# Patient Record
Sex: Female | Born: 1940
Health system: Southern US, Community
[De-identification: ages and names within clinical notes are randomized; demographics above are authoritative.]

## PROBLEM LIST (undated history)

## (undated) DIAGNOSIS — M549 Dorsalgia, unspecified: Secondary | ICD-10-CM

## (undated) DIAGNOSIS — I2510 Atherosclerotic heart disease of native coronary artery without angina pectoris: Secondary | ICD-10-CM

## (undated) DIAGNOSIS — R600 Localized edema: Secondary | ICD-10-CM

## (undated) DIAGNOSIS — R0600 Dyspnea, unspecified: Secondary | ICD-10-CM

## (undated) DIAGNOSIS — I639 Cerebral infarction, unspecified: Secondary | ICD-10-CM

## (undated) DIAGNOSIS — Z9289 Personal history of other medical treatment: Secondary | ICD-10-CM

## (undated) DIAGNOSIS — E039 Hypothyroidism, unspecified: Secondary | ICD-10-CM

## (undated) DIAGNOSIS — R1310 Dysphagia, unspecified: Secondary | ICD-10-CM

## (undated) DIAGNOSIS — I509 Heart failure, unspecified: Secondary | ICD-10-CM

## (undated) DIAGNOSIS — I5042 Chronic combined systolic (congestive) and diastolic (congestive) heart failure: Secondary | ICD-10-CM

## (undated) DIAGNOSIS — G4730 Sleep apnea, unspecified: Secondary | ICD-10-CM

## (undated) DIAGNOSIS — K219 Gastro-esophageal reflux disease without esophagitis: Secondary | ICD-10-CM

## (undated) DIAGNOSIS — I5030 Unspecified diastolic (congestive) heart failure: Secondary | ICD-10-CM

## (undated) DIAGNOSIS — G459 Transient cerebral ischemic attack, unspecified: Secondary | ICD-10-CM

## (undated) DIAGNOSIS — N289 Disorder of kidney and ureter, unspecified: Secondary | ICD-10-CM

## (undated) DIAGNOSIS — E079 Disorder of thyroid, unspecified: Secondary | ICD-10-CM

## (undated) DIAGNOSIS — I519 Heart disease, unspecified: Secondary | ICD-10-CM

## (undated) DIAGNOSIS — E119 Type 2 diabetes mellitus without complications: Secondary | ICD-10-CM

## (undated) DIAGNOSIS — M2550 Pain in unspecified joint: Secondary | ICD-10-CM

## (undated) DIAGNOSIS — I10 Essential (primary) hypertension: Secondary | ICD-10-CM

## (undated) HISTORY — DX: Dysphagia, unspecified: R13.10

## (undated) HISTORY — PX: JOINT REPLACEMENT: SHX530

## (undated) HISTORY — DX: Hypothyroidism, unspecified: E03.9

## (undated) HISTORY — DX: Gastro-esophageal reflux disease without esophagitis: K21.9

## (undated) HISTORY — PX: ESOPHAGOGASTRODUODENOSCOPY: SHX1529

## (undated) HISTORY — DX: Transient cerebral ischemic attack, unspecified: G45.9

## (undated) HISTORY — DX: Localized edema: R60.0

## (undated) HISTORY — PX: ABDOMINAL HYSTERECTOMY: SHX81

## (undated) HISTORY — DX: Disorder of kidney and ureter, unspecified: N28.9

## (undated) HISTORY — PX: HAND SURGERY: SHX662

## (undated) HISTORY — PX: THYROIDECTOMY: SHX17

## (undated) HISTORY — DX: Heart failure, unspecified: I50.9

## (undated) HISTORY — DX: Chronic combined systolic (congestive) and diastolic (congestive) heart failure: I50.42

## (undated) HISTORY — DX: Dyspnea, unspecified: R06.00

## (undated) HISTORY — DX: Cerebral infarction, unspecified: I63.9

## (undated) HISTORY — DX: Dorsalgia, unspecified: M54.9

## (undated) HISTORY — PX: CARDIAC CATHETERIZATION: SHX172

## (undated) HISTORY — DX: Pain in unspecified joint: M25.50

## (undated) HISTORY — DX: Heart disease, unspecified: I51.9

---

## 2013-02-27 DIAGNOSIS — M159 Polyosteoarthritis, unspecified: Secondary | ICD-10-CM | POA: Insufficient documentation

## 2013-02-27 DIAGNOSIS — M1990 Unspecified osteoarthritis, unspecified site: Secondary | ICD-10-CM | POA: Insufficient documentation

## 2013-02-28 DIAGNOSIS — R5381 Other malaise: Secondary | ICD-10-CM | POA: Insufficient documentation

## 2013-05-29 ENCOUNTER — Other Ambulatory Visit: Payer: Self-pay | Admitting: *Deleted

## 2013-05-29 DIAGNOSIS — R51 Headache: Secondary | ICD-10-CM

## 2013-05-29 DIAGNOSIS — M542 Cervicalgia: Secondary | ICD-10-CM

## 2013-05-30 ENCOUNTER — Other Ambulatory Visit: Payer: Self-pay

## 2013-05-30 ENCOUNTER — Ambulatory Visit
Admission: RE | Admit: 2013-05-30 | Discharge: 2013-05-30 | Disposition: A | Discharge status: Home / Self Care | Payer: Medicare Other | Source: Ambulatory Visit | Attending: *Deleted | Admitting: *Deleted

## 2013-05-30 DIAGNOSIS — R51 Headache: Secondary | ICD-10-CM

## 2013-05-30 DIAGNOSIS — M542 Cervicalgia: Secondary | ICD-10-CM

## 2013-06-05 ENCOUNTER — Other Ambulatory Visit: Payer: Self-pay

## 2013-10-29 ENCOUNTER — Other Ambulatory Visit: Payer: Self-pay | Admitting: Rheumatology

## 2013-10-29 ENCOUNTER — Ambulatory Visit
Admission: RE | Admit: 2013-10-29 | Discharge: 2013-10-29 | Disposition: A | Discharge status: Home / Self Care | Payer: Medicare Other | Source: Ambulatory Visit | Attending: Rheumatology | Admitting: Rheumatology

## 2013-10-29 DIAGNOSIS — M545 Low back pain, unspecified: Secondary | ICD-10-CM

## 2013-12-11 ENCOUNTER — Other Ambulatory Visit: Payer: Self-pay | Admitting: Rheumatology

## 2013-12-11 DIAGNOSIS — M545 Low back pain, unspecified: Secondary | ICD-10-CM

## 2013-12-20 ENCOUNTER — Ambulatory Visit
Admission: RE | Admit: 2013-12-20 | Discharge: 2013-12-20 | Disposition: A | Discharge status: Home / Self Care | Payer: Medicare Other | Source: Ambulatory Visit | Attending: Rheumatology | Admitting: Rheumatology

## 2013-12-20 DIAGNOSIS — M545 Low back pain, unspecified: Secondary | ICD-10-CM

## 2014-01-29 ENCOUNTER — Encounter (HOSPITAL_COMMUNITY): Payer: Self-pay | Admitting: Emergency Medicine

## 2014-01-29 ENCOUNTER — Emergency Department (INDEPENDENT_AMBULATORY_CARE_PROVIDER_SITE_OTHER): Payer: Medicare Other

## 2014-01-29 ENCOUNTER — Emergency Department (HOSPITAL_COMMUNITY)
Admission: EM | Admit: 2014-01-29 | Discharge: 2014-01-29 | Disposition: A | Discharge status: Home / Self Care | Payer: Medicare Other | Source: Home / Self Care | Attending: Emergency Medicine | Admitting: Emergency Medicine

## 2014-01-29 DIAGNOSIS — R1110 Vomiting, unspecified: Secondary | ICD-10-CM

## 2014-01-29 HISTORY — DX: Sleep apnea, unspecified: G47.30

## 2014-01-29 HISTORY — DX: Disorder of thyroid, unspecified: E07.9

## 2014-01-29 HISTORY — DX: Type 2 diabetes mellitus without complications: E11.9

## 2014-01-29 HISTORY — DX: Essential (primary) hypertension: I10

## 2014-01-29 LAB — POCT URINALYSIS DIP (DEVICE)
Bilirubin Urine: NEGATIVE
Glucose, UA: NEGATIVE mg/dL
Hgb urine dipstick: NEGATIVE
Ketones, ur: NEGATIVE mg/dL
Leukocytes, UA: NEGATIVE
Nitrite: NEGATIVE
Protein, ur: NEGATIVE mg/dL
Specific Gravity, Urine: 1.015 (ref 1.005–1.030)
Urobilinogen, UA: 0.2 mg/dL (ref 0.0–1.0)
pH: 6.5 (ref 5.0–8.0)

## 2014-01-29 LAB — OCCULT BLOOD, POC DEVICE: Fecal Occult Bld: NEGATIVE

## 2014-01-29 MED ORDER — ONDANSETRON HCL 4 MG PO TABS: 4.0000 mg | ORAL_TABLET | Freq: Three times a day (TID) | ORAL | Status: DC | PRN

## 2014-01-29 NOTE — ED Notes (Signed)
C/o 3-4 week duration of general abdominal pain w n/v . Had 3 episodes last PM. Denies pain at present, NAD

## 2014-01-29 NOTE — ED Provider Notes (Signed)
CSN: [id number]     Arrival date & time 01/29/14  1356 History   None    Chief Complaint  Patient presents with  . GI Problem   (Consider location/radiation/quality/duration/timing/severity/associated sxs/prior Treatment) HPI Comments: Patient is here in town visiting her daughter and presents to [facility] for GI issue that has occurred intermittently over the past one month. Patient reports that on four separate occasions, including the most recent episode last night, she had go to the restroom to have a bowel movement and as she is moving her bowels, she becomes very nauseated and then vomits.  Mentions having an occasional dark stool over past one month and periodic dysuria.  No fever, flank pain, diarrhea, constipation, or hematemesis. No hematuria, frequency or urgency.   Patient is a 73 y.o. female presenting with GI illness. The history is provided by the patient and a relative.  GI Problem Associated symptoms include abdominal pain. Pertinent negatives include no headaches.    Past Medical History  Diagnosis Date  . Diabetes mellitus without complication   . Hypertension   . Sleep apnea   . Thyroid disease    Past Surgical History  Procedure Laterality Date  . Abdominal hysterectomy    . Thyroidectomy     History reviewed. No pertinent family history. History  Substance Use Topics  . Smoking status: Never Smoker   . Smokeless tobacco: Not on file  . Alcohol Use: No   OB History   Grav Para Term Preterm Abortions TAB SAB Ect Mult Living                 Review of Systems  Constitutional: Negative.   HENT: Negative.   Eyes: Negative.   Respiratory: Negative.   Cardiovascular: Negative.   Gastrointestinal: Positive for nausea, vomiting, abdominal pain and blood in stool. Negative for diarrhea, constipation, abdominal distention, anal bleeding and rectal pain.  Genitourinary: Positive for dysuria. Negative for urgency, frequency, hematuria, flank pain, vaginal bleeding,  vaginal discharge, difficulty urinating, vaginal pain and pelvic pain.  Musculoskeletal: Negative.   Skin: Negative.   Neurological: Negative for headaches.    Allergies  Ace inhibitors; Amlodipine; Atenolol; Avandia; Darvon; Erythromycin; Hydrocodone; Percocet; and Spironolactone  Home Medications   Prior to Admission medications   Not on File   BP 178/78  Pulse 61  Temp(Src) 98 F (36.7 C) (Oral)  Resp 18  SpO2 98% Physical Exam  Nursing note and vitals reviewed. Constitutional: She is oriented to person, place, and time. She appears well-developed and well-nourished.  +obese  HENT:  Head: Normocephalic and atraumatic.  Eyes: Conjunctivae are normal. No scleral icterus.  Cardiovascular: Normal rate, regular rhythm and normal heart sounds.   Pulmonary/Chest: Effort normal and breath sounds normal.  Abdominal: Soft. Bowel sounds are normal. She exhibits no distension and no mass. There is no tenderness. There is no rebound and no guarding.  Exam limited by body habitus  Genitourinary: Guaiac negative stool.  Musculoskeletal: Normal range of motion.  Neurological: She is alert and oriented to person, place, and time.  Skin: Skin is warm and dry.  Psychiatric: She has a normal mood and affect. Her behavior is normal.    ED Course  Procedures (including critical care time) Labs Review Labs Reviewed  OCCULT BLOOD X 1 CARD TO LAB, STOOL  POCT URINALYSIS DIP (DEVICE)  OCCULT BLOOD, POC DEVICE    Results for orders placed during the hospital encounter of 01/29/14  POCT URINALYSIS DIP (DEVICE)  Result Value Ref Range   Glucose, UA NEGATIVE  NEGATIVE mg/dL   Bilirubin Urine NEGATIVE  NEGATIVE   Ketones, ur NEGATIVE  NEGATIVE mg/dL   Specific Gravity, Urine 1.015  1.005 - 1.030   Hgb urine dipstick NEGATIVE  NEGATIVE   pH 6.5  5.0 - 8.0   Protein, ur NEGATIVE  NEGATIVE mg/dL   Urobilinogen, UA 0.2  0.0 - 1.0 mg/dL   Nitrite NEGATIVE  NEGATIVE   Leukocytes, UA  NEGATIVE  NEGATIVE  OCCULT BLOOD, POC DEVICE      Result Value Ref Range   Fecal Occult Bld NEGATIVE  NEGATIVE   Imaging Review Dg Abd 2 Views  01/29/2014   CLINICAL DATA:  Abdominal pain and vomiting  EXAM: ABDOMEN - 2 VIEW  COMPARISON:  10/29/2013  FINDINGS: Scattered large and small bowel gas is noted. Fecal material is noted throughout the colon. Mild degenerative changes of the lumbar spine are seen. No abnormal mass or abnormal calcifications are noted.  IMPRESSION: No acute abnormality noted.   Electronically Signed   By: Inez Catalina M.D.   On: 01/29/2014 16:20     MDM   1. Vomiting    UA normal. Abdominal films unremarkable. Will provide Rx for zofran and advise follow up with PCP.   [place], [place] 01/29/14 [phone]

## 2014-01-29 NOTE — Discharge Instructions (Signed)
On today's exam you had no evidence of blood in your stool, your urine studies were normal and your xrays were normal. You may use medication as prescribed for your nausea and vomiting should it reoccur. I would also like for you to follow up with your doctor once you return home to [place], [place].   Nausea and Vomiting Nausea is a sick feeling that often comes before throwing up (vomiting). Vomiting is a reflex where stomach contents come out of your mouth. Vomiting can cause severe loss of body fluids (dehydration). Children and elderly adults can become dehydrated quickly, especially if they also have diarrhea. Nausea and vomiting are symptoms of a condition or disease. It is important to find the cause of your symptoms. CAUSES   Direct irritation of the stomach lining. This irritation can result from increased acid production (gastroesophageal reflux disease), infection, food poisoning, taking certain medicines (such as nonsteroidal anti-inflammatory drugs), alcohol use, or tobacco use.  Signals from the brain.These signals could be caused by a headache, heat exposure, an inner ear disturbance, increased pressure in the brain from injury, infection, a tumor, or a concussion, pain, emotional stimulus, or metabolic problems.  An obstruction in the gastrointestinal tract (bowel obstruction).  Illnesses such as diabetes, hepatitis, gallbladder problems, appendicitis, kidney problems, cancer, sepsis, atypical symptoms of a heart attack, or eating disorders.  Medical treatments such as chemotherapy and radiation.  Receiving medicine that makes you sleep (general anesthetic) during surgery. DIAGNOSIS Your caregiver may ask for tests to be done if the problems do not improve after a few days. Tests may also be done if symptoms are severe or if the reason for the nausea and vomiting is not clear. Tests may include:  Urine tests.  Blood tests.  Stool tests.  Cultures (to look for evidence of  infection).  X-rays or other imaging studies. Test results can help your caregiver make decisions about treatment or the need for additional tests. TREATMENT You need to stay well hydrated. Drink frequently but in small amounts.You may wish to drink water, sports drinks, clear broth, or eat frozen ice pops or gelatin dessert to help stay hydrated.When you eat, eating slowly may help prevent nausea.There are also some antinausea medicines that may help prevent nausea. HOME CARE INSTRUCTIONS   Take all medicine as directed by your caregiver.  If you do not have an appetite, do not force yourself to eat. However, you must continue to drink fluids.  If you have an appetite, eat a normal diet unless your caregiver tells you differently.  Eat a variety of complex carbohydrates (rice, wheat, potatoes, bread), lean meats, yogurt, fruits, and vegetables.  Avoid high-fat foods because they are more difficult to digest.  Drink enough water and fluids to keep your urine clear or pale yellow.  If you are dehydrated, ask your caregiver for specific rehydration instructions. Signs of dehydration may include:  Severe thirst.  Dry lips and mouth.  Dizziness.  Dark urine.  Decreasing urine frequency and amount.  Confusion.  Rapid breathing or pulse. SEEK IMMEDIATE MEDICAL CARE IF:   You have blood or brown flecks (like coffee grounds) in your vomit.  You have black or bloody stools.  You have a severe headache or stiff neck.  You are confused.  You have severe abdominal pain.  You have chest pain or trouble breathing.  You do not urinate at least once every 8 hours.  You develop cold or clammy skin.  You continue to vomit for longer than  24 to 48 hours.  You have a fever. MAKE SURE YOU:   Understand these instructions.  Will watch your condition.  Will get help right away if you are not doing well or get worse. Document Released: 10/03/2005 Document Revised: 12/26/2011  Document Reviewed: 03/02/2011 [facility] Patient Information 2014 [place], [place].

## 2014-01-30 NOTE — ED Provider Notes (Signed)
Medical screening examination/treatment/procedure(s) were performed by non-physician practitioner and as supervising physician I was immediately available for consultation/collaboration.  Philipp Deputy, M.D.   Harden Mo, MD 01/30/14 [phone]

## 2014-10-02 DIAGNOSIS — I2510 Atherosclerotic heart disease of native coronary artery without angina pectoris: Secondary | ICD-10-CM | POA: Insufficient documentation

## 2015-01-16 ENCOUNTER — Emergency Department (HOSPITAL_COMMUNITY): Payer: Medicare Other

## 2015-01-16 ENCOUNTER — Other Ambulatory Visit (HOSPITAL_COMMUNITY): Payer: Self-pay

## 2015-01-16 ENCOUNTER — Inpatient Hospital Stay (HOSPITAL_COMMUNITY)
Admission: EM | Admit: 2015-01-16 | Discharge: 2015-01-20 | DRG: 287 | Disposition: A | Discharge status: Home / Self Care | Payer: Medicare Other | Attending: Internal Medicine | Admitting: Internal Medicine

## 2015-01-16 ENCOUNTER — Encounter (HOSPITAL_COMMUNITY): Payer: Self-pay

## 2015-01-16 DIAGNOSIS — R0781 Pleurodynia: Secondary | ICD-10-CM

## 2015-01-16 DIAGNOSIS — R1310 Dysphagia, unspecified: Secondary | ICD-10-CM | POA: Diagnosis present

## 2015-01-16 DIAGNOSIS — Z7982 Long term (current) use of aspirin: Secondary | ICD-10-CM

## 2015-01-16 DIAGNOSIS — I129 Hypertensive chronic kidney disease with stage 1 through stage 4 chronic kidney disease, or unspecified chronic kidney disease: Secondary | ICD-10-CM | POA: Diagnosis present

## 2015-01-16 DIAGNOSIS — I25110 Atherosclerotic heart disease of native coronary artery with unstable angina pectoris: Secondary | ICD-10-CM | POA: Diagnosis not present

## 2015-01-16 DIAGNOSIS — N183 Chronic kidney disease, stage 3 unspecified: Secondary | ICD-10-CM | POA: Diagnosis not present

## 2015-01-16 DIAGNOSIS — K219 Gastro-esophageal reflux disease without esophagitis: Secondary | ICD-10-CM | POA: Diagnosis present

## 2015-01-16 DIAGNOSIS — Z79899 Other long term (current) drug therapy: Secondary | ICD-10-CM

## 2015-01-16 DIAGNOSIS — Z90710 Acquired absence of both cervix and uterus: Secondary | ICD-10-CM

## 2015-01-16 DIAGNOSIS — I2510 Atherosclerotic heart disease of native coronary artery without angina pectoris: Secondary | ICD-10-CM | POA: Diagnosis present

## 2015-01-16 DIAGNOSIS — E039 Hypothyroidism, unspecified: Secondary | ICD-10-CM | POA: Diagnosis present

## 2015-01-16 DIAGNOSIS — E890 Postprocedural hypothyroidism: Secondary | ICD-10-CM | POA: Diagnosis present

## 2015-01-16 DIAGNOSIS — E119 Type 2 diabetes mellitus without complications: Secondary | ICD-10-CM | POA: Diagnosis present

## 2015-01-16 DIAGNOSIS — K5900 Constipation, unspecified: Secondary | ICD-10-CM | POA: Diagnosis present

## 2015-01-16 DIAGNOSIS — Z9989 Dependence on other enabling machines and devices: Secondary | ICD-10-CM

## 2015-01-16 DIAGNOSIS — Z23 Encounter for immunization: Secondary | ICD-10-CM

## 2015-01-16 DIAGNOSIS — N179 Acute kidney failure, unspecified: Secondary | ICD-10-CM | POA: Diagnosis present

## 2015-01-16 DIAGNOSIS — Z888 Allergy status to other drugs, medicaments and biological substances status: Secondary | ICD-10-CM

## 2015-01-16 DIAGNOSIS — Z6841 Body Mass Index (BMI) 40.0 and over, adult: Secondary | ICD-10-CM

## 2015-01-16 DIAGNOSIS — Z8249 Family history of ischemic heart disease and other diseases of the circulatory system: Secondary | ICD-10-CM

## 2015-01-16 DIAGNOSIS — R0789 Other chest pain: Secondary | ICD-10-CM

## 2015-01-16 DIAGNOSIS — Z885 Allergy status to narcotic agent status: Secondary | ICD-10-CM

## 2015-01-16 DIAGNOSIS — Z881 Allergy status to other antibiotic agents status: Secondary | ICD-10-CM

## 2015-01-16 DIAGNOSIS — M549 Dorsalgia, unspecified: Secondary | ICD-10-CM | POA: Diagnosis present

## 2015-01-16 DIAGNOSIS — G4733 Obstructive sleep apnea (adult) (pediatric): Secondary | ICD-10-CM | POA: Diagnosis present

## 2015-01-16 DIAGNOSIS — I10 Essential (primary) hypertension: Secondary | ICD-10-CM | POA: Diagnosis present

## 2015-01-16 DIAGNOSIS — D649 Anemia, unspecified: Secondary | ICD-10-CM | POA: Diagnosis present

## 2015-01-16 HISTORY — DX: Atherosclerotic heart disease of native coronary artery without angina pectoris: I25.10

## 2015-01-16 HISTORY — DX: Personal history of other medical treatment: Z92.89

## 2015-01-16 LAB — COMPREHENSIVE METABOLIC PANEL
ALT: 19 U/L (ref 0–35)
AST: 23 U/L (ref 0–37)
Albumin: 3.6 g/dL (ref 3.5–5.2)
Alkaline Phosphatase: 84 U/L (ref 39–117)
Anion gap: 7 (ref 5–15)
BUN: 33 mg/dL — ABNORMAL HIGH (ref 6–23)
CO2: 26 mmol/L (ref 19–32)
Calcium: 9.1 mg/dL (ref 8.4–10.5)
Chloride: 105 mmol/L (ref 96–112)
Creatinine, Ser: 1.74 mg/dL — ABNORMAL HIGH (ref 0.50–1.10)
GFR calc Af Amer: 33 mL/min — ABNORMAL LOW (ref >90)
GFR calc non Af Amer: 28 mL/min — ABNORMAL LOW (ref >90)
Glucose, Bld: 136 mg/dL — ABNORMAL HIGH (ref 70–99)
Potassium: 4.1 mmol/L (ref 3.5–5.1)
Sodium: 138 mmol/L (ref 135–145)
Total Bilirubin: 0.3 mg/dL (ref 0.3–1.2)
Total Protein: 7.6 g/dL (ref 6.0–8.3)

## 2015-01-16 LAB — CBC
HCT: 32.3 % — ABNORMAL LOW (ref 36.0–46.0)
HCT: 30.9 % — ABNORMAL LOW (ref 36.0–46.0)
Hemoglobin: 10.3 g/dL — ABNORMAL LOW (ref 12.0–15.0)
Hemoglobin: 10.1 g/dL — ABNORMAL LOW (ref 12.0–15.0)
MCH: 27 pg (ref 26.0–34.0)
MCH: 27.8 pg (ref 26.0–34.0)
MCHC: 31.9 g/dL (ref 30.0–36.0)
MCHC: 32.7 g/dL (ref 30.0–36.0)
MCV: 84.8 fL (ref 78.0–100.0)
MCV: 85.1 fL (ref 78.0–100.0)
Platelets: 332 10*3/uL (ref 150–400)
Platelets: 291 10*3/uL (ref 150–400)
RBC: 3.81 MIL/uL — ABNORMAL LOW (ref 3.87–5.11)
RBC: 3.63 MIL/uL — ABNORMAL LOW (ref 3.87–5.11)
RDW: 14.3 % (ref 11.5–15.5)
RDW: 14.5 % (ref 11.5–15.5)
WBC: 6.1 10*3/uL (ref 4.0–10.5)
WBC: 7.7 10*3/uL (ref 4.0–10.5)

## 2015-01-16 LAB — CREATININE, SERUM
Creatinine, Ser: 1.72 mg/dL — ABNORMAL HIGH (ref 0.50–1.10)
GFR calc Af Amer: 33 mL/min — ABNORMAL LOW (ref >90)
GFR calc non Af Amer: 29 mL/min — ABNORMAL LOW (ref >90)

## 2015-01-16 LAB — MRSA PCR SCREENING: MRSA by PCR: NEGATIVE

## 2015-01-16 LAB — BRAIN NATRIURETIC PEPTIDE: B Natriuretic Peptide: 333.5 pg/mL — ABNORMAL HIGH (ref 0.0–100.0)

## 2015-01-16 LAB — TSH: TSH: 1.926 u[IU]/mL (ref 0.350–4.500)

## 2015-01-16 LAB — TROPONIN I
Troponin I: 0.06 ng/mL — ABNORMAL HIGH (ref <0.031)
Troponin I: 0.06 ng/mL — ABNORMAL HIGH (ref <0.031)

## 2015-01-16 LAB — I-STAT TROPONIN, ED: Troponin i, poc: 0.03 ng/mL (ref 0.00–0.08)

## 2015-01-16 LAB — GLUCOSE, CAPILLARY: Glucose-Capillary: 151 mg/dL — ABNORMAL HIGH (ref 70–99)

## 2015-01-16 MED ORDER — TERAZOSIN HCL 5 MG PO CAPS
5.0000 mg | ORAL_CAPSULE | Freq: Every day | ORAL | Status: DC
  Administered 2015-01-16 – 2015-01-19 (×4): 5 mg via ORAL
  Filled 2015-01-16 (×5): qty 1

## 2015-01-16 MED ORDER — NITROGLYCERIN 0.4 MG SL SUBL
0.4000 mg | SUBLINGUAL_TABLET | SUBLINGUAL | Status: DC | PRN
  Administered 2015-01-17: 0.4 mg via SUBLINGUAL
  Filled 2015-01-16: qty 1

## 2015-01-16 MED ORDER — ISOSORBIDE MONONITRATE ER 30 MG PO TB24
30.0000 mg | ORAL_TABLET | Freq: Every day | ORAL | Status: DC
Start: 2015-01-16 — End: 2015-01-19
  Administered 2015-01-16 – 2015-01-19 (×4): 30 mg via ORAL
  Filled 2015-01-16 (×4): qty 1

## 2015-01-16 MED ORDER — ASPIRIN 81 MG PO CHEW
81.0000 mg | CHEWABLE_TABLET | Freq: Every day | ORAL | Status: DC
  Administered 2015-01-17 – 2015-01-20 (×4): 81 mg via ORAL
  Filled 2015-01-16 (×4): qty 1

## 2015-01-16 MED ORDER — NITROGLYCERIN 2 % TD OINT
1.0000 [in_us] | TOPICAL_OINTMENT | Freq: Four times a day (QID) | TRANSDERMAL | Status: DC
  Filled 2015-01-16: qty 1

## 2015-01-16 MED ORDER — HEPARIN BOLUS VIA INFUSION
4000.0000 [IU] | Freq: Once | INTRAVENOUS | Status: AC
  Administered 2015-01-16: 4000 [IU] via INTRAVENOUS
  Filled 2015-01-16: qty 4000

## 2015-01-16 MED ORDER — PANTOPRAZOLE SODIUM 40 MG PO TBEC
40.0000 mg | DELAYED_RELEASE_TABLET | Freq: Every day | ORAL | Status: DC
  Administered 2015-01-16 – 2015-01-20 (×5): 40 mg via ORAL
  Filled 2015-01-16 (×5): qty 1

## 2015-01-16 MED ORDER — ONDANSETRON HCL 4 MG/2ML IJ SOLN
4.0000 mg | Freq: Four times a day (QID) | INTRAMUSCULAR | Status: DC | PRN
  Administered 2015-01-17 (×2): 4 mg via INTRAVENOUS
  Filled 2015-01-16 (×2): qty 2

## 2015-01-16 MED ORDER — HEPARIN BOLUS VIA INFUSION: 4000.0000 [IU] | Freq: Once | INTRAVENOUS | Status: DC

## 2015-01-16 MED ORDER — GABAPENTIN 400 MG PO CAPS
400.0000 mg | ORAL_CAPSULE | Freq: Every day | ORAL | Status: DC
  Administered 2015-01-16 – 2015-01-19 (×4): 400 mg via ORAL
  Filled 2015-01-16 (×4): qty 1

## 2015-01-16 MED ORDER — ACETAMINOPHEN 325 MG PO TABS
650.0000 mg | ORAL_TABLET | ORAL | Status: DC | PRN
  Administered 2015-01-16 – 2015-01-17 (×2): 650 mg via ORAL
  Filled 2015-01-16 (×2): qty 2

## 2015-01-16 MED ORDER — ASPIRIN 81 MG PO TABS
81.0000 mg | ORAL_TABLET | Freq: Every day | ORAL | Status: DC
Start: 2015-01-16 — End: 2015-01-16

## 2015-01-16 MED ORDER — LEVOTHYROXINE SODIUM 25 MCG PO TABS
125.0000 ug | ORAL_TABLET | Freq: Every day | ORAL | Status: DC
  Administered 2015-01-17 – 2015-01-20 (×4): 125 ug via ORAL
  Filled 2015-01-16 (×9): qty 1

## 2015-01-16 MED ORDER — INSULIN ASPART 100 UNIT/ML ~~LOC~~ SOLN
0.0000 [IU] | Freq: Three times a day (TID) | SUBCUTANEOUS | Status: DC
  Administered 2015-01-17: 3 [IU] via SUBCUTANEOUS
  Administered 2015-01-17: 5 [IU] via SUBCUTANEOUS
  Administered 2015-01-18 – 2015-01-19 (×3): 3 [IU] via SUBCUTANEOUS
  Administered 2015-01-19: 2 [IU] via SUBCUTANEOUS
  Administered 2015-01-20: 5 [IU] via SUBCUTANEOUS

## 2015-01-16 MED ORDER — INFLUENZA VAC SPLIT QUAD 0.5 ML IM SUSY
0.5000 mL | PREFILLED_SYRINGE | INTRAMUSCULAR | Status: AC
  Administered 2015-01-17: 0.5 mL via INTRAMUSCULAR
  Filled 2015-01-16 (×2): qty 0.5

## 2015-01-16 MED ORDER — MORPHINE SULFATE 4 MG/ML IJ SOLN
4.0000 mg | Freq: Once | INTRAMUSCULAR | Status: DC
Start: 2015-01-16 — End: 2015-01-17

## 2015-01-16 MED ORDER — HEPARIN (PORCINE) IN NACL 100-0.45 UNIT/ML-% IJ SOLN
1050.0000 [IU]/h | INTRAMUSCULAR | Status: DC
  Administered 2015-01-16: 1050 [IU]/h via INTRAVENOUS
  Filled 2015-01-16: qty 250

## 2015-01-16 MED ORDER — ACETAMINOPHEN 325 MG PO TABS
650.0000 mg | ORAL_TABLET | Freq: Once | ORAL | Status: AC
  Administered 2015-01-16: 650 mg via ORAL
  Filled 2015-01-16: qty 2

## 2015-01-16 MED ORDER — NITROGLYCERIN 0.4 MG SL SUBL: 0.4000 mg | SUBLINGUAL_TABLET | SUBLINGUAL | Status: DC | PRN

## 2015-01-16 MED ORDER — HEPARIN SODIUM (PORCINE) 5000 UNIT/ML IJ SOLN
5000.0000 [IU] | Freq: Three times a day (TID) | INTRAMUSCULAR | Status: DC
  Administered 2015-01-16 – 2015-01-18 (×7): 5000 [IU] via SUBCUTANEOUS
  Filled 2015-01-16 (×7): qty 1

## 2015-01-16 NOTE — ED Provider Notes (Signed)
CSN: [id number]     Arrival date & time 01/16/15  1243 History   First MD Initiated Contact with Patient 01/16/15 1313     Chief Complaint  Patient presents with  . Chest Pain     (Consider location/radiation/quality/duration/timing/severity/associated sxs/prior Treatment) HPI   74 year old female with history of diabetes, hypertension, and thyroid disease presents for evaluation of chest pain. Patient reports for the past 3 days she has had intermittent pleuritic chest pain. Pain is located across the mid chest, felt deep, worsening with movement or with coughing. She endorses occasional heart palpitation with heart racing and feeling lightheadedness. She also described chest pain as a pressure-like sensation. She endorsed nausea without vomiting. Her pain is currently 5 out of 10. Improves when she sits still. She has been taking 3 baby aspirin last night and today have provided minimal improvement to her chest discomfort. She denies any fever, URI symptoms, productive cough, hemoptysis, abdominal pain, back pain, vomiting or diarrhea, or rash. She denies any recent strenuous activities. She reported that her heart doctor is located in [place]. She felt that her last stress test approximately 2 years ago and was not told that there wasn't any significant finding.  Past Medical History  Diagnosis Date  . Diabetes mellitus without complication   . Hypertension   . Sleep apnea   . Thyroid disease    Past Surgical History  Procedure Laterality Date  . Abdominal hysterectomy    . Thyroidectomy     No family history on file. History  Substance Use Topics  . Smoking status: Never Smoker   . Smokeless tobacco: Not on file  . Alcohol Use: No   OB History    No data available     Review of Systems  All other systems reviewed and are negative.     Allergies  Ace inhibitors; Amlodipine; Atenolol; Avandia; Darvon; Erythromycin; Hydrocodone; Percocet; and Spironolactone  Home  Medications   Prior to Admission medications   Medication Sig Start Date End Date Taking? Authorizing Provider  aspirin 81 MG tablet Take 81 mg by mouth daily.    Historical Provider, MD  furosemide (LASIX) 40 MG tablet Take 40 mg by mouth.    Historical Provider, MD  gabapentin (NEURONTIN) 400 MG capsule Take 400 mg by mouth 3 (three) times daily.    Historical Provider, MD  levothyroxine (SYNTHROID, LEVOTHROID) 125 MCG tablet Take 125 mcg by mouth daily before breakfast.    Historical Provider, MD  losartan (COZAAR) 100 MG tablet Take 100 mg by mouth daily.    Historical Provider, MD  meclizine (ANTIVERT) 25 MG tablet Take 25 mg by mouth 3 (three) times daily as needed for dizziness.    Historical Provider, MD  omeprazole (PRILOSEC) 20 MG capsule Take 20 mg by mouth daily.    Historical Provider, MD  ondansetron (ZOFRAN) 4 MG tablet Take 1 tablet (4 mg total) by mouth every 8 (eight) hours as needed for nausea or vomiting. 01/29/14   Audelia Hives Presson, PA  terazosin (HYTRIN) 5 MG capsule Take 5 mg by mouth at bedtime.    Historical Provider, MD  Vitamin D, Ergocalciferol, (DRISDOL) 50000 UNITS CAPS capsule Take 50,000 Units by mouth every 7 (seven) days.    Historical Provider, MD   BP 171/83 mmHg  Pulse 81  Temp(Src) 98.8 F (37.1 C) (Oral)  Resp 18  SpO2 100% Physical Exam  Constitutional: She is oriented to person, place, and time. She appears well-developed and well-nourished. No distress.  Awake, alert, nontoxic appearance  HENT:  Head: Atraumatic.  Eyes: Conjunctivae are normal. Right eye exhibits no discharge. Left eye exhibits no discharge.  Neck: Neck supple.  Cardiovascular: Normal rate, regular rhythm and intact distal pulses.   Pulmonary/Chest: Effort normal and breath sounds normal. No respiratory distress. She exhibits no tenderness.  Abdominal: Soft. There is no tenderness. There is no rebound.  Musculoskeletal: Normal range of motion. She exhibits no edema or  tenderness.  ROM appears intact, no obvious focal weakness  Neurological: She is alert and oriented to person, place, and time.  Mental status and motor strength appears intact  Skin: Skin is warm. No rash noted.  Psychiatric: She has a normal mood and affect.  Nursing note and vitals reviewed.   ED Course  Procedures (including critical care time)  Patient presents with chest pain. Symptoms has been ongoing for 3 days. She is currently afebrile with stable vital sign.  Work up initiated.  Care discussed with Dr. Mingo Amber.    2:55 PM ECG without acute ischemic changes.  istat trop negative, however troponin I is elevated at 0.06.  Elevated BNP of 333.5, evidence of renal insufficiency with BUN 33, Cr 1.74. CXR unremarkable. Does have active CP.  Will give SL nitro, morphine and initiate heparin.  Care discussed with Dr. Mingo Amber.  3:08 PM i have consult cardiology who agrees to evaluate pt in ER.    4:26 PM Appreciate consult from Cardiology who will admit pt for further evaluation of her chest pain.    Labs Review Labs Reviewed  CBC - Abnormal; Notable for the following:    RBC 3.81 (*)    Hemoglobin 10.3 (*)    HCT 32.3 (*)    All other components within normal limits  BRAIN NATRIURETIC PEPTIDE - Abnormal; Notable for the following:    B Natriuretic Peptide 333.5 (*)    All other components within normal limits  COMPREHENSIVE METABOLIC PANEL - Abnormal; Notable for the following:    Glucose, Bld 136 (*)    BUN 33 (*)    Creatinine, Ser 1.74 (*)    GFR calc non Af Amer 28 (*)    GFR calc Af Amer 33 (*)    All other components within normal limits  TROPONIN I - Abnormal; Notable for the following:    Troponin I 0.06 (*)    All other components within normal limits  HEPARIN LEVEL (UNFRACTIONATED)  I-STAT TROPOININ, ED    Imaging Review Dg Chest 2 View  01/16/2015   CLINICAL DATA:  Chest pain with radiation to left arm intermittently for 1 week.  EXAM: CHEST  2 VIEW   COMPARISON:  None.  FINDINGS: Lungs are clear. Heart is slightly enlarged with pulmonary vascularity within normal limits. No adenopathy. No pneumothorax. No bone lesions. There is degenerative change in the thoracic spine.  IMPRESSION: Mild cardiomegaly.  No edema or consolidation.   Electronically Signed   By: Lowella Grip III M.D.   On: 01/16/2015 13:31     EKG Interpretation None      Date: 01/16/2015  Rate: 82  Rhythm: normal sinus rhythm with premature supraventricular complexes  QRS Axis: normal  Intervals: normal  ST/T Wave abnormalities: normal  Conduction Disutrbances: none  Narrative Interpretation:   Old EKG Reviewed: none for comparison     MDM   Final diagnoses:  Pleuritic chest pain    BP 159/43 mmHg  Pulse 65  Temp(Src) 98.8 F (37.1 C) (Oral)  Resp 16  Ht 5\' 2"  (1.575 m)  Wt 246 lb (111.585 kg)  BMI 44.98 kg/m2  SpO2 100%  I have reviewed nursing notes and vital signs. I personally reviewed the imaging tests through PACS system  I reviewed available ER/hospitalization records thought the EMR     Domenic Moras, PA-C 01/16/15 [place], MD 01/16/15 1705

## 2015-01-16 NOTE — ED Notes (Addendum)
Generalized chest pain began 3 days ago. Radiates into lt. Arm the pain is intermittent and movement increases the pain.  Sitting quietly decreases the pain  .   The pain is described as deep pain.   Skin is warm and dry.  Pt. Is having sob at times with mild nausea.  Pt. Does where a CPAP at home. ECG completed in Triage.  Pt. Also reports this am she felt palpatations.

## 2015-01-16 NOTE — Progress Notes (Signed)
Gave CHG bath via wipes, collected and sent MRSA swabs

## 2015-01-16 NOTE — Progress Notes (Signed)
Pt said she took three (3) 81mg  ASA at home prior to ED visit

## 2015-01-16 NOTE — Progress Notes (Signed)
ANTICOAGULATION CONSULT NOTE - Initial Consult  Pharmacy Consult for Heparin Indication: chest pain/ACS  Allergies  Allergen Reactions  . Ace Inhibitors   . Amlodipine   . Atenolol     bradycardia  . Avandia [Rosiglitazone]   . Darvon [Propoxyphene]   . Erythromycin Itching  . Hydralazine     Burning in throat and chest  . Hydrocodone   . Levofloxacin   . Percocet [Oxycodone-Acetaminophen]     hallucination  . Spironolactone   . Tramadol     unknown    Patient Measurements:  Ideal body weight: 50.1 kg  Heparin Dosing Weight: 78 kg   Vital Signs: Temp: 98.8 F (37.1 C) (04/01 1249) Temp Source: Oral (04/01 1249) BP: 150/59 mmHg (04/01 1445) Pulse Rate: 55 (04/01 1445)  Labs:  Recent Labs  01/16/15 1309  HGB 10.3*  HCT 32.3*  PLT 332  CREATININE 1.74*  TROPONINI 0.06*    CrCl cannot be calculated (Unknown ideal weight.).   Medical History: Past Medical History  Diagnosis Date  . Diabetes mellitus without complication   . Hypertension   . Sleep apnea   . Thyroid disease     Medications:   (Not in a hospital admission)  Assessment: 86 YOF who presented to the ED for chest pain x 3 days. ECG without acute ischemic changes. Initial troponin is mildly elevated at 0.06. H/H is low but Plt wnl. Unsure of baseline. Patient reports that she weighs ~ 246 lbs and that she has no history of bleeding. She was not on any anticoagulation prior to admission   Goal of Therapy:  Heparin level 0.3-0.7 units/ml Monitor platelets by anticoagulation protocol: Yes   Plan:  -Give heparin 4000 units bolus followed by heparin infusion at 1050 units/hr -F/u 8 hr HL  -Monitor daily HL, CBC and s/s of bleeding  Albertina Parr, PharmD., BCPS Clinical Pharmacist Pager [phone]

## 2015-01-16 NOTE — H&P (Signed)
Cardiologist:  [facility])  Regina [name] is an 74 y.o. female.   Chief Complaint: Chest Pain HPI:  The patient is a 74 yo female with a PMH of minimal-nonobstructive CAD by cath in 2011, CKD stage III, DM, HTN, OSA, hypothyroidism-SP thyroidectomy.  Her cardiologist is Dr. Benetta Spar who she saw this last December.  She is compliant with CPAP.  She reports CP for the last week.  She says it's worse when talking and with a breath/coughing.  Today it was 9/10.  Associated with nausea, SOB, diaphoresis, racing heart, dizzy, chills.  She had some LEE which is better.  She been having some hot flashes.  She denies orthopnea, abd pain, fever.   Medications:  Medication Sig  aspirin 81 MG tablet Take 81 mg by mouth daily.  Cholecalciferol (VITAMIN D3) 5000 UNITS CAPS Take 5,000 Units by mouth daily at 12 noon.  cyclobenzaprine (FLEXERIL) 10 MG tablet Take 10 mg by mouth at bedtime as needed for muscle spasms.   gabapentin (NEURONTIN) 400 MG capsule Take 400 mg by mouth at bedtime.   hydrochlorothiazide (HYDRODIURIL) 25 MG tablet Take 25 mg by mouth daily.  lactulose (CHRONULAC) 10 GM/15ML solution Take 20 g by mouth daily.  levothyroxine (SYNTHROID, LEVOTHROID) 125 MCG tablet Take 125 mcg by mouth daily before breakfast.  losartan (COZAAR) 100 MG tablet Take 100 mg by mouth daily.  meclizine (ANTIVERT) 25 MG tablet Take 25 mg by mouth 3 (three) times daily as needed for dizziness.  meloxicam (MOBIC) 15 MG tablet Take 15 mg by mouth daily at 12 noon.  omeprazole (PRILOSEC) 20 MG capsule Take 20 mg by mouth daily.  potassium chloride (K-DUR) 10 MEQ tablet Take 10 mEq by mouth 3 (three) times a week. Take on Monday, Wednesday, and Saturday  terazosin (HYTRIN) 5 MG capsule Take 5 mg by mouth at bedtime.  ondansetron (ZOFRAN) 4 MG tablet Take 1 tablet (4 mg total) by mouth every 8 (eight) hours as needed for nausea or vomiting. Patient not taking: Reported on 01/16/2015     Past  Medical History  Diagnosis Date  . Diabetes mellitus without complication   . Hypertension   . Sleep apnea   . Thyroid disease     Past Surgical History  Procedure Laterality Date  . Abdominal hysterectomy    . Thyroidectomy      No family history on file. Social History:  reports that she has never smoked. She does not have any smokeless tobacco history on file. She reports that she does not drink alcohol. Her drug history is not on file.  Allergies:  Allergies  Allergen Reactions  . Ace Inhibitors   . Amlodipine   . Atenolol     bradycardia  . Avandia [Rosiglitazone]   . Darvon [Propoxyphene]   . Erythromycin Itching  . Hydralazine     Burning in throat and chest  . Hydrocodone   . Levofloxacin   . Percocet [Oxycodone-Acetaminophen]     hallucination  . Spironolactone   . Tramadol     unknown     (Not in a hospital admission)  Results for orders placed or performed during the hospital encounter of 01/16/15 (from the past 48 hour(s))  CBC     Status: Abnormal   Collection Time: 01/16/15  1:09 PM  Result Value Ref Range   WBC 6.1 4.0 - 10.5 K/uL   RBC 3.81 (L) 3.87 - 5.11 MIL/uL   Hemoglobin 10.3 (L) 12.0 - 15.0 g/dL  HCT 32.3 (L) 36.0 - 46.0 %   MCV 84.8 78.0 - 100.0 fL   MCH 27.0 26.0 - 34.0 pg   MCHC 31.9 30.0 - 36.0 g/dL   RDW 14.3 11.5 - 15.5 %   Platelets 332 150 - 400 K/uL  Brain natriuretic peptide (order if patient c/o SOB ONLY)     Status: Abnormal   Collection Time: 01/16/15  1:09 PM  Result Value Ref Range   B Natriuretic Peptide 333.5 (H) 0.0 - 100.0 pg/mL  Comprehensive metabolic panel     Status: Abnormal   Collection Time: 01/16/15  1:09 PM  Result Value Ref Range   Sodium 138 135 - 145 mmol/L   Potassium 4.1 3.5 - 5.1 mmol/L   Chloride 105 96 - 112 mmol/L   CO2 26 19 - 32 mmol/L   Glucose, Bld 136 (H) 70 - 99 mg/dL   BUN 33 (H) 6 - 23 mg/dL   Creatinine, Ser 1.74 (H) 0.50 - 1.10 mg/dL   Calcium 9.1 8.4 - 10.5 mg/dL   Total Protein  7.6 6.0 - 8.3 g/dL   Albumin 3.6 3.5 - 5.2 g/dL   AST 23 0 - 37 U/L   ALT 19 0 - 35 U/L   Alkaline Phosphatase 84 39 - 117 U/L   Total Bilirubin 0.3 0.3 - 1.2 mg/dL   GFR calc non Af Amer 28 (L) >90 mL/min   GFR calc Af Amer 33 (L) >90 mL/min    Comment: (NOTE) The eGFR has been calculated using the CKD EPI equation. This calculation has not been validated in all clinical situations. eGFR's persistently <90 mL/min signify possible Chronic Kidney Disease.    Anion gap 7 5 - 15  Troponin I     Status: Abnormal   Collection Time: 01/16/15  1:09 PM  Result Value Ref Range   Troponin I 0.06 (H) <0.031 ng/mL    Comment:        PERSISTENTLY INCREASED TROPONIN VALUES IN THE RANGE OF 0.04-0.49 ng/mL CAN BE SEEN IN:       -UNSTABLE ANGINA       -CONGESTIVE HEART FAILURE       -MYOCARDITIS       -CHEST TRAUMA       -ARRYHTHMIAS       -LATE PRESENTING MYOCARDIAL INFARCTION       -COPD   CLINICAL FOLLOW-UP RECOMMENDED.   I-stat troponin, ED (not at [facility])     Status: None   Collection Time: 01/16/15  1:17 PM  Result Value Ref Range   Troponin i, poc 0.03 0.00 - 0.08 ng/mL   Comment 3            Comment: Due to the release kinetics of cTnI, a negative result within the first hours of the onset of symptoms does not rule out myocardial infarction with certainty. If myocardial infarction is still suspected, repeat the test at appropriate intervals.    Dg Chest 2 View  01/16/2015   CLINICAL DATA:  Chest pain with radiation to left arm intermittently for 1 week.  EXAM: CHEST  2 VIEW  COMPARISON:  None.  FINDINGS: Lungs are clear. Heart is slightly enlarged with pulmonary vascularity within normal limits. No adenopathy. No pneumothorax. No bone lesions. There is degenerative change in the thoracic spine.  IMPRESSION: Mild cardiomegaly.  No edema or consolidation.   Electronically Signed   By: Lowella Grip III M.D.   On: 01/16/2015 13:31    Review of Systems  Constitutional: Positive  for chills and diaphoresis. Negative for fever.  HENT: Negative for congestion and sore throat.   Respiratory: Positive for shortness of breath. Negative for cough and wheezing.   Cardiovascular: Positive for chest pain, palpitations and leg swelling. Negative for orthopnea and PND.  Gastrointestinal: Positive for nausea. Negative for vomiting, abdominal pain, blood in stool and melena.  Musculoskeletal: Positive for back pain.  Neurological: Positive for dizziness.  All other systems reviewed and are negative.   Blood pressure 159/43, pulse 65, temperature 98.8 F (37.1 C), temperature source Oral, resp. rate 16, height 5' 2"  (1.575 m), weight 246 lb (111.585 kg), SpO2 100 %. Physical Exam  Nursing note and vitals reviewed. Constitutional: She is oriented to person, place, and time. She appears well-developed. No distress.  Obese  HENT:  Head: Normocephalic and atraumatic.  Mouth/Throat: No oropharyngeal exudate.  Eyes: EOM are normal. Pupils are equal, round, and reactive to light. No scleral icterus.  Neck: Normal range of motion. Neck supple. No JVD present.  Cardiovascular: Normal rate, regular rhythm, S1 normal and S2 normal.  Exam reveals no friction rub.   No murmur heard. Pulses:      Radial pulses are 2+ on the right side, and 2+ on the left side.       Dorsalis pedis pulses are 2+ on the right side, and 2+ on the left side.  Respiratory: Effort normal and breath sounds normal. She exhibits tenderness (left toward shoulder).  GI: Soft. Bowel sounds are normal. She exhibits no distension. There is no tenderness.  Musculoskeletal: She exhibits no edema.  Lymphadenopathy:    She has no cervical adenopathy.  Neurological: She is alert and oriented to person, place, and time. She exhibits normal muscle tone.  Skin: Skin is warm and dry.  Psychiatric: She has a normal mood and affect.     Assessment/Plan Principal Problem:   Pleuritic chest pain Active Problems:   Acute  renal failure superimposed on stage 3 chronic kidney disease   Obesity, Class III, BMI 40-49.9 (morbid obesity)   Essential hypertension   Hypothyroidism   OSA on CPAP   DM type 2 (diabetes mellitus, type 2)   Plan Admit to stepdown as she is still having CP which is pleuritic and reproducible.  Will give ultram for pain.  Cycle troponin.  If it goes up significantly, then reevaluate need for ischemic WU.  Nothing for now besides echo.  Hydrate with 39m/HR NS for a liter.  Monitor I/O's.  Check echocardiogram.  Stop losartan and mobic due to worsening CKD.  Baseline appears to be ~1.50.  SS insulin, CBG ACHS.   CPAP tonight for OSA.    HTarri Fuller PLafayette General Medical Center4/10/2014, 3:16 PM  Patient seen and examined with DMelina Copa PA-C. We discussed all aspects of the encounter. I agree with the assessment and plan as stated above. CP likely costochrondritis. However troponin is minimally elevated. Will keep over night for pain control (we offered ultram but she says she just wants XS Tylenol) and rule out MI. Will check echo and Lexiscan Myoview.   DBenay Spice4:34 PM

## 2015-01-17 ENCOUNTER — Observation Stay (HOSPITAL_COMMUNITY): Payer: Medicare Other

## 2015-01-17 DIAGNOSIS — G4733 Obstructive sleep apnea (adult) (pediatric): Secondary | ICD-10-CM | POA: Diagnosis not present

## 2015-01-17 DIAGNOSIS — E890 Postprocedural hypothyroidism: Secondary | ICD-10-CM | POA: Diagnosis not present

## 2015-01-17 DIAGNOSIS — R0781 Pleurodynia: Secondary | ICD-10-CM | POA: Diagnosis present

## 2015-01-17 DIAGNOSIS — M549 Dorsalgia, unspecified: Secondary | ICD-10-CM | POA: Diagnosis not present

## 2015-01-17 DIAGNOSIS — I25110 Atherosclerotic heart disease of native coronary artery with unstable angina pectoris: Secondary | ICD-10-CM | POA: Diagnosis not present

## 2015-01-17 DIAGNOSIS — D649 Anemia, unspecified: Secondary | ICD-10-CM | POA: Diagnosis not present

## 2015-01-17 DIAGNOSIS — Z23 Encounter for immunization: Secondary | ICD-10-CM | POA: Diagnosis not present

## 2015-01-17 DIAGNOSIS — Z7982 Long term (current) use of aspirin: Secondary | ICD-10-CM | POA: Diagnosis not present

## 2015-01-17 DIAGNOSIS — Z885 Allergy status to narcotic agent status: Secondary | ICD-10-CM | POA: Diagnosis not present

## 2015-01-17 DIAGNOSIS — Z90710 Acquired absence of both cervix and uterus: Secondary | ICD-10-CM | POA: Diagnosis not present

## 2015-01-17 DIAGNOSIS — Z8249 Family history of ischemic heart disease and other diseases of the circulatory system: Secondary | ICD-10-CM | POA: Diagnosis not present

## 2015-01-17 DIAGNOSIS — K219 Gastro-esophageal reflux disease without esophagitis: Secondary | ICD-10-CM | POA: Diagnosis not present

## 2015-01-17 DIAGNOSIS — N179 Acute kidney failure, unspecified: Secondary | ICD-10-CM | POA: Diagnosis not present

## 2015-01-17 DIAGNOSIS — R9439 Abnormal result of other cardiovascular function study: Secondary | ICD-10-CM | POA: Diagnosis not present

## 2015-01-17 DIAGNOSIS — R079 Chest pain, unspecified: Secondary | ICD-10-CM

## 2015-01-17 DIAGNOSIS — I129 Hypertensive chronic kidney disease with stage 1 through stage 4 chronic kidney disease, or unspecified chronic kidney disease: Secondary | ICD-10-CM | POA: Diagnosis not present

## 2015-01-17 DIAGNOSIS — R1310 Dysphagia, unspecified: Secondary | ICD-10-CM | POA: Diagnosis not present

## 2015-01-17 DIAGNOSIS — K5900 Constipation, unspecified: Secondary | ICD-10-CM | POA: Diagnosis not present

## 2015-01-17 DIAGNOSIS — Z888 Allergy status to other drugs, medicaments and biological substances status: Secondary | ICD-10-CM | POA: Diagnosis not present

## 2015-01-17 DIAGNOSIS — Z881 Allergy status to other antibiotic agents status: Secondary | ICD-10-CM | POA: Diagnosis not present

## 2015-01-17 DIAGNOSIS — Z79899 Other long term (current) drug therapy: Secondary | ICD-10-CM | POA: Diagnosis not present

## 2015-01-17 DIAGNOSIS — N183 Chronic kidney disease, stage 3 (moderate): Secondary | ICD-10-CM | POA: Diagnosis not present

## 2015-01-17 DIAGNOSIS — Z6841 Body Mass Index (BMI) 40.0 and over, adult: Secondary | ICD-10-CM | POA: Diagnosis not present

## 2015-01-17 DIAGNOSIS — E119 Type 2 diabetes mellitus without complications: Secondary | ICD-10-CM | POA: Diagnosis not present

## 2015-01-17 LAB — GLUCOSE, CAPILLARY
Glucose-Capillary: 103 mg/dL — ABNORMAL HIGH (ref 70–99)
Glucose-Capillary: 161 mg/dL — ABNORMAL HIGH (ref 70–99)
Glucose-Capillary: 224 mg/dL — ABNORMAL HIGH (ref 70–99)
Glucose-Capillary: 126 mg/dL — ABNORMAL HIGH (ref 70–99)

## 2015-01-17 LAB — BASIC METABOLIC PANEL
Anion gap: 7 (ref 5–15)
BUN: 34 mg/dL — ABNORMAL HIGH (ref 6–23)
CO2: 26 mmol/L (ref 19–32)
Calcium: 8.4 mg/dL (ref 8.4–10.5)
Chloride: 105 mmol/L (ref 96–112)
Creatinine, Ser: 1.87 mg/dL — ABNORMAL HIGH (ref 0.50–1.10)
GFR calc Af Amer: 30 mL/min — ABNORMAL LOW (ref >90)
GFR calc non Af Amer: 26 mL/min — ABNORMAL LOW (ref >90)
Glucose, Bld: 114 mg/dL — ABNORMAL HIGH (ref 70–99)
Potassium: 4.3 mmol/L (ref 3.5–5.1)
Sodium: 138 mmol/L (ref 135–145)

## 2015-01-17 LAB — CBC
HCT: 28.1 % — ABNORMAL LOW (ref 36.0–46.0)
Hemoglobin: 9.3 g/dL — ABNORMAL LOW (ref 12.0–15.0)
MCH: 28 pg (ref 26.0–34.0)
MCHC: 33.1 g/dL (ref 30.0–36.0)
MCV: 84.6 fL (ref 78.0–100.0)
Platelets: 283 10*3/uL (ref 150–400)
RBC: 3.32 MIL/uL — ABNORMAL LOW (ref 3.87–5.11)
RDW: 14.5 % (ref 11.5–15.5)
WBC: 6.3 10*3/uL (ref 4.0–10.5)

## 2015-01-17 LAB — TROPONIN I
Troponin I: 0.06 ng/mL — ABNORMAL HIGH (ref <0.031)
Troponin I: 0.05 ng/mL — ABNORMAL HIGH (ref <0.031)

## 2015-01-17 LAB — LIPID PANEL
Cholesterol: 122 mg/dL (ref 0–200)
HDL: 39 mg/dL — ABNORMAL LOW (ref >39)
LDL Cholesterol: 56 mg/dL (ref 0–99)
Total CHOL/HDL Ratio: 3.1 RATIO
Triglycerides: 133 mg/dL (ref <150)
VLDL: 27 mg/dL (ref 0–40)

## 2015-01-17 MED ORDER — GI COCKTAIL ~~LOC~~
30.0000 mL | Freq: Once | ORAL | Status: AC
  Administered 2015-01-17: 30 mL via ORAL
  Filled 2015-01-17: qty 30

## 2015-01-17 MED ORDER — DICLOFENAC SODIUM 1 % TD GEL
4.0000 g | Freq: Four times a day (QID) | TRANSDERMAL | Status: DC | PRN
  Administered 2015-01-17: 4 g via TOPICAL
  Filled 2015-01-17: qty 100

## 2015-01-17 MED ORDER — MORPHINE SULFATE 2 MG/ML IJ SOLN
1.0000 mg | INTRAMUSCULAR | Status: DC | PRN
  Administered 2015-01-17 (×2): 1 mg via INTRAVENOUS
  Filled 2015-01-17 (×3): qty 1

## 2015-01-17 MED ORDER — TECHNETIUM TC 99M SESTAMIBI - CARDIOLITE
30.0000 | Freq: Once | INTRAVENOUS | Status: AC | PRN
  Administered 2015-01-17: 12:00:00 30 via INTRAVENOUS

## 2015-01-17 MED ORDER — REGADENOSON 0.4 MG/5ML IV SOLN
INTRAVENOUS | Status: AC
Start: 2015-01-17 — End: 2015-01-17
  Filled 2015-01-17: qty 5

## 2015-01-17 MED ORDER — TECHNETIUM TC 99M SESTAMIBI GENERIC - CARDIOLITE
10.0000 | Freq: Once | INTRAVENOUS | Status: AC | PRN
  Administered 2015-01-17: 10 via INTRAVENOUS

## 2015-01-17 MED ORDER — REGADENOSON 0.4 MG/5ML IV SOLN
0.4000 mg | Freq: Once | INTRAVENOUS | Status: AC
  Administered 2015-01-17: 0.4 mg via INTRAVENOUS
  Filled 2015-01-17: qty 5

## 2015-01-17 NOTE — Progress Notes (Signed)
Discussed with patient and her family regarding abnormal myoview result. Will tentatively place her on cath schedule on Monday. Will discuss again with attending in AM.  Her renal insufficiency will require IVF hydration prior to cath. Risk and benefit include bleeding, renal injury, vascular injury, arrhythmia, MI, stroke, loss of life or limb has been discussed with patient's daughter at the request of the patient who fears she cannot remember it all.  Hilbert Corrigan PA Pager: [phone]

## 2015-01-17 NOTE — Progress Notes (Signed)
Patient Name: Regina [name] Date of Encounter: 01/17/2015  Cardiologist: [place])   Principal Problem:   Pleuritic chest pain Active Problems:   Acute renal failure superimposed on stage 3 chronic kidney disease   Obesity, Class III, BMI 40-49.9 (morbid obesity)   Essential hypertension   Hypothyroidism   OSA on CPAP   DM type 2 (diabetes mellitus, type 2)    SUBJECTIVE  Continue to have intermittent CP, which has been going on for a week. Also started having R sided back pain since last night. No fever or chill, no burning sensation when urinate.  CURRENT MEDS . aspirin  81 mg Oral Daily  . gabapentin  400 mg Oral QHS  . heparin  5,000 Units Subcutaneous 3 times per day  . Influenza vac split quadrivalent PF  0.5 mL Intramuscular Tomorrow-1000  . insulin aspart  0-15 Units Subcutaneous TID WC  . isosorbide mononitrate  30 mg Oral Daily  . levothyroxine  125 mcg Oral QAC breakfast  .  morphine injection  4 mg Intravenous Once  . pantoprazole  40 mg Oral Daily  . terazosin  5 mg Oral QHS    OBJECTIVE  Filed Vitals:   01/16/15 2206 01/17/15 0024 01/17/15 0430 01/17/15 0810  BP:  138/51 138/46 159/72  Pulse: 66 57 67 67  Temp:  98.3 F (36.8 C) 98.4 F (36.9 C) 98 F (36.7 C)  TempSrc:  Axillary Axillary Oral  Resp: 17 18 18 18   Height:      Weight:   244 lb 6.4 oz (110.859 kg)   SpO2: 100% 99% 96% 98%   No intake or output data in the 24 hours ending 01/17/15 0950 Filed Weights   01/16/15 1500 01/16/15 2000 01/17/15 0430  Weight: 246 lb (111.585 kg) 244 lb 6.4 oz (110.859 kg) 244 lb 6.4 oz (110.859 kg)    PHYSICAL EXAM  General: Pleasant, NAD. Neuro: Alert and oriented X 3. Moves all extremities spontaneously. Psych: Normal affect. HEENT:  Normal  Neck: Supple without bruits or JVD. Lungs:  Resp regular and unlabored, CTA. Heart: RRR no s3, s4, or murmurs. Abdomen: Soft, non-tender, non-distended, BS + x 4.  Extremities: No  clubbing, cyanosis or edema. DP/PT/Radials 2+ and equal bilaterally.  Accessory Clinical Findings  CBC  Recent Labs  01/16/15 2018 01/17/15 0146  WBC 7.7 6.3  HGB 10.1* 9.3*  HCT 30.9* 28.1*  MCV 85.1 84.6  PLT 291 [id number]   Basic Metabolic Panel  Recent Labs  01/16/15 1309 01/16/15 2018 01/17/15 0146  NA 138  --  138  K 4.1  --  4.3  CL 105  --  105  CO2 26  --  26  GLUCOSE 136*  --  114*  BUN 33*  --  34*  CREATININE 1.74* 1.72* 1.87*  CALCIUM 9.1  --  8.4   Liver Function Tests  Recent Labs  01/16/15 1309  AST 23  ALT 19  ALKPHOS 84  BILITOT 0.3  PROT 7.6  ALBUMIN 3.6   Cardiac Enzymes  Recent Labs  01/16/15 2018 01/17/15 0146 01/17/15 0724  TROPONINI 0.06* 0.06* 0.05*   Fasting Lipid Panel  Recent Labs  01/17/15 0146  CHOL 122  HDL 39*  LDLCALC 56  TRIG 133  CHOLHDL 3.1   Thyroid Function Tests  Recent Labs  01/16/15 2018  TSH 1.926    TELE NSR with HR 50-60s    ECG  NSR with no significant ST-T wave changes  Echocardiogram  pending    Radiology/Studies  Dg Chest 2 View  01/16/2015   CLINICAL DATA:  Chest pain with radiation to left arm intermittently for 1 week.  EXAM: CHEST  2 VIEW  COMPARISON:  None.  FINDINGS: Lungs are clear. Heart is slightly enlarged with pulmonary vascularity within normal limits. No adenopathy. No pneumothorax. No bone lesions. There is degenerative change in the thoracic spine.  IMPRESSION: Mild cardiomegaly.  No edema or consolidation.   Electronically Signed   By: Lowella Grip III M.D.   On: 01/16/2015 13:31    ASSESSMENT AND PLAN  1. Atypical chest pain   - mildly elevated trop likely related to renal insufficiency  - will arrange inpatient myoview today  2. minimal-nonobstructive CAD by cath in 2011 3. CKD stage III 4. DM 5. HTN 6. OSA 7. hypothyroidism-SP thyroidectomy   Signed, Almyra Deforest PA-C Pager: [phone]   History and all data above reviewed.  Patient examined.  I  agree with the findings as above.  Still with chest pain  The patient exam reveals COR:RRR  ,  Lungs: Clear  ,  Abd: Positive bowel sounds, no rebound no guarding, Ext No edema  .  All available labs, radiology testing, previous records reviewed. Agree with documented assessment and plan. Doubt cardiac.  I will give a GI cocktail.  I will wait for the results of the [place].  Might need GI work up.    James Hochrein  1:00 PM  01/17/2015

## 2015-01-17 NOTE — Progress Notes (Signed)
Patient refuses CPAP, is dissatisfied with hospital provided machine and mask.  Patient uses nasal pillows at home.  Patient will have family member bring her machine from home for use tomorrow night.

## 2015-01-17 NOTE — Progress Notes (Signed)
Lexiscan stress test completed without complication. Pending final result by [facility] radiology  Signed, Almyra Deforest PA Pager: [phone]

## 2015-01-17 NOTE — Progress Notes (Signed)
UR completed 

## 2015-01-18 DIAGNOSIS — I2510 Atherosclerotic heart disease of native coronary artery without angina pectoris: Secondary | ICD-10-CM | POA: Diagnosis not present

## 2015-01-18 DIAGNOSIS — R0781 Pleurodynia: Secondary | ICD-10-CM | POA: Diagnosis present

## 2015-01-18 DIAGNOSIS — M549 Dorsalgia, unspecified: Secondary | ICD-10-CM | POA: Diagnosis present

## 2015-01-18 DIAGNOSIS — N179 Acute kidney failure, unspecified: Secondary | ICD-10-CM | POA: Diagnosis present

## 2015-01-18 DIAGNOSIS — E890 Postprocedural hypothyroidism: Secondary | ICD-10-CM | POA: Diagnosis present

## 2015-01-18 DIAGNOSIS — Z881 Allergy status to other antibiotic agents status: Secondary | ICD-10-CM | POA: Diagnosis not present

## 2015-01-18 DIAGNOSIS — Z7982 Long term (current) use of aspirin: Secondary | ICD-10-CM | POA: Diagnosis not present

## 2015-01-18 DIAGNOSIS — Z8249 Family history of ischemic heart disease and other diseases of the circulatory system: Secondary | ICD-10-CM | POA: Diagnosis not present

## 2015-01-18 DIAGNOSIS — K219 Gastro-esophageal reflux disease without esophagitis: Secondary | ICD-10-CM | POA: Diagnosis present

## 2015-01-18 DIAGNOSIS — R1310 Dysphagia, unspecified: Secondary | ICD-10-CM | POA: Diagnosis present

## 2015-01-18 DIAGNOSIS — Z6841 Body Mass Index (BMI) 40.0 and over, adult: Secondary | ICD-10-CM | POA: Diagnosis not present

## 2015-01-18 DIAGNOSIS — D649 Anemia, unspecified: Secondary | ICD-10-CM | POA: Diagnosis present

## 2015-01-18 DIAGNOSIS — Z79899 Other long term (current) drug therapy: Secondary | ICD-10-CM | POA: Diagnosis not present

## 2015-01-18 DIAGNOSIS — N183 Chronic kidney disease, stage 3 (moderate): Secondary | ICD-10-CM | POA: Diagnosis present

## 2015-01-18 DIAGNOSIS — Z23 Encounter for immunization: Secondary | ICD-10-CM | POA: Diagnosis not present

## 2015-01-18 DIAGNOSIS — N189 Chronic kidney disease, unspecified: Secondary | ICD-10-CM | POA: Diagnosis not present

## 2015-01-18 DIAGNOSIS — E119 Type 2 diabetes mellitus without complications: Secondary | ICD-10-CM | POA: Diagnosis present

## 2015-01-18 DIAGNOSIS — R9439 Abnormal result of other cardiovascular function study: Secondary | ICD-10-CM | POA: Diagnosis not present

## 2015-01-18 DIAGNOSIS — Z888 Allergy status to other drugs, medicaments and biological substances status: Secondary | ICD-10-CM | POA: Diagnosis not present

## 2015-01-18 DIAGNOSIS — I10 Essential (primary) hypertension: Secondary | ICD-10-CM | POA: Diagnosis not present

## 2015-01-18 DIAGNOSIS — I25110 Atherosclerotic heart disease of native coronary artery with unstable angina pectoris: Secondary | ICD-10-CM | POA: Diagnosis present

## 2015-01-18 DIAGNOSIS — I129 Hypertensive chronic kidney disease with stage 1 through stage 4 chronic kidney disease, or unspecified chronic kidney disease: Secondary | ICD-10-CM | POA: Diagnosis present

## 2015-01-18 DIAGNOSIS — K5900 Constipation, unspecified: Secondary | ICD-10-CM | POA: Diagnosis present

## 2015-01-18 DIAGNOSIS — Z885 Allergy status to narcotic agent status: Secondary | ICD-10-CM | POA: Diagnosis not present

## 2015-01-18 DIAGNOSIS — G4733 Obstructive sleep apnea (adult) (pediatric): Secondary | ICD-10-CM | POA: Diagnosis present

## 2015-01-18 DIAGNOSIS — R079 Chest pain, unspecified: Secondary | ICD-10-CM | POA: Diagnosis not present

## 2015-01-18 DIAGNOSIS — Z90710 Acquired absence of both cervix and uterus: Secondary | ICD-10-CM | POA: Diagnosis not present

## 2015-01-18 LAB — CBC
HCT: 30.3 % — ABNORMAL LOW (ref 36.0–46.0)
Hemoglobin: 9.7 g/dL — ABNORMAL LOW (ref 12.0–15.0)
MCH: 27.3 pg (ref 26.0–34.0)
MCHC: 32 g/dL (ref 30.0–36.0)
MCV: 85.4 fL (ref 78.0–100.0)
Platelets: 300 10*3/uL (ref 150–400)
RBC: 3.55 MIL/uL — ABNORMAL LOW (ref 3.87–5.11)
RDW: 14.6 % (ref 11.5–15.5)
WBC: 5.8 10*3/uL (ref 4.0–10.5)

## 2015-01-18 LAB — GLUCOSE, CAPILLARY
Glucose-Capillary: 117 mg/dL — ABNORMAL HIGH (ref 70–99)
Glucose-Capillary: 152 mg/dL — ABNORMAL HIGH (ref 70–99)
Glucose-Capillary: 162 mg/dL — ABNORMAL HIGH (ref 70–99)
Glucose-Capillary: 188 mg/dL — ABNORMAL HIGH (ref 70–99)

## 2015-01-18 MED ORDER — DIPHENHYDRAMINE HCL 25 MG PO CAPS
25.0000 mg | ORAL_CAPSULE | Freq: Once | ORAL | Status: AC
  Administered 2015-01-18: 25 mg via ORAL
  Filled 2015-01-18: qty 1

## 2015-01-18 MED ORDER — SODIUM CHLORIDE 0.9 % IV SOLN
1.0000 mL/kg/h | INTRAVENOUS | Status: DC
  Administered 2015-01-19: 1 mL/kg/h via INTRAVENOUS

## 2015-01-18 MED ORDER — SODIUM CHLORIDE 0.9 % IV SOLN: 250.0000 mL | INTRAVENOUS | Status: DC | PRN

## 2015-01-18 MED ORDER — SODIUM CHLORIDE 0.9 % IJ SOLN: 3.0000 mL | INTRAMUSCULAR | Status: DC | PRN

## 2015-01-18 MED ORDER — SODIUM CHLORIDE 0.9 % IJ SOLN
3.0000 mL | Freq: Two times a day (BID) | INTRAMUSCULAR | Status: DC
  Administered 2015-01-19 (×2): 3 mL via INTRAVENOUS

## 2015-01-18 NOTE — Progress Notes (Signed)
Pt is complaining of back pain sub scapular on right lateral side. Using heat packs but hurts with sudden exertion. Pain is non radiating

## 2015-01-18 NOTE — Progress Notes (Signed)
  Echocardiogram 2D Echocardiogram has been performed.  Darlina Sicilian M 01/18/2015, 3:03 PM

## 2015-01-18 NOTE — Progress Notes (Signed)
Placed patient on home CPAP unit for the night.  

## 2015-01-18 NOTE — Progress Notes (Signed)
    SUBJECTIVE:  She complains of back pain.  This is a small point on the low right back   PHYSICAL EXAM Filed Vitals:   01/17/15 1201 01/17/15 2000 01/18/15 0000 01/18/15 0400  BP: 157/37 153/57 141/47 135/54  Pulse: 78 69 74 64  Temp:  98.7 F (37.1 C) 98.5 F (36.9 C) 98.5 F (36.9 C)  TempSrc:  Oral Oral Oral  Resp:  20 18 18   Height:      Weight:    242 lb 7.7 oz (109.988 kg)  SpO2:  100% 100% 98%   General:  Clear Lungs:  Clear Heart:  RRR Abdomen:  Positive bowel sounds, no rebound no guarding Extremities:  No edema  LABS: Lab Results  Component Value Date   TROPONINI 0.05* 01/17/2015   Results for orders placed or performed during the hospital encounter of 01/16/15 (from the past 24 hour(s))  Glucose, capillary     Status: Abnormal   Collection Time: 01/17/15  1:46 PM  Result Value Ref Range   Glucose-Capillary 161 (H) 70 - 99 mg/dL  Glucose, capillary     Status: Abnormal   Collection Time: 01/17/15  5:08 PM  Result Value Ref Range   Glucose-Capillary 224 (H) 70 - 99 mg/dL  Glucose, capillary     Status: Abnormal   Collection Time: 01/17/15  9:37 PM  Result Value Ref Range   Glucose-Capillary 126 (H) 70 - 99 mg/dL   Comment 1 Notify RN   CBC     Status: Abnormal   Collection Time: 01/18/15  4:35 AM  Result Value Ref Range   WBC 5.8 4.0 - 10.5 K/uL   RBC 3.55 (L) 3.87 - 5.11 MIL/uL   Hemoglobin 9.7 (L) 12.0 - 15.0 g/dL   HCT 30.3 (L) 36.0 - 46.0 %   MCV 85.4 78.0 - 100.0 fL   MCH 27.3 26.0 - 34.0 pg   MCHC 32.0 30.0 - 36.0 g/dL   RDW 14.6 11.5 - 15.5 %   Platelets 300 150 - 400 K/uL   No intake or output data in the 24 hours ending 01/18/15 0853   ASSESSMENT AND PLAN:  CHEST PAIN:  She has had ongoing pain and inferior wall ischemia with an intermediate risk Lexiscan Myoview.  She had borderline troponin.  She will need a cardiac cath.  The patient understands that risks included but are not limited to stroke (1 in 1000), death (1 in 33),  kidney failure [usually temporary] (1 in 500), bleeding (1 in 200), allergic reaction [possibly serious] (1 in 200).  The patient understands and agrees to proceed.   She should not have an LV gram and needs limited dye.  I will hydrate starting tonightn.  Long discussion with the daughter and patient about this plan and her kidneys.  They understand the risk to her kidneys is higher than stated about but that we limit dye and hydrate.   CKD:  Creat is up slightly.   As above.  DM:  Glucose OK.  Continue current therapy  HTN:  BP is slightly high.  Continue current therapy.    Jeneen Rinks [facility] 01/18/2015 8:53 AM

## 2015-01-19 ENCOUNTER — Encounter (HOSPITAL_COMMUNITY): Payer: Self-pay | Admitting: Interventional Cardiology

## 2015-01-19 ENCOUNTER — Encounter (HOSPITAL_COMMUNITY)
Admission: EM | Disposition: A | Discharge status: Home / Self Care | Payer: Self-pay | Source: Home / Self Care | Attending: Internal Medicine

## 2015-01-19 DIAGNOSIS — N189 Chronic kidney disease, unspecified: Secondary | ICD-10-CM

## 2015-01-19 DIAGNOSIS — I2510 Atherosclerotic heart disease of native coronary artery without angina pectoris: Secondary | ICD-10-CM

## 2015-01-19 HISTORY — PX: LEFT HEART CATHETERIZATION WITH CORONARY ANGIOGRAM: SHX5451

## 2015-01-19 LAB — BASIC METABOLIC PANEL
Anion gap: 8 (ref 5–15)
BUN: 32 mg/dL — ABNORMAL HIGH (ref 6–23)
CO2: 28 mmol/L (ref 19–32)
Calcium: 8.6 mg/dL (ref 8.4–10.5)
Chloride: 102 mmol/L (ref 96–112)
Creatinine, Ser: 1.7 mg/dL — ABNORMAL HIGH (ref 0.50–1.10)
GFR calc Af Amer: 34 mL/min — ABNORMAL LOW (ref >90)
GFR calc non Af Amer: 29 mL/min — ABNORMAL LOW (ref >90)
Glucose, Bld: 143 mg/dL — ABNORMAL HIGH (ref 70–99)
Potassium: 4.4 mmol/L (ref 3.5–5.1)
Sodium: 138 mmol/L (ref 135–145)

## 2015-01-19 LAB — CBC
HCT: 28.8 % — ABNORMAL LOW (ref 36.0–46.0)
Hemoglobin: 9.3 g/dL — ABNORMAL LOW (ref 12.0–15.0)
MCH: 27.6 pg (ref 26.0–34.0)
MCHC: 32.3 g/dL (ref 30.0–36.0)
MCV: 85.5 fL (ref 78.0–100.0)
Platelets: 309 10*3/uL (ref 150–400)
RBC: 3.37 MIL/uL — ABNORMAL LOW (ref 3.87–5.11)
RDW: 14.4 % (ref 11.5–15.5)
WBC: 6.4 10*3/uL (ref 4.0–10.5)

## 2015-01-19 LAB — GLUCOSE, CAPILLARY
Glucose-Capillary: 136 mg/dL — ABNORMAL HIGH (ref 70–99)
Glucose-Capillary: 118 mg/dL — ABNORMAL HIGH (ref 70–99)
Glucose-Capillary: 183 mg/dL — ABNORMAL HIGH (ref 70–99)
Glucose-Capillary: 156 mg/dL — ABNORMAL HIGH (ref 70–99)

## 2015-01-19 LAB — HEMOGLOBIN A1C
Hgb A1c MFr Bld: 7.4 % — ABNORMAL HIGH (ref 4.8–5.6)
Mean Plasma Glucose: 166 mg/dL

## 2015-01-19 LAB — PROTIME-INR
INR: 1.2 (ref 0.00–1.49)
Prothrombin Time: 15.3 seconds — ABNORMAL HIGH (ref 11.6–15.2)

## 2015-01-19 SURGERY — LEFT HEART CATHETERIZATION WITH CORONARY ANGIOGRAM
Anesthesia: LOCAL

## 2015-01-19 MED ORDER — LACTULOSE 10 GM/15ML PO SOLN
20.0000 g | Freq: Every day | ORAL | Status: DC
  Administered 2015-01-19: 20 g via ORAL
  Filled 2015-01-19 (×2): qty 30

## 2015-01-19 MED ORDER — HEPARIN SODIUM (PORCINE) 1000 UNIT/ML IJ SOLN
INTRAMUSCULAR | Status: AC
  Filled 2015-01-19: qty 1

## 2015-01-19 MED ORDER — LIDOCAINE HCL (PF) 1 % IJ SOLN
INTRAMUSCULAR | Status: AC
  Filled 2015-01-19: qty 30

## 2015-01-19 MED ORDER — HEPARIN (PORCINE) IN NACL 2-0.9 UNIT/ML-% IJ SOLN
INTRAMUSCULAR | Status: AC
  Filled 2015-01-19: qty 1500

## 2015-01-19 MED ORDER — FENTANYL CITRATE 0.05 MG/ML IJ SOLN
INTRAMUSCULAR | Status: AC
  Filled 2015-01-19: qty 2

## 2015-01-19 MED ORDER — SODIUM CHLORIDE 0.9 % IV SOLN
INTRAVENOUS | Status: DC
  Administered 2015-01-19: 21:00:00 via INTRAVENOUS

## 2015-01-19 MED ORDER — ONDANSETRON HCL 4 MG/2ML IJ SOLN: 4.0000 mg | Freq: Four times a day (QID) | INTRAMUSCULAR | Status: DC | PRN

## 2015-01-19 MED ORDER — HEPARIN SODIUM (PORCINE) 5000 UNIT/ML IJ SOLN
5000.0000 [IU] | Freq: Three times a day (TID) | INTRAMUSCULAR | Status: DC
  Administered 2015-01-19 – 2015-01-20 (×2): 5000 [IU] via SUBCUTANEOUS
  Filled 2015-01-19 (×2): qty 1

## 2015-01-19 MED ORDER — SODIUM CHLORIDE 0.9 % IV SOLN
INTRAVENOUS | Status: AC
  Administered 2015-01-19: 12:00:00 via INTRAVENOUS

## 2015-01-19 MED ORDER — NITROGLYCERIN 1 MG/10 ML FOR IR/CATH LAB
INTRA_ARTERIAL | Status: AC
  Filled 2015-01-19: qty 10

## 2015-01-19 MED ORDER — MIDAZOLAM HCL 2 MG/2ML IJ SOLN
INTRAMUSCULAR | Status: AC
  Filled 2015-01-19: qty 2

## 2015-01-19 MED ORDER — ATORVASTATIN CALCIUM 10 MG PO TABS
10.0000 mg | ORAL_TABLET | Freq: Every day | ORAL | Status: DC
  Administered 2015-01-19 – 2015-01-20 (×2): 10 mg via ORAL
  Filled 2015-01-19 (×2): qty 1

## 2015-01-19 MED ORDER — DIPHENHYDRAMINE HCL 25 MG PO CAPS
25.0000 mg | ORAL_CAPSULE | Freq: Four times a day (QID) | ORAL | Status: DC | PRN
  Administered 2015-01-19: 25 mg via ORAL
  Filled 2015-01-19: qty 1

## 2015-01-19 MED ORDER — VERAPAMIL HCL 2.5 MG/ML IV SOLN
INTRAVENOUS | Status: AC
Start: 2015-01-19 — End: 2015-01-19
  Filled 2015-01-19: qty 2

## 2015-01-19 NOTE — Interval H&P Note (Signed)
Cath Lab Visit (complete for each Cath Lab visit)  Clinical Evaluation Leading to the Procedure:   ACS: Yes.    Non-ACS:    Anginal Classification: CCS III  Anti-ischemic medical therapy: Minimal Therapy (1 class of medications)  Non-Invasive Test Results: Intermediate-risk stress test findings: cardiac mortality 1-3%/year  Prior CABG: No previous CABG      History and Physical Interval Note:  01/19/2015 8:09 AM  Regina [name]  has presented today for surgery, with the diagnosis of unstable angina,  The various methods of treatment have been discussed with the patient and family. After consideration of risks, benefits and other options for treatment, the patient has consented to  Procedure(s): LEFT HEART CATHETERIZATION WITH CORONARY ANGIOGRAM (N/A) as a surgical intervention .  The patient's history has been reviewed, patient examined, no change in status, stable for surgery.  I have reviewed the patient's chart and labs.  Questions were answered to the patient's satisfaction.     Sinclair Grooms

## 2015-01-19 NOTE — Progress Notes (Signed)
Spoke with Cards fellow about pt having slight itching at cardiac leads and ID bracelet. Received and administered a PO Benadryl order PRN. Dose given last night was highly effective.

## 2015-01-19 NOTE — CV Procedure (Signed)
     Left Heart Catheterization with Coronary Angiography  Report  Regina [name]  74 y.o.  female 11-28-1940  Procedure Date: 01/19/2015 Referring Physician: Haroldine Laws, MD Primary Cardiologist: Hochrein  INDICATIONS: Intermediate risk myocardial perfusion study, trace elevated almost, and multiple risk factors for coronary artery disease. Of note 2 prior angiograms have not revealed significant disease.  PROCEDURE: 1. Left heart catheterization; 2. Coronary angiography; 3. Left RRR is not performed because of kidney impairment  CONSENT:  The risks, benefits, and details of the procedure were explained in detail to the patient. Risks including death, stroke, heart attack, kidney injury, allergy, limb ischemia, bleeding and radiation injury were discussed.  The patient verbalized understanding and wanted to proceed.  Informed written consent was obtained.  PROCEDURE TECHNIQUE:  After Xylocaine anesthesia a 5 French Slender sheath was placed in the right radial artery with an angiocath and the modified Seldinger technique.  Coronary angiography was done using a 5 F JR4 and JL 3.5 cm catheter.  Left ventricular pressures were recorded using the JR4.   Images were reviewed and the case was terminated.   CONTRAST:  Total of 55 cc.  COMPLICATIONS:  None   HEMODYNAMICS:  Aortic pressure 152/57 mmHg; LV pressure 166/9 mmHg; LVEDP 19 mmHg   ANGIOGRAPHIC DATA:   The left main coronary artery is normal.  The left anterior descending artery is large and wraps around the left ventricular apex. There is mid eccentric 50% eccentric narrowing after the origin of the first large diagonal. A significant stenosis is not felt to be present..  The left circumflex artery is large and free of any significant obstruction. 3 large obtuse marginals arise from the circumflex. Minimal luminal irregularities are noted..  The right coronary artery is dominant and contains mid vessel eccentric 50-60%  narrowing.  LEFT VENTRICULOGRAM:  Left ventricular angiogram was not performed. Pressures were recorded.   IMPRESSIONS:  1. Eccentric 50% mid LAD and 50% mid RCA. 2. Mildly elevated left ventricular end-diastolic pressures   RECOMMENDATION:  Consider other explanations for the patient's chest discomfort.

## 2015-01-19 NOTE — Progress Notes (Signed)
Pt has home CPAP and will place herself on when ready. RT will continue to monitor.

## 2015-01-19 NOTE — Care Management Note (Unsigned)
    Page 1 of 1   01/19/2015     2:30:02 PM CARE MANAGEMENT NOTE 01/19/2015  Patient:  Regina [name], Regina [name]   Account Number:  [id number]  Date Initiated:  01/19/2015  Documentation initiated by:  GRAVES-BIGELOW,BRENDA  Subjective/Objective Assessment:   Pt admitted for cp. Plan for cath 01-19-15.     Action/Plan:   No needs identified by CM at this time.   Anticipated DC Date:  01/20/2015   Anticipated DC Plan:  [place]  CM consult      Choice offered to / List presented to:             Status of service:  In process, will continue to follow Medicare Important Message given?  YES (If response is "NO", the following Medicare IM given date fields will be blank) Date Medicare IM given:  01/19/2015 Medicare IM given by:  GRAVES-BIGELOW,BRENDA Date Additional Medicare IM given:   Additional Medicare IM given by:    Discharge Disposition:    Per UR Regulation:  Reviewed for med. necessity/level of care/duration of stay  If discussed at [place] of Stay Meetings, dates discussed:    Comments:

## 2015-01-19 NOTE — Consult Note (Addendum)
Unassigned patient Reason for Consult: Noncardiac chest pain Referring Physician: Cardiology service-[facility] Regina [name] is an 74 y.o. female.  HPI: This is a 74 year old woman with past medical history of diabetes, hypertension, obesity with sleep apnea on CPAP, chronic renal disease stage III, and nonobstructive coronary artery disease who presented with chest pain on 01/16/2015. She was admitted to the cardiology service as chest pain was concerning for cardiac etiology. Myoview on 01/17/2015 revealed inferior wall inducible ischemia with intermediate risk stress test findings. She subsequently underwent left heart catheterization on today, which revealed nonobstructive CAD (eccentric 50% mid LAD disease and 50% mid RCA disease). Cardiology requested for GI consult to evaluate for possible GI etiology of her chest pain as ischemic pain was less likely. Regina [name] and states that she has been having several months of intermittent sharp chest pain, which has significantly worsened over the past few weeks. She endorses symptoms of sharp pain, 8/10, retrosternal, sometimes radiates to the left arm, with nausea and sometimes vomiting. The pain is sometimes both positional and exertional. Her pain is somewhat improved with omeprazole but has not relation to specific foods or timing of meals. The pain got worse on Friday before she presented to the ED. She also endorses history of dysphagia without odynophagia. She states that sometimes she has to drink a lot of water before her food can go down. Dysphagia has been present for several months and it is not progressive. It is for both liquids and solids. She denies having any abnormal weight loss. She has occasional epigastric pain when she does not take her PPI's like she is supposed to. She states that she had an EGD ("swallowed a camera") many years ago but she does not remember the name or exact location of her procedure. Most recently she had a colonoscopy  from a hospital in [place], but she states that she was told that her bowel prep was not adequate. She does not smoke or drink alcohol. She states that her stool is dark, but she has never seen blood or melena. She has regular bowel movements. Her appetite has been normal. Her weight has been stable.  Past Medical History  Diagnosis Date  . Diabetes mellitus without complication   . Hypertension   . Sleep apnea   . Thyroid disease    Past Surgical History  Procedure Laterality Date  . Abdominal hysterectomy    . Thyroidectomy     Family history: there is no known family history of colon or stomach cancer. Her father died of metastatic melanoma at the age of 14.  Social History:  reports that she has never smoked. She does not have any smokeless tobacco history on file. She reports that she does not drink alcohol. She denies a history of drug use. 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 She had a brother who died of alcoholic cirrhosis and a sister who has HTN. There is a history of diabetes in several family members. y care ditre Allergies:  Allergies  Allergen Reactions  . Ace Inhibitors   . Amlodipine   . Atenolol     bradycardia  . Avandia [Rosiglitazone]   . Darvon [Propoxyphene]   . Erythromycin Itching  . Hydralazine     Burning in throat and chest  . Hydrocodone     No reaction per patient, but ineffective  . Levofloxacin   . Morphine And Related Other (See Comments)    Dizzy and hallucianation, vomiting; Willing to try low dose  .  Percocet [Oxycodone-Acetaminophen]     hallucination  . Spironolactone   . Tramadol     Unknown/does not recall reaction but does not want to take again   Medications: I have reviewed the patient's current medications.  Marland Kitchen aspirin  81 mg Oral Daily  . atorvastatin  10 mg Oral q1800  . gabapentin  400 mg Oral QHS  . heparin  5,000 Units Subcutaneous 3 times per day  . insulin aspart  0-15 Units Subcutaneous TID WC  . levothyroxine  125 mcg  Oral QAC breakfast  . pantoprazole  40 mg Oral Daily  . terazosin  5 mg Oral QHS  acetaminophen, diclofenac sodium, morphine injection, nitroGLYCERIN, ondansetron (ZOFRAN) IV  Results for orders placed or performed during the hospital encounter of 01/16/15 (from the past 48 hour(s))  Glucose, capillary     Status: Abnormal   Collection Time: 01/17/15  5:08 PM  Result Value Ref Range   Glucose-Capillary 224 (H) 70 - 99 mg/dL  Glucose, capillary     Status: Abnormal   Collection Time: 01/17/15  9:37 PM  Result Value Ref Range   Glucose-Capillary 126 (H) 70 - 99 mg/dL   Comment 1 Notify RN   CBC     Status: Abnormal   Collection Time: 01/18/15  4:35 AM  Result Value Ref Range   WBC 5.8 4.0 - 10.5 K/uL   RBC 3.55 (L) 3.87 - 5.11 MIL/uL   Hemoglobin 9.7 (L) 12.0 - 15.0 g/dL   HCT 30.3 (L) 36.0 - 46.0 %   MCV 85.4 78.0 - 100.0 fL   MCH 27.3 26.0 - 34.0 pg   MCHC 32.0 30.0 - 36.0 g/dL   RDW 14.6 11.5 - 15.5 %   Platelets 300 150 - 400 K/uL  Glucose, capillary     Status: Abnormal   Collection Time: 01/18/15  7:29 AM  Result Value Ref Range   Glucose-Capillary 117 (H) 70 - 99 mg/dL  Glucose, capillary     Status: Abnormal   Collection Time: 01/18/15 11:33 AM  Result Value Ref Range   Glucose-Capillary 152 (H) 70 - 99 mg/dL  Glucose, capillary     Status: Abnormal   Collection Time: 01/18/15  5:48 PM  Result Value Ref Range   Glucose-Capillary 162 (H) 70 - 99 mg/dL  Glucose, capillary     Status: Abnormal   Collection Time: 01/18/15  9:19 PM  Result Value Ref Range   Glucose-Capillary 188 (H) 70 - 99 mg/dL  CBC     Status: Abnormal   Collection Time: 01/19/15  5:15 AM  Result Value Ref Range   WBC 6.4 4.0 - 10.5 K/uL   RBC 3.37 (L) 3.87 - 5.11 MIL/uL   Hemoglobin 9.3 (L) 12.0 - 15.0 g/dL   HCT 28.8 (L) 36.0 - 46.0 %   MCV 85.5 78.0 - 100.0 fL   MCH 27.6 26.0 - 34.0 pg   MCHC 32.3 30.0 - 36.0 g/dL   RDW 14.4 11.5 - 15.5 %   Platelets 309 150 - 400 K/uL  Protime-INR      Status: Abnormal   Collection Time: 01/19/15  5:15 AM  Result Value Ref Range   Prothrombin Time 15.3 (H) 11.6 - 15.2 seconds   INR 1.20 0.00 - 2.87  Basic metabolic panel     Status: Abnormal   Collection Time: 01/19/15  6:45 AM  Result Value Ref Range   Sodium 138 135 - 145 mmol/L   Potassium 4.4 3.5 - 5.1 mmol/L  Chloride 102 96 - 112 mmol/L   CO2 28 19 - 32 mmol/L   Glucose, Bld 143 (H) 70 - 99 mg/dL   BUN 32 (H) 6 - 23 mg/dL   Creatinine, Ser 1.70 (H) 0.50 - 1.10 mg/dL   Calcium 8.6 8.4 - 10.5 mg/dL   GFR calc non Af Amer 29 (L) >90 mL/min   GFR calc Af Amer 34 (L) >90 mL/min    Comment: (NOTE) The eGFR has been calculated using the CKD EPI equation. This calculation has not been validated in all clinical situations. eGFR's persistently <90 mL/min signify possible Chronic Kidney Disease.    Anion gap 8 5 - 15  Glucose, capillary     Status: Abnormal   Collection Time: 01/19/15  7:32 AM  Result Value Ref Range   Glucose-Capillary 136 (H) 70 - 99 mg/dL  Glucose, capillary     Status: Abnormal   Collection Time: 01/19/15 12:23 PM  Result Value Ref Range   Glucose-Capillary 118 (H) 70 - 99 mg/dL    ROS:  As stated above in the HPI otherwise negative.  Blood pressure 153/60, pulse 72, temperature 98.5 F (36.9 C), temperature source Oral, resp. rate 18, height _0  (1.575 m), weight 244 lb 3.2 oz (110.768 kg), SpO2 98 %.  PE: Gen: NAD, Alert and Oriented. Son and daughter in the room. HEENT:  [place]/AT, EOMI Neck: Supple, no LAD Lungs: CTA Bilaterally CV: RRR without M/G/R ABM: Soft, Obese, NTND, +BS Ext: No C/C/E  Assessment/Plan:  This is a 74 year old woman with past medical history of diabetes, hypertension, obesity with sleep apnea on CPAP, chronic renal disease stage III, and nonobstructive coronary artery disease who presented with chest pain on 01/16/2015. Gastroenterology consult was requested to evaluate the patient for GI related chest pain after negative  cardiac workup including a left heart catheterization.  Atypical Chest pain: Improved. Workup for cardiac etiology has been completed. Myoview revealed inferior wall induced ischemia. Follow-up left catheterization has revealed nonobstructive CAD. Patient takes omeprazole 20 mg daily and was switched to Protonix 40 mg daily. Chart review does not reveal prior GI workup. It is possible that her chest pain might be related to GERD but she also has some features of dysphagia. Her risk factors for GERD include obesity and use of NSAIDs. She does have anemia which might be related to chronic renal disease versus chronic GI bleed. FOBT was negative in April 2015. Plan - inpatient EGD-will be done tomorrow. -Trial of GI cocktail  -Continue with by mouth Protonix 40 mg daily -Recheck FOBT  CKD 3: Baseline creatinine around 1.5. Currently 1.7. No acute electrolyte abnormalities. Will continue to monitor.  Hypertension: BP well controlled.  Obesity: With obstructive sleep apnea. Continue with CPAP  OSA: Continue with CPAP nightly  Anemia: Normocytic normochromic. Likely related to CKD. Hemoglobin 11.22 June 2013. Currently 9.3.  Plan -Will evaluate for GI bleed as above in problem #1 - check anemia panel   Hypothyroidism: Continue with Synthroid Case discussed with my attending, Dr Collene Mares.  Signed: Jessee Avers, MD PGY-3 Internal Medicine Teaching Service Pager: [phone] 01/19/2015, 5:00 PM   I agree with the above assessment. Patient seen and examined with Dr. Alice Rieger. 89 year old black female with a 1 year history of reflux with occasional epigastric pain and nausea, worse when she does not take her PPI's as scheduled, presented to the hospital with a 1 week history of worsening chest pain; cardiac cath was unrevealing. She has had intermittent dysphagia  and therefore an EGD is scheduled for her tomorrow at 2 PM. Nelwyn Salisbury, MD 01/19/15.

## 2015-01-19 NOTE — Progress Notes (Signed)
Subjective: No pain currently  Objective: Vital signs in last 24 hours: Temp:  [98.5 F (36.9 C)-99.1 F (37.3 C)] 98.5 F (36.9 C) (04/04 1227) Pulse Rate:  [55-82] 72 (04/04 1055) Resp:  [18] 18 (04/04 0735) BP: (122-164)/(38-69) 153/60 mmHg (04/04 1351) SpO2:  [97 %-100 %] 98 % (04/04 0735) Weight:  [244 lb 3.2 oz (110.768 kg)] 244 lb 3.2 oz (110.768 kg) (04/04 0400) Weight change: 1 lb 11.5 oz (0.78 kg) Last BM Date: 01/16/15 Intake/Output from previous day: +239 04/03 0701 - 04/04 0700 In: 240 [P.O.:240] Out: 1 [Urine:1] Intake/Output this shift: Total I/O In: 240 [P.O.:240] Out: -   PE: General:Pleasant affect, NAD Skin:Warm and dry, brisk capillary refill HEENT:normocephalic, sclera clear, mucus membranes moist Neck:supple, no JVD, no bruits  Heart:S1S2 RRR without murmur, gallup, rub or click Lungs:clear without rales, rhonchi, or wheezes [id number], non tender, + BS, do not palpate liver spleen or masses Ext:no lower ext edema, 2+ pedal pulses, 2+ radial pulses Neuro:alert and oriented, MAE, follows commands, + facial symmetry TELE:    Lab Results:  Recent Labs  01/18/15 0435 01/19/15 0515  WBC 5.8 6.4  HGB 9.7* 9.3*  HCT 30.3* 28.8*  PLT 300 309   BMET  Recent Labs  01/17/15 0146 01/19/15 0645  NA 138 138  K 4.3 4.4  CL 105 102  CO2 26 28  GLUCOSE 114* 143*  BUN 34* 32*  CREATININE 1.87* 1.70*  CALCIUM 8.4 8.6    Recent Labs  01/17/15 0146 01/17/15 0724  TROPONINI 0.06* 0.05*    Lab Results  Component Value Date   CHOL 122 01/17/2015   HDL 39* 01/17/2015   LDLCALC 56 01/17/2015   TRIG 133 01/17/2015   CHOLHDL 3.1 01/17/2015   Lab Results  Component Value Date   HGBA1C 7.4* 01/16/2015     Lab Results  Component Value Date   TSH 1.926 01/16/2015    Hepatic Function Panel No results for input(s): PROT, ALBUMIN, AST, ALT, ALKPHOS, BILITOT, BILIDIR, IBILI in the last 72 hours.  Recent Labs  01/17/15 0146    CHOL 122   No results for input(s): PROTIME in the last 72 hours.     Studies/Results: No results found.  Medications: I have reviewed the patient's current medications. Scheduled Meds: . aspirin  81 mg Oral Daily  . gabapentin  400 mg Oral QHS  . heparin  5,000 Units Subcutaneous 3 times per day  . insulin aspart  0-15 Units Subcutaneous TID WC  . levothyroxine  125 mcg Oral QAC breakfast  . pantoprazole  40 mg Oral Daily  . terazosin  5 mg Oral QHS   Continuous Infusions: . sodium chloride 75 mL/hr at 01/19/15 1209   PRN Meds:.acetaminophen, diclofenac sodium, morphine injection, nitroGLYCERIN, ondansetron (ZOFRAN) IV  Assessment/Plan: CHEST PAIN: She has had ongoing pain and inferior wall ischemia with an intermediate risk Lexiscan Myoview. She had borderline troponin. She had a cardiac cath. + CAD but non obstructive.  No pain today, none with deep breath and none to palpation.  Most likely MS, though her LDL is low with her diabetes and CAD low dose statin would be beneficial.    CAD 1. Eccentric 50% mid LAD and 50% mid RCA.    CKD: Creat is up slightly. As above. Was 1.7 this am  DM: Glucose OK. Continue current therapy  HTN: BP is slightly high. . Normal LV function; moderate LVH; grade 2 diastolic dysfunction with  elevated LV filling pressure; mild LAE; trace MR  OSA on cpap  Anemia  Constipation  Add home lactulose back  Keep today and ambulate, if no further pain will d/c in AM.   Uuse ultram  for pain, she will follow up with her PCP on the 20th.  Check renal function in AM.    LOS: 1 day   Time spent with pt. : 15 minutes. [facility] R  Nurse Practitioner Certified Pager [phone] or after 5pm and on weekends call [phone] 01/19/2015, 2:09 PM

## 2015-01-19 NOTE — H&P (View-Only) (Signed)
    SUBJECTIVE:  She complains of back pain.  This is a small point on the low right back   PHYSICAL EXAM Filed Vitals:   01/17/15 1201 01/17/15 2000 01/18/15 0000 01/18/15 0400  BP: 157/37 153/57 141/47 135/54  Pulse: 78 69 74 64  Temp:  98.7 F (37.1 C) 98.5 F (36.9 C) 98.5 F (36.9 C)  TempSrc:  Oral Oral Oral  Resp:  20 18 18   Height:      Weight:    242 lb 7.7 oz (109.988 kg)  SpO2:  100% 100% 98%   General:  Clear Lungs:  Clear Heart:  RRR Abdomen:  Positive bowel sounds, no rebound no guarding Extremities:  No edema  LABS: Lab Results  Component Value Date   TROPONINI 0.05* 01/17/2015   Results for orders placed or performed during the hospital encounter of 01/16/15 (from the past 24 hour(s))  Glucose, capillary     Status: Abnormal   Collection Time: 01/17/15  1:46 PM  Result Value Ref Range   Glucose-Capillary 161 (H) 70 - 99 mg/dL  Glucose, capillary     Status: Abnormal   Collection Time: 01/17/15  5:08 PM  Result Value Ref Range   Glucose-Capillary 224 (H) 70 - 99 mg/dL  Glucose, capillary     Status: Abnormal   Collection Time: 01/17/15  9:37 PM  Result Value Ref Range   Glucose-Capillary 126 (H) 70 - 99 mg/dL   Comment 1 Notify RN   CBC     Status: Abnormal   Collection Time: 01/18/15  4:35 AM  Result Value Ref Range   WBC 5.8 4.0 - 10.5 K/uL   RBC 3.55 (L) 3.87 - 5.11 MIL/uL   Hemoglobin 9.7 (L) 12.0 - 15.0 g/dL   HCT 30.3 (L) 36.0 - 46.0 %   MCV 85.4 78.0 - 100.0 fL   MCH 27.3 26.0 - 34.0 pg   MCHC 32.0 30.0 - 36.0 g/dL   RDW 14.6 11.5 - 15.5 %   Platelets 300 150 - 400 K/uL   No intake or output data in the 24 hours ending 01/18/15 0853   ASSESSMENT AND PLAN:  CHEST PAIN:  She has had ongoing pain and inferior wall ischemia with an intermediate risk Lexiscan Myoview.  She had borderline troponin.  She will need a cardiac cath.  The patient understands that risks included but are not limited to stroke (1 in 1000), death (1 in 3),  kidney failure [usually temporary] (1 in 500), bleeding (1 in 200), allergic reaction [possibly serious] (1 in 200).  The patient understands and agrees to proceed.   She should not have an LV gram and needs limited dye.  I will hydrate starting tonightn.  Long discussion with the daughter and patient about this plan and her kidneys.  They understand the risk to her kidneys is higher than stated about but that we limit dye and hydrate.   CKD:  Creat is up slightly.   As above.  DM:  Glucose OK.  Continue current therapy  HTN:  BP is slightly high.  Continue current therapy.    Jeneen Rinks [facility] 01/18/2015 8:53 AM

## 2015-01-20 ENCOUNTER — Encounter (HOSPITAL_COMMUNITY)
Admission: EM | Disposition: A | Discharge status: Home / Self Care | Payer: Self-pay | Source: Home / Self Care | Attending: Internal Medicine

## 2015-01-20 ENCOUNTER — Encounter (HOSPITAL_COMMUNITY): Payer: Self-pay | Admitting: Nurse Practitioner

## 2015-01-20 DIAGNOSIS — I2510 Atherosclerotic heart disease of native coronary artery without angina pectoris: Secondary | ICD-10-CM | POA: Diagnosis present

## 2015-01-20 DIAGNOSIS — G4733 Obstructive sleep apnea (adult) (pediatric): Secondary | ICD-10-CM

## 2015-01-20 DIAGNOSIS — I10 Essential (primary) hypertension: Secondary | ICD-10-CM

## 2015-01-20 HISTORY — PX: ESOPHAGOGASTRODUODENOSCOPY: SHX5428

## 2015-01-20 LAB — BASIC METABOLIC PANEL
Anion gap: 4 — ABNORMAL LOW (ref 5–15)
BUN: 26 mg/dL — ABNORMAL HIGH (ref 6–23)
CO2: 27 mmol/L (ref 19–32)
Calcium: 8.7 mg/dL (ref 8.4–10.5)
Chloride: 103 mmol/L (ref 96–112)
Creatinine, Ser: 1.52 mg/dL — ABNORMAL HIGH (ref 0.50–1.10)
GFR calc Af Amer: 39 mL/min — ABNORMAL LOW (ref >90)
GFR calc non Af Amer: 33 mL/min — ABNORMAL LOW (ref >90)
Glucose, Bld: 153 mg/dL — ABNORMAL HIGH (ref 70–99)
Potassium: 4.4 mmol/L (ref 3.5–5.1)
Sodium: 134 mmol/L — ABNORMAL LOW (ref 135–145)

## 2015-01-20 LAB — GLUCOSE, CAPILLARY
Glucose-Capillary: 134 mg/dL — ABNORMAL HIGH (ref 70–99)
Glucose-Capillary: 205 mg/dL — ABNORMAL HIGH (ref 70–99)

## 2015-01-20 LAB — OCCULT BLOOD X 1 CARD TO LAB, STOOL: Fecal Occult Bld: NEGATIVE

## 2015-01-20 SURGERY — EGD (ESOPHAGOGASTRODUODENOSCOPY)
Anesthesia: Moderate Sedation

## 2015-01-20 MED ORDER — CYCLOBENZAPRINE HCL 10 MG PO TABS: 5.0000 mg | ORAL_TABLET | Freq: Three times a day (TID) | ORAL | Status: DC | PRN

## 2015-01-20 MED ORDER — MIDAZOLAM HCL 10 MG/2ML IJ SOLN
INTRAMUSCULAR | Status: DC | PRN
  Administered 2015-01-20 (×2): 2 mg via INTRAVENOUS

## 2015-01-20 MED ORDER — FENTANYL CITRATE 0.05 MG/ML IJ SOLN
INTRAMUSCULAR | Status: AC
  Filled 2015-01-20: qty 2

## 2015-01-20 MED ORDER — FENTANYL CITRATE 0.05 MG/ML IJ SOLN
INTRAMUSCULAR | Status: DC | PRN
  Administered 2015-01-20 (×2): 25 ug via INTRAVENOUS

## 2015-01-20 MED ORDER — DIPHENHYDRAMINE HCL 50 MG/ML IJ SOLN
INTRAMUSCULAR | Status: AC
  Filled 2015-01-20: qty 1

## 2015-01-20 MED ORDER — MIDAZOLAM HCL 5 MG/ML IJ SOLN
INTRAMUSCULAR | Status: AC
  Filled 2015-01-20: qty 2

## 2015-01-20 MED ORDER — LOSARTAN POTASSIUM 50 MG PO TABS
100.0000 mg | ORAL_TABLET | Freq: Every day | ORAL | Status: DC
  Administered 2015-01-20: 100 mg via ORAL
  Filled 2015-01-20: qty 2

## 2015-01-20 MED ORDER — ATORVASTATIN CALCIUM 10 MG PO TABS: 10.0000 mg | ORAL_TABLET | Freq: Every day | ORAL | Status: DC

## 2015-01-20 NOTE — Discharge Summary (Signed)
Discharge Summary   Patient ID: Regina [name],  MRN: [id number], DOB/AGE: Apr 27, 1941 74 y.o.  Admit date: 01/16/2015 Discharge date: 01/20/2015  Primary Care Provider: Zella Richer A Primary Cardiologist: Dr. [facility]ence [facility]  Discharge Diagnoses Principal Problem:   Pleuritic chest pain  **Intermediate risk stress test this admission with inferior ischemia.  **Cardiac cath revealed moderate non-obstructive CAD.  **EGD showed nl esophagus  Active Problems:   Acute renal failure superimposed on stage 3 chronic kidney disease   Obesity, Class III, BMI 40-49.9 (morbid obesity)   Essential hypertension   OSA on CPAP   DM type 2 (diabetes mellitus, type 2)   Non-obstructive CAD   Hypothyroidism  Allergies Allergies  Allergen Reactions  . Ace Inhibitors   . Amlodipine   . Atenolol     bradycardia  . Avandia [Rosiglitazone]   . Darvon [Propoxyphene]   . Erythromycin Itching  . Hydralazine     Burning in throat and chest  . Hydrocodone     No reaction per patient, but ineffective  . Levofloxacin   . Morphine And Related Other (See Comments)    Dizzy and hallucianation, vomiting; Willing to try low dose  . Percocet [Oxycodone-Acetaminophen]     hallucination  . Spironolactone   . Tramadol     Unknown/does not recall reaction but does not want to take again    Procedures  Lexiscan Cardiolite 4.2.2016  IMPRESSION: 1. Positive exam for inferior wall inducible ischemia. 2. Normal left ventricular wall motion. 3. Left ventricular ejection fraction 56% 4. Intermediate-risk stress test findings*. _____________   2D Echocardiogram 4.3.2016  Study Conclusions  - Left ventricle: The cavity size was normal. Wall thickness was   increased in a pattern of moderate LVH. Systolic function was   normal. The estimated ejection fraction was in the range of 55%   to 60%. Wall motion was normal; there were no regional wall   motion abnormalities. Features are consistent  with a pseudonormal   left ventricular filling pattern, with concomitant abnormal   relaxation and increased filling pressure (grade 2 diastolic   dysfunction). Doppler parameters are consistent with high   ventricular filling pressure. - Left atrium: The atrium was mildly dilated. _____________   Cardiac Catheterization 4.4.2016  HEMODYNAMICS:  Aortic pressure 152/57 mmHg; LV pressure 166/9 mmHg; LVEDP 19 mmHg  ANGIOGRAPHIC DATA:     The left main coronary artery is normal. The left anterior descending artery is large and wraps around the left ventricular apex. There is mid eccentric 50% eccentric narrowing after the origin of the first large diagonal. A significant stenosis is not felt to be present.. The left circumflex artery is large and free of any significant obstruction. 3 large obtuse marginals arise from the circumflex. Minimal luminal irregularities are noted.. The right coronary artery is dominant and contains mid vessel eccentric 50-60% narrowing.  LEFT VENTRICULOGRAM:  Left ventricular angiogram was not performed. Pressures were recorded. _____________   EGD 4.5.2016  ENDOSCOPIC IMPRESSION: 1) Patent esophagus.   RECOMMENDATIONS: 1) Follow up biopsy results. 2) Continue with Protonix or any PPI 30 minutes before breakfast. 3) Follow up with Dr. Collene Mares in one month. _____________   History of Present Illness  74 y/o female with a h/o non-obstructive CAD, CKD III, DM, HTN, and OSA.  She was in her usual state of health until approximately one week prior to admission when she began to experience pleuritic chest discomfort that was worse with deep breathing and coughing.  This was  sometimes associated with dyspnea, nausea, diaphoresis, palpitations, dizziness, and/or chills.  She presented to [facility] ED on 4/1, where ECG was non-acute.  Troponin was mildly elevated @ 0.06. Creatinine was elevated at 1.74.  She was admitted for further evaluation.  Hospital  Course  Patient continued to c/o chest pain.  Troponin remained elevated at 0.06 x 3.  Given this flat trend, this was not felt to represent NSTEMI.  A lexiscan cardiolite was performed and revealed inferior ischemia.  Echo showed normal LV function.  In light of abnormal stress testing and ongoing c/p, decision was made to pursue diagnostic catheterization.  She was hydrated and creatinine remained stable in teh 1.7 to 1.8 range.  She underwent diagnostic cath on 4/4, revealing moderate, non-obstructive LAD and RCA disease.  Medical therapy was recommended.  Creatinine remained stable post-cath.   As she continued to c/o chest pain and has a h/o GERD, nausea, and chronic constipation, there was some concern that a motility disorder may be playing role.  GI was consulted.  EGD was performed this AM and showed a normal esophagus.  Biopsies were obtained.  She has been cleared for discharge with recommendation for f/u with GI in 1 month.  Discharge Vitals Blood pressure 153/37, pulse 64, temperature 98.3 F (36.8 C), temperature source Oral, resp. rate 14, height 5\' 2"  (1.575 m), weight 245 lb 1.6 oz (111.177 kg), SpO2 94 %.  Filed Weights   01/18/15 0400 01/19/15 0400 01/20/15 0500  Weight: 242 lb 7.7 oz (109.988 kg) 244 lb 3.2 oz (110.768 kg) 245 lb 1.6 oz (111.177 kg)    Labs  CBC  Recent Labs  01/18/15 0435 01/19/15 0515  WBC 5.8 6.4  HGB 9.7* 9.3*  HCT 30.3* 28.8*  MCV 85.4 85.5  PLT 300 [id number]   Basic Metabolic Panel  Recent Labs  01/19/15 0645 01/20/15 0439  NA 138 134*  K 4.4 4.4  CL 102 103  CO2 28 27  GLUCOSE 143* 153*  BUN 32* 26*  CREATININE 1.70* 1.52*  CALCIUM 8.6 8.7   Liver Function Tests Lab Results  Component Value Date   ALT 19 01/16/2015   AST 23 01/16/2015   ALKPHOS 84 01/16/2015   BILITOT 0.3 01/16/2015    Cardiac Enzymes Lab Results  Component Value Date   TROPONINI 0.05* 01/17/2015   Hemoglobin A1C Lab Results  Component Value Date   HGBA1C  7.4* 01/16/2015   Fasting Lipid Panel Lab Results  Component Value Date   CHOL 122 01/17/2015   HDL 39* 01/17/2015   LDLCALC 56 01/17/2015   TRIG 133 01/17/2015   CHOLHDL 3.1 01/17/2015    Thyroid Function Tests Lab Results  Component Value Date   TSH 1.926 01/16/2015    Disposition  Pt is being discharged home today in good condition.  Follow-up Plans & Appointments      Follow-up Information    Follow up with Juanita Craver, MD.   Specialty:  Gastroenterology   Why:  GI follow-up.  Call for appt in 1 month.   Contact information:   [address] [place] [address] [phone]       Follow up with Dr. Benetta Spar [facility].   Why:  2-3 wks for post-cath follow-up.      Discharge Medications    Medication List    STOP taking these medications        hydrochlorothiazide 25 MG tablet  Commonly known as:  HYDRODIURIL     ondansetron  4 MG tablet  Commonly known as:  ZOFRAN     potassium chloride 10 MEQ tablet  Commonly known as:  K-DUR      TAKE these medications        aspirin 81 MG tablet  Take 81 mg by mouth daily.     atorvastatin 10 MG tablet  Commonly known as:  LIPITOR  Take 1 tablet (10 mg total) by mouth daily at 6 PM.     cyclobenzaprine 10 MG tablet  Commonly known as:  FLEXERIL  Take 10 mg by mouth at bedtime as needed for muscle spasms.     gabapentin 400 MG capsule  Commonly known as:  NEURONTIN  Take 400 mg by mouth at bedtime.     lactulose 10 GM/15ML solution  Commonly known as:  CHRONULAC  Take 20 g by mouth daily.     levothyroxine 125 MCG tablet  Commonly known as:  SYNTHROID, LEVOTHROID  Take 125 mcg by mouth daily before breakfast.     losartan 100 MG tablet  Commonly known as:  COZAAR  Take 100 mg by mouth daily.     meclizine 25 MG tablet  Commonly known as:  ANTIVERT  Take 25 mg by mouth 3 (three) times daily as needed for dizziness.     meloxicam 15 MG tablet  Commonly known as:  MOBIC  Take 15 mg  by mouth daily at 12 noon.     omeprazole 20 MG capsule  Commonly known as:  PRILOSEC  Take 20 mg by mouth daily.     terazosin 5 MG capsule  Commonly known as:  HYTRIN  Take 5 mg by mouth at bedtime.     Vitamin D3 5000 UNITS Caps  Take 5,000 Units by mouth daily at 12 noon.        Outstanding Labs/Studies  None  Duration of Discharge Encounter   Greater than 30 minutes including physician time.  Signed, Murray Hodgkins NP 01/20/2015, 6:51 PM

## 2015-01-20 NOTE — Progress Notes (Signed)
.         Subjective: + back pain, under rt scapula, + pain to palpation  Objective: Vital signs in last 24 hours: Temp:  [97.8 F (36.6 C)-99.2 F (37.3 C)] 99.2 F (37.3 C) (04/05 0400) Pulse Rate:  [65-72] 65 (04/05 0400) Resp:  [18-20] 18 (04/05 0400) BP: (122-185)/(42-75) 131/42 mmHg (04/05 0400) SpO2:  [97 %] 97 % (04/05 0400) Weight:  [245 lb 1.6 oz (111.177 kg)] 245 lb 1.6 oz (111.177 kg) (04/05 0500) Weight change: 14.4 oz (0.408 kg) Last BM Date: 01/19/15 Intake/Output from previous day: +540 04/04 0701 - 04/05 0700 In: 540 [P.O.:540] Out: -  Intake/Output this shift:    PE: General:Pleasant affect, NAD Skin:Warm and dry, brisk capillary refill HEENT:normocephalic, sclera clear, mucus membranes moist Heart:S1S2 RRR without murmur, gallup, rub or click Lungs:clear without rales, rhonchi, or wheezes [id number], non tender, + BS, do not palpate liver spleen or masses Ext:no lower ext edema, 2+ pedal pulses, 2+ radial pulses, Back, + pain Rt scapula area Neuro:alert and oriented X 3, MAE, follows commands, + facial symmetry Tele:  SR     Lab Results:  Recent Labs  01/18/15 0435 01/19/15 0515  WBC 5.8 6.4  HGB 9.7* 9.3*  HCT 30.3* 28.8*  PLT 300 309   BMET  Recent Labs  01/19/15 0645 01/20/15 0439  NA 138 134*  K 4.4 4.4  CL 102 103  CO2 28 27  GLUCOSE 143* 153*  BUN 32* 26*  CREATININE 1.70* 1.52*  CALCIUM 8.6 8.7   No results for input(s): TROPONINI in the last 72 hours.  Invalid input(s): CK, MB  Lab Results  Component Value Date   CHOL 122 01/17/2015   HDL 39* 01/17/2015   LDLCALC 56 01/17/2015   TRIG 133 01/17/2015   CHOLHDL 3.1 01/17/2015   Lab Results  Component Value Date   HGBA1C 7.4* 01/16/2015     Lab Results  Component Value Date   TSH 1.926 01/16/2015     Studies/Results: No results found.  Medications: I have reviewed the patient's current medications. Scheduled Meds: . aspirin  81 mg Oral Daily  .  atorvastatin  10 mg Oral q1800  . gabapentin  400 mg Oral QHS  . heparin  5,000 Units Subcutaneous 3 times per day  . insulin aspart  0-15 Units Subcutaneous TID WC  . lactulose  20 g Oral Daily  . levothyroxine  125 mcg Oral QAC breakfast  . pantoprazole  40 mg Oral Daily  . terazosin  5 mg Oral QHS   Continuous Infusions: . sodium chloride 75 mL/hr at 01/19/15 1209  . sodium chloride 20 mL/hr at 01/19/15 2042   PRN Meds:.acetaminophen, diclofenac sodium, diphenhydrAMINE, morphine injection, nitroGLYCERIN, ondansetron (ZOFRAN) IV  Assessment/Plan: CHEST PAIN: She has had ongoing pain and inferior wall ischemia with an intermediate risk Lexiscan Myoview. She had borderline troponin. She had a cardiac cath. + CAD but non obstructive. No pain today, none with deep breath and none to palpation. Most likely MS, though her LDL is low with her diabetes and CAD low dose statin would be beneficial. for EGD today, Dr. Collene Mares saw yesterday.   CAD 1. Eccentric 50% mid LAD and 50% mid RCA.   CKD: Creat is up slightly. As above. Was 1.7 yesterday now 1.52  DM: Glucose OK. Continue current therapy  HTN: BP is slightly high. . Normal LV function; moderate LVH; grade 2 diastolic dysfunction with elevated LV filling pressure; mild LAE; trace MR will  resume cozaar   OSA on cpap  Anemia  Constipation Add home lactulose back  Back pain, muscular- add flexaril.     LOS: 2 days   Time spent with pt. : 15 minutes. [facility]  Nurse Practitioner Certified Pager [phone] or after 5pm and on weekends call [phone] 01/20/2015, 9:29 AM

## 2015-01-20 NOTE — Discharge Instructions (Signed)

## 2015-01-20 NOTE — Progress Notes (Signed)
Pt d/c home with son. Discharge instructions reviewed with Pt.

## 2015-01-20 NOTE — Op Note (Signed)
[facility], [id number]   ENDOSCOPY PROCEDURE REPORT  PATIENT: Regina, [name]  MR#: [id number] BIRTHDATE: [birth year] , 74  yrs. old GENDER: female ENDOSCOPIST:Patrick Benson Norway, MD REFERRED BY: PROCEDURE DATE:  Feb 06, 2015 PROCEDURE:   EGD w/ biopsy ASA CLASS:    Class III INDICATIONS: dysphagia. MEDICATION: Versed 4 mg IV and Fentanyl 50 mcg IV TOPICAL ANESTHETIC:   none  DESCRIPTION OF PROCEDURE:   After the risks and benefits of the procedure were explained, informed consent was obtained.  The PENTAX GASTOROSCOPE [id number]  endoscope was introduced through the mouth  and advanced to the second portion of the duodenum .  The instrument was slowly withdrawn as the mucosa was fully examined. Estimated blood loss is zero unless otherwise noted in this procedure report.    FINDINGS: The esophagus was widely patent.  The Z-line was  sharp and there was no overt evidence of any esophagitis.  Cold biopsies wer obtained in the mid to upper esophagus to evaluate for EoE. The gastric and duodenal lumens were normal.    Retroflexed views revealed no abnormalities.    The scope was then withdrawn from the patient and the procedure completed.  COMPLICATIONS: There were no immediate complications.  ENDOSCOPIC IMPRESSION: 1) Patent esophagus.  RECOMMENDATIONS: 1) Follow up biopsy results. 2) Continue with Protonix or any PPI 30 minutes before breakfast. 3) Follow up with Dr. Collene Mares in one month.   _______________________________ eSigned:  Carol Ada, MD 06-Feb-2015 3:06 PM     cc:  CPT CODES: ICD CODES:  The ICD and CPT codes recommended by this software are interpretations from the data that the clinical staff has captured with the software.  The verification of the translation of this report to the ICD and CPT codes and modifiers is the sole responsibility of the health care institution and practicing physician where this  report was generated.  [employer] will not be held responsible for the validity of the ICD and CPT codes included on this report.  AMA assumes no liability for data contained or not contained herein. CPT is a Designer, television/film set of the [employer].

## 2015-01-21 ENCOUNTER — Encounter (HOSPITAL_COMMUNITY): Payer: Self-pay | Admitting: Gastroenterology

## 2015-01-26 LAB — GLUCOSE, CAPILLARY: Glucose-Capillary: 144 mg/dL — ABNORMAL HIGH (ref 70–99)

## 2015-03-18 DIAGNOSIS — Z96651 Presence of right artificial knee joint: Secondary | ICD-10-CM | POA: Insufficient documentation

## 2015-04-10 ENCOUNTER — Encounter (HOSPITAL_COMMUNITY): Payer: Self-pay

## 2015-04-10 NOTE — Progress Notes (Signed)
Patient delivered forms from [employer] for an insurance claim after patient's recent hospitalization where Dr. Haroldine Laws was admitting doctor in ED. All forms and requested documents faxed to provided # [phone] Copy of forms scanned into electronic medical records for future reference. Original forms with all documents mailed to patient per patient request.  Renee Pain

## 2015-04-12 ENCOUNTER — Other Ambulatory Visit: Payer: Self-pay | Admitting: Nurse Practitioner

## 2016-03-07 ENCOUNTER — Other Ambulatory Visit: Payer: Self-pay | Admitting: Nurse Practitioner

## 2016-12-19 DIAGNOSIS — E785 Hyperlipidemia, unspecified: Secondary | ICD-10-CM | POA: Insufficient documentation

## 2016-12-19 DIAGNOSIS — E1140 Type 2 diabetes mellitus with diabetic neuropathy, unspecified: Secondary | ICD-10-CM | POA: Insufficient documentation

## 2016-12-19 DIAGNOSIS — E1169 Type 2 diabetes mellitus with other specified complication: Secondary | ICD-10-CM | POA: Insufficient documentation

## 2016-12-19 DIAGNOSIS — Z8673 Personal history of transient ischemic attack (TIA), and cerebral infarction without residual deficits: Secondary | ICD-10-CM | POA: Insufficient documentation

## 2018-04-20 DIAGNOSIS — K219 Gastro-esophageal reflux disease without esophagitis: Secondary | ICD-10-CM | POA: Insufficient documentation

## 2019-05-15 ENCOUNTER — Encounter: Payer: Self-pay | Admitting: Family Medicine

## 2019-05-15 ENCOUNTER — Ambulatory Visit (INDEPENDENT_AMBULATORY_CARE_PROVIDER_SITE_OTHER): Payer: Medicare PPO | Admitting: Family Medicine

## 2019-05-15 ENCOUNTER — Other Ambulatory Visit: Payer: Self-pay

## 2019-05-15 DIAGNOSIS — E113499 Type 2 diabetes mellitus with severe nonproliferative diabetic retinopathy without macular edema, unspecified eye: Secondary | ICD-10-CM

## 2019-05-15 DIAGNOSIS — I10 Essential (primary) hypertension: Secondary | ICD-10-CM

## 2019-05-15 DIAGNOSIS — M546 Pain in thoracic spine: Secondary | ICD-10-CM | POA: Diagnosis not present

## 2019-05-15 NOTE — Progress Notes (Signed)
Patient ID: Regina [name], female   DOB: 1941/07/11, 78 y.o.   MRN: [id number]   This visit type was conducted due to national recommendations for restrictions regarding the COVID-19 pandemic in an effort to limit this patient's exposure and mitigate transmission in our community.   Virtual Visit via Video Note  I connected with Jerlene Rockers on 05/15/19 at  1:45 PM EDT by a video enabled telemedicine application and verified that I am speaking with the correct person using two identifiers.  Location patient: home Location provider:work or home office Persons participating in the virtual visit: patient, provider  I discussed the limitations of evaluation and management by telemedicine and the availability of in person appointments. The patient expressed understanding and agreed to proceed.   HPI: Patient is actually waiting to establish care.  Her daughter lives in [place] and patient is from the [place] area but has been staying with daughter during quarantine and plans to be here for quite some time.  She apparently has future visit scheduled already with Dr. [name] August 4.  Her chronic problems include hypertension, obstructive sleep apnea, hyperlipidemia, type 2 diabetes, hypothyroidism.  She had called because of 6-week history of some right-sided back pain.  She states that her pain is from around her bra line and radiating down to the lower lumbar area.  She describes intermittent pain that is sometimes "stabbing "quality.  Symptoms can be very transient.  Seem to be worse with movement.  Denies any injury.  Denies any recent skin rashes, abdominal pain, fever, chills, cough, dyspnea, or any appetite changes.  She did recently go on Ozempic and has had about 12 pounds of weight loss probably related to the [place].  She apparently is on gabapentin 400 mg twice daily though she denies any active neuropathy symptoms.  She states she takes Flexeril 10 mg every night for  history of muscle "spasms ".   ROS: See pertinent positives and negatives per HPI.  Past Medical History:  Diagnosis Date  . Diabetes mellitus without complication   . H/O echocardiogram    a. 01/2015 Echo: EF 55-60%, Gr 2 DD, mod LVH, mildly dil LA.  Marland Kitchen Hypertension   . Non-obstructive CAD    a. 01/2015 Cardiolite: + inf wall ischemia, EF 56%;  b. 01/2015 Cath: LM nl, LAD 21m, LCX min irregs, RCA dominant, 50-28m.  . Sleep apnea   . Thyroid disease     Past Surgical History:  Procedure Laterality Date  . ABDOMINAL HYSTERECTOMY    . ESOPHAGOGASTRODUODENOSCOPY     a. 01/2015 EGD: patent esophagus.  . ESOPHAGOGASTRODUODENOSCOPY N/A 01/20/2015   Procedure: ESOPHAGOGASTRODUODENOSCOPY (EGD);  Surgeon: Carol Ada, MD;  Location: [facility] ENDOSCOPY;  Service: Endoscopy;  Laterality: N/A;  . LEFT HEART CATHETERIZATION WITH CORONARY ANGIOGRAM N/A 01/19/2015  . THYROIDECTOMY      No family history on file.  SOCIAL HX: Currently living with daughter in [place].  No history of smoking   Current Outpatient Medications:  .  aspirin 81 MG tablet, Take 81 mg by mouth daily., Disp: , Rfl:  .  atorvastatin (LIPITOR) 10 MG tablet, TAKE 1 TABLET BY MOUTH EVERY DAY AT 6PM, Disp: 30 tablet, Rfl: 1 .  Cholecalciferol (VITAMIN D3) 5000 UNITS CAPS, Take 5,000 Units by mouth daily at 12 noon., Disp: , Rfl:  .  cyclobenzaprine (FLEXERIL) 10 MG tablet, Take 10 mg by mouth at bedtime as needed for muscle spasms. , Disp: , Rfl:  .  gabapentin (NEURONTIN) 400  MG capsule, Take 400 mg by mouth at bedtime. , Disp: , Rfl:  .  lactulose (CHRONULAC) 10 GM/15ML solution, Take 20 g by mouth daily., Disp: , Rfl:  .  levothyroxine (SYNTHROID, LEVOTHROID) 125 MCG tablet, Take 125 mcg by mouth daily before breakfast., Disp: , Rfl:  .  losartan (COZAAR) 100 MG tablet, Take 100 mg by mouth daily., Disp: , Rfl:  .  meclizine (ANTIVERT) 25 MG tablet, Take 25 mg by mouth 3 (three) times daily as needed for dizziness., Disp: ,  Rfl:  .  meloxicam (MOBIC) 15 MG tablet, Take 15 mg by mouth daily at 12 noon., Disp: , Rfl:  .  omeprazole (PRILOSEC) 20 MG capsule, Take 20 mg by mouth daily., Disp: , Rfl:  .  terazosin (HYTRIN) 5 MG capsule, Take 5 mg by mouth at bedtime., Disp: , Rfl:     ASSESSMENT AND PLAN:  Discussed the following assessment and plan:  Right-sided back pain of 6 weeks duration.  Question musculoskeletal.  She does not have any red flags such as unexplained weight loss, fever, skin rash, abdominal pain, chest pain.  -Given her age and duration of symptoms recommend further evaluation -They already have new patient visit with Dr. [name] scheduled August 4 and recommended keeping that visit for further evaluation -We discussed red flags and things to watch for -Would not recommend any empiric medications until further evaluated    I discussed the assessment and treatment plan with the patient. The patient was provided an opportunity to ask questions and all were answered. The patient agreed with the plan and demonstrated an understanding of the instructions.   The patient was advised to call back or seek an in-person evaluation if the symptoms worsen or if the condition fails to improve as anticipated.   Carolann Littler, MD

## 2019-05-21 ENCOUNTER — Other Ambulatory Visit: Payer: Self-pay

## 2019-05-21 ENCOUNTER — Encounter: Payer: Self-pay | Admitting: Family Medicine

## 2019-05-21 ENCOUNTER — Ambulatory Visit (INDEPENDENT_AMBULATORY_CARE_PROVIDER_SITE_OTHER): Payer: Medicare PPO | Admitting: Family Medicine

## 2019-05-21 VITALS — BP 137/79 | HR 78

## 2019-05-21 DIAGNOSIS — E785 Hyperlipidemia, unspecified: Secondary | ICD-10-CM

## 2019-05-21 DIAGNOSIS — G4700 Insomnia, unspecified: Secondary | ICD-10-CM | POA: Insufficient documentation

## 2019-05-21 DIAGNOSIS — Z8619 Personal history of other infectious and parasitic diseases: Secondary | ICD-10-CM | POA: Insufficient documentation

## 2019-05-21 DIAGNOSIS — K219 Gastro-esophageal reflux disease without esophagitis: Secondary | ICD-10-CM | POA: Diagnosis not present

## 2019-05-21 DIAGNOSIS — E1140 Type 2 diabetes mellitus with diabetic neuropathy, unspecified: Secondary | ICD-10-CM

## 2019-05-21 DIAGNOSIS — G4730 Sleep apnea, unspecified: Secondary | ICD-10-CM | POA: Insufficient documentation

## 2019-05-21 DIAGNOSIS — I10 Essential (primary) hypertension: Secondary | ICD-10-CM

## 2019-05-21 DIAGNOSIS — E039 Hypothyroidism, unspecified: Secondary | ICD-10-CM | POA: Diagnosis not present

## 2019-05-21 DIAGNOSIS — E559 Vitamin D deficiency, unspecified: Secondary | ICD-10-CM

## 2019-05-21 DIAGNOSIS — N183 Chronic kidney disease, stage 3 unspecified: Secondary | ICD-10-CM

## 2019-05-21 DIAGNOSIS — Z794 Long term (current) use of insulin: Secondary | ICD-10-CM

## 2019-05-21 DIAGNOSIS — E1169 Type 2 diabetes mellitus with other specified complication: Secondary | ICD-10-CM

## 2019-05-21 DIAGNOSIS — M545 Low back pain, unspecified: Secondary | ICD-10-CM

## 2019-05-21 DIAGNOSIS — N184 Chronic kidney disease, stage 4 (severe): Secondary | ICD-10-CM | POA: Insufficient documentation

## 2019-05-21 NOTE — Assessment & Plan Note (Signed)
BP adequately controlled based on reported readings. Low salt diet. Continue monitoring BP. No changes in current management.

## 2019-05-21 NOTE — Progress Notes (Signed)
Virtual Visit via Video Note   I connected with Regina [name] on 05/25/19 at 12:00 PM EDT by a video enabled telemedicine application and verified that I am speaking with the correct person using two identifiers.  Location patient: home Location provider:work office Persons participating in the virtual visit: patient, provider  I discussed the limitations of evaluation and management by telemedicine and the availability of in person appointments. The patient expressed understanding and agreed to proceed.   HPI: Regina [name] is a 78 yo female who is establishing care today. Former PCP: Dr. Lacinda Axon. She is not sure about her last CPE.  She is now living with her daughter, she moved to the area in 12/2018.  She has history of DM 2, hypertension, CHF,SOB,hyperlipidemia, gout, OA, memory difficulties, and constipation among some. Last time she had labs done, 04/2018.  Vertigo on Meclizine 25 mg daily as needed. COPD on Anora ellipta 52.5/25 mcg daily,it helps with SOB, She is not having cough or wheezing.  GERD on Omeprazole 20 mg daily. Negative for changes in bowel habits or melana.  She is on Gabapentin to treat peripheral neuropathy.  DM II, BS's are "good." HgA1C last done in 04/2018, 7.0 She is on Tradjenta 5 mg daily,Ozempic 2 1 mg weekly,Lantus 30 U daily. Denies abdominal pain, nausea,vomiting, polydipsia,polyuria, or polyphagia.  + CKD 3,she has not noted decreased urine ouput,gross hematuria,or foam in urine. She is on Lactulose 15 ml daily for constipation.  HTN,she is on Terazosin 5 mg daily and Adarbyclor 40-25 mg daily. Denies severe/frequent headache, visual changes, chest pain, dyspnea, focal weakness, or worsening edema. She takes Furosemide 20 mg daily.  She does check BP occasionally. She takes Aricept 5 mg daily,states that it is to help with memory.  Basic Metabolic Panel (BMP)Resulted: 04/27/2018 6:45 AM [place]  Component Name Value Ref  Range  Sodium 136 135 - 145 mmol/L  Potassium 4.7 3.5 - 5 mmol/L  Chloride 101 98 - 108 mmol/L  Carbon Dioxide (CO2) 29 21 - 30 mmol/L  Urea Nitrogen (BUN) 45 (H) 7 - 20 mg/dL  Creatinine 1.7 (H) 0.4 - 1 mg/dL  Glucose 134  Comment:  Interpretive Data: Please note that the above listed reference range                  for NONFASTING GLUCOSE levels only.  FASTING GLUCOSE reference ranges are shown below:             FASTING GLUCOSE REFERENCE RANGE        NORMAL:       70 - 99 mg/dL        PREDIABETES:    100 - 125 mg/dL      DIABETES:     > 125   mg/dL 70 - 140 mg/dL  Calcium 8.3 (L) 8.7 - 10.2 mg/dL  Anion Gap 6 3 - 12 mmol/L  BUN/CREA Ratio 26 20 - 30   BUN/CREA Ratio 27 <30   Glomerular Filtration Rate ( 34    Vit D def,she is on Vit D3 1000 U daily.  HLD, she is on Atorvastatin 20 mg daily. Tolerating medication well.  Gout,she takes Allopurinol 100 mg daily. She has not had a gout attack in a while.  Hypothyroidism, she is on Levothyroxine 150 mcg daily.  Negative for abnormal wt loss, unusual fatigue,palpitations,tremor,cold/heat intolerance.  Today she is c/o bilateral (R>L) low back pain for about 6 weeks. Stabbing like pain, "really bad." Pain is exacerbated  by certain movements.  She denies associated numbness,tingling,saddle anesthesia,or changes in urine/bowel continence. States that this is a new problem and has not had imaging. Denies associated fever,chills,local skin edema,or erythema. No Hx of trauma.    Hx of OA, mainly knees,s/p TNR She follows with ortho. She has tried OTC topical medications. Takes Flexeril 5 mg daily at bedtime.   ROS: See pertinent positives and negatives per HPI.  Past Medical History:  Diagnosis Date  . Diabetes mellitus without complication ([place])   . H/O echocardiogram    a. 01/2015 Echo: EF 55-60%, Gr 2 DD, mod LVH, mildly dil LA.  Marland Kitchen Hypertension   .  Non-obstructive CAD    a. 01/2015 Cardiolite: + inf wall ischemia, EF 56%;  b. 01/2015 Cath: LM nl, LAD 82m, LCX min irregs, RCA dominant, 50-35m.  . Sleep apnea   . Thyroid disease     Past Surgical History:  Procedure Laterality Date  . ABDOMINAL HYSTERECTOMY    . ESOPHAGOGASTRODUODENOSCOPY     a. 01/2015 EGD: patent esophagus.  . ESOPHAGOGASTRODUODENOSCOPY N/A 01/20/2015   Procedure: ESOPHAGOGASTRODUODENOSCOPY (EGD);  Surgeon: Carol Ada, MD;  Location: [facility] ENDOSCOPY;  Service: Endoscopy;  Laterality: N/A;  . LEFT HEART CATHETERIZATION WITH CORONARY ANGIOGRAM N/A 01/19/2015  . THYROIDECTOMY      History reviewed. No pertinent family history.  Social History   Socioeconomic History  . Marital status: Widowed    Spouse name: Not on file  . Number of children: Not on file  . Years of education: Not on file  . Highest education level: Not on file  Occupational History  . Not on file  Social Needs  . Financial resource strain: Not on file  . Food insecurity    Worry: Not on file    Inability: Not on file  . Transportation needs    Medical: Not on file    Non-medical: Not on file  Tobacco Use  . Smoking status: Never Smoker  . Smokeless tobacco: Never Used  Substance and Sexual Activity  . Alcohol use: No  . Drug use: Not on file  . Sexual activity: Not on file  Lifestyle  . Physical activity    Days per week: Not on file    Minutes per session: Not on file  . Stress: Not on file  Relationships  . Social Herbalist on phone: Not on file    Gets together: Not on file    Attends religious service: Not on file    Active member of club or organization: Not on file    Attends meetings of clubs or organizations: Not on file    Relationship status: Not on file  . Intimate partner violence    Fear of current or ex partner: Not on file    Emotionally abused: Not on file    Physically abused: Not on file    Forced sexual activity: Not on file  Other Topics Concern   . Not on file  Social History Narrative  . Not on file      Current Outpatient Medications:  .  allopurinol (ZYLOPRIM) 100 MG tablet, Take 100 mg by mouth daily., Disp: , Rfl:  .  atorvastatin (LIPITOR) 20 MG tablet, Take 20 mg by mouth daily., Disp: , Rfl:  .  Azilsartan-Chlorthalidone 40-25 MG TABS, Take 1 tablet by mouth daily., Disp: , Rfl:  .  donepezil (ARICEPT) 5 MG tablet, Take 5 mg by mouth daily., Disp: , Rfl:  .  furosemide (  LASIX) 20 MG tablet, Take 20 mg by mouth 3 (three) times a week., Disp: , Rfl:  .  lactulose (CHRONULAC) 10 GM/15ML solution, Take 15 mLs by mouth daily as needed for constipation., Disp: , Rfl:  .  levothyroxine (SYNTHROID) 150 MCG tablet, Take 150 mcg by mouth daily., Disp: , Rfl:  .  linagliptin (TRADJENTA) 5 MG TABS tablet, Take 5 mg by mouth daily., Disp: , Rfl:  .  loratadine (CLARITIN) 10 MG tablet, Take 10 mg by mouth daily as needed., Disp: , Rfl:  .  meclizine (ANTIVERT) 25 MG tablet, Take 25 mg by mouth daily as needed., Disp: , Rfl:  .  terazosin (HYTRIN) 5 MG capsule, Take 5 mg by mouth daily., Disp: , Rfl:  .  ANORO ELLIPTA 62.5-25 MCG/INH AEPB, Inhale 1 puff into the lungs daily., Disp: , Rfl:  .  aspirin 81 MG tablet, Take 81 mg by mouth daily., Disp: , Rfl:  .  cyclobenzaprine (FLEXERIL) 10 MG tablet, Take 10 mg by mouth at bedtime as needed for muscle spasms. , Disp: , Rfl:  .  cyclobenzaprine (FLEXERIL) 5 MG tablet, Take 5 mg by mouth 2 (two) times daily., Disp: , Rfl:  .  gabapentin (NEURONTIN) 600 MG tablet, Take 600 mg by mouth 2 (two) times daily., Disp: , Rfl:  .  Insulin Glargine, 2 Unit Dial, 300 UNIT/ML SOPN, Inject 30 Units into the skin daily., Disp: , Rfl:  .  lactulose (CHRONULAC) 10 GM/15ML solution, Take 20 g by mouth daily., Disp: , Rfl:  .  magnesium oxide (MAG-OX) 400 (241.3 Mg) MG tablet, Take 1 tablet by mouth daily., Disp: , Rfl:  .  omeprazole (PRILOSEC) 20 MG capsule, Take 20 mg by mouth daily., Disp: , Rfl:  .   OZEMPIC, 1 MG/DOSE, 2 MG/1.5ML SOPN, Inject 2 mg into the skin once a week., Disp: , Rfl:   EXAM:  VITALS per patient if applicable:BP 867/61   Pulse 78   GENERAL: alert, oriented, appears well and in no acute distress  HEENT: atraumatic, conjunctiva clear, no obvious facial abnormalities on inspection.  NECK: normal movements of the head and neck  LUNGS: on inspection no signs of respiratory distress, breathing rate appears normal, no obvious gross SOB, gasping or wheezing  CV: no obvious cyanosis  Regina: moves all visible extremities without noticeable abnormality  PSYCH/NEURO: pleasant and cooperative, no obvious depression or anxiety, speech and thought processing grossly intact  ASSESSMENT AND PLAN:  Discussed the following assessment and plan: Orders Placed This Encounter  Procedures  . DG Lumbar Spine Complete  . Comprehensive metabolic panel  . Lipid panel  . Hemoglobin A1c  . Fructosamine  . Microalbumin / creatinine urine ratio  . TSH  . VITAMIN D 25 Hydroxy (Vit-D Deficiency, Fractures)  . Parathyroid hormone, intact (no Ca)  . CBC   Lab Results  Component Value Date   TSH 2.73 05/22/2019   Lab Results  Component Value Date   HGBA1C 7.7 (H) 05/22/2019     Lab Results  Component Value Date   WBC 8.0 05/22/2019   HGB 11.0 (L) 05/22/2019   HCT 33.2 (L) 05/22/2019   MCV 86.9 05/22/2019   PLT 295.0 05/22/2019   Lab Results  Component Value Date   ALT 12 05/22/2019   AST 20 05/22/2019   ALKPHOS 71 05/22/2019   BILITOT 0.3 05/22/2019   Lab Results  Component Value Date   CREATININE 1.63 (H) 05/22/2019   BUN 33 (H) 05/22/2019  NA 135 05/22/2019   K 4.1 05/22/2019   CL 98 05/22/2019   CO2 30 05/22/2019    Lab Results  Component Value Date   CHOL 98 05/22/2019   HDL 44.70 05/22/2019   LDLCALC 30 05/22/2019   TRIG 119.0 05/22/2019   CHOLHDL 2 05/22/2019     Vitamin D deficiency, unspecified - Plan: VITAMIN D 25 Hydroxy (Vit-D Deficiency,  Fractures) Continue Vit D3 1000 U daily.  Bilateral low back pain without sciatica, unspecified chronicity - Plan: DG Lumbar Spine Complete. Because reporting problem as new, lumbar X ray will be arranged. Tylenol 500 mg tid as needed. Continue Flexeril 5 mg at bedtime. Instructed about warning signs.   Essential hypertension BP adequately controlled based on reported readings. Low salt diet. Continue monitoring BP. No changes in current management.  GERD (gastroesophageal reflux disease) Problem well controlled. Continue Omeprazole 20 mg daily. GERD precautions.  Hyperlipidemia associated with type 2 diabetes mellitus (HCC) Continue Atorvastatin 20 mg daily. Low fat diet. Lab appt will be arranged.  Hypothyroidism Continue Levothyroxine 150 mcg daily. TSH will be arranged and recommendations will be given accordingly.  Type 2 diabetes mellitus with diabetic neuropathy, unspecified ([place]) HgA1C 7.0 in 04/2018. Lab appt will be arranged. No changes in current management. Regular exercise and healthy diet with avoidance of added sugar food intake is an important part of treatment and recommended. Annual eye exam, periodic dental and foot care recommended. F/U in 4 months   CKD (chronic kidney disease), stage III (HCC) Adequate BP and glucose controlled. Avoid NSAID's. Adequate hydration and low salt diet.   42 min face to face virtual visit. I discussed the assessment and treatment plan with the patient. The patient was provided an opportunity to ask questions and all were answered. The patient agreed with the plan and demonstrated an understanding of the instructions.     Return in about 4 months (around 09/20/2019) for F/U + needs lab appt..    [name], MD

## 2019-05-21 NOTE — Assessment & Plan Note (Signed)
HgA1C 7.0 in 04/2018. Lab appt will be arranged. No changes in current management. Regular exercise and healthy diet with avoidance of added sugar food intake is an important part of treatment and recommended. Annual eye exam, periodic dental and foot care recommended. F/U in 4 months

## 2019-05-21 NOTE — Assessment & Plan Note (Signed)
Continue Atorvastatin 20 mg daily. Low fat diet. Lab appt will be arranged.

## 2019-05-21 NOTE — Assessment & Plan Note (Signed)
Problem well controlled. Continue Omeprazole 20 mg daily. GERD precautions.

## 2019-05-21 NOTE — Assessment & Plan Note (Signed)
Continue Levothyroxine 150 mcg daily. TSH will be arranged and recommendations will be given accordingly.

## 2019-05-21 NOTE — Assessment & Plan Note (Signed)
Adequate BP and glucose controlled. Avoid NSAID's. Adequate hydration and low salt diet.

## 2019-05-22 ENCOUNTER — Ambulatory Visit (INDEPENDENT_AMBULATORY_CARE_PROVIDER_SITE_OTHER): Payer: Medicare PPO

## 2019-05-22 ENCOUNTER — Other Ambulatory Visit (INDEPENDENT_AMBULATORY_CARE_PROVIDER_SITE_OTHER): Payer: Medicare PPO

## 2019-05-22 ENCOUNTER — Ambulatory Visit: Payer: Self-pay

## 2019-05-22 ENCOUNTER — Other Ambulatory Visit: Payer: Self-pay

## 2019-05-22 DIAGNOSIS — M545 Low back pain, unspecified: Secondary | ICD-10-CM

## 2019-05-22 DIAGNOSIS — E039 Hypothyroidism, unspecified: Secondary | ICD-10-CM

## 2019-05-22 DIAGNOSIS — N183 Chronic kidney disease, stage 3 unspecified: Secondary | ICD-10-CM

## 2019-05-22 DIAGNOSIS — E1169 Type 2 diabetes mellitus with other specified complication: Secondary | ICD-10-CM | POA: Diagnosis not present

## 2019-05-22 DIAGNOSIS — E785 Hyperlipidemia, unspecified: Secondary | ICD-10-CM | POA: Diagnosis not present

## 2019-05-22 DIAGNOSIS — E559 Vitamin D deficiency, unspecified: Secondary | ICD-10-CM

## 2019-05-22 DIAGNOSIS — Z794 Long term (current) use of insulin: Secondary | ICD-10-CM

## 2019-05-22 DIAGNOSIS — E1140 Type 2 diabetes mellitus with diabetic neuropathy, unspecified: Secondary | ICD-10-CM

## 2019-05-22 DIAGNOSIS — I10 Essential (primary) hypertension: Secondary | ICD-10-CM | POA: Diagnosis not present

## 2019-05-23 LAB — CBC
HCT: 33.2 % — ABNORMAL LOW (ref 36.0–46.0)
Hemoglobin: 11 g/dL — ABNORMAL LOW (ref 12.0–15.0)
MCHC: 33.2 g/dL (ref 30.0–36.0)
MCV: 86.9 fl (ref 78.0–100.0)
Platelets: 295 10*3/uL (ref 150.0–400.0)
RBC: 3.82 Mil/uL — ABNORMAL LOW (ref 3.87–5.11)
RDW: 14.7 % (ref 11.5–15.5)
WBC: 8 10*3/uL (ref 4.0–10.5)

## 2019-05-23 LAB — COMPREHENSIVE METABOLIC PANEL
ALT: 12 U/L (ref 0–35)
AST: 20 U/L (ref 0–37)
Albumin: 4.2 g/dL (ref 3.5–5.2)
Alkaline Phosphatase: 71 U/L (ref 39–117)
BUN: 33 mg/dL — ABNORMAL HIGH (ref 6–23)
CO2: 30 mEq/L (ref 19–32)
Calcium: 9.8 mg/dL (ref 8.4–10.5)
Chloride: 98 mEq/L (ref 96–112)
Creatinine, Ser: 1.63 mg/dL — ABNORMAL HIGH (ref 0.40–1.20)
GFR: 37.15 mL/min — ABNORMAL LOW (ref >60.00)
Glucose, Bld: 136 mg/dL — ABNORMAL HIGH (ref 70–99)
Potassium: 4.1 mEq/L (ref 3.5–5.1)
Sodium: 135 mEq/L (ref 135–145)
Total Bilirubin: 0.3 mg/dL (ref 0.2–1.2)
Total Protein: 8.7 g/dL — ABNORMAL HIGH (ref 6.0–8.3)

## 2019-05-23 LAB — TSH: TSH: 2.73 u[IU]/mL (ref 0.35–4.50)

## 2019-05-23 LAB — MICROALBUMIN / CREATININE URINE RATIO
Creatinine,U: 31.3 mg/dL
Microalb Creat Ratio: 6 mg/g (ref 0.0–30.0)
Microalb, Ur: 1.9 mg/dL (ref 0.0–1.9)

## 2019-05-23 LAB — LIPID PANEL
Cholesterol: 98 mg/dL (ref 0–200)
HDL: 44.7 mg/dL (ref >39.00)
LDL Cholesterol: 30 mg/dL (ref 0–99)
NonHDL: 53.61
Total CHOL/HDL Ratio: 2
Triglycerides: 119 mg/dL (ref 0.0–149.0)
VLDL: 23.8 mg/dL (ref 0.0–40.0)

## 2019-05-23 LAB — VITAMIN D 25 HYDROXY (VIT D DEFICIENCY, FRACTURES): VITD: 59.65 ng/mL (ref 30.00–100.00)

## 2019-05-23 LAB — HEMOGLOBIN A1C: Hgb A1c MFr Bld: 7.7 % — ABNORMAL HIGH (ref 4.6–6.5)

## 2019-05-28 ENCOUNTER — Other Ambulatory Visit: Payer: Self-pay | Admitting: *Deleted

## 2019-05-28 DIAGNOSIS — M546 Pain in thoracic spine: Secondary | ICD-10-CM

## 2019-05-28 DIAGNOSIS — M545 Low back pain, unspecified: Secondary | ICD-10-CM

## 2019-05-28 LAB — FRUCTOSAMINE: Fructosamine: 349 umol/L — ABNORMAL HIGH (ref 205–285)

## 2019-05-28 LAB — PARATHYROID HORMONE, INTACT (NO CA): PTH: 30 pg/mL (ref 14–64)

## 2019-05-28 MED ORDER — CYCLOBENZAPRINE HCL 5 MG PO TABS: 5.0000 mg | ORAL_TABLET | Freq: Two times a day (BID) | ORAL | 3 refills | Status: DC

## 2019-06-03 ENCOUNTER — Ambulatory Visit: Payer: Medicare Other | Admitting: Family Medicine

## 2019-06-10 ENCOUNTER — Other Ambulatory Visit: Payer: Self-pay | Admitting: *Deleted

## 2019-06-10 MED ORDER — ACCU-CHEK FASTCLIX LANCET KIT: PACK | 1 refills | Status: DC

## 2019-06-10 MED ORDER — ACCU-CHEK GUIDE VI STRP: ORAL_STRIP | 3 refills | Status: DC

## 2019-06-10 MED ORDER — BD SWAB SINGLE USE REGULAR PADS: MEDICATED_PAD | 3 refills | Status: DC

## 2019-06-10 MED ORDER — ACCU-CHEK GUIDE W/DEVICE KIT: 1.0000 | PACK | Freq: Three times a day (TID) | 0 refills | Status: DC

## 2019-06-10 MED ORDER — ACCU-CHEK GUIDE CONTROL VI LIQD: 1.0000 | Freq: Once | 0 refills | Status: AC

## 2019-06-17 ENCOUNTER — Other Ambulatory Visit: Payer: Self-pay

## 2019-06-17 ENCOUNTER — Encounter: Payer: Self-pay | Admitting: Physical Therapy

## 2019-06-17 ENCOUNTER — Telehealth: Payer: Self-pay | Admitting: Family Medicine

## 2019-06-17 ENCOUNTER — Ambulatory Visit: Payer: Medicare PPO | Attending: Family Medicine | Admitting: Physical Therapy

## 2019-06-17 DIAGNOSIS — M545 Low back pain, unspecified: Secondary | ICD-10-CM

## 2019-06-17 NOTE — Therapy (Signed)
[place], [place], [address] Phone: [phone]   Fax:  [phone]  Physical Therapy Evaluation  Patient Details  Name: Regina [name] MRN: [id number] Date of Birth: [birth year] Referring Provider (PT): [name], MD   Encounter Date: 06/17/2019  PT End of Session - 06/17/19 1014    Visit Number  1    Number of Visits  12    Date for PT Re-Evaluation  07/29/19    Authorization Type  [employer] MCR    PT Start Time  0935    PT Stop Time  1020    PT Time Calculation (min)  45 min    Activity Tolerance  Patient tolerated treatment well    Behavior During Therapy  [facility] for tasks assessed/performed       Past Medical History:  Diagnosis Date  . Diabetes mellitus without complication ([place])   . H/O echocardiogram    a. 01/2015 Echo: EF 55-60%, Gr 2 DD, mod LVH, mildly dil LA.  Marland Kitchen Hypertension   . Non-obstructive CAD    a. 01/2015 Cardiolite: + inf wall ischemia, EF 56%;  b. 01/2015 Cath: LM nl, LAD 90m, LCX min irregs, RCA dominant, 50-3m.  . Sleep apnea   . Thyroid disease     Past Surgical History:  Procedure Laterality Date  . ABDOMINAL HYSTERECTOMY    . ESOPHAGOGASTRODUODENOSCOPY     a. 01/2015 EGD: patent esophagus.  . ESOPHAGOGASTRODUODENOSCOPY N/A 01/20/2015   Procedure: ESOPHAGOGASTRODUODENOSCOPY (EGD);  Surgeon: Carol Ada, MD;  Location: [facility] ENDOSCOPY;  Service: Endoscopy;  Laterality: N/A;  . JOINT REPLACEMENT     reports history bilateral TKA and right TSA  . LEFT HEART CATHETERIZATION WITH CORONARY ANGIOGRAM N/A 01/19/2015  . THYROIDECTOMY      There were no vitals filed for this visit.   Subjective Assessment - 06/17/19 0934    Subjective  Pt. is a 78 y/o female that lives in [place] currently staying with her daughter during the Covid pandemic. She had onset of mid to low back pain around June-no specific mechanism of injury noted, experienced pain when sitting in chair.X-rays  showed degenerative changes but no acute changes. Pain increased in general with movement, no specific eases noted. No LE radiating symptoms noted.    Pertinent History  DM, HTN, HLD, OSA, obesity, history hepatitis C, CAD, CKD III    Limitations  Sitting;Lifting;Standing;Walking;House hold activities    Diagnostic tests  X-rays    Patient Stated Goals  "Be more mobile" and get pain out of back    Currently in Pain?  Yes    Pain Score  2     Pain Location  Back    Pain Orientation  Lower;Right    Pain Descriptors / Indicators  Sore    Pain Type  Acute pain    Pain Onset  More than a month ago    Aggravating Factors   movement, activity    Pain Relieving Factors  no eases    Effect of Pain on Daily Activities  Limits activity tolerance for ADLs         [facility] PT Assessment - 06/17/19 0001      Assessment   Medical Diagnosis  Bilateral LBP without sciatica    Referring Provider (PT)  [name], MD    Onset Date/Surgical Date  04/01/19   estimated   Hand Dominance  Right    Prior Therapy  none      Precautions  Precautions  None      Restrictions   Weight Bearing Restrictions  No      Balance Screen   Has the patient fallen in the past 6 months  No      [place] residence    Living Arrangements  Children    Type of [place] - single point    Additional Comments  staying with daughter during pandemic, 6 steps to enter with bilateral rail, 17 steps to bedroom on right      Prior Function   Level of Independence  Independent with basic ADLs      Cognition   Overall Cognitive Status  Within Functional Limits for tasks assessed      Observation/Other Assessments   Focus on Therapeutic Outcomes (FOTO)   54% limited      Sensation   Light Touch  Appears Intact    Additional Comments  mild decreased sensation to light touch medial aspect of left knee with dermatomal screen-pt. reports had some lingering  numbness after TKA      Posture/Postural Control   Posture Comments  Increased upper thoracic kyphosis      ROM / Strength   AROM / PROM / Strength  AROM;PROM;Strength      AROM   AROM Assessment Site  Lumbar    Lumbar Flexion  80    Lumbar Extension  20   increased right LBP   Lumbar - Right Side Bend  22    Lumbar - Left Side Bend  28    Lumbar - Right Rotation  WFL    Lumbar - Left Rotation  WFL      PROM   Overall PROM Comments  Bilat. hip PROM grossly WFL, mild hamstring tightness with SLR 70 deg right, 80 deg left      Strength   Strength Assessment Site  Hip;Knee;Ankle    Right/Left Hip  Right;Left    Right Hip Flexion  4+/5    Right Hip External Rotation   4+/5    Right Hip Internal Rotation  4+/5    Left Hip Flexion  4+/5    Left Hip External Rotation  5/5    Left Hip Internal Rotation  5/5    Right/Left Knee  Right;Left    Right Knee Flexion  5/5    Right Knee Extension  5/5    Left Knee Flexion  5/5    Left Knee Extension  5/5    Right/Left Ankle  Right;Left    Right Ankle Dorsiflexion  5/5    Right Ankle Inversion  5/5    Right Ankle Eversion  5/5    Left Ankle Dorsiflexion  5/5    Left Ankle Inversion  5/5    Left Ankle Eversion  5/5      Palpation   Palpation comment  tightness with associated muscle spasm/tenderness to palpation right paraspinals at T10-L2 levels                Objective measurements completed on examination: See above findings.      [place] Adult PT Treatment/Exercise - 06/17/19 0001      Exercises   Exercises  Lumbar      Lumbar Exercises: Stretches   Other Lumbar Stretch Exercise  HEP instruction and brief practice pelvic tilt, LTR, SKTC      Manual Therapy   Manual Therapy  Soft tissue mobilization  Soft tissue mobilization  STM/trigger point ischemic compression right thoracolumbar paraspinals             PT Education - 06/17/19 1014    Education Details  HEP, POC, symptom etiology    Person(s)  Educated  Patient    Methods  Explanation;Demonstration;Handout    Comprehension  Verbalized understanding;Returned demonstration          PT Long Term Goals - 06/17/19 1031      PT LONG TERM GOAL #1   Title  Independent with HEP    Baseline  needs HEP    Time  6    Period  Weeks    Status  New    Target Date  07/29/19      PT LONG TERM GOAL #2   Title  Improve FOTO outcome measure score to 42% or less impairment    Baseline  54% limited    Time  6    Period  Weeks    Status  New    Target Date  07/29/19      PT LONG TERM GOAL #3   Title  Tolerate sitting at least 30-40 minutes for car travel, eating meals with pain <3/10 at worst    Baseline  pain up to 8/10 at worst    Time  6    Period  Weeks    Status  New    Target Date  07/29/19      PT LONG TERM GOAL #4   Title  Perform transfers, bed mobility at home with LBP <3/10 at worst    Baseline  pain 8/10 at worst    Time  6    Period  Weeks    Target Date  07/29/19             Plan - 06/17/19 1027    Clinical Impression Statement  Pt. presents with right thoracolumbar pain of 2 1/2 months duration-though underlying degenerative changes suspect symptoms associated with myofascial etiology. Pt. would benefit from PT to help relieve pain and address current associated functional limitations.    Personal Factors and Comorbidities  Age;Comorbidity 3+    Comorbidities  DM, OA, HTN, obesity, CKD III, CAD, hepatitis C    Examination-Activity Limitations  Bathing;Lift;Bend;Stand;Carry;Sit    Examination-Participation Restrictions  Church;Laundry;Cleaning;Community Activity    Stability/Clinical Decision Making  Stable/Uncomplicated    Clinical Decision Making  Low    Rehab Potential  Good    PT Frequency  2x / week    PT Duration  6 weeks    PT Treatment/Interventions  ADLs/Self Care Home Management;Electrical Stimulation;Cryotherapy;Ultrasound;Moist Heat;Therapeutic exercise;Therapeutic activities;Neuromuscular  re-education;Manual techniques;Dry needling;Taping;Patient/family education    PT Next Visit Plan  review HEP as needed, STM right thoracolumbar paraspinals in left sidelying, progress gentle ROM andcore strengthening, modalities prn for pain    PT Home Exercise Plan  pelvic tilts, LTR, SKTC, scapular retraction, self muscle release with tennis ball/other ball with daughter's assistance    Consulted and Agree with Plan of Care  Patient       Patient will benefit from skilled therapeutic intervention in order to improve the following deficits and impairments:  Impaired flexibility, Decreased strength, Decreased range of motion, Increased muscle spasms, Decreased activity tolerance  Visit Diagnosis: Bilateral low back pain without sciatica, unspecified chronicity     Problem List Patient Active Problem List   Diagnosis Date Noted  . Sleep apnea 05/21/2019  . History of hepatitis C 05/21/2019  . Insomnia 05/21/2019  .  CKD (chronic kidney disease), stage III ([place]) 05/21/2019  . GERD (gastroesophageal reflux disease) 04/20/2018  . Type 2 diabetes mellitus with diabetic neuropathy, unspecified ([place]) 12/19/2016  . History of TIA (transient ischemic attack) 12/19/2016  . Hyperlipidemia associated with type 2 diabetes mellitus ([place]) 12/19/2016  . S/P total knee replacement using cement, right 03/18/2015  . Non-obstructive CAD   . Cardiovascular stress test abnormal   . Pleuritic chest pain 01/16/2015  . Acute renal failure superimposed on stage 3 chronic kidney disease ([place]) 01/16/2015  . Obesity, Class III, BMI 40-49.9 (morbid obesity) ([place]) 01/16/2015  . Essential hypertension 01/16/2015  . Hypothyroidism 01/16/2015  . OSA on CPAP 01/16/2015  . DM type 2 (diabetes mellitus, type 2) ([place]) 01/16/2015  . Coronary artery disease involving native coronary artery without angina pectoris 10/02/2014  . Physical deconditioning 02/28/2013  . Severe obesity (BMI >= 40) ([place]) 02/27/2013  .  Osteoarthritis 02/27/2013    [name], PT, DPT 06/17/19 10:38 AM  [facility] [address] [place], [place], [address] Phone: [phone]   Fax:  [phone]  Name: Regina [name] MRN: [id number] Date of Birth: 04-29-1941

## 2019-06-18 LAB — HM DIABETES EYE EXAM

## 2019-06-18 NOTE — Telephone Encounter (Signed)
No information for patient to be completed.

## 2019-07-09 ENCOUNTER — Encounter: Payer: Self-pay | Admitting: Family Medicine

## 2019-07-18 ENCOUNTER — Ambulatory Visit: Payer: Medicare PPO

## 2019-07-22 ENCOUNTER — Ambulatory Visit: Payer: Medicare PPO | Admitting: Pulmonary Disease

## 2019-07-22 ENCOUNTER — Other Ambulatory Visit: Payer: Self-pay

## 2019-07-22 ENCOUNTER — Encounter: Payer: Self-pay | Admitting: Pulmonary Disease

## 2019-07-22 VITALS — BP 144/60 | HR 93 | Ht 61.0 in | Wt 238.6 lb

## 2019-07-22 DIAGNOSIS — R911 Solitary pulmonary nodule: Secondary | ICD-10-CM

## 2019-07-22 DIAGNOSIS — R0600 Dyspnea, unspecified: Secondary | ICD-10-CM | POA: Diagnosis not present

## 2019-07-22 DIAGNOSIS — R0609 Other forms of dyspnea: Secondary | ICD-10-CM

## 2019-07-22 NOTE — Progress Notes (Signed)
Subjective:     Patient ID: Regina [name], female   DOB: 28-Jan-1941, 78 y.o.   MRN: [id number]  Patient with a history of obstructive sleep apnea History of lung nodule  Into establish care  Patient is compliant with CPAP use She is unsure the severity of her sleep disordered breathing She does use CPAP on a regular basis We do not have a download, no smart card in her machine Has been using CPAP for about 2 years Feels better with CPAP  She was told about a lung nodule about 2 years ago She is had no follow-up for this nodule  Shortness of breath on moderate exertion, about 14 steps Shortness of breath when she is walking on level ground as well  Never smoker No pertinent occupational history  Past Medical History:  Diagnosis Date  . Diabetes mellitus without complication ([place])   . H/O echocardiogram    a. 01/2015 Echo: EF 55-60%, Gr 2 DD, mod LVH, mildly dil LA.  Marland Kitchen Hypertension   . Non-obstructive CAD    a. 01/2015 Cardiolite: + inf wall ischemia, EF 56%;  b. 01/2015 Cath: LM nl, LAD 53m, LCX min irregs, RCA dominant, 50-57m.  . Sleep apnea   . Thyroid disease    Social History   Socioeconomic History  . Marital status: Widowed    Spouse name: Not on file  . Number of children: Not on file  . Years of education: Not on file  . Highest education level: Not on file  Occupational History  . Not on file  Social Needs  . Financial resource strain: Not on file  . Food insecurity    Worry: Not on file    Inability: Not on file  . Transportation needs    Medical: Not on file    Non-medical: Not on file  Tobacco Use  . Smoking status: Never Smoker  . Smokeless tobacco: Never Used  Substance and Sexual Activity  . Alcohol use: No  . Drug use: Not on file  . Sexual activity: Not on file  Lifestyle  . Physical activity    Days per week: Not on file    Minutes per session: Not on file  . Stress: Not on file  Relationships  . Social Herbalist on  phone: Not on file    Gets together: Not on file    Attends religious service: Not on file    Active member of club or organization: Not on file    Attends meetings of clubs or organizations: Not on file    Relationship status: Not on file  . Intimate partner violence    Fear of current or ex partner: Not on file    Emotionally abused: Not on file    Physically abused: Not on file    Forced sexual activity: Not on file  Other Topics Concern  . Not on file  Social History Narrative  . Not on file   No family history on file.   Review of Systems  Constitutional: Negative for fever and unexpected weight change.  HENT: Negative for congestion, dental problem, ear pain, nosebleeds, postnasal drip, rhinorrhea, sinus pressure, sneezing, sore throat and trouble swallowing.   Eyes: Negative for redness and itching.  Respiratory: Positive for shortness of breath. Negative for cough, chest tightness and wheezing.   Cardiovascular: Negative for palpitations and leg swelling.  Gastrointestinal: Negative for nausea and vomiting.  Genitourinary: Negative for dysuria.  Musculoskeletal: Negative for joint swelling.  Skin: Negative for rash.  Allergic/Immunologic: Negative.  Negative for environmental allergies, food allergies and immunocompromised state.  Neurological: Negative for headaches.  Hematological: Does not bruise/bleed easily.  Psychiatric/Behavioral: Negative for dysphoric mood. The patient is not nervous/anxious.        Objective:   Physical Exam Constitutional:      Appearance: Normal appearance.  HENT:     Head: Atraumatic.     Nose: Nose normal.     Mouth/Throat:     Mouth: Mucous membranes are dry.  Eyes:     General:        Right eye: No discharge.        Left eye: No discharge.     Pupils: Pupils are equal, round, and reactive to light.  Neck:     Musculoskeletal: Normal range of motion and neck supple. No neck rigidity or muscular tenderness.  Cardiovascular:      Rate and Rhythm: Normal rate and regular rhythm.     Pulses: Normal pulses.     Heart sounds: No murmur.  Pulmonary:     Effort: Pulmonary effort is normal. No respiratory distress.     Breath sounds: Normal breath sounds. No stridor. No wheezing or rhonchi.  Abdominal:     General: There is no distension.     Palpations: There is no mass.     Tenderness: There is no abdominal tenderness.  Musculoskeletal: Normal range of motion.        General: No swelling.  Skin:    General: Skin is warm and dry.  Neurological:     General: No focal deficit present.     Mental Status: She is alert.     Cranial Nerves: No cranial nerve deficit.  Psychiatric:        Mood and Affect: Mood normal.    Vitals:   07/22/19 1640  BP: (!) 144/60  Pulse: 93  SpO2: 94%       Assessment:     Obstructive sleep apnea -She is compliant with CPAP therapy -We will try and get some more information from primary doctor's office  -Encouraged to contact DME company that she was using previously -We will attempt to set up with a DME company locally as well  History of lung nodule that needed follow-up -Do not have previous CT available -Has not had any follow-up CTs so far  Shortness of breath on exertion -No previous history of heart disease -Never smoker, no pertinent occupational history      Plan:     Obtain pulmonary function tests  Obtain CT scan of the chest without contrast  Get more information regarding previous treatment  Importance of continuing to use CPAP on a regular basis discussed  Encourage weight loss efforts  I will see her back in the office in about 6 weeks

## 2019-07-22 NOTE — Patient Instructions (Signed)
History of obstructive sleep apnea -We will try and find a DME company that might take your machine and provide supplies -An option will be to contact your previous DME company, we will be able to send them an order for CPAP supplies -Purchasing supplies online may also be an option  History of lung nodule -We will obtain a CT scan to follow-up  Shortness of breath on exertion -We will obtain a breathing study  We will contact primary doctor to try and get some information  I will see you back in about 6 weeks  Call with significant concerns  Continue using your CPAP at present

## 2019-07-23 ENCOUNTER — Telehealth: Payer: Self-pay | Admitting: Pulmonary Disease

## 2019-07-23 ENCOUNTER — Telehealth: Payer: Self-pay

## 2019-07-23 ENCOUNTER — Ambulatory Visit
Admission: RE | Admit: 2019-07-23 | Discharge: 2019-07-23 | Disposition: A | Discharge status: Home / Self Care | Payer: Self-pay | Source: Ambulatory Visit | Attending: Pulmonary Disease | Admitting: Pulmonary Disease

## 2019-07-23 DIAGNOSIS — R911 Solitary pulmonary nodule: Secondary | ICD-10-CM

## 2019-07-23 NOTE — Telephone Encounter (Signed)
-----   Message from Satira Anis sent at 07/23/2019  2:00 PM EDT ----- Regarding: FW: CT Chest w/o Please see below.  ----- Message ----- From: Osvaldo Shipper, NT Sent: 07/23/2019  10:26 AM EDT To: Satira Anis Subject: RE: CT Chest w/o                               [facility] will you please forward this message to Dr. Ander Slade nurse. Please put in an outside order for CT Chest W so I can have prior CT images loaded so we can compare CT imaging.  I will need order [id number].  Thank you  Erline Levine   ----- Message ----- From: Satira Anis Sent: 07/23/2019   9:57 AM EDT To: Osvaldo Shipper, NT Subject: CT Chest w/o                                   Morning, Erline Levine!  Dr. Ander Slade placed an order for a CT w/o to be scheduled in the next 3-4 wks.  No other scheduling instructions & Mcarthur Rossetti is insurance.  Will you please schedule this one for me?   Thanks a bunch, [employer]

## 2019-07-23 NOTE — Telephone Encounter (Signed)
Order has been placed as requested. Nothing further is needed at this time.

## 2019-07-29 ENCOUNTER — Telehealth: Payer: Self-pay | Admitting: Family Medicine

## 2019-07-29 NOTE — Telephone Encounter (Signed)
Patient would like an order put in for a Diagnostic Breast exam.  She stated her left breast hurt.

## 2019-07-29 NOTE — Telephone Encounter (Signed)
Is it okay to place order?

## 2019-07-30 ENCOUNTER — Other Ambulatory Visit: Payer: Self-pay | Admitting: *Deleted

## 2019-07-30 DIAGNOSIS — N644 Mastodynia: Secondary | ICD-10-CM

## 2019-07-30 NOTE — Telephone Encounter (Signed)
It is ok to place order for Dx mammogram. Thanks, BJ

## 2019-07-30 NOTE — Telephone Encounter (Signed)
Ordered placed for mammogram as requested.

## 2019-08-05 NOTE — Therapy (Signed)
Regina [name], [place], [address] Phone: [phone]   Fax:  [phone]  Physical Therapy Evaluation/Discharge  Patient Details  Name: Regina [name] MRN: [id number] Date of Birth: 08/21/1941 Referring Provider (PT): [name], MD   Encounter Date: 06/17/2019    Past Medical History:  Diagnosis Date  . Diabetes mellitus without complication ([place])   . H/O echocardiogram    a. 01/2015 Echo: EF 55-60%, Gr 2 DD, mod LVH, mildly dil LA.  Marland Kitchen Hypertension   . Non-obstructive CAD    a. 01/2015 Cardiolite: + inf wall ischemia, EF 56%;  b. 01/2015 Cath: LM nl, LAD 35m LCX min irregs, RCA dominant, 50-677m . Sleep apnea   . Thyroid disease     Past Surgical History:  Procedure Laterality Date  . ABDOMINAL HYSTERECTOMY    . ESOPHAGOGASTRODUODENOSCOPY     a. 01/2015 EGD: patent esophagus.  . ESOPHAGOGASTRODUODENOSCOPY N/A 01/20/2015   Procedure: ESOPHAGOGASTRODUODENOSCOPY (EGD);  Surgeon: PaCarol AdaMD;  [facility];  Service: Endoscopy;  Laterality: N/A;  . JOINT REPLACEMENT     reports history bilateral TKA and right TSA  . LEFT HEART CATHETERIZATION WITH CORONARY ANGIOGRAM N/A 01/19/2015  . THYROIDECTOMY      There were no vitals filed for this visit.                  Objective measurements completed on examination: See above findings.                   PT Long Term Goals - 06/17/19 1031      PT LONG TERM GOAL #1   Title  Independent with HEP    Baseline  needs HEP    Time  6    Period  Weeks    Status  New    Target Date  07/29/19      PT LONG TERM GOAL #2   Title  Improve FOTO outcome measure score to 42% or less impairment    Baseline  54% limited    Time  6    Period  Weeks    Status  New    Target Date  07/29/19      PT LONG TERM GOAL #3   Title  Tolerate sitting at least 30-40 minutes for car travel, eating meals with pain <3/10 at worst     Baseline  pain up to 8/10 at worst    Time  6    Period  Weeks    Status  New    Target Date  07/29/19      PT LONG TERM GOAL #4   Title  Perform transfers, bed mobility at home with LBP <3/10 at worst    Baseline  pain 8/10 at worst    Time  6    Period  Weeks    Target Date  07/29/19               Patient will benefit from skilled therapeutic intervention in order to improve the following deficits and impairments:  Impaired flexibility, Decreased strength, Decreased range of motion, Increased muscle spasms, Decreased activity tolerance  Visit Diagnosis: Bilateral low back pain without sciatica, unspecified chronicity - Plan: PT plan of care cert/re-cert     Problem List Patient Active Problem List   Diagnosis Date Noted  . Sleep apnea 05/21/2019  . History of hepatitis C 05/21/2019  . Insomnia 05/21/2019  . CKD (chronic kidney disease), stage  III 05/21/2019  . GERD (gastroesophageal reflux disease) 04/20/2018  . Type 2 diabetes mellitus with diabetic neuropathy, unspecified ([place]) 12/19/2016  . History of TIA (transient ischemic attack) 12/19/2016  . Hyperlipidemia associated with type 2 diabetes mellitus ([place]) 12/19/2016  . S/P total knee replacement using cement, right 03/18/2015  . Non-obstructive CAD   . Cardiovascular stress test abnormal   . Pleuritic chest pain 01/16/2015  . Acute renal failure superimposed on stage 3 chronic kidney disease ([place]) 01/16/2015  . Obesity, Class III, BMI 40-49.9 (morbid obesity) ([place]) 01/16/2015  . Essential hypertension 01/16/2015  . Hypothyroidism 01/16/2015  . OSA on CPAP 01/16/2015  . DM type 2 (diabetes mellitus, type 2) ([place]) 01/16/2015  . Coronary artery disease involving native coronary artery without angina pectoris 10/02/2014  . Physical deconditioning 02/28/2013  . Severe obesity (BMI >= 40) ([place]) 02/27/2013  . Osteoarthritis 02/27/2013      PHYSICAL THERAPY DISCHARGE SUMMARY  Visits from Start of Care:  1  Current functional level related to goals / functional outcomes: Status unknown-patient did not return for further therapy after initial evaluation visit 06/17/19.   Remaining deficits: NA   Education / Equipment: NA Plan: Patient agrees to discharge.  Patient goals were not met. Patient is being discharged due to not returning since the last visit.  ?????          [name], PT, DPT 08/05/19 10:34 AM     [facility] [address] [place], [place], [address] Phone: [phone]   Fax:  [phone]  Name: Regina [name] MRN: [id number] Date of Birth: September 21, 1941

## 2019-08-13 ENCOUNTER — Telehealth: Payer: Self-pay

## 2019-08-13 NOTE — Telephone Encounter (Signed)
Copied from [place] [phone]. Topic: Appointment Scheduling - Scheduling Inquiry for Clinic >> Aug 13, 2019  1:01 PM Percell Belt A wrote: Reason for CRM: pt would like someone to setup her Breast exam at [facility] imaging and call her at [phone]

## 2019-08-14 ENCOUNTER — Other Ambulatory Visit: Payer: Self-pay

## 2019-08-14 ENCOUNTER — Ambulatory Visit (INDEPENDENT_AMBULATORY_CARE_PROVIDER_SITE_OTHER)
Admission: RE | Admit: 2019-08-14 | Discharge: 2019-08-14 | Disposition: A | Discharge status: Home / Self Care | Payer: Medicare PPO | Source: Ambulatory Visit | Attending: Pulmonary Disease | Admitting: Pulmonary Disease

## 2019-08-14 DIAGNOSIS — R911 Solitary pulmonary nodule: Secondary | ICD-10-CM

## 2019-08-15 ENCOUNTER — Telehealth: Payer: Self-pay

## 2019-08-15 NOTE — Telephone Encounter (Signed)
Copied from [place] [phone]. Topic: Appointment Scheduling - Scheduling Inquiry for Clinic >> Aug 13, 2019  1:01 PM Percell Belt A wrote: Reason for CRM: pt would like someone to setup her Breast exam at [facility] imaging and call her at [phone] >> Aug 15, 2019 12:00 PM Mathis Bud wrote: Patient is checking referral status on her breast exam to [facility] imaging.  Call back [phone]

## 2019-08-15 NOTE — Telephone Encounter (Signed)
Copied from [place] [phone]. Topic: Appointment Scheduling - Scheduling Inquiry for Clinic >> Aug 13, 2019  1:01 PM Percell Belt A wrote: Reason for CRM: pt would like someone to setup her Breast exam at [facility] imaging and call her at [phone] >> Aug 15, 2019 12:00 PM Mathis Bud wrote: Patient is checking referral status on her breast exam to [facility] imaging.  Call back [phone]

## 2019-08-19 ENCOUNTER — Other Ambulatory Visit: Payer: Self-pay | Admitting: Pulmonary Disease

## 2019-08-19 DIAGNOSIS — R911 Solitary pulmonary nodule: Secondary | ICD-10-CM

## 2019-08-19 NOTE — Telephone Encounter (Signed)
Duplicate message. 

## 2019-08-19 NOTE — Telephone Encounter (Signed)
Please advise. Ok to order?

## 2019-08-20 NOTE — Telephone Encounter (Signed)
Referral for screening mammogram is not usually needed,she can call and schedule appt. But if she needs to be referred for a screening mammogram, order can be placed as requested. Thanks, BJ

## 2019-08-21 ENCOUNTER — Other Ambulatory Visit: Payer: Self-pay | Admitting: Family Medicine

## 2019-08-21 DIAGNOSIS — N644 Mastodynia: Secondary | ICD-10-CM

## 2019-08-23 ENCOUNTER — Telehealth: Payer: Self-pay | Admitting: Pulmonary Disease

## 2019-08-23 NOTE — Telephone Encounter (Signed)
Called pt & left her vm to call me back regarding changing appt

## 2019-08-23 NOTE — Telephone Encounter (Signed)
Left message for patient to call back. CRM created. Relay message below

## 2019-08-26 ENCOUNTER — Other Ambulatory Visit: Payer: Medicare PPO

## 2019-08-27 ENCOUNTER — Other Ambulatory Visit: Payer: Self-pay

## 2019-08-27 ENCOUNTER — Ambulatory Visit: Payer: Medicare PPO

## 2019-08-27 ENCOUNTER — Ambulatory Visit
Admission: RE | Admit: 2019-08-27 | Discharge: 2019-08-27 | Disposition: A | Discharge status: Home / Self Care | Payer: Medicare PPO | Source: Ambulatory Visit | Attending: Family Medicine | Admitting: Family Medicine

## 2019-08-27 DIAGNOSIS — N644 Mastodynia: Secondary | ICD-10-CM

## 2019-08-27 NOTE — Telephone Encounter (Signed)
Pt's OV and PFT appts have been rescheduled for 11/24. Nothing further needed.

## 2019-09-06 ENCOUNTER — Other Ambulatory Visit: Payer: Self-pay

## 2019-09-07 ENCOUNTER — Other Ambulatory Visit (HOSPITAL_COMMUNITY)
Admission: RE | Admit: 2019-09-07 | Discharge: 2019-09-07 | Disposition: A | Discharge status: Home / Self Care | Payer: Medicare PPO | Source: Ambulatory Visit | Attending: Pulmonary Disease | Admitting: Pulmonary Disease

## 2019-09-07 DIAGNOSIS — Z20828 Contact with and (suspected) exposure to other viral communicable diseases: Secondary | ICD-10-CM | POA: Diagnosis not present

## 2019-09-07 DIAGNOSIS — Z01812 Encounter for preprocedural laboratory examination: Secondary | ICD-10-CM | POA: Insufficient documentation

## 2019-09-09 ENCOUNTER — Other Ambulatory Visit: Payer: Self-pay

## 2019-09-09 ENCOUNTER — Encounter: Payer: Self-pay | Admitting: Family Medicine

## 2019-09-09 ENCOUNTER — Ambulatory Visit (INDEPENDENT_AMBULATORY_CARE_PROVIDER_SITE_OTHER): Payer: Medicare PPO | Admitting: Family Medicine

## 2019-09-09 VITALS — BP 150/60 | HR 100 | Temp 97.1°F | Resp 16 | Ht 61.0 in | Wt 238.8 lb

## 2019-09-09 DIAGNOSIS — Z794 Long term (current) use of insulin: Secondary | ICD-10-CM

## 2019-09-09 DIAGNOSIS — E1140 Type 2 diabetes mellitus with diabetic neuropathy, unspecified: Secondary | ICD-10-CM | POA: Diagnosis not present

## 2019-09-09 DIAGNOSIS — N1832 Chronic kidney disease, stage 3b: Secondary | ICD-10-CM

## 2019-09-09 DIAGNOSIS — I10 Essential (primary) hypertension: Secondary | ICD-10-CM | POA: Diagnosis not present

## 2019-09-09 DIAGNOSIS — L304 Erythema intertrigo: Secondary | ICD-10-CM | POA: Diagnosis not present

## 2019-09-09 LAB — BASIC METABOLIC PANEL
BUN: 31 mg/dL — ABNORMAL HIGH (ref 6–23)
CO2: 30 mEq/L (ref 19–32)
Calcium: 9.8 mg/dL (ref 8.4–10.5)
Chloride: 100 mEq/L (ref 96–112)
Creatinine, Ser: 1.56 mg/dL — ABNORMAL HIGH (ref 0.40–1.20)
GFR: 39.05 mL/min — ABNORMAL LOW (ref >60.00)
Glucose, Bld: 250 mg/dL — ABNORMAL HIGH (ref 70–99)
Potassium: 4.6 mEq/L (ref 3.5–5.1)
Sodium: 139 mEq/L (ref 135–145)

## 2019-09-09 LAB — NOVEL CORONAVIRUS, NAA (HOSP ORDER, SEND-OUT TO REF LAB; TAT 18-24 HRS): SARS-CoV-2, NAA: NOT DETECTED

## 2019-09-09 LAB — HEMOGLOBIN A1C: Hgb A1c MFr Bld: 7.8 % — ABNORMAL HIGH (ref 4.6–6.5)

## 2019-09-09 MED ORDER — CLOTRIMAZOLE-BETAMETHASONE 1-0.05 % EX CREA: 1.0000 "application " | TOPICAL_CREAM | Freq: Two times a day (BID) | CUTANEOUS | 1 refills | Status: DC | PRN

## 2019-09-09 NOTE — Assessment & Plan Note (Signed)
BP elevated today. Rechecked and unchanged. We discussed possible complications of elevated BP. For now no changes in current management given the fact home BP readings have been adequate. Recommend continuing monitoring BP at home and to let me know about BP readings in 2 weeks. Continue low-salt diet. Instructed about warning signs.

## 2019-09-09 NOTE — Patient Instructions (Addendum)
A few things to remember from today's visit:   Type 2 diabetes mellitus with diabetic neuropathy, with long-term current use of insulin ([place]) - Plan: Basic Metabolic Panel, Hemoglobin A1c, Fructosamine  Essential hypertension  Stage 3b chronic kidney disease  Intertrigo - Plan: clotrimazole-betamethasone (LOTRISONE) cream  Your blood pressure here in the office elevated. Because you are reporting "good" blood pressures at home, no changes in medications. Monitor your blood pressure at home and your pulse daily, please let me know about BP numbers in about 2 weeks.  Please be sure medication list is accurate. If a new problem present, please set up appointment sooner than planned today.

## 2019-09-09 NOTE — Assessment & Plan Note (Addendum)
HgA1C not at goal. No changes in current management, treatment will be adjusted according to A1c results. Regular exercise and healthy diet with avoidance of added sugar food intake is an important part of treatment and recommended. Annual eye exam, periodic dental and foot care recommended.  She can use the blue solution to calibrate glucometer at home,she does not have it with her today. Glucose checked today with her glucometer and ours.  F/U in 4 months

## 2019-09-09 NOTE — Assessment & Plan Note (Signed)
BP and glucose need to be adequately controlled. Continue low-salt diet. Avoid NSAIDs. Adequate hydration. ACE inhibitor on her allergy list.

## 2019-09-09 NOTE — Assessment & Plan Note (Signed)
We discussed benefits of wt loss as well as adverse effects of obesity. Consistency with healthy diet and physical activity recommended. Daily brisk walking for 15-30 min as tolerated.

## 2019-09-09 NOTE — Progress Notes (Signed)
HPI:  Chief Complaint  Patient presents with  . Follow-up    Regina [name] is a 78 y.o. female, who is here today for chronic disease management. Last seen on 05/21/19. No new problem since her last OV.  HTN on Terazosin 5 mg daily and Adarbyclor 40-25 mg daily. Negative for  headache, visual changes, chest pain, dyspnea, palpitation,focal weakness, or edema.  CKD III,she has not noted gross hematuria,foam in urine,or decreased urine output.  Lab Results  Component Value Date   CREATININE 1.63 (H) 05/22/2019   BUN 33 (H) 05/22/2019   NA 135 05/22/2019   K 4.1 05/22/2019   CL 98 05/22/2019   CO2 30 05/22/2019   HTpEF. Denies orthopnea and PND. She is on Furosemide 20 mg tid. Echo 08/2017: LVEF 60%, LVH.  DM II: FG "pretty good" 120's max. She is not checking postprandial glucose. She is on Ozempic 2 mg weekly,Toujeo 30 U daily,and Trajenta 5 mg daily. Denies abdominal pain, nausea,vomiting, polydipsia,polyuria, or polyphagia.  She is not sure if her glucometer is accurate.  Last eye exam 2 months ago.  Lab Results  Component Value Date   HGBA1C 7.7 (H) 05/22/2019   Lab Results  Component Value Date   MICROALBUR 1.9 05/22/2019    She is not exercising regularly nor following a healthful diet consistently. She is candy frequently.   Today she is complaining about pruritic rash on the left breast, she has had problem intermittently for a while. She has not used OTC medications. Rash is not tender. She has not identified exacerbating or alleviating factors.   Review of Systems  Constitutional: Negative for activity change, appetite change, fatigue and fever.  HENT: Negative for mouth sores, nosebleeds and trouble swallowing.   Eyes: Negative for redness and visual disturbance.  Respiratory: Negative for cough and wheezing.   Gastrointestinal: Negative for changes in bowel habits.  Genitourinary: Negative for decreased urine volume, difficulty  urinating, dysuria and hematuria.  Musculoskeletal: Negative for gait problem and myalgias.  Skin: Negative for color change and wounds.    Neurological: Negative for syncope, weakness and facial asymmetry.  Psychiatric/Behavioral: Negative for confusion,depression,and anxiety. Rest of ROS see pertinent positives and negatives in HPI.  Current Outpatient Medications on File Prior to Visit  Medication Sig Dispense Refill  . Alcohol Swabs (B-D SINGLE USE SWABS REGULAR) PADS Use to test blood sugar up to 3 times daily 100 each 3  . allopurinol (ZYLOPRIM) 100 MG tablet Take 100 mg by mouth daily.    . Ascorbic Acid (VITA-C PO) Take by mouth daily.    Marland Kitchen aspirin 81 MG tablet Take 81 mg by mouth daily.    Marland Kitchen atorvastatin (LIPITOR) 20 MG tablet Take 20 mg by mouth daily.    . Azilsartan-Chlorthalidone 40-25 MG TABS Take 1 tablet by mouth daily.    . Blood Glucose Monitoring Suppl (ACCU-CHEK GUIDE) w/Device KIT 1 Device by Does not apply route 3 (three) times daily. 1 kit 0  . cyclobenzaprine (FLEXERIL) 5 MG tablet Take 1 tablet (5 mg total) by mouth 2 (two) times daily. 30 tablet 3  . donepezil (ARICEPT) 5 MG tablet Take 5 mg by mouth daily.    . furosemide (LASIX) 20 MG tablet Take 20 mg by mouth 3 (three) times a week.    . gabapentin (NEURONTIN) 600 MG tablet Take 600 mg by mouth 2 (two) times daily.    Marland Kitchen glucose blood (ACCU-CHEK GUIDE) test strip Use to test blood sugar  up to 3 times daily 100 each 3  . Insulin Glargine, 2 Unit Dial, 300 UNIT/ML SOPN Inject 30 Units into the skin daily.    Marland Kitchen lactulose (CHRONULAC) 10 GM/15ML solution Take 20 g by mouth daily.    Marland Kitchen lactulose (CHRONULAC) 10 GM/15ML solution Take 15 mLs by mouth daily as needed for constipation.    . Lancets Misc. (ACCU-CHEK FASTCLIX LANCET) KIT Use to test blood sugar up to 3 times daily 1 kit 1  . levothyroxine (SYNTHROID) 150 MCG tablet Take 150 mcg by mouth daily.    Marland Kitchen linagliptin (TRADJENTA) 5 MG TABS tablet Take 5 mg by mouth  daily.    Marland Kitchen loratadine (CLARITIN) 10 MG tablet Take 10 mg by mouth daily as needed.    . magnesium oxide (MAG-OX) 400 (241.3 Mg) MG tablet Take 1 tablet by mouth daily.    . meclizine (ANTIVERT) 25 MG tablet Take 25 mg by mouth daily as needed.    . Omega-3 Fatty Acids (FISH OIL) 1000 MG CAPS Take by mouth daily.    Marland Kitchen omeprazole (PRILOSEC) 20 MG capsule Take 20 mg by mouth daily.    Marland Kitchen OZEMPIC, 1 MG/DOSE, 2 MG/1.5ML SOPN Inject 2 mg into the skin once a week.    . terazosin (HYTRIN) 5 MG capsule Take 5 mg by mouth daily.    Marland Kitchen VITAMIN D PO Take by mouth daily.     No current facility-administered medications on file prior to visit.     Past Medical History:  Diagnosis Date  . Diabetes mellitus without complication ([place])   . H/O echocardiogram    a. 01/2015 Echo: EF 55-60%, Gr 2 DD, mod LVH, mildly dil LA.  Marland Kitchen Hypertension   . Non-obstructive CAD    a. 01/2015 Cardiolite: + inf wall ischemia, EF 56%;  b. 01/2015 Cath: LM nl, LAD 53m LCX min irregs, RCA dominant, 50-675m . Sleep apnea   . Thyroid disease     Allergies  Allergen Reactions  . Ace Inhibitors   . Amlodipine   . Atenolol     bradycardia  . Avandia [Rosiglitazone]   . Darvon [Propoxyphene]   . Erythromycin Itching  . Hydralazine     Burning in throat and chest  . Hydrocodone     No reaction per patient, but ineffective  . Levofloxacin   . Morphine And Related Other (See Comments)    Dizzy and hallucianation, vomiting; Willing to try low dose  . Percocet [Oxycodone-Acetaminophen]     hallucination  . Spironolactone   . Tramadol     Unknown/does not recall reaction but does not want to take again    Social History   Socioeconomic History  . Marital status: Widowed    Spouse name: Not on file  . Number of children: Not on file  . Years of education: Not on file  . Highest education level: Not on file  Occupational History  . Not on file  Social Needs  . Financial resource strain: Not on file  . Food  insecurity    Worry: Not on file    Inability: Not on file  . Transportation needs    Medical: Not on file    Non-medical: Not on file  Tobacco Use  . Smoking status: Never Smoker  . Smokeless tobacco: Never Used  Substance and Sexual Activity  . Alcohol use: No  . Drug use: Not on file  . Sexual activity: Not on file  Lifestyle  . Physical activity  Days per week: Not on file    Minutes per session: Not on file  . Stress: Not on file  Relationships  . Social Herbalist on phone: Not on file    Gets together: Not on file    Attends religious service: Not on file    Active member of club or organization: Not on file    Attends meetings of clubs or organizations: Not on file    Relationship status: Not on file  Other Topics Concern  . Not on file  Social History Narrative  . Not on file    Today's Vitals   09/09/19 0820 09/09/19 0904  BP: (!) 150/60   Pulse: (!) 113 100  Resp: 16   Temp: (!) 97.1 F (36.2 C)   TempSrc: Temporal   SpO2: 99%   Weight: 238 lb 12.8 oz (108.3 kg)   Height: _0  (1.549 m)     Body mass index is 45.12 kg/m.   Physical Exam  Nursing note and vitals reviewed. Constitutional: She is oriented to person, place, and time. She appears well-developed. No distress.  HENT:  Head: Normocephalic and atraumatic.  Mouth/Throat: Oropharynx is clear and moist and mucous membranes are normal.  Eyes: Pupils are equal, round, and reactive to light. Conjunctivae are normal.  Cardiovascular: Normal rate and regular rhythm.  No murmur heard. Pulses:      Dorsalis pedis pulses are 2+ on the right side, and 2+ on the left side.  Respiratory: Effort normal and breath sounds normal. No respiratory distress.  GI: Soft. She exhibits no mass. There is no hepatomegaly. There is no tenderness.  Musculoskeletal: She exhibits no edema.  Lymphadenopathy:    She has no cervical adenopathy.  Neurological: She is alert and oriented to person, place,  and time. She has normal strength. No cranial nerve deficit. Gait normal.  Skin: Skin is warm. Under left breast hyperpigmented ,liquenified area. No erythema.  Psychiatric: She has a normal mood and affect.  Well groomed, good eye contact.    Diabetic Foot Exam - Simple   Simple Foot Form Diabetic Foot exam was performed with the following findings: Yes 09/09/2019  8:14 PM  Visual Inspection See comments: Yes Sensation Testing Intact to touch and monofilament testing bilaterally: Yes Pulse Check Posterior Tibialis and Dorsalis pulse intact bilaterally: Yes Comments Forefoot callus, bilateral.       ASSESSMENT AND PLAN:  Regina [name] was seen today for follow-up.  Diagnoses and all orders for this visit:  Orders Placed This Encounter  Procedures  . Basic Metabolic Panel  . Hemoglobin A1c  . Fructosamine    Lab Results  Component Value Date   HGBA1C 7.8 (H) 09/09/2019   Lab Results  Component Value Date   CREATININE 1.56 (H) 09/09/2019   BUN 31 (H) 09/09/2019   NA 139 09/09/2019   K 4.6 09/09/2019   CL 100 09/09/2019   CO2 30 09/09/2019    Intertrigo Educated about Dx,prognosis,and treatment options. Tyr to keep are dry and clean with soap and water. Monitor for signs of bacterial infection. Side effects of chronic topical steroid use discussed. F/U as needed.  -     clotrimazole-betamethasone (LOTRISONE) cream; Apply 1 application topically 2 (two) times daily as needed.  Obesity, Class III, BMI 40-49.9 (morbid obesity) We discussed benefits of wt loss as well as adverse effects of obesity. Consistency with healthy diet and physical activity recommended. Daily brisk walking for 15-30 min as tolerated.  CKD (chronic kidney disease), stage III ([facility]) BP and glucose need to be adequately controlled. Continue low-salt diet. Avoid NSAIDs. Adequate hydration. ACE inhibitor on her allergy list.   Type 2 diabetes mellitus with diabetic neuropathy,  unspecified ([place]) HgA1C not at goal. No changes in current management, treatment will be adjusted according to A1c results. Regular exercise and healthy diet with avoidance of added sugar food intake is an important part of treatment and recommended. Annual eye exam, periodic dental and foot care recommended.  She can use the blue solution to calibrate glucometer at home,she does not have it with her today. Glucose checked today with her glucometer and ours.  F/U in 4 months   Essential hypertension BP elevated today. Rechecked and unchanged. We discussed possible complications of elevated BP. For now no changes in current management given the fact home BP readings have been adequate. Recommend continuing monitoring BP at home and to let me know about BP readings in 2 weeks. Continue low-salt diet. Instructed about warning signs.   Return in about 4 months (around 01/07/2020).    [name], MD [facility]. [place] office.

## 2019-09-10 ENCOUNTER — Ambulatory Visit (INDEPENDENT_AMBULATORY_CARE_PROVIDER_SITE_OTHER): Payer: Medicare PPO | Admitting: Pulmonary Disease

## 2019-09-10 ENCOUNTER — Encounter: Payer: Self-pay | Admitting: Pulmonary Disease

## 2019-09-10 ENCOUNTER — Encounter: Payer: Self-pay | Admitting: Family Medicine

## 2019-09-10 VITALS — BP 140/82 | HR 80 | Temp 98.1°F | Ht 62.0 in | Wt 236.0 lb

## 2019-09-10 DIAGNOSIS — Z9989 Dependence on other enabling machines and devices: Secondary | ICD-10-CM | POA: Diagnosis not present

## 2019-09-10 DIAGNOSIS — R0600 Dyspnea, unspecified: Secondary | ICD-10-CM

## 2019-09-10 DIAGNOSIS — R911 Solitary pulmonary nodule: Secondary | ICD-10-CM | POA: Diagnosis not present

## 2019-09-10 DIAGNOSIS — G4733 Obstructive sleep apnea (adult) (pediatric): Secondary | ICD-10-CM | POA: Diagnosis not present

## 2019-09-10 DIAGNOSIS — R0609 Other forms of dyspnea: Secondary | ICD-10-CM

## 2019-09-10 LAB — PULMONARY FUNCTION TEST
DL/VA % pred: 87 %
DL/VA: 3.63 ml/min/mmHg/L
DLCO unc % pred: 70 %
DLCO unc: 12.65 ml/min/mmHg
FEF 25-75 Post: 2.03 L/sec
FEF 25-75 Pre: 1.72 L/sec
FEF2575-%Change-Post: 17 %
FEF2575-%Pred-Post: 148 %
FEF2575-%Pred-Pre: 126 %
FEV1-%Change-Post: 1 %
FEV1-%Pred-Post: 116 %
FEV1-%Pred-Pre: 114 %
FEV1-Post: 1.78 L
FEV1-Pre: 1.75 L
FEV1FVC-%Change-Post: 7 %
FEV1FVC-%Pred-Pre: 107 %
FEV6-%Change-Post: -5 %
FEV6-%Pred-Post: 106 %
FEV6-%Pred-Pre: 112 %
FEV6-Post: 2.02 L
FEV6-Pre: 2.13 L
FEV6FVC-%Change-Post: 0 %
FEV6FVC-%Pred-Post: 104 %
FEV6FVC-%Pred-Pre: 104 %
FVC-%Change-Post: -5 %
FVC-%Pred-Post: 101 %
FVC-%Pred-Pre: 107 %
FVC-Post: 2.02 L
FVC-Pre: 2.14 L
Post FEV1/FVC ratio: 88 %
Post FEV6/FVC ratio: 100 %
Pre FEV1/FVC ratio: 82 %
Pre FEV6/FVC Ratio: 100 %
RV % pred: 72 %
RV: 1.58 L
TLC % pred: 78 %
TLC: 3.75 L

## 2019-09-10 NOTE — Progress Notes (Signed)
PFT done today. 

## 2019-09-10 NOTE — Progress Notes (Signed)
Subjective:     Patient ID: Regina [name], female   DOB: 1941-04-21, 78 y.o.   MRN: [id number]  Patient with a history of obstructive sleep apnea History of lung nodule  No significant changes to her health  Has some shortness of breath with activity  Patient is compliant with CPAP use She is unsure the severity of her sleep disordered breathing  Continues to use CPAP on a regular basis Has been on CPAP for about 2 years now  She was told about a lung nodule about 2 years ago She is had no follow-up for this nodule  Shortness of breath on moderate exertion Shortness of breath when she is walking on level ground as well  Never smoker No pertinent occupational history  Past Medical History:  Diagnosis Date  . Diabetes mellitus without complication ([place])   . H/O echocardiogram    a. 01/2015 Echo: EF 55-60%, Gr 2 DD, mod LVH, mildly dil LA.  Marland Kitchen Hypertension   . Non-obstructive CAD    a. 01/2015 Cardiolite: + inf wall ischemia, EF 56%;  b. 01/2015 Cath: LM nl, LAD 33m, LCX min irregs, RCA dominant, 50-50m.  . Sleep apnea   . Thyroid disease    Social History   Socioeconomic History  . Marital status: Widowed    Spouse name: Not on file  . Number of children: Not on file  . Years of education: Not on file  . Highest education level: Not on file  Occupational History  . Not on file  Social Needs  . Financial resource strain: Not on file  . Food insecurity    Worry: Not on file    Inability: Not on file  . Transportation needs    Medical: Not on file    Non-medical: Not on file  Tobacco Use  . Smoking status: Never Smoker  . Smokeless tobacco: Never Used  Substance and Sexual Activity  . Alcohol use: No  . Drug use: Not on file  . Sexual activity: Not on file  Lifestyle  . Physical activity    Days per week: Not on file    Minutes per session: Not on file  . Stress: Not on file  Relationships  . Social Herbalist on phone: Not on file    Gets  together: Not on file    Attends religious service: Not on file    Active member of club or organization: Not on file    Attends meetings of clubs or organizations: Not on file    Relationship status: Not on file  . Intimate partner violence    Fear of current or ex partner: Not on file    Emotionally abused: Not on file    Physically abused: Not on file    Forced sexual activity: Not on file  Other Topics Concern  . Not on file  Social History Narrative  . Not on file   History reviewed. No pertinent family history.   Review of Systems  Constitutional: Negative for fever and unexpected weight change.  HENT: Negative for congestion, dental problem, ear pain, nosebleeds, postnasal drip, rhinorrhea, sinus pressure, sneezing, sore throat and trouble swallowing.   Eyes: Negative for redness and itching.  Respiratory: Positive for shortness of breath. Negative for cough, chest tightness and wheezing.   Cardiovascular: Negative for palpitations and leg swelling.  Gastrointestinal: Negative for nausea and vomiting.  Genitourinary: Negative for dysuria.  Musculoskeletal: Negative for joint swelling.  Skin: Negative for rash.  Allergic/Immunologic: Negative.  Negative for environmental allergies, food allergies and immunocompromised state.  Neurological: Negative for headaches.  Hematological: Does not bruise/bleed easily.  Psychiatric/Behavioral: Negative for dysphoric mood. The patient is not nervous/anxious.        Objective:   Physical Exam Constitutional:      Appearance: She is obese.  HENT:     Head: Normocephalic and atraumatic.     Nose: Nose normal.     Mouth/Throat:     Mouth: Mucous membranes are dry.  Eyes:     General:        Right eye: No discharge.        Left eye: No discharge.     Pupils: Pupils are equal, round, and reactive to light.  Neck:     Musculoskeletal: Normal range of motion and neck supple. No neck rigidity or muscular tenderness.   Cardiovascular:     Rate and Rhythm: Normal rate and regular rhythm.     Pulses: Normal pulses.     Heart sounds: No murmur.  Pulmonary:     Effort: Pulmonary effort is normal. No respiratory distress.     Breath sounds: Normal breath sounds. No stridor. No wheezing or rhonchi.  Abdominal:     General: There is no distension.     Palpations: There is no mass.     Tenderness: There is no abdominal tenderness.  Neurological:     Mental Status: She is alert.    Vitals:   09/10/19 1159  BP: 140/82  Pulse: 80  Temp: 98.1 F (36.7 C)  SpO2: 99%   CPAP compliance data reveals 100% compliance, residual AHI of 1  CT scan with multiple nodules, stable from 2018  PFT shows no obstruction, no restriction    Assessment:     Obstructive sleep apnea -Compliant with treatment -Appliance data shows excellent treatment  -Continue CPAP therapy   History of lung nodule that needed follow-up -CAD with calcified nodules -Small nodules -Low risk -Repeat in a year is appropriate  Shortness of breath on exertion -Graded exercises will help -No significant issues other PFT and echocardiogram to limit activity     Plan:     Encouraged to start graded exercises  Continue CPAP use on a regular basis  Risks and sequelae of untreated sleep disordered breathing discussed  Encourage weight loss efforts  I will see her back in the office in about 6 months  Encouraged to call with any significant concerns

## 2019-09-10 NOTE — Patient Instructions (Signed)
I will see her back in the office in about 6 months  Continue with graded exercises  We will repeat your CT scan in 1 year  Your breathing study looks fine as we discussed Your sleep apnea treatment also looks fine as we reviewed

## 2019-09-14 LAB — FRUCTOSAMINE: Fructosamine: 325 umol/L — ABNORMAL HIGH (ref 205–285)

## 2019-09-20 ENCOUNTER — Ambulatory Visit: Payer: Medicare PPO | Admitting: Family Medicine

## 2019-09-23 ENCOUNTER — Ambulatory Visit: Payer: Medicare PPO | Admitting: Family Medicine

## 2019-10-03 ENCOUNTER — Telehealth: Payer: Self-pay | Admitting: *Deleted

## 2019-10-03 NOTE — Telephone Encounter (Signed)
Returned call to patient and she stated that she has been having leg cramps or spasms for a couple of months now. Patient wanted to know if there was anything that Dr. [name] would suggest that she do. Patient offered virtual visit with a provider that was available today, pt. declined, wants to wait until PCP return on Friday to make recommendations.   Copied from [place] [phone]. Topic: General - Other >> Oct 03, 2019 10:59 AM Rainey Pines A wrote: Patient would like to speak with Dr. Morrison Old nurse in regards to cramps she has in her legs and ankles. Please advise

## 2019-10-04 NOTE — Telephone Encounter (Signed)
Please advise 

## 2019-10-07 NOTE — Telephone Encounter (Signed)
Muscle cramps are difficult to treat unless we know specific etiology, which most of the time we do not. Electrolyte imbalance like low K+, last time it was checked was in normal range (09/09/2019)  Dehydration can cause cramps, so be sure to stay well-hydrated. She can use topical IcyHot or Aspercreme. Stretching exercises. OTC magnesium may also help.  Please arrange follow-up appointment if problem is persistent or gets worse. Thanks, BJ

## 2019-10-08 NOTE — Telephone Encounter (Signed)
I called and spoke with pt. We went over the information below. She verbalized understanding & will let us know if the problem gets any worse.

## 2019-10-17 ENCOUNTER — Other Ambulatory Visit: Payer: Self-pay | Admitting: Family Medicine

## 2019-10-17 NOTE — Telephone Encounter (Signed)
Copied from [place] [id number]. Topic: Quick Communication - Rx Refill/Question >> Oct 17, 2019 11:20 AM Mcneil, Ja-Kwan wrote: Medication: gabapentin (NEURONTIN) 600 MG tablet, loratadine (CLARITIN) 10 MG tablet, and magnesium oxide (MAG-OX) 400 (241.3 Mg) MG tablet  Has the patient contacted their pharmacy? yes   Preferred Pharmacy (with phone number or street name): [facility] [place] Phone: [phone] Fax: [phone]  Agent: Please be advised that RX refills may take up to 3 business days. We ask that you follow-up with your pharmacy.

## 2019-10-17 NOTE — Telephone Encounter (Signed)
Requested medication (s) are due for refill today: yes  Requested medication (s) are on the active medication list: yes  Last refill:  05/21/2019  Future visit scheduled: no  Notes to clinic:  medication last filled by historical provider  Review for refill   Requested Prescriptions  Pending Prescriptions Disp Refills   gabapentin (NEURONTIN) 600 MG tablet       Sig: Take 1 tablet (600 mg total) by mouth 2 (two) times daily.      Neurology: Anticonvulsants - gabapentin Passed - 10/17/2019 11:33 AM      Passed - Valid encounter within last 12 months    Recent Outpatient Visits           1 month ago Type 2 diabetes mellitus with diabetic neuropathy, with long-term current use of insulin ([place])   [place] [name], Malka So, MD   4 months ago Essential hypertension   [name], Malka So, MD   5 months ago Right-sided thoracic back pain, unspecified chronicity   [place] [facility], Alinda Sierras, MD                magnesium oxide (MAG-OX) 400 (241.3 Mg) MG tablet       Sig: Take 1 tablet (400 mg total) by mouth daily.      Endocrinology:  Minerals - Magnesium Supplementation Failed - 10/17/2019 11:33 AM      Failed - Mg Level in normal range and within 360 days    No results found for: MG        Passed - Valid encounter within last 12 months    Recent Outpatient Visits           1 month ago Type 2 diabetes mellitus with diabetic neuropathy, with long-term current use of insulin ([place])   [name], Malka So, MD   4 months ago Essential hypertension   [name], Malka So, MD   5 months ago Right-sided thoracic back pain, unspecified chronicity   [facility], Alinda Sierras, MD                loratadine (CLARITIN) 10 MG tablet       Sig: Take 1 tablet (10 mg total) by mouth daily as needed.      Ear, Nose, and  Throat:  Antihistamines Passed - 10/17/2019 11:33 AM      Passed - Valid encounter within last 12 months    Recent Outpatient Visits           1 month ago Type 2 diabetes mellitus with diabetic neuropathy, with long-term current use of insulin ([place])   [place] [name], Malka So, MD   4 months ago Essential hypertension   [name], Malka So, MD   5 months ago Right-sided thoracic back pain, unspecified chronicity   [facility], Alinda Sierras, MD

## 2019-10-23 MED ORDER — GABAPENTIN 600 MG PO TABS: 600.0000 mg | ORAL_TABLET | Freq: Two times a day (BID) | ORAL | 2 refills | Status: DC

## 2019-10-23 MED ORDER — MAGNESIUM OXIDE 400 (241.3 MG) MG PO TABS: 1.0000 | ORAL_TABLET | Freq: Every day | ORAL | 0 refills | Status: DC

## 2019-10-23 MED ORDER — LORATADINE 10 MG PO TABS: 10.0000 mg | ORAL_TABLET | Freq: Every day | ORAL | 0 refills | Status: DC | PRN

## 2019-10-24 ENCOUNTER — Telehealth: Payer: Self-pay | Admitting: Family Medicine

## 2019-10-24 NOTE — Telephone Encounter (Signed)
Medication Refill - Medication:  OZEMPIC, 1 MG/DOSE, 2 MG/1.5ML SOPN   Has the patient contacted their pharmacy? Yes advised to call office. Please call pt when done.   Preferred Pharmacy (with phone number or street name):  [facility] [place] - [place] Phone:  [phone]  Fax:  [phone]     Agent: Please be advised that RX refills may take up to 3 business days. We ask that you follow-up with your pharmacy.

## 2019-10-24 NOTE — Telephone Encounter (Signed)
Requested medication (s) are due for refill today: yes  Requested medication (s) are on the active medication list: yes  Future visit scheduled:no  Notes to clinic: review for refill Last filled by historical provider    Requested Prescriptions  Pending Prescriptions Disp Refills   OZEMPIC, 1 MG/DOSE, 2 MG/1.5ML SOPN       Sig: Inject 2 mg into the skin once a week.      Endocrinology:  Diabetes - GLP-1 Receptor Agonists Passed - 10/24/2019  9:00 AM      Passed - HBA1C is between 0 and 7.9 and within 180 days    Hgb A1c MFr Bld  Date Value Ref Range Status  09/09/2019 7.8 (H) 4.6 - 6.5 % Final    Comment:    Glycemic Control Guidelines for People with Diabetes:Non Diabetic:  <6%Goal of Therapy: <7%Additional Action Suggested:  >8%           Passed - Valid encounter within last 6 months    Recent Outpatient Visits           1 month ago Type 2 diabetes mellitus with diabetic neuropathy, with long-term current use of insulin ([place])   [name], Malka So, MD   5 months ago Essential hypertension   [name], Malka So, MD   5 months ago Right-sided thoracic back pain, unspecified chronicity   [facility], Alinda Sierras, MD

## 2019-10-28 MED ORDER — OZEMPIC (1 MG/DOSE) 2 MG/1.5ML ~~LOC~~ SOPN: 1.0000 mg | PEN_INJECTOR | SUBCUTANEOUS | 2 refills | Status: DC

## 2019-10-31 NOTE — Telephone Encounter (Signed)
Patient is having questions regarding her medication that was called in for her.  Patient is wondering when she needs to start medication and how she needs to take it with other medications. Call back [phone]

## 2019-10-31 NOTE — Telephone Encounter (Signed)
Message Routed to PCP CMA 

## 2019-11-01 MED ORDER — TOUJEO SOLOSTAR 300 UNIT/ML ~~LOC~~ SOPN: 35.0000 [IU] | PEN_INJECTOR | Freq: Every day | SUBCUTANEOUS | 3 refills | Status: DC

## 2019-11-01 NOTE — Telephone Encounter (Signed)
I called and spoke with pt. She was needing a refill on her Toujeo. Rx sent in to pt's pharmacy. She is aware to take Ozempic once weekly.

## 2019-11-01 NOTE — Telephone Encounter (Signed)
According to records she is on Ozempic 2 mg weekly but max dose is 1 mg per week; so I corrected dose thinking it was a mistake. Thanks, BJ

## 2019-11-01 NOTE — Addendum Note (Signed)
Addended by: Nathanial Millman E on: 11/01/2019 12:16 PM   Modules accepted: Orders

## 2019-11-04 ENCOUNTER — Telehealth: Payer: Self-pay | Admitting: *Deleted

## 2019-11-04 NOTE — Telephone Encounter (Signed)
Copied from [place] [id number]. Topic: General - Other >> Nov 04, 2019 11:40 AM Rainey Pines A wrote: Patient would like a callback from nurse in regards to if she should get the covid vaccine on 11/06/2019 considering all medication she takes and conditions. Please advise

## 2019-11-04 NOTE — Telephone Encounter (Signed)
Please advise 

## 2019-11-06 ENCOUNTER — Telehealth: Payer: Self-pay

## 2019-11-06 NOTE — Telephone Encounter (Signed)
Pharmacy requesting refills on Donepezil 5 mg tablet, I don't see where you filled this before?

## 2019-11-07 NOTE — Telephone Encounter (Signed)
I do not see contraindication for COVID 19 vaccination. Several risk factors for complications, benefit is greater than the risk; so I recommend vaccination. Thanks, BJ

## 2019-11-07 NOTE — Telephone Encounter (Signed)
I have not prescribed and I do not see a clear indication when reviewing problem list. It seems like it was prescribed last in 04/2018. We can discuss this next f/u appt or we can see her sooner to discuss this problem.  Thanks, BJ

## 2019-11-08 NOTE — Telephone Encounter (Signed)
I called and spoke with pt. She got her vaccine on Wednesday, pt is doing fine, her arm is a little sore. Pt will be in office for her appointment on 1/26.

## 2019-11-08 NOTE — Telephone Encounter (Signed)
Noted, pt is scheduled to be seen on 1/26.

## 2019-11-11 ENCOUNTER — Other Ambulatory Visit: Payer: Self-pay

## 2019-11-12 ENCOUNTER — Ambulatory Visit (INDEPENDENT_AMBULATORY_CARE_PROVIDER_SITE_OTHER): Payer: Medicare Other | Admitting: Family Medicine

## 2019-11-12 ENCOUNTER — Encounter: Payer: Self-pay | Admitting: Family Medicine

## 2019-11-12 VITALS — BP 126/70 | HR 103 | Temp 96.1°F | Resp 16 | Ht 62.0 in | Wt 235.0 lb

## 2019-11-12 DIAGNOSIS — I10 Essential (primary) hypertension: Secondary | ICD-10-CM | POA: Diagnosis not present

## 2019-11-12 DIAGNOSIS — E113499 Type 2 diabetes mellitus with severe nonproliferative diabetic retinopathy without macular edema, unspecified eye: Secondary | ICD-10-CM | POA: Diagnosis not present

## 2019-11-12 DIAGNOSIS — K219 Gastro-esophageal reflux disease without esophagitis: Secondary | ICD-10-CM

## 2019-11-12 DIAGNOSIS — M25551 Pain in right hip: Secondary | ICD-10-CM

## 2019-11-12 DIAGNOSIS — M545 Low back pain, unspecified: Secondary | ICD-10-CM

## 2019-11-12 DIAGNOSIS — R200 Anesthesia of skin: Secondary | ICD-10-CM

## 2019-11-12 DIAGNOSIS — G8929 Other chronic pain: Secondary | ICD-10-CM

## 2019-11-12 LAB — POCT GLYCOSYLATED HEMOGLOBIN (HGB A1C): Hemoglobin A1C: 6.9 % — AB (ref 4.0–5.6)

## 2019-11-12 MED ORDER — TIZANIDINE HCL 4 MG PO TABS: 2.0000 mg | ORAL_TABLET | Freq: Two times a day (BID) | ORAL | 0 refills | Status: DC | PRN

## 2019-11-12 MED ORDER — ATORVASTATIN CALCIUM 20 MG PO TABS: 20.0000 mg | ORAL_TABLET | Freq: Every day | ORAL | 2 refills | Status: DC

## 2019-11-12 MED ORDER — PREDNISONE 20 MG PO TABS: ORAL_TABLET | ORAL | 0 refills | Status: AC

## 2019-11-12 MED ORDER — OMEPRAZOLE 40 MG PO CPDR: 40.0000 mg | DELAYED_RELEASE_CAPSULE | Freq: Every day | ORAL | 3 refills | Status: DC

## 2019-11-12 NOTE — Patient Instructions (Addendum)
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 Toujeo 5 more units daily for 2-3 weeks because Prednisone can increase blood sugars.  Please be sure medication list is accurate. If a new problem present, please set up appointment sooner than planned today.

## 2019-11-12 NOTE — Progress Notes (Signed)
ACUTE VISIT   HPI:  Chief Complaint  Patient presents with  . Spasms  . Follow-up    Regina [name] is a 79 y.o. female, who is here today complaining of 3 weeks of right groin pain radiated to right side of abdomen and back. Numbness RLE down to calf. "Really bad" sharp pain, exacerbated by movement and alleviated by rest.  No prior hx but has hx of back pain, right-side. Negative for fever,chills,abdominal pain,N/V,or skin rash. Denies saddle anesthesia or bowel/bladder dysfunction. No urinary symptoms. No constipation but states that she has to strain. Taking Tylenol. Pain is better today.  Hx of peripheral neuropathy, she is on Gabapentin 600 mg bid.  Lumbar MRI in 2015: 1. Congenitally short pedicles. 2. Moderate multifactorial spinal stenosis at L3-4 secondary to annular disc bulging, facet disease, a grade 1 anterolisthesis and synovial cysts in the ligamentum flavum. 3. Mild multifactorial spinal stenosis at L2-3 and L4-5.  DM II: She is on Trajenta 5 mg daily, Toujeo 35 U daily,and Ozempic 1 mg weekly. Negative for polydipsia,polyuria, or polyphagia.  Lab Results  Component Value Date   HGBA1C 7.8 (H) 09/09/2019   BS's low 100's.  HTN: She is on Azisartan-chlorthalidone 40-25 mg daily and Terazosin 5 mg daily.  Lab Results  Component Value Date   CREATININE 1.56 (H) 09/09/2019   BUN 31 (H) 09/09/2019   NA 139 09/09/2019   K 4.6 09/09/2019   CL 100 09/09/2019   CO2 30 09/09/2019   Denies severe/frequent headache, visual changes, chest pain, dyspnea, palpitation, focal weakness, or edema.  She is not exercising regularly and has not been consistent with following a healthful diet.  GERD: Requesting "something" to help with heartburn. Mid upper chest and throat burning sensation. Worse in the morning when she wakes up. She has been on Omeprazole 20 mg, has not taken it lately.  Requesting refills on Aricept 5 mg , which she is not sure  about indication. She states that she forgets something sometimes but she can remember after a few minutes.  Review of Systems  Constitutional: Negative for activity change, appetite change and fatigue.  HENT: Negative for mouth sores, nosebleeds and trouble swallowing.   Respiratory: Negative for cough and wheezing.   Gastrointestinal: Negative for abdominal pain and blood in stool.       Negative for changes in bowel habits.  Genitourinary: Negative for decreased urine volume, dysuria and hematuria.  Musculoskeletal: Negative for gait problem.  Neurological: Negative for syncope and facial asymmetry.  Rest see pertinent positives and negatives per HPI.  Current Outpatient Medications on File Prior to Visit  Medication Sig Dispense Refill  . Alcohol Swabs (B-D SINGLE USE SWABS REGULAR) PADS Use to test blood sugar up to 3 times daily 100 each 3  . allopurinol (ZYLOPRIM) 100 MG tablet Take 100 mg by mouth daily.    . Ascorbic Acid (VITA-C PO) Take by mouth daily.    Marland Kitchen aspirin 81 MG tablet Take 81 mg by mouth daily.    . Azilsartan-Chlorthalidone 40-25 MG TABS Take 1 tablet by mouth daily.    . Blood Glucose Monitoring Suppl (ACCU-CHEK GUIDE) w/Device KIT 1 Device by Does not apply route 3 (three) times daily. 1 kit 0  . clotrimazole-betamethasone (LOTRISONE) cream Apply 1 application topically 2 (two) times daily as needed. 30 g 1  . cyclobenzaprine (FLEXERIL) 5 MG tablet Take 1 tablet (5 mg total) by mouth 2 (two) times daily. 30 tablet 3  .  furosemide (LASIX) 20 MG tablet Take 20 mg by mouth 3 (three) times a week.    . gabapentin (NEURONTIN) 600 MG tablet Take 1 tablet (600 mg total) by mouth 2 (two) times daily. 60 tablet 2  . glucose blood (ACCU-CHEK GUIDE) test strip Use to test blood sugar up to 3 times daily 100 each 3  . Insulin Glargine, 1 Unit Dial, (TOUJEO SOLOSTAR) 300 UNIT/ML SOPN Inject 35 Units into the skin daily. 9 mL 3  . lactulose (CHRONULAC) 10 GM/15ML solution Take  20 g by mouth daily.    Marland Kitchen lactulose (CHRONULAC) 10 GM/15ML solution Take 15 mLs by mouth daily as needed for constipation.    . Lancets Misc. (ACCU-CHEK FASTCLIX LANCET) KIT Use to test blood sugar up to 3 times daily 1 kit 1  . levothyroxine (SYNTHROID) 150 MCG tablet Take 150 mcg by mouth daily.    Marland Kitchen linagliptin (TRADJENTA) 5 MG TABS tablet Take 5 mg by mouth daily.    Marland Kitchen loratadine (CLARITIN) 10 MG tablet Take 1 tablet (10 mg total) by mouth daily as needed. 90 tablet 0  . magnesium oxide (MAG-OX) 400 (241.3 Mg) MG tablet Take 1 tablet (400 mg total) by mouth daily. 90 tablet 0  . meclizine (ANTIVERT) 25 MG tablet Take 25 mg by mouth daily as needed.    . Omega-3 Fatty Acids (FISH OIL) 1000 MG CAPS Take by mouth daily.    Marland Kitchen OZEMPIC, 1 MG/DOSE, 2 MG/1.5ML SOPN Inject 1 mg into the skin once a week. 3 mL 2  . terazosin (HYTRIN) 5 MG capsule Take 5 mg by mouth daily.    Marland Kitchen VITAMIN D PO Take by mouth daily.     No current facility-administered medications on file prior to visit.    Past Medical History:  Diagnosis Date  . Diabetes mellitus without complication ([place])   . H/O echocardiogram    a. 01/2015 Echo: EF 55-60%, Gr 2 DD, mod LVH, mildly dil LA.  Marland Kitchen Hypertension   . Non-obstructive CAD    a. 01/2015 Cardiolite: + inf wall ischemia, EF 56%;  b. 01/2015 Cath: LM nl, LAD 73m LCX min irregs, RCA dominant, 50-64m . Sleep apnea   . Thyroid disease    Allergies  Allergen Reactions  . Ace Inhibitors   . Amlodipine   . Atenolol     bradycardia  . Avandia [Rosiglitazone]   . Darvon [Propoxyphene]   . Erythromycin Itching  . Hydralazine     Burning in throat and chest  . Hydrocodone     No reaction per patient, but ineffective  . Levofloxacin   . Morphine And Related Other (See Comments)    Dizzy and hallucianation, vomiting; Willing to try low dose  . Percocet [Oxycodone-Acetaminophen]     hallucination  . Spironolactone   . Tramadol     Unknown/does not recall reaction but  does not want to take again    Social History   Socioeconomic History  . Marital status: Widowed    Spouse name: Not on file  . Number of children: Not on file  . Years of education: Not on file  . Highest education level: Not on file  Occupational History  . Not on file  Tobacco Use  . Smoking status: Never Smoker  . Smokeless tobacco: Never Used  Substance and Sexual Activity  . Alcohol use: No  . Drug use: Not on file  . Sexual activity: Not on file  Other Topics Concern  .  Not on file  Social History Narrative  . Not on file   Social Determinants of Health   Financial Resource Strain:   . Difficulty of Paying Living Expenses: Not on file  Food Insecurity:   . Worried About Charity fundraiser in the Last Year: Not on file  . Ran Out of Food in the Last Year: Not on file  Transportation Needs:   . Lack of Transportation (Medical): Not on file  . Lack of Transportation (Non-Medical): Not on file  Physical Activity:   . Days of Exercise per Week: Not on file  . Minutes of Exercise per Session: Not on file  Stress:   . Feeling of Stress : Not on file  Social Connections:   . Frequency of Communication with Friends and Family: Not on file  . Frequency of Social Gatherings with Friends and Family: Not on file  . Attends Religious Services: Not on file  . Active Member of Clubs or Organizations: Not on file  . Attends Archivist Meetings: Not on file  . Marital Status: Not on file    Vitals:   11/12/19 1135  BP: 126/70  Pulse: (!) 103  Resp: 16  Temp: (!) 96.1 F (35.6 C)  SpO2: 94%   Wt Readings from Last 3 Encounters:  11/12/19 235 lb (106.6 kg)  09/10/19 236 lb (107 kg)  09/09/19 238 lb 12.8 oz (108.3 kg)   Body mass index is 42.98 kg/m.  Physical Exam  Nursing note and vitals reviewed. Constitutional: She is oriented to person, place, and time. She appears well-developed. No distress.  HENT:  Head: Normocephalic and atraumatic.  Eyes:  Pupils are equal, round, and reactive to light. Conjunctivae are normal.  Cardiovascular: Normal rate and regular rhythm.  No murmur heard. Pulses:      Dorsalis pedis pulses are 2+ on the right side and 2+ on the left side.  HR by my count: 88/min  Respiratory: Effort normal and breath sounds normal. No respiratory distress.  GI: Soft. She exhibits no mass. There is no hepatomegaly. There is no abdominal tenderness.  Musculoskeletal:        General: Edema (Trace pitting LE edema,bilateral.) present.     Lumbar back: No tenderness or bony tenderness.     Right hip: No tenderness or bony tenderness. Decreased range of motion.     Comments: Limitation of internal and external rotation, right hip. Knee crepitus bilateral.  Lymphadenopathy:    She has no cervical adenopathy.  Neurological: She is alert and oriented to person, place, and time. She has normal strength. No cranial nerve deficit.  SLR negative bilateral. Right side pulling sensation posterior aspect of LE.  Skin: Skin is warm. No rash noted. No erythema.  Psychiatric: She has a normal mood and affect.  Well groomed, good eye contact.   ASSESSMENT AND PLAN:  Ms. Rawan was seen today for spasms and follow-up.  Diagnoses and all orders for this visit:  Lab Results  Component Value Date   HGBA1C 6.9 (A) 11/12/2019    Right hip pain Possible causes discussed. OA and radicular pain to be considered. I do nit think imaging is needed today. Wt loss will help.  -     predniSONE (DELTASONE) 20 MG tablet; 3 tabs for 3 days, 2 tabs for 3 days, 1 tabs for 3 days, and 1/2 tab for 3 days. Take tables together with breakfast.  Lower extremity numbness She has hx of peripheral neuropathy but reporting  this problem as new, so prednisone taper may help. Side effects of Prednisone discussed, instructed to take med with food. If not better lumbar MRI will be considered.  -     predniSONE (DELTASONE) 20 MG tablet; 3 tabs for 3 days,  2 tabs for 3 days, 1 tabs for 3 days, and 1/2 tab for 3 days. Take tables together with breakfast.  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 Because she is going to take Prednisone for a few days, recommend increasing Toujeo from 35 U to 40 U for 3 weeks then she can go back to 35 U. No changes in rest of her medications.  Regular exercise and healthy diet with avoidance of added sugar food intake is an important part of treatment and recommended. Annual eye exam, periodic dental and foot care recommended. F/U in 5-6 months  -     POC HgB A1c  Chronic right-sided low back pain, unspecified whether sciatica present She took Flexeril before. She agrees with trying Zanaflex. Some side effects discussed. Fall precautions.  -     tiZANidine (ZANAFLEX) 4 MG tablet; Take 0.5-1 tablets (2-4 mg total) by mouth every 12 (twelve) hours as needed for muscle spasms.  Essential hypertension BP adequately controlled. No changes in current management. Continue low salt diet.  Gastroesophageal reflux disease, unspecified whether esophagitis present GERD precautions. Omeprazole 40 mg x 6-8 weeks then we will try decreasing dose to 20 mg.  -     omeprazole (PRILOSEC) 40 MG capsule; Take 1 capsule (40 mg total) by mouth daily.  Obesity, Class III, BMI 40-49.9 (morbid obesity) ([place]) We discussed benefits of wt loss as well as adverse effects of obesity. Consistency with healthy diet and physical activity recommended.    Return in about 4 months (around 03/11/2020).   Cloa Bushong G. [name], MD  [facility]. [place] office.

## 2019-11-13 ENCOUNTER — Other Ambulatory Visit: Payer: Self-pay

## 2019-11-13 MED ORDER — ATORVASTATIN CALCIUM 20 MG PO TABS: 20.0000 mg | ORAL_TABLET | Freq: Every day | ORAL | 2 refills | Status: DC

## 2019-11-14 ENCOUNTER — Encounter: Payer: Self-pay | Admitting: Family Medicine

## 2019-12-25 ENCOUNTER — Telehealth: Payer: Self-pay | Admitting: Family Medicine

## 2019-12-25 MED ORDER — CYCLOBENZAPRINE HCL 5 MG PO TABS: 5.0000 mg | ORAL_TABLET | Freq: Two times a day (BID) | ORAL | 3 refills | Status: DC

## 2019-12-25 NOTE — Telephone Encounter (Signed)
Rx refilled as requested.  

## 2019-12-25 NOTE — Telephone Encounter (Signed)
cyclobenzaprine (FLEXERIL) 5 MG tablet [place] ([place]), [place] [facility] [place] Phone:  [phone]  Fax:  [phone]

## 2020-01-14 ENCOUNTER — Other Ambulatory Visit: Payer: Self-pay | Admitting: Family Medicine

## 2020-01-14 ENCOUNTER — Telehealth: Payer: Self-pay | Admitting: Family Medicine

## 2020-01-14 MED ORDER — TERAZOSIN HCL 5 MG PO CAPS: 5.0000 mg | ORAL_CAPSULE | Freq: Every day | ORAL | 1 refills | Status: DC

## 2020-01-14 NOTE — Telephone Encounter (Signed)
Okay to refill. Last rx'd in 2014 by historical provider

## 2020-01-14 NOTE — Telephone Encounter (Signed)
According to one of my notes she reported taking medication for hypertension. Prescription was sent. Thanks, BJ

## 2020-01-14 NOTE — Telephone Encounter (Signed)
Medication Refill: Terazosin  Pharmacy: [facility]: [phone]

## 2020-01-22 ENCOUNTER — Other Ambulatory Visit: Payer: Self-pay | Admitting: Family Medicine

## 2020-01-27 ENCOUNTER — Telehealth: Payer: Self-pay | Admitting: Pulmonary Disease

## 2020-01-28 NOTE — Telephone Encounter (Signed)
Error

## 2020-01-31 ENCOUNTER — Other Ambulatory Visit: Payer: Self-pay | Admitting: *Deleted

## 2020-01-31 MED ORDER — ALLOPURINOL 100 MG PO TABS: 100.0000 mg | ORAL_TABLET | Freq: Every day | ORAL | 3 refills | Status: DC

## 2020-02-15 DIAGNOSIS — Z03818 Encounter for observation for suspected exposure to other biological agents ruled out: Secondary | ICD-10-CM | POA: Diagnosis not present

## 2020-02-17 ENCOUNTER — Ambulatory Visit (INDEPENDENT_AMBULATORY_CARE_PROVIDER_SITE_OTHER)
Admission: RE | Admit: 2020-02-17 | Discharge: 2020-02-17 | Disposition: A | Discharge status: Home / Self Care | Payer: Medicare Other | Source: Ambulatory Visit | Attending: Pulmonary Disease | Admitting: Pulmonary Disease

## 2020-02-17 ENCOUNTER — Other Ambulatory Visit: Payer: Self-pay

## 2020-02-17 DIAGNOSIS — R918 Other nonspecific abnormal finding of lung field: Secondary | ICD-10-CM | POA: Diagnosis not present

## 2020-02-17 DIAGNOSIS — R911 Solitary pulmonary nodule: Secondary | ICD-10-CM

## 2020-02-18 ENCOUNTER — Ambulatory Visit: Payer: Medicare Other | Admitting: Pulmonary Disease

## 2020-02-18 ENCOUNTER — Encounter: Payer: Self-pay | Admitting: Pulmonary Disease

## 2020-02-18 VITALS — BP 114/56 | HR 86 | Temp 97.8°F | Ht 62.0 in | Wt 234.6 lb

## 2020-02-18 DIAGNOSIS — R911 Solitary pulmonary nodule: Secondary | ICD-10-CM | POA: Diagnosis not present

## 2020-02-18 NOTE — Patient Instructions (Signed)
Obstructive sleep apnea -Continue using CPAP on a regular basis -Adjust your mask as needed  Lung nodules -All the nodules are stable -Repeat CT in 18 months  I will follow-up with you in 6 months Call with any significant concerns

## 2020-02-18 NOTE — Progress Notes (Signed)
Subjective:     Patient ID: Regina [name], female   DOB: 1941/03/03, 79 y.o.   MRN: [id number]  Patient with a history of obstructive sleep apnea History of lung nodules  Has been compliant with CPAP use on a regular basis Feels it helps  CT scan on 02/17/2020 shows stable lung nodules  She does have some leak -We talked about adjusting the mask as possible  Shortness of breath with activity -Did talk about graded exercises -She is considering going back to the gym -She did receive all vaccines  Never smoker No pertinent occupational history  Past Medical History:  Diagnosis Date  . Diabetes mellitus without complication ([place])   . H/O echocardiogram    a. 01/2015 Echo: EF 55-60%, Gr 2 DD, mod LVH, mildly dil LA.  Marland Kitchen Hypertension   . Non-obstructive CAD    a. 01/2015 Cardiolite: + inf wall ischemia, EF 56%;  b. 01/2015 Cath: LM nl, LAD 46m, LCX min irregs, RCA dominant, 50-32m.  . Sleep apnea   . Thyroid disease    Social History   Socioeconomic History  . Marital status: Widowed    Spouse name: Not on file  . Number of children: Not on file  . Years of education: Not on file  . Highest education level: Not on file  Occupational History  . Not on file  Tobacco Use  . Smoking status: Never Smoker  . Smokeless tobacco: Never Used  Substance and Sexual Activity  . Alcohol use: No  . Drug use: Not on file  . Sexual activity: Not on file  Other Topics Concern  . Not on file  Social History Narrative  . Not on file   Social Determinants of Health   Financial Resource Strain:   . Difficulty of Paying Living Expenses:   Food Insecurity:   . Worried About Charity fundraiser in the Last Year:   . Arboriculturist in the Last Year:   Transportation Needs:   . Film/video editor (Medical):   Marland Kitchen Lack of Transportation (Non-Medical):   Physical Activity:   . Days of Exercise per Week:   . Minutes of Exercise per Session:   Stress:   . Feeling of Stress :    Social Connections:   . Frequency of Communication with Friends and Family:   . Frequency of Social Gatherings with Friends and Family:   . Attends Religious Services:   . Active Member of Clubs or Organizations:   . Attends Archivist Meetings:   Marland Kitchen Marital Status:   Intimate Partner Violence:   . Fear of Current or Ex-Partner:   . Emotionally Abused:   Marland Kitchen Physically Abused:   . Sexually Abused:    History reviewed. No pertinent family history.   Review of Systems  Constitutional: Negative for fever and unexpected weight change.  HENT: Negative for congestion, dental problem, ear pain, nosebleeds, postnasal drip, rhinorrhea, sinus pressure, sneezing, sore throat and trouble swallowing.   Eyes: Negative for redness and itching.  Respiratory: Positive for shortness of breath. Negative for cough, chest tightness and wheezing.   Cardiovascular: Negative for palpitations and leg swelling.  Gastrointestinal: Negative for nausea and vomiting.  Genitourinary: Negative for dysuria.  Musculoskeletal: Negative for joint swelling.  Skin: Negative for rash.  Allergic/Immunologic: Negative.  Negative for environmental allergies, food allergies and immunocompromised state.  Neurological: Negative for headaches.  Hematological: Does not bruise/bleed easily.  Psychiatric/Behavioral: Negative for dysphoric mood. The patient is not  nervous/anxious.        Objective:   Physical Exam Constitutional:      Appearance: She is obese.  HENT:     Head: Normocephalic and atraumatic.     Nose: Nose normal.  Cardiovascular:     Rate and Rhythm: Normal rate and regular rhythm.     Pulses: Normal pulses.     Heart sounds: No murmur.  Pulmonary:     Effort: Pulmonary effort is normal. No respiratory distress.     Breath sounds: Normal breath sounds. No stridor. No wheezing or rhonchi.  Musculoskeletal:     Cervical back: Normal range of motion and neck supple. No rigidity. No muscular  tenderness.  Neurological:     Mental Status: She is alert.    Vitals:   02/18/20 0948  BP: (!) 114/56  Pulse: 86  Temp: 97.8 F (36.6 C)  SpO2: 98%   CPAP compliance data reveals 97% compliance, average use of 7 hours 57 minutes Set between 4 and 20 Residual AHI 1.3 Some significant leaks-discussed  CT scan reviewed showing stable nodules  PFT shows no obstruction, no restriction    Assessment:     Obstructive sleep apnea -Compliant with treatment -Excellent compliance and efficiency -Encouraged to continue CPAP on a regular basis   History of lung nodule that needed follow-up -CAD with calcified nodules -Lung nodules are stable  Shortness of breath on exertion -Graded exercises will help -No significant issues other PFT and echocardiogram to limit activity     Plan:     Continue graded exercises  Continue CPAP use on a regular basis  Risks and sequelae of untreated sleep disordered breathing discussed  Weight loss efforts encouraged I will see her in 6 months  Repeat CT scan of the chest in 18 months

## 2020-02-26 ENCOUNTER — Telehealth: Payer: Self-pay | Admitting: Family Medicine

## 2020-02-26 ENCOUNTER — Other Ambulatory Visit: Payer: Self-pay | Admitting: *Deleted

## 2020-02-26 DIAGNOSIS — E1140 Type 2 diabetes mellitus with diabetic neuropathy, unspecified: Secondary | ICD-10-CM

## 2020-02-26 DIAGNOSIS — Z794 Long term (current) use of insulin: Secondary | ICD-10-CM

## 2020-02-26 NOTE — Telephone Encounter (Signed)
Pt would like to know if she needs a referral to the weight and wellness center? Thanks

## 2020-02-26 NOTE — Telephone Encounter (Signed)
It is okay to place referral as requested. Thanks, BJ

## 2020-02-26 NOTE — Telephone Encounter (Signed)
Referral placed as requested.

## 2020-02-26 NOTE — Telephone Encounter (Signed)
Spoke with patient and she stated that she would like a referral to the weight management clinic. Is it okay to place referral?

## 2020-02-28 ENCOUNTER — Telehealth: Payer: Self-pay | Admitting: Family Medicine

## 2020-02-28 DIAGNOSIS — I25119 Atherosclerotic heart disease of native coronary artery with unspecified angina pectoris: Secondary | ICD-10-CM

## 2020-02-28 NOTE — Telephone Encounter (Signed)
Pt would like referral to Cardiology on [address] Claiborne [place]. Pt was seeing a cardiologist in the past in [place] for heart disease but since she have moved with family member she need to have a doctor closer to her home here in [place].

## 2020-03-02 NOTE — Telephone Encounter (Signed)
Please advise 

## 2020-03-02 NOTE — Telephone Encounter (Signed)
It is okay to place referral as requested. Thanks, BJ

## 2020-03-03 NOTE — Telephone Encounter (Signed)
Patient notified of update  and verbalized understanding. 

## 2020-03-05 ENCOUNTER — Other Ambulatory Visit: Payer: Self-pay | Admitting: Family Medicine

## 2020-03-09 NOTE — Progress Notes (Signed)
Cardiology Office Note:   Date:  03/12/2020  NAME:  Regina [name]    MRN: [id number] DOB:  11-08-1940   PCP:  [name], [name], MD  Cardiologist:  No primary care provider on file.   Referring MD: [name], [name], MD   Chief Complaint  Patient presents with  . Coronary Artery Disease   History of Present Illness:   Regina [name] is a 79 y.o. female with a hx of CAD, HTN, HLD, DM who is being seen today for the evaluation of CAD at the request of [name], [name], MD. Has non-obstructive CAD diagnosed in [place] in 2016.  She reports for the last 2 months has had increasingly worsening shortness of breath with heavy exertion.  She reports activities such as climbing the stairs or going for long walks to get her quite out of breath.  She reports she has symptoms daily.  She is not gaining weight.  She did have a recent CT scan of her chest which demonstrated likely pulmonary edema.  Her CVD risk factors include hypertension, morbid obesity as well as diabetes.  Her most recent A1c shows 6.9 her diabetes under good control.  She reports she is trying to lose weight but is having trouble.  Her BMI is 43.  She does have sleep apnea but uses her machine.  She reports her lower extremities have not increase in swelling.  She reports that bending forward does get her short of breath as well.  She describes no chest pain with exertion.  She is a never smoker.  She reports that she needs work on her diet.  She does consume salty and fatty meals.  She denies chest pain or palpitations.  She reports an occasional fluttering in her chest but it appears to be positional and improved with laying on her right side.  Overall this is not concerning for arrhythmia.  Problem List 1. DM -A1c 7.8 2. HLD -T chol 98, HDL 44, LDL 30, TG 119 3. HTN 4. Non-obstructive CAD -LHC 01/19/2015 [facility] [place] -50% mid RCA and 50% LAD 5. OSA  Past Medical History: Past Medical History:  Diagnosis Date  . Diabetes mellitus  without complication ([place])   . H/O echocardiogram    a. 01/2015 Echo: EF 55-60%, Gr 2 DD, mod LVH, mildly dil LA.  Marland Kitchen Hypertension   . Non-obstructive CAD    a. 01/2015 [facility]: + inf wall ischemia, EF 56%;  b. 01/2015 Cath: LM nl, LAD 61m LCX min irregs, RCA dominant, 50-651m . Sleep apnea   . Thyroid disease     Past Surgical History: Past Surgical History:  Procedure Laterality Date  . ABDOMINAL HYSTERECTOMY    . CARDIAC CATHETERIZATION    . ESOPHAGOGASTRODUODENOSCOPY     a. 01/2015 EGD: patent esophagus.  . ESOPHAGOGASTRODUODENOSCOPY N/A 01/20/2015   Procedure: ESOPHAGOGASTRODUODENOSCOPY (EGD);  Surgeon: PaCarol AdaMD;  [facility];  Service: Endoscopy;  Laterality: N/A;  . JOINT REPLACEMENT     reports history bilateral TKA and right TSA  . LEFT HEART CATHETERIZATION WITH CORONARY ANGIOGRAM N/A 01/19/2015  . THYROIDECTOMY      Current Medications: Current Meds  Medication Sig  . Alcohol Swabs (B-D SINGLE USE SWABS REGULAR) PADS Use to test blood sugar up to 3 times daily  . allopurinol (ZYLOPRIM) 100 MG tablet Take 1 tablet (100 mg total) by mouth daily.  . Ascorbic Acid (VITA-C PO) Take by mouth daily.  . Marland Kitchenspirin 81 MG tablet Take  81 mg by mouth daily.  Marland Kitchen atorvastatin (LIPITOR) 20 MG tablet Take 1 tablet (20 mg total) by mouth daily.  . Azilsartan-Chlorthalidone 40-25 MG TABS Take 1 tablet by mouth daily.  . Blood Glucose Monitoring Suppl (ACCU-CHEK GUIDE) w/Device KIT 1 Device by Does not apply route 3 (three) times daily.  . clotrimazole-betamethasone (LOTRISONE) cream Apply 1 application topically 2 (two) times daily as needed.  . cyclobenzaprine (FLEXERIL) 5 MG tablet Take 1 tablet by mouth twice daily  . furosemide (LASIX) 20 MG tablet Take 1 tablet (20 mg total) by mouth daily.  Marland Kitchen gabapentin (NEURONTIN) 600 MG tablet Take 1 tablet (600 mg total) by mouth 2 (two) times daily.  Marland Kitchen glucose blood (ACCU-CHEK GUIDE) test strip Use to test blood sugar up to 3  times daily  . Insulin Glargine, 1 Unit Dial, (TOUJEO SOLOSTAR) 300 UNIT/ML SOPN Inject 35 Units into the skin daily.  Marland Kitchen lactulose (CHRONULAC) 10 GM/15ML solution Take 20 g by mouth daily.  Marland Kitchen lactulose (CHRONULAC) 10 GM/15ML solution Take 15 mLs by mouth daily as needed for constipation.  . Lancets Misc. (ACCU-CHEK FASTCLIX LANCET) KIT Use to test blood sugar up to 3 times daily  . levothyroxine (SYNTHROID) 150 MCG tablet Take 150 mcg by mouth daily.  Marland Kitchen linagliptin (TRADJENTA) 5 MG TABS tablet Take 5 mg by mouth daily.  Marland Kitchen loratadine (CLARITIN) 10 MG tablet Take 1 tablet (10 mg total) by mouth daily as needed.  . magnesium oxide (MAG-OX) 400 MG tablet Take 1 tablet by mouth once daily  . meclizine (ANTIVERT) 25 MG tablet Take 25 mg by mouth daily as needed.  . Omega-3 Fatty Acids (FISH OIL) 1000 MG CAPS Take by mouth daily.  Marland Kitchen omeprazole (PRILOSEC) 40 MG capsule Take 1 capsule (40 mg total) by mouth daily.  Marland Kitchen OZEMPIC, 1 MG/DOSE, 2 MG/1.5ML SOPN Inject 1 mg into the skin once a week.  . terazosin (HYTRIN) 5 MG capsule Take 1 capsule (5 mg total) by mouth daily.  Marland Kitchen tiZANidine (ZANAFLEX) 4 MG tablet Take 0.5-1 tablets (2-4 mg total) by mouth every 12 (twelve) hours as needed for muscle spasms.  Marland Kitchen VITAMIN D PO Take by mouth daily.  . [DISCONTINUED] furosemide (LASIX) 20 MG tablet Take 20 mg by mouth 3 (three) times a week.     Allergies:    Ace inhibitors, Amlodipine, Atenolol, Avandia [rosiglitazone], Darvon [propoxyphene], Erythromycin, Hydralazine, Hydrocodone, Levofloxacin, Morphine and related, Percocet [oxycodone-acetaminophen], Spironolactone, and Tramadol   Social History: Social History   Socioeconomic History  . Marital status: Widowed    Spouse name: Not on file  . Number of children: Not on file  . Years of education: Not on file  . Highest education level: Not on file  Occupational History  . Not on file  Tobacco Use  . Smoking status: Never Smoker  . Smokeless tobacco:  Never Used  Substance and Sexual Activity  . Alcohol use: No  . Drug use: Not on file  . Sexual activity: Not on file  Other Topics Concern  . Not on file  Social History Narrative  . Not on file   Social Determinants of Health   Financial Resource Strain:   . Difficulty of Paying Living Expenses:   Food Insecurity:   . Worried About Charity fundraiser in the Last Year:   . Arboriculturist in the Last Year:   Transportation Needs:   . Film/video editor (Medical):   Marland Kitchen Lack of Transportation (Non-Medical):  Physical Activity:   . Days of Exercise per Week:   . Minutes of Exercise per Session:   Stress:   . Feeling of Stress :   Social Connections:   . Frequency of Communication with Friends and Family:   . Frequency of Social Gatherings with Friends and Family:   . Attends Religious Services:   . Active Member of Clubs or Organizations:   . Attends Archivist Meetings:   Marland Kitchen Marital Status:      Family History: The patient's family history includes Heart disease in her mother.  ROS:   All other ROS reviewed and negative. Pertinent positives noted in the HPI.     EKGs/Labs/Other Studies Reviewed:   The following studies were personally reviewed by me today:  EKG:  EKG is ordered today.  The ekg ordered today demonstrates sinus tachycardia, heart rate 107, no acute ST-T changes, no evidence of prior infarction, and was personally reviewed by me.   Recent Labs: 05/22/2019: ALT 12; Hemoglobin 11.0; Platelets 295.0; TSH 2.73 09/09/2019: BUN 31; Creatinine, Ser 1.56; Potassium 4.6; Sodium 139   Recent Lipid Panel    Component Value Date/Time   CHOL 98 05/22/2019 1631   TRIG 119.0 05/22/2019 1631   HDL 44.70 05/22/2019 1631   CHOLHDL 2 05/22/2019 1631   VLDL 23.8 05/22/2019 1631   LDLCALC 30 05/22/2019 1631    Physical Exam:   VS:  BP (!) 154/72   Pulse (!) 102   Ht _0  (1.575 m)   Wt 236 lb 3.2 oz (107.1 kg)   SpO2 99%   BMI 43.20 kg/m    Wt  Readings from Last 3 Encounters:  03/12/20 236 lb 3.2 oz (107.1 kg)  02/18/20 234 lb 9.6 oz (106.4 kg)  11/12/19 235 lb (106.6 kg)    General: Well nourished, well developed, in no acute distress Heart: Atraumatic, normal size  Eyes: PEERLA, EOMI  Neck: Supple, no JVD Endocrine: No thryomegaly Cardiac: Normal S1, S2; RRR; no murmurs, rubs, or gallops Lungs: Clear to auscultation bilaterally, no wheezing, rhonchi or rales  Abd: Soft, nontender, no hepatomegaly  Ext: Trace edema Musculoskeletal: No deformities, BUE and BLE strength normal and equal Skin: Warm and dry, no rashes   Neuro: Alert and oriented to person, place, time, and situation, CNII-XII grossly intact, no focal deficits  Psych: Normal mood and affect   ASSESSMENT:   Regina [name] is a 79 y.o. female who presents for the following: 1. Coronary artery disease involving native coronary artery of native heart without angina pectoris   2. SOB (shortness of breath)   3. Mixed hyperlipidemia   4. Essential hypertension   5. Obesity, morbid, BMI 40.0-49.9 ([place])     PLAN:   1. Coronary artery disease involving native coronary artery of native heart without angina pectoris -Nonobstructive CAD on left heart cath in 2016 in [facility].  No symptoms of angina.  We will continue aspirin and statin therapy.  Her most recent LDL cholesterol is 30.  This is at goal.  Triglycerides 119.  2. SOB (shortness of breath) -I suspect she has heart failure with preserved ejection fraction.  She did have a CT scan of her chest which showed likely pulmonary edema.  I have asked her to increase her Lasix to 20 mg daily.  She was taking it every other day.  We will obtain a BNP, TSH, BMP today.  I also would like for her to get an echocardiogram.  We  will evaluate her diastolic function.  Her EKG demonstrates sinus tachycardia but she reports she is nervous.  There is no evidence of prior infarction or acute ischemic changes.  I have a  low suspicion for underlying ischemia.  If we find HFpEF we likely will pursue a stress test but for now we will hold on this.  She needs to work on diet and exercise.  I advised her extensively that if she does have heart failure with preserved ejection fraction the mainstay of treatment will be control of risk factors which includes diabetes, hypertension and obesity.  3. Mixed hyperlipidemia -Continue statin.  Most recent LDL 30.  4. Essential hypertension -Blood pressure elevated today.  She reports he was nervous and anxious to get here.  She did rush to get her.  We will further titrate medications at her next visit.  5. Obesity, morbid, BMI 40.0-49.9 ([place]) -Counseled on importance of weight loss and exercise   Disposition: Return in about 1 month (around 04/12/2020).  Medication Adjustments/Labs and Tests Ordered: Current medicines are reviewed at length with the patient today.  Concerns regarding medicines are outlined above.  Orders Placed This Encounter  Procedures  . TSH  . Brain natriuretic peptide  . Basic metabolic panel  . EKG 12-Lead  . ECHOCARDIOGRAM COMPLETE   Meds ordered this encounter  Medications  . furosemide (LASIX) 20 MG tablet    Sig: Take 1 tablet (20 mg total) by mouth daily.    Dispense:  90 tablet    Refill:  1    Patient Instructions  Medication Instructions:  Start Lasix 20 mg daily   *If you need a refill on your cardiac medications before your next appointment, please call your pharmacy*   Lab Work: TSH, BMET, BNP  If you have labs (blood work) drawn today and your tests are completely normal, you will receive your results only by: Marland Kitchen MyChart Message (if you have MyChart) OR . A paper copy in the mail If you have any lab test that is abnormal or we need to change your treatment, we will call you to review the results.   Testing/Procedures: Echocardiogram - Your physician has requested that you have an echocardiogram. Echocardiography is  a painless test that uses sound waves to create images of your heart. It provides your doctor with information about the size and shape of your heart and how well your heart's chambers and valves are working. This procedure takes approximately one hour. There are no restrictions for this procedure. This will be performed at our [facility] location - [address], [address].    Follow-Up: At [facility], you and your health needs are our priority.  As part of our continuing mission to provide you with exceptional heart care, we have created designated Provider Care Teams.  These Care Teams include your primary Cardiologist (physician) and Advanced Practice Providers (APPs -  Physician Assistants and Nurse Practitioners) who all work together to provide you with the care you need, when you need it.  We recommend signing up for the patient portal called "MyChart".  Sign up information is provided on this After Visit Summary.  MyChart is used to connect with patients for Virtual Visits (Telemedicine).  Patients are able to view lab/test results, encounter notes, upcoming appointments, etc.  Non-urgent messages can be sent to your provider as well.   To learn more about what you can do with MyChart, go to [email or web address].    Your next appointment:  1 month(s)  The format for your next appointment:   In Person  Provider:   [name], MD      Signed, Addison Naegeli. Audie Box, [place]  [address] [place], [place] [address] [phone]  03/12/2020 2:16 PM

## 2020-03-12 ENCOUNTER — Telehealth: Payer: Self-pay | Admitting: Family Medicine

## 2020-03-12 ENCOUNTER — Ambulatory Visit: Payer: Medicare Other | Admitting: Cardiovascular Disease

## 2020-03-12 ENCOUNTER — Other Ambulatory Visit: Payer: Self-pay

## 2020-03-12 ENCOUNTER — Encounter: Payer: Self-pay | Admitting: Cardiovascular Disease

## 2020-03-12 VITALS — BP 154/72 | HR 102 | Ht 62.0 in | Wt 236.2 lb

## 2020-03-12 DIAGNOSIS — I10 Essential (primary) hypertension: Secondary | ICD-10-CM | POA: Diagnosis not present

## 2020-03-12 DIAGNOSIS — R0602 Shortness of breath: Secondary | ICD-10-CM

## 2020-03-12 DIAGNOSIS — I2510 Atherosclerotic heart disease of native coronary artery without angina pectoris: Secondary | ICD-10-CM

## 2020-03-12 DIAGNOSIS — E782 Mixed hyperlipidemia: Secondary | ICD-10-CM

## 2020-03-12 MED ORDER — FUROSEMIDE 20 MG PO TABS: 20.0000 mg | ORAL_TABLET | Freq: Every day | ORAL | 1 refills | Status: DC

## 2020-03-12 NOTE — Patient Instructions (Signed)
Medication Instructions:  Start Lasix 20 mg daily   *If you need a refill on your cardiac medications before your next appointment, please call your pharmacy*   Lab Work: TSH, BMET, BNP  If you have labs (blood work) drawn today and your tests are completely normal, you will receive your results only by: Marland Kitchen MyChart Message (if you have MyChart) OR . A paper copy in the mail If you have any lab test that is abnormal or we need to change your treatment, we will call you to review the results.   Testing/Procedures: Echocardiogram - Your physician has requested that you have an echocardiogram. Echocardiography is a painless test that uses sound waves to create images of your heart. It provides your doctor with information about the size and shape of your heart and how well your heart's chambers and valves are working. This procedure takes approximately one hour. There are no restrictions for this procedure. This will be performed at our [facility] location - [address].    Follow-Up: At [facility], you and your health needs are our priority.  As part of our continuing mission to provide you with exceptional heart care, we have created designated Provider Care Teams.  These Care Teams include your primary Cardiologist (physician) and Advanced Practice Providers (APPs -  Physician Assistants and Nurse Practitioners) who all work together to provide you with the care you need, when you need it.  We recommend signing up for the patient portal called "MyChart".  Sign up information is provided on this After Visit Summary.  MyChart is used to connect with patients for Virtual Visits (Telemedicine).  Patients are able to view lab/test results, encounter notes, upcoming appointments, etc.  Non-urgent messages can be sent to your provider as well.   To learn more about what you can do with MyChart, go to [email or web address].    Your next appointment:   1 month(s)  The format  for your next appointment:   In Person  Provider:   [name], MD

## 2020-03-12 NOTE — Telephone Encounter (Signed)
Pt. Would like a refill for test strips and lancets. Send to [facility] on [address] in [place] Pt states she is completely out

## 2020-03-13 ENCOUNTER — Other Ambulatory Visit: Payer: Self-pay | Admitting: Family Medicine

## 2020-03-13 ENCOUNTER — Other Ambulatory Visit: Payer: Self-pay | Admitting: *Deleted

## 2020-03-13 LAB — BASIC METABOLIC PANEL
BUN/Creatinine Ratio: 18 (ref 12–28)
BUN: 31 mg/dL — ABNORMAL HIGH (ref 8–27)
CO2: 23 mmol/L (ref 20–29)
Calcium: 9.6 mg/dL (ref 8.7–10.3)
Chloride: 101 mmol/L (ref 96–106)
Creatinine, Ser: 1.73 mg/dL — ABNORMAL HIGH (ref 0.57–1.00)
GFR calc Af Amer: 33 mL/min/{1.73_m2} — ABNORMAL LOW (ref >59)
GFR calc non Af Amer: 28 mL/min/{1.73_m2} — ABNORMAL LOW (ref >59)
Glucose: 140 mg/dL — ABNORMAL HIGH (ref 65–99)
Potassium: 4.3 mmol/L (ref 3.5–5.2)
Sodium: 140 mmol/L (ref 134–144)

## 2020-03-13 LAB — BRAIN NATRIURETIC PEPTIDE: BNP: 312.5 pg/mL — ABNORMAL HIGH (ref 0.0–100.0)

## 2020-03-13 LAB — TSH: TSH: 3.41 u[IU]/mL (ref 0.450–4.500)

## 2020-03-13 MED ORDER — ACCU-CHEK GUIDE VI STRP: ORAL_STRIP | 3 refills | Status: DC

## 2020-03-13 MED ORDER — ACCU-CHEK FASTCLIX LANCET KIT: PACK | 1 refills | Status: DC

## 2020-03-13 NOTE — Telephone Encounter (Signed)
Rx's sent to pharmacy as requested. 

## 2020-03-17 ENCOUNTER — Ambulatory Visit (INDEPENDENT_AMBULATORY_CARE_PROVIDER_SITE_OTHER): Payer: Self-pay | Admitting: Family Medicine

## 2020-03-17 DIAGNOSIS — Z0289 Encounter for other administrative examinations: Secondary | ICD-10-CM

## 2020-03-24 ENCOUNTER — Other Ambulatory Visit: Payer: Self-pay

## 2020-03-24 ENCOUNTER — Encounter (INDEPENDENT_AMBULATORY_CARE_PROVIDER_SITE_OTHER): Payer: Self-pay | Admitting: Family Medicine

## 2020-03-24 ENCOUNTER — Ambulatory Visit (INDEPENDENT_AMBULATORY_CARE_PROVIDER_SITE_OTHER): Payer: Medicare Other | Admitting: Family Medicine

## 2020-03-24 VITALS — BP 145/73 | HR 87 | Temp 98.3°F | Ht 62.0 in | Wt 224.0 lb

## 2020-03-24 DIAGNOSIS — I509 Heart failure, unspecified: Secondary | ICD-10-CM | POA: Diagnosis not present

## 2020-03-24 DIAGNOSIS — N1831 Chronic kidney disease, stage 3a: Secondary | ICD-10-CM | POA: Diagnosis not present

## 2020-03-24 DIAGNOSIS — E559 Vitamin D deficiency, unspecified: Secondary | ICD-10-CM

## 2020-03-24 DIAGNOSIS — Z1331 Encounter for screening for depression: Secondary | ICD-10-CM

## 2020-03-24 DIAGNOSIS — Z794 Long term (current) use of insulin: Secondary | ICD-10-CM

## 2020-03-24 DIAGNOSIS — R0602 Shortness of breath: Secondary | ICD-10-CM

## 2020-03-24 DIAGNOSIS — E1122 Type 2 diabetes mellitus with diabetic chronic kidney disease: Secondary | ICD-10-CM

## 2020-03-24 DIAGNOSIS — E1121 Type 2 diabetes mellitus with diabetic nephropathy: Secondary | ICD-10-CM | POA: Diagnosis not present

## 2020-03-24 DIAGNOSIS — Z6841 Body Mass Index (BMI) 40.0 and over, adult: Secondary | ICD-10-CM | POA: Diagnosis not present

## 2020-03-24 DIAGNOSIS — R5383 Other fatigue: Secondary | ICD-10-CM | POA: Diagnosis not present

## 2020-03-24 DIAGNOSIS — E66813 Obesity, class 3: Secondary | ICD-10-CM

## 2020-03-24 NOTE — Progress Notes (Signed)
Chief Complaint:   OBESITY Regina [name] (MR# [id number]) is a 79 y.o. female who presents for evaluation and treatment of obesity and related comorbidities. Current BMI is Body mass index is 40.97 kg/m. Regina [name] has been struggling with her weight for many years and has been unsuccessful in either losing weight, maintaining weight loss, or reaching her healthy weight goal.  Regina [name] is also in our program.  Regina [name] is currently in the action stage of change and ready to dedicate time achieving and maintaining a healthier weight. Regina [name] is interested in becoming our patient and working on intensive lifestyle modifications including (but not limited to) diet and exercise for weight loss.  Regina [name]'s habits were reviewed today and are as follows: Her family eats meals together, she thinks her family will eat healthier with her, her desired weight loss is 64 lbs, she has been heavy most of her life, she started gaining weight after child birth, her heaviest weight ever was 246 pounds, she has significant food cravings issues, she snacks frequently in the evenings, she skips meals frequently, she frequently makes poor food choices, she frequently eats larger portions than normal and she struggles with emotional eating.  Depression Screen Regina [name]'s Food and Mood (modified PHQ-9) score was 7.  Depression screen PHQ 2/9 03/24/2020  Decreased Interest 1  Down, Depressed, Hopeless 0  PHQ - 2 Score 1  Altered sleeping 3  Tired, decreased energy 3  Change in appetite 0  Feeling bad or failure about yourself  0  Trouble concentrating 0  Moving slowly or fidgety/restless 0  Suicidal thoughts 0  PHQ-9 Score 7  Difficult doing work/chores Somewhat difficult   Subjective:   1. Other fatigue Regina [name] admits to daytime somnolence and denies waking up still tired. Patent has a history of symptoms of daytime fatigue. Regina [name] generally gets 8 or 9 hours of sleep per night, and states that she has  generally restful sleep. Snoring is present. Apneic episodes are present. Epworth Sleepiness Score is 4.  2. Short of breath on exertion Regina [name] notes increasing shortness of breath with exercising and seems to be worsening over time with weight gain. She notes getting out of breath sooner with activity than she used to. This has not gotten worse recently. Regina [name] denies shortness of breath at rest or orthopnea.  3. Type 2 diabetes mellitus with stage 3a chronic kidney disease, with long-term current use of insulin (HCC) Regina [name]'s fasting BGs averages approximately between 72 and 173 (mostly 110-150), and 2 hour post prandial range between 124 and 214. She is no longer on Tradjenta. Last A1c was 6.9.  4. Vitamin D deficiency Regina [name] has not had a Vit D level drawn recently. She notes fatigue.  5. Other congestive heart failure [facility]) Regina [name] is followed by Cardiology and she has an echocardiogram scheduled soon. Her BNP is in the 300's, and she has a history of non-obstructive coronary artery disease.  Assessment/Plan:   1. Other fatigue Regina [name] does feel that her weight is causing her energy to be lower than it should be. Fatigue may be related to obesity, depression or many other causes. Labs will be ordered, and in the meanwhile, Zi will focus on self care including making healthy food choices, increasing physical activity and focusing on stress reduction.  - Comprehensive metabolic panel - Lipid Panel With LDL/HDL Ratio - VITAMIN D 25 Hydroxy (Vit-D Deficiency, Fractures)  2. Short of breath on exertion Regina [name] does feel that she gets out of breath  more easily that she used to when she exercises. Regina [name]'s shortness of breath appears to be obesity related and exercise induced. She has agreed to work on weight loss and gradually increase exercise to treat her exercise induced shortness of breath. Will continue to monitor closely.  3. Type 2 diabetes mellitus with stage 3a chronic kidney disease, with  long-term current use of insulin (HCC) Good blood sugar control is important to decrease the likelihood of diabetic complications such as nephropathy, neuropathy, limb loss, blindness, coronary artery disease, and death. Intensive lifestyle modification including diet, exercise and weight loss are the first line of treatment for diabetes. We will check labs today. Regina [name] will start her diet prescription and will follow up as directed.  - Hemoglobin A1c - Comprehensive metabolic panel - Lipid Panel With LDL/HDL Ratio  4. Vitamin D deficiency Low Vitamin D level contributes to fatigue and are associated with obesity, breast, and colon cancer. We will check labs today. Regina [name] will follow-up for routine testing of Vitamin D, at least 2-3 times per year to avoid over-replacement.  - VITAMIN D 25 Hydroxy (Vit-D Deficiency, Fractures)  5. Other congestive heart failure ([place]) Regina [name] is to start her diet prescription and we will follow closely.  - Comprehensive metabolic panel - Lipid Panel With LDL/HDL Ratio  6. Depression screening Regina [name] had a positive depression screening. Depression is commonly associated with obesity and often results in emotional eating behaviors. We will monitor this closely and work on CBT to help improve the non-hunger eating patterns. Referral to Psychology may be required if no improvement is seen as she continues in our clinic.  7. Class 3 severe obesity with serious comorbidity and body mass index (BMI) of 40.0 to 44.9 in adult, unspecified obesity type [facility]) Regina [name] is currently in the action stage of change and her goal is to continue with weight loss efforts. I recommend Regina [name] begin the structured treatment plan as follows:  She has agreed to the Category 2 Plan.  Exercise goals: No exercise has been prescribed for now, while we concentration on nutritional changes.   Behavioral modification strategies: no skipping meals and meal planning and cooking strategies.  She  was informed of the importance of frequent follow-up visits to maximize her success with intensive lifestyle modifications for her multiple health conditions. She was informed we would discuss her lab results at her next visit unless there is a critical issue that needs to be addressed sooner. Regina [name] agreed to keep her next visit at the agreed upon time to discuss these results.  Objective:   Blood pressure (!) 145/73, pulse 87, temperature 98.3 F (36.8 C), temperature source Oral, height 5\' 2"  (1.575 m), weight 224 lb (101.6 kg), SpO2 99 %. Body mass index is 40.97 kg/m.  EKG: Normal sinus rhythm, tachycardia rate 107 BPM.  Indirect Calorimeter completed today shows a VO2 of 216 and a REE of 1503.  Her calculated basal metabolic rate is 6144 thus her basal metabolic rate is worse than expected.  General: Cooperative, alert, well developed, in no acute distress. HEENT: Conjunctivae and lids unremarkable. Cardiovascular: Regular rhythm.  Lungs: Normal work of breathing. Neurologic: No focal deficits.   Lab Results  Component Value Date   CREATININE 1.73 (H) 03/12/2020   BUN 31 (H) 03/12/2020   NA 140 03/12/2020   K 4.3 03/12/2020   CL 101 03/12/2020   CO2 23 03/12/2020   Lab Results  Component Value Date   ALT 12 05/22/2019   AST 20 05/22/2019  ALKPHOS 71 05/22/2019   BILITOT 0.3 05/22/2019   Lab Results  Component Value Date   HGBA1C 6.9 (A) 11/12/2019   HGBA1C 7.8 (H) 09/09/2019   HGBA1C 7.7 (H) 05/22/2019   HGBA1C 7.4 (H) 01/16/2015   No results found for: INSULIN Lab Results  Component Value Date   TSH 3.410 03/12/2020   Lab Results  Component Value Date   CHOL 98 05/22/2019   HDL 44.70 05/22/2019   LDLCALC 30 05/22/2019   TRIG 119.0 05/22/2019   CHOLHDL 2 05/22/2019   Lab Results  Component Value Date   WBC 8.0 05/22/2019   HGB 11.0 (L) 05/22/2019   HCT 33.2 (L) 05/22/2019   MCV 86.9 05/22/2019   PLT 295.0 05/22/2019   No results found for: IRON,  TIBC, FERRITIN Obesity Behavioral Intervention Visit Documentation for Insurance:   Approximately 15 minutes were spent on the discussion below.  ASK: We discussed the diagnosis of obesity with Regina [name] today and Regina [name] agreed to give [place] permission to discuss obesity behavioral modification therapy today.  ASSESS: Regina [name] has the diagnosis of obesity and her BMI today is 40.96. Regina [name] is in the action stage of change.   ADVISE: Charlott was educated on the multiple health risks of obesity as well as the benefit of weight loss to improve her health. She was advised of the need for long term treatment and the importance of lifestyle modifications to improve her current health and to decrease her risk of future health problems.  AGREE: Multiple dietary modification options and treatment options were discussed and Kayleanna agreed to follow the recommendations documented in the above note.  ARRANGE: Anay was educated on the importance of frequent visits to treat obesity as outlined per CMS and USPSTF guidelines and agreed to schedule her next follow up appointment today.  Attestation Statements:   Reviewed by clinician on day of visit: allergies, medications, problem list, medical history, surgical history, family history, social history, and previous encounter notes.   I, Trixie Dredge, am acting as transcriptionist for Dennard Nip, MD.  I have reviewed the above documentation for accuracy and completeness, and I agree with the above. - Dennard Nip, MD

## 2020-03-25 LAB — COMPREHENSIVE METABOLIC PANEL
ALT: 11 IU/L (ref 0–32)
AST: 19 IU/L (ref 0–40)
Albumin/Globulin Ratio: 1.3 (ref 1.2–2.2)
Albumin: 4.8 g/dL — ABNORMAL HIGH (ref 3.7–4.7)
Alkaline Phosphatase: 85 IU/L (ref 48–121)
BUN/Creatinine Ratio: 25 (ref 12–28)
BUN: 42 mg/dL — ABNORMAL HIGH (ref 8–27)
Bilirubin Total: 0.3 mg/dL (ref 0.0–1.2)
CO2: 27 mmol/L (ref 20–29)
Calcium: 10.1 mg/dL (ref 8.7–10.3)
Chloride: 95 mmol/L — ABNORMAL LOW (ref 96–106)
Creatinine, Ser: 1.68 mg/dL — ABNORMAL HIGH (ref 0.57–1.00)
GFR calc Af Amer: 34 mL/min/{1.73_m2} — ABNORMAL LOW (ref >59)
GFR calc non Af Amer: 29 mL/min/{1.73_m2} — ABNORMAL LOW (ref >59)
Globulin, Total: 3.6 g/dL (ref 1.5–4.5)
Glucose: 166 mg/dL — ABNORMAL HIGH (ref 65–99)
Potassium: 4.5 mmol/L (ref 3.5–5.2)
Sodium: 137 mmol/L (ref 134–144)
Total Protein: 8.4 g/dL (ref 6.0–8.5)

## 2020-03-25 LAB — VITAMIN D 25 HYDROXY (VIT D DEFICIENCY, FRACTURES): Vit D, 25-Hydroxy: 55.3 ng/mL (ref 30.0–100.0)

## 2020-03-25 LAB — LIPID PANEL WITH LDL/HDL RATIO
Cholesterol, Total: 117 mg/dL (ref 100–199)
HDL: 56 mg/dL (ref >39)
LDL Chol Calc (NIH): 49 mg/dL (ref 0–99)
LDL/HDL Ratio: 0.9 ratio (ref 0.0–3.2)
Triglycerides: 54 mg/dL (ref 0–149)
VLDL Cholesterol Cal: 12 mg/dL (ref 5–40)

## 2020-03-25 LAB — HEMOGLOBIN A1C
Est. average glucose Bld gHb Est-mCnc: 171 mg/dL
Hgb A1c MFr Bld: 7.6 % — ABNORMAL HIGH (ref 4.8–5.6)

## 2020-03-31 ENCOUNTER — Ambulatory Visit (INDEPENDENT_AMBULATORY_CARE_PROVIDER_SITE_OTHER): Payer: Self-pay | Admitting: Family Medicine

## 2020-04-03 ENCOUNTER — Ambulatory Visit (HOSPITAL_COMMUNITY): Payer: Medicare Other | Attending: Cardiology

## 2020-04-03 ENCOUNTER — Other Ambulatory Visit: Payer: Self-pay

## 2020-04-03 DIAGNOSIS — R0602 Shortness of breath: Secondary | ICD-10-CM | POA: Diagnosis not present

## 2020-04-07 ENCOUNTER — Encounter (INDEPENDENT_AMBULATORY_CARE_PROVIDER_SITE_OTHER): Payer: Self-pay | Admitting: Family Medicine

## 2020-04-07 ENCOUNTER — Ambulatory Visit (INDEPENDENT_AMBULATORY_CARE_PROVIDER_SITE_OTHER): Payer: Medicare Other | Admitting: Family Medicine

## 2020-04-07 ENCOUNTER — Other Ambulatory Visit: Payer: Self-pay

## 2020-04-07 VITALS — BP 151/79 | HR 100 | Temp 98.3°F | Ht 62.0 in | Wt 220.0 lb

## 2020-04-07 DIAGNOSIS — Z6841 Body Mass Index (BMI) 40.0 and over, adult: Secondary | ICD-10-CM

## 2020-04-07 DIAGNOSIS — I10 Essential (primary) hypertension: Secondary | ICD-10-CM | POA: Diagnosis not present

## 2020-04-07 DIAGNOSIS — E119 Type 2 diabetes mellitus without complications: Secondary | ICD-10-CM

## 2020-04-07 DIAGNOSIS — Z794 Long term (current) use of insulin: Secondary | ICD-10-CM

## 2020-04-07 MED ORDER — OZEMPIC (1 MG/DOSE) 2 MG/1.5ML ~~LOC~~ SOPN: 1.0000 mg | PEN_INJECTOR | SUBCUTANEOUS | 0 refills | Status: DC

## 2020-04-08 NOTE — Progress Notes (Signed)
Chief Complaint:   OBESITY Regina [name] is here to discuss her progress with her obesity treatment plan along with follow-up of her obesity related diagnoses. Regina [name] is on the Category 2 Plan and states she is following her eating plan approximately 100% of the time. Regina [name] states she is doing 0 minutes 0 times per week.  Today's visit was #: 2 Starting weight: 224 lbs Starting date: 03/24/2020 Today's weight: 220 lbs Today's date: 04/07/2020 Total lbs lost to date: 4 Total lbs lost since last in-office visit: 4  Interim History: Regina [name] has done very well with weight loss on her Category 2 plan. Her hunger is controlled and she did well with her meal plan and prepping.  Subjective:   1. Type 2 diabetes mellitus without complication, with long-term current use of insulin (HCC) Regina [name]'s A1c is >7.0, and she has been checking her BGs BID and averages approximately between 120 and 180. She has missed her Ozempic recently and she doesn't know how to start again after missing a dose.  2. Essential hypertension Regina [name]'s blood pressure is slightly elevated today. She has done well with diet and exercise. She denies chest pain or headaches.  Assessment/Plan:   1. Type 2 diabetes mellitus without complication, with long-term current use of insulin (HCC) Good blood sugar control is important to decrease the likelihood of diabetic complications such as nephropathy, neuropathy, limb loss, blindness, coronary artery disease, and death. Intensive lifestyle modification including diet, exercise and weight loss are the first line of treatment for diabetes. Regina [name] will continue Toujeo at 30 units unless her BGs drop below 80, then decrease to 25 units. We will refill Ozempic for 1 month, she is to take Ozempic the next day if she misses a dose.   2. Essential hypertension Regina [name] will continue medications, healthy weight loss, diet, and exercise to improve blood pressure control. We will watch for signs of  hypotension as she continues her lifestyle modifications. We will recheck her blood pressure in 2 weeks.  3. Class 3 severe obesity with serious comorbidity and body mass index (BMI) of 40.0 to 44.9 in adult, unspecified obesity type [facility]) Regina [name] is currently in the action stage of change. As such, her goal is to continue with weight loss efforts. She has agreed to the Category 2 Plan.   Behavioral modification strategies: increasing lean protein intake.  Regina [name] has agreed to follow-up with our clinic in 2 weeks. She was informed of the importance of frequent follow-up visits to maximize her success with intensive lifestyle modifications for her multiple health conditions.   Objective:   Blood pressure (!) 151/79, pulse 100, temperature 98.3 F (36.8 C), temperature source Oral, height 5\' 2"  (1.575 m), weight 220 lb (99.8 kg), SpO2 98 %. Body mass index is 40.24 kg/m.  General: Cooperative, alert, well developed, in no acute distress. HEENT: Conjunctivae and lids unremarkable. Cardiovascular: Regular rhythm.  Lungs: Normal work of breathing. Neurologic: No focal deficits.   Lab Results  Component Value Date   CREATININE 1.68 (H) 03/24/2020   BUN 42 (H) 03/24/2020   NA 137 03/24/2020   K 4.5 03/24/2020   CL 95 (L) 03/24/2020   CO2 27 03/24/2020   Lab Results  Component Value Date   ALT 11 03/24/2020   AST 19 03/24/2020   ALKPHOS 85 03/24/2020   BILITOT 0.3 03/24/2020   Lab Results  Component Value Date   HGBA1C 7.6 (H) 03/24/2020   HGBA1C 6.9 (A) 11/12/2019   HGBA1C 7.8 (H)  09/09/2019   HGBA1C 7.7 (H) 05/22/2019   HGBA1C 7.4 (H) 01/16/2015   No results found for: INSULIN Lab Results  Component Value Date   TSH 3.410 03/12/2020   Lab Results  Component Value Date   CHOL 117 03/24/2020   HDL 56 03/24/2020   LDLCALC 49 03/24/2020   TRIG 54 03/24/2020   CHOLHDL 2 05/22/2019   Lab Results  Component Value Date   WBC 8.0 05/22/2019   HGB 11.0 (L) 05/22/2019   HCT  33.2 (L) 05/22/2019   MCV 86.9 05/22/2019   PLT 295.0 05/22/2019   No results found for: IRON, TIBC, FERRITIN  Obesity Behavioral Intervention Documentation for Insurance:   Approximately 15 minutes were spent on the discussion below.  ASK: We discussed the diagnosis of obesity with Jadeyn today and Dianna agreed to give [place] permission to discuss obesity behavioral modification therapy today.  ASSESS: Banessa has the diagnosis of obesity and her BMI today is 40.23. Lidya is in the action stage of change.   ADVISE: Elzada was educated on the multiple health risks of obesity as well as the benefit of weight loss to improve her health. She was advised of the need for long term treatment and the importance of lifestyle modifications to improve her current health and to decrease her risk of future health problems.  AGREE: Multiple dietary modification options and treatment options were discussed and Prudence agreed to follow the recommendations documented in the above note.  ARRANGE: Fredericka was educated on the importance of frequent visits to treat obesity as outlined per CMS and USPSTF guidelines and agreed to schedule her next follow up appointment today.  Attestation Statements:   Reviewed by clinician on day of visit: allergies, medications, problem list, medical history, surgical history, family history, social history, and previous encounter notes.   I, Trixie Dredge, am acting as transcriptionist for Dennard Nip, MD.  I have reviewed the above documentation for accuracy and completeness, and I agree with the above. -  Dennard Nip, MD

## 2020-04-09 ENCOUNTER — Telehealth: Payer: Self-pay | Admitting: Family Medicine

## 2020-04-09 NOTE — Telephone Encounter (Signed)
Patient is needing a Rx refilled that I am not seeing on her current medication list  Azilsartan-Chlorthalidone 40-25 [place], [place] [facility] Phone:  [phone]  Fax:  [phone]      This Rx was in the patients chart in the history Start date 11/18/2016 end date 04/07/2020

## 2020-04-10 ENCOUNTER — Other Ambulatory Visit: Payer: Self-pay | Admitting: Family Medicine

## 2020-04-10 DIAGNOSIS — I10 Essential (primary) hypertension: Secondary | ICD-10-CM

## 2020-04-10 MED ORDER — AZILSARTAN-CHLORTHALIDONE 40-25 MG PO TABS: 1.0000 | ORAL_TABLET | Freq: Every day | ORAL | 1 refills | Status: DC

## 2020-04-10 NOTE — Telephone Encounter (Signed)
Pt is calling in stating that she is out of her morning blood pressure medication Azilsartan-Chlorthalidone  (EDARBYCLOR)40-25 MG that she had gotten from a previous provider (Dr. Kellie Shropshire) and not the prescription has run out and she needs to get it from the mail order [facility] or it can be sent to the Pharm:  [facility] in [place] this time since she is out but would like for it to come from the mail order pharmacy next time.

## 2020-04-10 NOTE — Telephone Encounter (Signed)
Med list was not up to date, medication added. Rx sent. Thanks, BJ

## 2020-04-10 NOTE — Telephone Encounter (Signed)
Please advise 

## 2020-04-10 NOTE — Telephone Encounter (Signed)
Please advised pt stated she was receiving the medication from another provider and needs a refills bc she is out. Pt wasn't to know if Dr.jordan can prescribed the same medication since it controls her Bp.   Please advise

## 2020-04-12 ENCOUNTER — Other Ambulatory Visit: Payer: Self-pay | Admitting: Family Medicine

## 2020-04-12 NOTE — Progress Notes (Signed)
Cardiology Office Note:   Date:  04/14/2020  NAME:  Regina [name]    MRN: [id number] DOB:  Nov 12, 1940   PCP:  [name], [name], MD  Cardiologist:  No primary care provider on file.  Electrophysiologist:  None   Referring MD: [name], [name], MD   Chief Complaint  Patient presents with  . Congestive Heart Failure   History of Present Illness:   Regina [name] is a 79 y.o. female with a hx of CAD, HFpEF, DM, HLD, HTN who presents for follow-up of SOB. Recent echo with Grade 2 DD. BNP elevated. Likely HFpEF.  She reports her shortness of breath is much improved with Lasix 20 mg daily.  Her blood pressure today is 140/64.  Her weights are down from 236 to 228 pounds.  This is an 8 pound weight loss.  She is actually met with the [facility] to discuss improving her diet.  She is motivated.  We did discuss extensively salt reductive strategies.  She is going to start exercising as well.  She does have bilateral knee replacements so heavy activity is out of the question but I have encouraged her to do activities such as water aerobics.  She reports that her symptoms of shortness of breath and intermittent episodes of chest pain are improving.  We did go over the results of her echocardiogram which shows diastolic heart failure with her and her daughter today.  I have recommended a PYP scan to exclude cardiac amyloidosis.  She does have rather low tissue Dopplers.  We will plan to see her back in 3 months after continued improvement in diet and diuretic therapy.  She also needs to see a kidney doctor.  We will refer her today.  Problem List 1. DM -A1c 7.6 2. HLD -T chol 98, HDL 44, LDL 30, TG 119 3. HTN 4. Non-obstructive CAD -LHC 01/19/2015 [facility] -50% mid RCA and 50% LAD 5. OSA 6. HFpEF -Grade 2DD -BNP 312 7. CKD 3  Past Medical History: Past Medical History:  Diagnosis Date  . Back pain   . CHF (congestive heart failure) ([place])   . Diabetes  mellitus without complication ([place])   . Dyspnea   . GERD (gastroesophageal reflux disease)   . H/O echocardiogram    a. 01/2015 Echo: EF 55-60%, Gr 2 DD, mod LVH, mildly dil LA.  Marland Kitchen Heart disease   . Hypertension   . Hypothyroidism   . Joint pain   . Kidney problem   . Lower extremity edema   . Mini stroke ([place])   . Non-obstructive CAD    a. 01/2015 Cardiolite: + inf wall ischemia, EF 56%;  b. 01/2015 Cath: LM nl, LAD 21m LCX min irregs, RCA dominant, 50-672m . Sleep apnea   . Swallowing difficulty   . Thyroid disease     Past Surgical History: Past Surgical History:  Procedure Laterality Date  . ABDOMINAL HYSTERECTOMY    . CARDIAC CATHETERIZATION    . ESOPHAGOGASTRODUODENOSCOPY     a. 01/2015 EGD: patent esophagus.  . ESOPHAGOGASTRODUODENOSCOPY N/A 01/20/2015   Procedure: ESOPHAGOGASTRODUODENOSCOPY (EGD);  Surgeon: PaCarol AdaMD;  [facility];  Service: Endoscopy;  Laterality: N/A;  . HAND SURGERY    . JOINT REPLACEMENT     reports history bilateral TKA and right TSA  . LEFT HEART CATHETERIZATION WITH CORONARY ANGIOGRAM N/A 01/19/2015  . THYROIDECTOMY      Current Medications: Current Meds  Medication Sig  .  Alcohol Swabs (B-D SINGLE USE SWABS REGULAR) PADS Use to test blood sugar up to 3 times daily  . allopurinol (ZYLOPRIM) 100 MG tablet Take 1 tablet (100 mg total) by mouth daily.  . Ascorbic Acid (VITA-C PO) Take by mouth daily.  Marland Kitchen aspirin 81 MG tablet Take 81 mg by mouth daily.  Marland Kitchen atorvastatin (LIPITOR) 20 MG tablet Take 1 tablet (20 mg total) by mouth daily.  . Azilsartan-Chlorthalidone 40-25 MG TABS Take 1 tablet by mouth daily.  . Blood Glucose Monitoring Suppl (ACCU-CHEK GUIDE) w/Device KIT 1 Device by Does not apply route 3 (three) times daily.  . clotrimazole-betamethasone (LOTRISONE) cream Apply 1 application topically 2 (two) times daily as needed.  . cyclobenzaprine (FLEXERIL) 5 MG tablet Take 1 tablet by mouth twice daily  . furosemide (LASIX) 20  MG tablet Take 1 tablet (20 mg total) by mouth daily.  Marland Kitchen gabapentin (NEURONTIN) 600 MG tablet Take 1 tablet by mouth twice daily  . glucose blood (ACCU-CHEK GUIDE) test strip Use to test blood sugar up to 3 times daily  . Insulin Glargine, 1 Unit Dial, (TOUJEO SOLOSTAR) 300 UNIT/ML SOPN Inject 35 Units into the skin daily.  . Iron-Vitamins (GERITOL COMPLETE PO) Take 1 tablet by mouth daily.  Marland Kitchen lactulose (CHRONULAC) 10 GM/15ML solution Take 20 g by mouth daily.  Marland Kitchen lactulose (CHRONULAC) 10 GM/15ML solution Take 15 mLs by mouth daily as needed for constipation.  . Lancets Misc. (ACCU-CHEK FASTCLIX LANCET) KIT Use to test blood sugar up to 3 times daily  . levothyroxine (SYNTHROID) 150 MCG tablet Take 150 mcg by mouth daily.  Marland Kitchen loratadine (CLARITIN) 10 MG tablet Take 1 tablet (10 mg total) by mouth daily as needed.  . magnesium oxide (MAG-OX) 400 MG tablet Take 1 tablet by mouth once daily  . meclizine (ANTIVERT) 25 MG tablet Take 25 mg by mouth daily as needed.  . Omega-3 Fatty Acids (FISH OIL) 1000 MG CAPS Take by mouth daily.  Marland Kitchen omeprazole (PRILOSEC) 40 MG capsule Take 1 capsule (40 mg total) by mouth daily.  Marland Kitchen OZEMPIC, 1 MG/DOSE, 2 MG/1.5ML SOPN Inject 0.75 mLs (1 mg total) into the skin once a week.  . terazosin (HYTRIN) 5 MG capsule Take 1 capsule (5 mg total) by mouth daily.  Marland Kitchen VITAMIN D PO Take by mouth daily.     Allergies:    Ace inhibitors, Amlodipine, Atenolol, Avandia [rosiglitazone], Darvon [propoxyphene], Erythromycin, Hydralazine, Hydrocodone, Levofloxacin, Morphine and related, Percocet [oxycodone-acetaminophen], Spironolactone, and Tramadol   Social History: Social History   Socioeconomic History  . Marital status: Widowed    Spouse name: Not on file  . Number of children: Not on file  . Years of education: Not on file  . Highest education level: Not on file  Occupational History  . Occupation: Retired  Tobacco Use  . Smoking status: Never Smoker  . Smokeless tobacco:  Never Used  Substance and Sexual Activity  . Alcohol use: No  . Drug use: Not on file  . Sexual activity: Not on file  Other Topics Concern  . Not on file  Social History Narrative  . Not on file   Social Determinants of Health   Financial Resource Strain:   . Difficulty of Paying Living Expenses:   Food Insecurity:   . Worried About Charity fundraiser in the Last Year:   . Arboriculturist in the Last Year:   Transportation Needs:   . Film/video editor (Medical):   Marland Kitchen Lack  of Transportation (Non-Medical):   Physical Activity:   . Days of Exercise per Week:   . Minutes of Exercise per Session:   Stress:   . Feeling of Stress :   Social Connections:   . Frequency of Communication with Friends and Family:   . Frequency of Social Gatherings with Friends and Family:   . Attends Religious Services:   . Active Member of Clubs or Organizations:   . Attends Archivist Meetings:   Marland Kitchen Marital Status:      Family History: The patient's family history includes Alcoholism in her father; Cancer in her father; Heart disease in her mother; Hypertension in her mother.  ROS:   All other ROS reviewed and negative. Pertinent positives noted in the HPI.     EKGs/Labs/Other Studies Reviewed:   The following studies were personally reviewed by me today:  TTE 04/03/2020 1. Left ventricular ejection fraction, by estimation, is 55 to 60%. The  left ventricle has normal function. The left ventricle has no regional  wall motion abnormalities. There is moderate concentric left ventricular  hypertrophy. Left ventricular  diastolic parameters are consistent with Grade II diastolic dysfunction  (pseudonormalization). Elevated left atrial pressure.  2. Right ventricular systolic function is normal. The right ventricular  size is normal. There is normal pulmonary artery systolic pressure.  3. Left atrial size was mildly dilated.  4. The mitral valve is normal in structure. Mild  mitral valve  regurgitation. No evidence of mitral stenosis.  5. The aortic valve is normal in structure. Aortic valve regurgitation is  trivial. No aortic stenosis is present.  6. The inferior vena cava is normal in size with greater than 50%  respiratory variability, suggesting right atrial pressure of 3 mmHg.   Recent Labs: 05/22/2019: Hemoglobin 11.0; Platelets 295.0 03/12/2020: BNP 312.5; TSH 3.410 03/24/2020: ALT 11; BUN 42; Creatinine, Ser 1.68; Potassium 4.5; Sodium 137   Recent Lipid Panel    Component Value Date/Time   CHOL 117 03/24/2020 1428   TRIG 54 03/24/2020 1428   HDL 56 03/24/2020 1428   CHOLHDL 2 05/22/2019 1631   VLDL 23.8 05/22/2019 1631   LDLCALC 49 03/24/2020 1428    Physical Exam:   VS:  BP 140/64   Pulse 95   Ht _0  (1.575 m)   Wt 228 lb 12.8 oz (103.8 kg)   SpO2 95%   BMI 41.85 kg/m    Wt Readings from Last 3 Encounters:  04/14/20 228 lb 12.8 oz (103.8 kg)  04/07/20 220 lb (99.8 kg)  03/24/20 224 lb (101.6 kg)    General: Well nourished, well developed, in no acute distress Heart: Atraumatic, normal size  Eyes: PEERLA, EOMI  Neck: Supple, no JVD Endocrine: No thryomegaly Cardiac: Normal S1, S2; RRR; no murmurs, rubs, or gallops Lungs: Clear to auscultation bilaterally, no wheezing, rhonchi or rales  Abd: Soft, nontender, no hepatomegaly  Ext: Trace edema Musculoskeletal: No deformities, BUE and BLE strength normal and equal Skin: Warm and dry, no rashes   Neuro: Alert and oriented to person, place, time, and situation, CNII-XII grossly intact, no focal deficits  Psych: Normal mood and affect   ASSESSMENT:   Regina [name] is a 79 y.o. female who presents for the following: 1. SOB (shortness of breath)   2. Chronic diastolic heart failure ([place])   3. Coronary artery disease involving native coronary artery of native heart without angina pectoris   4. Mixed hyperlipidemia   5. Obesity, morbid, BMI 40.0-49.9 ([place])  6. Stage 3b chronic  kidney disease     PLAN:   1. SOB (shortness of breath) 2. Chronic diastolic heart failure ([place]) -She clearly has heart failure with preserved ejection fraction.  She had elevated BNP and shortness of breath.  She has grade 2 diastolic dysfunction.  Her tissue Dopplers are very low.  I would like to proceed with a PYP scan to exclude cardiac amyloidosis.  She does have CKD as well which does raise suspicion for this.  This could be garden-variety HFpEF in the setting of diabetes and uncontrolled hypertension and obesity.  But we will exclude amyloidosis. -We will stop her ARB-chlorthalidone tablet.  We will start her on 50 mg of losartan.  She will continue Lasix 20 mg daily. -She does have CKD stage III.  She needs referral to nephrology we will make that today. -She will start exercise.  She has had her sleep apnea machine evaluated recently.  She will avoid salty foods.  We spent an extensive amount of time going over her diet and we will give her some information about how to improve this.  Overall she is lost 8 pounds and I think she is heading in the right direction.  3. Coronary artery disease involving native coronary artery of native heart without angina pectoris -Nonobstructive CAD on left heart cath in 2016 in [facility].  No symptoms concerning for angina this time.  We may pursue a cardiac stress test but we will pursue amyloid study and continue with diuresis.  4. Mixed hyperlipidemia -Most recent LDL cholesterol 30.  Continue statin.  5. Obesity, morbid, BMI 40.0-49.9 ([facility]) -Diet and exercise  6. Stage 3b chronic kidney disease -Referral to nephrology  Disposition: Return in about 3 months (around 07/15/2020).  Medication Adjustments/Labs and Tests Ordered: Current medicines are reviewed at length with the patient today.  Concerns regarding medicines are outlined above.  Orders Placed This Encounter  Procedures  . Ambulatory referral to Nephrology  . MYOCARDIAL  AMYLOID IMAGING PLANAR AND SPECT   No orders of the defined types were placed in this encounter.   Patient Instructions  Medication Instructions:  Stop Azilsartan-Chlorthalidone medication Start Losartan 50 mg daily    *If you need a refill on your cardiac medications before your next appointment, please call your pharmacy*   Testing/Procedures: AMYLOID IMAGING    Follow-Up: At [facility], you and your health needs are our priority.  As part of our continuing mission to provide you with exceptional heart care, we have created designated Provider Care Teams.  These Care Teams include your primary Cardiologist (physician) and Advanced Practice Providers (APPs -  Physician Assistants and Nurse Practitioners) who all work together to provide you with the care you need, when you need it.  We recommend signing up for the patient portal called "MyChart".  Sign up information is provided on this After Visit Summary.  MyChart is used to connect with patients for Virtual Visits (Telemedicine).  Patients are able to view lab/test results, encounter notes, upcoming appointments, etc.  Non-urgent messages can be sent to your provider as well.   To learn more about what you can do with MyChart, go to [email or web address].    Your next appointment:   3 month(s)  The format for your next appointment:   In Person  Provider:   [name], MD   Other Instructions Referral to Nephrology- they will contact you to set up an appointment.     Time Spent with Patient: I  have spent a total of 35 minutes with patient reviewing hospital notes, telemetry, EKGs, labs and examining the patient as well as establishing an assessment and plan that was discussed with the patient.  > 50% of time was spent in direct patient care.  Signed, Addison Naegeli. Audie Box, [place]  [address] [place], [place] [address] [phone]  04/14/2020 11:34 AM

## 2020-04-13 NOTE — Telephone Encounter (Signed)
Noted! Spoke to pt and advised of update. Pt stated that the pharmacy is filling it now.

## 2020-04-13 NOTE — Telephone Encounter (Signed)
Rx sent in 6/25.

## 2020-04-14 ENCOUNTER — Telehealth (HOSPITAL_COMMUNITY): Payer: Self-pay | Admitting: *Deleted

## 2020-04-14 ENCOUNTER — Ambulatory Visit: Payer: Medicare Other | Admitting: Cardiovascular Disease

## 2020-04-14 ENCOUNTER — Other Ambulatory Visit: Payer: Self-pay

## 2020-04-14 ENCOUNTER — Encounter: Payer: Self-pay | Admitting: Cardiovascular Disease

## 2020-04-14 VITALS — BP 140/64 | HR 95 | Ht 62.0 in | Wt 228.8 lb

## 2020-04-14 DIAGNOSIS — I5032 Chronic diastolic (congestive) heart failure: Secondary | ICD-10-CM | POA: Diagnosis not present

## 2020-04-14 DIAGNOSIS — I2510 Atherosclerotic heart disease of native coronary artery without angina pectoris: Secondary | ICD-10-CM | POA: Diagnosis not present

## 2020-04-14 DIAGNOSIS — N1832 Chronic kidney disease, stage 3b: Secondary | ICD-10-CM

## 2020-04-14 DIAGNOSIS — R0602 Shortness of breath: Secondary | ICD-10-CM | POA: Diagnosis not present

## 2020-04-14 DIAGNOSIS — E782 Mixed hyperlipidemia: Secondary | ICD-10-CM | POA: Diagnosis not present

## 2020-04-14 MED ORDER — LOSARTAN POTASSIUM 50 MG PO TABS
50.0000 mg | ORAL_TABLET | Freq: Every day | ORAL | 3 refills | Status: DC
Start: 2020-04-14 — End: 2020-04-26

## 2020-04-14 NOTE — Patient Instructions (Signed)
Medication Instructions:  Stop Azilsartan-Chlorthalidone medication Start Losartan 50 mg daily    *If you need a refill on your cardiac medications before your next appointment, please call your pharmacy*   Testing/Procedures: AMYLOID IMAGING    Follow-Up: At [facility], you and your health needs are our priority.  As part of our continuing mission to provide you with exceptional heart care, we have created designated Provider Care Teams.  These Care Teams include your primary Cardiologist (physician) and Advanced Practice Providers (APPs -  Physician Assistants and Nurse Practitioners) who all work together to provide you with the care you need, when you need it.  We recommend signing up for the patient portal called "MyChart".  Sign up information is provided on this After Visit Summary.  MyChart is used to connect with patients for Virtual Visits (Telemedicine).  Patients are able to view lab/test results, encounter notes, upcoming appointments, etc.  Non-urgent messages can be sent to your provider as well.   To learn more about what you can do with MyChart, go to [email or web address].    Your next appointment:   3 month(s)  The format for your next appointment:   In Person  Provider:   [name], MD   Other Instructions Referral to Nephrology- they will contact you to set up an appointment.

## 2020-04-14 NOTE — Telephone Encounter (Signed)
Left message on voicemail per DPR in reference to upcoming appointment scheduled on 04/16/2020 at 1100 with detailed instructions given per Amyloid Study Information Sheet for the test. LM to arrive 15 minutes early, and that it is imperative to arrive on time for appointment to keep from having the test rescheduled. If you need to cancel or reschedule your appointment, please call the office within 24 hours of your appointment. Failure to do so may result in a cancellation of your appointment, and a $50 no show fee. Phone number given for call back for any questions. [name], Regina [place]

## 2020-04-16 ENCOUNTER — Other Ambulatory Visit: Payer: Self-pay

## 2020-04-16 ENCOUNTER — Ambulatory Visit (HOSPITAL_COMMUNITY): Payer: Medicare Other | Attending: Cardiovascular Disease

## 2020-04-16 DIAGNOSIS — I5032 Chronic diastolic (congestive) heart failure: Secondary | ICD-10-CM | POA: Diagnosis not present

## 2020-04-16 MED ORDER — TECHNETIUM TC 99M PYROPHOSPHATE
21.3000 | Freq: Once | INTRAVENOUS | Status: AC
  Administered 2020-04-16: 21.3 via INTRAVENOUS

## 2020-04-17 ENCOUNTER — Other Ambulatory Visit: Payer: Self-pay | Admitting: Cardiovascular Disease

## 2020-04-17 ENCOUNTER — Telehealth: Payer: Self-pay | Admitting: Cardiovascular Disease

## 2020-04-17 DIAGNOSIS — I5032 Chronic diastolic (congestive) heart failure: Secondary | ICD-10-CM

## 2020-04-17 NOTE — Addendum Note (Signed)
Addended by: Geralynn Rile on: 04/17/2020 10:17 AM   Modules accepted: Orders

## 2020-04-17 NOTE — Telephone Encounter (Signed)
Patient returned Julie's call. Transferred patient to [place].

## 2020-04-21 DIAGNOSIS — N1832 Chronic kidney disease, stage 3b: Secondary | ICD-10-CM | POA: Diagnosis not present

## 2020-04-22 ENCOUNTER — Encounter (INDEPENDENT_AMBULATORY_CARE_PROVIDER_SITE_OTHER): Payer: Self-pay | Admitting: Adult Health

## 2020-04-22 ENCOUNTER — Other Ambulatory Visit: Payer: Self-pay

## 2020-04-22 ENCOUNTER — Ambulatory Visit (INDEPENDENT_AMBULATORY_CARE_PROVIDER_SITE_OTHER): Payer: Medicare Other | Admitting: Adult Health

## 2020-04-22 VITALS — BP 111/66 | HR 88 | Temp 98.5°F | Ht 62.0 in | Wt 219.0 lb

## 2020-04-22 DIAGNOSIS — I152 Hypertension secondary to endocrine disorders: Secondary | ICD-10-CM

## 2020-04-22 DIAGNOSIS — E119 Type 2 diabetes mellitus without complications: Secondary | ICD-10-CM | POA: Diagnosis not present

## 2020-04-22 DIAGNOSIS — Z794 Long term (current) use of insulin: Secondary | ICD-10-CM | POA: Diagnosis not present

## 2020-04-22 DIAGNOSIS — Z6841 Body Mass Index (BMI) 40.0 and over, adult: Secondary | ICD-10-CM

## 2020-04-22 DIAGNOSIS — I10 Essential (primary) hypertension: Secondary | ICD-10-CM

## 2020-04-22 DIAGNOSIS — E1159 Type 2 diabetes mellitus with other circulatory complications: Secondary | ICD-10-CM | POA: Diagnosis not present

## 2020-04-22 DIAGNOSIS — N1832 Chronic kidney disease, stage 3b: Secondary | ICD-10-CM

## 2020-04-22 NOTE — Progress Notes (Signed)
Chief Complaint:   OBESITY Regina [name] is here to discuss her progress with her obesity treatment plan along with follow-up of her obesity related diagnoses. Regina [name] is on the Category 2 Plan and states she is following her eating plan approximately 100% of the time. Regina [name] states she is doing water aerobics/walking 10-20 minutes 3 times per week.  Today's visit was #: 3 Starting weight: 224 lbs Starting date: 03/24/2020 Today's weight: 219 lbs Today's date: 04/22/2020 Total lbs lost to date: 5 Total lbs lost since last in-office visit: 1  Interim History: Regina [name] has been following the Category 2 meal plan consistently and denies excessive cravings or polyphagia. She was recently seen by the cardiologist and advised to dramatically decrease her salt intake and eliminate bread from her diet.  Subjective:   Type 2 diabetes mellitus without complication, with long-term current use of insulin (Regina [name]). Regina [name] is on Ozempic 1mg  once weekly and Toujeo 20-30 U daily depending on postprandial reading. Toujeo is written for 35U QD.  Ambulatory fasting blood glucose levels range between 100 and 130 with postprandial blood glucose levels in the range of 120-170. She has been holding Toujeo if her postprandial is less than 120. When she holds Toujeo, following blood glucose levels remain at goal.    Lab Results  Component Value Date   HGBA1C 7.6 (H) 03/24/2020   HGBA1C 6.9 (A) 11/12/2019   HGBA1C 7.8 (H) 09/09/2019   Lab Results  Component Value Date   MICROALBUR 1.9 05/22/2019   LDLCALC 49 03/24/2020   CREATININE 1.68 (H) 03/24/2020   No results found for: INSULIN  Hypertension associated with diabetes (Regina [name]). Blood pressure and heart rate were elevated at initial check this morning. She reports being in a rush this morning to make her appointment. Re-check BP was at goal.  Ambulatory blood pressure readings - systolic blood pressure 235-573 with diastolic blood pressure 22-02. Heart rate  ranges between 80 and 90. She denies cardiac symptoms.  BP Readings from Last 3 Encounters:  04/22/20 111/66  04/14/20 140/64  04/07/20 (!) 151/79   Lab Results  Component Value Date   CREATININE 1.68 (H) 03/24/2020   CREATININE 1.73 (H) 03/12/2020   CREATININE 1.56 (H) 09/09/2019   Stage 3b chronic kidney disease. Lab work from 03/24/2020 showed a creatinine of 1.68 and a GFR of 34. Regina [name] was recently referred to Nephrology.  Assessment/Plan:   Type 2 diabetes mellitus without complication, with long-term current use of insulin (Regina [name]). Good blood sugar control is important to decrease the likelihood of diabetic complications such as nephropathy, neuropathy, limb loss, blindness, coronary artery disease, and death. Intensive lifestyle modification including diet, exercise and weight loss are the first line of treatment for diabetes. Regina [name] will continue close monitoring of her blood glucose levels and will take her blood glucose log to her follow-up with her PCP to discuss medication management.  Last PCP visit was 10/2019, recommended that she f/u in 5-6 months. Recommend that she f/u with PCP this month.  Hypertension associated with diabetes (Regina [name]). Regina [name] is working on healthy weight loss and exercise to improve blood pressure control. We will watch for signs of hypotension as she continues her lifestyle modifications. She will continue her current anti-hypertensive medications and will continue to follow the Category 2 meal plan.  Stage 3b chronic kidney disease. Regina [name] was advised to establish with Nephrology ASAP.  Class 3 severe obesity with serious comorbidity and body mass index (BMI) of 40.0 to 44.9 in adult,  unspecified obesity type (Regina [name]).  Regina [name] is currently in the action stage of change. As such, her goal is to continue with weight loss efforts. She has agreed to the Category 2 Plan (modified to reduce salt/bread per cardiologist recommendation).   Exercise goals: Regina [name] will  continue her current exercise regimen.   Behavioral modification strategies: increasing lean protein intake, increasing vegetables, decreasing sodium intake, meal planning and cooking strategies and planning for success.  Regina [name] has agreed to follow-up with our clinic in 2 weeks. She was informed of the importance of frequent follow-up visits to maximize her success with intensive lifestyle modifications for her multiple health conditions.   Objective:   Blood pressure 111/66, pulse 88, temperature 98.5 F (36.9 C), temperature source Oral, height 5\' 2"  (1.575 m), weight 219 lb (99.3 kg), SpO2 98 %. Body mass index is 40.06 kg/m.  General: Cooperative, alert, well developed, in no acute distress. HEENT: Conjunctivae and lids unremarkable. Cardiovascular: Regular rhythm.  Lungs: Normal work of breathing. Neurologic: No focal deficits.   Lab Results  Component Value Date   CREATININE 1.68 (H) 03/24/2020   BUN 42 (H) 03/24/2020   NA 137 03/24/2020   K 4.5 03/24/2020   CL 95 (L) 03/24/2020   CO2 27 03/24/2020   Lab Results  Component Value Date   ALT 11 03/24/2020   AST 19 03/24/2020   ALKPHOS 85 03/24/2020   BILITOT 0.3 03/24/2020   Lab Results  Component Value Date   HGBA1C 7.6 (H) 03/24/2020   HGBA1C 6.9 (A) 11/12/2019   HGBA1C 7.8 (H) 09/09/2019   HGBA1C 7.7 (H) 05/22/2019   HGBA1C 7.4 (H) 01/16/2015   No results found for: INSULIN Lab Results  Component Value Date   TSH 3.410 03/12/2020   Lab Results  Component Value Date   CHOL 117 03/24/2020   HDL 56 03/24/2020   LDLCALC 49 03/24/2020   TRIG 54 03/24/2020   CHOLHDL 2 05/22/2019   Lab Results  Component Value Date   WBC 8.0 05/22/2019   HGB 11.0 (L) 05/22/2019   HCT 33.2 (L) 05/22/2019   MCV 86.9 05/22/2019   PLT 295.0 05/22/2019   No results found for: IRON, TIBC, FERRITIN  Attestation Statements:   Reviewed by clinician on day of visit: allergies, medications, problem list, medical history,  surgical history, family history, social history, and previous encounter notes.  Time spent on visit including pre-visit chart review and post-visit charting and care was 27 minutes.   I, Michaelene Song, am acting as Location manager for [employer], NP-C   I have reviewed the above documentation for accuracy and completeness, and I agree with the above. -  Esaw Grandchild, NP

## 2020-04-23 LAB — PROTEIN ELECTROPHORESIS, URINE REFLEX
Albumin ELP, Urine: 36 %
Alpha-1-Globulin, U: 2.6 %
Alpha-2-Globulin, U: 9.5 %
Beta Globulin, U: 28.3 %
Gamma Globulin, U: 23.5 %
Protein, Ur: 7.1 mg/dL

## 2020-04-23 LAB — PROTEIN ELECTROPHORESIS, SERUM
A/G Ratio: 1 (ref 0.7–1.7)
Albumin ELP: 3.9 g/dL (ref 2.9–4.4)
Alpha 1: 0.2 g/dL (ref 0.0–0.4)
Alpha 2: 0.8 g/dL (ref 0.4–1.0)
Beta: 1.2 g/dL (ref 0.7–1.3)
Gamma Globulin: 1.6 g/dL (ref 0.4–1.8)
Globulin, Total: 3.9 g/dL (ref 2.2–3.9)
Total Protein: 7.8 g/dL (ref 6.0–8.5)

## 2020-04-24 ENCOUNTER — Inpatient Hospital Stay (HOSPITAL_COMMUNITY)
Admission: EM | Admit: 2020-04-24 | Discharge: 2020-04-26 | DRG: 309 | Disposition: A | Discharge status: Home / Self Care | Payer: Medicare Other | Attending: Cardiovascular Disease | Admitting: Cardiovascular Disease

## 2020-04-24 ENCOUNTER — Emergency Department (HOSPITAL_COMMUNITY): Payer: Medicare Other

## 2020-04-24 ENCOUNTER — Other Ambulatory Visit: Payer: Self-pay

## 2020-04-24 ENCOUNTER — Other Ambulatory Visit (HOSPITAL_COMMUNITY): Payer: Self-pay

## 2020-04-24 ENCOUNTER — Telehealth: Payer: Self-pay | Admitting: Cardiovascular Disease

## 2020-04-24 DIAGNOSIS — I429 Cardiomyopathy, unspecified: Secondary | ICD-10-CM | POA: Diagnosis present

## 2020-04-24 DIAGNOSIS — I130 Hypertensive heart and chronic kidney disease with heart failure and stage 1 through stage 4 chronic kidney disease, or unspecified chronic kidney disease: Secondary | ICD-10-CM | POA: Diagnosis not present

## 2020-04-24 DIAGNOSIS — R531 Weakness: Secondary | ICD-10-CM | POA: Diagnosis not present

## 2020-04-24 DIAGNOSIS — G4730 Sleep apnea, unspecified: Secondary | ICD-10-CM | POA: Diagnosis present

## 2020-04-24 DIAGNOSIS — Z8673 Personal history of transient ischemic attack (TIA), and cerebral infarction without residual deficits: Secondary | ICD-10-CM | POA: Diagnosis not present

## 2020-04-24 DIAGNOSIS — I5032 Chronic diastolic (congestive) heart failure: Secondary | ICD-10-CM | POA: Diagnosis not present

## 2020-04-24 DIAGNOSIS — Z79899 Other long term (current) drug therapy: Secondary | ICD-10-CM

## 2020-04-24 DIAGNOSIS — E1140 Type 2 diabetes mellitus with diabetic neuropathy, unspecified: Secondary | ICD-10-CM

## 2020-04-24 DIAGNOSIS — Z90710 Acquired absence of both cervix and uterus: Secondary | ICD-10-CM | POA: Diagnosis not present

## 2020-04-24 DIAGNOSIS — I4891 Unspecified atrial fibrillation: Secondary | ICD-10-CM | POA: Diagnosis not present

## 2020-04-24 DIAGNOSIS — Z8249 Family history of ischemic heart disease and other diseases of the circulatory system: Secondary | ICD-10-CM

## 2020-04-24 DIAGNOSIS — E785 Hyperlipidemia, unspecified: Secondary | ICD-10-CM | POA: Diagnosis present

## 2020-04-24 DIAGNOSIS — Z7982 Long term (current) use of aspirin: Secondary | ICD-10-CM | POA: Diagnosis not present

## 2020-04-24 DIAGNOSIS — Z794 Long term (current) use of insulin: Secondary | ICD-10-CM

## 2020-04-24 DIAGNOSIS — E1122 Type 2 diabetes mellitus with diabetic chronic kidney disease: Secondary | ICD-10-CM | POA: Diagnosis not present

## 2020-04-24 DIAGNOSIS — Z96611 Presence of right artificial shoulder joint: Secondary | ICD-10-CM | POA: Diagnosis not present

## 2020-04-24 DIAGNOSIS — E859 Amyloidosis, unspecified: Secondary | ICD-10-CM | POA: Diagnosis not present

## 2020-04-24 DIAGNOSIS — I480 Paroxysmal atrial fibrillation: Principal | ICD-10-CM | POA: Diagnosis present

## 2020-04-24 DIAGNOSIS — K219 Gastro-esophageal reflux disease without esophagitis: Secondary | ICD-10-CM | POA: Diagnosis present

## 2020-04-24 DIAGNOSIS — Z79890 Hormone replacement therapy (postmenopausal): Secondary | ICD-10-CM

## 2020-04-24 DIAGNOSIS — E039 Hypothyroidism, unspecified: Secondary | ICD-10-CM | POA: Diagnosis present

## 2020-04-24 DIAGNOSIS — I2510 Atherosclerotic heart disease of native coronary artery without angina pectoris: Secondary | ICD-10-CM | POA: Diagnosis not present

## 2020-04-24 DIAGNOSIS — Z881 Allergy status to other antibiotic agents status: Secondary | ICD-10-CM | POA: Diagnosis not present

## 2020-04-24 DIAGNOSIS — R7989 Other specified abnormal findings of blood chemistry: Secondary | ICD-10-CM | POA: Diagnosis not present

## 2020-04-24 DIAGNOSIS — J9811 Atelectasis: Secondary | ICD-10-CM | POA: Diagnosis not present

## 2020-04-24 DIAGNOSIS — E119 Type 2 diabetes mellitus without complications: Secondary | ICD-10-CM | POA: Diagnosis not present

## 2020-04-24 DIAGNOSIS — I10 Essential (primary) hypertension: Secondary | ICD-10-CM | POA: Diagnosis not present

## 2020-04-24 DIAGNOSIS — N1832 Chronic kidney disease, stage 3b: Secondary | ICD-10-CM | POA: Diagnosis not present

## 2020-04-24 DIAGNOSIS — Z888 Allergy status to other drugs, medicaments and biological substances status: Secondary | ICD-10-CM

## 2020-04-24 DIAGNOSIS — Z885 Allergy status to narcotic agent status: Secondary | ICD-10-CM | POA: Diagnosis not present

## 2020-04-24 DIAGNOSIS — Z6841 Body Mass Index (BMI) 40.0 and over, adult: Secondary | ICD-10-CM | POA: Diagnosis not present

## 2020-04-24 DIAGNOSIS — R0789 Other chest pain: Secondary | ICD-10-CM | POA: Diagnosis not present

## 2020-04-24 LAB — CBC WITH DIFFERENTIAL/PLATELET
Abs Immature Granulocytes: 0.02 10*3/uL (ref 0.00–0.07)
Basophils Absolute: 0 10*3/uL (ref 0.0–0.1)
Basophils Relative: 0 %
Eosinophils Absolute: 0.1 10*3/uL (ref 0.0–0.5)
Eosinophils Relative: 2 %
HCT: 34.8 % — ABNORMAL LOW (ref 36.0–46.0)
Hemoglobin: 11.1 g/dL — ABNORMAL LOW (ref 12.0–15.0)
Immature Granulocytes: 0 %
Lymphocytes Relative: 35 %
Lymphs Abs: 2.5 10*3/uL (ref 0.7–4.0)
MCH: 28.2 pg (ref 26.0–34.0)
MCHC: 31.9 g/dL (ref 30.0–36.0)
MCV: 88.5 fL (ref 80.0–100.0)
Monocytes Absolute: 0.8 10*3/uL (ref 0.1–1.0)
Monocytes Relative: 11 %
Neutro Abs: 3.6 10*3/uL (ref 1.7–7.7)
Neutrophils Relative %: 52 %
Platelets: 283 10*3/uL (ref 150–400)
RBC: 3.93 MIL/uL (ref 3.87–5.11)
RDW: 14.1 % (ref 11.5–15.5)
WBC: 7 10*3/uL (ref 4.0–10.5)
nRBC: 0 % (ref 0.0–0.2)

## 2020-04-24 LAB — BASIC METABOLIC PANEL
Anion gap: 10 (ref 5–15)
BUN: 57 mg/dL — ABNORMAL HIGH (ref 8–23)
CO2: 24 mmol/L (ref 22–32)
Calcium: 9.3 mg/dL (ref 8.9–10.3)
Chloride: 99 mmol/L (ref 98–111)
Creatinine, Ser: 2.08 mg/dL — ABNORMAL HIGH (ref 0.44–1.00)
GFR calc Af Amer: 26 mL/min — ABNORMAL LOW (ref >60)
GFR calc non Af Amer: 23 mL/min — ABNORMAL LOW (ref >60)
Glucose, Bld: 141 mg/dL — ABNORMAL HIGH (ref 70–99)
Potassium: 4.2 mmol/L (ref 3.5–5.1)
Sodium: 133 mmol/L — ABNORMAL LOW (ref 135–145)

## 2020-04-24 LAB — TROPONIN I (HIGH SENSITIVITY)
Troponin I (High Sensitivity): 211 ng/L (ref <18)
Troponin I (High Sensitivity): 179 ng/L (ref <18)

## 2020-04-24 LAB — BRAIN NATRIURETIC PEPTIDE: B Natriuretic Peptide: 374.9 pg/mL — ABNORMAL HIGH (ref 0.0–100.0)

## 2020-04-24 MED ORDER — HEPARIN BOLUS VIA INFUSION
4000.0000 [IU] | Freq: Once | INTRAVENOUS | Status: AC
  Administered 2020-04-24: 4000 [IU] via INTRAVENOUS
  Filled 2020-04-24: qty 4000

## 2020-04-24 MED ORDER — HEPARIN (PORCINE) 25000 UT/250ML-% IV SOLN
950.0000 [IU]/h | INTRAVENOUS | Status: DC
  Administered 2020-04-24: 950 [IU]/h via INTRAVENOUS
  Filled 2020-04-24: qty 250

## 2020-04-24 MED ORDER — SODIUM CHLORIDE 0.9 % IV SOLN: INTRAVENOUS | Status: DC

## 2020-04-24 MED ORDER — DILTIAZEM LOAD VIA INFUSION
10.0000 mg | Freq: Once | INTRAVENOUS | Status: AC
  Administered 2020-04-24: 10 mg via INTRAVENOUS
  Filled 2020-04-24: qty 10

## 2020-04-24 MED ORDER — DILTIAZEM HCL-DEXTROSE 125-5 MG/125ML-% IV SOLN (PREMIX)
5.0000 mg/h | INTRAVENOUS | Status: DC
  Administered 2020-04-24: 5 mg/h via INTRAVENOUS
  Filled 2020-04-24: qty 125

## 2020-04-24 NOTE — Progress Notes (Signed)
ANTICOAGULATION CONSULT NOTE - Initial Consult  Pharmacy Consult for heparin Indication: chest pain/ACS  Allergies  Allergen Reactions  . Ace Inhibitors Other (See Comments)    unknown  . Amlodipine Other (See Comments)    unknown  . Atenolol     bradycardia  . Avandia [Rosiglitazone] Other (See Comments)    unknown  . Darvon [Propoxyphene] Other (See Comments)    unknown  . Erythromycin Itching  . Hydralazine     Burning in throat and chest  . Hydrocodone     No reaction per patient, but ineffective  . Levofloxacin Itching  . Morphine And Related Other (See Comments)    Dizzy and hallucianation, vomiting; Willing to try low dose  . Percocet [Oxycodone-Acetaminophen]     hallucination  . Spironolactone Other (See Comments)    unknown  . Tramadol     Unknown/does not recall reaction but does not want to take again    Patient Measurements:   Heparin Dosing Weight: 74 kg   Vital Signs: Temp: 98.5 F (36.9 C) (07/09 1831) Temp Source: Oral (07/09 1831) BP: 100/76 (07/09 2030) Pulse Rate: 77 (07/09 2030)  Labs: Recent Labs    04/24/20 1857  HGB 11.1*  HCT 34.8*  PLT 283  CREATININE 2.08*  TROPONINIHS 211*    Estimated Creatinine Clearance: 25.4 mL/min (A) (by C-G formula based on SCr of 2.08 mg/dL (H)).   Medical History: Past Medical History:  Diagnosis Date  . Back pain   . CHF (congestive heart failure) ([place])   . Diabetes mellitus without complication ([place])   . Dyspnea   . GERD (gastroesophageal reflux disease)   . H/O echocardiogram    a. 01/2015 Echo: EF 55-60%, Gr 2 DD, mod LVH, mildly dil LA.  Marland Kitchen Heart disease   . Hypertension   . Hypothyroidism   . Joint pain   . Kidney problem   . Lower extremity edema   . Mini stroke ([place])   . Non-obstructive CAD    a. 01/2015 Cardiolite: + inf wall ischemia, EF 56%;  b. 01/2015 Cath: LM nl, LAD 12m, LCX min irregs, RCA dominant, 50-52m.  . Sleep apnea   . Swallowing difficulty   . Thyroid disease      Medications:  (Not in a hospital admission)   Assessment: 76 YOF with chest pain and elevated troponin to start IV heparin for ACS. H/H low, Plt wnl.   Goal of Therapy:  Heparin level 0.3-0.7 units/ml Monitor platelets by anticoagulation protocol: Yes   Plan:  -Heparin 4000 units IV bolus followed by heparin infusion at 950 units/hr -F/u 8 hr HL -Monitor daily HL, CBC and s/s of bleeding  Albertina Parr, PharmD., BCPS, BCCCP Clinical Pharmacist Clinical phone for 04/24/20 until 11:30pm: [phone] If after 11:30pm, please refer to [facility] for unit-specific pharmacist

## 2020-04-24 NOTE — Telephone Encounter (Signed)
Patient returning call.

## 2020-04-24 NOTE — H&P (Signed)
 Cardiology History & Physical    Patient ID: Regina [name] MRN: [id number], DOB/AGE: 06/05/1941   Admit date: 04/24/2020  Primary Physician: Jordan, [name], MD Primary Cardiologist: No primary care provider on file.  Patient Profile    79-year-old female with a history of nonobstructive coronary artery disease, heart failure with preserved ejection fraction, diabetes, hyperlipidemia, hypertension who presents with atrial fibrillation with rapid ventricular response, shortness of breath, and chest pressure.  History of Present Illness    Regina [name] was in her normal state of health until she noticed that she developed a rapid heartbeat around 9:00 PM yesterday evening.  She is not exactly sure when this started, but did she notice some palpitations a few days ago.  With worsening tachycardia she has noted some palpitations and central chest pressure associated with her regular, rapid heartbeat.  She denies any severe chest pain and the central chest pressure has improved with ventricular rate control.  No nausea or vomiting.  She has also felt more lethargic and short of breath for the last couple of days.  She was started on a diltiazem infusion in the ED with prompt control of her ventricular rate.  Review of prior notes she is seen by Dr. O'Neal recently.  She had a recent echocardiogram with grade 2 diastolic dysfunction and elevation of BNP consistent with HFpEF.  She was started on 20 mg of daily furosemide with improvement in her dyspnea.  She completed a pyrophosphate nuclear scan on 1 July which was suggestive of ATTR amyloidosis.  No M spike on SPEP or UPEP.  No free light chains available.  Cardiovascular meds include: Aspirin 81 mg daily Atorvastatin 20 mg daily Furosemide 20 mg daily Losartan 50 mg daily  Past Medical History   Past Medical History:  Diagnosis Date  . Back pain   . CHF (congestive heart failure) ([facility])   . Diabetes mellitus without complication  ([facility])   . Dyspnea   . GERD (gastroesophageal reflux disease)   . H/O echocardiogram    a. 01/2015 Echo: EF 55-60%, Gr 2 DD, mod LVH, mildly dil LA.  . Heart disease   . Hypertension   . Hypothyroidism   . Joint pain   . Kidney problem   . Lower extremity edema   . Mini stroke ([facility])   . Non-obstructive CAD    a. 01/2015 Cardiolite: + inf wall ischemia, EF 56%;  b. 01/2015 Cath: LM nl, LAD 50m, LCX min irregs, RCA dominant, 50-60m.  . Sleep apnea   . Swallowing difficulty   . Thyroid disease     Past Surgical History:  Procedure Laterality Date  . ABDOMINAL HYSTERECTOMY    . CARDIAC CATHETERIZATION    . ESOPHAGOGASTRODUODENOSCOPY     a. 01/2015 EGD: patent esophagus.  . ESOPHAGOGASTRODUODENOSCOPY N/A 01/20/2015   Procedure: ESOPHAGOGASTRODUODENOSCOPY (EGD);  Surgeon: Patrick Hung, MD;  Location: [facility] ENDOSCOPY;  Service: Endoscopy;  Laterality: N/A;  . HAND SURGERY    . JOINT REPLACEMENT     reports history bilateral TKA and right TSA  . LEFT HEART CATHETERIZATION WITH CORONARY ANGIOGRAM N/A 01/19/2015  . THYROIDECTOMY       Allergies Allergies  Allergen Reactions  . Ace Inhibitors Other (See Comments)    unknown  . Amlodipine Other (See Comments)    unknown  . Atenolol     bradycardia  . Avandia [Rosiglitazone] Other (See Comments)    unknown  . Darvon [Propoxyphene] Other (See Comments)    unknown  .   Erythromycin Itching  . Hydralazine     Burning in throat and chest  . Hydrocodone     No reaction per patient, but ineffective  . Levofloxacin Itching  . Morphine And Related Other (See Comments)    Dizzy and hallucianation, vomiting; Willing to try low dose  . Percocet [Oxycodone-Acetaminophen]     hallucination  . Spironolactone Other (See Comments)    unknown  . Tramadol     Unknown/does not recall reaction but does not want to take again    Home Medications    Prior to Admission medications   Medication Sig Start Date End Date Taking? Authorizing Provider    allopurinol (ZYLOPRIM) 100 MG tablet Take 1 tablet (100 mg total) by mouth daily. 01/31/20  Yes [name], [name], MD  Ascorbic Acid (VITA-C PO) Take 1 tablet by mouth daily.    Yes [provider]  aspirin 81 MG tablet Take 81 mg by mouth daily.   Yes [provider]  atorvastatin (LIPITOR) 20 MG tablet Take 1 tablet (20 mg total) by mouth daily. 11/13/19  Yes [name], [name], MD  clotrimazole-betamethasone (LOTRISONE) cream Apply 1 application topically 2 (two) times daily as needed. Patient taking differently: Apply 1 application topically 2 (two) times daily as needed (rash).  09/09/19  Yes [name], [name], MD  cyclobenzaprine (FLEXERIL) 5 MG tablet Take 1 tablet (5 mg total) by mouth daily as needed. Patient taking differently: Take 5 mg by mouth daily as needed for muscle spasms.  04/14/20  Yes [name], [name], MD  fluticasone [facility]) 50 MCG/ACT nasal spray Place 1 spray into both nostrils daily as needed for allergies or rhinitis.   Yes [provider]  furosemide (LASIX) 20 MG tablet Take 1 tablet (20 mg total) by mouth daily. 03/12/20  Yes O'Neal, Cassie Freer, MD  gabapentin (NEURONTIN) 600 MG tablet Take 1 tablet by mouth twice daily 03/17/20  Yes [name], [name], MD  Insulin Glargine, 1 Unit Dial, (TOUJEO SOLOSTAR) 300 UNIT/ML SOPN Inject 35 Units into the skin daily. Patient taking differently: Inject 30 Units into the skin daily.  11/01/19  Yes [name], [name], MD  Iron-Vitamins (GERITOL COMPLETE PO) Take 1 tablet by mouth daily.   Yes [provider]  lactulose (CHRONULAC) 10 GM/15ML solution Take 20 g by mouth daily as needed for mild constipation.    Yes [provider]  levothyroxine (SYNTHROID) 150 MCG tablet Take 150 mcg by mouth daily. 12/18/18  Yes [provider]  loratadine (CLARITIN) 10 MG tablet Take 1 tablet (10 mg total) by mouth daily as needed. Patient taking differently: Take 10 mg by mouth daily as needed for allergies.   10/23/19  Yes [name], [name], MD  losartan (COZAAR) 50 MG tablet Take 1 tablet (50 mg total) by mouth daily. 04/14/20 07/13/20 Yes O'Neal, Cassie Freer, MD  magnesium oxide (MAG-OX) 400 (241.3 Mg) MG tablet Take 1 tablet by mouth once daily 04/14/20  Yes [name], [name], MD  meclizine (ANTIVERT) 25 MG tablet Take 25 mg by mouth daily as needed for dizziness.  10/01/14  Yes [provider]  Omega-3 Fatty Acids (FISH OIL) 1000 MG CAPS Take 1,000 mg by mouth daily.    Yes [provider]  omeprazole (PRILOSEC) 40 MG capsule Take 1 capsule (40 mg total) by mouth daily. 11/12/19  Yes [name], [name], MD  OZEMPIC, 1 MG/DOSE, 2 MG/1.5ML SOPN Inject 0.75 mLs (1 mg total) into the skin once a week.  04/07/20  Yes Beasley, Caren D, MD  Propylene Glycol (SYSTANE BALANCE OP) Place 1 drop into both eyes daily as needed (for dry eyes).   Yes [provider]  terazosin (HYTRIN) 5 MG capsule Take 1 capsule (5 mg total) by mouth daily. 01/14/20  Yes [name], [name], MD  VITAMIN D PO Take 1 tablet by mouth daily.    Yes [provider]  Alcohol Swabs (B-D SINGLE USE SWABS REGULAR) PADS Use to test blood sugar up to 3 times daily 06/10/19   [name], [name], MD  Blood Glucose Monitoring Suppl (ACCU-CHEK GUIDE) w/Device KIT 1 Device by Does not apply route 3 (three) times daily. 06/10/19   [name], [name], MD  glucose blood (ACCU-CHEK GUIDE) test strip Use to test blood sugar up to 3 times daily 03/13/20   [name], [name], MD  Lancets Misc. (ACCU-CHEK FASTCLIX LANCET) KIT Use to test blood sugar up to 3 times daily 03/13/20   [name], [name], MD  magnesium oxide (MAG-OX) 400 MG tablet Take 1 tablet by mouth once daily Patient not taking: Reported on 04/24/2020 01/22/20   [name], [name], MD    Family History    Family History  Problem Relation Age of Onset  . Heart disease Mother   . Hypertension Mother   . Cancer Father   . Alcoholism Father    She indicated that the status of her mother  is unknown. She indicated that the status of her father is unknown.   Social History    Social History   Socioeconomic History  . Marital status: Widowed    Spouse name: Not on file  . Number of children: Not on file  . Years of education: Not on file  . Highest education level: Not on file  Occupational History  . Occupation: Retired  Tobacco Use  . Smoking status: Never Smoker  . Smokeless tobacco: Never Used  Substance and Sexual Activity  . Alcohol use: No  . Drug use: Not on file  . Sexual activity: Not on file  Other Topics Concern  . Not on file  Social History Narrative  . Not on file   Social Determinants of Health   Financial Resource Strain:   . Difficulty of Paying Living Expenses:   Food Insecurity:   . Worried About Charity fundraiser in the Last Year:   . Arboriculturist in the Last Year:   Transportation Needs:   . Film/video editor (Medical):   Marland Kitchen Lack of Transportation (Non-Medical):   Physical Activity:   . Days of Exercise per Week:   . Minutes of Exercise per Session:   Stress:   . Feeling of Stress :   Social Connections:   . Frequency of Communication with Friends and Family:   . Frequency of Social Gatherings with Friends and Family:   . Attends Religious Services:   . Active Member of Clubs or Organizations:   . Attends Archivist Meetings:   Marland Kitchen Marital Status:   Intimate Partner Violence:   . Fear of Current or Ex-Partner:   . Emotionally Abused:   Marland Kitchen Physically Abused:   . Sexually Abused:      Review of Systems    General:  No chills, fever, night sweats or weight changes.  Cardiovascular:  No chest pain, dyspnea on exertion, edema, orthopnea, palpitations, paroxysmal nocturnal dyspnea. Dermatological: No rash, lesions/masses Respiratory: No cough, dyspnea Urologic: No hematuria, dysuria Abdominal:   No  nausea, vomiting, diarrhea, bright red blood per rectum, melena, or hematemesis Neurologic:  No visual changes,  wkns, changes in mental status. All other systems reviewed and are otherwise negative except as noted above.  Physical Exam    BP 95/70   Pulse 93   Temp 98.5 F (36.9 C) (Oral)   Resp 17   SpO2 99%  General: Alert, NAD HEENT: Normal  Neck: No bruit, JVP 8 cm H20 w/ + AJR Lungs:  Resp regular and unlabored, CTA bilaterally. Heart: Irregularly irregular rhythm, no s3, s4, or murmurs. Abdomen: Soft, non-tender, non-distended, BS +.  Extremities: Tepid. No clubbing, cyanosis or edema. DP/PT/Radials 2+ and equal bilaterally. Psych: Normal affect. Neuro: Alert and oriented. No gross focal deficits. No abnormal movements.  Labs    Reviewed.  Significant for following: GFR 26 from 34 Mild hyponatremia sodium 133 Magnesium pending Troponin 211, 179 with BNP 375.  Prior BNP 312 in May of this year.   Radiology Studies    Chest x-ray today without significant vascular congestion or acute cardiopulmonary process.  Personally reviewed.  ECG & Cardiac Imaging    Transthoracic echo, April 03, 2020, personally reviewed.  Moderate concentric LVH with grade 2 diastolic dysfunction.  Preserved LVEF.  Normal RV function.  Mild MR.  Pyrophosphate nuclear imaging study April 16, 2020 Heart to contralateral ratio of 1.3 with visual score of 3 suggestive of transthyretin amyloidosis.  Assessment & Plan   79-year-old female with history of hypertension, hyperlipidemia, type 2 diabetes, heart failure with preserved ejection fraction, and nonobstructive coronary artery disease presenting with new onset atrial fibrillation with rapid ventricular response.  She was easily rate controlled in the emergency department on diltiazem infusion.  Of note, she had recently positive pyrophosphate study suggestive of DTR amyloidosis.  Overall her clinical syndrome of elderly black female with worsening diastolic heart failure, typical echocardiographic features, and new onset atrial arrhythmia are consistent with  amyloidosis.  Alternatively, she is obese, diabetic, and hypertensive with CKD and this represents HFpEF with associated atrial fibrillation.  Suspect she will need from rhythm control strategy.  For now, continue rate control strategy and will transition off of diltiazem infusion to oral diltiazem dosing.  We will continue heparin infusion now prior to transition to DOAC.  I suspect that her troponin elevation is due to infiltrative cardiomyopathy and uncontrolled ventricular rates, but will hold off on DOAC admitting service size she is appropriate for invasive angiography given history of nonobstructive coronary disease on left heart catheterization in 2016.  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  Titrate as necessary. - Continue heparin infusion.  Transition to NOAC prior to discharge. - Plan for TEE cardioversion if she does not spontaneously convert. - Given GFR, infiltrative cardiomyopathy, history of CAD, only advance antiarrhythmic that would be reasonable for her is likely amiodarone.  Defer decision regarding this overnight.  Troponin elevation -Suspect supply demand mismatch with underlying infiltrative cardiomyopathy  HFpEF, suspected ATTR amyloidosis -Modestly hyprevolemic by exam; 60 mg IV furosemide in AM, reevaluate, and resume home diuretic (20 mg furosemide daily)  Hypertension -Hold losartan; reintroduce if she tolerates required diltiazem.  Diabetes - Home regimen insulin glargine 35 units daily - NPH 5   units twice daily +5 units short acting mealtime plus sliding scale  Hypothyroidism Continue home Synthroid  150 mcg grams daily Check TSH   Nutrition: Regular diet  DVT ppx: Heparin infusion Advanced Care Planning: Full code   Signed, Carriel T Nipp, MD 04/24/2020, 10:15 PM    

## 2020-04-24 NOTE — ED Triage Notes (Signed)
Pt endorses palpitations causing her to feel lightheaded since last night. Endorses slight chest pressure.

## 2020-04-24 NOTE — Telephone Encounter (Signed)
Left message to call back  

## 2020-04-24 NOTE — Telephone Encounter (Signed)
STAT if HR is under 50 or over 120 (normal HR is 60-100 beats per minute)  1) What is your heart rate?  Patient checked it 3 times, kept getting an error message  2) Do you have a log of your heart rate readings (document readings)?  7/8 530pm 94 7/8 7pm 95  7/8 707pm 114 7/8 930pm 139 7/8 1030pm 141 7/9 1223am 129 7/9 630am 141 7/9 748am 80 7/9 125pm 92   3) Do you have any other symptoms?  Feeling staggery and very weak.      Pt c/o of Chest Pain: STAT if CP now or developed within 24 hours  1. Are you having CP right now? Not having the pressure right now  2. Are you experiencing any other symptoms (ex. SOB, nausea, vomiting, sweating)? Only weak and very much not like herself.   3. How long have you been experiencing CP? Only episode was lastnight. She stated there was a little pressure, not pain, in her chest lastnight while she was laying down, she said the pain wasn't real bad but it was noticeable.   4. Is your CP continuous or coming and going? It was continuous. She said it felt like someone was pressing in her chest with their fingers.   5. Have you taken Nitroglycerin? No, doesn't have any. She said she took a tums.       Pt c/o BP issue: STAT if pt c/o blurred vision, one-sided weakness or slurred speech  1. What are your last 5 BP readings?  7/8 530pm 181/65 7/8 7pm 125/103 7/8 707pm 122/102 7/8 930pm 114/79 7/8 1030pm 123/79 7/9 1223am 101/73 7/9 748am 115/86 7/9 125pm 112/90 2. Are you having any other symptoms (ex. Dizziness, headache, blurred vision, passed out)? Patient continued to state her only complaint is feeling week, and staggery and not like her self.   3. What is your BP issue? It is high

## 2020-04-24 NOTE — ED Provider Notes (Signed)
[facility] EMERGENCY DEPARTMENT Provider Note   CSN: [id number] Arrival date & time: 04/24/20  1825     History Chief Complaint  Patient presents with  . Palpitations  . Dizziness    Regina [name] is a 79 y.o. female.  79 year old female presents here with atrial fibrillation with rapid ventricular rate response.  Patient states that she is unsure of when her irregular heartbeat began but really noticed it last night around 9:00.  She notes some pressure associated with the palpitations.  Denies any severe pain, diaphoresis.  No nausea or vomiting.  Does note some increased lower extremity edema does have remote history of CHF.  Symptoms have been persistent nothing makes them better worse no treatment use prior to arrival.        Past Medical History:  Diagnosis Date  . Back pain   . CHF (congestive heart failure) ([place])   . Diabetes mellitus without complication ([place])   . Dyspnea   . GERD (gastroesophageal reflux disease)   . H/O echocardiogram    a. 01/2015 Echo: EF 55-60%, Gr 2 DD, mod LVH, mildly dil LA.  Marland Kitchen Heart disease   . Hypertension   . Hypothyroidism   . Joint pain   . Kidney problem   . Lower extremity edema   . Mini stroke ([place])   . Non-obstructive CAD    a. 01/2015 Cardiolite: + inf wall ischemia, EF 56%;  b. 01/2015 Cath: LM nl, LAD 19m LCX min irregs, RCA dominant, 50-618m . Sleep apnea   . Swallowing difficulty   . Thyroid disease     Patient Active Problem List   Diagnosis Date Noted  . Other fatigue 03/24/2020  . Short of breath on exertion 03/24/2020  . Congestive heart failure (HCGib[place]06/05/2020  . Vitamin D deficiency 03/24/2020  . Sleep apnea 05/21/2019  . History of hepatitis C 05/21/2019  . Insomnia 05/21/2019  . CKD (chronic kidney disease), stage III 05/21/2019  . GERD (gastroesophageal reflux disease) 04/20/2018  . Type 2 diabetes mellitus with diabetic neuropathy, unspecified (HC[facility] [place]03/02/2017  . History of TIA  (transient ischemic attack) 12/19/2016  . Hyperlipidemia associated with type 2 diabetes mellitus (HCG[place]vel[place]03/02/2017  . S/P total knee replacement using cement, right 03/18/2015  . Non-obstructive CAD   . Cardiovascular stress test abnormal   . Pleuritic chest pain 01/16/2015  . Acute renal failure superimposed on stage 3 chronic kidney disease (HCMo[facility] [place]04/10/2014  . Obesity, Class III, BMI 40-49.9 (morbid obesity) (HC[facility] [place]04/10/2014  . Hypertension associated with diabetes (HC[facility]04/10/2014  . Hypothyroidism 01/16/2015  . OSA on CPAP 01/16/2015  . DM type 2 (diabetes mellitus, type 2) (HCL[facility] O' Lakes04/10/2014  . Coronary artery disease involving native coronary artery without angina pectoris 10/02/2014  . Physical deconditioning 02/28/2013  . Class 3 severe obesity with serious comorbidity and body mass index (BMI) of 40.0 to 44.9 in adult (HCRadium05/14/2014  . Osteoarthritis 02/27/2013    Past Surgical History:  Procedure Laterality Date  . ABDOMINAL HYSTERECTOMY    . CARDIAC CATHETERIZATION    . ESOPHAGOGASTRODUODENOSCOPY     a. 01/2015 EGD: patent esophagus.  . ESOPHAGOGASTRODUODENOSCOPY N/A 01/20/2015   Procedure: ESOPHAGOGASTRODUODENOSCOPY (EGD);  Surgeon: PaCarol AdaMD;  [facility];  Service: Endoscopy;  Laterality: N/A;  . HAND SURGERY    . JOINT REPLACEMENT     reports history bilateral TKA and right TSA  . LEFT HEART CATHETERIZATION WITH CORONARY ANGIOGRAM N/A 01/19/2015  . THYROIDECTOMY  OB History    Gravida  6   Para      Term      Preterm      AB      Living        SAB      TAB      Ectopic      Multiple      Live Births              Family History  Problem Relation Age of Onset  . Heart disease Mother   . Hypertension Mother   . Cancer Father   . Alcoholism Father     Social History   Tobacco Use  . Smoking status: Never Smoker  . Smokeless tobacco: Never Used  Substance Use Topics  . Alcohol use: No  . Drug use: Not on file     Home Medications Prior to Admission medications   Medication Sig Start Date End Date Taking? Authorizing Provider  Alcohol Swabs (B-D SINGLE USE SWABS REGULAR) PADS Use to test blood sugar up to 3 times daily 06/10/19   [name], [name], MD  allopurinol (ZYLOPRIM) 100 MG tablet Take 1 tablet (100 mg total) by mouth daily. 01/31/20   [name], [name], MD  Ascorbic Acid (VITA-C PO) Take by mouth daily.    [provider]  aspirin 81 MG tablet Take 81 mg by mouth daily.    [provider]  atorvastatin (LIPITOR) 20 MG tablet Take 1 tablet (20 mg total) by mouth daily. 11/13/19   [name], [name], MD  Blood Glucose Monitoring Suppl (ACCU-CHEK GUIDE) w/Device KIT 1 Device by Does not apply route 3 (three) times daily. 06/10/19   [name], [name], MD  clotrimazole-betamethasone (LOTRISONE) cream Apply 1 application topically 2 (two) times daily as needed. 09/09/19   [name], [name], MD  cyclobenzaprine (FLEXERIL) 5 MG tablet Take 1 tablet (5 mg total) by mouth daily as needed. 04/14/20   [name], [name], MD  furosemide (LASIX) 20 MG tablet Take 1 tablet (20 mg total) by mouth daily. 03/12/20   Geralynn Rile, MD  gabapentin (NEURONTIN) 600 MG tablet Take 1 tablet by mouth twice daily 03/17/20   [name], [name], MD  glucose blood (ACCU-CHEK GUIDE) test strip Use to test blood sugar up to 3 times daily 03/13/20   [name], [name], MD  Insulin Glargine, 1 Unit Dial, (TOUJEO SOLOSTAR) 300 UNIT/ML SOPN Inject 35 Units into the skin daily. 11/01/19   [name], [name], MD  Iron-Vitamins (GERITOL COMPLETE PO) Take 1 tablet by mouth daily.    [provider]  lactulose (CHRONULAC) 10 GM/15ML solution Take 20 g by mouth daily.    [provider]  lactulose (CHRONULAC) 10 GM/15ML solution Take 15 mLs by mouth daily as needed for constipation. 05/18/18   [provider]  Lancets Misc. (ACCU-CHEK FASTCLIX LANCET) KIT Use to test blood sugar up to 3 times daily 03/13/20   [name],  [name], MD  levothyroxine (SYNTHROID) 150 MCG tablet Take 150 mcg by mouth daily. 12/18/18   [provider]  loratadine (CLARITIN) 10 MG tablet Take 1 tablet (10 mg total) by mouth daily as needed. 10/23/19   [name], [name], MD  losartan (COZAAR) 50 MG tablet Take 1 tablet (50 mg total) by mouth daily. 04/14/20 07/13/20  Geralynn Rile, MD  magnesium oxide (MAG-OX) 400 (241.3 Mg) MG tablet Take 1 tablet by mouth once daily 04/14/20   [name], Inez Catalina  G, MD  magnesium oxide (MAG-OX) 400 MG tablet Take 1 tablet by mouth once daily 01/22/20   [name], [name], MD  meclizine (ANTIVERT) 25 MG tablet Take 25 mg by mouth daily as needed. 10/01/14   [provider]  Omega-3 Fatty Acids (FISH OIL) 1000 MG CAPS Take by mouth daily.    [provider]  omeprazole (PRILOSEC) 40 MG capsule Take 1 capsule (40 mg total) by mouth daily. 11/12/19   [name], [name], MD  OZEMPIC, 1 MG/DOSE, 2 MG/1.5ML SOPN Inject 0.75 mLs (1 mg total) into the skin once a week. 04/07/20   Dennard Nip D, MD  terazosin (HYTRIN) 5 MG capsule Take 1 capsule (5 mg total) by mouth daily. 01/14/20   [name], [name], MD  VITAMIN D PO Take by mouth daily.    [provider]    Allergies    Ace inhibitors, Amlodipine, Atenolol, Avandia [rosiglitazone], Darvon [propoxyphene], Erythromycin, Hydralazine, Hydrocodone, Levofloxacin, Morphine and related, Percocet [oxycodone-acetaminophen], Spironolactone, and Tramadol  Review of Systems   Review of Systems  All other systems reviewed and are negative.   Physical Exam Updated Vital Signs BP 132/78 (BP Location: Left Arm)   Pulse (!) 158   Temp 98.5 F (36.9 C) (Oral)   Resp 16   SpO2 99%   Physical Exam Vitals and nursing note reviewed.  Constitutional:      General: She is not in acute distress.    Appearance: Normal appearance. She is well-developed. She is not toxic-appearing.  HENT:     Head: Normocephalic and atraumatic.  Eyes:     General:  Lids are normal.     Conjunctiva/sclera: Conjunctivae normal.     Pupils: Pupils are equal, round, and reactive to light.  Neck:     Thyroid: No thyroid mass.     Trachea: No tracheal deviation.  Cardiovascular:     Rate and Rhythm: Tachycardia present. Rhythm regularly irregular.     Heart sounds: Normal heart sounds. No murmur heard.  No gallop.   Pulmonary:     Effort: Pulmonary effort is normal. No respiratory distress.     Breath sounds: Normal breath sounds. No stridor. No decreased breath sounds, wheezing, rhonchi or rales.  Abdominal:     General: Bowel sounds are normal. There is no distension.     Palpations: Abdomen is soft.     Tenderness: There is no abdominal tenderness. There is no rebound.  Musculoskeletal:        General: No tenderness. Normal range of motion.     Cervical back: Normal range of motion and neck supple.  Lymphadenopathy:     Comments: 2+ bilateral lower extremity pitting edema  Skin:    General: Skin is warm and dry.     Findings: No abrasion or rash.  Neurological:     Mental Status: She is alert and oriented to person, place, and time.     GCS: GCS eye subscore is 4. GCS verbal subscore is 5. GCS motor subscore is 6.     Cranial Nerves: No cranial nerve deficit.     Sensory: No sensory deficit.  Psychiatric:        Speech: Speech normal.        Behavior: Behavior normal.     ED Results / Procedures / Treatments   Labs (all labs ordered are listed, but only abnormal results are displayed) Labs Reviewed  CBC WITH DIFFERENTIAL/PLATELET  BASIC METABOLIC PANEL  BRAIN NATRIURETIC PEPTIDE  TROPONIN I (  HIGH SENSITIVITY)    EKG None   ED ECG REPORT   Date: 04/24/2020  Rate: 143  Rhythm: atrial fibrillation  QRS Axis: normal  Intervals: normal  ST/T Wave abnormalities: nonspecific ST changes  Conduction Disutrbances:none  Narrative Interpretation:   Old EKG Reviewed: none available  I have personally reviewed the EKG tracing and  agree with the computerized printout as noted.  Radiology No results found.  Procedures Procedures (including critical care time)  Medications Ordered in ED Medications  0.9 %  sodium chloride infusion (has no administration in time range)  diltiazem (CARDIZEM) 1 mg/mL load via infusion 10 mg (has no administration in time range)    And  diltiazem (CARDIZEM) 125 mg in dextrose 5% 125 mL (1 mg/mL) infusion (has no administration in time range)    ED Course  I have reviewed the triage vital signs and the nursing notes.  Pertinent labs & imaging results that were available during my care of the patient were reviewed by me and considered in my medical decision making (see chart for details).    MDM Rules/Calculators/A&P                         Patient given Cardizem bolus and started on Cardizem drip.  Rate became more controlled.  Still remains in A. fib.  Unclear when patient symptoms started therefore will not cardiovert.  She also has elevated troponin but it is decreasing but still remains significantly elevated.  Have heparinized per pharmacy for ACS.  Will consult cardiology  CRITICAL CARE Performed by: Leota Jacobsen Total critical care time: 60 minutes Critical care time was exclusive of separately billable procedures and treating other patients. Critical care was necessary to treat or prevent imminent or life-threatening deterioration. Critical care was time spent personally by me on the following activities: development of treatment plan with patient and/or surrogate as well as nursing, discussions with consultants, evaluation of patient's response to treatment, examination of patient, obtaining history from patient or surrogate, ordering and performing treatments and interventions, ordering and review of laboratory studies, ordering and review of radiographic studies, pulse oximetry and re-evaluation of patient's condition.   Final Clinical Impression(s) / ED  Diagnoses Final diagnoses:  None    Rx / DC Orders ED Discharge Orders    None       Lacretia Leigh, MD 04/24/20 2158

## 2020-04-24 NOTE — Telephone Encounter (Signed)
Spoke with pt, she reports last night her heart rate elevated and has been running high today. She also reports last night after lying down she developed a pressure across her chest, it was not there when she woke but has come back this afternoon. Her bp currently is 112/92 with pulse 92. She can feels her pulse in her arm and reports it is skipping. Advised patient to go to the ER for evaluation. Patient voiced understanding

## 2020-04-25 DIAGNOSIS — I4891 Unspecified atrial fibrillation: Secondary | ICD-10-CM

## 2020-04-25 LAB — HEPATIC FUNCTION PANEL
ALT: 18 U/L (ref 0–44)
AST: 24 U/L (ref 15–41)
Albumin: 3.6 g/dL (ref 3.5–5.0)
Alkaline Phosphatase: 51 U/L (ref 38–126)
Bilirubin, Direct: <0.1 mg/dL (ref 0.0–0.2)
Total Bilirubin: 0.7 mg/dL (ref 0.3–1.2)
Total Protein: 7.7 g/dL (ref 6.5–8.1)

## 2020-04-25 LAB — CBC
HCT: 33.6 % — ABNORMAL LOW (ref 36.0–46.0)
Hemoglobin: 10.8 g/dL — ABNORMAL LOW (ref 12.0–15.0)
MCH: 28.3 pg (ref 26.0–34.0)
MCHC: 32.1 g/dL (ref 30.0–36.0)
MCV: 88 fL (ref 80.0–100.0)
Platelets: 289 10*3/uL (ref 150–400)
RBC: 3.82 MIL/uL — ABNORMAL LOW (ref 3.87–5.11)
RDW: 14.4 % (ref 11.5–15.5)
WBC: 6.2 10*3/uL (ref 4.0–10.5)
nRBC: 0 % (ref 0.0–0.2)

## 2020-04-25 LAB — MAGNESIUM
Magnesium: 2 mg/dL (ref 1.7–2.4)
Magnesium: 1.9 mg/dL (ref 1.7–2.4)

## 2020-04-25 LAB — BASIC METABOLIC PANEL
Anion gap: 12 (ref 5–15)
BUN: 51 mg/dL — ABNORMAL HIGH (ref 8–23)
CO2: 24 mmol/L (ref 22–32)
Calcium: 9.3 mg/dL (ref 8.9–10.3)
Chloride: 101 mmol/L (ref 98–111)
Creatinine, Ser: 1.92 mg/dL — ABNORMAL HIGH (ref 0.44–1.00)
GFR calc Af Amer: 29 mL/min — ABNORMAL LOW (ref >60)
GFR calc non Af Amer: 25 mL/min — ABNORMAL LOW (ref >60)
Glucose, Bld: 106 mg/dL — ABNORMAL HIGH (ref 70–99)
Potassium: 3.9 mmol/L (ref 3.5–5.1)
Sodium: 137 mmol/L (ref 135–145)

## 2020-04-25 LAB — GLUCOSE, CAPILLARY
Glucose-Capillary: 97 mg/dL (ref 70–99)
Glucose-Capillary: 128 mg/dL — ABNORMAL HIGH (ref 70–99)
Glucose-Capillary: 136 mg/dL — ABNORMAL HIGH (ref 70–99)
Glucose-Capillary: 130 mg/dL — ABNORMAL HIGH (ref 70–99)

## 2020-04-25 LAB — TSH: TSH: 5.783 u[IU]/mL — ABNORMAL HIGH (ref 0.350–4.500)

## 2020-04-25 LAB — CBG MONITORING, ED: Glucose-Capillary: 139 mg/dL — ABNORMAL HIGH (ref 70–99)

## 2020-04-25 LAB — HEPARIN LEVEL (UNFRACTIONATED): Heparin Unfractionated: 0.49 IU/mL (ref 0.30–0.70)

## 2020-04-25 MED ORDER — LORATADINE 10 MG PO TABS: 10.0000 mg | ORAL_TABLET | Freq: Every day | ORAL | Status: DC | PRN

## 2020-04-25 MED ORDER — FUROSEMIDE 10 MG/ML IJ SOLN
60.0000 mg | Freq: Once | INTRAMUSCULAR | Status: AC
  Administered 2020-04-25: 60 mg via INTRAVENOUS
  Filled 2020-04-25: qty 6

## 2020-04-25 MED ORDER — INSULIN ASPART 100 UNIT/ML ~~LOC~~ SOLN
0.0000 [IU] | Freq: Three times a day (TID) | SUBCUTANEOUS | Status: DC
  Administered 2020-04-25: 3 [IU] via SUBCUTANEOUS

## 2020-04-25 MED ORDER — LEVOTHYROXINE SODIUM 75 MCG PO TABS
150.0000 ug | ORAL_TABLET | Freq: Every day | ORAL | Status: DC
  Administered 2020-04-25 – 2020-04-26 (×2): 150 ug via ORAL
  Filled 2020-04-25 (×2): qty 2

## 2020-04-25 MED ORDER — TERAZOSIN HCL 5 MG PO CAPS
5.0000 mg | ORAL_CAPSULE | Freq: Every day | ORAL | Status: DC
  Administered 2020-04-25: 5 mg via ORAL
  Filled 2020-04-25 (×2): qty 1

## 2020-04-25 MED ORDER — DILTIAZEM HCL 60 MG PO TABS
30.0000 mg | ORAL_TABLET | Freq: Four times a day (QID) | ORAL | Status: DC
  Administered 2020-04-25: 30 mg via ORAL
  Filled 2020-04-25 (×3): qty 1

## 2020-04-25 MED ORDER — ACETAMINOPHEN 325 MG PO TABS
650.0000 mg | ORAL_TABLET | ORAL | Status: DC | PRN
  Administered 2020-04-25: 650 mg via ORAL
  Filled 2020-04-25: qty 2

## 2020-04-25 MED ORDER — INSULIN GLARGINE 100 UNIT/ML ~~LOC~~ SOLN
15.0000 [IU] | Freq: Every day | SUBCUTANEOUS | Status: DC
  Administered 2020-04-25 (×2): 15 [IU] via SUBCUTANEOUS
  Filled 2020-04-25 (×3): qty 0.15

## 2020-04-25 MED ORDER — ALLOPURINOL 100 MG PO TABS
100.0000 mg | ORAL_TABLET | Freq: Every day | ORAL | Status: DC
  Administered 2020-04-25 – 2020-04-26 (×2): 100 mg via ORAL
  Filled 2020-04-25 (×2): qty 1

## 2020-04-25 MED ORDER — INSULIN ASPART 100 UNIT/ML ~~LOC~~ SOLN: 0.0000 [IU] | Freq: Every day | SUBCUTANEOUS | Status: DC

## 2020-04-25 MED ORDER — ONDANSETRON HCL 4 MG/2ML IJ SOLN: 4.0000 mg | Freq: Four times a day (QID) | INTRAMUSCULAR | Status: DC | PRN

## 2020-04-25 MED ORDER — ATORVASTATIN CALCIUM 10 MG PO TABS
20.0000 mg | ORAL_TABLET | Freq: Every day | ORAL | Status: DC
  Administered 2020-04-25: 20 mg via ORAL
  Filled 2020-04-25 (×2): qty 2

## 2020-04-25 MED ORDER — DICLOFENAC SODIUM 1 % EX GEL
2.0000 g | Freq: Four times a day (QID) | CUTANEOUS | Status: DC | PRN
  Administered 2020-04-25: 2 g via TOPICAL
  Filled 2020-04-25: qty 100

## 2020-04-25 MED ORDER — GABAPENTIN 300 MG PO CAPS
300.0000 mg | ORAL_CAPSULE | Freq: Two times a day (BID) | ORAL | Status: DC
  Administered 2020-04-25 – 2020-04-26 (×4): 300 mg via ORAL
  Filled 2020-04-25 (×4): qty 1

## 2020-04-25 MED ORDER — ASPIRIN 81 MG PO CHEW
81.0000 mg | CHEWABLE_TABLET | Freq: Every day | ORAL | Status: DC
  Administered 2020-04-25 – 2020-04-26 (×2): 81 mg via ORAL
  Filled 2020-04-25 (×2): qty 1

## 2020-04-25 MED ORDER — APIXABAN 5 MG PO TABS
5.0000 mg | ORAL_TABLET | Freq: Two times a day (BID) | ORAL | Status: DC
  Administered 2020-04-25 – 2020-04-26 (×3): 5 mg via ORAL
  Filled 2020-04-25 (×3): qty 1

## 2020-04-25 MED ORDER — PANTOPRAZOLE SODIUM 40 MG PO TBEC
40.0000 mg | DELAYED_RELEASE_TABLET | Freq: Every day | ORAL | Status: DC
  Administered 2020-04-25 – 2020-04-26 (×2): 40 mg via ORAL
  Filled 2020-04-25 (×2): qty 1

## 2020-04-25 MED ORDER — INSULIN ASPART 100 UNIT/ML ~~LOC~~ SOLN
4.0000 [IU] | Freq: Three times a day (TID) | SUBCUTANEOUS | Status: DC
  Administered 2020-04-25 – 2020-04-26 (×3): 4 [IU] via SUBCUTANEOUS

## 2020-04-25 MED ORDER — FUROSEMIDE 20 MG PO TABS
20.0000 mg | ORAL_TABLET | Freq: Every day | ORAL | Status: DC
  Administered 2020-04-26: 20 mg via ORAL
  Filled 2020-04-25: qty 1

## 2020-04-25 MED ORDER — DILTIAZEM HCL 60 MG PO TABS
60.0000 mg | ORAL_TABLET | Freq: Four times a day (QID) | ORAL | Status: DC
  Administered 2020-04-25: 30 mg via ORAL
  Administered 2020-04-25 – 2020-04-26 (×4): 60 mg via ORAL
  Filled 2020-04-25 (×5): qty 1

## 2020-04-25 NOTE — Progress Notes (Signed)
Subjective:  Denies SSCP, palpitations or Dyspnea Feels better rate improved   Objective:  Vitals:   04/25/20 0417 04/25/20 0500 04/25/20 0608 04/25/20 0705  BP:  117/70 118/77 (!) 135/92  Pulse: 64 67 70 100  Resp:  (!) 23 20   Temp:    98.2 F (36.8 C)  TempSrc:    Oral  SpO2:  99% 100% 100%    Intake/Output from previous day: No intake or output data in the 24 hours ending 04/25/20 0733  Physical Exam: Affect appropriate Healthy:  appears stated age HEENT: normal Neck supple with no adenopathy JVP normal no bruits no thyromegaly Lungs clear with no wheezing and good diaphragmatic motion Heart:  S1/S2 no murmur, no rub, gallop or click PMI normal Abdomen: benighn, BS positve, no tenderness, no AAA no bruit.  No HSM or HJR Distal pulses intact with no bruits No edema Neuro non-focal Skin warm and dry No muscular weakness   Lab Results: Basic Metabolic Panel: Recent Labs    04/24/20 1857 04/25/20 0122  NA 133*  --   K 4.2  --   CL 99  --   CO2 24  --   GLUCOSE 141*  --   BUN 57*  --   CREATININE 2.08*  --   CALCIUM 9.3  --   MG  --  2.0   Liver Function Tests: Recent Labs    04/25/20 0122  AST 24  ALT 18  ALKPHOS 51  BILITOT 0.7  PROT 7.7  ALBUMIN 3.6   No results for input(s): LIPASE, AMYLASE in the last 72 hours. CBC: Recent Labs    04/24/20 1857 04/25/20 0122  WBC 7.0 6.2  NEUTROABS 3.6  --   HGB 11.1* 10.8*  HCT 34.8* 33.6*  MCV 88.5 88.0  PLT 283 289   Cardiac Enzymes: No results for input(s): CKTOTAL, CKMB, CKMBINDEX, TROPONINI in the last 72 hours. BNP: Invalid input(s): POCBNP D-Dimer: No results for input(s): DDIMER in the last 72 hours. Hemoglobin A1C: No results for input(s): HGBA1C in the last 72 hours. Fasting Lipid Panel: No results for input(s): CHOL, HDL, LDLCALC, TRIG, CHOLHDL, LDLDIRECT in the last 72 hours. Thyroid Function Tests: Recent Labs    04/25/20 0122  TSH 5.783*   Anemia Panel: No results  for input(s): VITAMINB12, FOLATE, FERRITIN, TIBC, IRON, RETICCTPCT in the last 72 hours.  Imaging: DG Chest Port 1 View  Result Date: 04/24/2020 CLINICAL DATA:  Shortness of breath, weakness and mid chest pressure x1 week. EXAM: PORTABLE CHEST 1 VIEW COMPARISON:  January 16, 2015 FINDINGS: Mild linear atelectasis is seen within the lateral aspect of the mid left lung. There is no evidence of acute infiltrate, pleural effusion or pneumothorax. The cardiac silhouette is mildly enlarged. A right shoulder replacement is seen. This represents a new finding when compared to the prior exam. IMPRESSION: 1. Mild linear atelectasis within the mid left lung. 2. New right shoulder replacement. Electronically Signed   By: Virgina Norfolk M.D.   On: 04/24/2020 19:23    Cardiac Studies:  ECG: afib non specific ST changes    Telemetry: afib rates 80-90   Echo:   Medications:   . allopurinol  100 mg Oral Daily  . aspirin  81 mg Oral Daily  . atorvastatin  20 mg Oral Daily  . diltiazem  30 mg Oral Q6H  . [START ON 04/26/2020] furosemide  20 mg Oral Daily  . gabapentin  300 mg Oral BID  . insulin  aspart  0-20 Units Subcutaneous TID WC  . insulin aspart  0-5 Units Subcutaneous QHS  . insulin aspart  4 Units Subcutaneous TID WC  . insulin glargine  15 Units Subcutaneous QHS  . levothyroxine  150 mcg Oral Q0600  . pantoprazole  40 mg Oral Daily  . terazosin  5 mg Oral Daily     . sodium chloride 20 mL/hr at 04/24/20 1941  . diltiazem (CARDIZEM) infusion 5 mg/hr (04/24/20 1941)  . heparin 950 Units/hr (04/24/20 2259)    Assessment/Plan:   1. PAF:  Improved rate change to PO cardizem and eliquis If she continues to feel well and rate ok on oral meds D/c in am outpatient f/u [facility] 3 weeks  2. HFPEF:  Continue lasix will need further w/u for amyloidosis as outpatient  3. DM:  Discussed low carb diet.  Target hemoglobin A1c is 6.5 or less.  Continue current medications. 4. HLD:  Continue  statin  5. Elevated Troponin: no chest pain or acute ECG changes cors ok by cath 2016 observe   Tentative d/c in am   Jenkins Rouge 04/25/2020, 7:33 AM

## 2020-04-25 NOTE — Progress Notes (Signed)
[place] for heparin Indication: chest pain/ACS  Allergies  Allergen Reactions  . Ace Inhibitors Other (See Comments)    unknown  . Amlodipine Other (See Comments)    unknown  . Atenolol     bradycardia  . Avandia [Rosiglitazone] Other (See Comments)    unknown  . Darvon [Propoxyphene] Other (See Comments)    unknown  . Erythromycin Itching  . Hydralazine     Burning in throat and chest  . Hydrocodone     No reaction per patient, but ineffective  . Levofloxacin Itching  . Morphine And Related Other (See Comments)    Dizzy and hallucianation, vomiting; Willing to try low dose  . Percocet [Oxycodone-Acetaminophen]     hallucination  . Spironolactone Other (See Comments)    unknown  . Tramadol     Unknown/does not recall reaction but does not want to take again    Patient Measurements:   Heparin Dosing Weight: 74 kg   Vital Signs: Temp: 98.5 F (36.9 C) (07/09 1831) Temp Source: Oral (07/09 1831) BP: 109/55 (07/10 0245) Pulse Rate: 70 (07/10 0245)  Labs: Recent Labs    04/24/20 1857 04/24/20 2055 04/25/20 0122 04/25/20 0134  HGB 11.1*  --  10.8*  --   HCT 34.8*  --  33.6*  --   PLT 283  --  289  --   HEPARINUNFRC  --   --   --  0.49  CREATININE 2.08*  --   --   --   TROPONINIHS 211* 179*  --   --     Estimated Creatinine Clearance: 25.4 mL/min (A) (by C-G formula based on SCr of 2.08 mg/dL (H)).  Assessment: 67 YOF with chest pain and elevated troponin to start IV heparin for ACS. H/H low, Plt wnl.   Heparin level therapeutic (0.49) on gtt at 950 units/hr. No bleeding noted.  Goal of Therapy:  Heparin level 0.3-0.7 units/ml Monitor platelets by anticoagulation protocol: Yes   Plan:  -Continue heparin infusion at 950 units/hr -F/u 6 hr heparin level  Sherlon Handing, PharmD, BCPS Please see amion for complete clinical pharmacist phone list 04/25/2020 2:57 AM

## 2020-04-25 NOTE — Evaluation (Signed)
Physical Therapy Evaluation Patient Details Name: Regina [name] MRN: [id number] DOB: October 08, 1941 Today's Date: 04/25/2020   History of Present Illness  79 y.o. female admitted on 04/24/20 for SOB and chest pressure found to be in A-fib with RVR.  Pt with elevated troponin thought to be demand related.  Pt with significant PMH of non obstructive CAD, HTN, DM, CHF, back pain (sciatica R LE), bil TKA, R TSA.    Clinical Impression  Pt reports feeling a bit weak on her feet.  HR during hallway ambulation was 90s-110s.  Pt with mild gait instability, but reports she has a walker and a cane to use at home if needed.  She would benefit from stair practice prior to d/c to her three level home (possible d/c tomorrow).  I would recommend wheel to the stairs and walk back as she fatigued with longer distance hallway ambulation.  PT will continue to follow acutely for safe mobility progression.    Follow Up Recommendations No PT follow up    Equipment Recommendations  None recommended by PT    Recommendations for Other Services   NA    Precautions / Restrictions Precautions Precautions: Fall;Other (comment) Precaution Comments: monitor HR due to A fib      Mobility  Bed Mobility Overal bed mobility: Modified Independent                Transfers Overall transfer level: Needs assistance Equipment used: None Transfers: Sit to/from Stand Sit to Stand: Modified independent (Device/Increase time)            Ambulation/Gait Ambulation/Gait assistance: Min guard;Min assist Gait Distance (Feet): 300 Feet Assistive device: 1 person hand held assist Gait Pattern/deviations: Step-through pattern;Antalgic;Staggering right;Staggering left Gait velocity: decreased Gait velocity interpretation: 1.31 - 2.62 ft/sec, indicative of limited community ambulator General Gait Details: Pt with stiff legged gait pattern initially, with fatigue it turned into mildly antalgic gait pattern favoring  her right leg (per pt report chronic back pain and R LE sciatica).          Balance Overall balance assessment: Needs assistance Sitting-balance support: Feet supported;No upper extremity supported Sitting balance-Leahy Scale: Good     Standing balance support: No upper extremity supported Standing balance-Leahy Scale: Good                               Pertinent Vitals/Pain Pain Assessment: Faces Faces Pain Scale: Hurts little more Pain Location: back and R leg with gait Pain Descriptors / Indicators: Burning;Aching Pain Intervention(s): Limited activity within patient's tolerance;Monitored during session;Repositioned    Home Living Family/patient expects to be discharged to:: Private residence Living Arrangements: Children (daughter who works from home) Available Help at Discharge: Family;Available 24 hours/day Type of Home: House       Home Layout: Multi-level (3 level) Home Equipment: Walker - 2 wheels;Cane - single point Additional Comments: Retired Research scientist (life sciences), 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 and 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, loves children    Prior Function Level of Independence: Independent         Comments: does not normally walk with an AD, recently dx with sciatica and MD wants her to do aquatic therapy, so she joined the [employer]   Dominant Hand: Right    Extremity/Trunk Assessment   Upper Extremity Assessment Upper Extremity Assessment: Overall WFL for tasks assessed    Lower Extremity Assessment Lower Extremity Assessment: Generalized weakness (stiffness)  Cervical / Trunk Assessment Cervical / Trunk Assessment: Other exceptions Cervical / Trunk Exceptions: pt reports chronic low back pain and R LE sciatica, does not want surgery  Communication   Communication: No difficulties  Cognition Arousal/Alertness: Awake/alert Behavior During Therapy: WFL for tasks assessed/performed Overall Cognitive Status: Within Functional Limits for tasks  assessed                                        General Comments General comments (skin integrity, edema, etc.): HR 90s-110s during gait in a-fib.         Assessment/Plan    PT Assessment Patient needs continued PT services  PT Problem List Decreased activity tolerance;Decreased balance;Decreased mobility;Decreased knowledge of use of DME;Cardiopulmonary status limiting activity;Pain;Obesity       PT Treatment Interventions DME instruction;Gait training;Stair training;Functional mobility training;Therapeutic activities;Therapeutic exercise;Balance training;Patient/family education;Modalities    PT Goals (Current goals can be found in the Care Plan section)  Acute Rehab PT Goals Patient Stated Goal: to start water aerobics soon PT Goal Formulation: With patient Time For Goal Achievement: 05/09/20 Potential to Achieve Goals: Good    Frequency Min 3X/week           AM-PAC PT "6 Clicks" Mobility  Outcome Measure Help needed turning from your back to your side while in a flat bed without using bedrails?: None Help needed moving from lying on your back to sitting on the side of a flat bed without using bedrails?: None Help needed moving to and from a bed to a chair (including a wheelchair)?: None Help needed standing up from a chair using your arms (e.g., wheelchair or bedside chair)?: None Help needed to walk in hospital room?: A Little Help needed climbing 3-5 steps with a railing? : A Little 6 Click Score: 22    End of Session   Activity Tolerance: Patient limited by fatigue;Patient limited by pain Patient left: in bed;with call bell/phone within reach   PT Visit Diagnosis: Muscle weakness (generalized) (M62.81);Difficulty in walking, not elsewhere classified (R26.2);Pain Pain - Right/Left: Right Pain - part of body: Leg    Time: 1205-1225 PT Time Calculation (min) (ACUTE ONLY): 20 min   Charges:       Verdene Lennert, PT, DPT  Acute  Rehabilitation [phone] pager #[phone] office     PT Evaluation $PT Eval Moderate Complexity: 1 Mod

## 2020-04-26 ENCOUNTER — Encounter (HOSPITAL_COMMUNITY): Payer: Self-pay | Admitting: Student

## 2020-04-26 LAB — CBC
HCT: 30.9 % — ABNORMAL LOW (ref 36.0–46.0)
Hemoglobin: 10 g/dL — ABNORMAL LOW (ref 12.0–15.0)
MCH: 28.7 pg (ref 26.0–34.0)
MCHC: 32.4 g/dL (ref 30.0–36.0)
MCV: 88.8 fL (ref 80.0–100.0)
Platelets: 261 10*3/uL (ref 150–400)
RBC: 3.48 MIL/uL — ABNORMAL LOW (ref 3.87–5.11)
RDW: 14.4 % (ref 11.5–15.5)
WBC: 6.9 10*3/uL (ref 4.0–10.5)
nRBC: 0 % (ref 0.0–0.2)

## 2020-04-26 LAB — BASIC METABOLIC PANEL
Anion gap: 10 (ref 5–15)
BUN: 48 mg/dL — ABNORMAL HIGH (ref 8–23)
CO2: 27 mmol/L (ref 22–32)
Calcium: 9 mg/dL (ref 8.9–10.3)
Chloride: 100 mmol/L (ref 98–111)
Creatinine, Ser: 1.94 mg/dL — ABNORMAL HIGH (ref 0.44–1.00)
GFR calc Af Amer: 28 mL/min — ABNORMAL LOW (ref >60)
GFR calc non Af Amer: 25 mL/min — ABNORMAL LOW (ref >60)
Glucose, Bld: 97 mg/dL (ref 70–99)
Potassium: 3.9 mmol/L (ref 3.5–5.1)
Sodium: 137 mmol/L (ref 135–145)

## 2020-04-26 LAB — GLUCOSE, CAPILLARY: Glucose-Capillary: 83 mg/dL (ref 70–99)

## 2020-04-26 MED ORDER — APIXABAN 5 MG PO TABS: 5.0000 mg | ORAL_TABLET | Freq: Two times a day (BID) | ORAL | 11 refills | Status: DC

## 2020-04-26 MED ORDER — DILTIAZEM HCL ER COATED BEADS 180 MG PO CP24
180.0000 mg | ORAL_CAPSULE | Freq: Every day | ORAL | 11 refills | Status: DC
Start: 2020-04-26 — End: 2020-05-07

## 2020-04-26 MED ORDER — APIXABAN 5 MG PO TABS: 5.0000 mg | ORAL_TABLET | Freq: Two times a day (BID) | ORAL | 0 refills | Status: DC

## 2020-04-26 NOTE — Care Management (Signed)
Patient given Eliquis 30 day free card.  Patient will address copay.

## 2020-04-26 NOTE — Discharge Summary (Addendum)
Discharge Summary    Patient ID: Regina [name] MRN: [id number]; DOB: 1941-05-25  Admit date: 04/24/2020 Discharge date: 04/26/2020  Primary Care Provider: [name], [name], MD  Primary Cardiologist: Evalina Field, MD  Primary Electrophysiologist:  None   Discharge Diagnoses    Active Problems:   Atrial fibrillation with rapid ventricular response (HCC)   Allergies Allergies  Allergen Reactions  . Ace Inhibitors Other (See Comments)    unknown  . Amlodipine Other (See Comments)    unknown  . Atenolol     bradycardia  . Avandia [Rosiglitazone] Other (See Comments)    unknown  . Darvon [Propoxyphene] Other (See Comments)    unknown  . Erythromycin Itching  . Hydralazine     Burning in throat and chest  . Hydrocodone     No reaction per patient, but ineffective  . Levofloxacin Itching  . Morphine And Related Other (See Comments)    Dizzy and hallucianation, vomiting; Willing to try low dose  . Percocet [Oxycodone-Acetaminophen]     hallucination  . Spironolactone Other (See Comments)    unknown  . Tramadol     Unknown/does not recall reaction but does not want to take again    Diagnostic Studies/Procedures    None this admission _____________   History of Present Illness     Regina [name] is a 79 y.o. female with hx CAD, CHF, DM, HTN, HLD, who was admitted 7/9 with rapid atrial fibrillation.  Hospital Course     Consultants: None  She was rate controlled in the ER with IV diltiazem.  She had recently been evaluated for amyloid with a positive pyrophosphate study.  Her new onset atrial arrhythmias consistent with this.  Further work-up can be as an outpatient.  She was transitioned from IV to oral diltiazem. She has a history of a reaction to amlodipine, but cannot remember exactly what that was. Has gotten several doses of Cardizem, no side effects or reaction noted. Ok to d/c on long-acting Cardizem, pt aware to watch for reaction.  She was started  on Eliquis. CHA2DS2-VASc = 7 (age x 2, female, CAD, CHF, DM, HTN).  Her TSH was mildly elevated, she was continued on her home dose of Synthroid.  Follow-up as an outpatient with Dr [name].  She is mildly anemic, with a hemoglobin 10-11, this will have to be followed.  Continue Eliquis and aspirin for now, Dr. Audie Box to decide in follow-up if she needs the aspirin.  Her creatinine is slightly above her baseline. Her losartan was held on admission to allow for the addition of the diltiazem.  Follow renal function and blood pressure as an outpatient.  She had a mild troponin elevation felt secondary to her infiltrative cardiomyopathy and uncontrolled ventricular rates.  She had non-obstructive CAD by cath 2016. She did not have chest pain.  Follow for symptoms.  No ischemic work-up indicated at this time.  On 7/11, she was seen by Dr Johnsie Cancel and all data were reviewed.  Her heart rate was generally controlled and she was tolerating the oral diltiazem.  He recommended considering cardioversion in 3 weeks.  Her EF was normal with only mild left atrial enlargement.  No further work-up is indicated and she is considered stable for discharge, to follow-up as an outpatient.  _____________  Discharge Vitals Blood pressure (!) 101/49, pulse 88, temperature 97.9 F (36.6 C), temperature source Oral, resp. rate 18, weight 100.6 kg, SpO2 98 %.  Filed Weights   04/26/20 [phone]  Weight: 100.6 kg    Labs & Radiologic Studies    CBC Recent Labs    04/24/20 1857 04/24/20 1857 04/25/20 0122 04/26/20 0326  WBC 7.0   < > 6.2 6.9  NEUTROABS 3.6  --   --   --   HGB 11.1*   < > 10.8* 10.0*  HCT 34.8*   < > 33.6* 30.9*  MCV 88.5   < > 88.0 88.8  PLT 283   < > 289 261   < > = values in this interval not displayed.   Basic Metabolic Panel Recent Labs    04/24/20 1857 04/25/20 0122 04/25/20 0726 04/26/20 0326  NA   < >  --  137 137  K   < >  --  3.9 3.9  CL   < >  --  101 100  CO2   < >  --  24  27  GLUCOSE   < >  --  106* 97  BUN   < >  --  51* 48*  CREATININE   < >  --  1.92* 1.94*  CALCIUM   < >  --  9.3 9.0  MG  --  2.0 1.9  --    < > = values in this interval not displayed.   Liver Function Tests Recent Labs    04/25/20 0122  AST 24  ALT 18  ALKPHOS 51  BILITOT 0.7  PROT 7.7  ALBUMIN 3.6   No results for input(s): LIPASE, AMYLASE in the last 72 hours. High Sensitivity Troponin:   Recent Labs  Lab 04/24/20 1857 04/24/20 2055  TROPONINIHS 211* 179*    BNP Invalid input(s): POCBNP D-Dimer No results for input(s): DDIMER in the last 72 hours. Hemoglobin A1C No results for input(s): HGBA1C in the last 72 hours. Fasting Lipid Panel No results for input(s): CHOL, HDL, LDLCALC, TRIG, CHOLHDL, LDLDIRECT in the last 72 hours. Thyroid Function Tests Recent Labs    04/25/20 0122  TSH 5.783*  No results found for: FREET4   _____________  DG Chest Port 1 View  Result Date: 04/24/2020 CLINICAL DATA:  Shortness of breath, weakness and mid chest pressure x1 week. EXAM: PORTABLE CHEST 1 VIEW COMPARISON:  January 16, 2015 FINDINGS: Mild linear atelectasis is seen within the lateral aspect of the mid left lung. There is no evidence of acute infiltrate, pleural effusion or pneumothorax. The cardiac silhouette is mildly enlarged. A right shoulder replacement is seen. This represents a new finding when compared to the prior exam. IMPRESSION: 1. Mild linear atelectasis within the mid left lung. 2. New right shoulder replacement. Electronically Signed   By: Virgina Norfolk M.D.   On: 04/24/2020 19:23   ECHOCARDIOGRAM COMPLETE  Result Date: 04/03/2020    ECHOCARDIOGRAM REPORT   Patient Name:   Regina [name] Date of Exam: 04/03/2020 Medical Rec #:  [id number]         Height:       62.0 in Accession #:    [id number]        Weight:       224.0 lb Date of Birth:  04-07-1941        BSA:          2.006 m Patient Age:    56 years          BP:           145/73 mmHg Patient Gender: F  HR:           100 bpm. Exam Location:  [facility] Procedure: 2D Echo, Cardiac Doppler and Color Doppler Indications:    R06.02 SOB  History:        Patient has prior history of Echocardiogram examinations, most                 recent 01/18/2015. CAD; Risk Factors:Hypertension and Diabetes.                 Fatigue. Chronic kidney disease. Sleep apnea. Thyroid disease.  Sonographer:    Diamond Nickel RCS Referring Phys: [id number] Maish Vaya  1. Left ventricular ejection fraction, by estimation, is 55 to 60%. The left ventricle has normal function. The left ventricle has no regional wall motion abnormalities. There is moderate concentric left ventricular hypertrophy. Left ventricular diastolic parameters are consistent with Grade II diastolic dysfunction (pseudonormalization). Elevated left atrial pressure.  2. Right ventricular systolic function is normal. The right ventricular size is normal. There is normal pulmonary artery systolic pressure.  3. Left atrial size was mildly dilated.  4. The mitral valve is normal in structure. Mild mitral valve regurgitation. No evidence of mitral stenosis.  5. The aortic valve is normal in structure. Aortic valve regurgitation is trivial. No aortic stenosis is present.  6. The inferior vena cava is normal in size with greater than 50% respiratory variability, suggesting right atrial pressure of 3 mmHg. FINDINGS  Left Ventricle: Left ventricular ejection fraction, by estimation, is 55 to 60%. The left ventricle has normal function. The left ventricle has no regional wall motion abnormalities. The left ventricular internal cavity size was normal in size. There is  moderate concentric left ventricular hypertrophy. Left ventricular diastolic parameters are consistent with Grade II diastolic dysfunction (pseudonormalization). Elevated left atrial pressure. Right Ventricle: The right ventricular size is normal. No increase in right ventricular wall  thickness. Right ventricular systolic function is normal. There is normal pulmonary artery systolic pressure. Left Atrium: Left atrial size was mildly dilated. Right Atrium: Right atrial size was normal in size. Pericardium: There is no evidence of pericardial effusion. Mitral Valve: The mitral valve is normal in structure. Normal mobility of the mitral valve leaflets. Mild mitral valve regurgitation. No evidence of mitral valve stenosis. Tricuspid Valve: The tricuspid valve is normal in structure. Tricuspid valve regurgitation is mild . No evidence of tricuspid stenosis. Aortic Valve: The aortic valve is normal in structure. Aortic valve regurgitation is trivial. No aortic stenosis is present. Pulmonic Valve: The pulmonic valve was normal in structure. Pulmonic valve regurgitation is not visualized. No evidence of pulmonic stenosis. Aorta: The aortic root is normal in size and structure. Venous: The inferior vena cava is normal in size with greater than 50% respiratory variability, suggesting right atrial pressure of 3 mmHg. IAS/Shunts: No atrial level shunt detected by color flow Doppler.  LEFT VENTRICLE PLAX 2D LVIDd:         4.10 cm  Diastology LVIDs:         3.20 cm  LV e' lateral:   4.81 cm/s LV PW:         1.50 cm  LV E/e' lateral: 24.1 LV IVS:        1.40 cm  LV e' medial:    2.87 cm/s LVOT diam:     2.15 cm  LV E/e' medial:  40.4 LV SV:         74 LV SV Index:   37 LVOT  Area:     3.63 cm                          3D Volume EF:                         3D EF:        41 %                         LV EDV:       142 ml                         LV ESV:       83 ml                         LV SV:        59 ml RIGHT VENTRICLE RV Basal diam:  2.40 cm RV S prime:     6.25 cm/s TAPSE (M-mode): 1.3 cm LEFT ATRIUM             Index       RIGHT ATRIUM           Index LA diam:        3.60 cm 1.79 cm/m  RA Area:     14.30 cm LA Vol (A2C):   46.8 ml 23.33 ml/m RA Volume:   34.00 ml  16.95 ml/m LA Vol (A4C):   72.3 ml 36.04  ml/m LA Biplane Vol: 59.0 ml 29.41 ml/m  AORTIC VALVE LVOT Vmax:   107.00 cm/s LVOT Vmean:  68.100 cm/s LVOT VTI:    0.205 m  AORTA Ao Root diam: 2.80 cm MITRAL VALVE MV Area (PHT): 4.49 cm     SHUNTS MV Decel Time: 169 msec     Systemic VTI:  0.20 m MV E velocity: 116.00 cm/s  Systemic Diam: 2.15 cm MV A velocity: 62.40 cm/s MV E/A ratio:  1.86 Ena Dawley MD Electronically signed by Ena Dawley MD Signature Date/Time: 04/03/2020/1:35:20 PM    Final    MYOCARDIAL AMYLOID IMAGING PLANAR AND SPECT  Result Date: 04/16/2020 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  Disposition   Pt is being discharged home today in improved condition.  Follow-up Plans & Appointments     Discharge Instructions    Diet - low sodium heart healthy   Complete by: As directed    Diet Carb Modified   Complete by: As directed    Increase activity slowly   Complete by: As directed       Discharge Medications   Allergies as of 04/26/2020      Reactions   Ace Inhibitors Other (See Comments)   unknown   Amlodipine Other (See Comments)   unknown   Atenolol    bradycardia   Avandia [rosiglitazone] Other (See Comments)   unknown   Darvon [propoxyphene] Other (See Comments)   unknown   Erythromycin Itching   Hydralazine    Burning in throat and chest   Hydrocodone    No reaction per patient, but ineffective   Levofloxacin Itching   Morphine And Related Other (See Comments)   Dizzy and hallucianation, vomiting; Willing to try low dose   Percocet [oxycodone-acetaminophen]    hallucination   Spironolactone Other (See Comments)   unknown   Tramadol    Unknown/does  not recall reaction but does not want to take again      Medication List    STOP taking these medications   losartan 50 MG tablet Commonly known as: COZAAR   magnesium oxide 400 MG tablet Commonly known as: MAG-OX     TAKE these medications   Accu-Chek FastClix Lancet Kit Use to test  blood sugar up to 3 times daily   Accu-Chek Guide test strip Generic drug: glucose blood Use to test blood sugar up to 3 times daily   Accu-Chek Guide w/Device Kit 1 Device by Does not apply route 3 (three) times daily.   allopurinol 100 MG tablet Commonly known as: ZYLOPRIM Take 1 tablet (100 mg total) by mouth daily.   apixaban 5 MG Tabs tablet Commonly known as: ELIQUIS Take 1 tablet (5 mg total) by mouth 2 (two) times daily.   aspirin 81 MG tablet Take 81 mg by mouth daily.   atorvastatin 20 MG tablet Commonly known as: LIPITOR Take 1 tablet (20 mg total) by mouth daily.   B-D SINGLE USE SWABS REGULAR Pads Use to test blood sugar up to 3 times daily   clotrimazole-betamethasone cream Commonly known as: Lotrisone Apply 1 application topically 2 (two) times daily as needed. What changed: reasons to take this   cyclobenzaprine 5 MG tablet Commonly known as: FLEXERIL Take 1 tablet (5 mg total) by mouth daily as needed. What changed: reasons to take this   diltiazem 180 MG 24 hr capsule Commonly known as: Cardizem CD Take 1 capsule (180 mg total) by mouth daily.   Fish Oil 1000 MG Caps Take 1,000 mg by mouth daily.   fluticasone 50 MCG/ACT nasal spray Commonly known as: FLONASE Place 1 spray into both nostrils daily as needed for allergies or rhinitis.   furosemide 20 MG tablet Commonly known as: LASIX Take 1 tablet (20 mg total) by mouth daily.   gabapentin 600 MG tablet Commonly known as: NEURONTIN Take 1 tablet by mouth twice daily   GERITOL COMPLETE PO Take 1 tablet by mouth daily.   lactulose 10 GM/15ML solution Commonly known as: CHRONULAC Take 20 g by mouth daily as needed for mild constipation.   levothyroxine 150 MCG tablet Commonly known as: SYNTHROID Take 150 mcg by mouth daily.   loratadine 10 MG tablet Commonly known as: CLARITIN Take 1 tablet (10 mg total) by mouth daily as needed. What changed: reasons to take this   magnesium  oxide 400 (241.3 Mg) MG tablet Commonly known as: MAG-OX Take 1 tablet by mouth once daily   meclizine 25 MG tablet Commonly known as: ANTIVERT Take 25 mg by mouth daily as needed for dizziness.   omeprazole 40 MG capsule Commonly known as: PRILOSEC Take 1 capsule (40 mg total) by mouth daily.   Ozempic (1 MG/DOSE) 2 MG/1.5ML Sopn Generic drug: Semaglutide (1 MG/DOSE) Inject 0.75 mLs (1 mg total) into the skin once a week.   SYSTANE BALANCE OP Place 1 drop into both eyes daily as needed (for dry eyes).   terazosin 5 MG capsule Commonly known as: HYTRIN Take 1 capsule (5 mg total) by mouth daily.   Toujeo SoloStar 300 UNIT/ML Solostar Pen Generic drug: insulin glargine (1 Unit Dial) Inject 35 Units into the skin daily. What changed: how much to take   VITA-C PO Take 1 tablet by mouth daily.   VITAMIN D PO Take 1 tablet by mouth daily.          Outstanding Labs/Studies  None  Duration of Discharge Encounter   Greater than 30 minutes including physician time.  Signed, Rosaria Ferries, PA-C 04/26/2020, 9:48 AM

## 2020-04-26 NOTE — Progress Notes (Signed)
Physical Therapy Treatment Patient Details Name: Regina [name] MRN: [id number] DOB: Jul 26, 1941 Today's Date: 04/26/2020    History of Present Illness 80 y.o. female admitted on 04/24/20 for SOB and chest pressure found to be in A-fib with RVR.  Pt with elevated troponin thought to be demand related.  Pt with significant PMH of non obstructive CAD, HTN, DM, CHF, back pain (sciatica R LE), bil TKA, R TSA.      PT Comments    Continuing work on functional mobility and activity tolerance;  Noting plans for dc home, and session focused on progressive amb and stairs -- both of which she performed well; OK for dc home from PT standpoint    Follow Up Recommendations  No PT follow up     Equipment Recommendations  None recommended by PT    Recommendations for Other Services       Precautions / Restrictions      Mobility  Bed Mobility Overal bed mobility: Modified Independent                Transfers   Equipment used: None Transfers: Sit to/from Stand Sit to Stand: Modified independent (Device/Increase time)            Ambulation/Gait Ambulation/Gait assistance: Supervision;Independent Gait Distance (Feet): 200 Feet Assistive device: None Gait Pattern/deviations: Step-through pattern Gait velocity: decreased   General Gait Details: Slow cadence, but overall moving well; cues to self-monitor for activity tolerance   Stairs Stairs: Yes Stairs assistance: Supervision Stair Management: Two rails;Alternating pattern;Forwards Number of Stairs: 12 General stair comments: Took the stairs slowly but safely; cues to self-monitor for activity tolerance   Wheelchair Mobility    Modified Rankin (Stroke Patients Only)       Balance     Sitting balance-Leahy Scale: Good       Standing balance-Leahy Scale: Good                              Cognition Arousal/Alertness: Awake/alert Behavior During Therapy: WFL for tasks  assessed/performed Overall Cognitive Status: Within Functional Limits for tasks assessed                                        Exercises      General Comments        Pertinent Vitals/Pain Pain Assessment: No/denies pain Pain Location: Regina [name] did not indicate she is in pain, and her gait pattern was not overtly antalgic    Home Living Family/patient expects to be discharged to:: Private residence Living Arrangements: Children                  Prior Function            PT Goals (current goals can now be found in the care plan section) Acute Rehab PT Goals Patient Stated Goal: Home soon PT Goal Formulation: With patient Time For Goal Achievement: 05/09/20 Potential to Achieve Goals: Good Progress towards PT goals: Progressing toward goals    Frequency    Min 3X/week      PT Plan Current plan remains appropriate    Co-evaluation              AM-PAC PT "6 Clicks" Mobility   Outcome Measure  Help needed turning from your back to your side while in a flat bed without using bedrails?:  None Help needed moving from lying on your back to sitting on the side of a flat bed without using bedrails?: None Help needed moving to and from a bed to a chair (including a wheelchair)?: None Help needed standing up from a chair using your arms (e.g., wheelchair or bedside chair)?: None Help needed to walk in hospital room?: A Little Help needed climbing 3-5 steps with a railing? : A Little 6 Click Score: 22    End of Session   Activity Tolerance: Patient tolerated treatment well Patient left: in chair;with call bell/phone within reach (Preparing to go home) Nurse Communication: Mobility status PT Visit Diagnosis: Muscle weakness (generalized) (M62.81);Difficulty in walking, not elsewhere classified (R26.2);Pain Pain - Right/Left: Right Pain - part of body: Leg     Time: 2072-1828 PT Time Calculation (min) (ACUTE ONLY): 12 min  Charges:   $Gait Training: 8-22 mins                     Roney Marion, PT  [place] Pager [phone] Office [place] 04/26/2020, 12:11 PM

## 2020-04-26 NOTE — Progress Notes (Signed)
Notified by CCMD of short burst of SVR ~ 05:10. Pt was sleeping. Will continue to monitor

## 2020-04-26 NOTE — Progress Notes (Signed)
Subjective:  Eating breakfast  Feels a little jittery  Thinks she is well enough to go home   Objective:  Vitals:   04/26/20 0156 04/26/20 0337 04/26/20 0648 04/26/20 0732  BP: 109/61 (!) 111/49 (!) 124/94 (!) 101/49  Pulse:  98  88  Resp:  18  18  Temp:  98.2 F (36.8 C)  97.9 F (36.6 C)  TempSrc:  Oral  Oral  SpO2:  95%  98%  Weight:  100.6 kg      Intake/Output from previous day:  Intake/Output Summary (Last 24 hours) at 04/26/2020 0758 Last data filed at 04/25/2020 2054 Gross per 24 hour  Intake 486.25 ml  Output --  Net 486.25 ml    Physical Exam: Affect appropriate Healthy:  appears stated age HEENT: normal Neck supple with no adenopathy JVP normal no bruits no thyromegaly Lungs clear with no wheezing and good diaphragmatic motion Heart:  S1/S2 no murmur, no rub, gallop or click PMI normal Abdomen: benighn, BS positve, no tenderness, no AAA no bruit.  No HSM or HJR Distal pulses intact with no bruits No edema Neuro non-focal Skin warm and dry No muscular weakness   Lab Results: Basic Metabolic Panel: Recent Labs    04/24/20 1857 04/25/20 0122 04/25/20 0726 04/26/20 0326  NA   < >  --  137 137  K   < >  --  3.9 3.9  CL   < >  --  101 100  CO2   < >  --  24 27  GLUCOSE   < >  --  106* 97  BUN   < >  --  51* 48*  CREATININE   < >  --  1.92* 1.94*  CALCIUM   < >  --  9.3 9.0  MG  --  2.0 1.9  --    < > = values in this interval not displayed.   Liver Function Tests: Recent Labs    04/25/20 0122  AST 24  ALT 18  ALKPHOS 51  BILITOT 0.7  PROT 7.7  ALBUMIN 3.6   No results for input(s): LIPASE, AMYLASE in the last 72 hours. CBC: Recent Labs    04/24/20 1857 04/24/20 1857 04/25/20 0122 04/26/20 0326  WBC 7.0   < > 6.2 6.9  NEUTROABS 3.6  --   --   --   HGB 11.1*   < > 10.8* 10.0*  HCT 34.8*   < > 33.6* 30.9*  MCV 88.5   < > 88.0 88.8  PLT 283   < > 289 261   < > = values in this interval not displayed.   Cardiac  Enzymes: No results for input(s): CKTOTAL, CKMB, CKMBINDEX, TROPONINI in the last 72 hours. BNP: Invalid input(s): POCBNP D-Dimer: No results for input(s): DDIMER in the last 72 hours. Hemoglobin A1C: No results for input(s): HGBA1C in the last 72 hours. Fasting Lipid Panel: No results for input(s): CHOL, HDL, LDLCALC, TRIG, CHOLHDL, LDLDIRECT in the last 72 hours. Thyroid Function Tests: Recent Labs    04/25/20 0122  TSH 5.783*   Anemia Panel: No results for input(s): VITAMINB12, FOLATE, FERRITIN, TIBC, IRON, RETICCTPCT in the last 72 hours.  Imaging: DG Chest Port 1 View  Result Date: 04/24/2020 CLINICAL DATA:  Shortness of breath, weakness and mid chest pressure x1 week. EXAM: PORTABLE CHEST 1 VIEW COMPARISON:  January 16, 2015 FINDINGS: Mild linear atelectasis is seen within the lateral aspect of the mid left lung.  There is no evidence of acute infiltrate, pleural effusion or pneumothorax. The cardiac silhouette is mildly enlarged. A right shoulder replacement is seen. This represents a new finding when compared to the prior exam. IMPRESSION: 1. Mild linear atelectasis within the mid left lung. 2. New right shoulder replacement. Electronically Signed   By: Virgina Norfolk M.D.   On: 04/24/2020 19:23    Cardiac Studies:  ECG: afib non specific ST changes    Telemetry: afib rates 80-90   Echo: 04/03/20 EF 55-60% mild LAE grade 2 diastolic   Medications:   . allopurinol  100 mg Oral Daily  . apixaban  5 mg Oral BID  . aspirin  81 mg Oral Daily  . atorvastatin  20 mg Oral Daily  . diltiazem  60 mg Oral Q6H  . furosemide  20 mg Oral Daily  . gabapentin  300 mg Oral BID  . insulin aspart  0-20 Units Subcutaneous TID WC  . insulin aspart  0-5 Units Subcutaneous QHS  . insulin aspart  4 Units Subcutaneous TID WC  . insulin glargine  15 Units Subcutaneous QHS  . levothyroxine  150 mcg Oral Q0600  . pantoprazole  40 mg Oral Daily  . terazosin  5 mg Oral Daily     . sodium  chloride 20 mL/hr at 04/24/20 1941    Assessment/Plan:   1. PAF:  Improved rate on PO meds d/c with cardizem CD 360 mg daily continue eliquis outpatient f/u [facility] [place] in 3 weeks. EF normal only mild LAE  2. HFPEF:  Continue lasix will need further w/u for amyloidosis as outpatient  3. DM:  Discussed low carb diet.  Target hemoglobin A1c is 6.5 or less.  Continue current medications. 4. HLD:  Continue statin  5. Elevated Troponin: no chest pain or acute ECG changes cors ok by cath 2016 observe   D/C home latter this am   Jenkins Rouge 04/26/2020, 7:58 AM

## 2020-04-26 NOTE — Discharge Instructions (Signed)
 Atrial Fibrillation  Atrial fibrillation is a type of irregular or rapid heartbeat (arrhythmia). In atrial fibrillation, the top part of the heart (atria) beats in an irregular pattern. This makes the heart unable to pump blood normally and effectively. The goal of treatment is to prevent blood clots from forming, control your heart rate, or restore your heartbeat to a normal rhythm. If this condition is not treated, it can cause serious problems, such as a weakened heart muscle (cardiomyopathy) or a stroke. What are the causes? This condition is often caused by medical conditions that damage the heart's electrical system. These include:  High blood pressure (hypertension). This is the most common cause.  Certain heart problems or conditions, such as heart failure, coronary artery disease, heart valve problems, or heart surgery.  Diabetes.  Overactive thyroid (hyperthyroidism).  Obesity.  Chronic kidney disease. In some cases, the cause of this condition is not known. What increases the risk? This condition is more likely to develop in:  Older people.  People who smoke.  Athletes who do endurance exercise.  People who have a family history of atrial fibrillation.  Men.  People who use drugs.  People who drink a lot of alcohol.  People who have lung conditions, such as emphysema, pneumonia, or COPD.  People who have obstructive sleep apnea. What are the signs or symptoms? Symptoms of this condition include:  A feeling that your heart is racing or beating irregularly.  Discomfort or pain in your chest.  Shortness of breath.  Sudden light-headedness or weakness.  Tiring easily during exercise or activity.  Fatigue.  Syncope (fainting).  Sweating. In some cases, there are no symptoms. How is this diagnosed? Your health care provider may detect atrial fibrillation when taking your pulse. If detected, this condition may be diagnosed with:  An  electrocardiogram (ECG) to check electrical signals of the heart.  An ambulatory cardiac monitor to record your heart's activity for a few days.  A transthoracic echocardiogram (TTE) to create pictures of your heart.  A transesophageal echocardiogram (TEE) to create even closer pictures of your heart.  A stress test to check your blood supply while you exercise.  Imaging tests, such as a CT scan or chest X-ray.  Blood tests. How is this treated? Treatment depends on underlying conditions and how you feel when you experience atrial fibrillation. This condition may be treated with:  Medicines to prevent blood clots or to treat heart rate or heart rhythm problems.  Electrical cardioversion to reset the heart's rhythm.  A pacemaker to correct abnormal heart rhythm.  Ablation to remove the heart tissue that sends abnormal signals.  Left atrial appendage closure to seal the area where blood clots can form. In some cases, underlying conditions will be treated. Follow these instructions at home: Medicines  Take over-the counter and prescription medicines only as told by your health care provider.  Do not take any new medicines without talking to your health care provider.  If you are taking blood thinners: ? Talk with your health care provider before you take any medicines that contain aspirin or NSAIDs, such as ibuprofen. These medicines increase your risk for dangerous bleeding. ? Take your medicine exactly as told, at the same time every day. ? Avoid activities that could cause injury or bruising, and follow instructions about how to prevent falls. ? Wear a medical alert bracelet or carry a card that lists what medicines you take. Lifestyle      Do not use any   products that contain nicotine or tobacco, such as cigarettes, e-cigarettes, and chewing tobacco. If you need help quitting, ask your health care provider.  Eat heart-healthy foods. Talk with a dietitian to make an  eating plan that is right for you.  Exercise regularly as told by your health care provider.  Do not drink alcohol.  Lose weight if you are overweight.  Do not use drugs, including cannabis. General instructions  If you have obstructive sleep apnea, manage your condition as told by your health care provider.  Do not use diet pills unless your health care provider approves. Diet pills can make heart problems worse.  Keep all follow-up visits as told by your health care provider. This is important. Contact a health care provider if you:  Notice a change in the rate, rhythm, or strength of your heartbeat.  Are taking a blood thinner and you notice more bruising.  Tire more easily when you exercise or do heavy work.  Have a sudden change in weight. Get help right away if you have:   Chest pain, abdominal pain, sweating, or weakness.  Trouble breathing.  Side effects of blood thinners, such as blood in your vomit, stool, or urine, or bleeding that cannot stop.  Any symptoms of a stroke. "BE FAST" is an easy way to remember the main warning signs of a stroke: ? B - Balance. Signs are dizziness, sudden trouble walking, or loss of balance. ? E - Eyes. Signs are trouble seeing or a sudden change in vision. ? F - Face. Signs are sudden weakness or numbness of the face, or the face or eyelid drooping on one side. ? A - Arms. Signs are weakness or numbness in an arm. This happens suddenly and usually on one side of the body. ? S - Speech. Signs are sudden trouble speaking, slurred speech, or trouble understanding what people say. ? T - Time. Time to call emergency services. Write down what time symptoms started.  Other signs of a stroke, such as: ? A sudden, severe headache with no known cause. ? Nausea or vomiting. ? Seizure. These symptoms may represent a serious problem that is an emergency. Do not wait to see if the symptoms will go away. Get medical help right away. Call your  local emergency services (911 in the [place] Do not drive yourself to the hospital. Summary  Atrial fibrillation is a type of irregular or rapid heartbeat (arrhythmia).  Symptoms include a feeling that your heart is beating fast or irregularly.  You may be given medicines to prevent blood clots or to treat heart rate or heart rhythm problems.  Get help right away if you have signs or symptoms of a stroke.  Get help right away if you cannot catch your breath or have chest pain or pressure. This information is not intended to replace advice given to you by your health care provider. Make sure you discuss any questions you have with your health care provider. Document Revised: 03/27/2019 Document Reviewed: 03/27/2019 Elsevier Patient Education  [place] on my medicine - ELIQUIS (apixaban)  This medication education was reviewed with me or my healthcare representative as part of my discharge preparation.  The pharmacist that spoke with me during my hospital stay was:  Brain Hilts, [facility]  Why was Eliquis prescribed for you? Eliquis was prescribed for you to reduce the risk of a blood clot forming that can cause a stroke if you have a medical condition called atrial fibrillation (  a type of irregular heartbeat).  What do You need to know about Eliquis ? Take your Eliquis TWICE DAILY - one tablet in the morning and one tablet in the evening with or without food. If you have difficulty swallowing the tablet whole please discuss with your pharmacist how to take the medication safely.  Take Eliquis exactly as prescribed by your doctor and DO NOT stop taking Eliquis without talking to the doctor who prescribed the medication.  Stopping may increase your risk of developing a stroke.  Refill your prescription before you run out.  After discharge, you should have regular check-up appointments with your healthcare provider that is prescribing your Eliquis.  In the  future your dose may need to be changed if your kidney function or weight changes by a significant amount or as you get older.  What do you do if you miss a dose? If you miss a dose, take it as soon as you remember on the same day and resume taking twice daily.  Do not take more than one dose of ELIQUIS at the same time to make up a missed dose.  Important Safety Information A possible side effect of Eliquis is bleeding. You should call your healthcare provider right away if you experience any of the following: ? Bleeding from an injury or your nose that does not stop. ? Unusual colored urine (red or dark brown) or unusual colored stools (red or black). ? Unusual bruising for unknown reasons. ? A serious fall or if you hit your head (even if there is no bleeding).  Some medicines may interact with Eliquis and might increase your risk of bleeding or clotting while on Eliquis. To help avoid this, consult your healthcare provider or pharmacist prior to using any new prescription or non-prescription medications, including herbals, vitamins, non-steroidal anti-inflammatory drugs (NSAIDs) and supplements.  This website has more information on Eliquis (apixaban): http://www.eliquis.com/eliquis/home

## 2020-04-28 ENCOUNTER — Telehealth: Payer: Self-pay | Admitting: Cardiovascular Disease

## 2020-04-28 NOTE — Telephone Encounter (Signed)
Ask her if she has hemorrhoids. I will see her on 7/20. If it happens again, we need to know.   [name], [place]  [address], [place], [place] [address] [phone]  12:38 PM

## 2020-04-28 NOTE — Telephone Encounter (Signed)
Called patient- she states that she had a bowel movement and noticed blood in her stool, and on the tissue- she states it was very light, and not bright red, but enough she seen it. Patient denies abd cramping, no chest pains/palp, lightheadedness, no symptoms- other than having no energy.  She has been checking her BP and it has been good- Today- 112/54 HR 87 Two days ago- 106/58 HR 84  She did take her medication this morning- but was told to call if she ever seen any bleeding.   Advised patient I would send a message to Dr.O'Neal to advise, but if she seen anymore to give [place] a call as well.  Patient verbalized understanding.

## 2020-04-28 NOTE — Telephone Encounter (Signed)
Called patient- she said she is not aware of having them, but she will monitor and call back with issues, and will be seen next week.

## 2020-04-28 NOTE — Telephone Encounter (Signed)
Returned call. I think she may have hemorrhoids. Recommended stool softener. Feeling better. No CP, SOB, palpitations. She will increase exercise. I will see her next week.   [name], [place]  [address], [place], [place] [address] [phone]  1:37 PM

## 2020-04-28 NOTE — Telephone Encounter (Signed)
New Message  Pt c/o medication issue:  1. Name of Medication: apixaban (ELIQUIS) 5 MG TABS tablet    2. How are you currently taking this medication (dosage and times per day)? Take 1 tablet (5 mg total) by mouth 2 (two) times daily.  3. Are you having a reaction (difficulty breathing--STAT)?   4. What is your medication issue? Patient states that when she had a bowel movement today that she noticed blood when she wiped. She states that she has not seen any since but was told to call if she saw any since she just started Eliquis. Please call to discuss.

## 2020-04-30 ENCOUNTER — Telehealth: Payer: Self-pay | Admitting: Cardiovascular Disease

## 2020-04-30 NOTE — Telephone Encounter (Signed)
     I went in pt's chart to see who called her today. It was a reminder of her appt on 05-05-20

## 2020-05-01 ENCOUNTER — Other Ambulatory Visit: Payer: Self-pay

## 2020-05-01 ENCOUNTER — Ambulatory Visit (INDEPENDENT_AMBULATORY_CARE_PROVIDER_SITE_OTHER): Payer: Medicare Other | Admitting: Family Medicine

## 2020-05-01 ENCOUNTER — Encounter: Payer: Self-pay | Admitting: Family Medicine

## 2020-05-01 VITALS — BP 134/80 | HR 98 | Resp 16 | Ht 62.0 in | Wt 226.0 lb

## 2020-05-01 DIAGNOSIS — N1832 Chronic kidney disease, stage 3b: Secondary | ICD-10-CM | POA: Diagnosis not present

## 2020-05-01 DIAGNOSIS — I4891 Unspecified atrial fibrillation: Secondary | ICD-10-CM

## 2020-05-01 DIAGNOSIS — E039 Hypothyroidism, unspecified: Secondary | ICD-10-CM

## 2020-05-01 DIAGNOSIS — Z23 Encounter for immunization: Secondary | ICD-10-CM | POA: Diagnosis not present

## 2020-05-01 DIAGNOSIS — I152 Hypertension secondary to endocrine disorders: Secondary | ICD-10-CM

## 2020-05-01 DIAGNOSIS — E1159 Type 2 diabetes mellitus with other circulatory complications: Secondary | ICD-10-CM

## 2020-05-01 DIAGNOSIS — K5900 Constipation, unspecified: Secondary | ICD-10-CM

## 2020-05-01 DIAGNOSIS — E1140 Type 2 diabetes mellitus with diabetic neuropathy, unspecified: Secondary | ICD-10-CM | POA: Diagnosis not present

## 2020-05-01 DIAGNOSIS — I10 Essential (primary) hypertension: Secondary | ICD-10-CM

## 2020-05-01 DIAGNOSIS — Z794 Long term (current) use of insulin: Secondary | ICD-10-CM

## 2020-05-01 NOTE — Assessment & Plan Note (Signed)
Not rate nor rhythm controlled. Continue monitoring HR. She has an appt with cardiologist next week.

## 2020-05-01 NOTE — Assessment & Plan Note (Signed)
Keep appt with nephrologist, which according to pt,is being arranged. For now we are not going to resume Losartan. Adequate hydration and low salt diet.

## 2020-05-01 NOTE — Progress Notes (Signed)
HPI: Regina [name] is a 79 y.o. female, who is here today with her daughter for hospital f/u.  She was admitted on 04/24/20 and discharged on 04/26/20. New onset arrhythmia.  Atrial fib: Started with IV diltiazem and transitioned to oral. She is on Eliquis 5 mg bid and Aspirin 81 mg daily. According to daughter amyloid cardiomyopathy is being suspected.  HTN: Currently she is on Diltiazem 180 mg daily. Losartan was discontinued. Nephrology evaluation is pending. She has not noted gross hematuria,foam in urine,or decreased urine output.  Mild LE edema,worse at the end of the day. She is on Furosemide 20 mg daily.  Negative for severe/frequent headache, visual changes, chest pain, dyspnea, or focal weakness. + Palpitations.  Mildly abnormal TSH. She is on Levothyroxine 150 mcg daily. She has not noted abnormal wt changes,cold/heat intolerance,or unusual fatigue.  Lab Results  Component Value Date   TSH 5.783 (H) 04/25/2020   Mild anemia. She has not noted nose/gum bleeding,or melena. Blood on tissue after defecation a few days ago, straining.  Component     Latest Ref Rng & Units 04/26/2020  WBC     4.0 - 10.5 K/uL 6.9  RBC     3.87 - 5.11 MIL/uL 3.48 (L)  Hemoglobin     12.0 - 15.0 g/dL 10.0 (L)  HCT     36 - 46 % 30.9 (L)  MCV     80.0 - 100.0 fL 88.8  MCH     26.0 - 34.0 pg 28.7  MCHC     30.0 - 36.0 g/dL 32.4  RDW     11.5 - 15.5 % 14.4  Platelets     150 - 400 K/uL 261  nRBC     0.0 - 0.2 % 0.0  Neutrophils     %   NEUT#     1.7 - 7.7 K/uL   Lymphocytes     %   Lymphocyte #     0.7 - 4.0 K/uL   Monocytes Relative     %   Monocyte #     0 - 1 K/uL   Eosinophil     %   Eosinophils Absolute     0 - 0 K/uL   Basophil     %   Basophils Absolute     0 - 0 K/uL   Immature Granulocytes     %   Abs Immature Granulocytes     0.00 - 0.07 K/uL    Constipation: Last bowel movement 3 days ago. Negative for abdominal pain,N/V,or  urinary symptoms. She does not take OTC medications.  DM II: FG 90's-low 100's. Negative for polydipsia,polyuria, or polyphagia.  Lab Results  Component Value Date   HGBA1C 7.6 (H) 03/24/2020  She is on Ozempic 1 G weekly and Toujeo 30 U daily.  Review of Systems  Constitutional: Negative for activity change, appetite change, fatigue and fever.  HENT: Negative for mouth sores, nosebleeds and sore throat.   Respiratory: Negative for cough and wheezing.   Gastrointestinal: Negative for vomiting.  Genitourinary: Negative for difficulty urinating, dysuria and hematuria.  Neurological: Negative for syncope, facial asymmetry and weakness.  Psychiatric/Behavioral: Negative for confusion. The patient is not nervous/anxious.   Rest of ROS, see pertinent positives sand negatives in HPI  No current facility-administered medications on file prior to visit.   Current Outpatient Medications on File Prior to Visit  Medication Sig Dispense Refill  . Alcohol Swabs (B-D SINGLE USE SWABS  REGULAR) PADS Use to test blood sugar up to 3 times daily 100 each 3  . allopurinol (ZYLOPRIM) 100 MG tablet Take 1 tablet (100 mg total) by mouth daily. 30 tablet 3  . apixaban (ELIQUIS) 5 MG TABS tablet Take 1 tablet (5 mg total) by mouth 2 (two) times daily. 60 tablet 11  . Ascorbic Acid (VITA-C PO) Take 1 tablet by mouth daily.     Marland Kitchen atorvastatin (LIPITOR) 20 MG tablet Take 1 tablet (20 mg total) by mouth daily. (Patient taking differently: Take 20 mg by mouth at bedtime. ) 90 tablet 2  . Blood Glucose Monitoring Suppl (ACCU-CHEK GUIDE) w/Device KIT 1 Device by Does not apply route 3 (three) times daily. 1 kit 0  . clotrimazole-betamethasone (LOTRISONE) cream Apply 1 application topically 2 (two) times daily as needed. (Patient taking differently: Apply 1 application topically 2 (two) times daily as needed (rash). ) 30 g 1  . cyclobenzaprine (FLEXERIL) 5 MG tablet Take 1 tablet (5 mg total) by mouth daily as needed.  (Patient taking differently: Take 5 mg by mouth at bedtime. ) 30 tablet 0  . diltiazem (CARDIZEM CD) 180 MG 24 hr capsule Take 1 capsule (180 mg total) by mouth daily. 30 capsule 11  . fluticasone (FLONASE) 50 MCG/ACT nasal spray Place 1 spray into both nostrils daily as needed for allergies or rhinitis.    . furosemide (LASIX) 20 MG tablet Take 1 tablet (20 mg total) by mouth daily. 90 tablet 1  . gabapentin (NEURONTIN) 600 MG tablet Take 1 tablet by mouth twice daily (Patient taking differently: Take 600 mg by mouth 2 (two) times daily. ) 60 tablet 0  . glucose blood (ACCU-CHEK GUIDE) test strip Use to test blood sugar up to 3 times daily 100 each 3  . Insulin Glargine, 1 Unit Dial, (TOUJEO SOLOSTAR) 300 UNIT/ML SOPN Inject 35 Units into the skin daily. (Patient taking differently: Inject 30 Units into the skin daily as needed (CBG >150). ) 9 mL 3  . Iron-Vitamins (GERITOL COMPLETE PO) Take 1 tablet by mouth daily.    Marland Kitchen lactulose (CHRONULAC) 10 GM/15ML solution Take 20 g by mouth daily as needed for mild constipation.     . Lancets Misc. (ACCU-CHEK FASTCLIX LANCET) KIT Use to test blood sugar up to 3 times daily 1 kit 1  . levothyroxine (SYNTHROID) 150 MCG tablet Take 150 mcg by mouth daily before breakfast.     . loratadine (CLARITIN) 10 MG tablet Take 1 tablet (10 mg total) by mouth daily as needed. (Patient taking differently: Take 10 mg by mouth daily as needed for allergies. ) 90 tablet 0  . magnesium oxide (MAG-OX) 400 (241.3 Mg) MG tablet Take 1 tablet by mouth once daily (Patient not taking: Reported on 05/03/2020) 90 tablet 0  . meclizine (ANTIVERT) 25 MG tablet Take 25 mg by mouth daily as needed for dizziness.     . Omega-3 Fatty Acids (FISH OIL) 1000 MG CAPS Take 1,000 mg by mouth daily.     Marland Kitchen omeprazole (PRILOSEC) 40 MG capsule Take 1 capsule (40 mg total) by mouth daily. (Patient taking differently: Take 40 mg by mouth daily before breakfast. ) 30 capsule 3  . OZEMPIC, 1 MG/DOSE, 2  MG/1.5ML SOPN Inject 0.75 mLs (1 mg total) into the skin once a week. (Patient taking differently: Inject 1 mg into the skin every Wednesday. ) 3 mL 0  . Propylene Glycol (SYSTANE BALANCE OP) Place 1 drop into both eyes daily as  needed (for dry eyes).    . terazosin (HYTRIN) 5 MG capsule Take 1 capsule (5 mg total) by mouth daily. (Patient taking differently: Take 5 mg by mouth at bedtime. ) 90 capsule 1    Past Medical History:  Diagnosis Date  . (HFpEF) heart failure with preserved ejection fraction ([place])   . Back pain   . Diabetes mellitus without complication ([place])   . Dyspnea   . GERD (gastroesophageal reflux disease)   . H/O echocardiogram    a. 01/2015 Echo: EF 55-60%, Gr 2 DD, mod LVH, mildly dil LA.  Marland Kitchen Heart disease   . Hypertension   . Hypothyroidism   . Joint pain   . Kidney problem   . Lower extremity edema   . Mini stroke ([place])   . Non-obstructive CAD    a. 01/2015 Cardiolite: + inf wall ischemia, EF 56%;  b. 01/2015 Cath: LM nl, LAD 61m LCX min irregs, RCA dominant, 50-660m . Sleep apnea   . Swallowing difficulty   . Thyroid disease    Allergies  Allergen Reactions  . Ace Inhibitors Other (See Comments)    unknown  . Amlodipine Other (See Comments)    unknown  . Atenolol Other (See Comments)    bradycardia  . Avandia [Rosiglitazone] Other (See Comments)    unknown  . Darvon [Propoxyphene] Other (See Comments)    unknown  . Erythromycin Itching  . Hydralazine Other (See Comments)    Burning in throat and chest  . Hydrocodone Other (See Comments)    No reaction per patient, but ineffective  . Levofloxacin Itching  . Morphine And Related Other (See Comments)    Dizzy and hallucianation, vomiting; Willing to try low dose  . Percocet [Oxycodone-Acetaminophen] Other (See Comments)    hallucination  . Spironolactone Other (See Comments)    unknown  . Tramadol Other (See Comments)    Unknown/does not recall reaction but does not want to take again     Social History   Socioeconomic History  . Marital status: Widowed    Spouse name: Not on file  . Number of children: Not on file  . Years of education: Not on file  . Highest education level: Not on file  Occupational History  . Occupation: Retired  Tobacco Use  . Smoking status: Never Smoker  . Smokeless tobacco: Never Used  Vaping Use  . Vaping Use: Never used  Substance and Sexual Activity  . Alcohol use: No  . Drug use: Not on file  . Sexual activity: Not Currently  Other Topics Concern  . Not on file  Social History Narrative   Lives with daughter   Social Determinants of Health   Financial Resource Strain:   . Difficulty of Paying Living Expenses:   Food Insecurity:   . Worried About RuCharity fundraisern the Last Year:   . RaArboriculturistn the Last Year:   Transportation Needs:   . LaFilm/video editorMedical):   . Marland Kitchenack of Transportation (Non-Medical):   Physical Activity:   . Days of Exercise per Week:   . Minutes of Exercise per Session:   Stress:   . Feeling of Stress :   Social Connections:   . Frequency of Communication with Friends and Family:   . Frequency of Social Gatherings with Friends and Family:   . Attends Religious Services:   . Active Member of Clubs or Organizations:   . Attends ClArchivisteetings:   .  Marital Status:     Vitals:   05/01/20 1532  BP: 134/80  Pulse: 98  Resp: 16  SpO2: 97%   Body mass index is 41.34 kg/m.  Physical Exam Vitals and nursing note reviewed.  Constitutional:      General: She is not in acute distress.    Appearance: She is well-developed.  HENT:     Head: Normocephalic and atraumatic.     Mouth/Throat:     Mouth: Mucous membranes are moist.     Pharynx: Oropharynx is clear.  Eyes:     Conjunctiva/sclera: Conjunctivae normal.     Pupils: Pupils are equal, round, and reactive to light.  Cardiovascular:     Rate and Rhythm: Tachycardia present. Rhythm irregularly irregular.      Pulses:          Dorsalis pedis pulses are 2+ on the right side and 2+ on the left side.     Heart sounds: No murmur heard.      Comments: Trace pitting LE edema,bilateral. HR: 118/min Pulmonary:     Effort: Pulmonary effort is normal. No respiratory distress.     Breath sounds: Normal breath sounds.  Abdominal:     Palpations: Abdomen is soft. There is no hepatomegaly or mass.     Tenderness: There is no abdominal tenderness.  Lymphadenopathy:     Cervical: No cervical adenopathy.  Skin:    General: Skin is warm.     Findings: No erythema or rash.  Neurological:     Mental Status: She is alert and oriented to person, place, and time.     Cranial Nerves: No cranial nerve deficit.     Gait: Gait normal.  Psychiatric:     Comments: Well groomed, good eye contact.     ASSESSMENT AND PLAN:  Regina [name] was seen today for hospitalization follow up.  Orders Placed This Encounter  Procedures  . Pneumococcal polysaccharide vaccine 23-valent greater than or equal to 2yo subcutaneous/IM    Type 2 diabetes mellitus with diabetic neuropathy, unspecified (HCC) HgA1C mildy above 7.0. No changes for now. She has changed her diet and losing wt. F/U in 3-4 months.  Hypothyroidism No changes in Levothyroxine dose. Will plan on re-checking next visit.  Essential hypertension BP adequately controlled. No changes in current management. Continue monitoring BP at home.  CKD (chronic kidney disease), stage III ([place]) Keep appt with nephrologist, which according to pt,is being arranged. For now we are not going to resume Losartan. Adequate hydration and low salt diet. Avoid NSAID's.  Atrial fibrillation with RVR (HCC) Not rate nor rhythm controlled. Continue monitoring HR. She has an appt with cardiologist next week. Diltiazem and eliquis side effects discussed. Instructed about warning signs.  Constipation, unspecified constipation type Adequate hydration and  fiber intake. OTC miralax or colace daily as needed recommended. We can consider Linzess if still having problems.  Need for pneumococcal vaccination - Pneumococcal polysaccharide vaccine 23-valent greater than or equal to 2yo subcutaneous/IM   Return in about 6 weeks (around 06/12/2020).   Veera Stapleton G. [name], MD  [facility]. [place] office.   A few things to remember from today's visit:  For now no changes in thyroid med, we will plan on checking next visit, 4-6 weeks. Miralax or colace daily as needed. Keep with cardio. Monitor blood pressure and heart rate.  If you need refills please call your pharmacy. Do not use My Chart to request refills or for acute issues that need immediate  attention.   Please be sure medication list is accurate. If a new problem present, please set up appointment sooner than planned today.

## 2020-05-01 NOTE — Assessment & Plan Note (Signed)
No changes in Levothyroxine dose. Will plan on re-checking next visit.

## 2020-05-01 NOTE — Patient Instructions (Signed)
A few things to remember from today's visit:  For now no changes in thyroid med, we will plan on checking next visit, 4-6 weeks. Miralax or colace daily as needed. Keep with cardio. Monitor blood pressure and heart rate.  If you need refills please call your pharmacy. Do not use My Chart to request refills or for acute issues that need immediate attention.   Please be sure medication list is accurate. If a new problem present, please set up appointment sooner than planned today.

## 2020-05-01 NOTE — Assessment & Plan Note (Signed)
HgA1C mildy above 7.0. No changes for now. She has changed her diet and losing wt. F/U in 3-4 months.

## 2020-05-01 NOTE — Assessment & Plan Note (Signed)
BP adequately controlled. No changes in current management. Continue monitoring BP at home. 

## 2020-05-03 ENCOUNTER — Encounter (HOSPITAL_COMMUNITY): Payer: Self-pay | Admitting: Emergency Medicine

## 2020-05-03 ENCOUNTER — Inpatient Hospital Stay (HOSPITAL_COMMUNITY)
Admission: EM | Admit: 2020-05-03 | Discharge: 2020-05-07 | DRG: 309 | Disposition: A | Discharge status: Home / Self Care | Payer: Medicare Other | Attending: Cardiology | Admitting: Cardiology

## 2020-05-03 ENCOUNTER — Other Ambulatory Visit: Payer: Self-pay

## 2020-05-03 ENCOUNTER — Telehealth: Payer: Self-pay | Admitting: Physician Assistant

## 2020-05-03 DIAGNOSIS — I10 Essential (primary) hypertension: Secondary | ICD-10-CM | POA: Diagnosis not present

## 2020-05-03 DIAGNOSIS — I4891 Unspecified atrial fibrillation: Secondary | ICD-10-CM

## 2020-05-03 DIAGNOSIS — K219 Gastro-esophageal reflux disease without esophagitis: Secondary | ICD-10-CM | POA: Diagnosis not present

## 2020-05-03 DIAGNOSIS — E890 Postprocedural hypothyroidism: Secondary | ICD-10-CM | POA: Diagnosis not present

## 2020-05-03 DIAGNOSIS — I4819 Other persistent atrial fibrillation: Secondary | ICD-10-CM | POA: Diagnosis not present

## 2020-05-03 DIAGNOSIS — Z8673 Personal history of transient ischemic attack (TIA), and cerebral infarction without residual deficits: Secondary | ICD-10-CM | POA: Diagnosis not present

## 2020-05-03 DIAGNOSIS — I248 Other forms of acute ischemic heart disease: Secondary | ICD-10-CM | POA: Diagnosis not present

## 2020-05-03 DIAGNOSIS — I130 Hypertensive heart and chronic kidney disease with heart failure and stage 1 through stage 4 chronic kidney disease, or unspecified chronic kidney disease: Secondary | ICD-10-CM | POA: Diagnosis present

## 2020-05-03 DIAGNOSIS — I5032 Chronic diastolic (congestive) heart failure: Secondary | ICD-10-CM

## 2020-05-03 DIAGNOSIS — N1832 Chronic kidney disease, stage 3b: Secondary | ICD-10-CM | POA: Diagnosis not present

## 2020-05-03 DIAGNOSIS — Z7901 Long term (current) use of anticoagulants: Secondary | ICD-10-CM | POA: Diagnosis not present

## 2020-05-03 DIAGNOSIS — I5033 Acute on chronic diastolic (congestive) heart failure: Secondary | ICD-10-CM

## 2020-05-03 DIAGNOSIS — E782 Mixed hyperlipidemia: Secondary | ICD-10-CM | POA: Diagnosis not present

## 2020-05-03 DIAGNOSIS — Z791 Long term (current) use of non-steroidal anti-inflammatories (NSAID): Secondary | ICD-10-CM

## 2020-05-03 DIAGNOSIS — Z794 Long term (current) use of insulin: Secondary | ICD-10-CM | POA: Diagnosis not present

## 2020-05-03 DIAGNOSIS — Z90710 Acquired absence of both cervix and uterus: Secondary | ICD-10-CM

## 2020-05-03 DIAGNOSIS — Z96611 Presence of right artificial shoulder joint: Secondary | ICD-10-CM | POA: Diagnosis present

## 2020-05-03 DIAGNOSIS — E1122 Type 2 diabetes mellitus with diabetic chronic kidney disease: Secondary | ICD-10-CM | POA: Diagnosis present

## 2020-05-03 DIAGNOSIS — Z7982 Long term (current) use of aspirin: Secondary | ICD-10-CM

## 2020-05-03 DIAGNOSIS — Z79890 Hormone replacement therapy (postmenopausal): Secondary | ICD-10-CM

## 2020-05-03 DIAGNOSIS — E1140 Type 2 diabetes mellitus with diabetic neuropathy, unspecified: Secondary | ICD-10-CM | POA: Diagnosis present

## 2020-05-03 DIAGNOSIS — Z20822 Contact with and (suspected) exposure to covid-19: Secondary | ICD-10-CM | POA: Diagnosis present

## 2020-05-03 DIAGNOSIS — I513 Intracardiac thrombosis, not elsewhere classified: Secondary | ICD-10-CM | POA: Diagnosis not present

## 2020-05-03 DIAGNOSIS — D649 Anemia, unspecified: Secondary | ICD-10-CM | POA: Diagnosis present

## 2020-05-03 DIAGNOSIS — I2510 Atherosclerotic heart disease of native coronary artery without angina pectoris: Secondary | ICD-10-CM | POA: Diagnosis not present

## 2020-05-03 DIAGNOSIS — I5043 Acute on chronic combined systolic (congestive) and diastolic (congestive) heart failure: Secondary | ICD-10-CM | POA: Diagnosis not present

## 2020-05-03 DIAGNOSIS — I5042 Chronic combined systolic (congestive) and diastolic (congestive) heart failure: Secondary | ICD-10-CM | POA: Diagnosis not present

## 2020-05-03 DIAGNOSIS — Z6841 Body Mass Index (BMI) 40.0 and over, adult: Secondary | ICD-10-CM

## 2020-05-03 DIAGNOSIS — Z79899 Other long term (current) drug therapy: Secondary | ICD-10-CM | POA: Diagnosis not present

## 2020-05-03 DIAGNOSIS — E859 Amyloidosis, unspecified: Secondary | ICD-10-CM | POA: Diagnosis present

## 2020-05-03 HISTORY — DX: Unspecified diastolic (congestive) heart failure: I50.30

## 2020-05-03 LAB — SARS CORONAVIRUS 2 BY RT PCR (HOSPITAL ORDER, PERFORMED IN ~~LOC~~ HOSPITAL LAB): SARS Coronavirus 2: NEGATIVE

## 2020-05-03 LAB — CBC
HCT: 30.7 % — ABNORMAL LOW (ref 36.0–46.0)
Hemoglobin: 10 g/dL — ABNORMAL LOW (ref 12.0–15.0)
MCH: 29 pg (ref 26.0–34.0)
MCHC: 32.6 g/dL (ref 30.0–36.0)
MCV: 89 fL (ref 80.0–100.0)
Platelets: 287 10*3/uL (ref 150–400)
RBC: 3.45 MIL/uL — ABNORMAL LOW (ref 3.87–5.11)
RDW: 14.9 % (ref 11.5–15.5)
WBC: 7.9 10*3/uL (ref 4.0–10.5)
nRBC: 0 % (ref 0.0–0.2)

## 2020-05-03 LAB — BASIC METABOLIC PANEL
Anion gap: 9 (ref 5–15)
BUN: 41 mg/dL — ABNORMAL HIGH (ref 8–23)
CO2: 25 mmol/L (ref 22–32)
Calcium: 9 mg/dL (ref 8.9–10.3)
Chloride: 99 mmol/L (ref 98–111)
Creatinine, Ser: 1.94 mg/dL — ABNORMAL HIGH (ref 0.44–1.00)
GFR calc Af Amer: 28 mL/min — ABNORMAL LOW (ref >60)
GFR calc non Af Amer: 25 mL/min — ABNORMAL LOW (ref >60)
Glucose, Bld: 227 mg/dL — ABNORMAL HIGH (ref 70–99)
Potassium: 4 mmol/L (ref 3.5–5.1)
Sodium: 133 mmol/L — ABNORMAL LOW (ref 135–145)

## 2020-05-03 LAB — TROPONIN I (HIGH SENSITIVITY)
Troponin I (High Sensitivity): 78 ng/L — ABNORMAL HIGH (ref <18)
Troponin I (High Sensitivity): 87 ng/L — ABNORMAL HIGH (ref <18)

## 2020-05-03 LAB — CBG MONITORING, ED: Glucose-Capillary: 195 mg/dL — ABNORMAL HIGH (ref 70–99)

## 2020-05-03 MED ORDER — DILTIAZEM LOAD VIA INFUSION
10.0000 mg | Freq: Once | INTRAVENOUS | Status: AC
  Administered 2020-05-03: 10 mg via INTRAVENOUS
  Filled 2020-05-03: qty 10

## 2020-05-03 MED ORDER — FUROSEMIDE 20 MG PO TABS
20.0000 mg | ORAL_TABLET | Freq: Every day | ORAL | Status: DC
  Administered 2020-05-04 – 2020-05-05 (×2): 20 mg via ORAL
  Filled 2020-05-03 (×2): qty 1

## 2020-05-03 MED ORDER — ASPIRIN EC 81 MG PO TBEC
81.0000 mg | DELAYED_RELEASE_TABLET | Freq: Every day | ORAL | Status: DC
  Administered 2020-05-03 – 2020-05-07 (×5): 81 mg via ORAL
  Filled 2020-05-03 (×5): qty 1

## 2020-05-03 MED ORDER — SODIUM CHLORIDE 0.9 % IV SOLN: INTRAVENOUS | Status: DC

## 2020-05-03 MED ORDER — INSULIN ASPART 100 UNIT/ML ~~LOC~~ SOLN: 0.0000 [IU] | Freq: Every day | SUBCUTANEOUS | Status: DC

## 2020-05-03 MED ORDER — INSULIN GLARGINE 100 UNIT/ML ~~LOC~~ SOLN
10.0000 [IU] | Freq: Every day | SUBCUTANEOUS | Status: DC
  Administered 2020-05-03 – 2020-05-06 (×3): 10 [IU] via SUBCUTANEOUS
  Filled 2020-05-03 (×6): qty 0.1

## 2020-05-03 MED ORDER — ONDANSETRON HCL 4 MG/2ML IJ SOLN: 4.0000 mg | Freq: Four times a day (QID) | INTRAMUSCULAR | Status: DC | PRN

## 2020-05-03 MED ORDER — DILTIAZEM HCL-DEXTROSE 125-5 MG/125ML-% IV SOLN (PREMIX)
5.0000 mg/h | INTRAVENOUS | Status: DC
  Administered 2020-05-03 – 2020-05-04 (×3): 5 mg/h via INTRAVENOUS
  Administered 2020-05-05: 10 mg/h via INTRAVENOUS
  Filled 2020-05-03 (×5): qty 125

## 2020-05-03 MED ORDER — ATORVASTATIN CALCIUM 10 MG PO TABS
20.0000 mg | ORAL_TABLET | Freq: Every day | ORAL | Status: DC
  Administered 2020-05-03 – 2020-05-06 (×4): 20 mg via ORAL
  Filled 2020-05-03 (×4): qty 2

## 2020-05-03 MED ORDER — INSULIN ASPART 100 UNIT/ML ~~LOC~~ SOLN
4.0000 [IU] | Freq: Three times a day (TID) | SUBCUTANEOUS | Status: DC
  Administered 2020-05-04 – 2020-05-07 (×6): 4 [IU] via SUBCUTANEOUS

## 2020-05-03 MED ORDER — APIXABAN 5 MG PO TABS
5.0000 mg | ORAL_TABLET | Freq: Two times a day (BID) | ORAL | Status: DC
  Administered 2020-05-03 – 2020-05-07 (×8): 5 mg via ORAL
  Filled 2020-05-03 (×9): qty 1

## 2020-05-03 MED ORDER — INSULIN ASPART 100 UNIT/ML ~~LOC~~ SOLN
0.0000 [IU] | Freq: Three times a day (TID) | SUBCUTANEOUS | Status: DC
  Administered 2020-05-04: 3 [IU] via SUBCUTANEOUS
  Administered 2020-05-05: 11 [IU] via SUBCUTANEOUS
  Administered 2020-05-06: 7 [IU] via SUBCUTANEOUS
  Administered 2020-05-07: 11 [IU] via SUBCUTANEOUS
  Administered 2020-05-07: 3 [IU] via SUBCUTANEOUS

## 2020-05-03 MED ORDER — SODIUM CHLORIDE 0.9% FLUSH
3.0000 mL | Freq: Once | INTRAVENOUS | Status: AC
  Administered 2020-05-04: 3 mL via INTRAVENOUS

## 2020-05-03 NOTE — ED Triage Notes (Signed)
Pt states she just wasn't feeling well this morning and vomited.  Denies chest pain and SOB.  States she took her vitals and her heart rate was 160s.  States she was in the hospital last week for elevated HR.  EKG Afib RVR.

## 2020-05-03 NOTE — ED Notes (Signed)
Pt CBG 195, RN paged admitting for glycemic control orders.

## 2020-05-03 NOTE — ED Notes (Signed)
Cardiology at bedside.

## 2020-05-03 NOTE — ED Notes (Signed)
Son "Crosby" phone number [phone]

## 2020-05-03 NOTE — Telephone Encounter (Signed)
Spoke with the patient's son, patient came back with afib with RVR and failed rate control therapy. She was just discharged 1 week ago with plan to rate control and Eliquis for 3 weeks before DCCV. Since her HR cannot be easily controlled, may need to admit her and do the TEE DCCV tomorrow. Will let EDP finish initial workup and contact cardiology.

## 2020-05-03 NOTE — ED Notes (Signed)
Lab to add on troponin  

## 2020-05-03 NOTE — ED Notes (Signed)
First attempt to call report unsuccessful. 

## 2020-05-03 NOTE — ED Provider Notes (Signed)
[facility] EMERGENCY DEPARTMENT Provider Note   CSN: [id number] Arrival date & time: 05/03/20  1159     History Chief Complaint  Patient presents with  . Atrial Fibrillation    Regina [name] is a 79 y.o. female.  Patient is a 79 year old female with past medical history of nonobstructive coronary artery disease, diabetes, congestive heart failure, and paroxysmal A. fib.  Patient recently admitted for A. fib with RVR.  She was discharged on Cardizem and Eliquis.  This morning, her heart began beating rapidly and irregularly again.  She describes a heaviness in her chest and feeling short of breath.  She also had some swelling to her ankles.  The history is provided by the patient.  Atrial Fibrillation This is a recurrent problem. The problem occurs constantly. The problem has been gradually worsening. Associated symptoms include shortness of breath. Pertinent negatives include no chest pain. Nothing aggravates the symptoms. Nothing relieves the symptoms. She has tried nothing for the symptoms.       Past Medical History:  Diagnosis Date  . Back pain   . CHF (congestive heart failure) ([place])   . Diabetes mellitus without complication ([place])   . Dyspnea   . GERD (gastroesophageal reflux disease)   . H/O echocardiogram    a. 01/2015 Echo: EF 55-60%, Gr 2 DD, mod LVH, mildly dil LA.  Marland Kitchen Heart disease   . Hypertension   . Hypothyroidism   . Joint pain   . Kidney problem   . Lower extremity edema   . Mini stroke ([place])   . Non-obstructive CAD    a. 01/2015 Cardiolite: + inf wall ischemia, EF 56%;  b. 01/2015 Cath: LM nl, LAD 83m LCX min irregs, RCA dominant, 50-69m . Sleep apnea   . Swallowing difficulty   . Thyroid disease     Patient Active Problem List   Diagnosis Date Noted  . Atrial fibrillation with rapid ventricular response (HCSully07/06/2020  . Other fatigue 03/24/2020  . Short of breath on exertion 03/24/2020  . Congestive heart failure (HCRand 03/24/2020  . Vitamin D deficiency 03/24/2020  . Sleep apnea 05/21/2019  . History of hepatitis C 05/21/2019  . Insomnia 05/21/2019  . CKD (chronic kidney disease), stage III 05/21/2019  . GERD (gastroesophageal reflux disease) 04/20/2018  . Type 2 diabetes mellitus with diabetic neuropathy, unspecified (HCR[place]03/02/2017  . History of TIA (transient ischemic attack) 12/19/2016  . Hyperlipidemia associated with type 2 diabetes mellitus (HCChe[place]03/02/2017  . S/P total knee replacement using cement, right 03/18/2015  . Non-obstructive CAD   . Cardiovascular stress test abnormal   . Pleuritic chest pain 01/16/2015  . Acute renal failure superimposed on stage 3 chronic kidney disease ([facility]04/10/2014  . Obesity, Class III, BMI 40-49.9 (morbid obesity) (HCF[place]04/10/2014  . Hypertension associated with diabetes (HCCres[place]04/10/2014  . Hypothyroidism 01/16/2015  . OSA on CPAP 01/16/2015  . DM type 2 (diabetes mellitus, type 2) (HCL[place]04/10/2014  . Coronary artery disease involving native coronary artery without angina pectoris 10/02/2014  . Physical deconditioning 02/28/2013  . Class 3 severe obesity with serious comorbidity and body mass index (BMI) of 40.0 to 44.9 in adult (HCMich[place]05/14/2014  . Osteoarthritis 02/27/2013    Past Surgical History:  Procedure Laterality Date  . ABDOMINAL HYSTERECTOMY    . CARDIAC CATHETERIZATION    . ESOPHAGOGASTRODUODENOSCOPY     a. 01/2015 EGD: patent esophagus.  . ESOPHAGOGASTRODUODENOSCOPY N/A 01/20/2015   Procedure: ESOPHAGOGASTRODUODENOSCOPY (EGD);  Surgeon:  Carol Ada, MD;  Location: [facility] END[facility]COPY;  Service: Endoscopy;  Laterality: N/A;  . HAND SURGERY    . JOINT REPLACEMENT     reports history bilateral TKA and right TSA  . LEFT HEART CATHETERIZATION WITH CORONARY ANGIOGRAM N/A 01/19/2015  . THYROIDECTOMY       OB History    Gravida  6   Para      Term      Preterm      AB      Living        SAB      TAB      Ectopic      Multiple       Live Births              Family History  Problem Relation Age of Onset  . Heart disease Mother   . Hypertension Mother   . Cancer Father   . Alcoholism Father     Social History   Tobacco Use  . Smoking status: Never Smoker  . Smokeless tobacco: Never Used  Vaping Use  . Vaping Use: Never used  Substance Use Topics  . Alcohol use: No  . Drug use: Not on file    Home Medications Prior to Admission medications   Medication Sig Start Date End Date Taking? Authorizing Provider  Alcohol Swabs (B-D SINGLE USE SWABS REGULAR) PADS Use to test blood sugar up to 3 times daily 06/10/19   [name], [name], MD  allopurinol (ZYLOPRIM) 100 MG tablet Take 1 tablet (100 mg total) by mouth daily. 01/31/20   [name], [name], MD  apixaban (ELIQUIS) 5 MG TABS tablet Take 1 tablet (5 mg total) by mouth 2 (two) times daily. 04/26/20   Barrett, Evelene Croon, PA-C  Ascorbic Acid (VITA-C PO) Take 1 tablet by mouth daily.     [provider]  aspirin 81 MG tablet Take 81 mg by mouth daily.    [provider]  atorvastatin (LIPITOR) 20 MG tablet Take 1 tablet (20 mg total) by mouth daily. 11/13/19   [name], [name], MD  Blood Glucose Monitoring Suppl (ACCU-CHEK GUIDE) w/Device KIT 1 Device by Does not apply route 3 (three) times daily. 06/10/19   [name], [name], MD  clotrimazole-betamethasone (LOTRISONE) cream Apply 1 application topically 2 (two) times daily as needed. Patient taking differently: Apply 1 application topically 2 (two) times daily as needed (rash).  09/09/19   [name], [name], MD  cyclobenzaprine (FLEXERIL) 5 MG tablet Take 1 tablet (5 mg total) by mouth daily as needed. Patient taking differently: Take 5 mg by mouth daily as needed for muscle spasms.  04/14/20   [name], [name], MD  diltiazem (CARDIZEM CD) 180 MG 24 hr capsule Take 1 capsule (180 mg total) by mouth daily. 04/26/20   Barrett, Evelene Croon, PA-C  fluticasone (FLONASE) 50 MCG/ACT nasal spray Place 1 spray into both  nostrils daily as needed for allergies or rhinitis.    [provider]  furosemide (LASIX) 20 MG tablet Take 1 tablet (20 mg total) by mouth daily. 03/12/20   Geralynn Rile, MD  gabapentin (NEURONTIN) 600 MG tablet Take 1 tablet by mouth twice daily 03/17/20   [name], [name], MD  glucose blood (ACCU-CHEK GUIDE) test strip Use to test blood sugar up to 3 times daily 03/13/20   [name], [name], MD  Insulin Glargine, 1 Unit Dial, (TOUJEO SOLOSTAR) 300 UNIT/ML SOPN Inject 35 Units into the skin daily. Patient  taking differently: Inject 30 Units into the skin daily.  11/01/19   [name], [name], MD  Iron-Vitamins (GERITOL COMPLETE PO) Take 1 tablet by mouth daily.    [provider]  lactulose (CHRONULAC) 10 GM/15ML solution Take 20 g by mouth daily as needed for mild constipation.     [provider]  Lancets Misc. (ACCU-CHEK FASTCLIX LANCET) KIT Use to test blood sugar up to 3 times daily 03/13/20   [name], [name], MD  levothyroxine (SYNTHROID) 150 MCG tablet Take 150 mcg by mouth daily. 12/18/18   [provider]  loratadine (CLARITIN) 10 MG tablet Take 1 tablet (10 mg total) by mouth daily as needed. Patient taking differently: Take 10 mg by mouth daily as needed for allergies.  10/23/19   [name], [name], MD  magnesium oxide (MAG-OX) 400 (241.3 Mg) MG tablet Take 1 tablet by mouth once daily 04/14/20   [name], [name], MD  meclizine (ANTIVERT) 25 MG tablet Take 25 mg by mouth daily as needed for dizziness.  10/01/14   [provider]  Omega-3 Fatty Acids (FISH OIL) 1000 MG CAPS Take 1,000 mg by mouth daily.     [provider]  omeprazole (PRILOSEC) 40 MG capsule Take 1 capsule (40 mg total) by mouth daily. 11/12/19   [name], [name], MD  OZEMPIC, 1 MG/DOSE, 2 MG/1.5ML SOPN Inject 0.75 mLs (1 mg total) into the skin once a week. 04/07/20   Dennard Nip D, MD  Propylene Glycol (SYSTANE BALANCE OP) Place 1 drop into both eyes daily as needed (for dry  eyes).    [provider]  terazosin (HYTRIN) 5 MG capsule Take 1 capsule (5 mg total) by mouth daily. 01/14/20   [name], [name], MD  VITAMIN D PO Take 1 tablet by mouth daily.     [provider]    Allergies    Ace inhibitors, Amlodipine, Atenolol, Avandia [rosiglitazone], Darvon [propoxyphene], Erythromycin, Hydralazine, Hydrocodone, Levofloxacin, Morphine and related, Percocet [oxycodone-acetaminophen], Spironolactone, and Tramadol  Review of Systems   Review of Systems  Respiratory: Positive for shortness of breath.   Cardiovascular: Negative for chest pain.  All other systems reviewed and are negative.   Physical Exam Updated Vital Signs BP (!) 142/90 (BP Location: Right Arm)   Pulse (!) 124   Temp 98.4 F (36.9 C) (Oral)   Resp 16   SpO2 100%   Physical Exam Vitals and nursing note reviewed.  Constitutional:      General: She is not in acute distress.    Appearance: She is well-developed. She is not diaphoretic.  HENT:     Head: Normocephalic and atraumatic.  Cardiovascular:     Rate and Rhythm: Normal rate. Rhythm irregular.     Heart sounds: No murmur heard.  No friction rub. No gallop.      Comments: Heart is irregularly irregular and rapid Pulmonary:     Effort: Pulmonary effort is normal. No respiratory distress.     Breath sounds: Normal breath sounds. No wheezing.  Abdominal:     General: Bowel sounds are normal. There is no distension.     Palpations: Abdomen is soft.     Tenderness: There is no abdominal tenderness.  Musculoskeletal:        General: Normal range of motion.     Cervical back: Normal range of motion and neck supple.  Skin:    General: Skin is warm and dry.  Neurological:     Mental Status: She is  alert and oriented to person, place, and time.     ED Results / Procedures / Treatments   Labs (all labs ordered are listed, but only abnormal results are displayed) Labs Reviewed  BASIC METABOLIC PANEL - Abnormal;  Notable for the following components:      Result Value   Sodium 133 (*)    Glucose, Bld 227 (*)    BUN 41 (*)    Creatinine, Ser 1.94 (*)    GFR calc non Af Amer 25 (*)    GFR calc Af Amer 28 (*)    All other components within normal limits  CBC - Abnormal; Notable for the following components:   RBC 3.45 (*)    Hemoglobin 10.0 (*)    HCT 30.7 (*)    All other components within normal limits  TROPONIN I (HIGH SENSITIVITY)    EKG EKG Interpretation  Date/Time:  Sunday May 03 2020 13:02:46 EDT Ventricular Rate:  124 PR Interval:    QRS Duration: 78 QT Interval:  314 QTC Calculation: 451 R Axis:   33 Text Interpretation: Atrial fibrillation with rapid ventricular response Low voltage QRS Nonspecific T wave abnormality Abnormal ECG No significant change since prior ecg Confirmed by Veryl Speak [phone]) on 05/03/2020 1:17:10 PM   Radiology No results found.  Procedures Procedures (including critical care time)  Medications Ordered in ED Medications  sodium chloride flush (NS) 0.9 % injection 3 mL (has no administration in time range)  diltiazem (CARDIZEM) 1 mg/mL load via infusion 10 mg (has no administration in time range)    And  diltiazem (CARDIZEM) 125 mg in dextrose 5% 125 mL (1 mg/mL) infusion (has no administration in time range)    ED Course  I have reviewed the triage vital signs and the nursing notes.  Pertinent labs & imaging results that were available during my care of the patient were reviewed by me and considered in my medical decision making (see chart for details).    MDM Rules/Calculators/A&P  Patient with recent admission for atrial fibrillation started on Cardizem presenting with palpitations and rapid heartbeat.  Initial vital signs and EKG show an atrial fibrillation with rate in the 140s.    She was started on Cardizem with good rate control.  Patient's laboratory studies are unremarkable.  Care discussed with cardiology.  Dr. [name] has  evaluated the patient in the ER and will admit.  CRITICAL CARE Performed by: Veryl Speak Total critical care time: 35 minutes Critical care time was exclusive of separately billable procedures and treating other patients. Critical care was necessary to treat or prevent imminent or life-threatening deterioration. Critical care was time spent personally by me on the following activities: development of treatment plan with patient and/or surrogate as well as nursing, discussions with consultants, evaluation of patient's response to treatment, examination of patient, obtaining history from patient or surrogate, ordering and performing treatments and interventions, ordering and review of laboratory studies, ordering and review of radiographic studies, pulse oximetry and re-evaluation of patient's condition.   Final Clinical Impression(s) / ED Diagnoses Final diagnoses:  None    Rx / DC Orders ED Discharge Orders    None       Veryl Speak, MD 05/03/20 1700

## 2020-05-03 NOTE — H&P (Signed)
Cardiology Admission History and Physical:   Patient ID: [name] MRN: [id number]; DOB: 09-11-1941   Admission date: 05/03/2020  Primary Care Provider: [name], [name], MD [facility] Cardiologist: Regina Field, MD  [facility] [place] [facility] Electrophysiologist:  None   Chief Complaint: Atrial fibrillation with RVR  Patient Profile:   Regina [name] is a 79 y.o. female with past medical history of CAD, hypertension, hyperlipidemia, DM 2, CKD stage III, persistent A. fib and chronic diastolic heart failure presented with atrial fibrillation with RVR.  History of Present Illness:   Regina [name] is a 79 year old female with past medical history of CAD, hypertension, hyperlipidemia, DM 2, CKD stage III, persistent A. fib and chronic diastolic heart failure.  She had none obstructive CAD by cath in 2016.  Patient recently presented to the hospital on 04/24/2020 with new onset of atrial fibrillation with RVR.  Given her elevated CHA2DS2-Vasc score, she was started on Eliquis.  She was initially on IV diltiazem, this was later switched to 360 mg daily of oral diltiazem prior to discharge.  The original plan is to continue anticoagulation for 3 weeks before consider outpatient cardioversion.  However patient returned to the ED on 05/03/2020 due to palpitation and uncontrolled heart rate up to 160 bpm.  She says that she did take her diltiazem this morning.   Her heart rate was finally controlled in the emergency room after placing additional IV diltiazem.  Lab work shows stable renal function and electrolyte.  Troponin was borderline elevated at the 78--87.  Glucose elevated.  EKG showed atrial fibrillation with RVR.   Past Medical History:  Diagnosis Date  . Back pain   . CHF (congestive heart failure) ([place])   . Diabetes mellitus without complication ([place])   . Dyspnea   . GERD (gastroesophageal reflux disease)   . H/O echocardiogram    a. 01/2015 Echo: EF 55-60%, Gr 2 DD, mod LVH, mildly  dil LA.  Marland Kitchen Heart disease   . Hypertension   . Hypothyroidism   . Joint pain   . Kidney problem   . Lower extremity edema   . Mini stroke ([place])   . Non-obstructive CAD    a. 01/2015 Cardiolite: + inf wall ischemia, EF 56%;  b. 01/2015 Cath: LM nl, LAD 59m LCX min irregs, RCA dominant, 50-638m . Sleep apnea   . Swallowing difficulty   . Thyroid disease     Past Surgical History:  Procedure Laterality Date  . ABDOMINAL HYSTERECTOMY    . CARDIAC CATHETERIZATION    . ESOPHAGOGASTRODUODENOSCOPY     a. 01/2015 EGD: patent esophagus.  . ESOPHAGOGASTRODUODENOSCOPY N/A 01/20/2015   Procedure: ESOPHAGOGASTRODUODENOSCOPY (EGD);  Surgeon: Regina AdaMD;  [facility];  Service: Endoscopy;  Laterality: N/A;  . HAND SURGERY    . JOINT REPLACEMENT     reports history bilateral TKA and right TSA  . LEFT HEART CATHETERIZATION WITH CORONARY ANGIOGRAM N/A 01/19/2015  . THYROIDECTOMY       Medications Prior to Admission: Prior to Admission medications   Medication Sig Start Date End Date Taking? Authorizing Provider  allopurinol (ZYLOPRIM) 100 MG tablet Take 1 tablet (100 mg total) by mouth daily. 01/31/20  Yes JoMartiniqueBetty G, MD  apixaban (ELIQUIS) 5 MG TABS tablet Take 1 tablet (5 mg total) by mouth 2 (two) times daily. 04/26/20  Yes Barrett, RhEvelene CroonPA-C  Ascorbic Acid (VITA-C PO) Take 1 tablet by mouth daily.    Yes [provider]  aspirin  EC 81 MG tablet Take 81 mg by mouth daily. Swallow whole.   Yes [provider]  atorvastatin (LIPITOR) 20 MG tablet Take 1 tablet (20 mg total) by mouth daily. Patient taking differently: Take 20 mg by mouth at bedtime.  11/13/19  Yes [name], [name], MD  Cholecalciferol (VITAMIN D3) 50 MCG (2000 UT) capsule Take 2,000 Units by mouth daily.   Yes [provider]  clotrimazole-betamethasone (LOTRISONE) cream Apply 1 application topically 2 (two) times daily as needed. Patient taking differently: Apply 1 application  topically 2 (two) times daily as needed (rash).  09/09/19  Yes [name], [name], MD  cyclobenzaprine (FLEXERIL) 5 MG tablet Take 1 tablet (5 mg total) by mouth daily as needed. Patient taking differently: Take 5 mg by mouth at bedtime.  04/14/20  Yes [name], [name], MD  diclofenac Sodium (VOLTAREN) 1 % GEL Apply 1 application topically daily as needed (arthritis pain/hands).   Yes [provider]  diltiazem (CARDIZEM CD) 180 MG 24 hr capsule Take 1 capsule (180 mg total) by mouth daily. 04/26/20  Yes Barrett, Regina Croon, PA-C  fluticasone (FLONASE) 50 MCG/ACT nasal spray Place 1 spray into both nostrils daily as needed for allergies or rhinitis.   Yes [provider]  furosemide (LASIX) 20 MG tablet Take 1 tablet (20 mg total) by mouth daily. 03/12/20  Yes O'Neal, Regina Freer, MD  gabapentin (NEURONTIN) 600 MG tablet Take 1 tablet by mouth twice daily Patient taking differently: Take 600 mg by mouth 2 (two) times daily.  03/17/20  Yes [name], [name], MD  Insulin Glargine, 1 Unit Dial, (TOUJEO SOLOSTAR) 300 UNIT/ML SOPN Inject 35 Units into the skin daily. Patient taking differently: Inject 30 Units into the skin daily as needed (CBG >150).  11/01/19  Yes [name], [name], MD  Iron-Vitamins (GERITOL COMPLETE PO) Take 1 tablet by mouth daily.   Yes [provider]  lactulose (CHRONULAC) 10 GM/15ML solution Take 20 [name] by mouth daily as needed for mild constipation.    Yes [provider]  levothyroxine (SYNTHROID) 150 MCG tablet Take 150 mcg by mouth daily before breakfast.  12/18/18  Yes [provider]  loratadine (CLARITIN) 10 MG tablet Take 1 tablet (10 mg total) by mouth daily as needed. Patient taking differently: Take 10 mg by mouth daily as needed for allergies.  10/23/19  Yes [name], [name], MD  meclizine (ANTIVERT) 25 MG tablet Take 25 mg by mouth daily as needed for dizziness.  10/01/14  Yes [provider]  Omega-3 Fatty Acids (FISH OIL) 1000 MG  CAPS Take 1,000 mg by mouth daily.    Yes [provider]  omeprazole (PRILOSEC) 40 MG capsule Take 1 capsule (40 mg total) by mouth daily. Patient taking differently: Take 40 mg by mouth daily before breakfast.  11/12/19  Yes [name], [name], MD  OZEMPIC, 1 MG/DOSE, 2 MG/1.5ML SOPN Inject 0.75 mLs (1 mg total) into the skin once a week. Patient taking differently: Inject 1 mg into the skin every Wednesday.  04/07/20  Yes Beasley, Caren D, MD  Propylene Glycol (SYSTANE BALANCE OP) Place 1 drop into both eyes daily as needed (for dry eyes).   Yes [provider]  terazosin (HYTRIN) 5 MG capsule Take 1 capsule (5 mg total) by mouth daily. Patient taking differently: Take 5 mg by mouth at bedtime.  01/14/20  Yes [name], [name], MD  Alcohol Swabs (B-D SINGLE USE SWABS REGULAR) PADS Use to test blood  sugar up to 3 times daily 06/10/19   [name], [name], MD  Blood Glucose Monitoring Suppl (ACCU-CHEK GUIDE) w/Device KIT 1 Device by Does not apply route 3 (three) times daily. 06/10/19   [name], [name], MD  glucose blood (ACCU-CHEK GUIDE) test strip Use to test blood sugar up to 3 times daily 03/13/20   [name], [name], MD  Lancets Misc. (ACCU-CHEK FASTCLIX LANCET) KIT Use to test blood sugar up to 3 times daily 03/13/20   [name], [name], MD  magnesium oxide (MAG-OX) 400 (241.3 Mg) MG tablet Take 1 tablet by mouth once daily Patient not taking: Reported on 05/03/2020 04/14/20   [name], [name], MD     Allergies:    Allergies  Allergen Reactions  . Ace Inhibitors Other (See Comments)    unknown  . Amlodipine Other (See Comments)    unknown  . Atenolol Other (See Comments)    bradycardia  . Avandia [Rosiglitazone] Other (See Comments)    unknown  . Darvon [Propoxyphene] Other (See Comments)    unknown  . Erythromycin Itching  . Hydralazine Other (See Comments)    Burning in throat and chest  . Hydrocodone Other (See Comments)    No reaction per patient, but ineffective  .  Levofloxacin Itching  . Morphine And Related Other (See Comments)    Dizzy and hallucianation, vomiting; Willing to try low dose  . Percocet [Oxycodone-Acetaminophen] Other (See Comments)    hallucination  . Spironolactone Other (See Comments)    unknown  . Tramadol Other (See Comments)    Unknown/does not recall reaction but does not want to take again    Social History:   Social History   Socioeconomic History  . Marital status: Widowed    Spouse name: Not on file  . Number of children: Not on file  . Years of education: Not on file  . Highest education level: Not on file  Occupational History  . Occupation: Retired  Tobacco Use  . Smoking status: Never Smoker  . Smokeless tobacco: Never Used  Vaping Use  . Vaping Use: Never used  Substance and Sexual Activity  . Alcohol use: No  . Drug use: Not on file  . Sexual activity: Not Currently  Other Topics Concern  . Not on file  Social History Narrative   Lives with daughter   Social Determinants of Health   Financial Resource Strain:   . Difficulty of Paying Living Expenses:   Food Insecurity:   . Worried About Charity fundraiser in the Last Year:   . Arboriculturist in the Last Year:   Transportation Needs:   . Film/video editor (Medical):   Marland Kitchen Lack of Transportation (Non-Medical):   Physical Activity:   . Days of Exercise per Week:   . Minutes of Exercise per Session:   Stress:   . Feeling of Stress :   Social Connections:   . Frequency of Communication with Friends and Family:   . Frequency of Social Gatherings with Friends and Family:   . Attends Religious Services:   . Active Member of Clubs or Organizations:   . Attends Archivist Meetings:   Marland Kitchen Marital Status:   Intimate Partner Violence:   . Fear of Current or Ex-Partner:   . Emotionally Abused:   Marland Kitchen Physically Abused:   . Sexually Abused:     Family History:   The patient's family history includes Alcoholism in her father; Cancer  in her  father; Heart disease in her mother; Hypertension in her mother.    ROS:  Please see the history of present illness.  All other ROS reviewed and negative.     Physical Exam/Data:   Vitals:   05/03/20 1209 05/03/20 1304 05/03/20 1400 05/03/20 1500  BP: (!) 154/88 (!) 142/90 (!) 137/107 115/82  Pulse: (!) 166 (!) 124 99 95  Resp: 16 16 (!) 26 (!) 21  Temp: 99.7 F (37.6 C) 98.4 F (36.9 C)    TempSrc: Oral Oral    SpO2: 99% 100% 97% 93%   No intake or output data in the 24 hours ending 05/03/20 1701 Last 3 Weights 05/01/2020 04/26/2020 04/22/2020  Weight (lbs) 226 lb 221 lb 11.2 oz 219 lb  Weight (kg) 102.513 kg 100.562 kg 99.338 kg     There is no height or weight on file to calculate BMI.  General:  Well nourished, well developed, in no acute distress HEENT: normal Lymph: no adenopathy Neck: no JVD Endocrine:  No thryomegaly Vascular: No carotid bruits; FA pulses 2+ bilaterally without bruits  Cardiac:  normal S1, S2; irregularly irregular; no murmur  Lungs:  clear to auscultation bilaterally, no wheezing, rhonchi or rales  Abd: soft, nontender, no hepatomegaly  Ext: no edema Musculoskeletal:  No deformities, BUE and BLE strength normal and equal Skin: warm and dry  Neuro:  CNs 2-12 intact, no focal abnormalities noted Psych:  Normal affect    EKG:  The ECG that was done in the ED and was personally reviewed and demonstrates atrial fibrillation with RVR  Relevant CV Studies:  Echo 04/03/2020 1. Left ventricular ejection fraction, by estimation, is 55 to 60%. The  left ventricle has normal function. The left ventricle has no regional  wall motion abnormalities. There is moderate concentric left ventricular  hypertrophy. Left ventricular  diastolic parameters are consistent with Grade II diastolic dysfunction  (pseudonormalization). Elevated left atrial pressure.  2. Right ventricular systolic function is normal. The right ventricular  size is normal. There is  normal pulmonary artery systolic pressure.  3. Left atrial size was mildly dilated.  4. The mitral valve is normal in structure. Mild mitral valve  regurgitation. No evidence of mitral stenosis.  5. The aortic valve is normal in structure. Aortic valve regurgitation is  trivial. No aortic stenosis is present.  6. The inferior vena cava is normal in size with greater than 50%  respiratory variability, suggesting right atrial pressure of 3 mmHg.   Laboratory Data:  High Sensitivity Troponin:   Recent Labs  Lab 04/24/20 1857 04/24/20 2055 05/03/20 1220  TROPONINIHS 211* 179* 78*      Chemistry Recent Labs  Lab 05/03/20 1220  NA 133*  K 4.0  CL 99  CO2 25  GLUCOSE 227*  BUN 41*  CREATININE 1.94*  CALCIUM 9.0  GFRNONAA 25*  GFRAA 28*  ANIONGAP 9    No results for input(s): PROT, ALBUMIN, AST, ALT, ALKPHOS, BILITOT in the last 168 hours. Hematology Recent Labs  Lab 05/03/20 1220  WBC 7.9  RBC 3.45*  HGB 10.0*  HCT 30.7*  MCV 89.0  MCH 29.0  MCHC 32.6  RDW 14.9  PLT 287   BNPNo results for input(s): BNP, PROBNP in the last 168 hours.  DDimer No results for input(s): DDIMER in the last 168 hours.   Radiology/Studies:  No results found.   Assessment and Plan:   1. Atrial fibrillation with RVR:  -She was recently discharged on 360 mg daily of  diltiazem CD and Eliquis.  Despite compliance with diltiazem, her heart rate was 160 bpm prior to arrival today.  -While delusional plan is for anticoagulation for 3 weeks prior to cardioversion, given poorly controlled heart rate, will plan for TEE DCCV tomorrow.  2. CAD: Denies any recent chest pain  3. Hypertension: Blood pressure stable on current therapy  4. Hyperlipidemia: On Lipitor  5. DM 2: Sliding scale insulin  6. CKD stage III: Stable on lab work  7. Chronic diastolic heart failure: Euvolemic on physical exam.  8. Elevated troponin: Likely demand ischemia  Severity of Illness: The appropriate  patient status for this patient is OBSERVATION. Observation status is judged to be reasonable and necessary in order to provide the required intensity of service to ensure the patient's safety. The patient's presenting symptoms, physical exam findings, and initial radiographic and laboratory data in the context of their medical condition is felt to place them at decreased risk for further clinical deterioration. Furthermore, it is anticipated that the patient will be medically stable for discharge from the hospital within 2 midnights of admission. The following factors support the patient status of observation.   " The patient's presenting symptoms include palpitation. " The physical exam findings include benign. " The initial radiographic and laboratory data are atrial fibrillation with RVR.     For questions or updates, please contact [place] Please consult [email or web address] for contact info under     Hilbert Corrigan, [place]  05/03/2020 5:01 PM

## 2020-05-03 NOTE — ED Notes (Signed)
Second attempt to call report unsuccessful.  

## 2020-05-04 ENCOUNTER — Encounter (HOSPITAL_COMMUNITY): Payer: Self-pay | Admitting: Cardiology

## 2020-05-04 DIAGNOSIS — N183 Chronic kidney disease, stage 3 unspecified: Secondary | ICD-10-CM | POA: Diagnosis not present

## 2020-05-04 DIAGNOSIS — E1122 Type 2 diabetes mellitus with diabetic chronic kidney disease: Secondary | ICD-10-CM | POA: Diagnosis not present

## 2020-05-04 DIAGNOSIS — E1140 Type 2 diabetes mellitus with diabetic neuropathy, unspecified: Secondary | ICD-10-CM | POA: Diagnosis present

## 2020-05-04 DIAGNOSIS — E782 Mixed hyperlipidemia: Secondary | ICD-10-CM

## 2020-05-04 DIAGNOSIS — I2510 Atherosclerotic heart disease of native coronary artery without angina pectoris: Secondary | ICD-10-CM | POA: Diagnosis present

## 2020-05-04 DIAGNOSIS — N1832 Chronic kidney disease, stage 3b: Secondary | ICD-10-CM | POA: Diagnosis present

## 2020-05-04 DIAGNOSIS — Z6841 Body Mass Index (BMI) 40.0 and over, adult: Secondary | ICD-10-CM | POA: Diagnosis not present

## 2020-05-04 DIAGNOSIS — Z79899 Other long term (current) drug therapy: Secondary | ICD-10-CM | POA: Diagnosis not present

## 2020-05-04 DIAGNOSIS — I4891 Unspecified atrial fibrillation: Secondary | ICD-10-CM | POA: Diagnosis not present

## 2020-05-04 DIAGNOSIS — I5033 Acute on chronic diastolic (congestive) heart failure: Secondary | ICD-10-CM

## 2020-05-04 DIAGNOSIS — I5043 Acute on chronic combined systolic (congestive) and diastolic (congestive) heart failure: Secondary | ICD-10-CM | POA: Diagnosis not present

## 2020-05-04 DIAGNOSIS — I130 Hypertensive heart and chronic kidney disease with heart failure and stage 1 through stage 4 chronic kidney disease, or unspecified chronic kidney disease: Secondary | ICD-10-CM | POA: Diagnosis not present

## 2020-05-04 DIAGNOSIS — E890 Postprocedural hypothyroidism: Secondary | ICD-10-CM | POA: Diagnosis present

## 2020-05-04 DIAGNOSIS — E859 Amyloidosis, unspecified: Secondary | ICD-10-CM | POA: Diagnosis present

## 2020-05-04 DIAGNOSIS — I5032 Chronic diastolic (congestive) heart failure: Secondary | ICD-10-CM

## 2020-05-04 DIAGNOSIS — Z8673 Personal history of transient ischemic attack (TIA), and cerebral infarction without residual deficits: Secondary | ICD-10-CM | POA: Diagnosis not present

## 2020-05-04 DIAGNOSIS — I5042 Chronic combined systolic (congestive) and diastolic (congestive) heart failure: Secondary | ICD-10-CM | POA: Diagnosis not present

## 2020-05-04 DIAGNOSIS — Z794 Long term (current) use of insulin: Secondary | ICD-10-CM | POA: Diagnosis not present

## 2020-05-04 DIAGNOSIS — Z791 Long term (current) use of non-steroidal anti-inflammatories (NSAID): Secondary | ICD-10-CM | POA: Diagnosis not present

## 2020-05-04 DIAGNOSIS — K219 Gastro-esophageal reflux disease without esophagitis: Secondary | ICD-10-CM | POA: Diagnosis present

## 2020-05-04 DIAGNOSIS — Z7901 Long term (current) use of anticoagulants: Secondary | ICD-10-CM | POA: Diagnosis not present

## 2020-05-04 DIAGNOSIS — Z7982 Long term (current) use of aspirin: Secondary | ICD-10-CM | POA: Diagnosis not present

## 2020-05-04 DIAGNOSIS — D649 Anemia, unspecified: Secondary | ICD-10-CM | POA: Diagnosis present

## 2020-05-04 DIAGNOSIS — Z20822 Contact with and (suspected) exposure to covid-19: Secondary | ICD-10-CM | POA: Diagnosis not present

## 2020-05-04 DIAGNOSIS — I4819 Other persistent atrial fibrillation: Secondary | ICD-10-CM | POA: Diagnosis not present

## 2020-05-04 DIAGNOSIS — Z90710 Acquired absence of both cervix and uterus: Secondary | ICD-10-CM | POA: Diagnosis not present

## 2020-05-04 DIAGNOSIS — I10 Essential (primary) hypertension: Secondary | ICD-10-CM | POA: Diagnosis not present

## 2020-05-04 DIAGNOSIS — Z79890 Hormone replacement therapy (postmenopausal): Secondary | ICD-10-CM | POA: Diagnosis not present

## 2020-05-04 DIAGNOSIS — I509 Heart failure, unspecified: Secondary | ICD-10-CM | POA: Diagnosis not present

## 2020-05-04 DIAGNOSIS — I248 Other forms of acute ischemic heart disease: Secondary | ICD-10-CM

## 2020-05-04 DIAGNOSIS — Z96611 Presence of right artificial shoulder joint: Secondary | ICD-10-CM | POA: Diagnosis present

## 2020-05-04 HISTORY — PX: TEE WITHOUT CARDIOVERSION: SHX5443

## 2020-05-04 LAB — BASIC METABOLIC PANEL
Anion gap: 9 (ref 5–15)
BUN: 36 mg/dL — ABNORMAL HIGH (ref 8–23)
CO2: 27 mmol/L (ref 22–32)
Calcium: 9 mg/dL (ref 8.9–10.3)
Chloride: 100 mmol/L (ref 98–111)
Creatinine, Ser: 1.85 mg/dL — ABNORMAL HIGH (ref 0.44–1.00)
GFR calc Af Amer: 30 mL/min — ABNORMAL LOW (ref >60)
GFR calc non Af Amer: 26 mL/min — ABNORMAL LOW (ref >60)
Glucose, Bld: 174 mg/dL — ABNORMAL HIGH (ref 70–99)
Potassium: 3.6 mmol/L (ref 3.5–5.1)
Sodium: 136 mmol/L (ref 135–145)

## 2020-05-04 LAB — PROTIME-INR
INR: 1.4 — ABNORMAL HIGH (ref 0.8–1.2)
Prothrombin Time: 16.9 seconds — ABNORMAL HIGH (ref 11.4–15.2)

## 2020-05-04 LAB — GLUCOSE, CAPILLARY
Glucose-Capillary: 128 mg/dL — ABNORMAL HIGH (ref 70–99)
Glucose-Capillary: 116 mg/dL — ABNORMAL HIGH (ref 70–99)
Glucose-Capillary: 114 mg/dL — ABNORMAL HIGH (ref 70–99)
Glucose-Capillary: 102 mg/dL — ABNORMAL HIGH (ref 70–99)
Glucose-Capillary: 136 mg/dL — ABNORMAL HIGH (ref 70–99)

## 2020-05-04 MED ORDER — OFF THE BEAT BOOK
Freq: Once | Status: DC
  Filled 2020-05-04: qty 1

## 2020-05-04 NOTE — Progress Notes (Addendum)
Progress Note  Patient Name: Regina [name] Date of Encounter: 05/04/2020  Primary Cardiologist: Evalina Field, MD  Subjective   Feels well this morning.  No chest pain, shortness of breath, or palpitations.  Remains in atrial fibrillation, which is rate controlled in the 70s to 80s on IV diltiazem at 5 mg/h.  She is n.p.o. with plan for TEE and cardioversion today.  Inpatient Medications    Scheduled Meds: . apixaban  5 mg Oral BID  . aspirin EC  81 mg Oral Daily  . atorvastatin  20 mg Oral QHS  . furosemide  20 mg Oral Daily  . insulin aspart  0-20 Units Subcutaneous TID WC  . insulin aspart  0-5 Units Subcutaneous QHS  . insulin aspart  4 Units Subcutaneous TID WC  . insulin glargine  10 Units Subcutaneous QHS  . off the beat book   Does not apply Once  . sodium chloride flush  3 mL Intravenous Once   Continuous Infusions: . sodium chloride    . diltiazem (CARDIZEM) infusion 5 mg/hr (05/03/20 2228)   PRN Meds: ondansetron (ZOFRAN) IV   Vital Signs    Vitals:   05/03/20 2224 05/04/20 0124 05/04/20 0243 05/04/20 0657  BP: (!) 141/66  (!) 147/73 121/72  Pulse: (!) 44  86 77  Resp: 20 16 16 17   Temp: 97.9 F (36.6 C)  97.9 F (36.6 C) 98.1 F (36.7 C)  TempSrc: Oral  Oral Oral  SpO2: 94% 98% 99% 96%  Weight:   100.5 kg     Intake/Output Summary (Last 24 hours) at 05/04/2020 0804 Last data filed at 05/04/2020 0300 Gross per 24 hour  Intake 295.8 ml  Output 1200 ml  Net -904.2 ml   Filed Weights   05/04/20 0243  Weight: 100.5 kg    Physical Exam   GEN: Obese, in no acute distress.  HEENT: Grossly normal.  Neck: Supple, obese, difficult to gauge JVP.  No carotid bruits, or masses. Cardiac: Irregularly irregular, no murmurs, rubs, or gallops. No clubbing, cyanosis, edema.  Radials/DP/PT 2+ and equal bilaterally.  Respiratory:  Respirations regular and unlabored, clear to auscultation bilaterally. GI: Soft, nontender, nondistended, BS + x 4. MS: no  deformity or atrophy. Skin: warm and dry, no rash. Neuro:  Strength and sensation are intact. Psych: AAOx3.  Normal affect.  Labs    Chemistry Recent Labs  Lab 05/03/20 1220 05/04/20 0004  NA 133* 136  K 4.0 3.6  CL 99 100  CO2 25 27  GLUCOSE 227* 174*  BUN 41* 36*  CREATININE 1.94* 1.85*  CALCIUM 9.0 9.0  GFRNONAA 25* 26*  GFRAA 28* 30*  ANIONGAP 9 9     Hematology Recent Labs  Lab 05/03/20 1220  WBC 7.9  RBC 3.45*  HGB 10.0*  HCT 30.7*  MCV 89.0  MCH 29.0  MCHC 32.6  RDW 14.9  PLT 287    Cardiac Enzymes  Recent Labs  Lab 04/24/20 1857 04/24/20 2055 05/03/20 1220 05/03/20 1559  TROPONINIHS 211* 179* 78* 87*      Lab Results  Component Value Date   LDLCALC 49 03/24/2020     Radiology    No results found.  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 (Normal SPEP/UPEP 7.6.2021) _____________  2D Echocardiogram 6.18.2021   1. Left ventricular ejection fraction, by estimation, is 55 to 60%. The  left ventricle has normal function. The left ventricle has no regional  wall motion abnormalities. There is moderate concentric left ventricular  hypertrophy. Left ventricular  diastolic parameters are consistent with Grade II diastolic dysfunction  (pseudonormalization). Elevated left atrial pressure.   2. Right ventricular systolic function is normal. The right ventricular  size is normal. There is normal pulmonary artery systolic pressure.   3. Left atrial size was mildly dilated.   4. The mitral valve is normal in structure. Mild mitral valve  regurgitation. No evidence of mitral stenosis.   5. The aortic valve is normal in structure. Aortic valve regurgitation is  trivial. No aortic stenosis is present.   6. The inferior vena cava is normal in size with greater than 50%  respiratory variability, suggesting  right atrial pressure of 3 mmHg.  _____________   Cardiac Catheterization 4.4.2016  HEMODYNAMICS:  Aortic pressure 152/57 mmHg; LV pressure 166/9 mmHg; LVEDP 19 mmHg  ANGIOGRAPHIC DATA:   The left main coronary artery is normal.  The left anterior descending artery is large and wraps around the left ventricular apex. There is mid eccentric 50% eccentric narrowing after the origin of the first large diagonal. A significant stenosis is not felt to be present..  The left circumflex artery is large and free of any significant obstruction. 3 large obtuse marginals arise from the circumflex. Minimal luminal irregularities are noted..  The right coronary artery is dominant and contains mid vessel eccentric 50-60% narrowing.  LEFT VENTRICULOGRAM:  Left ventricular angiogram was not performed. Pressures were recorded.   Patient Profile     79 y.o. female with a history of nonobstructive CAD, hypertension, hyperlipidemia, type 2 diabetes mellitus, stage III chronic kidney disease, HFpEF with abnormal PYP scan (normal UPEP/SPEP), and recent diagnosis of persistent atrial fibrillation, who was admitted July 18 with ongoing symptomatic atrial fibrillation.  Assessment & Plan    1.  Persistent atrial fibrillation with rapid ventricular response: Patient admitted July 9 with A. fib with RVR, which was rate controlled with oral diltiazem with plan for cardioversion following 3 weeks of therapeutic anticoagulation.  She has been on Eliquis in the setting of a CHA2DS2-VASc of 7.  Unfortunately, she was readmitted July 18 with palpitations and tachycardia.  She remains on IV diltiazem at 5 mg/h and is currently rate controlled in the 70s to 80s.  She is n.p.o. with plan for TEE and cardioversion this morning. After careful review of history and examination, the risks and benefits of transesophageal echocardiogram have been explained including risks of esophageal damage, perforation (1:10,000 risk), bleeding,  pharyngeal hematoma as well as other potential complications associated with conscious sedation including aspiration, arrhythmia, respiratory failure and death. Alternatives to treatment were discussed, questions were answered. Patient is willing to proceed.   2.  Nonobstructive CAD/demand ischemia: Patient with prior diagnostic catheterization in April 2016 showing nonobstructive CAD.  Upon presentation, she did not complain of chest pain but was noted to have an elevated high-sensitivity troponin of 211, which has since trended down.  Asymptomatic this morning.  Suspect demand ischemia.  Will need to consider outpatient ischemic testing given ongoing risk factors.  Continue aspirin and statin therapy for the time being.  Would likely plan to discontinue aspirin at discharge given chronic Eliquis therapy.  3.  Chronic heart failure with preserved EF: Euvolemic on examination.  Her weight has been stable at home recently.  Currently heart rate and blood pressure controlled.  She has undergone amyloid work-up as an outpatient with  PYP suggestive of amyloid. Nl SPEP/UPEP.  Continue current dose of Lasix.  4.  Essential hypertension: Currently stable.  5.  Hyperlipidemia: LDL of 49 in June.  Continue statin therapy.  6.  Stage IIIb chronic kidney disease: Creatinine stable.  7.  Normocytic anemia: Stable.  8.  Type 2 diabetes mellitus: A1c 7.6 in June.  Continue insulin therapy.  Signed, Murray Hodgkins, NP  05/04/2020, 8:04 AM    For questions or updates, please contact   Please consult [email or web address] for contact info under Cardiology/STEMI.   The patient was seen, examined and discussed with Ignacia Bayley, NP and agree as above.  The patient remains in rate controlled atrial fibrillation, there is no opening for TEE/DCCV for today, we will feed her today and plan for tomorrow. Troponini elevation most probably related to demand ischemia with a-fib with RVR, flat trend. Hb 10.0 on Eliquis 5 mg  PO BID, CHADS-VASc 7. BP now controlled.  Ena Dawley, MD 05/04/2020

## 2020-05-04 NOTE — Plan of Care (Signed)
  Problem: Education: Goal: Knowledge of disease or condition will improve Outcome: Progressing Goal: Understanding of medication regimen will improve Outcome: Progressing   Problem: Activity: Goal: Ability to tolerate increased activity will improve Outcome: Progressing   

## 2020-05-04 NOTE — Progress Notes (Signed)
Patient stated she would place herself on CPAP tonight when she is ready. Patient informed to call if she needed assistance.

## 2020-05-05 ENCOUNTER — Inpatient Hospital Stay (HOSPITAL_COMMUNITY): Payer: Medicare Other

## 2020-05-05 ENCOUNTER — Inpatient Hospital Stay (HOSPITAL_COMMUNITY): Payer: Medicare Other | Admitting: Anesthesiology

## 2020-05-05 ENCOUNTER — Ambulatory Visit: Payer: Medicare Other | Admitting: Cardiovascular Disease

## 2020-05-05 ENCOUNTER — Encounter (HOSPITAL_COMMUNITY)
Admission: EM | Disposition: A | Discharge status: Home / Self Care | Payer: Self-pay | Source: Home / Self Care | Attending: Cardiology

## 2020-05-05 DIAGNOSIS — I513 Intracardiac thrombosis, not elsewhere classified: Secondary | ICD-10-CM

## 2020-05-05 DIAGNOSIS — I10 Essential (primary) hypertension: Secondary | ICD-10-CM

## 2020-05-05 DIAGNOSIS — I4891 Unspecified atrial fibrillation: Secondary | ICD-10-CM

## 2020-05-05 HISTORY — PX: TEE WITHOUT CARDIOVERSION: SHX5443

## 2020-05-05 LAB — BASIC METABOLIC PANEL
Anion gap: 9 (ref 5–15)
BUN: 32 mg/dL — ABNORMAL HIGH (ref 8–23)
CO2: 27 mmol/L (ref 22–32)
Calcium: 9.4 mg/dL (ref 8.9–10.3)
Chloride: 102 mmol/L (ref 98–111)
Creatinine, Ser: 1.61 mg/dL — ABNORMAL HIGH (ref 0.44–1.00)
GFR calc Af Amer: 36 mL/min — ABNORMAL LOW (ref >60)
GFR calc non Af Amer: 31 mL/min — ABNORMAL LOW (ref >60)
Glucose, Bld: 134 mg/dL — ABNORMAL HIGH (ref 70–99)
Potassium: 4.1 mmol/L (ref 3.5–5.1)
Sodium: 138 mmol/L (ref 135–145)

## 2020-05-05 LAB — GLUCOSE, CAPILLARY
Glucose-Capillary: 105 mg/dL — ABNORMAL HIGH (ref 70–99)
Glucose-Capillary: 133 mg/dL — ABNORMAL HIGH (ref 70–99)
Glucose-Capillary: 220 mg/dL — ABNORMAL HIGH (ref 70–99)
Glucose-Capillary: 96 mg/dL (ref 70–99)
Glucose-Capillary: 116 mg/dL — ABNORMAL HIGH (ref 70–99)

## 2020-05-05 SURGERY — ECHOCARDIOGRAM, TRANSESOPHAGEAL
Anesthesia: Monitor Anesthesia Care

## 2020-05-05 MED ORDER — CARVEDILOL 6.25 MG PO TABS
6.2500 mg | ORAL_TABLET | Freq: Two times a day (BID) | ORAL | Status: DC
  Administered 2020-05-05 – 2020-05-06 (×3): 6.25 mg via ORAL
  Filled 2020-05-05 (×3): qty 1

## 2020-05-05 MED ORDER — METOPROLOL TARTRATE 5 MG/5ML IV SOLN
5.0000 mg | Freq: Once | INTRAVENOUS | Status: AC
  Administered 2020-05-05: 5 mg via INTRAVENOUS

## 2020-05-05 MED ORDER — METOPROLOL TARTRATE 5 MG/5ML IV SOLN
INTRAVENOUS | Status: AC
  Filled 2020-05-05: qty 5

## 2020-05-05 MED ORDER — FUROSEMIDE 40 MG PO TABS
40.0000 mg | ORAL_TABLET | Freq: Every day | ORAL | Status: DC
  Administered 2020-05-06 – 2020-05-07 (×2): 40 mg via ORAL
  Filled 2020-05-05 (×2): qty 1

## 2020-05-05 MED ORDER — LACTATED RINGERS IV SOLN
INTRAVENOUS | Status: AC | PRN
  Administered 2020-05-05: 1000 mL via INTRAVENOUS

## 2020-05-05 MED ORDER — LIDOCAINE 2% (20 MG/ML) 5 ML SYRINGE
INTRAMUSCULAR | Status: DC | PRN
Start: 2020-05-05 — End: 2020-05-05
  Administered 2020-05-05: 100 mg via INTRAVENOUS

## 2020-05-05 MED ORDER — PERFLUTREN LIPID MICROSPHERE
INTRAVENOUS | Status: AC
  Filled 2020-05-05: qty 10

## 2020-05-05 MED ORDER — PERFLUTREN LIPID MICROSPHERE
INTRAVENOUS | Status: DC | PRN
  Administered 2020-05-05: 2 mL via INTRAVENOUS

## 2020-05-05 MED ORDER — NITROGLYCERIN 0.4 MG SL SUBL
SUBLINGUAL_TABLET | SUBLINGUAL | Status: AC
  Filled 2020-05-05: qty 1

## 2020-05-05 MED ORDER — PROPOFOL 500 MG/50ML IV EMUL
INTRAVENOUS | Status: DC | PRN
Start: 2020-05-05 — End: 2020-05-05
  Administered 2020-05-05: 150 ug/kg/min via INTRAVENOUS

## 2020-05-05 MED ORDER — PROPOFOL 10 MG/ML IV BOLUS
INTRAVENOUS | Status: DC | PRN
  Administered 2020-05-05: 20 mg via INTRAVENOUS

## 2020-05-05 MED ORDER — NITROGLYCERIN 0.4 MG SL SUBL
0.4000 mg | SUBLINGUAL_TABLET | SUBLINGUAL | Status: DC | PRN
  Administered 2020-05-05 (×2): 0.4 mg via SUBLINGUAL

## 2020-05-05 NOTE — Progress Notes (Signed)
   05/05/20 1517  Clinical Encounter Type  Visited With Patient  Visit Type Follow-up;Other (Comment) (Advance Directive)  Referral From Nurse  Consult/Referral To Chaplain  This chaplain responded to consult for spiritual care.  The Pt. desired Advance Directive education for the HCPOA and Living Will.  The Pt. stated her son-Eric is currently the HCPOA. The Pt. discussed changing the HCPOA with the chaplain. The topic of voiding an existing document was not discussed with the Pt. The Pt communicated her plans to talk to her family before proceeding.  The incomplete document was left in the Pt. room.  The chaplain shared how to complete the document outside of the hospital.  F/U spiritual care is available as needed.

## 2020-05-05 NOTE — Progress Notes (Signed)
Palpitations ("heart quivering and flipping") are moe prominent than chest pressure, which is now barely noticeable. HR 110-135, AFib w RVR, normal BP. Clear lungs, breathing quietly and speaking in uninterrupted sentences lying fully supine in bed. ECG without major ischemic changes, low voltage c/w suspected amyloidosis. TEE earlier showed LA appendage thrombus, DCCV deferred. Metoprolol 5 mg IV, repeat if needed to keep HR<100 at rest. Beta blockers preferable to diltiazem with low LVEF.

## 2020-05-05 NOTE — H&P (Signed)
   INTERVAL PROCEDURE H&P  History and Physical Interval Note:  05/05/2020 7:35 AM  Regina [name] has presented today for their planned procedure. The various methods of treatment have been discussed with the patient and family. After consideration of risks, benefits and other options for treatment, the patient has consented to the procedure.  The patients' outpatient history has been reviewed, patient examined, and no change in status from most recent office note within the past 30 days. I have reviewed the patients' chart and labs and will proceed as planned. Questions were answered to the patient's satisfaction.   Regina Casino, MD, [facility], [place] Director of the Advanced Lipid Disorders &  Cardiovascular Risk Reduction Clinic Diplomate of the American Board of Clinical Lipidology Attending Cardiologist  Direct Dial: [phone]  Fax: [phone]  Website:  [name] 05/05/2020, 7:35 AM

## 2020-05-05 NOTE — Transfer of Care (Signed)
Immediate Anesthesia Transfer of Care Note  Patient: ADAYA GARMANY  Procedure(s) Performed: TRANSESOPHAGEAL ECHOCARDIOGRAM (TEE) (N/A )  Patient Location: Endoscopy Unit  Anesthesia Type:MAC  Level of Consciousness: awake, alert  and oriented  Airway & Oxygen Therapy: Patient Spontanous Breathing and Patient connected to nasal cannula oxygen  Post-op Assessment: Report given to RN, Post -op Vital signs reviewed and stable and Patient moving all extremities  Post vital signs: Reviewed and stable  Last Vitals:  Vitals Value Taken Time  BP 168/128 05/05/20 0829  Temp    Pulse 80 05/05/20 0834  Resp 22 05/05/20 0834  SpO2 97 % 05/05/20 0834  Vitals shown include unvalidated device data.  Last Pain:  Vitals:   05/05/20 0717  TempSrc: Oral  PainSc: 0-No pain      Patients Stated Pain Goal: 0 (46/27/03 5009)  Complications: No complications documented.

## 2020-05-05 NOTE — Anesthesia Preprocedure Evaluation (Addendum)
Anesthesia Evaluation  Patient identified by MRN, date of birth, ID band Patient awake    Reviewed: Allergy & Precautions, NPO status , Patient's Chart, lab work & pertinent test results  Airway Mallampati: II  TM Distance: >3 FB Neck ROM: Full    Dental   Pulmonary    breath sounds clear to auscultation       Cardiovascular hypertension,  Rhythm:Irregular Rate:Tachycardia     Neuro/Psych    GI/Hepatic   Endo/Other  diabetes  Renal/GU      Musculoskeletal   Abdominal   Peds  Hematology   Anesthesia Other Findings   Reproductive/Obstetrics                            Anesthesia Physical Anesthesia Plan  ASA: III  Anesthesia Plan: MAC   Post-op Pain Management:    Induction: Intravenous  PONV Risk Score and Plan:   Airway Management Planned: Natural Airway and Nasal Cannula  Additional Equipment:   Intra-op Plan:   Post-operative Plan:   Informed Consent: I have reviewed the patients History and Physical, chart, labs and discussed the procedure including the risks, benefits and alternatives for the proposed anesthesia with the patient or authorized representative who has indicated his/her understanding and acceptance.     Dental advisory given  Plan Discussed with: CRNA and Anesthesiologist  Anesthesia Plan Comments:        Anesthesia Quick Evaluation

## 2020-05-05 NOTE — Progress Notes (Signed)
   05/05/20 1356  Clinical Encounter Type  Visited With Patient and family together  Visit Type Initial;Spiritual support  Referral From Nurse  Consult/Referral To Chaplain  Spiritual Encounters  Spiritual Needs Prayer  Stress Factors  Patient Stress Factors Health changes  Family Stress Factors None identified  This chaplain responded to the RN page for Pt. spiritual care.  The chaplain understands the Pt. is experiencing A-fib and had a procedure today needed to provide a diagnosis.  The Pt. shared with the chaplain the night before was restless and today procedures have left her tired.  The chaplain was a witness to the Pt. smile as she talked about her family and their love for her.  The Pt. accepted the chaplain's invitation for prayer and a F/U visit.

## 2020-05-05 NOTE — Anesthesia Procedure Notes (Signed)
Procedure Name: MAC Date/Time: 05/05/2020 7:55 AM Performed by: Amadeo Garnet, CRNA Pre-anesthesia Checklist: Patient identified, Suction available, Emergency Drugs available and Patient being monitored Patient Re-evaluated:Patient Re-evaluated prior to induction Oxygen Delivery Method: Nasal cannula Preoxygenation: Pre-oxygenation with 100% oxygen Induction Type: IV induction Placement Confirmation: positive ETCO2 Dental Injury: Teeth and Oropharynx as per pre-operative assessment

## 2020-05-05 NOTE — Progress Notes (Signed)
  Echocardiogram Echocardiogram Transesophageal has been performed.  Bobbye Charleston 05/05/2020, 8:33 AM

## 2020-05-05 NOTE — Anesthesia Postprocedure Evaluation (Signed)
Anesthesia Post Note  Patient: Regina [name]  Procedure(s) Performed: TRANSESOPHAGEAL ECHOCARDIOGRAM (TEE) (N/A )     Patient location during evaluation: PACU Anesthesia Type: MAC Level of consciousness: awake and alert Pain management: pain level controlled Vital Signs Assessment: post-procedure vital signs reviewed and stable Respiratory status: spontaneous breathing, nonlabored ventilation, respiratory function stable and patient connected to nasal cannula oxygen Cardiovascular status: stable and blood pressure returned to baseline Postop Assessment: no apparent nausea or vomiting Anesthetic complications: no   No complications documented.  Last Vitals:  Vitals:   05/05/20 0944 05/05/20 1136  BP: 136/77 (!) 116/58  Pulse: (!) 105 96  Resp: 17 16  Temp:  36.8 C  SpO2: 100% 95%    Last Pain:  Vitals:   05/05/20 1136  TempSrc: Oral  PainSc:                  Rose Hegner COKER

## 2020-05-05 NOTE — CV Procedure (Signed)
TRANSESOPHAGEAL ECHOCARDIOGRAM (TEE) NOTE  INDICATIONS: atrial fibrillation  PROCEDURE:   Informed consent was obtained prior to the procedure. The risks, benefits and alternatives for the procedure were discussed and the patient comprehended these risks.  Risks include, but are not limited to, cough, sore throat, vomiting, nausea, somnolence, esophageal and stomach trauma or perforation, bleeding, low blood pressure, aspiration, pneumonia, infection, trauma to the teeth and death.    After a procedural time-out, the patient was given propofol per anesthesia for moderate sedation.  The patient's heart rate, blood pressure, and oxygen saturation are monitored continuously during the procedure.The oropharynx was anesthetized with 2 sprays of topical cetacaine.  The transesophageal probe was inserted in the esophagus and stomach without difficulty and multiple views were obtained.  The patient was kept under observation until the patient left the procedure room.  I was present face-to-face 100% of this time. The patient left the procedure room in stable condition.   Agitated microbubble saline contrast was not administered. The patient was given Definity contrast to opacify the left atrial appendage.  COMPLICATIONS:    There were no immediate complications.  Findings:  1. LEFT VENTRICLE: The left ventricular wall thickness is severely increased.  The left ventricular cavity is small in size. Wall motion is globally hypokinetic.  LVEF is 35-40%.  2. RIGHT VENTRICLE:  The right ventricle is normal in structure and function without any thrombus or masses.    3. LEFT ATRIUM:  The left atrium is mildly dilated in size without any thrombus or masses.  There is spontaneous echo contrast ("smoke") in the left atrium consistent with a low flow state.  4. LEFT ATRIAL APPENDAGE:  The left atrial appendage is large and noted to have smoke and sludge. Definity contrast was administered due to concern  for thrombus - this demonstrated very low flow and streaming concerning for sludge. I reviewed the images with Dr. Meda Coffee who was present and we agreed that the was probable sludge/soft thrombus. The appendage has a single lobe. Pulse doppler indicates low flow in the appendage.  5. ATRIAL SEPTUM:  The atrial septum appears intact and is free of thrombus and/or masses.  There is no evidence for interatrial shunting by color doppler and saline microbubble.  6. RIGHT ATRIUM:  The right atrium is normal in size and function without any thrombus or masses.  7. MITRAL VALVE:  The mitral valve is normal in structure and function with trivial regurgitation.  There were no vegetations or stenosis.  8. AORTIC VALVE:  The aortic valve is trileaflet, normal in structure and function with no regurgitation.  There were no vegetations or stenosis  9. TRICUSPID VALVE:  The tricuspid valve is normal in structure and function with trivial regurgitation.  There were no vegetations or stenosis  10.  PULMONIC VALVE:  The pulmonic valve is normal in structure and function with no regurgitation.  There were no vegetations or stenosis.   11. AORTIC ARCH, ASCENDING AND DESCENDING AORTA:  There was grade 2 Ron Parker et. Al, 1992) atherosclerosis of the ascending aorta, aortic arch, or proximal descending aorta.  12. PULMONARY VEINS: Anomalous pulmonary venous return was not noted.  13. PERICARDIUM: The pericardium appeared normal and non-thickened.  There is no pericardial effusion.  IMPRESSION:   1. Probable sludge/soft thrombus with low emptying velocities of the large left atrial appendage as visualized with Definity contrast. 2. Mild LAE with moderate smoke 3. Negative for PFO 4. Severe concentric LV wall thickening 5. LVEF 35-40%, global  hypokinesis.  RECOMMENDATIONS:    1. Recommend longer duration anticoagulation. Consider AAD therapy given difficult to rate control afib and development of cardiomyopathy.    Time Spent Directly with the Patient:  60 minutes   Pixie Casino, MD, [facility], [place] Director of the Advanced Lipid Disorders &  Cardiovascular Risk Reduction Clinic Diplomate of the American Board of Clinical Lipidology Attending Cardiologist  Direct Dial: [phone]  Fax: [phone]  Website:  [email or web address]  Nadean Corwin Hampton Cost 05/05/2020, 8:26 AM

## 2020-05-05 NOTE — Progress Notes (Addendum)
Progress Note  Patient Name: Regina [name] Date of Encounter: 05/05/2020  Primary Cardiologist: Evalina Field, MD  Subjective   Bothered by palpitations throughout the night last night. Rates intermittently into the 130's - 150's.  S/p TEE this AM w/ concern for sludge/soft thrombus, therefore, DCCV not performed.  Currently no complaints.  Inpatient Medications    Scheduled Meds: . apixaban  5 mg Oral BID  . aspirin EC  81 mg Oral Daily  . atorvastatin  20 mg Oral QHS  . furosemide  20 mg Oral Daily  . insulin aspart  0-20 Units Subcutaneous TID WC  . insulin aspart  0-5 Units Subcutaneous QHS  . insulin aspart  4 Units Subcutaneous TID WC  . insulin glargine  10 Units Subcutaneous QHS  . off the beat book   Does not apply Once   Continuous Infusions: . diltiazem (CARDIZEM) infusion 10 mg/hr (05/05/20 0749)   PRN Meds: ondansetron (ZOFRAN) IV   Vital Signs    Vitals:   05/05/20 0832 05/05/20 0850 05/05/20 0924 05/05/20 0944  BP: (!) 142/74 (!) 154/48 109/89 136/77  Pulse: 98 89  (!) 105  Resp: (!) 25 (!) 24  17  Temp: 98.5 F (36.9 C)     TempSrc: Oral     SpO2: 97% 98%  100%  Weight:        Intake/Output Summary (Last 24 hours) at 05/05/2020 1056 Last data filed at 05/05/2020 0814 Gross per 24 hour  Intake 685.71 ml  Output 500 ml  Net 185.71 ml   Filed Weights   05/04/20 0243 05/05/20 0329  Weight: 100.5 kg 99.8 kg    Physical Exam   GEN: obese, in no acute distress.  HEENT: Grossly normal.  Neck: Supple, obese, difficult to gauge JVP, no carotid bruits, or masses. Cardiac: IR, IR, no murmurs, rubs, or gallops. No clubbing, cyanosis, edema.  Radials/DP/PT 2+ and equal bilaterally.  Respiratory:  Respirations regular and unlabored, bibasilar crackles. GI: Soft, nontender, nondistended, BS + x 4. MS: no deformity or atrophy. Skin: warm and dry, no rash. Neuro:  Strength and sensation are intact. Psych: AAOx3.  Normal affect.  Labs      Chemistry Recent Labs  Lab 05/03/20 1220 05/04/20 0004 05/05/20 0456  NA 133* 136 138  K 4.0 3.6 4.1  CL 99 100 102  CO2 25 27 27   GLUCOSE 227* 174* 134*  BUN 41* 36* 32*  CREATININE 1.94* 1.85* 1.61*  CALCIUM 9.0 9.0 9.4  GFRNONAA 25* 26* 31*  GFRAA 28* 30* 36*  ANIONGAP 9 9 9      Hematology Recent Labs  Lab 05/03/20 1220  WBC 7.9  RBC 3.45*  HGB 10.0*  HCT 30.7*  MCV 89.0  MCH 29.0  MCHC 32.6  RDW 14.9  PLT 287    Cardiac Enzymes  Recent Labs  Lab 04/24/20 1857 04/24/20 2055 05/03/20 1220 05/03/20 1559  TROPONINIHS 211* 179* 78* 87*       Lipids  Lab Results  Component Value Date   CHOL 117 03/24/2020   HDL 56 03/24/2020   LDLCALC 49 03/24/2020   TRIG 54 03/24/2020   CHOLHDL 2 05/22/2019    HbA1c  Lab Results  Component Value Date   HGBA1C 7.6 (H) 03/24/2020    Radiology   ---  Telemetry    Afib - 70's- 90's throughout the day yesterday w/ spikes into the 150s overnight - Personally Reviewed  Cardiac Studies   PYP 7.1.2021  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 (Normal SPEP/UPEP 7.6.2021) _____________  2D Echocardiogram 6.18.2021  1. Left ventricular ejection fraction, by estimation, is 55 to 60%. The  left ventricle has normal function. The left ventricle has no regional  wall motion abnormalities. There is moderate concentric left ventricular  hypertrophy. Left ventricular  diastolic parameters are consistent with Grade II diastolic dysfunction  (pseudonormalization). Elevated left atrial pressure.  2. Right ventricular systolic function is normal. The right ventricular  size is normal. There is normal pulmonary artery systolic pressure.  3. Left atrial size was mildly dilated.  4. The mitral valve is normal in structure. Mild mitral valve  regurgitation. No evidence of mitral stenosis.  5. The aortic valve is normal in structure. Aortic valve regurgitation is  trivial. No  aortic stenosis is present.  6. The inferior vena cava is normal in size with greater than 50%  respiratory variability, suggesting right atrial pressure of 3 mmHg.  _____________   Cardiac Catheterization 4.4.2016  HEMODYNAMICS: Aortic pressure 152/57 mmHg; LV pressure 166/9 mmHg; LVEDP 19 mmHg  ANGIOGRAPHIC DATA: The left main coronary artery is normal.  The left anterior descending artery is large and wraps around the left ventricular apex. There is mid eccentric 50% eccentric narrowing after the origin of the first large diagonal. A significant stenosis is not felt to be present..  The left circumflex artery is large and free of any significant obstruction. 3 large obtuse marginals arise from the circumflex. Minimal luminal irregularities are noted..  The right coronary artery is dominant and contains mid vessel eccentric 50-60% narrowing.  LEFT VENTRICULOGRAM: Left ventricular angiogram was not performed. Pressures were recorded. _____________  Transesophageal Echocardiogram 7.20.2021  Findings:   1. LEFT VENTRICLE: The left ventricular wall thickness is severely increased.  The left ventricular cavity is small in size. Wall motion is globally hypokinetic.  LVEF is 35-40%.   2. RIGHT VENTRICLE:  The right ventricle is normal in structure and function without any thrombus or masses.     3. LEFT ATRIUM:  The left atrium is mildly dilated in size without any thrombus or masses.  There is spontaneous echo contrast ("smoke") in the left atrium consistent with a low flow state.   4. LEFT ATRIAL APPENDAGE:  The left atrial appendage is large and noted to have smoke and sludge. Definity contrast was administered due to concern for thrombus - this demonstrated very low flow and streaming concerning for sludge. I reviewed the images with Dr. Meda Coffee who was present and we agreed that the was probable sludge/soft thrombus. The appendage has a single lobe. Pulse doppler indicates low  flow in the appendage.   5. ATRIAL SEPTUM:  The atrial septum appears intact and is free of thrombus and/or masses.  There is no evidence for interatrial shunting by color doppler and saline microbubble.   6. RIGHT ATRIUM:  The right atrium is normal in size and function without any thrombus or masses.   7. MITRAL VALVE:  The mitral valve is normal in structure and function with trivial regurgitation.  There were no vegetations or stenosis.   8. AORTIC VALVE:  The aortic valve is trileaflet, normal in structure and function with no regurgitation.  There were no vegetations or stenosis   9. TRICUSPID VALVE:  The tricuspid valve is normal in structure and function with trivial regurgitation.  There were no vegetations or stenosis   10.  PULMONIC VALVE:  The pulmonic valve is normal in structure and function with  no regurgitation.  There were no vegetations or stenosis.   11. AORTIC ARCH, ASCENDING AND DESCENDING AORTA:  There was grade 2 Ron Parker et. Al, 1992) atherosclerosis of the ascending aorta, aortic arch, or proximal descending aorta.   12. PULMONARY VEINS: Anomalous pulmonary venous return was not noted.   13. PERICARDIUM: The pericardium appeared normal and non-thickened.  There is no pericardial effusion.   IMPRESSION:    1. Probable sludge/soft thrombus with low emptying velocities of the large left atrial appendage as visualized with Definity contrast. 2. Mild LAE with moderate smoke 3. Negative for PFO 4. Severe concentric LV wall thickening 5. LVEF 35-40%, global hypokinesis.  RECOMMENDATIONS:     1. Recommend longer duration anticoagulation. Consider AAD therapy given difficult to rate control afib and development of cardiomyopathy.  _____________    Patient Profile     79 y.o. female with a history of nonobstructive CAD, hypertension, hyperlipidemia, type 2 diabetes mellitus, stage III chronic kidney disease, HFpEF with abnormal PYP scan (normal UPEP/SPEP), and recent  diagnosis of persistent atrial fibrillation, who was admitted July 18 with ongoing symptomatic atrial fibrillation.  TEE 7/20 w/ probable sludge/soft thrombus, therefore, DCCV not performed.  Assessment & Plan    1.  Persistent Atrial fibrillation w/ RVR:  Admitted 7/9 w/ Afib RVR, which was initially rate controlled with oral diltiazem with plan for cardioversion followed 3 weeks of therapeutic anticoagulation.  She has been on Eliquis in the setting of a CHA2DS2-VASc of 7.  Unfortunate, she was readmitted July 18 with palpitations tachycardia.  Rate was easily controlled on IV diltiazem and she underwent TEE this morning which unfortunately showed probable sludge and soft thrombus with low emptying velocities of the large left atrial appendage and therefore, cardioversion was not performed.  EF 35 to 40% with global hypokinesis by TEE (55-60% by 2D echo June 18).  We will need to focus on aggressive rate control.  She did have elevations of heart rate on IV diltiazem overnight into the 150s.  We will look to transition intravenous diltiazem to oral diltiazem (peviously on diltiazem 180 mg daily).  She has a prior history of bradycardia with atenolol therapy.  It is not clear if she would tolerate other beta-blockers.  With slight reduction in EF, would prefer to avoid calcium channel blockers but may not have a choice.  Unfortunately, in the setting of stage III chronic kidney disease, she is a poor candidate for digoxin, and with potential left atrial tenderness clot, antiarrhythmic therapy is not warranted at this time.  Monitor heart rate with ambulation today consider discharge tomorrow if stable.  2.  Nonobstructive coronary artery disease/demand ischemia: Prior diagnostic catheterization April 2016 showed nonobstructive CAD.  Upon presentation, she did not complain of chest pain but was noted to have an elevated high-sensitivity troponin of 211, which has since trended down.  Though she was restless  with elevated heart rates last night, she did not have any recurrent chest pain.  We will likely plan on outpatient ischemic testing given ongoing risk factors.  Continue aspirin and statin therapy.  3.  Chronic heart failure with preserved EF: Normal EF by echo in June though TEE this morning with an EF of 35-40%.  Bibasilar crackles on examination this morning, but she is otherwise euvolemic appearing and weight is stable and she is net 683 mL for admission.  As above, focus on heart rate and blood pressure control.  Continue current dose of Lasix.  Previous intolerance to beta-blockers in  the setting of bradycardia.  No ACE/ARB/ARNI/MRA in the setting of stage IIIb kidney disease.  4.  Essential hypertension: Stable this morning.  5.  Hyperlipidemia: LDL of 49 in June.  Continue statin therapy.  6.  Stage IIIb chronic kidney disease: Stable.  7.  Normocytic anemia: H&H stable earlier this admission.    8.  Type 2 diabetes mellitus: A1c 7.6 in June.  Continue insulin therapy.  Signed, Murray Hodgkins, NP  05/05/2020, 10:56 AM    For questions or updates, please contact   Please consult [email or web address] for contact info under Cardiology/STEMI.   The patient was seen, examined and discussed with Ignacia Bayley, NP and agree as above.  The patient had episode of A. fib with RVR through Sunday night, she underwent TEE today with finding of decreased emptying and filling velocities in left atrial appendage and heavy sludge consistent with prethrombotic state, cardioversion was not performed. At this point patient has been on anticoagulation only for 12 days, we will need to continue for minimum of 4 weeks before we attempt another cardioversion. We need to focus on rate control. Patient is currently on Cardizem drip, her LVEF is lower than previously stated this is most probably secondary to A. fib with RVR, however long-term it would be preferred if she was on on Cardizem. I will start her on  carvedilol 6.25 mg p.o. twice daily in order to try to wean off the Cardizem drip. We will continue Eliquis. I will increase Lasix to 40 mg daily.  Ena Dawley, MD 05/05/2020

## 2020-05-05 NOTE — Progress Notes (Signed)
Per nurse, cardizem drip now weaned off, patient's HR noted to fluctuate to 120s-140s at times. Telemetry reviewed and she is also frequently dipping into the 80s-90s as well. Started on carvedilol today. Just received 2nd dose of carvedilol within the hour. Per nurse patient feels OK. BP appears stable this admission. Chart outlines a history of prior bradycardia with beta blockers therefore would prefer to monitor since she just got dose a short while ago. If HR continues to sustain >120, may need to resume diltiazem although less ideal with LVF. Not a good candidate for digoxin given renal insufficiency and need to avoid antiarrhythmic agents given potential left atrial clot. Kaled Allende PA-C

## 2020-05-05 NOTE — Progress Notes (Signed)
Patient c/o 4/10 Chest pain, HR 115 BP 149/92. PA Dunn notified. EKG obtained. 4L O2 applied, 1 nitrostat given BP after nirto- 118/82, chest pain 4/10 2nd nitro given HR up to 130s, Dr. Sallyanne Kuster at bedside Orders for 5mg  lopressor

## 2020-05-06 ENCOUNTER — Ambulatory Visit (INDEPENDENT_AMBULATORY_CARE_PROVIDER_SITE_OTHER): Payer: Medicare Other | Admitting: Adult Health

## 2020-05-06 ENCOUNTER — Encounter (HOSPITAL_COMMUNITY): Payer: Self-pay | Admitting: Cardiology

## 2020-05-06 DIAGNOSIS — I5043 Acute on chronic combined systolic (congestive) and diastolic (congestive) heart failure: Secondary | ICD-10-CM

## 2020-05-06 LAB — GLUCOSE, CAPILLARY
Glucose-Capillary: 101 mg/dL — ABNORMAL HIGH (ref 70–99)
Glucose-Capillary: 220 mg/dL — ABNORMAL HIGH (ref 70–99)
Glucose-Capillary: 213 mg/dL — ABNORMAL HIGH (ref 70–99)
Glucose-Capillary: 64 mg/dL — ABNORMAL LOW (ref 70–99)
Glucose-Capillary: 83 mg/dL (ref 70–99)
Glucose-Capillary: 170 mg/dL — ABNORMAL HIGH (ref 70–99)

## 2020-05-06 MED ORDER — CARVEDILOL 6.25 MG PO TABS
6.2500 mg | ORAL_TABLET | Freq: Once | ORAL | Status: AC
  Administered 2020-05-06: 6.25 mg via ORAL
  Filled 2020-05-06: qty 1

## 2020-05-06 MED ORDER — CARVEDILOL 12.5 MG PO TABS
12.5000 mg | ORAL_TABLET | Freq: Two times a day (BID) | ORAL | Status: DC
  Administered 2020-05-06: 12.5 mg via ORAL
  Filled 2020-05-06: qty 1

## 2020-05-06 NOTE — Progress Notes (Addendum)
Progress Note  Patient Name: Regina [name] Date of Encounter: 05/06/2020  Primary Cardiologist: Evalina Field, MD  Subjective   Recurrent tachycardia off of IV diltiazem yesterday afternoon and evening despite initiation of oral beta-blocker therapy.  Heart rate is relatively stable during periods of sleep at about 100 however, this morning, she is back up to 130s to 150s.  She denies chest pain or dyspnea at this time.  Inpatient Medications    Scheduled Meds: . apixaban  5 mg Oral BID  . aspirin EC  81 mg Oral Daily  . atorvastatin  20 mg Oral QHS  . carvedilol  12.5 mg Oral BID WC  . furosemide  40 mg Oral Daily  . insulin aspart  0-20 Units Subcutaneous TID WC  . insulin aspart  0-5 Units Subcutaneous QHS  . insulin aspart  4 Units Subcutaneous TID WC  . insulin glargine  10 Units Subcutaneous QHS  . off the beat book   Does not apply Once   Continuous Infusions: . diltiazem (CARDIZEM) infusion Stopped (05/05/20 1353)   PRN Meds: nitroGLYCERIN, ondansetron (ZOFRAN) IV   Vital Signs    Vitals:   05/06/20 0425 05/06/20 0746 05/06/20 0800 05/06/20 1033  BP: 121/84 (!) 132/115 109/73 104/87  Pulse: 94 95 (!) 125 (!) 111  Resp: 18 18  18   Temp: 98.8 F (37.1 C) 98.3 F (36.8 C)  98.5 F (36.9 C)  TempSrc: Oral Oral  Oral  SpO2: 100% 100%    Weight: 101 kg       Intake/Output Summary (Last 24 hours) at 05/06/2020 1101 Last data filed at 05/05/2020 2000 Gross per 24 hour  Intake 435 ml  Output --  Net 435 ml   Filed Weights   05/04/20 0243 05/05/20 0329 05/06/20 0425  Weight: 100.5 kg 99.8 kg 101 kg    Physical Exam   GEN: Obese, well developed, in no acute distress.  HEENT: Grossly normal.  Neck: Supple, obese, difficult to gauge JVP.  No dysuria carotid bruits, or masses. Cardiac: Irregularly, irregular, tachycardic, no murmurs, rubs, or gallops. No clubbing, cyanosis, edema.  Radials/DP/PT 2+ and equal bilaterally.  Respiratory:  Respirations  regular and unlabored, clear to auscultation bilaterally. GI: Soft, nontender, nondistended, BS + x 4. MS: no deformity or atrophy. Skin: warm and dry, no rash. Neuro:  Strength and sensation are intact. Psych: AAOx3.  Normal affect.  Labs    Chemistry Recent Labs  Lab 05/03/20 1220 05/04/20 0004 05/05/20 0456  NA 133* 136 138  K 4.0 3.6 4.1  CL 99 100 102  CO2 25 27 27   GLUCOSE 227* 174* 134*  BUN 41* 36* 32*  CREATININE 1.94* 1.85* 1.61*  CALCIUM 9.0 9.0 9.4  GFRNONAA 25* 26* 31*  GFRAA 28* 30* 36*  ANIONGAP 9 9 9      Hematology Recent Labs  Lab 05/03/20 1220  WBC 7.9  RBC 3.45*  HGB 10.0*  HCT 30.7*  MCV 89.0  MCH 29.0  MCHC 32.6  RDW 14.9  PLT 287    Cardiac Enzymes  Recent Labs  Lab 04/24/20 1857 04/24/20 2055 05/03/20 1220 05/03/20 1559  TROPONINIHS 211* 179* 78* 87*      Lipids  Lab Results  Component Value Date   CHOL 117 03/24/2020   HDL 56 03/24/2020   LDLCALC 49 03/24/2020   TRIG 54 03/24/2020   CHOLHDL 2 05/22/2019    HbA1c  Lab Results  Component Value Date   HGBA1C 7.6 (H)  03/24/2020    Radiology    ---  Telemetry    Persistent atrial fibrillation trending in the low 100s overnight but jumping to the 140s to 150s while awake.  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 (Normal SPEP/UPEP 7.6.2021) _____________  2D Echocardiogram6.18.2021  1. Left ventricular ejection fraction, by estimation, is 55 to 60%. The  left ventricle has normal function. The left ventricle has no regional  wall motion abnormalities. There is moderate concentric left ventricular  hypertrophy. Left ventricular  diastolic parameters are consistent with Grade II diastolic dysfunction  (pseudonormalization). Elevated left atrial  pressure.  2. Right ventricular systolic function is normal. The right ventricular  size is normal. There is normal pulmonary artery systolic pressure.  3. Left atrial size was mildly dilated.  4. The mitral valve is normal in structure. Mild mitral valve  regurgitation. No evidence of mitral stenosis.  5. The aortic valve is normal in structure. Aortic valve regurgitation is  trivial. No aortic stenosis is present.  6. The inferior vena cava is normal in size with greater than 50%  respiratory variability, suggesting right atrial pressure of 3 mmHg.  _____________  Cardiac Catheterization4.4.2016  HEMODYNAMICS:Aortic pressure 152/57 mmHg; LV pressure 166/9 mmHg; LVEDP 19 mmHg  ANGIOGRAPHIC DATA:The left main coronary artery is normal.  The left anterior descending artery is large and wraps around the left ventricular apex. There is mid eccentric 50% eccentric narrowing after the origin of the first large diagonal. A significant stenosis is not felt to be present..  The left circumflex artery is large and free of any significant obstruction. 3 large obtuse marginals arise from the circumflex. Minimal luminal irregularities are noted..  The right coronary artery is dominant and contains mid vessel eccentric 50-60% narrowing.  LEFT VENTRICULOGRAM:Left ventricular angiogram was not performed. Pressures were recorded. _____________  Transesophageal Echocardiogram 7.20.2021  Findings:  1. LEFT VENTRICLE: The left ventricular wall thickness isseverely increased. The left ventricular cavity is smallin size. Wall motion is globally hypokinetic. LVEF is 35-40%.  2. RIGHT VENTRICLE: The right ventricle is normal in structure and function without any thrombus or masses.   3. LEFT ATRIUM: The left atrium is mildly dilatedin size without any thrombus or masses. There isspontaneous echo contrast ("smoke") in the left atrium consistent with a low flow  state.  4. LEFT ATRIAL APPENDAGE: The left atrial appendage is large and noted to have smoke and sludge.Definity contrast was administered due to concern for thrombus - this demonstrated very low flow and streaming concerning for sludge. I reviewed the images with Dr. Meda Coffee who was present and we agreed that the was probable sludge/soft thrombus.The appendage hasasinglelobe. Pulse doppler indicates lowflow in the appendage.  5. ATRIAL SEPTUM: The atrial septum appears intact and is free of thrombus and/or masses. There is no evidence for interatrial shunting by color doppler and saline microbubble.  6. RIGHT ATRIUM: The right atrium is normalin size and function without any thrombus or masses.  7. MITRAL VALVE: The mitral valve is normal in structure and function with trivialregurgitation. There were no vegetations or stenosis.  8. AORTIC VALVE: The aortic valve is trileaflet, normal in structure and function with noregurgitation. There were no vegetations or stenosis  9. TRICUSPID VALVE: The tricuspid valve is normal in structure and function with trivialregurgitation. There were no vegetations or stenosis  10.  PULMONIC VALVE: The pulmonic valve is normal in structure and function with noregurgitation. There were no vegetations or stenosis.  11. AORTIC ARCH, ASCENDING AND DESCENDING AORTA: There was grade 2(Katz et. Al, 1992) atherosclerosis of the ascending aorta, aortic arch, or proximal descending aorta.  12. PULMONARY VEINS: Anomalous pulmonary venous returnwas notnoted.  13. PERICARDIUM: The pericardium appeared normal and non-thickened. There is nopericardial effusion.  IMPRESSION:  1. Probable sludge/soft thrombus with low emptying velocities of the large left atrial appendage as visualized with Definity contrast. 2. Mild LAE with moderate smoke 3. Negative for PFO 4. Severe concentric LV wall thickening 5. LVEF35-40%, global  hypokinesis. RECOMMENDATIONS:  1. Recommend longer duration anticoagulation. Consider AAD therapy given difficult to rate control afib and development of cardiomyopathy. _____________   Patient Profile     79 y.o.femalewith a history of nonobstructive CAD, hypertension, hyperlipidemia, type 2 diabetes mellitus, stage III chronic kidney disease, HFpEF with abnormal PYP scan (normal UPEP/SPEP), and recent diagnosis of persistent atrial fibrillation, who was admitted July 18 with ongoing symptomatic atrial fibrillation.  TEE 7/20 w/ probable sludge/soft thrombus, therefore, DCCV not performed.  Assessment & Plan    1.  Persistent atrial fibrillation with right ventricular spots: Admitted July 9 with A. fib RVR, which was initially rate controlled with oral diltiazem with plan for cardioversion following 3 weeks of therapeutic anticoagulation.  She has been on Eliquis in the setting of a CHA2DS2-VASc of 7.  Unfortunately, she was readmitted July 18 with palpitations and tachycardia.  Rate was usually controlled with IV diltiazem and she underwent TEE on the morning of July 20 which unfortunately showed probable sludge and soft thrombus with low emptying velocities of the large left atrial appendage and therefore, cardioversion was not performed.  EF also reduced at 35-40% global hypokinesis (previously 55-60% by 2D echocardiogram in June 18).  She was transitioned off of IV diltiazem yesterday in the setting of LV dysfunction and placed on carvedilol 3.125 mg twice daily.  This is subsequently been titrated and in the setting of ongoing tachycardia, we have increased to 12.5 mg twice daily.  She does have a prior history of bradycardia on atenolol therapy, which she said occurred about 7 to 10 years ago while she was a patient in [place] for hypertension.  Heart rate got down to 45 on atenolol.  It is likely that we will need to back off beta-blocker therapy once she is in sinus rhythm.  If blood  pressure limits further titration of carvedilol, we may need to switch to metoprolol therapy.  She is not a good candidate for initiation of digoxin therapy in the setting of chronic kidney disease and given findings of TEE, she is not a candidate for antiarrhythmic therapy at this time.  Plan to focus on rate control for the time being with TEE and cardioversion in 3 to 4 weeks.  2.  Nonobstructive CAD/demand ischemia: Prior diagnostic catheterization April 2016 showed nonobstructive CAD.  Upon presentation, she did not complain of chest pain but was noted to have an elevated heart sensitive troponin of 211, which has since trended down.  We will plan to pursue outpatient ischemic testing following restoration of sinus rhythm.  Continue aspirin and statin therapy.  3.  Chronic heart failure with reduced EF: Normal EF by echo in June though TEE on the morning of July 20 notable for LV dysfunction with an EF of 35-40%.  Euvolemic on examination this morning.  Continue current dose of Lasix.  Carvedilol added  as above.  No ACE/ARB/ARNI/MRA in the setting of stage IIIb chronic kidney disease.  4.  Essential hypertension: Stable.  5.  Hyperlipidemia: LDL of 49 in June.  Continue statin therapy.  6.  Stage IIIb chronic kidney disease:  Stable this admission.  F/u in AM.  7.  Normocytic Anemia: Stable earlier this admission.  8.  Type 2 diabetes mellitus: A1c 7.6 in June.  Gluc 134 this AM.  Cont insulin.  Signed, Murray Hodgkins, NP  05/06/2020, 11:01 AM    For questions or updates, please contact   Please consult [email or web address] for contact info under Cardiology/STEMI.   The patient was seen, examined and discussed with Ignacia Bayley, NP and agree as above.  The patient had episode of A. fib with RVR through Sunday night, she underwent TEE yesterday with finding of decreased emptying and filling velocities in left atrial appendage and heavy sludge consistent with prethrombotic state, cardioversion  was not performed. At this point patient has been on anticoagulation only for 12 days, we will need to continue for minimum of 4 weeks before we attempt another cardioversion. We need to focus on rate control. She was started on carvedilol 6.25 mg p.o. twice daily with persistent tachycardia overnight, I have increased to 12.5 mg p.o. twice daily this morning, her blood pressure is soft we might potentially switch to metoprolol tomorrow.  Her LVEF is lower than previously stated this is most probably secondary to A. fib with RVR, however long-term it would be preferred if she was on on Cardizem. I will start her on carvedilol 6.25 mg p.o. twice daily in order to try to wean off the Cardizem drip. We will continue Eliquis. I will increase Lasix to 40 mg daily.  Ena Dawley, MD 05/06/2020

## 2020-05-06 NOTE — Progress Notes (Signed)
Chaplain responded to referral from Palliative Care Chaplain to follow up on AD with more instructions if needed, or perhaps to notarize if the patient had completed the form.  Chaplain initiated relationship of care and concern with patient first.  Learned of how blessed Regina [name] is to have a close-knit family of six children; five of whom are all involved in some sort of [employer].  Regina [name] who is deceased was a Theme park manager as well so most everyone has followed in his footsteps which Regina [name] said is the source of all her feelings of being blessed.  As to the AD, her daughter has it and will bring it back later for notary.  Chaplain available for follow up as needed.  De Burrs Chaplain Resident

## 2020-05-07 LAB — GLUCOSE, CAPILLARY
Glucose-Capillary: 144 mg/dL — ABNORMAL HIGH (ref 70–99)
Glucose-Capillary: 260 mg/dL — ABNORMAL HIGH (ref 70–99)
Glucose-Capillary: 94 mg/dL (ref 70–99)

## 2020-05-07 LAB — BASIC METABOLIC PANEL
Anion gap: 10 (ref 5–15)
BUN: 36 mg/dL — ABNORMAL HIGH (ref 8–23)
CO2: 26 mmol/L (ref 22–32)
Calcium: 9 mg/dL (ref 8.9–10.3)
Chloride: 99 mmol/L (ref 98–111)
Creatinine, Ser: 1.87 mg/dL — ABNORMAL HIGH (ref 0.44–1.00)
GFR calc Af Amer: 30 mL/min — ABNORMAL LOW (ref >60)
GFR calc non Af Amer: 26 mL/min — ABNORMAL LOW (ref >60)
Glucose, Bld: 131 mg/dL — ABNORMAL HIGH (ref 70–99)
Potassium: 4.2 mmol/L (ref 3.5–5.1)
Sodium: 135 mmol/L (ref 135–145)

## 2020-05-07 MED ORDER — FUROSEMIDE 40 MG PO TABS: 40.0000 mg | ORAL_TABLET | Freq: Every day | ORAL | 3 refills | Status: DC

## 2020-05-07 MED ORDER — METOPROLOL TARTRATE 100 MG PO TABS: 100.0000 mg | ORAL_TABLET | Freq: Two times a day (BID) | ORAL | 3 refills | Status: DC

## 2020-05-07 MED ORDER — POLYETHYLENE GLYCOL 3350 17 G PO PACK: 17.0000 g | PACK | Freq: Every day | ORAL | 0 refills | Status: DC | PRN

## 2020-05-07 MED ORDER — POLYETHYLENE GLYCOL 3350 17 G PO PACK
17.0000 g | PACK | Freq: Every day | ORAL | Status: DC | PRN
  Administered 2020-05-07: 17 g via ORAL
  Filled 2020-05-07: qty 1

## 2020-05-07 MED ORDER — METOPROLOL TARTRATE 100 MG PO TABS
100.0000 mg | ORAL_TABLET | Freq: Two times a day (BID) | ORAL | Status: DC
  Administered 2020-05-07: 100 mg via ORAL
  Filled 2020-05-07: qty 1

## 2020-05-07 MED ORDER — NITROGLYCERIN 0.4 MG SL SUBL: 0.4000 mg | SUBLINGUAL_TABLET | SUBLINGUAL | 2 refills | Status: DC | PRN

## 2020-05-07 MED ORDER — TOUJEO SOLOSTAR 300 UNIT/ML ~~LOC~~ SOPN: 25.0000 [IU] | PEN_INJECTOR | Freq: Every day | SUBCUTANEOUS | 0 refills | Status: DC

## 2020-05-07 NOTE — Discharge Instructions (Signed)
 Atrial Fibrillation  Atrial fibrillation is a type of irregular or rapid heartbeat (arrhythmia). In atrial fibrillation, the top part of the heart (atria) beats in an irregular pattern. This makes the heart unable to pump blood normally and effectively. The goal of treatment is to prevent blood clots from forming, control your heart rate, or restore your heartbeat to a normal rhythm. If this condition is not treated, it can cause serious problems, such as a weakened heart muscle (cardiomyopathy) or a stroke. What are the causes? This condition is often caused by medical conditions that damage the heart's electrical system. These include:  High blood pressure (hypertension). This is the most common cause.  Certain heart problems or conditions, such as heart failure, coronary artery disease, heart valve problems, or heart surgery.  Diabetes.  Overactive thyroid (hyperthyroidism).  Obesity.  Chronic kidney disease. In some cases, the cause of this condition is not known. What increases the risk? This condition is more likely to develop in:  Older people.  People who smoke.  Athletes who do endurance exercise.  People who have a family history of atrial fibrillation.  Men.  People who use drugs.  People who drink a lot of alcohol.  People who have lung conditions, such as emphysema, pneumonia, or COPD.  People who have obstructive sleep apnea. What are the signs or symptoms? Symptoms of this condition include:  A feeling that your heart is racing or beating irregularly.  Discomfort or pain in your chest.  Shortness of breath.  Sudden light-headedness or weakness.  Tiring easily during exercise or activity.  Fatigue.  Syncope (fainting).  Sweating. In some cases, there are no symptoms. How is this diagnosed? Your health care provider may detect atrial fibrillation when taking your pulse. If detected, this condition may be diagnosed with:  An  electrocardiogram (ECG) to check electrical signals of the heart.  An ambulatory cardiac monitor to record your heart's activity for a few days.  A transthoracic echocardiogram (TTE) to create pictures of your heart.  A transesophageal echocardiogram (TEE) to create even closer pictures of your heart.  A stress test to check your blood supply while you exercise.  Imaging tests, such as a CT scan or chest X-ray.  Blood tests. How is this treated? Treatment depends on underlying conditions and how you feel when you experience atrial fibrillation. This condition may be treated with:  Medicines to prevent blood clots or to treat heart rate or heart rhythm problems.  Electrical cardioversion to reset the heart's rhythm.  A pacemaker to correct abnormal heart rhythm.  Ablation to remove the heart tissue that sends abnormal signals.  Left atrial appendage closure to seal the area where blood clots can form. In some cases, underlying conditions will be treated. Follow these instructions at home: Medicines  Take over-the counter and prescription medicines only as told by your health care provider.  Do not take any new medicines without talking to your health care provider.  If you are taking blood thinners: ? Talk with your health care provider before you take any medicines that contain aspirin or NSAIDs, such as ibuprofen. These medicines increase your risk for dangerous bleeding. ? Take your medicine exactly as told, at the same time every day. ? Avoid activities that could cause injury or bruising, and follow instructions about how to prevent falls. ? Wear a medical alert bracelet or carry a card that lists what medicines you take. Lifestyle      Do not use any   products that contain nicotine or tobacco, such as cigarettes, e-cigarettes, and chewing tobacco. If you need help quitting, ask your health care provider.  Eat heart-healthy foods. Talk with a dietitian to make an  eating plan that is right for you.  Exercise regularly as told by your health care provider.  Do not drink alcohol.  Lose weight if you are overweight.  Do not use drugs, including cannabis. General instructions  If you have obstructive sleep apnea, manage your condition as told by your health care provider.  Do not use diet pills unless your health care provider approves. Diet pills can make heart problems worse.  Keep all follow-up visits as told by your health care provider. This is important. Contact a health care provider if you:  Notice a change in the rate, rhythm, or strength of your heartbeat.  Are taking a blood thinner and you notice more bruising.  Tire more easily when you exercise or do heavy work.  Have a sudden change in weight. Get help right away if you have:   Chest pain, abdominal pain, sweating, or weakness.  Trouble breathing.  Side effects of blood thinners, such as blood in your vomit, stool, or urine, or bleeding that cannot stop.  Any symptoms of a stroke. "BE FAST" is an easy way to remember the main warning signs of a stroke: ? B - Balance. Signs are dizziness, sudden trouble walking, or loss of balance. ? E - Eyes. Signs are trouble seeing or a sudden change in vision. ? F - Face. Signs are sudden weakness or numbness of the face, or the face or eyelid drooping on one side. ? A - Arms. Signs are weakness or numbness in an arm. This happens suddenly and usually on one side of the body. ? S - Speech. Signs are sudden trouble speaking, slurred speech, or trouble understanding what people say. ? T - Time. Time to call emergency services. Write down what time symptoms started.  Other signs of a stroke, such as: ? A sudden, severe headache with no known cause. ? Nausea or vomiting. ? Seizure. These symptoms may represent a serious problem that is an emergency. Do not wait to see if the symptoms will go away. Get medical help right away. Call your  local emergency services (911 in the [place] Do not drive yourself to the hospital. Summary  Atrial fibrillation is a type of irregular or rapid heartbeat (arrhythmia).  Symptoms include a feeling that your heart is beating fast or irregularly.  You may be given medicines to prevent blood clots or to treat heart rate or heart rhythm problems.  Get help right away if you have signs or symptoms of a stroke.  Get help right away if you cannot catch your breath or have chest pain or pressure. This information is not intended to replace advice given to you by your health care provider. Make sure you discuss any questions you have with your health care provider. Document Revised: 03/27/2019 Document Reviewed: 03/27/2019 Elsevier Patient Education  [place], every am, wearing the same amount of clothing Record weights, contact Evalina Field, MD for weight gain of 3 lbs in a day or 5 lbs in a week Limit sodium to 500 mg per meal, total 2000 mg per day Limit all liquids to 1.5-2 liters/quarts per day  Follow blood sugars closely.  Make an appointment with your PCP, your insulin dose was changed here. You need to be on a daily  insulin dose, not just as needed.  Follow your blood pressure and heart rate daily.  If your heart rate drops below 55, call us.

## 2020-05-07 NOTE — Discharge Summary (Signed)
Discharge Summary    Patient ID: Regina [name] MRN: [id number]; DOB: September 24, 1941  Admit date: 05/03/2020 Discharge date: 05/07/2020  Primary Care Provider: [name], [name], MD  Primary Cardiologist: Evalina Field, MD  Primary Electrophysiologist:  None   Discharge Diagnoses    Principal Problem:   Atrial fibrillation with RVR [facility]) Active Problems:   Essential hypertension   Mixed hyperlipidemia   Chronic diastolic heart failure ([place])   Demand ischemia ([place])   Allergies Allergies  Allergen Reactions  . Ace Inhibitors Other (See Comments)    unknown  . Amlodipine Other (See Comments)    unknown  . Atenolol Other (See Comments)    bradycardia  . Avandia [Rosiglitazone] Other (See Comments)    unknown  . Darvon [Propoxyphene] Other (See Comments)    unknown  . Erythromycin Itching  . Hydralazine Other (See Comments)    Burning in throat and chest  . Hydrocodone Other (See Comments)    No reaction per patient, but ineffective  . Levofloxacin Itching  . Morphine And Related Other (See Comments)    Dizzy and hallucianation, vomiting; Willing to try low dose  . Percocet [Oxycodone-Acetaminophen] Other (See Comments)    hallucination  . Spironolactone Other (See Comments)    unknown  . Tramadol Other (See Comments)    Unknown/does not recall reaction but does not want to take again    Diagnostic Studies/Procedures    TEE: 05/05/2020 Findings:  1. LEFT VENTRICLE: The left ventricular wall thickness isseverely increased. The left ventricular cavity is smallin size. Wall motion is globally hypokinetic. LVEF is 35-40%.  2. RIGHT VENTRICLE: The right ventricle is normal in structure and function without any thrombus or masses.   3. LEFT ATRIUM: The left atrium is mildly dilatedin size without any thrombus or masses. There isspontaneous echo contrast ("smoke") in the left atrium consistent with a low flow state.  4. LEFT ATRIAL APPENDAGE: The left  atrial appendage is large and noted to have smoke and sludge.Definity contrast was administered due to concern for thrombus - this demonstrated very low flow and streaming concerning for sludge. I reviewed the images with Dr. Meda Coffee who was present and we agreed that the was probable sludge/soft thrombus.The appendage hasasinglelobe. Pulse doppler indicates lowflow in the appendage.  5. ATRIAL SEPTUM: The atrial septum appears intact and is free of thrombus and/or masses. There is no evidence for interatrial shunting by color doppler and saline microbubble.  6. RIGHT ATRIUM: The right atrium is normalin size and function without any thrombus or masses.  7. MITRAL VALVE: The mitral valve is normal in structure and function with trivialregurgitation. There were no vegetations or stenosis.  8. AORTIC VALVE: The aortic valve is trileaflet, normal in structure and function with noregurgitation. There were no vegetations or stenosis  9. TRICUSPID VALVE: The tricuspid valve is normal in structure and function with trivialregurgitation. There were no vegetations or stenosis  10. PULMONIC VALVE: The pulmonic valve is normal in structure and function with noregurgitation. There were no vegetations or stenosis.  11. AORTIC ARCH, ASCENDING AND DESCENDING AORTA: There was grade 2(Katz et. Al, 1992) atherosclerosis of the ascending aorta, aortic arch, or proximal descending aorta.  12. PULMONARY VEINS: Anomalous pulmonary venous returnwas notnoted.  13. PERICARDIUM: The pericardium appeared normal and non-thickened. There is nopericardial effusion.  IMPRESSION:  1. Probable sludge/soft thrombus with low emptying velocities of the large left atrial appendage as visualized with Definity contrast. 2. Mild LAE with moderate smoke 3. Negative for  PFO 4. Severe concentric LV wall thickening 5. LVEF35-40%, global hypokinesis. RECOMMENDATIONS:  1. Recommend longer  duration anticoagulation. Consider AAD therapy given difficult to rate control afib and development of cardiomyopathy. _____________   History of Present Illness     Regina [name] is a 79 y.o. female with a history of nonobstructive CAD, hypertension, hyperlipidemia, type 2 diabetes mellitus, stage III chronic kidney disease, HFpEF with abnormal PYP scan (normal UPEP/SPEP), and recent diagnosis of persistent atrial fibrillation, who was admitted July 18 with ongoing symptomatic atrial fibrillation.  Hospital Course     Consultants: None  She was initially admitted with palpitations and tachycardia.  Her heart rate improved with IV diltiazem.  She had a TEE on 7/20 but it showed a probable sludge and soft thrombus so cardioversion was deferred.  Her EF was 30-40%, global hypokinesis.  The diltiazem was titrated off because of her left ventricular dysfunction.  She was placed on carvedilol which was uptitrated to 12.5 mg twice daily.  However, her heart rate was still not well controlled.  Therefore, the carvedilol was changed to metoprolol 100 mg twice daily.  She will have to be watched carefully for bradycardia as she has a history of bradycardia with beta-blockers.  CHA2DS2-VASc is 7, continue Eliquis 5 mg twice daily.  She had a mild troponin elevation, with a peak troponin of 211.  This was felt secondary to demand ischemia as her troponin was highest when her heart rate was the highest as well.  Ischemic evaluation is indicated but with her left atrial appendage thrombus, this is deferred until sinus rhythm is restored.  She is on baby aspirin, beta-blocker and statin.  No ischemic symptoms.  Her EF was previously normal by echo 03/2020, but was 35-40% by TEE 05/05/2020.  She was euvolemic at discharge with a weight of 100 kg.  She is to continue beta-blocker and Lasix 40 mg daily.  Her creatinine was followed during her hospital stay and remained close to her baseline of 1.87.  Her  hemoglobin was stable at approximately 10 with no signs of acute bleeding.    On 7/22, she was seen by Dr. Meda Coffee and all data were reviewed.  Her volume status was stable.  It once the carvedilol was changed to metoprolol, her heart rate improved.  No further inpatient work-up is indicated and she is considered stable for discharge, to follow-up as an outpatient.   Did the patient have an acute coronary syndrome (MI, NSTEMI, STEMI, etc) this admission?:  No.   The elevated Troponin was due to the acute medical illness (demand ischemia).  _____________  Discharge Vitals Blood pressure 119/69, pulse 85, temperature 97.7 F (36.5 C), temperature source Oral, resp. rate 18, height 5' 2"  (1.575 m), weight 100.9 kg, SpO2 98 %.  Filed Weights   05/05/20 0329 05/06/20 0425 05/07/20 0532  Weight: 99.8 kg 101 kg 100.9 kg    Labs & Radiologic Studies    CBC Lab Results  Component Value Date   WBC 7.9 05/03/2020   HGB 10.0 (L) 05/03/2020   HCT 30.7 (L) 05/03/2020   MCV 89.0 05/03/2020   PLT 287 90/38/3338    Basic Metabolic Panel Recent Labs    05/05/20 0456 05/07/20 0428  NA 138 135  K 4.1 4.2  CL 102 99  CO2 27 26  GLUCOSE 134* 131*  BUN 32* 36*  CREATININE 1.61* 1.87*  CALCIUM 9.4 9.0   Liver Function Tests Lab Results  Component Value Date  ALT 18 04/25/2020   AST 24 04/25/2020   ALKPHOS 51 04/25/2020   BILITOT 0.7 04/25/2020   High Sensitivity Troponin:   Recent Labs  Lab 04/24/20 1857 04/24/20 2055 05/03/20 1220 05/03/20 1559  TROPONINIHS 211* 179* 78* 87*    BNP BNP    Component Value Date/Time   BNP 374.9 (H) 04/24/2020 1858   Hemoglobin A1C Lab Results  Component Value Date   HGBA1C 7.6 (H) 03/24/2020    Fasting Lipid Panel Lab Results  Component Value Date   CHOL 117 03/24/2020   HDL 56 03/24/2020   LDLCALC 49 03/24/2020   TRIG 54 03/24/2020   CHOLHDL 2 05/22/2019    Thyroid Function Tests Lab Results  Component Value Date   TSH  5.783 (H) 04/25/2020    _____________  DG Chest Port 1 View  Result Date: 04/24/2020 CLINICAL DATA:  Shortness of breath, weakness and mid chest pressure x1 week. EXAM: PORTABLE CHEST 1 VIEW COMPARISON:  January 16, 2015 FINDINGS: Mild linear atelectasis is seen within the lateral aspect of the mid left lung. There is no evidence of acute infiltrate, pleural effusion or pneumothorax. The cardiac silhouette is mildly enlarged. A right shoulder replacement is seen. This represents a new finding when compared to the prior exam. IMPRESSION: 1. Mild linear atelectasis within the mid left lung. 2. New right shoulder replacement. Electronically Signed   By: Virgina Norfolk M.D.   On: 04/24/2020 19:23   ECHO TEE  Result Date: 05/05/2020    TRANSESOPHOGEAL ECHO REPORT   Patient Name:   BLAIR MESINA Date of Exam: 05/05/2020 Medical Rec #:  [id number]         Height:       62.0 in Accession #:    [id number]        Weight:       220.1 lb Date of Birth:  August 16, 1941        BSA:          1.991 m Patient Age:    56 years          BP:           147/81 mmHg Patient Gender: F                 HR:           95 bpm. Exam Location:  Inpatient Procedure: Transesophageal Echo, 3D Echo, Cardiac Doppler, Color Doppler and            Intracardiac Opacification Agent Indications:     I48.91* Unspeicified atrial fibrillation  History:         Patient has prior history of Echocardiogram examinations, most                  recent 04/03/2020. CHF, CAD, TIA, Arrythmias:Atrial                  Fibrillation; Risk Factors:Hypertension, Dyslipidemia, Diabetes                  and Sleep Apnea. Demand ischemia.  Sonographer:     Roseanna Rainbow RDCS Referring Phys:  [id number] HAO MENG Diagnosing Phys: Lyman Bishop MD PROCEDURE: After discussion of the risks and benefits of a TEE, an informed consent was obtained from the patient. The transesophogeal probe was passed without difficulty through the esophogus of the patient. Imaged were obtained with the  patient in a supine position. Local oropharyngeal anesthetic was provided with Cetacaine. Sedation performed by different  physician. The patient was monitored while under deep sedation. Anesthestetic sedation was provided intravenously by Anesthesiology: 236m of Propofol, 1075mof Lidocaine. Image quality was good. The patient's vital signs; including heart rate, blood pressure, and oxygen saturation; remained stable throughout the procedure. The patient developed no complications during the procedure. Cardioversion not attempted due to LAA thrombus. IMPRESSIONS  1. Left ventricular ejection fraction, by estimation, is 35 to 40%. The left ventricle has moderately decreased function. The left ventricle demonstrates global hypokinesis. There is severe left ventricular hypertrophy.  2. Right ventricular systolic function is mildly reduced. The right ventricular size is not well visualized.  3. Left atrial size was mildly dilated. A left atrial/left atrial appendage thrombus was detected. The LAA emptying velocity was 25 cm/s.  4. The mitral valve is grossly normal. Trivial mitral valve regurgitation.  5. The aortic valve is tricuspid. Aortic valve regurgitation is not visualized.  6. There is mild (Grade II) plaque.  7. Cardioversion not attempted due to LAA thrombus. FINDINGS  Left Ventricle: Left ventricular ejection fraction, by estimation, is 35 to 40%. The left ventricle has moderately decreased function. The left ventricle demonstrates global hypokinesis. Definity contrast agent was given IV to delineate the left ventricular endocardial borders. The left ventricular internal cavity size was normal in size. There is severe left ventricular hypertrophy. Right Ventricle: The right ventricular size is not well visualized. Right vetricular wall thickness was not assessed. Right ventricular systolic function is mildly reduced. Left Atrium: Left atrial size was mildly dilated. Spontaneous echo contrast was present in  the left atrium and left atrial appendage. A left atrial/left atrial appendage thrombus was detected. The LAA emptying velocity was 25 cm/s. Right Atrium: Right atrial size was normal in size. Pericardium: Trivial pericardial effusion is present. Mitral Valve: The mitral valve is grossly normal. Trivial mitral valve regurgitation. Tricuspid Valve: The tricuspid valve is grossly normal. Tricuspid valve regurgitation is trivial. Aortic Valve: The aortic valve is tricuspid. Aortic valve regurgitation is not visualized. Pulmonic Valve: The pulmonic valve was normal in structure. Pulmonic valve regurgitation is not visualized. Aorta: The aortic root and ascending aorta are structurally normal, with no evidence of dilitation. There is mild (Grade II) plaque. IAS/Shunts: The interatrial septum appears to be lipomatous. No atrial level shunt detected by color flow Doppler. 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  Disposition   Pt is being discharged home today in improved condition.  Follow-up Plans & Appointments     Follow-up Information    O'Neal, WesCassie FreerD Follow up.   Specialties: Internal Medicine, Cardiology, Radiology Why: The office will call Contact information: [address] [phone]        JorMartiniqueetty G, MD Follow up.   Specialty: Family Medicine Why: Call for appointment, diabetes management Contact information: [address] [phone]           Discharge Instructions    Diet - low sodium heart healthy   Complete by: As directed    Diet Carb Modified   Complete by: As directed    Increase activity slowly   Complete by: As directed       Discharge Medications   Allergies as  of 05/07/2020      Reactions   Ace Inhibitors  Other (See Comments)   unknown   Amlodipine Other (See Comments)   unknown   Atenolol Other (See Comments)   bradycardia   Avandia [rosiglitazone] Other (See Comments)   unknown   Darvon [propoxyphene] Other (See Comments)   unknown   Erythromycin Itching   Hydralazine Other (See Comments)   Burning in throat and chest   Hydrocodone Other (See Comments)   No reaction per patient, but ineffective   Levofloxacin Itching   Morphine And Related Other (See Comments)   Dizzy and hallucianation, vomiting; Willing to try low dose   Percocet [oxycodone-acetaminophen] Other (See Comments)   hallucination   Spironolactone Other (See Comments)   unknown   Tramadol Other (See Comments)   Unknown/does not recall reaction but does not want to take again      Medication List    STOP taking these medications   diltiazem 180 MG 24 hr capsule Commonly known as: Cardizem CD     TAKE these medications   Accu-Chek FastClix Lancet Kit Use to test blood sugar up to 3 times daily   Accu-Chek Guide test strip Generic drug: glucose blood Use to test blood sugar up to 3 times daily   Accu-Chek Guide w/Device Kit 1 Device by Does not apply route 3 (three) times daily.   allopurinol 100 MG tablet Commonly known as: ZYLOPRIM Take 1 tablet (100 mg total) by mouth daily.   apixaban 5 MG Tabs tablet Commonly known as: ELIQUIS Take 1 tablet (5 mg total) by mouth 2 (two) times daily.   aspirin EC 81 MG tablet Take 81 mg by mouth daily. Swallow whole.   atorvastatin 20 MG tablet Commonly known as: LIPITOR Take 1 tablet (20 mg total) by mouth daily. What changed: when to take this   B-D SINGLE USE SWABS REGULAR Pads Use to test blood sugar up to 3 times daily   clotrimazole-betamethasone cream Commonly known as: Lotrisone Apply 1 application topically 2 (two) times daily as needed. What changed: reasons to take this   cyclobenzaprine 5 MG  tablet Commonly known as: FLEXERIL Take 1 tablet (5 mg total) by mouth daily as needed. What changed: when to take this   Fish Oil 1000 MG Caps Take 1,000 mg by mouth daily.   fluticasone 50 MCG/ACT nasal spray Commonly known as: FLONASE Place 1 spray into both nostrils daily as needed for allergies or rhinitis.   furosemide 40 MG tablet Commonly known as: LASIX Take 1 tablet (40 mg total) by mouth daily. What changed:   medication strength  how much to take   gabapentin 600 MG tablet Commonly known as: NEURONTIN Take 1 tablet by mouth twice daily   GERITOL COMPLETE PO Take 1 tablet by mouth daily.   lactulose 10 GM/15ML solution Commonly known as: CHRONULAC Take 20 g by mouth daily as needed for mild constipation.   levothyroxine 150 MCG tablet Commonly known as: SYNTHROID Take 150 mcg by mouth daily before breakfast.   loratadine 10 MG tablet Commonly known as: CLARITIN Take 1 tablet (10 mg total) by mouth daily as needed. What changed: reasons to take this   magnesium oxide 400 (241.3 Mg) MG tablet Commonly known as: MAG-OX Take 1 tablet by mouth once daily   meclizine 25 MG tablet Commonly known as: ANTIVERT Take 25 mg by mouth daily as needed for dizziness.   metoprolol tartrate 100 MG tablet Commonly known as: LOPRESSOR Take 1 tablet (100 mg total) by mouth 2 (two) times daily.  nitroGLYCERIN 0.4 MG SL tablet Commonly known as: NITROSTAT Place 1 tablet (0.4 mg total) under the tongue every 5 (five) minutes as needed for chest pain.   omeprazole 40 MG capsule Commonly known as: PRILOSEC Take 1 capsule (40 mg total) by mouth daily. What changed: when to take this   Ozempic (1 MG/DOSE) 2 MG/1.5ML Sopn Generic drug: Semaglutide (1 MG/DOSE) Inject 0.75 mLs (1 mg total) into the skin once a week. What changed: when to take this   polyethylene glycol 17 g packet Commonly known as: MIRALAX / GLYCOLAX Take 17 g by mouth daily as needed for mild  constipation.   SYSTANE BALANCE OP Place 1 drop into both eyes daily as needed (for dry eyes).   terazosin 5 MG capsule Commonly known as: HYTRIN Take 1 capsule (5 mg total) by mouth daily. What changed: when to take this   Toujeo SoloStar 300 UNIT/ML Solostar Pen Generic drug: insulin glargine (1 Unit Dial) Inject 25 Units into the skin daily. Eat a snack with protein nightly before bedtime. What changed:   how much to take  additional instructions   VITA-C PO Take 1 tablet by mouth daily.   Vitamin D3 50 MCG (2000 UT) capsule Take 2,000 Units by mouth daily.   Voltaren 1 % Gel Generic drug: diclofenac Sodium Apply 1 application topically daily as needed (arthritis pain/hands).          Outstanding Labs/Studies   None  Duration of Discharge Encounter   Greater than 30 minutes including physician time.  Signed, Rosaria Ferries, PA-C 05/07/2020, 6:57 PM

## 2020-05-07 NOTE — Progress Notes (Addendum)
Progress Note  Patient Name: Regina [name] Date of Encounter: 05/07/2020  Primary Cardiologist: Evalina Field, MD  Subjective   Doing better today. Rates are more controlled in the 90-100 range while laying. No chest pain or SOB   Inpatient Medications    Scheduled Meds: . apixaban  5 mg Oral BID  . aspirin EC  81 mg Oral Daily  . atorvastatin  20 mg Oral QHS  . furosemide  40 mg Oral Daily  . insulin aspart  0-20 Units Subcutaneous TID WC  . insulin aspart  0-5 Units Subcutaneous QHS  . insulin aspart  4 Units Subcutaneous TID WC  . insulin glargine  10 Units Subcutaneous QHS  . metoprolol tartrate  100 mg Oral BID  . off the beat book   Does not apply Once   Continuous Infusions: . diltiazem (CARDIZEM) infusion Stopped (05/05/20 1353)   PRN Meds: nitroGLYCERIN, ondansetron (ZOFRAN) IV, polyethylene glycol   Vital Signs    Vitals:   05/07/20 0002 05/07/20 0532 05/07/20 0749 05/07/20 0816  BP: 119/84 107/73 120/83   Pulse: 92 90 93 96  Resp: 18 17 18    Temp: 98.1 F (36.7 C) 98.6 F (37 C) 98.2 F (36.8 C)   TempSrc: Oral Oral Oral   SpO2: 98% 98% 99%   Weight:  100.9 kg      Intake/Output Summary (Last 24 hours) at 05/07/2020 1029 Last data filed at 05/06/2020 2000 Gross per 24 hour  Intake 280 ml  Output --  Net 280 ml   Filed Weights   05/05/20 0329 05/06/20 0425 05/07/20 0532  Weight: 99.8 kg 101 kg 100.9 kg    Physical Exam   GEN: Well nourished, well developed, in no acute distress.  HEENT: Grossly normal.  Neck: Supple, no JVD, carotid bruits, or masses. Cardiac: Irregularly irregular. Radials/DP/PT 2+ and equal bilaterally.  Respiratory:  Respirations regular and unlabored, clear to auscultation bilaterally. Skin: warm and dry, no rash. Neuro:  Strength and sensation are intact. Psych: AAOx3.  Normal affect  Labs    Chemistry Recent Labs  Lab 05/04/20 0004 05/05/20 0456 05/07/20 0428  NA 136 138 135  K 3.6 4.1 4.2  CL  100 102 99  CO2 27 27 26   GLUCOSE 174* 134* 131*  BUN 36* 32* 36*  CREATININE 1.85* 1.61* 1.87*  CALCIUM 9.0 9.4 9.0  GFRNONAA 26* 31* 26*  GFRAA 30* 36* 30*  ANIONGAP 9 9 10      Hematology Recent Labs  Lab 05/03/20 1220  WBC 7.9  RBC 3.45*  HGB 10.0*  HCT 30.7*  MCV 89.0  MCH 29.0  MCHC 32.6  RDW 14.9  PLT 287    Cardiac EnzymesNo results for input(s): TROPONINI in the last 168 hours. No results for input(s): TROPIPOC in the last 168 hours.   BNPNo results for input(s): BNP, PROBNP in the last 168 hours.   DDimer No results for input(s): DDIMER in the last 168 hours.   Radiology    No results found.  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 (Normal SPEP/UPEP 7.6.2021) _____________  2D Echocardiogram6.18.2021  1. Left ventricular ejection fraction, by estimation, is 55 to 60%. The  left ventricle has normal function. The left ventricle has no regional  wall motion abnormalities. There is moderate concentric left ventricular  hypertrophy. Left ventricular  diastolic parameters are consistent with Grade II diastolic dysfunction  (pseudonormalization). Elevated left atrial pressure.  2. Right ventricular systolic function is normal. The right ventricular  size is normal. There is normal pulmonary artery systolic pressure.  3. Left atrial size was mildly dilated.  4. The mitral valve is normal in structure. Mild mitral valve  regurgitation. No evidence of mitral stenosis.  5. The aortic valve is normal in structure. Aortic valve regurgitation is  trivial. No aortic stenosis is present.  6. The inferior vena cava is normal in size with greater than 50%  respiratory variability, suggesting right atrial pressure of 3 mmHg.   _____________  Cardiac Catheterization4.4.2016  HEMODYNAMICS:Aortic pressure 152/57 mmHg; LV pressure 166/9 mmHg; LVEDP 19 mmHg  ANGIOGRAPHIC DATA:The left main coronary artery is normal.  The left anterior descending artery is large and wraps around the left ventricular apex. There is mid eccentric 50% eccentric narrowing after the origin of the first large diagonal. A significant stenosis is not felt to be present..  The left circumflex artery is large and free of any significant obstruction. 3 large obtuse marginals arise from the circumflex. Minimal luminal irregularities are noted..  The right coronary artery is dominant and contains mid vessel eccentric 50-60% narrowing.  LEFT VENTRICULOGRAM:Left ventricular angiogram was not performed. Pressures were recorded. _____________  Transesophageal Echocardiogram7.20.2021  Findings:  1. LEFT VENTRICLE: The left ventricular wall thickness isseverely increased. The left ventricular cavity is smallin size. Wall motion is globally hypokinetic. LVEF is 35-40%.  2. RIGHT VENTRICLE: The right ventricle is normal in structure and function without any thrombus or masses.   3. LEFT ATRIUM: The left atrium is mildly dilatedin size without any thrombus or masses. There isspontaneous echo contrast ("smoke") in the left atrium consistent with a low flow state.  4. LEFT ATRIAL APPENDAGE: The left atrial appendage is large and noted to have smoke and sludge.Definity contrast was administered due to concern for thrombus - this demonstrated very low flow and streaming concerning for sludge. I reviewed the images with Dr. Meda Coffee who was present and we agreed that the was probable sludge/soft thrombus.The appendage hasasinglelobe. Pulse doppler indicates lowflow in the appendage.  5. ATRIAL SEPTUM: The atrial septum appears intact and is free of thrombus and/or masses. There is no evidence for interatrial shunting  by color doppler and saline microbubble.  6. RIGHT ATRIUM: The right atrium is normalin size and function without any thrombus or masses.  7. MITRAL VALVE: The mitral valve is normal in structure and function with trivialregurgitation. There were no vegetations or stenosis.  8. AORTIC VALVE: The aortic valve is trileaflet, normal in structure and function with noregurgitation. There were no vegetations or stenosis  9. TRICUSPID VALVE: The tricuspid valve is normal in structure and function with trivialregurgitation. There were no vegetations or stenosis  10. PULMONIC VALVE: The pulmonic valve is normal in structure and function with noregurgitation. There were no vegetations or stenosis.  11. AORTIC ARCH, ASCENDING AND DESCENDING AORTA: There was grade 2(Katz et. Al, 1992) atherosclerosis of the ascending aorta, aortic arch, or proximal descending aorta.  12. PULMONARY VEINS: Anomalous pulmonary venous returnwas notnoted.  13. PERICARDIUM: The pericardium appeared normal and non-thickened. There is nopericardial effusion.  IMPRESSION:  1. Probable sludge/soft thrombus with low emptying velocities of the large left atrial appendage as visualized with Definity contrast. 2. Mild LAE with moderate smoke 3. Negative for PFO 4. Severe  concentric LV wall thickening 5. LVEF35-40%, global hypokinesis. RECOMMENDATIONS:  1. Recommend longer duration anticoagulation. Consider AAD therapy given difficult to rate control afib and development of cardiomyopathy.  Patient Profile     79 y.o. female with a history of nonobstructive CAD, hypertension, hyperlipidemia, type 2 diabetes mellitus, stage III chronic kidney disease, HFpEF with abnormal PYP scan (normal UPEP/SPEP), and recent diagnosis of persistent atrial fibrillation, who was admitted July 18 with ongoing symptomatic atrial fibrillation.TEE 7/20 w/ probable sludge/soft thrombus, therefore, DCCV not  performed.  Assessment & Plan    1.  Persistent atrial fibrillation: -Admitted 04/24/2020 with atrial fibrillation with RVR initially rate controlled with oral diltiazem with plan for DCCV 3 weeks following therapeutic anticoagulation.  Unfortunately patient was readmitted 05/03/2020 with palpitations and tachycardia treated with IV diltiazem.  She underwent a TEE which unfortunately showed probable sludge and soft thrombus with low emptying velocities of the left atrial appendage therefore cardioversion was deferred. -Echocardiogram with reduced LV function at 35 to 40% with global hypokinesis -Transitioned off IV diltiazem 05/05/2020 in the setting of LV dysfunction and placed on carvedilol 3.125 mg twice daily>>> uptitrated to 12.5 mg p.o. twice daily>> BPs have remained stable therefore we will continue -Consider uptitrating carvedilol to 25 mg p.o. twice daily today -Has history of bradycardia therefore will need to be cautious once sinus rhythm is restored CHA2DS2VASc =7  2.  Nonobstructive CAD/demand acute ischemia: -Prior diagnostic cardiac cath 04/2015 with nonobstructive CAD\ -Given new LV dysfunction plan is to pursue outpatient ischemic testing following restoration of NSR -Continue ASA, statin  3.  Chronic systolic HF: -Previous normal EF per echo 03/2020 with reduced LV function to 35 to 40% on TEE 05/05/2020 -Remains euvolemic -Continue current dose of Lasix 40 mg daily -Continue carvedilol -No ACE/ARB/ARNI in the setting of renal disease  4.  HTN: -Stable, 107/73, 119/84, 114/81 -Continue current regimen>> consider uptitrating carvedilol to 25 mg p.o. twice daily   5.  HLD: -Last LDL, 49 03/2020 -Continue statin  6.  CKD stage III: -Creatinine, 1.87 today which appears close to her baseline of 1.8  7.  Normocytic anemia: -Stable at 10.0 05/03/2020 -No signs of acute bleeding  8.  DM2: -Hemoglobin A1c, 7.6 and 03/2020 -SSI for glucose control inpatient  status   Signed, Kathyrn Drown NP-C [facility] Pager: [phone] 05/07/2020, 10:29 AM     For questions or updates, please contact   Please consult [email or web address] for contact info under Cardiology/STEMI.  The patient was seen, examined and discussed with Kathyrn Drown, NP  and I agree with the above.   Improved rates, mostly 90-100, but still up to 110-120', BP soft, I will switch carvedilol to metoprolol 100 mg PO BID and possibly discharge this afternoon if HRs better controlled.  Ena Dawley, MD 05/07/2020

## 2020-05-08 ENCOUNTER — Telehealth: Payer: Self-pay | Admitting: Family Medicine

## 2020-05-08 NOTE — Telephone Encounter (Signed)
Transition Care Management Follow-up Telephone Call  Date of discharge and from where: 05/07/2020 from [facility]   How have you been since you were released from the hospital? Patient states she is doing better just a little weak  Any questions or concerns? No   Items Reviewed:  Did the pt receive and understand the discharge instructions provided? Yes   Medications obtained and verified? Yes   Any new allergies since your discharge? No   Dietary orders reviewed? Yes  Do you have support at home? Yes  is currently staying with her son for a weeks   Functional Questionnaire: (I = Independent and D = Dependent) ADLs: I  Bathing/Dressing- I  Meal Prep- I  Eating- I  Maintaining continence- I  Transferring/Ambulation- I  Managing Meds- I  Follow up appointments reviewed:   PCP Hospital f/u appt confirmed? Yes  Scheduled to see Dr. [name]  on 05/18/2020 @ 11:00 am.  [facility] f/u appt confirmed? Yes  Scheduled to see Dr. Audie Box on 07/10/2020 @ 8:40 am.  Are transportation arrangements needed? No   If their condition worsens, is the pt aware to call PCP or go to the Emergency Dept.? Yes  Was the patient provided with contact information for the PCP's office or ED? Yes  Was to pt encouraged to call back with questions or concerns? Yes

## 2020-05-09 ENCOUNTER — Telehealth: Payer: Self-pay | Admitting: Medical

## 2020-05-09 NOTE — Telephone Encounter (Signed)
   Patient called the after hours line to report blood pressures were low. She has been monitoring her BP closely since discharge from the hospital 05/07/20 after an admission for atrial fibrillation with RVR c/b acute combined CHF and LA appendage thrombus. BP readings over the past couple days: 7/22: 134/72. 7/23: 111/70 am; 101/68 pm with HR's in the 80s-100s. She took her blood pressure this morning with initial read of 91/55 around 8:35am. Repeat BP 72/56 at 8:45am. No complaints of dizziness, lightheadedness, syncope, chest pain, SOB, or LE edema. She notes 3lb weight gain overnight. Repeat vitals at the time of this call: 100/83 HR 100. Instructed patient to take lasix and apixaban at this time, wait a couple hours, then take her metoprolol as long as SBP is >100. Advised to change positions slowly to avoid orthostatic hypotension. She was in agreement with the plan and appreciative of the call.   Abigail Butts, PA-C 05/09/20; 9:16 AM

## 2020-05-11 NOTE — Telephone Encounter (Signed)
Patient called with increase fluid retention and bloating in the setting of increase fluid intake.  Weight increase from 220-230 lbs.  Discussed increase to lasix 40 mg BID.  Patient will call in with results of treatment.  If no improvement, may benefit from early follow up appt.   Patient had questions of fluid restriction.  Recommended 2 L fluid restriction at maximum.  Patient had no further questions.  Rudean Haskell MD

## 2020-05-12 ENCOUNTER — Other Ambulatory Visit: Payer: Self-pay | Admitting: Family Medicine

## 2020-05-12 NOTE — Telephone Encounter (Signed)
Thanks.  Almyra Free:  Can we get her in this week?  [name], [place]  [address], [place],  [address] [phone]  8:32 AM

## 2020-05-12 NOTE — Telephone Encounter (Signed)
Called patient- LVM to call back to get scheduled for this week.  Thanks!

## 2020-05-12 NOTE — Telephone Encounter (Signed)
Spoke with pt who states she "called in" last night because she felt as if she had fluid in the top of her stomach. Pt is unsure who she spoke with.  Pt states she was miserable anytime she ate or drank anything.  Pt reports her weight yesterday am was 225lb and last night it was 230lb and that is why she called.  Pt states she was advised to take another fluid pill which she did.  Pt reports her weight again this am was 225lb and she is feeling better.  Pt has an appointment with her PCP scheduled for 05/18/2020 and with Rosaria Ferries, PA 05/28/2020.  Pt advised to monitor her Na+ and fluid intake, continue meds as prescribed. Advised pt the best time to weigh is in the am after she has gone to the bathroom and with same clothes on each am  Will forward information to Caprice Beaver, LPN to schedule pt for earlier appointment.  Pt will go to ED for any CP, severe SOB, dizziness or weakness.  Pt verbalizes understanding and agrees with current plan.

## 2020-05-12 NOTE — Telephone Encounter (Signed)
Follow Up  Patient is returning call back about the edema. Please give patient a call back.

## 2020-05-13 ENCOUNTER — Other Ambulatory Visit (INDEPENDENT_AMBULATORY_CARE_PROVIDER_SITE_OTHER): Payer: Self-pay | Admitting: Family Medicine

## 2020-05-13 NOTE — Telephone Encounter (Signed)
Attempted to contact patient to get set up for sooner appointment.  Patient did not answer- LVM for her to call back, left call back number.

## 2020-05-13 NOTE — Telephone Encounter (Signed)
Called again today to get patient scheduled for appointment. LVM to call back.

## 2020-05-14 DIAGNOSIS — G4733 Obstructive sleep apnea (adult) (pediatric): Secondary | ICD-10-CM | POA: Diagnosis not present

## 2020-05-15 ENCOUNTER — Telehealth: Payer: Self-pay | Admitting: Cardiovascular Disease

## 2020-05-15 NOTE — Telephone Encounter (Signed)
Pt c/o BP issue: STAT if pt c/o blurred vision, one-sided weakness or slurred speech  1. What are your last 5 BP readings?  BP: 91/49 HR: 67  2. Are you having any other symptoms (ex. Dizziness, headache, blurred vision, passed out)? No  3. What is your BP issue? Patient is following up to report an additional BP reading. She states after speaking with Dr. Audie Box she took her BP again.

## 2020-05-15 NOTE — Telephone Encounter (Addendum)
The patient called back to give a blood pressure reading for Dr. Audie Box. Her blood pressure was 91/49 and HR 67. She did take the Metoprolol 50 mg this morning (half a tablet).

## 2020-05-15 NOTE — Telephone Encounter (Signed)
Called Regina [name]. She has low Bps SBP~90. Hrs controlled. We will reduce her metoprolol to 50 mg BID. I will go ahead and order the invitae cardiac amyloidosis panel. I will reach out to Dr. Sung Amabile about referral to his clinic as well.   [name], [place]  [address] [place], [place] [address] [phone]  8:36 AM

## 2020-05-15 NOTE — Telephone Encounter (Signed)
Dr.O'Neal called and spoke to patient-  Will keep appointment next week with PA. 08/12

## 2020-05-16 ENCOUNTER — Other Ambulatory Visit: Payer: Self-pay | Admitting: Family Medicine

## 2020-05-18 ENCOUNTER — Encounter: Payer: Self-pay | Admitting: Family Medicine

## 2020-05-18 ENCOUNTER — Other Ambulatory Visit: Payer: Self-pay

## 2020-05-18 ENCOUNTER — Ambulatory Visit (INDEPENDENT_AMBULATORY_CARE_PROVIDER_SITE_OTHER): Payer: Medicare Other | Admitting: Family Medicine

## 2020-05-18 VITALS — BP 100/60 | HR 91 | Temp 98.5°F | Resp 16 | Ht 62.0 in | Wt 222.5 lb

## 2020-05-18 DIAGNOSIS — I10 Essential (primary) hypertension: Secondary | ICD-10-CM

## 2020-05-18 DIAGNOSIS — I4891 Unspecified atrial fibrillation: Secondary | ICD-10-CM

## 2020-05-18 DIAGNOSIS — K219 Gastro-esophageal reflux disease without esophagitis: Secondary | ICD-10-CM | POA: Diagnosis not present

## 2020-05-18 DIAGNOSIS — E039 Hypothyroidism, unspecified: Secondary | ICD-10-CM | POA: Diagnosis not present

## 2020-05-18 DIAGNOSIS — I513 Intracardiac thrombosis, not elsewhere classified: Secondary | ICD-10-CM | POA: Diagnosis not present

## 2020-05-18 MED ORDER — METOPROLOL TARTRATE 100 MG PO TABS: 50.0000 mg | ORAL_TABLET | Freq: Two times a day (BID) | ORAL | 3 refills | Status: DC

## 2020-05-18 NOTE — Assessment & Plan Note (Addendum)
Rate is better controlled, mildly arrhythmic today. Continue Metoprolol and Eliquis. Following with cardiologist.

## 2020-05-18 NOTE — Patient Instructions (Signed)
A few things to remember from today's visit:   Because low blood pressure let's stop Terazosin, take it every other day for 1 week and stop. Try to take thyroid med daily, 45-60 min before breakfast. Continue monitoring blood pressures and pulse.  If blood pressure goes up we can increase Metoprolol dose.  If you need refills please call your pharmacy. Do not use My Chart to request refills or for acute issues that need immediate attention.    Please be sure medication list is accurate. If a new problem present, please set up appointment sooner than planned today.

## 2020-05-18 NOTE — Progress Notes (Signed)
HPI: [name] is a 79 y.o. female with Hx of DM II,HTN,HLD,atrial fib,peripheral neuropathy, and OSA here today to follow on recent hospitalization. She was last seen here in the office on 05/01/2020 also for hospital follow-up.  She was hospitalized from 05/03/2020 to 05/07/2020. Admitted because of palpitation tachycardia.  The plan was to treat with cardioversion back echo done on 05/05/2020 showed a possible/chance of thrombosis, so cardioversion was deferred. LVEF 30 to 40% with global hypokinesis. Diltiazem was titrated off because of left ventricular dysfunction. She was started on carvedilol 12.5 mg twice daily but because still tachycardic, it was changed to metoprolol.  Troponin elevated at 211,correlated to elevated HR, so thought to be secondary to demand ischemia. She is on Metoprolol 100 mg 1/2 bid  She was having chest "heavyness" a few days ago,called her cardiologist and Furosemide dose was increased. Negative for heartburn,nausea,or vomiting. She is on Omeprazole 40 mg daily.  She decreased fluid intake and feeling better. CKD, has not seen nephrologist yet.  Lab Results  Component Value Date   CREATININE 1.87 (H) 05/07/2020   BUN 36 (H) 05/07/2020   NA 135 05/07/2020   K 4.2 05/07/2020   CL 99 05/07/2020   CO2 26 05/07/2020   Negative fir CP,SOB,orthopnea,PND,or worsening edema.  She is on Eliquis 5 mg bid and Aspirin 81 mg daily.  HR: 80-90's.  She is monitor BP at home bid and has had some low BP's, 120/40-50's.  Hypothyroidism: States that she has not been taking Levothyroxine daily, sometimes she forgets.  Lab Results  Component Value Date   TSH 5.783 (H) 04/25/2020    Review of Systems  Constitutional: Positive for fatigue. Negative for activity change, appetite change and fever.  HENT: Negative for mouth sores, nosebleeds and sore throat.   Eyes: Negative for redness and visual disturbance.  Respiratory: Negative for cough and  wheezing.   Cardiovascular: Positive for palpitations.  Gastrointestinal: Negative for abdominal pain.       Negative for changes in bowel habits.  Endocrine: Negative for cold intolerance and heat intolerance.  Genitourinary: Negative for decreased urine volume, dysuria and hematuria.  Neurological: Positive for dizziness (When BP is low.). Negative for syncope, weakness and headaches.  Psychiatric/Behavioral: Negative for confusion.  Rest see pertinent positives and negatives per HPI.  Current Outpatient Medications on File Prior to Visit  Medication Sig Dispense Refill  . Alcohol Swabs (B-D SINGLE USE SWABS REGULAR) PADS Use to test blood sugar up to 3 times daily 100 each 3  . allopurinol (ZYLOPRIM) 100 MG tablet Take 1 tablet (100 mg total) by mouth daily. 30 tablet 3  . apixaban (ELIQUIS) 5 MG TABS tablet Take 1 tablet (5 mg total) by mouth 2 (two) times daily. 60 tablet 11  . Ascorbic Acid (VITA-C PO) Take 1 tablet by mouth daily.     Marland Kitchen aspirin EC 81 MG tablet Take 81 mg by mouth daily. Swallow whole.    Marland Kitchen atorvastatin (LIPITOR) 20 MG tablet Take 1 tablet (20 mg total) by mouth daily. (Patient taking differently: Take 20 mg by mouth at bedtime. ) 90 tablet 2  . Blood Glucose Monitoring Suppl (ACCU-CHEK GUIDE) w/Device KIT 1 Device by Does not apply route 3 (three) times daily. 1 kit 0  . Cholecalciferol (VITAMIN D3) 50 MCG (2000 UT) capsule Take 2,000 Units by mouth daily.    . clotrimazole-betamethasone (LOTRISONE) cream Apply 1 application topically 2 (two) times daily as needed. (Patient taking differently: Apply  1 application topically 2 (two) times daily as needed (rash). ) 30 g 1  . cyclobenzaprine (FLEXERIL) 5 MG tablet TAKE 1 TABLET BY MOUTH ONCE DAILY AS NEEDED 30 tablet 0  . diclofenac Sodium (VOLTAREN) 1 % GEL Apply 1 application topically daily as needed (arthritis pain/hands).    . fluticasone (FLONASE) 50 MCG/ACT nasal spray Place 1 spray into both nostrils daily as needed  for allergies or rhinitis.    . furosemide (LASIX) 40 MG tablet Take 1 tablet (40 mg total) by mouth daily. 30 tablet 3  . gabapentin (NEURONTIN) 600 MG tablet Take 1 tablet by mouth twice daily 60 tablet 0  . glucose blood (ACCU-CHEK GUIDE) test strip Use to test blood sugar up to 3 times daily 100 each 3  . insulin glargine, 1 Unit Dial, (TOUJEO SOLOSTAR) 300 UNIT/ML Solostar Pen Inject 25 Units into the skin daily. Eat a snack with protein nightly before bedtime. 9 mL 0  . Iron-Vitamins (GERITOL COMPLETE PO) Take 1 tablet by mouth daily.    Marland Kitchen lactulose (CHRONULAC) 10 GM/15ML solution Take 20 g by mouth daily as needed for mild constipation.     . Lancets Misc. (ACCU-CHEK FASTCLIX LANCET) KIT Use to test blood sugar up to 3 times daily 1 kit 1  . levothyroxine (SYNTHROID) 150 MCG tablet Take 150 mcg by mouth daily before breakfast.     . loratadine (CLARITIN) 10 MG tablet Take 1 tablet (10 mg total) by mouth daily as needed. (Patient taking differently: Take 10 mg by mouth daily as needed for allergies. ) 90 tablet 0  . magnesium oxide (MAG-OX) 400 (241.3 Mg) MG tablet Take 1 tablet by mouth once daily 90 tablet 0  . meclizine (ANTIVERT) 25 MG tablet Take 25 mg by mouth daily as needed for dizziness.     . nitroGLYCERIN (NITROSTAT) 0.4 MG SL tablet Place 1 tablet (0.4 mg total) under the tongue every 5 (five) minutes as needed for chest pain. 25 tablet 2  . Omega-3 Fatty Acids (FISH OIL) 1000 MG CAPS Take 1,000 mg by mouth daily.     Marland Kitchen omeprazole (PRILOSEC) 40 MG capsule Take 1 capsule (40 mg total) by mouth daily. (Patient taking differently: Take 40 mg by mouth daily before breakfast. ) 30 capsule 3  . OZEMPIC, 1 MG/DOSE, 2 MG/1.5ML SOPN Inject 0.75 mLs (1 mg total) into the skin once a week. (Patient taking differently: Inject 1 mg into the skin every Wednesday. ) 3 mL 0  . polyethylene glycol (MIRALAX / GLYCOLAX) 17 g packet Take 17 g by mouth daily as needed for mild constipation. 14 each 0   . Propylene Glycol (SYSTANE BALANCE OP) Place 1 drop into both eyes daily as needed (for dry eyes).     No current facility-administered medications on file prior to visit.   Past Medical History:  Diagnosis Date  . (HFpEF) heart failure with preserved ejection fraction ([place])   . Back pain   . Diabetes mellitus without complication ([place])   . Dyspnea   . GERD (gastroesophageal reflux disease)   . H/O echocardiogram    a. 01/2015 Echo: EF 55-60%, Gr 2 DD, mod LVH, mildly dil LA.  Marland Kitchen Heart disease   . Hypertension   . Hypothyroidism   . Joint pain   . Kidney problem   . Lower extremity edema   . Mini stroke ([place])   . Non-obstructive CAD    a. 01/2015 Cardiolite: + inf wall ischemia, EF 56%;  b.  01/2015 Cath: LM nl, LAD 69m LCX min irregs, RCA dominant, 50-662m . Sleep apnea   . Swallowing difficulty   . Thyroid disease    Allergies  Allergen Reactions  . Ace Inhibitors Other (See Comments)    unknown  . Amlodipine Other (See Comments)    unknown  . Atenolol Other (See Comments)    bradycardia  . Avandia [Rosiglitazone] Other (See Comments)    unknown  . Darvon [Propoxyphene] Other (See Comments)    unknown  . Erythromycin Itching  . Hydralazine Other (See Comments)    Burning in throat and chest  . Hydrocodone Other (See Comments)    No reaction per patient, but ineffective  . Levofloxacin Itching  . Morphine And Related Other (See Comments)    Dizzy and hallucianation, vomiting; Willing to try low dose  . Percocet [Oxycodone-Acetaminophen] Other (See Comments)    hallucination  . Spironolactone Other (See Comments)    unknown  . Tramadol Other (See Comments)    Unknown/does not recall reaction but does not want to take again    Social History   Socioeconomic History  . Marital status: Widowed    Spouse name: Not on file  . Number of children: Not on file  . Years of education: Not on file  . Highest education level: Not on file  Occupational History  .  Occupation: Retired  Tobacco Use  . Smoking status: Never Smoker  . Smokeless tobacco: Never Used  Vaping Use  . Vaping Use: Never used  Substance and Sexual Activity  . Alcohol use: No  . Drug use: Not on file  . Sexual activity: Not Currently  Other Topics Concern  . Not on file  Social History Narrative   Lives with daughter   Social Determinants of Health   Financial Resource Strain:   . Difficulty of Paying Living Expenses:   Food Insecurity:   . Worried About RuCharity fundraisern the Last Year:   . RaArboriculturistn the Last Year:   Transportation Needs:   . LaFilm/video editorMedical):   . Marland Kitchenack of Transportation (Non-Medical):   Physical Activity:   . Days of Exercise per Week:   . Minutes of Exercise per Session:   Stress:   . Feeling of Stress :   Social Connections:   . Frequency of Communication with Friends and Family:   . Frequency of Social Gatherings with Friends and Family:   . Attends Religious Services:   . Active Member of Clubs or Organizations:   . Attends ClArchivisteetings:   . Marland Kitchenarital Status:     Vitals:   05/18/20 1041  BP: (!) 100/60  Pulse: 91  Resp: 16  Temp: 98.5 F (36.9 C)  SpO2: 99%   Body mass index is 40.7 kg/m.  Physical Exam Vitals and nursing note reviewed.  Constitutional:      General: She is not in acute distress.    Appearance: She is well-developed.  HENT:     Head: Normocephalic and atraumatic.  Eyes:     Conjunctiva/sclera: Conjunctivae normal.     Pupils: Pupils are equal, round, and reactive to light.  Cardiovascular:     Rate and Rhythm: Normal rate. Rhythm irregular.     Pulses:          Dorsalis pedis pulses are 2+ on the right side and 2+ on the left side.     Heart sounds: No murmur heard.  Pulmonary:     Effort: Pulmonary effort is normal. No respiratory distress.     Breath sounds: Normal breath sounds.  Abdominal:     Palpations: Abdomen is soft. There is no hepatomegaly or  mass.     Tenderness: There is no abdominal tenderness.  Musculoskeletal:     Comments: Trace pitting LE edema,bilateral.  Lymphadenopathy:     Cervical: No cervical adenopathy.  Skin:    General: Skin is warm.     Findings: No erythema or rash.  Neurological:     Mental Status: She is alert and oriented to person, place, and time.     Cranial Nerves: No cranial nerve deficit.     Gait: Gait normal.  Psychiatric:     Comments: Well groomed, good eye contact.    ASSESSMENT AND PLAN:  Ms. Denis was seen today for hospitalization follow-up.  Diagnoses and all orders for this visit:  Gastroesophageal reflux disease, unspecified whether esophagitis present It seems to be well controlled. Continue Omeprazole 40 mg daily. GERD precautions.  Thrombus of left atrial appendage Sludge vs soft thrombus. Pending TEE for follow up. On Eliquis and Aspirin.  Hypothyroidism She will try to be compliant with taking Levothyroxine and we can repeat TSH in 6 weeks.  Essential hypertension BP readings running low. Recommend stopping Terazosin 5 mg , she can take it q 2 days for a week and then stop. If BP goes up we can increase dose of Metoprolol back to 100 mg bid, for now continue 50 mg bid. Continue monitoring BP regularly.  Atrial fibrillation with RVR (HCC) Rate is better controlled, mildly arrhythmic today. Continue Metoprolol and Eliquis. Following with cardiologist.  Return for Keep next appt..   Amalea Ottey G. [name], MD  [facility] [place] office.   A few things to remember from today's visit:   Because low blood pressure let's stop Terazosin, take it every other day for 1 week and stop. Try to take thyroid med daily, 45-60 min before breakfast. Continue monitoring blood pressures and pulse.  If blood pressure goes up we can increase Metoprolol dose.  If you need refills please call your pharmacy. Do not use My Chart to request refills or for acute issues  that need immediate attention.    Please be sure medication list is accurate. If a new problem present, please set up appointment sooner than planned today.

## 2020-05-18 NOTE — Assessment & Plan Note (Signed)
BP readings running low. Recommend stopping Terazosin 5 mg , she can take it q 2 days for a week and then stop. If BP goes up we can increase dose of Metoprolol back to 100 mg bid, for now continue 50 mg bid. Continue monitoring BP regularly.

## 2020-05-18 NOTE — Assessment & Plan Note (Signed)
She will try to be compliant with taking Levothyroxine and we can repeat TSH in 6 weeks.

## 2020-05-19 DIAGNOSIS — R931 Abnormal findings on diagnostic imaging of heart and coronary circulation: Secondary | ICD-10-CM | POA: Diagnosis not present

## 2020-05-21 ENCOUNTER — Encounter: Payer: Self-pay | Admitting: Family Medicine

## 2020-05-25 ENCOUNTER — Telehealth: Payer: Self-pay | Admitting: Cardiovascular Disease

## 2020-05-25 MED ORDER — PANTOPRAZOLE SODIUM 40 MG PO TBEC
40.0000 mg | DELAYED_RELEASE_TABLET | Freq: Every day | ORAL | 11 refills | Status: DC
Start: 2020-05-25 — End: 2020-09-16

## 2020-05-25 NOTE — Telephone Encounter (Signed)
Patient states she has been experiencing heaviness around the left side of her chest and breast. She denies pain or additional symptoms. Patient has an appointment scheduled for 05/28/20 with Rosaria Ferries. However, she is requesting a call back to discuss.

## 2020-05-25 NOTE — Telephone Encounter (Signed)
Switch to protonix 40 mg QD. Stop omeprazole. Make sure she is taking correctly. 30 min before she eats anything in the morning with 8 ounces of water.   Can you switch her over to see me Thursday? OK to overbook.   [name], [place]  [address], [place], [place] [address] [phone]  1:23 PM

## 2020-05-25 NOTE — Telephone Encounter (Signed)
Returned call to patient who reports chest heaviness off and on since she left the hospital July 22. The heaviness is at top of stomach, in between breasts, below breasts. The episodes occur most often in the morning and after eating. She develops a fullness while eating and it "weighs her down". She takes a daily Tums since Sunday. She takes omeprazole daily and this does not help symptoms. She has not taken NTG. She has some shortness of breath with exertion.   She has office visit with Suanne Marker PA on 8/12  Message sent to MD for recommendations/advice

## 2020-05-25 NOTE — Telephone Encounter (Signed)
Patient aware of med change and appt change from [facility] [place] on 8/12 @ 1115am to Dr. Audie Box 8/12 @ 10am

## 2020-05-26 NOTE — Progress Notes (Signed)
Cardiology Office Note:   Date:  05/28/2020  NAME:  Regina [name]    MRN: [id number] DOB:  1940-12-01   PCP:  [name], [name], MD  Cardiologist:  Evalina Field, MD  Electrophysiologist:  None   Referring MD: [name], [name], MD   Chief Complaint  Patient presents with  . Atrial Fibrillation   History of Present Illness:   Regina [name] is a 79 y.o. female with a hx of ATTR cardiac amyloidosis, persistent Afib (LAA thrombus?), CKD, HTN, DM, non-obstructive CAD who presents for follow-up. Since our last visit has been admitted twice for Afib with RVR. EF slightly down on recent TEE. DCCV not performed due to LAA sludge.   She reports for the last several weeks she has had decreased appetite and bloating.  She reports abdominal pressure after she eats.  She reports when she exerts herself she does get short of breath.  She is had no further heart racing spells.  Her weights have increased roughly 5 pounds since she left the hospital.  She has been hospitalized twice since her last visit.  Her heart rate today is in the 90s.  She remains on metoprolol tartrate 50 mg twice daily.  She is been taken off of the blood pressure medications.  I do have concerns about her cardiomyopathy progressing.  She does have volume on her today.  I suspect she is not tolerant atrial fibrillation and having worsening heart failure.  She denies any chest pain or pressure.  Her symptoms mainly appear to be abdominal bloating and shortness of breath.  Problem List 1. ATTR Amyloid/HFrEF -Grade 2DD -positive PYP (3 hours 1.4 H/CL; visually grade 3) -negative SPEP/UPEP -EF 35-40% in Afib with RVR 2. Persistent Afib/LAA slugde vs thrombus 05/05/2020  3. DM -A1c 7.6 4. HLD -T chol 98, HDL 44, LDL 30, TG 119 5. HTN 6. Non-obstructive CAD -LHC 01/19/2015 [facility] -50% mid RCA and 50% LAD 7. OSA 8. CKD 3  Past Medical History: Past Medical History:  Diagnosis Date  . (HFpEF) heart failure with  preserved ejection fraction ([place])   . Back pain   . Diabetes mellitus without complication ([place])   . Dyspnea   . GERD (gastroesophageal reflux disease)   . H/O echocardiogram    a. 01/2015 Echo: EF 55-60%, Gr 2 DD, mod LVH, mildly dil LA.  Marland Kitchen Heart disease   . Hypertension   . Hypothyroidism   . Joint pain   . Kidney problem   . Lower extremity edema   . Mini stroke ([place])   . Non-obstructive CAD    a. 01/2015 Cardiolite: + inf wall ischemia, EF 56%;  b. 01/2015 Cath: LM nl, LAD 81m LCX min irregs, RCA dominant, 50-685m . Sleep apnea   . Swallowing difficulty   . Thyroid disease     Past Surgical History: Past Surgical History:  Procedure Laterality Date  . ABDOMINAL HYSTERECTOMY    . CARDIAC CATHETERIZATION    . ESOPHAGOGASTRODUODENOSCOPY     a. 01/2015 EGD: patent esophagus.  . ESOPHAGOGASTRODUODENOSCOPY N/A 01/20/2015   Procedure: ESOPHAGOGASTRODUODENOSCOPY (EGD);  Surgeon: PaCarol AdaMD;  [facility];  Service: Endoscopy;  Laterality: N/A;  . HAND SURGERY    . JOINT REPLACEMENT     reports history bilateral TKA and right TSA  . LEFT HEART CATHETERIZATION WITH CORONARY ANGIOGRAM N/A 01/19/2015  . TEE WITHOUT CARDIOVERSION  05/04/2020  . TEE WITHOUT CARDIOVERSION N/A 05/05/2020   Procedure: TRANSESOPHAGEAL ECHOCARDIOGRAM (TEE);  Surgeon: Pixie Casino, MD;  [facility];  Service: Cardiovascular;  Laterality: N/A;  . THYROIDECTOMY      Current Medications: Current Meds  Medication Sig  . Alcohol Swabs (B-D SINGLE USE SWABS REGULAR) PADS Use to test blood sugar up to 3 times daily  . allopurinol (ZYLOPRIM) 100 MG tablet Take 1 tablet (100 mg total) by mouth daily.  Marland Kitchen apixaban (ELIQUIS) 5 MG TABS tablet Take 1 tablet (5 mg total) by mouth 2 (two) times daily.  . Ascorbic Acid (VITA-C PO) Take 1 tablet by mouth daily.   Marland Kitchen aspirin EC 81 MG tablet Take 81 mg by mouth daily. Swallow whole.  Marland Kitchen atorvastatin (LIPITOR) 20 MG tablet Take 1 tablet (20 mg total)  by mouth daily. (Patient taking differently: Take 20 mg by mouth at bedtime. )  . Blood Glucose Monitoring Suppl (ACCU-CHEK GUIDE) w/Device KIT 1 Device by Does not apply route 3 (three) times daily.  . Cholecalciferol (VITAMIN D3) 50 MCG (2000 UT) capsule Take 2,000 Units by mouth daily.  . clotrimazole-betamethasone (LOTRISONE) cream Apply 1 application topically 2 (two) times daily as needed. (Patient taking differently: Apply 1 application topically 2 (two) times daily as needed (rash). )  . cyclobenzaprine (FLEXERIL) 5 MG tablet TAKE 1 TABLET BY MOUTH ONCE DAILY AS NEEDED  . diclofenac Sodium (VOLTAREN) 1 % GEL Apply 1 application topically daily as needed (arthritis pain/hands).  . fluticasone (FLONASE) 50 MCG/ACT nasal spray Place 1 spray into both nostrils daily as needed for allergies or rhinitis.  . furosemide (LASIX) 40 MG tablet Take 1 tablet (40 mg total) by mouth daily.  Marland Kitchen gabapentin (NEURONTIN) 600 MG tablet Take 1 tablet by mouth twice daily  . glucose blood (ACCU-CHEK GUIDE) test strip Use to test blood sugar up to 3 times daily  . insulin glargine, 1 Unit Dial, (TOUJEO SOLOSTAR) 300 UNIT/ML Solostar Pen Inject 25 Units into the skin daily. Eat a snack with protein nightly before bedtime.  . Iron-Vitamins (GERITOL COMPLETE PO) Take 1 tablet by mouth daily.  Marland Kitchen lactulose (CHRONULAC) 10 GM/15ML solution Take 20 g by mouth daily as needed for mild constipation.   . Lancets Misc. (ACCU-CHEK FASTCLIX LANCET) KIT Use to test blood sugar up to 3 times daily  . levothyroxine (SYNTHROID) 150 MCG tablet Take 150 mcg by mouth daily before breakfast.   . loratadine (CLARITIN) 10 MG tablet Take 1 tablet (10 mg total) by mouth daily as needed. (Patient taking differently: Take 10 mg by mouth daily as needed for allergies. )  . magnesium oxide (MAG-OX) 400 (241.3 Mg) MG tablet Take 1 tablet by mouth once daily  . meclizine (ANTIVERT) 25 MG tablet Take 25 mg by mouth daily as needed for dizziness.     . metoprolol tartrate (LOPRESSOR) 100 MG tablet Take 0.5 tablets (50 mg total) by mouth 2 (two) times daily.  . nitroGLYCERIN (NITROSTAT) 0.4 MG SL tablet Place 1 tablet (0.4 mg total) under the tongue every 5 (five) minutes as needed for chest pain.  . Omega-3 Fatty Acids (FISH OIL) 1000 MG CAPS Take 1,000 mg by mouth daily.   Marland Kitchen OZEMPIC, 1 MG/DOSE, 2 MG/1.5ML SOPN Inject 0.75 mLs (1 mg total) into the skin once a week. (Patient taking differently: Inject 1 mg into the skin every Wednesday. )  . pantoprazole (PROTONIX) 40 MG tablet Take 1 tablet (40 mg total) by mouth daily. Take 30 minutes before eating in the morning with 8oz water  . polyethylene glycol (MIRALAX /  GLYCOLAX) 17 g packet Take 17 g by mouth daily as needed for mild constipation.  Marland Kitchen Propylene Glycol (SYSTANE BALANCE OP) Place 1 drop into both eyes daily as needed (for dry eyes).  . [DISCONTINUED] apixaban (ELIQUIS) 5 MG TABS tablet Take 1 tablet (5 mg total) by mouth 2 (two) times daily.  . [DISCONTINUED] metoprolol tartrate (LOPRESSOR) 100 MG tablet Take 0.5 tablets (50 mg total) by mouth 2 (two) times daily.     Allergies:    Ace inhibitors, Amlodipine, Atenolol, Avandia [rosiglitazone], Darvon [propoxyphene], Erythromycin, Hydralazine, Hydrocodone, Levofloxacin, Morphine and related, Percocet [oxycodone-acetaminophen], Spironolactone, and Tramadol   Social History: Social History   Socioeconomic History  . Marital status: Widowed    Spouse name: Not on file  . Number of children: Not on file  . Years of education: Not on file  . Highest education level: Not on file  Occupational History  . Occupation: Retired  Tobacco Use  . Smoking status: Never Smoker  . Smokeless tobacco: Never Used  Vaping Use  . Vaping Use: Never used  Substance and Sexual Activity  . Alcohol use: No  . Drug use: Not on file  . Sexual activity: Not Currently  Other Topics Concern  . Not on file  Social History Narrative   Lives with  daughter   Social Determinants of Health   Financial Resource Strain:   . Difficulty of Paying Living Expenses:   Food Insecurity:   . Worried About Charity fundraiser in the Last Year:   . Arboriculturist in the Last Year:   Transportation Needs:   . Film/video editor (Medical):   Marland Kitchen Lack of Transportation (Non-Medical):   Physical Activity:   . Days of Exercise per Week:   . Minutes of Exercise per Session:   Stress:   . Feeling of Stress :   Social Connections:   . Frequency of Communication with Friends and Family:   . Frequency of Social Gatherings with Friends and Family:   . Attends Religious Services:   . Active Member of Clubs or Organizations:   . Attends Archivist Meetings:   Marland Kitchen Marital Status:      Family History: The patient's family history includes Alcoholism in her father; Cancer in her father; Heart disease in her mother; Hypertension in her mother.  ROS:   All other ROS reviewed and negative. Pertinent positives noted in the HPI.     EKGs/Labs/Other Studies Reviewed:   The following studies were personally reviewed by me today:  EKG:  EKG is ordered today.  The ekg ordered today demonstrates atrial fibrillation, heart rate 94, single PVC noted, and was personally reviewed by me.   TTE 04/03/2020 1. Left ventricular ejection fraction, by estimation, is 55 to 60%. The  left ventricle has normal function. The left ventricle has no regional  wall motion abnormalities. There is moderate concentric left ventricular  hypertrophy. Left ventricular  diastolic parameters are consistent with Grade II diastolic dysfunction  (pseudonormalization). Elevated left atrial pressure.  2. Right ventricular systolic function is normal. The right ventricular  size is normal. There is normal pulmonary artery systolic pressure.  3. Left atrial size was mildly dilated.  4. The mitral valve is normal in structure. Mild mitral valve  regurgitation. No evidence  of mitral stenosis.  5. The aortic valve is normal in structure. Aortic valve regurgitation is  trivial. No aortic stenosis is present.  6. The inferior vena cava is normal in size  with greater than 50%  respiratory variability, suggesting right atrial pressure of 3 mmHg.   Recent Labs: 04/24/2020: B Natriuretic Peptide 374.9 04/25/2020: ALT 18; Magnesium 1.9; TSH 5.783 05/03/2020: Hemoglobin 10.0; Platelets 287 05/07/2020: BUN 36; Creatinine, Ser 1.87; Potassium 4.2; Sodium 135   Recent Lipid Panel    Component Value Date/Time   CHOL 117 03/24/2020 1428   TRIG 54 03/24/2020 1428   HDL 56 03/24/2020 1428   CHOLHDL 2 05/22/2019 1631   VLDL 23.8 05/22/2019 1631   LDLCALC 49 03/24/2020 1428    Physical Exam:   VS:  BP 125/83   Pulse 94   Temp (!) 94.6 F (34.8 C)   Ht '5\' 2"'$  (1.575 m)   Wt 227 lb 6.4 oz (103.1 kg)   SpO2 97%   BMI 41.59 kg/m    Wt Readings from Last 3 Encounters:  05/28/20 227 lb 6.4 oz (103.1 kg)  05/18/20 (!) 222 lb 8 oz (100.9 kg)  05/07/20 222 lb 8 oz (100.9 kg)    General: Well nourished, well developed, in no acute distress Heart: Atraumatic, normal size  Eyes: PEERLA, EOMI  Neck: Supple, +JVD Endocrine: No thryomegaly Cardiac: Normal S1, S2; irregular rhythm, no murmurs rubs or gallops Lungs: Clear to auscultation bilaterally, no wheezing, rhonchi or rales  Abd: Distended abdomen, tympanic to percussion Ext: Trace edema Musculoskeletal: No deformities, BUE and BLE strength normal and equal Skin: Warm and dry, no rashes   Neuro: Alert and oriented to person, place, time, and situation, CNII-XII grossly intact, no focal deficits  Psych: Normal mood and affect   ASSESSMENT:   Regina [name] is a 79 y.o. female who presents for the following: 1. Cardiac amyloidosis ([place])   2. Chronic diastolic heart failure (HCC)   3. Persistent atrial fibrillation ([place])   4. Atrial fibrillation with RVR ([place])   5. Essential hypertension     PLAN:   1.  Cardiac amyloidosis ([place]) 2. Chronic diastolic heart failure (HCC) -Recent diagnosis with diastolic heart failure.  PYP scan is visually positive for grade 3 involvement of cardiac amyloidosis.  I suspect she has ATTR amyloidosis.  I have ordered genetic testing through [facility].  Those are pending. -I did review the the transesophageal echocardiogram in which her ejection fraction was read as 35 to 40%.  Overall I feel her EF is normal.  She was in A. fib with RVR during the study.  She has known ATTR amyloidosis which is not associated with reduced EF but more associated with HFpEF.  Her blood pressures will not tolerate any ACE inhibitors or ARB.  Furthermore, she has significant CKD and I would be reluctant to start any other medications with this. -She presents with worsening volume overload and shortness of breath.  I suspect her abdominal symptoms are not angina but actually related to volume.  She also reports reduced appetite.  I think she is just not tolerating A. fib and has some volume on board.  We will go ahead and switch her to torsemide 40 mg daily.  Her volume status is a bit difficult to tell.  I will check a BNP for comparison to previous.  I suspect this will be significantly elevated. -She had a recent TEE with left atrial appendage sludge versus thrombus.  Cardioversion was canceled.  She is on Eliquis.  I think we will plan to try to get her back in normal rhythm.  I will set her up for a TEE/cardioversion on June 23, 2020 with  me. -I do want her to see Dr. Glori Bickers in the heart failure clinic.  She may be a good candidate for tafamidis.  -He did recommend an attempt at cardioversion as atrial fibrillation is poorly tolerated in patients with ATTR amyloidosis. -We will continue metoprolol tartrate 50 mg twice daily for rate control for now.  3. Persistent atrial fibrillation ([place]) 4. Atrial fibrillation with RVR ([place]) -Recent attempted TEE/cardioversion on 05/05/2020 was  canceled due to left atrial appendage thrombus versus sludge.  She is on Eliquis.  Not tolerating A. fib as she has ATTR amyloidosis.  We will plan for repeat attempted TEE/cardioversion on 06/23/2020.  I will see her back in about 10 days just to make sure she is doing better.  Suspect she has significant volume overload.  5. Essential hypertension -Continue metoprolol.  Would be very cautious with any BP medications moving forward.  Disposition: Return in about 2 weeks (around 06/11/2020).  Medication Adjustments/Labs and Tests Ordered: Current medicines are reviewed at length with the patient today.  Concerns regarding medicines are outlined above.  Orders Placed This Encounter  Procedures  . Basic metabolic panel  . Brain natriuretic peptide  . AMB referral to CHF clinic  . EKG 12-Lead   Meds ordered this encounter  Medications  . torsemide (DEMADEX) 20 MG tablet    Sig: Take 2 tablets (40 mg total) by mouth daily.    Dispense:  180 tablet    Refill:  3  . apixaban (ELIQUIS) 5 MG TABS tablet    Sig: Take 1 tablet (5 mg total) by mouth 2 (two) times daily.    Dispense:  60 tablet    Refill:  11  . metoprolol tartrate (LOPRESSOR) 100 MG tablet    Sig: Take 0.5 tablets (50 mg total) by mouth 2 (two) times daily.    Dispense:  60 tablet    Refill:  3    Patient Instructions  Medication Instructions:  Change to Torsemide 40 mg daily   *If you need a refill on your cardiac medications before your next appointment, please call your pharmacy*  LABS: BMET, BNP today    Testing/Procedures:  Your physician has requested that you have a TEE/Cardioversion. During a TEE, sound waves are used to create images of your heart. It provides your doctor with information about the size and shape of your heart and how well your heart's chambers and valves are working. In this test, a transducer is attached to the end of a flexible tube that is guided down you throat and into your esophagus (the  tube leading from your mouth to your stomach) to get a more detailed image of your heart. Once the TEE has determined that a blood clot is not present, the cardioversion begins. Electrical Cardioversion uses a jolt of electricity to your heart either through paddles or wired patches attached to your chest. This is a controlled, usually prescheduled, procedure. This procedure is done at the hospital and you are not awake during the procedure. You usually go home the day of the procedure. Please see the instruction sheet given to you today for more information.    Follow-Up: At [facility], you and your health needs are our priority.  As part of our continuing mission to provide you with exceptional heart care, we have created designated Provider Care Teams.  These Care Teams include your primary Cardiologist (physician) and Advanced Practice Providers (APPs -  Physician Assistants and Nurse Practitioners) who all work together to  provide you with the care you need, when you need it.  We recommend signing up for the patient portal called "MyChart".  Sign up information is provided on this After Visit Summary.  MyChart is used to connect with patients for Virtual Visits (Telemedicine).  Patients are able to view lab/test results, encounter notes, upcoming appointments, etc.  Non-urgent messages can be sent to your provider as well.   To learn more about what you can do with MyChart, go to [email or web address].    Your next appointment:   2 week(s) okay to double book August 25th.  The format for your next appointment:   In Person  Provider:   [name], MD   Other Instructions  Referral to [facility]- they will call you to get you set up with Dr.Bensimhon   You are scheduled for a TEE/Cardioversion/TEE Cardioversion on September 7th with Dr. Audie Box.  Please arrive at the [facility] (Main Entrance A) at [facility]: [place], [place] [address]  at 7:30 am/pm. (1 hour prior to procedure unless lab work is needed; if lab work is needed arrive 1.5 hours ahead)  DIET: Nothing to eat or drink after midnight except a sip of water with medications (see medication instructions below)  Medication Instructions:  Continue your anticoagulant: Eliquis You will need to continue your anticoagulant after your procedure until you  are told by your  Provider that it is safe to stop   Labs: we will discuss at next visit in 2 weeks with Dr.O'Neal.  You must have a responsible person to drive you home and stay in the waiting area during your procedure. Failure to do so could result in cancellation.  Bring your insurance cards.  *Special Note: Every effort is made to have your procedure done on time. Occasionally there are emergencies that occur at the hospital that may cause delays. Please be patient if a delay does occur.       Time Spent with Patient: I have spent a total of 35 minutes with patient reviewing hospital notes, telemetry, EKGs, labs and examining the patient as well as establishing an assessment and plan that was discussed with the patient.  > 50% of time was spent in direct patient care.  Signed, Addison Naegeli. Audie Box, [place]  [address], [place], [place] [address] [phone]  05/28/2020 12:43 PM

## 2020-05-28 ENCOUNTER — Encounter: Payer: Self-pay | Admitting: Cardiovascular Disease

## 2020-05-28 ENCOUNTER — Ambulatory Visit: Payer: Medicare Other | Admitting: Physician Assistant

## 2020-05-28 ENCOUNTER — Other Ambulatory Visit: Payer: Self-pay

## 2020-05-28 ENCOUNTER — Ambulatory Visit: Payer: Medicare Other | Admitting: Cardiovascular Disease

## 2020-05-28 VITALS — BP 125/83 | HR 94 | Temp 94.6°F | Ht 62.0 in | Wt 227.4 lb

## 2020-05-28 DIAGNOSIS — E854 Organ-limited amyloidosis: Secondary | ICD-10-CM

## 2020-05-28 DIAGNOSIS — I4819 Other persistent atrial fibrillation: Secondary | ICD-10-CM | POA: Diagnosis not present

## 2020-05-28 DIAGNOSIS — I5032 Chronic diastolic (congestive) heart failure: Secondary | ICD-10-CM | POA: Diagnosis not present

## 2020-05-28 DIAGNOSIS — I43 Cardiomyopathy in diseases classified elsewhere: Secondary | ICD-10-CM

## 2020-05-28 DIAGNOSIS — I10 Essential (primary) hypertension: Secondary | ICD-10-CM | POA: Diagnosis not present

## 2020-05-28 DIAGNOSIS — I4891 Unspecified atrial fibrillation: Secondary | ICD-10-CM

## 2020-05-28 MED ORDER — APIXABAN 5 MG PO TABS: 5.0000 mg | ORAL_TABLET | Freq: Two times a day (BID) | ORAL | 11 refills | Status: DC

## 2020-05-28 MED ORDER — TORSEMIDE 20 MG PO TABS
40.0000 mg | ORAL_TABLET | Freq: Every day | ORAL | 3 refills | Status: DC
Start: 2020-05-28 — End: 2020-07-10

## 2020-05-28 MED ORDER — METOPROLOL TARTRATE 100 MG PO TABS: 50.0000 mg | ORAL_TABLET | Freq: Two times a day (BID) | ORAL | 3 refills | Status: DC

## 2020-05-28 NOTE — Patient Instructions (Addendum)
Medication Instructions:  Change to Torsemide 40 mg daily   *If you need a refill on your cardiac medications before your next appointment, please call your pharmacy*  LABS: BMET, BNP today    Testing/Procedures:  Your physician has requested that you have a TEE/Cardioversion. During a TEE, sound waves are used to create images of your heart. It provides your doctor with information about the size and shape of your heart and how well your heart's chambers and valves are working. In this test, a transducer is attached to the end of a flexible tube that is guided down you throat and into your esophagus (the tube leading from your mouth to your stomach) to get a more detailed image of your heart. Once the TEE has determined that a blood clot is not present, the cardioversion begins. Electrical Cardioversion uses a jolt of electricity to your heart either through paddles or wired patches attached to your chest. This is a controlled, usually prescheduled, procedure. This procedure is done at the hospital and you are not awake during the procedure. You usually go home the day of the procedure. Please see the instruction sheet given to you today for more information.    Follow-Up: At [facility], you and your health needs are our priority.  As part of our continuing mission to provide you with exceptional heart care, we have created designated Provider Care Teams.  These Care Teams include your primary Cardiologist (physician) and Advanced Practice Providers (APPs -  Physician Assistants and Nurse Practitioners) who all work together to provide you with the care you need, when you need it.  We recommend signing up for the patient portal called "MyChart".  Sign up information is provided on this After Visit Summary.  MyChart is used to connect with patients for Virtual Visits (Telemedicine).  Patients are able to view lab/test results, encounter notes, upcoming appointments, etc.  Non-urgent messages can  be sent to your provider as well.   To learn more about what you can do with MyChart, go to [email or web address].    Your next appointment:   2 week(s) okay to double book August 25th.  The format for your next appointment:   In Person  Provider:   [name], MD   Other Instructions  Referral to [facility]- they will call you to get you set up with Dr.Bensimhon   You are scheduled for a TEE/Cardioversion/TEE Cardioversion on September 7th with Dr. Audie Box.  Please arrive at the [facility] (Main Entrance A) at [facility]: [place], [place] [address] at 7:30 am/pm. (1 hour prior to procedure unless lab work is needed; if lab work is needed arrive 1.5 hours ahead)  DIET: Nothing to eat or drink after midnight except a sip of water with medications (see medication instructions below)  Medication Instructions:  Continue your anticoagulant: Eliquis You will need to continue your anticoagulant after your procedure until you  are told by your  Provider that it is safe to stop   Labs: we will discuss at next visit in 2 weeks with Dr.O'Neal.  You must have a responsible person to drive you home and stay in the waiting area during your procedure. Failure to do so could result in cancellation.  Bring your insurance cards.  *Special Note: Every effort is made to have your procedure done on time. Occasionally there are emergencies that occur at the hospital that may cause delays. Please be patient if a delay does occur.

## 2020-05-29 LAB — BASIC METABOLIC PANEL
BUN/Creatinine Ratio: 24 (ref 12–28)
BUN: 43 mg/dL — ABNORMAL HIGH (ref 8–27)
CO2: 24 mmol/L (ref 20–29)
Calcium: 9.6 mg/dL (ref 8.7–10.3)
Chloride: 98 mmol/L (ref 96–106)
Creatinine, Ser: 1.81 mg/dL — ABNORMAL HIGH (ref 0.57–1.00)
GFR calc Af Amer: 31 mL/min/{1.73_m2} — ABNORMAL LOW (ref >59)
GFR calc non Af Amer: 27 mL/min/{1.73_m2} — ABNORMAL LOW (ref >59)
Glucose: 168 mg/dL — ABNORMAL HIGH (ref 65–99)
Potassium: 4.9 mmol/L (ref 3.5–5.2)
Sodium: 137 mmol/L (ref 134–144)

## 2020-05-29 LAB — BRAIN NATRIURETIC PEPTIDE: BNP: 630.4 pg/mL — ABNORMAL HIGH (ref 0.0–100.0)

## 2020-06-08 ENCOUNTER — Ambulatory Visit (INDEPENDENT_AMBULATORY_CARE_PROVIDER_SITE_OTHER): Payer: Medicare Other | Admitting: Family Medicine

## 2020-06-08 ENCOUNTER — Other Ambulatory Visit: Payer: Self-pay

## 2020-06-08 ENCOUNTER — Encounter: Payer: Self-pay | Admitting: Family Medicine

## 2020-06-08 VITALS — BP 120/72 | HR 87 | Temp 98.3°F | Resp 16 | Ht 62.0 in | Wt 217.0 lb

## 2020-06-08 DIAGNOSIS — W19XXXA Unspecified fall, initial encounter: Secondary | ICD-10-CM | POA: Diagnosis not present

## 2020-06-08 DIAGNOSIS — I10 Essential (primary) hypertension: Secondary | ICD-10-CM | POA: Diagnosis not present

## 2020-06-08 DIAGNOSIS — K219 Gastro-esophageal reflux disease without esophagitis: Secondary | ICD-10-CM | POA: Diagnosis not present

## 2020-06-08 DIAGNOSIS — Y92009 Unspecified place in unspecified non-institutional (private) residence as the place of occurrence of the external cause: Secondary | ICD-10-CM

## 2020-06-08 DIAGNOSIS — L989 Disorder of the skin and subcutaneous tissue, unspecified: Secondary | ICD-10-CM

## 2020-06-08 DIAGNOSIS — E039 Hypothyroidism, unspecified: Secondary | ICD-10-CM

## 2020-06-08 LAB — TSH: TSH: 3.24 mIU/L (ref 0.40–4.50)

## 2020-06-08 MED ORDER — FAMOTIDINE 20 MG PO TABS: 20.0000 mg | ORAL_TABLET | Freq: Every day | ORAL | 1 refills | Status: DC

## 2020-06-08 NOTE — Assessment & Plan Note (Signed)
BP adequately controlled. Continue monitoring BP and low salt diet. No changes in current management.

## 2020-06-08 NOTE — Progress Notes (Signed)
HPI: Regina [name] is a 79 y.o. female, who is here today to follow on recent OV/ER.   She was last seen on 05/18/2020. Hypertension: Last visit Terazosin 5 mg was discontinued.  Currently she is on metoprolol 100 mg 1/2 tablet twice daily. She is taking BP's at home and similar to reading today. Negative for unusual/severe headache,visual changes,CP,SOB,or worsening edema.  She is no longer on furosemide 40 mg daily but on Torsemide 20 mg bid. Negative for orthopnea and PND.  Lab Results  Component Value Date   CREATININE 1.81 (H) 05/28/2020   BUN 43 (H) 05/28/2020   NA 137 05/28/2020   K 4.9 05/28/2020   CL 98 05/28/2020   CO2 24 05/28/2020    Feels "something in my chest, it is not pain", this is going on for a while, feels the needs to burp,mild heartburn.  She is taking Protonix 40. She has not identified exacerbating or alleviating factors. It is not caused by exertion.  Last week she fell at home. She was going down stairs holding things in both hands. She missed last step, landed on right side . No head trauma, she was able to get up with no help. She did not have dizziness or lightheadedness before or after fall.  -Scalp lesion noted a while ago, sometimes it feels bigger. There is no pain, not sure about growth.  Hypothyroidism: She is on Levothyroxine 150 mcg daily.  Lab Results  Component Value Date   TSH 5.783 (H) 04/25/2020   Review of Systems  Constitutional: Negative for activity change, appetite change, fatigue and fever.  HENT: Negative for mouth sores, nosebleeds and sore throat.   Respiratory: Negative for cough and wheezing.   Cardiovascular: Positive for palpitations.  Gastrointestinal: Negative for abdominal pain and vomiting.       Negative for changes in bowel habits.  Genitourinary: Negative for decreased urine volume, dysuria and hematuria.  Neurological: Negative for syncope, facial asymmetry and weakness.  Rest see pertinent  positives and negatives per HPI.  Current Outpatient Medications on File Prior to Visit  Medication Sig Dispense Refill  . Alcohol Swabs (B-D SINGLE USE SWABS REGULAR) PADS Use to test blood sugar up to 3 times daily 100 each 3  . allopurinol (ZYLOPRIM) 100 MG tablet Take 1 tablet (100 mg total) by mouth daily. 30 tablet 3  . apixaban (ELIQUIS) 5 MG TABS tablet Take 1 tablet (5 mg total) by mouth 2 (two) times daily. 60 tablet 11  . Ascorbic Acid (VITA-C PO) Take 1 tablet by mouth daily.     Marland Kitchen aspirin EC 81 MG tablet Take 81 mg by mouth daily. Swallow whole.    Marland Kitchen atorvastatin (LIPITOR) 20 MG tablet Take 1 tablet (20 mg total) by mouth daily. (Patient taking differently: Take 20 mg by mouth at bedtime. ) 90 tablet 2  . Blood Glucose Monitoring Suppl (ACCU-CHEK GUIDE) w/Device KIT 1 Device by Does not apply route 3 (three) times daily. 1 kit 0  . Cholecalciferol (VITAMIN D3) 50 MCG (2000 UT) capsule Take 2,000 Units by mouth daily.    . clotrimazole-betamethasone (LOTRISONE) cream Apply 1 application topically 2 (two) times daily as needed. (Patient taking differently: Apply 1 application topically 2 (two) times daily as needed (rash). ) 30 g 1  . cyclobenzaprine (FLEXERIL) 5 MG tablet TAKE 1 TABLET BY MOUTH ONCE DAILY AS NEEDED 30 tablet 0  . diclofenac Sodium (VOLTAREN) 1 % GEL Apply 1 application topically daily as  needed (arthritis pain/hands).    . fluticasone (FLONASE) 50 MCG/ACT nasal spray Place 1 spray into both nostrils daily as needed for allergies or rhinitis.    Marland Kitchen gabapentin (NEURONTIN) 600 MG tablet Take 1 tablet by mouth twice daily 60 tablet 0  . glucose blood (ACCU-CHEK GUIDE) test strip Use to test blood sugar up to 3 times daily 100 each 3  . insulin glargine, 1 Unit Dial, (TOUJEO SOLOSTAR) 300 UNIT/ML Solostar Pen Inject 25 Units into the skin daily. Eat a snack with protein nightly before bedtime. 9 mL 0  . Iron-Vitamins (GERITOL COMPLETE PO) Take 1 tablet by mouth daily.    Marland Kitchen  lactulose (CHRONULAC) 10 GM/15ML solution Take 20 g by mouth daily as needed for mild constipation.     . Lancets Misc. (ACCU-CHEK FASTCLIX LANCET) KIT Use to test blood sugar up to 3 times daily 1 kit 1  . levothyroxine (SYNTHROID) 150 MCG tablet Take 150 mcg by mouth daily before breakfast.     . loratadine (CLARITIN) 10 MG tablet Take 1 tablet (10 mg total) by mouth daily as needed. (Patient taking differently: Take 10 mg by mouth daily as needed for allergies. ) 90 tablet 0  . magnesium oxide (MAG-OX) 400 (241.3 Mg) MG tablet Take 1 tablet by mouth once daily 90 tablet 0  . meclizine (ANTIVERT) 25 MG tablet Take 25 mg by mouth daily as needed for dizziness.     . metoprolol tartrate (LOPRESSOR) 100 MG tablet Take 0.5 tablets (50 mg total) by mouth 2 (two) times daily. 60 tablet 3  . nitroGLYCERIN (NITROSTAT) 0.4 MG SL tablet Place 1 tablet (0.4 mg total) under the tongue every 5 (five) minutes as needed for chest pain. 25 tablet 2  . Omega-3 Fatty Acids (FISH OIL) 1000 MG CAPS Take 1,000 mg by mouth daily.     Marland Kitchen OZEMPIC, 1 MG/DOSE, 2 MG/1.5ML SOPN Inject 0.75 mLs (1 mg total) into the skin once a week. (Patient taking differently: Inject 1 mg into the skin every Wednesday. ) 3 mL 0  . pantoprazole (PROTONIX) 40 MG tablet Take 1 tablet (40 mg total) by mouth daily. Take 30 minutes before eating in the morning with 8oz water 30 tablet 11  . polyethylene glycol (MIRALAX / GLYCOLAX) 17 g packet Take 17 g by mouth daily as needed for mild constipation. 14 each 0  . Propylene Glycol (SYSTANE BALANCE OP) Place 1 drop into both eyes daily as needed (for dry eyes).    . torsemide (DEMADEX) 20 MG tablet Take 2 tablets (40 mg total) by mouth daily. 180 tablet 3   No current facility-administered medications on file prior to visit.    Past Medical History:  Diagnosis Date  . (HFpEF) heart failure with preserved ejection fraction ([place])   . Back pain   . Diabetes mellitus without complication ([place])   .  Dyspnea   . GERD (gastroesophageal reflux disease)   . H/O echocardiogram    a. 01/2015 Echo: EF 55-60%, Gr 2 DD, mod LVH, mildly dil LA.  Marland Kitchen Heart disease   . Hypertension   . Hypothyroidism   . Joint pain   . Kidney problem   . Lower extremity edema   . Mini stroke ([place])   . Non-obstructive CAD    a. 01/2015 Cardiolite: + inf wall ischemia, EF 56%;  b. 01/2015 Cath: LM nl, LAD 50m LCX min irregs, RCA dominant, 50-643m . Sleep apnea   . Swallowing difficulty   .  Thyroid disease    Allergies  Allergen Reactions  . Ace Inhibitors Other (See Comments)    unknown  . Amlodipine Other (See Comments)    unknown  . Atenolol Other (See Comments)    bradycardia  . Avandia [Rosiglitazone] Other (See Comments)    unknown  . Darvon [Propoxyphene] Other (See Comments)    unknown  . Erythromycin Itching  . Hydralazine Other (See Comments)    Burning in throat and chest  . Hydrocodone Other (See Comments)    No reaction per patient, but ineffective  . Levofloxacin Itching  . Morphine And Related Other (See Comments)    Dizzy and hallucianation, vomiting; Willing to try low dose  . Percocet [Oxycodone-Acetaminophen] Other (See Comments)    hallucination  . Spironolactone Other (See Comments)    unknown  . Tramadol Other (See Comments)    Unknown/does not recall reaction but does not want to take again    Social History   Socioeconomic History  . Marital status: Widowed    Spouse name: Not on file  . Number of children: Not on file  . Years of education: Not on file  . Highest education level: Not on file  Occupational History  . Occupation: Retired  Tobacco Use  . Smoking status: Never Smoker  . Smokeless tobacco: Never Used  Vaping Use  . Vaping Use: Never used  Substance and Sexual Activity  . Alcohol use: No  . Drug use: Not on file  . Sexual activity: Not Currently  Other Topics Concern  . Not on file  Social History Narrative   Lives with daughter   Social  Determinants of Health   Financial Resource Strain:   . Difficulty of Paying Living Expenses: Not on file  Food Insecurity:   . Worried About Charity fundraiser in the Last Year: Not on file  . Ran Out of Food in the Last Year: Not on file  Transportation Needs:   . Lack of Transportation (Medical): Not on file  . Lack of Transportation (Non-Medical): Not on file  Physical Activity:   . Days of Exercise per Week: Not on file  . Minutes of Exercise per Session: Not on file  Stress:   . Feeling of Stress : Not on file  Social Connections:   . Frequency of Communication with Friends and Family: Not on file  . Frequency of Social Gatherings with Friends and Family: Not on file  . Attends Religious Services: Not on file  . Active Member of Clubs or Organizations: Not on file  . Attends Archivist Meetings: Not on file  . Marital Status: Not on file    Vitals:   06/08/20 0806  BP: 120/72  Pulse: 87  Resp: 16  Temp: 98.3 F (36.8 C)  SpO2: 100%   Body mass index is 39.69 kg/m.   Physical Exam Vitals and nursing note reviewed.  Constitutional:      General: She is not in acute distress.    Appearance: She is well-developed.  HENT:     Head: Normocephalic and atraumatic.      Comments: 1.2 nodular, firm,mobile lesion under scalp left parietal temporal.    Mouth/Throat:     Mouth: Mucous membranes are moist.     Pharynx: Oropharynx is clear.  Eyes:     Conjunctiva/sclera: Conjunctivae normal.     Pupils: Pupils are equal, round, and reactive to light.  Cardiovascular:     Rate and Rhythm: Normal rate. Rhythm  regularly irregular.     Pulses:          Dorsalis pedis pulses are 2+ on the right side and 2+ on the left side.     Heart sounds: No murmur heard.   Pulmonary:     Effort: Pulmonary effort is normal. No respiratory distress.     Breath sounds: Normal breath sounds.  Abdominal:     Palpations: Abdomen is soft. There is no hepatomegaly or mass.      Tenderness: There is no abdominal tenderness.  Musculoskeletal:     Right lower leg: 1+ Pitting Edema present.     Left lower leg: 1+ Pitting Edema present.  Lymphadenopathy:     Cervical: No cervical adenopathy.  Skin:    General: Skin is warm.     Findings: No erythema or rash.  Neurological:     Mental Status: She is alert and oriented to person, place, and time.     Cranial Nerves: No cranial nerve deficit.     Gait: Gait normal.  Psychiatric:        Mood and Affect: Mood and affect normal.     Comments: Well groomed, good eye contact.   ASSESSMENT AND PLAN:  Regina [name] was seen today for follow-up.  Diagnoses and all orders for this visit:  Orders Placed This Encounter  Procedures  . TSH    Fall in home, initial encounter Fall precautions discussed. At this time I do not think PT is necessary.  Hypothyroidism No changes in current dose of Levothyroxine, dose will be adjusted, if needed,according to TSH.  GERD (gastroesophageal reflux disease) Still symptomatic. Pepcid 20 mg added today. Continue Protonix 40 mg daily. GERD precautions also recommended.  Essential hypertension BP adequately controlled. Continue monitoring BP and low salt diet. No changes in current management.  Lesion of skin of scalp Benign scalp lesions most likely. ? Lipoma,sebaceous cyst. Continue monitoring for changes.  Return in about 4 months (around 10/15/2020) for DM II,HTN.   Sharicka Pogorzelski G. [name], MD  [facility]. [place] office.    things to remember from today's visit:  Today Pepcid was added to take at night for heartburn. Rest no changes.  If you need refills please call your pharmacy. Do not use My Chart to request refills or for acute issues that need immediate attention.    Fall Prevention in the Home, Adult Falls can cause injuries. They can happen to people of all ages. There are many things you can do to make your home safe and to help prevent falls. Ask  for help when making these changes, if needed. What actions can I take to prevent falls? General Instructions  Use good lighting in all rooms. Replace any light bulbs that burn out.  Turn on the lights when you go into a dark area. Use night-lights.  Keep items that you use often in easy-to-reach places. Lower the shelves around your home if necessary.  Set up your furniture so you have a clear path. Avoid moving your furniture around.  Do not have throw rugs and other things on the floor that can make you trip.  Avoid walking on wet floors.  If any of your floors are uneven, fix them.  Add color or contrast paint or tape to clearly mark and help you see: ? Any grab bars or handrails. ? First and last steps of stairways. ? Where the edge of each step is.  If you use a stepladder: ? Make sure that it  is fully opened. Do not climb a closed stepladder. ? Make sure that both sides of the stepladder are locked into place. ? Ask someone to hold the stepladder for you while you use it.  If there are any pets around you, be aware of where they are. What can I do in the bathroom?      Keep the floor dry. Clean up any water that spills onto the floor as soon as it happens.  Remove soap buildup in the tub or shower regularly.  Use non-skid mats or decals on the floor of the tub or shower.  Attach bath mats securely with double-sided, non-slip rug tape.  If you need to sit down in the shower, use a plastic, non-slip stool.  Install grab bars by the toilet and in the tub and shower. Do not use towel bars as grab bars. What can I do in the bedroom?  Make sure that you have a light by your bed that is easy to reach.  Do not use any sheets or blankets that are too big for your bed. They should not hang down onto the floor.  Have a firm chair that has side arms. You can use this for support while you get dressed. What can I do in the kitchen?  Clean up any spills right  away.  If you need to reach something above you, use a strong step stool that has a grab bar.  Keep electrical cords out of the way.  Do not use floor polish or wax that makes floors slippery. If you must use wax, use non-skid floor wax. What can I do with my stairs?  Do not leave any items on the stairs.  Make sure that you have a light switch at the top of the stairs and the bottom of the stairs. If you do not have them, ask someone to add them for you.  Make sure that there are handrails on both sides of the stairs, and use them. Fix handrails that are broken or loose. Make sure that handrails are as long as the stairways.  Install non-slip stair treads on all stairs in your home.  Avoid having throw rugs at the top or bottom of the stairs. If you do have throw rugs, attach them to the floor with carpet tape.  Choose a carpet that does not hide the edge of the steps on the stairway.  Check any carpeting to make sure that it is firmly attached to the stairs. Fix any carpet that is loose or worn. What can I do on the outside of my home?  Use bright outdoor lighting.  Regularly fix the edges of walkways and driveways and fix any cracks.  Remove anything that might make you trip as you walk through a door, such as a raised step or threshold.  Trim any bushes or trees on the path to your home.  Regularly check to see if handrails are loose or broken. Make sure that both sides of any steps have handrails.  Install guardrails along the edges of any raised decks and porches.  Clear walking paths of anything that might make someone trip, such as tools or rocks.  Have any leaves, snow, or ice cleared regularly.  Use sand or salt on walking paths during winter.  Clean up any spills in your garage right away. This includes grease or oil spills. What other actions can I take?  Wear shoes that: ? Have a low heel. Do not wear  high heels. ? Have rubber bottoms. ? Are comfortable  and fit you well. ? Are closed at the toe. Do not wear open-toe sandals.  Use tools that help you move around (mobility aids) if they are needed. These include: ? Canes. ? Walkers. ? Scooters. ? Crutches.  Review your medicines with your doctor. Some medicines can make you feel dizzy. This can increase your chance of falling. Ask your doctor what other things you can do to help prevent falls. Where to find more information  Centers for Disease Control and Prevention, STEADI: https://garcia.biz/  [employer] on Aging: BrainJudge.co.uk Contact a doctor if:  You are afraid of falling at home.  You feel weak, drowsy, or dizzy at home.  You fall at home. Summary  There are many simple things that you can do to make your home safe and to help prevent falls.  Ways to make your home safe include removing tripping hazards and installing grab bars in the bathroom.  Ask for help when making these changes in your home. This information is not intended to replace advice given to you by your health care provider. Make sure you discuss any questions you have with your health care provider. Document Revised: 01/24/2019 Document Reviewed: 05/18/2017 Elsevier Patient Education  [place]. Please be sure medication list is accurate. If a new problem present, please set up appointment sooner than planned today.

## 2020-06-08 NOTE — Assessment & Plan Note (Signed)
Still symptomatic. Pepcid 20 mg added today. Continue Protonix 40 mg daily. GERD precautions also recommended.

## 2020-06-08 NOTE — Assessment & Plan Note (Signed)
No changes in current dose of Levothyroxine, dose will be adjusted, if needed,according to TSH.

## 2020-06-08 NOTE — Patient Instructions (Addendum)
A few things to remember from today's visit:   Hypothyroidism, unspecified type - Plan: TSH  Hypertension associated with diabetes ([place])  Gastroesophageal reflux disease, unspecified whether esophagitis present  Today Pepcid was added to take at night for heartburn. Rest no changes.  If you need refills please call your pharmacy. Do not use My Chart to request refills or for acute issues that need immediate attention.    Fall Prevention in the Home, Adult Falls can cause injuries. They can happen to people of all ages. There are many things you can do to make your home safe and to help prevent falls. Ask for help when making these changes, if needed. What actions can I take to prevent falls? General Instructions  Use good lighting in all rooms. Replace any light bulbs that burn out.  Turn on the lights when you go into a dark area. Use night-lights.  Keep items that you use often in easy-to-reach places. Lower the shelves around your home if necessary.  Set up your furniture so you have a clear path. Avoid moving your furniture around.  Do not have throw rugs and other things on the floor that can make you trip.  Avoid walking on wet floors.  If any of your floors are uneven, fix them.  Add color or contrast paint or tape to clearly mark and help you see: ? Any grab bars or handrails. ? First and last steps of stairways. ? Where the edge of each step is.  If you use a stepladder: ? Make sure that it is fully opened. Do not climb a closed stepladder. ? Make sure that both sides of the stepladder are locked into place. ? Ask someone to hold the stepladder for you while you use it.  If there are any pets around you, be aware of where they are. What can I do in the bathroom?      Keep the floor dry. Clean up any water that spills onto the floor as soon as it happens.  Remove soap buildup in the tub or shower regularly.  Use non-skid mats or decals on the floor of  the tub or shower.  Attach bath mats securely with double-sided, non-slip rug tape.  If you need to sit down in the shower, use a plastic, non-slip stool.  Install grab bars by the toilet and in the tub and shower. Do not use towel bars as grab bars. What can I do in the bedroom?  Make sure that you have a light by your bed that is easy to reach.  Do not use any sheets or blankets that are too big for your bed. They should not hang down onto the floor.  Have a firm chair that has side arms. You can use this for support while you get dressed. What can I do in the kitchen?  Clean up any spills right away.  If you need to reach something above you, use a strong step stool that has a grab bar.  Keep electrical cords out of the way.  Do not use floor polish or wax that makes floors slippery. If you must use wax, use non-skid floor wax. What can I do with my stairs?  Do not leave any items on the stairs.  Make sure that you have a light switch at the top of the stairs and the bottom of the stairs. If you do not have them, ask someone to add them for you.  Make sure that there  are handrails on both sides of the stairs, and use them. Fix handrails that are broken or loose. Make sure that handrails are as long as the stairways.  Install non-slip stair treads on all stairs in your home.  Avoid having throw rugs at the top or bottom of the stairs. If you do have throw rugs, attach them to the floor with carpet tape.  Choose a carpet that does not hide the edge of the steps on the stairway.  Check any carpeting to make sure that it is firmly attached to the stairs. Fix any carpet that is loose or worn. What can I do on the outside of my home?  Use bright outdoor lighting.  Regularly fix the edges of walkways and driveways and fix any cracks.  Remove anything that might make you trip as you walk through a door, such as a raised step or threshold.  Trim any bushes or trees on the path  to your home.  Regularly check to see if handrails are loose or broken. Make sure that both sides of any steps have handrails.  Install guardrails along the edges of any raised decks and porches.  Clear walking paths of anything that might make someone trip, such as tools or rocks.  Have any leaves, snow, or ice cleared regularly.  Use sand or salt on walking paths during winter.  Clean up any spills in your garage right away. This includes grease or oil spills. What other actions can I take?  Wear shoes that: ? Have a low heel. Do not wear high heels. ? Have rubber bottoms. ? Are comfortable and fit you well. ? Are closed at the toe. Do not wear open-toe sandals.  Use tools that help you move around (mobility aids) if they are needed. These include: ? Canes. ? Walkers. ? Scooters. ? Crutches.  Review your medicines with your doctor. Some medicines can make you feel dizzy. This can increase your chance of falling. Ask your doctor what other things you can do to help prevent falls. Where to find more information  Centers for Disease Control and Prevention, STEADI: https://garcia.biz/  [employer] on Aging: BrainJudge.co.uk Contact a doctor if:  You are afraid of falling at home.  You feel weak, drowsy, or dizzy at home.  You fall at home. Summary  There are many simple things that you can do to make your home safe and to help prevent falls.  Ways to make your home safe include removing tripping hazards and installing grab bars in the bathroom.  Ask for help when making these changes in your home. This information is not intended to replace advice given to you by your health care provider. Make sure you discuss any questions you have with your health care provider. Document Revised: 01/24/2019 Document Reviewed: 05/18/2017 Elsevier Patient Education  [place]. Please be sure medication list is accurate. If a new problem present, please set up  appointment sooner than planned today.

## 2020-06-08 NOTE — Progress Notes (Signed)
Cardiology Office Note:   Date:  06/10/2020  NAME:  Regina [name]    MRN: [id number] DOB:  May 30, 1941   PCP:  [name], [name], MD  Cardiologist:  Evalina Field, MD  Electrophysiologist:  None   Referring MD: [name], [name], MD   Chief Complaint  Patient presents with  . Shortness of Breath    History of Present Illness:   Regina [name] is a 79 y.o. female with a hx of hereditary cardiac amyloidosis, persistent atrial fibrillation, diabetes, hypertension, hyperlipidemia, nonobstructive CAD, OSA who presents for follow-up.  Recently admitted for A. fib with RVR.  Had left atrial appendage thrombus.  Plan for 3 weeks of anticoagulation prior to reattempt at cardioversion.  Recent genetic testing is positive for hereditary ATTR amyloid.  We also switch to torsemide due to increased volume.  She is lost 10 pounds on torsemide.  Appears to be doing fairly well.  Her blood pressure is 90/66.  She still has an irregular rhythm of atrial fibrillation.  She reports that her shortness of breath has improved.  She is still washing the salt intake as well as trying to modify her diet.  She denies any further chest pain.  We did go over the results of her genetic testing which demonstrates she has hereditary ATTR cardiac amyloidosis.  She presents with her son today.  We did talk about her prognosis and overall likely progression of disease.  I do feel she is fairly advanced in her disease state.  We discussed starting tafamidis as well as getting over to the [facility] cardiac amyloid clinic.  We also discussed that she will have genetic counseling provided through [facility].  I think her and her children should sit through the session and then they can decide how they would like to proceed with testing.  Clearly this will have a lasting impact for the children.  I also think an evaluation at [facility] will largely help that to determine if there is any clinical trial she can participate in as well as anything  for her children.  She is still taking her Eliquis and not missing any doses.  We still have plans to proceed with TEE/cardioversion on September 7.  We likely will start amiodarone at that visit.  We did discuss this in the office.  Problem List 1.  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 Persistent Afib/LAA slugde vs thrombus 05/05/2020  3. DM -A1c 7.6 4. HLD -T chol 98, HDL 44, LDL 30, TG 119 5. HTN 6. Non-obstructive CAD -LHC 01/19/2015 [facility] [place] -50% mid RCA and 50% LAD 7. OSA 8. CKD 3  Past Medical History: Past Medical History:  Diagnosis Date  . (HFpEF) heart failure with preserved ejection fraction ([place])   . Back pain   . Diabetes mellitus without complication ([place])   . Dyspnea   . GERD (gastroesophageal reflux disease)   . H/O echocardiogram    a. 01/2015 Echo: EF 55-60%, Gr 2 DD, mod LVH, mildly dil LA.  Marland Kitchen Heart disease   . Hypertension   . Hypothyroidism   . Joint pain   . Kidney problem   . Lower extremity edema   . Mini stroke ([place])   . Non-obstructive CAD    a. 01/2015 Cardiolite: + inf wall ischemia, EF 56%;  b. 01/2015 Cath: LM nl, LAD 50m LCX min irregs, RCA dominant, 50-625m .Marland Kitchen  Sleep apnea   . Swallowing difficulty   . Thyroid disease     Past Surgical History: Past Surgical History:  Procedure Laterality Date  . ABDOMINAL HYSTERECTOMY    . CARDIAC CATHETERIZATION    . ESOPHAGOGASTRODUODENOSCOPY     a. 01/2015 EGD: patent esophagus.  . ESOPHAGOGASTRODUODENOSCOPY N/A 01/20/2015   Procedure: ESOPHAGOGASTRODUODENOSCOPY (EGD);  Surgeon: Carol Ada, MD;  Location: [facility] ENDOSCOPY;  Service: Endoscopy;  Laterality: N/A;  . HAND SURGERY    . JOINT REPLACEMENT     reports history bilateral TKA and right TSA  . LEFT HEART CATHETERIZATION WITH CORONARY ANGIOGRAM N/A 01/19/2015  . TEE WITHOUT CARDIOVERSION  05/04/2020  . TEE WITHOUT CARDIOVERSION  N/A 05/05/2020   Procedure: TRANSESOPHAGEAL ECHOCARDIOGRAM (TEE);  Surgeon: Pixie Casino, MD;  Location: [facility] ENDOSCOPY;  Service: Cardiovascular;  Laterality: N/A;  . THYROIDECTOMY      Current Medications: Current Meds  Medication Sig  . Alcohol Swabs (B-D SINGLE USE SWABS REGULAR) PADS Use to test blood sugar up to 3 times daily  . allopurinol (ZYLOPRIM) 100 MG tablet Take 1 tablet (100 mg total) by mouth daily.  Marland Kitchen apixaban (ELIQUIS) 5 MG TABS tablet Take 1 tablet (5 mg total) by mouth 2 (two) times daily.  . Ascorbic Acid (VITA-C PO) Take 1 tablet by mouth daily.   Marland Kitchen aspirin EC 81 MG tablet Take 81 mg by mouth daily. Swallow whole.  Marland Kitchen atorvastatin (LIPITOR) 20 MG tablet Take 1 tablet (20 mg total) by mouth daily. (Patient taking differently: Take 20 mg by mouth at bedtime. )  . Blood Glucose Monitoring Suppl (ACCU-CHEK GUIDE) w/Device KIT 1 Device by Does not apply route 3 (three) times daily.  . Cholecalciferol (VITAMIN D3) 50 MCG (2000 UT) capsule Take 2,000 Units by mouth daily.  . clotrimazole-betamethasone (LOTRISONE) cream Apply 1 application topically 2 (two) times daily as needed. (Patient taking differently: Apply 1 application topically 2 (two) times daily as needed (rash). )  . cyclobenzaprine (FLEXERIL) 5 MG tablet TAKE 1 TABLET BY MOUTH ONCE DAILY AS NEEDED  . diclofenac Sodium (VOLTAREN) 1 % GEL Apply 1 application topically daily as needed (arthritis pain/hands).  . famotidine (PEPCID) 20 MG tablet Take 1 tablet (20 mg total) by mouth at bedtime.  . fluticasone (FLONASE) 50 MCG/ACT nasal spray Place 1 spray into both nostrils daily as needed for allergies or rhinitis.  Marland Kitchen gabapentin (NEURONTIN) 600 MG tablet Take 1 tablet by mouth twice daily  . glucose blood (ACCU-CHEK GUIDE) test strip Use to test blood sugar up to 3 times daily  . insulin glargine, 1 Unit Dial, (TOUJEO SOLOSTAR) 300 UNIT/ML Solostar Pen Inject 25 Units into the skin daily. Eat a snack with protein nightly  before bedtime.  . Iron-Vitamins (GERITOL COMPLETE PO) Take 1 tablet by mouth daily.  Marland Kitchen lactulose (CHRONULAC) 10 GM/15ML solution Take 20 g by mouth daily as needed for mild constipation.   . Lancets Misc. (ACCU-CHEK FASTCLIX LANCET) KIT Use to test blood sugar up to 3 times daily  . levothyroxine (SYNTHROID) 150 MCG tablet Take 150 mcg by mouth daily before breakfast.   . loratadine (CLARITIN) 10 MG tablet Take 1 tablet (10 mg total) by mouth daily as needed. (Patient taking differently: Take 10 mg by mouth daily as needed for allergies. )  . magnesium oxide (MAG-OX) 400 (241.3 Mg) MG tablet Take 1 tablet by mouth once daily  . meclizine (ANTIVERT) 25 MG tablet Take 25 mg by mouth daily as needed for dizziness.   Marland Kitchen  metoprolol tartrate (LOPRESSOR) 100 MG tablet Take 0.5 tablets (50 mg total) by mouth 2 (two) times daily.  . nitroGLYCERIN (NITROSTAT) 0.4 MG SL tablet Place 1 tablet (0.4 mg total) under the tongue every 5 (five) minutes as needed for chest pain.  . Omega-3 Fatty Acids (FISH OIL) 1000 MG CAPS Take 1,000 mg by mouth daily.   Marland Kitchen OZEMPIC, 1 MG/DOSE, 2 MG/1.5ML SOPN Inject 0.75 mLs (1 mg total) into the skin once a week. (Patient taking differently: Inject 1 mg into the skin every Wednesday. )  . pantoprazole (PROTONIX) 40 MG tablet Take 1 tablet (40 mg total) by mouth daily. Take 30 minutes before eating in the morning with 8oz water  . polyethylene glycol (MIRALAX / GLYCOLAX) 17 g packet Take 17 g by mouth daily as needed for mild constipation.  Marland Kitchen Propylene Glycol (SYSTANE BALANCE OP) Place 1 drop into both eyes daily as needed (for dry eyes).  . torsemide (DEMADEX) 20 MG tablet Take 2 tablets (40 mg total) by mouth daily.     Allergies:    Ace inhibitors, Amlodipine, Atenolol, Avandia [rosiglitazone], Darvon [propoxyphene], Erythromycin, Hydralazine, Hydrocodone, Levofloxacin, Morphine and related, Percocet [oxycodone-acetaminophen], Spironolactone, and Tramadol   Social  History: Social History   Socioeconomic History  . Marital status: Widowed    Spouse name: Not on file  . Number of children: Not on file  . Years of education: Not on file  . Highest education level: Not on file  Occupational History  . Occupation: Retired  Tobacco Use  . Smoking status: Never Smoker  . Smokeless tobacco: Never Used  Vaping Use  . Vaping Use: Never used  Substance and Sexual Activity  . Alcohol use: No  . Drug use: Not on file  . Sexual activity: Not Currently  Other Topics Concern  . Not on file  Social History Narrative   Lives with daughter   Social Determinants of Health   Financial Resource Strain:   . Difficulty of Paying Living Expenses: Not on file  Food Insecurity:   . Worried About Charity fundraiser in the Last Year: Not on file  . Ran Out of Food in the Last Year: Not on file  Transportation Needs:   . Lack of Transportation (Medical): Not on file  . Lack of Transportation (Non-Medical): Not on file  Physical Activity:   . Days of Exercise per Week: Not on file  . Minutes of Exercise per Session: Not on file  Stress:   . Feeling of Stress : Not on file  Social Connections:   . Frequency of Communication with Friends and Family: Not on file  . Frequency of Social Gatherings with Friends and Family: Not on file  . Attends Religious Services: Not on file  . Active Member of Clubs or Organizations: Not on file  . Attends Archivist Meetings: Not on file  . Marital Status: Not on file     Family History: The patient's family history includes Alcoholism in her father; Cancer in her father; Heart disease in her mother; Hypertension in her mother.  ROS:   All other ROS reviewed and negative. Pertinent positives noted in the HPI.     EKGs/Labs/Other Studies Reviewed:   The following studies were personally reviewed by me today:  TTE 04/03/2020 1. Left ventricular ejection fraction, by estimation, is 55 to 60%. The  left  ventricle has normal function. The left ventricle has no regional  wall motion abnormalities. There is moderate concentric left  ventricular  hypertrophy. Left ventricular  diastolic parameters are consistent with Grade II diastolic dysfunction  (pseudonormalization). Elevated left atrial pressure.  2. Right ventricular systolic function is normal. The right ventricular  size is normal. There is normal pulmonary artery systolic pressure.  3. Left atrial size was mildly dilated.  4. The mitral valve is normal in structure. Mild mitral valve  regurgitation. No evidence of mitral stenosis.  5. The aortic valve is normal in structure. Aortic valve regurgitation is  trivial. No aortic stenosis is present.  6. The inferior vena cava is normal in size with greater than 50%  respiratory variability, suggesting right atrial pressure of 3 mmHg.   NM PYP The study is abnormal. The study is strongly suggestive of TTR amyloidosis (visual of 2 or 3 or H/CL ratio >1.5).  Although the heart/contralateral ratio is only 1.3, the visual score is 3.  Recent Labs: 04/25/2020: ALT 18; Magnesium 1.9 05/03/2020: Hemoglobin 10.0; Platelets 287 05/28/2020: BNP 630.4; BUN 43; Creatinine, Ser 1.81; Potassium 4.9; Sodium 137 06/08/2020: TSH 3.24   Recent Lipid Panel    Component Value Date/Time   CHOL 117 03/24/2020 1428   TRIG 54 03/24/2020 1428   HDL 56 03/24/2020 1428   CHOLHDL 2 05/22/2019 1631   VLDL 23.8 05/22/2019 1631   LDLCALC 49 03/24/2020 1428    Physical Exam:   VS:  BP 90/66 (BP Location: Left Arm, Patient Position: Sitting, Cuff Size: Large)   Pulse 89   Ht 5' 2"  (1.575 m)   Wt 218 lb (98.9 kg)   BMI 39.87 kg/m    Wt Readings from Last 3 Encounters:  06/10/20 218 lb (98.9 kg)  06/08/20 217 lb (98.4 kg)  05/28/20 227 lb 6.4 oz (103.1 kg)    General: Well nourished, well developed, in no acute distress Heart: Atraumatic, normal size  Eyes: PEERLA, EOMI  Neck: Supple, no  JVD Endocrine: No thryomegaly Cardiac: Normal S1, S2; irregular rhythm Lungs: Clear to auscultation bilaterally, no wheezing, rhonchi or rales  Abd: Soft, nontender, no hepatomegaly  Ext: No edema, pulses 2+ Musculoskeletal: No deformities, BUE and BLE strength normal and equal Skin: Warm and dry, no rashes   Neuro: Alert and oriented to person, place, time, and situation, CNII-XII grossly intact, no focal deficits  Psych: Normal mood and affect   ASSESSMENT:   Regina [name] is a 79 y.o. female who presents for the following: 1. Hereditary cardiac amyloidosis ([place])   2. Chronic diastolic heart failure (HCC)   3. Persistent atrial fibrillation ([place])   4. Essential hypertension   5. Coronary artery disease involving native coronary artery of native heart without angina pectoris   6. Stage 3b chronic kidney disease     PLAN:   1. Hereditary cardiac amyloidosis (HCC) -hATTR Cardiac amyloid (Val142Ile mutation).  Grade 2 diastolic dysfunction.  PYP was visually very positive.  We did proceed with genetic testing which does reveal hereditary cardiac amyloidosis.  SPEP and UPEP were negative. -Blood pressure is a bit low.  I do fear she is far along her disease process.  She is tolerating metoprolol well.  We do have plans for TEE/cardioversion on September 7. -She did have a left atrial appendage thrombus which we will need to occlude.  We did discuss that she cannot miss any doses of Eliquis.  She has been doing well with this. -Continue metoprolol 50 mg twice daily.  Continue Eliquis. -We did switch her to torsemide 40 mg daily.  She appears to  be doing well with this.  Weights are down 10 pounds.  Continue this for now. -I have recommended starting tafamidis 61 mg daily.  We did discuss with her disease process this may help slow progression.  We did discuss that I fear she is far along in the disease course.  I also recommended referral to the cardiac amyloidosis clinic at [place] with Dr.  Alvin Critchley.  She has no neurological symptoms and will not qualify for other medications but possibly there is some sort of newer clinical trial she may be able to get into.  Right now her only option is tafamidis.  We will await their evaluation.  I think it is also good for her and her children to get established in this clinic given that she is genetically positive. -I have recommended she complete her genetic counseling through [facility].  I have also recommended her children think seriously about if they would like to be tested or not.  It may be good for them to be evaluated all at [facility] to determine if they want to do any further genetic testing.  2. Chronic diastolic heart failure ([place]) -Secondary to hereditary ATTR amyloidosis.  See above.  3. Persistent atrial fibrillation ([place]) -Rates are controlled on metoprolol.  We will plan for TEE/cardioversion on September 7.  She did have a left atrial appendage thrombus on her most recent attempted cardioversion.  We will likely start amiodarone after this.  I will perform procedure in the hospital that day.  We will discuss further at that appointment.  4. Essential hypertension -She is not tolerating blood pressure medications.  This is related to her cardiac amyloidosis.  See above discussion.  5. Coronary artery disease involving native coronary artery of native heart without angina pectoris -Nonobstructive disease on left heart catheterization in [facility] in 2016.  Her symptoms of chest pain shortness of breath improved with diuresis.  I think this is all volume overload.  6. Stage 3b chronic kidney disease -Stable.  Disposition: Keep appointment on July 10, 2020  Medication Adjustments/Labs and Tests Ordered: Current medicines are reviewed at length with the patient today.  Concerns regarding medicines are outlined above.  Orders Placed This Encounter  Procedures  . Ambulatory referral to Cardiology   Meds ordered  this encounter  Medications  . Tafamidis 61 MG CAPS    Sig: Take 61 mg by mouth daily.    Dispense:  30 capsule    Refill:  6    Patient Instructions  Medication Instructions:  Start Tafamidis 61 mg daily   *If you need a refill on your cardiac medications before your next appointment, please call your pharmacy*    Follow-Up: At [facility], you and your health needs are our priority.  As part of our continuing mission to provide you with exceptional heart care, we have created designated Provider Care Teams.  These Care Teams include your primary Cardiologist (physician) and Advanced Practice Providers (APPs -  Physician Assistants and Nurse Practitioners) who all work together to provide you with the care you need, when you need it.  We recommend signing up for the patient portal called "MyChart".  Sign up information is provided on this After Visit Summary.  MyChart is used to connect with patients for Virtual Visits (Telemedicine).  Patients are able to view lab/test results, encounter notes, upcoming appointments, etc.  Non-urgent messages can be sent to your provider as well.   To learn more about what you can do  with MyChart, go to [email or web address].    Your next appointment:   September 24th  The format for your next appointment:   In Person  Provider:   [name], MD         Time Spent with Patient: I have spent a total of 35 minutes with patient reviewing hospital notes, telemetry, EKGs, labs and examining the patient as well as establishing an assessment and plan that was discussed with the patient.  > 50% of time was spent in direct patient care.  Signed, Addison Naegeli. Audie Box, [place]  [address] [place], [place] [address] [phone]  06/10/2020 10:07 AM

## 2020-06-08 NOTE — H&P (View-Only) (Signed)
Cardiology Office Note:   Date:  06/10/2020  NAME:  Regina [name]    MRN: [id number] DOB:  1941-06-30   PCP:  [name], [name], MD  Cardiologist:  Evalina Field, MD  Electrophysiologist:  None   Referring MD: [name], [name], MD   Chief Complaint  Patient presents with  . Shortness of Breath    History of Present Illness:   Regina [name] is a 79 y.o. female with a hx of hereditary cardiac amyloidosis, persistent atrial fibrillation, diabetes, hypertension, hyperlipidemia, nonobstructive CAD, OSA who presents for follow-up.  Recently admitted for A. fib with RVR.  Had left atrial appendage thrombus.  Plan for 3 weeks of anticoagulation prior to reattempt at cardioversion.  Recent genetic testing is positive for hereditary ATTR amyloid.  We also switch to torsemide due to increased volume.  She is lost 10 pounds on torsemide.  Appears to be doing fairly well.  Her blood pressure is 90/66.  She still has an irregular rhythm of atrial fibrillation.  She reports that her shortness of breath has improved.  She is still washing the salt intake as well as trying to modify her diet.  She denies any further chest pain.  We did go over the results of her genetic testing which demonstrates she has hereditary ATTR cardiac amyloidosis.  She presents with her son today.  We did talk about her prognosis and overall likely progression of disease.  I do feel she is fairly advanced in her disease state.  We discussed starting tafamidis as well as getting over to the [facility] cardiac amyloid clinic.  We also discussed that she will have genetic counseling provided through [facility].  I think her and her children should sit through the session and then they can decide how they would like to proceed with testing.  Clearly this will have a lasting impact for the children.  I also think an evaluation at [facility] will largely help that to determine if there is any clinical trial she can participate in as well as anything  for her children.  She is still taking her Eliquis and not missing any doses.  We still have plans to proceed with TEE/cardioversion on September 7.  We likely will start amiodarone at that visit.  We did discuss this in the office.  Problem List 1.  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 Persistent Afib/LAA slugde vs thrombus 05/05/2020  3. DM -A1c 7.6 4. HLD -T chol 98, HDL 44, LDL 30, TG 119 5. HTN 6. Non-obstructive CAD -LHC 01/19/2015 [place] -50% mid RCA and 50% LAD 7. OSA 8. CKD 3  Past Medical History: Past Medical History:  Diagnosis Date  . (HFpEF) heart failure with preserved ejection fraction ([place])   . Back pain   . Diabetes mellitus without complication ([place])   . Dyspnea   . GERD (gastroesophageal reflux disease)   . H/O echocardiogram    a. 01/2015 Echo: EF 55-60%, Gr 2 DD, mod LVH, mildly dil LA.  Marland Kitchen Heart disease   . Hypertension   . Hypothyroidism   . Joint pain   . Kidney problem   . Lower extremity edema   . Mini stroke ([place])   . Non-obstructive CAD    a. 01/2015 Cardiolite: + inf wall ischemia, EF 56%;  b. 01/2015 Cath: LM nl, LAD 64m LCX min irregs, RCA dominant, 50-656m .Marland Kitchen  Sleep apnea   . Swallowing difficulty   . Thyroid disease     Past Surgical History: Past Surgical History:  Procedure Laterality Date  . ABDOMINAL HYSTERECTOMY    . CARDIAC CATHETERIZATION    . ESOPHAGOGASTRODUODENOSCOPY     a. 01/2015 EGD: patent esophagus.  . ESOPHAGOGASTRODUODENOSCOPY N/A 01/20/2015   Procedure: ESOPHAGOGASTRODUODENOSCOPY (EGD);  Surgeon: Carol Ada, MD;  Location: [facility] ENDOSCOPY;  Service: Endoscopy;  Laterality: N/A;  . HAND SURGERY    . JOINT REPLACEMENT     reports history bilateral TKA and right TSA  . LEFT HEART CATHETERIZATION WITH CORONARY ANGIOGRAM N/A 01/19/2015  . TEE WITHOUT CARDIOVERSION  05/04/2020  . TEE WITHOUT CARDIOVERSION  N/A 05/05/2020   Procedure: TRANSESOPHAGEAL ECHOCARDIOGRAM (TEE);  Surgeon: Pixie Casino, MD;  Location: [facility] ENDOSCOPY;  Service: Cardiovascular;  Laterality: N/A;  . THYROIDECTOMY      Current Medications: Current Meds  Medication Sig  . Alcohol Swabs (B-D SINGLE USE SWABS REGULAR) PADS Use to test blood sugar up to 3 times daily  . allopurinol (ZYLOPRIM) 100 MG tablet Take 1 tablet (100 mg total) by mouth daily.  Marland Kitchen apixaban (ELIQUIS) 5 MG TABS tablet Take 1 tablet (5 mg total) by mouth 2 (two) times daily.  . Ascorbic Acid (VITA-C PO) Take 1 tablet by mouth daily.   Marland Kitchen aspirin EC 81 MG tablet Take 81 mg by mouth daily. Swallow whole.  Marland Kitchen atorvastatin (LIPITOR) 20 MG tablet Take 1 tablet (20 mg total) by mouth daily. (Patient taking differently: Take 20 mg by mouth at bedtime. )  . Blood Glucose Monitoring Suppl (ACCU-CHEK GUIDE) w/Device KIT 1 Device by Does not apply route 3 (three) times daily.  . Cholecalciferol (VITAMIN D3) 50 MCG (2000 UT) capsule Take 2,000 Units by mouth daily.  . clotrimazole-betamethasone (LOTRISONE) cream Apply 1 application topically 2 (two) times daily as needed. (Patient taking differently: Apply 1 application topically 2 (two) times daily as needed (rash). )  . cyclobenzaprine (FLEXERIL) 5 MG tablet TAKE 1 TABLET BY MOUTH ONCE DAILY AS NEEDED  . diclofenac Sodium (VOLTAREN) 1 % GEL Apply 1 application topically daily as needed (arthritis pain/hands).  . famotidine (PEPCID) 20 MG tablet Take 1 tablet (20 mg total) by mouth at bedtime.  . fluticasone (FLONASE) 50 MCG/ACT nasal spray Place 1 spray into both nostrils daily as needed for allergies or rhinitis.  Marland Kitchen gabapentin (NEURONTIN) 600 MG tablet Take 1 tablet by mouth twice daily  . glucose blood (ACCU-CHEK GUIDE) test strip Use to test blood sugar up to 3 times daily  . insulin glargine, 1 Unit Dial, (TOUJEO SOLOSTAR) 300 UNIT/ML Solostar Pen Inject 25 Units into the skin daily. Eat a snack with protein nightly  before bedtime.  . Iron-Vitamins (GERITOL COMPLETE PO) Take 1 tablet by mouth daily.  Marland Kitchen lactulose (CHRONULAC) 10 GM/15ML solution Take 20 g by mouth daily as needed for mild constipation.   . Lancets Misc. (ACCU-CHEK FASTCLIX LANCET) KIT Use to test blood sugar up to 3 times daily  . levothyroxine (SYNTHROID) 150 MCG tablet Take 150 mcg by mouth daily before breakfast.   . loratadine (CLARITIN) 10 MG tablet Take 1 tablet (10 mg total) by mouth daily as needed. (Patient taking differently: Take 10 mg by mouth daily as needed for allergies. )  . magnesium oxide (MAG-OX) 400 (241.3 Mg) MG tablet Take 1 tablet by mouth once daily  . meclizine (ANTIVERT) 25 MG tablet Take 25 mg by mouth daily as needed for dizziness.   Marland Kitchen  metoprolol tartrate (LOPRESSOR) 100 MG tablet Take 0.5 tablets (50 mg total) by mouth 2 (two) times daily.  . nitroGLYCERIN (NITROSTAT) 0.4 MG SL tablet Place 1 tablet (0.4 mg total) under the tongue every 5 (five) minutes as needed for chest pain.  . Omega-3 Fatty Acids (FISH OIL) 1000 MG CAPS Take 1,000 mg by mouth daily.   Marland Kitchen OZEMPIC, 1 MG/DOSE, 2 MG/1.5ML SOPN Inject 0.75 mLs (1 mg total) into the skin once a week. (Patient taking differently: Inject 1 mg into the skin every Wednesday. )  . pantoprazole (PROTONIX) 40 MG tablet Take 1 tablet (40 mg total) by mouth daily. Take 30 minutes before eating in the morning with 8oz water  . polyethylene glycol (MIRALAX / GLYCOLAX) 17 g packet Take 17 g by mouth daily as needed for mild constipation.  Marland Kitchen Propylene Glycol (SYSTANE BALANCE OP) Place 1 drop into both eyes daily as needed (for dry eyes).  . torsemide (DEMADEX) 20 MG tablet Take 2 tablets (40 mg total) by mouth daily.     Allergies:    Ace inhibitors, Amlodipine, Atenolol, Avandia [rosiglitazone], Darvon [propoxyphene], Erythromycin, Hydralazine, Hydrocodone, Levofloxacin, Morphine and related, Percocet [oxycodone-acetaminophen], Spironolactone, and Tramadol   Social  History: Social History   Socioeconomic History  . Marital status: Widowed    Spouse name: Not on file  . Number of children: Not on file  . Years of education: Not on file  . Highest education level: Not on file  Occupational History  . Occupation: Retired  Tobacco Use  . Smoking status: Never Smoker  . Smokeless tobacco: Never Used  Vaping Use  . Vaping Use: Never used  Substance and Sexual Activity  . Alcohol use: No  . Drug use: Not on file  . Sexual activity: Not Currently  Other Topics Concern  . Not on file  Social History Narrative   Lives with daughter   Social Determinants of Health   Financial Resource Strain:   . Difficulty of Paying Living Expenses: Not on file  Food Insecurity:   . Worried About Charity fundraiser in the Last Year: Not on file  . Ran Out of Food in the Last Year: Not on file  Transportation Needs:   . Lack of Transportation (Medical): Not on file  . Lack of Transportation (Non-Medical): Not on file  Physical Activity:   . Days of Exercise per Week: Not on file  . Minutes of Exercise per Session: Not on file  Stress:   . Feeling of Stress : Not on file  Social Connections:   . Frequency of Communication with Friends and Family: Not on file  . Frequency of Social Gatherings with Friends and Family: Not on file  . Attends Religious Services: Not on file  . Active Member of Clubs or Organizations: Not on file  . Attends Archivist Meetings: Not on file  . Marital Status: Not on file     Family History: The patient's family history includes Alcoholism in her father; Cancer in her father; Heart disease in her mother; Hypertension in her mother.  ROS:   All other ROS reviewed and negative. Pertinent positives noted in the HPI.     EKGs/Labs/Other Studies Reviewed:   The following studies were personally reviewed by me today:  TTE 04/03/2020 1. Left ventricular ejection fraction, by estimation, is 55 to 60%. The  left  ventricle has normal function. The left ventricle has no regional  wall motion abnormalities. There is moderate concentric left  ventricular  hypertrophy. Left ventricular  diastolic parameters are consistent with Grade II diastolic dysfunction  (pseudonormalization). Elevated left atrial pressure.  2. Right ventricular systolic function is normal. The right ventricular  size is normal. There is normal pulmonary artery systolic pressure.  3. Left atrial size was mildly dilated.  4. The mitral valve is normal in structure. Mild mitral valve  regurgitation. No evidence of mitral stenosis.  5. The aortic valve is normal in structure. Aortic valve regurgitation is  trivial. No aortic stenosis is present.  6. The inferior vena cava is normal in size with greater than 50%  respiratory variability, suggesting right atrial pressure of 3 mmHg.   NM PYP The study is abnormal. The study is strongly suggestive of TTR amyloidosis (visual of 2 or 3 or H/CL ratio >1.5).  Although the heart/contralateral ratio is only 1.3, the visual score is 3.  Recent Labs: 04/25/2020: ALT 18; Magnesium 1.9 05/03/2020: Hemoglobin 10.0; Platelets 287 05/28/2020: BNP 630.4; BUN 43; Creatinine, Ser 1.81; Potassium 4.9; Sodium 137 06/08/2020: TSH 3.24   Recent Lipid Panel    Component Value Date/Time   CHOL 117 03/24/2020 1428   TRIG 54 03/24/2020 1428   HDL 56 03/24/2020 1428   CHOLHDL 2 05/22/2019 1631   VLDL 23.8 05/22/2019 1631   LDLCALC 49 03/24/2020 1428    Physical Exam:   VS:  BP 90/66 (BP Location: Left Arm, Patient Position: Sitting, Cuff Size: Large)   Pulse 89   Ht 5' 2"  (1.575 m)   Wt 218 lb (98.9 kg)   BMI 39.87 kg/m    Wt Readings from Last 3 Encounters:  06/10/20 218 lb (98.9 kg)  06/08/20 217 lb (98.4 kg)  05/28/20 227 lb 6.4 oz (103.1 kg)    General: Well nourished, well developed, in no acute distress Heart: Atraumatic, normal size  Eyes: PEERLA, EOMI  Neck: Supple, no  JVD Endocrine: No thryomegaly Cardiac: Normal S1, S2; irregular rhythm Lungs: Clear to auscultation bilaterally, no wheezing, rhonchi or rales  Abd: Soft, nontender, no hepatomegaly  Ext: No edema, pulses 2+ Musculoskeletal: No deformities, BUE and BLE strength normal and equal Skin: Warm and dry, no rashes   Neuro: Alert and oriented to person, place, time, and situation, CNII-XII grossly intact, no focal deficits  Psych: Normal mood and affect   ASSESSMENT:   Regina [name] is a 79 y.o. female who presents for the following: 1. Hereditary cardiac amyloidosis ([place])   2. Chronic diastolic heart failure (HCC)   3. Persistent atrial fibrillation ([place])   4. Essential hypertension   5. Coronary artery disease involving native coronary artery of native heart without angina pectoris   6. Stage 3b chronic kidney disease     PLAN:   1. Hereditary cardiac amyloidosis (HCC) -hATTR Cardiac amyloid (Val142Ile mutation).  Grade 2 diastolic dysfunction.  PYP was visually very positive.  We did proceed with genetic testing which does reveal hereditary cardiac amyloidosis.  SPEP and UPEP were negative. -Blood pressure is a bit low.  I do fear she is far along her disease process.  She is tolerating metoprolol well.  We do have plans for TEE/cardioversion on September 7. -She did have a left atrial appendage thrombus which we will need to occlude.  We did discuss that she cannot miss any doses of Eliquis.  She has been doing well with this. -Continue metoprolol 50 mg twice daily.  Continue Eliquis. -We did switch her to torsemide 40 mg daily.  She appears to  be doing well with this.  Weights are down 10 pounds.  Continue this for now. -I have recommended starting tafamidis 61 mg daily.  We did discuss with her disease process this may help slow progression.  We did discuss that I fear she is far along in the disease course.  I also recommended referral to the cardiac amyloidosis clinic at [place] with Dr.  Alvin Critchley.  She has no neurological symptoms and will not qualify for other medications but possibly there is some sort of newer clinical trial she may be able to get into.  Right now her only option is tafamidis.  We will await their evaluation.  I think it is also good for her and her children to get established in this clinic given that she is genetically positive. -I have recommended she complete her genetic counseling through [facility].  I have also recommended her children think seriously about if they would like to be tested or not.  It may be good for them to be evaluated all at [facility] to determine if they want to do any further genetic testing.  2. Chronic diastolic heart failure ([place]) -Secondary to hereditary ATTR amyloidosis.  See above.  3. Persistent atrial fibrillation ([place]) -Rates are controlled on metoprolol.  We will plan for TEE/cardioversion on September 7.  She did have a left atrial appendage thrombus on her most recent attempted cardioversion.  We will likely start amiodarone after this.  I will perform procedure in the hospital that day.  We will discuss further at that appointment.  4. Essential hypertension -She is not tolerating blood pressure medications.  This is related to her cardiac amyloidosis.  See above discussion.  5. Coronary artery disease involving native coronary artery of native heart without angina pectoris -Nonobstructive disease on left heart catheterization in [facility] in 2016.  Her symptoms of chest pain shortness of breath improved with diuresis.  I think this is all volume overload.  6. Stage 3b chronic kidney disease -Stable.  Disposition: Keep appointment on July 10, 2020  Medication Adjustments/Labs and Tests Ordered: Current medicines are reviewed at length with the patient today.  Concerns regarding medicines are outlined above.  Orders Placed This Encounter  Procedures  . Ambulatory referral to Cardiology   Meds ordered  this encounter  Medications  . Tafamidis 61 MG CAPS    Sig: Take 61 mg by mouth daily.    Dispense:  30 capsule    Refill:  6    Patient Instructions  Medication Instructions:  Start Tafamidis 61 mg daily   *If you need a refill on your cardiac medications before your next appointment, please call your pharmacy*    Follow-Up: At [facility], you and your health needs are our priority.  As part of our continuing mission to provide you with exceptional heart care, we have created designated Provider Care Teams.  These Care Teams include your primary Cardiologist (physician) and Advanced Practice Providers (APPs -  Physician Assistants and Nurse Practitioners) who all work together to provide you with the care you need, when you need it.  We recommend signing up for the patient portal called "MyChart".  Sign up information is provided on this After Visit Summary.  MyChart is used to connect with patients for Virtual Visits (Telemedicine).  Patients are able to view lab/test results, encounter notes, upcoming appointments, etc.  Non-urgent messages can be sent to your provider as well.   To learn more about what you can do  with MyChart, go to [email or web address].    Your next appointment:   September 24th  The format for your next appointment:   In Person  Provider:   [name], MD         Time Spent with Patient: I have spent a total of 35 minutes with patient reviewing hospital notes, telemetry, EKGs, labs and examining the patient as well as establishing an assessment and plan that was discussed with the patient.  > 50% of time was spent in direct patient care.  Signed, Addison Naegeli. Audie Box, [place]  [address] [place], [place] [address] [phone]  06/10/2020 10:07 AM

## 2020-06-10 ENCOUNTER — Other Ambulatory Visit: Payer: Self-pay

## 2020-06-10 ENCOUNTER — Encounter: Payer: Self-pay | Admitting: Cardiovascular Disease

## 2020-06-10 ENCOUNTER — Ambulatory Visit: Payer: Medicare Other | Admitting: Cardiovascular Disease

## 2020-06-10 VITALS — BP 90/66 | HR 89 | Ht 62.0 in | Wt 218.0 lb

## 2020-06-10 DIAGNOSIS — I4819 Other persistent atrial fibrillation: Secondary | ICD-10-CM | POA: Diagnosis not present

## 2020-06-10 DIAGNOSIS — I5032 Chronic diastolic (congestive) heart failure: Secondary | ICD-10-CM

## 2020-06-10 DIAGNOSIS — I10 Essential (primary) hypertension: Secondary | ICD-10-CM

## 2020-06-10 DIAGNOSIS — I2510 Atherosclerotic heart disease of native coronary artery without angina pectoris: Secondary | ICD-10-CM | POA: Diagnosis not present

## 2020-06-10 DIAGNOSIS — I43 Cardiomyopathy in diseases classified elsewhere: Secondary | ICD-10-CM

## 2020-06-10 DIAGNOSIS — N1832 Chronic kidney disease, stage 3b: Secondary | ICD-10-CM

## 2020-06-10 DIAGNOSIS — E854 Organ-limited amyloidosis: Secondary | ICD-10-CM

## 2020-06-10 MED ORDER — TAFAMIDIS 61 MG PO CAPS: 61.0000 mg | ORAL_CAPSULE | Freq: Every day | ORAL | 6 refills | Status: DC

## 2020-06-10 NOTE — Patient Instructions (Signed)
Medication Instructions:  Start Tafamidis 61 mg daily   *If you need a refill on your cardiac medications before your next appointment, please call your pharmacy*    Follow-Up: At [facility], you and your health needs are our priority.  As part of our continuing mission to provide you with exceptional heart care, we have created designated Provider Care Teams.  These Care Teams include your primary Cardiologist (physician) and Advanced Practice Providers (APPs -  Physician Assistants and Nurse Practitioners) who all work together to provide you with the care you need, when you need it.  We recommend signing up for the patient portal called "MyChart".  Sign up information is provided on this After Visit Summary.  MyChart is used to connect with patients for Virtual Visits (Telemedicine).  Patients are able to view lab/test results, encounter notes, upcoming appointments, etc.  Non-urgent messages can be sent to your provider as well.   To learn more about what you can do with MyChart, go to [email or web address].    Your next appointment:   September 24th  The format for your next appointment:   In Person  Provider:   [name], MD

## 2020-06-11 ENCOUNTER — Telehealth: Payer: Self-pay

## 2020-06-11 DIAGNOSIS — I43 Cardiomyopathy in diseases classified elsewhere: Secondary | ICD-10-CM

## 2020-06-11 DIAGNOSIS — E854 Organ-limited amyloidosis: Secondary | ICD-10-CM

## 2020-06-11 MED ORDER — TAFAMIDIS 61 MG PO CAPS: 61.0000 mg | ORAL_CAPSULE | Freq: Every day | ORAL | 6 refills | Status: DC

## 2020-06-11 NOTE — Telephone Encounter (Signed)
Had to completed PA for Tafamidis- it was approved and she can now pick it up from the pharmacy.   Will resubmit.    Covermymeds: Approvedtoday Request Reference Number: [id number]. VYNDAMAX CAP 61MG  is approved through 10/16/2020. Your patient may now fill this prescription and it will be covered.

## 2020-06-12 ENCOUNTER — Other Ambulatory Visit: Payer: Self-pay

## 2020-06-12 ENCOUNTER — Other Ambulatory Visit (INDEPENDENT_AMBULATORY_CARE_PROVIDER_SITE_OTHER): Payer: Self-pay | Admitting: Family Medicine

## 2020-06-15 DIAGNOSIS — I129 Hypertensive chronic kidney disease with stage 1 through stage 4 chronic kidney disease, or unspecified chronic kidney disease: Secondary | ICD-10-CM | POA: Diagnosis not present

## 2020-06-15 DIAGNOSIS — I2510 Atherosclerotic heart disease of native coronary artery without angina pectoris: Secondary | ICD-10-CM | POA: Diagnosis not present

## 2020-06-15 DIAGNOSIS — E1122 Type 2 diabetes mellitus with diabetic chronic kidney disease: Secondary | ICD-10-CM | POA: Diagnosis not present

## 2020-06-15 DIAGNOSIS — E785 Hyperlipidemia, unspecified: Secondary | ICD-10-CM | POA: Diagnosis not present

## 2020-06-15 DIAGNOSIS — N189 Chronic kidney disease, unspecified: Secondary | ICD-10-CM | POA: Diagnosis not present

## 2020-06-15 DIAGNOSIS — N1832 Chronic kidney disease, stage 3b: Secondary | ICD-10-CM | POA: Diagnosis not present

## 2020-06-17 ENCOUNTER — Other Ambulatory Visit: Payer: Self-pay | Admitting: Internal Medicine

## 2020-06-17 ENCOUNTER — Telehealth (INDEPENDENT_AMBULATORY_CARE_PROVIDER_SITE_OTHER): Payer: Self-pay

## 2020-06-17 DIAGNOSIS — I129 Hypertensive chronic kidney disease with stage 1 through stage 4 chronic kidney disease, or unspecified chronic kidney disease: Secondary | ICD-10-CM

## 2020-06-17 DIAGNOSIS — G4733 Obstructive sleep apnea (adult) (pediatric): Secondary | ICD-10-CM | POA: Diagnosis not present

## 2020-06-17 DIAGNOSIS — N1832 Chronic kidney disease, stage 3b: Secondary | ICD-10-CM

## 2020-06-17 DIAGNOSIS — E1122 Type 2 diabetes mellitus with diabetic chronic kidney disease: Secondary | ICD-10-CM

## 2020-06-17 NOTE — Telephone Encounter (Signed)
Lov:  04/22/20   Rtc: 2 weeks Pend: no pending  Refill request for Ozempic, pt need appointment My chart message has been sent.

## 2020-06-19 DIAGNOSIS — I4891 Unspecified atrial fibrillation: Secondary | ICD-10-CM | POA: Diagnosis not present

## 2020-06-19 DIAGNOSIS — I43 Cardiomyopathy in diseases classified elsewhere: Secondary | ICD-10-CM | POA: Diagnosis not present

## 2020-06-19 DIAGNOSIS — I5043 Acute on chronic combined systolic (congestive) and diastolic (congestive) heart failure: Secondary | ICD-10-CM | POA: Diagnosis not present

## 2020-06-19 DIAGNOSIS — I2510 Atherosclerotic heart disease of native coronary artery without angina pectoris: Secondary | ICD-10-CM | POA: Diagnosis not present

## 2020-06-19 DIAGNOSIS — E854 Organ-limited amyloidosis: Secondary | ICD-10-CM | POA: Diagnosis not present

## 2020-06-23 ENCOUNTER — Encounter (HOSPITAL_COMMUNITY): Admission: RE | Payer: Medicare Other | Source: Home / Self Care

## 2020-06-23 ENCOUNTER — Ambulatory Visit (HOSPITAL_COMMUNITY): Admission: RE | Admit: 2020-06-23 | Payer: Medicare Other | Source: Home / Self Care | Admitting: Cardiovascular Disease

## 2020-06-23 SURGERY — ECHOCARDIOGRAM, TRANSESOPHAGEAL
Anesthesia: General

## 2020-06-24 ENCOUNTER — Ambulatory Visit
Admission: RE | Admit: 2020-06-24 | Discharge: 2020-06-24 | Disposition: A | Discharge status: Home / Self Care | Payer: Medicare Other | Source: Ambulatory Visit | Attending: Internal Medicine | Admitting: Internal Medicine

## 2020-06-24 ENCOUNTER — Other Ambulatory Visit: Payer: Self-pay

## 2020-06-24 DIAGNOSIS — N1832 Chronic kidney disease, stage 3b: Secondary | ICD-10-CM

## 2020-06-24 DIAGNOSIS — N183 Chronic kidney disease, stage 3 unspecified: Secondary | ICD-10-CM | POA: Diagnosis not present

## 2020-06-24 DIAGNOSIS — I4819 Other persistent atrial fibrillation: Secondary | ICD-10-CM

## 2020-06-24 DIAGNOSIS — N281 Cyst of kidney, acquired: Secondary | ICD-10-CM | POA: Diagnosis not present

## 2020-06-24 DIAGNOSIS — E1122 Type 2 diabetes mellitus with diabetic chronic kidney disease: Secondary | ICD-10-CM

## 2020-06-24 DIAGNOSIS — I129 Hypertensive chronic kidney disease with stage 1 through stage 4 chronic kidney disease, or unspecified chronic kidney disease: Secondary | ICD-10-CM

## 2020-06-25 ENCOUNTER — Telehealth: Payer: Self-pay | Admitting: Family Medicine

## 2020-06-25 MED ORDER — LEVOTHYROXINE SODIUM 150 MCG PO TABS: 150.0000 ug | ORAL_TABLET | Freq: Every day | ORAL | 2 refills | Status: DC

## 2020-06-25 NOTE — Telephone Encounter (Signed)
Pt is calling to get her medication levothyroxine (SYNTHROID) 150 MCG tablet   Refilled. Send to [facility] [place] [facility] [place]  [address] [place] [place] ([place]) [place] [address]  Phone:  [phone] Fax:  [phone]   Please advise

## 2020-06-25 NOTE — Telephone Encounter (Signed)
Rx sent in as requested. 

## 2020-06-27 DIAGNOSIS — E119 Type 2 diabetes mellitus without complications: Secondary | ICD-10-CM | POA: Diagnosis not present

## 2020-06-27 DIAGNOSIS — H40033 Anatomical narrow angle, bilateral: Secondary | ICD-10-CM | POA: Diagnosis not present

## 2020-06-29 ENCOUNTER — Other Ambulatory Visit: Payer: Self-pay

## 2020-06-29 ENCOUNTER — Other Ambulatory Visit (HOSPITAL_COMMUNITY)
Admission: RE | Admit: 2020-06-29 | Discharge: 2020-06-29 | Disposition: A | Discharge status: Home / Self Care | Payer: Medicare Other | Source: Ambulatory Visit | Attending: Internal Medicine | Admitting: Internal Medicine

## 2020-06-29 DIAGNOSIS — Z20822 Contact with and (suspected) exposure to covid-19: Secondary | ICD-10-CM | POA: Diagnosis not present

## 2020-06-29 DIAGNOSIS — Z01812 Encounter for preprocedural laboratory examination: Secondary | ICD-10-CM | POA: Insufficient documentation

## 2020-06-29 DIAGNOSIS — I4819 Other persistent atrial fibrillation: Secondary | ICD-10-CM | POA: Diagnosis not present

## 2020-06-29 LAB — SARS CORONAVIRUS 2 (TAT 6-24 HRS): SARS Coronavirus 2: NEGATIVE

## 2020-06-30 LAB — CBC
Hematocrit: 41.3 % (ref 34.0–46.6)
Hemoglobin: 13.6 g/dL (ref 11.1–15.9)
MCH: 29.4 pg (ref 26.6–33.0)
MCHC: 32.9 g/dL (ref 31.5–35.7)
MCV: 89 fL (ref 79–97)
Platelets: 353 10*3/uL (ref 150–450)
RBC: 4.63 x10E6/uL (ref 3.77–5.28)
RDW: 13.7 % (ref 11.7–15.4)
WBC: 12.4 10*3/uL — ABNORMAL HIGH (ref 3.4–10.8)

## 2020-06-30 LAB — BASIC METABOLIC PANEL
BUN/Creatinine Ratio: 35 — ABNORMAL HIGH (ref 12–28)
BUN: 75 mg/dL — ABNORMAL HIGH (ref 8–27)
CO2: 29 mmol/L (ref 20–29)
Calcium: 10.4 mg/dL — ABNORMAL HIGH (ref 8.7–10.3)
Chloride: 93 mmol/L — ABNORMAL LOW (ref 96–106)
Creatinine, Ser: 2.16 mg/dL — ABNORMAL HIGH (ref 0.57–1.00)
GFR calc Af Amer: 25 mL/min/{1.73_m2} — ABNORMAL LOW (ref >59)
GFR calc non Af Amer: 22 mL/min/{1.73_m2} — ABNORMAL LOW (ref >59)
Glucose: 253 mg/dL — ABNORMAL HIGH (ref 65–99)
Potassium: 4.3 mmol/L (ref 3.5–5.2)
Sodium: 138 mmol/L (ref 134–144)

## 2020-07-01 ENCOUNTER — Ambulatory Visit (HOSPITAL_COMMUNITY): Payer: Medicare Other | Admitting: Anesthesiology

## 2020-07-01 ENCOUNTER — Encounter (HOSPITAL_COMMUNITY)
Admission: RE | Disposition: A | Discharge status: Home / Self Care | Payer: Self-pay | Source: Home / Self Care | Attending: Internal Medicine

## 2020-07-01 ENCOUNTER — Encounter (HOSPITAL_COMMUNITY): Payer: Self-pay | Admitting: Internal Medicine

## 2020-07-01 ENCOUNTER — Ambulatory Visit (HOSPITAL_BASED_OUTPATIENT_CLINIC_OR_DEPARTMENT_OTHER)
Admission: RE | Admit: 2020-07-01 | Discharge: 2020-07-01 | Disposition: A | Discharge status: Home / Self Care | Payer: Medicare Other | Source: Home / Self Care | Attending: Cardiovascular Disease | Admitting: Cardiovascular Disease

## 2020-07-01 ENCOUNTER — Other Ambulatory Visit: Payer: Self-pay

## 2020-07-01 ENCOUNTER — Ambulatory Visit (HOSPITAL_COMMUNITY)
Admission: RE | Admit: 2020-07-01 | Discharge: 2020-07-01 | Disposition: A | Discharge status: Home / Self Care | Payer: Medicare Other | Attending: Internal Medicine | Admitting: Internal Medicine

## 2020-07-01 DIAGNOSIS — Z885 Allergy status to narcotic agent status: Secondary | ICD-10-CM | POA: Diagnosis not present

## 2020-07-01 DIAGNOSIS — E1122 Type 2 diabetes mellitus with diabetic chronic kidney disease: Secondary | ICD-10-CM | POA: Insufficient documentation

## 2020-07-01 DIAGNOSIS — G4730 Sleep apnea, unspecified: Secondary | ICD-10-CM | POA: Insufficient documentation

## 2020-07-01 DIAGNOSIS — K219 Gastro-esophageal reflux disease without esophagitis: Secondary | ICD-10-CM | POA: Insufficient documentation

## 2020-07-01 DIAGNOSIS — G4733 Obstructive sleep apnea (adult) (pediatric): Secondary | ICD-10-CM | POA: Insufficient documentation

## 2020-07-01 DIAGNOSIS — Z8673 Personal history of transient ischemic attack (TIA), and cerebral infarction without residual deficits: Secondary | ICD-10-CM | POA: Insufficient documentation

## 2020-07-01 DIAGNOSIS — I130 Hypertensive heart and chronic kidney disease with heart failure and stage 1 through stage 4 chronic kidney disease, or unspecified chronic kidney disease: Secondary | ICD-10-CM | POA: Insufficient documentation

## 2020-07-01 DIAGNOSIS — I340 Nonrheumatic mitral (valve) insufficiency: Secondary | ICD-10-CM | POA: Diagnosis not present

## 2020-07-01 DIAGNOSIS — Z794 Long term (current) use of insulin: Secondary | ICD-10-CM | POA: Insufficient documentation

## 2020-07-01 DIAGNOSIS — I43 Cardiomyopathy in diseases classified elsewhere: Secondary | ICD-10-CM | POA: Insufficient documentation

## 2020-07-01 DIAGNOSIS — E785 Hyperlipidemia, unspecified: Secondary | ICD-10-CM | POA: Diagnosis not present

## 2020-07-01 DIAGNOSIS — Z9989 Dependence on other enabling machines and devices: Secondary | ICD-10-CM | POA: Diagnosis not present

## 2020-07-01 DIAGNOSIS — N1832 Chronic kidney disease, stage 3b: Secondary | ICD-10-CM | POA: Diagnosis not present

## 2020-07-01 DIAGNOSIS — I5032 Chronic diastolic (congestive) heart failure: Secondary | ICD-10-CM | POA: Diagnosis not present

## 2020-07-01 DIAGNOSIS — Z888 Allergy status to other drugs, medicaments and biological substances status: Secondary | ICD-10-CM | POA: Insufficient documentation

## 2020-07-01 DIAGNOSIS — I2510 Atherosclerotic heart disease of native coronary artery without angina pectoris: Secondary | ICD-10-CM | POA: Diagnosis not present

## 2020-07-01 DIAGNOSIS — I4819 Other persistent atrial fibrillation: Secondary | ICD-10-CM

## 2020-07-01 DIAGNOSIS — Z7901 Long term (current) use of anticoagulants: Secondary | ICD-10-CM | POA: Diagnosis not present

## 2020-07-01 DIAGNOSIS — Z7982 Long term (current) use of aspirin: Secondary | ICD-10-CM | POA: Insufficient documentation

## 2020-07-01 DIAGNOSIS — E039 Hypothyroidism, unspecified: Secondary | ICD-10-CM | POA: Insufficient documentation

## 2020-07-01 DIAGNOSIS — I4891 Unspecified atrial fibrillation: Secondary | ICD-10-CM

## 2020-07-01 DIAGNOSIS — Z79899 Other long term (current) drug therapy: Secondary | ICD-10-CM | POA: Diagnosis not present

## 2020-07-01 DIAGNOSIS — Z881 Allergy status to other antibiotic agents status: Secondary | ICD-10-CM | POA: Diagnosis not present

## 2020-07-01 DIAGNOSIS — I088 Other rheumatic multiple valve diseases: Secondary | ICD-10-CM | POA: Diagnosis not present

## 2020-07-01 DIAGNOSIS — I236 Thrombosis of atrium, auricular appendage, and ventricle as current complications following acute myocardial infarction: Secondary | ICD-10-CM | POA: Diagnosis not present

## 2020-07-01 DIAGNOSIS — E854 Organ-limited amyloidosis: Secondary | ICD-10-CM | POA: Insufficient documentation

## 2020-07-01 HISTORY — PX: TEE WITHOUT CARDIOVERSION: SHX5443

## 2020-07-01 HISTORY — PX: BUBBLE STUDY: SHX6837

## 2020-07-01 LAB — GLUCOSE, CAPILLARY: Glucose-Capillary: 139 mg/dL — ABNORMAL HIGH (ref 70–99)

## 2020-07-01 SURGERY — ECHOCARDIOGRAM, TRANSESOPHAGEAL
Anesthesia: General

## 2020-07-01 MED ORDER — PROPOFOL 500 MG/50ML IV EMUL
INTRAVENOUS | Status: DC | PRN
  Administered 2020-07-01: 75 ug/kg/min via INTRAVENOUS

## 2020-07-01 MED ORDER — PHENYLEPHRINE HCL (PRESSORS) 10 MG/ML IV SOLN
INTRAVENOUS | Status: DC | PRN
  Administered 2020-07-01: 120 ug via INTRAVENOUS
  Administered 2020-07-01 (×2): 80 ug via INTRAVENOUS

## 2020-07-01 MED ORDER — LIDOCAINE HCL (CARDIAC) PF 100 MG/5ML IV SOSY
PREFILLED_SYRINGE | INTRAVENOUS | Status: DC | PRN
  Administered 2020-07-01: 100 mg via INTRAVENOUS

## 2020-07-01 MED ORDER — PHENYLEPHRINE HCL-NACL 10-0.9 MG/250ML-% IV SOLN
INTRAVENOUS | Status: DC | PRN
  Administered 2020-07-01: 40 ug/min via INTRAVENOUS

## 2020-07-01 MED ORDER — SODIUM CHLORIDE 0.9 % IV SOLN: INTRAVENOUS | Status: DC

## 2020-07-01 MED ORDER — PROPOFOL 10 MG/ML IV BOLUS
INTRAVENOUS | Status: DC | PRN
  Administered 2020-07-01: 40 mg via INTRAVENOUS

## 2020-07-01 MED ORDER — GLYCOPYRROLATE 0.2 MG/ML IJ SOLN
INTRAMUSCULAR | Status: DC | PRN
  Administered 2020-07-01: .2 mg via INTRAVENOUS

## 2020-07-01 NOTE — CV Procedure (Signed)
INDICATIONS: Atrial fibrillation - reevaluation for LA appendage thrombus.  PROCEDURE:   Informed consent was obtained prior to the procedure. The risks, benefits and alternatives for the procedure were discussed and the patient comprehended these risks.  Risks include, but are not limited to, cough, sore throat, vomiting, nausea, somnolence, esophageal and stomach trauma or perforation, bleeding, low blood pressure, aspiration, pneumonia, infection, trauma to the teeth and death.    After a procedural time-out, the oropharynx was anesthetized with 20% benzocaine spray.   During this procedure the patient was administered propofol per anesthesia.  The patient's heart rate, blood pressure, and oxygen saturation were monitored continuously during the procedure. The period of conscious sedation was 45 minutes, of which I was present face-to-face 100% of this time.  The transesophageal probe was inserted in the esophagus and stomach without difficulty and multiple views were obtained.  The patient was kept under observation until the patient left the procedure room.  The patient left the procedure room in stable condition.   Agitated microbubble saline contrast was administered.  COMPLICATIONS:    There were no immediate complications.  FINDINGS:   FORMAL ECHOCARDIOGRAM REPORT PENDING EF 15% LA appendage thrombus.  RECOMMENDATIONS:    Cardioversion cancelled.  Discussed with Dr. Audie Box.  Time Spent Directly with the Patient:  60 minutes   Regina [name] 07/01/2020, 10:45 AM

## 2020-07-01 NOTE — Anesthesia Preprocedure Evaluation (Addendum)
Anesthesia Evaluation  Patient identified by MRN, date of birth, ID band Patient awake    Reviewed: Allergy & Precautions, NPO status , Patient's Chart, lab work & pertinent test results, reviewed documented beta blocker date and time   Airway Mallampati: III  TM Distance: >3 FB Neck ROM: Full    Dental no notable dental hx. (+) Missing,    Pulmonary shortness of breath and with exertion, sleep apnea and Continuous Positive Airway Pressure Ventilation ,    Pulmonary exam normal breath sounds clear to auscultation       Cardiovascular Exercise Tolerance: Poor hypertension, Pt. on medications and Pt. on home beta blockers + CAD and +CHF  + dysrhythmias Atrial Fibrillation  Rhythm:Irregular Rate:Normal  Echo 05/05/20 1. Left ventricular ejection fraction, by estimation, is 35 to 40%. The left ventricle has moderately decreased function. The left ventricle demonstrates global hypokinesis. There is severe left ventricular hypertrophy.  2. Right ventricular systolic function is mildly reduced. The right ventricular size is not well visualized.  3. Left atrial size was mildly dilated. A left atrial/left atrial  appendage thrombus was detected. The LAA emptying velocity was 25 cm/s.  4. The mitral valve is grossly normal. Trivial mitral valve  regurgitation.  5. The aortic valve is tricuspid. Aortic valve regurgitation is not visualized.  6. There is mild (Grade II) plaque.  7. Cardioversion not attempted due to LAA thrombus  EKG 05/05/20 Atrial fibrillation Low voltage QRS T wave abnormality, consider lateral ischemia   Neuro/Psych negative neurological ROS     GI/Hepatic GERD  Medicated and Controlled,(+) Hepatitis -, CHep C treated   Endo/Other  diabetes, Poorly Controlled, Type 2, Insulin DependentHypothyroidism Morbid obesityHyperlipidemia  Renal/GU Renal InsufficiencyRenal disease  negative genitourinary    Musculoskeletal  (+) Arthritis , Osteoarthritis,    Abdominal (+) + obese,   Peds  Hematology Eliquis therapy- last dose this am   Anesthesia Other Findings   Reproductive/Obstetrics                          Anesthesia Physical Anesthesia Plan  ASA: III  Anesthesia Plan: General   Post-op Pain Management:    Induction:   PONV Risk Score and Plan: 3 and Treatment may vary due to age or medical condition, Propofol infusion and Ondansetron  Airway Management Planned: Natural Airway, Nasal Cannula and Mask  Additional Equipment:   Intra-op Plan:   Post-operative Plan:   Informed Consent: I have reviewed the patients History and Physical, chart, labs and discussed the procedure including the risks, benefits and alternatives for the proposed anesthesia with the patient or authorized representative who has indicated his/her understanding and acceptance.     Dental advisory given  Plan Discussed with: CRNA and Anesthesiologist  Anesthesia Plan Comments:         Anesthesia Quick Evaluation

## 2020-07-01 NOTE — Anesthesia Postprocedure Evaluation (Addendum)
Anesthesia Post Note  Patient: Regina [name]  Procedure(s) Performed: TRANSESOPHAGEAL ECHOCARDIOGRAM (TEE) (N/A ) BUBBLE STUDY     Patient location during evaluation: PACU Anesthesia Type: General Level of consciousness: awake and alert and oriented Pain management: pain level controlled Vital Signs Assessment: post-procedure vital signs reviewed and stable Respiratory status: spontaneous breathing, nonlabored ventilation and respiratory function stable Cardiovascular status: stable and blood pressure returned to baseline Postop Assessment: no apparent nausea or vomiting Anesthetic complications: no   No complications documented.  Last Vitals:  Vitals:   07/01/20 1045 07/01/20 1055  BP: 112/62 116/72  Pulse: 92 68  Resp: (!) 22 13  Temp:    SpO2: 100% 98%    Last Pain:  Vitals:   07/01/20 1055  TempSrc:   PainSc: 0-No pain                 Brennden Masten A.

## 2020-07-01 NOTE — Transfer of Care (Signed)
Immediate Anesthesia Transfer of Care Note  Patient: Regina [name]  Procedure(s) Performed: TRANSESOPHAGEAL ECHOCARDIOGRAM (TEE) (N/A ) BUBBLE STUDY  Patient Location: PACU and Endoscopy Unit  Anesthesia Type:General  Level of Consciousness: drowsy  Airway & Oxygen Therapy: Patient Spontanous Breathing and Patient connected to nasal cannula oxygen  Post-op Assessment: Report given to RN, Post -op Vital signs reviewed and stable and Patient moving all extremities  Post vital signs: Reviewed and stable  Last Vitals:  Vitals Value Taken Time  BP    Temp    Pulse    Resp    SpO2      Last Pain:  Vitals:   07/01/20 0850  TempSrc: Oral  PainSc: 0-No pain         Complications: No complications documented.

## 2020-07-01 NOTE — Discharge Instructions (Signed)

## 2020-07-01 NOTE — Progress Notes (Signed)
  Echocardiogram Echocardiogram Transesophageal has been performed.  Regina [name] 07/01/2020, 10:42 AM

## 2020-07-01 NOTE — Interval H&P Note (Signed)
History and Physical Interval Note:  07/01/2020 9:14 AM  Regina [name]  has presented today for surgery, with the diagnosis of AFIB.  The various methods of treatment have been discussed with the patient and family. After consideration of risks, benefits and other options for treatment, the patient has consented to  Procedure(s): TRANSESOPHAGEAL ECHOCARDIOGRAM (TEE) (N/A) CARDIOVERSION (N/A) as a surgical intervention.  The patient's history has been reviewed, patient examined, no change in status, stable for surgery.  I have reviewed the patient's chart and labs.  Questions were answered to the patient's satisfaction.     Elouise Munroe

## 2020-07-02 ENCOUNTER — Other Ambulatory Visit: Payer: Self-pay | Admitting: Family Medicine

## 2020-07-02 ENCOUNTER — Encounter (HOSPITAL_COMMUNITY): Payer: Self-pay | Admitting: Internal Medicine

## 2020-07-06 DIAGNOSIS — H04123 Dry eye syndrome of bilateral lacrimal glands: Secondary | ICD-10-CM | POA: Diagnosis not present

## 2020-07-07 ENCOUNTER — Telehealth: Payer: Self-pay | Admitting: Cardiovascular Disease

## 2020-07-07 NOTE — Telephone Encounter (Signed)
Returned call to patient who states she/her son were told she would need new/different medications after TEE was done last week. I am unable to locate any changes noted. Advised MD is off today but will send a message to him and his nurse. Patient has appointment 07/10/20

## 2020-07-07 NOTE — Telephone Encounter (Signed)
Regina [name]:  We will discuss at her appointment. No rush on this.   [name], [place]  [address], [place], [place] [address] [phone]  4:52 PM

## 2020-07-07 NOTE — Telephone Encounter (Signed)
New message:     Patient calling stating that she was to start taking some new medications, but states she has not heard from anyone. Please call patient.

## 2020-07-08 NOTE — Telephone Encounter (Signed)
Patient aware MD will discuss at visit on Friday 07/10/20

## 2020-07-09 NOTE — Progress Notes (Signed)
Cardiology Office Note:   Date:  07/10/2020  NAME:  Regina [name]    MRN: [id number] DOB:  1941-07-29   PCP:  [name], [name], MD  Cardiologist:  Evalina Field, MD  Electrophysiologist:  None   Referring MD: [name], [name], MD   Chief Complaint  Patient presents with  . Follow-up   History of Present Illness:   Regina [name] is a 79 y.o. female with a hx of hATTR cardiac amyloidosis, systolic HF, persistent afib c/b LAA thrombus, DM. HTN, non-obstructive CAD who presents for follow-up. Repeat TEE/DCCV attempt aborted due to persistent LAA thrombus.   She reports has been unable to start tafamidis.  Apparently her co-pay would be $17,000.  She is going back to do next week to see if they have any options for her.  She is also considering participating in a clinical trial.  Her volume status is acceptable today.  Weight is down to 8.  She reports she is not eating well.  Apparently she is very rigorous about salt restriction.  I did inform her that it would be okay to liberalize some of this.  I do want her eating.  She also reports she does get shaky sometimes when she eats.  I do wonder if she is having hypoglycemic events.  Have asked her to be very careful with not eating.  She should check her blood sugars religiously.  We also discussed going down to 20 mg of torsemide daily.  Her blood pressure is 100/52.  She describes no dizziness or lightheadedness.  She describes no rapid heartbeat sensation.  She overall appears to be stable.  She was recently evaluated by nephrology.  Things appear to be stable.  Her most recent creatinine was 2.33 with a GFR of 23.  Most recent BUN was 61.  We did go over her options for anticoagulation.  I think the best option is Coumadin.  Given her kidney function I think Pradaxa would not be a good choice.  She is in agreement with this.  I do want to give her an attempt to get back in a normal rhythm.  She is never had a cardioversion  attempt.  Problem List 1. 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 Persistent Afib -LAA slugde vs thrombus7/20/2021 -persistent sludge 07/01/2020 3. DM -A1c 7.6 4. HLD -T chol 98, HDL 44, LDL 30, TG 119 5. HTN 6. Non-obstructive CAD -LHC 01/19/2015 [facility] -50% mid RCA and 50% LAD 7. OSA 8. CKD 3/4  Past Medical History: Past Medical History:  Diagnosis Date  . (HFpEF) heart failure with preserved ejection fraction ([place])   . Back pain   . 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 Diabetes mellitus without complication ([place])   . Dyspnea   . GERD (gastroesophageal reflux disease)   . H/O echocardiogram    a. 01/2015 Echo: EF 55-60%, Gr 2 DD, mod LVH, mildly dil LA.  Marland Kitchen Heart disease   . Hypertension   . Hypothyroidism   . Joint pain   . Kidney problem   . Lower extremity edema   . Mini stroke ([place])   . Non-obstructive CAD    a. 01/2015 Cardiolite: + inf wall ischemia, EF 56%;  b. 01/2015 Cath: LM nl, LAD 66m LCX min irregs, RCA dominant, 50-640m . Sleep  apnea   . Swallowing difficulty   . Thyroid disease     Past Surgical History: Past Surgical History:  Procedure Laterality Date  . ABDOMINAL HYSTERECTOMY    . BUBBLE STUDY  07/01/2020   Procedure: BUBBLE STUDY;  Surgeon: Elouise Munroe, MD;  Location: [facility];  Service: Cardiovascular;;  . CARDIAC CATHETERIZATION    . ESOPHAGOGASTRODUODENOSCOPY     a. 01/2015 EGD: patent esophagus.  . ESOPHAGOGASTRODUODENOSCOPY N/A 01/20/2015   Procedure: ESOPHAGOGASTRODUODENOSCOPY (EGD);  Surgeon: Carol Ada, MD;  [facility] ENDOSCOPY;  Service: Endoscopy;  Laterality: N/A;  . HAND SURGERY    . JOINT REPLACEMENT     reports history bilateral TKA and right TSA  . LEFT HEART CATHETERIZATION WITH CORONARY ANGIOGRAM N/A 01/19/2015  . TEE WITHOUT CARDIOVERSION   05/04/2020  . TEE WITHOUT CARDIOVERSION N/A 05/05/2020   Procedure: TRANSESOPHAGEAL ECHOCARDIOGRAM (TEE);  Surgeon: Pixie Casino, MD;  Location: [facility] ENDOSCOPY;  Service: Cardiovascular;  Laterality: N/A;  . TEE WITHOUT CARDIOVERSION N/A 07/01/2020   Procedure: TRANSESOPHAGEAL ECHOCARDIOGRAM (TEE);  Surgeon: Elouise Munroe, MD;  Location: [facility] ENDOSCOPY;  Service: Cardiovascular;  Laterality: N/A;  . THYROIDECTOMY      Current Medications: Current Meds  Medication Sig  . Alcohol Swabs (B-D SINGLE USE SWABS REGULAR) PADS Use to test blood sugar up to 3 times daily  . allopurinol (ZYLOPRIM) 100 MG tablet Take 1 tablet (100 mg total) by mouth daily.  . Ascorbic Acid (VITAMIN C) 1000 MG tablet Take 1,000 mg by mouth daily.  Marland Kitchen atorvastatin (LIPITOR) 20 MG tablet Take 1 tablet (20 mg total) by mouth daily.  . Blood Glucose Monitoring Suppl (ACCU-CHEK GUIDE) w/Device KIT 1 Device by Does not apply route 3 (three) times daily.  . Cholecalciferol (VITAMIN D3) 50 MCG (2000 UT) capsule Take 2,000 Units by mouth daily.  . clotrimazole-betamethasone (LOTRISONE) cream Apply 1 application topically 2 (two) times daily as needed. (Patient taking differently: Apply 1 application topically 2 (two) times daily as needed (rash). )  . cyclobenzaprine (FLEXERIL) 5 MG tablet TAKE 1 TABLET BY MOUTH ONCE DAILY AS NEEDED  . diclofenac Sodium (VOLTAREN) 1 % GEL Apply 1 application topically daily as needed (arthritis pain/hands).  . famotidine (PEPCID) 20 MG tablet Take 1 tablet (20 mg total) by mouth at bedtime.  . fluticasone (FLONASE) 50 MCG/ACT nasal spray Place 1 spray into both nostrils daily as needed for allergies or rhinitis.  Marland Kitchen gabapentin (NEURONTIN) 600 MG tablet Take 1 tablet by mouth twice daily  . glucose blood (ACCU-CHEK GUIDE) test strip Use to test blood sugar up to 3 times daily  . insulin glargine, 1 Unit Dial, (TOUJEO SOLOSTAR) 300 UNIT/ML Solostar Pen Inject 25 Units into the skin daily. Eat a  snack with protein nightly before bedtime. (Patient taking differently: Inject 25 Units into the skin daily as needed (Take if sugar is over 150). Eat a snack with protein nightly before bedtime.)  . Iron-Vitamins (GERITOL COMPLETE PO) Take 1 tablet by mouth daily.  Marland Kitchen lactulose (CHRONULAC) 10 GM/15ML solution Take 20 g by mouth daily as needed for mild constipation.   . Lancets Misc. (ACCU-CHEK FASTCLIX LANCET) KIT Use to test blood sugar up to 3 times daily  . levothyroxine (SYNTHROID) 150 MCG tablet Take 1 tablet (150 mcg total) by mouth daily before breakfast.  . loratadine (CLARITIN) 10 MG tablet Take 1 tablet (10 mg total) by mouth daily as needed. (Patient taking differently: Take 10 mg by mouth at bedtime. )  .  magnesium oxide (MAG-OX) 400 (241.3 Mg) MG tablet Take 1 tablet by mouth once daily (Patient taking differently: Take 400 mg by mouth daily. )  . meclizine (ANTIVERT) 25 MG tablet Take 25 mg by mouth daily as needed for dizziness.   . metoprolol tartrate (LOPRESSOR) 100 MG tablet Take 0.5 tablets (50 mg total) by mouth 2 (two) times daily.  . nitroGLYCERIN (NITROSTAT) 0.4 MG SL tablet Place 1 tablet (0.4 mg total) under the tongue every 5 (five) minutes as needed for chest pain.  . Omega-3 Fatty Acids (FISH OIL) 1000 MG CAPS Take 2,000 mg by mouth daily. With vitamin D 3 1000 units  . pantoprazole (PROTONIX) 40 MG tablet Take 1 tablet (40 mg total) by mouth daily. Take 30 minutes before eating in the morning with 8oz water  . polyethylene glycol (MIRALAX / GLYCOLAX) 17 g packet Take 17 g by mouth daily as needed for mild constipation.  Marland Kitchen Propylene Glycol (SYSTANE BALANCE OP) Place 1 drop into both eyes daily as needed (for dry eyes).  . Tafamidis 61 MG CAPS Take 61 mg by mouth daily.  Marland Kitchen torsemide (DEMADEX) 20 MG tablet Take 1 tablet (20 mg total) by mouth daily.  . [DISCONTINUED] apixaban (ELIQUIS) 5 MG TABS tablet Take 1 tablet (5 mg total) by mouth 2 (two) times daily.  .  [DISCONTINUED] metoprolol tartrate (LOPRESSOR) 100 MG tablet Take 0.5 tablets (50 mg total) by mouth 2 (two) times daily.  . [DISCONTINUED] torsemide (DEMADEX) 20 MG tablet Take 2 tablets (40 mg total) by mouth daily.     Allergies:    Ace inhibitors, Amlodipine, Atenolol, Avandia [rosiglitazone], Darvon [propoxyphene], Erythromycin, Hydralazine, Hydrocodone, Levofloxacin, Morphine and related, Percocet [oxycodone-acetaminophen], Spironolactone, and Tramadol   Social History: Social History   Socioeconomic History  . Marital status: Widowed    Spouse name: Not on file  . Number of children: Not on file  . Years of education: Not on file  . Highest education level: Not on file  Occupational History  . Occupation: Retired  Tobacco Use  . Smoking status: Never Smoker  . Smokeless tobacco: Never Used  Vaping Use  . Vaping Use: Never used  Substance and Sexual Activity  . Alcohol use: No  . Drug use: Not on file  . Sexual activity: Not Currently  Other Topics Concern  . Not on file  Social History Narrative   Lives with daughter   Social Determinants of Health   Financial Resource Strain:   . Difficulty of Paying Living Expenses: Not on file  Food Insecurity:   . Worried About Charity fundraiser in the Last Year: Not on file  . Ran Out of Food in the Last Year: Not on file  Transportation Needs:   . Lack of Transportation (Medical): Not on file  . Lack of Transportation (Non-Medical): Not on file  Physical Activity:   . Days of Exercise per Week: Not on file  . Minutes of Exercise per Session: Not on file  Stress:   . Feeling of Stress : Not on file  Social Connections:   . Frequency of Communication with Friends and Family: Not on file  . Frequency of Social Gatherings with Friends and Family: Not on file  . Attends Religious Services: Not on file  . Active Member of Clubs or Organizations: Not on file  . Attends Archivist Meetings: Not on file  . Marital  Status: Not on file    Family History: The patient's family history  includes Alcoholism in her father; Cancer in her father; Heart disease in her mother; Hypertension in her mother.  ROS:   All other ROS reviewed and negative. Pertinent positives noted in the HPI.     EKGs/Labs/Other Studies Reviewed:   The following studies were personally reviewed by me today:  TEE 07/02/2020 1. Cardioversion not performed due to LA appendage thrombus.  2. Left ventricular ejection fraction, by estimation, is 15%. The left  ventricle has severely decreased function. The left ventricle demonstrates  global hypokinesis.  3. Right ventricular systolic function is severely reduced. The right  ventricular size is mildly enlarged.  4. Left atrial size was moderately dilated. A left atrial/left atrial  appendage thrombus was detected.  5. Right atrial size was moderately dilated.  6. The mitral valve is grossly normal. Mild mitral valve regurgitation.  7. The aortic valve is tricuspid. Aortic valve regurgitation is not  visualized.  8. There is mild (Grade II) atheroma plaque involving the descending  aorta.  9. Agitated saline contrast bubble study was negative, with no evidence  of any interatrial shunt.   Recent Labs: 04/25/2020: ALT 18; Magnesium 1.9 05/28/2020: BNP 630.4 06/08/2020: TSH 3.24 06/29/2020: BUN 75; Creatinine, Ser 2.16; Hemoglobin 13.6; Platelets 353; Potassium 4.3; Sodium 138   Recent Lipid Panel    Component Value Date/Time   CHOL 117 03/24/2020 1428   TRIG 54 03/24/2020 1428   HDL 56 03/24/2020 1428   CHOLHDL 2 05/22/2019 1631   VLDL 23.8 05/22/2019 1631   LDLCALC 49 03/24/2020 1428    Physical Exam:   VS:  BP (!) 100/52   Pulse (!) 108   Ht _0  (1.549 m)   Wt 208 lb (94.3 kg)   SpO2 97%   BMI 39.30 kg/m    Wt Readings from Last 3 Encounters:  07/10/20 208 lb (94.3 kg)  07/01/20 208 lb 12.8 oz (94.7 kg)  06/10/20 218 lb (98.9 kg)    General: Well  nourished, well developed, in no acute distress Heart: Atraumatic, normal size  Eyes: PEERLA, EOMI  Neck: Supple, no JVD Endocrine: No thryomegaly Cardiac: Normal S1, S2; irregular rhythm, no murmurs rubs or gallops Lungs: Clear to auscultation bilaterally, no wheezing, rhonchi or rales  Abd: Soft, nontender, no hepatomegaly  Ext: No edema, pulses 2+ Musculoskeletal: No deformities, BUE and BLE strength normal and equal Skin: Warm and dry, no rashes   Neuro: Alert and oriented to person, place, time, and situation, CNII-XII grossly intact, no focal deficits  Psych: Normal mood and affect   ASSESSMENT:   Regina [name] is a 79 y.o. female who presents for the following: 1. Persistent atrial fibrillation ([place])   2. Thrombus of left atrial appendage   3. Adequate anticoagulation on anticoagulant therapy   4. Hereditary cardiac amyloidosis (HCC)   5. Chronic diastolic heart failure ([place])   6. Essential hypertension   7. Atrial fibrillation with RVR ([place])     PLAN:   1. Persistent atrial fibrillation ([place]) 2. Thrombus of left atrial appendage 3. Adequate anticoagulation on anticoagulant therapy -She has had 2 attempts a TEE/cardioversion without success due to concerns for left atrial appendage thrombus.  She has been on Eliquis 5 mg twice daily.  To me she has failed Eliquis.  Given her GFR of 23 I am reluctant to start Pradaxa.  I think the only choice we have is Coumadin.  We will start her on 5 mg daily.  I will set her up for a Coumadin  clinic visit early next week. -We will set her up for TEE/cardioversion on 08/05/2020.  I will be performing the procedure that day.  She will be able to get therapeutic on Coumadin by that time. -I do have plans to start amiodarone after she is safely cardioverted.  4. Hereditary cardiac amyloidosis ([place]) 5. Chronic diastolic heart failure (HCC) -V142I genotype positive.  Children have decided against genetic testing.  -Initially her EF was  preserved.  Her most recent TEE shows her EF is around 15 to 20%.  I suspect this is related atrial fibrillation.  Nonetheless she appears to be doing quite well.  Her volume status is acceptable today.  We actually will reduce her torsemide to 20 mg daily. -We will continue her metoprolol tartrate 25 mg twice daily. -I am reluctant to add any heart failure medications such as an ACE/ARB or Arni given her CKD.  She also is hypotensive most of the time.  I do not think she will tolerate any other medication.  We will get her back in sinus rhythm and then reassess her echocardiogram. -She will also see Dr. Particia Jasper at [facility] who may be able to offer some assistance in medications as well.  6. Essential hypertension -Well-controlled on current medications.  Disposition: Return in about 6 weeks (around 08/21/2020).  Medication Adjustments/Labs and Tests Ordered: Current medicines are reviewed at length with the patient today.  Concerns regarding medicines are outlined above.  Orders Placed This Encounter  Procedures  . Protime-INR  . CBC  . Basic metabolic panel   Meds ordered this encounter  Medications  . torsemide (DEMADEX) 20 MG tablet    Sig: Take 1 tablet (20 mg total) by mouth daily.    Dispense:  180 tablet    Refill:  3  . warfarin (COUMADIN) 5 MG tablet    Sig: Take 1 tablet (5 mg total) by mouth daily.    Dispense:  90 tablet    Refill:  3  . metoprolol tartrate (LOPRESSOR) 100 MG tablet    Sig: Take 0.5 tablets (50 mg total) by mouth 2 (two) times daily.    Dispense:  60 tablet    Refill:  3    Patient Instructions  Medication Instructions:  STOP ELIQUIS  START COUMADIN 5 mg (until you see PHARMD)  DECREASE TORSEMIDE 20 mg daily   *If you need a refill on your cardiac medications before your next appointment, please call your pharmacy*   Lab Work: CBC, BMET, PT/INR 3 days before CARDIOVERSION  COVID TESTING: OCT 18th at 10:00 ([place], [place]  [place]) If you have labs (blood work) drawn today and your tests are completely normal, you will receive your results only by: Marland Kitchen MyChart Message (if you have MyChart) OR . A paper copy in the mail If you have any lab test that is abnormal or we need to change your treatment, we will call you to review the results.   Testing/Procedures:  Your physician has requested that you have a TEE/Cardioversion. During a TEE, sound waves are used to create images of your heart. It provides your doctor with information about the size and shape of your heart and how well your heart's chambers and valves are working. In this test, a transducer is attached to the end of a flexible tube that is guided down you throat and into your esophagus (the tube leading from your mouth to your stomach) to get a more detailed image of your heart. Once  the TEE has determined that a blood clot is not present, the cardioversion begins. Electrical Cardioversion uses a jolt of electricity to your heart either through paddles or wired patches attached to your chest. This is a controlled, usually prescheduled, procedure. This procedure is done at the hospital and you are not awake during the procedure. You usually go home the day of the procedure. Please see the instruction sheet given to you today for more information.     Follow-Up: At [facility], you and your health needs are our priority.  As part of our continuing mission to provide you with exceptional heart care, we have created designated Provider Care Teams.  These Care Teams include your primary Cardiologist (physician) and Advanced Practice Providers (APPs -  Physician Assistants and Nurse Practitioners) who all work together to provide you with the care you need, when you need it.  We recommend signing up for the patient portal called "MyChart".  Sign up information is provided on this After Visit Summary.  MyChart is used to connect with patients for Virtual Visits  (Telemedicine).  Patients are able to view lab/test results, encounter notes, upcoming appointments, etc.  Non-urgent messages can be sent to your provider as well.   To learn more about what you can do with MyChart, go to [email or web address].    Your next appointment:   1 week(s) post CARDIOVERSION  The format for your next appointment:   In Person  Provider:   [name], MD    COUMADIN APPT 09/27 @ 10:30 AM   Other Instructions  You are scheduled for a TEE/Cardioversion/TEE Cardioversion on October 20th with Dr. Audie Box.  Please arrive at the [facility] (Main Entrance A) at [facility]: [place], [place] [address] at 8:30 am. (1 hour prior to procedure unless lab work is needed; if lab work is needed arrive 1.5 hours ahead)  DIET: Nothing to eat or drink after midnight except a sip of water with medications (see medication instructions below)  Medication Instructions:  Continue your anticoagulant: Coumadin  You will need to continue your anticoagulant after your procedure until you  are told by your  Provider that it is safe to stop   Labs: PT/INR, CBC, BMET 3 days before CARDIOVERSION  You must have a responsible person to drive you home and stay in the waiting area during your procedure. Failure to do so could result in cancellation.  Bring your insurance cards.  *Special Note: Every effort is made to have your procedure done on time. Occasionally there are emergencies that occur at the hospital that may cause delays. Please be patient if a delay does occur.       Time Spent with Patient: I have spent a total of 35 minutes with patient reviewing hospital notes, telemetry, EKGs, labs and examining the patient as well as establishing an assessment and plan that was discussed with the patient.  > 50% of time was spent in direct patient care.  Signed, Addison Naegeli. Audie Box, [place]  [address], [place], [place] [address] [phone]  07/10/2020 9:59 AM

## 2020-07-09 NOTE — H&P (View-Only) (Signed)
Cardiology Office Note:   Date:  07/10/2020  NAME:  Regina [name]    MRN: [id number] DOB:  1941-07-29   PCP:  [name], [name], MD  Cardiologist:  Evalina Field, MD  Electrophysiologist:  None   Referring MD: [name], [name], MD   Chief Complaint  Patient presents with  . Follow-up   History of Present Illness:   Regina [name] is a 79 y.o. female with a hx of hATTR cardiac amyloidosis, systolic HF, persistent afib c/b LAA thrombus, DM. HTN, non-obstructive CAD who presents for follow-up. Repeat TEE/DCCV attempt aborted due to persistent LAA thrombus.   She reports has been unable to start tafamidis.  Apparently her co-pay would be $17,000.  She is going back to do next week to see if they have any options for her.  She is also considering participating in a clinical trial.  Her volume status is acceptable today.  Weight is down to 8.  She reports she is not eating well.  Apparently she is very rigorous about salt restriction.  I did inform her that it would be okay to liberalize some of this.  I do want her eating.  She also reports she does get shaky sometimes when she eats.  I do wonder if she is having hypoglycemic events.  Have asked her to be very careful with not eating.  She should check her blood sugars religiously.  We also discussed going down to 20 mg of torsemide daily.  Her blood pressure is 100/52.  She describes no dizziness or lightheadedness.  She describes no rapid heartbeat sensation.  She overall appears to be stable.  She was recently evaluated by nephrology.  Things appear to be stable.  Her most recent creatinine was 2.33 with a GFR of 23.  Most recent BUN was 61.  We did go over her options for anticoagulation.  I think the best option is Coumadin.  Given her kidney function I think Pradaxa would not be a good choice.  She is in agreement with this.  I do want to give her an attempt to get back in a normal rhythm.  She is never had a cardioversion  attempt.  Problem List 1. 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 Persistent Afib -LAA slugde vs thrombus7/20/2021 -persistent sludge 07/01/2020 3. DM -A1c 7.6 4. HLD -T chol 98, HDL 44, LDL 30, TG 119 5. HTN 6. Non-obstructive CAD -LHC 01/19/2015 [place] -50% mid RCA and 50% LAD 7. OSA 8. CKD 3/4  Past Medical History: Past Medical History:  Diagnosis Date  . (HFpEF) heart failure with preserved ejection fraction ([place])   . Back pain   . 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 Diabetes mellitus without complication ([place])   . Dyspnea   . GERD (gastroesophageal reflux disease)   . H/O echocardiogram    a. 01/2015 Echo: EF 55-60%, Gr 2 DD, mod LVH, mildly dil LA.  Marland Kitchen Heart disease   . Hypertension   . Hypothyroidism   . Joint pain   . Kidney problem   . Lower extremity edema   . Mini stroke ([place])   . Non-obstructive CAD    a. 01/2015 Cardiolite: + inf wall ischemia, EF 56%;  b. 01/2015 Cath: LM nl, LAD 66m LCX min irregs, RCA dominant, 50-640m . Sleep  apnea   . Swallowing difficulty   . Thyroid disease     Past Surgical History: Past Surgical History:  Procedure Laterality Date  . ABDOMINAL HYSTERECTOMY    . BUBBLE STUDY  07/01/2020   Procedure: BUBBLE STUDY;  Surgeon: Acharya, Gayatri A, MD;  Location: [facility];  Service: Cardiovascular;;  . CARDIAC CATHETERIZATION    . ESOPHAGOGASTRODUODENOSCOPY     a. 01/2015 EGD: patent esophagus.  . ESOPHAGOGASTRODUODENOSCOPY N/A 01/20/2015   Procedure: ESOPHAGOGASTRODUODENOSCOPY (EGD);  Surgeon: Patrick Hung, MD;  Location: [facility];  Service: Endoscopy;  Laterality: N/A;  . HAND SURGERY    . JOINT REPLACEMENT     reports history bilateral TKA and right TSA  . LEFT HEART CATHETERIZATION WITH CORONARY ANGIOGRAM N/A 01/19/2015  . TEE WITHOUT CARDIOVERSION   05/04/2020  . TEE WITHOUT CARDIOVERSION N/A 05/05/2020   Procedure: TRANSESOPHAGEAL ECHOCARDIOGRAM (TEE);  Surgeon: Hilty, Kenneth C, MD;  Location: [facility];  Service: Cardiovascular;  Laterality: N/A;  . TEE WITHOUT CARDIOVERSION N/A 07/01/2020   Procedure: TRANSESOPHAGEAL ECHOCARDIOGRAM (TEE);  Surgeon: Acharya, Gayatri A, MD;  Location: [facility];  Service: Cardiovascular;  Laterality: N/A;  . THYROIDECTOMY      Current Medications: Current Meds  Medication Sig  . Alcohol Swabs (B-D SINGLE USE SWABS REGULAR) PADS Use to test blood sugar up to 3 times daily  . allopurinol (ZYLOPRIM) 100 MG tablet Take 1 tablet (100 mg total) by mouth daily.  . Ascorbic Acid (VITAMIN C) 1000 MG tablet Take 1,000 mg by mouth daily.  . atorvastatin (LIPITOR) 20 MG tablet Take 1 tablet (20 mg total) by mouth daily.  . Blood Glucose Monitoring Suppl (ACCU-CHEK GUIDE) w/Device KIT 1 Device by Does not apply route 3 (three) times daily.  . Cholecalciferol (VITAMIN D3) 50 MCG (2000 UT) capsule Take 2,000 Units by mouth daily.  . clotrimazole-betamethasone (LOTRISONE) cream Apply 1 application topically 2 (two) times daily as needed. (Patient taking differently: Apply 1 application topically 2 (two) times daily as needed (rash). )  . cyclobenzaprine (FLEXERIL) 5 MG tablet TAKE 1 TABLET BY MOUTH ONCE DAILY AS NEEDED  . diclofenac Sodium (VOLTAREN) 1 % GEL Apply 1 application topically daily as needed (arthritis pain/hands).  . famotidine (PEPCID) 20 MG tablet Take 1 tablet (20 mg total) by mouth at bedtime.  . fluticasone (FLONASE) 50 MCG/ACT nasal spray Place 1 spray into both nostrils daily as needed for allergies or rhinitis.  . gabapentin (NEURONTIN) 600 MG tablet Take 1 tablet by mouth twice daily  . glucose blood (ACCU-CHEK GUIDE) test strip Use to test blood sugar up to 3 times daily  . insulin glargine, 1 Unit Dial, (TOUJEO SOLOSTAR) 300 UNIT/ML Solostar Pen Inject 25 Units into the skin daily. Eat a  snack with protein nightly before bedtime. (Patient taking differently: Inject 25 Units into the skin daily as needed (Take if sugar is over 150). Eat a snack with protein nightly before bedtime.)  . Iron-Vitamins (GERITOL COMPLETE PO) Take 1 tablet by mouth daily.  . lactulose (CHRONULAC) 10 GM/15ML solution Take 20 g by mouth daily as needed for mild constipation.   . Lancets Misc. (ACCU-CHEK FASTCLIX LANCET) KIT Use to test blood sugar up to 3 times daily  . levothyroxine (SYNTHROID) 150 MCG tablet Take 1 tablet (150 mcg total) by mouth daily before breakfast.  . loratadine (CLARITIN) 10 MG tablet Take 1 tablet (10 mg total) by mouth daily as needed. (Patient taking differently: Take 10 mg by mouth at bedtime. )  .   magnesium oxide (MAG-OX) 400 (241.3 Mg) MG tablet Take 1 tablet by mouth once daily (Patient taking differently: Take 400 mg by mouth daily. )  . meclizine (ANTIVERT) 25 MG tablet Take 25 mg by mouth daily as needed for dizziness.   . metoprolol tartrate (LOPRESSOR) 100 MG tablet Take 0.5 tablets (50 mg total) by mouth 2 (two) times daily.  . nitroGLYCERIN (NITROSTAT) 0.4 MG SL tablet Place 1 tablet (0.4 mg total) under the tongue every 5 (five) minutes as needed for chest pain.  . Omega-3 Fatty Acids (FISH OIL) 1000 MG CAPS Take 2,000 mg by mouth daily. With vitamin D 3 1000 units  . pantoprazole (PROTONIX) 40 MG tablet Take 1 tablet (40 mg total) by mouth daily. Take 30 minutes before eating in the morning with 8oz water  . polyethylene glycol (MIRALAX / GLYCOLAX) 17 g packet Take 17 g by mouth daily as needed for mild constipation.  . Propylene Glycol (SYSTANE BALANCE OP) Place 1 drop into both eyes daily as needed (for dry eyes).  . Tafamidis 61 MG CAPS Take 61 mg by mouth daily.  . torsemide (DEMADEX) 20 MG tablet Take 1 tablet (20 mg total) by mouth daily.  . [DISCONTINUED] apixaban (ELIQUIS) 5 MG TABS tablet Take 1 tablet (5 mg total) by mouth 2 (two) times daily.  .  [DISCONTINUED] metoprolol tartrate (LOPRESSOR) 100 MG tablet Take 0.5 tablets (50 mg total) by mouth 2 (two) times daily.  . [DISCONTINUED] torsemide (DEMADEX) 20 MG tablet Take 2 tablets (40 mg total) by mouth daily.     Allergies:    Ace inhibitors, Amlodipine, Atenolol, Avandia [rosiglitazone], Darvon [propoxyphene], Erythromycin, Hydralazine, Hydrocodone, Levofloxacin, Morphine and related, Percocet [oxycodone-acetaminophen], Spironolactone, and Tramadol   Social History: Social History   Socioeconomic History  . Marital status: Widowed    Spouse name: Not on file  . Number of children: Not on file  . Years of education: Not on file  . Highest education level: Not on file  Occupational History  . Occupation: Retired  Tobacco Use  . Smoking status: Never Smoker  . Smokeless tobacco: Never Used  Vaping Use  . Vaping Use: Never used  Substance and Sexual Activity  . Alcohol use: No  . Drug use: Not on file  . Sexual activity: Not Currently  Other Topics Concern  . Not on file  Social History Narrative   Lives with daughter   Social Determinants of Health   Financial Resource Strain:   . Difficulty of Paying Living Expenses: Not on file  Food Insecurity:   . Worried About Running Out of Food in the Last Year: Not on file  . Ran Out of Food in the Last Year: Not on file  Transportation Needs:   . Lack of Transportation (Medical): Not on file  . Lack of Transportation (Non-Medical): Not on file  Physical Activity:   . Days of Exercise per Week: Not on file  . Minutes of Exercise per Session: Not on file  Stress:   . Feeling of Stress : Not on file  Social Connections:   . Frequency of Communication with Friends and Family: Not on file  . Frequency of Social Gatherings with Friends and Family: Not on file  . Attends Religious Services: Not on file  . Active Member of Clubs or Organizations: Not on file  . Attends Club or Organization Meetings: Not on file  . Marital  Status: Not on file    Family History: The patient's family history   includes Alcoholism in her father; Cancer in her father; Heart disease in her mother; Hypertension in her mother.  ROS:   All other ROS reviewed and negative. Pertinent positives noted in the HPI.     EKGs/Labs/Other Studies Reviewed:   The following studies were personally reviewed by me today:  TEE 07/02/2020 1. Cardioversion not performed due to LA appendage thrombus.  2. Left ventricular ejection fraction, by estimation, is 15%. The left  ventricle has severely decreased function. The left ventricle demonstrates  global hypokinesis.  3. Right ventricular systolic function is severely reduced. The right  ventricular size is mildly enlarged.  4. Left atrial size was moderately dilated. A left atrial/left atrial  appendage thrombus was detected.  5. Right atrial size was moderately dilated.  6. The mitral valve is grossly normal. Mild mitral valve regurgitation.  7. The aortic valve is tricuspid. Aortic valve regurgitation is not  visualized.  8. There is mild (Grade II) atheroma plaque involving the descending  aorta.  9. Agitated saline contrast bubble study was negative, with no evidence  of any interatrial shunt.   Recent Labs: 04/25/2020: ALT 18; Magnesium 1.9 05/28/2020: BNP 630.4 06/08/2020: TSH 3.24 06/29/2020: BUN 75; Creatinine, Ser 2.16; Hemoglobin 13.6; Platelets 353; Potassium 4.3; Sodium 138   Recent Lipid Panel    Component Value Date/Time   CHOL 117 03/24/2020 1428   TRIG 54 03/24/2020 1428   HDL 56 03/24/2020 1428   CHOLHDL 2 05/22/2019 1631   VLDL 23.8 05/22/2019 1631   LDLCALC 49 03/24/2020 1428    Physical Exam:   VS:  BP (!) 100/52   Pulse (!) 108   Ht _0  (1.549 m)   Wt 208 lb (94.3 kg)   SpO2 97%   BMI 39.30 kg/m    Wt Readings from Last 3 Encounters:  07/10/20 208 lb (94.3 kg)  07/01/20 208 lb 12.8 oz (94.7 kg)  06/10/20 218 lb (98.9 kg)    General: Well  nourished, well developed, in no acute distress Heart: Atraumatic, normal size  Eyes: PEERLA, EOMI  Neck: Supple, no JVD Endocrine: No thryomegaly Cardiac: Normal S1, S2; irregular rhythm, no murmurs rubs or gallops Lungs: Clear to auscultation bilaterally, no wheezing, rhonchi or rales  Abd: Soft, nontender, no hepatomegaly  Ext: No edema, pulses 2+ Musculoskeletal: No deformities, BUE and BLE strength normal and equal Skin: Warm and dry, no rashes   Neuro: Alert and oriented to person, place, time, and situation, CNII-XII grossly intact, no focal deficits  Psych: Normal mood and affect   ASSESSMENT:   ARBELL WYCOFF is a 79 y.o. female who presents for the following: 1. Persistent atrial fibrillation ([place])   2. Thrombus of left atrial appendage   3. Adequate anticoagulation on anticoagulant therapy   4. Hereditary cardiac amyloidosis (HCC)   5. Chronic diastolic heart failure ([place])   6. Essential hypertension   7. Atrial fibrillation with RVR ([place])     PLAN:   1. Persistent atrial fibrillation ([place]) 2. Thrombus of left atrial appendage 3. Adequate anticoagulation on anticoagulant therapy -She has had 2 attempts a TEE/cardioversion without success due to concerns for left atrial appendage thrombus.  She has been on Eliquis 5 mg twice daily.  To me she has failed Eliquis.  Given her GFR of 23 I am reluctant to start Pradaxa.  I think the only choice we have is Coumadin.  We will start her on 5 mg daily.  I will set her up for a Coumadin  clinic visit early next week. -We will set her up for TEE/cardioversion on 08/05/2020.  I will be performing the procedure that day.  She will be able to get therapeutic on Coumadin by that time. -I do have plans to start amiodarone after she is safely cardioverted.  4. Hereditary cardiac amyloidosis (HCC) 5. Chronic diastolic heart failure (HCC) -V142I genotype positive.  Children have decided against genetic testing.  -Initially her EF was  preserved.  Her most recent TEE shows her EF is around 15 to 20%.  I suspect this is related atrial fibrillation.  Nonetheless she appears to be doing quite well.  Her volume status is acceptable today.  We actually will reduce her torsemide to 20 mg daily. -We will continue her metoprolol tartrate 25 mg twice daily. -I am reluctant to add any heart failure medications such as an ACE/ARB or Arni given her CKD.  She also is hypotensive most of the time.  I do not think she will tolerate any other medication.  We will get her back in sinus rhythm and then reassess her echocardiogram. -She will also see Dr. Michael Khouri at [facility] who may be able to offer some assistance in medications as well.  6. Essential hypertension -Well-controlled on current medications.  Disposition: Return in about 6 weeks (around 08/21/2020).  Medication Adjustments/Labs and Tests Ordered: Current medicines are reviewed at length with the patient today.  Concerns regarding medicines are outlined above.  Orders Placed This Encounter  Procedures  . Protime-INR  . CBC  . Basic metabolic panel   Meds ordered this encounter  Medications  . torsemide (DEMADEX) 20 MG tablet    Sig: Take 1 tablet (20 mg total) by mouth daily.    Dispense:  180 tablet    Refill:  3  . warfarin (COUMADIN) 5 MG tablet    Sig: Take 1 tablet (5 mg total) by mouth daily.    Dispense:  90 tablet    Refill:  3  . metoprolol tartrate (LOPRESSOR) 100 MG tablet    Sig: Take 0.5 tablets (50 mg total) by mouth 2 (two) times daily.    Dispense:  60 tablet    Refill:  3    Patient Instructions  Medication Instructions:  STOP ELIQUIS  START COUMADIN 5 mg (until you see PHARMD)  DECREASE TORSEMIDE 20 mg daily   *If you need a refill on your cardiac medications before your next appointment, please call your pharmacy*   Lab Work: CBC, BMET, PT/INR 3 days before CARDIOVERSION  COVID TESTING: OCT 18th at 10:00 ([address], [place]  [place]) If you have labs (blood work) drawn today and your tests are completely normal, you will receive your results only by: . MyChart Message (if you have MyChart) OR . A paper copy in the mail If you have any lab test that is abnormal or we need to change your treatment, we will call you to review the results.   Testing/Procedures:  Your physician has requested that you have a TEE/Cardioversion. During a TEE, sound waves are used to create images of your heart. It provides your doctor with information about the size and shape of your heart and how well your heart's chambers and valves are working. In this test, a transducer is attached to the end of a flexible tube that is guided down you throat and into your esophagus (the tube leading from your mouth to your stomach) to get a more detailed image of your heart. Once   the TEE has determined that a blood clot is not present, the cardioversion begins. Electrical Cardioversion uses a jolt of electricity to your heart either through paddles or wired patches attached to your chest. This is a controlled, usually prescheduled, procedure. This procedure is done at the hospital and you are not awake during the procedure. You usually go home the day of the procedure. Please see the instruction sheet given to you today for more information.     Follow-Up: At [facility], you and your health needs are our priority.  As part of our continuing mission to provide you with exceptional heart care, we have created designated Provider Care Teams.  These Care Teams include your primary Cardiologist (physician) and Advanced Practice Providers (APPs -  Physician Assistants and Nurse Practitioners) who all work together to provide you with the care you need, when you need it.  We recommend signing up for the patient portal called "MyChart".  Sign up information is provided on this After Visit Summary.  MyChart is used to connect with patients for Virtual Visits  (Telemedicine).  Patients are able to view lab/test results, encounter notes, upcoming appointments, etc.  Non-urgent messages can be sent to your provider as well.   To learn more about what you can do with MyChart, go to [email or web address].    Your next appointment:   1 week(s) post CARDIOVERSION  The format for your next appointment:   In Person  Provider:   [name], MD    COUMADIN APPT 09/27 @ 10:30 AM   Other Instructions  You are scheduled for a TEE/Cardioversion/TEE Cardioversion on October 20th with Dr. Audie Box.  Please arrive at the [facility] (Main Entrance A) at [facility]: [place], [place] [address] at 8:30 am. (1 hour prior to procedure unless lab work is needed; if lab work is needed arrive 1.5 hours ahead)  DIET: Nothing to eat or drink after midnight except a sip of water with medications (see medication instructions below)  Medication Instructions:  Continue your anticoagulant: Coumadin  You will need to continue your anticoagulant after your procedure until you  are told by your  Provider that it is safe to stop   Labs: PT/INR, CBC, BMET 3 days before CARDIOVERSION  You must have a responsible person to drive you home and stay in the waiting area during your procedure. Failure to do so could result in cancellation.  Bring your insurance cards.  *Special Note: Every effort is made to have your procedure done on time. Occasionally there are emergencies that occur at the hospital that may cause delays. Please be patient if a delay does occur.       Time Spent with Patient: I have spent a total of 35 minutes with patient reviewing hospital notes, telemetry, EKGs, labs and examining the patient as well as establishing an assessment and plan that was discussed with the patient.  > 50% of time was spent in direct patient care.  Signed, Addison Naegeli. Audie Box, [place]  [address], [place], [place] [address] [phone]  07/10/2020 9:59 AM

## 2020-07-10 ENCOUNTER — Encounter: Payer: Self-pay | Admitting: Cardiovascular Disease

## 2020-07-10 ENCOUNTER — Other Ambulatory Visit: Payer: Self-pay

## 2020-07-10 ENCOUNTER — Ambulatory Visit: Payer: Medicare Other | Admitting: Cardiovascular Disease

## 2020-07-10 VITALS — BP 100/52 | HR 108 | Ht 61.0 in | Wt 208.0 lb

## 2020-07-10 DIAGNOSIS — I10 Essential (primary) hypertension: Secondary | ICD-10-CM

## 2020-07-10 DIAGNOSIS — I5032 Chronic diastolic (congestive) heart failure: Secondary | ICD-10-CM | POA: Diagnosis not present

## 2020-07-10 DIAGNOSIS — I4819 Other persistent atrial fibrillation: Secondary | ICD-10-CM

## 2020-07-10 DIAGNOSIS — I43 Cardiomyopathy in diseases classified elsewhere: Secondary | ICD-10-CM

## 2020-07-10 DIAGNOSIS — E854 Organ-limited amyloidosis: Secondary | ICD-10-CM

## 2020-07-10 DIAGNOSIS — Z7901 Long term (current) use of anticoagulants: Secondary | ICD-10-CM

## 2020-07-10 DIAGNOSIS — I513 Intracardiac thrombosis, not elsewhere classified: Secondary | ICD-10-CM

## 2020-07-10 DIAGNOSIS — I4891 Unspecified atrial fibrillation: Secondary | ICD-10-CM

## 2020-07-10 MED ORDER — TORSEMIDE 20 MG PO TABS: 20.0000 mg | ORAL_TABLET | Freq: Every day | ORAL | 3 refills | Status: DC

## 2020-07-10 MED ORDER — METOPROLOL TARTRATE 100 MG PO TABS: 50.0000 mg | ORAL_TABLET | Freq: Two times a day (BID) | ORAL | 3 refills | Status: DC

## 2020-07-10 MED ORDER — WARFARIN SODIUM 5 MG PO TABS: 5.0000 mg | ORAL_TABLET | Freq: Every day | ORAL | 3 refills | Status: DC

## 2020-07-10 NOTE — Patient Instructions (Signed)
Medication Instructions:  STOP ELIQUIS  START COUMADIN 5 mg (until you see PHARMD)  DECREASE TORSEMIDE 20 mg daily   *If you need a refill on your cardiac medications before your next appointment, please call your pharmacy*   Lab Work: CBC, BMET, PT/INR 3 days before CARDIOVERSION  COVID TESTING: OCT 18th at 10:00 ([place], [place]) If you have labs (blood work) drawn today and your tests are completely normal, you will receive your results only by: Marland Kitchen MyChart Message (if you have MyChart) OR . A paper copy in the mail If you have any lab test that is abnormal or we need to change your treatment, we will call you to review the results.   Testing/Procedures:  Your physician has requested that you have a TEE/Cardioversion. During a TEE, sound waves are used to create images of your heart. It provides your doctor with information about the size and shape of your heart and how well your heart's chambers and valves are working. In this test, a transducer is attached to the end of a flexible tube that is guided down you throat and into your esophagus (the tube leading from your mouth to your stomach) to get a more detailed image of your heart. Once the TEE has determined that a blood clot is not present, the cardioversion begins. Electrical Cardioversion uses a jolt of electricity to your heart either through paddles or wired patches attached to your chest. This is a controlled, usually prescheduled, procedure. This procedure is done at the hospital and you are not awake during the procedure. You usually go home the day of the procedure. Please see the instruction sheet given to you today for more information.     Follow-Up: At [facility], you and your health needs are our priority.  As part of our continuing mission to provide you with exceptional heart care, we have created designated Provider Care Teams.  These Care Teams include your primary Cardiologist (physician) and  Advanced Practice Providers (APPs -  Physician Assistants and Nurse Practitioners) who all work together to provide you with the care you need, when you need it.  We recommend signing up for the patient portal called "MyChart".  Sign up information is provided on this After Visit Summary.  MyChart is used to connect with patients for Virtual Visits (Telemedicine).  Patients are able to view lab/test results, encounter notes, upcoming appointments, etc.  Non-urgent messages can be sent to your provider as well.   To learn more about what you can do with MyChart, go to [email or web address].    Your next appointment:   1 week(s) post CARDIOVERSION  The format for your next appointment:   In Person  Provider:   [name], MD    COUMADIN APPT 09/27 @ 10:30 AM   Other Instructions  You are scheduled for a TEE/Cardioversion/TEE Cardioversion on October 20th with Dr. Audie Box.  Please arrive at the [facility] (Main Entrance A) at [facility]: [place], [place] [address] at 8:30 am. (1 hour prior to procedure unless lab work is needed; if lab work is needed arrive 1.5 hours ahead)  DIET: Nothing to eat or drink after midnight except a sip of water with medications (see medication instructions below)  Medication Instructions:  Continue your anticoagulant: Coumadin  You will need to continue your anticoagulant after your procedure until you  are told by your  Provider that it is safe to stop   Labs: PT/INR, CBC,  BMET 3 days before CARDIOVERSION  You must have a responsible person to drive you home and stay in the waiting area during your procedure. Failure to do so could result in cancellation.  Bring your insurance cards.  *Special Note: Every effort is made to have your procedure done on time. Occasionally there are emergencies that occur at the hospital that may cause delays. Please be patient if a delay does occur.

## 2020-07-13 ENCOUNTER — Other Ambulatory Visit: Payer: Self-pay

## 2020-07-13 ENCOUNTER — Ambulatory Visit (INDEPENDENT_AMBULATORY_CARE_PROVIDER_SITE_OTHER): Payer: Medicare Other

## 2020-07-13 DIAGNOSIS — I4819 Other persistent atrial fibrillation: Secondary | ICD-10-CM | POA: Diagnosis not present

## 2020-07-13 DIAGNOSIS — Z7901 Long term (current) use of anticoagulants: Secondary | ICD-10-CM | POA: Diagnosis not present

## 2020-07-13 LAB — POCT INR: INR: 1.7 — AB (ref 2.0–3.0)

## 2020-07-13 NOTE — Patient Instructions (Signed)
Take 1 tablet daily except 1/2 tablet on Wednesday and Friday. INR check 1 week.   A full discussion of the nature of anticoagulants has been carried out.  A benefit risk analysis has been presented to the patient, so that they understand the justification for choosing anticoagulation at this time. The need for frequent and regular monitoring, precise dosage adjustment and compliance is stressed.  Side effects of potential bleeding are discussed.  The patient should avoid any OTC items containing aspirin or ibuprofen, and should avoid great swings in general diet.  Avoid alcohol consumption.  Call if any signs of abnormal bleeding.  [phone]

## 2020-07-14 ENCOUNTER — Telehealth: Payer: Self-pay | Admitting: Cardiovascular Disease

## 2020-07-14 NOTE — Telephone Encounter (Signed)
The patient is concerned because she gained 2 pounds overnight. Yesterday, she weighed 208 lbs. Today she is 210 lbs. She doesn't have "lots of obvious swelling," but her stomach feels a little bloated and her ankles are "ever so slightly" puffy.  She has been strictly limiting her salt and tries to eat fresh foods. Today she ate oatmeal for breakfast and fresh veggies and hamburger meat for lunch with no salt added.  She tries to elevate her legs as much as possible. She denies increased SOB.  She will continue to limit salt intake, elevate her legs, and monitor symptoms.  Her torsemide was decreased at recent visit. She would like to know if she can have medication instructions to take extra if her weight increases.  To Dr. Audie Box for recommendations.

## 2020-07-14 NOTE — Telephone Encounter (Signed)
     Pt c/o swelling: STAT is pt has developed SOB within 24 hours  1) How much weight have you gained and in what time span? Pt said she gained 2 lbs overnight  2) If swelling, where is the swelling located? Legs and ankles   3) Are you currently taking a fluid pill? Yes  4) Are you currently SOB? When moving around but [t say that's normal  5) Do you have a log of your daily weights (if so, list)? yesterday she weight 208, today she's at 210  6) Have you gained 3 pounds in a day or 5 pounds in a week? 2 lbs in a day  7) Have you traveled recently? No  Pt said Dr. Audie Box decreased her fluid pill to 1 tablet a day. She said she felt like she have fluid build up and her ankles looks swollen and gained 2 lbs in 24 hours

## 2020-07-14 NOTE — Telephone Encounter (Signed)
She may take an extra tablet in the afternoon as needed.  [name], [place]  [address], [place],  [address] [phone]  6:07 PM

## 2020-07-15 ENCOUNTER — Telehealth: Payer: Self-pay | Admitting: Family Medicine

## 2020-07-15 MED ORDER — ALLOPURINOL 100 MG PO TABS
100.0000 mg | ORAL_TABLET | Freq: Every day | ORAL | 3 refills | Status: DC
Start: 2020-07-15 — End: 2020-12-09

## 2020-07-15 NOTE — Telephone Encounter (Signed)
Pt is calling in stating that she needs to have a new Rx for allopurinol (ZYLOPRIM) 100 MG pt state that she has been out for several days.   Pharm:  [facility] on [address] in [place]

## 2020-07-15 NOTE — Telephone Encounter (Signed)
Rx has been sent in. 

## 2020-07-15 NOTE — Telephone Encounter (Signed)
The patient states her weight is back down to 208 lbs this AM. Informed the patient she may take an extra torsemide tablet in PM if needed for weight gain of 3 pounds overnight/swelling. She was grateful for assistance.

## 2020-07-17 DIAGNOSIS — N184 Chronic kidney disease, stage 4 (severe): Secondary | ICD-10-CM | POA: Diagnosis not present

## 2020-07-17 DIAGNOSIS — I5042 Chronic combined systolic (congestive) and diastolic (congestive) heart failure: Secondary | ICD-10-CM | POA: Diagnosis not present

## 2020-07-17 DIAGNOSIS — I4891 Unspecified atrial fibrillation: Secondary | ICD-10-CM | POA: Diagnosis not present

## 2020-07-17 DIAGNOSIS — I2510 Atherosclerotic heart disease of native coronary artery without angina pectoris: Secondary | ICD-10-CM | POA: Diagnosis not present

## 2020-07-17 DIAGNOSIS — I43 Cardiomyopathy in diseases classified elsewhere: Secondary | ICD-10-CM | POA: Diagnosis not present

## 2020-07-17 DIAGNOSIS — E854 Organ-limited amyloidosis: Secondary | ICD-10-CM | POA: Diagnosis not present

## 2020-07-20 ENCOUNTER — Telehealth: Payer: Self-pay | Admitting: Cardiovascular Disease

## 2020-07-20 ENCOUNTER — Other Ambulatory Visit: Payer: Self-pay

## 2020-07-20 ENCOUNTER — Ambulatory Visit (INDEPENDENT_AMBULATORY_CARE_PROVIDER_SITE_OTHER): Payer: Medicare Other

## 2020-07-20 DIAGNOSIS — I4819 Other persistent atrial fibrillation: Secondary | ICD-10-CM

## 2020-07-20 DIAGNOSIS — Z7901 Long term (current) use of anticoagulants: Secondary | ICD-10-CM

## 2020-07-20 LAB — POCT INR: INR: 5.4 — AB (ref 2.0–3.0)

## 2020-07-20 NOTE — Telephone Encounter (Signed)
Returned call to patient of Dr. Audie Box. She is calling with c/o weight gain + swelling. Similar call on 9/28 at which time she was advised to take extra fluid pill for weight gain/swelling. She reports she has not yet done this and has not yet taken her medications as she just got up, went to bathroom, and weighed herself. Advised she can take extra fluid pill today to see if her weight comes down and if this helps swelling. She voiced understanding.   208.7 lbs last week  210lbs yesterday  211 lbs today * she has NOT had a weight gain of 3lbs in 24hr or 5lbs in 1 week

## 2020-07-20 NOTE — Telephone Encounter (Signed)
Pt c/o swelling: STAT is pt has developed SOB within 24 hours  1) How much weight have you gained and in what time span? 3 lbs in a week  2) If swelling, where is the swelling located? ankles  3) Are you currently taking a fluid pill? yes  4) Are you currently SOB? no  5) Do you have a log of your daily weights (if so, list)? 211 today, last time 208 last week  6) Have you gained 3 pounds in a day or 5 pounds in a week? no  7) Have you traveled recently? no   Patient states she can feel that she has gained weight and has some swelling in her ankles. She states she is now 211 lbs.

## 2020-07-20 NOTE — Patient Instructions (Signed)
Hold today and tomorrow and Wednesday and then decrease to  1 tablet daily except 1/2 tablet on Monday, Wednesday and Friday. INR check 1 week.

## 2020-07-27 ENCOUNTER — Other Ambulatory Visit: Payer: Self-pay

## 2020-07-27 ENCOUNTER — Ambulatory Visit (INDEPENDENT_AMBULATORY_CARE_PROVIDER_SITE_OTHER): Payer: Medicare Other

## 2020-07-27 DIAGNOSIS — I4819 Other persistent atrial fibrillation: Secondary | ICD-10-CM

## 2020-07-27 DIAGNOSIS — Z7901 Long term (current) use of anticoagulants: Secondary | ICD-10-CM | POA: Diagnosis not present

## 2020-07-27 LAB — POCT INR: INR: 3.4 — AB (ref 2.0–3.0)

## 2020-07-27 NOTE — Patient Instructions (Signed)
Hold tonight and then continue taking 1 tablet daily except 1/2 tablet on Monday, Wednesday and Friday. INR check 1 week. No Greens.

## 2020-07-28 ENCOUNTER — Telehealth: Payer: Self-pay

## 2020-07-28 ENCOUNTER — Telehealth: Payer: Self-pay | Admitting: Family Medicine

## 2020-07-28 DIAGNOSIS — E1140 Type 2 diabetes mellitus with diabetic neuropathy, unspecified: Secondary | ICD-10-CM

## 2020-07-28 NOTE — Telephone Encounter (Signed)
Patient is requesting a referal to an Endocrinologist, and a call back to let her know what she needs to do.

## 2020-07-28 NOTE — Telephone Encounter (Signed)
Called and lmomed the pt stated that they only need to verbally state how much social security check is monthly and that the [employer] people would do the rest

## 2020-07-28 NOTE — Telephone Encounter (Signed)
Okay to place referral

## 2020-07-31 NOTE — Telephone Encounter (Signed)
I placed referral, patient is aware that they will call her.

## 2020-07-31 NOTE — Telephone Encounter (Signed)
It is okay to place referral to endocrinologist as requested. Thanks, BJ

## 2020-08-03 ENCOUNTER — Other Ambulatory Visit (INDEPENDENT_AMBULATORY_CARE_PROVIDER_SITE_OTHER): Payer: Medicare Other

## 2020-08-03 ENCOUNTER — Ambulatory Visit (INDEPENDENT_AMBULATORY_CARE_PROVIDER_SITE_OTHER): Payer: Medicare Other

## 2020-08-03 ENCOUNTER — Other Ambulatory Visit (HOSPITAL_COMMUNITY)
Admission: RE | Admit: 2020-08-03 | Discharge: 2020-08-03 | Disposition: A | Discharge status: Home / Self Care | Payer: Medicare Other | Source: Ambulatory Visit | Attending: Cardiovascular Disease | Admitting: Cardiovascular Disease

## 2020-08-03 ENCOUNTER — Telehealth: Payer: Self-pay | Admitting: Cardiovascular Disease

## 2020-08-03 DIAGNOSIS — Z7901 Long term (current) use of anticoagulants: Secondary | ICD-10-CM

## 2020-08-03 DIAGNOSIS — I4819 Other persistent atrial fibrillation: Secondary | ICD-10-CM | POA: Diagnosis not present

## 2020-08-03 DIAGNOSIS — I4811 Longstanding persistent atrial fibrillation: Secondary | ICD-10-CM | POA: Diagnosis not present

## 2020-08-03 DIAGNOSIS — Z01818 Encounter for other preprocedural examination: Secondary | ICD-10-CM | POA: Insufficient documentation

## 2020-08-03 DIAGNOSIS — Z20822 Contact with and (suspected) exposure to covid-19: Secondary | ICD-10-CM | POA: Insufficient documentation

## 2020-08-03 LAB — BASIC METABOLIC PANEL
BUN/Creatinine Ratio: 28 (ref 12–28)
BUN: 52 mg/dL — ABNORMAL HIGH (ref 8–27)
CO2: 33 mmol/L — ABNORMAL HIGH (ref 20–29)
Calcium: 9.8 mg/dL (ref 8.7–10.3)
Chloride: 95 mmol/L — ABNORMAL LOW (ref 96–106)
Creatinine, Ser: 1.87 mg/dL — ABNORMAL HIGH (ref 0.57–1.00)
GFR calc Af Amer: 30 mL/min/{1.73_m2} — ABNORMAL LOW (ref >59)
GFR calc non Af Amer: 26 mL/min/{1.73_m2} — ABNORMAL LOW (ref >59)
Glucose: 166 mg/dL — ABNORMAL HIGH (ref 65–99)
Potassium: 4.3 mmol/L (ref 3.5–5.2)
Sodium: 140 mmol/L (ref 134–144)

## 2020-08-03 LAB — CBC
Hematocrit: 36.8 % (ref 34.0–46.6)
Hemoglobin: 12.1 g/dL (ref 11.1–15.9)
MCH: 28.4 pg (ref 26.6–33.0)
MCHC: 32.9 g/dL (ref 31.5–35.7)
MCV: 86 fL (ref 79–97)
Platelets: 362 10*3/uL (ref 150–450)
RBC: 4.26 x10E6/uL (ref 3.77–5.28)
RDW: 13.1 % (ref 11.7–15.4)
WBC: 7.1 10*3/uL (ref 3.4–10.8)

## 2020-08-03 LAB — SARS CORONAVIRUS 2 (TAT 6-24 HRS): SARS Coronavirus 2: NEGATIVE

## 2020-08-03 LAB — POCT INR: INR: 5.3 — AB (ref 2.0–3.0)

## 2020-08-03 NOTE — Telephone Encounter (Signed)
Will forward this message to [facility] to further follow-up with the pt, about concerns with eating a PB&J sandwich and taking coumadin.

## 2020-08-03 NOTE — Patient Instructions (Signed)
Hold tonight and then continue taking 1 tablet daily except 1/2 tablet on Monday, Wednesday and Friday. INR check 1 week.

## 2020-08-03 NOTE — Telephone Encounter (Signed)
Patient states she made a peanut butter and grape jelly sandwich and she would like to ensure that this will not interfere with INR. Please advise.

## 2020-08-03 NOTE — Telephone Encounter (Signed)
Called and spoke w/pt and stated that they are fine to eat pb&J sandwhiches as long as its not cranberry jelly b/c it raises inr levels. The pt voiced understanding

## 2020-08-05 ENCOUNTER — Ambulatory Visit (HOSPITAL_COMMUNITY): Payer: Medicare Other | Admitting: Anesthesiology

## 2020-08-05 ENCOUNTER — Encounter (HOSPITAL_COMMUNITY)
Admission: RE | Disposition: A | Discharge status: Home / Self Care | Payer: Self-pay | Source: Home / Self Care | Attending: Cardiovascular Disease

## 2020-08-05 ENCOUNTER — Ambulatory Visit (HOSPITAL_COMMUNITY)
Admission: RE | Admit: 2020-08-05 | Discharge: 2020-08-05 | Disposition: A | Discharge status: Home / Self Care | Payer: Medicare Other | Attending: Cardiovascular Disease | Admitting: Cardiovascular Disease

## 2020-08-05 ENCOUNTER — Encounter (HOSPITAL_COMMUNITY): Payer: Self-pay | Admitting: Cardiovascular Disease

## 2020-08-05 ENCOUNTER — Other Ambulatory Visit: Payer: Self-pay

## 2020-08-05 ENCOUNTER — Ambulatory Visit (HOSPITAL_BASED_OUTPATIENT_CLINIC_OR_DEPARTMENT_OTHER)
Admission: RE | Admit: 2020-08-05 | Discharge: 2020-08-05 | Disposition: A | Discharge status: Home / Self Care | Payer: Medicare Other | Source: Ambulatory Visit | Attending: Cardiovascular Disease | Admitting: Cardiovascular Disease

## 2020-08-05 DIAGNOSIS — Z885 Allergy status to narcotic agent status: Secondary | ICD-10-CM | POA: Diagnosis not present

## 2020-08-05 DIAGNOSIS — E1122 Type 2 diabetes mellitus with diabetic chronic kidney disease: Secondary | ICD-10-CM | POA: Insufficient documentation

## 2020-08-05 DIAGNOSIS — Z8249 Family history of ischemic heart disease and other diseases of the circulatory system: Secondary | ICD-10-CM | POA: Insufficient documentation

## 2020-08-05 DIAGNOSIS — E782 Mixed hyperlipidemia: Secondary | ICD-10-CM | POA: Diagnosis not present

## 2020-08-05 DIAGNOSIS — Z7901 Long term (current) use of anticoagulants: Secondary | ICD-10-CM | POA: Insufficient documentation

## 2020-08-05 DIAGNOSIS — N183 Chronic kidney disease, stage 3 unspecified: Secondary | ICD-10-CM | POA: Insufficient documentation

## 2020-08-05 DIAGNOSIS — G4733 Obstructive sleep apnea (adult) (pediatric): Secondary | ICD-10-CM | POA: Diagnosis not present

## 2020-08-05 DIAGNOSIS — Z8673 Personal history of transient ischemic attack (TIA), and cerebral infarction without residual deficits: Secondary | ICD-10-CM | POA: Diagnosis not present

## 2020-08-05 DIAGNOSIS — I4891 Unspecified atrial fibrillation: Secondary | ICD-10-CM | POA: Diagnosis not present

## 2020-08-05 DIAGNOSIS — Z888 Allergy status to other drugs, medicaments and biological substances status: Secondary | ICD-10-CM | POA: Insufficient documentation

## 2020-08-05 DIAGNOSIS — E854 Organ-limited amyloidosis: Secondary | ICD-10-CM | POA: Diagnosis not present

## 2020-08-05 DIAGNOSIS — Z90710 Acquired absence of both cervix and uterus: Secondary | ICD-10-CM | POA: Diagnosis not present

## 2020-08-05 DIAGNOSIS — Z881 Allergy status to other antibiotic agents status: Secondary | ICD-10-CM | POA: Diagnosis not present

## 2020-08-05 DIAGNOSIS — Z794 Long term (current) use of insulin: Secondary | ICD-10-CM | POA: Insufficient documentation

## 2020-08-05 DIAGNOSIS — Z96653 Presence of artificial knee joint, bilateral: Secondary | ICD-10-CM | POA: Diagnosis not present

## 2020-08-05 DIAGNOSIS — K219 Gastro-esophageal reflux disease without esophagitis: Secondary | ICD-10-CM | POA: Insufficient documentation

## 2020-08-05 DIAGNOSIS — Z79899 Other long term (current) drug therapy: Secondary | ICD-10-CM | POA: Diagnosis not present

## 2020-08-05 DIAGNOSIS — E890 Postprocedural hypothyroidism: Secondary | ICD-10-CM | POA: Diagnosis not present

## 2020-08-05 DIAGNOSIS — I081 Rheumatic disorders of both mitral and tricuspid valves: Secondary | ICD-10-CM | POA: Diagnosis not present

## 2020-08-05 DIAGNOSIS — I2510 Atherosclerotic heart disease of native coronary artery without angina pectoris: Secondary | ICD-10-CM | POA: Insufficient documentation

## 2020-08-05 DIAGNOSIS — I4819 Other persistent atrial fibrillation: Secondary | ICD-10-CM | POA: Insufficient documentation

## 2020-08-05 DIAGNOSIS — E785 Hyperlipidemia, unspecified: Secondary | ICD-10-CM | POA: Diagnosis not present

## 2020-08-05 DIAGNOSIS — Z79890 Hormone replacement therapy (postmenopausal): Secondary | ICD-10-CM | POA: Insufficient documentation

## 2020-08-05 DIAGNOSIS — I5042 Chronic combined systolic (congestive) and diastolic (congestive) heart failure: Secondary | ICD-10-CM | POA: Insufficient documentation

## 2020-08-05 DIAGNOSIS — I313 Pericardial effusion (noninflammatory): Secondary | ICD-10-CM | POA: Diagnosis not present

## 2020-08-05 DIAGNOSIS — I130 Hypertensive heart and chronic kidney disease with heart failure and stage 1 through stage 4 chronic kidney disease, or unspecified chronic kidney disease: Secondary | ICD-10-CM | POA: Insufficient documentation

## 2020-08-05 DIAGNOSIS — I513 Intracardiac thrombosis, not elsewhere classified: Secondary | ICD-10-CM | POA: Insufficient documentation

## 2020-08-05 HISTORY — PX: TEE WITHOUT CARDIOVERSION: SHX5443

## 2020-08-05 HISTORY — PX: BUBBLE STUDY: SHX6837

## 2020-08-05 LAB — PROTIME-INR
INR: 3.4 — ABNORMAL HIGH (ref 0.8–1.2)
Prothrombin Time: 33.6 seconds — ABNORMAL HIGH (ref 11.4–15.2)

## 2020-08-05 LAB — GLUCOSE, CAPILLARY: Glucose-Capillary: 87 mg/dL (ref 70–99)

## 2020-08-05 SURGERY — ECHOCARDIOGRAM, TRANSESOPHAGEAL
Anesthesia: Monitor Anesthesia Care

## 2020-08-05 MED ORDER — PERFLUTREN LIPID MICROSPHERE
1.0000 mL | INTRAVENOUS | Status: DC | PRN
  Administered 2020-08-05: 5 mL via INTRAVENOUS
  Filled 2020-08-05: qty 10

## 2020-08-05 MED ORDER — PHENYLEPHRINE 40 MCG/ML (10ML) SYRINGE FOR IV PUSH (FOR BLOOD PRESSURE SUPPORT)
PREFILLED_SYRINGE | INTRAVENOUS | Status: DC | PRN
  Administered 2020-08-05: 40 ug via INTRAVENOUS

## 2020-08-05 MED ORDER — PROPOFOL 500 MG/50ML IV EMUL
INTRAVENOUS | Status: DC | PRN
  Administered 2020-08-05: 50 ug/kg/min via INTRAVENOUS

## 2020-08-05 MED ORDER — PROPOFOL 10 MG/ML IV BOLUS
INTRAVENOUS | Status: DC | PRN
  Administered 2020-08-05 (×2): 10 mg via INTRAVENOUS

## 2020-08-05 MED ORDER — ONDANSETRON HCL 4 MG/2ML IJ SOLN
INTRAMUSCULAR | Status: DC | PRN
  Administered 2020-08-05: 4 mg via INTRAVENOUS

## 2020-08-05 MED ORDER — LACTATED RINGERS IV SOLN: INTRAVENOUS | Status: DC

## 2020-08-05 MED ORDER — PERFLUTREN LIPID MICROSPHERE
INTRAVENOUS | Status: AC
  Filled 2020-08-05: qty 10

## 2020-08-05 MED ORDER — SODIUM CHLORIDE 0.9 % IV SOLN: INTRAVENOUS | Status: DC

## 2020-08-05 MED ORDER — BUTAMBEN-TETRACAINE-BENZOCAINE 2-2-14 % EX AERO
INHALATION_SPRAY | CUTANEOUS | Status: DC | PRN
  Administered 2020-08-05: 2 via TOPICAL

## 2020-08-05 MED ORDER — LIDOCAINE 2% (20 MG/ML) 5 ML SYRINGE
INTRAMUSCULAR | Status: DC | PRN
  Administered 2020-08-05: 80 mg via INTRAVENOUS

## 2020-08-05 NOTE — Transfer of Care (Addendum)
Immediate Anesthesia Transfer of Care Note  Patient: Regina [name]  Procedure(s) Performed: TRANSESOPHAGEAL ECHOCARDIOGRAM (TEE) (N/A ) BUBBLE STUDY  Patient Location: Endoscopy Unit  Anesthesia Type:MAC  Level of Consciousness: awake, alert  and oriented  Airway & Oxygen Therapy: Patient Spontanous Breathing  Post-op Assessment: Report given to RN and Post -op Vital signs reviewed and stable  Post vital signs: Reviewed and stable  Last Vitals:  Vitals Value Taken Time  BP    Temp    Pulse 80 08/05/20 1023  Resp 24 08/05/20 1023  SpO2 99 % 08/05/20 1023  Vitals shown include unvalidated device data.  Last Pain:  Vitals:   08/05/20 0905  TempSrc: Oral  PainSc: 2          Complications: No complications documented.

## 2020-08-05 NOTE — Discharge Instructions (Signed)
Transesophageal Echocardiogram  Transesophageal echocardiogram (TEE) is a test that uses sound waves (echocardiogram) to produce very clear, detailed images of the heart. TEE is done by passing a flexible tube down the esophagus. The heart is located in front of the esophagus. TEE may be done:  To check how well your heart valves are working.  To check for any abnormal growth or tumor  To look for blood clots  To check for infection of the lining of the heart (endocarditis).  To evaluate the dividing wall (septum) of the heart and check for a hole that did not close after birth (patent foramen ovale or atrial septal defect).  To help diagnose a tear in the wall of the blood vessels (aortic dissection).  To look at the heart shape, size, and function. Any changes can be associated with certain conditions, including heart failure, aneurysm, and coronary heart disease, CAD.  During cardiac valve surgery. This allows the surgeon to assess the valve repair before closing the chest.  During a variety of other cardiac procedures to guide positioning of catheters.  To monitor your heart's response to IV fluids or medicine. TEE is usually not a painful procedure. You may feel the probe press against the back of the throat. The probe does not enter the trachea and does not affect your breathing. Tell a health care provider about:  Any allergies you have.  All medicines you are taking, including vitamins, herbs, eye drops, creams, and over-the-counter medicines.  Any problems you or family members have had with anesthetic medicines.  Any blood disorders you have.  Any surgeries you have had.  Any medical conditions you have.  Any swallowing difficulties.  Whether you have or have had a blockage of the esophagus (esophageal obstruction).  Whether you are pregnant or may be pregnant. What are the risks? Generally, this is a safe procedure. However, problems may occur,  including:  Damage to other structures or organs.  A tear of the esophagus (esophageal rupture).  Irregular heart beat (arrhythmia).  Hoarse voice or difficulty swallowing.  Bleeding (hemorrhage). What happens before the procedure? Staying hydrated Follow instructions from your health care provider about hydration, which may include:  Up to 3 hours before the procedure - you may continue to drink clear liquids, such as water, clear fruit juice, black coffee, and plain tea. Eating and drinking Follow instructions from your health care provider about eating and drinking, which may include:  8 hours before the procedure - stop eating heavy meals or foods such as meat, fried foods, or fatty foods.  6 hours before the procedure - stop eating light meals or foods, such as toast or cereal.  6 hours before the procedure - stop drinking milk or drinks that contain milk.  3 hours before the procedure - stop drinking clear liquids. General instructions  You will need to remove any dentures or dental retainers.  Plan to have someone take you home from the hospital or clinic.  Plan to have a responsible adult care for you for at least 24 hours after you leave the hospital or clinic. This is important.  Ask your health care provider about: ? Changing or stopping your normal medicines. This is important if you take diabetes medicines or blood thinners. ? Taking over-the-counter medicines, vitamins, herbs, and supplements. ? Taking medicines such as aspirin and ibuprofen. These medicines can thin your blood. Do not take these medicines unless your health care provider tells you to take them. What happens during  the procedure?  To reduce your risk of infection: ? Your health care team will wash or sanitize their hands. ? Hair may be removed from the surgical area. ? Your skin will be washed with soap.  An IV will be inserted into one of your veins.  You will be given one or both of the  following: ? A medicine to help you relax (sedative). ? A medicine to be sprayed or gargled in order to numb the back of your throat (local anesthetic).  Your blood pressure, heart rate, and breathing (vital signs) will be monitored during the procedure.  You may be asked to lay on your left side.  A bite block will be placed in your mouth to keep you from biting the tube during the procedure.  The tip of the TEE probe will be placed into the back of your mouth. You will be asked to swallow. This helps to pass the tip of the probe into the esophagus.  Once the tip of the probe is in the correct area, your health care provider will take pictures of the heart.  The probe and bite block will be removed when the procedure is done. The procedure may vary among health care providers and hospitals What happens after the procedure?  Your blood pressure, heart rate, breathing rate, and blood oxygen level will be monitored until the medicines you were given have worn off.  When you first wake up, your throat may feel slightly sore and will probably still feel numb. This will improve slowly over time. You will not be allowed to eat or drink until the numbness has gone away.  Do not drive for 24 hours if you received a sedative. Summary  Transesophageal echocardiogram (TEE) is a test that uses sound waves (echocardiogram) to produce very clear, detailed images of the heart.  TEE is done by passing a flexible tube down the esophagus.  Generally, this is a safe procedure. However, problems may occur, including damage to other structures or organs, bleeding, irregular heart beat, and a hoarse voice or trouble swallowing.  Tell your health care provider about any medicines and medical conditions you may have, as some conditions may increase your risk of complications. This information is not intended to replace advice given to you by your health care provider. Make sure you discuss any questions you  have with your health care provider. Document Revised: 03/14/2017 Document Reviewed: 12/30/2016 Elsevier Patient Education  [place].

## 2020-08-05 NOTE — Anesthesia Procedure Notes (Signed)
Procedure Name: MAC Date/Time: 08/05/2020 9:45 AM Performed by: Dorthea Cove, CRNA Pre-anesthesia Checklist: Patient identified, Emergency Drugs available, Suction available, Patient being monitored and Timeout performed Patient Re-evaluated:Patient Re-evaluated prior to induction Oxygen Delivery Method: Nasal cannula Preoxygenation: Pre-oxygenation with 100% oxygen Induction Type: IV induction Placement Confirmation: positive ETCO2

## 2020-08-05 NOTE — Progress Notes (Signed)
  Echocardiogram Echocardiogram Transesophageal has been performed.  Regina [name] 08/05/2020, 10:23 AM

## 2020-08-05 NOTE — CV Procedure (Signed)
    TRANSESOPHAGEAL ECHOCARDIOGRAM   NAME:  Regina [name]    MRN: [id number] DOB:  [birth year]    ADMIT DATE: 08/05/2020  INDICATIONS: Atrial fibrillation   PROCEDURE:   Informed consent was obtained prior to the procedure. The risks, benefits and alternatives for the procedure were discussed and the patient comprehended these risks.  Risks include, but are not limited to, cough, sore throat, vomiting, nausea, somnolence, esophageal and stomach trauma or perforation, bleeding, low blood pressure, aspiration, pneumonia, infection, trauma to the teeth and death.    Procedural time out performed. The oropharynx was anesthetized with topical 1% benzocaine.    Anesthesia was administered by Dr. Linna Caprice.  The patient was administered a total of 137 mcg propofol and 80 mg lidocaine to achieve and maintain moderate conscious sedation.  The patient's heart rate, blood pressure, and oxygen saturation are monitored continuously during the procedure. The period of conscious sedation is 20 minutes, of which I was present face-to-face 100% of this time.   The transesophageal probe was inserted in the esophagus and stomach without difficulty and multiple views were obtained.   COMPLICATIONS:    There were no immediate complications.  KEY FINDINGS:  1. LAA sludge. No DCCV performed.  2. LVEF ~15%. 3. Severe RF dysfunction.  4. Full report to follow. 5. Further management per primary team.   Addison Naegeli. Audie Box, [place]  [address] [place], [place] [address] [phone]  10:14 AM

## 2020-08-05 NOTE — Interval H&P Note (Signed)
History and Physical Interval Note:  08/05/2020 9:03 AM  Regina [name]  has presented today for surgery, with the diagnosis of AFIB.  The various methods of treatment have been discussed with the patient and family. After consideration of risks, benefits and other options for treatment, the patient has consented to  Procedure(s): CARDIOVERSION (N/A) TRANSESOPHAGEAL ECHOCARDIOGRAM (TEE) (N/A) as a surgical intervention.  The patient's history has been reviewed, patient examined, no change in status, stable for surgery.  I have reviewed the patient's chart and labs.  Questions were answered to the patient's satisfaction.    TEE/DCCV for atrial fibrillation. EF 15%. 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 On warfarin now. NPO.   [name], [place]  [address], [place], [place] [address] [phone]  9:04 AM

## 2020-08-05 NOTE — Anesthesia Preprocedure Evaluation (Signed)
Anesthesia Evaluation  Patient identified by MRN, date of birth, ID band Patient awake    Reviewed: Allergy & Precautions, NPO status , Patient's Chart, lab work & pertinent test results  Airway Mallampati: II  TM Distance: >3 FB Neck ROM: Full    Dental  (+) Teeth Intact   Pulmonary     + decreased breath sounds      Cardiovascular hypertension,  Rhythm:Irregular Rate:Normal     Neuro/Psych    GI/Hepatic   Endo/Other  diabetes  Renal/GU      Musculoskeletal   Abdominal   Peds  Hematology   Anesthesia Other Findings   Reproductive/Obstetrics                             Anesthesia Physical Anesthesia Plan  ASA: III  Anesthesia Plan: MAC   Post-op Pain Management:    Induction: Intravenous  PONV Risk Score and Plan: Ondansetron  Airway Management Planned: Natural Airway and Nasal Cannula  Additional Equipment:   Intra-op Plan:   Post-operative Plan:   Informed Consent: I have reviewed the patients History and Physical, chart, labs and discussed the procedure including the risks, benefits and alternatives for the proposed anesthesia with the patient or authorized representative who has indicated his/her understanding and acceptance.       Plan Discussed with: CRNA and Anesthesiologist  Anesthesia Plan Comments:         Anesthesia Quick Evaluation

## 2020-08-05 NOTE — Anesthesia Postprocedure Evaluation (Signed)
Anesthesia Post Note  Patient: Regina [name]  Procedure(s) Performed: TRANSESOPHAGEAL ECHOCARDIOGRAM (TEE) (N/A ) BUBBLE STUDY     Patient location during evaluation: Endoscopy Anesthesia Type: MAC Level of consciousness: awake and alert Pain management: pain level controlled Vital Signs Assessment: post-procedure vital signs reviewed and stable Respiratory status: spontaneous breathing, nonlabored ventilation, respiratory function stable and patient connected to nasal cannula oxygen Cardiovascular status: blood pressure returned to baseline and stable Postop Assessment: no apparent nausea or vomiting Anesthetic complications: no   No complications documented.  Last Vitals:  Vitals:   08/05/20 1032 08/05/20 1042  BP: 113/69 119/61  Pulse: 78 82  Resp: 18 18  Temp:    SpO2: 99% 98%    Last Pain:  Vitals:   08/05/20 1042  TempSrc:   PainSc: 0-No pain                 Tron Flythe COKER

## 2020-08-06 ENCOUNTER — Encounter (HOSPITAL_COMMUNITY): Payer: Self-pay | Admitting: Cardiovascular Disease

## 2020-08-11 ENCOUNTER — Other Ambulatory Visit: Payer: Self-pay | Admitting: Family Medicine

## 2020-08-11 NOTE — Progress Notes (Signed)
Cardiology Office Note:   Date:  08/12/2020  NAME:  Regina [name]    MRN: [id number] DOB:  05-Jun-1941   PCP:  [name], [name], MD  Cardiologist:  Evalina Field, MD   Referring MD: [name], [name], MD   Chief Complaint  Patient presents with  . Follow-up  . Headache  . Shortness of Breath  . Chest Pain   History of Present Illness:   Regina [name] is a 79 y.o. female with a hx of hATTR amyloidosis, persistent Afib with LAA thrombus, DM, non-obstructive CAD, HTN, CKD 3/4 who presents for follow-up. Recent TEE with persistent LAA thrombus. Unable to undergo DCCv.  She reports she is doing well from a volume standpoint.  Weights are stable at 213 pounds.  She is taking torsemide 40 mg a day.  Kidney function is stable.  She does report she gets profoundly short of breath and has some heaviness in her chest.  She reports she feels pressure when she exerts herself.  She did have nonobstructive CAD in 2016.  I did inform her I would like to proceed with a stress test.  I am reluctant to pursue any invasive procedures this could result in kidney failure.  She is not a good candidate for hemodialysis likely.  Other than shortness of breath and chest pain she is without any symptoms.  She reports she does not feel her heart racing.  Her EKG does demonstrate A. fib with RVR with 110 bpm rate.  She reports that her heart rate at home is in the 80s.  This could definitely make her feel short of breath and have chest pain.  We did go over the results of her recent transesophageal echocardiogram which shows persistent thrombus in the left atrial appendage.  She is on Coumadin.  We discussed if we would even want to proceed with repeating this procedure.  I think we should give her a few months of Coumadin and then consider reattempt at a later date.  She is okay with several months of Coumadin for now.  Her INR level was elevated.  Plans to recheck this today.  She has plans to hold her Coumadin per  the Coumadin clinic.  She is been unable to get on tafamidis.  We are still waiting for final approval of her assistance.  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 Persistent Afib -LAA slugde vs thrombus7/20/2021 -persistent sludge 07/01/2020 -LAA thrombus persistent 08/05/2020 -on warfarin 3. DM -A1c 7.6 4. HLD -T chol 98, HDL 44, LDL 30, TG 119 5. HTN 6. Non-obstructive CAD -LHC 01/19/2015 [facility] Becker -50% mid RCA and 50% LAD 7. OSA 8. CKD 3/4  Past Medical History: Past Medical History:  Diagnosis Date  . (HFpEF) heart failure with preserved ejection fraction (Hand)   . Back pain   . 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 Diabetes mellitus without complication ([place])   . Dyspnea   . GERD (gastroesophageal reflux disease)   . H/O echocardiogram    a. 01/2015 Echo: EF 55-60%, Gr 2 DD, mod LVH, mildly dil LA.  Marland Kitchen Heart disease   . Hypertension   . Hypothyroidism   . Joint pain   . Kidney problem   . Lower extremity edema   . Mini stroke ([place])   . Non-obstructive CAD  a. 01/2015 Cardiolite: + inf wall ischemia, EF 56%;  b. 01/2015 Cath: LM nl, LAD 79m LCX min irregs, RCA dominant, 50-636m . Sleep apnea   . Swallowing difficulty   . Thyroid disease     Past Surgical History: Past Surgical History:  Procedure Laterality Date  . ABDOMINAL HYSTERECTOMY    . BUBBLE STUDY  07/01/2020   Procedure: BUBBLE STUDY;  Surgeon: AcElouise MunroeMD;  [facility]: Cardi[facility];;  . BUBBLE STUDY  08/05/2020   Procedure: BUBBLE STUDY;  Surgeon: O'Geralynn RileMD;  [facility]: [facility] Cardiovascular;;  done with definity   . CARDIAC CATHETERIZATION    . ESOPHAGOGASTRODUODENOSCOPY     a. 01/2015 EGD: patent esophagus.  . ESOPHAGOGASTRODUODENOSCOPY N/A 01/20/2015    Procedure: ESOPHAGOGASTRODUODENOSCOPY (EGD);  Surgeon: PaCarol AdaMD;  [facility];  Service: Endoscopy;  Laterality: N/A;  . HAND SURGERY    . JOINT REPLACEMENT     reports history bilateral TKA and right TSA  . LEFT HEART CATHETERIZATION WITH CORONARY ANGIOGRAM N/A 01/19/2015  . TEE WITHOUT CARDIOVERSION  05/04/2020  . TEE WITHOUT CARDIOVERSION N/A 05/05/2020   Procedure: TRANSESOPHAGEAL ECHOCARDIOGRAM (TEE);  Surgeon: HiPixie CasinoMD;  [facility];  Service: Cardiovascular;  Laterality: N/A;  . TEE WITHOUT CARDIOVERSION N/A 07/01/2020   Procedure: TRANSESOPHAGEAL ECHOCARDIOGRAM (TEE);  Surgeon: AcElouise MunroeMD;  [facility];  Service: Cardiovascular;  Laterality: N/A;  . TEE WITHOUT CARDIOVERSION N/A 08/05/2020   Procedure: TRANSESOPHAGEAL ECHOCARDIOGRAM (TEE);  Surgeon: O'Geralynn RileMD;  [facility];  Laterality: N/A;  . THYROIDECTOMY      Current Medications: Current Meds  Medication Sig  . Alcohol Swabs (B-D SINGLE USE SWABS REGULAR) PADS Use to test blood sugar up to 3 times daily  . allopurinol (ZYLOPRIM) 100 MG tablet Take 1 tablet (100 mg total) by mouth daily.  . Ascorbic Acid (VITAMIN C) 1000 MG tablet Take 1,000 mg by mouth daily.  . Marland Kitchentorvastatin (LIPITOR) 20 MG tablet Take 1 tablet (20 mg total) by mouth daily. (Patient taking differently: Take 20 mg by mouth at bedtime. )  . Blood Glucose Monitoring Suppl (ACCU-CHEK GUIDE) w/Device KIT 1 Device by Does not apply route 3 (three) times daily.  . Cholecalciferol (VITAMIN D3) 50 MCG (2000 UT) capsule Take 2,000 Units by mouth daily.  . clotrimazole-betamethasone (LOTRISONE) cream Apply 1 application topically 2 (two) times daily as needed. (Patient taking differently: Apply 1 application topically 2 (two) times daily as needed (rash). )  . cyclobenzaprine (FLEXERIL) 5 MG tablet TAKE 1 TABLET BY MOUTH ONCE DAILY AS NEEDED  . diclofenac  Sodium (VOLTAREN) 1 % GEL Apply 1 application topically daily as needed (arthritis pain/hands).  . famotidine (PEPCID) 20 MG tablet Take 1 tablet (20 mg total) by mouth at bedtime.  . fluticasone (FLONASE) 50 MCG/ACT nasal spray Place 1 spray into both nostrils daily.   . Marland Kitchenabapentin (NEURONTIN) 600 MG tablet Take 1 tablet by mouth twice daily  . glucose blood (ACCU-CHEK GUIDE) test strip Use to test blood sugar up to 3 times daily  . insulin glargine, 1 Unit Dial, (TOUJEO SOLOSTAR) 300 UNIT/ML Solostar Pen Inject 25 Units into the skin daily. Eat a snack with protein nightly before bedtime. (Patient taking differently: Inject 30 Units into the skin daily. )  . Iron-Vitamins (GERITOL COMPLETE PO) Take 1 tablet by mouth daily.  . Marland Kitchenactulose (CHRONULAC) 10 GM/15ML solution Take 20 g by  mouth daily as needed for mild constipation.   . Lancets Misc. (ACCU-CHEK FASTCLIX LANCET) KIT Use to test blood sugar up to 3 times daily  . levothyroxine (SYNTHROID) 150 MCG tablet Take 1 tablet (150 mcg total) by mouth daily before breakfast.  . loratadine (CLARITIN) 10 MG tablet Take 1 tablet (10 mg total) by mouth daily as needed. (Patient taking differently: Take 10 mg by mouth daily as needed for allergies. )  . magnesium oxide (MAG-OX) 400 (241.3 Mg) MG tablet Take 1 tablet by mouth once daily (Patient taking differently: Take 400 mg by mouth daily. )  . meclizine (ANTIVERT) 25 MG tablet Take 25 mg by mouth daily as needed for dizziness.   . metoprolol tartrate (LOPRESSOR) 100 MG tablet Take 0.5 tablets (50 mg total) by mouth 2 (two) times daily.  . nitroGLYCERIN (NITROSTAT) 0.4 MG SL tablet Place 1 tablet (0.4 mg total) under the tongue every 5 (five) minutes as needed for chest pain.  . Omega-3 Fatty Acids (FISH OIL) 1000 MG CAPS Take 2,000 mg by mouth daily.   . pantoprazole (PROTONIX) 40 MG tablet Take 1 tablet (40 mg total) by mouth daily. Take 30 minutes before eating in the morning with 8oz water  .  polyethylene glycol (MIRALAX / GLYCOLAX) 17 g packet Take 17 g by mouth daily as needed for mild constipation.  Marland Kitchen Propylene Glycol (SYSTANE BALANCE OP) Place 1 drop into both eyes daily as needed (for dry eyes).  . Tafamidis 61 MG CAPS Take 61 mg by mouth daily.  Marland Kitchen terazosin (HYTRIN) 5 MG capsule Take 5 mg by mouth at bedtime.  . torsemide (DEMADEX) 20 MG tablet Take 1 tablet (20 mg total) by mouth daily.  Marland Kitchen warfarin (COUMADIN) 5 MG tablet Take 1 tablet (5 mg total) by mouth daily. (Patient taking differently: Take 2.5 mg by mouth See admin instructions. Take 2.5 mg Mon., Wed., Fri. Take 5 mg all the other days at bedtime)     Allergies:    Ace inhibitors, Amlodipine, Atenolol, Avandia [rosiglitazone], Darvon [propoxyphene], Erythromycin, Hydralazine, Hydrocodone, Levofloxacin, Morphine and related, Percocet [oxycodone-acetaminophen], Spironolactone, and Tramadol   Social History: Social History   Socioeconomic History  . Marital status: Widowed    Spouse name: Not on file  . Number of children: Not on file  . Years of education: Not on file  . Highest education level: Not on file  Occupational History  . Occupation: Retired  Tobacco Use  . Smoking status: Never Smoker  . Smokeless tobacco: Never Used  Vaping Use  . Vaping Use: Never used  Substance and Sexual Activity  . Alcohol use: No  . Drug use: Not on file  . Sexual activity: Not Currently  Other Topics Concern  . Not on file  Social History Narrative   Lives with daughter   Social Determinants of Health   Financial Resource Strain:   . Difficulty of Paying Living Expenses: Not on file  Food Insecurity:   . Worried About Charity fundraiser in the Last Year: Not on file  . Ran Out of Food in the Last Year: Not on file  Transportation Needs:   . Lack of Transportation (Medical): Not on file  . Lack of Transportation (Non-Medical): Not on file  Physical Activity:   . Days of Exercise per Week: Not on file  .  Minutes of Exercise per Session: Not on file  Stress:   . Feeling of Stress : Not on file  Social Connections:   .  Frequency of Communication with Friends and Family: Not on file  . Frequency of Social Gatherings with Friends and Family: Not on file  . Attends Religious Services: Not on file  . Active Member of Clubs or Organizations: Not on file  . Attends Archivist Meetings: Not on file  . Marital Status: Not on file     Family History: The patient's family history includes Alcoholism in her father; Cancer in her father; Heart disease in her mother; Hypertension in her mother.  ROS:   All other ROS reviewed and negative. Pertinent positives noted in the HPI.     EKGs/Labs/Other Studies Reviewed:   The following studies were personally reviewed by me today:  EKG:  EKG is ordered today.  The ekg ordered today demonstrates atrial fibrillation with heart rate 110, low voltage noted, and was personally reviewed by me.   TEE 08/05/2020 1. There is sludge/thrombus in the LAA. Contrast imaging also  demonstrated incomplete filling of the LAA. DCCV was not performed. 3d  imaging also demonstrates likely thrombus. Left atrial size was mildly  dilated. A left atrial/left atrial appendage  thrombus was detected. The LAA emptying velocity was 16 cm/s.  2. Severely reduced LV function, EF 10-15%. LVOT VTI 9.6 cm. CO estimated  3.4 L/min and CI 1.8 L/min/m2. Left ventricular ejection fraction, by  estimation, is 10-15%. The left ventricle has severely decreased function.  There is severe concentric left  ventricular hypertrophy.  3. Right ventricular systolic function is severely reduced. The right  ventricular size is mildly enlarged.  4. The mitral valve is grossly normal. Trivial mitral valve  regurgitation. No evidence of mitral stenosis.  5. The aortic valve is tricuspid. Aortic valve regurgitation is not  visualized. No aortic stenosis is present.   PYP 04/16/2020 The  study is abnormal. The study is strongly suggestive of TTR amyloidosis (visual of 2 or 3 or H/CL ratio >1.5).  Although the heart/contralateral ratio is only 1.3, the visual score is 3.  Recent Labs: 04/25/2020: ALT 18; Magnesium 1.9 05/28/2020: BNP 630.4 06/08/2020: TSH 3.24 08/03/2020: BUN 52; Creatinine, Ser 1.87; Hemoglobin 12.1; Platelets 362; Potassium 4.3; Sodium 140   Recent Lipid Panel    Component Value Date/Time   CHOL 117 03/24/2020 1428   TRIG 54 03/24/2020 1428   HDL 56 03/24/2020 1428   CHOLHDL 2 05/22/2019 1631   VLDL 23.8 05/22/2019 1631   LDLCALC 49 03/24/2020 1428    Physical Exam:   VS:  BP 98/62 (BP Location: Left Arm, Patient Position: Sitting, Cuff Size: Large)   Pulse (!) 110   Ht 5' 1"  (1.549 m)   Wt 213 lb (96.6 kg)   BMI 40.25 kg/m    Wt Readings from Last 3 Encounters:  08/12/20 213 lb (96.6 kg)  08/05/20 208 lb (94.3 kg)  07/10/20 208 lb (94.3 kg)    General: Well nourished, well developed, in no acute distress Heart: Atraumatic, normal size  Eyes: PEERLA, EOMI  Neck: Supple, no JVD Endocrine: No thryomegaly Cardiac: Normal S1, S2; RRR; no murmurs, rubs, or gallops Lungs: Clear to auscultation bilaterally, no wheezing, rhonchi or rales  Abd: Soft, nontender, no hepatomegaly  Ext: No edema, pulses 2+ Musculoskeletal: No deformities, BUE and BLE strength normal and equal Skin: Warm and dry, no rashes   Neuro: Alert and oriented to person, place, time, and situation, CNII-XII grossly intact, no focal deficits  Psych: Normal mood and affect   ASSESSMENT:   KAURI GARSON is a  79 y.o. female who presents for the following: 1. Hereditary cardiac amyloidosis ([place])   2. Chronic systolic heart failure (HCC)   3. Persistent atrial fibrillation ([place])   4. Long term (current) use of anticoagulants   5. Thrombus of left atrial appendage   6. Essential hypertension   7. Coronary artery disease involving native coronary artery of native heart  without angina pectoris     PLAN:   1. Hereditary cardiac amyloidosis ([place]) 2. Chronic systolic heart failure (HCC) -Diagnosed with hereditary cardiac amyloidosis.  Positive PYP.  Positive genetic panel.  EF has dropped.  She is in A. fib.  We have been unable to cardiovert her due to persistent left atrial appendage thrombus.  On Coumadin. -Despite this she is euvolemic on exam.  Continue torsemide 40 mg daily. -She will continue Coumadin and rate control. -We will reentertain potential TEE/cardioversion after several months of Coumadin therapy. -Her EF has dropped.  BP is 98/62.  She is on metoprolol.  She is dizzy at times.  I do not think she will tolerate any other guideline directed medical therapy.  We will continue rate control for now. -I have reached out to pharmacy to help with her application for financial assistance for tafamidis.  She apparently has not been able to afford this.  Unclear what the co-pay will ultimately be.  We have given her information for the manufacturer as well.  We also will await if she is a candidate for any clinical trials at [facility].  She has been seen regularly in the cardiac amyloidosis clinic.  3. Persistent atrial fibrillation ([place]) 4. Long term (current) use of anticoagulants 5. Thrombus of left atrial appendage -She remains in A. fib.  We have been unable to cardiovert her due to persistent left atrial appendage thrombus.  We will continue Coumadin for now.  She has had 3 attempts at [place].  They have been unsuccessful due to left atrial appendage thrombus.  We will continue Coumadin for several months and then consider reattempting at a later date.  I would like to get her back in normal sinus rhythm as I think much of her symptoms of shortness of breath and chest pain are related to A. fib.  Unfortunately we cannot do this safely.  We do not want to expose her to any risk of stroke.  She is in agreement with this plan.  6. Essential  hypertension -BPs are soft now.  She has cardiac amyloidosis.  7. Coronary artery disease involving native coronary artery of native heart without angina pectoris -Left heart cath in 2016 with nonobstructive CAD.  She presents with worsening chest pressure with exertion.  We will proceed with Lexiscan nuclear medicine stress test.  We did discuss that invasive cardiac procedures would result in contrast load.  I do not think she be a good candidate for hemodialysis.  We will start with a stress test and not to expose her to any unnecessary invasive procedures.  Disposition: Return in about 3 months (around 11/12/2020).  Medication Adjustments/Labs and Tests Ordered: Current medicines are reviewed at length with the patient today.  Concerns regarding medicines are outlined above.  Orders Placed This Encounter  Procedures  . Myocardial Perfusion Imaging   Meds ordered this encounter  Medications  . torsemide (DEMADEX) 20 MG tablet    Sig: Take 2 tablets ( 40 mg ) daily    Dispense:  180 tablet    Refill:  3    Patient Instructions  Medication Instructions:  Start Torsemide 40 mg daily Continue all other medications *If you need a refill on your cardiac medications before your next appointment, please call your pharmacy*   Lab Work: INR today   Testing/Procedures: Schedule Tax inspector at [employer] office   Follow-Up: At [facility], you and your health needs are our priority.  As part of our continuing mission to provide you with exceptional heart care, we have created designated Provider Care Teams.  These Care Teams include your primary Cardiologist (physician) and Advanced Practice Providers (APPs -  Physician Assistants and Nurse Practitioners) who all work together to provide you with the care you need, when you need it.  We recommend signing up for the patient portal called "MyChart".  Sign up information is provided on this After Visit Summary.  MyChart is used to connect  with patients for Virtual Visits (Telemedicine).  Patients are able to view lab/test results, encounter notes, upcoming appointments, etc.  Non-urgent messages can be sent to your provider as well.   To learn more about what you can do with MyChart, go to [email or web address].    Your next appointment:  3 months   The format for your next appointment: Office     Provider:  Dr.O'Neal      Time Spent with Patient: I have spent a total of 35 minutes with patient reviewing hospital notes, telemetry, EKGs, labs and examining the patient as well as establishing an assessment and plan that was discussed with the patient.  > 50% of time was spent in direct patient care.  Signed, Addison Naegeli. Audie Box, [place]  [address] [place], [place] [address] [phone]  08/12/2020 10:08 AM

## 2020-08-12 ENCOUNTER — Ambulatory Visit: Payer: Medicare Other | Admitting: Cardiovascular Disease

## 2020-08-12 ENCOUNTER — Encounter: Payer: Self-pay | Admitting: Cardiovascular Disease

## 2020-08-12 ENCOUNTER — Ambulatory Visit (INDEPENDENT_AMBULATORY_CARE_PROVIDER_SITE_OTHER): Payer: Medicare Other | Admitting: Pharmacist Clinician (PhC)/ Clinical Pharmacy Specialist

## 2020-08-12 ENCOUNTER — Telehealth: Payer: Self-pay | Admitting: Cardiovascular Disease

## 2020-08-12 ENCOUNTER — Other Ambulatory Visit: Payer: Self-pay

## 2020-08-12 ENCOUNTER — Telehealth (HOSPITAL_COMMUNITY): Payer: Self-pay | Admitting: *Deleted

## 2020-08-12 VITALS — BP 98/62 | HR 110 | Ht 61.0 in | Wt 213.0 lb

## 2020-08-12 DIAGNOSIS — I2510 Atherosclerotic heart disease of native coronary artery without angina pectoris: Secondary | ICD-10-CM

## 2020-08-12 DIAGNOSIS — E854 Organ-limited amyloidosis: Secondary | ICD-10-CM | POA: Diagnosis not present

## 2020-08-12 DIAGNOSIS — I4819 Other persistent atrial fibrillation: Secondary | ICD-10-CM

## 2020-08-12 DIAGNOSIS — Z7901 Long term (current) use of anticoagulants: Secondary | ICD-10-CM | POA: Diagnosis not present

## 2020-08-12 DIAGNOSIS — I43 Cardiomyopathy in diseases classified elsewhere: Secondary | ICD-10-CM

## 2020-08-12 DIAGNOSIS — I5022 Chronic systolic (congestive) heart failure: Secondary | ICD-10-CM | POA: Diagnosis not present

## 2020-08-12 DIAGNOSIS — I4891 Unspecified atrial fibrillation: Secondary | ICD-10-CM

## 2020-08-12 DIAGNOSIS — I513 Intracardiac thrombosis, not elsewhere classified: Secondary | ICD-10-CM | POA: Diagnosis not present

## 2020-08-12 DIAGNOSIS — I10 Essential (primary) hypertension: Secondary | ICD-10-CM

## 2020-08-12 LAB — PROTIME-INR
INR: 5.8 (ref 0.9–1.2)
Prothrombin Time: 57.5 s — ABNORMAL HIGH (ref 9.1–12.0)

## 2020-08-12 LAB — POCT INR: INR: 6.6 — AB (ref 2.0–3.0)

## 2020-08-12 MED ORDER — TORSEMIDE 20 MG PO TABS: ORAL_TABLET | ORAL | 3 refills | Status: DC

## 2020-08-12 NOTE — Telephone Encounter (Signed)
Addressed in office

## 2020-08-12 NOTE — Telephone Encounter (Signed)
Spoke with Rob w/[facility] Critical lab result INR 5.8

## 2020-08-12 NOTE — Telephone Encounter (Signed)
Rob from [employer] with critical lab results.

## 2020-08-12 NOTE — Patient Instructions (Addendum)
Medication Instructions:  Increase to  Torsemide 40 mg daily Continue all other medications *If you need a refill on your cardiac medications before your next appointment, please call your pharmacy*   Lab Work: INR today   Testing/Procedures: Schedule Tax inspector at [employer] office   Follow-Up: At [facility], you and your health needs are our priority.  As part of our continuing mission to provide you with exceptional heart care, we have created designated Provider Care Teams.  These Care Teams include your primary Cardiologist (physician) and Advanced Practice Providers (APPs -  Physician Assistants and Nurse Practitioners) who all work together to provide you with the care you need, when you need it.  We recommend signing up for the patient portal called "MyChart".  Sign up information is provided on this After Visit Summary.  MyChart is used to connect with patients for Virtual Visits (Telemedicine).  Patients are able to view lab/test results, encounter notes, upcoming appointments, etc.  Non-urgent messages can be sent to your provider as well.   To learn more about what you can do with MyChart, go to [email or web address].    Your next appointment:  3 months   The format for your next appointment: Office     Provider:  Dr.O'Neal

## 2020-08-12 NOTE — Telephone Encounter (Signed)
Patient given detailed instructions per Myocardial Perfusion Study Information Sheet for the test on 08/14/20 at 7:30. Patient notified to arrive 15 minutes early and that it is imperative to arrive on time for appointment to keep from having the test rescheduled.  If you need to cancel or reschedule your appointment, please call the office within 24 hours of your appointment. . Patient verbalized understanding.Regina [name]

## 2020-08-13 ENCOUNTER — Telehealth: Payer: Self-pay | Admitting: Physician Assistant

## 2020-08-13 NOTE — Telephone Encounter (Signed)
Patient called after hours for L shoulder and neck pain. On and off pain for 2 weeks. No associated chest tightness or shortness of breath. Arm feels numbs. No exacerbation with activity. She been taking tylenol for past 2 weeks but minimally improvement. Denies arthritis.   Her symptoms sounds atypical. Advised to try Tylenol and discussion with PCP. If no improvement, trial of SL nitro if SBP > 100. If pain is  very severe advised to call EMS. Reviewed yesterday's cardiology note.   Patient is agree with plan and appreciative of call.

## 2020-08-14 ENCOUNTER — Ambulatory Visit (HOSPITAL_COMMUNITY): Payer: Medicare Other | Attending: Internal Medicine

## 2020-08-14 ENCOUNTER — Other Ambulatory Visit: Payer: Self-pay

## 2020-08-14 DIAGNOSIS — I10 Essential (primary) hypertension: Secondary | ICD-10-CM | POA: Diagnosis not present

## 2020-08-14 DIAGNOSIS — I43 Cardiomyopathy in diseases classified elsewhere: Secondary | ICD-10-CM | POA: Diagnosis not present

## 2020-08-14 DIAGNOSIS — I2510 Atherosclerotic heart disease of native coronary artery without angina pectoris: Secondary | ICD-10-CM

## 2020-08-14 DIAGNOSIS — I4819 Other persistent atrial fibrillation: Secondary | ICD-10-CM

## 2020-08-14 DIAGNOSIS — E854 Organ-limited amyloidosis: Secondary | ICD-10-CM | POA: Diagnosis not present

## 2020-08-14 DIAGNOSIS — I5022 Chronic systolic (congestive) heart failure: Secondary | ICD-10-CM | POA: Diagnosis not present

## 2020-08-14 DIAGNOSIS — Z7901 Long term (current) use of anticoagulants: Secondary | ICD-10-CM

## 2020-08-14 DIAGNOSIS — I513 Intracardiac thrombosis, not elsewhere classified: Secondary | ICD-10-CM

## 2020-08-14 LAB — MYOCARDIAL PERFUSION IMAGING
LV dias vol: 131 mL (ref 46–106)
LV sys vol: 93 mL
Peak HR: 100 {beats}/min
Rest HR: 90 {beats}/min
SDS: 1
SRS: 0
SSS: 1
TID: 1

## 2020-08-14 MED ORDER — REGADENOSON 0.4 MG/5ML IV SOLN
0.4000 mg | Freq: Once | INTRAVENOUS | Status: AC
  Administered 2020-08-14: 0.4 mg via INTRAVENOUS

## 2020-08-14 MED ORDER — TECHNETIUM TC 99M TETROFOSMIN IV KIT
31.8000 | PACK | Freq: Once | INTRAVENOUS | Status: AC | PRN
  Administered 2020-08-14: 31.8 via INTRAVENOUS
  Filled 2020-08-14: qty 32

## 2020-08-14 MED ORDER — TECHNETIUM TC 99M TETROFOSMIN IV KIT
10.6000 | PACK | Freq: Once | INTRAVENOUS | Status: AC | PRN
  Administered 2020-08-14: 10.6 via INTRAVENOUS
  Filled 2020-08-14: qty 11

## 2020-08-19 ENCOUNTER — Ambulatory Visit: Payer: Medicare Other

## 2020-08-19 ENCOUNTER — Other Ambulatory Visit: Payer: Self-pay

## 2020-08-19 DIAGNOSIS — Z7901 Long term (current) use of anticoagulants: Secondary | ICD-10-CM | POA: Diagnosis not present

## 2020-08-19 DIAGNOSIS — I4819 Other persistent atrial fibrillation: Secondary | ICD-10-CM | POA: Diagnosis not present

## 2020-08-19 LAB — POCT INR: INR: 3.4 — AB (ref 2.0–3.0)

## 2020-08-19 NOTE — Patient Instructions (Signed)
Hold today and then continue 1/2 tablet daily except 1 tablet each Sunday and Wednesday.  Repeat INR in 2 weeks

## 2020-08-25 ENCOUNTER — Telehealth: Payer: Self-pay | Admitting: Cardiovascular Disease

## 2020-08-25 NOTE — Telephone Encounter (Signed)
° ° °  STAT if patient feels like he/she is going to faint   1) Are you dizzy now? No  2) Do you feel faint or have you passed out?  3) Do you have any other symptoms? headache  4) Have you checked your HR and BP (record if available)? 57/60  Pt said she felt really dizzy today, she said she thought she's going to pass out but after sitting down she felt a little better. She said she have headache and low BP she would like to know what she can do or any home remedies Dr. Audie Box recommends

## 2020-08-26 NOTE — Telephone Encounter (Signed)
Regina [name] is a pleasant 79 year old female with past medical history of amyloidosis, persistent A. fib with history of LAA thrombus, DM 2, nonobstructive CAD, hypertension, CKD stage III/IV.  Recent TEE revealed persistent LAA thrombus, she is on Coumadin.  Unable to undergo DCCV.  Rate control strategy was recommended.  Patient was seen by Dr. Audie Box on 73/57/8978, systolic blood pressure was low in the high 90s.  Looking back, her systolic blood pressure has been low around low 100s to the high 90s for the past 2 months.  Patient called triage yesterday complaining of worsening dizzy spell.  This morning her dizzy spell has improved.  Yesterday's blood pressure was 95/61.  This morning his blood pressure was 105/68, heart rate 94.  After talking with her for about 10 minutes, her blood pressure improved to 130/68, heart rate 82.  She is not as dizzy this morning when compared to yesterday.  I recommended slight increase in fluid intake in the morning when she feels extra dizzy.  She may also cut her fluid pill by half to 20 mg torsemide instead of 40 mg on the day when her systolic blood pressure is 95 mmHg or below.  She is scheduled to see Dr. Haroldine Laws of heart failure service on 09/16/2020.  She is aware that with her LV dysfunction, she should keep her daily fluid intake between 32 to 48 ounces.  She has also been instructed that when she feels extra dizzy, she should not be getting up instead, she should rest laying down.

## 2020-08-28 NOTE — Addendum Note (Signed)
Addended by: Wonda Horner on: 08/28/2020 02:14 PM   Modules accepted: Orders

## 2020-09-02 ENCOUNTER — Telehealth: Payer: Self-pay | Admitting: Cardiovascular Disease

## 2020-09-02 NOTE — Telephone Encounter (Signed)
Called and educated the pt on the proper dosing. Pt voiced understanding

## 2020-09-02 NOTE — Telephone Encounter (Signed)
Pt c/o medication issue:  1. Name of Medication: warfarin (COUMADIN) 5 MG tablet  2. How are you currently taking this medication (dosage and times per day)? Has taken 1/2 a tablet past two nights   3. Are you having a reaction (difficulty breathing--STAT)? No   4. What is your medication issue? Regina [name] is calling wanting to know how to take her coumadin tonight. Please advise.

## 2020-09-04 ENCOUNTER — Ambulatory Visit (INDEPENDENT_AMBULATORY_CARE_PROVIDER_SITE_OTHER): Payer: Medicare Other

## 2020-09-04 DIAGNOSIS — I4819 Other persistent atrial fibrillation: Secondary | ICD-10-CM | POA: Diagnosis not present

## 2020-09-04 DIAGNOSIS — Z7901 Long term (current) use of anticoagulants: Secondary | ICD-10-CM

## 2020-09-04 LAB — POCT INR: INR: 3.5 — AB (ref 2.0–3.0)

## 2020-09-04 NOTE — Patient Instructions (Signed)
Hold today and then decrease to 1/2 tablet daily except 1 tablet each Sunday and  Repeat INR in 2 weeks

## 2020-09-09 ENCOUNTER — Encounter: Payer: Self-pay | Admitting: Internal Medicine

## 2020-09-09 ENCOUNTER — Other Ambulatory Visit: Payer: Self-pay

## 2020-09-09 ENCOUNTER — Ambulatory Visit: Payer: Medicare Other | Admitting: Internal Medicine

## 2020-09-09 VITALS — BP 120/70 | HR 98 | Ht 61.0 in | Wt 213.0 lb

## 2020-09-09 DIAGNOSIS — E1122 Type 2 diabetes mellitus with diabetic chronic kidney disease: Secondary | ICD-10-CM | POA: Diagnosis not present

## 2020-09-09 DIAGNOSIS — E1140 Type 2 diabetes mellitus with diabetic neuropathy, unspecified: Secondary | ICD-10-CM | POA: Diagnosis not present

## 2020-09-09 DIAGNOSIS — Z794 Long term (current) use of insulin: Secondary | ICD-10-CM | POA: Diagnosis not present

## 2020-09-09 DIAGNOSIS — E1159 Type 2 diabetes mellitus with other circulatory complications: Secondary | ICD-10-CM | POA: Diagnosis not present

## 2020-09-09 DIAGNOSIS — E1142 Type 2 diabetes mellitus with diabetic polyneuropathy: Secondary | ICD-10-CM

## 2020-09-09 DIAGNOSIS — E11319 Type 2 diabetes mellitus with unspecified diabetic retinopathy without macular edema: Secondary | ICD-10-CM

## 2020-09-09 DIAGNOSIS — N1831 Chronic kidney disease, stage 3a: Secondary | ICD-10-CM

## 2020-09-09 LAB — POCT GLYCOSYLATED HEMOGLOBIN (HGB A1C): Hemoglobin A1C: 7.4 % — AB (ref 4.0–5.6)

## 2020-09-09 MED ORDER — INSULIN PEN NEEDLE 32G X 4 MM MISC: 1.0000 | Freq: Four times a day (QID) | 6 refills | Status: DC

## 2020-09-09 MED ORDER — INSULIN LISPRO (1 UNIT DIAL) 100 UNIT/ML (KWIKPEN): PEN_INJECTOR | SUBCUTANEOUS | 11 refills | Status: DC

## 2020-09-09 MED ORDER — FREESTYLE LIBRE 2 SENSOR MISC: 1.0000 | 6 refills | Status: DC

## 2020-09-09 MED ORDER — TOUJEO SOLOSTAR 300 UNIT/ML ~~LOC~~ SOPN
26.0000 [IU] | PEN_INJECTOR | Freq: Every day | SUBCUTANEOUS | 6 refills | Status: DC
Start: 2020-09-09 — End: 2021-04-21

## 2020-09-09 NOTE — Progress Notes (Signed)
Name: Regina [name]  MRN/ DOB: [id number], 1941/02/24   Age/ Sex: 79 y.o., female    PCP: [name], [name], MD   Reason for Endocrinology Evaluation: Type 2 Diabetes Mellitus     Date of Initial Endocrinology Visit: 09/09/2020     PATIENT IDENTIFIER: Regina [name] is a 79 y.o. female with a past medical history of T2DM, HTN, CAD, OSA on CPAP  and A.Fib . The patient presented for initial endocrinology clinic visit on 09/09/2020 for consultative assistance with her diabetes management.    HPI: [name] is accompanied by her sister Zigmund Daniel    Diagnosed with DM in years ago Prior Medications tried/Intolerance: Ozempic - ran out  Currently checking blood sugars 1x / day Hypoglycemia episodes : lowest is 72 mg/dL            Symptoms: yes            Frequency: rare Hemoglobin A1c has ranged from 6.9% in 2021, peaking at 7.8% in 2020. Patient required assistance for hypoglycemia: no Patient has required hospitalization within the last 1 year from hyper or hypoglycemia: no   In terms of diet, the patient eats 2 meals a day . Snacks occasionally. Avoids sugar-sweetened beverages  Nephrology [facility] - Dr. peoples    HOME DIABETES REGIMEN: Toujeo 30 units daily    Statin: yes ACE-I/ARB: no     METER DOWNLOAD SUMMARY: Date range evaluated: 11/10-11/24/2021 Average Number Tests/Day = 0.9 Overall Mean FS Glucose =124 Standard Deviation = 38  BG Ranges: Low = 72 High = 185   Hypoglycemic Events/30 Days: BG < 50 = 0 Episodes of symptomatic severe hypoglycemia = 0   DIABETIC COMPLICATIONS: Microvascular complications:   CKD III, Retinopathy ( hx of laser )   Denies: neuropathy   Last eye exam: Completed 2021   Macrovascular complications:   Non-obstructive CAD   Denies: PVD, CVA   PAST HISTORY: Past Medical History:  Past Medical History:  Diagnosis Date  . (HFpEF) heart failure with preserved ejection fraction ([place])   . Back pain   .  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 Diabetes mellitus without complication ([place])   . Dyspnea   . GERD (gastroesophageal reflux disease)   . H/O echocardiogram    a. 01/2015 Echo: EF 55-60%, Gr 2 DD, mod LVH, mildly dil LA.  Marland Kitchen Heart disease   . Hypertension   . Hypothyroidism   . Joint pain   . Kidney problem   . Lower extremity edema   . Mini stroke ([place])   . Non-obstructive CAD    a. 01/2015 Cardiolite: + inf wall ischemia, EF 56%;  b. 01/2015 Cath: LM nl, LAD 41m LCX min irregs, RCA dominant, 50-641m . Sleep apnea   . Swallowing difficulty   . Thyroid disease    Past Surgical History:  Past Surgical History:  Procedure Laterality Date  . ABDOMINAL HYSTERECTOMY    . BUBBLE STUDY  07/01/2020   Procedure: BUBBLE STUDY;  Surgeon: AcElouise MunroeMD;  [facility]: Cardiovascular;;  . BUBBLE STUDY  08/05/2020   Procedure: BUBBLE STUDY;  Surgeon: O'Geralynn RileMD;  [facility]: Cardiovascular;;  done with definity   . CARDIAC CATHETERIZATION    . ESOPHAGOGASTRODUODENOSCOPY     a. 01/2015 EGD: patent esophagus.  . ESOPHAGOGASTRODUODENOSCOPY N/A 01/20/2015   Procedure: ESOPHAGOGASTRODUODENOSCOPY (EGD);  Surgeon: PaCarol AdaMD;  Location: [facility];  Service: Endoscopy;  Laterality: N/A;  . HAND SURGERY    . JOINT REPLACEMENT     reports history bilateral TKA and right TSA  . LEFT HEART CATHETERIZATION WITH CORONARY ANGIOGRAM N/A 01/19/2015  . TEE WITHOUT CARDIOVERSION  05/04/2020  . TEE WITHOUT CARDIOVERSION N/A 05/05/2020   Procedure: TRANSESOPHAGEAL ECHOCARDIOGRAM (TEE);  Surgeon: Pixie Casino, MD;  Location: [facility] ENDOSCOPY;  Service: Cardiovascular;  Laterality: N/A;  . TEE WITHOUT CARDIOVERSION N/A 07/01/2020   Procedure: TRANSESOPHAGEAL ECHOCARDIOGRAM (TEE);  Surgeon: Elouise Munroe, MD;  Location: [facility] ENDOSCOPY;  Service: Cardiovascular;  Laterality: N/A;  . TEE  WITHOUT CARDIOVERSION N/A 08/05/2020   Procedure: TRANSESOPHAGEAL ECHOCARDIOGRAM (TEE);  Surgeon: Geralynn Rile, MD;  Location: [facility];  Service: Cardiovascular;  Laterality: N/A;  . THYROIDECTOMY        Social History:  reports that she has never smoked. She has never used smokeless tobacco. She reports that she does not drink alcohol. No history on file for drug use. Family History:  Family History  Problem Relation Age of Onset  . Heart disease Mother   . Hypertension Mother   . Cancer Father   . Alcoholism Father      HOME MEDICATIONS: Allergies as of 09/09/2020      Reactions   Ace Inhibitors Other (See Comments)   unknown   Amlodipine Other (See Comments)   unknown   Atenolol Other (See Comments)   bradycardia   Avandia [rosiglitazone] Other (See Comments)   unknown   Darvon [propoxyphene] Other (See Comments)   unknown   Erythromycin Itching   Hydralazine Other (See Comments)   Burning in throat and chest   Hydrocodone Other (See Comments)   Hallucinations.   Levofloxacin Itching   Morphine And Related Other (See Comments)   Dizzy and hallucianation, vomiting; Willing to try low dose   Percocet [oxycodone-acetaminophen] Other (See Comments)   hallucination   Spironolactone Other (See Comments)   unknown   Tramadol Other (See Comments)   Unknown/does not recall reaction but does not want to take again      Medication List       Accurate as of September 09, 2020  3:42 PM. If you have any questions, ask your nurse or doctor.        Accu-Chek [employer] Kit Use to test blood sugar up to 3 times daily   Accu-Chek Guide test strip Generic drug: glucose blood Use to test blood sugar up to 3 times daily   Accu-Chek Guide w/Device Kit 1 Device by Does not apply route 3 (three) times daily.   allopurinol 100 MG tablet Commonly known as: ZYLOPRIM Take 1 tablet (100 mg total) by mouth daily.   atorvastatin 20 MG tablet Commonly known  as: LIPITOR Take 1 tablet (20 mg total) by mouth daily. What changed: when to take this   B-D SINGLE USE SWABS REGULAR Pads Use to test blood sugar up to 3 times daily   clotrimazole-betamethasone cream Commonly known as: Lotrisone Apply 1 application topically 2 (two) times daily as needed. What changed: reasons to take this   cyclobenzaprine 5 MG tablet Commonly known as: FLEXERIL TAKE 1 TABLET BY MOUTH ONCE DAILY AS NEEDED   famotidine 20 MG tablet Commonly known as: Pepcid Take 1 tablet (20 mg total) by mouth at bedtime.   Fish Oil 1000 MG Caps Take 2,000 mg by mouth daily.   fluticasone 50 MCG/ACT nasal spray Commonly known as: FLONASE Place 1  spray into both nostrils daily.   gabapentin 600 MG tablet Commonly known as: NEURONTIN Take 1 tablet by mouth twice daily   GERITOL COMPLETE PO Take 1 tablet by mouth daily.   lactulose 10 GM/15ML solution Commonly known as: CHRONULAC Take 20 g by mouth daily as needed for mild constipation.   levothyroxine 150 MCG tablet Commonly known as: SYNTHROID Take 1 tablet (150 mcg total) by mouth daily before breakfast.   loratadine 10 MG tablet Commonly known as: CLARITIN Take 1 tablet (10 mg total) by mouth daily as needed. What changed: reasons to take this   magnesium oxide 400 (241.3 Mg) MG tablet Commonly known as: MAG-OX Take 1 tablet by mouth once daily   meclizine 25 MG tablet Commonly known as: ANTIVERT Take 25 mg by mouth daily as needed for dizziness.   metoprolol tartrate 100 MG tablet Commonly known as: LOPRESSOR Take 0.5 tablets (50 mg total) by mouth 2 (two) times daily.   nitroGLYCERIN 0.4 MG SL tablet Commonly known as: NITROSTAT Place 1 tablet (0.4 mg total) under the tongue every 5 (five) minutes as needed for chest pain.   pantoprazole 40 MG tablet Commonly known as: PROTONIX Take 1 tablet (40 mg total) by mouth daily. Take 30 minutes before eating in the morning with 8oz water   polyethylene  glycol 17 g packet Commonly known as: MIRALAX / GLYCOLAX Take 17 g by mouth daily as needed for mild constipation.   SYSTANE BALANCE OP Place 1 drop into both eyes daily as needed (for dry eyes).   Tafamidis 61 MG Caps Take 61 mg by mouth daily.   terazosin 5 MG capsule Commonly known as: HYTRIN Take 5 mg by mouth at bedtime.   torsemide 20 MG tablet Commonly known as: Demadex Take 2 tablets ( 40 mg ) daily   Toujeo SoloStar 300 UNIT/ML Solostar Pen Generic drug: insulin glargine (1 Unit Dial) Inject 25 Units into the skin daily. Eat a snack with protein nightly before bedtime. What changed:   how much to take  additional instructions   vitamin C 1000 MG tablet Take 1,000 mg by mouth daily.   Vitamin D3 50 MCG (2000 UT) capsule Take 2,000 Units by mouth daily.   Voltaren 1 % Gel Generic drug: diclofenac Sodium Apply 1 application topically daily as needed (arthritis pain/hands).   warfarin 5 MG tablet Commonly known as: COUMADIN Take as directed by the anticoagulation clinic. If you are unsure how to take this medication, talk to your nurse or doctor. Original instructions: Take 1 tablet (5 mg total) by mouth daily. What changed:   how much to take  when to take this  additional instructions        ALLERGIES: Allergies  Allergen Reactions  . Ace Inhibitors Other (See Comments)    unknown  . Amlodipine Other (See Comments)    unknown  . Atenolol Other (See Comments)    bradycardia  . Avandia [Rosiglitazone] Other (See Comments)    unknown  . Darvon [Propoxyphene] Other (See Comments)    unknown  . Erythromycin Itching  . Hydralazine Other (See Comments)    Burning in throat and chest  . Hydrocodone Other (See Comments)    Hallucinations.  . Levofloxacin Itching  . Morphine And Related Other (See Comments)    Dizzy and hallucianation, vomiting; Willing to try low dose  . Percocet [Oxycodone-Acetaminophen] Other (See Comments)    hallucination   . Spironolactone Other (See Comments)    unknown  .  Tramadol Other (See Comments)    Unknown/does not recall reaction but does not want to take again     REVIEW OF SYSTEMS: A comprehensive ROS was conducted with the patient and is negative except as per HPI and below:  ROS    OBJECTIVE:   VITAL SIGNS: BP 120/70   Pulse 98   Ht 5' 1"  (1.549 m)   Wt 213 lb (96.6 kg)   SpO2 98%   BMI 40.25 kg/m    PHYSICAL EXAM:  General: Pt appears well and is in NAD  Neck: General: Supple without adenopathy or carotid bruits. Thyroid: Thyroid size normal.  No goiter or nodules appreciated. No thyroid bruit.  Lungs: Clear with good BS bilat with no rales, rhonchi, or wheezes  Heart: RRR with normal S1 and S2 and no gallops; no murmurs; no rub  Abdomen: Normoactive bowel sounds, soft, nontender, without masses or organomegaly palpable  Extremities:  Lower extremities - No pretibial edema. No lesions.  Skin: Normal texture and temperature to palpation. No rash noted. No Acanthosis nigricans/skin tags. No lipohypertrophy.  Neuro: MS is good with appropriate affect, pt is alert and Ox3    DM foot exam: 09/09/2020   The skin of the feet is intact without sores or ulcerations. The pedal pulses are undetectable The sensation is decreased to a screening 5.07, 10 gram monofilament bilaterally  DATA REVIEWED:  Lab Results  Component Value Date   HGBA1C 7.4 (A) 09/09/2020   HGBA1C 7.6 (H) 03/24/2020   HGBA1C 6.9 (A) 11/12/2019   Lab Results  Component Value Date   MICROALBUR 1.9 05/22/2019   LDLCALC 49 03/24/2020   CREATININE 1.87 (H) 08/03/2020   Lab Results  Component Value Date   MICRALBCREAT 6.0 05/22/2019    Lab Results  Component Value Date   CHOL 117 03/24/2020   HDL 56 03/24/2020   LDLCALC 49 03/24/2020   TRIG 54 03/24/2020   CHOLHDL 2 05/22/2019        ASSESSMENT / PLAN / RECOMMENDATIONS:   1) Type 2 Diabetes Mellitus, Sub-Optimally controlled, With retinopathic,  neuropathic , CKD III and Macrovascular  complications - Most recent A1c of 7.4 %. Goal A1c < 7.0%.     - She was on Ozempic through the weight and wellness clinic but ran out of refills as she stopped going.  - I will hold off on this due to retinopathy, until this is stabilized.  - Limited anti glycemic agents due to CKD III - She will be provided with correction scale as below   MEDICATIONS:  Decrease Toujeo to 26 units daily   CF: Humalog: ( BG-120/30)  EDUCATION / INSTRUCTIONS:  BG monitoring instructions: Patient is instructed to check her blood sugars 3 times a day, before meals .  Call [facility] if: BG persistently < 70  . I reviewed the Rule of 15 for the treatment of hypoglycemia in detail with the patient. Literature supplied.   2) Diabetic complications:   Eye: Does  have known diabetic retinopathy.   Neuro/ Feet: Does have known diabetic peripheral neuropathy.  Renal: Patient does have known baseline CKD. She is not on an ACEI/ARB at present.   3) Lipids: Patient is on Atorvastatin 20 mg daily . LDL acceptable.       F/U in 3 months   Signed electronically by: Mack Guise, MD  [facility]  [facility] [address] [place], [place] [address] Phone: [phone] FAX: [phone]  CC: [name], [name], [place] [address] Phone: [phone]  Fax: [phone]    Return to Endocrinology clinic as below: Future Appointments  Date Time Provider [place]  09/16/2020  2:20 PM Bensimhon, Shaune Pascal, MD [facility] None  09/18/2020 11:15 AM [facility]  11/13/2020 10:00 AM O'Neal, Cassie Freer, MD [facility]

## 2020-09-09 NOTE — Patient Instructions (Addendum)
-   Decrease Toujeo to 26 units daily  -Humalog correctional insulin:. Use the scale below to help guide you:   Blood sugar before meal Number of units to inject  Less than 150 0 unit  151 -  180 1 units  181 -  210 2 units  211 -  240 3 units  241 -  270 4 units  271 -  300 5 units     HOW TO TREAT LOW BLOOD SUGARS (Blood sugar LESS THAN 70 MG/DL)  Please follow the RULE OF 15 for the treatment of hypoglycemia treatment (when your (blood sugars are less than 70 mg/dL)    STEP 1: Take 15 grams of carbohydrates when your blood sugar is low, which includes:   3-4 GLUCOSE TABS  OR  3-4 OZ OF JUICE OR REGULAR SODA OR  ONE TUBE OF GLUCOSE GEL     STEP 2: RECHECK blood sugar in 15 MINUTES STEP 3: If your blood sugar is still low at the 15 minute recheck --> then, go back to STEP 1 and treat AGAIN with another 15 grams of carbohydrates.

## 2020-09-12 NOTE — Progress Notes (Signed)
ADVANCED HF CLINIC CONSULT NOTE  Referring Physician: Evalina Field, MD Primary Care: [name], [name], MD Primary Cardiologist: Evalina Field, MD  HPI:  Regina [name] is a 79 y.o. female with morbid obesity, chronic systolic HF (EF 83%) likely tachy-induced, hATTR amyloidosis, persistent Afib with LAA thrombus, DM2, non-obstructive CAD (cath 2016), HTN, OSA, CKD 3/4 who is referred by Dr. Audie Box for further management of her TTR cardiac amyloidosis  Underwent cath in 4/16 for CP. Non-obstructive CAD, Echo at that time EF 55-60%.  Echo 04/03/20 EF 55-60% Grade 2 DD. RV ok   PYP 04/16/20: Positive Grade 3. Genetic testing + for hATTR (Val142Ile mutation)   Admitted 7/21 for  AF with RVR. EF 30-40% on TEE with LAA sludge so DC-CV deferred, HR remained high so carvedilol changed to Toprol  Underwent repeat TEE on 07/01/20 and EF 15% with persistent LAA clot. DC-CV cancelled. Myoview 08/14/20 EF 29% no ischemia or scar.  She is here with her daughter. Says she has good days and bad days. Says she feels lightheaded every morning but better if she sits down. Doesn't feel like room spinning. Mild SOB. Can go to the store and walk behind the cart. Gets SOB if goes to fast. Occasional tightness in chest. No bleeding on coumadin. No edema. Wearing CPAP religiously.    Problem List 1.hATTR Cardiac amyloid (Val142Ile mutation) -Grade 2DD -positive PYP ( 1.4 H/CL; visually grade 3) -negative SPEP/UPEP -EF20% in Afib with RVR 2. Persistent Afib -LAA slugde vs thrombus7/20/2021 -persistent sludge 07/01/2020 -LAA thrombus persistent 08/05/2020 -on warfarin 3. DM -A1c 7.4 4. HLD -T chol 98, HDL 44, LDL 30, TG 119 5. HTN 6. Non-obstructive CAD -LHC 01/19/2015  -50% mid RCA and 50% LAD 7. OSA 8. CKD 3/4 - creatinine 1.9-2.2   Review of Systems: [y] = yes, _0  = no   General: Weight gain _1 ; Weight loss _2 ; Anorexia _3 ; Fatigue _4 ; Fever _5 ; Chills _6 ; Weakness _7     Cardiac: Chest pain/pressure _8 ; Resting SOB _9 ; Exertional SOB _10 ; Orthopnea _11 ; Pedal Edema _12 ; Palpitations _13 ; Syncope _14 ; Presyncope _15 ; Paroxysmal nocturnal dyspnea_16   Pulmonary: Cough _17 ; Wheezing_18 ; Hemoptysis_19 ; Sputum _20 ; Snoring _21   GI: Vomiting_22 ; Dysphagia_23 ; Melena_24 ; Hematochezia _25 ; Heartburn_26 ; Abdominal pain _27 ; Constipation _28 ; Diarrhea _29 ; BRBPR _30   GU: Hematuria_31 ; Dysuria _32 ; Nocturia_33   Vascular: Pain in legs with walking _34 ; Pain in feet with lying flat _35 ; Non-healing sores _36 ; Stroke _37 ; TIA _38 ; Slurred speech _39 ;  Neuro: Headaches_40 ; Vertigo_41 ; Seizures_42 ; Paresthesias_43 ;Blurred vision _44 ; Diplopia _45 ; Vision changes _46   Ortho/Skin: Arthritis _47 ; Joint pain _48 ; Muscle pain _49 ; Joint swelling _50 ; Back Pain _51 ; Rash _52   Psych: Depression_53 ; Anxiety_54   Heme: Bleeding problems _55 ; Clotting disorders _56 ; Anemia _57   Endocrine: Diabetes _58 ; Thyroid dysfunction_59    Past Medical History:  Diagnosis Date  . (HFpEF) heart failure with preserved ejection fraction ([place])   . Back pain   . 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 Diabetes mellitus without complication ([place])   . Dyspnea   . GERD (gastroesophageal reflux disease)   . H/O echocardiogram  a. 01/2015 Echo: EF 55-60%, Gr 2 DD, mod LVH, mildly dil LA.  Marland Kitchen Heart disease   . Hypertension   . Hypothyroidism   . Joint pain   . Kidney problem   . Lower extremity edema   . Mini stroke ([place])   . Non-obstructive CAD    a. 01/2015 Cardiolite: + inf wall ischemia, EF 56%;  b. 01/2015 Cath: LM nl, LAD 15m LCX min irregs, RCA dominant, 50-671m . Sleep apnea   . Swallowing difficulty   . Thyroid disease     Current Outpatient Medications  Medication Sig Dispense Refill  . Alcohol Swabs (B-D SINGLE USE SWABS REGULAR) PADS Use to test blood sugar up to 3 times daily 100 each 3  . allopurinol (ZYLOPRIM) 100 MG tablet  Take 1 tablet (100 mg total) by mouth daily. 30 tablet 3  . Ascorbic Acid (VITAMIN C) 1000 MG tablet Take 1,000 mg by mouth daily.    . Marland Kitchentorvastatin (LIPITOR) 20 MG tablet Take 1 tablet (20 mg total) by mouth daily. (Patient taking differently: Take 20 mg by mouth at bedtime. ) 90 tablet 2  . Blood Glucose Monitoring Suppl (ACCU-CHEK GUIDE) w/Device KIT 1 Device by Does not apply route 3 (three) times daily. 1 kit 0  . Cholecalciferol (VITAMIN D3) 50 MCG (2000 UT) capsule Take 2,000 Units by mouth daily.    . clotrimazole-betamethasone (LOTRISONE) cream Apply 1 application topically 2 (two) times daily as needed. (Patient taking differently: Apply 1 application topically 2 (two) times daily as needed (rash). ) 30 g 1  . Continuous Blood Gluc Sensor (FREESTYLE LIBRE 2 SENSOR) MISC 1 Device by Does not apply route as directed. 2 each 6  . cyclobenzaprine (FLEXERIL) 5 MG tablet TAKE 1 TABLET BY MOUTH ONCE DAILY AS NEEDED 30 tablet 0  . diclofenac Sodium (VOLTAREN) 1 % GEL Apply 1 application topically daily as needed (arthritis pain/hands).    . famotidine (PEPCID) 20 MG tablet Take 1 tablet (20 mg total) by mouth at bedtime. 90 tablet 1  . fluticasone (FLONASE) 50 MCG/ACT nasal spray Place 1 spray into both nostrils daily.     . Marland Kitchenabapentin (NEURONTIN) 600 MG tablet Take 1 tablet by mouth twice daily 60 tablet 2  . glucose blood (ACCU-CHEK GUIDE) test strip Use to test blood sugar up to 3 times daily 100 each 3  . insulin glargine, 1 Unit Dial, (TOUJEO SOLOSTAR) 300 UNIT/ML Solostar Pen Inject 26 Units into the skin daily. Eat a snack with protein nightly before bedtime. 15 mL 6  . insulin lispro (HUMALOG KWIKPEN) 100 UNIT/ML KwikPen Max daily 15 units 15 mL 11  . Insulin Pen Needle 32G X 4 MM MISC 1 Device by Does not apply route in the morning, at noon, in the evening, and at bedtime. 150 each 6  . Iron-Vitamins (GERITOL COMPLETE PO) Take 1 tablet by mouth daily.    . Marland Kitchenactulose (CHRONULAC) 10 GM/15ML  solution Take 20 g by mouth daily as needed for mild constipation.     . Lancets Misc. (ACCU-CHEK FASTCLIX LANCET) KIT Use to test blood sugar up to 3 times daily 1 kit 1  . levothyroxine (SYNTHROID) 150 MCG tablet Take 1 tablet (150 mcg total) by mouth daily before breakfast. 90 tablet 2  . loratadine (CLARITIN) 10 MG tablet Take 1 tablet (10 mg total) by mouth daily as needed. (Patient taking differently: Take 10 mg by mouth daily as needed for allergies. ) 90 tablet 0  . magnesium  oxide (MAG-OX) 400 (241.3 Mg) MG tablet Take 1 tablet by mouth once daily (Patient taking differently: Take 400 mg by mouth daily. ) 90 tablet 0  . meclizine (ANTIVERT) 25 MG tablet Take 25 mg by mouth daily as needed for dizziness.     . metoprolol tartrate (LOPRESSOR) 100 MG tablet Take 0.5 tablets (50 mg total) by mouth 2 (two) times daily. 60 tablet 3  . nitroGLYCERIN (NITROSTAT) 0.4 MG SL tablet Place 1 tablet (0.4 mg total) under the tongue every 5 (five) minutes as needed for chest pain. 25 tablet 2  . Omega-3 Fatty Acids (FISH OIL) 1000 MG CAPS Take 2,000 mg by mouth daily.     . pantoprazole (PROTONIX) 40 MG tablet Take 1 tablet (40 mg total) by mouth daily. Take 30 minutes before eating in the morning with 8oz water 30 tablet 11  . polyethylene glycol (MIRALAX / GLYCOLAX) 17 g packet Take 17 g by mouth daily as needed for mild constipation. 14 each 0  . Propylene Glycol (SYSTANE BALANCE OP) Place 1 drop into both eyes daily as needed (for dry eyes).    . Tafamidis 61 MG CAPS Take 61 mg by mouth daily. 30 capsule 6  . terazosin (HYTRIN) 5 MG capsule Take 5 mg by mouth at bedtime.    . torsemide (DEMADEX) 20 MG tablet Take 2 tablets ( 40 mg ) daily 180 tablet 3  . warfarin (COUMADIN) 5 MG tablet Take 1 tablet (5 mg total) by mouth daily. (Patient taking differently: Take 2.5 mg by mouth See admin instructions. Take 2.5 mg Mon., Wed., Fri. Take 5 mg all the other days at bedtime) 90 tablet 3   No current  facility-administered medications for this encounter.    Allergies  Allergen Reactions  . Ace Inhibitors Other (See Comments)    unknown  . Amlodipine Other (See Comments)    unknown  . Atenolol Other (See Comments)    bradycardia  . Avandia [Rosiglitazone] Other (See Comments)    unknown  . Darvon [Propoxyphene] Other (See Comments)    unknown  . Erythromycin Itching  . Hydralazine Other (See Comments)    Burning in throat and chest  . Hydrocodone Other (See Comments)    Hallucinations.  . Levofloxacin Itching  . Morphine And Related Other (See Comments)    Dizzy and hallucianation, vomiting; Willing to try low dose  . Percocet [Oxycodone-Acetaminophen] Other (See Comments)    hallucination  . Spironolactone Other (See Comments)    unknown  . Tramadol Other (See Comments)    Unknown/does not recall reaction but does not want to take again      Social History   Socioeconomic History  . Marital status: Widowed    Spouse name: Not on file  . Number of children: Not on file  . Years of education: Not on file  . Highest education level: Not on file  Occupational History  . Occupation: Retired  Tobacco Use  . Smoking status: Never Smoker  . Smokeless tobacco: Never Used  Vaping Use  . Vaping Use: Never used  Substance and Sexual Activity  . Alcohol use: No  . Drug use: Not on file  . Sexual activity: Not Currently  Other Topics Concern  . Not on file  Social History Narrative   Lives with daughter   Social Determinants of Health   Financial Resource Strain:   . Difficulty of Paying Living Expenses: Not on file  Food Insecurity:   . Worried About  Running Out of Food in the Last Year: Not on file  . Ran Out of Food in the Last Year: Not on file  Transportation Needs:   . Lack of Transportation (Medical): Not on file  . Lack of Transportation (Non-Medical): Not on file  Physical Activity:   . Days of Exercise per Week: Not on file  . Minutes of Exercise  per Session: Not on file  Stress:   . Feeling of Stress : Not on file  Social Connections:   . Frequency of Communication with Friends and Family: Not on file  . Frequency of Social Gatherings with Friends and Family: Not on file  . Attends Religious Services: Not on file  . Active Member of Clubs or Organizations: Not on file  . Attends Archivist Meetings: Not on file  . Marital Status: Not on file  Intimate Partner Violence:   . Fear of Current or Ex-Partner: Not on file  . Emotionally Abused: Not on file  . Physically Abused: Not on file  . Sexually Abused: Not on file      Family History  Problem Relation Age of Onset  . Heart disease Mother   . Hypertension Mother   . Cancer Father   . Alcoholism Father     Vitals:   09/16/20 1433  BP: 128/70  Pulse: 93  SpO2: 99%  Weight: 97.5 kg (215 lb)    Sitting 104/58 Standing 108/58  PHYSICAL EXAM: General:  Well appearing. No respiratory difficulty HEENT: normal Neck: supple. no JVD. Carotids 2+ bilat; no bruits. No lymphadenopathy or thryomegaly appreciated. Cor: PMI nondisplaced. Regular rate & rhythm. No rubs, gallops or murmurs. Lungs: clear Abdomen: soft, nontender, nondistended. No hepatosplenomegaly. No bruits or masses. Good bowel sounds. Extremities: no cyanosis, clubbing, rash, edema Neuro: alert & oriented x 3, cranial nerves grossly intact. moves all 4 extremities w/o difficulty. Affect pleasant.  ECG: AF 91 Personally reviewed    ASSESSMENT & PLAN:   1. Chronic systolic heart failure - cath 4/16 nonobstructive CAD - Echo 04/03/20 EF 55-60% Grade 2 DD. RV ok  - PYP 04/16/20: Positive Grade 3. Genetic testing + for hATTR (Val142Ile mutation)  - TEE  7/21 in setting of AF with RVR. EF 30-40%  - TEE  07/01/20 EF 15% with persistent LAA clot. - Given time course, cardiomyopathy almostl certainly due to AF and not TTR - NYHA III - Volume status ok on torsemide 40 daily - Continue Toprol 50  bid - Agree with starting Farxiga per Dr. Joylene Grapes  - Not on ARNI/ARB or spiro with low BP and CKD IV - with worsening LV dysfunction need to ensure rate control. Place Zio. If not rate controlled may need to consider amio for rate control despite risk of chemical cardioversion   2. hATTR cardiac amyloidosis - positive PYP 7/21 ( 1.4 H/CL; visually grade 3) - genetic testing + for Val142Ile mutation - negative SPEP/UPEP - awaiting tafamadis - suspect LV dysfunction due to AF and not TTR - Eventual referral to Dr[facility] Lora Paula at [facility] to discuss clinical trials  3. Persistent atrial fibrillation ([place]) -  She has had 3 attempts at [place].  They have been unsuccessful due to left atrial appendage thrombus.   - Eliquis switched to coumadin  - remains symptomatic despite apparent rate control - with worsening LV dysfunction need to ensure rate control. Place Zio. If not rate controlled may need to consider amio for rate control despite risk of chemical cardioversion  4, Coronary artery disease  -Cath 4/16 with nonobstructive CAD - Myoview 10/.21 EF 29% no ischemia    5. CKD IV - creatinine 1.9-2.2 - follows with Dr. Annitta Jersey, MD  11:22 PM

## 2020-09-14 DIAGNOSIS — E1122 Type 2 diabetes mellitus with diabetic chronic kidney disease: Secondary | ICD-10-CM | POA: Insufficient documentation

## 2020-09-14 DIAGNOSIS — E11319 Type 2 diabetes mellitus with unspecified diabetic retinopathy without macular edema: Secondary | ICD-10-CM | POA: Insufficient documentation

## 2020-09-14 DIAGNOSIS — E1142 Type 2 diabetes mellitus with diabetic polyneuropathy: Secondary | ICD-10-CM | POA: Insufficient documentation

## 2020-09-14 DIAGNOSIS — E119 Type 2 diabetes mellitus without complications: Secondary | ICD-10-CM | POA: Insufficient documentation

## 2020-09-14 DIAGNOSIS — Z794 Long term (current) use of insulin: Secondary | ICD-10-CM | POA: Insufficient documentation

## 2020-09-14 DIAGNOSIS — N1831 Chronic kidney disease, stage 3a: Secondary | ICD-10-CM | POA: Insufficient documentation

## 2020-09-15 DIAGNOSIS — E1122 Type 2 diabetes mellitus with diabetic chronic kidney disease: Secondary | ICD-10-CM | POA: Diagnosis not present

## 2020-09-15 DIAGNOSIS — E785 Hyperlipidemia, unspecified: Secondary | ICD-10-CM | POA: Diagnosis not present

## 2020-09-15 DIAGNOSIS — N1832 Chronic kidney disease, stage 3b: Secondary | ICD-10-CM | POA: Diagnosis not present

## 2020-09-15 DIAGNOSIS — I2510 Atherosclerotic heart disease of native coronary artery without angina pectoris: Secondary | ICD-10-CM | POA: Diagnosis not present

## 2020-09-15 DIAGNOSIS — I129 Hypertensive chronic kidney disease with stage 1 through stage 4 chronic kidney disease, or unspecified chronic kidney disease: Secondary | ICD-10-CM | POA: Diagnosis not present

## 2020-09-16 ENCOUNTER — Other Ambulatory Visit: Payer: Self-pay

## 2020-09-16 ENCOUNTER — Ambulatory Visit (INDEPENDENT_AMBULATORY_CARE_PROVIDER_SITE_OTHER): Payer: Medicare Other

## 2020-09-16 ENCOUNTER — Encounter (HOSPITAL_COMMUNITY): Payer: Self-pay | Admitting: Internal Medicine

## 2020-09-16 ENCOUNTER — Ambulatory Visit (HOSPITAL_COMMUNITY)
Admission: RE | Admit: 2020-09-16 | Discharge: 2020-09-16 | Disposition: A | Discharge status: Home / Self Care | Payer: Medicare Other | Source: Ambulatory Visit | Attending: Internal Medicine | Admitting: Internal Medicine

## 2020-09-16 VITALS — BP 128/70 | HR 93 | Wt 215.0 lb

## 2020-09-16 DIAGNOSIS — I4819 Other persistent atrial fibrillation: Secondary | ICD-10-CM

## 2020-09-16 DIAGNOSIS — Z794 Long term (current) use of insulin: Secondary | ICD-10-CM | POA: Diagnosis not present

## 2020-09-16 DIAGNOSIS — N184 Chronic kidney disease, stage 4 (severe): Secondary | ICD-10-CM | POA: Insufficient documentation

## 2020-09-16 DIAGNOSIS — E785 Hyperlipidemia, unspecified: Secondary | ICD-10-CM | POA: Diagnosis not present

## 2020-09-16 DIAGNOSIS — Z7901 Long term (current) use of anticoagulants: Secondary | ICD-10-CM

## 2020-09-16 DIAGNOSIS — R079 Chest pain, unspecified: Secondary | ICD-10-CM

## 2020-09-16 DIAGNOSIS — E1122 Type 2 diabetes mellitus with diabetic chronic kidney disease: Secondary | ICD-10-CM | POA: Insufficient documentation

## 2020-09-16 DIAGNOSIS — I5022 Chronic systolic (congestive) heart failure: Secondary | ICD-10-CM

## 2020-09-16 DIAGNOSIS — Z79899 Other long term (current) drug therapy: Secondary | ICD-10-CM | POA: Diagnosis not present

## 2020-09-16 DIAGNOSIS — I5032 Chronic diastolic (congestive) heart failure: Secondary | ICD-10-CM | POA: Diagnosis not present

## 2020-09-16 DIAGNOSIS — E854 Organ-limited amyloidosis: Secondary | ICD-10-CM | POA: Diagnosis not present

## 2020-09-16 DIAGNOSIS — I43 Cardiomyopathy in diseases classified elsewhere: Secondary | ICD-10-CM | POA: Diagnosis not present

## 2020-09-16 DIAGNOSIS — I2510 Atherosclerotic heart disease of native coronary artery without angina pectoris: Secondary | ICD-10-CM | POA: Diagnosis not present

## 2020-09-16 DIAGNOSIS — I130 Hypertensive heart and chronic kidney disease with heart failure and stage 1 through stage 4 chronic kidney disease, or unspecified chronic kidney disease: Secondary | ICD-10-CM | POA: Diagnosis not present

## 2020-09-16 DIAGNOSIS — I4891 Unspecified atrial fibrillation: Secondary | ICD-10-CM | POA: Diagnosis not present

## 2020-09-16 LAB — POCT INR: INR: 2.7 (ref 2.0–3.0)

## 2020-09-16 NOTE — Patient Instructions (Signed)
Continue taking 1/2 tablet daily except 1 tablet each Sunday and  Repeat INR in 4 weeks

## 2020-09-16 NOTE — Patient Instructions (Signed)
Your physician has requested that you have a cardiac MRI. Cardiac MRI uses a computer to create images of your heart as its beating, producing both still and moving pictures of your heart and major blood vessels. For further information please visit [email or web address] Please follow the instruction sheet given to you today for more information.   Please call our office in June 2022 to schedule your follow up appointment  If you have any questions or concerns before your next appointment please send [place] a message through [place] or call our office at [phone].    TO LEAVE A MESSAGE FOR THE NURSE SELECT OPTION 2, PLEASE LEAVE A MESSAGE INCLUDING: . YOUR NAME . DATE OF BIRTH . CALL BACK NUMBER . REASON FOR CALL**this is important as we prioritize the call backs  YOU WILL RECEIVE A CALL BACK THE SAME DAY AS LONG AS YOU CALL BEFORE 4:00 PM

## 2020-09-20 NOTE — Addendum Note (Signed)
Encounter addended by: Jolaine Artist, MD on: 09/20/2020 10:34 PM  Actions taken: Level of Service modified, Visit diagnoses modified

## 2020-09-21 ENCOUNTER — Telehealth: Payer: Self-pay

## 2020-09-21 ENCOUNTER — Telehealth: Payer: Self-pay | Admitting: Cardiovascular Disease

## 2020-09-21 NOTE — Telephone Encounter (Signed)
I spoke to the patient who is not certain if she took her Warfarin on Sunday evening.  I advised her to take only an extra 1/2 tablet tonight, because she's not sure.  She verbalized understanding.

## 2020-09-21 NOTE — Telephone Encounter (Signed)
Pt c/o medication issue:  1. Name of Medication:   warfarin (COUMADIN) 5 MG tablet    2. How are you currently taking this medication (dosage and times per day)? Half a tablet Mon Wed Fri, 1 tablet other days  3. Are you having a reaction (difficulty breathing--STAT)? no  4. What is your medication issue? Patient states she cannot remember if she took her medication last night. She states she was supposed to take a whole one last night and is due to take half a tablet tonight. Please advise.

## 2020-09-21 NOTE — Telephone Encounter (Signed)
I received a call from [place]ak at [facility] in regards to pt. Vyndamax.  They are trying to reach pt with further direction.    I then called daughter, Rodena Piety [phone] and informed her that the patient needs to call [phone] to further discuss.

## 2020-09-23 ENCOUNTER — Other Ambulatory Visit: Payer: Self-pay | Admitting: Family Medicine

## 2020-09-23 MED ORDER — ATORVASTATIN CALCIUM 20 MG PO TABS: 20.0000 mg | ORAL_TABLET | Freq: Every day | ORAL | 2 refills | Status: DC

## 2020-09-23 MED ORDER — CYCLOBENZAPRINE HCL 5 MG PO TABS
5.0000 mg | ORAL_TABLET | Freq: Every day | ORAL | 0 refills | Status: DC | PRN
Start: 2020-09-23 — End: 2020-09-30

## 2020-09-23 MED ORDER — MAGNESIUM OXIDE 400 (241.3 MG) MG PO TABS
1.0000 | ORAL_TABLET | Freq: Every day | ORAL | 0 refills | Status: DC
Start: 2020-09-23 — End: 2020-12-09

## 2020-09-23 NOTE — Telephone Encounter (Signed)
Rx's sent in as requested. 

## 2020-09-23 NOTE — Telephone Encounter (Signed)
Pt is calling in to get several Rx's refilled due to being out of them Rx atorvastatin (LIPITOR) 20 MG, magnesium oxide (MAG-OX) 400 MG and cyclobenzaprine (FLEXERIL) 5 MG   Pharm:  [facility] [address] in [place], [place]

## 2020-09-25 ENCOUNTER — Telehealth: Payer: Self-pay | Admitting: Cardiovascular Disease

## 2020-09-25 NOTE — Telephone Encounter (Signed)
Patient is requesting instructions on how to take a specific medication, but she does not remember the name of it. She states the prescription is being mailed to her home address.

## 2020-09-25 NOTE — Telephone Encounter (Signed)
Vyndamax Tafamidis 61 MG CAPS  Spoke with patient about medication - once daily dose per chart  She states she has been in touch with [facility] and they are shipping medication to her house

## 2020-09-25 NOTE — Telephone Encounter (Signed)
Awesome!  -W

## 2020-09-29 DIAGNOSIS — N1832 Chronic kidney disease, stage 3b: Secondary | ICD-10-CM | POA: Diagnosis not present

## 2020-09-30 ENCOUNTER — Ambulatory Visit (INDEPENDENT_AMBULATORY_CARE_PROVIDER_SITE_OTHER): Payer: Medicare Other | Admitting: Family Medicine

## 2020-09-30 ENCOUNTER — Other Ambulatory Visit: Payer: Self-pay

## 2020-09-30 ENCOUNTER — Ambulatory Visit (INDEPENDENT_AMBULATORY_CARE_PROVIDER_SITE_OTHER): Payer: Medicare Other

## 2020-09-30 ENCOUNTER — Encounter: Payer: Self-pay | Admitting: Family Medicine

## 2020-09-30 VITALS — BP 124/76 | HR 88 | Temp 98.2°F | Resp 16 | Ht 61.0 in | Wt 213.8 lb

## 2020-09-30 DIAGNOSIS — M542 Cervicalgia: Secondary | ICD-10-CM | POA: Diagnosis not present

## 2020-09-30 DIAGNOSIS — R222 Localized swelling, mass and lump, trunk: Secondary | ICD-10-CM

## 2020-09-30 DIAGNOSIS — M25512 Pain in left shoulder: Secondary | ICD-10-CM

## 2020-09-30 DIAGNOSIS — M19012 Primary osteoarthritis, left shoulder: Secondary | ICD-10-CM | POA: Diagnosis not present

## 2020-09-30 NOTE — Patient Instructions (Signed)
A few things to remember from today's visit:   Cervicalgia - Plan: Ambulatory referral to Physical Therapy  Sternoclavicular joint pain, left - Plan: DG Clavicle Left  Left shoulder pain, unspecified chronicity  If you need refills please call your pharmacy. Do not use My Chart to request refills or for acute issues that need immediate attention.   PT will be arranged. Topical Salonpas. Tylenol 500 mg 3-4 times per day.  HAPPY BIRTHDAY!!!!!  Please be sure medication list is accurate. If a new problem present, please set up appointment sooner than planned today.

## 2020-09-30 NOTE — Progress Notes (Signed)
Chief Complaint  Patient presents with  . Shoulder Pain   HPI: Regina [name] is a 79 y.o. female, who is here with her daughter today with above complaint. She is reporting problem as new. Problem has been going on for 2 to 3 weeks.  Left anterior shoulder pain, left-sided upper chest pain, and cervical pain. Pain starts in left side of neck and radiates to upper back,shoulder,and left infraclavicular area.  Pain is exacerbated by movement. No history of trauma. She has not noted numbness, tingling, or burning sensation.  "Knot" in left side upper chest, this was noted a few days ago, not sure when this started.  Pain was initially intermittent, constant for the past 4 to 5 days. "Real sharp" pain, 7-8/10, "bad spasms." Her daughter tied to rub some medication, which aggravated pain. She has taking Tylenol.  Negative for fever, chills, change in appetite, palpitation, dyspnea, diaphoresis, abdominal pain, N/V, or a skin rash.  She was concerned about cardiac etiology but recently evaluated by her cardiologist, who did not think it was cardiac. Lexiscan stress test on 08/14/2020 - for ischemia, LVEF 29%, diffuse hypokinesis, LV is dilated.  Cervical MRI 05/2013: Central disc extrusion C3-4 with cord compression; canal diameter 4 mm. Abnormal cord signal is suspected in the right hemicord.   Review of Systems  Constitutional: Negative for activity change and chills.  Respiratory: Negative for cough and wheezing.   Musculoskeletal: Positive for arthralgias. Negative for joint swelling.  Skin: Negative for color change and pallor.  Neurological: Negative for weakness and headaches.  Rest see pertinent positives and negatives per HPI.  Current Outpatient Medications on File Prior to Visit  Medication Sig Dispense Refill  . Alcohol Swabs (B-D SINGLE USE SWABS REGULAR) PADS Use to test blood sugar up to 3 times daily 100 each 3  . allopurinol (ZYLOPRIM) 100 MG tablet  Take 1 tablet (100 mg total) by mouth daily. 30 tablet 3  . Ascorbic Acid (VITAMIN C) 1000 MG tablet Take 1,000 mg by mouth daily.    Marland Kitchen atorvastatin (LIPITOR) 20 MG tablet Take 1 tablet (20 mg total) by mouth daily. 90 tablet 2  . Blood Glucose Monitoring Suppl (ACCU-CHEK GUIDE) w/Device KIT 1 Device by Does not apply route 3 (three) times daily. 1 kit 0  . Cholecalciferol (VITAMIN D3) 50 MCG (2000 UT) capsule Take 2,000 Units by mouth daily.    . clotrimazole-betamethasone (LOTRISONE) cream Apply 1 application topically 2 (two) times daily as needed. 30 g 1  . Continuous Blood Gluc Sensor (FREESTYLE LIBRE 2 SENSOR) MISC 1 Device by Does not apply route as directed. 2 each 6  . diclofenac Sodium (VOLTAREN) 1 % GEL Apply 1 application topically daily as needed (arthritis pain/hands).    . famotidine (PEPCID) 20 MG tablet Take 20 mg by mouth at bedtime.    . fluticasone (FLONASE) 50 MCG/ACT nasal spray Place 1 spray into both nostrils daily.     Marland Kitchen gabapentin (NEURONTIN) 600 MG tablet Take 1 tablet by mouth twice daily 60 tablet 2  . glucose blood (ACCU-CHEK GUIDE) test strip Use to test blood sugar up to 3 times daily 100 each 3  . insulin glargine, 1 Unit Dial, (TOUJEO SOLOSTAR) 300 UNIT/ML Solostar Pen Inject 26 Units into the skin daily. Eat a snack with protein nightly before bedtime. 15 mL 6  . insulin lispro (HUMALOG KWIKPEN) 100 UNIT/ML KwikPen Max daily 15 units 15 mL 11  . Insulin Pen Needle 32G X 4 MM  MISC 1 Device by Does not apply route in the morning, at noon, in the evening, and at bedtime. 150 each 6  . Iron-Vitamins (GERITOL COMPLETE PO) Take 1 tablet by mouth daily.    Marland Kitchen lactulose (CHRONULAC) 10 GM/15ML solution Take 20 g by mouth daily as needed for mild constipation.     . Lancets Misc. (ACCU-CHEK FASTCLIX LANCET) KIT Use to test blood sugar up to 3 times daily 1 kit 1  . levothyroxine (SYNTHROID) 150 MCG tablet Take 1 tablet (150 mcg total) by mouth daily before breakfast. 90  tablet 2  . loratadine (CLARITIN) 10 MG tablet Take 1 tablet (10 mg total) by mouth daily as needed. (Patient taking differently: Take 10 mg by mouth daily as needed for allergies. ) 90 tablet 0  . magnesium oxide (MAG-OX) 400 (241.3 Mg) MG tablet Take 1 tablet (400 mg total) by mouth daily. 90 tablet 0  . meclizine (ANTIVERT) 25 MG tablet Take 25 mg by mouth daily as needed for dizziness.     . metoprolol tartrate (LOPRESSOR) 100 MG tablet Take 0.5 tablets (50 mg total) by mouth 2 (two) times daily. 60 tablet 3  . nitroGLYCERIN (NITROSTAT) 0.4 MG SL tablet Place 1 tablet (0.4 mg total) under the tongue every 5 (five) minutes as needed for chest pain. 25 tablet 2  . Omega-3 Fatty Acids (FISH OIL) 1000 MG CAPS Take 2,000 mg by mouth daily.     . polyethylene glycol (MIRALAX / GLYCOLAX) 17 g packet Take 17 g by mouth daily as needed for mild constipation. 14 each 0  . Propylene Glycol (SYSTANE BALANCE OP) Place 1 drop into both eyes daily as needed (for dry eyes).    . Tafamidis 61 MG CAPS Take 61 mg by mouth daily. 30 capsule 6  . torsemide (DEMADEX) 20 MG tablet Take 2 tablets ( 40 mg ) daily 180 tablet 3  . warfarin (COUMADIN) 5 MG tablet Take 1 tablet (5 mg total) by mouth daily. (Patient taking differently: Take 2.5 mg by mouth See admin instructions. Take 2.5 mg Mon., Wed., Fri. Take 5 mg all the other days at bedtime) 90 tablet 3   No current facility-administered medications on file prior to visit.   Past Medical History:  Diagnosis Date  . (HFpEF) heart failure with preserved ejection fraction ([place])   . Back pain   . 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 Diabetes mellitus without complication ([place])   . Dyspnea   . GERD (gastroesophageal reflux disease)   . H/O echocardiogram    a. 01/2015 Echo: EF 55-60%, Gr 2 DD, mod LVH, mildly dil LA.  Marland Kitchen Heart disease   . Hypertension   . Hypothyroidism   . Joint pain   . Kidney problem   .  Lower extremity edema   . Mini stroke ([place])   . Non-obstructive CAD    a. 01/2015 Cardiolite: + inf wall ischemia, EF 56%;  b. 01/2015 Cath: LM nl, LAD 61m LCX min irregs, RCA dominant, 50-634m . Sleep apnea   . Swallowing difficulty   . Thyroid disease    Allergies  Allergen Reactions  . Ace Inhibitors Other (See Comments)    unknown  . Amlodipine Other (See Comments)    unknown  . Atenolol Other (See Comments)    bradycardia  . Avandia [Rosiglitazone] Other (See Comments)    unknown  . Darvon [Propoxyphene] Other (See Comments)  unknown  . Erythromycin Itching  . Hydralazine Other (See Comments)    Burning in throat and chest  . Hydrocodone Other (See Comments)    Hallucinations.  . Levofloxacin Itching  . Morphine And Related Other (See Comments)    Dizzy and hallucianation, vomiting; Willing to try low dose  . Percocet [Oxycodone-Acetaminophen] Other (See Comments)    hallucination  . Spironolactone Other (See Comments)    unknown  . Tramadol Other (See Comments)    Unknown/does not recall reaction but does not want to take again    Social History   Socioeconomic History  . Marital status: Widowed    Spouse name: Not on file  . Number of children: Not on file  . Years of education: Not on file  . Highest education level: Not on file  Occupational History  . Occupation: Retired  Tobacco Use  . Smoking status: Never Smoker  . Smokeless tobacco: Never Used  Vaping Use  . Vaping Use: Never used  Substance and Sexual Activity  . Alcohol use: No  . Drug use: Not on file  . Sexual activity: Not Currently  Other Topics Concern  . Not on file  Social History Narrative   Lives with daughter   Social Determinants of Health   Financial Resource Strain: Not on file  Food Insecurity: Not on file  Transportation Needs: Not on file  Physical Activity: Not on file  Stress: Not on file  Social Connections: Not on file    Vitals:   09/30/20 1521 09/30/20  1622  BP: 124/76   Pulse: (!) 125 88  Resp: 16   Temp: 98.2 F (36.8 C)   SpO2: 91%    Body mass index is 40.4 kg/m.  Physical Exam Vitals and nursing note reviewed.  Constitutional:      General: She is not in acute distress.    Appearance: She is well-developed. She is not ill-appearing.  HENT:     Head: Normocephalic and atraumatic.     Mouth/Throat:     Mouth: Mucous membranes are moist.     Pharynx: Oropharynx is clear.  Eyes:     Conjunctiva/sclera: Conjunctivae normal.  Cardiovascular:     Rate and Rhythm: Normal rate. Rhythm irregular.  Pulmonary:     Effort: Pulmonary effort is normal. No respiratory distress.  Chest:       Comments:    Musculoskeletal:        General: No edema.     Left shoulder: Tenderness (Pain with movement.) present. No deformity or effusion. Decreased range of motion (Mild, active.).     Cervical back: Spasms present. No tenderness or bony tenderness. Decreased range of motion.       Back:     Comments: Left shoulder ROM elicits left upper back pain.  Skin:    General: Skin is warm.     Findings: No erythema or rash.  Neurological:     General: No focal deficit present.     Mental Status: She is alert and oriented to person, place, and time.     Deep Tendon Reflexes: Strength normal.  Psychiatric:        Mood and Affect: Mood is anxious.   ASSESSMENT AND PLAN:  [name] was seen today for shoulder pain.  Diagnoses and all orders for this visit: Orders Placed This Encounter  Procedures  . DG Clavicle Left  . Ambulatory referral to Physical Therapy   Left shoulder pain, unspecified chronicity We discussed possible etiologies. ?  Radicular pain, OA, and rotator cuff tendinitis among some. I do not think imaging needed at this time. PT recommended.  Cervicalgia She agrees with trying PT. Local massage and Salonpas may help. Tylenol 500 mg 3-4 times per day if needed.  If problem is not improved with PT, Ortho referral  will be considered.  Sternoclavicular joint pain, left Most likely OA. Further recommendation according to imaging results.  Chest wall mass Questionable for lipoma. She agrees with continue monitoring area, if growth is noted, we could arrange surgical evaluation.  Return if symptoms worsen or fail to improve, for Keep next appt.   Doreather Hoxworth G. [name], MD  [facility]. [place] office.   A few things to remember from today's visit:   Cervicalgia - Plan: Ambulatory referral to Physical Therapy  Sternoclavicular joint pain, left - Plan: DG Clavicle Left  Left shoulder pain, unspecified chronicity  If you need refills please call your pharmacy. Do not use My Chart to request refills or for acute issues that need immediate attention.   PT will be arranged. Topical Salonpas. Tylenol 500 mg 3-4 times per day.  HAPPY BIRTHDAY!!!!!  Please be sure medication list is accurate. If a new problem present, please set up appointment sooner than planned today.

## 2020-10-07 ENCOUNTER — Encounter: Payer: Self-pay | Admitting: Physical Therapy

## 2020-10-07 ENCOUNTER — Other Ambulatory Visit: Payer: Self-pay

## 2020-10-07 ENCOUNTER — Ambulatory Visit: Payer: Medicare Other | Attending: Family Medicine | Admitting: Physical Therapy

## 2020-10-07 DIAGNOSIS — M25512 Pain in left shoulder: Secondary | ICD-10-CM | POA: Diagnosis not present

## 2020-10-07 DIAGNOSIS — M5412 Radiculopathy, cervical region: Secondary | ICD-10-CM | POA: Insufficient documentation

## 2020-10-07 DIAGNOSIS — M6281 Muscle weakness (generalized): Secondary | ICD-10-CM | POA: Diagnosis not present

## 2020-10-07 DIAGNOSIS — G8929 Other chronic pain: Secondary | ICD-10-CM | POA: Insufficient documentation

## 2020-10-08 ENCOUNTER — Encounter: Payer: Self-pay | Admitting: Physical Therapy

## 2020-10-08 NOTE — Patient Instructions (Signed)
Access Code: [email or web address] URL: [email or web address] Date: 10/08/2020 Prepared by: Carolyne Littles  Program Notes   Thera-Cane      Exercises .Seated Upper Trapezius Stretch - 1 x daily - 7 x weekly - 1 sets - 3 reps - 20 hold .Seated Scapular Retraction - 1 x daily - 7 x weekly - 2 sets - 10 reps

## 2020-10-08 NOTE — Addendum Note (Signed)
Addended by: Carney Living on: 10/08/2020 11:29 AM   Modules accepted: Orders

## 2020-10-08 NOTE — Therapy (Signed)
[place], [place], [address] Phone: [phone]   Fax:  [phone]  Physical Therapy Evaluation  Patient Details  Name: Regina [name] MRN: [id number] Date of Birth: 12-08-1940 Referring Provider (PT): Dr Inez Catalina [name]   Encounter Date: 10/07/2020   PT End of Session - 10/07/20 1606    Visit Number 1    Number of Visits 12    Date for PT Re-Evaluation 11/18/20    Authorization Type uhc mcr    PT Start Time 3007    PT Stop Time 1628    PT Time Calculation (min) 43 min    Activity Tolerance Patient tolerated treatment well    Behavior During Therapy [facility] for tasks assessed/performed           Past Medical History:  Diagnosis Date  . (HFpEF) heart failure with preserved ejection fraction ([place])   . Back pain   . 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 Diabetes mellitus without complication ([place])   . Dyspnea   . GERD (gastroesophageal reflux disease)   . H/O echocardiogram    a. 01/2015 Echo: EF 55-60%, Gr 2 DD, mod LVH, mildly dil LA.  Marland Kitchen Heart disease   . Hypertension   . Hypothyroidism   . Joint pain   . Kidney problem   . Lower extremity edema   . Mini stroke ([place])   . Non-obstructive CAD    a. 01/2015 Cardiolite: + inf wall ischemia, EF 56%;  b. 01/2015 Cath: LM nl, LAD 30m, LCX min irregs, RCA dominant, 50-61m.  . Sleep apnea   . Swallowing difficulty   . Thyroid disease     Past Surgical History:  Procedure Laterality Date  . ABDOMINAL HYSTERECTOMY    . BUBBLE STUDY  07/01/2020   Procedure: BUBBLE STUDY;  Surgeon: Elouise Munroe, MD;  Location: [place];  Service: Cardiovascular;;  . BUBBLE STUDY  08/05/2020   Procedure: BUBBLE STUDY;  Surgeon: Geralynn Rile, MD;  Location: [place];  Service: Cardiovascular;;  done with definity   . CARDIAC CATHETERIZATION    . ESOPHAGOGASTRODUODENOSCOPY     a. 01/2015 EGD:  patent esophagus.  . ESOPHAGOGASTRODUODENOSCOPY N/A 01/20/2015   Procedure: ESOPHAGOGASTRODUODENOSCOPY (EGD);  Surgeon: Carol Ada, MD;  Location: [facility] ENDOSCOPY;  Service: Endoscopy;  Laterality: N/A;  . HAND SURGERY    . JOINT REPLACEMENT     reports history bilateral TKA and right TSA  . LEFT HEART CATHETERIZATION WITH CORONARY ANGIOGRAM N/A 01/19/2015  . TEE WITHOUT CARDIOVERSION  05/04/2020  . TEE WITHOUT CARDIOVERSION N/A 05/05/2020   Procedure: TRANSESOPHAGEAL ECHOCARDIOGRAM (TEE);  Surgeon: Pixie Casino, MD;  Location: [facility] ENDOSCOPY;  Service: Cardiovascular;  Laterality: N/A;  . TEE WITHOUT CARDIOVERSION N/A 07/01/2020   Procedure: TRANSESOPHAGEAL ECHOCARDIOGRAM (TEE);  Surgeon: Elouise Munroe, MD;  Location: [facility] ENDOSCOPY;  Service: Cardiovascular;  Laterality: N/A;  . TEE WITHOUT CARDIOVERSION N/A 08/05/2020   Procedure: TRANSESOPHAGEAL ECHOCARDIOGRAM (TEE);  Surgeon: Geralynn Rile, MD;  Location: [place];  Service: Cardiovascular;  Laterality: N/A;  . THYROIDECTOMY      There were no vitals filed for this visit.    Subjective Assessment - 10/07/20 1550    Subjective Patient has had increased neck and shoulder on the left side for several months. the pain can rdiate into her anterior cchest. the pain is severe at times. The pain cxan radiate halfway down her shoulder. She can  not rember anything happending that causes the pain, itjust comes and goes.    Currently in Pain? Yes    Pain Score 4    can reach around a 10/10   Pain Location Neck    Pain Orientation Left    Pain Descriptors / Indicators Aching    Pain Type Chronic pain    Pain Radiating Towards down into her mud upper arm    Pain Onset More than a month ago    Pain Frequency Constant    Aggravating Factors  can feel the pain when she uses her arm    Pain Relieving Factors ice and head; volatarin, lying on the other side    Effect of Pain on Daily Activities reviewed HEp and symptom mangement               [facility] PT Assessment - 10/08/20 0001      Assessment   Medical Diagnosis Left Cervical and Shoulder Pain    Referring Provider (PT) Dr Inez Catalina [name]    Onset Date/Surgical Date --   > 6 MONTHS AGO   Hand Dominance Right    Next MD Visit Nothing schedueld    Prior Therapy For lower Back      Precautions   Precautions None    Precaution Comments A-fib, and small blood clots in her heart      Restrictions   Weight Bearing Restrictions No      Balance Screen   Has the patient fallen in the past 6 months Yes    How many times? --   Missed a step   Has the patient had a decrease in activity level because of a fear of falling?  No    Is the patient reluctant to leave their home because of a fear of falling?  No      [place]      Prior Function   Level of Independence Independent    Vocation Retired    Leisure limited by [employer]   Overall Cognitive Status Within Functional Limits for tasks assessed    Attention Focused    Memory Appears intact    Awareness Appears intact    Problem Solving Appears intact      Observation/Other Assessments   Focus on Therapeutic Outcomes (FOTO)  67% restircition 49% expected      Sensation   Light Touch Appears Intact    Additional Comments pain radiating into mid left arm      Coordination   Gross Motor Movements are Fluid and Coordinated Yes    Fine Motor Movements are Fluid and Coordinated Yes      Posture/Postural Control   Posture Comments kypphotic; rounded shoulder, forward head      AROM   Left Shoulder Flexion 120 Degrees   with pain   Left Shoulder Internal Rotation --   pain ful to lateral gluteal   Left Shoulder External Rotation --   able to reach the back of her head   Cervical Flexion 14    Cervical Extension 12    Cervical - Right Rotation 46    Cervical - Left Rotation 55      Strength   Strength  Assessment Site Shoulder    Right/Left Shoulder Left    Left Shoulder Flexion 3+/5    Left Shoulder Internal Rotation 3+/5    Left Shoulder External Rotation  4+/5      Palpation   Palpation comment signifanct spasming in upper trap and cervical paraspinals;  tightness and pain alonge her collar bone      Special Tests   Other special tests hawkins (-) empty can test (-)                      Objective measurements completed on examination: See above findings.       [place] Adult PT Treatment/Exercise - 10/08/20 0001      Exercises   Exercises Neck      Neck Exercises: Seated   Other Seated Exercise scap retraction x15      Neck Exercises: Stretches   Upper Trapezius Stretch Limitations 2x20 sec ond hold for left    Other Neck Stretches reviewed use of the thera-cane                  PT Education - 10/07/20 1606    Education Details HEP, symptom management    Person(s) Educated Patient    Methods Explanation;Demonstration;Tactile cues;Verbal cues    Comprehension Returned demonstration;Verbalized understanding;Verbal cues required;Tactile cues required            PT Short Term Goals - 10/08/20 1116      PT SHORT TERM GOAL #1   Title Patient will increase cervical flexion and extension by 10 degrees    Time 3    Period Weeks    Status New    Target Date 10/29/20      PT SHORT TERM GOAL #2   Title Patient will increase bilateral cervical rotation by 10 degrees    Time 3    Period Weeks    Status New    Target Date 10/29/20      PT SHORT TERM GOAL #3   Title Patient will report no pain radiating down into her arm    Time 3    Period Weeks    Status New    Target Date 10/29/20      PT SHORT TERM GOAL #4   Title Therapy will review FOTO results with patient    Time 1    Period Weeks    Status New    Target Date 10/15/20             PT Long Term Goals - 10/08/20 1118      PT LONG TERM GOAL #1   Title Patient will demonstrate  150 degrees of shoulder flexion    Time 6    Period Weeks    Status On-going      PT LONG TERM GOAL #2   Title Patient will use her left arm for ADL's without increased pain    Time 6    Period Weeks    Status New    Target Date 11/19/20                  Plan - 10/07/20 1609    Clinical Impression Statement Patient is a 79 year old female with left cervical and shoulder pain. Signs and symptoms are consistent with cervical radiculopathy. An MRI from 2014 shows C3-C4 disc buldge. She has limited cervical movement most limited in flexion and extension. She has left UE gross strength deficits and decreased active ROM of the shoulder. She has an increased kyphosis and rounded shoulders. She would benefit from skilled therapy to decreased spasming and improve left shoulder function.    Personal Factors and Comorbidities Age;Comorbidity  1;Comorbidity 2;Comorbidity 3+;Fitness    Comorbidities Right shoulder replacement, A-fib, Right shoulder OA    Examination-Activity Limitations Reach Overhead;Carry;Dressing;Locomotion Level;Bathing    Examination-Participation Restrictions Cleaning;Meal Prep;Community Activity;Medication Management;Shop    Stability/Clinical Decision Making Evolving/Moderate complexity    Clinical Decision Making Moderate    Rehab Potential Good    PT Frequency 2x / week    PT Duration 6 weeks    PT Treatment/Interventions ADLs/Self Care Home Management;Cryotherapy;Iontophoresis 4mg /ml Dexamethasone;Ultrasound;DME Instruction;Functional mobility training;Therapeutic activities;Therapeutic exercise;Neuromuscular re-education;Patient/family education;Manual techniques;Dry needling;Taping;Passive range of motion    PT Next Visit Plan Patient was not put in a supine position 2nd to time; If she can tolerate consder very light manual traction and sub-occipital release; consider eated light postrual correction exercises or staning scap retraction and extension for HEP;    PT  Home Exercise Plan Access Code: [email or web address]  URL: [email or web address]  Date: 10/08/2020  Prepared by: Carolyne Littles    Program Notes      Thera-Cane           Exercises  .Seated Upper Trapezius Stretch - 1 x daily - 7 x weekly - 1 sets - 3 reps - 20 hold  .Seated Scapular Retraction - 1 x daily - 7 x weekly - 2 sets - 10 reps    Consulted and Agree with Plan of Care Patient           Patient will benefit from skilled therapeutic intervention in order to improve the following deficits and impairments:  Pain,Decreased strength,Decreased activity tolerance,Increased muscle spasms,Decreased endurance,Decreased range of motion,Increased fascial restricitons,Decreased mobility  Visit Diagnosis: Radiculopathy, cervical region  Chronic left shoulder pain  Muscle weakness (generalized)     Problem List Patient Active Problem List   Diagnosis Date Noted  . Type 2 diabetes mellitus with stage 3a chronic kidney disease, with long-term current use of insulin ([place]) 09/14/2020  . Type 2 diabetes mellitus with retinopathy, with long-term current use of insulin ([place]) 09/14/2020  . Diabetes mellitus ([place]) 09/14/2020  . Type 2 diabetes mellitus with diabetic polyneuropathy, with long-term current use of insulin ([place]) 09/14/2020  . Long term (current) use of anticoagulants 07/13/2020  . Persistent atrial fibrillation ([place])   . Mixed hyperlipidemia   . Chronic diastolic heart failure ([place])   . Demand ischemia ([place])   . Atrial fibrillation ([place]) 04/24/2020  . Other fatigue 03/24/2020  . Short of breath on exertion 03/24/2020  . Congestive heart failure ([place]) 03/24/2020  . Vitamin D deficiency 03/24/2020  . Sleep apnea 05/21/2019  . History of hepatitis C 05/21/2019  . Insomnia 05/21/2019  . CKD (chronic kidney disease), stage III ([place]) 05/21/2019  . GERD (gastroesophageal reflux disease) 04/20/2018  . Type 2 diabetes mellitus with diabetic neuropathy, unspecified ([place]) 12/19/2016  .  History of TIA (transient ischemic attack) 12/19/2016  . Hyperlipidemia associated with type 2 diabetes mellitus ([place]) 12/19/2016  . S/P total knee replacement using cement, right 03/18/2015  . Non-obstructive CAD   . Cardiovascular stress test abnormal   . Pleuritic chest pain 01/16/2015  . Acute renal failure superimposed on stage 3 chronic kidney disease ([place]) 01/16/2015  . Obesity, Class III, BMI 40-49.9 (morbid obesity) ([place]) 01/16/2015  . Essential hypertension 01/16/2015  . Hypothyroidism 01/16/2015  . OSA on CPAP 01/16/2015  . DM type 2 (diabetes mellitus, type 2) ([place]) 01/16/2015  . Coronary artery disease involving native coronary artery without angina pectoris 10/02/2014  . Physical deconditioning 02/28/2013  . Class 3 severe obesity with serious comorbidity and body mass  index (BMI) of 40.0 to 44.9 in adult [facility]) 02/27/2013  . Osteoarthritis 02/27/2013    Carney Living PT DPT 10/08/2020, 11:26 AM  [facility] [address] [place], [place], [address] Phone: [phone]   Fax:  [phone]  Name: JAQUELYNE FIRKUS MRN: [id number] Date of Birth: Feb 10, 1941

## 2020-10-14 ENCOUNTER — Ambulatory Visit (INDEPENDENT_AMBULATORY_CARE_PROVIDER_SITE_OTHER): Payer: Medicare Other

## 2020-10-14 ENCOUNTER — Other Ambulatory Visit: Payer: Self-pay

## 2020-10-14 DIAGNOSIS — I4819 Other persistent atrial fibrillation: Secondary | ICD-10-CM

## 2020-10-14 DIAGNOSIS — Z7901 Long term (current) use of anticoagulants: Secondary | ICD-10-CM | POA: Diagnosis not present

## 2020-10-14 LAB — POCT INR: INR: 2.7 (ref 2.0–3.0)

## 2020-10-14 NOTE — Patient Instructions (Signed)
Continue taking 1/2 tablet daily except 1 tablet each Sunday and  Repeat INR in 4 weeks

## 2020-10-21 ENCOUNTER — Telehealth (HOSPITAL_COMMUNITY): Payer: Self-pay | Admitting: Emergency Medicine

## 2020-10-21 NOTE — Telephone Encounter (Signed)
Reaching out to patient to offer assistance regarding upcoming cardiac imaging study; pt verbalizes understanding of appt date/time, parking situation and where to check in, and verified current allergies; name and call back number provided for further questions should they arise Marchia Bond RN Navigator Cardiac Imaging [facility] [phone] office [phone] cell   Pt reports orthopedic implants such as joint replacements, denies other metals  Pt reports some claustro but has tolerated scans in the past (encouraged a PO benadryl if needed- her daughter is driving)  Clarise [name]

## 2020-10-23 ENCOUNTER — Ambulatory Visit (HOSPITAL_COMMUNITY)
Admission: RE | Admit: 2020-10-23 | Discharge: 2020-10-23 | Disposition: A | Discharge status: Home / Self Care | Payer: Medicare Other | Source: Ambulatory Visit | Attending: Internal Medicine | Admitting: Internal Medicine

## 2020-10-23 ENCOUNTER — Other Ambulatory Visit: Payer: Self-pay

## 2020-10-23 ENCOUNTER — Ambulatory Visit: Payer: Medicare Other

## 2020-10-23 DIAGNOSIS — R079 Chest pain, unspecified: Secondary | ICD-10-CM | POA: Diagnosis not present

## 2020-10-23 DIAGNOSIS — I5022 Chronic systolic (congestive) heart failure: Secondary | ICD-10-CM | POA: Diagnosis not present

## 2020-10-23 MED ORDER — GADOBUTROL 1 MMOL/ML IV SOLN
10.0000 mL | Freq: Once | INTRAVENOUS | Status: AC | PRN
  Administered 2020-10-23: 10 mL via INTRAVENOUS

## 2020-10-27 ENCOUNTER — Other Ambulatory Visit: Payer: Self-pay

## 2020-10-27 ENCOUNTER — Ambulatory Visit: Payer: Medicare Other | Attending: Family Medicine

## 2020-10-27 DIAGNOSIS — M6281 Muscle weakness (generalized): Secondary | ICD-10-CM | POA: Diagnosis not present

## 2020-10-27 DIAGNOSIS — G8929 Other chronic pain: Secondary | ICD-10-CM | POA: Insufficient documentation

## 2020-10-27 DIAGNOSIS — M545 Low back pain, unspecified: Secondary | ICD-10-CM | POA: Diagnosis not present

## 2020-10-27 DIAGNOSIS — M25512 Pain in left shoulder: Secondary | ICD-10-CM | POA: Insufficient documentation

## 2020-10-27 DIAGNOSIS — M5412 Radiculopathy, cervical region: Secondary | ICD-10-CM

## 2020-10-27 NOTE — Therapy (Signed)
[place], [place], [address] Phone: [phone]   Fax:  [phone]  Physical Therapy Treatment  Patient Details  Name: Regina [name] MRN: [id number] Date of Birth: [birth year] Referring Provider (PT): Dr Inez Catalina [name]   Encounter Date: 10/27/2020   PT End of Session - 10/27/20 1532    Visit Number 2    Number of Visits 12    Date for PT Re-Evaluation 11/18/20    Authorization Type uhc mcr    PT Start Time 1532    PT Stop Time 1615    PT Time Calculation (min) 43 min    Activity Tolerance Patient tolerated treatment well    Behavior During Therapy [facility] for tasks assessed/performed           Past Medical History:  Diagnosis Date  . (HFpEF) heart failure with preserved ejection fraction ([place])   . Back pain   . 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 Diabetes mellitus without complication ([place])   . Dyspnea   . GERD (gastroesophageal reflux disease)   . H/O echocardiogram    a. 01/2015 Echo: EF 55-60%, Gr 2 DD, mod LVH, mildly dil LA.  Marland Kitchen Heart disease   . Hypertension   . Hypothyroidism   . Joint pain   . Kidney problem   . Lower extremity edema   . Mini stroke ([place])   . Non-obstructive CAD    a. 01/2015 Cardiolite: + inf wall ischemia, EF 56%;  b. 01/2015 Cath: LM nl, LAD 41m, LCX min irregs, RCA dominant, 50-60m.  . Sleep apnea   . Swallowing difficulty   . Thyroid disease     Past Surgical History:  Procedure Laterality Date  . ABDOMINAL HYSTERECTOMY    . BUBBLE STUDY  07/01/2020   Procedure: BUBBLE STUDY;  Surgeon: Elouise Munroe, MD;  Location: [place];  Service: Cardiovascular;;  . BUBBLE STUDY  08/05/2020   Procedure: BUBBLE STUDY;  Surgeon: Geralynn Rile, MD;  Location: [place];  Service: Cardiovascular;;  done with definity   . CARDIAC CATHETERIZATION    . ESOPHAGOGASTRODUODENOSCOPY     a. 01/2015 EGD:  patent esophagus.  . ESOPHAGOGASTRODUODENOSCOPY N/A 01/20/2015   Procedure: ESOPHAGOGASTRODUODENOSCOPY (EGD);  Surgeon: Carol Ada, MD;  Location[facility] ENDOSCOPY;  Service: Endoscopy;  Laterality: N/A;  . HAND SURGERY    . JOINT REPLACEMENT     reports history bilateral TKA and right TSA  . LEFT HEART CATHETERIZATION WITH CORONARY ANGIOGRAM N/A 01/19/2015  . TEE WITHOUT CARDIOVERSION  05/04/2020  . TEE WITHOUT CARDIOVERSION N/A 05/05/2020   Procedure: TRANSESOPHAGEAL ECHOCARDIOGRAM (TEE);  Surgeon: Pixie Casino, MD;  Location: [facility] ENDOSCOPY;  Service: Cardiovascular;  Laterality: N/A;  . TEE WITHOUT CARDIOVERSION N/A 07/01/2020   Procedure: TRANSESOPHAGEAL ECHOCARDIOGRAM (TEE);  Surgeon: Elouise Munroe, MD;  Location: [facility] ENDOSCOPY;  Service: Cardiovascular;  Laterality: N/A;  . TEE WITHOUT CARDIOVERSION N/A 08/05/2020   Procedure: TRANSESOPHAGEAL ECHOCARDIOGRAM (TEE);  Surgeon: Geralynn Rile, MD;  Location: [place];  Service: Cardiovascular;  Laterality: N/A;  . THYROIDECTOMY      There were no vitals filed for this visit.   Subjective Assessment - 10/27/20 1542    Subjective Pt reports she is doing better with pain which is now intermittent and less intense. pt reports completing her exs consistently and using the theracane.    Currently in Pain? Yes    Pain Score 0-No pain  5/10 for the high range   Pain Location Neck    Pain Orientation Left    Pain Descriptors / Indicators Aching    Pain Type Chronic pain    Pain Radiating Towards to upper trap and less into her upper arm    Pain Onset More than a month ago                             [facility] Adult PT Treatment/Exercise - 10/27/20 0001      Exercises   Exercises Neck      Neck Exercises: Seated   Neck Retraction 10 reps;3 secs    Other Seated Exercise scap retraction 10x; 3 sec      Neck Exercises: Supine   Neck Retraction 10 reps;3 secs    Neck Retraction Limitations towel roll under head       Manual Therapy   Manual Therapy Manual Traction    Manual Traction Cervical traction and suboccipital release      Neck Exercises: Stretches   Upper Trapezius Stretch Limitations 3x20 second hold for left                  PT Education - 10/27/20 1600    Education Details Use of towel roll with towel for support of neck when sleeping and positioning to promote proper sleeping posture and improve comfort            PT Short Term Goals - 10/08/20 1116      PT SHORT TERM GOAL #1   Title Patient will increase cervical flexion and extension by 10 degrees    Time 3    Period Weeks    Status New    Target Date 10/29/20      PT SHORT TERM GOAL #2   Title Patient will increase bilateral cervical rotation by 10 degrees    Time 3    Period Weeks    Status New    Target Date 10/29/20      PT SHORT TERM GOAL #3   Title Patient will report no pain radiating down into her arm    Time 3    Period Weeks    Status New    Target Date 10/29/20      PT SHORT TERM GOAL #4   Title Therapy will review FOTO results with patient    Time 1    Period Weeks    Status New    Target Date 10/15/20             PT Long Term Goals - 10/08/20 1118      PT LONG TERM GOAL #1   Title Patient will demonstrate 150 degrees of shoulder flexion    Time 6    Period Weeks    Status On-going      PT LONG TERM GOAL #2   Title Patient will use her left arm for ADL's without increased pain    Time 6    Period Weeks    Status New    Target Date 11/19/20                 Plan - 10/27/20 2153    Clinical Impression Statement Pt responded well to the initial HEP for her neck. Today the HEP was reviewed and cervical retraction exs added to the HEP. Pt was instructed in proper sitting posture and in neck support and positions for sleeping. Pt returned  demonstration of these sleeping positions. Additionally, manual techniques of cervical traction and suboccipital release were provided  with pt reporting improved neck mobility.    Personal Factors and Comorbidities Age;Comorbidity 1;Comorbidity 2;Comorbidity 3+;Fitness    Comorbidities Right shoulder replacement, A-fib, Right shoulder OA    Examination-Activity Limitations Reach Overhead;Carry;Dressing;Locomotion Level;Bathing    Examination-Participation Restrictions Cleaning;Meal Prep;Community Activity;Medication Management;Shop    Stability/Clinical Decision Making Evolving/Moderate complexity    Clinical Decision Making Moderate    Rehab Potential Good    PT Frequency 2x / week    PT Duration 6 weeks    PT Treatment/Interventions ADLs/Self Care Home Management;Cryotherapy;Iontophoresis 4mg /ml Dexamethasone;Ultrasound;DME Instruction;Functional mobility training;Therapeutic activities;Therapeutic exercise;Neuromuscular re-education;Patient/family education;Manual techniques;Dry needling;Taping;Passive range of motion    PT Next Visit Plan Assess response to teatment session    Consulted and Agree with Plan of Care Patient           Patient will benefit from skilled therapeutic intervention in order to improve the following deficits and impairments:  Pain,Decreased strength,Decreased activity tolerance,Increased muscle spasms,Decreased endurance,Decreased range of motion,Increased fascial restricitons,Decreased mobility  Visit Diagnosis: Radiculopathy, cervical region  Chronic left shoulder pain  Muscle weakness (generalized)  Bilateral low back pain without sciatica, unspecified chronicity     Problem List Patient Active Problem List   Diagnosis Date Noted  . Type 2 diabetes mellitus with stage 3a chronic kidney disease, with long-term current use of insulin ([place]) 09/14/2020  . Type 2 diabetes mellitus with retinopathy, with long-term current use of insulin ([place]) 09/14/2020  . Diabetes mellitus ([place]) 09/14/2020  . Type 2 diabetes mellitus with diabetic polyneuropathy, with long-term current use of insulin  ([place]) 09/14/2020  . Long term (current) use of anticoagulants 07/13/2020  . Persistent atrial fibrillation ([place])   . Mixed hyperlipidemia   . Chronic diastolic heart failure ([place])   . Demand ischemia ([place])   . Atrial fibrillation ([place]) 04/24/2020  . Other fatigue 03/24/2020  . Short of breath on exertion 03/24/2020  . Congestive heart failure ([place]) 03/24/2020  . Vitamin D deficiency 03/24/2020  . Sleep apnea 05/21/2019  . History of hepatitis C 05/21/2019  . Insomnia 05/21/2019  . CKD (chronic kidney disease), stage III ([place]) 05/21/2019  . GERD (gastroesophageal reflux disease) 04/20/2018  . Type 2 diabetes mellitus with diabetic neuropathy, unspecified ([place]) 12/19/2016  . History of TIA (transient ischemic attack) 12/19/2016  . Hyperlipidemia associated with type 2 diabetes mellitus ([place]) 12/19/2016  . S/P total knee replacement using cement, right 03/18/2015  . Non-obstructive CAD   . Cardiovascular stress test abnormal   . Pleuritic chest pain 01/16/2015  . Acute renal failure superimposed on stage 3 chronic kidney disease ([employer]) 01/16/2015  . Obesity, Class III, BMI 40-49.9 (morbid obesity) ([place]) 01/16/2015  . Essential hypertension 01/16/2015  . Hypothyroidism 01/16/2015  . OSA on CPAP 01/16/2015  . DM type 2 (diabetes mellitus, type 2) ([place]) 01/16/2015  . Coronary artery disease involving native coronary artery without angina pectoris 10/02/2014  . Physical deconditioning 02/28/2013  . Class 3 severe obesity with serious comorbidity and body mass index (BMI) of 40.0 to 44.9 in adult ([place]) 02/27/2013  . Osteoarthritis 02/27/2013    Gar Ponto MS, PT 10/27/20 10:11 PM  [place] [facility] [address] [place], [place], [address] Phone: [phone]   Fax:  [phone]  Name: Regina [name] MRN: [id number] Date of Birth: 04-23-1941

## 2020-11-02 ENCOUNTER — Encounter: Payer: Medicare Other | Admitting: Physical Therapy

## 2020-11-02 ENCOUNTER — Other Ambulatory Visit: Payer: Self-pay | Admitting: Family Medicine

## 2020-11-03 ENCOUNTER — Telehealth: Payer: Self-pay | Admitting: Family Medicine

## 2020-11-03 MED ORDER — FAMOTIDINE 20 MG PO TABS: 20.0000 mg | ORAL_TABLET | Freq: Every day | ORAL | 2 refills | Status: DC

## 2020-11-03 NOTE — Telephone Encounter (Signed)
Pt is calling in stating that she needs a refill on Rx famotidine (PEPCID) 20 MG  Pharm:  [facility] [address] [place], [place]

## 2020-11-03 NOTE — Telephone Encounter (Signed)
Rx sent in

## 2020-11-05 ENCOUNTER — Telehealth: Payer: Self-pay | Admitting: Pulmonary Disease

## 2020-11-05 NOTE — Telephone Encounter (Signed)
lmtcb for pt.   Pt last seen in 02/2020 and advised to have a CT chest in 18 months.

## 2020-11-06 NOTE — Telephone Encounter (Signed)
Called and spoke with patient. She stated that she was calling to make sure she had not missed a call to schedule her CT scan. I reviewed the last OV note with her that stated the next CT scan needed to be scheduled 18 months from 02/28/20. She verbalized understanding.   I had asked her if she had developed any new symptoms that she was concerned about. She stated that she had been in and out of the hospital last year due to her heart. She was diagnosed with afib. She has noticed an increase in her SOB but cardiology advised her this was coming from her heart, not lungs.   AO, can you please advise?

## 2020-11-06 NOTE — Telephone Encounter (Signed)
Patient is returning phone call. Patient phone number is [phone].

## 2020-11-09 ENCOUNTER — Telehealth: Payer: Self-pay | Admitting: Physical Therapy

## 2020-11-09 ENCOUNTER — Encounter: Payer: Self-pay | Admitting: Physical Therapy

## 2020-11-09 ENCOUNTER — Ambulatory Visit: Payer: Medicare Other | Admitting: Physical Therapy

## 2020-11-09 DIAGNOSIS — M79642 Pain in left hand: Secondary | ICD-10-CM | POA: Diagnosis not present

## 2020-11-09 DIAGNOSIS — S0003XA Contusion of scalp, initial encounter: Secondary | ICD-10-CM | POA: Diagnosis not present

## 2020-11-09 NOTE — Telephone Encounter (Signed)
Called patient regarding no-show appointment. Therapy left message for patient letting her know when he next visit is.

## 2020-11-12 NOTE — Telephone Encounter (Signed)
Let patient know that plan remains the same  Follow-up CT in 18 months from 02/28/2020  Call to let us know if any changes in respiratory status.

## 2020-11-12 NOTE — Progress Notes (Signed)
Cardiology Office Note:   Date:  11/13/2020  NAME:  Regina [name]    MRN: [id number] DOB:  31-Oct-1940   PCP:  [name], [name], MD  Cardiologist:  Evalina Field, MD   Referring MD: [name], [name], MD   Chief Complaint  Patient presents with  . Follow-up         History of Present Illness:   Regina [name] is a 80 y.o. female with a hx of hATTF cardiac amyloidosis, Afib, systolic HF, CKD IV, DM who presents for follow-up.  She reports she is doing well.  She is watching the salt intake.  Controlling her fluid intake.  Weight is down to 206 pounds from 213 pounds at her last visit.  She does report some lightheadedness at times and dizziness.  Symptoms improved throughout the day.  She is not exercising but has no limitations with walking around the house.  Blood pressure 126/64.  She denies any significant shortness of breath.  She reports she can have some pressure in her chest in the morning but this resolves as the day goes on.  I suspect this is acid reflux related.  She overall is doing remarkably well.  EKG shows she is still in A. fib with heart rate in the 80s.  Evaluated by advanced heart failure.  Cardiac MRI confirms her diagnosis that is known of cardiac amyloidosis.  They also recommended aggressive control of her heart rhythm and possibly pursuing another TEE/cardioversion.  She has been unable to have a cardioversion due to left atrial appendage thrombus seen on TEE multiple times.  He takes Lasix 40 mg every other day and sometimes as needed.  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 Persistent Afib -LAA slugde vs thrombus7/20/2021 -persistent sludge 07/01/2020 -LAA thrombus persistent 08/05/2020 -on warfarin 3. DM -A1c 7.4 4. HLD -T chol 117, HDL 56, LDL 49, triglycerides 54 5. HTN 6. Non-obstructive CAD -LHC  01/19/2015 [place] An[place] -50% mid RCA and 50% LAD -MPI 08/14/2020 -> normal 7. OSA 8. CKD 3/4  Past Medical History: Past Medical History:  Diagnosis Date  . (HFpEF) heart failure with preserved ejection fraction ([place])   . Back pain   . 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 Diabetes mellitus without complication ([place])   . Dyspnea   . GERD (gastroesophageal reflux disease)   . H/O echocardiogram    a. 01/2015 Echo: EF 55-60%, Gr 2 DD, mod LVH, mildly dil LA.  Marland Kitchen Heart disease   . Hypertension   . Hypothyroidism   . Joint pain   . Kidney problem   . Lower extremity edema   . Mini stroke ([place])   . Non-obstructive CAD    a. 01/2015 Cardiolite: + inf wall ischemia, EF 56%;  b. 01/2015 Cath: LM nl, LAD 76m LCX min irregs, RCA dominant, 50-676m . Sleep apnea   . Swallowing difficulty   . Thyroid disease     Past Surgical History: Past Surgical History:  Procedure Laterality Date  . ABDOMINAL HYSTERECTOMY    . BUBBLE STUDY  07/01/2020   Procedure: BUBBLE STUDY;  Surgeon: AcElouise MunroeMD;  [facility] Service: Cardiovascular;;  . BUBBLE STUDY  08/05/2020   Procedure: BUBBLE STUDY;  Surgeon: O'Geralynn RileMD;  [facility]  Service: Cardiovascular;;  done with definity   . CARDIAC CATHETERIZATION    . ESOPHAGOGASTRODUODENOSCOPY     a. 01/2015 EGD: patent esophagus.  . ESOPHAGOGASTRODUODENOSCOPY N/A 01/20/2015   Procedure: ESOPHAGOGASTRODUODENOSCOPY (EGD);  Surgeon: Carol Ada, MD;  Location[facility] ENDOSCOPY;  Service: Endoscopy;  Laterality: N/A;  . HAND SURGERY    . JOINT REPLACEMENT     reports history bilateral TKA and right TSA  . LEFT HEART CATHETERIZATION WITH CORONARY ANGIOGRAM N/A 01/19/2015  . TEE WITHOUT CARDIOVERSION  05/04/2020  . TEE WITHOUT CARDIOVERSION N/A 05/05/2020   Procedure: TRANSESOPHAGEAL ECHOCARDIOGRAM (TEE);  Surgeon: Pixie Casino, MD;  Location: [facility] ENDOSCOPY;  Service:  Cardiovascular;  Laterality: N/A;  . TEE WITHOUT CARDIOVERSION N/A 07/01/2020   Procedure: TRANSESOPHAGEAL ECHOCARDIOGRAM (TEE);  Surgeon: Elouise Munroe, MD;  Location: [facility] ENDOSCOPY;  Service: Cardiovascular;  Laterality: N/A;  . TEE WITHOUT CARDIOVERSION N/A 08/05/2020   Procedure: TRANSESOPHAGEAL ECHOCARDIOGRAM (TEE);  Surgeon: Geralynn Rile, MD;  Location: [place];  Service: Cardiovascular;  Laterality: N/A;  . THYROIDECTOMY      Current Medications: Current Meds  Medication Sig  . Alcohol Swabs (B-D SINGLE USE SWABS REGULAR) PADS Use to test blood sugar up to 3 times daily  . allopurinol (ZYLOPRIM) 100 MG tablet Take 1 tablet (100 mg total) by mouth daily.  . Ascorbic Acid (VITAMIN C) 1000 MG tablet Take 1,000 mg by mouth daily.  Marland Kitchen atorvastatin (LIPITOR) 20 MG tablet Take 1 tablet (20 mg total) by mouth daily.  . Blood Glucose Monitoring Suppl (ACCU-CHEK GUIDE) w/Device KIT 1 Device by Does not apply route 3 (three) times daily.  . Cholecalciferol (VITAMIN D3) 50 MCG (2000 UT) capsule Take 2,000 Units by mouth daily.  . clotrimazole-betamethasone (LOTRISONE) cream Apply 1 application topically 2 (two) times daily as needed.  . Continuous Blood Gluc Sensor (FREESTYLE LIBRE 2 SENSOR) MISC 1 Device by Does not apply route as directed.  . diclofenac Sodium (VOLTAREN) 1 % GEL Apply 1 application topically daily as needed (arthritis pain/hands).  . famotidine (PEPCID) 20 MG tablet Take 1 tablet (20 mg total) by mouth at bedtime.  . fluticasone (FLONASE) 50 MCG/ACT nasal spray Place 1 spray into both nostrils daily.  Marland Kitchen gabapentin (NEURONTIN) 600 MG tablet Take 1 tablet by mouth twice daily  . glucose blood (ACCU-CHEK GUIDE) test strip Use to test blood sugar up to 3 times daily  . insulin glargine, 1 Unit Dial, (TOUJEO SOLOSTAR) 300 UNIT/ML Solostar Pen Inject 26 Units into the skin daily. Eat a snack with protein nightly before bedtime.  . insulin lispro (HUMALOG KWIKPEN) 100  UNIT/ML KwikPen Max daily 15 units  . Insulin Pen Needle 32G X 4 MM MISC 1 Device by Does not apply route in the morning, at noon, in the evening, and at bedtime.  . Iron-Vitamins (GERITOL COMPLETE PO) Take 1 tablet by mouth daily.  Marland Kitchen lactulose (CHRONULAC) 10 GM/15ML solution Take 20 g by mouth daily as needed for mild constipation.   . Lancets Misc. (ACCU-CHEK FASTCLIX LANCET) KIT Use to test blood sugar up to 3 times daily  . levothyroxine (SYNTHROID) 150 MCG tablet Take 1 tablet (150 mcg total) by mouth daily before breakfast.  . loratadine (CLARITIN) 10 MG tablet Take 1 tablet (10 mg total) by mouth daily as needed. (Patient taking differently: Take 10 mg by mouth daily as needed for allergies.)  . magnesium oxide (MAG-OX) 400 (241.3 Mg) MG tablet Take 1 tablet (400 mg total) by mouth daily.  Marland Kitchen  meclizine (ANTIVERT) 25 MG tablet Take 25 mg by mouth daily as needed for dizziness.   . metoprolol tartrate (LOPRESSOR) 100 MG tablet Take 0.5 tablets (50 mg total) by mouth 2 (two) times daily.  . nitroGLYCERIN (NITROSTAT) 0.4 MG SL tablet Place 1 tablet (0.4 mg total) under the tongue every 5 (five) minutes as needed for chest pain.  . Omega-3 Fatty Acids (FISH OIL) 1000 MG CAPS Take 2,000 mg by mouth daily.   . polyethylene glycol (MIRALAX / GLYCOLAX) 17 g packet Take 17 g by mouth daily as needed for mild constipation.  Marland Kitchen Propylene Glycol (SYSTANE BALANCE OP) Place 1 drop into both eyes daily as needed (for dry eyes).  . Tafamidis 61 MG CAPS Take 61 mg by mouth daily.  Marland Kitchen torsemide (DEMADEX) 20 MG tablet Take 2 tablets ( 40 mg ) daily  . warfarin (COUMADIN) 5 MG tablet Take 1 tablet (5 mg total) by mouth daily. (Patient taking differently: Take 2.5 mg by mouth See admin instructions. Take 2.5 mg Mon., Wed., Fri. Take 5 mg all the other days at bedtime)     Allergies:    Ace inhibitors, Amlodipine, Atenolol, Avandia [rosiglitazone], Darvon [propoxyphene], Erythromycin, Hydralazine, Hydrocodone,  Levofloxacin, Morphine and related, Percocet [oxycodone-acetaminophen], Spironolactone, and Tramadol   Social History: Social History   Socioeconomic History  . Marital status: Widowed    Spouse name: Not on file  . Number of children: Not on file  . Years of education: Not on file  . Highest education level: Not on file  Occupational History  . Occupation: Retired  Tobacco Use  . Smoking status: Never Smoker  . Smokeless tobacco: Never Used  Vaping Use  . Vaping Use: Never used  Substance and Sexual Activity  . Alcohol use: No  . Drug use: Not on file  . Sexual activity: Not Currently  Other Topics Concern  . Not on file  Social History Narrative   Lives with daughter   Social Determinants of Health   Financial Resource Strain: Not on file  Food Insecurity: Not on file  Transportation Needs: Not on file  Physical Activity: Not on file  Stress: Not on file  Social Connections: Not on file     Family History: The patient's family history includes Alcoholism in her father; Cancer in her father; Heart disease in her mother; Hypertension in her mother.  ROS:   All other ROS reviewed and negative. Pertinent positives noted in the HPI.     EKGs/Labs/Other Studies Reviewed:   The following studies were personally reviewed by me today:  EKG:  EKG is ordered today.  The ekg ordered today demonstrates atrial fibrillation heart rate 88, nonspecific ST-T changes, and was personally reviewed by me.   CMR 10/24/2020  IMPRESSION: 1. Normal LV size with moderate LV hypertrophy. The LV is hypokinetic towards the base and more normokinetic towards the apex. EF 30%.  2.  Normal RV size with EF 30%.  3. LGE pattern as well as high extracellular volume percentage (67%) are highly suggestive of cardiac amyloidosis.  Recent Labs: 04/25/2020: ALT 18; Magnesium 1.9 05/28/2020: BNP 630.4 06/08/2020: TSH 3.24 08/03/2020: BUN 52; Creatinine, Ser 1.87; Hemoglobin 12.1; Platelets 362;  Potassium 4.3; Sodium 140   Recent Lipid Panel    Component Value Date/Time   CHOL 117 03/24/2020 1428   TRIG 54 03/24/2020 1428   HDL 56 03/24/2020 1428   CHOLHDL 2 05/22/2019 1631   VLDL 23.8 05/22/2019 1631   LDLCALC 49 03/24/2020 1428  Physical Exam:   VS:  BP 126/64   Pulse 88   Ht 5' 2"  (1.575 m)   Wt 206 lb (93.4 kg)   SpO2 100%   BMI 37.68 kg/m    Wt Readings from Last 3 Encounters:  11/13/20 206 lb (93.4 kg)  09/30/20 213 lb 12.8 oz (97 kg)  09/16/20 215 lb (97.5 kg)    General: Well nourished, well developed, in no acute distress Head: Atraumatic, normal size  Eyes: PEERLA, EOMI  Neck: Supple, no JVD Endocrine: No thryomegaly Cardiac: Normal S1, S2; RRR; no murmurs, rubs, or gallops Lungs: Clear to auscultation bilaterally, no wheezing, rhonchi or rales  Abd: Soft, nontender, no hepatomegaly  Ext: No edema, pulses 2+ Musculoskeletal: No deformities, BUE and BLE strength normal and equal Skin: Warm and dry, no rashes   Neuro: Alert and oriented to person, place, time, and situation, CNII-XII grossly intact, no focal deficits  Psych: Normal mood and affect   ASSESSMENT:   Regina [name] is a 80 y.o. female who presents for the following: 1. Cardiac amyloidosis ([place])   2. Chronic systolic heart failure (HCC)   3. Persistent atrial fibrillation ([place])   4. Long term (current) use of anticoagulants   5. Thrombus of left atrial appendage   6. Coronary artery disease involving native coronary artery of native heart without angina pectoris     PLAN:   1. Cardiac amyloidosis ([place]) 2. Chronic systolic heart failure (HCC) -Hereditary cardiac amyloidosis.  Doing well from a volume standpoint.  Weights are stable.  On torsemide 40 mg daily as needed.  Minimal symptoms reported today.  No shortness of breath. -A. fib has been an issue.  Remains in rate controlled afib on metoprolol.  No hypotensive episodes.  Blood pressure 126/64.  EF 30% on echo.  Cannot be on  ACE/ARB/Arni/MRA due to significant CKD. -EF drop likely related to A. fib.  Course complicated by left atrial appendage thrombus and cannot undergo TEE/cardioversion. -She is on tafamidis.  No issues with cost. -We did discuss attempt at re TEE/cardioversion.  She will think about this.  She is had 3 TEEs that have shown left atrial appendage sludge and thrombus.  She is reluctant for further procedures.  3. Persistent atrial fibrillation ([place]) 4. Long term (current) use of anticoagulants 5. Thrombus of left atrial appendage -Cardiac amyloid complicated by A. fib.  A. fib is likely contributed to her EF drop.  3 TEE this past year with left atrial sludge and thrombus.  Cannot undergo cardioversion.  We discussed a repeat attempt.  She will think about this.  She will continue Coumadin for now.  INRs are therapeutic. -Rate controlled metoprolol.  We should be cautious with any aggressive BP agents given her cardiac amyloidosis.  She was hypotensive on last visits. -Gauge not ideal given kidney function. -Amnio would be a rate control option if blood pressure worsens but this would likely convert her back to rhythm and predispose her stroke.  She is really stable I do not want to create a bigger issue here.  6. Coronary artery disease involving native coronary artery of native heart without angina pectoris -Nonobstructive disease on cath in 2016 in [place].  Recent nuclear medicine stress test normal.  Continue Lipitor.  No aspirin on Coumadin.  Disposition: Return in about 3 months (around 02/11/2021).  Medication Adjustments/Labs and Tests Ordered: Current medicines are reviewed at length with the patient today.  Concerns regarding medicines are outlined above.  No  orders of the defined types were placed in this encounter.  No orders of the defined types were placed in this encounter.   Patient Instructions  Medication Instructions:  The current medical regimen is effective;   continue present plan and medications.  *If you need a refill on your cardiac medications before your next appointment, please call your pharmacy*  Follow-Up: At [facility], you and your health needs are our priority.  As part of our continuing mission to provide you with exceptional heart care, we have created designated Provider Care Teams.  These Care Teams include your primary Cardiologist (physician) and Advanced Practice Providers (APPs -  Physician Assistants and Nurse Practitioners) who all work together to provide you with the care you need, when you need it.  We recommend signing up for the patient portal called "MyChart".  Sign up information is provided on this After Visit Summary.  MyChart is used to connect with patients for Virtual Visits (Telemedicine).  Patients are able to view lab/test results, encounter notes, upcoming appointments, etc.  Non-urgent messages can be sent to your provider as well.   To learn more about what you can do with MyChart, go to [email or web address].    Your next appointment:   3 month(s)  The format for your next appointment:   In Person  Provider:   [name], MD        Time Spent with Patient: I have spent a total of 25 minutes with patient reviewing hospital notes, telemetry, EKGs, labs and examining the patient as well as establishing an assessment and plan that was discussed with the patient.  > 50% of time was spent in direct patient care.  Signed, Addison Naegeli. Audie Box, [place]  [address] [place], [place] [address] [phone]  11/13/2020 11:14 AM

## 2020-11-12 NOTE — Telephone Encounter (Signed)
Spoke with pt and revived Dr. Judson Roch recommendations. Pt stated understanding. Nothing further needed at this time.

## 2020-11-13 ENCOUNTER — Other Ambulatory Visit: Payer: Self-pay

## 2020-11-13 ENCOUNTER — Ambulatory Visit: Payer: Medicare Other | Admitting: Cardiovascular Disease

## 2020-11-13 ENCOUNTER — Encounter: Payer: Self-pay | Admitting: Physical Therapy

## 2020-11-13 ENCOUNTER — Encounter: Payer: Self-pay | Admitting: Cardiovascular Disease

## 2020-11-13 ENCOUNTER — Ambulatory Visit (INDEPENDENT_AMBULATORY_CARE_PROVIDER_SITE_OTHER): Payer: Medicare Other

## 2020-11-13 ENCOUNTER — Ambulatory Visit: Payer: Medicare Other | Admitting: Physical Therapy

## 2020-11-13 VITALS — BP 126/64 | HR 88 | Ht 62.0 in | Wt 206.0 lb

## 2020-11-13 DIAGNOSIS — I2510 Atherosclerotic heart disease of native coronary artery without angina pectoris: Secondary | ICD-10-CM

## 2020-11-13 DIAGNOSIS — G8929 Other chronic pain: Secondary | ICD-10-CM | POA: Diagnosis not present

## 2020-11-13 DIAGNOSIS — M545 Low back pain, unspecified: Secondary | ICD-10-CM | POA: Diagnosis not present

## 2020-11-13 DIAGNOSIS — I513 Intracardiac thrombosis, not elsewhere classified: Secondary | ICD-10-CM

## 2020-11-13 DIAGNOSIS — I5022 Chronic systolic (congestive) heart failure: Secondary | ICD-10-CM

## 2020-11-13 DIAGNOSIS — I4819 Other persistent atrial fibrillation: Secondary | ICD-10-CM | POA: Diagnosis not present

## 2020-11-13 DIAGNOSIS — M25512 Pain in left shoulder: Secondary | ICD-10-CM

## 2020-11-13 DIAGNOSIS — Z7901 Long term (current) use of anticoagulants: Secondary | ICD-10-CM

## 2020-11-13 DIAGNOSIS — M6281 Muscle weakness (generalized): Secondary | ICD-10-CM

## 2020-11-13 DIAGNOSIS — I43 Cardiomyopathy in diseases classified elsewhere: Secondary | ICD-10-CM

## 2020-11-13 DIAGNOSIS — E854 Organ-limited amyloidosis: Secondary | ICD-10-CM

## 2020-11-13 DIAGNOSIS — M5412 Radiculopathy, cervical region: Secondary | ICD-10-CM | POA: Diagnosis not present

## 2020-11-13 LAB — POCT INR: INR: 2.9 (ref 2.0–3.0)

## 2020-11-13 NOTE — Patient Instructions (Signed)
Continue taking 1/2 tablet daily except 1 tablet each Sunday and  Repeat INR in 6 weeks

## 2020-11-13 NOTE — Therapy (Addendum)
[place], [place], [address] Phone: [phone]   Fax:  [phone]  Physical Therapy Treatment/Discharge   Patient Details  Name: Regina [name] MRN: [id number] Date of Birth: 23-Apr-1941 Referring Provider (PT): Dr Inez Catalina [name]   Encounter Date: 11/13/2020   PT End of Session - 11/13/20 1235    Visit Number 3    Number of Visits 12    Date for PT Re-Evaluation 11/18/20    Authorization Type uhc mcr    PT Start Time 1230    PT Stop Time 1305    PT Time Calculation (min) 35 min    Activity Tolerance Patient tolerated treatment well    Behavior During Therapy [facility] for tasks assessed/performed           Past Medical History:  Diagnosis Date  . (HFpEF) heart failure with preserved ejection fraction ([place])   . Back pain   . 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 Diabetes mellitus without complication ([place])   . Dyspnea   . GERD (gastroesophageal reflux disease)   . H/O echocardiogram    a. 01/2015 Echo: EF 55-60%, Gr 2 DD, mod LVH, mildly dil LA.  Marland Kitchen Heart disease   . Hypertension   . Hypothyroidism   . Joint pain   . Kidney problem   . Lower extremity edema   . Mini stroke ([place])   . Non-obstructive CAD    a. 01/2015 Cardiolite: + inf wall ischemia, EF 56%;  b. 01/2015 Cath: LM nl, LAD 73m LCX min irregs, RCA dominant, 50-660m . Sleep apnea   . Swallowing difficulty   . Thyroid disease     Past Surgical History:  Procedure Laterality Date  . ABDOMINAL HYSTERECTOMY    . BUBBLE STUDY  07/01/2020   Procedure: BUBBLE STUDY;  Surgeon: AcElouise MunroeMD;  [facility] Service: Cardiovascular;;  . BUBBLE STUDY  08/05/2020   Procedure: BUBBLE STUDY;  Surgeon: O'Geralynn RileMD;  [facility] Service: Cardiovascular;;  done with definity   . CARDIAC CATHETERIZATION    . ESOPHAGOGASTRODUODENOSCOPY     a. 01/2015  EGD: patent esophagus.  . ESOPHAGOGASTRODUODENOSCOPY N/A 01/20/2015   Procedure: ESOPHAGOGASTRODUODENOSCOPY (EGD);  Surgeon: PaCarol AdaMD;  [facility];  Service: Endoscopy;  Laterality: N/A;  . HAND SURGERY    . JOINT REPLACEMENT     reports history bilateral TKA and right TSA  . LEFT HEART CATHETERIZATION WITH CORONARY ANGIOGRAM N/A 01/19/2015  . TEE WITHOUT CARDIOVERSION  05/04/2020  . TEE WITHOUT CARDIOVERSION N/A 05/05/2020   Procedure: TRANSESOPHAGEAL ECHOCARDIOGRAM (TEE);  Surgeon: HiPixie CasinoMD;  [facility];  Service: Cardiovascular;  Laterality: N/A;  . TEE WITHOUT CARDIOVERSION N/A 07/01/2020   Procedure: TRANSESOPHAGEAL ECHOCARDIOGRAM (TEE);  Surgeon: AcElouise MunroeMD;  [facility];  Service: Cardiovascular;  Laterality: N/A;  . TEE WITHOUT CARDIOVERSION N/A 08/05/2020   Procedure: TRANSESOPHAGEAL ECHOCARDIOGRAM (TEE);  Surgeon: O'Geralynn RileMD;  [facility]: [facility] Service: Cardiovascular;  Laterality: N/A;  . THYROIDECTOMY      There were no vitals filed for this visit.   Subjective Assessment - 11/13/20 1233    Subjective Pt was late today.  [place].  I'm scare to do my exercises.  I have a knot in my neck from when I fell.    Currently in Pain? Yes    Pain  Score 5     Pain Location Neck    Pain Orientation Left    Pain Descriptors / Indicators Aching    Pain Type Chronic pain              [facility] PT Assessment - 11/13/20 0001      AROM   Cervical Flexion 35    Cervical Extension 45    Cervical - Right Side Bend 25    Cervical - Left Side Bend 25    Cervical - Right Rotation 60    Cervical - Left Rotation 60             [facility] Adult PT Treatment/Exercise - 11/13/20 0001      Self-Care   Self-Care Other Self-Care Comments;Posture;Heat/Ice Application    Posture seated, chin tuck    Heat/Ice Application heat for mm soreness    Other Self-Care Comments  HEP , DC, FOTO      Neck  Exercises: Standing   Other Standing Exercises shoulder extension and row x 15 red band cues for reducing neck stress      Manual Therapy   Manual Therapy Soft tissue mobilization    Manual therapy comments PROM in rotation and sidebending    Soft tissue mobilization lateral L cervicals, scalenes and upper trap    Manual Traction suboccipital release      Neck Exercises: Stretches   Upper Trapezius Stretch 3 reps;20 seconds    Corner Stretch 2 reps;30 seconds                    PT Short Term Goals - 11/13/20 1306      PT SHORT TERM GOAL #1   Title Patient will increase cervical flexion and extension by 10 degrees    Status Achieved      PT SHORT TERM GOAL #2   Title Patient will increase bilateral cervical rotation by 10 degrees    Status Achieved      PT SHORT TERM GOAL #3   Title Patient will report no pain radiating down into her arm    Status Achieved             PT Long Term Goals - 11/13/20 1308      PT LONG TERM GOAL #1   Title Patient will demonstrate 150 degrees of shoulder flexion    Status Unable to assess      PT LONG TERM GOAL #2   Title Patient will use her left arm for ADL's without increased pain    Status Achieved      PT LONG TERM GOAL #3   Title Tolerate sitting at least 30-40 minutes for car travel, eating meals with pain <3/10 at worst    Status Partially Met      PT LONG TERM GOAL #4   Title Perform transfers, bed mobility at home with LBP <3/10 at worst    Status Not Met                 Plan - 11/13/20 1237    Clinical Impression Statement Patient improved.  [place] and is sore but before that was was feeling quite good.  We reviewed her exercises and discussed posture. She would like to get massage therapy and feels ready for DC>  FOTO score improved to 76%, better than expected.    PT Treatment/Interventions ADLs/Self Care Home Management;Cryotherapy;Iontophoresis 19m/ml Dexamethasone;Ultrasound;DME Instruction;Functional  mobility training;Therapeutic activities;Therapeutic exercise;Neuromuscular re-education;Patient/family education;Manual techniques;Dry needling;Taping;Passive range of motion  PT Next Visit Plan DC, NA    PT Home Exercise Plan Access Code: [email or web address]           Patient will benefit from skilled therapeutic intervention in order to improve the following deficits and impairments:     Visit Diagnosis: Radiculopathy, cervical region  Chronic left shoulder pain  Muscle weakness (generalized)  Bilateral low back pain without sciatica, unspecified chronicity     Problem List Patient Active Problem List   Diagnosis Date Noted  . Type 2 diabetes mellitus with stage 3a chronic kidney disease, with long-term current use of insulin ([place]) 09/14/2020  . Type 2 diabetes mellitus with retinopathy, with long-term current use of insulin ([place]) 09/14/2020  . Diabetes mellitus ([place]) 09/14/2020  . Type 2 diabetes mellitus with diabetic polyneuropathy, with long-term current use of insulin ([place]) 09/14/2020  . Long term (current) use of anticoagulants 07/13/2020  . Persistent atrial fibrillation ([place])   . Mixed hyperlipidemia   . Chronic diastolic heart failure ([place])   . Demand ischemia ([place])   . Atrial fibrillation ([place]) 04/24/2020  . Other fatigue 03/24/2020  . Short of breath on exertion 03/24/2020  . Congestive heart failure ([place]) 03/24/2020  . Vitamin D deficiency 03/24/2020  . Sleep apnea 05/21/2019  . History of hepatitis C 05/21/2019  . Insomnia 05/21/2019  . CKD (chronic kidney disease), stage III ([place]) 05/21/2019  . GERD (gastroesophageal reflux disease) 04/20/2018  . Type 2 diabetes mellitus with diabetic neuropathy, unspecified ([place]) 12/19/2016  . History of TIA (transient ischemic attack) 12/19/2016  . Hyperlipidemia associated with type 2 diabetes mellitus ([place]) 12/19/2016  . S/P total knee replacement using cement, right 03/18/2015  . Non-obstructive CAD   . Cardiovascular  stress test abnormal   . Pleuritic chest pain 01/16/2015  . Acute renal failure superimposed on stage 3 chronic kidney disease ([place]) 01/16/2015  . Obesity, Class III, BMI 40-49.9 (morbid obesity) ([place]) 01/16/2015  . Essential hypertension 01/16/2015  . Hypothyroidism 01/16/2015  . OSA on CPAP 01/16/2015  . DM type 2 (diabetes mellitus, type 2) ([place]) 01/16/2015  . Coronary artery disease involving native coronary artery without angina pectoris 10/02/2014  . Physical deconditioning 02/28/2013  . Class 3 severe obesity with serious comorbidity and body mass index (BMI) of 40.0 to 44.9 in adult ([place]) 02/27/2013  . Osteoarthritis 02/27/2013    Regina [name] 11/13/2020, 1:13 PM  [facility] [address] [place], [place], [address] Phone: [phone]   Fax:  [phone]  Name: Regina [name] MRN: [id number] Date of Birth: 1941/03/06  PHYSICAL THERAPY DISCHARGE SUMMARY  Visits from Start of Care: 3  Current functional level related to goals / functional outcomes: See above    Remaining deficits: See above    Education / Equipment: HEP, falls, RICE, heat , posture Plan: Patient agrees to discharge.  Patient goals were partially met. Patient is being discharged due to the patient's request.  ?????     Raeford Razor, PT 11/16/20 10:36 AM Phone: [phone] Fax: [place], PT 11/13/20 1:13 PM Phone: [phone] Fax: [phone]  .

## 2020-11-13 NOTE — Addendum Note (Signed)
Addended by: Wonda Horner on: 11/13/2020 01:08 PM   Modules accepted: Orders

## 2020-11-13 NOTE — Patient Instructions (Signed)
Medication Instructions:  The current medical regimen is effective;  continue present plan and medications.  *If you need a refill on your cardiac medications before your next appointment, please call your pharmacy*   Follow-Up: At [facility], you and your health needs are our priority.  As part of our continuing mission to provide you with exceptional heart care, we have created designated Provider Care Teams.  These Care Teams include your primary Cardiologist (physician) and Advanced Practice Providers (APPs -  Physician Assistants and Nurse Practitioners) who all work together to provide you with the care you need, when you need it.  We recommend signing up for the patient portal called "MyChart".  Sign up information is provided on this After Visit Summary.  MyChart is used to connect with patients for Virtual Visits (Telemedicine).  Patients are able to view lab/test results, encounter notes, upcoming appointments, etc.  Non-urgent messages can be sent to your provider as well.   To learn more about what you can do with MyChart, go to [email or web address]://www.mychart.com.    Your next appointment:   3 month(s)  The format for your next appointment:   In Person  Provider:   [place] O'Neal, MD     

## 2020-11-17 DIAGNOSIS — G4733 Obstructive sleep apnea (adult) (pediatric): Secondary | ICD-10-CM | POA: Diagnosis not present

## 2020-11-18 NOTE — Addendum Note (Signed)
Addended by: Wonda Horner on: 11/18/2020 04:07 PM   Modules accepted: Orders

## 2020-11-20 ENCOUNTER — Telehealth: Payer: Self-pay | Admitting: Cardiovascular Disease

## 2020-11-20 NOTE — Telephone Encounter (Signed)
Pt c/o BP issue: STAT if pt c/o blurred vision, one-sided weakness or slurred speech  1. What are your last 5 BP readings?  11/17/20: 120/70 11/19/20: 129/75 11/20/20: 128/69  2. Are you having any other symptoms (ex. Dizziness, headache, blurred vision, passed out)? no  3. What is your BP issue? Patient wanted to know if she should still take her lopressor this morning. Her BP seems low

## 2020-11-20 NOTE — Telephone Encounter (Signed)
Spoke with pt, aware blood pressure is fine and she should take her lopressor as scheduled.

## 2020-12-01 DIAGNOSIS — I10 Essential (primary) hypertension: Secondary | ICD-10-CM | POA: Diagnosis not present

## 2020-12-01 DIAGNOSIS — Z79899 Other long term (current) drug therapy: Secondary | ICD-10-CM | POA: Diagnosis not present

## 2020-12-01 DIAGNOSIS — I6522 Occlusion and stenosis of left carotid artery: Secondary | ICD-10-CM | POA: Diagnosis not present

## 2020-12-01 DIAGNOSIS — I639 Cerebral infarction, unspecified: Secondary | ICD-10-CM | POA: Diagnosis not present

## 2020-12-01 DIAGNOSIS — I5042 Chronic combined systolic (congestive) and diastolic (congestive) heart failure: Secondary | ICD-10-CM | POA: Diagnosis not present

## 2020-12-01 DIAGNOSIS — G629 Polyneuropathy, unspecified: Secondary | ICD-10-CM | POA: Diagnosis not present

## 2020-12-01 DIAGNOSIS — Z8669 Personal history of other diseases of the nervous system and sense organs: Secondary | ICD-10-CM | POA: Diagnosis not present

## 2020-12-01 DIAGNOSIS — M1990 Unspecified osteoarthritis, unspecified site: Secondary | ICD-10-CM | POA: Diagnosis not present

## 2020-12-01 DIAGNOSIS — I6350 Cerebral infarction due to unspecified occlusion or stenosis of unspecified cerebral artery: Secondary | ICD-10-CM | POA: Diagnosis not present

## 2020-12-01 DIAGNOSIS — R29898 Other symptoms and signs involving the musculoskeletal system: Secondary | ICD-10-CM | POA: Diagnosis not present

## 2020-12-01 DIAGNOSIS — G4733 Obstructive sleep apnea (adult) (pediatric): Secondary | ICD-10-CM | POA: Diagnosis not present

## 2020-12-01 DIAGNOSIS — Z7901 Long term (current) use of anticoagulants: Secondary | ICD-10-CM | POA: Diagnosis not present

## 2020-12-01 DIAGNOSIS — R531 Weakness: Secondary | ICD-10-CM | POA: Diagnosis not present

## 2020-12-01 DIAGNOSIS — E854 Organ-limited amyloidosis: Secondary | ICD-10-CM | POA: Diagnosis not present

## 2020-12-01 DIAGNOSIS — N183 Chronic kidney disease, stage 3 unspecified: Secondary | ICD-10-CM | POA: Diagnosis not present

## 2020-12-01 DIAGNOSIS — I4891 Unspecified atrial fibrillation: Secondary | ICD-10-CM | POA: Diagnosis not present

## 2020-12-01 DIAGNOSIS — Z20822 Contact with and (suspected) exposure to covid-19: Secondary | ICD-10-CM | POA: Diagnosis not present

## 2020-12-01 DIAGNOSIS — Z8673 Personal history of transient ischemic attack (TIA), and cerebral infarction without residual deficits: Secondary | ICD-10-CM | POA: Diagnosis not present

## 2020-12-01 DIAGNOSIS — E039 Hypothyroidism, unspecified: Secondary | ICD-10-CM | POA: Diagnosis not present

## 2020-12-01 DIAGNOSIS — I43 Cardiomyopathy in diseases classified elsewhere: Secondary | ICD-10-CM | POA: Diagnosis not present

## 2020-12-01 DIAGNOSIS — D649 Anemia, unspecified: Secondary | ICD-10-CM | POA: Diagnosis not present

## 2020-12-01 DIAGNOSIS — I130 Hypertensive heart and chronic kidney disease with heart failure and stage 1 through stage 4 chronic kidney disease, or unspecified chronic kidney disease: Secondary | ICD-10-CM | POA: Diagnosis not present

## 2020-12-01 DIAGNOSIS — E7849 Other hyperlipidemia: Secondary | ICD-10-CM | POA: Diagnosis not present

## 2020-12-01 DIAGNOSIS — M6289 Other specified disorders of muscle: Secondary | ICD-10-CM | POA: Diagnosis not present

## 2020-12-01 DIAGNOSIS — E1122 Type 2 diabetes mellitus with diabetic chronic kidney disease: Secondary | ICD-10-CM | POA: Diagnosis not present

## 2020-12-01 DIAGNOSIS — R202 Paresthesia of skin: Secondary | ICD-10-CM | POA: Diagnosis not present

## 2020-12-01 DIAGNOSIS — G4700 Insomnia, unspecified: Secondary | ICD-10-CM | POA: Diagnosis not present

## 2020-12-01 DIAGNOSIS — I2510 Atherosclerotic heart disease of native coronary artery without angina pectoris: Secondary | ICD-10-CM | POA: Diagnosis not present

## 2020-12-02 ENCOUNTER — Other Ambulatory Visit: Payer: Self-pay

## 2020-12-02 ENCOUNTER — Telehealth: Payer: Self-pay | Admitting: Cardiovascular Disease

## 2020-12-02 DIAGNOSIS — I639 Cerebral infarction, unspecified: Secondary | ICD-10-CM | POA: Diagnosis not present

## 2020-12-02 DIAGNOSIS — R531 Weakness: Secondary | ICD-10-CM | POA: Diagnosis not present

## 2020-12-02 DIAGNOSIS — M6289 Other specified disorders of muscle: Secondary | ICD-10-CM | POA: Diagnosis not present

## 2020-12-02 DIAGNOSIS — R202 Paresthesia of skin: Secondary | ICD-10-CM | POA: Diagnosis not present

## 2020-12-02 DIAGNOSIS — I6522 Occlusion and stenosis of left carotid artery: Secondary | ICD-10-CM | POA: Diagnosis not present

## 2020-12-02 NOTE — Telephone Encounter (Signed)
Regina [name] can we please order home health PT for them?  I really find this hard to believe that [facility] cannot order home health in [facility].  Let us try to help them out.  [name], MD, [place]  [address] [place], [place] [address] [phone]  3:16 PM

## 2020-12-02 NOTE — Telephone Encounter (Signed)
Spoke to patient's daughter Rodena Piety.She stated mother recently had a stroke and is still hospital at [facility].Stated she will need home PT and wanted Dr.O'Neal to order since they do not order PT in [place].Advised I will send message to him for advice.

## 2020-12-02 NOTE — Telephone Encounter (Signed)
Pt daughter called in and stated that pt had a very middle stroke and is currently in the hosp at [facility].  They will not release her until PT home health is secured for her when she gets out.  They will not order pt in this area.  Daughter would like to know if Dr Audie Box could order PT home [place] number  [phone]- daughter  Mom - [phone]- Please call mom 1st

## 2020-12-02 NOTE — Telephone Encounter (Signed)
Called patient, [facility] did place the referral to home health PT at the hospital, our social worker here has contacted them to make sure it has went through correctly and will let me know if not. I did advise patient of this, and to contact me back if she does not hear anything from them- she verbalized understanding.

## 2020-12-09 ENCOUNTER — Other Ambulatory Visit: Payer: Self-pay | Admitting: Family Medicine

## 2020-12-14 NOTE — Telephone Encounter (Signed)
The patient called to let Dr. [name] know that she was released from the hospital and the [facility] is not able to come to her house for PT/OT because she lives with her son and he works from home and she needs them to come out from 25 - 5:30.   Her son lives on the third floor and she has a heart condition.  She hasn't had OT/PT since she's been home.

## 2020-12-14 NOTE — Telephone Encounter (Signed)
I spoke with pt. Pt's son needs a home health agency that can come after 5 or on the weekends. I advised pt we would place a new referral to see if we can find one that can do this. Pt verbalized understanding.

## 2020-12-17 DIAGNOSIS — E1122 Type 2 diabetes mellitus with diabetic chronic kidney disease: Secondary | ICD-10-CM | POA: Diagnosis not present

## 2020-12-17 DIAGNOSIS — I43 Cardiomyopathy in diseases classified elsewhere: Secondary | ICD-10-CM | POA: Diagnosis not present

## 2020-12-17 DIAGNOSIS — I639 Cerebral infarction, unspecified: Secondary | ICD-10-CM | POA: Diagnosis not present

## 2020-12-17 DIAGNOSIS — I480 Paroxysmal atrial fibrillation: Secondary | ICD-10-CM | POA: Diagnosis not present

## 2020-12-17 DIAGNOSIS — I509 Heart failure, unspecified: Secondary | ICD-10-CM | POA: Diagnosis not present

## 2020-12-17 DIAGNOSIS — I2510 Atherosclerotic heart disease of native coronary artery without angina pectoris: Secondary | ICD-10-CM | POA: Diagnosis not present

## 2020-12-17 DIAGNOSIS — I129 Hypertensive chronic kidney disease with stage 1 through stage 4 chronic kidney disease, or unspecified chronic kidney disease: Secondary | ICD-10-CM | POA: Diagnosis not present

## 2020-12-17 DIAGNOSIS — E854 Organ-limited amyloidosis: Secondary | ICD-10-CM | POA: Diagnosis not present

## 2020-12-17 DIAGNOSIS — E785 Hyperlipidemia, unspecified: Secondary | ICD-10-CM | POA: Diagnosis not present

## 2020-12-17 DIAGNOSIS — N1832 Chronic kidney disease, stage 3b: Secondary | ICD-10-CM | POA: Diagnosis not present

## 2020-12-24 ENCOUNTER — Telehealth: Payer: Self-pay | Admitting: Cardiovascular Disease

## 2020-12-24 NOTE — Telephone Encounter (Signed)
Patient wanted Dr. Kathalene Frames advice as to whether she should keep her appointment with the Stroke doctor at [facility] 01/07/21. Please advise

## 2020-12-24 NOTE — Telephone Encounter (Signed)
Left message to call back  

## 2020-12-24 NOTE — Telephone Encounter (Signed)
Spoke with patient and advised needs follow up with Neurology either in [place] or at [facility] Patient has follow up at [facility] later this month Explained to patient may be a while before she could be seen in [place] so initial f/u may be better at [facility] Patient will try to arrange follow up in [place] after that

## 2020-12-25 ENCOUNTER — Other Ambulatory Visit: Payer: Self-pay

## 2020-12-25 ENCOUNTER — Ambulatory Visit (INDEPENDENT_AMBULATORY_CARE_PROVIDER_SITE_OTHER): Payer: Medicare Other

## 2020-12-25 DIAGNOSIS — Z7901 Long term (current) use of anticoagulants: Secondary | ICD-10-CM

## 2020-12-25 DIAGNOSIS — I4819 Other persistent atrial fibrillation: Secondary | ICD-10-CM

## 2020-12-25 LAB — POCT INR: INR: 2.3 (ref 2.0–3.0)

## 2020-12-25 NOTE — Patient Instructions (Signed)
Continue taking 1/2 tablet daily except 1 tablet each Sunday and  Repeat INR in 6 weeks

## 2020-12-28 ENCOUNTER — Telehealth: Payer: Self-pay | Admitting: Pulmonary Disease

## 2020-12-28 NOTE — Telephone Encounter (Signed)
Call returned to patient, confirmed DOB. She reports she has been on cpap for years. She reports a dry cough that has been a on-going problem.  She states she just recently got new tubing so she does not think the cough is due to old equipment. She reports last night her nose was burning so bad she had to take it off. She reports a stuffy nose but she is more concerned about the coughing and the nose burning. Patient denies any other symptoms. She is requesting recommendations as far as the nose burning and cough. Denies fever. Denies chest pain or SOB. No acute distress.   AO please advise. Thanks

## 2020-12-29 NOTE — Telephone Encounter (Signed)
Not sure its related to CPAP.  Pressure from my CPAP may be making you feel worse.  Nasal steroid-Flonase or Nasonex 2 sprays daily may help symptoms-over-the-counter  Can try and stay of CPAP until symptoms improve  Clean hose and mask with warm soapy water and hang to dry

## 2020-12-29 NOTE — Telephone Encounter (Signed)
Attempted to call pt but unable to reach. Left message for her to return call. 

## 2020-12-29 NOTE — Telephone Encounter (Signed)
Patient is returning phone call. Patient phone number is [phone].

## 2020-12-29 NOTE — Telephone Encounter (Signed)
Called and spoke with pt letting her know the info stated by AO and she verbalized understanding. Nothing further needed.

## 2021-01-07 DIAGNOSIS — Z8673 Personal history of transient ischemic attack (TIA), and cerebral infarction without residual deficits: Secondary | ICD-10-CM | POA: Diagnosis not present

## 2021-01-07 DIAGNOSIS — I513 Intracardiac thrombosis, not elsewhere classified: Secondary | ICD-10-CM | POA: Diagnosis not present

## 2021-01-07 DIAGNOSIS — I4891 Unspecified atrial fibrillation: Secondary | ICD-10-CM | POA: Diagnosis not present

## 2021-01-08 ENCOUNTER — Telehealth: Payer: Self-pay | Admitting: Internal Medicine

## 2021-01-08 ENCOUNTER — Other Ambulatory Visit: Payer: Self-pay

## 2021-01-08 ENCOUNTER — Ambulatory Visit: Payer: Medicare Other | Admitting: Internal Medicine

## 2021-01-08 ENCOUNTER — Encounter: Payer: Self-pay | Admitting: Internal Medicine

## 2021-01-08 VITALS — BP 134/72 | HR 78 | Resp 20 | Ht 62.0 in | Wt 217.6 lb

## 2021-01-08 DIAGNOSIS — Z794 Long term (current) use of insulin: Secondary | ICD-10-CM

## 2021-01-08 DIAGNOSIS — E1140 Type 2 diabetes mellitus with diabetic neuropathy, unspecified: Secondary | ICD-10-CM

## 2021-01-08 DIAGNOSIS — E11319 Type 2 diabetes mellitus with unspecified diabetic retinopathy without macular edema: Secondary | ICD-10-CM

## 2021-01-08 DIAGNOSIS — E1122 Type 2 diabetes mellitus with diabetic chronic kidney disease: Secondary | ICD-10-CM

## 2021-01-08 DIAGNOSIS — N1831 Chronic kidney disease, stage 3a: Secondary | ICD-10-CM

## 2021-01-08 DIAGNOSIS — E1159 Type 2 diabetes mellitus with other circulatory complications: Secondary | ICD-10-CM | POA: Diagnosis not present

## 2021-01-08 LAB — POCT GLYCOSYLATED HEMOGLOBIN (HGB A1C): Hemoglobin A1C: 7.4 % — AB (ref 4.0–5.6)

## 2021-01-08 LAB — GLUCOSE, POCT (MANUAL RESULT ENTRY): POC Glucose: 179 mg/dl — AB (ref 70–99)

## 2021-01-08 MED ORDER — DEXCOM G6 TRANSMITTER MISC: 1.0000 | 3 refills | Status: DC

## 2021-01-08 MED ORDER — DEXCOM G6 SENSOR MISC: 1.0000 | 3 refills | Status: DC

## 2021-01-08 NOTE — Progress Notes (Signed)
Name: Regina [name]  Age/ Sex: 80 y.o., female   MRN/ DOB: [id number], 1941-01-21     PCP: [name], [name], MD   Reason for Endocrinology Evaluation: Type 2 Diabetes Mellitus  Initial Endocrine Consultative Visit: 09/09/2020    PATIENT IDENTIFIER: [name] is a 80 y.o. female with a past medical history of T2DM, HTN, CAD, OSA on CPAP  and A.Fib. The patient has followed with Endocrinology clinic since 09/09/2020 for consultative assistance with management of her diabetes.  DIABETIC HISTORY:  Regina [name] was diagnosed with DM yrs ago. Her hemoglobin A1c has ranged from 6.9% in 2021, peaking at 7.8% in 2020   On her initial visit to our clinic she had an A1c of 7.4% She was on basal insulin and Ozemopic, she was already out of Ozempic and we held off until more data was available about her retinopathy. We started humalog per correction scale     Nephrology [facility] - Dr. peoples  SUBJECTIVE:   During the last visit (09/09/2020): A1c 7.4% We decreased Toujeo and started humaog per CS  Today (01/08/2021): Regina [name] is here for a follow up on diabetes management.  She is accompanied by her sister Regina [name] . She checks her blood sugars 1 times daily at night . The patient has not had hypoglycemic episodes since the last clinic visit  She is asking for a referral to discuss limiting eating greens with Warfarin  No vomiting or diarrhea     HOME DIABETES REGIMEN:   Toujeo 26 units daily   CF: Humalog: ( BG-120/30)   Statin: yes ACE-I/ARB: no    METER DOWNLOAD SUMMARY: Did not bring     DIABETIC COMPLICATIONS: Microvascular complications:   CKD III, Retinopathy ( hx of laser )   Denies: neuropathy   Last eye exam: Completed 2021   Macrovascular complications:   Non-obstructive CAD   Denies: PVD, CVA     HISTORY:  Past Medical History:  Past Medical History:  Diagnosis Date  . (HFpEF) heart failure with preserved ejection fraction  ([place])   . Back pain   . 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 Diabetes mellitus without complication ([place])   . Dyspnea   . GERD (gastroesophageal reflux disease)   . H/O echocardiogram    a. 01/2015 Echo: EF 55-60%, Gr 2 DD, mod LVH, mildly dil LA.  Marland Kitchen Heart disease   . Hypertension   . Hypothyroidism   . Joint pain   . Kidney problem   . Lower extremity edema   . Mini stroke ([place])   . Non-obstructive CAD    a. 01/2015 Cardiolite: + inf wall ischemia, EF 56%;  b. 01/2015 Cath: LM nl, LAD 81m LCX min irregs, RCA dominant, 50-613m . Sleep apnea   . Swallowing difficulty   . Thyroid disease    Past Surgical History:  Past Surgical History:  Procedure Laterality Date  . ABDOMINAL HYSTERECTOMY    . BUBBLE STUDY  07/01/2020   Procedure: BUBBLE STUDY;  Surgeon: AcElouise MunroeMD;  [facility]: Cardiovascular;;  . BUBBLE STUDY  08/05/2020   Procedure: BUBBLE STUDY;  Surgeon: O'Geralynn RileMD;  [facility]: Cardiovascular;;  done with definity   . CARDIAC CATHETERIZATION    . ESOPHAGOGASTRODUODENOSCOPY     a. 01/2015 EGD: patent esophagus.  . ESOPHAGOGASTRODUODENOSCOPY N/A 01/20/2015   Procedure: ESOPHAGOGASTRODUODENOSCOPY (EGD);  Surgeon:  Carol Ada, MD;  Location[facility] ENDOSCOPY;  Service: Endoscopy;  Laterality: N/A;  . HAND SURGERY    . JOINT REPLACEMENT     reports history bilateral TKA and right TSA  . LEFT HEART CATHETERIZATION WITH CORONARY ANGIOGRAM N/A 01/19/2015  . TEE WITHOUT CARDIOVERSION  05/04/2020  . TEE WITHOUT CARDIOVERSION N/A 05/05/2020   Procedure: TRANSESOPHAGEAL ECHOCARDIOGRAM (TEE);  Surgeon: Pixie Casino, MD;  Location: [facility] ENDOSCOPY;  Service: Cardiovascular;  Laterality: N/A;  . TEE WITHOUT CARDIOVERSION N/A 07/01/2020   Procedure: TRANSESOPHAGEAL ECHOCARDIOGRAM (TEE);  Surgeon: Elouise Munroe, MD;  Location: [facility] ENDOSCOPY;  Service: Cardiovascular;   Laterality: N/A;  . TEE WITHOUT CARDIOVERSION N/A 08/05/2020   Procedure: TRANSESOPHAGEAL ECHOCARDIOGRAM (TEE);  Surgeon: Geralynn Rile, MD;  Location: [place];  Service: Cardiovascular;  Laterality: N/A;  . THYROIDECTOMY      Social History:  reports that she has never smoked. She has never used smokeless tobacco. She reports that she does not drink alcohol. No history on file for drug use. Family History:  Family History  Problem Relation Age of Onset  . Heart disease Mother   . Hypertension Mother   . Cancer Father   . Alcoholism Father      HOME MEDICATIONS: Allergies as of 01/08/2021      Reactions   Ace Inhibitors Other (See Comments)   unknown   Amlodipine Other (See Comments)   unknown   Atenolol Other (See Comments)   bradycardia   Avandia [rosiglitazone] Other (See Comments)   unknown   Darvon [propoxyphene] Other (See Comments)   unknown   Erythromycin Itching   Hydralazine Other (See Comments)   Burning in throat and chest   Hydrocodone Other (See Comments)   Hallucinations.   Levofloxacin Itching   Morphine And Related Other (See Comments)   Dizzy and hallucianation, vomiting; Willing to try low dose   Percocet [oxycodone-acetaminophen] Other (See Comments)   hallucination   Spironolactone Other (See Comments)   unknown   Tramadol Other (See Comments)   Unknown/does not recall reaction but does not want to take again      Medication List       Accurate as of January 08, 2021  2:57 PM. If you have any questions, ask your nurse or doctor.        Accu-Chek [employer] Kit Use to test blood sugar up to 3 times daily   Accu-Chek Guide test strip Generic drug: glucose blood Use to test blood sugar up to 3 times daily   Accu-Chek Guide w/Device Kit 1 Device by Does not apply route 3 (three) times daily.   allopurinol 100 MG tablet Commonly known as: ZYLOPRIM Take 1 tablet by mouth once daily   atorvastatin 20 MG tablet Commonly  known as: LIPITOR Take 1 tablet (20 mg total) by mouth daily.   B-D SINGLE USE SWABS REGULAR Pads Use to test blood sugar up to 3 times daily   clotrimazole-betamethasone cream Commonly known as: Lotrisone Apply 1 application topically 2 (two) times daily as needed.   Dexcom G6 Sensor Misc 1 Device by Does not apply route as directed.   Dexcom G6 Transmitter Misc 1 Device by Does not apply route as directed. Started by: Dorita Sciara, MD   diclofenac Sodium 1 % Gel Commonly known as: VOLTAREN Apply 1 application topically daily as needed (arthritis pain/hands).   famotidine 20 MG tablet Commonly known as: PEPCID Take 1 tablet (20 mg total) by mouth at bedtime.  Fish Oil 1000 MG Caps Take 2,000 mg by mouth daily.   fluticasone 50 MCG/ACT nasal spray Commonly known as: FLONASE Place 1 spray into both nostrils daily.   gabapentin 600 MG tablet Commonly known as: NEURONTIN Take 1 tablet by mouth twice daily   GERITOL COMPLETE PO Take 1 tablet by mouth daily.   insulin lispro 100 UNIT/ML KwikPen Commonly known as: HumaLOG KwikPen Max daily 15 units   Insulin Pen Needle 32G X 4 MM Misc 1 Device by Does not apply route in the morning, at noon, in the evening, and at bedtime.   lactulose 10 GM/15ML solution Commonly known as: CHRONULAC Take 20 g by mouth daily as needed for mild constipation.   levothyroxine 150 MCG tablet Commonly known as: SYNTHROID Take 1 tablet (150 mcg total) by mouth daily before breakfast.   loratadine 10 MG tablet Commonly known as: CLARITIN Take 1 tablet (10 mg total) by mouth daily as needed. What changed: reasons to take this   magnesium oxide 400 (241.3 Mg) MG tablet Commonly known as: MAG-OX Take 1 tablet by mouth once daily   meclizine 25 MG tablet Commonly known as: ANTIVERT Take 25 mg by mouth daily as needed for dizziness.   metoprolol tartrate 100 MG tablet Commonly known as: LOPRESSOR Take 0.5 tablets (50 mg  total) by mouth 2 (two) times daily.   nitroGLYCERIN 0.4 MG SL tablet Commonly known as: NITROSTAT Place 1 tablet (0.4 mg total) under the tongue every 5 (five) minutes as needed for chest pain.   polyethylene glycol 17 g packet Commonly known as: MIRALAX / GLYCOLAX Take 17 g by mouth daily as needed for mild constipation.   SYSTANE BALANCE OP Place 1 drop into both eyes daily as needed (for dry eyes).   Tafamidis 61 MG Caps Take 61 mg by mouth daily.   torsemide 20 MG tablet Commonly known as: Demadex Take 2 tablets ( 40 mg ) daily   Toujeo SoloStar 300 UNIT/ML Solostar Pen Generic drug: insulin glargine (1 Unit Dial) Inject 26 Units into the skin daily. Eat a snack with protein nightly before bedtime.   vitamin C 1000 MG tablet Take 1,000 mg by mouth daily.   Vitamin D3 50 MCG (2000 UT) capsule Take 2,000 Units by mouth daily.   warfarin 5 MG tablet Commonly known as: COUMADIN Take as directed by the anticoagulation clinic. If you are unsure how to take this medication, talk to your nurse or doctor. Original instructions: Take 1 tablet (5 mg total) by mouth daily. What changed:   how much to take  when to take this  additional instructions        OBJECTIVE:   Vital Signs: BP 134/72 (BP Location: Left Arm, Patient Position: Sitting, Cuff Size: Large)   Pulse 78   Resp 20   Ht _0  (1.575 m)   Wt 217 lb 9.6 oz (98.7 kg)   SpO2 99%   BMI 39.80 kg/m   Wt Readings from Last 3 Encounters:  01/08/21 217 lb 9.6 oz (98.7 kg)  11/13/20 206 lb (93.4 kg)  09/30/20 213 lb 12.8 oz (97 kg)     Exam: General: Pt appears well and is in NAD  Lungs: Clear with good BS bilat   Heart: RRR   Extremities: No pretibial edema.   Neuro: MS is good with appropriate affect, pt is alert and Ox3     DM foot exam: 09/09/2020   The skin of the feet is intact without  sores or ulcerations. The pedal pulses are undetectable The sensation is decreased to a screening  5.07, 10 gram monofilament bilaterally   DATA REVIEWED:  Lab Results  Component Value Date   HGBA1C 7.4 (A) 01/08/2021   HGBA1C 7.4 (A) 09/09/2020   HGBA1C 7.6 (H) 03/24/2020   Lab Results  Component Value Date   MICROALBUR 1.9 05/22/2019   LDLCALC 49 03/24/2020   CREATININE 1.87 (H) 08/03/2020   Lab Results  Component Value Date   MICRALBCREAT 6.0 05/22/2019     Lab Results  Component Value Date   CHOL 117 03/24/2020   HDL 56 03/24/2020   LDLCALC 49 03/24/2020   TRIG 54 03/24/2020   CHOLHDL 2 05/22/2019         ASSESSMENT / PLAN / RECOMMENDATIONS:   1) Type 2 Diabetes Mellitus, Optimally controlled, With retinopathic, neuropathic , CKD III and Macrovascular   complications - Most recent A1c of 7.4 %. Goal A1c < 7.5 %.    - A1c stable and at goal  - NO glucose data today as she has  Only been checking once a day and using the humalog scale only once a day, I have encouraged her to check with each meal and use the scale if needed  - Her in-office BG today is 179 mg/dL  - She signed ROI to obtain her last eye exam, if retinopathy stable, will consider restarting GLP-1 agonist - Per pt her insurance company did not cover freestyle, will try Dexcom to ASPN     MEDICATIONS: Continue Toujeo  26 units daily  Correction Scale  : Humalog ( BG -   EDUCATION / INSTRUCTIONS:  BG monitoring instructions: Patient is instructed to check her blood sugars 3 times a day, before meals .  Call [place] [facility] clinic if: BG persistently < 70  . I reviewed the Rule of 15 for the treatment of hypoglycemia in detail with the patient. Literature supplied.     2) Diabetic complications:   Eye: Does  have known diabetic retinopathy.   Neuro/ Feet: Does have known diabetic peripheral neuropathy.  Renal: Patient does have known baseline CKD. She is not on an ACEI/ARB at present.     F/U in 4 months    Signed electronically by: Mack Guise,  MD  [facility]  [facility] [address] [place], [place] [address] Phone: [phone] FAX: [phone]   CC: [name], [name], [place] [address] Phone: [phone]  Fax: [phone]  Return to Endocrinology clinic as below: Future Appointments  Date Time Provider [place]  01/26/2021  8:45 AM Darreld Mclean, PA-C [facility]  02/05/2021  3:00 PM [facility]  05/14/2021  2:40 PM Deziah Renwick, Melanie Crazier, MD [facility] None

## 2021-01-08 NOTE — Telephone Encounter (Signed)
MEDICATION: test strips, needles for toujeo pen, alcohol swabs  PHARMACY:   [place]. Phone:  [phone]  Fax:  [phone]      HAS THE PATIENT CONTACTED [place]?  no  IS THIS A 90 DAY SUPPLY : pt doesn't know  IS PATIENT OUT OF MEDICATION: yes  IF NOT; HOW MUCH IS LEFT:   LAST APPOINTMENT DATE: @3 /25/2022  NEXT APPOINTMENT DATE:@7 /29/2022  DO WE HAVE YOUR PERMISSION TO LEAVE A DETAILED MESSAGE?:  OTHER COMMENTS:    **Let patient know to contact pharmacy at the end of the day to make sure medication is ready. **  ** Please notify patient to allow 48-72 hours to process**  **Encourage patient to contact the pharmacy for refills or they can request refills through [facility]**

## 2021-01-08 NOTE — Patient Instructions (Addendum)
-  Continue Toujeo  26 units daily  -Humalog correctional insulin:. Use the scale below to help guide you with Breakfast, lunch and Dinner   Blood sugar before meal Number of units to inject  Less than 150 0 unit  151 -  180 1 units  181 -  210 2 units  211 -  240 3 units  241 -  270 4 units  271 -  300 5 units     HOW TO TREAT LOW BLOOD SUGARS (Blood sugar LESS THAN 70 MG/DL)  Please follow the RULE OF 15 for the treatment of hypoglycemia treatment (when your (blood sugars are less than 70 mg/dL)    STEP 1: Take 15 grams of carbohydrates when your blood sugar is low, which includes:   3-4 GLUCOSE TABS  OR  3-4 OZ OF JUICE OR REGULAR SODA OR  ONE TUBE OF GLUCOSE GEL     STEP 2: RECHECK blood sugar in 15 MINUTES STEP 3: If your blood sugar is still low at the 15 minute recheck --> then, go back to STEP 1 and treat AGAIN with another 15 grams of carbohydrates.

## 2021-01-11 MED ORDER — INSULIN PEN NEEDLE 32G X 4 MM MISC: 1.0000 | Freq: Four times a day (QID) | 6 refills | Status: DC

## 2021-01-11 MED ORDER — ACCU-CHEK GUIDE VI STRP: ORAL_STRIP | 3 refills | Status: DC

## 2021-01-11 MED ORDER — BD SWAB SINGLE USE REGULAR PADS: MEDICATED_PAD | 3 refills | Status: AC

## 2021-01-11 NOTE — Telephone Encounter (Signed)
Refill sent as requested. 

## 2021-01-18 NOTE — Progress Notes (Signed)
Cardiology Office Note:    Date:  01/26/2021   ID:  ANNER BAITY, DOB 1941/02/12, MRN [id number]  PCP:  [name], [name], MD  Cardiologist:  Evalina Field, MD  Electrophysiologist:  None   Referring MD: [name], [name], MD   Chief Complaint: follow-up of cardiac amyloidosis   History of Present Illness:    Regina [name] is a 80 y.o. female with a history of non-obstructive CAD on cardiac cath in 01/2015, cardiac amyloidosis (hATTR), chronic systolic CHF with EF of 37% on cardiac MRI in 10/2020, persistent atrial  Fibrillation on Couamdin, persistent left atrial appendage thrombus on multiple TEEs, prior CVA, hypertension, type 2 diabetes mellitus, CKD stage IV, hypothyroidism, GERD who is followed by Dr. Audie Box and presents today for follow-up of cardiac amyloidosis.  Patient has cardiac amyloidosis. Echo in 03/2020 showed LVEF of 55-60% with normal wall motion, moderate LVH, grade 2 diastolic dysfunction, and mild MR. PYP scan in 04/2020 was strongly compatible with TTR amyloidosis. Cardiac MRI in 10/2020 showed LVEF of 30%, RVEF of 30%, and LGE pattern highly suggestive of cardiac amyloidosis. Genetic testing also confirmed Val142lle mutation. She has been seen by Dr. Haroldine Laws and is now on Tafamidis.  Patient also has history of persistent atrial fibrillation. DCCV has been planned multiple times but unable to be performed due to persistent left atrial thrombus on TEE. Last attempt was 07/2020. At visit in 07/2020 she reported worsening chest pressure with exertion. Given CKD stage IV, Myoview was ordered and was negative for any ischemia. Patient was last seen by Dr. Audie Box in 10/2020 at which time she was doing well form a cardiac standpoint. She remained in atrial fibrillation at that visit but was rate controlled. Per Dr. Kathalene Frames note, Amiodarone woule be a rate control option if BP worsens but this would likely convert her back to rhythm and predispose her to stroke given left atrial  appendage thrombus. Given she was stable, decision was made to hold off on this.  Patient presents today for follow-up. Here with granddaughter who is a Marine scientist. Since last visit, she was admitted at [facility] in 11/2020 for a stroke after presenting with bilateral upper extremity weakness. Head CT showed no acute findings but brain MRI showed small likely subacute infarct in the right precentral gyrus and small scattered microhemorrhages. Echo with bubble study showed LVEF of 35% with moderate LVH and mild RV systolic dysfunction but negative saline microcavitation study. CVA was felt to be secondary to left atrial appendage thrombus. She was seen for follow-up by Neurology at [facility] on 01/07/2021 at which time she denied any recurrent upper extremity weakness or other concerning symptoms for TIA/CVA. Neurology recommended considering alternate DOAC or Pradaxa given persistent left atrial appendage thrombus. At today's visit, she reports 2 different types of chest pain - a pinching sensation under her left breast as well as a dullness across her chest. Both of these pains occur both at rest and with exertion but are not worse with exertion. However, she states she is able to walk up 39 steps without worsening chest pain. She is up 7lbs pounds from yesterday after eating fried chicken yesterday. She has mild dyspnea on exertion which is stable. No new orthopnea. No PND. No significant lower extremity edema. She notes occasional dizziness at rest but no palpitations or syncope. We had a long discussion about about re-attempting TEE/DCCV. Dr. Audie Box also came in and discussed this with patient.   Past Medical History:  Diagnosis Date  . (  HFpEF) heart failure with preserved ejection fraction ([place])   . Back pain   . 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 Diabetes mellitus without complication ([place])   . Dyspnea   . GERD (gastroesophageal reflux disease)   . H/O  echocardiogram    a. 01/2015 Echo: EF 55-60%, Gr 2 DD, mod LVH, mildly dil LA.  Marland Kitchen Heart disease   . Hypertension   . Hypothyroidism   . Joint pain   . Kidney problem   . Lower extremity edema   . Mini stroke ([place])   . Non-obstructive CAD    a. 01/2015 Cardiolite: + inf wall ischemia, EF 56%;  b. 01/2015 Cath: LM nl, LAD 8m LCX min irregs, RCA dominant, 50-667m . Sleep apnea   . Swallowing difficulty   . Thyroid disease     Past Surgical History:  Procedure Laterality Date  . ABDOMINAL HYSTERECTOMY    . BUBBLE STUDY  07/01/2020   Procedure: BUBBLE STUDY;  Surgeon: AcElouise MunroeMD;  [place] [facility]: Cardiovascular;;  . BUBBLE STUDY  08/05/2020   Procedure: BUBBLE STUDY;  Surgeon: O'Geralynn RileMD;  [place] [facility]: Cardiovascular;;  done with definity   . CARDIAC CATHETERIZATION    . ESOPHAGOGASTRODUODENOSCOPY     a. 01/2015 EGD: patent esophagus.  . ESOPHAGOGASTRODUODENOSCOPY N/A 01/20/2015   Procedure: ESOPHAGOGASTRODUODENOSCOPY (EGD);  Surgeon: PaCarol AdaMD;  [facility];  Service: Endoscopy;  Laterality: N/A;  . HAND SURGERY    . JOINT REPLACEMENT     reports history bilateral TKA and right TSA  . LEFT HEART CATHETERIZATION WITH CORONARY ANGIOGRAM N/A 01/19/2015  . TEE WITHOUT CARDIOVERSION  05/04/2020  . TEE WITHOUT CARDIOVERSION N/A 05/05/2020   Procedure: TRANSESOPHAGEAL ECHOCARDIOGRAM (TEE);  Surgeon: HiPixie CasinoMD;  [facility];  Service: Cardiovascular;  Laterality: N/A;  . TEE WITHOUT CARDIOVERSION N/A 07/01/2020   Procedure: TRANSESOPHAGEAL ECHOCARDIOGRAM (TEE);  Surgeon: AcElouise MunroeMD;  [facility];  Service: Cardiovascular;  Laterality: N/A;  . TEE WITHOUT CARDIOVERSION N/A 08/05/2020   Procedure: TRANSESOPHAGEAL ECHOCARDIOGRAM (TEE);  Surgeon: O'Geralynn RileMD;  [facility]: Cardiovascular;  Laterality: N/A;  . THYROIDECTOMY      Current  Medications: Current Meds  Medication Sig  . Alcohol Swabs (B-D SINGLE USE SWABS REGULAR) PADS Use to test blood sugar up to 3 times daily  . allopurinol (ZYLOPRIM) 100 MG tablet Take 1 tablet by mouth once daily  . Ascorbic Acid (VITAMIN C) 1000 MG tablet Take 1,000 mg by mouth daily.  . Marland Kitchentorvastatin (LIPITOR) 20 MG tablet Take 1 tablet (20 mg total) by mouth daily.  . Blood Glucose Monitoring Suppl (ACCU-CHEK GUIDE) w/Device KIT 1 Device by Does not apply route 3 (three) times daily.  . cetirizine (ZYRTEC ALLERGY) 10 MG tablet Take 10 mg by mouth daily.  . Cholecalciferol (VITAMIN D3) 50 MCG (2000 UT) capsule Take 2,000 Units by mouth daily.  . clotrimazole-betamethasone (LOTRISONE) cream Apply 1 application topically 2 (two) times daily as needed.  . Continuous Blood Gluc Sensor (DEXCOM G6 SENSOR) MISC 1 Device by Does not apply route as directed.  . Continuous Blood Gluc Transmit (DEXCOM G6 TRANSMITTER) MISC 1 Device by Does not apply route as directed.  . diclofenac Sodium (VOLTAREN) 1 % GEL Apply 1 application topically daily as needed (arthritis pain/hands).  . famotidine (PEPCID) 20 MG tablet Take 1 tablet (  20 mg total) by mouth at bedtime.  Marland Kitchen FARXIGA 10 MG TABS tablet Take 10 mg by mouth daily.  . fluticasone (FLONASE) 50 MCG/ACT nasal spray Place 1 spray into both nostrils daily.  Marland Kitchen gabapentin (NEURONTIN) 600 MG tablet Take 1 tablet by mouth twice daily  . glucose blood (ACCU-CHEK GUIDE) test strip Use to test blood sugar up to 3 times daily  . insulin glargine, 1 Unit Dial, (TOUJEO SOLOSTAR) 300 UNIT/ML Solostar Pen Inject 26 Units into the skin daily. Eat a snack with protein nightly before bedtime.  . insulin lispro (HUMALOG KWIKPEN) 100 UNIT/ML KwikPen Max daily 15 units  . Insulin Pen Needle 32G X 4 MM MISC 1 Device by Does not apply route in the morning, at noon, in the evening, and at bedtime.  Marland Kitchen lactulose (CHRONULAC) 10 GM/15ML solution Take 20 g by mouth daily as needed for  mild constipation.   . Lancets Misc. (ACCU-CHEK FASTCLIX LANCET) KIT Use to test blood sugar up to 3 times daily  . levothyroxine (SYNTHROID) 150 MCG tablet Take 1 tablet (150 mcg total) by mouth daily before breakfast.  . magnesium oxide (MAG-OX) 400 (241.3 Mg) MG tablet Take 1 tablet by mouth once daily  . meclizine (ANTIVERT) 25 MG tablet Take 25 mg by mouth daily as needed for dizziness.   . metoprolol tartrate (LOPRESSOR) 100 MG tablet Take 0.5 tablets (50 mg total) by mouth 2 (two) times daily.  . nitroGLYCERIN (NITROSTAT) 0.4 MG SL tablet Place 1 tablet (0.4 mg total) under the tongue every 5 (five) minutes as needed for chest pain.  . Omega-3 Fatty Acids (FISH OIL) 1000 MG CAPS Take 2,000 mg by mouth daily.   Marland Kitchen Propylene Glycol (SYSTANE BALANCE OP) Place 1 drop into both eyes daily as needed (for dry eyes).  . Tafamidis 61 MG CAPS Take 61 mg by mouth daily.  Marland Kitchen torsemide (DEMADEX) 20 MG tablet Take 2 tablets ( 40 mg ) daily  . warfarin (COUMADIN) 5 MG tablet Take 1 tablet (5 mg total) by mouth daily. (Patient taking differently: Take 2.5 mg by mouth See admin instructions. Take 2.5 mg Mon., Wed., Fri. Take 5 mg all the other days at bedtime)     Allergies:   Ace inhibitors, Amlodipine, Atenolol, Avandia [rosiglitazone], Darvon [propoxyphene], Erythromycin, Hydralazine, Hydrocodone, Levofloxacin, Morphine and related, Percocet [oxycodone-acetaminophen], Spironolactone, and Tramadol   Social History   Socioeconomic History  . Marital status: Widowed    Spouse name: Not on file  . Number of children: Not on file  . Years of education: Not on file  . Highest education level: Not on file  Occupational History  . Occupation: Retired  Tobacco Use  . Smoking status: Never Smoker  . Smokeless tobacco: Never Used  Vaping Use  . Vaping Use: Never used  Substance and Sexual Activity  . Alcohol use: No  . Drug use: Not on file  . Sexual activity: Not Currently  Other Topics Concern  .  Not on file  Social History Narrative   Lives with daughter   Social Determinants of Health   Financial Resource Strain: Not on file  Food Insecurity: Not on file  Transportation Needs: Not on file  Physical Activity: Not on file  Stress: Not on file  Social Connections: Not on file     Family History: The patient's family history includes Alcoholism in her father; Cancer in her father; Heart disease in her mother; Hypertension in her mother.  ROS:   Please see  the history of present illness.     EKGs/Labs/Other Studies Reviewed:    The following studies were reviewed today:  TTE 04/03/2020: Impressions: 1. Left ventricular ejection fraction, by estimation, is 55 to 60%. The  left ventricle has normal function. The left ventricle has no regional  wall motion abnormalities. There is moderate concentric left ventricular  hypertrophy. Left ventricular  diastolic parameters are consistent with Grade II diastolic dysfunction  (pseudonormalization). Elevated left atrial pressure.  2. Right ventricular systolic function is normal. The right ventricular  size is normal. There is normal pulmonary artery systolic pressure.  3. Left atrial size was mildly dilated.  4. The mitral valve is normal in structure. Mild mitral valve  regurgitation. No evidence of mitral stenosis.  5. The aortic valve is normal in structure. Aortic valve regurgitation is  trivial. No aortic stenosis is present.  6. The inferior vena cava is normal in size with greater than 50%  respiratory variability, suggesting right atrial pressure of 3 mmHg.  _______________  PYP Scan 04/16/2020: Abnormal Tc63mpyrophosphate scan strongly compatible with TTR-amyloidosis. _______________  TEE 08/05/2020: Impressions: 1. There is sludge/thrombus in the LAA. Contrast imaging also  demonstrated incomplete filling of the LAA. DCCV was not performed. 3d  imaging also demonstrates likely thrombus. Left atrial size was  mildly  dilated. A left atrial/left atrial appendage  thrombus was detected. The LAA emptying velocity was 16 cm/s.  2. Severely reduced LV function, EF 10-15%. LVOT VTI 9.6 cm. CO estimated  3.4 L/min and CI 1.8 L/min/m2. Left ventricular ejection fraction, by  estimation, is 10-15%. The left ventricle has severely decreased function.  There is severe concentric left  ventricular hypertrophy.  3. Right ventricular systolic function is severely reduced. The right  ventricular size is mildly enlarged.  4. The mitral valve is grossly normal. Trivial mitral valve  regurgitation. No evidence of mitral stenosis.  5. The aortic valve is tricuspid. Aortic valve regurgitation is not  visualized. No aortic stenosis is present.  _______________  MBrantley Fling10/29/2021:  Lexiscan stress is electrically negative for ischemia  Myovue scan shows normal perfusion. No signficant ischemia or scar.  LVEF is calculated at 29% with diffuse hypokinesis LV is dilated.  Compared to Myoview in 2016, perfusion is normal but LVEF is now significantly decreased. _______________  Cardiac MRI 10/23/2020: Impressions: 1. Normal LV size with moderate LV hypertrophy. The LV is hypokinetic towards the base and more normokinetic towards the apex. EF 30%. 2.  Normal RV size with EF 30%. 3. LGE pattern as well as high extracellular volume percentage (67%) are highly suggestive of cardiac amyloidosis.  EKG:  EKG not ordered today. EKG personally reviewed and demonstrates atrial fibrillation, rate 58 bpm, with non-specific ST/T changes. No acute changes from prior tracing.  Recent Labs: 04/25/2020: ALT 18; Magnesium 1.9 05/28/2020: BNP 630.4 06/08/2020: TSH 3.24 08/03/2020: BUN 52; Creatinine, Ser 1.87; Hemoglobin 12.1; Platelets 362; Potassium 4.3; Sodium 140  Recent Lipid Panel    Component Value Date/Time   CHOL 117 03/24/2020 1428   TRIG 54 03/24/2020 1428   HDL 56 03/24/2020 1428   CHOLHDL 2 05/22/2019  1631   VLDL 23.8 05/22/2019 1631   LDLCALC 49 03/24/2020 1428    Physical Exam:    Vital Signs: BP 132/70   Pulse (!) 58   Ht _0  (1.575 m)   Wt 220 lb 6.4 oz (100 kg)   SpO2 96%   BMI 40.31 kg/m     Wt Readings from Last  3 Encounters:  01/26/21 220 lb 6.4 oz (100 kg)  01/08/21 217 lb 9.6 oz (98.7 kg)  11/13/20 206 lb (93.4 kg)     General: 80 y.o. African-American female in no acute distress. HEENT: Normocephalic and atraumatic. Sclera clear.  Neck: Supple. No JVD. Heart: Irregularly irregular rhythm with normal rate. Distinct S1 and S2. No murmurs, gallops, or rubs. Radial and distal pedal pulses 2+ and equal bilaterally. Lungs: No increased work of breathing. Clear to ausculation bilaterally. No wheezes, rhonchi, or rales.  Abdomen: Soft, slightly distended, and non-tender to palpation.  Extremities: No significant lower extremity edema.  Skin: Warm and dry. Neuro: Alert and oriented x3. No focal deficits. Psych: Normal affect. Responds appropriately.  Assessment:    1. Hereditary cardiac amyloidosis ([place])   2. Chronic combined systolic and diastolic CHF (congestive heart failure) (HCC)   3. Persistent atrial fibrillation (HCC)   4. Persistent left atrial appendage thrombus   5. Chest pain, unspecified type   6. Coronary artery disease involving native coronary artery of native heart with other form of angina pectoris ([place])   7. Primary hypertension   8. Type 2 diabetes mellitus with complication, with long-term current use of insulin ([place])   9. Chronic kidney disease (CKD), stage IV (severe) (HCC)     Plan:    Hereditary Cardiac Amyloidosis Chronic Combined CHF - PYP scan and MRI highly suggestive of cardiac amyloidosis and genetic testing confirmed Val142lle mutation. Complicated by persistent atrial fibrillation (unable to undergo DCCV due to persistent left atrial appendage thrombus). - LVEF 30% on MRI in 10/2020. Drop in EF felt to likely be related to  atrial fibrillation. - Mild abdominal distension today and weight up 7lbs from yesterday on patient's home scale (she at fried chicken last night). She has not taken her Torsemide for today. - Continue Torsemide 49m daily. Advised patient that if weight has not returned to baseline tomorrow after Torsemide dose today, then she can take and extra dose of Torsemide tomorrow afternoon. She will let uKoreaknow if weight do not improve. Will have to be careful if additional diuresis is needed given renal function. - Continue Tafamidis for amyloid. No issues with cost.  - Continue Lopressor 546mtwice daily.  - Continue Farxiga 1039maily.  - No ACEi/ARB/ARNI or MRA due to significant CKD.  - Discussed importance of daily weight and sodium/fluid restrictions.   Persistent Atrial Fibrillation Persistent Left Atrial Appendage Thrombus - Rate controlled.  - Continue Lopressor 36m64mice daily. - Continue Coumadin. Followed in our Coumadin Clinic. - Attempted DCCV 3 times during 2021; however, TEE each time showed persistent left atrial sludge and thrombus. Dr. O'NeAudie Box I did discuss with patient possibly re-attempting another TEE/DCCV. However, we suspect this may be unsuccessful again given persistent thrombus on 3 TEEs last year. Patient/family will let us kKoreaw if they wish to pursue this. Per Dr. O'NeKathalene Framest note: Amiodarone would be a rate control option if BP worsens but this would likely convert her back to rhythm and predispose her to stroke. However, BP currently stable.  Of note, recently had a stroke felt to be secondary to left atrial appendage thrombus. Neurology recommended considering Pradaxa. Reviewed UpToDate, Pradaxa can be used as long as creatinine clearance > 15 mL/min. Discussed with Dr. O'NeAudie Box would prefer patient stay on Coumadin for now. Patient and family agreeable to this.   Chest Pain Non-Obstructive CAD - History of non-obstructive CAD [place] cardiac [email or web address]  Myoview in 07/2020 was low risk with no evidence of ischemia. She does report chest pain both at rest and with exertion but not worse with exertion. - EKG today shows no acute ischemic changes.  - No Aspirin while on Coumadin. - Continue statin. - Discussed with Dr. Audie Box - patient tends to have chest pain when she is volume overloaded and patient reports being up 7lbs compared to yesterday. Therefore, this is felt to be the cause. Continue diuresis as above. No additional ischemic work-up needed at this time.   Hypertension - BP well controlled.  - Continue Lopressor as above.   Type 2 Diabetes Mellitus - Managed by Endocrinology (Dr. Kelton Pillar).  CKD Stage IV - Creatinine 2.1 on CMET in 11/2020. Baseline around 1.6 to 2.1.  - Followed by Nephrology (Dr. Joylene Grapes).  Disposition: Follow up with Dr. Audie Box in 3 months.   Medication Adjustments/Labs and Tests Ordered: Current medicines are reviewed at length with the patient today.  Concerns regarding medicines are outlined above.  Orders Placed This Encounter  Procedures  . EKG 12-Lead   No orders of the defined types were placed in this encounter.   Patient Instructions  Medication Instructions:   You may take an extra dose of Torsemide tomorrow 04/13 if your weights are not back within normal range for you as discussed by your Physician  *If you need a refill on your cardiac medications before your next appointment, please call your pharmacy*  Lab Work: NONE ordered at this time of appointment   If you have labs (blood work) drawn today and your tests are completely normal, you will receive your results only by: Marland Kitchen MyChart Message (if you have MyChart) OR . A paper copy in the mail If you have any lab test that is abnormal or we need to change your treatment, we will call you to review the results.  Testing/Procedures: NONE ordered at this time of appointment   Follow-Up: At [facility], you and your health needs  are our priority.  As part of our continuing mission to provide you with exceptional heart care, we have created designated Provider Care Teams.  These Care Teams include your primary Cardiologist (physician) and Advanced Practice Providers (APPs -  Physician Assistants and Nurse Practitioners) who all work together to provide you with the care you need, when you need it.  Your next appointment:   3 month(s)  The format for your next appointment:   In Person  Provider:   [name], MD  Other Instructions      Signed, Darreld Mclean, PA-C  01/26/2021 7:57 PM    [place]

## 2021-01-21 ENCOUNTER — Telehealth: Payer: Self-pay | Admitting: Internal Medicine

## 2021-01-21 NOTE — Telephone Encounter (Signed)
[place] Socks with [facility] to check status of a request for CMN and last office notes that was sent on 01/19/21.

## 2021-01-21 NOTE — Telephone Encounter (Signed)
Noted. Was received and placed in Dr [facility] inbox.

## 2021-01-26 ENCOUNTER — Other Ambulatory Visit: Payer: Self-pay

## 2021-01-26 ENCOUNTER — Ambulatory Visit: Payer: Medicare Other | Admitting: Student

## 2021-01-26 ENCOUNTER — Encounter: Payer: Self-pay | Admitting: Student

## 2021-01-26 VITALS — BP 132/70 | HR 58 | Ht 62.0 in | Wt 220.4 lb

## 2021-01-26 DIAGNOSIS — I5042 Chronic combined systolic (congestive) and diastolic (congestive) heart failure: Secondary | ICD-10-CM

## 2021-01-26 DIAGNOSIS — I25118 Atherosclerotic heart disease of native coronary artery with other forms of angina pectoris: Secondary | ICD-10-CM

## 2021-01-26 DIAGNOSIS — I513 Intracardiac thrombosis, not elsewhere classified: Secondary | ICD-10-CM

## 2021-01-26 DIAGNOSIS — I10 Essential (primary) hypertension: Secondary | ICD-10-CM

## 2021-01-26 DIAGNOSIS — Z794 Long term (current) use of insulin: Secondary | ICD-10-CM | POA: Diagnosis not present

## 2021-01-26 DIAGNOSIS — E118 Type 2 diabetes mellitus with unspecified complications: Secondary | ICD-10-CM

## 2021-01-26 DIAGNOSIS — I4819 Other persistent atrial fibrillation: Secondary | ICD-10-CM | POA: Diagnosis not present

## 2021-01-26 DIAGNOSIS — N184 Chronic kidney disease, stage 4 (severe): Secondary | ICD-10-CM

## 2021-01-26 DIAGNOSIS — E854 Organ-limited amyloidosis: Secondary | ICD-10-CM | POA: Diagnosis not present

## 2021-01-26 DIAGNOSIS — R079 Chest pain, unspecified: Secondary | ICD-10-CM | POA: Diagnosis not present

## 2021-01-26 DIAGNOSIS — I43 Cardiomyopathy in diseases classified elsewhere: Secondary | ICD-10-CM | POA: Diagnosis not present

## 2021-01-26 NOTE — Telephone Encounter (Signed)
Faxed progress notes as requested.

## 2021-01-26 NOTE — Patient Instructions (Addendum)
Medication Instructions:   You may take an extra dose of Torsemide tomorrow 04/13 if your weights are not back within normal range for you as discussed by your Physician  *If you need a refill on your cardiac medications before your next appointment, please call your pharmacy*  Lab Work: NONE ordered at this time of appointment   If you have labs (blood work) drawn today and your tests are completely normal, you will receive your results only by: Marland Kitchen MyChart Message (if you have MyChart) OR . A paper copy in the mail If you have any lab test that is abnormal or we need to change your treatment, we will call you to review the results.  Testing/Procedures: NONE ordered at this time of appointment   Follow-Up: At [facility], you and your health needs are our priority.  As part of our continuing mission to provide you with exceptional heart care, we have created designated Provider Care Teams.  These Care Teams include your primary Cardiologist (physician) and Advanced Practice Providers (APPs -  Physician Assistants and Nurse Practitioners) who all work together to provide you with the care you need, when you need it.  Your next appointment:   3 month(s)  The format for your next appointment:   In Person  Provider:   [name], MD  Other Instructions

## 2021-01-26 NOTE — Telephone Encounter (Signed)
Loa Socks with [facility] called again re: [facility] received the CMN but did not receive the last office notes. Loa Socks requests the last office notes be faxed to fax# [phone]

## 2021-02-02 NOTE — Telephone Encounter (Signed)
Patient called to check status of PA - advised status 01/26/21

## 2021-02-05 ENCOUNTER — Ambulatory Visit (INDEPENDENT_AMBULATORY_CARE_PROVIDER_SITE_OTHER): Payer: Medicare Other

## 2021-02-05 ENCOUNTER — Other Ambulatory Visit: Payer: Self-pay

## 2021-02-05 ENCOUNTER — Telehealth: Payer: Self-pay | Admitting: Family Medicine

## 2021-02-05 DIAGNOSIS — Z7901 Long term (current) use of anticoagulants: Secondary | ICD-10-CM | POA: Diagnosis not present

## 2021-02-05 DIAGNOSIS — I4819 Other persistent atrial fibrillation: Secondary | ICD-10-CM

## 2021-02-05 LAB — POCT INR: INR: 2.4 (ref 2.0–3.0)

## 2021-02-05 NOTE — Telephone Encounter (Signed)
Patient is calling and stated that she is experiencing cramps in her legs and fingers and wanted to see if provider would recommend her going back on potassium, please advise. CB is [phone]

## 2021-02-05 NOTE — Patient Instructions (Signed)
Continue taking 1/2 tablet daily except 1 tablet each Sunday.  Repeat INR in 6 weeks

## 2021-02-05 NOTE — Telephone Encounter (Signed)
Please advise 

## 2021-02-09 DIAGNOSIS — E119 Type 2 diabetes mellitus without complications: Secondary | ICD-10-CM | POA: Diagnosis not present

## 2021-02-09 DIAGNOSIS — Z794 Long term (current) use of insulin: Secondary | ICD-10-CM | POA: Diagnosis not present

## 2021-02-09 NOTE — Telephone Encounter (Signed)
Potassium was check in 11/2020 and it was normal, so not sure if hypoK+ is causing cramps. She is due for follow-up, so we can re-check it next visit. Thanks, BJ

## 2021-02-09 NOTE — Telephone Encounter (Signed)
I spoke with patient. We went over the message below & she verbalized understanding. Appointment made for 5/2 at 11am.

## 2021-02-10 ENCOUNTER — Ambulatory Visit (INDEPENDENT_AMBULATORY_CARE_PROVIDER_SITE_OTHER): Payer: Medicare Other

## 2021-02-10 ENCOUNTER — Other Ambulatory Visit: Payer: Self-pay

## 2021-02-10 DIAGNOSIS — Z0000 Encounter for general adult medical examination without abnormal findings: Secondary | ICD-10-CM | POA: Diagnosis not present

## 2021-02-10 NOTE — Progress Notes (Signed)
Virtual Visit via Telephone Note  I connected with  Regina [name] on 02/10/21 at  9:30 AM EDT by telephone and verified that I am speaking with the correct person using two identifiers.  Location: Patient: Home Provider: Office Persons participating in the virtual visit: patient/Nurse Health Advisor   I discussed the limitations, risks, security and privacy concerns of performing an evaluation and management service by telephone and the availability of in person appointments. The patient expressed understanding and agreed to proceed.  Interactive audio and video telecommunications were attempted between this nurse and patient, however failed, due to patient having technical difficulties OR patient did not have access to video capability.  We continued and completed visit with audio only.  Some vital signs may be absent or patient reported.   Willette Brace, LPN    Subjective:   Regina [name] is a 80 y.o. female who presents for an Initial Medicare Annual Wellness Visit.  Review of Systems     Cardiac Risk Factors include: advanced age (>80mn, >>69women);hypertension;diabetes mellitus;dyslipidemia;obesity (BMI >30kg/m2)     Objective:    There were no vitals filed for this visit. There is no height or weight on file to calculate BMI.  Advanced Directives 02/10/2021 10/07/2020 08/05/2020 07/01/2020 05/06/2020 04/24/2020 06/17/2019  Does Patient Have a Medical Advance Directive? _0  No Yes  Type of [place]amed[place] - Living will;Healthcare Power of Attorney  Does patient want to make changes to medical advance directive? - - - - No - Patient declined - No - Patient declined  Copy of [place] Chart? No - copy requested No - copy requested No - copy  requested No - copy requested No - copy requested - No - copy requested  Would patient like information on creating a medical advance directive? - - - - No - Patient declined No - Patient declined -    Current Medications (verified) Outpatient Encounter Medications as of 02/10/2021  Medication Sig  . Alcohol Swabs (B-D SINGLE USE SWABS REGULAR) PADS Use to test blood sugar up to 3 times daily  . allopurinol (ZYLOPRIM) 100 MG tablet Take 1 tablet by mouth once daily  . Ascorbic Acid (VITAMIN C) 1000 MG tablet Take 1,000 mg by mouth daily.  .Marland Kitchenatorvastatin (LIPITOR) 20 MG tablet Take 1 tablet (20 mg total) by mouth daily.  . Blood Glucose Monitoring Suppl (ACCU-CHEK GUIDE) w/Device KIT 1 Device by Does not apply route 3 (three) times daily.  . cetirizine (ZYRTEC) 10 MG tablet Take 10 mg by mouth daily.  . Cholecalciferol (VITAMIN D3) 50 MCG (2000 UT) capsule Take 2,000 Units by mouth daily.  . clotrimazole-betamethasone (LOTRISONE) cream Apply 1 application topically 2 (two) times daily as needed.  . Continuous Blood Gluc Sensor (DEXCOM G6 SENSOR) MISC 1 Device by Does not apply route as directed.  . diclofenac Sodium (VOLTAREN) 1 % GEL Apply 1 application topically daily as needed (arthritis pain/hands).  . famotidine (PEPCID) 20 MG tablet Take 1 tablet (20 mg total) by mouth at bedtime.  .Marland KitchenFARXIGA 10 MG TABS tablet Take 10 mg by mouth daily.  . fluticasone (FLONASE) 50 MCG/ACT nasal spray Place 1 spray into both nostrils daily.  .Marland Kitchengabapentin (NEURONTIN) 600 MG tablet Take 1 tablet by mouth twice daily  . glucose blood (ACCU-CHEK GUIDE) test  strip Use to test blood sugar up to 3 times daily  . insulin glargine, 1 Unit Dial, (TOUJEO SOLOSTAR) 300 UNIT/ML Solostar Pen Inject 26 Units into the skin daily. Eat a snack with protein nightly before bedtime.  . insulin lispro (HUMALOG KWIKPEN) 100 UNIT/ML KwikPen Max daily 15 units  . Insulin Pen Needle 32G X 4 MM MISC 1 Device by Does not apply route  in the morning, at noon, in the evening, and at bedtime.  Marland Kitchen lactulose (CHRONULAC) 10 GM/15ML solution Take 20 g by mouth daily as needed for mild constipation.   . Lancets Misc. (ACCU-CHEK FASTCLIX LANCET) KIT Use to test blood sugar up to 3 times daily  . levothyroxine (SYNTHROID) 150 MCG tablet Take 1 tablet (150 mcg total) by mouth daily before breakfast.  . magnesium oxide (MAG-OX) 400 (241.3 Mg) MG tablet Take 1 tablet by mouth once daily  . meclizine (ANTIVERT) 25 MG tablet Take 25 mg by mouth daily as needed for dizziness.   . metoprolol tartrate (LOPRESSOR) 100 MG tablet Take 0.5 tablets (50 mg total) by mouth 2 (two) times daily.  . nitroGLYCERIN (NITROSTAT) 0.4 MG SL tablet Place 1 tablet (0.4 mg total) under the tongue every 5 (five) minutes as needed for chest pain.  . Omega-3 Fatty Acids (FISH OIL) 1000 MG CAPS Take 2,000 mg by mouth daily.   Marland Kitchen Propylene Glycol (SYSTANE BALANCE OP) Place 1 drop into both eyes daily as needed (for dry eyes).  . Tafamidis 61 MG CAPS Take 61 mg by mouth daily.  Marland Kitchen torsemide (DEMADEX) 20 MG tablet Take 2 tablets ( 40 mg ) daily  . warfarin (COUMADIN) 5 MG tablet Take 1 tablet (5 mg total) by mouth daily. (Patient taking differently: Take 2.5 mg by mouth See admin instructions. Take 2.5 mg Mon., Wed., Fri. Take 5 mg all the other days at bedtime)  . Continuous Blood Gluc Transmit (DEXCOM G6 TRANSMITTER) MISC 1 Device by Does not apply route as directed. (Patient not taking: Reported on 02/10/2021)   No facility-administered encounter medications on file as of 02/10/2021.    Allergies (verified) Ace inhibitors, Amlodipine, Atenolol, Avandia [rosiglitazone], Darvon [propoxyphene], Erythromycin, Hydralazine, Hydrocodone, Levofloxacin, Morphine and related, Percocet [oxycodone-acetaminophen], Spironolactone, and Tramadol   History: Past Medical History:  Diagnosis Date  . Back pain   . 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 Chronic combined systolic and diastolic CHF (congestive heart failure) ([place])   . Diabetes mellitus without complication ([place])   . Dyspnea   . GERD (gastroesophageal reflux disease)   . H/O echocardiogram    a. 01/2015 Echo: EF 55-60%, Gr 2 DD, mod LVH, mildly dil LA.  Marland Kitchen Heart disease   . Hypertension   . Hypothyroidism   . Joint pain   . Kidney problem   . Lower extremity edema   . Mini stroke ([place])   . Non-obstructive CAD    a. 01/2015 Cardiolite: + inf wall ischemia, EF 56%;  b. 01/2015 Cath: LM nl, LAD 43m LCX min irregs, RCA dominant, 50-676m . Sleep apnea   . Swallowing difficulty   . Thyroid disease    Past Surgical History:  Procedure Laterality Date  . ABDOMINAL HYSTERECTOMY    . BUBBLE STUDY  07/01/2020   Procedure: BUBBLE STUDY;  Surgeon: AcElouise MunroeMD;  [facility];;  . BUBBLE STUDY  08/05/2020   Procedure: BUBBLE STUDY;  Surgeon: O'Neal,  Cassie Freer, MD;  Location: [facility] ENDOSCOPY;  Service: Cardiovascular;;  done with definity   . CARDIAC CATHETERIZATION    . ESOPHAGOGASTRODUODENOSCOPY     a. 01/2015 EGD: patent esophagus.  . ESOPHAGOGASTRODUODENOSCOPY N/A 01/20/2015   Procedure: ESOPHAGOGASTRODUODENOSCOPY (EGD);  Surgeon: Carol Ada, MD;  Location: [facility] ENDOSCOPY;  Service: Endoscopy;  Laterality: N/A;  . HAND SURGERY    . JOINT REPLACEMENT     reports history bilateral TKA and right TSA  . LEFT HEART CATHETERIZATION WITH CORONARY ANGIOGRAM N/A 01/19/2015  . TEE WITHOUT CARDIOVERSION  05/04/2020  . TEE WITHOUT CARDIOVERSION N/A 05/05/2020   Procedure: TRANSESOPHAGEAL ECHOCARDIOGRAM (TEE);  Surgeon: Pixie Casino, MD;  Location: [facility] END[facility]COPY;  Service: Cardiovascular;  Laterality: N/A;  . TEE WITHOUT CARDIOVERSION N/A 07/01/2020   Procedure: TRANSESOPHAGEAL ECHOCARDIOGRAM (TEE);  Surgeon: Elouise Munroe, MD;  Location: [facility] ENDOSCOPY;  Service: Cardiovascular;  Laterality: N/A;  . TEE WITHOUT  CARDIOVERSION N/A 08/05/2020   Procedure: TRANSESOPHAGEAL ECHOCARDIOGRAM (TEE);  Surgeon: Geralynn Rile, MD;  Location: [place];  Service: Cardiovascular;  Laterality: N/A;  . THYROIDECTOMY     Family History  Problem Relation Age of Onset  . Heart disease Mother   . Hypertension Mother   . Cancer Father   . Alcoholism Father    Social History   Socioeconomic History  . Marital status: Widowed    Spouse name: Not on file  . Number of children: Not on file  . Years of education: Not on file  . Highest education level: Not on file  Occupational History  . Occupation: Retired  Tobacco Use  . Smoking status: Never Smoker  . Smokeless tobacco: Never Used  Vaping Use  . Vaping Use: Never used  Substance and Sexual Activity  . Alcohol use: No  . Drug use: Not on file  . Sexual activity: Not Currently  Other Topics Concern  . Not on file  Social History Narrative   Lives with daughter   Social Determinants of Health   Financial Resource Strain: Low Risk   . Difficulty of Paying Living Expenses: Not hard at all  Food Insecurity: No Food Insecurity  . Worried About Charity fundraiser in the Last Year: Never true  . Ran Out of Food in the Last Year: Never true  Transportation Needs: No Transportation Needs  . Lack of Transportation (Medical): No  . Lack of Transportation (Non-Medical): No  Physical Activity: Inactive  . Days of Exercise per Week: 0 days  . Minutes of Exercise per Session: 0 min  Stress: No Stress Concern Present  . Feeling of Stress : Not at all  Social Connections: Moderately Isolated  . Frequency of Communication with Friends and Family: More than three times a week  . Frequency of Social Gatherings with Friends and Family: Twice a week  . Attends Religious Services: More than 4 times per year  . Active Member of Clubs or Organizations: No  . Attends Archivist Meetings: Never  . Marital Status: Widowed    Tobacco  Counseling Counseling given: Not Answered   Clinical Intake:  Pre-visit preparation completed: Yes  Pain : No/denies pain     BMI - recorded: 40.31 Nutritional Status: BMI > 30  Obese Nutritional Risks: None Diabetes: Yes CBG done?: No (107) Did pt. bring in CBG monitor from home?: No  How often do you need to have someone help you when you read instructions, pamphlets, or other written materials from your doctor or pharmacy?: 1 -  Never  Diabetic?Nutrition Risk Assessment:  Has the patient had any N/V/D within the last 2 months?  No  Does the patient have any non-healing wounds?  No  Has the patient had any unintentional weight loss or weight gain?  No   Diabetes:  Is the patient diabetic?  Yes  If diabetic, was a CBG obtained today?  Yes  Did the patient bring in their glucometer from home?  No  How often do you monitor your CBG's? Daily.   Financial Strains and Diabetes Management:  Are you having any financial strains with the device, your supplies or your medication? No .  Does the patient want to be seen by Chronic Care Management for management of their diabetes?  No  Would the patient like to be referred to a Nutritionist or for Diabetic Management?  Yes pt stated she has an appt on may 9th for class at [address]   Diabetic Exams:  Diabetic Eye Exam: Overdue for diabetic eye exam. Pt has been advised about the importance in completing this exam. Patient advised to call and schedule an eye exam. Diabetic Foot Exam: Overdue, Pt has been advised about the importance in completing this exam. Pt is scheduled for diabetic foot exam on next appt.    Interpreter Needed?: No  Information entered by :: Charlott Rakes, LPN   Activities of Daily Living In your present state of health, do you have any difficulty performing the following activities: 02/10/2021 05/06/2020  Hearing? N -  Vision? N -  Difficulty concentrating or making decisions? N -  Walking or  climbing stairs? Y -  Comment just take my time and rest -  Dressing or bathing? N -  Doing errands, shopping? N Y  Conservation officer, nature and eating ? N -  Using the Toilet? N -  In the past six months, have you accidently leaked urine? N -  Do you have problems with loss of bowel control? N -  Managing your Medications? N -  Managing your Finances? N -  Housekeeping or managing your Housekeeping? N -  Some recent data might be hidden    Patient Care Team: [name], [name], MD as PCP - General (Family Medicine) O'Neal, Cassie Freer, MD as PCP - Cardiology (Internal Medicine)  Indicate any recent Medical Services you may have received from other than [facility] providers in the past year (date may be approximate).     Assessment:   This is a routine wellness examination for Regina [name].  Hearing/Vision screen  Hearing Screening   _0  _1  _2  _3  _4  _5  _6  _7  _8   Right ear:           Left ear:           Comments: Pt denies any hearing issues  Vision Screening Comments: Pt follows up with [facility] for annual eye exams   Dietary issues and exercise activities discussed: Current Exercise Habits: The patient does not participate in regular exercise at present  Goals    . Patient Stated     Wants to have help with nutrition to eat better      Depression Screen PHQ 2/9 Scores 02/10/2021 03/24/2020 09/09/2019  PHQ - 2 Score 0 1 0  PHQ- 9 Score - 7 -    Fall Risk Fall Risk  02/10/2021 09/09/2019  Falls in the past year? 1 0  Number falls in past yr: 1 0  Injury with Fall? 0 0  Risk for fall due to : Impaired mobility;Impaired  vision;Impaired balance/gait -  Risk for fall due to: Comment vertigo at times -  Follow up Falls prevention discussed Education provided    [place]:  Any stairs in or around the home? Yes  If so, are there any without handrails? No  Home free of loose throw rugs in walkways, pet beds, electrical cords,  etc? Yes  Adequate lighting in your home to reduce risk of falls? Yes   ASSISTIVE DEVICES UTILIZED TO PREVENT FALLS:  Life alert? No  Use of a cane, walker or w/c? Yes  Grab bars in the bathroom? No  Shower chair or bench in shower? No  Elevated toilet seat or a handicapped toilet? No   TIMED UP AND GO:  Was the test performed? No   Cognitive Function:     6CIT Screen 02/10/2021  What Year? 0 points  What month? 0 points  Count back from 20 0 points  Months in reverse 0 points  Repeat phrase 0 points    Immunizations Immunization History  Administered Date(s) Administered  . Fluad Quad(high Dose 65+) 06/21/2019  . Influenza,inj,Quad PF,6+ Mos 01/17/2015  . Influenza-Unspecified 01/17/2015, 10/25/2016  . PFIZER(Purple Top)SARS-COV-2 Vaccination 11/06/2019, 11/27/2019  . Pneumococcal Polysaccharide-23 05/01/2020    TDAP status: Due, Education has been provided regarding the importance of this vaccine. Advised may receive this vaccine at local pharmacy or Health Dept. Aware to provide a copy of the vaccination record if obtained from local pharmacy or Health Dept. Verbalized acceptance and understanding.  Flu Vaccine status: Declined, Education has been provided regarding the importance of this vaccine but patient still declined. Advised may receive this vaccine at local pharmacy or Health Dept. Aware to provide a copy of the vaccination record if obtained from local pharmacy or Health Dept. Verbalized acceptance and understanding.  Pneumococcal vaccine status: Up to date  Covid-19 vaccine status: Completed vaccines  Qualifies for Shingles Vaccine? Yes   Zostavax completed No   Shingrix Completed?: No.    Education has been provided regarding the importance of this vaccine. Patient has been advised to call insurance company to determine out of pocket expense if they have not yet received this vaccine. Advised may also receive vaccine at local pharmacy or Health Dept.  Verbalized acceptance and understanding.  Screening Tests Health Maintenance  Topic Date Due  . URINE MICROALBUMIN  05/21/2020  . COVID-19 Vaccine (3 - Booster for Pfizer series) 05/26/2020  . OPHTHALMOLOGY EXAM  06/17/2020  . FOOT EXAM  09/08/2020  . INFLUENZA VACCINE  05/17/2021  . HEMOGLOBIN A1C  07/11/2021  . DEXA SCAN  Completed  . Hepatitis C Screening  Completed  . HPV VACCINES  Aged Out  . TETANUS/TDAP  Discontinued  . PNA vac Low Risk Adult  Discontinued    Health Maintenance  Health Maintenance Due  Topic Date Due  . URINE MICROALBUMIN  05/21/2020  . COVID-19 Vaccine (3 - Booster for Pfizer series) 05/26/2020  . OPHTHALMOLOGY EXAM  06/17/2020  . FOOT EXAM  09/08/2020    Colorectal cancer screening: No longer required.   Mammogram status: Completed 08/27/19. Repeat every year  Bone Density status: Completed 10/17/16. Results reflect: Bone density results: NORMAL. Repeat every 0 years.  Additional Screening:  Hepatitis C Screening:  Completed 05/21/19  Vision Screening: Recommended annual ophthalmology exams for early detection of glaucoma and other disorders of the eye. Is the patient up to date with their annual eye exam?  Yes  Who is the provider or  what is the name of the office in which the patient attends annual eye exams? [facility] If pt is not established with a provider, would they like to be referred to a provider to establish care? No .   Dental Screening: Recommended annual dental exams for proper oral hygiene  Community Resource Referral / Chronic Care Management: CRR required this visit?  No   CCM required this visit?  No      Plan:     I have personally reviewed and noted the following in the patient's chart:   . Medical and social history . Use of alcohol, tobacco or illicit drugs  . Current medications and supplements . Functional ability and status . Nutritional status . Physical activity . Advanced directives . List of other  physicians . Hospitalizations, surgeries, and ER visits in previous 12 months . Vitals . Screenings to include cognitive, depression, and falls . Referrals and appointments  In addition, I have reviewed and discussed with patient certain preventive protocols, quality metrics, and best practice recommendations. A written personalized care plan for preventive services as well as general preventive health recommendations were provided to patient.     Willette Brace, LPN   [id number]/74/[id number]   Nurse Notes: None

## 2021-02-10 NOTE — Patient Instructions (Addendum)
Regina [name] , Thank you for taking time to come for your Medicare Wellness Visit. I appreciate your ongoing commitment to your health goals. Please review the following plan we discussed and let me know if I can assist you in the future.   Screening recommendations/referrals: Colonoscopy: No longer Required  Mammogram: Done 08/27/19 Bone Density: Done 10/17/16 Recommended yearly ophthalmology/optometry visit for glaucoma screening and checkup Recommended yearly dental visit for hygiene and checkup  Vaccinations: Influenza vaccine: Up to date Pneumococcal vaccine: Up to date Shingles vaccine: Shingrix discussed. Please contact your pharmacy for coverage information.    Covid-19:Completed 1/20 & 11/27/19  Advanced directives: Please bring a copy of your health care power of attorney and living will to the office at your convenience.  Conditions/risks identified: Get with nutritionist for diet management  Next appointment: Follow up in one year for your annual wellness visit    Preventive Care 10 Years and Older, Female Preventive care refers to lifestyle choices and visits with your health care provider that can promote health and wellness. What does preventive care include?  A yearly physical exam. This is also called an annual well check.  Dental exams once or twice a year.  Routine eye exams. Ask your health care provider how often you should have your eyes checked.  Personal lifestyle choices, including:  Daily care of your teeth and gums.  Regular physical activity.  Eating a healthy diet.  Avoiding tobacco and drug use.  Limiting alcohol use.  Practicing safe sex.  Taking low-dose aspirin every day.  Taking vitamin and mineral supplements as recommended by your health care provider. What happens during an annual well check? The services and screenings done by your health care provider during your annual well check will depend on your age, overall health,  lifestyle risk factors, and family history of disease. Counseling  Your health care provider may ask you questions about your:  Alcohol use.  Tobacco use.  Drug use.  Emotional well-being.  Home and relationship well-being.  Sexual activity.  Eating habits.  History of falls.  Memory and ability to understand (cognition).  Work and work Statistician.  Reproductive health. Screening  You may have the following tests or measurements:  Height, weight, and BMI.  Blood pressure.  Lipid and cholesterol levels. These may be checked every 5 years, or more frequently if you are over 10 years old.  Skin check.  Lung cancer screening. You may have this screening every year starting at age 65 if you have a 30-pack-year history of smoking and currently smoke or have quit within the past 15 years.  Fecal occult blood test (FOBT) of the stool. You may have this test every year starting at age 49.  Flexible sigmoidoscopy or colonoscopy. You may have a sigmoidoscopy every 5 years or a colonoscopy every 10 years starting at age 32.  Hepatitis C blood test.  Hepatitis B blood test.  Sexually transmitted disease (STD) testing.  Diabetes screening. This is done by checking your blood sugar (glucose) after you have not eaten for a while (fasting). You may have this done every 1-3 years.  Bone density scan. This is done to screen for osteoporosis. You may have this done starting at age 67.  Mammogram. This may be done every 1-2 years. Talk to your health care provider about how often you should have regular mammograms. Talk with your health care provider about your test results, treatment options, and if necessary, the need for more tests. Vaccines  Your health care provider may recommend certain vaccines, such as:  Influenza vaccine. This is recommended every year.  Tetanus, diphtheria, and acellular pertussis (Tdap, Td) vaccine. You may need a Td booster every 10 years.  Zoster  vaccine. You may need this after age 66.  Pneumococcal 13-valent conjugate (PCV13) vaccine. One dose is recommended after age 21.  Pneumococcal polysaccharide (PPSV23) vaccine. One dose is recommended after age 70. Talk to your health care provider about which screenings and vaccines you need and how often you need them. This information is not intended to replace advice given to you by your health care provider. Make sure you discuss any questions you have with your health care provider. Document Released: 10/30/2015 Document Revised: 06/22/2016 Document Reviewed: 08/04/2015 Elsevier Interactive Patient Education  2017 [employer] Prevention in the Home Falls can cause injuries. They can happen to people of all ages. There are many things you can do to make your home safe and to help prevent falls. What can I do on the outside of my home?  Regularly fix the edges of walkways and driveways and fix any cracks.  Remove anything that might make you trip as you walk through a door, such as a raised step or threshold.  Trim any bushes or trees on the path to your home.  Use bright outdoor lighting.  Clear any walking paths of anything that might make someone trip, such as rocks or tools.  Regularly check to see if handrails are loose or broken. Make sure that both sides of any steps have handrails.  Any raised decks and porches should have guardrails on the edges.  Have any leaves, snow, or ice cleared regularly.  Use sand or salt on walking paths during winter.  Clean up any spills in your garage right away. This includes oil or grease spills. What can I do in the bathroom?  Use night lights.  Install grab bars by the toilet and in the tub and shower. Do not use towel bars as grab bars.  Use non-skid mats or decals in the tub or shower.  If you need to sit down in the shower, use a plastic, non-slip stool.  Keep the floor dry. Clean up any water that spills on the  floor as soon as it happens.  Remove soap buildup in the tub or shower regularly.  Attach bath mats securely with double-sided non-slip rug tape.  Do not have throw rugs and other things on the floor that can make you trip. What can I do in the bedroom?  Use night lights.  Make sure that you have a light by your bed that is easy to reach.  Do not use any sheets or blankets that are too big for your bed. They should not hang down onto the floor.  Have a firm chair that has side arms. You can use this for support while you get dressed.  Do not have throw rugs and other things on the floor that can make you trip. What can I do in the kitchen?  Clean up any spills right away.  Avoid walking on wet floors.  Keep items that you use a lot in easy-to-reach places.  If you need to reach something above you, use a strong step stool that has a grab bar.  Keep electrical cords out of the way.  Do not use floor polish or wax that makes floors slippery. If you must use wax, use non-skid floor wax.  Do  not have throw rugs and other things on the floor that can make you trip. What can I do with my stairs?  Do not leave any items on the stairs.  Make sure that there are handrails on both sides of the stairs and use them. Fix handrails that are broken or loose. Make sure that handrails are as long as the stairways.  Check any carpeting to make sure that it is firmly attached to the stairs. Fix any carpet that is loose or worn.  Avoid having throw rugs at the top or bottom of the stairs. If you do have throw rugs, attach them to the floor with carpet tape.  Make sure that you have a light switch at the top of the stairs and the bottom of the stairs. If you do not have them, ask someone to add them for you. What else can I do to help prevent falls?  Wear shoes that:  Do not have high heels.  Have rubber bottoms.  Are comfortable and fit you well.  Are closed at the toe. Do not wear  sandals.  If you use a stepladder:  Make sure that it is fully opened. Do not climb a closed stepladder.  Make sure that both sides of the stepladder are locked into place.  Ask someone to hold it for you, if possible.  Clearly mark and make sure that you can see:  Any grab bars or handrails.  First and last steps.  Where the edge of each step is.  Use tools that help you move around (mobility aids) if they are needed. These include:  Canes.  Walkers.  Scooters.  Crutches.  Turn on the lights when you go into a dark area. Replace any light bulbs as soon as they burn out.  Set up your furniture so you have a clear path. Avoid moving your furniture around.  If any of your floors are uneven, fix them.  If there are any pets around you, be aware of where they are.  Review your medicines with your doctor. Some medicines can make you feel dizzy. This can increase your chance of falling. Ask your doctor what other things that you can do to help prevent falls. This information is not intended to replace advice given to you by your health care provider. Make sure you discuss any questions you have with your health care provider. Document Released: 07/30/2009 Document Revised: 03/10/2016 Document Reviewed: 11/07/2014 Elsevier Interactive Patient Education  2017 [employer].

## 2021-02-15 ENCOUNTER — Encounter: Payer: Self-pay | Admitting: Family Medicine

## 2021-02-15 ENCOUNTER — Other Ambulatory Visit: Payer: Self-pay

## 2021-02-15 ENCOUNTER — Ambulatory Visit (INDEPENDENT_AMBULATORY_CARE_PROVIDER_SITE_OTHER): Payer: Medicare Other | Admitting: Family Medicine

## 2021-02-15 ENCOUNTER — Ambulatory Visit: Payer: Medicare Other | Admitting: Cardiovascular Disease

## 2021-02-15 VITALS — BP 128/76 | HR 87 | Temp 98.7°F | Resp 16 | Ht 62.0 in | Wt 204.0 lb

## 2021-02-15 DIAGNOSIS — I10 Essential (primary) hypertension: Secondary | ICD-10-CM

## 2021-02-15 DIAGNOSIS — R252 Cramp and spasm: Secondary | ICD-10-CM | POA: Diagnosis not present

## 2021-02-15 DIAGNOSIS — N1832 Chronic kidney disease, stage 3b: Secondary | ICD-10-CM | POA: Diagnosis not present

## 2021-02-15 LAB — BASIC METABOLIC PANEL
BUN: 57 mg/dL — ABNORMAL HIGH (ref 6–23)
CO2: 34 mEq/L — ABNORMAL HIGH (ref 19–32)
Calcium: 9.2 mg/dL (ref 8.4–10.5)
Chloride: 96 mEq/L (ref 96–112)
Creatinine, Ser: 2.24 mg/dL — ABNORMAL HIGH (ref 0.40–1.20)
GFR: 20.67 mL/min — ABNORMAL LOW (ref >60.00)
Glucose, Bld: 106 mg/dL — ABNORMAL HIGH (ref 70–99)
Potassium: 4.4 mEq/L (ref 3.5–5.1)
Sodium: 140 mEq/L (ref 135–145)

## 2021-02-15 LAB — MAGNESIUM: Magnesium: 2.2 mg/dL (ref 1.5–2.5)

## 2021-02-15 MED ORDER — TIZANIDINE HCL 4 MG PO TABS: 2.0000 mg | ORAL_TABLET | Freq: Every evening | ORAL | 0 refills | Status: DC | PRN

## 2021-02-15 NOTE — Progress Notes (Signed)
Chief Complaint  Patient presents with  . Follow-up    potassium   HPI: Regina [name] is a 80 y.o. female with hx of hypothyroidism,cardiac amyloidosis,DM II,HTN,CVA,CKD III,and atrial fib on chronic anticoagulationwho is here today with her daughter complaining of LE cramps, usually when she is in bed at night. Problem has been going on for several months, before her last visit (09/30/20).  It does not happen every night. Episodes are "very painful." She has not identified exacerbating or alleviating factors. She wonders if problem is being caused by low K+. She has not tried OTC medications. Local massage has helped. Negative for worsening edema No tingling,numbness,or erythema.  She is on Mg Oxide 400 mg daily. In the past K+ has been low. She is not on K+ supplementation.  She is on Torsemide 20 mg bid. HTN on Metoprolol tartrate 100 mg bid. Negative for CP,SOB,orthopena,PND,or worsening edema.  CKD III: Follow with nephrologist. Negative for foam in urine,decreased urine output,or gross hematuria.  Lab Results  Component Value Date   CREATININE 1.87 (H) 08/03/2020   BUN 52 (H) 08/03/2020   NA 140 08/03/2020   K 4.3 08/03/2020   CL 95 (L) 08/03/2020   CO2 33 (H) 08/03/2020    Review of Systems  Constitutional: Positive for fatigue. Negative for activity change, appetite change and fever.  HENT: Negative for mouth sores, nosebleeds and sore throat.   Respiratory: Negative for cough and wheezing.   Gastrointestinal: Negative for abdominal pain, nausea and vomiting.       Negative for changes in bowel habits.  Neurological: Negative for syncope, weakness and headaches.  Psychiatric/Behavioral: Positive for sleep disturbance. Negative for confusion.  Rest see pertinent positives and negatives per HPI.  Current Outpatient Medications on File Prior to Visit  Medication Sig Dispense Refill  . Alcohol Swabs (B-D SINGLE USE SWABS REGULAR) PADS Use to test  blood sugar up to 3 times daily 100 each 3  . allopurinol (ZYLOPRIM) 100 MG tablet Take 1 tablet by mouth once daily 90 tablet 3  . Ascorbic Acid (VITAMIN C) 1000 MG tablet Take 1,000 mg by mouth daily.    Marland Kitchen atorvastatin (LIPITOR) 20 MG tablet Take 1 tablet (20 mg total) by mouth daily. 90 tablet 2  . Blood Glucose Monitoring Suppl (ACCU-CHEK GUIDE) w/Device KIT 1 Device by Does not apply route 3 (three) times daily. 1 kit 0  . cetirizine (ZYRTEC) 10 MG tablet Take 10 mg by mouth daily.    . Cholecalciferol (VITAMIN D3) 50 MCG (2000 UT) capsule Take 2,000 Units by mouth daily.    . clotrimazole-betamethasone (LOTRISONE) cream Apply 1 application topically 2 (two) times daily as needed. 30 g 1  . Continuous Blood Gluc Sensor (DEXCOM G6 SENSOR) MISC 1 Device by Does not apply route as directed. 9 each 3  . Continuous Blood Gluc Transmit (DEXCOM G6 TRANSMITTER) MISC 1 Device by Does not apply route as directed. (Patient not taking: Reported on 02/10/2021) 1 each 3  . diclofenac Sodium (VOLTAREN) 1 % GEL Apply 1 application topically daily as needed (arthritis pain/hands).    . famotidine (PEPCID) 20 MG tablet Take 1 tablet (20 mg total) by mouth at bedtime. 90 tablet 2  . FARXIGA 10 MG TABS tablet Take 10 mg by mouth daily.    . fluticasone (FLONASE) 50 MCG/ACT nasal spray Place 1 spray into both nostrils daily.    Marland Kitchen gabapentin (NEURONTIN) 600 MG tablet Take 1 tablet by mouth twice daily  60 tablet 3  . glucose blood (ACCU-CHEK GUIDE) test strip Use to test blood sugar up to 3 times daily 100 each 3  . insulin glargine, 1 Unit Dial, (TOUJEO SOLOSTAR) 300 UNIT/ML Solostar Pen Inject 26 Units into the skin daily. Eat a snack with protein nightly before bedtime. 15 mL 6  . insulin lispro (HUMALOG KWIKPEN) 100 UNIT/ML KwikPen Max daily 15 units 15 mL 11  . Insulin Pen Needle 32G X 4 MM MISC 1 Device by Does not apply route in the morning, at noon, in the evening, and at bedtime. 150 each 6  . lactulose  (CHRONULAC) 10 GM/15ML solution Take 20 g by mouth daily as needed for mild constipation.     . Lancets Misc. (ACCU-CHEK FASTCLIX LANCET) KIT Use to test blood sugar up to 3 times daily 1 kit 1  . levothyroxine (SYNTHROID) 150 MCG tablet Take 1 tablet (150 mcg total) by mouth daily before breakfast. 90 tablet 2  . magnesium oxide (MAG-OX) 400 (241.3 Mg) MG tablet Take 1 tablet by mouth once daily 90 tablet 3  . meclizine (ANTIVERT) 25 MG tablet Take 25 mg by mouth daily as needed for dizziness.     . metoprolol tartrate (LOPRESSOR) 100 MG tablet Take 0.5 tablets (50 mg total) by mouth 2 (two) times daily. 60 tablet 3  . nitroGLYCERIN (NITROSTAT) 0.4 MG SL tablet Place 1 tablet (0.4 mg total) under the tongue every 5 (five) minutes as needed for chest pain. 25 tablet 2  . Omega-3 Fatty Acids (FISH OIL) 1000 MG CAPS Take 2,000 mg by mouth daily.     Marland Kitchen Propylene Glycol (SYSTANE BALANCE OP) Place 1 drop into both eyes daily as needed (for dry eyes).    . Tafamidis 61 MG CAPS Take 61 mg by mouth daily. 30 capsule 6  . torsemide (DEMADEX) 20 MG tablet Take 2 tablets ( 40 mg ) daily 180 tablet 3  . warfarin (COUMADIN) 5 MG tablet Take 1 tablet (5 mg total) by mouth daily. (Patient taking differently: Take 2.5 mg by mouth See admin instructions. Take 2.5 mg Mon., Wed., Fri. Take 5 mg all the other days at bedtime) 90 tablet 3   No current facility-administered medications on file prior to visit.     Past Medical History:  Diagnosis Date  . Back pain   . 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 Chronic combined systolic and diastolic CHF (congestive heart failure) ([place])   . Diabetes mellitus without complication ([place])   . Dyspnea   . GERD (gastroesophageal reflux disease)   . H/O echocardiogram    a. 01/2015 Echo: EF 55-60%, Gr 2 DD, mod LVH, mildly dil LA.  Marland Kitchen Heart disease   . Hypertension   . Hypothyroidism   . Joint pain   . Kidney problem    . Lower extremity edema   . Mini stroke ([place])   . Non-obstructive CAD    a. 01/2015 Cardiolite: + inf wall ischemia, EF 56%;  b. 01/2015 Cath: LM nl, LAD 66m LCX min irregs, RCA dominant, 50-667m . Sleep apnea   . Swallowing difficulty   . Thyroid disease    Allergies  Allergen Reactions  . Ace Inhibitors Other (See Comments)    unknown  . Amlodipine Other (See Comments)    unknown  . Atenolol Other (See Comments)    bradycardia  . Avandia [Rosiglitazone] Other (See Comments)    unknown  .  Darvon [Propoxyphene] Other (See Comments)    unknown  . Erythromycin Itching  . Hydralazine Other (See Comments)    Burning in throat and chest  . Hydrocodone Other (See Comments)    Hallucinations.  . Levofloxacin Itching  . Morphine And Related Other (See Comments)    Dizzy and hallucianation, vomiting; Willing to try low dose  . Percocet [Oxycodone-Acetaminophen] Other (See Comments)    hallucination  . Spironolactone Other (See Comments)    unknown  . Tramadol Other (See Comments)    Unknown/does not recall reaction but does not want to take again    Social History   Socioeconomic History  . Marital status: Widowed    Spouse name: Not on file  . Number of children: Not on file  . Years of education: Not on file  . Highest education level: Not on file  Occupational History  . Occupation: Retired  Tobacco Use  . Smoking status: Never Smoker  . Smokeless tobacco: Never Used  Vaping Use  . Vaping Use: Never used  Substance and Sexual Activity  . Alcohol use: No  . Drug use: Not on file  . Sexual activity: Not Currently  Other Topics Concern  . Not on file  Social History Narrative   Lives with daughter   Social Determinants of Health   Financial Resource Strain: Low Risk   . Difficulty of Paying Living Expenses: Not hard at all  Food Insecurity: No Food Insecurity  . Worried About Charity fundraiser in the Last Year: Never true  . Ran Out of Food in the Last  Year: Never true  Transportation Needs: No Transportation Needs  . Lack of Transportation (Medical): No  . Lack of Transportation (Non-Medical): No  Physical Activity: Inactive  . Days of Exercise per Week: 0 days  . Minutes of Exercise per Session: 0 min  Stress: No Stress Concern Present  . Feeling of Stress : Not at all  Social Connections: Moderately Isolated  . Frequency of Communication with Friends and Family: More than three times a week  . Frequency of Social Gatherings with Friends and Family: Twice a week  . Attends Religious Services: More than 4 times per year  . Active Member of Clubs or Organizations: No  . Attends Archivist Meetings: Never  . Marital Status: Widowed    Vitals:   02/15/21 1052  BP: 128/76  Pulse: 87  Resp: 16  Temp: 98.7 F (37.1 C)  SpO2: 95%   Body mass index is 37.31 kg/m.  Physical Exam Vitals and nursing note reviewed.  Constitutional:      General: She is not in acute distress.    Appearance: She is well-developed.  HENT:     Head: Normocephalic and atraumatic.     Mouth/Throat:     Mouth: Mucous membranes are moist.  Eyes:     Conjunctiva/sclera: Conjunctivae normal.  Cardiovascular:     Rate and Rhythm: Normal rate. Rhythm irregular.     Pulses:          Dorsalis pedis pulses are 2+ on the right side and 2+ on the left side.     Heart sounds: No murmur heard.   Pulmonary:     Effort: Pulmonary effort is normal. No respiratory distress.     Breath sounds: Normal breath sounds.  Abdominal:     Palpations: Abdomen is soft. There is no hepatomegaly or mass.     Tenderness: There is no abdominal tenderness.  Musculoskeletal:  Right lower leg: No tenderness.     Left lower leg: No tenderness.  Lymphadenopathy:     Cervical: No cervical adenopathy.  Skin:    General: Skin is warm.     Findings: No erythema or rash.  Neurological:     Mental Status: She is alert and oriented to person, place, and time.      Cranial Nerves: No cranial nerve deficit.     Gait: Gait normal.  Psychiatric:     Comments: Well groomed, good eye contact.   ASSESSMENT AND PLAN:  [name] was seen today for follow-up.  Diagnoses and all orders for this visit: Orders Placed This Encounter  Procedures  . Basic metabolic panel  . Magnesium   Lab Results  Component Value Date   CREATININE 2.24 (H) 02/15/2021   BUN 57 (H) 02/15/2021   NA 140 02/15/2021   K 4.4 02/15/2021   CL 96 02/15/2021   CO2 34 (H) 02/15/2021   Cramps of lower extremity Chronic. We discussed possible etiologies. Some of her chronic medical problems and medications can be contributing factors. ? Electrolyte abnormalities After discussion of some side effects she agrees with trying Zanaflex 2-4 mg, which she can take every night x 2 weeks. Topical icy hot and stretching exercises may also help.  -     tiZANidine (ZANAFLEX) 4 MG tablet; Take 0.5-1 tablets (2-4 mg total) by mouth at bedtime as needed for muscle spasms.  Essential hypertension BP adequately controlled. Continue Metoprolol tartrate 100 mg bid.  Stage 3b chronic kidney disease (HCC) Cardiorenal synd and diabetes. Last Cr 1.9 and e GFR 26 in 12/2020. Following with nephrologist.   Spent 37 minutes.  During this time history was obtained and documented, examination was performed, prior labs reviewed, and assessment/plan discussed.  Return if symptoms worsen or fail to improve.   [name]. [name], MD  [facility]. [place] office.   A few things to remember from today's visit:   Cramps of lower extremity - Plan: Basic metabolic panel, Magnesium  Essential hypertension  Stage 3b chronic kidney disease ([place])  If you need refills please call your pharmacy. Do not use My Chart to request refills or for acute issues that need immediate attention.   Take Zanaflex nightly for 2 weeks and monitor for changes in cramps.  Leg Cramps Leg cramps occur when  one or more muscles tighten and a person has no control over it (involuntary muscle contraction). Muscle cramps are most common in the calf muscles of the leg. They can occur during exercise or at rest. Leg cramps are painful, and they may last for a few seconds to a few minutes. Cramps may return several times before they finally stop. Usually, leg cramps are not caused by a serious medical problem. In many cases, the cause is not known. Some common causes include:  Excessive physical effort (overexertion), such as during intense exercise.  Doing the same motion over and over.  Staying in a certain position for a long period of time.  Improper preparation, form, or technique while doing a sport or an activity.  Dehydration.  Injury.  Side effects of certain medicines.  Abnormally low levels of minerals in your blood (electrolytes), especially potassium and calcium. This could result from: ? Pregnancy. ? Taking diuretic medicines. Follow these instructions at home: Eating and drinking  Drink enough fluid to keep your urine pale yellow. Staying hydrated may help prevent cramps.  Eat a healthy diet that includes plenty of  nutrients to help your muscles function. A healthy diet includes fruits and vegetables, lean protein, whole grains, and low-fat or nonfat dairy products. Managing pain, stiffness, and swelling  Try massaging, stretching, and relaxing the affected muscle. Do this for several minutes at a time.  If directed, put ice on areas that are sore or painful after a cramp. To do this: ? Put ice in a plastic bag. ? Place a towel between your skin and the bag. ? Leave the ice on for 20 minutes, 2-3 times a day. ? Remove the ice if your skin turns bright red. This is very important. If you cannot feel pain, heat, or cold, you have a greater risk of damage to the area.  If directed, apply heat to muscles that are tense or tight. Do this before you exercise, or as often as told by  your health care provider. Use the heat source that your health care provider recommends, such as a moist heat pack or a heating pad. To do this: ? Place a towel between your skin and the heat source. ? Leave the heat on for 20-30 minutes. ? Remove the heat if your skin turns bright red. This is especially important if you are unable to feel pain, heat, or cold. You may have a greater risk of getting burned.  Try taking hot showers or baths to help relax tight muscles.      General instructions  If you are having frequent leg cramps, avoid intense exercise for several days.  Take over-the-counter and prescription medicines only as told by your health care provider.  Keep all follow-up visits. This is important. Contact a health care provider if:  Your leg cramps get more severe or more frequent, or they do not improve over time.  Your foot becomes cold, numb, or blue. Summary  Muscle cramps can develop in any muscle, but the most common place is in the calf muscles of the leg.  Leg cramps are painful, and they may last for a few seconds to a few minutes.  Usually, leg cramps are not caused by a serious medical problem. Often, the cause is not known.  Stay hydrated, and take over-the-counter and prescription medicines only as told by your health care provider. This information is not intended to replace advice given to you by your health care provider. Make sure you discuss any questions you have with your health care provider. Document Revised: 02/19/2020 Document Reviewed: 02/19/2020 Elsevier Patient Education  [place].   Please be sure medication list is accurate. If a new problem present, please set up appointment sooner than planned today.

## 2021-02-15 NOTE — Patient Instructions (Addendum)
A few things to remember from today's visit:   Cramps of lower extremity - Plan: Basic metabolic panel, Magnesium  Essential hypertension  Stage 3b chronic kidney disease ([place])  If you need refills please call your pharmacy. Do not use My Chart to request refills or for acute issues that need immediate attention.   Take Zanaflex nightly for 2 weeks and monitor for changes in cramps.  Leg Cramps Leg cramps occur when one or more muscles tighten and a person has no control over it (involuntary muscle contraction). Muscle cramps are most common in the calf muscles of the leg. They can occur during exercise or at rest. Leg cramps are painful, and they may last for a few seconds to a few minutes. Cramps may return several times before they finally stop. Usually, leg cramps are not caused by a serious medical problem. In many cases, the cause is not known. Some common causes include:  Excessive physical effort (overexertion), such as during intense exercise.  Doing the same motion over and over.  Staying in a certain position for a long period of time.  Improper preparation, form, or technique while doing a sport or an activity.  Dehydration.  Injury.  Side effects of certain medicines.  Abnormally low levels of minerals in your blood (electrolytes), especially potassium and calcium. This could result from: ? Pregnancy. ? Taking diuretic medicines. Follow these instructions at home: Eating and drinking  Drink enough fluid to keep your urine pale yellow. Staying hydrated may help prevent cramps.  Eat a healthy diet that includes plenty of nutrients to help your muscles function. A healthy diet includes fruits and vegetables, lean protein, whole grains, and low-fat or nonfat dairy products. Managing pain, stiffness, and swelling  Try massaging, stretching, and relaxing the affected muscle. Do this for several minutes at a time.  If directed, put ice on areas that are sore or  painful after a cramp. To do this: ? Put ice in a plastic bag. ? Place a towel between your skin and the bag. ? Leave the ice on for 20 minutes, 2-3 times a day. ? Remove the ice if your skin turns bright red. This is very important. If you cannot feel pain, heat, or cold, you have a greater risk of damage to the area.  If directed, apply heat to muscles that are tense or tight. Do this before you exercise, or as often as told by your health care provider. Use the heat source that your health care provider recommends, such as a moist heat pack or a heating pad. To do this: ? Place a towel between your skin and the heat source. ? Leave the heat on for 20-30 minutes. ? Remove the heat if your skin turns bright red. This is especially important if you are unable to feel pain, heat, or cold. You may have a greater risk of getting burned.  Try taking hot showers or baths to help relax tight muscles.      General instructions  If you are having frequent leg cramps, avoid intense exercise for several days.  Take over-the-counter and prescription medicines only as told by your health care provider.  Keep all follow-up visits. This is important. Contact a health care provider if:  Your leg cramps get more severe or more frequent, or they do not improve over time.  Your foot becomes cold, numb, or blue. Summary  Muscle cramps can develop in any muscle, but the most common place is in the  calf muscles of the leg.  Leg cramps are painful, and they may last for a few seconds to a few minutes.  Usually, leg cramps are not caused by a serious medical problem. Often, the cause is not known.  Stay hydrated, and take over-the-counter and prescription medicines only as told by your health care provider. This information is not intended to replace advice given to you by your health care provider. Make sure you discuss any questions you have with your health care provider. Document Revised: 02/19/2020  Document Reviewed: 02/19/2020 Elsevier Patient Education  [place].   Please be sure medication list is accurate. If a new problem present, please set up appointment sooner than planned today.

## 2021-02-17 ENCOUNTER — Telehealth: Payer: Self-pay | Admitting: Family Medicine

## 2021-02-17 NOTE — Telephone Encounter (Signed)
Okay to place referral? Patient was here on Monday.

## 2021-02-17 NOTE — Telephone Encounter (Addendum)
Patient is calling and stated that she is experiencing some burning in her feet and wanted to see if provider can refer her to a specialist, please advise. CB is [phone]

## 2021-02-19 DIAGNOSIS — G4733 Obstructive sleep apnea (adult) (pediatric): Secondary | ICD-10-CM | POA: Diagnosis not present

## 2021-02-22 ENCOUNTER — Encounter: Payer: Self-pay | Admitting: Dietician

## 2021-02-22 ENCOUNTER — Encounter: Payer: Medicare Other | Attending: Internal Medicine | Admitting: Dietician

## 2021-02-22 ENCOUNTER — Other Ambulatory Visit: Payer: Self-pay

## 2021-02-22 DIAGNOSIS — E1159 Type 2 diabetes mellitus with other circulatory complications: Secondary | ICD-10-CM | POA: Diagnosis not present

## 2021-02-22 DIAGNOSIS — N184 Chronic kidney disease, stage 4 (severe): Secondary | ICD-10-CM | POA: Insufficient documentation

## 2021-02-22 DIAGNOSIS — Z794 Long term (current) use of insulin: Secondary | ICD-10-CM | POA: Diagnosis not present

## 2021-02-22 NOTE — Patient Instructions (Signed)
Continue to read labels. Continue to follow a low sodium diet. Avoid Salt substitutes such as no salt, Nu salt, and other seasonings with Potassium. Avoid foods with added Phos...  No need to restrict potassium at this time unless your blood potassium rises. Choose only small amounts of meat.  Reduce eggs to 1-2 in the am.  Choose meatless meals as desired. Monitor your blood glucose readings. Take your medication as prescribed

## 2021-02-22 NOTE — Progress Notes (Signed)
Diabetes Self-Management Education  Visit Type: First/Initial  Appt. Start Time: 0930 Appt. End Time: 9381  02/22/2021  [name], identified by name and date of birth, is a 80 y.o. female with a diagnosis of Diabetes: Type 2.   ASSESSMENT Patient is here today with her granddaughter Mia who is a Marine scientist. Mia is 90% plant based.  She does not know what to eat with multiple medical complications.    History includes Type 2 Diabetes, CKD stage 4, HTN, CVA, chronic anticoagulation, afib, neuropathy, retinopathy, OSA on C-pap, cardiac amloydosis A1C 7.4% 01/08/21 no change from 09/09/2020 eGFR 20, BUN 57, Creatinine 2.24, Potassium 4.4  (GFR has decreased from 26 12/2020 Medications include Farxiga, Toujeo 26 units q HS, Humalog (sliding scale for correction, coumadin DEXCOM G6  Weigh hx: 219 lbs today  Her cardiologist does not want her to lose more weight per granddaughter.  246 lbs 11/2019 and lost at [facility] 135 lbs lowest adult weight prior to children  Patient lives with her son.  She has increased family support who attend MD appointment with her and other things as needed. They shop together and patient does most of the cooking.  She avoids added salt.  She reads labels to determine the sodium content of food. Does not feel balanced enough to walk alone.  Height 5' 2"  (1.575 m), weight 219 lb (99.3 kg). Body mass index is 40.06 kg/m.   Diabetes Self-Management Education - 02/22/21 0954      Visit Information   Visit Type First/Initial      Initial Visit   Diabetes Type Type 2    Are you currently following a meal plan? No    Are you taking your medications as prescribed? Yes    Date Diagnosed before [facility]   How would you rate your overall health? Poor      Psychosocial Assessment   Patient Belief/Attitude about Diabetes Motivated to manage diabetes    Self-care barriers None    Self-management support Doctor's office     Other persons present Patient;Family Member    Patient Concerns Nutrition/Meal planning    Special Needs Instruct caregiver    Preferred Learning Style No preference indicated    Learning Readiness Ready    How often do you need to have someone help you when you read instructions, pamphlets, or other written materials from your doctor or pharmacy? 3 - Sometimes    What is the last grade level you completed in school? 2 years college      Pre-Education Assessment   Patient understands the diabetes disease and treatment process. Needs Instruction    Patient understands incorporating nutritional management into lifestyle. Needs Instruction    Patient undertands incorporating physical activity into lifestyle. Needs Instruction    Patient understands using medications safely. Needs Instruction    Patient understands monitoring blood glucose, interpreting and using results Needs Instruction    Patient understands prevention, detection, and treatment of acute complications. Needs Instruction    Patient understands prevention, detection, and treatment of chronic complications. Needs Instruction    Patient understands how to develop strategies to address psychosocial issues. Needs Instruction    Patient understands how to develop strategies to promote health/change behavior. Needs Instruction      Complications   Last HgB A1C per patient/outside source 7.4 %   01/08/2021   How often do you check your blood sugar? > 4 times/day  Fasting Blood glucose range (mg/dL) 70-129    Postprandial Blood glucose range (mg/dL) 180-200;>200    Number of hypoglycemic episodes per month 0    Number of hyperglycemic episodes per week 14    Can you tell when your blood sugar is high? Yes    What do you do if your blood sugar is high? takes humalog    Have you had a dilated eye exam in the past 12 months? Yes    Have you had a dental exam in the past 12 months? No    Are you checking your feet? Yes    How many  days per week are you checking your feet? 7      Dietary Intake   Breakfast 3 eggs, fruit (canned- no sugar added or apple or banana)   9 or 9:30   Snack (morning) fruit, unsalted nuts, skinny pop popcorn    Lunch generally skips    Dinner vegetables, baked meat (1 skinless chicken thigh or fish or beef neck bones), peas or legumes   6   Snack (evening) occasional peanut butter and graham crackers    Beverage(s) water, 2% milk (12oz), diet dr. Malachi Bonds      Exercise   Exercise Type ADL's      Patient Education   Previous Diabetes Education No    Nutrition management  Meal options for control of blood glucose level and chronic complications.;Other (comment)   renal diet, diet and coumadin   Physical activity and exercise  Role of exercise on diabetes management, blood pressure control and cardiac health.    Medications Reviewed patients medication for diabetes, action, purpose, timing of dose and side effects.    Monitoring Taught/discussed recording of test results and interpretation of SMBG.    Psychosocial adjustment Worked with patient to identify barriers to care and solutions      Individualized Goals (developed by patient)   Nutrition Other (comment)   renal, diabetic, consistent vitamin K   Physical Activity Not Applicable    Medications take my medication as prescribed    Monitoring  Other (comment)   continue dexcom   Reducing Risk examine blood glucose patterns;do foot checks daily;get labs drawn      Post-Education Assessment   Patient understands the diabetes disease and treatment process. Demonstrates understanding / competency    Patient understands incorporating nutritional management into lifestyle. Needs Review    Patient undertands incorporating physical activity into lifestyle. Demonstrates understanding / competency    Patient understands using medications safely. Demonstrates understanding / competency    Patient understands monitoring blood glucose, interpreting  and using results Demonstrates understanding / competency    Patient understands prevention, detection, and treatment of acute complications. Demonstrates understanding / competency    Patient understands prevention, detection, and treatment of chronic complications. Demonstrates understanding / competency    Patient understands how to develop strategies to address psychosocial issues. Demonstrates understanding / competency    Patient understands how to develop strategies to promote health/change behavior. Demonstrates understanding / competency      Outcomes   Expected Outcomes Demonstrated interest in learning. Expect positive outcomes    Future DMSE 2 months    Program Status Not Completed           Individualized Plan for Diabetes Self-Management Training:   Learning Objective:  Patient will have a greater understanding of diabetes self-management. Patient education plan is to attend individual and/or group sessions per assessed needs and concerns.   Plan:  Patient Instructions  Continue to read labels. Continue to follow a low sodium diet. Avoid Salt substitutes such as no salt, Nu salt, and other seasonings with Potassium. Avoid foods with added Phos...  No need to restrict potassium at this time unless your blood potassium rises. Choose only small amounts of meat.  Reduce eggs to 1-2 in the am.  Choose meatless meals as desired. Monitor your blood glucose readings. Take your medication as prescribed   Expected Outcomes:  Demonstrated interest in learning. Expect positive outcomes  Education material provided: Vitamin K and Medications from AND; NKD National Kidney Diet:  DISH UP a Kidney Friendly Meal for patient's with chronic kidney disease  If problems or questions, patient to contact team via:  Phone  Future DSME appointment: 2 months

## 2021-02-23 NOTE — Telephone Encounter (Signed)
She has hx of peripheral neuropathy. She is already seeing endocrinologist, she can address this with provider next visit. Thanks, BJ

## 2021-02-23 NOTE — Telephone Encounter (Signed)
I called and spoke with patient. We went over information below and she will contact her endocrinologist.

## 2021-02-26 ENCOUNTER — Telehealth: Payer: Self-pay | Admitting: Internal Medicine

## 2021-02-26 NOTE — Telephone Encounter (Signed)
Spoken patient. She wanted know what should she do like today she had a pouch of tuna and 4-5 pieces of Triscuit  Her blood sugar was 161 so she gave herself 1 unit then ate.   Patient  waited and check her blood sugar it was 220.  Patient wanted to know if her blood sugar readings is high after the second check, should she give herself an extra unit.   Patient did have her Dexcom on and checking every frequently.

## 2021-02-26 NOTE — Telephone Encounter (Signed)
Spoken to patient and notified Dr Shamleffer's comments. Verbalized understanding.   

## 2021-02-26 NOTE — Telephone Encounter (Signed)
Patient is calling to ask about High CBG & Using Sliding Scale.  She has a scale to use, but her question is how often she is safe to use the sliding scale.  Ex:  Her blood sugar was in 160's she used 1 unit of insulin as instructed, then tested her blood sugar again and it was above 220.  Does not know if she should bolus again.  Call back # [phone]

## 2021-02-26 NOTE — Telephone Encounter (Signed)
She should check her sugar just before eating and bolus then. She needs to separate her humalog injections by 4 hours.

## 2021-03-01 ENCOUNTER — Telehealth: Payer: Self-pay | Admitting: Family Medicine

## 2021-03-01 NOTE — Progress Notes (Signed)
  Chronic Care Management   Note  03/01/2021 Name: [name] MRN: [id number] DOB: Mar 14, 1941  Regina [name] is a 80 y.o. year old female who is a primary care patient of [name], Malka So, MD. I reached out to [name] by phone today in response to a referral sent by Ms. [name] PCP, [name], [name], MD.   Ms. Stay was given information about Chronic Care Management services today including:  1. CCM service includes personalized support from designated clinical staff supervised by her physician, including individualized plan of care and coordination with other care providers 2. 24/7 contact phone numbers for assistance for urgent and routine care needs. 3. Service will only be billed when office clinical staff spend 20 minutes or more in a month to coordinate care. 4. Only one practitioner may furnish and bill the service in a calendar month. 5. The patient may stop CCM services at any time (effective at the end of the month) by phone call to the office staff.   Patient agreed to services and verbal consent obtained.   Follow up plan:   Carley Perdue [employer] Scheduler

## 2021-03-02 ENCOUNTER — Telehealth: Payer: Self-pay | Admitting: Cardiovascular Disease

## 2021-03-02 NOTE — Telephone Encounter (Signed)
Spoke with patient and she would like some guidelines on what to do with her diuretics when wait goes up  She is feeling better today now that she is almost back at her baseline Takes Torsemide 20 mg 2 daily, took 2 extra yesterday  Last BMET 5/5  BUN 57 Cr 2.24 K+ 4.4  5-9 saw a nutritionist and was put on kidney friendly diet meal plan  Discussed weighing self daily and taking extra diuretics with weight gain of 3 lbs 24 hours 5 lbs in 7 days but will forward to Dr Audie Box for recommendations

## 2021-03-02 NOTE — Telephone Encounter (Signed)
Pt c/o swelling: STAT is pt has developed SOB within 24 hours  1) How much weight have you gained and in what time span? Not exactly sure   2) If swelling, where is the swelling located? Stomach   3) Are you currently taking a fluid pill? Yes, took 69 MG's worth of torsemide yesterday and has taken 40 MG's of torsemide this morning. Feels better today.    4) Are you currently SOB? No, had SOB when moving around yesterday   5) Do you have a log of your daily weights (if so, list)? Has it on her tablet unsure how to get information off tablet. Normally weighs around 215, but was 216 this morning and 220 lbs yesterday.   6) Have you gained 3 pounds in a day or 5 pounds in a week? Thinks so because she got up to 220 lbs yesterday   7) Have you traveled recently? Yes, traveled to church on Sunday which is an hour drive.    Requested guidance on how to handle this situation when it occurs. Had an episode of swelling in her stomach yesterday that made her feel miserable causing her to increase her torsemide to feel better. Also wants guidance on how to take her torsemide in these situations due to not wanting to overdue it with her kidney issues. Please advise.

## 2021-03-03 NOTE — Telephone Encounter (Signed)
I agree with Melinda's recommendation. Would take an extra dose if more than 3 pounds in 1 day or 5 pounds in 7 days. It will not affect her kidneys.  [name], MD, [place]  [address] [place] [employer], [place] [address] [phone]  6:44 AM

## 2021-03-03 NOTE — Telephone Encounter (Signed)
Advised patient, verbalized understanding  

## 2021-03-04 ENCOUNTER — Telehealth: Payer: Self-pay | Admitting: Family Medicine

## 2021-03-04 NOTE — Telephone Encounter (Signed)
Patient is calling and is requesting a refill for lactulose [facility]) 10 GM/15ML solution to be sent to    [facility], [address] [place] [address]  Phone:  [phone] Fax:  [phone]

## 2021-03-08 NOTE — Telephone Encounter (Signed)
I spoke with patient. We went over information below and she verbalized understanding.

## 2021-03-08 NOTE — Telephone Encounter (Signed)
I do not recommend Lactulose for chronic use. If having constipation, she can try Miralax daily as needed. Bisacodyl 5 mg daily prn can be added if Miralax alone does not help. Adequate fiber intake and hydration. We can discuss prescription medications if OTC medications do not help. Thanks, BJ

## 2021-03-13 ENCOUNTER — Other Ambulatory Visit: Payer: Self-pay | Admitting: Cardiovascular Disease

## 2021-03-13 ENCOUNTER — Telehealth: Payer: Self-pay | Admitting: Internal Medicine

## 2021-03-13 NOTE — Telephone Encounter (Signed)
Cardiology Moonlighter Note  Returned page from patient. She is out of torsemide. Has been taking 2 tablets (40mg  total) daily. Today was her last dose. Feels as though she needs this dose daily to maintain euvolemia.   I called in a prescription for torsemide 40mg  daily to be sent to her [facility]. Patient has follow up appointment with cardiology in place on 6/3. Will cc Dr Audie Box on this note.   Marcie Mowers, MD Cardiology Fellow, PGY-8

## 2021-03-16 ENCOUNTER — Telehealth: Payer: Self-pay | Admitting: Cardiovascular Disease

## 2021-03-16 NOTE — Telephone Encounter (Signed)
Go ahead with referral. She may need to go to the ER.   [name], MD, [place]  [address], [place], [place] [address] [phone]  6:46 PM

## 2021-03-16 NOTE — Telephone Encounter (Signed)
PT is calling requesting a callback today.She states something is going on with her and wants to tell the nurse so she can see what to do about the issue

## 2021-03-16 NOTE — Telephone Encounter (Signed)
Called patient- she states that she is having sharp pains in her head. She states they come and go but they come on suddenly. She denies vision changes, no headache like symptoms, no stroke like symptoms, her BP has been good 101/70 HR 70 today. She does have a Garment/textile technologist but he is in [place] and that is far for her to drive and has not followed up with them. She is requesting a referral to Neurologist here to evaluate her. I did advise her to go ahead and call her Neurologist at [facility] to get advice from their standpoint. She verbalized understanding and will do this.  Do you want to order referral to Neuro?   Thanks!

## 2021-03-17 ENCOUNTER — Other Ambulatory Visit: Payer: Self-pay

## 2021-03-17 DIAGNOSIS — I639 Cerebral infarction, unspecified: Secondary | ICD-10-CM

## 2021-03-17 NOTE — Telephone Encounter (Signed)
Referral placed.

## 2021-03-19 ENCOUNTER — Ambulatory Visit (INDEPENDENT_AMBULATORY_CARE_PROVIDER_SITE_OTHER): Payer: Medicare Other

## 2021-03-19 ENCOUNTER — Other Ambulatory Visit: Payer: Self-pay

## 2021-03-19 DIAGNOSIS — Z7901 Long term (current) use of anticoagulants: Secondary | ICD-10-CM

## 2021-03-19 DIAGNOSIS — I4819 Other persistent atrial fibrillation: Secondary | ICD-10-CM

## 2021-03-19 LAB — POCT INR: INR: 2.5 (ref 2.0–3.0)

## 2021-03-19 NOTE — Patient Instructions (Addendum)
Continue taking 1/2 tablet daily except 1 tablet each Sunday.  Repeat INR in 5 weeks

## 2021-04-02 ENCOUNTER — Encounter (HOSPITAL_COMMUNITY): Payer: Self-pay | Admitting: Emergency Medicine

## 2021-04-02 ENCOUNTER — Telehealth: Payer: Self-pay | Admitting: Cardiovascular Disease

## 2021-04-02 ENCOUNTER — Other Ambulatory Visit: Payer: Self-pay

## 2021-04-02 ENCOUNTER — Emergency Department (HOSPITAL_COMMUNITY)
Admission: EM | Admit: 2021-04-02 | Discharge: 2021-04-02 | Disposition: A | Discharge status: Home / Self Care | Payer: Medicare Other | Attending: Emergency Medicine | Admitting: Emergency Medicine

## 2021-04-02 ENCOUNTER — Emergency Department (HOSPITAL_COMMUNITY): Payer: Medicare Other

## 2021-04-02 DIAGNOSIS — E1122 Type 2 diabetes mellitus with diabetic chronic kidney disease: Secondary | ICD-10-CM | POA: Insufficient documentation

## 2021-04-02 DIAGNOSIS — E1140 Type 2 diabetes mellitus with diabetic neuropathy, unspecified: Secondary | ICD-10-CM | POA: Diagnosis not present

## 2021-04-02 DIAGNOSIS — Z7901 Long term (current) use of anticoagulants: Secondary | ICD-10-CM | POA: Insufficient documentation

## 2021-04-02 DIAGNOSIS — I2510 Atherosclerotic heart disease of native coronary artery without angina pectoris: Secondary | ICD-10-CM | POA: Diagnosis not present

## 2021-04-02 DIAGNOSIS — Z955 Presence of coronary angioplasty implant and graft: Secondary | ICD-10-CM | POA: Insufficient documentation

## 2021-04-02 DIAGNOSIS — E11319 Type 2 diabetes mellitus with unspecified diabetic retinopathy without macular edema: Secondary | ICD-10-CM | POA: Diagnosis not present

## 2021-04-02 DIAGNOSIS — R0602 Shortness of breath: Secondary | ICD-10-CM | POA: Diagnosis present

## 2021-04-02 DIAGNOSIS — I4891 Unspecified atrial fibrillation: Secondary | ICD-10-CM | POA: Insufficient documentation

## 2021-04-02 DIAGNOSIS — I517 Cardiomegaly: Secondary | ICD-10-CM | POA: Diagnosis not present

## 2021-04-02 DIAGNOSIS — I130 Hypertensive heart and chronic kidney disease with heart failure and stage 1 through stage 4 chronic kidney disease, or unspecified chronic kidney disease: Secondary | ICD-10-CM | POA: Diagnosis not present

## 2021-04-02 DIAGNOSIS — Z794 Long term (current) use of insulin: Secondary | ICD-10-CM | POA: Insufficient documentation

## 2021-04-02 DIAGNOSIS — E039 Hypothyroidism, unspecified: Secondary | ICD-10-CM | POA: Insufficient documentation

## 2021-04-02 DIAGNOSIS — N1831 Chronic kidney disease, stage 3a: Secondary | ICD-10-CM | POA: Insufficient documentation

## 2021-04-02 DIAGNOSIS — E1142 Type 2 diabetes mellitus with diabetic polyneuropathy: Secondary | ICD-10-CM | POA: Insufficient documentation

## 2021-04-02 DIAGNOSIS — R079 Chest pain, unspecified: Secondary | ICD-10-CM | POA: Insufficient documentation

## 2021-04-02 DIAGNOSIS — I5042 Chronic combined systolic (congestive) and diastolic (congestive) heart failure: Secondary | ICD-10-CM | POA: Insufficient documentation

## 2021-04-02 DIAGNOSIS — E8770 Fluid overload, unspecified: Secondary | ICD-10-CM | POA: Insufficient documentation

## 2021-04-02 DIAGNOSIS — Z79899 Other long term (current) drug therapy: Secondary | ICD-10-CM | POA: Insufficient documentation

## 2021-04-02 DIAGNOSIS — I110 Hypertensive heart disease with heart failure: Secondary | ICD-10-CM | POA: Diagnosis not present

## 2021-04-02 DIAGNOSIS — I509 Heart failure, unspecified: Secondary | ICD-10-CM | POA: Diagnosis not present

## 2021-04-02 LAB — BASIC METABOLIC PANEL
Anion gap: 9 (ref 5–15)
BUN: 39 mg/dL — ABNORMAL HIGH (ref 8–23)
CO2: 29 mmol/L (ref 22–32)
Calcium: 9.1 mg/dL (ref 8.9–10.3)
Chloride: 100 mmol/L (ref 98–111)
Creatinine, Ser: 2.04 mg/dL — ABNORMAL HIGH (ref 0.44–1.00)
GFR, Estimated: 25 mL/min — ABNORMAL LOW (ref >60)
Glucose, Bld: 97 mg/dL (ref 70–99)
Potassium: 3.9 mmol/L (ref 3.5–5.1)
Sodium: 138 mmol/L (ref 135–145)

## 2021-04-02 LAB — CBC
HCT: 37.7 % (ref 36.0–46.0)
Hemoglobin: 12 g/dL (ref 12.0–15.0)
MCH: 29.3 pg (ref 26.0–34.0)
MCHC: 31.8 g/dL (ref 30.0–36.0)
MCV: 92 fL (ref 80.0–100.0)
Platelets: 249 10*3/uL (ref 150–400)
RBC: 4.1 MIL/uL (ref 3.87–5.11)
RDW: 14.9 % (ref 11.5–15.5)
WBC: 6.6 10*3/uL (ref 4.0–10.5)
nRBC: 0 % (ref 0.0–0.2)

## 2021-04-02 LAB — TROPONIN I (HIGH SENSITIVITY)
Troponin I (High Sensitivity): 61 ng/L — ABNORMAL HIGH (ref <18)
Troponin I (High Sensitivity): 63 ng/L — ABNORMAL HIGH (ref <18)

## 2021-04-02 LAB — PROTIME-INR
INR: 2.2 — ABNORMAL HIGH (ref 0.8–1.2)
Prothrombin Time: 24 seconds — ABNORMAL HIGH (ref 11.4–15.2)

## 2021-04-02 MED ORDER — TORSEMIDE 20 MG PO TABS: 40.0000 mg | ORAL_TABLET | Freq: Every day | ORAL | 0 refills | Status: DC

## 2021-04-02 MED ORDER — FUROSEMIDE 10 MG/ML IJ SOLN
40.0000 mg | Freq: Once | INTRAMUSCULAR | Status: AC
  Administered 2021-04-02: 40 mg via INTRAVENOUS
  Filled 2021-04-02: qty 4

## 2021-04-02 NOTE — Discharge Instructions (Addendum)
I want you to take THREE tablets of your torsemide daily for the next 3 days. Then, you can continue to take two daily and an additional tablet as needed as prescribed. I have sent a prescription for this medication to your pharmacy.  Tomorrow I want you to call your cardiologist office and let them know that you were in the emergency department.  I recommend scheduling an appointment within the next 1-2 weeks for follow-up and reevaluation to make sure that you are still improving.

## 2021-04-02 NOTE — ED Provider Notes (Signed)
[facility] EMERGENCY DEPARTMENT Provider Note   CSN: [id number] Arrival date & time: 04/02/21  1312     History No chief complaint on file.   Regina [name] is a 80 y.o. female.  The history is provided by the patient and medical records.  Chest Pain Pain location:  L chest Pain quality: crushing, pressure and radiating   Pain radiates to:  L arm, L jaw and L shoulder Pain severity:  Moderate Onset quality:  Sudden Duration:  2 hours Timing:  Constant Progression:  Resolved Chronicity:  Recurrent Context: not breathing, not drug use, not eating, not intercourse, not lifting, not movement, not raising an arm, not at rest, not stress and not trauma   Context comment:  Was cutting vegtables preparing for a meal Worsened by:  Nothing Ineffective treatments:  None tried Associated symptoms: shortness of breath (resolved)   Associated symptoms: no abdominal pain, no AICD problem, no altered mental status, no anorexia, no anxiety, no back pain, no claudication, no cough, no diaphoresis, no dizziness, no dysphagia, no fatigue, no fever, no headache, no heartburn, no lower extremity edema, no nausea, no near-syncope, no numbness, no orthopnea, no palpitations, no PND, no syncope, no vomiting and no weakness   Risk factors: coronary artery disease, diabetes mellitus, high cholesterol and hypertension   Risk factors comment:  CHF 2/2 cardiac amyloidosis     Past Medical History:  Diagnosis Date   Back pain    CHF (congestive heart failure) (HCC)    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 01/2015 Echo: EF 55-60%, Gr 2 DD, mod LVH, mildly dil LA.   Heart disease    Hypertension    Hypothyroidism    Joint pain    Kidney problem    Lower extremity  edema    Mini stroke ([place])    Non-obstructive CAD    a. 01/2015 Cardiolite: + inf wall ischemia, EF 56%;  b. 01/2015 Cath: LM nl, LAD 74m LCX min irregs, RCA dominant, 50-649m  Sleep apnea    Swallowing difficulty    Thyroid disease     Patient Active Problem List   Diagnosis Date Noted   Type 2 diabetes mellitus with stage 3a chronic kidney disease, with long-term current use of insulin (HCBrant Lake11/29/2021   Type 2 diabetes mellitus with retinopathy, with long-term current use of insulin (HCFreeland11/29/2021   Diabetes mellitus (HCHel[facility] [place]11/29/2021   Type 2 diabetes mellitus with diabetic polyneuropathy, with long-term current use of insulin (HCGeorgetown11/29/2021   Long term (current) use of anticoagulants 07/13/2020   Persistent atrial fibrillation (HCC)    Mixed hyperlipidemia    Chronic diastolic heart failure (HCChelsea   Demand ischemia (HCC)    Atrial fibrillation (HCEl [place]07/06/2020   Other fatigue 03/24/2020   Short of breath on exertion 03/24/2020   Congestive heart failure (HCCana06/05/2020   Vitamin D deficiency 03/24/2020   Sleep apnea 05/21/2019   History of hepatitis C 05/21/2019   Insomnia 05/21/2019   CKD (chronic kidney disease), stage III (HCLone Elm08/01/2019   GERD (gastroesophageal reflux disease) 04/20/2018   Type 2 diabetes mellitus with diabetic neuropathy, unspecified (HCFreedom Acres03/02/2017   History of TIA (transient ischemic attack) 12/19/2016   Hyperlipidemia associated with type 2  diabetes mellitus ([place]) 12/19/2016   S/P total knee replacement using cement, right 03/18/2015   Non-obstructive CAD    Cardiovascular stress test abnormal    Pleuritic chest pain 01/16/2015   Acute renal failure superimposed on stage 3 chronic kidney disease ([place]) 01/16/2015   Obesity, Class III, BMI 40-49.9 (morbid obesity) ([place]) 01/16/2015   Essential hypertension 01/16/2015   Hypothyroidism 01/16/2015   OSA on CPAP 01/16/2015   DM type 2 (diabetes mellitus, type 2) ([place]) 01/16/2015   Coronary  artery disease involving native coronary artery without angina pectoris 10/02/2014   Physical deconditioning 02/28/2013   Class 3 severe obesity with serious comorbidity and body mass index (BMI) of 40.0 to 44.9 in adult [facility]) 02/27/2013   Osteoarthritis 02/27/2013    Past Surgical History:  Procedure Laterality Date   ABDOMINAL HYSTERECTOMY     BUBBLE STUDY  07/01/2020   Procedure: BUBBLE STUDY;  Surgeon: Elouise Munroe, MD;  Location: [facility];  Service: Cardiovascular;;   BUBBLE STUDY  08/05/2020   Procedure: BUBBLE STUDY;  Surgeon: Geralynn Rile, MD;  Location: [facility];  Service: Cardiovascular;;  done with definity    CARDIAC CATHETERIZATION     ESOPHAGOGASTRODUODENOSCOPY     a. 01/2015 EGD: patent esophagus.   ESOPHAGOGASTRODUODENOSCOPY N/A 01/20/2015   Procedure: ESOPHAGOGASTRODUODENOSCOPY (EGD);  Surgeon: Carol Ada, MD;  Location[facility] ENDOSCOPY;  Service: Endoscopy;  Laterality: N/A;   HAND SURGERY     JOINT REPLACEMENT     reports history bilateral TKA and right TSA   LEFT HEART CATHETERIZATION WITH CORONARY ANGIOGRAM N/A 01/19/2015   TEE WITHOUT CARDIOVERSION  05/04/2020   TEE WITHOUT CARDIOVERSION N/A 05/05/2020   Procedure: TRANSESOPHAGEAL ECHOCARDIOGRAM (TEE);  Surgeon: Pixie Casino, MD;  Location: [facility] ENDOSCOPY;  Service: Cardiovascular;  Laterality: N/A;   TEE WITHOUT CARDIOVERSION N/A 07/01/2020   Procedure: TRANSESOPHAGEAL ECHOCARDIOGRAM (TEE);  Surgeon: Elouise Munroe, MD;  Location: [place];  Service: Cardiovascular;  Laterality: N/A;   TEE WITHOUT CARDIOVERSION N/A 08/05/2020   Procedure: TRANSESOPHAGEAL ECHOCARDIOGRAM (TEE);  Surgeon: Geralynn Rile, MD;  Location: [facility] ENDOSCOPY;  Service: Cardiovascular;  Laterality: N/A;   THYROIDECTOMY       OB History     Gravida  6   Para      Term      Preterm      AB      Living         SAB      IAB      Ectopic      Multiple      Live Births               Family History  Problem Relation Age of Onset   Heart disease Mother    Hypertension Mother    Cancer Father    Alcoholism Father     Social History   Tobacco Use   Smoking status: Never   Smokeless tobacco: Never  Vaping Use   Vaping Use: Never used  Substance Use Topics   Alcohol use: No    Home Medications Prior to Admission medications   Medication Sig Start Date End Date Taking? Authorizing Provider  Alcohol Swabs (B-D SINGLE USE SWABS REGULAR) PADS Use to test blood sugar up to 3 times daily 01/11/21   Shamleffer, Melanie Crazier, MD  allopurinol (ZYLOPRIM) 100 MG tablet Take 1 tablet by mouth once daily 12/09/20   [name], [name], MD  Ascorbic Acid (VITAMIN C) 1000 MG tablet Take 1,000  mg by mouth daily.    [provider]  atorvastatin (LIPITOR) 20 MG tablet Take 1 tablet (20 mg total) by mouth daily. 09/23/20   [name], [name], MD  Blood Glucose Monitoring Suppl (ACCU-CHEK GUIDE) w/Device KIT 1 Device by Does not apply route 3 (three) times daily. 06/10/19   [name], [name], MD  cetirizine (ZYRTEC) 10 MG tablet Take 10 mg by mouth daily.    [provider]  Cholecalciferol (VITAMIN D3) 50 MCG (2000 UT) capsule Take 2,000 Units by mouth daily.    [provider]  clotrimazole-betamethasone (LOTRISONE) cream Apply 1 application topically 2 (two) times daily as needed. 09/09/19   [name], [name], MD  Continuous Blood Gluc Sensor (DEXCOM G6 SENSOR) MISC 1 Device by Does not apply route as directed. 01/08/21   Shamleffer, Melanie Crazier, MD  Continuous Blood Gluc Transmit (DEXCOM G6 TRANSMITTER) MISC 1 Device by Does not apply route as directed. 01/08/21   Shamleffer, Melanie Crazier, MD  diclofenac Sodium (VOLTAREN) 1 % GEL Apply 1 application topically daily as needed (arthritis pain/hands).    [provider]  famotidine (PEPCID) 20 MG tablet Take 1 tablet (20 mg total) by mouth at bedtime. 11/03/20   [name], [name], MD  FARXIGA 10 MG TABS  tablet Take 10 mg by mouth daily. 01/23/21   [provider]  fluticasone (FLONASE) 50 MCG/ACT nasal spray Place 1 spray into both nostrils daily.    [provider]  gabapentin (NEURONTIN) 600 MG tablet Take 1 tablet by mouth twice daily 12/09/20   [name], [name], MD  glucose blood (ACCU-CHEK GUIDE) test strip Use to test blood sugar up to 3 times daily 01/11/21   Shamleffer, Melanie Crazier, MD  insulin glargine, 1 Unit Dial, (TOUJEO SOLOSTAR) 300 UNIT/ML Solostar Pen Inject 26 Units into the skin daily. Eat a snack with protein nightly before bedtime. 09/09/20   Shamleffer, Melanie Crazier, MD  insulin lispro (HUMALOG KWIKPEN) 100 UNIT/ML KwikPen Max daily 15 units 09/09/20   Shamleffer, Melanie Crazier, MD  Insulin Pen Needle 32G X 4 MM MISC 1 Device by Does not apply route in the morning, at noon, in the evening, and at bedtime. 01/11/21   Shamleffer, Melanie Crazier, MD  lactulose (CHRONULAC) 10 GM/15ML solution Take 20 g by mouth daily as needed for mild constipation.     [provider]  Lancets Misc. (ACCU-CHEK FASTCLIX LANCET) KIT Use to test blood sugar up to 3 times daily 03/13/20   [name], [name], MD  levothyroxine (SYNTHROID) 150 MCG tablet Take 1 tablet (150 mcg total) by mouth daily before breakfast. 06/25/20   [name], [name], MD  magnesium oxide (MAG-OX) 400 (241.3 Mg) MG tablet Take 1 tablet by mouth once daily 12/09/20   [name], [name], MD  meclizine (ANTIVERT) 25 MG tablet Take 25 mg by mouth daily as needed for dizziness.  10/01/14   [provider]  metoprolol tartrate (LOPRESSOR) 100 MG tablet Take 0.5 tablets (50 mg total) by mouth 2 (two) times daily. 07/10/20   O'Neal, Cassie Freer, MD  nitroGLYCERIN (NITROSTAT) 0.4 MG SL tablet Place 1 tablet (0.4 mg total) under the tongue every 5 (five) minutes as needed for chest pain. 05/07/20   Barrett, Evelene Croon, PA-C  Omega-3 Fatty Acids (FISH OIL) 1000 MG CAPS Take 2,000 mg by mouth daily.     [provider]  Propylene Glycol (SYSTANE BALANCE OP) Place 1 drop into both eyes daily as needed (for dry  eyes).    [provider]  Tafamidis 61 MG CAPS Take 61 mg by mouth daily. 06/11/20   O'NealCassie Freer, MD  tiZANidine (ZANAFLEX) 4 MG tablet Take 0.5-1 tablets (2-4 mg total) by mouth at bedtime as needed for muscle spasms. Patient not taking: Reported on 02/22/2021 02/15/21   [name], [name], MD  torsemide (DEMADEX) 20 MG tablet Take 2 tablets (40 mg total) by mouth daily. Ok to take extra dose if wt gain of 1 lb in 1 day or 5 lbs in 7 days 04/02/21 05/02/21  Pearson Grippe, DO  warfarin (COUMADIN) 5 MG tablet Take 1 tablet (5 mg total) by mouth daily. Patient taking differently: Take 2.5 mg by mouth See admin instructions. Take 2.5 mg Mon., Wed., Fri. Take 5 mg all the other days at bedtime 07/10/20   O'Neal, Cassie Freer, MD    Allergies    Ace inhibitors, Amlodipine, Atenolol, Avandia [rosiglitazone], Darvon [propoxyphene], Erythromycin, Hydralazine, Hydrocodone, Levofloxacin, Morphine and related, Percocet [oxycodone-acetaminophen], Spironolactone, and Tramadol  Review of Systems   Review of Systems  Constitutional:  Negative for chills, diaphoresis, fatigue and fever.  HENT:  Negative for ear pain, sore throat and trouble swallowing.   Eyes:  Negative for pain and visual disturbance.  Respiratory:  Positive for shortness of breath (resolved). Negative for cough.   Cardiovascular:  Positive for chest pain and leg swelling (improving). Negative for palpitations, orthopnea, claudication, syncope, PND and near-syncope.  Gastrointestinal:  Negative for abdominal pain, anorexia, heartburn, nausea and vomiting.  Genitourinary:  Negative for dysuria and hematuria.  Musculoskeletal:  Negative for arthralgias and back pain.  Skin:  Negative for color change and rash.  Neurological:  Negative for dizziness, seizures, syncope, weakness, numbness and headaches.  All other systems  reviewed and are negative.  Physical Exam Updated Vital Signs BP (!) 118/58   Pulse (!) 54   Temp 98.6 F (37 C)   Resp 14   SpO2 99%   Physical Exam Vitals and nursing note reviewed.  Constitutional:      General: She is not in acute distress.    Appearance: She is well-developed and overweight. She is not ill-appearing or toxic-appearing.  HENT:     Head: Normocephalic and atraumatic.  Eyes:     Conjunctiva/sclera: Conjunctivae normal.  Neck:     Vascular: No JVD.  Cardiovascular:     Rate and Rhythm: Normal rate. Rhythm regularly irregular.     Pulses:          Radial pulses are 2+ on the right side and 2+ on the left side.       Dorsalis pedis pulses are 2+ on the right side and 2+ on the left side.     Heart sounds: No murmur heard.    Comments:  Reports LE edema is actually better than usual  Pulmonary:     Effort: Pulmonary effort is normal. No tachypnea, accessory muscle usage or respiratory distress.     Breath sounds: Normal breath sounds. No decreased air movement. No rales.  Abdominal:     Palpations: Abdomen is soft.     Tenderness: There is no abdominal tenderness.  Musculoskeletal:     Cervical back: Full passive range of motion without pain, normal range of motion and neck supple.     Right lower leg: 1+ Pitting Edema present.     Left lower leg: 1+ Pitting Edema present.  Skin:    General: Skin is warm and dry.  Neurological:  General: No focal deficit present.     Mental Status: She is alert and oriented to person, place, and time.     GCS: GCS eye subscore is 4. GCS verbal subscore is 5. GCS motor subscore is 6.     Cranial Nerves: Cranial nerves are intact.     Sensory: Sensation is intact.     Motor: Motor function is intact.    ED Results / Procedures / Treatments   Labs (all labs ordered are listed, but only abnormal results are displayed) Labs Reviewed  BASIC METABOLIC PANEL - Abnormal; Notable for the following components:       Result Value   BUN 39 (*)    Creatinine, Ser 2.04 (*)    GFR, Estimated 25 (*)    All other components within normal limits  PROTIME-INR - Abnormal; Notable for the following components:   Prothrombin Time 24.0 (*)    INR 2.2 (*)    All other components within normal limits  TROPONIN I (HIGH SENSITIVITY) - Abnormal; Notable for the following components:   Troponin I (High Sensitivity) 61 (*)    All other components within normal limits  TROPONIN I (HIGH SENSITIVITY) - Abnormal; Notable for the following components:   Troponin I (High Sensitivity) 63 (*)    All other components within normal limits  CBC  BRAIN NATRIURETIC PEPTIDE    EKG EKG Interpretation  Date/Time:  Friday April 02 2021 13:20:01 EDT Ventricular Rate:  83 PR Interval:    QRS Duration: 82 QT Interval:  386 QTC Calculation: 453 R Axis:   3 Text Interpretation: Atrial flutter with variable A-V block Low voltage QRS Cannot rule out Anterior infarct , age undetermined similar to Dec 2021 Confirmed by Sherwood Gambler [phone]) on 04/02/2021 3:26:23 PM  Radiology DG Chest 2 View  Result Date: 04/02/2021 CLINICAL DATA:  Chest pain/pressure radiating into the left arm and neck. History of hypertension, diabetes and congestive heart failure. EXAM: CHEST - 2 VIEW COMPARISON:  Radiograph 04/24/2020.  CT 02/17/2020. FINDINGS: Stable cardiomegaly with aortic atherosclerosis and vascular congestion. No overt pulmonary edema, confluent airspace opacity, pneumothorax or significant pleural effusion. The bones appear unchanged status post right shoulder reverse arthroplasty. There are degenerative changes throughout the spine. IMPRESSION: Cardiomegaly and chronic vascular congestion. No overt pulmonary edema. Electronically Signed   By: Richardean Sale M.D.   On: 04/02/2021 14:22    Procedures Procedures   Medications Ordered in ED Medications  furosemide (LASIX) injection 40 mg (40 mg Intravenous Given 04/02/21 1655)    ED  Course  I have reviewed the triage vital signs and the nursing notes.  Pertinent labs & imaging results that were available during my care of the patient were reviewed by me and considered in my medical decision making (see chart for details).  Clinical Course as of 04/02/21 2032  Fri Apr 02, 2021  1831 Troponin I (High Sensitivity)(!) Troponins elevated but flat which is reassuring. Continues to be w/o CP in the ED. [ZB]    Clinical Course User Index [ZB] Pearson Grippe, DO   MDM Rules/Calculators/A&P                          This is a 80 year old female with history as above including coronary artery disease, CKD, CHF secondary to cardiac amyloidosis, hypertension and hyperlipidemia who presented to the emergency department after an episode of chest pain that started approximately 2.5 hours prior to arrival and resolved prior  to arriving. Exam not consistent with CHF exacerbation however ordered for chest x-ray and BNP to further evaluate.  Doubt flash pulmonary edema based on exam and vitals. Prior to this episode she was in her normal state of health without any infectious symptoms that would suggest pneumonia.  Primary concern is ACS. EKG as above without evidence of ischemia but does show atrial flutter which is her baseline.  She takes warfarin for anticoagulation.  Followed by EP however not a candidate for cardioversion because she has a known atrial thrombus. Ordered for cardiac biomarkers.  ED course: Patient continued to be without chest pain or shortness of breath while in the emergency department. Chest x-ray showing evidence of CHF without any overt pulmonary edema.  No delta change on troponins.  Impression is hypervolemia related to congestive heart failure.  On reassessment, patient remains asymptomatic.  Patient's son is at bedside and says that she has actually been out of her torsemide for the past 2 days because previously in the months she has had to take extra  doses because her weight has gone up. She has been diuresing well in the emergency department.  I feel as though she is safe and stable for discharge home.  She will increase her torsemide dose to take 60 mg for the next 3 days and then continue previous prescribed dosing.  She will call her cardiologist in the morning to schedule an appointment.  Final Clinical Impression(s) / ED Diagnoses Final diagnoses:  Hypervolemia due to congestive heart failure [facility])    Rx / DC Orders ED Discharge Orders          Ordered    torsemide (DEMADEX) 20 MG tablet  Daily        04/02/21 2027             Pearson Grippe, DO 04/02/21 2032    Sherwood Gambler, MD 04/05/21 1535

## 2021-04-02 NOTE — ED Notes (Signed)
Received verbal report from [place] at this time

## 2021-04-02 NOTE — ED Provider Notes (Signed)
Emergency Medicine Provider Triage Evaluation Note  Regina [name] , a 80 y.o. female  was evaluated in triage.  Pt complains of Chest heaviness that started this morning.  She reports that she feels week.  She reports the pain radiates up to left sided jaw and down left arm. .  Review of Systems  Positive: Chest heaviness Negative: fever  Physical Exam  BP (!) 146/91 (BP Location: Right Arm)   Pulse 77   Temp 98.6 F (37 C)   Resp 16   SpO2 100%  Gen:   Awake, no distress   Resp:  Normal effort  MSK:   Moves extremities without difficulty  Other:  Awake and alert. Speech is not slurred.   Medical Decision Making  Medically screening exam initiated at 1:38 PM.  Appropriate orders placed.  [name] was informed that the remainder of the evaluation will be completed by another provider, this initial triage assessment does not replace that evaluation, and the importance of remaining in the ED until their evaluation is complete.     Lorin Glass, PA-C 04/02/21 1339    Malvin Johns, MD 04/02/21 [phone]

## 2021-04-02 NOTE — ED Triage Notes (Signed)
Pt here  from home with c/o chest heaviness and some slight sob . No n/v

## 2021-04-02 NOTE — Telephone Encounter (Signed)
Received call from patient c/o chest pain radiating to her left shoulder and up the left side of her neck started about 1 hour ago.  She was cutting up food when this started but she sat down when it started to try to relax.    Denies SOB, N/V/D.   Has not taken NTG.   BP this AM 95/59 HR 83, BP 102/49 HR 73.    Advised due to active symptoms to call 911 or proceed to ER for evaluation.  Advised not to drive, she states she will call 911 or her son with take her.

## 2021-04-02 NOTE — Telephone Encounter (Signed)
Pt c/o of Chest Pain: STAT if CP now or developed within 24 hours  1. Are you having CP right now? Patient says it it not pain but its heaviness on her chest  2. Are you experiencing any other symptoms (ex. SOB, nausea, vomiting, sweating)? Little sob  3. How long have you been experiencing CP? 1hr in half  4. Is your CP continuous or coming and going? continuous  5. Have you taken Nitroglycerin? No  Pt c/o Shortness Of Breath: STAT if SOB developed within the last 24 hours or pt is noticeably SOB on the phone  1. Are you currently SOB (can you hear that pt is SOB on the phone)? yes  2. How long have you been experiencing SOB? 1 hr in half  3. Are you SOB when sitting or when up moving around? both  4. Are you currently experiencing any other symptoms? Left shoulder is hurting  ?

## 2021-04-02 NOTE — ED Notes (Signed)
Up to bathroom with assist.

## 2021-04-05 NOTE — Telephone Encounter (Signed)
Attempted to contact patient, unable to reach anyone- number rang x8 times, no voicemail. Will try again later.

## 2021-04-05 NOTE — Telephone Encounter (Signed)
    Pt is calling to f/u if she can get an appt with Dr. Audie Box

## 2021-04-05 NOTE — Telephone Encounter (Signed)
Spoke with Dr.O'Neal he would like to see patient in 1-2 weeks for a follow up from hospital visit.  I will call patient today with an appointment.

## 2021-04-06 NOTE — Telephone Encounter (Signed)
Called patient, she is scheduled at [facility] on 06/30 at 8:00 AM. Patient given address, her son and daughter normally bring her and if they have questions they are told to call me.  Patient verbalized understanding.

## 2021-04-08 ENCOUNTER — Telehealth: Payer: Self-pay | Admitting: Pharmacist

## 2021-04-08 NOTE — Chronic Care Management (AMB) (Signed)
Chronic Care Management Pharmacy Assistant   Name: Regina [name]  MRN: [id number] DOB: 1941/10/09  Regina [name] is an 80 y.o. year old female who presents for herinitial CCM visit with the clinical pharmacist.  Reason for Encounter: Chart prep for initial visit with Regina [name] on 04/13/21.    Conditions to be addressed/monitored: CHF, CAD, HTN, HLD, DMII, and CKD, Osteoarthritis, Hypothyroidism, Persistent A-FIB , History of Hepatitis C and GERD.  Primary concerns for visit include: Heart failure and A-FIB.  Recent office visits:  02/15/21 Regina [name] MD (PCP) - seen for cramps of lower extremity and other issues. Patient started on tizanidine 2-4 mg orally at bedtime as needed. Follow up as needed.   09/30/20 Regina [name] MD (PCP) -  seen for left shoulder pain and other issues. Discontinued cyclobenzaprine 72m. Placed referral to physical therapy. Follow up as needed.   Recent consult visits:  01/26/21 CSande RivesPA-C (Cardiology) -  patient presented to clinic for hereditary cardiac amyloidosis and other chronic conditions. Discontinued iron vitamins, loratadine and polyethylene glycol. Follow up in 3 months.   01/08/21 ICloyd StagersMD (Endocrinology) - patient presented to clinic for type 2 diabetes and other chronic issues. No medication changes. Follow up in 4 months.   01/07/21 SAhmed PrimaMD (Neurology) -  patient presented to clinic for follow up of history of CVA and A-FIB. No medication changes or follow up noted.   11/13/20 TGeralynn RileMD (Cardiology) -  patient presented to clinic for cardiac amyloidosis and other chronic issues. No medication changes and follow up in 3 months.   Hospital visits:  Medication Reconciliation was completed by comparing discharge summary, patient's EMR and Pharmacy list, and upon discussion with patient.  [facility] Hypervolemia due to congestive  heart failure and was present for 7 hours.  [facility] -[facility] None.  Medication Changes at Hospital Discharge: -Changed None.   Medications Discontinued at Hospital Discharge: -Stopped None.   Medications that remain the same after Hospital Discharge:??  -All other medications will remain the same.     Medication Reconciliation was completed by comparing discharge summary, patient's EMR and Pharmacy list, and upon discussion with patient.  [facility] and CVA. Patient was discharged on 12/02/20. Home health care arrangements made.   [facility].  Medication Changes at Hospital Discharge: -Changed None.   Medications Discontinued at Hospital Discharge: -Stopped None.   Medications that remain the same after Hospital Discharge:??  -All other medications will remain the same.     Fill History:  ALLOPURINOL 100MG TAB 03/18/2021 90   ATORVASTATIN CALCIUM  20 MG TABS 04/06/2021 90   FARXIGA  10 MG TABS 03/31/2021 30   FAMOTIDINE  20 MG TABS 11/03/2020 90   GABAPENTIN  600 MG TABS 03/31/2021 30   TOUJEO SOLO 300IU/ML INJ 03/22/2021 46   RELION PEN NEEDLE 32GX4MM MIS 01/11/2021 13   LEVOTHYROXIN 150MCG TAB 12/14/2020 90   MAG OXIDE 400MG TAB 03/18/2021 90   METOPROLOL TARTRATE  100 MG TABS 04/06/2021 30   OZEMPIC  2 MG/1.5ML SOPN 04/07/2020 28   TORSEMIDE  20 MG TABS 04/06/2021 30   WARFARIN SODIUM  5 MG TABS 04/06/2021 90   TiZANidine 4MG      TAB 02/15/2021 30     Medications: Outpatient Encounter Medications as  of 04/08/2021  Medication Sig Note   Alcohol Swabs (B-D SINGLE USE SWABS REGULAR) PADS Use to test blood sugar up to 3 times daily    allopurinol (ZYLOPRIM) 100 MG tablet Take 1 tablet by mouth once daily    Ascorbic Acid (VITAMIN C) 1000 MG tablet Take 1,000 mg by mouth daily.    atorvastatin  (LIPITOR) 20 MG tablet Take 1 tablet (20 mg total) by mouth daily.    Blood Glucose Monitoring Suppl (ACCU-CHEK GUIDE) w/Device KIT 1 Device by Does not apply route 3 (three) times daily.    cetirizine (ZYRTEC) 10 MG tablet Take 10 mg by mouth daily.    Cholecalciferol (VITAMIN D3) 50 MCG (2000 UT) capsule Take 2,000 Units by mouth daily.    clotrimazole-betamethasone (LOTRISONE) cream Apply 1 application topically 2 (two) times daily as needed.    Continuous Blood Gluc Sensor (DEXCOM G6 SENSOR) MISC 1 Device by Does not apply route as directed.    Continuous Blood Gluc Transmit (DEXCOM G6 TRANSMITTER) MISC 1 Device by Does not apply route as directed.    diclofenac Sodium (VOLTAREN) 1 % GEL Apply 1 application topically daily as needed (arthritis pain/hands).    famotidine (PEPCID) 20 MG tablet Take 1 tablet (20 mg total) by mouth at bedtime.    FARXIGA 10 MG TABS tablet Take 10 mg by mouth daily.    fluticasone (FLONASE) 50 MCG/ACT nasal spray Place 1 spray into both nostrils daily.    gabapentin (NEURONTIN) 600 MG tablet Take 1 tablet by mouth twice daily    glucose blood (ACCU-CHEK GUIDE) test strip Use to test blood sugar up to 3 times daily    insulin glargine, 1 Unit Dial, (TOUJEO SOLOSTAR) 300 UNIT/ML Solostar Pen Inject 26 Units into the skin daily. Eat a snack with protein nightly before bedtime.    insulin lispro (HUMALOG KWIKPEN) 100 UNIT/ML KwikPen Max daily 15 units 01/08/2021: Using when needed   Insulin Pen Needle 32G X 4 MM MISC 1 Device by Does not apply route in the morning, at noon, in the evening, and at bedtime.    lactulose (CHRONULAC) 10 GM/15ML solution Take 20 g by mouth daily as needed for mild constipation.     Lancets Misc. (ACCU-CHEK FASTCLIX LANCET) KIT Use to test blood sugar up to 3 times daily    levothyroxine (SYNTHROID) 150 MCG tablet Take 1 tablet (150 mcg total) by mouth daily before breakfast.    magnesium oxide (MAG-OX) 400 (241.3 Mg) MG tablet Take 1 tablet  by mouth once daily    meclizine (ANTIVERT) 25 MG tablet Take 25 mg by mouth daily as needed for dizziness.  01/08/2021: prn   metoprolol tartrate (LOPRESSOR) 100 MG tablet Take 0.5 tablets (50 mg total) by mouth 2 (two) times daily.    nitroGLYCERIN (NITROSTAT) 0.4 MG SL tablet Place 1 tablet (0.4 mg total) under the tongue every 5 (five) minutes as needed for chest pain.    Omega-3 Fatty Acids (FISH OIL) 1000 MG CAPS Take 2,000 mg by mouth daily.     Propylene Glycol (SYSTANE BALANCE OP) Place 1 drop into both eyes daily as needed (for dry eyes).    Tafamidis 61 MG CAPS Take 61 mg by mouth daily.    tiZANidine (ZANAFLEX) 4 MG tablet Take 0.5-1 tablets (2-4 mg total) by mouth at bedtime as needed for muscle spasms. (Patient not taking: Reported on 02/22/2021)    torsemide (DEMADEX) 20 MG tablet Take 2 tablets (40 mg total) by mouth  daily. Ok to take extra dose if wt gain of 1 lb in 1 day or 5 lbs in 7 days    warfarin (COUMADIN) 5 MG tablet Take 1 tablet (5 mg total) by mouth daily. (Patient taking differently: Take 2.5 mg by mouth See admin instructions. Take 2.5 mg Mon., Wed., Fri. Take 5 mg all the other days at bedtime) 01/08/2021: 84m only on Sunday, other days 2.57m  No facility-administered encounter medications on file as of 04/08/2021.    Have you seen any other providers since your last visit?  Yes. Patient went to the hospital.  Any changes in your medications or health?  Yes. Patient has been having issues with her heart failure. No medication changes lately.  Any side effects from any medications?  Not that she knows of.  Do you have an symptoms or problems not managed by your medications?  Patient feels like her medications are working pretty well except here lately her blood pressure has been running a little lower since taking metoprolol. Blood pressure this morning was 96/ 60, 6/21 100/55 P: 86, 6/22 90/91.  Any concerns about your health right now?  Well patient feels as far as  her health she's not worried about herself because she takes stuff one day at a time and does what the doctors tell her to do. She always tells her children when she's not feeling well. Has your provider asked that you check blood pressure, blood sugar, or follow special diet at home?  Yes. Patient keeps a check on her blood pressure, blood sugar and follows a heart healthy diet at home. Patient is now using a dexcom. Do you get any type of exercise on a regular basis?  Yes, patient cooks and cleans her home. She said every weekend her son takes her to [place] on the weekends with her family. Her daughter takes her shopping. Her son works at her house. Patient does word searches and reads her bible and prays. Patient lives on the 3rd floor and goes up and down the stairs when she goes out. Can you think of a goal you would like to reach for your health? Patient states as far as goal she would like to maintain her health and not get worse. She wishes to also get her heart out of A-FIB. Patient states she's had multiple procedures to and tried a lot of different medications to try and get her heart out of A-FIB and have been unsuccessful.  Do you have any problems getting your medications?  No.  Is there anything that you would like to discuss during the appointment?  Patient is not sure right now.   Please bring medications and supplements to appointment   Notes: Reviewed medications with patient and she reports taking all medications as prescribed and no issues at this time. Patient states she understands her medications and what they are all for. Reminded patient of appointment date and time and that it is over the phone.   Star Rating Drugs:  [facility] 2014m [phone]

## 2021-04-13 ENCOUNTER — Ambulatory Visit (INDEPENDENT_AMBULATORY_CARE_PROVIDER_SITE_OTHER): Payer: Medicare Other | Admitting: Pharmacist

## 2021-04-13 DIAGNOSIS — E1140 Type 2 diabetes mellitus with diabetic neuropathy, unspecified: Secondary | ICD-10-CM

## 2021-04-13 DIAGNOSIS — Z794 Long term (current) use of insulin: Secondary | ICD-10-CM

## 2021-04-13 DIAGNOSIS — I10 Essential (primary) hypertension: Secondary | ICD-10-CM | POA: Diagnosis not present

## 2021-04-13 NOTE — Progress Notes (Signed)
Chronic Care Management Pharmacy Note  04/14/2021 Name:  HAJRA PORT MRN:  [id number] DOB:  September 12, 1941  Summary: BP is running low per home readings  Recommendations/Changes made from today's visit: -Recommended following up with cardiology due to low BP and to clarify torsemide directions  -Recommend checking uric acid level due to history of gout and no level -Patient is switching to [facility] for adherence packaging -Recommended for patient to stop fish oil as she does not need and was not on a therapeutic dose  Plan: Follow up for BP assessment in 1 month  Subjective: DARNETTA KESSELMAN is an 80 y.o. year old female who is a primary patient of [name], Malka So, MD.  The CCM team was consulted for assistance with disease management and care coordination needs.    Engaged with patient by telephone for initial visit in response to provider referral for pharmacy case management and/or care coordination services.   Consent to Services:  The patient was given the following information about Chronic Care Management services today, agreed to services, and gave verbal consent: 1. CCM service includes personalized support from designated clinical staff supervised by the primary care provider, including individualized plan of care and coordination with other care providers 2. 24/7 contact phone numbers for assistance for urgent and routine care needs. 3. Service will only be billed when office clinical staff spend 20 minutes or more in a month to coordinate care. 4. Only one practitioner may furnish and bill the service in a calendar month. 5.The patient may stop CCM services at any time (effective at the end of the month) by phone call to the office staff. 6. The patient will be responsible for cost sharing (co-pay) of up to 20% of the service fee (after annual deductible is met). Patient agreed to services and consent obtained.  Patient Care Team: [name], [name], MD as PCP -  General (Family Medicine) O'Neal, Cassie Freer, MD as PCP - Cardiology (Internal Medicine) Viona Gilmore, [facility] as Pharmacist (Pharmacist)  Recent office visits: 02/15/21 [name] MD (PCP) - seen for cramps of lower extremity and other issues. Patient started on tizanidine 2-4 mg orally at bedtime as needed. Follow up as needed.  02/04/21 Charlott Rakes, LPN: Patient presented for AWV.   09/30/20 [name] MD (PCP) -  seen for left shoulder pain and other issues. Discontinued cyclobenzaprine 22m. Placed referral to physical therapy. Follow up as needed.  Recent consult visits: 02/22/21 LAntonieta Iba RD (endo): Patient presented for diet & nutrition education for diabetes.  02/05/21 MFrederik Schmidt RN (cardiology): Patient presented for anti-coag visit. INR 2.4, goal 2-3. Continued warfarin 5 mg (5 mg x 1) every Sun; 2.5 mg (5 mg x 0.5) all other days.  01/26/21 Callie GSarajane JewsPA-C (Cardiology) -  patient presented to clinic for hereditary cardiac amyloidosis and other chronic conditions. Discontinued iron vitamins, loratadine and polyethylene glycol. Follow up in 3 months.   01/08/21 ICloyd StagersMD (Endocrinology) - patient presented to clinic for type 2 diabetes and other chronic issues. No medication changes. Follow up in 4 months.   01/07/21 SAhmed PrimaMD (Neurology) -  patient presented to clinic for follow up of history of CVA and A-FIB. No medication changes or follow up noted.   11/13/20 TGeralynn RileMD (Cardiology) -  patient presented to clinic for cardiac amyloidosis and other chronic issues. No medication changes and follow up in 3 months.  [facility] to Hypervolemia due to congestive heart failure and was present for 7 hours.   New? Medications Started at [facility] Discharge:?? -started None.   Medication Changes at Hospital Discharge: -Changed None.    Medications Discontinued at  Hospital Discharge: -Stopped None.    Medications that remain the same after Hospital Discharge:?? -All other medications will remain the same.       Medication Reconciliation was completed by comparing discharge summary, patient's EMR and Pharmacy list, and upon discussion with patient.   Patient visited [facility] ER on 12/01/20 due to Weakness and CVA. Patient was discharged on 12/02/20. Home health care arrangements made.    New? Medications Started at [facility] Discharge:?? -started None.   Medication Changes at Hospital Discharge: -Changed None.    Medications Discontinued at Hospital Discharge: -Stopped None.    Medications that remain the same after Hospital Discharge:?? -All other medications will remain the same.    Objective:  Lab Results  Component Value Date   CREATININE 2.04 (H) 04/02/2021   BUN 39 (H) 04/02/2021   GFR 20.67 (L) 02/15/2021   GFRNONAA 25 (L) 04/02/2021   GFRAA 30 (L) 08/03/2020   NA 138 04/02/2021   K 3.9 04/02/2021   CALCIUM 9.1 04/02/2021   CO2 29 04/02/2021   GLUCOSE 97 04/02/2021    Lab Results  Component Value Date/Time   HGBA1C 7.4 (A) 01/08/2021 02:20 PM   HGBA1C 7.4 (A) 09/09/2020 03:24 PM   HGBA1C 7.6 (H) 03/24/2020 02:28 PM   HGBA1C 7.8 (H) 09/09/2019 08:57 AM   FRUCTOSAMINE 325 (H) 09/09/2019 08:57 AM   FRUCTOSAMINE 349 (H) 05/22/2019 04:31 PM   GFR 20.67 (L) 02/15/2021 11:32 AM   GFR 39.05 (L) 09/09/2019 08:57 AM   MICROALBUR 1.9 05/22/2019 04:31 PM    Last diabetic Eye exam:  Lab Results  Component Value Date/Time   HMDIABEYEEXA Retinopathy (A) 06/18/2019 12:00 AM    Last diabetic Foot exam: No results found for: HMDIABFOOTEX   Lab Results  Component Value Date   CHOL 117 03/24/2020   HDL 56 03/24/2020   LDLCALC 49 03/24/2020   TRIG 54 03/24/2020   CHOLHDL 2 05/22/2019    Hepatic Function Latest Ref Rng & Units 04/25/2020 04/21/2020 03/24/2020  Total Protein 6.5 - 8.1 g/dL 7.7 7.8 8.4  Albumin 3.5 -  5.0 g/dL 3.6 - 4.8(H)  AST 15 - 41 U/L 24 - 19  ALT 0 - 44 U/L 18 - 11  Alk Phosphatase 38 - 126 U/L 51 - 85  Total Bilirubin 0.3 - 1.2 mg/dL 0.7 - 0.3  Bilirubin, Direct 0.0 - 0.2 mg/dL <0.1 - -    Lab Results  Component Value Date/Time   TSH 3.24 06/08/2020 08:35 AM   TSH 5.783 (H) 04/25/2020 01:22 AM   TSH 3.410 03/12/2020 10:51 AM    CBC Latest Ref Rng & Units 04/02/2021 08/03/2020 06/29/2020  WBC 4.0 - 10.5 K/uL 6.6 7.1 12.4(H)  Hemoglobin 12.0 - 15.0 g/dL 12.0 12.1 13.6  Hematocrit 36.0 - 46.0 % 37.7 36.8 41.3  Platelets 150 - 400 K/uL 249 362 353    Lab Results  Component Value Date/Time   VD25OH 55.3 03/24/2020 02:28 PM   VD25OH 59.65 05/22/2019 04:31 PM    Clinical ASCVD: Yes  The ASCVD Risk score Mikey Bussing DC Jr., et al., 2013) failed to calculate for the following reasons:   The patient has a prior MI or stroke diagnosis    Depression screen [facility] 2/9  02/22/2021 02/10/2021 03/24/2020  Decreased Interest 0 0 1  Down, Depressed, Hopeless 0 0 0  PHQ - 2 Score 0 0 1  Altered sleeping - - 3  Tired, decreased energy - - 3  Change in appetite - - 0  Feeling bad or failure about yourself  - - 0  Trouble concentrating - - 0  Moving slowly or fidgety/restless - - 0  Suicidal thoughts - - 0  PHQ-9 Score - - 7  Difficult doing work/chores - - Somewhat difficult    CHA2DS2/VAS Stroke Risk Points  Current as of 11 minutes ago     9 >= 2 Points: High Risk  1 - 1.99 Points: Medium Risk  0 Points: Low Risk    Last Change:       Details    This score determines the patient's risk of having a stroke if the  patient has atrial fibrillation.       Points Metrics  1 Has Congestive Heart Failure:  Yes    Current as of 11 minutes ago  1 Has Vascular Disease:  Yes    Current as of 11 minutes ago  1 Has Hypertension:  Yes    Current as of 11 minutes ago  2 Age:  63    Current as of 11 minutes ago  1 Has Diabetes:  Yes    Current as of 11 minutes ago  2 Had Stroke:  Yes  Had  TIA:  No  Had Thromboembolism:  No    Current as of 11 minutes ago  1 Female:  Yes    Current as of 11 minutes ago      Social History   Tobacco Use  Smoking Status Never  Smokeless Tobacco Never   BP Readings from Last 3 Encounters:  04/02/21 117/62  02/15/21 128/76  01/26/21 132/70   Pulse Readings from Last 3 Encounters:  04/02/21 78  02/15/21 87  01/26/21 (!) 58   Wt Readings from Last 3 Encounters:  02/22/21 219 lb (99.3 kg)  02/15/21 204 lb (92.5 kg)  01/26/21 220 lb 6.4 oz (100 kg)   BMI Readings from Last 3 Encounters:  02/22/21 40.06 kg/m  02/15/21 37.31 kg/m  01/26/21 40.31 kg/m    Assessment/Interventions: Review of patient past medical history, allergies, medications, health status, including review of consultants reports, laboratory and other test data, was performed as part of comprehensive evaluation and provision of chronic care management services.   SDOH:  (Social Determinants of Health) assessments and interventions performed: Yes SDOH Interventions    Flowsheet Row Most Recent Value  SDOH Interventions   Financial Strain Interventions Intervention Not Indicated  Transportation Interventions Intervention Not Indicated      SDOH Screenings   Alcohol Screen: Not on file  Depression (PHQ2-9): Low Risk    PHQ-2 Score: 0  Financial Resource Strain: Low Risk    Difficulty of Paying Living Expenses: Not hard at all  Food Insecurity: No Food Insecurity   Worried About Charity fundraiser in the Last Year: Never true   Ran Out of Food in the Last Year: Never true  Housing: Low Risk    Last Housing Risk Score: 0  Physical Activity: Inactive   Days of Exercise per Week: 0 days   Minutes of Exercise per Session: 0 min  Social Connections: Moderately Isolated   Frequency of Communication with Friends and Family: More than three times a week   Frequency of Social Gatherings with Friends and  Family: Twice a week   Attends Religious Services: More  than 4 times per year   Active Member of Clubs or Organizations: No   Attends Archivist Meetings: Never   Marital Status: Widowed  Stress: No Stress Concern Present   Feeling of Stress : Not at all  Tobacco Use: Low Risk    Smoking Tobacco Use: Never   Smokeless Tobacco Use: Never  Transportation Needs: No Transportation Needs   Lack of Transportation (Medical): No   Lack of Transportation (Non-Medical): No   Patient stays in bed until 7:30-8:30 and sets an alarm at 9 am. She tries to eat at 8:30 but her alarm goes off for her metoprolol. She then checks her blood sugar, blood pressure and weight. She is living with her son right now and he works from home. She then reads her Bible, watches TV and colors and uses word searches.  Patient then preps for dinner at 3pm and then cooks at 5pm and then eats dinner. She then watches TV and then goes to bed. Her friend comes and gets her and takes her out some days and she is no longer driving. Patient always eats a protein, vegetable and starch and fruit 3 times a day. Patient mostly bakes and broils food. She mostly eats chicken and fish (salmon) and rarely hamburger. She fries the fish.    Patient sleeps well at night and sometimes she goes to bed really late and sometimes goes to bed early. Patient denies problems with falling asleep or staying asleep. Patient feels mostly rested during the day and takes some naps.  Patient does not exercise because of her heart condition and she is afraid to do exercises. Patient is walking in stores. Patient is going to the doctor on Thursday. Patient walks up 3 sets of stairs to go into her apartment.   Patient is not aware of any problems with her medications.   Patient does not drive anymore.  CCM Care Plan  Allergies  Allergen Reactions   Ace Inhibitors Other (See Comments)    unknown   Amlodipine Other (See Comments)    unknown   Atenolol Other (See Comments)    bradycardia   Avandia  [Rosiglitazone] Other (See Comments)    unknown   Darvon [Propoxyphene] Other (See Comments)    unknown   Erythromycin Itching   Hydralazine Other (See Comments)    Burning in throat and chest   Hydrocodone Other (See Comments)    Hallucinations.   Levofloxacin Itching   Morphine And Related Other (See Comments)    Dizzy and hallucianation, vomiting; Willing to try low dose   Percocet [Oxycodone-Acetaminophen] Other (See Comments)    hallucination   Spironolactone Other (See Comments)    unknown   Tramadol Other (See Comments)    Unknown/does not recall reaction but does not want to take again    Medications Reviewed Today     Reviewed by Viona Gilmore, [facility] (Pharmacist) on 04/14/21 at 1132  Med List Status: <None>   Medication Order Taking? Sig Documenting Provider Last Dose Status Informant  Alcohol Swabs (B-D SINGLE USE SWABS REGULAR) PADS [id number]  Use to test blood sugar up to 3 times daily Shamleffer, Melanie Crazier, MD  Active   allopurinol (ZYLOPRIM) 100 MG tablet [id number] Yes Take 1 tablet by mouth once daily [name], [name], MD Taking Active   Ascorbic Acid (VITAMIN C) 1000 MG tablet [id number] Yes Take 1,000 mg by mouth daily. [provider]  Taking Active Self  atorvastatin (LIPITOR) 20 MG tablet [id number] Yes Take 1 tablet (20 mg total) by mouth daily. [name], [name], MD Taking Active   Blood Glucose Monitoring Suppl (ACCU-CHEK GUIDE) w/Device KIT [id number]  1 Device by Does not apply route 3 (three) times daily. [name], [name], MD  Active Self  cetirizine (ZYRTEC) 10 MG tablet [id number] Yes Take 10 mg by mouth daily. [provider] Taking Active   Cholecalciferol (VITAMIN D3) 50 MCG (2000 UT) capsule [id number] Yes Take 2,000 Units by mouth daily. [provider] Taking Active Self  Continuous Blood Gluc Sensor (DEXCOM G6 SENSOR) MISC [id number] Yes 1 Device by Does not apply route as directed. Shamleffer, Melanie Crazier, MD Taking  Active   Continuous Blood Gluc Transmit (DEXCOM G6 TRANSMITTER) MISC [id number]  1 Device by Does not apply route as directed. Shamleffer, Melanie Crazier, MD  Active   diclofenac Sodium (VOLTAREN) 1 % GEL [id number] Yes Apply 1 application topically daily as needed (arthritis pain/hands). [provider] Taking Active Self  famotidine (PEPCID) 20 MG tablet [id number] Yes Take 1 tablet (20 mg total) by mouth at bedtime. [name], [name], MD Taking Active   FARXIGA 10 MG TABS tablet [id number] Yes Take 10 mg by mouth daily. [provider] Taking Active   fluticasone (FLONASE) 50 MCG/ACT nasal spray [id number] Yes Place 1 spray into both nostrils daily. [provider] Taking Active Self  gabapentin (NEURONTIN) 600 MG tablet [id number] Yes Take 1 tablet by mouth twice daily [name], Malka So, MD Taking Active   glucose blood (ACCU-CHEK GUIDE) test strip [id number]  Use to test blood sugar up to 3 times daily Shamleffer, Melanie Crazier, MD  Active   insulin glargine, 1 Unit Dial, (TOUJEO SOLOSTAR) 300 UNIT/ML Solostar Pen [id number] Yes Inject 26 Units into the skin daily. Eat a snack with protein nightly before bedtime. Shamleffer, Melanie Crazier, MD Taking Active   insulin lispro (HUMALOG KWIKPEN) 100 UNIT/ML KwikPen [id number] Yes Max daily 15 units Shamleffer, Melanie Crazier, MD Taking Active            Med Note Avon Gully, [facility] K   Fri Jan 08, 2021  2:11 PM) Using when needed  Insulin Pen Needle 32G X 4 MM MISC [id number] Yes 1 Device by Does not apply route in the morning, at noon, in the evening, and at bedtime. Shamleffer, Melanie Crazier, MD Taking Active   Lancets Misc. (ACCU-CHEK FASTCLIX LANCET) KIT [id number] Yes Use to test blood sugar up to 3 times daily [name], [name], MD Taking Active Self  levothyroxine (SYNTHROID) 150 MCG tablet [id number] Yes Take 1 tablet (150 mcg total) by mouth daily before breakfast. [name], [name], MD Taking Active Self  meclizine (ANTIVERT)  25 MG tablet [id number] Yes Take 25 mg by mouth daily as needed for dizziness.  [provider] Taking Active Self           Med Note Avon Gully, [facility] K   Fri Jan 08, 2021  2:12 PM) prn  metoprolol tartrate (LOPRESSOR) 100 MG tablet [id number] Yes Take 0.5 tablets (50 mg total) by mouth 2 (two) times daily. Geralynn Rile, MD Taking Active Self  nitroGLYCERIN (NITROSTAT) 0.4 MG SL tablet [id number] Yes Place 1 tablet (0.4 mg total) under the tongue every 5 (five) minutes as needed for chest pain. Barrett, Evelene Croon, PA-C Taking Active   Polyethylene Glycol 3350 (MIRALAX PO) [id number] Yes Take by mouth daily as needed. [provider] Taking Active   Propylene  Glycol (SYSTANE BALANCE OP) [id number] Yes Place 1 drop into both eyes daily as needed (for dry eyes). [provider] Taking Active Self  Tafamidis 61 MG CAPS [id number] Yes Take 61 mg by mouth daily. Geralynn Rile, MD Taking Active Self  tiZANidine (ZANAFLEX) 4 MG tablet [id number] No Take 0.5-1 tablets (2-4 mg total) by mouth at bedtime as needed for muscle spasms.  Patient not taking: No sig reported   [name], [name], MD Not Taking Active   torsemide (DEMADEX) 20 MG tablet [id number] Yes Take 2 tablets (40 mg total) by mouth daily. Ok to take extra dose if wt gain of 1 lb in 1 day or 5 lbs in 7 days Pearson Grippe, DO Taking Active   trolamine salicylate (ASPERCREME) 10 % cream [id number] Yes Apply 1 application topically as needed for muscle pain. [provider] Taking Active   warfarin (COUMADIN) 5 MG tablet [id number] Yes Take 1 tablet (5 mg total) by mouth daily.  Patient taking differently: Take 2.5 mg by mouth See admin instructions. Take 2.5 mg Mon., Wed., Fri. Take 5 mg all the other days at bedtime   O'Neal, Cassie Freer, MD Taking Active Self           Med Note [place]on Gully, [facility] Raliegh Ip   Fri Jan 08, 2021  2:13 PM) 24m only on Sunday, other days 2.573m           Patient Active  Problem List   Diagnosis Date Noted   Type 2 diabetes mellitus with stage 3a chronic kidney disease, with long-term current use of insulin (HC[place]11/29/2021   Type 2 diabetes mellitus with retinopathy, with long-term current use of insulin (HCBonnetsville11/29/2021   Diabetes mellitus (HCClover[place]11/29/2021   Type 2 diabetes mellitus with diabetic polyneuropathy, with long-term current use of insulin (HCSan [place] Cap[place]11/29/2021   Long term (current) use of anticoagulants 07/13/2020   Persistent atrial fibrillation (HCC)    Mixed hyperlipidemia    Chronic diastolic heart failure (HCLux[place]   Demand ischemia (HCC)    Atrial fibrillation (HCNew Carroll[place]07/06/2020   Other fatigue 03/24/2020   Short of breath on exertion 03/24/2020   Congestive heart failure (HCTup[place]06/05/2020   Vitamin D deficiency 03/24/2020   Sleep apnea 05/21/2019   History of hepatitis C 05/21/2019   Insomnia 05/21/2019   CKD (chronic kidney disease), stage III (HCFrank[place]08/01/2019   GERD (gastroesophageal reflux disease) 04/20/2018   Type 2 diabetes mellitus with diabetic neuropathy, unspecified (HCBoard[place]03/02/2017   History of TIA (transient ischemic attack) 12/19/2016   Hyperlipidemia associated with type 2 diabetes mellitus (HCSilver Lake03/02/2017   S/P total knee replacement using cement, right 03/18/2015   Non-obstructive CAD    Cardiovascular stress test abnormal    Pleuritic chest pain 01/16/2015   Acute renal failure superimposed on stage 3 chronic kidney disease (HCU[place]04/10/2014   Obesity, Class III, BMI 40-49.9 (morbid obesity) (HCJac[place]04/10/2014   Essential hypertension 01/16/2015   Hypothyroidism 01/16/2015   OSA on CPAP 01/16/2015   DM type 2 (diabetes mellitus, type 2) (HCAmador04/10/2014   Coronary artery disease involving native coronary artery without angina pectoris 10/02/2014   Physical deconditioning 02/28/2013   Class 3 severe obesity with serious comorbidity and body mass index (BMI) of 40.0 to 44.9 in adult (HM[facility]05/14/2014   Osteoarthritis  02/27/2013    Immunization History  Administered Date(s) Administered   Fluad Quad(high Dose 65+) 06/21/2019   Influenza,inj,Quad PF,6+ Mos 01/17/2015   Influenza-Unspecified 01/17/2015, 10/25/2016  PFIZER(Purple Top)SARS-COV-2 Vaccination 11/06/2019, 11/27/2019, 08/12/2020   Pneumococcal Polysaccharide-23 05/01/2020    Conditions to be addressed/monitored:  Hypertension, Hyperlipidemia, Diabetes, Atrial Fibrillation, Heart Failure, Coronary Artery Disease, GERD, Chronic Kidney Disease, Hypothyroidism, Osteoarthritis, and vitamin D deficiency  Care Plan : CCM Pharmacy Care Plan  Updates made by Viona Gilmore, [place] since 04/14/2021 12:00 AM     Problem: Problem: Hypertension, Hyperlipidemia, Diabetes, Atrial Fibrillation, Heart Failure, Coronary Artery Disease, GERD, Chronic Kidney Disease, Hypothyroidism, Osteoarthritis, and vitamin D deficiency      Long-Range Goal: Patient-Specific Goal   Start Date: 04/13/2021  Expected End Date: 04/13/2022  This Visit's Progress: On track  Priority: High  Note:   Current Barriers:  Unable to independently monitor therapeutic efficacy Unable to achieve control of diabetes   Pharmacist Clinical Goal(s):  Patient will achieve adherence to monitoring guidelines and medication adherence to achieve therapeutic efficacy achieve control of diabetes as evidenced by A1c  through collaboration with PharmD and provider.   Interventions: 1:1 collaboration with [name], [name], MD regarding development and update of comprehensive plan of care as evidenced by provider attestation and co-signature Inter-disciplinary care team collaboration (see longitudinal plan of care) Comprehensive medication review performed; medication list updated in electronic medical record  Hypertension (BP goal <130/80) -Controlled -Current treatment: Metoprolol tartrate 100 mg 1/2 tablet twice daily -Medications previously tried: none  -Current home readings: 100/55 HR  86, 91/53 HR 58, 96/60 HR 82, 86/58 HR 59, 107/54 HR 92, 131/59 HR 53, 141/57 HR 87, 110/60 HR 61, 98/60 HR 87 -Current dietary habits: limits salt intake -Current exercise habits: trying to walk some -Denies hypotensive/hypertensive symptoms -Educated on Exercise goal of 150 minutes per week; Importance of home blood pressure monitoring; Proper BP monitoring technique; -Counseled to monitor BP at home at least weekly, document, and provide log at future appointments -Counseled on diet and exercise extensively Recommended bringing BP cuff to next appointment to compare readings and make sure her machine is not off. Patient reports she has been feeling out of sorts.  Hyperlipidemia: (LDL goal < 70) -Controlled -Current treatment: Atorvastatin 20 mg 1 tablet daily Fish oil 1000 mg 1 capsule twice daily -Medications previously tried: none  -Current dietary patterns: does not fry foods -Current exercise habits: trying to walk some -Educated on Cholesterol goals;  Importance of limiting foods high in cholesterol; Exercise goal of 150 minutes per week; -Counseled on diet and exercise extensively Recommended to continue current medication Recommended to stop fish oil as she likely does not need based on triglycerides.  CAD (Goal: prevent heart events) -Controlled -Current treatment  Warfarin 5 mg as directed by coumadin clinic Nitroglycerin 0.4 mg 1 tablet as needed  -Medications previously tried: none  -Recommended checking nitroglycerin expiration date and making sure her on supply has not expired   Diabetes (A1c goal <7%) -Uncontrolled -Current medications: Farxiga 10 mg 1 tablet daily Toujeo U-300 inject 26 units nightly Humalog U-100 max 15 units daily (sliding scale) -Medications previously tried: n/a  -Current home glucose readings fasting glucose: 79, 109, 90, 105, 105, 79, 79, 105, 93 post prandial glucose: 99, sometimes up to 200, 220 -Denies hypoglycemic/hyperglycemic  symptoms -Current meal patterns:  breakfast: peaches, 2 eggs lunch: same as dinner dinner: sometimes potatoes with protein snacks: popcorn, graham crackers drinks: n/a -Current exercise: trying to walk some -Educated on A1c and blood sugar goals; Continuous glucose monitoring; Carbohydrate counting and/or plate method -Counseled to check feet daily and get yearly eye exams -Counseled on diet and  exercise extensively Recommended to continue current medication Recommended to bring Dexcom to next endocrinology visit. Patient will likely require adjustments for Humalog sliding scale.    Atrial Fibrillation (Goal: prevent stroke and major bleeding) -Controlled -CHADSVASC: 9 -Current treatment: Rate control: Rate control: metoprolol tartrate 100 mg 1/2 tablet twice daily Anticoagulation: warfarin 5 mg as directed by coumadin clinic -Medications previously tried: n/a -Home BP and HR readings: refer to above  -Counseled on increased risk of stroke due to Afib and benefits of anticoagulation for stroke prevention; avoidance of NSAIDs due to increased bleeding risk with anticoagulants; -Counseled on diet and exercise extensively Recommended to continue current medication Educated on keeping green intake consistent with warfarin use.  Heart Failure (Goal: manage symptoms and prevent exacerbations) -Controlled -Last ejection fraction: 10-15% (Date: 08/05/20) -HF type: Systolic -NYHA Class: III (marked limitation of activity) -AHA HF Stage: C (Heart disease and symptoms present) -Current treatment: metoprolol tartrate 100 mg 1/2 tablet twice daily Torsemide 20 mg 1 tablet daily -Medications previously tried: n/a  -Current home BP/HR readings: refer to above -Current dietary habits: uses Mrs. Dash and doesn't use salt -Current exercise habits: trying to walk some -Educated on Importance of weighing daily; if you gain more than 3 pounds in one day or 5 pounds in one week, call  cardiologist Proper diuretic administration and potassium supplementation Importance of blood pressure control -Counseled on dosing of torsemide and recommended verifying dose with cardiology. Patient's weight has been fluctuating and she is taking 2 daily for the last few days which may explain her lower BP.  Hypothyroidism (Goal: TSH 0.35-4.5) -Controlled -Current treatment  Levothyroxine 150 mcg 1 tablet daily -Medications previously tried: none  -Counseled on importance of timing with this medication and taking on an empty stomach. Patient will set an alarm to remember to take daily.  Restless legs syndrome/neuropathy (Goal: minimize symptoms) -Controlled -Current treatment  Gabapentin 600 mg 1 tablet twice daily -Medications previously tried: none  -Recommended to continue current medication  Gout (Goal: prevent flare ups) -Not ideally controlled -Current treatment  Allopurinol 100 mg 1 tablet daily -Medications previously tried: none  -Recommended uric acid level. Patient's hands flare up all the time and she is unsure if this is gout.  Cardiac amyloidosis  (Goal: minimize symptoms) -Controlled -Current treatment  Tafamidis 61 mg 1 capsule daily -Medications previously tried: none  -Recommended to continue current medication Assessed patient finances. Patient reports this is expensive but likely wouldn't qualify for assistance.  GERD (Goal: minimize symptoms) -Controlled -Current treatment  Famotidine 20 mg 1 tablet at bedtime as needed -Medications previously tried: pantoprazole  -Recommended to continue current medication Counseled on differences between pantoprazole and famotidine.  Allergic rhinitis (Goal: minimize symptoms) -Controlled -Current treatment  Cetirizine 10 mg 1 tablet daily Fluticasone 50 mcg/act spray in both nostrils as needed -Medications previously tried: none  -Recommended to continue current medication   Health Maintenance -Vaccine  gaps: shingles, tetanus, COVID booster -Current therapy:  Meclizine 25 mg 1 tablet as needed Magnesium oxide 400 mg 1 tablet once daily Systane balance 1 drop in both eyes as needed Vitamin C 1000 mg 1 tablet daily  Lotrisone cream as needed Diclofenac gel 1% as needed Vitamin D 50 mcg daily (2000 units) Aspercreme as needed -Educated on Cost vs benefit of each product must be carefully weighed by individual consumer -Patient is satisfied with current therapy and denies issues -Recommended to continue current medication  Patient Goals/Self-Care Activities Patient will:  - take medications as prescribed  focus on medication adherence by setting an alarm to remember levothyroxine. check blood pressure at least weekly, document, and provide at future appointments weigh daily, and contact provider if weight gain of > 3 lbs in one day or > 5 lbs in one week target a minimum of 150 minutes of moderate intensity exercise weekly  Follow Up Plan: Telephone follow up appointment with care management team member scheduled for: 3 months       Medication Assistance: None required.  Patient affirms current coverage meets needs.  Compliance/Adherence/Medication fill history: Care Gaps: Eye exam, foot exam, shingrix, COVID booster  Star-Rating Drugs: Atorvastatin 4m - [facility] [place] [facility] [facility]  [place]one: [phone]: [phone] (New Address) - [place] [address] Previously: [address] [address]hone: [phone]: [phone] [place] [address] [address] [place] ([place] [address]hone: [phone]: [phone] [place] [address] [place]hone: [phone]: [phone] pill box? Yes Pt endorses 95% compliance  We discussed: Benefits of medication synchronization, packaging and delivery as well as enhanced pharmacist oversight with [facility]. Patient decided to: Utilize [facility] for medication synchronization, packaging and delivery  Care Plan and Follow Up Patient Decision:  Patient agrees to Care Plan and Follow-up.  Plan: Telephone follow up appointment with care management team member scheduled for:  3 months  MaJeni SallesPharmD, BCLittlerockharmacist [place] BrFort [place]kn[phone]

## 2021-04-14 ENCOUNTER — Telehealth: Payer: Self-pay

## 2021-04-14 MED ORDER — MAGNESIUM OXIDE -MG SUPPLEMENT 400 (240 MG) MG PO TABS: 1.0000 | ORAL_TABLET | Freq: Every day | ORAL | 1 refills | Status: DC

## 2021-04-14 MED ORDER — ATORVASTATIN CALCIUM 20 MG PO TABS: 20.0000 mg | ORAL_TABLET | Freq: Every day | ORAL | 1 refills | Status: DC

## 2021-04-14 MED ORDER — GABAPENTIN 600 MG PO TABS: 600.0000 mg | ORAL_TABLET | Freq: Two times a day (BID) | ORAL | 3 refills | Status: DC

## 2021-04-14 MED ORDER — ALLOPURINOL 100 MG PO TABS: 100.0000 mg | ORAL_TABLET | Freq: Every day | ORAL | 1 refills | Status: DC

## 2021-04-14 MED ORDER — FAMOTIDINE 20 MG PO TABS: 20.0000 mg | ORAL_TABLET | Freq: Every day | ORAL | 1 refills | Status: DC

## 2021-04-14 MED ORDER — LEVOTHYROXINE SODIUM 150 MCG PO TABS: 150.0000 ug | ORAL_TABLET | Freq: Every day | ORAL | 1 refills | Status: DC

## 2021-04-14 NOTE — Patient Instructions (Signed)
Hi Regina [name],  It was great to get to meet you over the telephone! Below is a summary of some of the topics we discussed.   Please reach out to me if you have any questions or need anything before our follow up!  Best, [name]  [name], PharmD, [place] [facility]   Visit Information   Goals Addressed             This Visit's Progress    Manage My Medicine       Timeframe:  Short-Term Goal Priority:  Medium Start Date:                             Expected End Date:                       Follow Up Date 07/15/2021    - call for medicine refill 2 or 3 days before it runs out - keep a list of all the medicines I take; vitamins and herbals too - use a pillbox to sort medicine - use an alarm clock or phone to remind me to take my medicine    Why is this important?   These steps will help you keep on track with your medicines.   Notes:         Patient Care Plan: CCM Pharmacy Care Plan     Problem Identified: Problem: Hypertension, Hyperlipidemia, Diabetes, Atrial Fibrillation, Heart Failure, Coronary Artery Disease, GERD, Chronic Kidney Disease, Hypothyroidism, Osteoarthritis, and vitamin D deficiency      Long-Range Goal: Patient-Specific Goal   Start Date: 04/13/2021  Expected End Date: 04/13/2022  This Visit's Progress: On track  Priority: High  Note:   Current Barriers:  Unable to independently monitor therapeutic efficacy Unable to achieve control of diabetes   Pharmacist Clinical Goal(s):  Patient will achieve adherence to monitoring guidelines and medication adherence to achieve therapeutic efficacy achieve control of diabetes as evidenced by A1c  through collaboration with PharmD and provider.   Interventions: 1:1 collaboration with [name], [name], MD regarding development and update of comprehensive plan of care as evidenced by provider attestation and co-signature Inter-disciplinary care team  collaboration (see longitudinal plan of care) Comprehensive medication review performed; medication list updated in electronic medical record  Hypertension (BP goal <130/80) -Controlled -Current treatment: Metoprolol tartrate 100 mg 1/2 tablet twice daily -Medications previously tried: none  -Current home readings: 100/55 HR 86, 91/53 HR 58, 96/60 HR 82, 86/58 HR 59, 107/54 HR 92, 131/59 HR 53, 141/57 HR 87, 110/60 HR 61, 98/60 HR 87 -Current dietary habits: limits salt intake -Current exercise habits: trying to walk some -Denies hypotensive/hypertensive symptoms -Educated on Exercise goal of 150 minutes per week; Importance of home blood pressure monitoring; Proper BP monitoring technique; -Counseled to monitor BP at home at least weekly, document, and provide log at future appointments -Counseled on diet and exercise extensively Recommended bringing BP cuff to next appointment to compare readings and make sure her machine is not off. Patient reports she has been feeling out of sorts.  Hyperlipidemia: (LDL goal < 70) -Controlled -Current treatment: Atorvastatin 20 mg 1 tablet daily Fish oil 1000 mg 1 capsule twice daily -Medications previously tried: none  -Current dietary patterns: does not fry foods -Current exercise habits: trying to walk some -Educated on Cholesterol goals;  Importance of limiting foods high in cholesterol; Exercise goal of 150 minutes per  week; -Counseled on diet and exercise extensively Recommended to continue current medication Recommended to stop fish oil as she likely does not need based on triglycerides.  CAD (Goal: prevent heart events) -Controlled -Current treatment  Warfarin 5 mg as directed by coumadin clinic Nitroglycerin 0.4 mg 1 tablet as needed  -Medications previously tried: none  -Recommended checking nitroglycerin expiration date and making sure her on supply has not expired   Diabetes (A1c goal <7%) -Uncontrolled -Current  medications: Farxiga 10 mg 1 tablet daily Toujeo U-300 inject 26 units nightly Humalog U-100 max 15 units daily (sliding scale) -Medications previously tried: n/a  -Current home glucose readings fasting glucose: 79, 109, 90, 105, 105, 79, 79, 105, 93 post prandial glucose: 99, sometimes up to 200, 220 -Denies hypoglycemic/hyperglycemic symptoms -Current meal patterns:  breakfast: peaches, 2 eggs lunch: same as dinner dinner: sometimes potatoes with protein snacks: popcorn, graham crackers drinks: n/a -Current exercise: trying to walk some -Educated on A1c and blood sugar goals; Continuous glucose monitoring; Carbohydrate counting and/or plate method -Counseled to check feet daily and get yearly eye exams -Counseled on diet and exercise extensively Recommended to continue current medication Recommended to bring Dexcom to next endocrinology visit. Patient will likely require adjustments for Humalog sliding scale.    Atrial Fibrillation (Goal: prevent stroke and major bleeding) -Controlled -CHADSVASC: 9 -Current treatment: Rate control: Rate control: metoprolol tartrate 100 mg 1/2 tablet twice daily Anticoagulation: warfarin 5 mg as directed by coumadin clinic -Medications previously tried: n/a -Home BP and HR readings: refer to above  -Counseled on increased risk of stroke due to Afib and benefits of anticoagulation for stroke prevention; avoidance of NSAIDs due to increased bleeding risk with anticoagulants; -Counseled on diet and exercise extensively Recommended to continue current medication Educated on keeping green intake consistent with warfarin use.  Heart Failure (Goal: manage symptoms and prevent exacerbations) -Controlled -Last ejection fraction: 10-15% (Date: 08/05/20) -HF type: Systolic -NYHA Class: III (marked limitation of activity) -AHA HF Stage: C (Heart disease and symptoms present) -Current treatment: metoprolol tartrate 100 mg 1/2 tablet twice  daily Torsemide 20 mg 1 tablet daily -Medications previously tried: n/a  -Current home BP/HR readings: refer to above -Current dietary habits: uses Mrs. Dash and doesn't use salt -Current exercise habits: trying to walk some -Educated on Importance of weighing daily; if you gain more than 3 pounds in one day or 5 pounds in one week, call cardiologist Proper diuretic administration and potassium supplementation Importance of blood pressure control -Counseled on dosing of torsemide and recommended verifying dose with cardiology. Patient's weight has been fluctuating and she is taking 2 daily for the last few days which may explain her lower BP.  Hypothyroidism (Goal: TSH 0.35-4.5) -Controlled -Current treatment  Levothyroxine 150 mcg 1 tablet daily -Medications previously tried: none  -Counseled on importance of timing with this medication and taking on an empty stomach. Patient will set an alarm to remember to take daily.  Restless legs syndrome/neuropathy (Goal: minimize symptoms) -Controlled -Current treatment  Gabapentin 600 mg 1 tablet twice daily -Medications previously tried: none  -Recommended to continue current medication  Gout (Goal: prevent flare ups) -Not ideally controlled -Current treatment  Allopurinol 100 mg 1 tablet daily -Medications previously tried: none  -Recommended uric acid level. Patient's hands flare up all the time and she is unsure if this is gout.  Cardiac amyloidosis  (Goal: minimize symptoms) -Controlled -Current treatment  Tafamidis 61 mg 1 capsule daily -Medications previously tried: none  -Recommended to continue current  medication Assessed patient finances. Patient reports this is expensive but likely wouldn't qualify for assistance.  GERD (Goal: minimize symptoms) -Controlled -Current treatment  Famotidine 20 mg 1 tablet at bedtime as needed -Medications previously tried: pantoprazole  -Recommended to continue current  medication Counseled on differences between pantoprazole and famotidine.  Allergic rhinitis (Goal: minimize symptoms) -Controlled -Current treatment  Cetirizine 10 mg 1 tablet daily Fluticasone 50 mcg/act spray in both nostrils as needed -Medications previously tried: none  -Recommended to continue current medication   Health Maintenance -Vaccine gaps: shingles, tetanus, COVID booster -Current therapy:  Meclizine 25 mg 1 tablet as needed Magnesium oxide 400 mg 1 tablet once daily Systane balance 1 drop in both eyes as needed Vitamin C 1000 mg 1 tablet daily  Lotrisone cream as needed Diclofenac gel 1% as needed Vitamin D 50 mcg daily (2000 units) Aspercreme as needed -Educated on Cost vs benefit of each product must be carefully weighed by individual consumer -Patient is satisfied with current therapy and denies issues -Recommended to continue current medication  Patient Goals/Self-Care Activities Patient will:  - take medications as prescribed focus on medication adherence by setting an alarm to remember levothyroxine. check blood pressure at least weekly, document, and provide at future appointments weigh daily, and contact provider if weight gain of > 3 lbs in one day or > 5 lbs in one week target a minimum of 150 minutes of moderate intensity exercise weekly  Follow Up Plan: Telephone follow up appointment with care management team member scheduled for: 3 months      Regina [name] was given information about Chronic Care Management services today including:  CCM service includes personalized support from designated clinical staff supervised by her physician, including individualized plan of care and coordination with other care providers 24/7 contact phone numbers for assistance for urgent and routine care needs. Standard insurance, coinsurance, copays and deductibles apply for chronic care management only during months in which we provide at least 20 minutes of these  services. Most insurances cover these services at 100%, however patients may be responsible for any copay, coinsurance and/or deductible if applicable. This service may help you avoid the need for more expensive face-to-face services. Only one practitioner may furnish and bill the service in a calendar month. The patient may stop CCM services at any time (effective at the end of the month) by phone call to the office staff.  Patient agreed to services and verbal consent obtained.   The patient verbalized understanding of instructions, educational materials, and care plan provided today and agreed to receive a mailed copy of patient instructions, educational materials, and care plan.  Telephone follow up appointment with pharmacy team member scheduled for:3 months  Regina [name], [facility]

## 2021-04-14 NOTE — Progress Notes (Signed)
[place] [facility] at [facility]  Cardiology Office Note:    Date:  04/15/2021  NAME:  Regina [name]    MRN: [id number] DOB:  09-12-1941   PCP:  [name], [name], MD  Cardiologist:  Evalina Field, MD  Electrophysiologist:  None   Referring MD: [name], [name], MD   Chief Complaint  Patient presents with   Follow-up    History of Present Illness:    Regina [name] is a 80 y.o. female with a hx of hereditary transthyretin cardiac amyloidosis, persistent atrial fibrillation, stroke, diabetes, hypertension, hyperlipidemia who presents for follow-up.  Admitted to [facility] in February for stroke.  She had recurrent left atrial appendage thrombus despite being on Eliquis.  CKD makes Pradaxa not ideal.  She has been placed on Coumadin.  She reports for the last 3 to 4 weeks she is felt more short of breath.  She reports low energy.  She does present with her daughter.  Her blood pressure log shows that her blood pressure is quite low.  She has several values with systolic numbers in the 19J and 80s.  Her weight is up roughly 4 to 5 pounds from her normal weight which is around 213 pounds.  She reports low energy.  She also reports abdominal bloating as well as chest pressure.  She has had several episodes of chest pressure in the past that have improved with diuresis.  Her EKG today shows she is in atrial fibrillation.  Heart rate 65.  We have been unsuccessful with repeated attempts at [place].  She appears slightly volume up to me.  Chest x-ray from the ER showed vascular congestion.  I suspect she just needs more diuresis.  We did discuss that we could reattempt a TEE/cardioversion.  She has had 3 failed attempts.  She has been therapeutic on Coumadin for several months.  We did discuss that we should make some changes to her metoprolol and diuretics and then see her back shortly to readdress this.  I think it may be reasonable to try to get her in normal rhythm  and then pursue rhythm control strategy with amiodarone.  Problem List 1.  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 Persistent Afib -LAA slugde vs thrombus 05/05/2020  -persistent sludge 07/01/2020 -LAA thrombus persistent 08/05/2020 -on warfarin 3. DM -A1c 7.4 4. HLD -T chol 117, HDL 56, LDL 49, triglycerides 54 5. HTN 6. Non-obstructive CAD -LHC 01/19/2015 [facility] [place] -50% mid RCA and 50% LAD -MPI 08/14/2020 -> normal 7. OSA 8. CKD 3/4 9. CVA -11/2020 @ [facility]   Past Medical History: Past Medical History:  Diagnosis Date   Back pain    CHF (congestive heart failure) (HCC)    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 01/2015 Echo: EF 55-60%, Gr 2 DD, mod LVH, mildly dil LA.   Heart disease    Hypertension    Hypothyroidism    Joint pain    Kidney problem    Lower extremity edema    Mini stroke ([place])    Non-obstructive CAD    a. 01/2015 Cardiolite: + inf wall  ischemia, EF 56%;  b. 01/2015 Cath: LM nl, LAD 58m LCX min irregs, RCA dominant, 50-659m  Sleep apnea    Swallowing difficulty    Thyroid disease     Past Surgical History: Past Surgical History:  Procedure Laterality Date   ABDOMINAL HYSTERECTOMY     BUBBLE STUDY  07/01/2020   Procedure: BUBBLE STUDY;  Surgeon: AcElouise MunroeMD;  [facility] Cardi[facility];;   BUBBLE STUDY  08/05/2020   Procedure: BUBBLE STUDY;  Surgeon: O'Geralynn RileMD;  [facility] Cardiovascular;;  done with definity    CARDIAC CATHETERIZATION     ESOPHAGOGASTRODUODENOSCOPY     a. 01/2015 EGD: patent esophagus.   ESOPHAGOGASTRODUODENOSCOPY N/A  01/20/2015   Procedure: ESOPHAGOGASTRODUODENOSCOPY (EGD);  Surgeon: PaCarol AdaMD;  [facility];  Service: Endoscopy;  Laterality: N/A;   HAND SURGERY     JOINT REPLACEMENT     reports history bilateral TKA and right TSA   LEFT HEART CATHETERIZATION WITH CORONARY ANGIOGRAM N/A 01/19/2015   TEE WITHOUT CARDIOVERSION  05/04/2020   TEE WITHOUT CARDIOVERSION N/A 05/05/2020   Procedure: TRANSESOPHAGEAL ECHOCARDIOGRAM (TEE);  Surgeon: HiPixie CasinoMD;  [facility];  Service: Cardiovascular;  Laterality: N/A;   TEE WITHOUT CARDIOVERSION N/A 07/01/2020   Procedure: TRANSESOPHAGEAL ECHOCARDIOGRAM (TEE);  Surgeon: AcElouise MunroeMD;  [facility] [place] [facility]: Cardiovascular;  Laterality: N/A;   TEE WITHOUT CARDIOVERSION N/A 08/05/2020   Procedure: TRANSESOPHAGEAL ECHOCARDIOGRAM (TEE);  Surgeon: O'Geralynn RileMD;  [facility] Cardi[facility];  Laterality: N/A;   THYROIDECTOMY      Current Medications: Current Meds  Medication Sig   Alcohol Swabs (B-D SINGLE USE SWABS REGULAR) PADS Use to test blood sugar up to 3 times daily   allopurinol (ZYLOPRIM) 100 MG tablet Take 1 tablet (100 mg total) by mouth daily.   Ascorbic Acid (VITAMIN C) 1000 MG tablet Take 1,000 mg by mouth daily.   atorvastatin (LIPITOR) 20 MG tablet Take 1 tablet (20 mg total) by mouth daily.   Blood Glucose Monitoring Suppl (ACCU-CHEK GUIDE) w/Device KIT 1 Device by Does not apply route 3 (three) times daily.   cetirizine (ZYRTEC) 10 MG tablet Take 10 mg by mouth daily.   Cholecalciferol (VITAMIN D3) 50 MCG (2000 UT) capsule Take 2,000 Units by mouth daily.   Continuous Blood Gluc Sensor (DEXCOM G6 SENSOR) MISC 1 Device by Does not apply route as directed.   Continuous Blood Gluc Transmit (DEXCOM G6 TRANSMITTER) MISC 1 Device by Does not apply route as directed.   diclofenac Sodium (VOLTAREN) 1 % GEL Apply 1 application topically daily as needed (arthritis  pain/hands).   famotidine (PEPCID) 20 MG tablet Take 1 tablet (20 mg total) by mouth at bedtime.   FARXIGA 10 MG TABS tablet Take 10 mg by mouth daily.   fluticasone (FLONASE) 50 MCG/ACT nasal spray Place 1 spray into both nostrils daily.   gabapentin (NEURONTIN) 600 MG tablet Take 1 tablet (600 mg total) by mouth 2 (two) times daily.   glucose blood (ACCU-CHEK GUIDE) test strip Use to test blood sugar up to 3 times daily   insulin glargine, 1 Unit Dial, (TOUJEO SOLOSTAR) 300 UNIT/ML Solostar Pen Inject 26 Units into the skin daily. Eat a snack with protein nightly before bedtime.   insulin lispro (HUMALOG KWIKPEN) 100 UNIT/ML KwikPen Max daily 15 units   Insulin Pen Needle 32G X 4 MM MISC 1 Device by Does not apply  route in the morning, at noon, in the evening, and at bedtime.   Lancets Misc. (ACCU-CHEK FASTCLIX LANCET) KIT Use to test blood sugar up to 3 times daily   levothyroxine (SYNTHROID) 150 MCG tablet Take 1 tablet (150 mcg total) by mouth daily before breakfast.   magnesium oxide (MAG-OX) 400 (240 Mg) MG tablet Take 1 tablet (400 mg total) by mouth daily.   meclizine (ANTIVERT) 25 MG tablet Take 25 mg by mouth daily as needed for dizziness.    metoprolol succinate (TOPROL XL) 25 MG 24 hr tablet Take 1 tablet (25 mg total) by mouth daily.   nitroGLYCERIN (NITROSTAT) 0.4 MG SL tablet Place 1 tablet (0.4 mg total) under the tongue every 5 (five) minutes as needed for chest pain.   Polyethylene Glycol 3350 (MIRALAX PO) Take by mouth daily as needed.   Propylene Glycol (SYSTANE BALANCE OP) Place 1 drop into both eyes daily as needed (for dry eyes).   Tafamidis 61 MG CAPS Take 61 mg by mouth daily.   tiZANidine (ZANAFLEX) 4 MG tablet Take 0.5-1 tablets (2-4 mg total) by mouth at bedtime as needed for muscle spasms.   torsemide (DEMADEX) 20 MG tablet Take 2 tablets (40 mg total) by mouth daily. Ok to take extra dose if wt gain of 1 lb in 1 day or 5 lbs in 7 days   trolamine salicylate  (ASPERCREME) 10 % cream Apply 1 application topically as needed for muscle pain.   warfarin (COUMADIN) 5 MG tablet Take 1 tablet (5 mg total) by mouth daily. (Patient taking differently: Take 2.5 mg by mouth See admin instructions. Take 2.5 mg Mon., Wed., Fri. Take 5 mg all the other days at bedtime)   [DISCONTINUED] metoprolol tartrate (LOPRESSOR) 100 MG tablet Take 0.5 tablets (50 mg total) by mouth 2 (two) times daily.     Allergies:    Ace inhibitors, Amlodipine, Atenolol, Avandia [rosiglitazone], Darvon [propoxyphene], Erythromycin, Hydralazine, Hydrocodone, Levofloxacin, Morphine and related, Percocet [oxycodone-acetaminophen], Spironolactone, and Tramadol   Social History: Social History   Socioeconomic History   Marital status: Widowed    Spouse name: Not on file   Number of children: Not on file   Years of education: Not on file   Highest education level: Not on file  Occupational History   Occupation: Retired  Tobacco Use   Smoking status: Never   Smokeless tobacco: Never  Vaping Use   Vaping Use: Never used  Substance and Sexual Activity   Alcohol use: No   Drug use: Not on file   Sexual activity: Not Currently  Other Topics Concern   Not on file  Social History Narrative   Lives with daughter   Social Determinants of Health   Financial Resource Strain: Low Risk    Difficulty of Paying Living Expenses: Not hard at all  Food Insecurity: No Food Insecurity   Worried About Charity fundraiser in the Last Year: Never true   Arboriculturist in the Last Year: Never true  Transportation Needs: No Transportation Needs   Lack of Transportation (Medical): No   Lack of Transportation (Non-Medical): No  Physical Activity: Inactive   Days of Exercise per Week: 0 days   Minutes of Exercise per Session: 0 min  Stress: No Stress Concern Present   Feeling of Stress : Not at all  Social Connections: Moderately Isolated   Frequency of Communication with Friends and Family:  More than three times a week   Frequency of Social Gatherings  with Friends and Family: Twice a week   Attends Religious Services: More than 4 times per year   Active Member of Clubs or Organizations: No   Attends Archivist Meetings: Never   Marital Status: Widowed     Family History: The patient's family history includes Alcoholism in her father; Cancer in her father; Heart disease in her mother; Hypertension in her mother.  ROS:   All other ROS reviewed and negative. Pertinent positives noted in the HPI.    EKGs/Labs/Other Studies Reviewed:   The following studies were personally reviewed by me today:  EKG:  EKG is ordered today.  The ekg ordered today demonstrates atrial fibrillation, heart rate 65, no acute ischemic changes or evidence of infarction, and was personally reviewed by me.   Recent Labs: 04/25/2020: ALT 18 05/28/2020: BNP 630.4 06/08/2020: TSH 3.24 02/15/2021: Magnesium 2.2 04/02/2021: BUN 39; Creatinine, Ser 2.04; Hemoglobin 12.0; Platelets 249; Potassium 3.9; Sodium 138   Recent Lipid Panel    Component Value Date/Time   CHOL 117 03/24/2020 1428   TRIG 54 03/24/2020 1428   HDL 56 03/24/2020 1428   CHOLHDL 2 05/22/2019 1631   VLDL 23.8 05/22/2019 1631   LDLCALC 49 03/24/2020 1428    Physical Exam:   VS:  BP 136/72   Pulse 65   Ht 5' 2"  (1.575 m)   Wt 218 lb 9.6 oz (99.2 kg)   BMI 39.98 kg/m    Wt Readings from Last 3 Encounters:  04/15/21 218 lb 9.6 oz (99.2 kg)  02/22/21 219 lb (99.3 kg)  02/15/21 204 lb (92.5 kg)    General: Well nourished, well developed, in no acute distress Head: Atraumatic, normal size  Eyes: PEERLA, EOMI  Neck: Supple, JVD 7 to 8 cm of water Endocrine: No thryomegaly Cardiac: Normal S1, S2; irregular rhythm, no murmurs rubs or gallops Lungs: Clear to auscultation bilaterally, no wheezing, rhonchi or rales  Abd: Soft, nontender, no hepatomegaly  Ext: Trace edema Musculoskeletal: No deformities, BUE and BLE strength  normal and equal Skin: Warm and dry, no rashes   Neuro: Alert and oriented to person, place, time, and situation, CNII-XII grossly intact, no focal deficits  Psych: Normal mood and affect   ASSESSMENT:   Regina [name] is a 80 y.o. female who presents for the following: 1. Hereditary cardiac amyloidosis ([place])   2. Chronic combined systolic and diastolic CHF (congestive heart failure) (HCC)   3. Persistent atrial fibrillation ([place])   4. Long term (current) use of anticoagulants   5. Cerebrovascular accident (CVA), unspecified mechanism ([place])     PLAN:   1. Hereditary cardiac amyloidosis ([place]) 2. Chronic combined systolic and diastolic CHF (congestive heart failure) (HCC) -hATTR cardiac amyloidosis.  Genetically positive.  Negative SPEP/UPEP.  Positive PYP. -Her course is complicated by CKD stage III on 4. -She also has atrial fibrillation that has resulted in a drop in her ejection fraction.  Most recent EF on cardiac MRI was 30%. -We have had several attempts at TEE/cardioversion however due to left atrial appendage thrombus she has been unable to go cardioversion. -She was on Eliquis and failed this.  We did settle on Coumadin.  Given CKD Pradaxa not ideal. -I believe her symptoms over the past 3 to 4 weeks are related to volume overload.  She is roughly 4 to 5 pounds up.  I would recommend she increase her torsemide to 40 mg twice daily for 3 days and then resume 40 mg daily.  I would  like for her to come to the office next week on Monday or Tuesday for BMP. -She also describes lightheaded and dizziness.  I suspect this is progression of disease.  Her BP is too low at home.  Her A. fib is rate controlled with heart rate in the 60s.  I would like to back off on her metoprolol.  She is on metoprolol tartrate 50 twice daily.  I would like to simply put her on 25 mg daily. -I would like to repeat her echocardiogram.  We need to determine what her LV function is doing.  Unfortunately given  her cardiac amyloidosis and significant CKD she cannot be on guideline directed medical therapy.  CKD precludes ACE/ARB/Arni/MRA.  Blood pressure will not tolerate it either. -We have readdressed her need to get back in sinus rhythm.  I think it is reasonable to see her back in 6 weeks to determine if the above changes make any difference.  We can then readdress pursuing a TEE/cardioversion.  She has done well for over 1 year.  I am reluctant to make major changes as her condition is rather difficult to manage.  She has done well.  3. Persistent atrial fibrillation ([place]) 4. Long term (current) use of anticoagulants -Rate controlled on metoprolol.  Reduce metoprolol to 25 mg of succinate daily.  Continue Coumadin.  She had persistent left atrial appendage thrombus on Eliquis.  We switched to Coumadin as we would not pursue a similar DOAC.  Pradaxa likely not the best medication given CKD.  Coumadin is the best rec at this point. -I will bring her back in 6 weeks and we will see if the above changes make any difference.  We will then discuss attempt at repeat TEE/cardioversion.  5. Cerebrovascular accident (CVA), unspecified mechanism ([place]) -She has had strokes related to her cardiac amyloidosis despite being on Coumadin.  This is just the course of her disease.  It is rather difficult to control once he gets into the advanced stages.   Disposition: Return in about 6 weeks (around 05/27/2021).  Medication Adjustments/Labs and Tests Ordered: Current medicines are reviewed at length with the patient today.  Concerns regarding medicines are outlined above.  Orders Placed This Encounter  Procedures   Basic metabolic panel   EKG 83-JASN   ECHOCARDIOGRAM COMPLETE    Meds ordered this encounter  Medications   metoprolol succinate (TOPROL XL) 25 MG 24 hr tablet    Sig: Take 1 tablet (25 mg total) by mouth daily.    Dispense:  90 tablet    Refill:  1     Patient Instructions  Medication  Instructions:  Take Torsemide 40 mg twice daily for 3 days, then decrease back to once daily  Stop Metoprolol Tartrate  Start Metoprolol Succinate 25 mg daily   *If you need a refill on your cardiac medications before your next appointment, please call your pharmacy*   Lab Work: BMET next week (come to the [place] office, no appointment needed)   If you have labs (blood work) drawn today and your tests are completely normal, you will receive your results only by: [place] (if you have MyChart) OR A paper copy in the mail If you have any lab test that is abnormal or we need to change your treatment, we will call you to review the results.   Testing/Procedures: Echocardiogram (before appointment in 6 weeks) - Your physician has requested that you have an echocardiogram. Echocardiography is a painless test that uses sound  waves to create images of your heart. It provides your doctor with information about the size and shape of your heart and how well your heart's chambers and valves are working. This procedure takes approximately one hour. There are no restrictions for this procedure. This will be performed at our [facility] location - [address].    Follow-Up: At [facility], you and your health needs are our priority.  As part of our continuing mission to provide you with exceptional heart care, we have created designated Provider Care Teams.  These Care Teams include your primary Cardiologist (physician) and Advanced Practice Providers (APPs -  Physician Assistants and Nurse Practitioners) who all work together to provide you with the care you need, when you need it.  We recommend signing up for the patient portal called "MyChart".  Sign up information is provided on this After Visit Summary.  MyChart is used to connect with patients for Virtual Visits (Telemedicine).  Patients are able to view lab/test results, encounter notes, upcoming appointments, etc.  Non-urgent  messages can be sent to your provider as well.   To learn more about what you can do with MyChart, go to [email or web address].    Your next appointment:   6 week(s)  The format for your next appointment:   In Person  Provider:   [name], MD      Time Spent with Patient: I have spent a total of 35 minutes with patient reviewing hospital notes, telemetry, EKGs, labs and examining the patient as well as establishing an assessment and plan that was discussed with the patient.  > 50% of time was spent in direct patient care.  Signed, Addison Naegeli. Audie Box, MD, [place]  [address] [place], [place] [address] [phone]  04/15/2021 8:40 AM

## 2021-04-14 NOTE — Telephone Encounter (Signed)
-----   Message from Viona Gilmore, [facility] sent at 04/14/2021  2:19 PM EDT ----- Regarding: Refills Hi,  Ms. Gunnells is switching to [employer]. Can you please send refills of the following to them: -magnesium oxide -allopurinol -atorvastatin -famotidine -gabapentin -levothyroxine  Thank you for your help! [name]

## 2021-04-15 ENCOUNTER — Other Ambulatory Visit: Payer: Self-pay

## 2021-04-15 ENCOUNTER — Ambulatory Visit (HOSPITAL_BASED_OUTPATIENT_CLINIC_OR_DEPARTMENT_OTHER): Payer: Medicare Other | Admitting: Cardiovascular Disease

## 2021-04-15 ENCOUNTER — Encounter (HOSPITAL_BASED_OUTPATIENT_CLINIC_OR_DEPARTMENT_OTHER): Payer: Self-pay | Admitting: Cardiovascular Disease

## 2021-04-15 VITALS — BP 136/72 | HR 65 | Ht 62.0 in | Wt 218.6 lb

## 2021-04-15 DIAGNOSIS — I639 Cerebral infarction, unspecified: Secondary | ICD-10-CM

## 2021-04-15 DIAGNOSIS — I5042 Chronic combined systolic (congestive) and diastolic (congestive) heart failure: Secondary | ICD-10-CM

## 2021-04-15 DIAGNOSIS — E854 Organ-limited amyloidosis: Secondary | ICD-10-CM | POA: Diagnosis not present

## 2021-04-15 DIAGNOSIS — I4819 Other persistent atrial fibrillation: Secondary | ICD-10-CM

## 2021-04-15 DIAGNOSIS — Z7901 Long term (current) use of anticoagulants: Secondary | ICD-10-CM | POA: Diagnosis not present

## 2021-04-15 DIAGNOSIS — I43 Cardiomyopathy in diseases classified elsewhere: Secondary | ICD-10-CM | POA: Diagnosis not present

## 2021-04-15 MED ORDER — METOPROLOL SUCCINATE ER 25 MG PO TB24: 25.0000 mg | ORAL_TABLET | Freq: Every day | ORAL | 1 refills | Status: DC

## 2021-04-15 NOTE — Patient Instructions (Signed)
Medication Instructions:  Take Torsemide 40 mg twice daily for 3 days, then decrease back to once daily  Stop Metoprolol Tartrate  Start Metoprolol Succinate 25 mg daily   *If you need a refill on your cardiac medications before your next appointment, please call your pharmacy*   Lab Work: BMET next week (come to the [place] office, no appointment needed)   If you have labs (blood work) drawn today and your tests are completely normal, you will receive your results only by: [place] (if you have MyChart) OR A paper copy in the mail If you have any lab test that is abnormal or we need to change your treatment, we will call you to review the results.   Testing/Procedures: Echocardiogram (before appointment in 6 weeks) - Your physician has requested that you have an echocardiogram. Echocardiography is a painless test that uses sound waves to create images of your heart. It provides your doctor with information about the size and shape of your heart and how well your heart's chambers and valves are working. This procedure takes approximately one hour. There are no restrictions for this procedure. This will be performed at our [facility] location - [address].    Follow-Up: At [facility], you and your health needs are our priority.  As part of our continuing mission to provide you with exceptional heart care, we have created designated Provider Care Teams.  These Care Teams include your primary Cardiologist (physician) and Advanced Practice Providers (APPs -  Physician Assistants and Nurse Practitioners) who all work together to provide you with the care you need, when you need it.  We recommend signing up for the patient portal called "MyChart".  Sign up information is provided on this After Visit Summary.  MyChart is used to connect with patients for Virtual Visits (Telemedicine).  Patients are able to view lab/test results, encounter notes, upcoming appointments, etc.   Non-urgent messages can be sent to your provider as well.   To learn more about what you can do with MyChart, go to [email or web address].    Your next appointment:   6 week(s)  The format for your next appointment:   In Person  Provider:   [name], MD

## 2021-04-16 ENCOUNTER — Encounter: Payer: Self-pay | Admitting: Dietician

## 2021-04-16 ENCOUNTER — Telehealth: Payer: Self-pay | Admitting: Pharmacist

## 2021-04-16 ENCOUNTER — Encounter: Payer: Medicare Other | Attending: Internal Medicine | Admitting: Dietician

## 2021-04-16 DIAGNOSIS — E1122 Type 2 diabetes mellitus with diabetic chronic kidney disease: Secondary | ICD-10-CM | POA: Diagnosis not present

## 2021-04-16 DIAGNOSIS — Z794 Long term (current) use of insulin: Secondary | ICD-10-CM | POA: Insufficient documentation

## 2021-04-16 DIAGNOSIS — N1831 Chronic kidney disease, stage 3a: Secondary | ICD-10-CM | POA: Diagnosis not present

## 2021-04-16 NOTE — Chronic Care Management (AMB) (Signed)
Chronic Care Management Pharmacy Assistant   Name: Regina [name]  MRN: [id number] DOB: Apr 30, 1941  Spoke with Belenda Cruise at Dr. Nathaneil Canary office at [facility] per [name] to request prescriptions be sent over to [facility] for patients Toujeo, Humalog, test strips, lancets and pen needles. Katherine sent message back to Dr. Nathaneil Canary nurse.  Spoke with Glenard Haring the receptionist at Dr. Heather Roberts office and left message to have prescriptions for patients Torsemide, Warfarin, Metoprolol and Farxiga sent to [facility]. Angel sent a message to nurse to have sent over. Glenard Haring thanked me for my call.    Medications: Outpatient Encounter Medications as of 04/16/2021  Medication Sig Note   Alcohol Swabs (B-D SINGLE USE SWABS REGULAR) PADS Use to test blood sugar up to 3 times daily    allopurinol (ZYLOPRIM) 100 MG tablet Take 1 tablet (100 mg total) by mouth daily.    Ascorbic Acid (VITAMIN C) 1000 MG tablet Take 1,000 mg by mouth daily.    atorvastatin (LIPITOR) 20 MG tablet Take 1 tablet (20 mg total) by mouth daily.    Blood Glucose Monitoring Suppl (ACCU-CHEK GUIDE) w/Device KIT 1 Device by Does not apply route 3 (three) times daily.    cetirizine (ZYRTEC) 10 MG tablet Take 10 mg by mouth daily.    Cholecalciferol (VITAMIN D3) 50 MCG (2000 UT) capsule Take 2,000 Units by mouth daily.    Continuous Blood Gluc Sensor (DEXCOM G6 SENSOR) MISC 1 Device by Does not apply route as directed.    Continuous Blood Gluc Transmit (DEXCOM G6 TRANSMITTER) MISC 1 Device by Does not apply route as directed.    diclofenac Sodium (VOLTAREN) 1 % GEL Apply 1 application topically daily as needed (arthritis pain/hands).    famotidine (PEPCID) 20 MG tablet Take 1 tablet (20 mg total) by mouth at bedtime.    FARXIGA 10 MG TABS tablet Take 10 mg by mouth daily.    fluticasone (FLONASE) 50 MCG/ACT nasal spray Place 1 spray into both nostrils daily.    gabapentin (NEURONTIN) 600 MG  tablet Take 1 tablet (600 mg total) by mouth 2 (two) times daily.    glucose blood (ACCU-CHEK GUIDE) test strip Use to test blood sugar up to 3 times daily    insulin glargine, 1 Unit Dial, (TOUJEO SOLOSTAR) 300 UNIT/ML Solostar Pen Inject 26 Units into the skin daily. Eat a snack with protein nightly before bedtime.    insulin lispro (HUMALOG KWIKPEN) 100 UNIT/ML KwikPen Max daily 15 units 01/08/2021: Using when needed   Insulin Pen Needle 32G X 4 MM MISC 1 Device by Does not apply route in the morning, at noon, in the evening, and at bedtime.    Lancets Misc. (ACCU-CHEK FASTCLIX LANCET) KIT Use to test blood sugar up to 3 times daily    levothyroxine (SYNTHROID) 150 MCG tablet Take 1 tablet (150 mcg total) by mouth daily before breakfast.    magnesium oxide (MAG-OX) 400 (240 Mg) MG tablet Take 1 tablet (400 mg total) by mouth daily.    meclizine (ANTIVERT) 25 MG tablet Take 25 mg by mouth daily as needed for dizziness.  01/08/2021: prn   metoprolol succinate (TOPROL XL) 25 MG 24 hr tablet Take 1 tablet (25 mg total) by mouth daily.    nitroGLYCERIN (NITROSTAT) 0.4 MG SL tablet Place 1 tablet (0.4 mg total) under the tongue every 5 (five) minutes as needed for chest pain.    Polyethylene Glycol 3350 (MIRALAX PO) Take by mouth daily as needed.  Propylene Glycol (SYSTANE BALANCE OP) Place 1 drop into both eyes daily as needed (for dry eyes).    Tafamidis 61 MG CAPS Take 61 mg by mouth daily.    tiZANidine (ZANAFLEX) 4 MG tablet Take 0.5-1 tablets (2-4 mg total) by mouth at bedtime as needed for muscle spasms.    torsemide (DEMADEX) 20 MG tablet Take 2 tablets (40 mg total) by mouth daily. Ok to take extra dose if wt gain of 1 lb in 1 day or 5 lbs in 7 days    trolamine salicylate (ASPERCREME) 10 % cream Apply 1 application topically as needed for muscle pain.    warfarin (COUMADIN) 5 MG tablet Take 1 tablet (5 mg total) by mouth daily. (Patient taking differently: Take 2.5 mg by mouth See admin  instructions. Take 2.5 mg Mon., Wed., Fri. Take 5 mg all the other days at bedtime) 01/08/2021: 74m only on Sunday, other days 2.538m  No facility-administered encounter medications on file as of 04/16/2021.   Star Rating Drugs:  Atorvastatin 2056m [phone]

## 2021-04-16 NOTE — Patient Instructions (Signed)
Continue your low sodium eating and mindfulness with choices.

## 2021-04-16 NOTE — Progress Notes (Signed)
Diabetes Self-Management Education  Visit Type: Follow-up  Appt. Start Time: 1055 Appt. End Time: 3235  04/16/2021  [name], identified by name and date of birth, is a 80 y.o. female with a diagnosis of Diabetes:  .   ASSESSMENT Patient is here today with her oldest daughter, Regina [name].  She was last seen by this RD 02/22/2021.  She continues to cook for herself and uses no added salt or high sodium foods.  She avoids bread and states that she is hungry at times.  She is eating quite lightly.  Noted that her cardiologist does not wish for patient to lose weight. She is nervous to eat any green vegetables as she is on coumadin and does not wish to increase her INR. Fasting blood glucose is usually 79-105 Post meal glucose is >200.  History includes Type 2 Diabetes, CKD stage 4, HTN, CVA, chronic anticoagulation, afib, neuropathy, retinopathy, OSA on C-pap, cardiac amloydosis A1C 7.4% 01/08/21 no change from 09/09/2020 eGFR 20, BUN 57, Creatinine 2.24, Potassium 4.4  (GFR has decreased from 26 12/2020 Medications include Farxiga, Toujeo 26 units q HS, Humalog (sliding scale for correction, coumadin DEXCOM G6   Weigh hx: 219 lbs today  Her cardiologist does not want her to lose more weight per granddaughter. 246 lbs 11/2019 and lost at [facility] 135 lbs lowest adult weight prior to children   Patient lives with her son.  She has increased family support who attend MD appointment with her and other things as needed. They shop together and patient does most of the cooking.  Her granddaughter Regina [name] follows a plant based diet.  She avoids added salt.  She reads labels to determine the sodium content of food. Does not feel balanced enough to walk alone.  Weight 218 lb (98.9 kg). Body mass index is 39.87 kg/m.   Diabetes Self-Management Education - 04/16/21 1845       Visit Information   Visit Type Follow-up      Pre-Education Assessment   Patient understands  the diabetes disease and treatment process. Demonstrates understanding / competency    Patient understands incorporating nutritional management into lifestyle. Needs Review    Patient undertands incorporating physical activity into lifestyle. Demonstrates understanding / competency    Patient understands using medications safely. Needs Review    Patient understands monitoring blood glucose, interpreting and using results Demonstrates understanding / competency    Patient understands prevention, detection, and treatment of acute complications. Demonstrates understanding / competency    Patient understands prevention, detection, and treatment of chronic complications. Demonstrates understanding / competency    Patient understands how to develop strategies to address psychosocial issues. Demonstrates understanding / competency    Patient understands how to develop strategies to promote health/change behavior. Needs Review      Complications   How often do you check your blood sugar? 1-2 times/day    Fasting Blood glucose range (mg/dL) 70-129    Postprandial Blood glucose range (mg/dL) >200      Dietary Intake   Breakfast 2 eggs, 1/4 cup oatmeal or cream of wheat, fruit    Snack (morning) none    Lunch celery with peanut butter and fruit    Dinner baked chicken or por or fish or ox tails, rice or occasional beans or sweet potato, green beans, mixed vetables    Beverage(s) water, occasional unsweetened tea, occasional diet sprite      Patient Education   Nutrition management  Meal options for control  of blood glucose level and chronic complications.    Medications Reviewed patients medication for diabetes, action, purpose, timing of dose and side effects.      Individualized Goals (developed by patient)   Nutrition General guidelines for healthy choices and portions discussed    Medications take my medication as prescribed    Monitoring  test my blood glucose as discussed    Reducing Risk  examine blood glucose patterns;increase portions of healthy fats;do foot checks daily    Health Coping discuss diabetes with (comment)   MD, RD, family     Patient Self-Evaluation of Goals - Patient rates self as meeting previously set goals (% of time)   Nutrition >75%    Physical Activity Not Applicable    Medications >75%    Monitoring >75%    Problem Solving >75%    Reducing Risk >75%    Health Coping >75%      Post-Education Assessment   Patient understands the diabetes disease and treatment process. Demonstrates understanding / competency    Patient understands incorporating nutritional management into lifestyle. Needs Review    Patient undertands incorporating physical activity into lifestyle. Demonstrates understanding / competency    Patient understands using medications safely. Demonstrates understanding / competency    Patient understands monitoring blood glucose, interpreting and using results Demonstrates understanding / competency    Patient understands prevention, detection, and treatment of acute complications. Demonstrates understanding / competency    Patient understands prevention, detection, and treatment of chronic complications. Demonstrates understanding / competency    Patient understands how to develop strategies to address psychosocial issues. Demonstrates understanding / competency    Patient understands how to develop strategies to promote health/change behavior. Needs Review      Outcomes   Expected Outcomes Demonstrated interest in learning. Expect positive outcomes    Future DMSE 2 months    Program Status Not Completed             Individualized Plan for Diabetes Self-Management Training:   Learning Objective:  Patient will have a greater understanding of diabetes self-management. Patient education plan is to attend individual and/or group sessions per assessed needs and concerns.   Plan:   Patient Instructions  Continue your low sodium eating  and mindfulness with choices.  Expected Outcomes:  Demonstrated interest in learning. Expect positive outcomes  Education material provided: renal placemat, meal plan card  If problems or questions, patient to contact team via:  Phone  Future DSME appointment: 2 months

## 2021-04-20 ENCOUNTER — Telehealth: Payer: Self-pay | Admitting: Internal Medicine

## 2021-04-20 NOTE — Telephone Encounter (Signed)
MEDICATION: Toujeo, Humalog, test strips, lancets, pen needles  PHARMACY:  [facility]  HAS THE PATIENT CONTACTED THEIR PHARMACY?  yes  IS THIS A 90 DAY SUPPLY : yes  IS PATIENT OUT OF MEDICATION:   IF NOT; HOW MUCH IS LEFT:   LAST APPOINTMENT DATE: @5 /13/2022  NEXT APPOINTMENT DATE:@7 /29/2022

## 2021-04-21 ENCOUNTER — Telehealth: Payer: Self-pay | Admitting: Cardiovascular Disease

## 2021-04-21 ENCOUNTER — Other Ambulatory Visit: Payer: Self-pay

## 2021-04-21 ENCOUNTER — Ambulatory Visit (HOSPITAL_COMMUNITY): Payer: Medicare Other | Attending: Cardiovascular Disease

## 2021-04-21 DIAGNOSIS — I5042 Chronic combined systolic (congestive) and diastolic (congestive) heart failure: Secondary | ICD-10-CM | POA: Insufficient documentation

## 2021-04-21 DIAGNOSIS — I4819 Other persistent atrial fibrillation: Secondary | ICD-10-CM | POA: Diagnosis not present

## 2021-04-21 LAB — ECHOCARDIOGRAM COMPLETE: S' Lateral: 3.1 cm

## 2021-04-21 MED ORDER — ACCU-CHEK GUIDE VI STRP: ORAL_STRIP | 3 refills | Status: DC

## 2021-04-21 MED ORDER — ACCU-CHEK FASTCLIX LANCET KIT: PACK | 1 refills | Status: DC

## 2021-04-21 MED ORDER — TOUJEO SOLOSTAR 300 UNIT/ML ~~LOC~~ SOPN: 26.0000 [IU] | PEN_INJECTOR | Freq: Every day | SUBCUTANEOUS | 6 refills | Status: DC

## 2021-04-21 MED ORDER — INSULIN LISPRO (1 UNIT DIAL) 100 UNIT/ML (KWIKPEN): PEN_INJECTOR | SUBCUTANEOUS | 11 refills | Status: DC

## 2021-04-21 NOTE — Telephone Encounter (Signed)
   *  STAT* If patient is at the pharmacy, call can be transferred to refill team.   1. Which medications need to be refilled? (please list name of each medication and dose if known)   FARXIGA 10 MG TABS tablet  metoprolol succinate (TOPROL XL) 25 MG 24 hr tablet torsemide (DEMADEX) 20 MG tablet warfarin (COUMADIN) 5 MG tablet  2. Which pharmacy/location (including street and city if local pharmacy) is medication to be sent to? [facility] - [place], [place] - [place] [address]  3. Do they need a 30 day or 90 day supply? 90 days   Pt switch pharmacy, send all to [place], [place] - [address]

## 2021-04-21 NOTE — Telephone Encounter (Signed)
Sent in refill to pharmacy.

## 2021-04-22 ENCOUNTER — Ambulatory Visit (INDEPENDENT_AMBULATORY_CARE_PROVIDER_SITE_OTHER): Payer: Medicare Other

## 2021-04-22 DIAGNOSIS — I4819 Other persistent atrial fibrillation: Secondary | ICD-10-CM

## 2021-04-22 DIAGNOSIS — Z7901 Long term (current) use of anticoagulants: Secondary | ICD-10-CM

## 2021-04-22 LAB — POCT INR: INR: 2.4 (ref 2.0–3.0)

## 2021-04-22 NOTE — Patient Instructions (Signed)
Continue taking 1/2 tablet daily except 1 tablet each Sunday.  Repeat INR in 6 weeks

## 2021-04-23 MED ORDER — FARXIGA 10 MG PO TABS: 10.0000 mg | ORAL_TABLET | Freq: Every day | ORAL | 3 refills | Status: DC

## 2021-04-23 MED ORDER — WARFARIN SODIUM 5 MG PO TABS: ORAL_TABLET | ORAL | 0 refills | Status: DC

## 2021-04-23 MED ORDER — METOPROLOL SUCCINATE ER 25 MG PO TB24: 25.0000 mg | ORAL_TABLET | Freq: Every day | ORAL | 3 refills | Status: DC

## 2021-04-23 MED ORDER — TORSEMIDE 20 MG PO TABS: 40.0000 mg | ORAL_TABLET | Freq: Every day | ORAL | 3 refills | Status: DC

## 2021-04-23 NOTE — Addendum Note (Signed)
Addended by: Allean Found on: 04/23/2021 09:15 AM   Modules accepted: Orders

## 2021-04-27 ENCOUNTER — Telehealth: Payer: Self-pay | Admitting: Family Medicine

## 2021-04-27 NOTE — Telephone Encounter (Signed)
Pt call and want you to give her a call back.

## 2021-04-27 NOTE — Telephone Encounter (Signed)
Called patient back and left a voicemail for her to return the call.

## 2021-05-10 DIAGNOSIS — I5042 Chronic combined systolic (congestive) and diastolic (congestive) heart failure: Secondary | ICD-10-CM | POA: Diagnosis not present

## 2021-05-10 LAB — BASIC METABOLIC PANEL
BUN/Creatinine Ratio: 22 (ref 12–28)
BUN: 51 mg/dL — ABNORMAL HIGH (ref 8–27)
CO2: 29 mmol/L (ref 20–29)
Calcium: 10.4 mg/dL — ABNORMAL HIGH (ref 8.7–10.3)
Chloride: 100 mmol/L (ref 96–106)
Creatinine, Ser: 2.3 mg/dL — ABNORMAL HIGH (ref 0.57–1.00)
Glucose: 80 mg/dL (ref 65–99)
Potassium: 4.2 mmol/L (ref 3.5–5.2)
Sodium: 145 mmol/L — ABNORMAL HIGH (ref 134–144)
eGFR: 21 mL/min/{1.73_m2} — ABNORMAL LOW (ref >59)

## 2021-05-12 DIAGNOSIS — Z794 Long term (current) use of insulin: Secondary | ICD-10-CM | POA: Diagnosis not present

## 2021-05-12 DIAGNOSIS — E119 Type 2 diabetes mellitus without complications: Secondary | ICD-10-CM | POA: Diagnosis not present

## 2021-05-12 NOTE — Telephone Encounter (Signed)
Called patient to follow up on a question from the pharmacy. Left a voicemail and requested for the patient to call me back.

## 2021-05-13 ENCOUNTER — Telehealth: Payer: Self-pay | Admitting: Pharmacist

## 2021-05-13 NOTE — Chronic Care Management (AMB) (Signed)
Chronic Care Management Pharmacy Assistant   Name: Regina [name]  MRN: [id number] DOB: 1940/12/19   Reason for Encounter: Disease State/ Hypertension Assessment Call.   Conditions to be addressed/monitored: HTN   Recent office visits:  None.   Recent consult visits:  04/22/21 Frederik Schmidt RN (Cardiology) - seen for anticoagulation office visit and PT/INR check.   Hospital visits:  None in previous 6 months  Medications: Outpatient Encounter Medications as of 05/13/2021  Medication Sig Note   Alcohol Swabs (B-D SINGLE USE SWABS REGULAR) PADS Use to test blood sugar up to 3 times daily    allopurinol (ZYLOPRIM) 100 MG tablet Take 1 tablet (100 mg total) by mouth daily.    Ascorbic Acid (VITAMIN C) 1000 MG tablet Take 1,000 mg by mouth daily.    atorvastatin (LIPITOR) 20 MG tablet Take 1 tablet (20 mg total) by mouth daily.    Blood Glucose Monitoring Suppl (ACCU-CHEK GUIDE) w/Device KIT 1 Device by Does not apply route 3 (three) times daily.    cetirizine (ZYRTEC) 10 MG tablet Take 10 mg by mouth daily.    Cholecalciferol (VITAMIN D3) 50 MCG (2000 UT) capsule Take 2,000 Units by mouth daily.    Continuous Blood Gluc Sensor (DEXCOM G6 SENSOR) MISC 1 Device by Does not apply route as directed.    Continuous Blood Gluc Transmit (DEXCOM G6 TRANSMITTER) MISC 1 Device by Does not apply route as directed.    diclofenac Sodium (VOLTAREN) 1 % GEL Apply 1 application topically daily as needed (arthritis pain/hands).    famotidine (PEPCID) 20 MG tablet Take 1 tablet (20 mg total) by mouth at bedtime.    FARXIGA 10 MG TABS tablet Take 1 tablet (10 mg total) by mouth daily.    fluticasone (FLONASE) 50 MCG/ACT nasal spray Place 1 spray into both nostrils daily.    gabapentin (NEURONTIN) 600 MG tablet Take 1 tablet (600 mg total) by mouth 2 (two) times daily.    glucose blood (ACCU-CHEK GUIDE) test strip Use to test blood sugar up to 3 times daily    insulin glargine, 1 Unit Dial, (TOUJEO  SOLOSTAR) 300 UNIT/ML Solostar Pen Inject 26 Units into the skin daily. Eat a snack with protein nightly before bedtime.    insulin lispro (HUMALOG KWIKPEN) 100 UNIT/ML KwikPen Max daily 15 units    Insulin Pen Needle 32G X 4 MM MISC 1 Device by Does not apply route in the morning, at noon, in the evening, and at bedtime.    Lancets Misc. (ACCU-CHEK FASTCLIX LANCET) KIT Use to test blood sugar up to 3 times daily    levothyroxine (SYNTHROID) 150 MCG tablet Take 1 tablet (150 mcg total) by mouth daily before breakfast.    magnesium oxide (MAG-OX) 400 (240 Mg) MG tablet Take 1 tablet (400 mg total) by mouth daily.    meclizine (ANTIVERT) 25 MG tablet Take 25 mg by mouth daily as needed for dizziness.  01/08/2021: prn   metoprolol succinate (TOPROL XL) 25 MG 24 hr tablet Take 1 tablet (25 mg total) by mouth daily.    nitroGLYCERIN (NITROSTAT) 0.4 MG SL tablet Place 1 tablet (0.4 mg total) under the tongue every 5 (five) minutes as needed for chest pain.    Polyethylene Glycol 3350 (MIRALAX PO) Take by mouth daily as needed.    Propylene Glycol (SYSTANE BALANCE OP) Place 1 drop into both eyes daily as needed (for dry eyes).    Tafamidis 61 MG CAPS Take 61 mg by mouth  daily.    tiZANidine (ZANAFLEX) 4 MG tablet Take 0.5-1 tablets (2-4 mg total) by mouth at bedtime as needed for muscle spasms.    torsemide (DEMADEX) 20 MG tablet Take 2 tablets (40 mg total) by mouth daily. Ok to take extra dose if wt gain of 1 lb in 1 day or 5 lbs in 7 days    trolamine salicylate (ASPERCREME) 10 % cream Apply 1 application topically as needed for muscle pain.    warfarin (COUMADIN) 5 MG tablet TAKE 1 TO 3 TABLETS DAILY OR AS DIRECTED BY THE COUMADIN CLINIC    No facility-administered encounter medications on file as of 05/13/2021.   Reviewed chart prior to disease state call. Spoke with patient regarding BP  Recent Office Vitals: BP Readings from Last 3 Encounters:  04/15/21 136/72  04/02/21 117/62  02/15/21 128/76    Pulse Readings from Last 3 Encounters:  04/15/21 65  04/02/21 78  02/15/21 87    Wt Readings from Last 3 Encounters:  04/16/21 218 lb (98.9 kg)  04/15/21 218 lb 9.6 oz (99.2 kg)  02/22/21 219 lb (99.3 kg)     Kidney Function Lab Results  Component Value Date/Time   CREATININE 2.30 (H) 05/10/2021 08:47 AM   CREATININE 2.04 (H) 04/02/2021 01:22 PM   GFR 20.67 (L) 02/15/2021 11:32 AM   GFRNONAA 25 (L) 04/02/2021 01:22 PM   GFRAA 30 (L) 08/03/2020 09:13 AM    BMP Latest Ref Rng & Units 05/10/2021 04/02/2021 02/15/2021  Glucose 65 - 99 mg/dL 80 97 106(H)  BUN 8 - 27 mg/dL 51(H) 39(H) 57(H)  Creatinine 0.57 - 1.00 mg/dL 2.30(H) 2.04(H) 2.24(H)  BUN/Creat Ratio 12 - 28 22 - -  Sodium 134 - 144 mmol/L 145(H) 138 140  Potassium 3.5 - 5.2 mmol/L 4.2 3.9 4.4  Chloride 96 - 106 mmol/L 100 100 96  CO2 20 - 29 mmol/L 29 29 34(H)  Calcium 8.7 - 10.3 mg/dL 10.4(H) 9.1 9.2    Current antihypertensive regimen:  Metoprolol tartrate 100 mg 1/2 tablet twice daily How often are you checking your Blood Pressure? daily Current home BP readings: 7/19 - 99/61 p 85, 7/20 - 96/60 p 92, 7/21 - 130/63 p 84, 7/22 - 137/68 p 77, 7/25 - 110/74 p 94, 7/26 104/65 p 79, 7/28 - 142/86 p 100. What recent interventions/DTPs have been made by any provider to improve Blood Pressure control since last CPP Visit: None. Any recent hospitalizations or ED visits since last visit with CPP? No  Adherence Review: Is the patient currently on ACE/ARB medication? No Does the patient have >5 day gap between last estimated fill dates? No  Notes: Spoke with patient and reviewed all medications as listed. Patient reports taking medications as prescribed and no issues at this time. Patient checks her blood pressure daily and reports her readings as being more in the normal range and feeling much better. Patient has not been experiencing any dizziness only some fatigue but, believes it mostly comes from her heart condition.  Patient stated she is on the kidney diet and on a daily basis she has 2 eggs, cream of wheat and fruit for breakfast with water. Patient stated she does not typically eat lunch and she will have a small snack like peanut butter and crackers or some fruit. Patient cooks dinner a lot and mostly has chicken and fish but she did have a hamburger last night. Patient eats red meat a couple times a week and does not cook with  or add salt to her food. Patient has at least two servings of vegetables a day. Patient states she drinks a lot of water and has around 6 bottles a day and does have some sweet tea and ginger ale as well. Patient is not able to do strenuous exercise with her heart condition. She does go up and down the stairs at her sons because he lives on the third floor in an apartment and she gets walking in when she goes to town. She keeps her room and bathroom at her sons clean and cooks a lot still. Patient keeps a log of her blood pressures and she thanked me for my call. Patient stated she loves being in our program and hearing from [place]!  Care Gaps:  AWV -  scheduled for 02/16/22 Zoster vaccines [facility]) - never done Urine microalbumin - overdue since 05/21/20 Ophthalmology exam - overdue since 06/17/20 Foot exam - overdue since 09/08/20 COVID -19 vaccine - booster 4 overdue since 11/12/20   Star Rating Drugs:  Atorvastatin 73m - [facility]06/21 [email or web address]- [facility] on [facility]/28/21 [phone]

## 2021-05-14 ENCOUNTER — Other Ambulatory Visit: Payer: Self-pay

## 2021-05-14 ENCOUNTER — Encounter: Payer: Self-pay | Admitting: Internal Medicine

## 2021-05-14 ENCOUNTER — Ambulatory Visit (INDEPENDENT_AMBULATORY_CARE_PROVIDER_SITE_OTHER): Payer: Medicare Other | Admitting: Internal Medicine

## 2021-05-14 VITALS — BP 130/70 | HR 78 | Ht 62.0 in | Wt 218.5 lb

## 2021-05-14 DIAGNOSIS — E11319 Type 2 diabetes mellitus with unspecified diabetic retinopathy without macular edema: Secondary | ICD-10-CM

## 2021-05-14 DIAGNOSIS — Z794 Long term (current) use of insulin: Secondary | ICD-10-CM | POA: Diagnosis not present

## 2021-05-14 DIAGNOSIS — E1159 Type 2 diabetes mellitus with other circulatory complications: Secondary | ICD-10-CM | POA: Diagnosis not present

## 2021-05-14 DIAGNOSIS — E1122 Type 2 diabetes mellitus with diabetic chronic kidney disease: Secondary | ICD-10-CM

## 2021-05-14 DIAGNOSIS — E1140 Type 2 diabetes mellitus with diabetic neuropathy, unspecified: Secondary | ICD-10-CM | POA: Diagnosis not present

## 2021-05-14 DIAGNOSIS — N1831 Chronic kidney disease, stage 3a: Secondary | ICD-10-CM

## 2021-05-14 LAB — POCT GLYCOSYLATED HEMOGLOBIN (HGB A1C): Hemoglobin A1C: 6.8 % — AB (ref 4.0–5.6)

## 2021-05-14 MED ORDER — CAPSAICIN 0.033 % EX CREA: 1.0000 "application " | TOPICAL_CREAM | Freq: Three times a day (TID) | CUTANEOUS | 11 refills | Status: DC

## 2021-05-14 MED ORDER — TRULICITY 0.75 MG/0.5ML ~~LOC~~ SOAJ: 0.7500 mg | SUBCUTANEOUS | 1 refills | Status: DC

## 2021-05-14 NOTE — Patient Instructions (Addendum)
-  Continue Toujeo  26 units daily  -Start Trulicity 6.20 mg weekly  -Humalog correctional insulin:. Use the scale below to help guide you with Breakfast, lunch and Dinner   Blood sugar before meal Number of units to inject  Less than 150 0 unit  151 -  175 1 units  176-  200 2 units  201 -  225 3 units  226 -  250 4 units  251 - 275 5 units  276 - 300 6 units   301 - 325 7 units    HOW TO TREAT LOW BLOOD SUGARS (Blood sugar LESS THAN 70 MG/DL) Please follow the RULE OF 15 for the treatment of hypoglycemia treatment (when your (blood sugars are less than 70 mg/dL)   STEP 1: Take 15 grams of carbohydrates when your blood sugar is low, which includes:  3-4 GLUCOSE TABS  OR 3-4 OZ OF JUICE OR REGULAR SODA OR ONE TUBE OF GLUCOSE GEL    STEP 2: RECHECK blood sugar in 15 MINUTES STEP 3: If your blood sugar is still low at the 15 minute recheck --> then, go back to STEP 1 and treat AGAIN with another 15 grams of carbohydrates.

## 2021-05-14 NOTE — Progress Notes (Signed)
Name: Regina [name]  Age/ Sex: 80 y.o., female   MRN/ DOB: [id number], 09-29-1941     PCP: [name], [name], MD   Reason for Endocrinology Evaluation: Type 2 Diabetes Mellitus  Initial Endocrine Consultative Visit: 09/09/2020    PATIENT IDENTIFIER: Regina [name] is a 80 y.o. female with a past medical history of T2DM, HTN, CAD, OSA on CPAP  and A.Fib. The patient has followed with Endocrinology clinic since 09/09/2020 for consultative assistance with management of her diabetes.  DIABETIC HISTORY:  Ms. Kutner was diagnosed with DM yrs ago. Her hemoglobin A1c has ranged from 6.9% in 2021, peaking at 7.8% in 2020   On her initial visit to our clinic she had an A1c of 7.4% She was on basal insulin and Ozemopic, she was already out of Ozempic and we held off until more data was available about her retinopathy. We started humalog per correction scale   She was started on Farxiga by cardiology 04/2021  Nephrology [facility] - Dr. peoples  SUBJECTIVE:   During the last visit (01/08/2021): A1c 7.4% We continued Toujeo and humaog per CS  Today (05/14/2021): Ms. Privott is here for a follow up on diabetes management.  She is accompanied by her sister Zigmund Daniel . She checks her blood sugars multiple times a day through CGM . The patient has not had hypoglycemic episodes since the last clinic visit   No vomiting or diarrhea  Has burning of the feet for the past 2 months   HOME DIABETES REGIMEN:  Toujeo 26 units daily  CF: Humalog: ( BG-120/30) Farxiga 10 mg daily-through cardiology   Statin: yes ACE-I/ARB: no    CONTINUOUS GLUCOSE MONITORING RECORD INTERPRETATION    Dates of Recording: 7/16-7/29/2022  Sensor description:dexcom  Results statistics:   CGM use % of time 86  Average and SD 171/47  Time in range     58   %  % Time Above 180 36  % Time above 250 6  % Time Below target 0     Glycemic patterns summary: optimal BG's at night but high during the day    Hyperglycemic episodes  postprandial   Hypoglycemic episodes occurred n/a  Overnight periods: trends down    DIABETIC COMPLICATIONS: Microvascular complications:  CKD III, Retinopathy ( hx of laser )  Denies: neuropathy  Last eye exam: Completed 2021    Macrovascular complications:  Non-obstructive CAD  Denies: PVD, CVA     HISTORY:  Past Medical History:  Past Medical History:  Diagnosis Date   Back pain    CHF (congestive heart failure) (HCC)    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 01/2015 Echo: EF 55-60%, Gr 2 DD, mod LVH, mildly dil LA.   Heart disease    Hypertension    Hypothyroidism    Joint pain    Kidney problem    Lower extremity edema    Mini stroke ([place])    Non-obstructive CAD    a. 01/2015 Cardiolite: + inf wall ischemia, EF 56%;  b. 01/2015 Cath: LM nl, LAD 10m LCX min irregs, RCA dominant, 50-658m  Sleep apnea    Swallowing difficulty    Thyroid disease    Past Surgical History:  Past Surgical History:  Procedure  Laterality Date   ABDOMINAL HYSTERECTOMY     BUBBLE STUDY  07/01/2020   Procedure: BUBBLE STUDY;  Surgeon: Elouise Munroe, MD;  Location: [place];  Service: Cardiovascular;;   BUBBLE STUDY  08/05/2020   Procedure: BUBBLE STUDY;  Surgeon: Geralynn Rile, MD;  Location: [place];  Service: Cardiovascular;;  done with definity    CARDIAC CATHETERIZATION     ESOPHAGOGASTRODUODENOSCOPY     a. 01/2015 EGD: patent esophagus.   ESOPHAGOGASTRODUODENOSCOPY N/A 01/20/2015   Procedure: ESOPHAGOGASTRODUODENOSCOPY (EGD);  Surgeon: Carol Ada, MD;  [facility] ENDOSCOPY;  Service: Endoscopy;  Laterality: N/A;   HAND SURGERY     JOINT REPLACEMENT     reports history bilateral TKA and right TSA   LEFT HEART  CATHETERIZATION WITH CORONARY ANGIOGRAM N/A 01/19/2015   TEE WITHOUT CARDIOVERSION  05/04/2020   TEE WITHOUT CARDIOVERSION N/A 05/05/2020   Procedure: TRANSESOPHAGEAL ECHOCARDIOGRAM (TEE);  Surgeon: Pixie Casino, MD;  Location: [facility] ENDOSCOPY;  Service: Cardiovascular;  Laterality: N/A;   TEE WITHOUT CARDIOVERSION N/A 07/01/2020   Procedure: TRANSESOPHAGEAL ECHOCARDIOGRAM (TEE);  Surgeon: Elouise Munroe, MD;  Location: [employer];  Service: Cardiovascular;  Laterality: N/A;   TEE WITHOUT CARDIOVERSION N/A 08/05/2020   Procedure: TRANSESOPHAGEAL ECHOCARDIOGRAM (TEE);  Surgeon: Geralynn Rile, MD;  Location: [place];  Service: Cardiovascular;  Laterality: N/A;   THYROIDECTOMY     Social History:  reports that she has never smoked. She has never used smokeless tobacco. She reports that she does not drink alcohol. No history on file for drug use. Family History:  Family History  Problem Relation Age of Onset   Heart disease Mother    Hypertension Mother    Cancer Father    Alcoholism Father      HOME MEDICATIONS: Allergies as of 05/14/2021       Reactions   Ace Inhibitors Other (See Comments)   unknown   Amlodipine Other (See Comments)   unknown   Atenolol Other (See Comments)   bradycardia   Avandia [rosiglitazone] Other (See Comments)   unknown   Darvon [propoxyphene] Other (See Comments)   unknown   Erythromycin Itching   Hydralazine Other (See Comments)   Burning in throat and chest   Hydrocodone Other (See Comments)   Hallucinations.   Levofloxacin Itching   Morphine And Related Other (See Comments)   Dizzy and hallucianation, vomiting; Willing to try low dose   Percocet [oxycodone-acetaminophen] Other (See Comments)   hallucination   Spironolactone Other (See Comments)   unknown   Tramadol Other (See Comments)   Unknown/does not recall reaction but does not want to take again        Medication List        Accurate as of May 14, 2021  2:39  PM. If you have any questions, ask your nurse or doctor.          Accu-Chek [employer] Kit Use to test blood sugar up to 3 times daily   Accu-Chek Guide test strip Generic drug: glucose blood Use to test blood sugar up to 3 times daily   Accu-Chek Guide w/Device Kit 1 Device by Does not apply route 3 (three) times daily.   allopurinol 100 MG tablet Commonly known as: ZYLOPRIM Take 1 tablet (100 mg total) by mouth daily.   atorvastatin 20 MG tablet Commonly known as: LIPITOR Take 1 tablet (20 mg total) by mouth daily.   B-D SINGLE USE SWABS REGULAR Pads Use to test blood sugar up to  3 times daily   cetirizine 10 MG tablet Commonly known as: ZYRTEC Take 10 mg by mouth daily.   Dexcom G6 Sensor Misc 1 Device by Does not apply route as directed.   Dexcom G6 Transmitter Misc 1 Device by Does not apply route as directed.   diclofenac Sodium 1 % Gel Commonly known as: VOLTAREN Apply 1 application topically daily as needed (arthritis pain/hands).   famotidine 20 MG tablet Commonly known as: PEPCID Take 1 tablet (20 mg total) by mouth at bedtime.   Farxiga 10 MG Tabs tablet Generic drug: dapagliflozin propanediol Take 1 tablet (10 mg total) by mouth daily.   fluticasone 50 MCG/ACT nasal spray Commonly known as: FLONASE Place 1 spray into both nostrils daily.   gabapentin 600 MG tablet Commonly known as: NEURONTIN Take 1 tablet (600 mg total) by mouth 2 (two) times daily.   insulin lispro 100 UNIT/ML KwikPen Commonly known as: HumaLOG KwikPen Max daily 15 units   Insulin Pen Needle 32G X 4 MM Misc 1 Device by Does not apply route in the morning, at noon, in the evening, and at bedtime.   levothyroxine 150 MCG tablet Commonly known as: SYNTHROID Take 1 tablet (150 mcg total) by mouth daily before breakfast.   magnesium oxide 400 (240 Mg) MG tablet Commonly known as: MAG-OX Take 1 tablet (400 mg total) by mouth daily.   meclizine 25 MG tablet Commonly  known as: ANTIVERT Take 25 mg by mouth daily as needed for dizziness.   metoprolol succinate 25 MG 24 hr tablet Commonly known as: Toprol XL Take 1 tablet (25 mg total) by mouth daily.   MIRALAX PO Take by mouth daily as needed.   nitroGLYCERIN 0.4 MG SL tablet Commonly known as: NITROSTAT Place 1 tablet (0.4 mg total) under the tongue every 5 (five) minutes as needed for chest pain.   SYSTANE BALANCE OP Place 1 drop into both eyes daily as needed (for dry eyes).   Tafamidis 61 MG Caps Take 61 mg by mouth daily.   tiZANidine 4 MG tablet Commonly known as: Zanaflex Take 0.5-1 tablets (2-4 mg total) by mouth at bedtime as needed for muscle spasms.   torsemide 20 MG tablet Commonly known as: DEMADEX Take 2 tablets (40 mg total) by mouth daily. Ok to take extra dose if wt gain of 1 lb in 1 day or 5 lbs in 7 days   Toujeo SoloStar 300 UNIT/ML Solostar Pen Generic drug: insulin glargine (1 Unit Dial) Inject 26 Units into the skin daily. Eat a snack with protein nightly before bedtime.   trolamine salicylate 10 % cream Commonly known as: ASPERCREME Apply 1 application topically as needed for muscle pain.   vitamin C 1000 MG tablet Take 1,000 mg by mouth daily.   Vitamin D3 50 MCG (2000 UT) capsule Take 2,000 Units by mouth daily.   warfarin 5 MG tablet Commonly known as: COUMADIN Take as directed by the anticoagulation clinic. If you are unsure how to take this medication, talk to your nurse or doctor. Original instructions: TAKE 1 TO 3 TABLETS DAILY OR AS DIRECTED BY THE COUMADIN CLINIC         OBJECTIVE:   Vital Signs: BP 130/70   Pulse 78   Ht 5' 2"  (1.575 m)   Wt 218 lb 8 oz (99.1 kg)   SpO2 98%   BMI 39.96 kg/m   Wt Readings from Last 3 Encounters:  05/14/21 218 lb 8 oz (99.1 kg)  04/16/21 218  lb (98.9 kg)  04/15/21 218 lb 9.6 oz (99.2 kg)     Exam: General: Pt appears well and is in NAD  Lungs: Clear with good BS bilat   Heart: RRR   Extremities:  No pretibial edema.   Neuro: MS is good with appropriate affect, pt is alert and Ox3     DM foot exam: 05/14/2021   The skin of the feet is intact without sores or ulcerations. The pedal pulses are undetectable The sensation is decreased to a screening 5.07, 10 gram monofilament bilaterally   DATA REVIEWED:  Lab Results  Component Value Date   HGBA1C 6.8 (A) 05/14/2021   HGBA1C 7.4 (A) 01/08/2021   HGBA1C 7.4 (A) 09/09/2020   Lab Results  Component Value Date   MICROALBUR 1.9 05/22/2019   LDLCALC 49 03/24/2020   CREATININE 2.30 (H) 05/10/2021   Lab Results  Component Value Date   MICRALBCREAT 6.0 05/22/2019     Lab Results  Component Value Date   CHOL 117 03/24/2020   HDL 56 03/24/2020   LDLCALC 49 03/24/2020   TRIG 54 03/24/2020   CHOLHDL 2 05/22/2019         ASSESSMENT / PLAN / RECOMMENDATIONS:   1) Type 2 Diabetes Mellitus, Optimally controlled, With retinopathic, neuropathic , CKD III and Macrovascular   complications - Most recent A1c of 6.8%. Goal A1c < 7.5 %.    -I have praised the patient on improved glycemic control with an A1c of 6.8% -She has been using the Dexcom successfully -She is on [place] through cardiology -I am going to start her on a small dose of Trulicity, she has no prior history of pancreatitis -She tends to adjust her Toujeo between 20-30 units, patient advised to avoid this practice due to increased risk of hypoglycemia and to remain on 26 units daily  -I am going to adjust her correction scale as it seems it is not tight enough  MEDICATIONS: Continue Toujeo  26 units daily  Correction Scale  : Humalog ( BG -599/77) Start Trulicity 4.14 mg weekly Patient on Farxiga 10 mg to cardiology    EDUCATION / INSTRUCTIONS: BG monitoring instructions: Patient is instructed to check her blood sugars 3 times a day, before meals . Call [facility] if: BG persistently < 70  I reviewed the Rule of 15 for the treatment of  hypoglycemia in detail with the patient. Literature supplied.      2) Diabetic complications:  Eye: Does  have known diabetic retinopathy.  Neuro/ Feet: Does have known diabetic peripheral neuropathy. Renal: Patient does have known baseline CKD. She is not on an ACEI/ARB at present.    3) Peripheral Neuropathy:  -Her symptoms are mild and tolerable, I would like to avoid gabapentin or Lyrica at this time due to low GFR.  I have recommended capsaicin cream 3 times daily as needed     F/U in 4 months    Signed electronically by: Mack Guise, MD  [facility] Endocrinology  [address] [place], [place] [address] Phone: [phone] FAX: [phone]   CC: [name], [name], [place] [address] Phone: [phone]  Fax: [phone]  Return to Endocrinology clinic as below: Future Appointments  Date Time Provider [place]  05/14/2021  2:40 PM Robley Matassa, Melanie Crazier, MD [facility] None  05/19/2021  9:40 AM O'Neal, Cassie Freer, MD [facility]  06/04/2021  3:30 PM [facility]  06/10/2021  1:45 PM Valora Piccolo Barnabas Lister, RD [facility]  06/18/2021 11:00 AM Marcial Pacas, MD [facility] None  07/14/2021  2:00 PM [facility] CCM PHARMACIST [facility]  02/16/2022  9:30 AM [facility]-NURSE HEALTH ADVISOR 2 [facility]

## 2021-05-18 ENCOUNTER — Telehealth: Payer: Self-pay | Admitting: Pharmacist

## 2021-05-18 NOTE — Chronic Care Management (AMB) (Signed)
Chronic Care Management Pharmacy Assistant   Name: Regina [name]  MRN: [id number] DOB: 07-19-1941  Called patient and spoke with her per [name] request to run through [facility] pharmacy details of medication delivery and how the packaging system works. Patient had informed pharmacy that she would call them when medications were needed but patient states that she was confused on how exactly the system works.I advised and explained to patient that pharmacy sends medications out to get her lined up to where all of her medications that are needed in the system will come at the same time. Patient would like to continue getting medications through packaging and verbalized understanding. Patient stated she cannot afford trulicity. I sent a message to [name] to make aware and patient states she's been denied patient assistance twice now for the trulicity. I advised patient to keep a check on the mail in case we are able to try again.   Medications: Outpatient Encounter Medications as of 05/18/2021  Medication Sig Note   Alcohol Swabs (B-D SINGLE USE SWABS REGULAR) PADS Use to test blood sugar up to 3 times daily    allopurinol (ZYLOPRIM) 100 MG tablet Take 1 tablet (100 mg total) by mouth daily.    Ascorbic Acid (VITAMIN C) 1000 MG tablet Take 1,000 mg by mouth daily.    atorvastatin (LIPITOR) 20 MG tablet Take 1 tablet (20 mg total) by mouth daily.    Blood Glucose Monitoring Suppl (ACCU-CHEK GUIDE) w/Device KIT 1 Device by Does not apply route 3 (three) times daily.    Capsaicin 0.033 % CREA Apply 1 application topically 3 (three) times daily.    cetirizine (ZYRTEC) 10 MG tablet Take 10 mg by mouth daily.    Cholecalciferol (VITAMIN D3) 50 MCG (2000 UT) capsule Take 2,000 Units by mouth daily.    Continuous Blood Gluc Sensor (DEXCOM G6 SENSOR) MISC 1 Device by Does not apply route as directed.    Continuous Blood Gluc Transmit (DEXCOM G6 TRANSMITTER) MISC 1 Device by Does not  apply route as directed.    diclofenac Sodium (VOLTAREN) 1 % GEL Apply 1 application topically daily as needed (arthritis pain/hands).    Dulaglutide (TRULICITY) 2.20 UR/4.2HC SOPN Inject 0.75 mg into the skin once a week.    famotidine (PEPCID) 20 MG tablet Take 1 tablet (20 mg total) by mouth at bedtime.    FARXIGA 10 MG TABS tablet Take 1 tablet (10 mg total) by mouth daily.    fluticasone (FLONASE) 50 MCG/ACT nasal spray Place 1 spray into both nostrils daily.    gabapentin (NEURONTIN) 600 MG tablet Take 1 tablet (600 mg total) by mouth 2 (two) times daily.    glucose blood (ACCU-CHEK GUIDE) test strip Use to test blood sugar up to 3 times daily    insulin glargine, 1 Unit Dial, (TOUJEO SOLOSTAR) 300 UNIT/ML Solostar Pen Inject 26 Units into the skin daily. Eat a snack with protein nightly before bedtime.    insulin lispro (HUMALOG KWIKPEN) 100 UNIT/ML KwikPen Max daily 15 units    Insulin Pen Needle 32G X 4 MM MISC 1 Device by Does not apply route in the morning, at noon, in the evening, and at bedtime.    Lancets Misc. (ACCU-CHEK FASTCLIX LANCET) KIT Use to test blood sugar up to 3 times daily    levothyroxine (SYNTHROID) 150 MCG tablet Take 1 tablet (150 mcg total) by mouth daily before breakfast.    magnesium oxide (MAG-OX) 400 (240 Mg) MG tablet  Take 1 tablet (400 mg total) by mouth daily.    meclizine (ANTIVERT) 25 MG tablet Take 25 mg by mouth daily as needed for dizziness.  01/08/2021: prn   metoprolol succinate (TOPROL XL) 25 MG 24 hr tablet Take 1 tablet (25 mg total) by mouth daily.    nitroGLYCERIN (NITROSTAT) 0.4 MG SL tablet Place 1 tablet (0.4 mg total) under the tongue every 5 (five) minutes as needed for chest pain.    Polyethylene Glycol 3350 (MIRALAX PO) Take by mouth daily as needed.    Propylene Glycol (SYSTANE BALANCE OP) Place 1 drop into both eyes daily as needed (for dry eyes).    Tafamidis 61 MG CAPS Take 61 mg by mouth daily.    tiZANidine (ZANAFLEX) 4 MG tablet Take  0.5-1 tablets (2-4 mg total) by mouth at bedtime as needed for muscle spasms.    torsemide (DEMADEX) 20 MG tablet Take 2 tablets (40 mg total) by mouth daily. Ok to take extra dose if wt gain of 1 lb in 1 day or 5 lbs in 7 days    trolamine salicylate (ASPERCREME) 10 % cream Apply 1 application topically as needed for muscle pain.    warfarin (COUMADIN) 5 MG tablet TAKE 1 TO 3 TABLETS DAILY OR AS DIRECTED BY THE COUMADIN CLINIC    No facility-administered encounter medications on file as of 05/18/2021.    Star Rating Drugs:  Atorvastatin 42m - last filled on 04/06/21 90DS at [email or web address]- last [facility] on 04/28/21 30DS at [phone]

## 2021-05-18 NOTE — Progress Notes (Signed)
Cardiology Office Note:   Date:  05/19/2021  NAME:  Regina [name]    MRN: [id number] DOB:  [birth year]   PCP:  [name], [name], MD  Cardiologist:  Evalina Field, MD  Electrophysiologist:  None   Referring MD: [name], [name], MD   Chief Complaint  Patient presents with   Follow-up   History of Present Illness:   Regina [name] is a 80 y.o. female with below medical history who presents for follow-up. Seen 04/15/2021 with complaints of dizziness and SOB. Metoprolol decreased to 25 daily. EF has improved to 45%. RVSP normal. IVC collapsing.  She actually is doing well.  Weight is stable at 220 pounds.  No increased volume overload.  I went over the results of her echocardiogram with her in the office.  This shows that her EF has recovered.  No elevated RVSP.  Collapsing IVC.  She is not volume overloaded.  Blood pressure 130/72.  I recommended to continue her torsemide as she takes daily.  She is now working with endocrinology.  Most recent A1c 6.8.  She reports no increase shortness of breath.  We did discuss cardiac rehab.  I think rehab would be good for her.  She needs to get more active.  Her cardiac issues appear to be stable.  Most recent INR is stable as well.  She denies any chest pain or trouble breathing in the office.  Overall doing well.  Problem List 1.  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 Persistent Afib -LAA slugde vs thrombus 05/05/2020  -persistent sludge 07/01/2020 -LAA thrombus persistent 08/05/2020 -on warfarin 3. DM -A1c 6.8 4. HLD -T chol 117, HDL 56, LDL 49, triglycerides 54 5. HTN 6. Non-obstructive CAD -LHC 01/19/2015 [facility] -50% mid RCA and 50% LAD -MPI 08/14/2020 -> normal 7. OSA 8. CKD 3/4 9. CVA -11/2020 @ [facility]   Past Medical History: Past Medical History:  Diagnosis Date   Back pain    CHF  (congestive heart failure) (HCC)    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 01/2015 Echo: EF 55-60%, Gr 2 DD, mod LVH, mildly dil LA.   Heart disease    Hypertension    Hypothyroidism    Joint pain    Kidney problem    Lower extremity edema    Mini stroke ([place])    Non-obstructive CAD    a. 01/2015 Cardiolite: + inf wall ischemia, EF 56%;  b. 01/2015 Cath: LM nl, LAD 64m LCX min irregs, RCA dominant, 50-676m  Sleep apnea    Swallowing difficulty    Thyroid disease     Past Surgical History: Past Surgical History:  Procedure Laterality Date   ABDOMINAL HYSTERECTOMY     BUBBLE STUDY  07/01/2020   Procedure: BUBBLE STUDY;  Surgeon: AcElouise MunroeMD;  [facility]: Cardiovascular;;   BUBBLE STUDY  08/05/2020   Procedure: BUBBLE STUDY;  Surgeon: O'Geralynn RileMD;  [facility]: Cardiovascular;;  done with definity    CARDIAC CATHETERIZATION     ESOPHAGOGASTRODUODENOSCOPY     a. 01/2015 EGD: patent  esophagus.   ESOPHAGOGASTRODUODENOSCOPY N/A 01/20/2015   Procedure: ESOPHAGOGASTRODUODENOSCOPY (EGD);  Surgeon: Carol Ada, MD;  [facility] ENDOSCOPY;  Service: Endoscopy;  Laterality: N/A;   HAND SURGERY     JOINT REPLACEMENT     reports history bilateral TKA and right TSA   LEFT HEART CATHETERIZATION WITH CORONARY ANGIOGRAM N/A 01/19/2015   TEE WITHOUT CARDIOVERSION  05/04/2020   TEE WITHOUT CARDIOVERSION N/A 05/05/2020   Procedure: TRANSESOPHAGEAL ECHOCARDIOGRAM (TEE);  Surgeon: Pixie Casino, MD;  Location: [facility] ENDOSCOPY;  Service: Cardiovascular;  Laterality: N/A;   TEE WITHOUT CARDIOVERSION N/A 07/01/2020   Procedure: TRANSESOPHAGEAL ECHOCARDIOGRAM (TEE);  Surgeon: Elouise Munroe, MD;  Location: [place];   Service: Cardiovascular;  Laterality: N/A;   TEE WITHOUT CARDIOVERSION N/A 08/05/2020   Procedure: TRANSESOPHAGEAL ECHOCARDIOGRAM (TEE);  Surgeon: Geralynn Rile, MD;  Location: [place];  Service: Cardiovascular;  Laterality: N/A;   THYROIDECTOMY      Current Medications: Current Meds  Medication Sig   Alcohol Swabs (B-D SINGLE USE SWABS REGULAR) PADS Use to test blood sugar up to 3 times daily   allopurinol (ZYLOPRIM) 100 MG tablet Take 1 tablet (100 mg total) by mouth daily.   Ascorbic Acid (VITAMIN C) 1000 MG tablet Take 1,000 mg by mouth daily.   atorvastatin (LIPITOR) 20 MG tablet Take 1 tablet (20 mg total) by mouth daily.   Blood Glucose Monitoring Suppl (ACCU-CHEK GUIDE) w/Device KIT 1 Device by Does not apply route 3 (three) times daily.   Capsaicin 0.033 % CREA Apply 1 application topically 3 (three) times daily.   cetirizine (ZYRTEC) 10 MG tablet Take 10 mg by mouth daily.   Cholecalciferol (VITAMIN D3) 50 MCG (2000 UT) capsule Take 2,000 Units by mouth daily.   Continuous Blood Gluc Sensor (DEXCOM G6 SENSOR) MISC 1 Device by Does not apply route as directed.   Continuous Blood Gluc Transmit (DEXCOM G6 TRANSMITTER) MISC 1 Device by Does not apply route as directed.   diclofenac Sodium (VOLTAREN) 1 % GEL Apply 1 application topically daily as needed (arthritis pain/hands).   famotidine (PEPCID) 20 MG tablet Take 1 tablet (20 mg total) by mouth at bedtime.   FARXIGA 10 MG TABS tablet Take 1 tablet (10 mg total) by mouth daily.   fluticasone (FLONASE) 50 MCG/ACT nasal spray Place 1 spray into both nostrils daily.   gabapentin (NEURONTIN) 600 MG tablet Take 1 tablet (600 mg total) by mouth 2 (two) times daily.   glucose blood (ACCU-CHEK GUIDE) test strip Use to test blood sugar up to 3 times daily   insulin glargine, 1 Unit Dial, (TOUJEO SOLOSTAR) 300 UNIT/ML Solostar Pen Inject 26 Units into the skin daily. Eat a snack with protein nightly before bedtime.   insulin  lispro (HUMALOG KWIKPEN) 100 UNIT/ML KwikPen Max daily 15 units   Insulin Pen Needle 32G X 4 MM MISC 1 Device by Does not apply route in the morning, at noon, in the evening, and at bedtime.   Lancets Misc. (ACCU-CHEK FASTCLIX LANCET) KIT Use to test blood sugar up to 3 times daily   levothyroxine (SYNTHROID) 150 MCG tablet Take 1 tablet (150 mcg total) by mouth daily before breakfast.   magnesium oxide (MAG-OX) 400 (240 Mg) MG tablet Take 1 tablet (400 mg total) by mouth daily.   meclizine (ANTIVERT) 25 MG tablet Take 25 mg by mouth daily as needed for dizziness.    metoprolol succinate (TOPROL XL) 25 MG 24 hr tablet Take 1 tablet (25 mg total) by mouth  daily.   nitroGLYCERIN (NITROSTAT) 0.4 MG SL tablet Place 1 tablet (0.4 mg total) under the tongue every 5 (five) minutes as needed for chest pain.   Polyethylene Glycol 3350 (MIRALAX PO) Take by mouth daily as needed.   Propylene Glycol (SYSTANE BALANCE OP) Place 1 drop into both eyes daily as needed (for dry eyes).   Tafamidis 61 MG CAPS Take 61 mg by mouth daily.   tiZANidine (ZANAFLEX) 4 MG tablet Take 0.5-1 tablets (2-4 mg total) by mouth at bedtime as needed for muscle spasms.   torsemide (DEMADEX) 20 MG tablet Take 2 tablets (40 mg total) by mouth daily. Ok to take extra dose if wt gain of 1 lb in 1 day or 5 lbs in 7 days   trolamine salicylate (ASPERCREME) 10 % cream Apply 1 application topically as needed for muscle pain.   warfarin (COUMADIN) 5 MG tablet TAKE 1 TO 3 TABLETS DAILY OR AS DIRECTED BY THE COUMADIN CLINIC     Allergies:    Ace inhibitors, Amlodipine, Atenolol, Avandia [rosiglitazone], Darvon [propoxyphene], Erythromycin, Hydralazine, Hydrocodone, Levofloxacin, Morphine and related, Percocet [oxycodone-acetaminophen], Spironolactone, and Tramadol   Social History: Social History   Socioeconomic History   Marital status: Widowed    Spouse name: Not on file   Number of children: Not on file   Years of education: Not on  file   Highest education level: Not on file  Occupational History   Occupation: Retired  Tobacco Use   Smoking status: Never   Smokeless tobacco: Never  Vaping Use   Vaping Use: Never used  Substance and Sexual Activity   Alcohol use: No   Drug use: Not on file   Sexual activity: Not Currently  Other Topics Concern   Not on file  Social History Narrative   Lives with daughter   Social Determinants of Health   Financial Resource Strain: Low Risk    Difficulty of Paying Living Expenses: Not hard at all  Food Insecurity: No Food Insecurity   Worried About Charity fundraiser in the Last Year: Never true   Arboriculturist in the Last Year: Never true  Transportation Needs: No Transportation Needs   Lack of Transportation (Medical): No   Lack of Transportation (Non-Medical): No  Physical Activity: Inactive   Days of Exercise per Week: 0 days   Minutes of Exercise per Session: 0 min  Stress: No Stress Concern Present   Feeling of Stress : Not at all  Social Connections: Moderately Isolated   Frequency of Communication with Friends and Family: More than three times a week   Frequency of Social Gatherings with Friends and Family: Twice a week   Attends Religious Services: More than 4 times per year   Active Member of [employer] or Organizations: No   Attends Archivist Meetings: Never   Marital Status: Widowed     Family History: The patient's family history includes Alcoholism in her father; Cancer in her father; Heart disease in her mother; Hypertension in her mother.  ROS:   All other ROS reviewed and negative. Pertinent positives noted in the HPI.     EKGs/Labs/Other Studies Reviewed:   The following studies were personally reviewed by me today:   TTE 04/21/2021  1. Left ventricular ejection fraction, by estimation, is 45 to 50%. The  left ventricle has mildly decreased function. The left ventricle  demonstrates global hypokinesis. There is moderate concentric  left  ventricular hypertrophy. Left ventricular  diastolic function could  not be evaluated.   2. Right ventricular systolic function is moderately reduced. The right  ventricular size is normal. Tricuspid regurgitation signal is inadequate  for assessing PA pressure.   3. Left atrial size was moderately dilated.   4. The mitral valve is grossly normal. Mild mitral valve regurgitation.  No evidence of mitral stenosis.   5. The aortic valve is tricuspid. Aortic valve regurgitation is not  visualized. No aortic stenosis is present.   6. The inferior vena cava is normal in size with greater than 50%  respiratory variability, suggesting right atrial pressure of 3 mmHg.   Recent Labs: 05/28/2020: BNP 630.4 06/08/2020: TSH 3.24 02/15/2021: Magnesium 2.2 04/02/2021: Hemoglobin 12.0; Platelets 249 05/10/2021: BUN 51; Creatinine, Ser 2.30; Potassium 4.2; Sodium 145   Recent Lipid Panel    Component Value Date/Time   CHOL 117 03/24/2020 1428   TRIG 54 03/24/2020 1428   HDL 56 03/24/2020 1428   CHOLHDL 2 05/22/2019 1631   VLDL 23.8 05/22/2019 1631   LDLCALC 49 03/24/2020 1428    Physical Exam:   VS:  BP 130/72   Pulse 86   Ht 5' 2"  (1.575 m)   Wt 220 lb 9.6 oz (100.1 kg)   SpO2 97%   BMI 40.35 kg/m    Wt Readings from Last 3 Encounters:  05/19/21 220 lb 9.6 oz (100.1 kg)  05/14/21 218 lb 8 oz (99.1 kg)  04/16/21 218 lb (98.9 kg)    General: Well nourished, well developed, in no acute distress Head: Atraumatic, normal size  Eyes: PEERLA, EOMI  Neck: Supple, no JVD Endocrine: No thryomegaly Cardiac: Normal S1, S2; irregular rhythm, no murmurs rubs or gallops Lungs: Clear to auscultation bilaterally, no wheezing, rhonchi or rales  Abd: Soft, nontender, no hepatomegaly  Ext: No edema, pulses 2+ Musculoskeletal: No deformities, BUE and BLE strength normal and equal Skin: Warm and dry, no rashes   Neuro: Alert and oriented to person, place, time, and situation, CNII-XII grossly  intact, no focal deficits  Psych: Normal mood and affect   ASSESSMENT:   Regina [name] is a 80 y.o. female who presents for the following: 1. Hereditary cardiac amyloidosis ([place])   2. Chronic combined systolic and diastolic CHF (congestive heart failure) (HCC)   3. Persistent atrial fibrillation ([place])   4. Long term (current) use of anticoagulants   5. Persistent left atrial appendage thrombus   6. Coronary artery disease involving native coronary artery of native heart with other form of angina pectoris ([place])     PLAN:   1. Hereditary cardiac amyloidosis ([place]) 2. Chronic combined systolic and diastolic CHF (congestive heart failure) ([place]) -She has hereditary cardiac amyloidosis.  EF had reduced down to 20% when she was in A. fib with RVR.  Rates were controlled.  EF most recently on echocardiogram is up to 45-50%.  Given significant CKD she cannot be on ACE/Arni/ARB/MRA.  She is tolerating Farxiga 10 mg daily well.  She is also on metoprolol succinate 25 mg daily. -We also have her on tafamidis.  She will continue this. -A. fib is bothersome and cardiac amyloidosis but given her stability and recovery of EF I am reluctant to pursue any procedures at this time.  She overall is doing well. -Euvolemic on examination.  She should continue torsemide 40 mg daily. -I think she would benefit from cardiac rehab.  I have referred her to this today.  She will discuss with family if this can be an option.  Apparently there  are transportation issues.  3. Persistent atrial fibrillation ([place]) 4. Long term (current) use of anticoagulants 5. Persistent left atrial appendage thrombus -Persistent atrial fibrillation.  Persistent left atrial appendage thrombus.  She developed this on Eliquis.  We have her on Coumadin.  She has had a stroke.  Unfortunately I would not recommend to go on a DOAC at this time.  I think Coumadin is the best option for her. -Rates are controlled with metoprolol succinate.  No  change. -We have reevaluated a rhythm control strategy.  She is undergone 3 transesophageal echocardiograms with persistent left atrial appendage thrombus.  Family has not wanted to pursue any more procedures.  I agree with this.  We will continue with rate control strategy and anticoagulation with Coumadin.  6. Coronary artery disease involving native coronary artery of native heart with other form of angina pectoris ([place]) -Nonobstructive disease on left heart cath in 26.  Most recent LDL is well controlled.  Not on aspirin given Coumadin.  She will continue statin therapy.  No symptoms of angina.  Recent stress test also normal.     Disposition: Return in about 6 months (around 11/19/2021).  Medication Adjustments/Labs and Tests Ordered: Current medicines are reviewed at length with the patient today.  Concerns regarding medicines are outlined above.  Orders Placed This Encounter  Procedures   AMB referral to cardiac rehabilitation   No orders of the defined types were placed in this encounter.   Patient Instructions   Follow-Up: At [facility], you and your health needs are our priority.  As part of our continuing mission to provide you with exceptional heart care, we have created designated Provider Care Teams.  These Care Teams include your primary Cardiologist (physician) and Advanced Practice Providers (APPs -  Physician Assistants and Nurse Practitioners) who all work together to provide you with the care you need, when you need it.  We recommend signing up for the patient portal called "MyChart".  Sign up information is provided on this After Visit Summary.  MyChart is used to connect with patients for Virtual Visits (Telemedicine).  Patients are able to view lab/test results, encounter notes, upcoming appointments, etc.  Non-urgent messages can be sent to your provider as well.   To learn more about what you can do with MyChart, go to [email or web address].    Your next  appointment:   6 month(s)  The format for your next appointment:   In Person  Provider:   [name], MD     Time Spent with Patient: I have spent a total of 35 minutes with patient reviewing hospital notes, telemetry, EKGs, labs and examining the patient as well as establishing an assessment and plan that was discussed with the patient.  > 50% of time was spent in direct patient care.  Signed, Addison Naegeli. Audie Box, MD, [place]  [address] [place], [place] [address] [phone]  05/19/2021 10:22 AM

## 2021-05-19 ENCOUNTER — Encounter: Payer: Self-pay | Admitting: Cardiovascular Disease

## 2021-05-19 ENCOUNTER — Ambulatory Visit: Payer: Medicare Other | Admitting: Cardiovascular Disease

## 2021-05-19 ENCOUNTER — Other Ambulatory Visit: Payer: Self-pay

## 2021-05-19 VITALS — BP 130/72 | HR 86 | Ht 62.0 in | Wt 220.6 lb

## 2021-05-19 DIAGNOSIS — E854 Organ-limited amyloidosis: Secondary | ICD-10-CM | POA: Diagnosis not present

## 2021-05-19 DIAGNOSIS — I4819 Other persistent atrial fibrillation: Secondary | ICD-10-CM

## 2021-05-19 DIAGNOSIS — Z7901 Long term (current) use of anticoagulants: Secondary | ICD-10-CM

## 2021-05-19 DIAGNOSIS — I43 Cardiomyopathy in diseases classified elsewhere: Secondary | ICD-10-CM

## 2021-05-19 DIAGNOSIS — I5042 Chronic combined systolic (congestive) and diastolic (congestive) heart failure: Secondary | ICD-10-CM | POA: Diagnosis not present

## 2021-05-19 DIAGNOSIS — I513 Intracardiac thrombosis, not elsewhere classified: Secondary | ICD-10-CM | POA: Diagnosis not present

## 2021-05-19 DIAGNOSIS — I25118 Atherosclerotic heart disease of native coronary artery with other forms of angina pectoris: Secondary | ICD-10-CM | POA: Diagnosis not present

## 2021-05-19 NOTE — Patient Instructions (Signed)
  Follow-Up: At [facility], you and your health needs are our priority.  As part of our continuing mission to provide you with exceptional heart care, we have created designated Provider Care Teams.  These Care Teams include your primary Cardiologist (physician) and Advanced Practice Providers (APPs -  Physician Assistants and Nurse Practitioners) who all work together to provide you with the care you need, when you need it.  We recommend signing up for the patient portal called "MyChart".  Sign up information is provided on this After Visit Summary.  MyChart is used to connect with patients for Virtual Visits (Telemedicine).  Patients are able to view lab/test results, encounter notes, upcoming appointments, etc.  Non-urgent messages can be sent to your provider as well.   To learn more about what you can do with MyChart, go to [email or web address].    Your next appointment:   6 month(s)  The format for your next appointment:   In Person  Provider:   [name], MD

## 2021-05-20 ENCOUNTER — Telehealth: Payer: Self-pay | Admitting: *Deleted

## 2021-05-20 DIAGNOSIS — I5042 Chronic combined systolic (congestive) and diastolic (congestive) heart failure: Secondary | ICD-10-CM

## 2021-05-20 NOTE — Telephone Encounter (Signed)
Spoke with pt, aware of the message about cardiac rehab. She would like to try the pulmonary rehab. Referral placed.

## 2021-05-20 NOTE — Telephone Encounter (Signed)
Rowe Pavy, RN  Cristopher Estimable, RN; O'Neal, Cassie Freer, MD Thank you for the referral for this pt to participate in Cardiac Rehab.  Unfortunately with the [facility] Medicare plan EF must be less than 35%.  Her most recent echo shows EF of 45-50%.   Can consider pulmonary rehab with the diagnosis of combined chronic systolic and diastolic heart failure.  2 days a week and non telemetry. About a 2 month wait for scheduling.   If you agree, please place ambulatory referral for pulmonary rehab with the above diagnosis.   Thanks  Psychologist, clinical, BSN  Cardiac and Training and development officer

## 2021-05-24 ENCOUNTER — Encounter (HOSPITAL_COMMUNITY): Payer: Self-pay | Admitting: *Deleted

## 2021-05-24 DIAGNOSIS — G4733 Obstructive sleep apnea (adult) (pediatric): Secondary | ICD-10-CM | POA: Diagnosis not present

## 2021-05-24 NOTE — Progress Notes (Signed)
Received referral from Dr. Marisue Ivan for this pt to participate in pulmonary rehab with the the diagnosis of Chronic combined Systolic and Diastolic Heart Failure. Clinical review of pt follow up appt on  05/19/21 office note.  Pt with Covid Risk Score - 7. Pt appropriate for scheduling for Pulmonary rehab.  Will forward to support staff for scheduling and verification of insurance eligibility/benefits with pt consent when able as there is a wait list. Maurice Small RN, BSN Cardiac and Pulmonary Rehab Nurse Navigator

## 2021-06-02 ENCOUNTER — Ambulatory Visit: Payer: Medicare Other | Admitting: Cardiovascular Disease

## 2021-06-04 ENCOUNTER — Telehealth: Payer: Self-pay | Admitting: Cardiovascular Disease

## 2021-06-04 ENCOUNTER — Other Ambulatory Visit: Payer: Self-pay

## 2021-06-04 ENCOUNTER — Telehealth: Payer: Self-pay | Admitting: Family Medicine

## 2021-06-04 ENCOUNTER — Ambulatory Visit: Payer: Medicare Other

## 2021-06-04 DIAGNOSIS — Z7901 Long term (current) use of anticoagulants: Secondary | ICD-10-CM

## 2021-06-04 DIAGNOSIS — Z794 Long term (current) use of insulin: Secondary | ICD-10-CM

## 2021-06-04 DIAGNOSIS — I4819 Other persistent atrial fibrillation: Secondary | ICD-10-CM

## 2021-06-04 DIAGNOSIS — E1140 Type 2 diabetes mellitus with diabetic neuropathy, unspecified: Secondary | ICD-10-CM

## 2021-06-04 LAB — POCT INR: INR: 2.6 (ref 2.0–3.0)

## 2021-06-04 NOTE — Telephone Encounter (Signed)
The patient needs a referral to see an ophthalmologist specializing in diabetes of the eyes. Has some problems with her vision.

## 2021-06-04 NOTE — Telephone Encounter (Signed)
Pt c/o BP issue: STAT if pt c/o blurred vision, one-sided weakness or slurred speech  1. What are your last 5 BP readings?  06/04/21: 99/66 HR 93 06/03/21: 114/72 HR 88 06/02/21: 100/66 HR 83 06/01/21: 100/67 HR 93 05/31/21: 112/66 HR 92 05/30/21: 122/75 HR 88 05/29/21: 121/71 HR 89 05/28/21: 104/64 HR 80 05/27/21: 97/53 HR 93 05/26/21: 112/71 HR 92 05/25/21: 104/72 HR 93 05/24/21: 96/65 HR 68 05/23/21: 120/72 HR 87 05/21/21: 120/81 HR 79  05/20/21: 114/63 HR 69 05/17/21: 110/67 HR 87   2. Are you having any other symptoms (ex. Dizziness, headache, blurred vision, passed out)? Patient just feels like she needs to sit down all the time  3. What is your BP issue?  Patient is concerned  because her BP is low . She has taken all of her medication regularly so is not sure what to do

## 2021-06-04 NOTE — Patient Instructions (Signed)
Continue taking 1/2 tablet daily except 1 tablet each Sunday.  Repeat INR in 6 weeks

## 2021-06-04 NOTE — Telephone Encounter (Signed)
Spoke to patient Dr.O'Neal's advice given.Appointment scheduled with Dr.O'Neal 8/29 at 10:40 am.

## 2021-06-04 NOTE — Telephone Encounter (Signed)
Spoke to patient she stated she just don't feel good this morning.No chest pain.No sob.She feels tired.Advised to see PCP.B/P readings listed below.Advised I will send message to Dr.O'Neal for review.

## 2021-06-07 NOTE — Telephone Encounter (Signed)
Referral has been placed. 

## 2021-06-08 ENCOUNTER — Telehealth: Payer: Self-pay | Admitting: Family Medicine

## 2021-06-08 IMAGING — DX DG CLAVICLE*L*
2 series · 2 of 2 positions shown · non-contrast
Comparison: None

CLINICAL DATA: Sternoclavicular prominence

EXAM:
LEFT CLAVICLE - 2+ VIEWS

[clavicle ap]
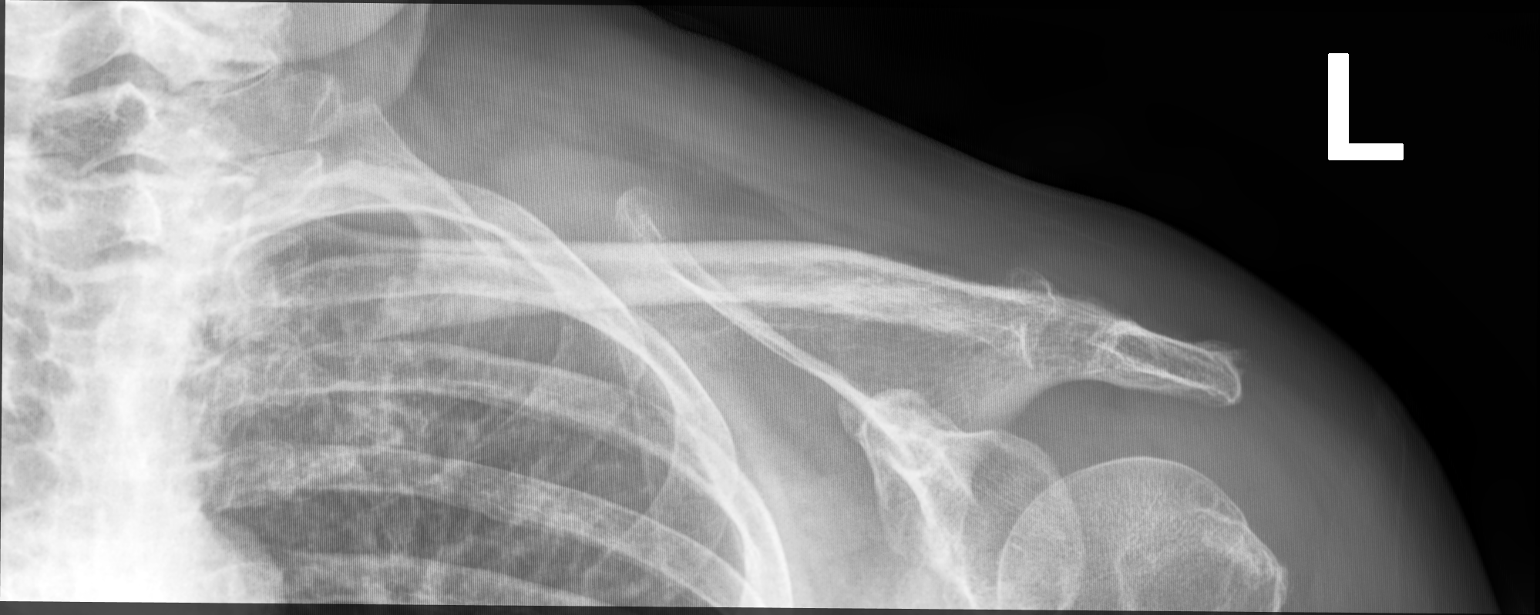

[clavicle tangential]
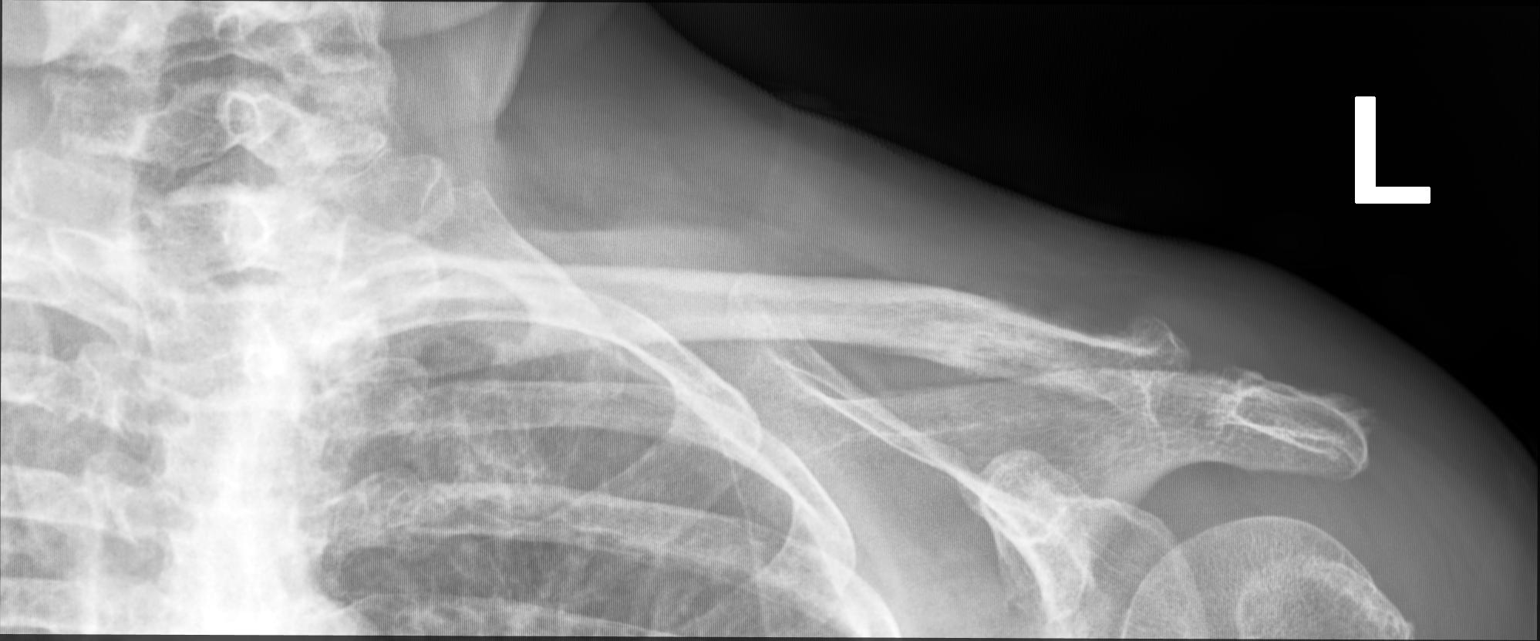

[2 of 2 positions shown; findings below may reference images not displayed]

FINDINGS: Degenerative changes LEFT AC joint.

Bones appear demineralized.

Sternoclavicular joint grossly unremarkable.

No fracture, dislocation, or bone destruction.
IMPRESSION: Degenerative changes at LEFT AC joint.

Otherwise negative exam.

## 2021-06-08 NOTE — Telephone Encounter (Signed)
Crystal from [employer] is calling to inform Dr. [name]  that the patient has experienced low blood pressure readings; they are as follows  93/61- Pulse 78 94/57 -Pulse 106 97/59/ today pulse 93 pt is experiencing fatigue. Pt is scheduled for an 06/09/2021 visit with Dr. [name].  Crystal is requesting a call back to advise [phone]

## 2021-06-08 NOTE — Telephone Encounter (Signed)
She has an appt tomorrow. [name], MD

## 2021-06-09 ENCOUNTER — Ambulatory Visit (INDEPENDENT_AMBULATORY_CARE_PROVIDER_SITE_OTHER): Payer: Medicare Other | Admitting: Family Medicine

## 2021-06-09 ENCOUNTER — Encounter: Payer: Self-pay | Admitting: Family Medicine

## 2021-06-09 ENCOUNTER — Other Ambulatory Visit: Payer: Self-pay

## 2021-06-09 VITALS — BP 126/60 | HR 107 | Resp 16 | Ht 62.0 in | Wt 220.5 lb

## 2021-06-09 DIAGNOSIS — E039 Hypothyroidism, unspecified: Secondary | ICD-10-CM | POA: Diagnosis not present

## 2021-06-09 DIAGNOSIS — K5900 Constipation, unspecified: Secondary | ICD-10-CM

## 2021-06-09 DIAGNOSIS — I700 Atherosclerosis of aorta: Secondary | ICD-10-CM | POA: Insufficient documentation

## 2021-06-09 DIAGNOSIS — I4891 Unspecified atrial fibrillation: Secondary | ICD-10-CM | POA: Diagnosis not present

## 2021-06-09 DIAGNOSIS — I10 Essential (primary) hypertension: Secondary | ICD-10-CM

## 2021-06-09 DIAGNOSIS — N1832 Chronic kidney disease, stage 3b: Secondary | ICD-10-CM

## 2021-06-09 LAB — TSH: TSH: 0.65 u[IU]/mL (ref 0.35–5.50)

## 2021-06-09 NOTE — Patient Instructions (Addendum)
A few things to remember from today's visit:  Essential hypertension  Constipation, unspecified constipation type  Hypothyroidism, unspecified type  If you need refills please call your pharmacy. Do not use My Chart to request refills or for acute issues that need immediate attention.   Blood pressures today 140-180/50's-80. We need to be sure blood pressure readings are accurate. Continue daily wts. Heart rate mildly elevated, continue monitoring it and keep next appointment with your cardiologist.  Take Miralax at night if you do not have a good bowel movement. Adequate fiber intake.  Please be sure medication list is accurate. If a new problem present, please set up appointment sooner than planned today.  How to Take Your Blood Pressure Blood pressure measures how strongly your blood is pressing against the walls of your arteries. Arteries are blood vessels that carry blood from your heartthroughout your body. You can take your blood pressure at home with a machine. You may need to check your blood pressure at home: To check if you have high blood pressure (hypertension). To check your blood pressure over time. To make sure your blood pressure medicine is working. Supplies needed: Blood pressure machine, or monitor. Dining room chair to sit in. Table or desk. Small notebook. Pencil or pen. How to prepare Avoid these things for 30 minutes before checking your blood pressure: Having drinks with caffeine in them, such as coffee or tea. Drinking alcohol. Eating. Smoking. Exercising. Do these things five minutes before checking your blood pressure: Go to the bathroom and pee (urinate). Sit in a dining chair. Do not sit on a soft couch or an armchair. Be quiet. Do not talk. How to take your blood pressure Follow the instructions that came with your machine. If you have a digital blood pressure monitor, these may be the instructions: Sit up straight. Place your feet on the  floor. Do not cross your ankles or legs. Rest your left arm at the level of your heart. You may rest it on a table, desk, or chair. Pull up your shirt sleeve. Wrap the blood pressure cuff around the upper part of your left arm. The cuff should be 1 inch (2.5 cm) above your elbow. It is best to wrap the cuff around bare skin. Fit the cuff snugly around your arm. You should be able to place only one finger between the cuff and your arm. Place the cord so that it rests in the bend of your elbow. Press the power button. Sit quietly while the cuff fills with air and loses air. Write down the numbers on the screen. Wait 2-3 minutes and then repeat steps 1-10. What do the numbers mean? Two numbers make up your blood pressure. The first number is called systolic pressure. The second is called diastolic pressure. An example of a bloodpressure reading is "120 over 80" (or 120/80). If you are an adult and do not have a medical condition, use this guide to findout if your blood pressure is normal: Normal First number: below 120. Second number: below 80. Elevated First number: 120-129. Second number: below 80. Hypertension stage 1 First number: 130-139. Second number: 80-89. Hypertension stage 2 First number: 140 or above. Second number: 78 or above. Your blood pressure is above normal even if only the first or only the secondnumber is above normal. Follow these instructions at home: Medicines Take over-the-counter and prescription medicines only as told by your doctor. Tell your doctor if your medicine is causing side effects. General instructions Check your  blood pressure as often as your doctor tells you to. Check your blood pressure at the same time every day. Take your monitor to your next doctor's appointment. Your doctor will: Make sure you are using it correctly. Make sure it is working right. Understand what your blood pressure numbers should be. Keep all follow-up visits as told by  your doctor. This is important. General tips You will need a blood pressure machine, or monitor. Your doctor can suggest a monitor. You can buy one at a drugstore or online. When choosing one: Choose one with an arm cuff. Choose one that wraps around your upper arm. Only one finger should fit between your arm and the cuff. Do not choose one that measures your blood pressure from your wrist or finger. Where to find more information American Heart Association: www.heart.org Contact a doctor if: Your blood pressure keeps being high. Your blood pressure is suddenly low. Get help right away if: Your first blood pressure number is higher than 180. Your second blood pressure number is higher than 120. Summary Check your blood pressure at the same time every day. Avoid caffeine, alcohol, smoking, and exercise for 30 minutes before checking your blood pressure. Make sure you understand what your blood pressure numbers should be. This information is not intended to replace advice given to you by your health care provider. Make sure you discuss any questions you have with your healthcare provider. Document Revised: 08/12/2020 Document Reviewed: 09/27/2019 Elsevier Patient Education  2022 [employer].

## 2021-06-09 NOTE — Progress Notes (Signed)
Chief Complaint  Patient presents with   Hypotension   HPI: [name] is a 80 y.o. female, who is here today complaining of low BP readings. She has had low BP's intermittently for a while   2 weeks ago her cardiologist stopped Metoprolol because she called reporting low BP's.   Negative for unusual headache, CP, dyspnea, or focal neurologic deficit. She has home BP's:  110/67,114/63,96/65,97/53,104/64,112/66,100/67,100/66,99/64,118/63,93/61,94/57,97/59,123/79.  She does "feel funny in my head", usually when she is up, alleviated by lying down.Medication changes have not helped with symptoms. Torsemide 20 mg 2 tabs daily and an extra tab prn, 1/week.  States that she usually accumulates fluid around her abdomen.  No known history of ascites, states that she has not had imaging done.  Bloating sensation. She has not identified exacerbating or alleviating factors. She has a bowel movement 1 or twice per week. She is not taking OTC medication for constipation. She has not noted blood in stool,melena,or nausea.  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  HFrEF: Last echo on 04/21/2021 showed LVEF 45 to 50% and left ventricle global hypokinesis. Concerned because sometimes BP monitor report irregular HR. Atrial fibrillation, HR usually 80's-106/min. She is on Coumadin.  Sleeping on 1-2 pillows. She denies orthopnea and PND. Lower extremity edema has improved. Negative for gross hematuria, decreased urine output, or foam in urine.  CKD 3: She sees her nephrologist q 6 months, next appt 06/22/21. She has an appt with neurologist on 06/18/21 for headaches, parietal and occipital shooting like pain. Headaches have improved.   Hypothyroidism: Currently she is on levothyroxine 150 mcg daily. Negative for cold/hot intolerance, tremor, or abnormal weight loss.  Lab Results  Component Value Date   TSH 3.24 06/08/2020   Review  of Systems  Constitutional:  Negative for activity change, appetite change, fatigue and fever.  HENT:  Negative for mouth sores, nosebleeds, sore throat and trouble swallowing.   Respiratory:  Negative for cough and wheezing.   Gastrointestinal:  Negative for abdominal pain and vomiting.       Negative for changes in bowel habits.  Endocrine: Negative for polydipsia, polyphagia and polyuria.  Musculoskeletal:  Negative for gait problem and myalgias.  Skin:  Negative for pallor and rash.  Neurological:  Negative for syncope, facial asymmetry and weakness.  Psychiatric/Behavioral:  Negative for confusion.   Rest see pertinent positives and negatives per HPI.  Current Outpatient Medications on File Prior to Visit  Medication Sig Dispense Refill   Alcohol Swabs (B-D SINGLE USE SWABS REGULAR) PADS Use to test blood sugar up to 3 times daily 100 each 3   allopurinol (ZYLOPRIM) 100 MG tablet Take 1 tablet (100 mg total) by mouth daily. 90 tablet 1   Ascorbic Acid (VITAMIN C) 1000 MG tablet Take 1,000 mg by mouth daily.     atorvastatin (LIPITOR) 20 MG tablet Take 1 tablet (20 mg total) by mouth daily. 90 tablet 1   Blood Glucose Monitoring Suppl (ACCU-CHEK GUIDE) w/Device KIT 1 Device by Does not apply route 3 (three) times daily. 1 kit 0   Capsaicin 0.033 % CREA Apply 1 application topically 3 (three) times daily. 56.6 g 11   cetirizine (ZYRTEC) 10 MG tablet Take 10 mg by mouth daily.     Cholecalciferol (VITAMIN D3) 50 MCG (2000 UT) capsule Take 2,000 Units by mouth daily.     Continuous Blood Gluc Sensor (DEXCOM G6 SENSOR) MISC 1 Device by Does  not apply route as directed. 9 each 3   Continuous Blood Gluc Transmit (DEXCOM G6 TRANSMITTER) MISC 1 Device by Does not apply route as directed. 1 each 3   diclofenac Sodium (VOLTAREN) 1 % GEL Apply 1 application topically daily as needed (arthritis pain/hands).     Dulaglutide (TRULICITY) 4.19 FX/9.0WI SOPN Inject 0.75 mg into the skin once a week. 6  mL 1   famotidine (PEPCID) 20 MG tablet Take 1 tablet (20 mg total) by mouth at bedtime. 90 tablet 1   FARXIGA 10 MG TABS tablet Take 1 tablet (10 mg total) by mouth daily. 90 tablet 3   fluticasone (FLONASE) 50 MCG/ACT nasal spray Place 1 spray into both nostrils daily.     gabapentin (NEURONTIN) 600 MG tablet Take 1 tablet (600 mg total) by mouth 2 (two) times daily. 60 tablet 3   glucose blood (ACCU-CHEK GUIDE) test strip Use to test blood sugar up to 3 times daily 100 each 3   insulin glargine, 1 Unit Dial, (TOUJEO SOLOSTAR) 300 UNIT/ML Solostar Pen Inject 26 Units into the skin daily. Eat a snack with protein nightly before bedtime. 15 mL 6   insulin lispro (HUMALOG KWIKPEN) 100 UNIT/ML KwikPen Max daily 15 units 15 mL 11   Insulin Pen Needle 32G X 4 MM MISC 1 Device by Does not apply route in the morning, at noon, in the evening, and at bedtime. 150 each 6   Lancets Misc. (ACCU-CHEK FASTCLIX LANCET) KIT Use to test blood sugar up to 3 times daily 1 kit 1   levothyroxine (SYNTHROID) 150 MCG tablet Take 1 tablet (150 mcg total) by mouth daily before breakfast. 90 tablet 1   magnesium oxide (MAG-OX) 400 (240 Mg) MG tablet Take 1 tablet (400 mg total) by mouth daily. 90 tablet 1   meclizine (ANTIVERT) 25 MG tablet Take 25 mg by mouth daily as needed for dizziness.      nitroGLYCERIN (NITROSTAT) 0.4 MG SL tablet Place 1 tablet (0.4 mg total) under the tongue every 5 (five) minutes as needed for chest pain. 25 tablet 2   Polyethylene Glycol 3350 (MIRALAX PO) Take by mouth daily as needed.     Propylene Glycol (SYSTANE BALANCE OP) Place 1 drop into both eyes daily as needed (for dry eyes).     Tafamidis 61 MG CAPS Take 61 mg by mouth daily. 30 capsule 6   tiZANidine (ZANAFLEX) 4 MG tablet Take 0.5-1 tablets (2-4 mg total) by mouth at bedtime as needed for muscle spasms. 30 tablet 0   trolamine salicylate (ASPERCREME) 10 % cream Apply 1 application topically as needed for muscle pain.     warfarin  (COUMADIN) 5 MG tablet TAKE 1 TO 3 TABLETS DAILY OR AS DIRECTED BY THE COUMADIN CLINIC 65 tablet 0   torsemide (DEMADEX) 20 MG tablet Take 2 tablets (40 mg total) by mouth daily. Ok to take extra dose if wt gain of 1 lb in 1 day or 5 lbs in 7 days 180 tablet 3   No current facility-administered medications on file prior to visit.   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 01/2015 Echo:  EF 55-60%, Gr 2 DD, mod LVH, mildly dil LA.   Heart disease    Hypertension    Hypothyroidism    Joint pain    Kidney problem    Lower extremity edema    Mini stroke ([place])    Non-obstructive CAD    a. 01/2015 Cardiolite: + inf wall ischemia, EF 56%;  b. 01/2015 Cath: LM nl, LAD 50m LCX min irregs, RCA dominant, 50-628m  Sleep apnea    Swallowing difficulty    Thyroid disease    Allergies  Allergen Reactions   Ace Inhibitors Other (See Comments)    unknown   Amlodipine Other (See Comments)    unknown   Atenolol Other (See Comments)    bradycardia   Avandia [Rosiglitazone] Other (See Comments)    unknown   Darvon [Propoxyphene] Other (See Comments)    unknown   Erythromycin Itching   Hydralazine Other (See Comments)    Burning in throat and chest   Hydrocodone Other (See Comments)    Hallucinations.   Levofloxacin Itching   Morphine And Related Other (See Comments)    Dizzy and hallucianation, vomiting; Willing to try low dose   Percocet [Oxycodone-Acetaminophen] Other (See Comments)    hallucination   Spironolactone Other (See Comments)    unknown   Tramadol Other (See Comments)    Unknown/does not recall reaction but does not want to take again   Social History   Socioeconomic  History   Marital status: Widowed    Spouse name: Not on file   Number of children: Not on file   Years of education: Not on file   Highest education level: Not on file  Occupational History   Occupation: Retired  Tobacco Use   Smoking status: Never   Smokeless tobacco: Never  Vaping Use   Vaping Use: Never used  Substance and Sexual Activity   Alcohol use: No   Drug use: Not on file   Sexual activity: Not Currently  Other Topics Concern   Not on file  Social History Narrative   Lives with daughter   Social Determinants of Health   Financial Resource Strain: Low Risk    Difficulty of Paying Living Expenses: Not hard at all  Food Insecurity: No Food Insecurity   Worried About RuCharity fundraisern the Last Year: Never true   RaArboriculturistn the Last Year: Never true  Transportation Needs: No Transportation Needs   Lack of Transportation (Medical): No   Lack of Transportation (Non-Medical): No  Physical Activity: Inactive   Days of Exercise per Week: 0 days   Minutes of Exercise per Session: 0 min  Stress: No Stress Concern Present   Feeling of Stress : Not at all  Social Connections: Moderately Isolated   Frequency of Communication with Friends and Family: More than three times a week   Frequency of Social Gatherings with Friends and Family: Twice a week   Attends Religious Services: More than 4 times per year   Active Member of [employer] Organizations: No   Attends ClArchivisteetings: Never   Marital Status: Widowed   Vitals:   06/09/21 0947  BP: 126/60  Pulse: (!) 107  Resp: 16  SpO2: 98%   Body mass index is 40.33 kg/m.  Physical Exam Vitals and nursing note reviewed.  Constitutional:      General: She is not in acute distress.    Appearance: She is well-developed.  HENT:  Head: Normocephalic and atraumatic.     Mouth/Throat:     Mouth: Mucous membranes are moist.     Pharynx: Oropharynx is clear.  Eyes:     Conjunctiva/sclera:  Conjunctivae normal.  Cardiovascular:     Rate and Rhythm: Normal rate. Rhythm irregular.     Pulses:          Dorsalis pedis pulses are 2+ on the right side and 2+ on the left side.     Heart sounds: No murmur heard. Pulmonary:     Effort: Pulmonary effort is normal. No respiratory distress.     Breath sounds: Normal breath sounds.  Abdominal:     Palpations: Abdomen is soft. There is no hepatomegaly or mass.     Tenderness: There is no abdominal tenderness.  Lymphadenopathy:     Cervical: No cervical adenopathy.  Skin:    General: Skin is warm.     Findings: No erythema or rash.  Neurological:     General: No focal deficit present.     Mental Status: She is alert and oriented to person, place, and time.     Cranial Nerves: No cranial nerve deficit.     Gait: Gait normal.  Psychiatric:     Comments: Well groomed, good eye contact.   ASSESSMENT AND PLAN:  [name] was seen today for hypotension.  Diagnoses and all orders for this visit: Orders Placed This Encounter  Procedures   TSH   Lab Results  Component Value Date   TSH 0.65 06/09/2021   Constipation, unspecified constipation type This problem can be causing some of her abdominal/GI symptoms. Adequate fiber and fluid intake. OTC Miralax at bedtime daily as needed.  Essential hypertension Some of her home BP's mildly low.We discussed pharmacologic and non pharmacologic options for hypotension; which can be aggravated by Torsemide. Fall precautions discussed. Orthostatic BP's: Lying 142/55 (HR 100/min), seated 140/80 HR 112/min,standing up 160/80.  BP monitor cuff does not fit well because arm shape, checked BP seated with a smaller BP cuff and 180/80. We discussed appropriate BP checking technique. Continue monitoring BP at home with adequate BP cuff and instructed to let me know about BP readings in 3-4 weeks. Keep next appt with her cardiologist. Instructed about warning signs.  Hypothyroidism, unspecified  type Stable. Continue Levothyroxine 150 mcg daily. Further recommendations according to TSH result. F/U in 12 months.  Stage 3b chronic kidney disease (HCC) Adequate hydration and low salt diet to continue as well as BP and glucose controlled. Continue Farxiga. Keep next appt with nephrologist, she has lab appt before visit.  Atrial fibrillation with RVR (HCC) Not rhythm nor rate controlled today. Continue monitoring HR at home. She is on Coumadin. Following with cardiologist.  Atherosclerosis of aorta [facility]) Seen on imaging, chest CT 02/2020. On Atorvastatin 20 mg daily.  I spent a total of 57 minutes in both face to face and non face to face activities for this visit on the date of this encounter. During this time history was obtained and documented, examination was performed, prior labs/imaging reviewed, and assessment/plan discussed.  Return in about 8 weeks (around 08/04/2021) for HTN.   Maze Corniel G. [name], MD  [facility]. [place] office.

## 2021-06-10 ENCOUNTER — Ambulatory Visit: Payer: Medicare Other | Admitting: Dietician

## 2021-06-11 ENCOUNTER — Encounter: Payer: Self-pay | Admitting: Family Medicine

## 2021-06-12 ENCOUNTER — Telehealth: Payer: Self-pay | Admitting: Physician Assistant

## 2021-06-12 NOTE — Telephone Encounter (Signed)
Patient with persistent dizziness since this morning.  Denies chest pain, shortness of breath, orthopnea, PND, syncope, lower extremity edema, room spinning, fever or chills.  Blood pressure and heart rate on separate occasion 129/69  HR 117 112/58 HR 104 129/70 HR 101  Recently her metoprolol discontinued due to soft blood pressure.  Since then is better.  Unsure why patient is dizzy.  As a trial, I have recommended to restart metoprolol tartrate at 25 mg twice daily.  Monitor blood pressure and heart rate.  Persistent worsening dizziness she will call EMS or come to ER over the weekend.  Otherwise, she will reach out to cardiology or PCP on Monday

## 2021-06-14 ENCOUNTER — Ambulatory Visit: Payer: Medicare Other | Admitting: Neurology

## 2021-06-14 DIAGNOSIS — N1832 Chronic kidney disease, stage 3b: Secondary | ICD-10-CM | POA: Diagnosis not present

## 2021-06-18 ENCOUNTER — Ambulatory Visit: Payer: Medicare Other | Admitting: Neurology

## 2021-06-18 ENCOUNTER — Encounter: Payer: Self-pay | Admitting: Neurology

## 2021-06-18 VITALS — BP 112/58 | HR 101 | Ht 62.0 in | Wt 219.0 lb

## 2021-06-18 DIAGNOSIS — R413 Other amnesia: Secondary | ICD-10-CM

## 2021-06-18 DIAGNOSIS — M542 Cervicalgia: Secondary | ICD-10-CM

## 2021-06-18 DIAGNOSIS — R269 Unspecified abnormalities of gait and mobility: Secondary | ICD-10-CM | POA: Diagnosis not present

## 2021-06-18 DIAGNOSIS — R799 Abnormal finding of blood chemistry, unspecified: Secondary | ICD-10-CM | POA: Diagnosis not present

## 2021-06-18 NOTE — Progress Notes (Signed)
Chief Complaint  Patient presents with   New Patient (Initial Visit)    Rm 13, with Ms Luana Shu, pt c/o daily headaches, pains in back of the head, numbness in both finger, pain in both feet       ASSESSMENT AND PLAN  Regina [name] is a 80 y.o. female  Neck pain, intermittent radiating pain,  worsening gait abnormality  Need to rule out cervical radiculopathy  MRI of cervical spine  Laboratory evaluation including ESR C-reactive protein,  Her symptoms is under reasonable control with Tylenol as needed    DIAGNOSTIC DATA (LABS, IMAGING, TESTING) - I reviewed patient records, labs, notes, testing and imaging myself where available.  Laboratory evaluations in February 2022, normal CBC hemoglobin of 13.3, CMP, creatinine of 2.1, lipid panel, LDL 52, normal TSH, free T4,  MEDICAL HISTORY:  Regina [name], is a 80 year old female, seen in request by   her primary care physician Dr. [name], [name] for evaluation of neck pain, headache, initial evaluation was on June 18, 2021  I reviewed and summarized the referring note. 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 Darrol Angel on March 17, 2021, suggested MRI of the neck, but not performed  She did have MRI of the brain in [place] system in February 2022, reported small likely subacute infarction at the right precentral gyri, small scattered microhemorrhage  CT angiogram of head and neck showed no large vessel disease, atherosclerotic calcification of the proximal cervical internal carotid artery, proximal LAD 20% stenosis on the left, no significant  narrowing on the right  Laboratory evaluation showed abnormal creatinine 2.1, normal TSH  She was not offered any treatment, over the past few months, her headache neck pain actually improved, noticed mild worsening gait abnormality   PHYSICAL EXAM:   Vitals:   06/18/21 1038  BP: (!) 112/58  Pulse: (!) 101  Weight: 219 lb (99.3 kg)  Height: 5' 2"  (1.575 m)   Not recorded     Body mass index is 40.06 kg/m.  PHYSICAL EXAMNIATION:  Gen: NAD, conversant, well nourised, well groomed                     Cardiovascular: Regular rate rhythm, no peripheral edema, warm, nontender. Eyes: Conjunctivae clear without exudates or hemorrhage Neck: Supple, no carotid bruits. Pulmonary: Clear to auscultation bilaterally   NEUROLOGICAL EXAM:  MENTAL STATUS: Speech:    Speech is normal; fluent and spontaneous with normal comprehension.  Cognition:     Orientation to time, place and person     Normal recent and remote memory     Normal Attention span and concentration     Normal Language, naming, repeating,spontaneous speech     Fund of knowledge   CRANIAL NERVES: CN II: Visual fields are full to confrontation. Pupils are round equal and briskly reactive to light. CN III, IV, VI: extraocular movement are normal. No ptosis. CN V: Facial sensation is intact to light touch CN VII: Face is symmetric with normal eye closure  CN VIII: Hearing is normal to causal conversation. CN IX, X: Phonation is normal. CN XI: Head turning and shoulder shrug are intact  MOTOR: There is no pronator drift  of out-stretched arms. Muscle bulk and tone are normal. Muscle strength is normal.  REFLEXES: Reflexes are 1 and symmetric at the biceps, triceps, knees, and absent at ankles. Plantar responses are flexor.  SENSORY: Length dependent decreased to light touch, pinprick and vibratory sensation to mid shin level  COORDINATION: There is no trunk or limb dysmetria noted.  GAIT/STANCE: She needs  push-up to get up from seated position, wide-based, unsteady gait  REVIEW OF SYSTEMS:  Full 14 system review of systems performed and notable only for as above All other review of systems were negative.   ALLERGIES: Allergies  Allergen Reactions   Ace Inhibitors Other (See Comments)    unknown   Amlodipine Other (See Comments)    unknown   Atenolol Other (See Comments)    bradycardia   Avandia [Rosiglitazone] Other (See Comments)    unknown   Darvon [Propoxyphene] Other (See Comments)    unknown   Erythromycin Itching   Hydralazine Other (See Comments)    Burning in throat and chest   Hydrocodone Other (See Comments)    Hallucinations.   Levofloxacin Itching   Morphine And Related Other (See Comments)    Dizzy and hallucianation, vomiting; Willing to try low dose   Percocet [Oxycodone-Acetaminophen] Other (See Comments)    hallucination   Spironolactone Other (See Comments)    unknown   Tramadol Other (See Comments)    Unknown/does not recall reaction but does not want to take again    HOME MEDICATIONS: Current Outpatient Medications  Medication Sig Dispense Refill   Alcohol Swabs (B-D SINGLE USE SWABS REGULAR) PADS Use to test blood sugar up to 3 times daily 100 each 3   allopurinol (ZYLOPRIM) 100 MG tablet Take 1 tablet (100 mg total) by mouth daily. 90 tablet 1   Ascorbic Acid (VITAMIN C) 1000 MG tablet Take 1,000 mg by mouth daily.     atorvastatin (LIPITOR) 20 MG tablet Take 1 tablet (20 mg total) by mouth daily. 90 tablet 1   Blood Glucose Monitoring Suppl (ACCU-CHEK GUIDE) w/Device KIT 1 Device by Does not apply route 3 (three) times daily. 1 kit 0   Capsaicin 0.033 % CREA Apply 1 application topically 3 (three) times daily. 56.6 g 11   cetirizine (ZYRTEC) 10 MG tablet Take 10 mg by mouth daily.     Cholecalciferol (VITAMIN D3) 50 MCG (2000 UT) capsule Take 2,000 Units by mouth daily.     Continuous Blood Gluc Sensor (DEXCOM G6 SENSOR) MISC 1 Device by Does not  apply route as directed. 9 each 3   Continuous Blood Gluc Transmit (DEXCOM G6 TRANSMITTER) MISC 1 Device by Does not apply route as directed. 1 each 3   diclofenac Sodium (VOLTAREN) 1 % GEL Apply 1 application topically daily as needed (arthritis pain/hands).     famotidine (PEPCID) 20 MG tablet Take 1 tablet (20 mg total) by mouth at bedtime. 90 tablet 1   FARXIGA 10 MG TABS tablet Take 1 tablet (10 mg total) by mouth daily. 90 tablet 3   fluticasone (FLONASE) 50 MCG/ACT nasal spray Place 1 spray into both nostrils daily.     gabapentin (NEURONTIN) 600 MG tablet Take 1 tablet (600 mg total) by mouth 2 (two) times daily. 60 tablet 3   glucose blood (ACCU-CHEK GUIDE) test strip Use to test blood sugar up to 3 times daily 100 each 3   insulin glargine, 1 Unit Dial, (TOUJEO SOLOSTAR) 300 UNIT/ML Solostar Pen Inject 26 Units into the skin daily.  Eat a snack with protein nightly before bedtime. 15 mL 6   insulin lispro (HUMALOG KWIKPEN) 100 UNIT/ML KwikPen Max daily 15 units 15 mL 11   Insulin Pen Needle 32G X 4 MM MISC 1 Device by Does not apply route in the morning, at noon, in the evening, and at bedtime. 150 each 6   Lancets Misc. (ACCU-CHEK FASTCLIX LANCET) KIT Use to test blood sugar up to 3 times daily 1 kit 1   levothyroxine (SYNTHROID) 150 MCG tablet Take 1 tablet (150 mcg total) by mouth daily before breakfast. 90 tablet 1   magnesium oxide (MAG-OX) 400 (240 Mg) MG tablet Take 1 tablet (400 mg total) by mouth daily. 90 tablet 1   meclizine (ANTIVERT) 25 MG tablet Take 25 mg by mouth daily as needed for dizziness.      nitroGLYCERIN (NITROSTAT) 0.4 MG SL tablet Place 1 tablet (0.4 mg total) under the tongue every 5 (five) minutes as needed for chest pain. 25 tablet 2   Polyethylene Glycol 3350 (MIRALAX PO) Take by mouth daily as needed.     Propylene Glycol (SYSTANE BALANCE OP) Place 1 drop into both eyes daily as needed (for dry eyes).     Tafamidis 61 MG CAPS Take 61 mg by mouth daily. 30  capsule 6   tiZANidine (ZANAFLEX) 4 MG tablet Take 0.5-1 tablets (2-4 mg total) by mouth at bedtime as needed for muscle spasms. 30 tablet 0   trolamine salicylate (ASPERCREME) 10 % cream Apply 1 application topically as needed for muscle pain.     warfarin (COUMADIN) 5 MG tablet TAKE 1 TO 3 TABLETS DAILY OR AS DIRECTED BY THE COUMADIN CLINIC 65 tablet 0   torsemide (DEMADEX) 20 MG tablet Take 2 tablets (40 mg total) by mouth daily. Ok to take extra dose if wt gain of 1 lb in 1 day or 5 lbs in 7 days 180 tablet 3   No current facility-administered medications for this visit.    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 01/2015 Echo: EF 55-60%, Gr 2 DD, mod LVH, mildly dil LA.   Heart disease    Hypertension    Hypothyroidism    Joint pain    Kidney problem    Lower extremity edema    Mini stroke ([place])    Non-obstructive CAD    a. 01/2015 Cardiolite: + inf wall ischemia, EF 56%;  b. 01/2015 Cath: LM nl, LAD 94m LCX min irregs, RCA dominant, 50-679m  Sleep apnea    Swallowing difficulty    Thyroid disease     PAST SURGICAL HISTORY: Past Surgical History:  Procedure Laterality Date   ABDOMINAL HYSTERECTOMY     BUBBLE STUDY  07/01/2020   Procedure: BUBBLE STUDY;  Surgeon: AcElouise MunroeMD;  [facility];;   BUBBLE STUDY  08/05/2020   Procedure: BUBBLE STUDY;  Surgeon: O'Geralynn RileMD;  [facility] [facility];;  done with definity    CARDIAC CATHETERIZATION     ESOPHAGOGASTRODUODENOSCOPY     a. 01/2015 EGD: patent esophagus.   ESOPHAGOGASTRODUODENOSCOPY N/A 01/20/2015  Procedure:  ESOPHAGOGASTRODUODENOSCOPY (EGD);  Surgeon: Carol Ada, MD;  Location[facility] ENDOSCOPY;  Service: Endoscopy;  Laterality: N/A;   HAND SURGERY     JOINT REPLACEMENT     reports history bilateral TKA and right TSA   LEFT HEART CATHETERIZATION WITH CORONARY ANGIOGRAM N/A 01/19/2015   TEE WITHOUT CARDIOVERSION  05/04/2020   TEE WITHOUT CARDIOVERSION N/A 05/05/2020   Procedure: TRANSESOPHAGEAL ECHOCARDIOGRAM (TEE);  Surgeon: Pixie Casino, MD;  Location: [facility] ENDOSCOPY;  Service: Cardiovascular;  Laterality: N/A;   TEE WITHOUT CARDIOVERSION N/A 07/01/2020   Procedure: TRANSESOPHAGEAL ECHOCARDIOGRAM (TEE);  Surgeon: Elouise Munroe, MD;  Location: [place];  Service: Cardiovascular;  Laterality: N/A;   TEE WITHOUT CARDIOVERSION N/A 08/05/2020   Procedure: TRANSESOPHAGEAL ECHOCARDIOGRAM (TEE);  Surgeon: Geralynn Rile, MD;  Location: [facility] ENDOSCOPY;  Service: Cardiovascular;  Laterality: N/A;   THYROIDECTOMY      FAMILY HISTORY: Family History  Problem Relation Age of Onset   Heart disease Mother    Hypertension Mother    Cancer Father    Alcoholism Father     SOCIAL HISTORY: Social History   Socioeconomic History   Marital status: Widowed    Spouse name: Not on file   Number of children: Not on file   Years of education: Not on file   Highest education level: Not on file  Occupational History   Occupation: Retired  Tobacco Use   Smoking status: Never   Smokeless tobacco: Never  Vaping Use   Vaping Use: Never used  Substance and Sexual Activity   Alcohol use: No   Drug use: Not on file   Sexual activity: Not Currently  Other Topics Concern   Not on file  Social History Narrative   Lives with daughter   Social Determinants of Health   Financial Resource Strain: Low Risk    Difficulty of Paying Living Expenses: Not hard at all  Food Insecurity: No Food Insecurity   Worried About Charity fundraiser in the Last Year: Never true   Arboriculturist in the Last Year:  Never true  Transportation Needs: No Transportation Needs   Lack of Transportation (Medical): No   Lack of Transportation (Non-Medical): No  Physical Activity: Inactive   Days of Exercise per Week: 0 days   Minutes of Exercise per Session: 0 min  Stress: No Stress Concern Present   Feeling of Stress : Not at all  Social Connections: Moderately Isolated   Frequency of Communication with Friends and Family: More than three times a week   Frequency of Social Gatherings with Friends and Family: Twice a week   Attends Religious Services: More than 4 times per year   Active Member of [employer] or Organizations: No   Attends Archivist Meetings: Never   Marital Status: Widowed  Intimate Partner Violence: Not At Risk   Fear of Current or Ex-Partner: No   Emotionally Abused: No   Physically Abused: No   Sexually Abused: No      Marcial Pacas, M.D. Ph.D.  [facility] [address], [place], [place] [address] Ph: [phone] Fax: [phone]  CC:  [name], [name], MD [place],  [place] [address]  [name], [name], MD

## 2021-06-19 LAB — SEDIMENTATION RATE: Sed Rate: 58 mm/hr — ABNORMAL HIGH (ref 0–40)

## 2021-06-19 LAB — C-REACTIVE PROTEIN: CRP: 33 mg/L — ABNORMAL HIGH (ref 0–10)

## 2021-06-19 LAB — VITAMIN B12: Vitamin B-12: 810 pg/mL (ref 232–1245)

## 2021-06-19 LAB — RPR: RPR Ser Ql: NONREACTIVE

## 2021-06-19 LAB — HGB A1C W/O EAG: Hgb A1c MFr Bld: 7.2 % — ABNORMAL HIGH (ref 4.8–5.6)

## 2021-06-22 ENCOUNTER — Encounter (HOSPITAL_COMMUNITY): Payer: Self-pay

## 2021-06-22 ENCOUNTER — Telehealth (HOSPITAL_COMMUNITY): Payer: Self-pay

## 2021-06-22 ENCOUNTER — Telehealth: Payer: Self-pay | Admitting: Neurology

## 2021-06-22 DIAGNOSIS — N1832 Chronic kidney disease, stage 3b: Secondary | ICD-10-CM | POA: Diagnosis not present

## 2021-06-22 DIAGNOSIS — M542 Cervicalgia: Secondary | ICD-10-CM

## 2021-06-22 DIAGNOSIS — Z23 Encounter for immunization: Secondary | ICD-10-CM | POA: Diagnosis not present

## 2021-06-22 DIAGNOSIS — I509 Heart failure, unspecified: Secondary | ICD-10-CM | POA: Diagnosis not present

## 2021-06-22 DIAGNOSIS — I2510 Atherosclerotic heart disease of native coronary artery without angina pectoris: Secondary | ICD-10-CM | POA: Diagnosis not present

## 2021-06-22 DIAGNOSIS — E854 Organ-limited amyloidosis: Secondary | ICD-10-CM | POA: Diagnosis not present

## 2021-06-22 DIAGNOSIS — I639 Cerebral infarction, unspecified: Secondary | ICD-10-CM | POA: Diagnosis not present

## 2021-06-22 DIAGNOSIS — I129 Hypertensive chronic kidney disease with stage 1 through stage 4 chronic kidney disease, or unspecified chronic kidney disease: Secondary | ICD-10-CM | POA: Diagnosis not present

## 2021-06-22 DIAGNOSIS — I43 Cardiomyopathy in diseases classified elsewhere: Secondary | ICD-10-CM | POA: Diagnosis not present

## 2021-06-22 DIAGNOSIS — I480 Paroxysmal atrial fibrillation: Secondary | ICD-10-CM | POA: Diagnosis not present

## 2021-06-22 DIAGNOSIS — E1122 Type 2 diabetes mellitus with diabetic chronic kidney disease: Secondary | ICD-10-CM | POA: Diagnosis not present

## 2021-06-22 DIAGNOSIS — E785 Hyperlipidemia, unspecified: Secondary | ICD-10-CM | POA: Diagnosis not present

## 2021-06-22 NOTE — Telephone Encounter (Signed)
Attempted to contact pt in regards to Pulmonary Rehab. LM on VM  Mailed letter 

## 2021-06-22 NOTE — Telephone Encounter (Signed)
Orders Placed This Encounter  Procedures   C-reactive protein   Sedimentation rate   ANA w/Reflex if Positive   CK

## 2021-06-22 NOTE — Telephone Encounter (Signed)
Please call patient, laboratory evaluation showed elevated ESR 58, C-reactive protein 33, this could indicate systemic inflammatory process, I have ordered repeat test,  A1c of 7.2,  Rest of the laboratory evaluation showed no significant abnormalities. 

## 2021-06-22 NOTE — Telephone Encounter (Signed)
Left message for a return call

## 2021-06-22 NOTE — Telephone Encounter (Signed)
Pt insurance is active and benefits verified through [facility] Medicare. Co-pay $20.00, DED $0.00/$0.00 met, out of pocket $3,900.00/$851.55 met, co-insurance 0%. No pre-authorization required. Kathy/[employer] Medicare, 06/22/21 @ 3:51PM, ZVG#[id number]   Will contact patient to see if she is interested in the Pulmonary Rehab Program.

## 2021-06-22 NOTE — Telephone Encounter (Signed)
[employer] medicare order sent to GI. NPR they will reach out to the patient to schedule.  

## 2021-06-22 NOTE — Addendum Note (Signed)
Addended by: Marcial Pacas on: 06/22/2021 09:46 AM   Modules accepted: Orders

## 2021-06-22 NOTE — Telephone Encounter (Addendum)
I spoke to the patient and provided her with the results. She is agreeable to come in for repeat labs. She will follow up with her endocrinologist for the elevated A1C.

## 2021-06-28 ENCOUNTER — Other Ambulatory Visit (INDEPENDENT_AMBULATORY_CARE_PROVIDER_SITE_OTHER): Payer: Self-pay

## 2021-06-28 DIAGNOSIS — M542 Cervicalgia: Secondary | ICD-10-CM | POA: Diagnosis not present

## 2021-06-28 DIAGNOSIS — Z0289 Encounter for other administrative examinations: Secondary | ICD-10-CM

## 2021-06-29 ENCOUNTER — Telehealth: Payer: Self-pay | Admitting: Neurology

## 2021-06-29 DIAGNOSIS — M353 Polymyalgia rheumatica: Secondary | ICD-10-CM

## 2021-06-29 LAB — C-REACTIVE PROTEIN: CRP: 23 mg/L — ABNORMAL HIGH (ref 0–10)

## 2021-06-29 LAB — ANA W/REFLEX IF POSITIVE: Anti Nuclear Antibody (ANA): NEGATIVE

## 2021-06-29 LAB — CK: Total CK: 181 U/L (ref 32–182)

## 2021-06-29 LAB — SEDIMENTATION RATE: Sed Rate: 48 mm/hr — ABNORMAL HIGH (ref 0–40)

## 2021-06-29 NOTE — Telephone Encounter (Signed)
I tried to call patient without success, left message for her to call back.  Please call her again, repeat ESR, CRP remain elevated, this could indicate polymyalgia rheumatica, ask her if she has any signs of active infection such as UTI, upper respiratory infection,   Also asking her if she has any symptoms of diffuse body achy pain, she does has diabetes, most recent A1c June 18, 2021 was 7.2  If she has significant body achy pain, may consider low-dose of prednisone 10 mg daily, followed by tapering dose

## 2021-06-30 DIAGNOSIS — M353 Polymyalgia rheumatica: Secondary | ICD-10-CM | POA: Insufficient documentation

## 2021-06-30 MED ORDER — DULOXETINE HCL 60 MG PO CPEP: 60.0000 mg | ORAL_CAPSULE | Freq: Every day | ORAL | 11 refills | Status: DC

## 2021-06-30 MED ORDER — PREDNISONE 5 MG PO TABS: ORAL_TABLET | ORAL | 3 refills | Status: DC

## 2021-06-30 NOTE — Telephone Encounter (Signed)
I called the patient and moved her appt to 08/11/21.

## 2021-06-30 NOTE — Telephone Encounter (Signed)
I have called patient, multiple repeat test showed evidence of elevated ESR, C-reactive protein, she does complains of diffuse body achy pain, suggestive of polymyalgia rheumatica  Start prednisone 79m 2 tabs qam for 2 weeks, then one tab every morning,   Move up her appointment to the end of October,

## 2021-06-30 NOTE — Addendum Note (Signed)
Addended by: Marcial Pacas on: 06/30/2021 08:44 AM   Modules accepted: Orders

## 2021-07-01 ENCOUNTER — Telehealth: Payer: Self-pay | Admitting: Cardiovascular Disease

## 2021-07-01 NOTE — Telephone Encounter (Signed)
Pt informed of providers recommendations. Pt verbalized understanding. No further questions . She will CB with any questions/swelling

## 2021-07-01 NOTE — Telephone Encounter (Signed)
Patient returning call.

## 2021-07-01 NOTE — Telephone Encounter (Signed)
Patient would like Dr. Audie Box to look at her recent lab work don by Dr. Krista Blue. She says she was told she had "elevated inflation or something" and Dr. Krista Blue wants her to take steroids. She would like to know if Dr. Audie Box thinks this is okay She also says she has an MRI on her neck scheduled the 20th.

## 2021-07-01 NOTE — Telephone Encounter (Signed)
Called patient, LVM advising I would send a message over to Dr.O'Neal to review lab work and would call back with recommendations. Left call back number if she had any other questions or concerns.

## 2021-07-02 ENCOUNTER — Telehealth: Payer: Self-pay

## 2021-07-02 NOTE — Telephone Encounter (Signed)
Patient called and is starting Prednisone for 2 weeks.  I moved up her Coumadin appointment.

## 2021-07-06 ENCOUNTER — Other Ambulatory Visit: Payer: Self-pay

## 2021-07-06 ENCOUNTER — Ambulatory Visit
Admission: RE | Admit: 2021-07-06 | Discharge: 2021-07-06 | Disposition: A | Discharge status: Home / Self Care | Payer: Medicare Other | Source: Ambulatory Visit | Attending: Neurology | Admitting: Neurology

## 2021-07-06 ENCOUNTER — Other Ambulatory Visit: Payer: Self-pay | Admitting: Family Medicine

## 2021-07-06 DIAGNOSIS — R269 Unspecified abnormalities of gait and mobility: Secondary | ICD-10-CM | POA: Diagnosis not present

## 2021-07-06 DIAGNOSIS — R413 Other amnesia: Secondary | ICD-10-CM

## 2021-07-06 DIAGNOSIS — M542 Cervicalgia: Secondary | ICD-10-CM

## 2021-07-07 ENCOUNTER — Telehealth (HOSPITAL_COMMUNITY): Payer: Self-pay

## 2021-07-07 NOTE — Telephone Encounter (Signed)
Pt called and state she is unable to participate in PR at this time. She is unstable on her feet and her kids do not like her going anywhere by herself.   Closed referral

## 2021-07-09 ENCOUNTER — Other Ambulatory Visit: Payer: Self-pay

## 2021-07-09 ENCOUNTER — Ambulatory Visit (INDEPENDENT_AMBULATORY_CARE_PROVIDER_SITE_OTHER): Payer: Medicare Other

## 2021-07-09 DIAGNOSIS — I4819 Other persistent atrial fibrillation: Secondary | ICD-10-CM

## 2021-07-09 DIAGNOSIS — Z7901 Long term (current) use of anticoagulants: Secondary | ICD-10-CM

## 2021-07-09 LAB — POCT INR: INR: 2.5 (ref 2.0–3.0)

## 2021-07-09 NOTE — Patient Instructions (Signed)
Continue taking 1/2 tablet daily except 1 tablet each Sunday.  Repeat INR in 6 weeks

## 2021-07-11 NOTE — Progress Notes (Signed)
Cardiology Office Note:   Date:  07/12/2021  NAME:  Regina [name]    MRN: [id number] DOB:  1940-11-12   PCP:  [name], [name], MD  Cardiologist:  Evalina Field, MD  Electrophysiologist:  None   Referring MD: [name], [name], MD   Chief Complaint  Patient presents with   Follow-up   History of Present Illness:   Regina [name] is a 80 y.o. female with a hx of hATTR, Afib, DM, CAD, CKD who presents for follow-up. Was having dizziness and restarted metoprolol. Diagnosed with PMR and started on prednisone.  She reports she is doing well.  BP 118/64.  Suffered a mechanical fall recently.  This appears to not have been related to her heart condition.  Her weights are stable around 220 pounds.  She is watching her salt intake.  She is unable to do cardiac rehab but doing as much she can around the house.  Seems to be doing really well.  Remains in atrial fibrillation but rates are controlled.  Started back on metoprolol.  Denies any major symptoms today in office.  Needs a refill on her metoprolol.  Overall doing well.  Problem List 1.  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 Persistent Afib -LAA slugde vs thrombus 05/05/2020  -persistent sludge 07/01/2020 -LAA thrombus persistent 08/05/2020 -on warfarin 3. DM -A1c 6.8 4. HLD -T chol 117, HDL 56, LDL 49, triglycerides 54 5. HTN 6. Non-obstructive CAD -LHC 01/19/2015 [facility] -50% mid RCA and 50% LAD -MPI 08/14/2020 -> normal 7. OSA 8. CKD 3/4 9. CVA -11/2020 @ [facility]  10. 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 01/2015 Echo: EF 55-60%, Gr 2 DD, mod LVH, mildly dil LA.   Heart disease    Hypertension    Hypothyroidism    Joint pain    Kidney problem    Lower extremity edema    Mini stroke ([place])    Non-obstructive CAD    a. 01/2015 Cardiolite: + inf wall ischemia, EF 56%;  b. 01/2015 Cath: LM nl, LAD 27m LCX min irregs, RCA dominant, 50-643m  Sleep apnea    Swallowing difficulty    Thyroid disease     Past Surgical History: Past Surgical History:  Procedure Laterality Date   ABDOMINAL HYSTERECTOMY     BUBBLE STUDY  07/01/2020   Procedure: BUBBLE STUDY;  Surgeon: AcElouise MunroeMD;  [facility];;   BUBBLE STUDY  08/05/2020   Procedure: BUBBLE STUDY;  Surgeon: O'Geralynn RileMD;  [facility]: Cardi[facility];;  done with definity    CARDIAC CATHETERIZATION     ESOPHAGOGASTRODUODENOSCOPY     a. 01/2015 EGD: patent esophagus.   ESOPHAGOGASTRODUODENOSCOPY N/A 01/20/2015   Procedure: ESOPHAGOGASTRODUODENOSCOPY (EGD);  Surgeon: PaCarol AdaMD;  [facility];  Service: Endoscopy;  Laterality: N/A;   HAND SURGERY     JOINT REPLACEMENT     reports history  bilateral TKA and right TSA   LEFT HEART CATHETERIZATION WITH CORONARY ANGIOGRAM N/A 01/19/2015   TEE WITHOUT CARDIOVERSION  05/04/2020   TEE WITHOUT CARDIOVERSION N/A 05/05/2020   Procedure: TRANSESOPHAGEAL ECHOCARDIOGRAM (TEE);  Surgeon: Pixie Casino, MD;  [facility] ENDOSCOPY;  Service: Cardiovascular;  Laterality: N/A;   TEE WITHOUT CARDIOVERSION N/A 07/01/2020   Procedure: TRANSESOPHAGEAL ECHOCARDIOGRAM (TEE);  Surgeon: Elouise Munroe, MD;  Location: [place];  Service: Cardiovascular;  Laterality: N/A;   TEE WITHOUT CARDIOVERSION N/A 08/05/2020   Procedure: TRANSESOPHAGEAL ECHOCARDIOGRAM (TEE);  Surgeon: Geralynn Rile, MD;  Location: [place];  Service:  Cardiovascular;  Laterality: N/A;   THYROIDECTOMY      Current Medications: Current Meds  Medication Sig   Alcohol Swabs (B-D SINGLE USE SWABS REGULAR) PADS Use to test blood sugar up to 3 times daily   allopurinol (ZYLOPRIM) 100 MG tablet Take 1 tablet (100 mg total) by mouth daily.   Ascorbic Acid (VITAMIN C) 1000 MG tablet Take 1,000 mg by mouth daily.   atorvastatin (LIPITOR) 20 MG tablet Take 1 tablet (20 mg total) by mouth daily.   Blood Glucose Monitoring Suppl (ACCU-CHEK GUIDE) w/Device KIT 1 Device by Does not apply route 3 (three) times daily.   Capsaicin 0.033 % CREA Apply 1 application topically 3 (three) times daily.   cetirizine (ZYRTEC) 10 MG tablet Take 10 mg by mouth daily.   Cholecalciferol (VITAMIN D3) 50 MCG (2000 UT) capsule Take 2,000 Units by mouth daily.   Continuous Blood Gluc Sensor (DEXCOM G6 SENSOR) MISC 1 Device by Does not apply route as directed.   Continuous Blood Gluc Transmit (DEXCOM G6 TRANSMITTER) MISC 1 Device by Does not apply route as directed.   diclofenac Sodium (VOLTAREN) 1 % GEL Apply 1 application topically daily as needed (arthritis pain/hands).   famotidine (PEPCID) 20 MG tablet Take 1 tablet (20 mg total) by mouth at bedtime.   FARXIGA 10 MG TABS tablet Take 1 tablet (10 mg total) by mouth daily.   fluticasone (FLONASE) 50 MCG/ACT nasal spray Place 1 spray into both nostrils daily.   gabapentin (NEURONTIN) 600 MG tablet TAKE ONE TABLET BY MOUTH EVERY MORNING and TAKE ONE TABLET BY MOUTH EVERYDAY AT BEDTIME   glucose blood (ACCU-CHEK GUIDE) test strip Use to test blood sugar up to 3 times daily   insulin glargine, 1 Unit Dial, (TOUJEO SOLOSTAR) 300 UNIT/ML Solostar Pen Inject 26 Units into the skin daily. Eat a snack with protein nightly before bedtime.   insulin lispro (HUMALOG KWIKPEN) 100 UNIT/ML KwikPen Max daily 15 units   Insulin Pen Needle 32G X 4 MM MISC 1 Device by Does not apply route in the morning, at noon, in the evening, and at  bedtime.   Lancets Misc. (ACCU-CHEK FASTCLIX LANCET) KIT Use to test blood sugar up to 3 times daily   levothyroxine (SYNTHROID) 150 MCG tablet Take 1 tablet (150 mcg total) by mouth daily before breakfast.   magnesium oxide (MAG-OX) 400 (240 Mg) MG tablet Take 1 tablet (400 mg total) by mouth daily.   meclizine (ANTIVERT) 25 MG tablet Take 25 mg by mouth daily as needed for dizziness.    metoprolol succinate (TOPROL-XL) 25 MG 24 hr tablet Take 1 tablet (25 mg total) by mouth daily. Take with or immediately following a meal.   nitroGLYCERIN (NITROSTAT) 0.4 MG SL tablet Place 1 tablet (0.4 mg total) under the tongue every 5 (five) minutes as needed for chest pain.   Polyethylene Glycol 3350 (  MIRALAX PO) Take by mouth daily as needed.   predniSONE (DELTASONE) 5 MG tablet 2 tabs qam x 2weeks, then 1 tab qam   Propylene Glycol (SYSTANE BALANCE OP) Place 1 drop into both eyes daily as needed (for dry eyes).   Tafamidis 61 MG CAPS Take 61 mg by mouth daily.   tiZANidine (ZANAFLEX) 4 MG tablet Take 0.5-1 tablets (2-4 mg total) by mouth at bedtime as needed for muscle spasms.   trolamine salicylate (ASPERCREME) 10 % cream Apply 1 application topically as needed for muscle pain.   warfarin (COUMADIN) 5 MG tablet TAKE 1 TO 3 TABLETS DAILY OR AS DIRECTED BY THE COUMADIN CLINIC     Allergies:    Ace inhibitors, Amlodipine, Atenolol, Avandia [rosiglitazone], Darvon [propoxyphene], Erythromycin, Hydralazine, Hydrocodone, Levofloxacin, Morphine and related, Percocet [oxycodone-acetaminophen], Spironolactone, and Tramadol   Social History: Social History   Socioeconomic History   Marital status: Widowed    Spouse name: Not on file   Number of children: Not on file   Years of education: Not on file   Highest education level: Not on file  Occupational History   Occupation: Retired  Tobacco Use   Smoking status: Never   Smokeless tobacco: Never  Vaping Use   Vaping Use: Never used  Substance and  Sexual Activity   Alcohol use: No   Drug use: Not on file   Sexual activity: Not Currently  Other Topics Concern   Not on file  Social History Narrative   Lives with daughter   Social Determinants of Health   Financial Resource Strain: Low Risk    Difficulty of Paying Living Expenses: Not hard at all  Food Insecurity: No Food Insecurity   Worried About Charity fundraiser in the Last Year: Never true   Arboriculturist in the Last Year: Never true  Transportation Needs: No Transportation Needs   Lack of Transportation (Medical): No   Lack of Transportation (Non-Medical): No  Physical Activity: Inactive   Days of Exercise per Week: 0 days   Minutes of Exercise per Session: 0 min  Stress: No Stress Concern Present   Feeling of Stress : Not at all  Social Connections: Moderately Isolated   Frequency of Communication with Friends and Family: More than three times a week   Frequency of Social Gatherings with Friends and Family: Twice a week   Attends Religious Services: More than 4 times per year   Active Member of [employer] or Organizations: No   Attends Archivist Meetings: Never   Marital Status: Widowed     Family History: The patient's family history includes Alcoholism in her father; Cancer in her father; Heart disease in her mother; Hypertension in her mother.  ROS:   All other ROS reviewed and negative. Pertinent positives noted in the HPI.     EKGs/Labs/Other Studies Reviewed:   The following studies were personally reviewed by me today:  TTE 04/21/2021  1. Left ventricular ejection fraction, by estimation, is 45 to 50%. The  left ventricle has mildly decreased function. The left ventricle  demonstrates global hypokinesis. There is moderate concentric left  ventricular hypertrophy. Left ventricular  diastolic function could not be evaluated.   2. Right ventricular systolic function is moderately reduced. The right  ventricular size is normal. Tricuspid  regurgitation signal is inadequate  for assessing PA pressure.   3. Left atrial size was moderately dilated.   4. The mitral valve is grossly normal. Mild mitral valve regurgitation.  No evidence of mitral stenosis.   5. The aortic valve is tricuspid. Aortic valve regurgitation is not  visualized. No aortic stenosis is present.   6. The inferior vena cava is normal in size with greater than 50%  respiratory variability, suggesting right atrial pressure of 3 mmHg.    Recent Labs: 02/15/2021: Magnesium 2.2 04/02/2021: Hemoglobin 12.0; Platelets 249 05/10/2021: BUN 51; Creatinine, Ser 2.30; Potassium 4.2; Sodium 145 06/09/2021: TSH 0.65   Recent Lipid Panel    Component Value Date/Time   CHOL 117 03/24/2020 1428   TRIG 54 03/24/2020 1428   HDL 56 03/24/2020 1428   CHOLHDL 2 05/22/2019 1631   VLDL 23.8 05/22/2019 1631   LDLCALC 49 03/24/2020 1428    Physical Exam:   VS:  BP 118/64   Pulse 83   Ht 5' 2.4" (1.585 m)   Wt 221 lb 6.4 oz (100.4 kg)   SpO2 99%   BMI 39.98 kg/m    Wt Readings from Last 3 Encounters:  07/12/21 221 lb 6.4 oz (100.4 kg)  06/18/21 219 lb (99.3 kg)  06/09/21 220 lb 8 oz (100 kg)    General: Well nourished, well developed, in no acute distress Head: Atraumatic, normal size  Eyes: PEERLA, EOMI  Neck: Supple, no JVD Endocrine: No thryomegaly Cardiac: Normal S1, S2; irregular rhythm, no murmurs rubs or gallops Lungs: Clear to auscultation bilaterally, no wheezing, rhonchi or rales  Abd: Soft, nontender, no hepatomegaly  Ext: No edema, pulses 2+ Musculoskeletal: No deformities, BUE and BLE strength normal and equal Skin: Warm and dry, no rashes   Neuro: Alert and oriented to person, place, time, and situation, CNII-XII grossly intact, no focal deficits  Psych: Normal mood and affect   ASSESSMENT:   Regina [name] is a 80 y.o. female who presents for the following: 1. Hereditary cardiac amyloidosis (HCC)   2. Persistent atrial fibrillation ([place])    3. Long term (current) use of anticoagulants   4. Chronic combined systolic and diastolic CHF (congestive heart failure) ([place])   5. Coronary artery disease involving native coronary artery of native heart with other form of angina pectoris ([place])   6. Primary hypertension     PLAN:   1. Hereditary cardiac amyloidosis (HCC) -hATTR cardiac amyloid (Val142Ile).  She seems to be doing well.  Euvolemic on exam.  EF was low but has recovered to 45 to 50%.  We will continue with medical therapy.  She is on tafamidis 61 mg daily.  No issues.  She is on torsemide 40 mg daily.  She may take an extra tablet as needed but seems to be doing well.  2. Persistent atrial fibrillation (HCC) -Rate controlled.  Has had persistent left atrial appendage thrombus.  We tried 3 times to cardiovert her however this was there.  We switched to Coumadin and still did not have resolution of thrombus.  Given that she is doing well I see no need to get her back in rhythm.  We will just continue with Coumadin INR goal 2-3.  She will continue with metoprolol succinate 25 mg daily for rate control.  3. Long term (current) use of anticoagulants -On Coumadin for A. fib.  See discussion above.  4. Chronic combined systolic and diastolic CHF (congestive heart failure) ([place]) -EF actually has recovered 45 to 50%.  I suspect a lot of this has to do with propofol use during TEE/cardioversion.  She cannot be on GDMT due to CKD stage IV.  We will continue with  rate control as well as Coumadin.  5. Coronary artery disease involving native coronary artery of native heart with other form of angina pectoris ([place]) -non-obstructive CAD on cath. On statin.   6. Primary hypertension -well controlled.   Disposition: Return if symptoms worsen or fail to improve.  Medication Adjustments/Labs and Tests Ordered: Current medicines are reviewed at length with the patient today.  Concerns regarding medicines are outlined above.  No orders of the  defined types were placed in this encounter.  Meds ordered this encounter  Medications   metoprolol succinate (TOPROL-XL) 25 MG 24 hr tablet    Sig: Take 1 tablet (25 mg total) by mouth daily. Take with or immediately following a meal.    Dispense:  90 tablet    Refill:  3    Patient Instructions  Medication Instructions:  START: METOPROLOL SUCCINATE 25m ONCE DAILY  *If you need a refill on your cardiac medications before your next appointment, please call your pharmacy*  Follow-Up: [facility] and [facility] our [facility].  As part of our continuing mission to provide you with exceptional heart care, we have created designated Provider Care Teams.  These Care Teams include your primary Cardiologist (physician) and Advanced Practice Providers (APPs -  Physician Assistants and Nurse Practitioners) who all work together to provide you with the care you need, when you need it.  Your next appointment:   AS SCHEDULED   The format for your next appointment:   In Person  Provider:   WEleonore Chiquito MD  Other Instructions PLEASE LET [place]NOW IF ANYTHING CHANGES. NUMBER TO THE OFFICE IS ([phone]    Time Spent with Patient: I have spent a total of 35 minutes with patient reviewing hospital notes, telemetry, EKGs, labs and examining the patient as well as establishing an assessment and plan that was discussed with the patient.  > 50% of time was spent in direct patient care.  Signed, WAddison Naegeli OAudie Box MD, [place] [address] [place] [phone] 07/12/2021 3:26 PM

## 2021-07-12 ENCOUNTER — Ambulatory Visit: Payer: Medicare Other | Admitting: Cardiovascular Disease

## 2021-07-12 ENCOUNTER — Encounter: Payer: Self-pay | Admitting: Cardiovascular Disease

## 2021-07-12 ENCOUNTER — Telehealth: Payer: Self-pay

## 2021-07-12 ENCOUNTER — Other Ambulatory Visit: Payer: Self-pay

## 2021-07-12 VITALS — BP 118/64 | HR 83 | Ht 62.4 in | Wt 221.4 lb

## 2021-07-12 DIAGNOSIS — E854 Organ-limited amyloidosis: Secondary | ICD-10-CM | POA: Diagnosis not present

## 2021-07-12 DIAGNOSIS — I10 Essential (primary) hypertension: Secondary | ICD-10-CM | POA: Diagnosis not present

## 2021-07-12 DIAGNOSIS — I25118 Atherosclerotic heart disease of native coronary artery with other forms of angina pectoris: Secondary | ICD-10-CM

## 2021-07-12 DIAGNOSIS — Z7901 Long term (current) use of anticoagulants: Secondary | ICD-10-CM

## 2021-07-12 DIAGNOSIS — I5042 Chronic combined systolic (congestive) and diastolic (congestive) heart failure: Secondary | ICD-10-CM

## 2021-07-12 DIAGNOSIS — I43 Cardiomyopathy in diseases classified elsewhere: Secondary | ICD-10-CM | POA: Diagnosis not present

## 2021-07-12 DIAGNOSIS — I4819 Other persistent atrial fibrillation: Secondary | ICD-10-CM

## 2021-07-12 MED ORDER — METOPROLOL SUCCINATE ER 25 MG PO TB24: 25.0000 mg | ORAL_TABLET | Freq: Every day | ORAL | 3 refills | Status: DC

## 2021-07-12 NOTE — Telephone Encounter (Signed)
Pt called and stated that they have been placed on prednisone for a good while 5 mg taper. Routing to pharmd pool since she is also on warfarin. Pt would like a call back

## 2021-07-12 NOTE — Patient Instructions (Signed)
Medication Instructions:  START: METOPROLOL SUCCINATE 25mg  ONCE DAILY  *If you need a refill on your cardiac medications before your next appointment, please call your pharmacy*  Follow-Up: At [facility], you and your health needs are our priority.  As part of our continuing mission to provide you with exceptional heart care, we have created designated Provider Care Teams.  These Care Teams include your primary Cardiologist (physician) and Advanced Practice Providers (APPs -  Physician Assistants and Nurse Practitioners) who all work together to provide you with the care you need, when you need it.  Your next appointment:   AS SCHEDULED   The format for your next appointment:   In Person  Provider:   [name], MD  Other Instructions PLEASE LET us KNOW IF ANYTHING CHANGES. NUMBER TO THE OFFICE IS [phone])

## 2021-07-13 ENCOUNTER — Telehealth: Payer: Self-pay | Admitting: Pharmacist

## 2021-07-13 NOTE — Telephone Encounter (Signed)
Returned call to pt and was able to change appt to 07/19/21 at 3pm

## 2021-07-13 NOTE — Telephone Encounter (Signed)
Recommend she come in 1 week after starting prednisone for INR check since she's starting on a lower dose (10mg  daily x 2 weeks, then 5mg  daily)

## 2021-07-13 NOTE — Chronic Care Management (AMB) (Signed)
    Chronic Care Management Pharmacy Assistant   Name: Regina [name]  MRN: [id number] DOB: Nov 05, 1940  07/14/21 APPOINTMENT REMINDER  [name] was reminded to have all medications, supplements and any blood glucose and blood pressure readings available for review with [name], Pharm. D, at her telephone visit on 07/14/21 at 2pm.   Questions: Have you had any recent office visit or specialist visit outside of [place]? No.   Are there any concerns you would like to discuss during your office visit? No concerns at this time.  Are you having any problems obtaining your medications? No issues at this time.  If patient has any PAP medications ask if they are having any problems getting their PAP medication or refill? Doe snot receive patient assistance.  Care Gaps:  AWV - scheduled for 02/16/22 Zoster vaccines - never done Urine microalbumin - overdue since 05/21/20 Foot exam - overdue since 09/08/20 Covid-19 vaccine booster 4 - overdue since 11/04/20 Flu vaccine - due  Star Rating Drug:  Atorvastatin 20mg  - last filled on 07/08/21 17DS at [facility] 10mg  - last filled on 06/30/21 29DS at [facility]  Any gaps in medications fill history? No.  [place]  Clinical Pharmacist Assistant [phone]

## 2021-07-14 ENCOUNTER — Ambulatory Visit (INDEPENDENT_AMBULATORY_CARE_PROVIDER_SITE_OTHER): Payer: Medicare Other | Admitting: Pharmacist

## 2021-07-14 DIAGNOSIS — I10 Essential (primary) hypertension: Secondary | ICD-10-CM

## 2021-07-14 DIAGNOSIS — E039 Hypothyroidism, unspecified: Secondary | ICD-10-CM

## 2021-07-14 NOTE — Progress Notes (Signed)
Chronic Care Management Pharmacy Note  07/19/2021 Name:  Regina [name] MRN:  [id number] DOB:  1940/10/21  Summary: Blood sugars have been varying lately per CGM report Patient is having difficulty affording her medications  Recommendations/Changes made from today's visit: -Sent message to neurologist about duloxetine prescription -Provided copay card to the pharmacy for Farxiga to help with cost of medication  Plan: Follow up for BP and DM assessment in 1 month  Subjective: Regina [name] is an 80 y.o. year old female who is a primary patient of [name], Malka So, MD.  The CCM team was consulted for assistance with disease management and care coordination needs.    Engaged with patient by telephone for follow up visit in response to provider referral for pharmacy case management and/or care coordination services.   Consent to Services:  The patient was given information about Chronic Care Management services, agreed to services, and gave verbal consent prior to initiation of services.  Please see initial visit note for detailed documentation.   Patient Care Team: [name], [name], MD as PCP - General (Family Medicine) O'Neal, Regina Freer, MD as PCP - Cardiology (Internal Medicine) Viona [name], [facility] as Pharmacist (Pharmacist)  Recent office visits: 06/09/21 [name] MD: Patient presented for hypotension and constipation. Recommended checking BP regularly.  02/15/21 [name] MD (PCP) - seen for cramps of lower extremity and other issues. Patient started on tizanidine 2-4 mg orally at bedtime as needed. Follow up as needed.  02/04/21 Charlott Rakes, LPN: Patient presented for AWV.  Recent consult visits: 07/12/21 Regina [name] O'Neal MD (Cardiology) -  patient presented to clinic for cardiac amyloidosis and other chronic issues. Refilled metoprolol.   07/09/21 Frederik Schmidt, RN (cardiology): Patient presented for anti-coag visit. INR 2.5, goal 2-3. Continued  warfarin 5 mg (5 mg x 1) every Sun; 2.5 mg (5 mg x 0.5) all other days.  06/18/21 Regina Pacas, MD (neurology): Patient presented for initial visit for neck pain and headache. A1c increased to 7.2%.  05/19/21 Regina Rile MD (Cardiology) -  patient presented to clinic for cardiac amyloidosis and other chronic issues.   05/14/21 Regina Stagers MD (Endocrinology) - patient presented to clinic for type 2 diabetes follow up. No medication changes. A1c improved to 6.8%. Recommended remaining on Toujeo 26 units daily. Started Trulicity 8.59 mg weekly. Follow up in 4 months.  04/15/21 Regina Rile MD (Cardiology) -  patient presented to clinic for cardiac amyloidosis and other chronic issues. Decreased metoprolol to 25 mg daily.  02/22/21 Regina [name], RD (endo): Patient presented for diet & nutrition education for diabetes.  02/05/21 Frederik Schmidt, RN (cardiology): Patient presented for anti-coag visit. INR 2.4, goal 2-3. Continued warfarin 5 mg (5 mg x 1) every Sun; 2.5 mg (5 mg x 0.5) all other days.  01/26/21 Regina Sarajane Jews PA-C (Cardiology) -  patient presented to clinic for hereditary cardiac amyloidosis and other chronic conditions. Discontinued iron vitamins, loratadine and polyethylene glycol. Follow up in 3 months.   Hospital visits: Patient visited [facility] ER on 04/02/21 due to Hypervolemia due to congestive heart failure and was present for 7 hours.   New? Medications Started at [facility] Discharge:?? -started None.   Medication Changes at Hospital Discharge: -Changed None.    Medications Discontinued at Hospital Discharge: -Stopped None.    Medications that remain the same after Hospital Discharge:?? -All other medications will remain the same.       Medication Reconciliation was completed  by comparing discharge summary, patient's EMR and Pharmacy list, and upon discussion with patient.   Patient visited [facility] ER on 12/01/20  due to Weakness and CVA. Patient was discharged on 12/02/20. Home health care arrangements made.    New? Medications Started at [facility] Discharge:?? -started None.   Medication Changes at Hospital Discharge: -Changed None.    Medications Discontinued at Hospital Discharge: -Stopped None.    Medications that remain the same after Hospital Discharge:?? -All other medications will remain the same.    Objective:  Lab Results  Component Value Date   CREATININE 2.30 (H) 05/10/2021   BUN 51 (H) 05/10/2021   GFR 20.67 (L) 02/15/2021   GFRNONAA 25 (L) 04/02/2021   GFRAA 30 (L) 08/03/2020   NA 145 (H) 05/10/2021   K 4.2 05/10/2021   CALCIUM 10.4 (H) 05/10/2021   CO2 29 05/10/2021   GLUCOSE 80 05/10/2021    Lab Results  Component Value Date/Time   HGBA1C 7.2 (H) 06/18/2021 11:10 AM   HGBA1C 6.8 (A) 05/14/2021 02:36 PM   HGBA1C 7.4 (A) 01/08/2021 02:20 PM   HGBA1C 7.6 (H) 03/24/2020 02:28 PM   FRUCTOSAMINE 325 (H) 09/09/2019 08:57 AM   FRUCTOSAMINE 349 (H) 05/22/2019 04:31 PM   GFR 20.67 (L) 02/15/2021 11:32 AM   GFR 39.05 (L) 09/09/2019 08:57 AM   MICROALBUR 1.9 05/22/2019 04:31 PM    Last diabetic Eye exam:  Lab Results  Component Value Date/Time   HMDIABEYEEXA Retinopathy (A) 06/18/2019 12:00 AM    Last diabetic Foot exam: No results found for: HMDIABFOOTEX   Lab Results  Component Value Date   CHOL 117 03/24/2020   HDL 56 03/24/2020   LDLCALC 49 03/24/2020   TRIG 54 03/24/2020   CHOLHDL 2 05/22/2019    Hepatic Function Latest Ref Rng & Units 04/25/2020 04/21/2020 03/24/2020  Total Protein 6.5 - 8.1 [name]/dL 7.7 7.8 8.4  Albumin 3.5 - 5.0 [name]/dL 3.6 - 4.8(H)  AST 15 - 41 U/L 24 - 19  ALT 0 - 44 U/L 18 - 11  Alk Phosphatase 38 - 126 U/L 51 - 85  Total Bilirubin 0.3 - 1.2 mg/dL 0.7 - 0.3  Bilirubin, Direct 0.0 - 0.2 mg/dL <0.1 - -    Lab Results  Component Value Date/Time   TSH 0.65 06/09/2021 11:06 AM   TSH 3.24 06/08/2020 08:35 AM    CBC Latest Ref Rng & Units  04/02/2021 08/03/2020 06/29/2020  WBC 4.0 - 10.5 K/uL 6.6 7.1 12.4(H)  Hemoglobin 12.0 - 15.0 [name]/dL 12.0 12.1 13.6  Hematocrit 36.0 - 46.0 % 37.7 36.8 41.3  Platelets 150 - 400 K/uL 249 362 353    Lab Results  Component Value Date/Time   VD25OH 55.3 03/24/2020 02:28 PM   VD25OH 59.65 05/22/2019 04:31 PM    Clinical ASCVD: Yes  The ASCVD Risk score (Arnett DK, et al., 2019) failed to calculate for the following reasons:   The patient has a prior MI or stroke diagnosis    Depression screen [facility] 2/9 02/22/2021 02/10/2021 03/24/2020  Decreased Interest 0 0 1  Down, Depressed, Hopeless 0 0 0  PHQ - 2 Score 0 0 1  Altered sleeping - - 3  Tired, decreased energy - - 3  Change in appetite - - 0  Feeling bad or failure about yourself  - - 0  Trouble concentrating - - 0  Moving slowly or fidgety/restless - - 0  Suicidal thoughts - - 0  PHQ-9 Score - - 7  Difficult doing work/chores - - Somewhat difficult    CHA2DS2/VAS Stroke Risk Points  Current as of 11 minutes ago     9 >= 2 Points: High Risk  1 - 1.99 Points: Medium Risk  0 Points: Low Risk    Last Change:       Details    This score determines the patient's risk of having a stroke if the  patient has atrial fibrillation.       Points Metrics  1 Has Congestive Heart Failure:  Yes    Current as of 11 minutes ago  1 Has Vascular Disease:  Yes    Current as of 11 minutes ago  1 Has Hypertension:  Yes    Current as of 11 minutes ago  2 Age:  48    Current as of 11 minutes ago  1 Has Diabetes:  Yes    Current as of 11 minutes ago  2 Had Stroke:  Yes  Had TIA:  No  Had Thromboembolism:  No    Current as of 11 minutes ago  1 Female:  Yes    Current as of 11 minutes ago      Social History   Tobacco Use  Smoking Status Never  Smokeless Tobacco Never   BP Readings from Last 3 Encounters:  07/12/21 118/64  06/18/21 (!) 112/58  06/09/21 126/60   Pulse Readings from Last 3 Encounters:  07/12/21 83  06/18/21 (!) 101   06/09/21 (!) 107   Wt Readings from Last 3 Encounters:  07/12/21 221 lb 6.4 oz (100.4 kg)  06/18/21 219 lb (99.3 kg)  06/09/21 220 lb 8 oz (100 kg)   BMI Readings from Last 3 Encounters:  07/12/21 39.98 kg/m  06/18/21 40.06 kg/m  06/09/21 40.33 kg/m    Assessment/Interventions: Review of patient past medical history, allergies, medications, health status, including review of consultants reports, laboratory and other test data, was performed as part of comprehensive evaluation and provision of chronic care management services.   SDOH:  (Social Determinants of Health) assessments and interventions performed: Yes   SDOH Screenings   Alcohol Screen: Not on file  Depression (PHQ2-9): Low Risk    PHQ-2 Score: 0  Financial Resource Strain: Low Risk    Difficulty of Paying Living Expenses: Not hard at all  Food Insecurity: No Food Insecurity   Worried About Charity fundraiser in the Last Year: Never true   Ran Out of Food in the Last Year: Never true  Housing: Low Risk    Last Housing Risk Score: 0  Physical Activity: Inactive   Days of Exercise per Week: 0 days   Minutes of Exercise per Session: 0 min  Social Connections: Moderately Isolated   Frequency of Communication with Friends and Family: More than three times a week   Frequency of Social Gatherings with Friends and Family: Twice a week   Attends Religious Services: More than 4 times per year   Active Member of [employer] or Organizations: No   Attends Archivist Meetings: Never   Marital Status: Widowed  Stress: No Stress Concern Present   Feeling of Stress : Not at all  Tobacco Use: Low Risk    Smoking Tobacco Use: Never   Smokeless Tobacco Use: Never  Transportation Needs: No Transportation Needs   Lack of Transportation (Medical): No   Lack of Transportation (Non-Medical): No   CCM Care Plan  Allergies  Allergen Reactions   Ace Inhibitors Other (See Comments)  unknown   Amlodipine Other (See  Comments)    unknown   Atenolol Other (See Comments)    bradycardia   Avandia [Rosiglitazone] Other (See Comments)    unknown   Darvon [Propoxyphene] Other (See Comments)    unknown   Erythromycin Itching   Hydralazine Other (See Comments)    Burning in throat and chest   Hydrocodone Other (See Comments)    Hallucinations.   Levofloxacin Itching   Morphine And Related Other (See Comments)    Dizzy and hallucianation, vomiting; Willing to try low dose   Percocet [Oxycodone-Acetaminophen] Other (See Comments)    hallucination   Spironolactone Other (See Comments)    unknown   Tramadol Other (See Comments)    Unknown/does not recall reaction but does not want to take again    Medications Reviewed Today     Reviewed by Regina Rile, MD (Physician) on 07/12/21 at 1410  Med List Status: <None>   Medication Order Taking? Sig Documenting Provider Last Dose Status Informant  Alcohol Swabs (B-D SINGLE USE SWABS REGULAR) PADS [id number] Yes Use to test blood sugar up to 3 times daily Shamleffer, Melanie Crazier, MD Taking Active   allopurinol (ZYLOPRIM) 100 MG tablet [id number] Yes Take 1 tablet (100 mg total) by mouth daily. [name], [name], MD Taking Active   Ascorbic Acid (VITAMIN C) 1000 MG tablet [id number] Yes Take 1,000 mg by mouth daily. [provider] Taking Active Self  atorvastatin (LIPITOR) 20 MG tablet [id number] Yes Take 1 tablet (20 mg total) by mouth daily. [name], [name], MD Taking Active   Blood Glucose Monitoring Suppl (ACCU-CHEK GUIDE) w/Device KIT [id number] Yes 1 Device by Does not apply route 3 (three) times daily. [name], [name], MD Taking Active Self  Capsaicin 0.033 % CREA [id number] Yes Apply 1 application topically 3 (three) times daily. Shamleffer, Melanie Crazier, MD Taking Active   cetirizine (ZYRTEC) 10 MG tablet [id number] Yes Take 10 mg by mouth daily. [provider] Taking Active   Cholecalciferol (VITAMIN D3) 50 MCG (2000 UT)  capsule [id number] Yes Take 2,000 Units by mouth daily. [provider] Taking Active Self  Continuous Blood Gluc Sensor (DEXCOM G6 SENSOR) MISC [id number] Yes 1 Device by Does not apply route as directed. Shamleffer, Melanie Crazier, MD Taking Active   Continuous Blood Gluc Transmit (DEXCOM G6 TRANSMITTER) MISC [id number] Yes 1 Device by Does not apply route as directed. Shamleffer, Melanie Crazier, MD Taking Active   diclofenac Sodium (VOLTAREN) 1 % GEL [id number] Yes Apply 1 application topically daily as needed (arthritis pain/hands). [provider] Taking Active Self  famotidine (PEPCID) 20 MG tablet [id number] Yes Take 1 tablet (20 mg total) by mouth at bedtime. [name], [name], MD Taking Active   FARXIGA 10 MG TABS tablet [id number] Yes Take 1 tablet (10 mg total) by mouth daily. Regina Rile, MD Taking Active   fluticasone [facility]) 50 MCG/ACT nasal spray [id number] Yes Place 1 spray into both nostrils daily. [provider] Taking Active Self  gabapentin (NEURONTIN) 600 MG tablet [id number] Yes TAKE ONE TABLET BY MOUTH EVERY MORNING and TAKE ONE TABLET BY MOUTH EVERYDAY AT BEDTIME [name], [name], MD Taking Active   glucose blood (ACCU-CHEK GUIDE) test strip [id number] Yes Use to test blood sugar up to 3 times daily Shamleffer, Melanie Crazier, MD Taking Active   insulin glargine, 1 Unit Dial, (TOUJEO SOLOSTAR) 300 UNIT/ML Solostar Pen [id number] Yes Inject 26 Units into the skin daily. Eat a snack with protein  nightly before bedtime. Shamleffer, Melanie Crazier, MD Taking Active   insulin lispro (HUMALOG KWIKPEN) 100 UNIT/ML KwikPen [id number] Yes Max daily 15 units Shamleffer, Melanie Crazier, MD Taking Active   Insulin Pen Needle 32G X 4 MM MISC [id number] Yes 1 Device by Does not apply route in the morning, at noon, in the evening, and at bedtime. Shamleffer, Melanie Crazier, MD Taking Active   Lancets Misc. (ACCU-CHEK FASTCLIX LANCET) KIT [id number] Yes Use to test  blood sugar up to 3 times daily Shamleffer, Melanie Crazier, MD Taking Active   levothyroxine (SYNTHROID) 150 MCG tablet [id number] Yes Take 1 tablet (150 mcg total) by mouth daily before breakfast. [name], [name], MD Taking Active   magnesium oxide (MAG-OX) 400 (240 Mg) MG tablet [id number] Yes Take 1 tablet (400 mg total) by mouth daily. [name], [name], MD Taking Active   meclizine (ANTIVERT) 25 MG tablet [id number] Yes Take 25 mg by mouth daily as needed for dizziness.  [provider] Taking Active Self           Med Note Avon Gully, [facility]   Fri Jan 08, 2021  2:12 PM) prn  nitroGLYCERIN (NITROSTAT) 0.4 MG SL tablet [id number] Yes Place 1 tablet (0.4 mg total) under the tongue every 5 (five) minutes as needed for chest pain. Barrett, Evelene Croon, PA-C Taking Active   Polyethylene Glycol 3350 (MIRALAX PO) [id number] Yes Take by mouth daily as needed. [provider] Taking Active   predniSONE (DELTASONE) 5 MG tablet [id number] Yes 2 tabs qam x 2weeks, then 1 tab qam Regina Pacas, MD Taking Active   Propylene Glycol (SYSTANE BALANCE OP) [id number] Yes Place 1 drop into both eyes daily as needed (for dry eyes). [provider] Taking Active Self  Tafamidis 61 MG CAPS [id number] Yes Take 61 mg by mouth daily. Regina Rile, MD Taking Active Self  tiZANidine (ZANAFLEX) 4 MG tablet [id number] Yes Take 0.5-1 tablets (2-4 mg total) by mouth at bedtime as needed for muscle spasms. [name], [name], MD Taking Active   torsemide (DEMADEX) 20 MG tablet [id number]  Take 2 tablets (40 mg total) by mouth daily. Ok to take extra dose if wt gain of 1 lb in 1 day or 5 lbs in 7 days O'Neal, Regina Freer, MD  Expired 05/23/21 [id number]   trolamine salicylate (ASPERCREME) 10 % cream [id number] Yes Apply 1 application topically as needed for muscle pain. [provider] Taking Active   warfarin (COUMADIN) 5 MG tablet [id number] Yes TAKE 1 TO 3 TABLETS DAILY OR AS DIRECTED BY THE COUMADIN  CLINIC O'Neal, Regina Freer, MD Taking Active             Patient Active Problem List   Diagnosis Date Noted   Polymyalgia rheumatica ([place]) 06/30/2021   Neck pain 06/18/2021   Gait abnormality 06/18/2021   Memory loss 06/18/2021   Atherosclerosis of aorta ([place]) 06/09/2021   Type 2 diabetes mellitus with stage 3a chronic kidney disease, with long-term current use of insulin ([place]) 09/14/2020   Type 2 diabetes mellitus with retinopathy, with long-term current use of insulin ([place]) 09/14/2020   Diabetes mellitus ([place]) 09/14/2020   Type 2 diabetes mellitus with diabetic polyneuropathy, with long-term current use of insulin ([place]) 09/14/2020   Long term (current) use of anticoagulants 07/13/2020   Persistent atrial fibrillation (HCC)    Mixed hyperlipidemia    Chronic diastolic heart failure ([place])    Demand ischemia ([place])    Atrial fibrillation ([place]) 04/24/2020   Other  fatigue 03/24/2020   Short of breath on exertion 03/24/2020   Congestive heart failure ([place]) 03/24/2020   Vitamin D deficiency 03/24/2020   Sleep apnea 05/21/2019   History of hepatitis C 05/21/2019   Insomnia 05/21/2019   CKD (chronic kidney disease), stage III ([place]) 05/21/2019   GERD (gastroesophageal reflux disease) 04/20/2018   Type 2 diabetes mellitus with diabetic neuropathy, unspecified ([place]) 12/19/2016   History of TIA (transient ischemic attack) 12/19/2016   Hyperlipidemia associated with type 2 diabetes mellitus ([place]) 12/19/2016   S/P total knee replacement using cement, right 03/18/2015   Non-obstructive CAD    Cardiovascular stress test abnormal    Pleuritic chest pain 01/16/2015   Acute renal failure superimposed on stage 3 chronic kidney disease ([place]) 01/16/2015   Obesity, Class III, BMI 40-49.9 (morbid obesity) ([place]) 01/16/2015   Essential hypertension 01/16/2015   Hypothyroidism 01/16/2015   OSA on CPAP 01/16/2015   DM type 2 (diabetes mellitus, type 2) ([place]) 01/16/2015   Coronary artery disease  involving native coronary artery without angina pectoris 10/02/2014   Physical deconditioning 02/28/2013   Class 3 severe obesity with serious comorbidity and body mass index (BMI) of 40.0 to 44.9 in adult ([place]) 02/27/2013   Osteoarthritis 02/27/2013    Immunization History  Administered Date(s) Administered   Fluad Quad(high Dose 65+) 06/21/2019   Influenza,inj,Quad PF,6+ Mos 01/17/2015   Influenza-Unspecified 01/17/2015, 10/25/2016   PFIZER(Purple Top)SARS-COV-2 Vaccination 11/06/2019, 11/27/2019, 08/12/2020   Pneumococcal Polysaccharide-23 05/01/2020    Conditions to be addressed/monitored:  Hypertension, Hyperlipidemia, Diabetes, Atrial Fibrillation, Heart Failure, Coronary Artery Disease, GERD, Chronic Kidney Disease, Hypothyroidism, Osteoarthritis, and vitamin D deficiency  Conditions addressed this visit: Diabetes, hypothyroidism  Care Plan : CCM Pharmacy Care Plan  Updates made by Viona [name], [place] since 07/19/2021 12:00 AM     Problem: Problem: Hypertension, Hyperlipidemia, Diabetes, Atrial Fibrillation, Heart Failure, Coronary Artery Disease, GERD, Chronic Kidney Disease, Hypothyroidism, Osteoarthritis, and vitamin D deficiency      Long-Range Goal: Patient-Specific Goal   Start Date: 04/13/2021  Expected End Date: 04/13/2022  Recent Progress: On track  Priority: High  Note:   Current Barriers:  Unable to independently monitor therapeutic efficacy Unable to achieve control of diabetes   Pharmacist Clinical Goal(s):  Patient will achieve adherence to monitoring guidelines and medication adherence to achieve therapeutic efficacy achieve control of diabetes as evidenced by A1c  through collaboration with PharmD and provider.   Interventions: 1:1 collaboration with [name], [name], MD regarding development and update of comprehensive plan of care as evidenced by provider attestation and co-signature Inter-disciplinary care team collaboration (see longitudinal plan  of care) Comprehensive medication review performed; medication list updated in electronic medical record  Hypertension (BP goal <130/80) -Controlled -Current treatment: Metoprolol tartrate 100 mg 1/2 tablet twice daily -Medications previously tried: none  -Current home readings: none to report -Current dietary habits: limits salt intake -Current exercise habits: trying to walk some -Denies hypotensive/hypertensive symptoms -Educated on Exercise goal of 150 minutes per week; Importance of home blood pressure monitoring; Proper BP monitoring technique; -Counseled to monitor BP at home at least weekly, document, and provide log at future appointments -Counseled on diet and exercise extensively Recommended bringing BP cuff to next appointment to compare readings and make sure her machine is not off. Patient reports she has been feeling out of sorts.  Hyperlipidemia: (LDL goal < 70) -Controlled -Current treatment: Atorvastatin 20 mg 1 tablet daily Fish oil 1000 mg 1 capsule twice daily -Medications previously tried: none  -  Current dietary patterns: does not fry foods -Current exercise habits: trying to walk some -Educated on Cholesterol goals;  Importance of limiting foods high in cholesterol; Exercise goal of 150 minutes per week; -Counseled on diet and exercise extensively Recommended to continue current medication Recommended to stop fish oil as she likely does not need based on triglycerides.  CAD (Goal: prevent heart events) -Controlled -Current treatment  Warfarin 5 mg as directed by coumadin clinic Nitroglycerin 0.4 mg 1 tablet as needed  -Medications previously tried: none  -Recommended checking nitroglycerin expiration date and making sure her on supply has not expired   Diabetes (A1c goal <7%) -Uncontrolled -Current medications: Farxiga 10 mg 1 tablet daily Toujeo U-300 inject 26 units nightly Humalog U-100 max 15 units daily (sliding scale) -Medications  previously tried: n/a  -Current home glucose readings fasting glucose: 79, 109, 90, 105, 105, 79, 79, 105, 93 post prandial glucose: 99, sometimes up to 200, 220 -Denies hypoglycemic/hyperglycemic symptoms -Current meal patterns:  breakfast: peaches, 2 eggs lunch: same as dinner dinner: sometimes potatoes with protein snacks: popcorn, graham crackers drinks: n/a -Current exercise: trying to walk some -Educated on A1c and blood sugar goals; Continuous glucose monitoring; Carbohydrate counting and/or plate method -Counseled to check feet daily and get yearly eye exams -Counseled on diet and exercise extensively Recommended to continue current medication Recommended to bring Dexcom to next endocrinology visit. Patient will likely require adjustments for Humalog sliding scale.    Atrial Fibrillation (Goal: prevent stroke and major bleeding) -Controlled -CHADSVASC: 9 -Current treatment: Rate control: Rate control: metoprolol tartrate 100 mg 1/2 tablet twice daily Anticoagulation: warfarin 5 mg as directed by coumadin clinic -Medications previously tried: n/a -Home BP and HR readings: refer to above  -Counseled on increased risk of stroke due to Afib and benefits of anticoagulation for stroke prevention; avoidance of NSAIDs due to increased bleeding risk with anticoagulants; -Counseled on diet and exercise extensively Recommended to continue current medication Educated on keeping green intake consistent with warfarin use.  Heart Failure (Goal: manage symptoms and prevent exacerbations) -Controlled -Last ejection fraction: 10-15% (Date: 08/05/20) -HF type: Systolic -NYHA Class: III (marked limitation of activity) -AHA HF Stage: C (Heart disease and symptoms present) -Current treatment: metoprolol tartrate 100 mg 1/2 tablet twice daily Torsemide 20 mg 1 tablet daily -Medications previously tried: n/a  -Current home BP/HR readings: refer to above -Current dietary habits: uses Mrs.  Dash and doesn't use salt -Current exercise habits: trying to walk some -Educated on Importance of weighing daily; if you gain more than 3 pounds in one day or 5 pounds in one week, call cardiologist Proper diuretic administration and potassium supplementation Importance of blood pressure control -Counseled on dosing of torsemide and recommended verifying dose with cardiology. Patient's weight has been fluctuating and she is taking 2 daily for the last few days which may explain her lower BP.  Hypothyroidism (Goal: TSH 0.35-4.5) -Controlled -Current treatment  Levothyroxine 150 mcg 1 tablet daily -Medications previously tried: none  -Counseled on importance of timing with this medication and taking on an empty stomach. Patient will set an alarm to remember to take daily.  Restless legs syndrome/neuropathy (Goal: minimize symptoms) -Controlled -Current treatment  Gabapentin 600 mg 1 tablet twice daily -Medications previously tried: none  -Recommended to continue current medication  Gout (Goal: prevent flare ups) -Not ideally controlled -Current treatment  Allopurinol 100 mg 1 tablet daily -Medications previously tried: none  -Recommended uric acid level. Patient's hands flare up all the time and she  is unsure if this is gout.  Cardiac amyloidosis  (Goal: minimize symptoms) -Controlled -Current treatment  Tafamidis 61 mg 1 capsule daily -Medications previously tried: none  -Recommended to continue current medication Assessed patient finances. Patient reports this is expensive but likely wouldn't qualify for assistance.  GERD (Goal: minimize symptoms) -Controlled -Current treatment  Famotidine 20 mg 1 tablet at bedtime as needed -Medications previously tried: pantoprazole  -Recommended to continue current medication Counseled on differences between pantoprazole and famotidine.  Allergic rhinitis (Goal: minimize symptoms) -Controlled -Current treatment  Cetirizine 10 mg 1  tablet daily Fluticasone 50 mcg/act spray in both nostrils as needed -Medications previously tried: none  -Recommended to continue current medication   Health Maintenance -Vaccine gaps: shingles, tetanus, COVID booster -Current therapy:  Meclizine 25 mg 1 tablet as needed Magnesium oxide 400 mg 1 tablet once daily Systane balance 1 drop in both eyes as needed Vitamin C 1000 mg 1 tablet daily  Lotrisone cream as needed Diclofenac gel 1% as needed Vitamin D 50 mcg daily (2000 units) Aspercreme as needed -Educated on Cost vs benefit of each product must be carefully weighed by individual consumer -Patient is satisfied with current therapy and denies issues -Recommended to continue current medication Recommended stopping vitamin C as there is no indication.  Patient Goals/Self-Care Activities Patient will:  - take medications as prescribed focus on medication adherence by setting an alarm to remember levothyroxine. check blood pressure at least weekly, document, and provide at future appointments weigh daily, and contact provider if weight gain of > 3 lbs in one day or > 5 lbs in one week target a minimum of 150 minutes of moderate intensity exercise weekly  Follow Up Plan: Telephone follow up appointment with care management team member scheduled for: 3 months        Medication Assistance: None required.  Patient affirms current coverage meets needs.  Compliance/Adherence/Medication fill history: Care Gaps: Urine microalbumin, foot exam, shingrix, COVID booster, influenza, eye exam  Star-Rating Drugs: Atorvastatin 59m - [facility]- [facility]/21 29[facility]  Patient's preferred pharmacy is:  [place] [address] [place] [address]: [phone]: [phone] (New Address) - [place] [address] Previously: [address]hone: [phone]: [phone] [place] [address] [place] ([place] [address]hone: [phone]: [phone] [place] [place] [address] [place]one: [phone]: [phone] pill box? Yes Pt endorses 95% compliance  We discussed: Benefits of medication synchronization, packaging and delivery as well as enhanced pharmacist oversight with [facility]. Patient decided to: Utilize [facility] for medication synchronization, packaging and delivery  Care Plan and Follow Up Patient Decision:  Patient agrees to Care Plan and Follow-up.  Plan: Telephone follow up appointment with care management team member scheduled for:  4 months  MaJeni SallesPharmD, BCIaegerharmacist [place] Br[place] View [phone]

## 2021-07-15 ENCOUNTER — Telehealth: Payer: Self-pay | Admitting: Pharmacist

## 2021-07-15 ENCOUNTER — Telehealth: Payer: Self-pay | Admitting: Internal Medicine

## 2021-07-15 MED ORDER — PEN NEEDLES 31G X 8 MM MISC
2 refills | Status: DC
Start: 2021-07-15 — End: 2021-10-14

## 2021-07-15 NOTE — Chronic Care Management (AMB) (Signed)
Chronic Care Management Pharmacy Assistant   Name: Regina [name]  MRN: [id number] DOB: 1941/02/03   [facility] and spoke with Estill Bamberg t front desk. I left an urgent message to have patients pen needles sent over to [facility] due to her delivery going out today. Patient specifically requested the 39m x 31g x 5/16.  AEstill Bambergwill send message to doctors nurse. MJeni Sallesaware.   Medications: Outpatient Encounter Medications as of 07/15/2021  Medication Sig Note   Alcohol Swabs (B-D SINGLE USE SWABS REGULAR) PADS Use to test blood sugar up to 3 times daily    allopurinol (ZYLOPRIM) 100 MG tablet Take 1 tablet (100 mg total) by mouth daily.    atorvastatin (LIPITOR) 20 MG tablet Take 1 tablet (20 mg total) by mouth daily.    Blood Glucose Monitoring Suppl (ACCU-CHEK GUIDE) w/Device KIT 1 Device by Does not apply route 3 (three) times daily.    Capsaicin 0.033 % CREA Apply 1 application topically 3 (three) times daily.    cetirizine (ZYRTEC) 10 MG tablet Take 10 mg by mouth daily.    Cholecalciferol (VITAMIN D3) 50 MCG (2000 UT) capsule Take 2,000 Units by mouth daily.    Continuous Blood Gluc Sensor (DEXCOM G6 SENSOR) MISC 1 Device by Does not apply route as directed.    Continuous Blood Gluc Transmit (DEXCOM G6 TRANSMITTER) MISC 1 Device by Does not apply route as directed.    diclofenac Sodium (VOLTAREN) 1 % GEL Apply 1 application topically daily as needed (arthritis pain/hands).    famotidine (PEPCID) 20 MG tablet Take 1 tablet (20 mg total) by mouth at bedtime.    FARXIGA 10 MG TABS tablet Take 1 tablet (10 mg total) by mouth daily.    fluticasone (FLONASE) 50 MCG/ACT nasal spray Place 1 spray into both nostrils daily.    gabapentin (NEURONTIN) 600 MG tablet TAKE ONE TABLET BY MOUTH EVERY MORNING and TAKE ONE TABLET BY MOUTH EVERYDAY AT BEDTIME    glucose blood (ACCU-CHEK GUIDE) test strip Use to test blood sugar up to 3 times daily    insulin glargine,  1 Unit Dial, (TOUJEO SOLOSTAR) 300 UNIT/ML Solostar Pen Inject 26 Units into the skin daily. Eat a snack with protein nightly before bedtime.    insulin lispro (HUMALOG KWIKPEN) 100 UNIT/ML KwikPen Max daily 15 units    Insulin Pen Needle 32G X 4 MM MISC 1 Device by Does not apply route in the morning, at noon, in the evening, and at bedtime.    Lancets Misc. (ACCU-CHEK FASTCLIX LANCET) KIT Use to test blood sugar up to 3 times daily    levothyroxine (SYNTHROID) 150 MCG tablet Take 1 tablet (150 mcg total) by mouth daily before breakfast.    magnesium oxide (MAG-OX) 400 (240 Mg) MG tablet Take 1 tablet (400 mg total) by mouth daily.    meclizine (ANTIVERT) 25 MG tablet Take 25 mg by mouth daily as needed for dizziness.  01/08/2021: prn   metoprolol succinate (TOPROL-XL) 25 MG 24 hr tablet Take 1 tablet (25 mg total) by mouth daily. Take with or immediately following a meal.    nitroGLYCERIN (NITROSTAT) 0.4 MG SL tablet Place 1 tablet (0.4 mg total) under the tongue every 5 (five) minutes as needed for chest pain.    Polyethylene Glycol 3350 (MIRALAX PO) Take by mouth daily as needed.    predniSONE (DELTASONE) 5 MG tablet 2 tabs qam x 2weeks, then 1 tab qam    Propylene Glycol (  SYSTANE BALANCE OP) Place 1 drop into both eyes daily as needed (for dry eyes).    Tafamidis 61 MG CAPS Take 61 mg by mouth daily.    tiZANidine (ZANAFLEX) 4 MG tablet Take 0.5-1 tablets (2-4 mg total) by mouth at bedtime as needed for muscle spasms.    torsemide (DEMADEX) 20 MG tablet Take 2 tablets (40 mg total) by mouth daily. Ok to take extra dose if wt gain of 1 lb in 1 day or 5 lbs in 7 days    trolamine salicylate (ASPERCREME) 10 % cream Apply 1 application topically as needed for muscle pain.    warfarin (COUMADIN) 5 MG tablet TAKE 1 TO 3 TABLETS DAILY OR AS DIRECTED BY THE COUMADIN CLINIC    No facility-administered encounter medications on file as of 07/15/2021.    Care Gaps:  AWV - scheduled for 02/16/22 Zoster  vaccines - due Urine microalbumin - over due since 05/21/20 Foot exam - overdue since 09/08/20 Covid-19 vaccine booster 4 - overdue since 11/04/20 Flu vaccine - due  Star Rating Drugs:  Atorvastatin 40m - [facility]DS [facility]- [facility] on [facility]/21 29DS [phone]

## 2021-07-15 NOTE — Telephone Encounter (Signed)
[facility] calling to request refill of needle pens 29mmx31gx5/16 size needle pens.

## 2021-07-16 DIAGNOSIS — I10 Essential (primary) hypertension: Secondary | ICD-10-CM

## 2021-07-16 DIAGNOSIS — E039 Hypothyroidism, unspecified: Secondary | ICD-10-CM | POA: Diagnosis not present

## 2021-07-19 ENCOUNTER — Other Ambulatory Visit: Payer: Self-pay

## 2021-07-19 ENCOUNTER — Telehealth: Payer: Self-pay | Admitting: Pharmacist

## 2021-07-19 ENCOUNTER — Ambulatory Visit (INDEPENDENT_AMBULATORY_CARE_PROVIDER_SITE_OTHER): Payer: Medicare Other

## 2021-07-19 DIAGNOSIS — Z7901 Long term (current) use of anticoagulants: Secondary | ICD-10-CM | POA: Diagnosis not present

## 2021-07-19 DIAGNOSIS — I4819 Other persistent atrial fibrillation: Secondary | ICD-10-CM | POA: Diagnosis not present

## 2021-07-19 LAB — POCT INR: INR: 3.9 — AB (ref 2.0–3.0)

## 2021-07-19 NOTE — Patient Instructions (Signed)
Visit Information   Goals Addressed   None    Patient Care Plan: CCM Pharmacy Care Plan     Problem Identified: Problem: Hypertension, Hyperlipidemia, Diabetes, Atrial Fibrillation, Heart Failure, Coronary Artery Disease, GERD, Chronic Kidney Disease, Hypothyroidism, Osteoarthritis, and vitamin D deficiency      Long-Range Goal: Patient-Specific Goal   Start Date: 04/13/2021  Expected End Date: 04/13/2022  Recent Progress: On track  Priority: High  Note:   Current Barriers:  Unable to independently monitor therapeutic efficacy Unable to achieve control of diabetes   Pharmacist Clinical Goal(s):  Patient will achieve adherence to monitoring guidelines and medication adherence to achieve therapeutic efficacy achieve control of diabetes as evidenced by A1c  through collaboration with PharmD and provider.   Interventions: 1:1 collaboration with [name], [name], MD regarding development and update of comprehensive plan of care as evidenced by provider attestation and co-signature Inter-disciplinary care team collaboration (see longitudinal plan of care) Comprehensive medication review performed; medication list updated in electronic medical record  Hypertension (BP goal <130/80) -Controlled -Current treatment: Metoprolol tartrate 100 mg 1/2 tablet twice daily -Medications previously tried: none  -Current home readings: none to report -Current dietary habits: limits salt intake -Current exercise habits: trying to walk some -Denies hypotensive/hypertensive symptoms -Educated on Exercise goal of 150 minutes per week; Importance of home blood pressure monitoring; Proper BP monitoring technique; -Counseled to monitor BP at home at least weekly, document, and provide log at future appointments -Counseled on diet and exercise extensively Recommended bringing BP cuff to next appointment to compare readings and make sure her machine is not off. Patient reports she has been feeling out of  sorts.  Hyperlipidemia: (LDL goal < 70) -Controlled -Current treatment: Atorvastatin 20 mg 1 tablet daily Fish oil 1000 mg 1 capsule twice daily -Medications previously tried: none  -Current dietary patterns: does not fry foods -Current exercise habits: trying to walk some -Educated on Cholesterol goals;  Importance of limiting foods high in cholesterol; Exercise goal of 150 minutes per week; -Counseled on diet and exercise extensively Recommended to continue current medication Recommended to stop fish oil as she likely does not need based on triglycerides.  CAD (Goal: prevent heart events) -Controlled -Current treatment  Warfarin 5 mg as directed by coumadin clinic Nitroglycerin 0.4 mg 1 tablet as needed  -Medications previously tried: none  -Recommended checking nitroglycerin expiration date and making sure her on supply has not expired   Diabetes (A1c goal <7%) -Uncontrolled -Current medications: Farxiga 10 mg 1 tablet daily Toujeo U-300 inject 26 units nightly Humalog U-100 max 15 units daily (sliding scale) -Medications previously tried: n/a  -Current home glucose readings fasting glucose: 79, 109, 90, 105, 105, 79, 79, 105, 93 post prandial glucose: 99, sometimes up to 200, 220 -Denies hypoglycemic/hyperglycemic symptoms -Current meal patterns:  breakfast: peaches, 2 eggs lunch: same as dinner dinner: sometimes potatoes with protein snacks: popcorn, graham crackers drinks: n/a -Current exercise: trying to walk some -Educated on A1c and blood sugar goals; Continuous glucose monitoring; Carbohydrate counting and/or plate method -Counseled to check feet daily and get yearly eye exams -Counseled on diet and exercise extensively Recommended to continue current medication Recommended to bring Dexcom to next endocrinology visit. Patient will likely require adjustments for Humalog sliding scale.    Atrial Fibrillation (Goal: prevent stroke and major  bleeding) -Controlled -CHADSVASC: 9 -Current treatment: Rate control: Rate control: metoprolol tartrate 100 mg 1/2 tablet twice daily Anticoagulation: warfarin 5 mg as directed by coumadin clinic -Medications previously tried:  n/a -Home BP and HR readings: refer to above  -Counseled on increased risk of stroke due to Afib and benefits of anticoagulation for stroke prevention; avoidance of NSAIDs due to increased bleeding risk with anticoagulants; -Counseled on diet and exercise extensively Recommended to continue current medication Educated on keeping green intake consistent with warfarin use.  Heart Failure (Goal: manage symptoms and prevent exacerbations) -Controlled -Last ejection fraction: 10-15% (Date: 08/05/20) -HF type: Systolic -NYHA Class: III (marked limitation of activity) -AHA HF Stage: C (Heart disease and symptoms present) -Current treatment: metoprolol tartrate 100 mg 1/2 tablet twice daily Torsemide 20 mg 1 tablet daily -Medications previously tried: n/a  -Current home BP/HR readings: refer to above -Current dietary habits: uses Mrs. Dash and doesn't use salt -Current exercise habits: trying to walk some -Educated on Importance of weighing daily; if you gain more than 3 pounds in one day or 5 pounds in one week, call cardiologist Proper diuretic administration and potassium supplementation Importance of blood pressure control -Counseled on dosing of torsemide and recommended verifying dose with cardiology. Patient's weight has been fluctuating and she is taking 2 daily for the last few days which may explain her lower BP.  Hypothyroidism (Goal: TSH 0.35-4.5) -Controlled -Current treatment  Levothyroxine 150 mcg 1 tablet daily -Medications previously tried: none  -Counseled on importance of timing with this medication and taking on an empty stomach. Patient will set an alarm to remember to take daily.  Restless legs syndrome/neuropathy (Goal: minimize  symptoms) -Controlled -Current treatment  Gabapentin 600 mg 1 tablet twice daily -Medications previously tried: none  -Recommended to continue current medication  Gout (Goal: prevent flare ups) -Not ideally controlled -Current treatment  Allopurinol 100 mg 1 tablet daily -Medications previously tried: none  -Recommended uric acid level. Patient's hands flare up all the time and she is unsure if this is gout.  Cardiac amyloidosis  (Goal: minimize symptoms) -Controlled -Current treatment  Tafamidis 61 mg 1 capsule daily -Medications previously tried: none  -Recommended to continue current medication Assessed patient finances. Patient reports this is expensive but likely wouldn't qualify for assistance.  GERD (Goal: minimize symptoms) -Controlled -Current treatment  Famotidine 20 mg 1 tablet at bedtime as needed -Medications previously tried: pantoprazole  -Recommended to continue current medication Counseled on differences between pantoprazole and famotidine.  Allergic rhinitis (Goal: minimize symptoms) -Controlled -Current treatment  Cetirizine 10 mg 1 tablet daily Fluticasone 50 mcg/act spray in both nostrils as needed -Medications previously tried: none  -Recommended to continue current medication   Health Maintenance -Vaccine gaps: shingles, tetanus, COVID booster -Current therapy:  Meclizine 25 mg 1 tablet as needed Magnesium oxide 400 mg 1 tablet once daily Systane balance 1 drop in both eyes as needed Vitamin C 1000 mg 1 tablet daily  Lotrisone cream as needed Diclofenac gel 1% as needed Vitamin D 50 mcg daily (2000 units) Aspercreme as needed -Educated on Cost vs benefit of each product must be carefully weighed by individual consumer -Patient is satisfied with current therapy and denies issues -Recommended to continue current medication Recommended stopping vitamin C as there is no indication.  Patient Goals/Self-Care Activities Patient will:  - take  medications as prescribed focus on medication adherence by setting an alarm to remember levothyroxine. check blood pressure at least weekly, document, and provide at future appointments weigh daily, and contact provider if weight gain of > 3 lbs in one day or > 5 lbs in one week target a minimum of 150 minutes of moderate intensity exercise  weekly  Follow Up Plan: Telephone follow up appointment with care management team member scheduled for: 3 months       Patient verbalizes understanding of instructions provided today and agrees to view in [place].  The pharmacy team will reach out to the patient again over the next 30 days.   Viona Gilmore, [facility]

## 2021-07-19 NOTE — Patient Instructions (Signed)
Hold tonight and tomorrow night only and then Continue taking 1/2 tablet daily except 1 tablet each Sunday.  Repeat INR in 2 weeks; [phone]; Eat greens 4 days per week;

## 2021-07-19 NOTE — Telephone Encounter (Addendum)
Reviewed chart for medication changes ahead of medication coordination call.  BP Readings from Last 3 Encounters:  07/12/21 118/64  06/18/21 (!) 112/58  06/09/21 126/60    Lab Results  Component Value Date   HGBA1C 7.2 (H) 06/18/2021     Patient obtains medications through Adherence Packaging  30 Days   Last adherence delivery included:  This is the first adherence packaging delivery  Patient is due for next adherence delivery on: 07/27/21. Called patient and reviewed medications and coordinated delivery.  This delivery to include: Allopurinol 100 mg 1 tablet at breakfast Atorvastatin 20 mg 1 tablet at bedtime Cetirizine 10 mg 1 tablet at breakfast Levothyroxine 150 mcg 1 tablet before breakfast Farxiga 10 mg 1 tablet at breakfast Gabapentin 600 mg 1 tablet at breakfast and at bedtime Magnesium oxide 400 mg 1 tablet at breakfast Metoprolol succinate 25 mg 1 tablet at breakfast Warfarin 5 mg tablet (easy top vials) Torsemide 20 mg 2 tablets at breakfast and as needed (extra in an easy top vial)   Coordinated acute fill for delivery on 07/15/21 : Toujeo solostar U-300 injection  Humalog kwikpen U-100 injection Pen needles 8 mm x 31 g x 5/16 (patient prefers these for both Toujeo and Humalog) Gabapentin 600 mg tablet (18 tablets left)  Metoprolol succinate 25 mg tablets (4 tablets left)   Patient declined the following medications: Vitamin C (no longer taking) Vitamin D (gets cheaper through [facility])  Patient needs refills for pen needles.  Confirmed delivery date of 07/27/21, advised patient that pharmacy will contact them the morning of delivery.   Patient requested easy top vials.

## 2021-07-20 ENCOUNTER — Telehealth: Payer: Self-pay | Admitting: Pharmacist

## 2021-07-20 ENCOUNTER — Telehealth: Payer: Self-pay | Admitting: Neurology

## 2021-07-20 NOTE — Telephone Encounter (Signed)
Patient called and reported she will have to stay on prednisone until 10/26

## 2021-07-20 NOTE — Telephone Encounter (Signed)
Pt called wanting to know how long she is to be on the predniSONE (DELTASONE) 5 MG tablet Please advise.

## 2021-07-20 NOTE — Telephone Encounter (Signed)
I spoke to the patient. She verbalized understanding to continue prednisone 5 mg, one tab daily until her next appt (08/11/21).

## 2021-07-20 NOTE — Telephone Encounter (Signed)
In review of her chart, the should have tapered down to prednisone 5 mg, one tablet each morning (refills on file at pharmacy). She should continue this dose until her follow up with Dr. Krista Blue on 08/11/21. She will be further advised on the medication at that time.   Left message requesting patient to call back.

## 2021-08-02 ENCOUNTER — Ambulatory Visit: Payer: Medicare Other

## 2021-08-02 ENCOUNTER — Other Ambulatory Visit: Payer: Self-pay

## 2021-08-02 DIAGNOSIS — Z7901 Long term (current) use of anticoagulants: Secondary | ICD-10-CM

## 2021-08-02 DIAGNOSIS — I4819 Other persistent atrial fibrillation: Secondary | ICD-10-CM | POA: Diagnosis not present

## 2021-08-02 LAB — POCT INR: INR: 2.2 (ref 2.0–3.0)

## 2021-08-02 NOTE — Patient Instructions (Addendum)
Continue taking 1/2 tablet daily except 1 tablet each Sunday.  Repeat INR in 4 weeks; [phone]; Eat greens 4 days per week;  On 10/26, if Prednisone is continued by your doctor, call the office for further instructions about appointment.

## 2021-08-06 ENCOUNTER — Telehealth: Payer: Self-pay | Admitting: Cardiovascular Disease

## 2021-08-06 MED ORDER — NITROGLYCERIN 0.4 MG SL SUBL: 0.4000 mg | SUBLINGUAL_TABLET | SUBLINGUAL | 2 refills | Status: DC | PRN

## 2021-08-06 NOTE — Telephone Encounter (Signed)
*  STAT* If patient is at the pharmacy, call can be transferred to refill team.   1. Which medications need to be refilled? (please list name of each medication and dose if known)  nitroGLYCERIN (NITROSTAT) 0.4 MG SL tablet  2. Which pharmacy/location (including street and city if local pharmacy) is medication to be sent to? [facility] - , [place] - [address]  3. Do they need a 30 day or 90 day supply? 30 with refills  

## 2021-08-11 ENCOUNTER — Encounter: Payer: Self-pay | Admitting: Neurology

## 2021-08-11 ENCOUNTER — Ambulatory Visit: Payer: Medicare Other | Admitting: Neurology

## 2021-08-11 ENCOUNTER — Telehealth: Payer: Self-pay | Admitting: Pulmonary Disease

## 2021-08-11 ENCOUNTER — Telehealth: Payer: Self-pay

## 2021-08-11 VITALS — BP 95/63 | HR 90 | Ht 62.0 in | Wt 220.0 lb

## 2021-08-11 DIAGNOSIS — M542 Cervicalgia: Secondary | ICD-10-CM | POA: Diagnosis not present

## 2021-08-11 DIAGNOSIS — M353 Polymyalgia rheumatica: Secondary | ICD-10-CM | POA: Diagnosis not present

## 2021-08-11 DIAGNOSIS — R799 Abnormal finding of blood chemistry, unspecified: Secondary | ICD-10-CM | POA: Diagnosis not present

## 2021-08-11 DIAGNOSIS — R911 Solitary pulmonary nodule: Secondary | ICD-10-CM

## 2021-08-11 DIAGNOSIS — R7982 Elevated C-reactive protein (CRP): Secondary | ICD-10-CM | POA: Diagnosis not present

## 2021-08-11 DIAGNOSIS — R700 Elevated erythrocyte sedimentation rate: Secondary | ICD-10-CM | POA: Diagnosis not present

## 2021-08-11 DIAGNOSIS — R7309 Other abnormal glucose: Secondary | ICD-10-CM | POA: Diagnosis not present

## 2021-08-11 MED ORDER — DULOXETINE HCL 60 MG PO CPEP: 60.0000 mg | ORAL_CAPSULE | Freq: Every day | ORAL | 6 refills | Status: DC

## 2021-08-11 MED ORDER — PREDNISONE 1 MG PO TABS: 4.0000 mg | ORAL_TABLET | Freq: Every day | ORAL | 1 refills | Status: DC

## 2021-08-11 NOTE — Telephone Encounter (Signed)
Pt called in to state that they will be on prednisone for 2 months on a decrease taper from 4 mg 2wk, 3 mg 2wk, 2mg  2wk, 1mg  2wk.  I verbally spoke this to [place] rn and he wanted to bring her into the clinic sooner. I rescheduled the appt sooner

## 2021-08-11 NOTE — Patient Instructions (Signed)
Meds ordered this encounter  Medications   predniSONE (DELTASONE) 1 MG tablet    Sig: Take 4 tablets (4 mg total) by mouth daily with breakfast.    Dispense:  120 tablet    Refill:  1   DULoxetine (CYMBALTA) 60 MG capsule    Sig: Take 1 capsule (60 mg total) by mouth daily.    Dispense:  30 capsule    Refill:  6     Prednisone 4mg  daily x 2 weeks Then 3 mg daily x 2 week  2mg  daily x 2 wek  1mg  daily

## 2021-08-11 NOTE — Telephone Encounter (Signed)
Received message from [facility] needing new order for CT as the old one has expired. New order has been placed. Nothing further needed at this time.

## 2021-08-11 NOTE — Progress Notes (Signed)
Chief Complaint  Patient presents with   Follow-up    Rm 16, with granddaughter,states she is doing better, neck pain has improved       ASSESSMENT AND PLAN  ASTELLA DESIR is a 80 y.o. female  Neck pain, intermittent radiating pain,  Body achy pain, elevated ESR C-reactive protein  Most consistent with polymyalgia rheumatica, dramatic quick improvement with low-dose of prednisone 10 mg since June 29, 2021 for 2 weeks, followed by 5 mg daily,  Will continue tapering off prednisone dosage, 1 mg prescription was given, 1 mg decrement every 2 weeks,  Add on Cymbalta 60 mg daily,  Neck pain and most consistent with muscular strain, previous ACDF C3-4, continue neck stretching exercise,  Repeat ESR C-reactive protein today      DIAGNOSTIC DATA (LABS, IMAGING, TESTING) - I reviewed patient records, labs, notes, testing and imaging myself where available.  Laboratory evaluations in February 2022, normal CBC hemoglobin of 13.3, CMP, creatinine of 2.1, lipid panel, LDL 52, normal TSH, free T4,  MEDICAL HISTORY:  NAI BORROMEO, is a 80 year old female, seen in request by   her primary care physician Dr. [name], [name] for evaluation of neck pain, headache, initial evaluation was on June 18, 2021  I reviewed and summarized the referring note. 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 Darrol Angel on March 17, 2021, suggested MRI of the neck, but not performed  She did have MRI of the brain in [facility] in February 2022, reported small likely subacute infarction at the right precentral gyri,  small scattered microhemorrhage  CT angiogram of head and neck showed no large vessel disease, atherosclerotic calcification of the proximal cervical internal carotid artery, proximal LAD 20% stenosis on the left, no significant narrowing on the right  Laboratory evaluation showed abnormal creatinine 2.1, normal TSH  She was not offered any treatment, over the past few months, her headache neck pain actually improved, noticed mild worsening gait abnormality  Update August 11, 2021: She is accompanied by her granddaughter at today's clinical visit,  Laboratory evaluation in September 2022 showed normal or negative B12, RPR, TSH, but elevated A1c 6.8, elevated C-reactive protein 33, ESR 58,  She does complains of diffuse body achy pain, and neck pain, concerning the possibility of polymyalgia rheumatica, she was started on prednisone 5 mg 2 tablets for 2 weeks on June 29, 2021, shortly after prednisone treatment, her symptoms has quickly improved, has been on 5 mg daily afterwards, she does complains of higher glucose level with prednisone treatment, she no longer has diffuse body achy pain no significant neck pain, she has mild bilateral lower extremity numbness tingling, taking gabapentin 600 mg daily, We personally reviewed MRI cervical spine July 09, 2021: Evidence of postoperative change ACDF C3-4, with mild multilevel degenerative changes, no evidence of canal or foraminal narrowing.  PHYSICAL EXAM:   Vitals:   08/11/21 0726  BP: 95/63  Pulse: 90  Weight: 220 lb (99.8 kg)  Height: _0  (1.575 m)   Not recorded     Body mass index is 40.24 kg/m.  PHYSICAL EXAMNIATION:  Gen: NAD, conversant, well nourised, well groomed        NEUROLOGICAL EXAM:  MENTAL STATUS: Speech/cognition: Awake, alert, oriented to history taking and casual conversation   CRANIAL NERVES: CN II: Visual fields are full to confrontation. Pupils are round equal and briskly reactive to  light. CN III, IV, VI: extraocular movement are normal. No ptosis. CN V: Facial sensation is intact to light touch CN VII: Face is symmetric with normal eye closure  CN VIII: Hearing is normal to causal conversation. CN IX, X: Phonation is normal. CN XI: Head turning and shoulder shrug are intact  MOTOR: There is no pronator drift of out-stretched arms. Muscle bulk and tone are normal. Muscle strength is normal.  REFLEXES: Reflexes are 1 and symmetric at the biceps, triceps, knees, and absent at ankles. Plantar responses are flexor.  SENSORY: Slight length dependent vibratory sensation in toes,   COORDINATION: There is no trunk or limb dysmetria noted.  GAIT/STANCE: Able to get up from seated position arm crossed, slightly unsteady  REVIEW OF SYSTEMS:  Full 14 system review of systems performed and notable only for as above All other review of systems were negative.   ALLERGIES: Allergies  Allergen Reactions   Ace Inhibitors Other (See Comments)    unknown   Amlodipine Other (See Comments)    unknown   Atenolol Other (See Comments)    bradycardia   Avandia [Rosiglitazone] Other (See Comments)    unknown   Darvon [Propoxyphene] Other (See Comments)    unknown   Erythromycin Itching   Hydralazine Other (See Comments)    Burning in throat and chest   Hydrocodone Other (See Comments)    Hallucinations.   Levofloxacin Itching   Morphine And Related Other (See Comments)    Dizzy and hallucianation, vomiting; Willing to try low dose   Percocet [Oxycodone-Acetaminophen] Other (See Comments)    hallucination   Spironolactone Other (See Comments)    unknown   Tramadol Other (See Comments)    Unknown/does not recall reaction but does not want to take again    HOME MEDICATIONS: Current Outpatient Medications  Medication Sig Dispense Refill   Alcohol Swabs (B-D SINGLE USE SWABS REGULAR) PADS Use to test blood sugar up to 3 times daily 100 each 3   allopurinol  (ZYLOPRIM) 100 MG tablet Take 1 tablet (100 mg total) by mouth daily. 90 tablet 1   atorvastatin (LIPITOR) 20 MG tablet Take 1 tablet (20 mg total) by mouth daily. 90 tablet 1   Blood Glucose Monitoring Suppl (ACCU-CHEK GUIDE) w/Device KIT 1 Device by Does not apply route 3 (three) times daily. 1 kit 0   Capsaicin 0.033 % CREA Apply 1 application topically 3 (three) times daily. 56.6 g 11   cetirizine (ZYRTEC) 10 MG tablet Take 10 mg by mouth daily.     Cholecalciferol (VITAMIN D3) 50 MCG (2000 UT) capsule Take 2,000 Units by mouth daily.     Continuous Blood Gluc Sensor (DEXCOM G6 SENSOR) MISC 1 Device by Does not apply route as directed. 9 each 3   Continuous Blood Gluc Transmit (DEXCOM G6 TRANSMITTER) MISC 1 Device by Does not apply route as directed. 1 each 3   diclofenac Sodium (VOLTAREN) 1 % GEL Apply 1 application topically daily as needed (arthritis pain/hands).     famotidine (PEPCID) 20 MG tablet Take 1 tablet (20 mg total) by mouth at bedtime. 90 tablet 1   FARXIGA 10 MG TABS tablet Take 1 tablet (10 mg total) by mouth daily. 90 tablet 3   fluticasone (FLONASE) 50 MCG/ACT nasal spray Place 1 spray into  both nostrils daily.     gabapentin (NEURONTIN) 600 MG tablet TAKE ONE TABLET BY MOUTH EVERY MORNING and TAKE ONE TABLET BY MOUTH EVERYDAY AT BEDTIME 60 tablet 3   glucose blood (ACCU-CHEK GUIDE) test strip Use to test blood sugar up to 3 times daily 100 each 3   insulin glargine, 1 Unit Dial, (TOUJEO SOLOSTAR) 300 UNIT/ML Solostar Pen Inject 26 Units into the skin daily. Eat a snack with protein nightly before bedtime. 15 mL 6   insulin lispro (HUMALOG KWIKPEN) 100 UNIT/ML KwikPen Max daily 15 units 15 mL 11   Insulin Pen Needle (PEN NEEDLES) 31G X 8 MM MISC Check blood sugar 3-4 times a day as directed 100 each 2   Insulin Pen Needle 32G X 4 MM MISC 1 Device by Does not apply route in the morning, at noon, in the evening, and at bedtime. 150 each 6   Lancets Misc. (ACCU-CHEK FASTCLIX  LANCET) KIT Use to test blood sugar up to 3 times daily 1 kit 1   levothyroxine (SYNTHROID) 150 MCG tablet Take 1 tablet (150 mcg total) by mouth daily before breakfast. 90 tablet 1   magnesium oxide (MAG-OX) 400 (240 Mg) MG tablet Take 1 tablet (400 mg total) by mouth daily. 90 tablet 1   meclizine (ANTIVERT) 25 MG tablet Take 25 mg by mouth daily as needed for dizziness.      metoprolol succinate (TOPROL-XL) 25 MG 24 hr tablet Take 1 tablet (25 mg total) by mouth daily. Take with or immediately following a meal. 90 tablet 3   nitroGLYCERIN (NITROSTAT) 0.4 MG SL tablet Place 1 tablet (0.4 mg total) under the tongue every 5 (five) minutes as needed for chest pain. 25 tablet 2   Polyethylene Glycol 3350 (MIRALAX PO) Take by mouth daily as needed.     predniSONE (DELTASONE) 5 MG tablet 2 tabs qam x 2weeks, then 1 tab qam 60 tablet 3   Propylene Glycol (SYSTANE BALANCE OP) Place 1 drop into both eyes daily as needed (for dry eyes).     Tafamidis 61 MG CAPS Take 61 mg by mouth daily. 30 capsule 6   tiZANidine (ZANAFLEX) 4 MG tablet Take 0.5-1 tablets (2-4 mg total) by mouth at bedtime as needed for muscle spasms. 30 tablet 0   trolamine salicylate (ASPERCREME) 10 % cream Apply 1 application topically as needed for muscle pain.     warfarin (COUMADIN) 5 MG tablet TAKE 1 TO 3 TABLETS DAILY OR AS DIRECTED BY THE COUMADIN CLINIC 65 tablet 0   torsemide (DEMADEX) 20 MG tablet Take 2 tablets (40 mg total) by mouth daily. Ok to take extra dose if wt gain of 1 lb in 1 day or 5 lbs in 7 days 180 tablet 3   No current facility-administered medications for this visit.    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 01/2015 Echo: EF 55-60%, Gr 2 DD, mod LVH, mildly dil LA.   Heart disease    Hypertension    Hypothyroidism    Joint pain    Kidney problem    Lower extremity edema  Mini stroke    Non-obstructive CAD    a. 01/2015 Cardiolite: + inf wall ischemia, EF 56%;  b. 01/2015 Cath: LM nl, LAD 8m LCX min irregs, RCA dominant, 50-662m  Sleep apnea    Swallowing difficulty    Thyroid disease     PAST SURGICAL HISTORY: Past Surgical History:  Procedure Laterality Date   ABDOMINAL HYSTERECTOMY     BUBBLE STUDY  07/01/2020   Procedure: BUBBLE STUDY;  Surgeon: AcElouise MunroeMD;  [facility];;   BUBBLE STUDY  08/05/2020   Procedure: BUBBLE STUDY;  Surgeon: O'Geralynn RileMD;  [facility];;  done with definity    CARDIAC CATHETERIZATION     ESOPHAGOGASTRODUODENOSCOPY     a. 01/2015 EGD: patent esophagus.   ESOPHAGOGASTRODUODENOSCOPY N/A 01/20/2015   Procedure: ESOPHAGOGASTRODUODENOSCOPY (EGD);  Surgeon: PaCarol AdaMD;  [facility];  Service: Endoscopy;  Laterality: N/A;   HAND SURGERY     JOINT REPLACEMENT     reports history bilateral TKA and right TSA   LEFT HEART CATHETERIZATION WITH CORONARY ANGIOGRAM N/A 01/19/2015   TEE WITHOUT CARDIOVERSION  05/04/2020   TEE WITHOUT CARDIOVERSION N/A 05/05/2020   Procedure: TRANSESOPHAGEAL ECHOCARDIOGRAM (TEE);  Surgeon: HiPixie CasinoMD;  [facility];  Service: Cardiovascular;  Laterality: N/A;   TEE WITHOUT CARDIOVERSION N/A 07/01/2020   Procedure: TRANSESOPHAGEAL ECHOCARDIOGRAM (TEE);  Surgeon: AcElouise MunroeMD;  [facility];  Laterality: N/A;   TEE WITHOUT CARDIOVERSION N/A 08/05/2020   Procedure: TRANSESOPHAGEAL ECHOCARDIOGRAM (TEE);  Surgeon: O'Geralynn RileMD;  [facility];  Service: Cardiovascular;  Laterality: N/A;   THYROIDECTOMY       FAMILY HISTORY: Family History  Problem Relation Age of Onset   Heart disease Mother    Hypertension Mother    Cancer Father    Alcoholism Father     SOCIAL HISTORY: Social History   Socioeconomic History   Marital status: Widowed    Spouse name: Not on file   Number of children: Not on file   Years of education: Not on file   Highest education level: Not on file  Occupational History   Occupation: Retired  Tobacco Use   Smoking status: Never   Smokeless tobacco: Never  Vaping Use   Vaping Use: Never used  Substance and Sexual Activity   Alcohol use: No   Drug use: Not on file   Sexual activity: Not Currently  Other Topics Concern   Not on file  Social History Narrative   Lives with daughter   Social Determinants of Health   Financial Resource Strain: Low Risk    Difficulty of Paying Living Expenses: Not hard at all  Food Insecurity: No Food Insecurity   Worried About RuCharity fundraisern the Last Year: Never true   RaArboriculturistn the Last Year: Never true  Transportation Needs: No Transportation Needs   Lack of Transportation (Medical): No   Lack of Transportation (Non-Medical): No  Physical Activity: Inactive   Days of Exercise per Week: 0 days   Minutes of Exercise per Session: 0 min  Stress: No Stress Concern Present   Feeling of Stress : Not at all  Social Connections: Moderately Isolated   Frequency of Communication with Friends and Family: More than three times a week   Frequency of Social Gatherings with Friends and Family: Twice a week   Attends Religious Services: More than  4 times per year   Active Member of Clubs or Organizations: No   Attends Archivist Meetings: Never   Marital Status: Widowed  Intimate Partner Violence: Not At Risk   Fear of Current or Ex-Partner: No   Emotionally Abused: No   Physically Abused: No   Sexually Abused: No      Marcial Pacas, M.D. Ph.D.  [facility] [address],  [place], [place] [address] Ph: [phone] Fax: [phone]  CC:  [name], [name], MD [place],  [place] [id number]  [name], [name], MD

## 2021-08-12 ENCOUNTER — Telehealth: Payer: Self-pay | Admitting: Pharmacist

## 2021-08-12 ENCOUNTER — Other Ambulatory Visit: Payer: Self-pay | Admitting: Cardiovascular Disease

## 2021-08-12 ENCOUNTER — Telehealth: Payer: Self-pay | Admitting: Neurology

## 2021-08-12 LAB — SEDIMENTATION RATE: Sed Rate: 46 mm/hr — ABNORMAL HIGH (ref 0–40)

## 2021-08-12 LAB — HGB A1C W/O EAG: Hgb A1c MFr Bld: 7.9 % — ABNORMAL HIGH (ref 4.8–5.6)

## 2021-08-12 LAB — C-REACTIVE PROTEIN: CRP: 20 mg/L — ABNORMAL HIGH (ref 0–10)

## 2021-08-12 NOTE — Chronic Care Management (AMB) (Addendum)
Chronic Care Management Pharmacy Assistant   Name: Regina [name]  MRN: [id number] DOB: 1941/03/13   Reason for Encounter: Medication Review/ Medication Coordination Call.     Recent office visits:  None.  Recent consult visits:  08/11/21 Regina Hansen MD (Neurology) - seen for polymyalgia rheumatica. Patient started on duloxetine 42m daily. Decreased prednisone 559mto 52m72maily with breakfast. Follow up in 3 months.   08/02/21 Regina [name] (Cardiology) - seen for anticoagulation visit in office. No medication change. Return for next INR.  Hospital visits:  None in previous 6 months  Medications: Outpatient Encounter Medications as of 08/12/2021  Medication Sig Note   Alcohol Swabs (B-D SINGLE USE SWABS REGULAR) PADS Use to test blood sugar up to 3 times daily    allopurinol (ZYLOPRIM) 100 MG tablet Take 1 tablet (100 mg total) by mouth daily.    atorvastatin (LIPITOR) 20 MG tablet Take 1 tablet (20 mg total) by mouth daily.    Blood Glucose Monitoring Suppl (ACCU-CHEK GUIDE) w/Device KIT 1 Device by Does not apply route 3 (three) times daily.    Capsaicin 0.033 % CREA Apply 1 application topically 3 (three) times daily.    cetirizine (ZYRTEC) 10 MG tablet Take 10 mg by mouth daily.    Cholecalciferol (VITAMIN D3) 50 MCG (2000 UT) capsule Take 2,000 Units by mouth daily.    Continuous Blood Gluc Sensor (DEXCOM G6 SENSOR) MISC 1 Device by Does not apply route as directed.    Continuous Blood Gluc Transmit (DEXCOM G6 TRANSMITTER) MISC 1 Device by Does not apply route as directed.    diclofenac Sodium (VOLTAREN) 1 % GEL Apply 1 application topically daily as needed (arthritis pain/hands).    DULoxetine (CYMBALTA) 60 MG capsule Take 1 capsule (60 mg total) by mouth daily.    famotidine (PEPCID) 20 MG tablet Take 1 tablet (20 mg total) by mouth at bedtime.    FARXIGA 10 MG TABS tablet Take 1 tablet (10 mg total) by mouth daily.    fluticasone (FLONASE) 50 MCG/ACT nasal spray  Place 1 spray into both nostrils daily.    gabapentin (NEURONTIN) 600 MG tablet TAKE ONE TABLET BY MOUTH EVERY MORNING and TAKE ONE TABLET BY MOUTH EVERYDAY AT BEDTIME    glucose blood (ACCU-CHEK GUIDE) test strip Use to test blood sugar up to 3 times daily    insulin glargine, 1 Unit Dial, (TOUJEO SOLOSTAR) 300 UNIT/ML Solostar Pen Inject 26 Units into the skin daily. Eat a snack with protein nightly before bedtime.    insulin lispro (HUMALOG KWIKPEN) 100 UNIT/ML KwikPen Max daily 15 units    Insulin Pen Needle (PEN NEEDLES) 31G X 8 MM MISC Check blood sugar 3-4 times a day as directed    Insulin Pen Needle 32G X 4 MM MISC 1 Device by Does not apply route in the morning, at noon, in the evening, and at bedtime.    Lancets Misc. (ACCU-CHEK FASTCLIX LANCET) KIT Use to test blood sugar up to 3 times daily    levothyroxine (SYNTHROID) 150 MCG tablet Take 1 tablet (150 mcg total) by mouth daily before breakfast.    magnesium oxide (MAG-OX) 400 (240 Mg) MG tablet Take 1 tablet (400 mg total) by mouth daily.    meclizine (ANTIVERT) 25 MG tablet Take 25 mg by mouth daily as needed for dizziness.  01/08/2021: prn   metoprolol succinate (TOPROL-XL) 25 MG 24 hr tablet Take 1 tablet (25 mg total) by mouth daily. Take with or  immediately following a meal.    nitroGLYCERIN (NITROSTAT) 0.4 MG SL tablet Place 1 tablet (0.4 mg total) under the tongue every 5 (five) minutes as needed for chest pain.    Polyethylene Glycol 3350 (MIRALAX PO) Take by mouth daily as needed.    predniSONE (DELTASONE) 1 MG tablet Take 4 tablets (4 mg total) by mouth daily with breakfast.    predniSONE (DELTASONE) 5 MG tablet 2 tabs qam x 2weeks, then 1 tab qam    Propylene Glycol (SYSTANE BALANCE OP) Place 1 drop into both eyes daily as needed (for dry eyes).    Tafamidis 61 MG CAPS Take 61 mg by mouth daily.    tiZANidine (ZANAFLEX) 4 MG tablet Take 0.5-1 tablets (2-4 mg total) by mouth at bedtime as needed for muscle spasms.     torsemide (DEMADEX) 20 MG tablet Take 2 tablets (40 mg total) by mouth daily. Ok to take extra dose if wt gain of 1 lb in 1 day or 5 lbs in 7 days    trolamine salicylate (ASPERCREME) 10 % cream Apply 1 application topically as needed for muscle pain.    warfarin (COUMADIN) 5 MG tablet TAKE 1 TO 3 TABLETS DAILY OR AS DIRECTED BY THE COUMADIN CLINIC    No facility-administered encounter medications on file as of 08/12/2021.   Fill History: allopurinol 100 mg tablet 07/22/2021 30   atorvastatin 20 mg tablet 07/22/2021 30   duloxetine 60 mg capsule,delayed release 08/11/2021 17   Farxiga 10 mg tablet 07/22/2021 30   gabapentin 600 mg tablet 07/22/2021 30   Toujeo SoloStar U-300 Insulin 300 unit/mL (1.5 mL) subcutaneous pen 07/15/2021 52   Humalog KwikPen (U-100) Insulin 100 unit/mL subcutaneous 07/15/2021 100   Comfort EZ Pen Needles 31 gauge x 5/16" 07/15/2021 25   levothyroxine 150 mcg tablet 07/22/2021 30   magnesium oxide 400 mg (241.3 mg magnesium) tablet 07/22/2021 30   metoprolol succinate ER 25 mg tablet,extended release 24 hr 07/26/2021 30   nitroglycerin 0.4 mg sublingual tablet 08/10/2021 30   torsemide 20 mg tablet 07/22/2021 36   warfarin 5 mg tablet 07/22/2021 22   prednisone 1 mg tablet 08/11/2021 17   Reviewed chart for medication changes ahead of medication coordination call.  BP Readings from Last 3 Encounters:  08/11/21 95/63  07/12/21 118/64  06/18/21 (!) 112/58    Lab Results  Component Value Date   HGBA1C 7.9 (H) 08/11/2021     Patient obtains medications through Adherence Packaging  30 Days   Last adherence delivery included:  Allopurinol 100 mg 1 tablet at breakfast Atorvastatin 20 mg 1 tablet at bedtime Cetirizine 10 mg 1 tablet at breakfast Levothyroxine 150 mcg 1 tablet before breakfast Farxiga 10 mg 1 tablet at breakfast Gabapentin 600 mg 1 tablet at breakfast and at bedtime Magnesium oxide 400 mg 1 tablet at breakfast Metoprolol  succinate 25 mg 1 tablet at breakfast Warfarin 5 mg tablet (easy top vials) Torsemide 20 mg 2 tablets at breakfast and as needed (extra in an easy top vial)    Patient declined medications: Vitamin C (no longer taking) Vitamin D (gets cheaper through [facility])   Patient is due for next adherence delivery on: 08/25/21. Called patient and reviewed medications and coordinated delivery.  This delivery to include: Toujeo solostar U-300 injection  Pen needles 8 mm x 31 g x 5/16 (patient prefers these for both Toujeo and Humalog) Gabapentin 600 mg tablet  1 tablet at breakfast and 1 tablet at dinner Metoprolol succinate  25 mg tablets 1 tablet at breakfast Allopurinol 100 mg 1 tablet at breakfast Atorvastatin 20 mg 1 tablet at bedtime Cetirizine 10 mg 1 tablet at breakfast Levothyroxine 150 mcg 1 tablet before breakfast Farxiga 10 mg 1 tablet at breakfast Magnesium oxide 400 mg 1 tablet at breakfast Warfarin 5 mg tablet (easy top vials) Torsemide 20 mg 2 tablets at breakfast and as needed (extra in an easy top vial) ACCU CHEK test strips - use to check blood sugar three times daily. Prednisone 64m - patient is currently taking 4 1 mg tablets daily and is tapering down by 1 tablet every 2 weeks until done.   Coordinated acute fill for Pen Needles 31G X 5/16. On 08/16/21.  Patient declined the following medications: Vitamin C (no longer taking) [facility]  duloxetine 687m- take 1 capsule by mouth daily.   Confirmed delivery date of 08/25/21, advised patient that pharmacy will contact them the morning of delivery.   Care Gaps:  AWV - scheduled for 02/16/22 Zoster vaccines - due Urine microalbumin - over due since 05/21/20 Foot exam - overdue since 09/08/20 Covid-19 vaccine booster 4 - overdue since 11/04/20 Flu vaccine - due  Star Rating Drugs:  Atorvastatin 2070m [facility] [place] [facility] [email or web address] [place] [phone]

## 2021-08-12 NOTE — Telephone Encounter (Signed)
Please call patient, repeat laboratory evaluation continue to show elevated C-reactive protein 20, ESR of 46,  A1c was higher 7.9  Please advise patient continue tapering down prednisone as discussed during office visit, also carefully observing her symptoms, if her pain recurs

## 2021-08-12 NOTE — Telephone Encounter (Signed)
Pt verified by name and DOB,  normal results given per provider, pt voiced understanding all question answered. °

## 2021-08-13 ENCOUNTER — Encounter: Payer: Self-pay | Admitting: Internal Medicine

## 2021-08-13 ENCOUNTER — Ambulatory Visit (INDEPENDENT_AMBULATORY_CARE_PROVIDER_SITE_OTHER): Payer: Medicare Other | Admitting: Internal Medicine

## 2021-08-13 ENCOUNTER — Other Ambulatory Visit: Payer: Self-pay

## 2021-08-13 VITALS — BP 120/78 | HR 80 | Ht 62.0 in | Wt 221.0 lb

## 2021-08-13 DIAGNOSIS — E11319 Type 2 diabetes mellitus with unspecified diabetic retinopathy without macular edema: Secondary | ICD-10-CM

## 2021-08-13 DIAGNOSIS — E1140 Type 2 diabetes mellitus with diabetic neuropathy, unspecified: Secondary | ICD-10-CM | POA: Diagnosis not present

## 2021-08-13 DIAGNOSIS — E1122 Type 2 diabetes mellitus with diabetic chronic kidney disease: Secondary | ICD-10-CM | POA: Diagnosis not present

## 2021-08-13 DIAGNOSIS — Z794 Long term (current) use of insulin: Secondary | ICD-10-CM

## 2021-08-13 DIAGNOSIS — E1159 Type 2 diabetes mellitus with other circulatory complications: Secondary | ICD-10-CM | POA: Diagnosis not present

## 2021-08-13 DIAGNOSIS — E785 Hyperlipidemia, unspecified: Secondary | ICD-10-CM

## 2021-08-13 DIAGNOSIS — N1831 Chronic kidney disease, stage 3a: Secondary | ICD-10-CM

## 2021-08-13 LAB — BASIC METABOLIC PANEL
BUN: 59 mg/dL — ABNORMAL HIGH (ref 6–23)
CO2: 35 mEq/L — ABNORMAL HIGH (ref 19–32)
Calcium: 9.5 mg/dL (ref 8.4–10.5)
Chloride: 94 mEq/L — ABNORMAL LOW (ref 96–112)
Creatinine, Ser: 1.94 mg/dL — ABNORMAL HIGH (ref 0.40–1.20)
GFR: 24.47 mL/min — ABNORMAL LOW (ref >60.00)
Glucose, Bld: 190 mg/dL — ABNORMAL HIGH (ref 70–99)
Potassium: 3.5 mEq/L (ref 3.5–5.1)
Sodium: 137 mEq/L (ref 135–145)

## 2021-08-13 LAB — LIPID PANEL
Cholesterol: 115 mg/dL (ref 0–200)
HDL: 54.8 mg/dL (ref >39.00)
LDL Cholesterol: 40 mg/dL (ref 0–99)
NonHDL: 60.32
Total CHOL/HDL Ratio: 2
Triglycerides: 101 mg/dL (ref 0.0–149.0)
VLDL: 20.2 mg/dL (ref 0.0–40.0)

## 2021-08-13 LAB — MICROALBUMIN / CREATININE URINE RATIO
Creatinine,U: 16.4 mg/dL
Microalb Creat Ratio: 4.3 mg/g (ref 0.0–30.0)
Microalb, Ur: <0.7 mg/dL (ref 0.0–1.9)

## 2021-08-13 LAB — POCT GLYCOSYLATED HEMOGLOBIN (HGB A1C): Hemoglobin A1C: 7.6 % — AB (ref 4.0–5.6)

## 2021-08-13 NOTE — Patient Instructions (Addendum)
-   Continue Toujeo  26 units daily  - HUmalog 5 units with each meal  - Humalog correctional insulin: ADD extra units on insulin to your meal-time Humalog dose if your blood sugars are higher than 160. Use the scale below to help guide you:   Blood sugar before meal Number of units to inject  Less than 160 0 unit  161 -  190 1 units  191 -  220 2 units  221 -  250 3 units  251 -  280 4 units  281 -  310 5 units  311 -  340 6 units  341 -  370 7 units  371 -  400 8 units     HOW TO TREAT LOW BLOOD SUGARS (Blood sugar LESS THAN 70 MG/DL) Please follow the RULE OF 15 for the treatment of hypoglycemia treatment (when your (blood sugars are less than 70 mg/dL)   STEP 1: Take 15 grams of carbohydrates when your blood sugar is low, which includes:  3-4 GLUCOSE TABS  OR 3-4 OZ OF JUICE OR REGULAR SODA OR ONE TUBE OF GLUCOSE GEL    STEP 2: RECHECK blood sugar in 15 MINUTES STEP 3: If your blood sugar is still low at the 15 minute recheck --> then, go back to STEP 1 and treat AGAIN with another 15 grams of carbohydrates.

## 2021-08-13 NOTE — Progress Notes (Signed)
Name: Regina [name]  Age/ Sex: 80 y.o., female   MRN/ DOB: [id number], [birth year]     PCP: [name], [name], MD   Reason for Endocrinology Evaluation: Type 2 Diabetes Mellitus  Initial Endocrine Consultative Visit: 09/09/2020    PATIENT IDENTIFIER: Regina [name] is a 80 y.o. female with a past medical history of T2DM, HTN, CAD, OSA on CPAP  and A.Fib. The patient has followed with Endocrinology clinic since 09/09/2020 for consultative assistance with management of her diabetes.  DIABETIC HISTORY:  Ms. Blankenbeckler was diagnosed with DM yrs ago. Her hemoglobin A1c has ranged from 6.9% in 2021, peaking at 7.8% in 2020   On her initial visit to our clinic she had an A1c of 7.4% She was on basal insulin and Ozemopic, she was already out of Ozempic and we held off until more data was available about her retinopathy. We started humalog per correction scale   She was started on Farxiga by cardiology 10/930 Started Trulicity   Nephrology [facility] - Dr. peoples  SUBJECTIVE:   During the last visit (05/14/2021): A1c 6.8 % We continued Toujeo and humaog per CS and started Trulicity     Today (35/57/3220): Ms. Browder is here for a follow up on diabetes management.  She is accompanied by her son Randall Hiss  . She checks her blood sugars multiple times a day through CGM . The patient has not had hypoglycemic episodes since the last clinic visit  She is on chronic prednisone for Polymyagia rheumatica, currently on 4 mg and tapering down  No nausea  vomiting or diarrhea    Trulicity  cost prohibitive  She was prescribed Cymbalta   HOME DIABETES REGIMEN:  Toujeo 26 units daily  CF: Humalog: ( BG-120/30) Farxiga 10 mg daily-through cardiology Trulicity 2.54 mg weekly - not taking    Statin: yes ACE-I/ARB: no    CONTINUOUS GLUCOSE MONITORING RECORD INTERPRETATION    Dates of Recording: 10/15-10/28/2022  Sensor description:dexcom  Results statistics:   CGM use % of  time 100  Average and SD 191/55  Time in range     45 %  % Time Above 180 39  % Time above 250 15  % Time Below target <1     Glycemic patterns summary: optimal BG's at night but high during the day   Hyperglycemic episodes  postprandial mainly lunch and dinner  Hypoglycemic episodes occurred n/a  Overnight periods: trends down    DIABETIC COMPLICATIONS: Microvascular complications:  CKD III, Retinopathy ( hx of laser )  Denies: neuropathy  Last eye exam: Completed 2021   Macrovascular complications:  Non-obstructive CAD  Denies: PVD, CVA     HISTORY:  Past Medical History:  Past Medical History:  Diagnosis Date   Back pain    CHF (congestive heart failure) (HCC)    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 01/2015 Echo: EF 55-60%, Gr 2 DD, mod LVH, mildly dil LA.   Heart disease    Hypertension    Hypothyroidism    Joint pain    Kidney problem    Lower extremity edema    Mini stroke    Non-obstructive CAD    a. 01/2015 Cardiolite: + inf wall ischemia, EF 56%;  b. 01/2015 Cath:  LM nl, LAD 52m LCX min irregs, RCA dominant, 50-625m  Sleep apnea    Swallowing difficulty    Thyroid disease    Past Surgical History:  Past Surgical History:  Procedure Laterality Date   ABDOMINAL HYSTERECTOMY     BUBBLE STUDY  07/01/2020   Procedure: BUBBLE STUDY;  Surgeon: AcElouise MunroeMD;  [place] [facility]: Cardiovascular;;   BUBBLE STUDY  08/05/2020   Procedure: BUBBLE STUDY;  Surgeon: O'Geralynn RileMD;  [place] [facility];;  done with definity    CARDIAC CATHETERIZATION     ESOPHAGOGASTRODUODENOSCOPY     a. 01/2015 EGD: patent esophagus.   ESOPHAGOGASTRODUODENOSCOPY N/A 01/20/2015   Procedure:  ESOPHAGOGASTRODUODENOSCOPY (EGD);  Surgeon: PaCarol AdaMD;  [facility];  Service: Endoscopy;  Laterality: N/A;   HAND SURGERY     JOINT REPLACEMENT     reports history bilateral TKA and right TSA   LEFT HEART CATHETERIZATION WITH CORONARY ANGIOGRAM N/A 01/19/2015   TEE WITHOUT CARDIOVERSION  05/04/2020   TEE WITHOUT CARDIOVERSION N/A 05/05/2020   Procedure: TRANSESOPHAGEAL ECHOCARDIOGRAM (TEE);  Surgeon: HiPixie CasinoMD;  [facility];  Service: Cardiovascular;  Laterality: N/A;   TEE WITHOUT CARDIOVERSION N/A 07/01/2020   Procedure: TRANSESOPHAGEAL ECHOCARDIOGRAM (TEE);  Surgeon: AcElouise MunroeMD;  [place] [facility] [facility]: Cardi[facility];  Laterality: N/A;   TEE WITHOUT CARDIOVERSION N/A 08/05/2020   Procedure: TRANSESOPHAGEAL ECHOCARDIOGRAM (TEE);  Surgeon: O'Geralynn RileMD;  [place] [facility] Cardi[facility];  Laterality: N/A;   THYROIDECTOMY     Social History:  reports that she has never smoked. She has never used smokeless tobacco. She reports that she does not drink alcohol. No history on file for drug use. Family History:  Family History  Problem Relation Age of Onset   Heart disease Mother    Hypertension Mother    Cancer Father    Alcoholism Father      HOME MEDICATIONS: Allergies as of 08/13/2021       Reactions   Ace Inhibitors Other (See Comments)   unknown   Amlodipine Other (See Comments)   unknown   Atenolol Other (See Comments)   bradycardia   Avandia [rosiglitazone] Other (See Comments)   unknown   Darvon [propoxyphene] Other (See Comments)   unknown   Erythromycin Itching   Hydralazine Other (See Comments)   Burning in throat and chest   Hydrocodone Other (See Comments)   Hallucinations.   Levofloxacin Itching   Morphine And Related Other (See Comments)   Dizzy and hallucianation, vomiting; Willing to try low dose   Percocet [oxycodone-acetaminophen] Other (See Comments)    hallucination   Spironolactone Other (See Comments)   unknown   Tramadol Other (See Comments)   Unknown/does not recall reaction but does not want to take again        Medication List        Accurate as of August 13, 2021  2:33 PM. If you have any questions, ask your nurse or doctor.          Accu-Chek FaLucent Technologiesit Use to test blood sugar up to 3 times daily   Accu-Chek Guide test strip Generic drug: glucose blood Use to test blood sugar up to 3 times daily   Accu-Chek Guide w/Device Kit 1 Device by Does not apply route 3 (three) times daily.   allopurinol 100 MG tablet Commonly known as: ZYLOPRIM Take 1 tablet (100 mg total) by  mouth daily.   atorvastatin 20 MG tablet Commonly known as: LIPITOR Take 1 tablet (20 mg total) by mouth daily.   B-D SINGLE USE SWABS REGULAR Pads Use to test blood sugar up to 3 times daily   Capsaicin 0.033 % Crea Apply 1 application topically 3 (three) times daily.   cetirizine 10 MG tablet Commonly known as: ZYRTEC Take 10 mg by mouth daily.   Dexcom G6 Sensor Misc 1 Device by Does not apply route as directed.   Dexcom G6 Transmitter Misc 1 Device by Does not apply route as directed.   diclofenac Sodium 1 % Gel Commonly known as: VOLTAREN Apply 1 application topically daily as needed (arthritis pain/hands).   DULoxetine 60 MG capsule Commonly known as: Cymbalta Take 1 capsule (60 mg total) by mouth daily.   famotidine 20 MG tablet Commonly known as: PEPCID Take 1 tablet (20 mg total) by mouth at bedtime.   Farxiga 10 MG Tabs tablet Generic drug: dapagliflozin propanediol Take 1 tablet (10 mg total) by mouth daily.   fluticasone 50 MCG/ACT nasal spray Commonly known as: FLONASE Place 1 spray into both nostrils daily.   gabapentin 600 MG tablet Commonly known as: NEURONTIN TAKE ONE TABLET BY MOUTH EVERY MORNING and TAKE ONE TABLET BY MOUTH EVERYDAY AT BEDTIME   insulin lispro 100 UNIT/ML KwikPen Commonly  known as: HumaLOG KwikPen Max daily 15 units   Insulin Pen Needle 32G X 4 MM Misc 1 Device by Does not apply route in the morning, at noon, in the evening, and at bedtime.   Pen Needles 31G X 8 MM Misc Check blood sugar 3-4 times a day as directed   levothyroxine 150 MCG tablet Commonly known as: SYNTHROID Take 1 tablet (150 mcg total) by mouth daily before breakfast.   magnesium oxide 400 (240 Mg) MG tablet Commonly known as: MAG-OX Take 1 tablet (400 mg total) by mouth daily.   meclizine 25 MG tablet Commonly known as: ANTIVERT Take 25 mg by mouth daily as needed for dizziness.   metoprolol succinate 25 MG 24 hr tablet Commonly known as: TOPROL-XL Take 1 tablet (25 mg total) by mouth daily. Take with or immediately following a meal.   MIRALAX PO Take by mouth daily as needed.   nitroGLYCERIN 0.4 MG SL tablet Commonly known as: NITROSTAT Place 1 tablet (0.4 mg total) under the tongue every 5 (five) minutes as needed for chest pain.   predniSONE 5 MG tablet Commonly known as: DELTASONE 2 tabs qam x 2weeks, then 1 tab qam   predniSONE 1 MG tablet Commonly known as: DELTASONE Take 4 tablets (4 mg total) by mouth daily with breakfast.   SYSTANE BALANCE OP Place 1 drop into both eyes daily as needed (for dry eyes).   Tafamidis 61 MG Caps Take 61 mg by mouth daily.   tiZANidine 4 MG tablet Commonly known as: Zanaflex Take 0.5-1 tablets (2-4 mg total) by mouth at bedtime as needed for muscle spasms.   torsemide 20 MG tablet Commonly known as: DEMADEX Take 2 tablets (40 mg total) by mouth daily. Ok to take extra dose if wt gain of 1 lb in 1 day or 5 lbs in 7 days   Toujeo SoloStar 300 UNIT/ML Solostar Pen Generic drug: insulin glargine (1 Unit Dial) Inject 26 Units into the skin daily. Eat a snack with protein nightly before bedtime.   trolamine salicylate 10 % cream Commonly known as: ASPERCREME Apply 1 application topically as needed for muscle pain.  Vitamin  D3 50 MCG (2000 UT) capsule Take 2,000 Units by mouth daily.   warfarin 5 MG tablet Commonly known as: COUMADIN Take as directed by the anticoagulation clinic. If you are unsure how to take this medication, talk to your nurse or doctor. Original instructions: Take 1-3 tablets by MOUTH daily OR as directed by THE coumadin clinic         OBJECTIVE:   Vital Signs: BP 120/78 (BP Location: Left Arm, Patient Position: Sitting, Cuff Size: Small)   Pulse 80   Ht 5' 2"  (1.575 m)   Wt 221 lb (100.2 kg)   SpO2 98%   BMI 40.42 kg/m   Wt Readings from Last 3 Encounters:  08/13/21 221 lb (100.2 kg)  08/11/21 220 lb (99.8 kg)  07/12/21 221 lb 6.4 oz (100.4 kg)     Exam: General: Pt appears well and is in NAD  Lungs: Clear with good BS bilat   Heart: RRR   Extremities: No pretibial edema.   Neuro: MS is good with appropriate affect, pt is alert and Ox3     DM foot exam: 05/14/2021   The skin of the feet is intact without sores or ulcerations. The pedal pulses are undetectable The sensation is decreased to a screening 5.07, 10 gram monofilament bilaterally   DATA REVIEWED:  Lab Results  Component Value Date   HGBA1C 7.6 (A) 08/13/2021   HGBA1C 7.9 (H) 08/11/2021   HGBA1C 7.2 (H) 06/18/2021   Results for LUDDIE, BOGHOSIAN (MRN [id number]) as of 08/16/2021 14:45  Ref. Range 08/13/2021 15:01  Sodium Latest Ref Range: 135 - 145 mEq/L 137  Potassium Latest Ref Range: 3.5 - 5.1 mEq/L 3.5  Chloride Latest Ref Range: 96 - 112 mEq/L 94 (L)  CO2 Latest Ref Range: 19 - 32 mEq/L 35 (H)  Glucose Latest Ref Range: 70 - 99 mg/dL 190 (H)  BUN Latest Ref Range: 6 - 23 mg/dL 59 (H)  Creatinine Latest Ref Range: 0.40 - 1.20 mg/dL 1.94 (H)  Calcium Latest Ref Range: 8.4 - 10.5 mg/dL 9.5  GFR Latest Ref Range: >60.00 mL/min 24.47 (L)  Total CHOL/HDL Ratio Unknown 2  Cholesterol Latest Ref Range: 0 - 200 mg/dL 115  HDL Cholesterol Latest Ref Range: >39.00 mg/dL 54.80  LDL (calc) Latest Ref  Range: 0 - 99 mg/dL 40  MICROALB/CREAT RATIO Latest Ref Range: 0.0 - 30.0 mg/g 4.3  NonHDL Unknown 60.32  Triglycerides Latest Ref Range: 0.0 - 149.0 mg/dL 101.0  VLDL Latest Ref Range: 0.0 - 40.0 mg/dL 20.2  Creatinine,U Latest Units: mg/dL 16.4  Microalb, Ur Latest Ref Range: 0.0 - 1.9 mg/dL <0.7    ASSESSMENT / PLAN / RECOMMENDATIONS:   1) Type 2 Diabetes Mellitus, Sub-Optimally controlled, With retinopathic, neuropathic , CKD IV and Macrovascular   complications - Most recent A1c of 7.6 %. Goal A1c < 7.5 %.    -Currently on Prednisone for polymyalgia rheumatica, but A1c is trending down  - She has not been able to start Trulicity due to cost  -She is on Farxiga through cardiology - Will restart a standing dose of prandial insulin to be used in addition to correction scale due to hyperglycemia  - BMP shows stable renal function   MEDICATIONS: Continue Toujeo  26 units daily  Humalog 5 units with each meal  Correction Scale  : Humalog ( BG -125/30) Patient on Farxiga 10 mg to cardiology    EDUCATION / INSTRUCTIONS: BG monitoring instructions: Patient is instructed to check  her blood sugars 3 times a day, before meals . Call [facility] if: BG persistently < 70  I reviewed the Rule of 15 for the treatment of hypoglycemia in detail with the patient. Literature supplied.      2) Diabetic complications:  Eye: Does  have known diabetic retinopathy.  Neuro/ Feet: Does have known diabetic peripheral neuropathy. Renal: Patient does have known baseline CKD. She is not on an ACEI/ARB at present.    3) Dyslipidemia :  - LDL at goal   Continue Atorvastatin 20 mg daily    F/U in 4 months    Signed electronically by: Mack Guise, MD  [facility] End[facility]  [facility] [address] [place]  [address] Phone: [phone] FAX: [phone]   CC: [name], [name], [place]  [address] Phone: [phone]  Fax: [phone]  Return to Endocrinology clinic as below: Future Appointments  Date Time Provider [place]  08/20/2021  3:00 PM LBCT-CT 1 LBCT-CT LB-CT [facility]  08/23/2021  3:30 PM [facility]  11/23/2021 10:00 AM O'Neal, Cassie Freer, MD [facility]  11/25/2021  1:30 PM Marcial Pacas, MD GNA-GNA None  02/16/2022  9:30 AM LBPC-NURSE HEALTH ADVISOR 2 LBPC-BF P[facility]

## 2021-08-16 ENCOUNTER — Telehealth: Payer: Self-pay | Admitting: Cardiovascular Disease

## 2021-08-16 DIAGNOSIS — Z794 Long term (current) use of insulin: Secondary | ICD-10-CM | POA: Diagnosis not present

## 2021-08-16 DIAGNOSIS — E119 Type 2 diabetes mellitus without complications: Secondary | ICD-10-CM | POA: Diagnosis not present

## 2021-08-16 NOTE — Telephone Encounter (Signed)
Called patient left Dr.O'Neal's advice on personal voice mail.Advised to call back if she continues to have swelling.

## 2021-08-16 NOTE — Telephone Encounter (Signed)
Pt updated with MD's recommendation and verbalized understanding.  

## 2021-08-16 NOTE — Telephone Encounter (Signed)
Patient returning call in regards to her swelling. See previous phone note.

## 2021-08-16 NOTE — Telephone Encounter (Signed)
Please see previous encounter

## 2021-08-16 NOTE — Telephone Encounter (Signed)
Spoke to patient she stated she gained 2 lbs over night.She is having swelling in both arms and hands.Stated no swelling in lower legs and feet.She is sob.Stated neurologist prescribed prednisone 1 month ago.She will be on prednisone for 2 more months.Advised ok to take a extra Torsemide 20 mg now.I will send message to Dr.O'Neal for advice.

## 2021-08-16 NOTE — Telephone Encounter (Signed)
   Pt c/o swelling: STAT is pt has developed SOB within 24 hours  If swelling, where is the swelling located? Hands, wrist, legs, ankles  How much weight have you gained and in what time span? 6 lbs   Have you gained 3 pounds in a day or 5 pounds in a week? 5 lbs in a day  Do you have a log of your daily weights (if so, list)? 10/27 217 lbs, 10/28 221 lbs, 10/31 222 lbs  Are you currently taking a fluid pill? Yes   Are you currently SOB? No   Have you traveled recently? No  Pt said she continuity gaining weight and her swelling is not getting better, she also feels tired all the time

## 2021-08-18 ENCOUNTER — Other Ambulatory Visit: Payer: Medicare Other

## 2021-08-18 DIAGNOSIS — H04123 Dry eye syndrome of bilateral lacrimal glands: Secondary | ICD-10-CM | POA: Diagnosis not present

## 2021-08-18 DIAGNOSIS — M353 Polymyalgia rheumatica: Secondary | ICD-10-CM | POA: Diagnosis not present

## 2021-08-18 DIAGNOSIS — E119 Type 2 diabetes mellitus without complications: Secondary | ICD-10-CM | POA: Diagnosis not present

## 2021-08-18 DIAGNOSIS — H43392 Other vitreous opacities, left eye: Secondary | ICD-10-CM | POA: Diagnosis not present

## 2021-08-18 DIAGNOSIS — H25811 Combined forms of age-related cataract, right eye: Secondary | ICD-10-CM | POA: Diagnosis not present

## 2021-08-19 ENCOUNTER — Telehealth: Payer: Self-pay

## 2021-08-19 DIAGNOSIS — E854 Organ-limited amyloidosis: Secondary | ICD-10-CM

## 2021-08-19 MED ORDER — TAFAMIDIS 61 MG PO CAPS: 61.0000 mg | ORAL_CAPSULE | Freq: Every day | ORAL | 12 refills | Status: DC

## 2021-08-19 MED ORDER — TAFAMIDIS 61 MG PO CAPS
61.0000 mg | ORAL_CAPSULE | Freq: Every day | ORAL | 12 refills | Status: DC
Start: 2021-08-19 — End: 2021-08-19

## 2021-08-19 NOTE — Telephone Encounter (Signed)
Received message in [facility] that patient needs refill for Vyndamax (Tafamidis) 61 mg.  Please assist.  Thank you

## 2021-08-19 NOTE — Addendum Note (Signed)
Addended by: Rockne Menghini on: 08/19/2021 04:38 PM   Modules accepted: Orders

## 2021-08-20 ENCOUNTER — Ambulatory Visit (INDEPENDENT_AMBULATORY_CARE_PROVIDER_SITE_OTHER)
Admission: RE | Admit: 2021-08-20 | Discharge: 2021-08-20 | Disposition: A | Discharge status: Home / Self Care | Payer: Medicare Other | Source: Ambulatory Visit | Attending: Pulmonary Disease | Admitting: Pulmonary Disease

## 2021-08-20 ENCOUNTER — Other Ambulatory Visit: Payer: Self-pay

## 2021-08-20 DIAGNOSIS — I517 Cardiomegaly: Secondary | ICD-10-CM | POA: Diagnosis not present

## 2021-08-20 DIAGNOSIS — R911 Solitary pulmonary nodule: Secondary | ICD-10-CM | POA: Diagnosis not present

## 2021-08-20 DIAGNOSIS — R918 Other nonspecific abnormal finding of lung field: Secondary | ICD-10-CM | POA: Diagnosis not present

## 2021-08-23 ENCOUNTER — Ambulatory Visit: Payer: Medicare Other

## 2021-08-23 ENCOUNTER — Other Ambulatory Visit: Payer: Self-pay

## 2021-08-23 DIAGNOSIS — I4819 Other persistent atrial fibrillation: Secondary | ICD-10-CM | POA: Diagnosis not present

## 2021-08-23 DIAGNOSIS — Z7901 Long term (current) use of anticoagulants: Secondary | ICD-10-CM | POA: Diagnosis not present

## 2021-08-23 LAB — POCT INR: INR: 2.1 (ref 2.0–3.0)

## 2021-08-23 NOTE — Patient Instructions (Signed)
Continue taking 1/2 tablet daily except 1 tablet each Sunday.  Repeat INR in 5 weeks; [phone];  ° °

## 2021-08-24 ENCOUNTER — Telehealth: Payer: Self-pay | Admitting: Cardiovascular Disease

## 2021-08-24 ENCOUNTER — Telehealth: Payer: Self-pay

## 2021-08-24 NOTE — Telephone Encounter (Signed)
Pt updated with MD's recommendations and verbalized understanding.  Appointment scheduled for 11/16 with Dr. Sharlene Dory, Cassie Freer, MD  You; Caprice Beaver, LPN 36 minutes ago (87:06 AM)   Would recommend she take an extra tablet this afternoon.   Can we get her into see me or an APP in 1-2 weeks.   [name], MD, [place]  [address] [place], [place] [address]  [phone]  11:29 AM

## 2021-08-24 NOTE — Telephone Encounter (Signed)
**Note De-Identified Regina [name] Obfuscation** I attempted to do this [place] PA through covermymeds but have received a message X 2 that a PA is not required.  I called [facility] and was advised that that a PA is not required as the pts ins is paying their part but that a 30 day supply will cost the pt $130/30 day supply and that this cost maybe due to a deductible.

## 2021-08-24 NOTE — Telephone Encounter (Signed)
Returned call to pt. She report swelling in wrist, ankles, and legs. She denies SOB but report yesterday she weighed 218 and this morning she is 223. Pt state she has already taken her scheduled dose of Torsemide 40 mg an hour ago and requesting further recommendations.    Will forward to MD

## 2021-08-24 NOTE — Telephone Encounter (Signed)
Received paperwork that patient's Wilder Glade has been rejected and requires prior auth.   Key: [id number] Last name: Lute  DOB: 15-Aug-1941

## 2021-08-24 NOTE — Telephone Encounter (Signed)
Pt c/o swelling: STAT is pt has developed SOB within 24 hours  If swelling, where is the swelling located? Hands, and ankles  How much weight have you gained and in what time span? 5 lbs in a day  Have you gained 3 pounds in a day or 5 pounds in a week? yes  Do you have a log of your daily weights (if so, list)? 45 yesterday 223 today  Are you currently taking a fluid pill? yes  Are you currently SOB? no  Have you traveled recently? No  She is currently take a steroid.

## 2021-09-01 ENCOUNTER — Encounter: Payer: Self-pay | Admitting: Cardiovascular Disease

## 2021-09-01 ENCOUNTER — Ambulatory Visit: Payer: Medicare Other | Admitting: Cardiovascular Disease

## 2021-09-01 ENCOUNTER — Other Ambulatory Visit: Payer: Self-pay

## 2021-09-01 VITALS — BP 135/84 | HR 94 | Ht 62.0 in | Wt 224.6 lb

## 2021-09-01 DIAGNOSIS — I5042 Chronic combined systolic (congestive) and diastolic (congestive) heart failure: Secondary | ICD-10-CM | POA: Diagnosis not present

## 2021-09-01 DIAGNOSIS — I513 Intracardiac thrombosis, not elsewhere classified: Secondary | ICD-10-CM | POA: Diagnosis not present

## 2021-09-01 DIAGNOSIS — I4819 Other persistent atrial fibrillation: Secondary | ICD-10-CM | POA: Diagnosis not present

## 2021-09-01 DIAGNOSIS — E854 Organ-limited amyloidosis: Secondary | ICD-10-CM | POA: Diagnosis not present

## 2021-09-01 DIAGNOSIS — Z7901 Long term (current) use of anticoagulants: Secondary | ICD-10-CM

## 2021-09-01 DIAGNOSIS — I25118 Atherosclerotic heart disease of native coronary artery with other forms of angina pectoris: Secondary | ICD-10-CM

## 2021-09-01 DIAGNOSIS — I43 Cardiomyopathy in diseases classified elsewhere: Secondary | ICD-10-CM

## 2021-09-01 NOTE — Patient Instructions (Signed)
Medication Instructions:  Take Torsemide 40 mg twice daily for 3 days, then decrease to once daily.  *If you need a refill on your cardiac medications before your next appointment, please call your pharmacy*   Lab Work: BMET on [place] next week.   If you have labs (blood work) drawn today and your tests are completely normal, you will receive your results only by: [place] (if you have MyChart) OR A paper copy in the mail If you have any lab test that is abnormal or we need to change your treatment, we will call you to review the results.   Follow-Up: At [facility], you and your health needs are our priority.  As part of our continuing mission to provide you with exceptional heart care, we have created designated Provider Care Teams.  These Care Teams include your primary Cardiologist (physician) and Advanced Practice Providers (APPs -  Physician Assistants and Nurse Practitioners) who all work together to provide you with the care you need, when you need it.  We recommend signing up for the patient portal called "MyChart".  Sign up information is provided on this After Visit Summary.  MyChart is used to connect with patients for Virtual Visits (Telemedicine).  Patients are able to view lab/test results, encounter notes, upcoming appointments, etc.  Non-urgent messages can be sent to your provider as well.   To learn more about what you can do with MyChart, go to [email or web address].    Your next appointment:   Keep appointment in February.

## 2021-09-01 NOTE — Progress Notes (Signed)
Cardiology Office Note:   Date:  09/01/2021  NAME:  Regina [name]    MRN: [id number] DOB:  December 22, 1940   PCP:  [name], [name], MD  Cardiologist:  Evalina Field, MD  Electrophysiologist:  None   Referring MD: [name], [name], MD   Chief Complaint  Patient presents with   Follow-up        History of Present Illness:   Regina [name] is a 80 y.o. female with a hx of cardiac amyloidosis, systolic HF with recovery of EF, CVA, Afib with persistent LAA thrombus, CKD IV, non-obstructive CAD, DM who present for follow-up.  She reports the last few days she has noticed increased weight gain.  She also reports increased shortness of breath.  She also reports a sense of fullness in her chest as well as abdomen.  Weight is up to 224 pounds.  She was roughly 218 pounds at her last appointment.  She has been placed on prednisone due to polymyalgia rheumatica.  I suspect this is the etiology of her holding onto fluid.  A1c is also increased as well.  Recent labs her kidney function is stable.  She still has CKD stage IV.  Most recent serum creatinine 1.94.  eGFR 25.  She is not increasing her salt consumption.  She is watching her water intake.  Her blood pressure today in office is 135/84.  She reports no low blood pressure spells.  No dizziness or lightheadedness.  She actually has been active up until several days ago.  She reports going to the golf [place] last week in [facility].  Seem to do well walking around.  No bleeding reported.  Remains on Coumadin.  Most recent INR 2.1.   Problem List 1.  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 Persistent Afib -LAA slugde vs thrombus 05/05/2020  -persistent sludge 07/01/2020 -LAA thrombus persistent 08/05/2020 -on warfarin 3. DM -A1c 7.6 4. HLD -T chol 115, HDL 54, LDL 40, triglycerides 101 5.  HTN 6. Non-obstructive CAD -LHC 01/19/2015 [facility] -50% mid RCA and 50% LAD -MPI 08/14/2020 -> normal 7. OSA 8. CKD 3/4 9. CVA -11/2020 @ [facility]  10. Polymyalgia Rheumatica   Past Medical History: Past Medical History:  Diagnosis Date   Back pain    CHF (congestive heart failure) (HCC)    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 01/2015 Echo: EF 55-60%, Gr 2 DD, mod LVH, mildly dil LA.   Heart disease    Hypertension    Hypothyroidism    Joint pain    Kidney problem    Lower extremity edema    Mini stroke    Non-obstructive CAD    a. 01/2015 Cardiolite: + inf wall ischemia, EF 56%;  b. 01/2015 Cath: LM nl, LAD 66m LCX min irregs, RCA dominant, 50-647m  Sleep apnea    Swallowing difficulty    Thyroid disease     Past Surgical History: Past Surgical History:  Procedure Laterality Date   ABDOMINAL HYSTERECTOMY     BUBBLE STUDY  07/01/2020   Procedure: BUBBLE STUDY;  Surgeon: AcElouise MunroeMD;  Location: [place];  Service: Cardiovascular;;   BUBBLE STUDY  08/05/2020   Procedure: BUBBLE STUDY;  Surgeon: Geralynn Rile, MD;  Location: [place];  Service: Cardiovascular;;  done with definity    CARDIAC CATHETERIZATION     ESOPHAGOGASTRODUODENOSCOPY     a. 01/2015 EGD: patent esophagus.   ESOPHAGOGASTRODUODENOSCOPY N/A 01/20/2015   Procedure: ESOPHAGOGASTRODUODENOSCOPY (EGD);  Surgeon: Carol Ada, MD;  Location: [facility] ENDOSCOPY;  Service: Endoscopy;  Laterality: N/A;   HAND SURGERY     JOINT REPLACEMENT     reports history bilateral TKA and right TSA   LEFT HEART CATHETERIZATION WITH CORONARY ANGIOGRAM N/A 01/19/2015   TEE WITHOUT CARDIOVERSION  05/04/2020   TEE WITHOUT CARDIOVERSION N/A 05/05/2020   Procedure: TRANSESOPHAGEAL ECHOCARDIOGRAM (TEE);   Surgeon: Pixie Casino, MD;  Location: [facility] ENDOSCOPY;  Service: Cardiovascular;  Laterality: N/A;   TEE WITHOUT CARDIOVERSION N/A 07/01/2020   Procedure: TRANSESOPHAGEAL ECHOCARDIOGRAM (TEE);  Surgeon: Elouise Munroe, MD;  Location: [place];  Service: Cardiovascular;  Laterality: N/A;   TEE WITHOUT CARDIOVERSION N/A 08/05/2020   Procedure: TRANSESOPHAGEAL ECHOCARDIOGRAM (TEE);  Surgeon: Geralynn Rile, MD;  Location: [place];  Service: Cardiovascular;  Laterality: N/A;   THYROIDECTOMY      Current Medications: Current Meds  Medication Sig   Alcohol Swabs (B-D SINGLE USE SWABS REGULAR) PADS Use to test blood sugar up to 3 times daily   allopurinol (ZYLOPRIM) 100 MG tablet Take 1 tablet (100 mg total) by mouth daily.   atorvastatin (LIPITOR) 20 MG tablet Take 1 tablet (20 mg total) by mouth daily.   Blood Glucose Monitoring Suppl (ACCU-CHEK GUIDE) w/Device KIT 1 Device by Does not apply route 3 (three) times daily.   Capsaicin 0.033 % CREA Apply 1 application topically 3 (three) times daily.   cetirizine (ZYRTEC) 10 MG tablet Take 10 mg by mouth daily.   Cholecalciferol (VITAMIN D3) 50 MCG (2000 UT) capsule Take 2,000 Units by mouth daily.   Continuous Blood Gluc Sensor (DEXCOM G6 SENSOR) MISC 1 Device by Does not apply route as directed.   Continuous Blood Gluc Transmit (DEXCOM G6 TRANSMITTER) MISC 1 Device by Does not apply route as directed.   diclofenac Sodium (VOLTAREN) 1 % GEL Apply 1 application topically daily as needed (arthritis pain/hands).   famotidine (PEPCID) 20 MG tablet Take 1 tablet (20 mg total) by mouth at bedtime.   FARXIGA 10 MG TABS tablet Take 1 tablet (10 mg total) by mouth daily.   fluticasone (FLONASE) 50 MCG/ACT nasal spray Place 1 spray into both nostrils daily.   gabapentin (NEURONTIN) 600 MG tablet TAKE ONE TABLET BY MOUTH EVERY MORNING and TAKE ONE TABLET BY MOUTH EVERYDAY AT BEDTIME   glucose blood (ACCU-CHEK GUIDE) test strip Use to test  blood sugar up to 3 times daily   insulin glargine, 1 Unit Dial, (TOUJEO SOLOSTAR) 300 UNIT/ML Solostar Pen Inject 26 Units into the skin daily. Eat a snack with protein nightly before bedtime.   insulin lispro (HUMALOG KWIKPEN) 100 UNIT/ML KwikPen Max daily 15 units   Insulin Pen Needle (PEN NEEDLES) 31G X 8 MM MISC Check blood sugar 3-4 times a day as directed   Insulin Pen Needle 32G X 4 MM MISC 1 Device by Does not apply route in the morning, at noon, in the evening, and at bedtime.   ketorolac (ACULAR) 0.4 % SOLN Place 1 drop into the right eye 4 (four) times daily.   Lancets Misc. (ACCU-CHEK FASTCLIX LANCET) KIT Use to test blood  sugar up to 3 times daily   levothyroxine (SYNTHROID) 150 MCG tablet Take 1 tablet (150 mcg total) by mouth daily before breakfast.   magnesium oxide (MAG-OX) 400 (240 Mg) MG tablet Take 1 tablet (400 mg total) by mouth daily.   meclizine (ANTIVERT) 25 MG tablet Take 25 mg by mouth daily as needed for dizziness.    metoprolol succinate (TOPROL-XL) 25 MG 24 hr tablet Take 1 tablet (25 mg total) by mouth daily. Take with or immediately following a meal.   nitroGLYCERIN (NITROSTAT) 0.4 MG SL tablet Place 1 tablet (0.4 mg total) under the tongue every 5 (five) minutes as needed for chest pain.   Polyethylene Glycol 3350 (MIRALAX PO) Take by mouth daily as needed.   prednisoLONE acetate (PRED FORTE) 1 % ophthalmic suspension Place 1 drop into the right eye 4 (four) times daily.   predniSONE (DELTASONE) 1 MG tablet Take 4 tablets (4 mg total) by mouth daily with breakfast.   predniSONE (DELTASONE) 5 MG tablet 2 tabs qam x 2weeks, then 1 tab qam   Propylene Glycol (SYSTANE BALANCE OP) Place 1 drop into both eyes daily as needed (for dry eyes).   Tafamidis 61 MG CAPS Take 61 mg by mouth daily.   tiZANidine (ZANAFLEX) 4 MG tablet Take 0.5-1 tablets (2-4 mg total) by mouth at bedtime as needed for muscle spasms.   tobramycin (TOBREX) 0.3 % ophthalmic solution Place 1 drop  into the right eye 4 (four) times daily.   torsemide (DEMADEX) 20 MG tablet Take 2 tablets (40 mg total) by mouth daily. Ok to take extra dose if wt gain of 1 lb in 1 day or 5 lbs in 7 days   trolamine salicylate (ASPERCREME) 10 % cream Apply 1 application topically as needed for muscle pain.   warfarin (COUMADIN) 5 MG tablet Take 1-3 tablets by MOUTH daily OR as directed by THE coumadin clinic     Allergies:    Ace inhibitors, Amlodipine, Atenolol, Avandia [rosiglitazone], Darvon [propoxyphene], Erythromycin, Hydralazine, Hydrocodone, Levofloxacin, Morphine and related, Percocet [oxycodone-acetaminophen], Spironolactone, and Tramadol   Social History: Social History   Socioeconomic History   Marital status: Widowed    Spouse name: Not on file   Number of children: Not on file   Years of education: Not on file   Highest education level: Not on file  Occupational History   Occupation: Retired  Tobacco Use   Smoking status: Never   Smokeless tobacco: Never  Vaping Use   Vaping Use: Never used  Substance and Sexual Activity   Alcohol use: No   Drug use: Not on file   Sexual activity: Not Currently  Other Topics Concern   Not on file  Social History Narrative   Lives with daughter   Social Determinants of Health   Financial Resource Strain: Low Risk    Difficulty of Paying Living Expenses: Not hard at all  Food Insecurity: No Food Insecurity   Worried About Charity fundraiser in the Last Year: Never true   Arboriculturist in the Last Year: Never true  Transportation Needs: No Transportation Needs   Lack of Transportation (Medical): No   Lack of Transportation (Non-Medical): No  Physical Activity: Inactive   Days of Exercise per Week: 0 days   Minutes of Exercise per Session: 0 min  Stress: No Stress Concern Present   Feeling of Stress : Not at all  Social Connections: Moderately Isolated   Frequency of Communication with Friends and Family:  More than three times a week    Frequency of Social Gatherings with Friends and Family: Twice a week   Attends Religious Services: More than 4 times per year   Active Member of [employer] or Organizations: No   Attends Archivist Meetings: Never   Marital Status: Widowed     Family History: The patient's family history includes Alcoholism in her father; Cancer in her father; Heart disease in her mother; Hypertension in her mother.  ROS:   All other ROS reviewed and negative. Pertinent positives noted in the HPI.     EKGs/Labs/Other Studies Reviewed:   The following studies were personally reviewed by me today:  NM Stress 08/14/2020 Lexiscan stress is electrically negative for ischemia Myovue scan shows normal perfusion. No signficant ischemia or scar. LVEF is calculated at 29% with diffuse hypokinesis LV is dilated. Compared to Myovue in 2016, perfusion is normal but LVEF is now significantly decreased  TTE 04/21/2021  1. Left ventricular ejection fraction, by estimation, is 45 to 50%. The  left ventricle has mildly decreased function. The left ventricle  demonstrates global hypokinesis. There is moderate concentric left  ventricular hypertrophy. Left ventricular  diastolic function could not be evaluated.   2. Right ventricular systolic function is moderately reduced. The right  ventricular size is normal. Tricuspid regurgitation signal is inadequate  for assessing PA pressure.   3. Left atrial size was moderately dilated.   4. The mitral valve is grossly normal. Mild mitral valve regurgitation.  No evidence of mitral stenosis.   5. The aortic valve is tricuspid. Aortic valve regurgitation is not  visualized. No aortic stenosis is present.   6. The inferior vena cava is normal in size with greater than 50%  respiratory variability, suggesting right atrial pressure of 3 mmHg.   Recent Labs: 02/15/2021: Magnesium 2.2 04/02/2021: Hemoglobin 12.0; Platelets 249 06/09/2021: TSH 0.65 08/13/2021: BUN 59;  Creatinine, Ser 1.94; Potassium 3.5; Sodium 137   Recent Lipid Panel    Component Value Date/Time   CHOL 115 08/13/2021 1501   CHOL 117 03/24/2020 1428   TRIG 101.0 08/13/2021 1501   HDL 54.80 08/13/2021 1501   HDL 56 03/24/2020 1428   CHOLHDL 2 08/13/2021 1501   VLDL 20.2 08/13/2021 1501   LDLCALC 40 08/13/2021 1501   LDLCALC 49 03/24/2020 1428    Physical Exam:   VS:  BP 135/84   Pulse 94   Ht _0  (1.575 m)   Wt 224 lb 9.6 oz (101.9 kg)   SpO2 99%   BMI 41.08 kg/m    Wt Readings from Last 3 Encounters:  09/01/21 224 lb 9.6 oz (101.9 kg)  08/13/21 221 lb (100.2 kg)  08/11/21 220 lb (99.8 kg)    General: Well nourished, well developed, in no acute distress Head: Atraumatic, normal size  Eyes: PEERLA, EOMI  Neck: Supple, no JVD Endocrine: No thryomegaly Cardiac: Normal S1, S2; irregular rhythm, no murmurs Lungs: Clear to auscultation bilaterally, no wheezing, rhonchi or rales  Abd: Soft, nontender, no hepatomegaly  Ext: 1+ lower extremity edema Musculoskeletal: No deformities, BUE and BLE strength normal and equal Skin: Warm and dry, no rashes   Neuro: Alert and oriented to person, place, time, and situation, CNII-XII grossly intact, no focal deficits  Psych: Normal mood and affect   ASSESSMENT:   KIEARRA OYERVIDES is a 80 y.o. female who presents for the following: 1. Hereditary cardiac amyloidosis ([place])   2. Chronic combined systolic and diastolic CHF (congestive heart  failure) ([place])   3. Persistent atrial fibrillation (HCC)   4. Persistent left atrial appendage thrombus   5. Long term (current) use of anticoagulants   6. Coronary artery disease involving native coronary artery of native heart with other form of angina pectoris ([place])     PLAN:   1. Hereditary cardiac amyloidosis ([place]) 2. Chronic combined systolic and diastolic CHF (congestive heart failure) ([place]) -She has known hereditary cardiac amyloidosis.  Ejection fraction was reduced which I think was  related to propofol in the past.  She has recovered her EF up to 45 to 50%.  She presents with increased weight gain of roughly 6 pounds.  She has been placed on prednisone for polymyalgia rheumatica.  I suspect this is the etiology of her weight gain.  She does appear a bit volume up.  She has CKD stage IV which is stable.  She does not appear to be hypotensive or developing worsening systolic dysfunction.  Overall I think we can just increase her torsemide to 40 mg twice daily for 3 days.  I asked her to watch her salt intake.  She is slowly titrating her prednisone down.  Hopefully her ability to hold onto fluid will lessen as her prednisone dose decreases.  She will come back on Monday or Tuesday for repeat BMP to make sure kidney function is stable. -For now she will continue on metoprolol succinate 25 mg daily.  No ACE/ARB/Arni/MRA given CKD stage IV.  She is on Farxiga 10 mg daily and followed closely by nephrology. -She is also stable on tafamidis 61 mg daily.  She will continue this medication.  I believe this has really kept her out of the hospital.  3. Persistent atrial fibrillation ([place]) 4. Persistent left atrial appendage thrombus 5. Long term (current) use of anticoagulants -Known history of atrial fibrillation.  She is undergone attempted TEE/cardioversion 3 times.  She has been found to have left atrial sludge/thrombus multiple times.  We have decided on Coumadin therapy.  She failed Eliquis as she had persistent thrombus on this.  She is actually done remarkably well despite being in A. fib and a diagnosis of hereditary cardiac amyloidosis.  We have decided just to continue with a rate control strategy.  For now we will continue 25 mg metoprolol tartrate.  Continue Coumadin with INR goal 2-3.  I see no need for attempt at restoration of sinus rhythm given her stability and persistent left atrial appendage thrombus.  Again she has attempted this procedure 3 times.  She has been quite stable  since then.  6. Coronary artery disease involving native coronary artery of native heart with other form of angina pectoris (HCC) -Known history of nonobstructive CAD.  Most recent stress test shows no evidence of ischemia or infarction.  Her symptoms of abdominal fullness as well as chest fullness have always been related to volume overload.  We will continue with the above diuresis and her symptoms always do improve.  We will keep a close eye on her moving forward.  Her most recent LDL cholesterol is at goal.  She will continue Lipitor 20 mg daily.  Disposition: Return if symptoms worsen or fail to improve.  Medication Adjustments/Labs and Tests Ordered: Current medicines are reviewed at length with the patient today.  Concerns regarding medicines are outlined above.  Orders Placed This Encounter  Procedures   Basic metabolic panel    No orders of the defined types were placed in this encounter.   Patient Instructions  Medication Instructions:  Take Torsemide 40 mg twice daily for 3 days, then decrease to once daily.  *If you need a refill on your cardiac medications before your next appointment, please call your pharmacy*   Lab Work: BMET on [place] next week.   If you have labs (blood work) drawn today and your tests are completely normal, you will receive your results only by: [place] (if you have MyChart) OR A paper copy in the mail If you have any lab test that is abnormal or we need to change your treatment, we will call you to review the results.   Follow-Up: At [facility], you and your health needs are our priority.  As part of our continuing mission to provide you with exceptional heart care, we have created designated Provider Care Teams.  These Care Teams include your primary Cardiologist (physician) and Advanced Practice Providers (APPs -  Physician Assistants and Nurse Practitioners) who all work together to provide you with the care you need, when  you need it.  We recommend signing up for the patient portal called "MyChart".  Sign up information is provided on this After Visit Summary.  MyChart is used to connect with patients for Virtual Visits (Telemedicine).  Patients are able to view lab/test results, encounter notes, upcoming appointments, etc.  Non-urgent messages can be sent to your provider as well.   To learn more about what you can do with MyChart, go to [email or web address].    Your next appointment:   Keep appointment in February.     Time Spent with Patient: I have spent a total of 35 minutes with patient reviewing hospital notes, telemetry, EKGs, labs and examining the patient as well as establishing an assessment and plan that was discussed with the patient.  > 50% of time was spent in direct patient care.  Signed, Addison Naegeli. Audie Box, MD, [place]  [address], [employer], [place] [address] [phone]  09/01/2021 5:12 PM

## 2021-09-03 DIAGNOSIS — G4733 Obstructive sleep apnea (adult) (pediatric): Secondary | ICD-10-CM | POA: Diagnosis not present

## 2021-09-06 DIAGNOSIS — I5042 Chronic combined systolic (congestive) and diastolic (congestive) heart failure: Secondary | ICD-10-CM | POA: Diagnosis not present

## 2021-09-07 LAB — BASIC METABOLIC PANEL
BUN/Creatinine Ratio: 25 (ref 12–28)
BUN: 48 mg/dL — ABNORMAL HIGH (ref 8–27)
CO2: 29 mmol/L (ref 20–29)
Calcium: 9.1 mg/dL (ref 8.7–10.3)
Chloride: 94 mmol/L — ABNORMAL LOW (ref 96–106)
Creatinine, Ser: 1.94 mg/dL — ABNORMAL HIGH (ref 0.57–1.00)
Glucose: 162 mg/dL — ABNORMAL HIGH (ref 70–99)
Potassium: 3.7 mmol/L (ref 3.5–5.2)
Sodium: 140 mmol/L (ref 134–144)
eGFR: 26 mL/min/{1.73_m2} — ABNORMAL LOW (ref >59)

## 2021-09-13 ENCOUNTER — Telehealth: Payer: Self-pay

## 2021-09-13 ENCOUNTER — Other Ambulatory Visit: Payer: Self-pay | Admitting: Cardiovascular Disease

## 2021-09-13 NOTE — Telephone Encounter (Signed)
Patient called asking if an Rx can be sent to the pharmacy patient stated she is having head congestion, stopped up nose

## 2021-09-13 NOTE — Telephone Encounter (Signed)
Needs appt

## 2021-09-14 ENCOUNTER — Encounter: Payer: Self-pay | Admitting: Family Medicine

## 2021-09-14 ENCOUNTER — Telehealth: Payer: Self-pay | Admitting: Pharmacist

## 2021-09-14 ENCOUNTER — Telehealth (INDEPENDENT_AMBULATORY_CARE_PROVIDER_SITE_OTHER): Payer: Medicare Other | Admitting: Family Medicine

## 2021-09-14 VITALS — Ht 62.0 in

## 2021-09-14 DIAGNOSIS — J302 Other seasonal allergic rhinitis: Secondary | ICD-10-CM

## 2021-09-14 MED ORDER — IPRATROPIUM BROMIDE 0.06 % NA SOLN: 2.0000 | Freq: Four times a day (QID) | NASAL | 3 refills | Status: DC

## 2021-09-14 NOTE — Progress Notes (Signed)
Virtual Visit via Video Note I connected with Regina [name] on 09/14/21 by a video enabled telemedicine application and verified that I am speaking with the correct person using two identifiers.  Location patient: home Location provider:work office Persons participating in the virtual visit: patient, provider  I discussed the limitations of evaluation and management by telemedicine and the availability of in person appointments. The patient expressed understanding and agreed to proceed.  Chief Complaint  Patient presents with   head congestion    Since Thanksgiving, cough, no sore throat, head congestion. No fever. No aches and pain, negative covid test.    HPI: [name] is a 80 yo female with hx of CKD 3, DM 2, GERD, OSA, and atrial fibrillation on chronic anticoagulation c/o 5 days of respiratory symptoms. Nonproductive cough mainly, occasionally she has some clear sputum, nasal congestion, rhinorrhea, and postnasal drainage. Negative for fever, chills, body aches, sore throat, CP, wheezing, dyspnea, abdominal pain, nausea, vomiting, urinary symptoms, changes in bowel habits, or skin rash. She has not tried OTC medications.  Negative for sick contacts or recent travel. Allergy rhinitis, currently she is on cetirizine 10 mg daily and Flonase nasal spray daily.  ROS: See pertinent positives and negatives per HPI.  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 01/2015 Echo: EF 55-60%, Gr 2 DD, mod LVH, mildly dil LA.   Heart disease    Hypertension    Hypothyroidism    Joint pain    Kidney problem    Lower extremity edema    Mini stroke    Non-obstructive CAD    a.  01/2015 Cardiolite: + inf wall ischemia, EF 56%;  b. 01/2015 Cath: LM nl, LAD 75m LCX min irregs, RCA dominant, 50-631m  Sleep apnea    Swallowing difficulty    Thyroid disease    Past Surgical History:  Procedure Laterality Date   ABDOMINAL HYSTERECTOMY     BUBBLE STUDY  07/01/2020   Procedure: BUBBLE STUDY;  Surgeon: AcElouise MunroeMD;  [facility];;   BUBBLE STUDY  08/05/2020   Procedure: BUBBLE STUDY;  Surgeon: O'Geralynn RileMD;  [facility];;  done with definity    CARDIAC CATHETERIZATION     ESOPHAGOGASTRODUODENOSCOPY     a. 01/2015 EGD: patent esophagus.   ESOPHAGOGASTRODUODENOSCOPY N/A 01/20/2015   Procedure: ESOPHAGOGASTRODUODENOSCOPY (EGD);  Surgeon: PaCarol AdaMD;  [facility];  Service: Endoscopy;  Laterality: N/A;   HAND SURGERY     JOINT REPLACEMENT     reports history bilateral TKA and right TSA   LEFT HEART CATHETERIZATION WITH CORONARY ANGIOGRAM N/A 01/19/2015   TEE WITHOUT CARDIOVERSION  05/04/2020   TEE WITHOUT CARDIOVERSION N/A 05/05/2020   Procedure: TRANSESOPHAGEAL ECHOCARDIOGRAM (TEE);  Surgeon: HiPixie CasinoMD;  [facility];  Service: Cardiovascular;  Laterality: N/A;   TEE WITHOUT CARDIOVERSION N/A 07/01/2020   Procedure: TRANSESOPHAGEAL ECHOCARDIOGRAM (TEE);  Surgeon: AcElouise MunroeMD;  [facility];  Laterality: N/A;   TEE WITHOUT CARDIOVERSION N/A 08/05/2020   Procedure: TRANSESOPHAGEAL ECHOCARDIOGRAM (TEE);  Surgeon: O'Geralynn RileMD;  [facility];  Service: Cardiovascular;  Laterality: N/A;   THYROIDECTOMY      Family History  Problem Relation Age of Onset   Heart disease Mother    Hypertension Mother    Cancer Father    Alcoholism Father     Social History   Socioeconomic History   Marital status: Widowed    Spouse name: Not on file   Number of children: Not on file    Years of education: Not on file   Highest education level: Not on file  Occupational History   Occupation: Retired  Tobacco Use   Smoking status: Never   Smokeless tobacco: Never  Vaping Use   Vaping Use: Never used  Substance and Sexual Activity   Alcohol use: No   Drug use: Not on file   Sexual activity: Not Currently  Other Topics Concern   Not on file  Social History Narrative   Lives with daughter   Social Determinants of Health   Financial Resource Strain: Low Risk    Difficulty of Paying Living Expenses: Not hard at all  Food Insecurity: No Food Insecurity   Worried About Charity fundraiser in the Last Year: Never true   Arboriculturist in the Last Year: Never true  Transportation Needs: No Transportation Needs   Lack of Transportation (Medical): No   Lack of Transportation (Non-Medical): No  Physical Activity: Inactive   Days of Exercise per Week: 0 days   Minutes of Exercise per Session: 0 min  Stress: No Stress Concern Present   Feeling of Stress : Not at all  Social Connections: Moderately Isolated   Frequency of Communication with Friends and Family: More than three times a week   Frequency of Social Gatherings with Friends and Family: Twice a week   Attends Religious Services: More than 4 times per year   Active Member of [employer] or Organizations: No   Attends Archivist Meetings: Never   Marital Status: Widowed  Human resources officer Violence: Not At Risk   Fear of Current or Ex-Partner: No   Emotionally Abused: No   Physically Abused: No   Sexually Abused: No      Current Outpatient Medications:    Alcohol Swabs (B-D SINGLE USE SWABS REGULAR) PADS, Use to test blood sugar up to 3 times daily, Disp: 100 each, Rfl: 3   allopurinol (ZYLOPRIM) 100 MG tablet, Take 1 tablet (100 mg total) by mouth daily., Disp: 90 tablet, Rfl: 1   atorvastatin (LIPITOR) 20 MG tablet, Take 1 tablet (20 mg total) by mouth daily., Disp: 90 tablet, Rfl: 1   Blood Glucose  Monitoring Suppl (ACCU-CHEK GUIDE) w/Device KIT, 1 Device by Does not apply route 3 (three) times daily., Disp: 1 kit, Rfl: 0   Capsaicin 0.033 % CREA, Apply 1 application topically 3 (three) times daily., Disp: 56.6 g, Rfl: 11   cetirizine (ZYRTEC) 10 MG tablet, Take 10 mg by mouth daily., Disp: , Rfl:    Cholecalciferol (VITAMIN D3) 50 MCG (2000 UT) capsule, Take 2,000 Units by mouth daily., Disp: , Rfl:    Continuous Blood Gluc Sensor (DEXCOM G6 SENSOR) MISC, 1 Device by Does not apply route as directed., Disp: 9 each, Rfl: 3   Continuous Blood Gluc Transmit (DEXCOM G6 TRANSMITTER) MISC, 1 Device by Does not apply route as directed., Disp: 1 each, Rfl: 3   diclofenac Sodium (VOLTAREN) 1 % GEL, Apply 1 application topically daily as needed (arthritis pain/hands)., Disp: , Rfl:  famotidine (PEPCID) 20 MG tablet, Take 1 tablet (20 mg total) by mouth at bedtime., Disp: 90 tablet, Rfl: 1   FARXIGA 10 MG TABS tablet, Take 1 tablet (10 mg total) by mouth daily., Disp: 90 tablet, Rfl: 3   fluticasone (FLONASE) 50 MCG/ACT nasal spray, Place 1 spray into both nostrils daily., Disp: , Rfl:    gabapentin (NEURONTIN) 600 MG tablet, TAKE ONE TABLET BY MOUTH EVERY MORNING and TAKE ONE TABLET BY MOUTH EVERYDAY AT BEDTIME, Disp: 60 tablet, Rfl: 3   glucose blood (ACCU-CHEK GUIDE) test strip, Use to test blood sugar up to 3 times daily, Disp: 100 each, Rfl: 3   insulin glargine, 1 Unit Dial, (TOUJEO SOLOSTAR) 300 UNIT/ML Solostar Pen, Inject 26 Units into the skin daily. Eat a snack with protein nightly before bedtime., Disp: 15 mL, Rfl: 6   insulin lispro (HUMALOG KWIKPEN) 100 UNIT/ML KwikPen, Max daily 15 units, Disp: 15 mL, Rfl: 11   Insulin Pen Needle (PEN NEEDLES) 31G X 8 MM MISC, Check blood sugar 3-4 times a day as directed, Disp: 100 each, Rfl: 2   Insulin Pen Needle 32G X 4 MM MISC, 1 Device by Does not apply route in the morning, at noon, in the evening, and at bedtime., Disp: 150 each, Rfl: 6    ketorolac (ACULAR) 0.4 % SOLN, Place 1 drop into the right eye 4 (four) times daily., Disp: , Rfl:    Lancets Misc. (ACCU-CHEK FASTCLIX LANCET) KIT, Use to test blood sugar up to 3 times daily, Disp: 1 kit, Rfl: 1   levothyroxine (SYNTHROID) 150 MCG tablet, Take 1 tablet (150 mcg total) by mouth daily before breakfast., Disp: 90 tablet, Rfl: 1   magnesium oxide (MAG-OX) 400 (240 Mg) MG tablet, Take 1 tablet (400 mg total) by mouth daily., Disp: 90 tablet, Rfl: 1   meclizine (ANTIVERT) 25 MG tablet, Take 25 mg by mouth daily as needed for dizziness. , Disp: , Rfl:    metoprolol succinate (TOPROL-XL) 25 MG 24 hr tablet, Take 1 tablet (25 mg total) by mouth daily. Take with or immediately following a meal., Disp: 90 tablet, Rfl: 3   nitroGLYCERIN (NITROSTAT) 0.4 MG SL tablet, Place 1 tablet (0.4 mg total) under the tongue every 5 (five) minutes as needed for chest pain., Disp: 25 tablet, Rfl: 2   Polyethylene Glycol 3350 (MIRALAX PO), Take by mouth daily as needed., Disp: , Rfl:    prednisoLONE acetate (PRED FORTE) 1 % ophthalmic suspension, Place 1 drop into the right eye 4 (four) times daily., Disp: , Rfl:    predniSONE (DELTASONE) 1 MG tablet, Take 4 tablets (4 mg total) by mouth daily with breakfast., Disp: 120 tablet, Rfl: 1   predniSONE (DELTASONE) 5 MG tablet, 2 tabs qam x 2weeks, then 1 tab qam, Disp: 60 tablet, Rfl: 3   Propylene Glycol (SYSTANE BALANCE OP), Place 1 drop into both eyes daily as needed (for dry eyes)., Disp: , Rfl:    Tafamidis 61 MG CAPS, Take 61 mg by mouth daily., Disp: 30 capsule, Rfl: 12   tiZANidine (ZANAFLEX) 4 MG tablet, Take 0.5-1 tablets (2-4 mg total) by mouth at bedtime as needed for muscle spasms., Disp: 30 tablet, Rfl: 0   tobramycin (TOBREX) 0.3 % ophthalmic solution, Place 1 drop into the right eye 4 (four) times daily., Disp: , Rfl:    trolamine salicylate (ASPERCREME) 10 % cream, Apply 1 application topically as needed for muscle pain., Disp: , Rfl:    warfarin  (COUMADIN)  5 MG tablet, take 1-3 tablets daily OR AS DIRECTED by THE coumadin clinic, Disp: 65 tablet, Rfl: 0   DULoxetine (CYMBALTA) 60 MG capsule, Take 1 capsule (60 mg total) by mouth daily. (Patient not taking: Reported on 09/01/2021), Disp: 30 capsule, Rfl: 6   torsemide (DEMADEX) 20 MG tablet, Take 2 tablets (40 mg total) by mouth daily. Ok to take extra dose if wt gain of 1 lb in 1 day or 5 lbs in 7 days, Disp: 180 tablet, Rfl: 3  EXAM:  VITALS per patient if applicable:Ht 5' 2"  (1.575 m)   BMI 41.08 kg/m   GENERAL: alert, oriented, appears well and in no acute distress  HEENT: atraumatic, conjunctiva clear, no obvious abnormalities on inspection of external nose and ears Nasal congestion and rhinorrhea.  LUNGS: on inspection no signs of respiratory distress, breathing rate appears normal, no obvious gross SOB, gasping or wheezing  CV: no obvious cyanosis  MS: moves all visible extremities without noticeable abnormality  PSYCH/NEURO: pleasant and cooperative, no obvious depression or anxiety, speech and thought processing grossly intact  ASSESSMENT AND PLAN:  Discussed the following assessment and plan:  Seasonal allergic rhinitis, unspecified trigger - Plan: ipratropium (ATROVENT) 0.06 % nasal spray We discussed possible etiologies. History does not suggest an infectious process. Recommend symptomatic treatment, nasal saline irrigations as needed. Flonase does not seem to be helping, so discontinued. Recommend trying Atrovent nasal spray 3-4 times per day as needed. Stop cetirizine and try a different antihistaminic, Allegra or Claritin. Monitor for new symptoms.   We discussed possible serious and likely etiologies, options for evaluation and workup, limitations of telemedicine visit vs in person visit, treatment, treatment risks and precautions.  I discussed the assessment and treatment plan with the patient. The patient was provided an opportunity to ask questions and  all were answered. The patient agreed with the plan and demonstrated an understanding of the instructions.  Return if symptoms worsen or fail to improve.  Renesmay Nesbitt G. [name], MD  [facility]. [place] office.

## 2021-09-14 NOTE — Chronic Care Management (AMB) (Signed)
Chronic Care Management Pharmacy Assistant   Name: Regina [name]  MRN: [id number] DOB: [birth year]  Reason for Encounter: Medication Review / Medication Coordination Call   Conditions to be addressed/monitored: HTN   Recent office visits:  None  Recent consult visits:  09/01/2021 Geralynn Rile MD (cardiology) - Patient was seen for Hereditary Cardiac Amyloidosis and additional issues. Take Torsemide 40 mg twice daily for 3 days, then decrease to once daily. Follow up if symptoms worsen or fail to improve.  08/13/2021 Cloyd Stagers MD (endocrinology) - Patient was seen for Type 2 diabetes mellitus and additional issues. No medication changes. No follow up noted  Hospital visits:  None Medications: Outpatient Encounter Medications as of 09/14/2021  Medication Sig Note   Alcohol Swabs (B-D SINGLE USE SWABS REGULAR) PADS Use to test blood sugar up to 3 times daily    allopurinol (ZYLOPRIM) 100 MG tablet Take 1 tablet (100 mg total) by mouth daily.    atorvastatin (LIPITOR) 20 MG tablet Take 1 tablet (20 mg total) by mouth daily.    Blood Glucose Monitoring Suppl (ACCU-CHEK GUIDE) w/Device KIT 1 Device by Does not apply route 3 (three) times daily.    Capsaicin 0.033 % CREA Apply 1 application topically 3 (three) times daily.    cetirizine (ZYRTEC) 10 MG tablet Take 10 mg by mouth daily.    Cholecalciferol (VITAMIN D3) 50 MCG (2000 UT) capsule Take 2,000 Units by mouth daily.    Continuous Blood Gluc Sensor (DEXCOM G6 SENSOR) MISC 1 Device by Does not apply route as directed.    Continuous Blood Gluc Transmit (DEXCOM G6 TRANSMITTER) MISC 1 Device by Does not apply route as directed.    diclofenac Sodium (VOLTAREN) 1 % GEL Apply 1 application topically daily as needed (arthritis pain/hands).    DULoxetine (CYMBALTA) 60 MG capsule Take 1 capsule (60 mg total) by mouth daily. (Patient not taking: Reported on 09/01/2021)    famotidine (PEPCID) 20 MG tablet Take 1  tablet (20 mg total) by mouth at bedtime.    FARXIGA 10 MG TABS tablet Take 1 tablet (10 mg total) by mouth daily.    fluticasone (FLONASE) 50 MCG/ACT nasal spray Place 1 spray into both nostrils daily.    gabapentin (NEURONTIN) 600 MG tablet TAKE ONE TABLET BY MOUTH EVERY MORNING and TAKE ONE TABLET BY MOUTH EVERYDAY AT BEDTIME    glucose blood (ACCU-CHEK GUIDE) test strip Use to test blood sugar up to 3 times daily    insulin glargine, 1 Unit Dial, (TOUJEO SOLOSTAR) 300 UNIT/ML Solostar Pen Inject 26 Units into the skin daily. Eat a snack with protein nightly before bedtime.    insulin lispro (HUMALOG KWIKPEN) 100 UNIT/ML KwikPen Max daily 15 units    Insulin Pen Needle (PEN NEEDLES) 31G X 8 MM MISC Check blood sugar 3-4 times a day as directed    Insulin Pen Needle 32G X 4 MM MISC 1 Device by Does not apply route in the morning, at noon, in the evening, and at bedtime.    ketorolac (ACULAR) 0.4 % SOLN Place 1 drop into the right eye 4 (four) times daily.    Lancets Misc. (ACCU-CHEK FASTCLIX LANCET) KIT Use to test blood sugar up to 3 times daily    levothyroxine (SYNTHROID) 150 MCG tablet Take 1 tablet (150 mcg total) by mouth daily before breakfast.    magnesium oxide (MAG-OX) 400 (240 Mg) MG tablet Take 1 tablet (400 mg total) by mouth daily.  meclizine (ANTIVERT) 25 MG tablet Take 25 mg by mouth daily as needed for dizziness.  01/08/2021: prn   metoprolol succinate (TOPROL-XL) 25 MG 24 hr tablet Take 1 tablet (25 mg total) by mouth daily. Take with or immediately following a meal.    nitroGLYCERIN (NITROSTAT) 0.4 MG SL tablet Place 1 tablet (0.4 mg total) under the tongue every 5 (five) minutes as needed for chest pain.    Polyethylene Glycol 3350 (MIRALAX PO) Take by mouth daily as needed.    prednisoLONE acetate (PRED FORTE) 1 % ophthalmic suspension Place 1 drop into the right eye 4 (four) times daily.    predniSONE (DELTASONE) 1 MG tablet Take 4 tablets (4 mg total) by mouth daily with  breakfast.    predniSONE (DELTASONE) 5 MG tablet 2 tabs qam x 2weeks, then 1 tab qam    Propylene Glycol (SYSTANE BALANCE OP) Place 1 drop into both eyes daily as needed (for dry eyes).    Tafamidis 61 MG CAPS Take 61 mg by mouth daily.    tiZANidine (ZANAFLEX) 4 MG tablet Take 0.5-1 tablets (2-4 mg total) by mouth at bedtime as needed for muscle spasms.    tobramycin (TOBREX) 0.3 % ophthalmic solution Place 1 drop into the right eye 4 (four) times daily.    torsemide (DEMADEX) 20 MG tablet Take 2 tablets (40 mg total) by mouth daily. Ok to take extra dose if wt gain of 1 lb in 1 day or 5 lbs in 7 days    trolamine salicylate (ASPERCREME) 10 % cream Apply 1 application topically as needed for muscle pain.    warfarin (COUMADIN) 5 MG tablet take 1-3 tablets daily OR AS DIRECTED by THE coumadin clinic    No facility-administered encounter medications on file as of 09/14/2021.   Reviewed chart for medication changes ahead of medication coordination call.  No OVs, Consults, or hospital visits since last care coordination call/Pharmacist visit. (If appropriate, list visit date, provider name)  No medication changes indicated OR if recent visit, treatment plan here.  BP Readings from Last 3 Encounters:  09/01/21 135/84  08/13/21 120/78  08/11/21 95/63    Lab Results  Component Value Date   HGBA1C 7.6 (A) 08/13/2021     Patient obtains medications through Adherence Packaging  30 Days   Last adherence delivery included: Toujeo solostar U-300 injection  Pen needles 8 mm x 31 g x 5/16 (patient prefers these for both Toujeo and Humalog) Gabapentin 600 mg tablet  1 tablet at breakfast and 1 tablet at dinner Metoprolol succinate 25 mg tablets 1 tablet at breakfast Allopurinol 100 mg 1 tablet at breakfast Atorvastatin 20 mg 1 tablet at bedtime Cetirizine 10 mg 1 tablet at breakfast Levothyroxine 150 mcg 1 tablet before breakfast Farxiga 10 mg 1 tablet at breakfast Magnesium oxide 400 mg 1  tablet at breakfast Warfarin 5 mg tablet (easy top vials) Torsemide 20 mg 2 tablets at breakfast and as needed (extra in an easy top vial) ACCU CHEK test strips - use to check blood sugar three times daily.  Patient declined (meds) last month: Vitamin C (no longer taking) Vitamin D (gets cheaper through [facility])  duloxetine $RemoveBef'60mg'AbbeiIpAhy$  - take 1 capsule by mouth daily.  Patient is due for next adherence delivery on: 09/24/2021.  Called patient and reviewed medications and coordinated delivery.  This delivery to include:  Pen needles 8 mm x 31 g x 5/16 (patient prefers these for both Toujeo and Humalog) Gabapentin 600 mg tablet  1  tablet at breakfast and 1 tablet at dinner Metoprolol succinate 25 mg tablets 1 tablet at breakfast Allopurinol 100 mg 1 tablet at breakfast Atorvastatin 20 mg 1 tablet at bedtime Cetirizine 10 mg 1 tablet at breakfast Levothyroxine 150 mcg 1 tablet before breakfast Farxiga 10 mg 1 tablet at breakfast Magnesium oxide 400 mg 1 tablet at breakfast Warfarin 5 mg tablet (easy top vials) Torsemide 20 mg 2 tablets at breakfast and as needed (extra in an easy top vial) ACCU CHEK test strips - use to check blood sugar three times daily.   Patient will need a short fill:None  Coordinated acute fill:None  Patient declined the following medications: Humalog, Toujeo and Nitroglycerin states she has plenty on hand.  Duloxetine she is not taking and never has taken this.  Patient needs refills for: None  Confirmed delivery date of 09/24/2021, advised patient that pharmacy will contact them the morning of delivery.   Care Gaps: AWV - scheduled for 02/16/22 Zoster vaccines - due Urine microalbumin - overdue  Foot exam - overdue Covid-19 vaccine - overdue  Flu vaccine - due  Star Rating Drugs: Atorvastatin 24m - last filled on [facility]18/2021 [facility]- last filled on 08/18/2021 [facility]DS [id number] [phone]

## 2021-09-16 ENCOUNTER — Telehealth: Payer: Self-pay

## 2021-09-16 NOTE — Telephone Encounter (Signed)
I spoke to the patient who was wondering if she needed to Hold Coumadin for Cataract surgery.  I informed her that we usually do not for that procedure.

## 2021-09-28 ENCOUNTER — Ambulatory Visit: Payer: Medicare Other | Admitting: Neurology

## 2021-09-30 ENCOUNTER — Ambulatory Visit (INDEPENDENT_AMBULATORY_CARE_PROVIDER_SITE_OTHER): Payer: Medicare Other

## 2021-09-30 ENCOUNTER — Other Ambulatory Visit: Payer: Self-pay

## 2021-09-30 DIAGNOSIS — I4819 Other persistent atrial fibrillation: Secondary | ICD-10-CM

## 2021-09-30 DIAGNOSIS — Z7901 Long term (current) use of anticoagulants: Secondary | ICD-10-CM | POA: Diagnosis not present

## 2021-09-30 LAB — POCT INR: INR: 2.8 (ref 2.0–3.0)

## 2021-09-30 NOTE — Patient Instructions (Signed)
Continue taking 1/2 tablet daily except 1 tablet each Sunday.  Repeat INR in 5 weeks; [phone];

## 2021-10-04 NOTE — Progress Notes (Deleted)
ACUTE VISIT No chief complaint on file.  HPI: [name] is a 80 y.o. female, who is here today complaining of *** HPI  Review of Systems Rest see pertinent positives and negatives per HPI.  Current Outpatient Medications on File Prior to Visit  Medication Sig Dispense Refill   Alcohol Swabs (B-D SINGLE USE SWABS REGULAR) PADS Use to test blood sugar up to 3 times daily 100 each 3   allopurinol (ZYLOPRIM) 100 MG tablet Take 1 tablet (100 mg total) by mouth daily. 90 tablet 1   atorvastatin (LIPITOR) 20 MG tablet Take 1 tablet (20 mg total) by mouth daily. 90 tablet 1   Blood Glucose Monitoring Suppl (ACCU-CHEK GUIDE) w/Device KIT 1 Device by Does not apply route 3 (three) times daily. 1 kit 0   Capsaicin 0.033 % CREA Apply 1 application topically 3 (three) times daily. 56.6 g 11   Cholecalciferol (VITAMIN D3) 50 MCG (2000 UT) capsule Take 2,000 Units by mouth daily.     Continuous Blood Gluc Sensor (DEXCOM G6 SENSOR) MISC 1 Device by Does not apply route as directed. 9 each 3   Continuous Blood Gluc Transmit (DEXCOM G6 TRANSMITTER) MISC 1 Device by Does not apply route as directed. 1 each 3   diclofenac Sodium (VOLTAREN) 1 % GEL Apply 1 application topically daily as needed (arthritis pain/hands).     DULoxetine (CYMBALTA) 60 MG capsule Take 1 capsule (60 mg total) by mouth daily. (Patient not taking: Reported on 09/01/2021) 30 capsule 6   famotidine (PEPCID) 20 MG tablet Take 1 tablet (20 mg total) by mouth at bedtime. 90 tablet 1   FARXIGA 10 MG TABS tablet Take 1 tablet (10 mg total) by mouth daily. 90 tablet 3   gabapentin (NEURONTIN) 600 MG tablet TAKE ONE TABLET BY MOUTH EVERY MORNING and TAKE ONE TABLET BY MOUTH EVERYDAY AT BEDTIME 60 tablet 3   glucose blood (ACCU-CHEK GUIDE) test strip Use to test blood sugar up to 3 times daily 100 each 3   insulin glargine, 1 Unit Dial, (TOUJEO SOLOSTAR) 300 UNIT/ML Solostar Pen Inject 26 Units into the skin daily. Eat a snack  with protein nightly before bedtime. 15 mL 6   insulin lispro (HUMALOG KWIKPEN) 100 UNIT/ML KwikPen Max daily 15 units 15 mL 11   Insulin Pen Needle (PEN NEEDLES) 31G X 8 MM MISC Check blood sugar 3-4 times a day as directed 100 each 2   Insulin Pen Needle 32G X 4 MM MISC 1 Device by Does not apply route in the morning, at noon, in the evening, and at bedtime. 150 each 6   ipratropium (ATROVENT) 0.06 % nasal spray Place 2 sprays into both nostrils 4 (four) times daily. 15 mL 3   ketorolac (ACULAR) 0.4 % SOLN Place 1 drop into the right eye 4 (four) times daily.     Lancets Misc. (ACCU-CHEK FASTCLIX LANCET) KIT Use to test blood sugar up to 3 times daily 1 kit 1   levothyroxine (SYNTHROID) 150 MCG tablet Take 1 tablet (150 mcg total) by mouth daily before breakfast. 90 tablet 1   magnesium oxide (MAG-OX) 400 (240 Mg) MG tablet Take 1 tablet (400 mg total) by mouth daily. 90 tablet 1   meclizine (ANTIVERT) 25 MG tablet Take 25 mg by mouth daily as needed for dizziness.      metoprolol succinate (TOPROL-XL) 25 MG 24 hr tablet Take 1 tablet (25 mg total) by mouth daily. Take with or immediately following a  meal. 90 tablet 3   nitroGLYCERIN (NITROSTAT) 0.4 MG SL tablet Place 1 tablet (0.4 mg total) under the tongue every 5 (five) minutes as needed for chest pain. 25 tablet 2   Polyethylene Glycol 3350 (MIRALAX PO) Take by mouth daily as needed.     prednisoLONE acetate (PRED FORTE) 1 % ophthalmic suspension Place 1 drop into the right eye 4 (four) times daily.     predniSONE (DELTASONE) 1 MG tablet Take 4 tablets (4 mg total) by mouth daily with breakfast. 120 tablet 1   predniSONE (DELTASONE) 5 MG tablet 2 tabs qam x 2weeks, then 1 tab qam 60 tablet 3   Propylene Glycol (SYSTANE BALANCE OP) Place 1 drop into both eyes daily as needed (for dry eyes).     Tafamidis 61 MG CAPS Take 61 mg by mouth daily. 30 capsule 12   tiZANidine (ZANAFLEX) 4 MG tablet Take 0.5-1 tablets (2-4 mg total) by mouth at bedtime  as needed for muscle spasms. 30 tablet 0   tobramycin (TOBREX) 0.3 % ophthalmic solution Place 1 drop into the right eye 4 (four) times daily.     torsemide (DEMADEX) 20 MG tablet Take 2 tablets (40 mg total) by mouth daily. Ok to take extra dose if wt gain of 1 lb in 1 day or 5 lbs in 7 days 180 tablet 3   trolamine salicylate (ASPERCREME) 10 % cream Apply 1 application topically as needed for muscle pain.     warfarin (COUMADIN) 5 MG tablet take 1-3 tablets daily OR AS DIRECTED by THE coumadin clinic 65 tablet 0   No current facility-administered medications on file prior to visit.     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 01/2015 Echo: EF 55-60%, Gr 2 DD, mod LVH, mildly dil LA.   Heart disease    Hypertension    Hypothyroidism    Joint pain    Kidney problem    Lower extremity edema    Mini stroke    Non-obstructive CAD    a. 01/2015 Cardiolite: + inf wall ischemia, EF 56%;  b. 01/2015 Cath: LM nl, LAD 42m LCX min irregs, RCA dominant, 50-623m  Sleep apnea    Swallowing difficulty    Thyroid disease    Allergies  Allergen Reactions   Ace Inhibitors Other (See Comments)    unknown   Amlodipine Other (See Comments)    unknown   Atenolol Other (See Comments)    bradycardia   Avandia [Rosiglitazone] Other (See Comments)    unknown   Darvon [Propoxyphene] Other (See Comments)    unknown   Erythromycin Itching   Hydralazine Other (See Comments)    Burning in throat and chest   Hydrocodone Other (See Comments)    Hallucinations.   Levofloxacin Itching   Morphine And Related Other (See Comments)    Dizzy and hallucianation, vomiting; Willing to try low dose    Percocet [Oxycodone-Acetaminophen] Other (See Comments)    hallucination   Spironolactone Other (See Comments)    unknown   Tramadol Other (See Comments)    Unknown/does not recall reaction but does not want to take again  Social History   Socioeconomic History   Marital status: Widowed    Spouse name: Not on file   Number of children: Not on file   Years of education: Not on file   Highest education level: Not on file  Occupational History   Occupation: Retired  Tobacco Use   Smoking status: Never   Smokeless tobacco: Never  Vaping Use   Vaping Use: Never used  Substance and Sexual Activity   Alcohol use: No   Drug use: Not on file   Sexual activity: Not Currently  Other Topics Concern   Not on file  Social History Narrative   Lives with daughter   Social Determinants of Health   Financial Resource Strain: Low Risk    Difficulty of Paying Living Expenses: Not hard at all  Food Insecurity: No Food Insecurity   Worried About Charity fundraiser in the Last Year: Never true   Arboriculturist in the Last Year: Never true  Transportation Needs: No Transportation Needs   Lack of Transportation (Medical): No   Lack of Transportation (Non-Medical): No  Physical Activity: Inactive   Days of Exercise per Week: 0 days   Minutes of Exercise per Session: 0 min  Stress: No Stress Concern Present   Feeling of Stress : Not at all  Social Connections: Moderately Isolated   Frequency of Communication with Friends and Family: More than three times a week   Frequency of Social Gatherings with Friends and Family: Twice a week   Attends Religious Services: More than 4 times per year   Active Member of [employer] or Organizations: No   Attends Archivist Meetings: Never   Marital Status: Widowed    There were no vitals filed for this visit. There is no height or weight on file to calculate BMI.  Physical Exam  ASSESSMENT AND PLAN:  There are no diagnoses linked to this  encounter.   No follow-ups on file.   [name]. [name], MD  [facility]. [place] office.  Discharge Instructions   None

## 2021-10-05 ENCOUNTER — Telehealth: Payer: Self-pay | Admitting: Cardiovascular Disease

## 2021-10-05 ENCOUNTER — Ambulatory Visit: Payer: Medicare Other | Admitting: Family Medicine

## 2021-10-05 NOTE — Telephone Encounter (Signed)
Pt reaching out to see if patient assistance paperwork has been started on pt is running low on her medication Tafamidis 61 MG CAPS and would like to get started prior to running out... please advise

## 2021-10-05 NOTE — Telephone Encounter (Signed)
Routed to primary nurse 

## 2021-10-06 NOTE — Telephone Encounter (Signed)
Received paperwork in for Tafamidis. I have given to Pharmacy team RN- who will let Regina [name] review when she is back in office.   I will route to make her aware it is there for her to review and let me know what I can do to help assist.   Thank you!

## 2021-10-07 NOTE — Telephone Encounter (Signed)
Paperwork reviewed and faxed to [facility].  Will file with our copies of forms.

## 2021-10-08 NOTE — Telephone Encounter (Signed)
Pt.notified

## 2021-10-08 NOTE — Telephone Encounter (Signed)
Pt is f/u on Tafamidis paperwork, she said she have 26  plls left. Please advise pt further

## 2021-10-12 ENCOUNTER — Encounter: Payer: Self-pay | Admitting: Family Medicine

## 2021-10-12 ENCOUNTER — Ambulatory Visit (INDEPENDENT_AMBULATORY_CARE_PROVIDER_SITE_OTHER): Payer: Medicare Other | Admitting: Family Medicine

## 2021-10-12 VITALS — BP 130/80 | HR 98 | Temp 97.9°F | Resp 16 | Ht 62.0 in | Wt 228.0 lb

## 2021-10-12 DIAGNOSIS — K5900 Constipation, unspecified: Secondary | ICD-10-CM | POA: Diagnosis not present

## 2021-10-12 DIAGNOSIS — L600 Ingrowing nail: Secondary | ICD-10-CM

## 2021-10-12 DIAGNOSIS — S91209A Unspecified open wound of unspecified toe(s) with damage to nail, initial encounter: Secondary | ICD-10-CM | POA: Diagnosis not present

## 2021-10-12 NOTE — Progress Notes (Signed)
Chief Complaint  Patient presents with   Toe Pain    Left big toe, pt has diabetes. Ongoing for months, but getting worse.    HPI: [name] is a 80 y.o. female with hx of DM II,HTN,OSA,atrial fib on chronic anticoagulation,and hypothyroidism  here today with her granddaughter complaining of a couple of months of left great toenail pain. She has not noted edema,erythema,or discharge. No hx of trauma.  Exacerbated by wearing close shoe wear, Problem is getting worse. She has not tried OTC medications.  Negative for fever,chills,numbness,or tingling.  -Requesting something for constipation. Problem is chronic. She is on Miralax daily. Described stools are soft but having difficulty passing stool. + Straining. No dyschezia.  Negative for abdominal pain,N/V,melena,or blood in stool. According to pt, she is allowed to drink 2 L of fluids per day due to CHF. Lab Results  Component Value Date   CREATININE 1.94 (H) 09/06/2021   BUN 48 (H) 09/06/2021   NA 140 09/06/2021   K 3.7 09/06/2021   CL 94 (L) 09/06/2021   CO2 29 09/06/2021   Lab Results  Component Value Date   WBC 6.6 04/02/2021   HGB 12.0 04/02/2021   HCT 37.7 04/02/2021   MCV 92.0 04/02/2021   PLT 249 04/02/2021   Lab Results  Component Value Date   TSH 0.65 06/09/2021   -She is not exercising regularly. Taking prednisone, dose is being weaning off. Trying to follow a healthful diet.  Review of Systems  Constitutional:  Negative for activity change, appetite change and chills.  Respiratory:  Negative for cough, shortness of breath and wheezing.   Cardiovascular:  Negative for chest pain and palpitations.  Musculoskeletal:  Positive for arthralgias. Negative for gait problem.  Neurological:  Negative for syncope and weakness.  Rest see pertinent positives and negatives per HPI.  Current Outpatient Medications on File Prior to Visit  Medication Sig Dispense Refill   Alcohol Swabs (B-D SINGLE USE  SWABS REGULAR) PADS Use to test blood sugar up to 3 times daily 100 each 3   allopurinol (ZYLOPRIM) 100 MG tablet Take 1 tablet (100 mg total) by mouth daily. 90 tablet 1   atorvastatin (LIPITOR) 20 MG tablet Take 1 tablet (20 mg total) by mouth daily. 90 tablet 1   Blood Glucose Monitoring Suppl (ACCU-CHEK GUIDE) w/Device KIT 1 Device by Does not apply route 3 (three) times daily. 1 kit 0   Capsaicin 0.033 % CREA Apply 1 application topically 3 (three) times daily. 56.6 g 11   Cholecalciferol (VITAMIN D3) 50 MCG (2000 UT) capsule Take 2,000 Units by mouth daily.     Continuous Blood Gluc Sensor (DEXCOM G6 SENSOR) MISC 1 Device by Does not apply route as directed. 9 each 3   Continuous Blood Gluc Transmit (DEXCOM G6 TRANSMITTER) MISC 1 Device by Does not apply route as directed. 1 each 3   diclofenac Sodium (VOLTAREN) 1 % GEL Apply 1 application topically daily as needed (arthritis pain/hands).     famotidine (PEPCID) 20 MG tablet Take 1 tablet (20 mg total) by mouth at bedtime. 90 tablet 1   FARXIGA 10 MG TABS tablet Take 1 tablet (10 mg total) by mouth daily. 90 tablet 3   gabapentin (NEURONTIN) 600 MG tablet TAKE ONE TABLET BY MOUTH EVERY MORNING and TAKE ONE TABLET BY MOUTH EVERYDAY AT BEDTIME 60 tablet 3   glucose blood (ACCU-CHEK GUIDE) test strip Use to test blood sugar up to 3 times daily 100 each 3  insulin glargine, 1 Unit Dial, (TOUJEO SOLOSTAR) 300 UNIT/ML Solostar Pen Inject 26 Units into the skin daily. Eat a snack with protein nightly before bedtime. 15 mL 6   insulin lispro (HUMALOG KWIKPEN) 100 UNIT/ML KwikPen Max daily 15 units 15 mL 11   Insulin Pen Needle (PEN NEEDLES) 31G X 8 MM MISC Check blood sugar 3-4 times a day as directed 100 each 2   Insulin Pen Needle 32G X 4 MM MISC 1 Device by Does not apply route in the morning, at noon, in the evening, and at bedtime. 150 each 6   ipratropium (ATROVENT) 0.06 % nasal spray Place 2 sprays into both nostrils 4 (four) times daily. 15  mL 3   ketorolac (ACULAR) 0.4 % SOLN Place 1 drop into the right eye 4 (four) times daily.     Lancets Misc. (ACCU-CHEK FASTCLIX LANCET) KIT Use to test blood sugar up to 3 times daily 1 kit 1   levothyroxine (SYNTHROID) 150 MCG tablet Take 1 tablet (150 mcg total) by mouth daily before breakfast. 90 tablet 1   magnesium oxide (MAG-OX) 400 (240 Mg) MG tablet Take 1 tablet (400 mg total) by mouth daily. 90 tablet 1   meclizine (ANTIVERT) 25 MG tablet Take 25 mg by mouth daily as needed for dizziness.      metoprolol succinate (TOPROL-XL) 25 MG 24 hr tablet Take 1 tablet (25 mg total) by mouth daily. Take with or immediately following a meal. 90 tablet 3   nitroGLYCERIN (NITROSTAT) 0.4 MG SL tablet Place 1 tablet (0.4 mg total) under the tongue every 5 (five) minutes as needed for chest pain. 25 tablet 2   Polyethylene Glycol 3350 (MIRALAX PO) Take by mouth daily as needed.     prednisoLONE acetate (PRED FORTE) 1 % ophthalmic suspension Place 1 drop into the right eye 4 (four) times daily.     predniSONE (DELTASONE) 1 MG tablet Take 4 tablets (4 mg total) by mouth daily with breakfast. 120 tablet 1   predniSONE (DELTASONE) 5 MG tablet 2 tabs qam x 2weeks, then 1 tab qam 60 tablet 3   Propylene Glycol (SYSTANE BALANCE OP) Place 1 drop into both eyes daily as needed (for dry eyes).     Tafamidis 61 MG CAPS Take 61 mg by mouth daily. 30 capsule 12   tiZANidine (ZANAFLEX) 4 MG tablet Take 0.5-1 tablets (2-4 mg total) by mouth at bedtime as needed for muscle spasms. 30 tablet 0   tobramycin (TOBREX) 0.3 % ophthalmic solution Place 1 drop into the right eye 4 (four) times daily.     trolamine salicylate (ASPERCREME) 10 % cream Apply 1 application topically as needed for muscle pain.     warfarin (COUMADIN) 5 MG tablet take 1-3 tablets daily OR AS DIRECTED by THE coumadin clinic 65 tablet 0   DULoxetine (CYMBALTA) 60 MG capsule Take 1 capsule (60 mg total) by mouth daily. (Patient not taking: Reported on  09/01/2021) 30 capsule 6   torsemide (DEMADEX) 20 MG tablet Take 2 tablets (40 mg total) by mouth daily. Ok to take extra dose if wt gain of 1 lb in 1 day or 5 lbs in 7 days 180 tablet 3   No current facility-administered medications on file prior to visit.   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 01/2015 Echo: EF 55-60%, Gr 2 DD, mod LVH, mildly dil LA.   Heart disease    Hypertension    Hypothyroidism    Joint pain    Kidney problem    Lower extremity edema    Mini stroke    Non-obstructive CAD    a. 01/2015 Cardiolite: + inf wall ischemia, EF 56%;  b. 01/2015 Cath: LM nl, LAD 69m LCX min irregs, RCA dominant, 50-663m  Sleep apnea    Swallowing difficulty    Thyroid disease    Allergies  Allergen Reactions   Ace Inhibitors Other (See Comments)    unknown   Amlodipine Other (See Comments)    unknown   Atenolol Other (See Comments)    bradycardia   Avandia [Rosiglitazone] Other (See Comments)    unknown   Darvon [Propoxyphene] Other (See Comments)    unknown   Erythromycin Itching   Hydralazine Other (See Comments)    Burning in throat and chest   Hydrocodone Other (See Comments)    Hallucinations.   Levofloxacin Itching   Morphine And Related Other (See Comments)    Dizzy and hallucianation, vomiting; Willing to try low dose   Percocet [Oxycodone-Acetaminophen] Other (See Comments)    hallucination   Spironolactone Other (See Comments)    unknown   Tramadol Other (See Comments)    Unknown/does not recall reaction but does not want to take again    Social History   Socioeconomic History   Marital status: Widowed    Spouse name: Not on file   Number of  children: Not on file   Years of education: Not on file   Highest education level: Not on file  Occupational History   Occupation: Retired  Tobacco Use   Smoking status: Never   Smokeless tobacco: Never  Vaping Use   Vaping Use: Never used  Substance and Sexual Activity   Alcohol use: No   Drug use: Not on file   Sexual activity: Not Currently  Other Topics Concern   Not on file  Social History Narrative   Lives with daughter   Social Determinants of Health   Financial Resource Strain: Low Risk    Difficulty of Paying Living Expenses: Not hard at all  Food Insecurity: No Food Insecurity   Worried About RuCharity fundraisern the Last Year: Never true   RaArboriculturistn the Last Year: Never true  Transportation Needs: No Transportation Needs   Lack of Transportation (Medical): No   Lack of Transportation (Non-Medical): No  Physical Activity: Inactive   Days of Exercise per Week: 0 days   Minutes of Exercise per Session: 0 min  Stress: No Stress Concern Present   Feeling of Stress : Not at all  Social Connections: Moderately Isolated   Frequency of Communication with Friends and Family: More than three times a week   Frequency of Social Gatherings with Friends and Family: Twice a week   Attends Religious Services: More than 4 times per year   Active Member of [employer] Organizations: No   Attends ClArchivisteetings: Never   Marital Status: Widowed    Vitals:   10/12/21 1415  BP: 130/80  Pulse: 98  Resp: 16  Temp: 97.9 F (36.6 C)  SpO2: 99%   Wt Readings  from Last 3 Encounters:  10/12/21 228 lb (103.4 kg)  09/01/21 224 lb 9.6 oz (101.9 kg)  08/13/21 221 lb (100.2 kg)   Body mass index is 41.7 kg/m.  Physical Exam Vitals and nursing note reviewed.  Constitutional:      General: She is not in acute distress.    Appearance: She is well-developed.  HENT:     Head: Normocephalic and atraumatic.  Eyes:     Conjunctiva/sclera: Conjunctivae  normal.  Cardiovascular:     Rate and Rhythm: Normal rate. Rhythm irregular.     Heart sounds: No murmur heard.    Comments: DP pulses present. Pulmonary:     Effort: Pulmonary effort is normal. No respiratory distress.     Breath sounds: Normal breath sounds.  Abdominal:     Palpations: Abdomen is soft.     Tenderness: There is no abdominal tenderness.  Musculoskeletal:       Feet:     Comments: Purple nail polish on. Left great toenail detached from nail bed and loose. Hypertrophic toenails.  Lymphadenopathy:     Cervical: No cervical adenopathy.  Skin:    General: Skin is warm.     Findings: No erythema or rash.  Neurological:     Mental Status: She is alert and oriented to person, place, and time.     Gait: Gait normal.  Psychiatric:     Comments: Well groomed, good eye contact.   ASSESSMENT AND PLAN:  Ms.Jet was seen today for toe pain.  Diagnoses and all orders for this visit:  Avulsion of toenail, initial encounter ? Traumatic. No associated edema or erythema. We discussed appropriate foot care. Instructed to trim toenail down.  Ingrown left big toenail Mild. No signs of infection. Recommend soaking foot in warm water with vineger 1:1 x 10-15 min and with a Q tip pull cuticle away from toenail.She can genteelly lift corner of toenail to prevent progression. Instructed about warning signs.  Morbid obesity ([place]) Steadily gaining wt. Prednisone could be a contributing factor. We discussed benefits of wt loss as well as adverse effects of obesity. Consistency with following a healthful diet and low impact physical activity (like tai chi) encouraged.  Constipation, unspecified constipation type Recommend decreasing or stopping Miralax and trying Dulcolax 5 mg daily as needed. Continue adequate fiber and fluid intake. Avoid straining.  Return if symptoms worsen or fail to improve, for Keep next f/u appt.  Alejandrina Raimer G. [name], MD  [facility]. [place] office.

## 2021-10-12 NOTE — Patient Instructions (Signed)
A few things to remember from today's visit:  Avulsion of toenail, initial encounter  Ingrown left big toenail  If you need refills please call your pharmacy. Do not use My Chart to request refills or for acute issues that need immediate attention.   Trimmed toenail. Soaking foot with warm water and vinegar 1:1 for 10 to 15 min then moving cuticle away from toenail will help. Monitor for redness,edema,or drainage.  Please be sure medication list is accurate. If a new problem present, please set up appointment sooner than planned today.

## 2021-10-13 ENCOUNTER — Other Ambulatory Visit: Payer: Self-pay | Admitting: Family Medicine

## 2021-10-13 ENCOUNTER — Other Ambulatory Visit: Payer: Self-pay | Admitting: Cardiovascular Disease

## 2021-10-13 ENCOUNTER — Other Ambulatory Visit: Payer: Self-pay | Admitting: Internal Medicine

## 2021-10-14 ENCOUNTER — Telehealth: Payer: Self-pay | Admitting: Pharmacist

## 2021-10-14 NOTE — Chronic Care Management (AMB) (Signed)
Chronic Care Management Pharmacy Assistant   Name: Regina [name]  MRN: [id number] DOB: 04-03-1941  Reason for Encounter: Disease State and Medication Review / Hypertension Assessment and Medication Coordination Call   Conditions to be addressed/monitored: HTN  Recent office visits:  10/12/2021 [name] MD - Patient was seen for Avulsion of toenail and additional issues. No medication changes.  Follow up if symptoms worsen or fail to improve, for Keep next f/u appt.  Recent consult visits:  None  Hospital visits:  None  Medications: Outpatient Encounter Medications as of 10/14/2021  Medication Sig Note   Alcohol Swabs (B-D SINGLE USE SWABS REGULAR) PADS Use to test blood sugar up to 3 times daily    allopurinol (ZYLOPRIM) 100 MG tablet Take 1 tablet (100 mg total) by mouth daily.    atorvastatin (LIPITOR) 20 MG tablet Take 1 tablet (20 mg total) by mouth daily.    Blood Glucose Monitoring Suppl (ACCU-CHEK GUIDE) w/Device KIT 1 Device by Does not apply route 3 (three) times daily.    Capsaicin 0.033 % CREA Apply 1 application topically 3 (three) times daily.    Cholecalciferol (VITAMIN D3) 50 MCG (2000 UT) capsule Take 2,000 Units by mouth daily.    Continuous Blood Gluc Sensor (DEXCOM G6 SENSOR) MISC 1 Device by Does not apply route as directed.    Continuous Blood Gluc Transmit (DEXCOM G6 TRANSMITTER) MISC 1 Device by Does not apply route as directed.    diclofenac Sodium (VOLTAREN) 1 % GEL Apply 1 application topically daily as needed (arthritis pain/hands).    DULoxetine (CYMBALTA) 60 MG capsule Take 1 capsule (60 mg total) by mouth daily. (Patient not taking: Reported on 09/01/2021)    famotidine (PEPCID) 20 MG tablet Take 1 tablet (20 mg total) by mouth at bedtime.    FARXIGA 10 MG TABS tablet Take 1 tablet (10 mg total) by mouth daily.    gabapentin (NEURONTIN) 600 MG tablet TAKE ONE TABLET BY MOUTH EVERY MORNING and TAKE ONE TABLET BY MOUTH EVERYDAY AT BEDTIME     glucose blood (ACCU-CHEK GUIDE) test strip Use to test blood sugar up to 3 times daily    insulin glargine, 1 Unit Dial, (TOUJEO SOLOSTAR) 300 UNIT/ML Solostar Pen Inject 26 Units into the skin daily. Eat a snack with protein nightly before bedtime.    insulin lispro (HUMALOG KWIKPEN) 100 UNIT/ML KwikPen Max daily 15 units    Insulin Pen Needle (PEN NEEDLES) 31G X 8 MM MISC Check blood sugar 3-4 times a day as directed    Insulin Pen Needle 32G X 4 MM MISC 1 Device by Does not apply route in the morning, at noon, in the evening, and at bedtime.    ipratropium (ATROVENT) 0.06 % nasal spray Place 2 sprays into both nostrils 4 (four) times daily.    ketorolac (ACULAR) 0.4 % SOLN Place 1 drop into the right eye 4 (four) times daily.    Lancets Misc. (ACCU-CHEK FASTCLIX LANCET) KIT Use to test blood sugar up to 3 times daily    levothyroxine (SYNTHROID) 150 MCG tablet TAKE ONE TABLET BY MOUTH BEFORE BREAKFAST    magnesium oxide (MAG-OX) 400 (240 Mg) MG tablet Take 1 tablet (400 mg total) by mouth daily.    meclizine (ANTIVERT) 25 MG tablet Take 25 mg by mouth daily as needed for dizziness.  01/08/2021: prn   metoprolol succinate (TOPROL-XL) 25 MG 24 hr tablet Take 1 tablet (25 mg total) by mouth daily. Take with or immediately  following a meal.    nitroGLYCERIN (NITROSTAT) 0.4 MG SL tablet Place 1 tablet (0.4 mg total) under the tongue every 5 (five) minutes as needed for chest pain.    Polyethylene Glycol 3350 (MIRALAX PO) Take by mouth daily as needed.    prednisoLONE acetate (PRED FORTE) 1 % ophthalmic suspension Place 1 drop into the right eye 4 (four) times daily.    predniSONE (DELTASONE) 1 MG tablet Take 4 tablets (4 mg total) by mouth daily with breakfast.    predniSONE (DELTASONE) 5 MG tablet 2 tabs qam x 2weeks, then 1 tab qam    Propylene Glycol (SYSTANE BALANCE OP) Place 1 drop into both eyes daily as needed (for dry eyes).    Tafamidis 61 MG CAPS Take 61 mg by mouth daily.    tiZANidine  (ZANAFLEX) 4 MG tablet Take 0.5-1 tablets (2-4 mg total) by mouth at bedtime as needed for muscle spasms.    tobramycin (TOBREX) 0.3 % ophthalmic solution Place 1 drop into the right eye 4 (four) times daily.    torsemide (DEMADEX) 20 MG tablet TAKE TWO TABLETS BY MOUTH EVERY MORNING Ok to take extra dose if weight gain of 1lb in 1 day or 5 lbs in 7 days    trolamine salicylate (ASPERCREME) 10 % cream Apply 1 application topically as needed for muscle pain.    warfarin (COUMADIN) 5 MG tablet Take 1/2 to 1 tablet by mouth daily or as directed by the Coumadin clinic    No facility-administered encounter medications on file as of 10/14/2021.  Fill History:  allopurinol 100 mg tablet 09/20/2021 30   atorvastatin 20 mg tablet 09/20/2021 30   duloxetine 60 mg capsule,delayed release 08/11/2021 17   Farxiga 10 mg tablet 09/20/2021 30   gabapentin 600 mg tablet 09/20/2021 30   Toujeo SoloStar U-300 Insulin 300 unit/mL (1.5 mL) subcutaneous pen 08/23/2021 52   Humalog KwikPen (U-100) Insulin 100 unit/mL subcutaneous 07/15/2021 100   ipratropium bromide 42 mcg (0.06 %) nasal spray 09/14/2021 11   ketorolac 0.4 % eye drops 08/23/2021 25   levothyroxine 150 mcg tablet 09/20/2021 30   magnesium oxide 400 mg (241.3 mg magnesium) tablet 09/20/2021 30   metoprolol succinate ER 25 mg tablet,extended release 24 hr 09/20/2021 30   nitroglycerin 0.4 mg sublingual tablet 08/10/2021 30   TORSEMIDE  20 MG TABS 09/20/2021 36   warfarin 5 mg tablet 09/20/2021 22   Reviewed chart prior to disease state call. Spoke with patient regarding BP  Recent Office Vitals: BP Readings from Last 3 Encounters:  10/12/21 130/80  09/01/21 135/84  08/13/21 120/78   Pulse Readings from Last 3 Encounters:  10/12/21 98  09/01/21 94  08/13/21 80    Wt Readings from Last 3 Encounters:  10/12/21 228 lb (103.4 kg)  09/01/21 224 lb 9.6 oz (101.9 kg)  08/13/21 221 lb (100.2 kg)     Kidney Function Lab Results   Component Value Date/Time   CREATININE 1.94 (H) 09/06/2021 12:36 PM   CREATININE 1.94 (H) 08/13/2021 03:01 PM   GFR 24.47 (L) 08/13/2021 03:01 PM   GFRNONAA 25 (L) 04/02/2021 01:22 PM   GFRAA 30 (L) 08/03/2020 09:13 AM    BMP Latest Ref Rng & Units 09/06/2021 08/13/2021 05/10/2021  Glucose 70 - 99 mg/dL 162(H) 190(H) 80  BUN 8 - 27 mg/dL 48(H) 59(H) 51(H)  Creatinine 0.57 - 1.00 mg/dL 1.94(H) 1.94(H) 2.30(H)  BUN/Creat Ratio 12 - 28 25 - 22  Sodium 134 - 144 mmol/L  140 137 145(H)  Potassium 3.5 - 5.2 mmol/L 3.7 3.5 4.2  Chloride 96 - 106 mmol/L 94(L) 94(L) 100  CO2 20 - 29 mmol/L 29 35(H) 29  Calcium 8.7 - 10.3 mg/dL 9.1 9.5 10.4(H)    Current antihypertensive regimen:  Metoprolol 25 mg daily  How often are you checking your Blood Pressure?  Patient is not checking blood pressures, she does not have a blood pressure monitor at home however, plans to purchase a blood pressure monitor  Current home BP readings: Patient is not checking blood pressures.  What recent interventions/DTPs have been made by any provider to improve Blood Pressure control since last CPP Visit: No recent interventions.  Any recent hospitalizations or ED visits since last visit with CPP? No recent hospital visits.  What diet changes have been made to improve Blood Pressure Control? Patient states she will have: Eggs and oatmeal for breakfast Snack for lunch of popcorn and fruits She will have meats and vegetables for dinner  What exercise is being done to improve your Blood Pressure Control?  Patient will go walking with her friend 1-2 times per week. Patient plans to start stretching daily  Adherence Review: Is the patient currently on ACE/ARB medication? No Does the patient have >5 day gap between last estimated fill dates? No   Reviewed chart for medication changes ahead of medication coordination call.  No OVs, Consults, or hospital visits since last care coordination call/Pharmacist visit. (If  appropriate, list visit date, provider name)  No medication changes indicated OR if recent visit, treatment plan here.  BP Readings from Last 3 Encounters:  10/12/21 130/80  09/01/21 135/84  08/13/21 120/78    Lab Results  Component Value Date   HGBA1C 7.6 (A) 08/13/2021       Patient obtains medications through Adherence Packaging  30 Days    Last adherence delivery included: Pen needles 8 mm x 31 g x 5/16 (patient prefers these for both Toujeo and Humalog) Gabapentin 600 mg tablet  1 tablet at breakfast and 1 tablet at dinner Metoprolol succinate 25 mg tablets 1 tablet at breakfast Allopurinol 100 mg 1 tablet at breakfast Atorvastatin 20 mg 1 tablet at bedtime Cetirizine 10 mg 1 tablet at breakfast Levothyroxine 150 mcg 1 tablet before breakfast Farxiga 10 mg 1 tablet at breakfast Magnesium oxide 400 mg 1 tablet at breakfast Warfarin 5 mg tablet (easy top vials) Torsemide 20 mg 2 tablets at breakfast and as needed (extra in an easy top vial) ACCU CHEK test strips - use to check blood sugar three times daily.   Patient declined (meds) last month:  Humalog, Toujeo and Nitroglycerin states she has plenty on hand.  Duloxetine she is not taking and never has taken this.   Patient is due for next adherence delivery on: 10/25/2021.   Called patient and reviewed medications and coordinated delivery.   This delivery to include:   Pen needles 8 mm x 31 g x 5/16 (patient prefers these for both Toujeo and Humalog) Gabapentin 600 mg tablet  1 tablet at breakfast and 1 tablet at dinner Metoprolol succinate 25 mg tablets 1 tablet at breakfast Allopurinol 100 mg 1 tablet at breakfast Atorvastatin 20 mg 1 tablet at bedtime Cetirizine 10 mg 1 tablet at breakfast Levothyroxine 150 mcg 1 tablet before breakfast Farxiga 10 mg 1 tablet at breakfast Magnesium oxide 400 mg 1 tablet at breakfast Warfarin 5 mg tablet (easy top vials) Torsemide 20 mg 2 tablets at breakfast and as needed  (  extra in an easy top vial) ACCU CHEK test strips - use to check blood sugar three times daily. Humalog Kwikpen 100 un/ml - max daily 15 units Toujeo Solostar 300 un/ml - Inject 26 Units into the skin daily     Patient will need a short fill:No short fills needed   Coordinated acute fill:No acute fills needed   Patient declined the following medications: No declined medications     Confirmed delivery date of 10/25/2021, advised patient that pharmacy will contact them the morning of delivery.    Care Gaps: AWV - scheduled for 02/16/22 Last A1C - 7.6 on 08/13/2021 Last BP - 130/80 on 10/12/2021 Zoster vaccines - due Urine microalbumin - overdue  Foot exam - overdue Covid-19 vaccine - overdue  Flu vaccine - due  Star Rating Drugs: Atorvastatin 43m - last [facility]- last filled on 08/18/2021 [facility] at [phone]

## 2021-10-19 ENCOUNTER — Other Ambulatory Visit: Payer: Self-pay

## 2021-10-19 ENCOUNTER — Telehealth: Payer: Self-pay | Admitting: Cardiovascular Disease

## 2021-10-19 DIAGNOSIS — I5022 Chronic systolic (congestive) heart failure: Secondary | ICD-10-CM

## 2021-10-19 NOTE — Telephone Encounter (Signed)
Pt is reaching out wanting to speak w/ Dr. Marisue Ivan in regards to medications please advise

## 2021-10-19 NOTE — Telephone Encounter (Signed)
Called patient, advised that I had the paperwork, signed by Dr.O'Neal and was given back to [facility]. I will check with them today in regards to this and make sure it was faxed off.   Patient verbalized understanding.    Patient also mentions having increased SOB, she states she has to stop multiple times going up her steps because she can not catch her breath. I advised I would make Dr.O'Neal aware, she is scheduled to be seen in February  Thanks!

## 2021-10-19 NOTE — Telephone Encounter (Signed)
I called patient, advised of message below from MD.  Patient verbalized understanding.  BMP ordered.

## 2021-10-20 DIAGNOSIS — E1122 Type 2 diabetes mellitus with diabetic chronic kidney disease: Secondary | ICD-10-CM | POA: Diagnosis not present

## 2021-10-20 DIAGNOSIS — I509 Heart failure, unspecified: Secondary | ICD-10-CM | POA: Diagnosis not present

## 2021-10-20 DIAGNOSIS — I129 Hypertensive chronic kidney disease with stage 1 through stage 4 chronic kidney disease, or unspecified chronic kidney disease: Secondary | ICD-10-CM | POA: Diagnosis not present

## 2021-10-20 DIAGNOSIS — E854 Organ-limited amyloidosis: Secondary | ICD-10-CM | POA: Diagnosis not present

## 2021-10-20 DIAGNOSIS — I43 Cardiomyopathy in diseases classified elsewhere: Secondary | ICD-10-CM | POA: Diagnosis not present

## 2021-10-20 DIAGNOSIS — E785 Hyperlipidemia, unspecified: Secondary | ICD-10-CM | POA: Diagnosis not present

## 2021-10-20 DIAGNOSIS — I2510 Atherosclerotic heart disease of native coronary artery without angina pectoris: Secondary | ICD-10-CM | POA: Diagnosis not present

## 2021-10-20 DIAGNOSIS — N1832 Chronic kidney disease, stage 3b: Secondary | ICD-10-CM | POA: Diagnosis not present

## 2021-10-20 LAB — BASIC METABOLIC PANEL
BUN: 35 — AB (ref 4–21)
CO2: 30 — AB (ref 13–22)
Chloride: 97 — AB (ref 99–108)
Creatinine: 1.9 — AB (ref 0.5–1.1)
Glucose: 151
Potassium: 3.4 (ref 3.4–5.3)
Sodium: 143 (ref 137–147)

## 2021-10-20 LAB — COMPREHENSIVE METABOLIC PANEL
Albumin: 4.1 (ref 3.5–5.0)
Calcium: 9 (ref 8.7–10.7)
GFR calc Af Amer: 26

## 2021-10-28 ENCOUNTER — Encounter: Payer: Self-pay | Admitting: Family Medicine

## 2021-10-29 ENCOUNTER — Other Ambulatory Visit: Payer: Self-pay

## 2021-10-29 ENCOUNTER — Ambulatory Visit: Payer: Medicare Other

## 2021-10-29 ENCOUNTER — Telehealth: Payer: Self-pay

## 2021-10-29 DIAGNOSIS — Z7901 Long term (current) use of anticoagulants: Secondary | ICD-10-CM

## 2021-10-29 DIAGNOSIS — I5022 Chronic systolic (congestive) heart failure: Secondary | ICD-10-CM | POA: Diagnosis not present

## 2021-10-29 DIAGNOSIS — I4819 Other persistent atrial fibrillation: Secondary | ICD-10-CM | POA: Diagnosis not present

## 2021-10-29 LAB — POCT INR: INR: 2.5 (ref 2.0–3.0)

## 2021-10-29 NOTE — Patient Instructions (Signed)
Continue taking 1/2 tablet daily except 1 tablet each Sunday.  Repeat INR in 4 weeks; [phone];

## 2021-10-29 NOTE — Telephone Encounter (Signed)
Called patient, advised of response form MD. Patient will wait to hear back from assistance.

## 2021-10-29 NOTE — Telephone Encounter (Signed)
Patient called in, she states that she is still waiting to get the Tafamidis, she states the she will be out of the medication. She is going out of town tomorrow and will run out of medication, she is not sure what to do.   I advised I would route to MD to make aware, do you have any recommendations until we hear from the assistance?

## 2021-10-30 LAB — BASIC METABOLIC PANEL
BUN/Creatinine Ratio: 17 (ref 12–28)
BUN: 34 mg/dL — ABNORMAL HIGH (ref 8–27)
CO2: 29 mmol/L (ref 20–29)
Calcium: 9.2 mg/dL (ref 8.7–10.3)
Chloride: 98 mmol/L (ref 96–106)
Creatinine, Ser: 1.98 mg/dL — ABNORMAL HIGH (ref 0.57–1.00)
Glucose: 87 mg/dL (ref 70–99)
Potassium: 4.3 mmol/L (ref 3.5–5.2)
Sodium: 143 mmol/L (ref 134–144)
eGFR: 25 mL/min/{1.73_m2} — ABNORMAL LOW (ref >59)

## 2021-11-04 ENCOUNTER — Telehealth: Payer: Self-pay | Admitting: Family Medicine

## 2021-11-04 NOTE — Telephone Encounter (Signed)
Patient stated that she is hurting under left breast that's on top of her stomach. It is painful sometimes when she moves and she believes its a lump or mass.   I scheduled the patient for Monday, January 23rd at 11:30am to speak with [name] about this.  Please advise.

## 2021-11-05 NOTE — Telephone Encounter (Signed)
Noted  

## 2021-11-05 NOTE — Progress Notes (Signed)
ACUTE VISIT Chief Complaint  Patient presents with   lump under left breast   HPI: [name] is a 81 y.o. female with hx of GERD,hypothyroidism,DM II, amyloidosis,CKD, CAD, and HTN here today with her granddaughter complaining of pain around left rib cage. This is a new problem.  Exacerbated by movement and certain positions. Radiated to left low back.  Pain started 3-4 weeks ago. Pain has been worse for the past week. 5-7/10 sharp.  No hx of trauma.  She feels like "something is there."  No skin changes, numbness,or tingling. She has not tried OTC medication. It last a few minutes at the time. Occasional cough, no wheezing.  Thinking that it could be heart related she tried extra torsemide pill but did not help. Her daughter states that she seemed SOB today with exertion, she states that she has had DOE intermittently for "a while." Negative for CP,palpitation, diaphoresis,LE pain or erythema, or worsening edema. Negative for orthopnea and PND.  Lab Results  Component Value Date   CREATININE 1.98 (H) 10/29/2021   BUN 34 (H) 10/29/2021   NA 143 10/29/2021   K 4.3 10/29/2021   CL 98 10/29/2021   CO2 29 10/29/2021   She has not noted changes in bowel habits,nausea,or vomiting.  Review of Systems  Constitutional:  Negative for activity change, appetite change, fatigue and fever.  HENT:  Negative for mouth sores, nosebleeds and sore throat.   Eyes:  Negative for redness and visual disturbance.  Gastrointestinal:  Positive for abdominal distention. Negative for abdominal pain and blood in stool.  Genitourinary:  Negative for decreased urine volume, dysuria and hematuria.  Neurological:  Negative for syncope, weakness and headaches.  Rest see pertinent positives and negatives per HPI.  Current Outpatient Medications on File Prior to Visit  Medication Sig Dispense Refill   Alcohol Swabs (B-D SINGLE USE SWABS REGULAR) PADS Use to test blood sugar up to 3 times  daily 100 each 3   allopurinol (ZYLOPRIM) 100 MG tablet Take 1 tablet (100 mg total) by mouth daily. 90 tablet 1   atorvastatin (LIPITOR) 20 MG tablet Take 1 tablet (20 mg total) by mouth daily. 90 tablet 1   Blood Glucose Monitoring Suppl (ACCU-CHEK GUIDE) w/Device KIT 1 Device by Does not apply route 3 (three) times daily. 1 kit 0   Capsaicin 0.033 % CREA Apply 1 application topically 3 (three) times daily. 56.6 g 11   Cholecalciferol (VITAMIN D3) 50 MCG (2000 UT) capsule Take 2,000 Units by mouth daily.     COMFORT EZ PEN NEEDLES 31G X 8 MM MISC USE 3 TO 4 times daily as directed 100 each 2   Continuous Blood Gluc Sensor (DEXCOM G6 SENSOR) MISC 1 Device by Does not apply route as directed. 9 each 3   Continuous Blood Gluc Transmit (DEXCOM G6 TRANSMITTER) MISC 1 Device by Does not apply route as directed. 1 each 3   diclofenac Sodium (VOLTAREN) 1 % GEL Apply 1 application topically daily as needed (arthritis pain/hands).     DULoxetine (CYMBALTA) 60 MG capsule Take 1 capsule (60 mg total) by mouth daily. 30 capsule 6   famotidine (PEPCID) 20 MG tablet Take 1 tablet (20 mg total) by mouth at bedtime. 90 tablet 1   FARXIGA 10 MG TABS tablet Take 1 tablet (10 mg total) by mouth daily. 90 tablet 3   gabapentin (NEURONTIN) 600 MG tablet TAKE ONE TABLET BY MOUTH EVERY MORNING and TAKE ONE TABLET BY MOUTH EVERYDAY AT BEDTIME  60 tablet 3   glucose blood (ACCU-CHEK GUIDE) test strip Use to test blood sugar up to 3 times daily 100 each 3   insulin glargine, 1 Unit Dial, (TOUJEO SOLOSTAR) 300 UNIT/ML Solostar Pen Inject 26 Units into the skin daily. Eat a snack with protein nightly before bedtime. 15 mL 6   insulin lispro (HUMALOG KWIKPEN) 100 UNIT/ML KwikPen Max daily 15 units 15 mL 11   Insulin Pen Needle 32G X 4 MM MISC 1 Device by Does not apply route in the morning, at noon, in the evening, and at bedtime. 150 each 6   ipratropium (ATROVENT) 0.06 % nasal spray Place 2 sprays into both nostrils 4 (four)  times daily. 15 mL 3   ketorolac (ACULAR) 0.4 % SOLN Place 1 drop into the right eye 4 (four) times daily.     Lancets Misc. (ACCU-CHEK FASTCLIX LANCET) KIT Use to test blood sugar up to 3 times daily 1 kit 1   levothyroxine (SYNTHROID) 150 MCG tablet TAKE ONE TABLET BY MOUTH BEFORE BREAKFAST 90 tablet 1   magnesium oxide (MAG-OX) 400 (240 Mg) MG tablet Take 1 tablet (400 mg total) by mouth daily. 90 tablet 1   meclizine (ANTIVERT) 25 MG tablet Take 25 mg by mouth daily as needed for dizziness.      metoprolol succinate (TOPROL-XL) 25 MG 24 hr tablet Take 1 tablet (25 mg total) by mouth daily. Take with or immediately following a meal. 90 tablet 3   nitroGLYCERIN (NITROSTAT) 0.4 MG SL tablet Place 1 tablet (0.4 mg total) under the tongue every 5 (five) minutes as needed for chest pain. 25 tablet 2   Polyethylene Glycol 3350 (MIRALAX PO) Take by mouth daily as needed.     prednisoLONE acetate (PRED FORTE) 1 % ophthalmic suspension Place 1 drop into the right eye 4 (four) times daily.     predniSONE (DELTASONE) 1 MG tablet Take 4 tablets (4 mg total) by mouth daily with breakfast. 120 tablet 1   predniSONE (DELTASONE) 5 MG tablet 2 tabs qam x 2weeks, then 1 tab qam 60 tablet 3   Propylene Glycol (SYSTANE BALANCE OP) Place 1 drop into both eyes daily as needed (for dry eyes).     Tafamidis 61 MG CAPS Take 61 mg by mouth daily. 30 capsule 12   tiZANidine (ZANAFLEX) 4 MG tablet Take 0.5-1 tablets (2-4 mg total) by mouth at bedtime as needed for muscle spasms. 30 tablet 0   tobramycin (TOBREX) 0.3 % ophthalmic solution Place 1 drop into the right eye 4 (four) times daily.     torsemide (DEMADEX) 20 MG tablet TAKE TWO TABLETS BY MOUTH EVERY MORNING Ok to take extra dose if weight gain of 1lb in 1 day or 5 lbs in 7 days 180 tablet 3   trolamine salicylate (ASPERCREME) 10 % cream Apply 1 application topically as needed for muscle pain.     warfarin (COUMADIN) 5 MG tablet Take 1/2 to 1 tablet by mouth daily  or as directed by the Coumadin clinic 30 tablet 0   No current facility-administered medications on file prior to visit.   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 01/2015 Echo: EF 55-60%, Gr 2 DD, mod LVH, mildly dil LA.   Heart disease    Hypertension    Hypothyroidism    Joint pain    Kidney problem    Lower extremity edema    Mini stroke    Non-obstructive CAD    a. 01/2015 Cardiolite: + inf wall ischemia, EF 56%;  b. 01/2015 Cath: LM nl, LAD 67m LCX min irregs, RCA dominant, 50-64m  Sleep apnea    Swallowing difficulty    Thyroid disease    Allergies  Allergen Reactions   Ace Inhibitors Other (See Comments)    unknown   Amlodipine Other (See Comments)    unknown   Atenolol Other (See Comments)    bradycardia   Avandia [Rosiglitazone] Other (See Comments)    unknown   Darvon [Propoxyphene] Other (See Comments)    unknown   Erythromycin Itching   Hydralazine Other (See Comments)    Burning in throat and chest   Hydrocodone Other (See Comments)    Hallucinations.   Levofloxacin Itching   Morphine And Related Other (See Comments)    Dizzy and hallucianation, vomiting; Willing to try low dose   Percocet [Oxycodone-Acetaminophen] Other (See Comments)    hallucination   Spironolactone Other (See Comments)    unknown   Tramadol Other (See Comments)    Unknown/does not recall reaction but does not want to take again    Social History   Socioeconomic History   Marital status: Widowed    Spouse name: Not on file   Number of children: Not on file   Years of education: Not on file   Highest education level: Not on file  Occupational History   Occupation:  Retired  Tobacco Use   Smoking status: Never   Smokeless tobacco: Never  Vaping Use   Vaping Use: Never used  Substance and Sexual Activity   Alcohol use: No   Drug use: Not on file   Sexual activity: Not Currently  Other Topics Concern   Not on file  Social History Narrative   Lives with daughter   Social Determinants of Health   Financial Resource Strain: Low Risk    Difficulty of Paying Living Expenses: Not hard at all  Food Insecurity: No Food Insecurity   Worried About RuCharity fundraisern the Last Year: Never true   RaArboriculturistn the Last Year: Never true  Transportation Needs: No Transportation Needs   Lack of Transportation (Medical): No   Lack of Transportation (Non-Medical): No  Physical Activity: Inactive   Days of Exercise per Week: 0 days   Minutes of Exercise per Session: 0 min  Stress: No Stress Concern Present   Feeling of Stress : Not at all  Social Connections: Moderately Isolated   Frequency of Communication with Friends and Family: More than three times a week   Frequency of Social Gatherings with Friends and Family: Twice a week   Attends Religious Services: More than 4 times per year   Active Member of [employer] Organizations: No   Attends ClArchivisteetings: Never   Marital Status: Widowed    Vitals:   11/08/21 1125  BP: 128/80  Pulse: 96  Resp: 16  SpO2: 99%   Body mass index is 41.34 kg/m.  Physical Exam Vitals and nursing note reviewed.  Constitutional:      General: She is not in acute distress.    Appearance: She is well-developed.  HENT:  Head: Normocephalic and atraumatic.     Mouth/Throat:     Mouth: Mucous membranes are moist.     Pharynx: Oropharynx is clear.  Eyes:     Conjunctiva/sclera: Conjunctivae normal.  Neck:     Vascular: No JVD.  Cardiovascular:     Rate and Rhythm: Normal rate and regular rhythm.     Pulses:          Dorsalis pedis pulses are 2+ on the right side and 2+ on the left side.      Heart sounds: No murmur heard.    Comments: Trace pitting LE edema, bilateral. No calves tenderness. Pulmonary:     Effort: Pulmonary effort is normal. No respiratory distress.     Breath sounds: Normal breath sounds.  Chest:     Chest wall: No deformity, tenderness or crepitus.  Abdominal:     General: There is distension (Mild).     Palpations: Abdomen is soft. There is no fluid wave, hepatomegaly, splenomegaly, mass or pulsatile mass.     Tenderness: There is abdominal tenderness in the left upper quadrant. There is no guarding or rebound.    Musculoskeletal:     Thoracic back: No tenderness or bony tenderness.  Lymphadenopathy:     Cervical: No cervical adenopathy.  Skin:    General: Skin is warm.     Findings: No erythema or rash.  Neurological:     General: No focal deficit present.     Mental Status: She is alert and oriented to person, place, and time.     Cranial Nerves: No cranial nerve deficit.     Gait: Gait normal.  Psychiatric:     Comments: Well groomed, good eye contact.   ASSESSMENT AND PLAN:  [name] was seen today for lump under left breast.  Diagnoses and all orders for this visit: Orders Placed This Encounter  Procedures   DG Chest 2 View   US Abdomen Limited   LUQ abdominal pain On examination today LUQ pain, rib cage pain was not reproducible today.  We discussed possible etiologies. ? Musculoskeletal. Abdomen mildly distended but soft. We will arranged abdominal US. Instructed about warning signs.  DOE (dyspnea on exertion) She is not symptomatic at this time. Reporting problem as intermittent for a while. CXR ordered. Instructed about warning signs. Next appt with her cardiologist on 11/23/21.  Return if symptoms worsen or fail to improve, for Keep next f/u appt.  Isiaah Cuervo G. [name], MD  [facility]. [place] office.

## 2021-11-08 ENCOUNTER — Ambulatory Visit (INDEPENDENT_AMBULATORY_CARE_PROVIDER_SITE_OTHER): Payer: Medicare Other

## 2021-11-08 ENCOUNTER — Ambulatory Visit (INDEPENDENT_AMBULATORY_CARE_PROVIDER_SITE_OTHER): Payer: Medicare Other | Admitting: Family Medicine

## 2021-11-08 ENCOUNTER — Other Ambulatory Visit: Payer: Self-pay

## 2021-11-08 ENCOUNTER — Encounter: Payer: Self-pay | Admitting: Family Medicine

## 2021-11-08 VITALS — BP 128/80 | HR 96 | Resp 16 | Ht 62.0 in | Wt 226.0 lb

## 2021-11-08 DIAGNOSIS — R1012 Left upper quadrant pain: Secondary | ICD-10-CM | POA: Diagnosis not present

## 2021-11-08 DIAGNOSIS — R059 Cough, unspecified: Secondary | ICD-10-CM | POA: Diagnosis not present

## 2021-11-08 DIAGNOSIS — R0609 Other forms of dyspnea: Secondary | ICD-10-CM

## 2021-11-08 DIAGNOSIS — R079 Chest pain, unspecified: Secondary | ICD-10-CM | POA: Diagnosis not present

## 2021-11-08 DIAGNOSIS — R0602 Shortness of breath: Secondary | ICD-10-CM | POA: Diagnosis not present

## 2021-11-08 NOTE — Patient Instructions (Signed)
A few things to remember from today's visit:  LUQ abdominal pain - Plan: US Abdomen Limited  DOE (dyspnea on exertion) - Plan: DG Chest 2 View  If you need refills please call your pharmacy. Do not use My Chart to request refills or for acute issues that need immediate attention.   ? Musculoskeletal pain. If shortness of breath gets suddenly worse, you need tp seek immediate medical attention.  Please be sure medication list is accurate. If a new problem present, please set up appointment sooner than planned today.

## 2021-11-11 ENCOUNTER — Other Ambulatory Visit: Payer: Self-pay | Admitting: Family Medicine

## 2021-11-11 ENCOUNTER — Other Ambulatory Visit: Payer: Self-pay | Admitting: Cardiovascular Disease

## 2021-11-12 ENCOUNTER — Telehealth: Payer: Self-pay | Admitting: Pharmacist

## 2021-11-12 DIAGNOSIS — E119 Type 2 diabetes mellitus without complications: Secondary | ICD-10-CM | POA: Diagnosis not present

## 2021-11-12 DIAGNOSIS — Z794 Long term (current) use of insulin: Secondary | ICD-10-CM | POA: Diagnosis not present

## 2021-11-12 NOTE — Chronic Care Management (AMB) (Signed)
Chronic Care Management Pharmacy Assistant   Name: Regina [name]  MRN: [id number] DOB: June 19, 1941  Reason for Encounter: Medication Review / Medication Coordination Call   Conditions to be addressed/monitored: HTN   Recent office visits:  11/08/2021 [name] MD - LUQ abdominal pain and additional issue. No medication changes. Follow up if symptoms worsen or fail to improve.   Recent consult visits:  None  Hospital visits:  None  Medications: Outpatient Encounter Medications as of 11/12/2021  Medication Sig Note   Alcohol Swabs (B-D SINGLE USE SWABS REGULAR) PADS Use to test blood sugar up to 3 times daily    allopurinol (ZYLOPRIM) 100 MG tablet TAKE ONE TABLET BY MOUTH EVERY MORNING    atorvastatin (LIPITOR) 20 MG tablet Take 1 tablet (20 mg total) by mouth daily.    Blood Glucose Monitoring Suppl (ACCU-CHEK GUIDE) w/Device KIT 1 Device by Does not apply route 3 (three) times daily.    Capsaicin 0.033 % CREA Apply 1 application topically 3 (three) times daily.    Cholecalciferol (VITAMIN D3) 50 MCG (2000 UT) capsule Take 2,000 Units by mouth daily.    COMFORT EZ PEN NEEDLES 31G X 8 MM MISC USE 3 TO 4 times daily as directed    Continuous Blood Gluc Sensor (DEXCOM G6 SENSOR) MISC 1 Device by Does not apply route as directed.    Continuous Blood Gluc Transmit (DEXCOM G6 TRANSMITTER) MISC 1 Device by Does not apply route as directed.    diclofenac Sodium (VOLTAREN) 1 % GEL Apply 1 application topically daily as needed (arthritis pain/hands).    DULoxetine (CYMBALTA) 60 MG capsule Take 1 capsule (60 mg total) by mouth daily.    famotidine (PEPCID) 20 MG tablet Take 1 tablet (20 mg total) by mouth at bedtime.    FARXIGA 10 MG TABS tablet Take 1 tablet (10 mg total) by mouth daily.    gabapentin (NEURONTIN) 600 MG tablet TAKE ONE TABLET BY MOUTH EVERY MORNING and TAKE ONE TABLET BY MOUTH EVERYDAY AT BEDTIME    glucose blood (ACCU-CHEK GUIDE) test strip Use to test blood  sugar up to 3 times daily    insulin glargine, 1 Unit Dial, (TOUJEO SOLOSTAR) 300 UNIT/ML Solostar Pen Inject 26 Units into the skin daily. Eat a snack with protein nightly before bedtime.    insulin lispro (HUMALOG KWIKPEN) 100 UNIT/ML KwikPen Max daily 15 units    Insulin Pen Needle 32G X 4 MM MISC 1 Device by Does not apply route in the morning, at noon, in the evening, and at bedtime.    ipratropium (ATROVENT) 0.06 % nasal spray Place 2 sprays into both nostrils 4 (four) times daily.    ketorolac (ACULAR) 0.4 % SOLN Place 1 drop into the right eye 4 (four) times daily.    Lancets Misc. (ACCU-CHEK FASTCLIX LANCET) KIT Use to test blood sugar up to 3 times daily    levothyroxine (SYNTHROID) 150 MCG tablet TAKE ONE TABLET BY MOUTH BEFORE BREAKFAST    magnesium oxide (MAG-OX) 400 MG tablet TAKE ONE TABLET BY MOUTH EVERY MORNING    meclizine (ANTIVERT) 25 MG tablet Take 25 mg by mouth daily as needed for dizziness.  01/08/2021: prn   metoprolol succinate (TOPROL-XL) 25 MG 24 hr tablet Take 1 tablet (25 mg total) by mouth daily. Take with or immediately following a meal.    nitroGLYCERIN (NITROSTAT) 0.4 MG SL tablet Place 1 tablet (0.4 mg total) under the tongue every 5 (five) minutes as needed for  chest pain.    Polyethylene Glycol 3350 (MIRALAX PO) Take by mouth daily as needed.    prednisoLONE acetate (PRED FORTE) 1 % ophthalmic suspension Place 1 drop into the right eye 4 (four) times daily.    predniSONE (DELTASONE) 1 MG tablet Take 4 tablets (4 mg total) by mouth daily with breakfast.    predniSONE (DELTASONE) 5 MG tablet 2 tabs qam x 2weeks, then 1 tab qam    Propylene Glycol (SYSTANE BALANCE OP) Place 1 drop into both eyes daily as needed (for dry eyes).    Tafamidis 61 MG CAPS Take 61 mg by mouth daily.    tiZANidine (ZANAFLEX) 4 MG tablet Take 0.5-1 tablets (2-4 mg total) by mouth at bedtime as needed for muscle spasms.    tobramycin (TOBREX) 0.3 % ophthalmic solution Place 1 drop into the  right eye 4 (four) times daily.    torsemide (DEMADEX) 20 MG tablet TAKE TWO TABLETS BY MOUTH EVERY MORNING Ok to take extra dose if weight gain of 1lb in 1 day or 5 lbs in 7 days    trolamine salicylate (ASPERCREME) 10 % cream Apply 1 application topically as needed for muscle pain.    warfarin (COUMADIN) 5 MG tablet TAKE 1/2 TO 1 TABLET BY MOUTH daily OR as directed by THE coumadin clinic    No facility-administered encounter medications on file as of 11/12/2021.   Reviewed chart for medication changes ahead of medication coordination call.  No OVs, Consults, or hospital visits since last care coordination call/Pharmacist visit. (If appropriate, list visit date, provider name)  No medication changes indicated OR if recent visit, treatment plan here.  BP Readings from Last 3 Encounters:  11/08/21 128/80  10/12/21 130/80  09/01/21 135/84    Lab Results  Component Value Date   HGBA1C 7.6 (A) 08/13/2021     Patient obtains medications through Adherence Packaging  30 Days    Last adherence delivery included: Pen needles 8 mm x 31 g x 5/16 (patient prefers these for both Toujeo and Humalog) Gabapentin 600 mg tablet  1 tablet at breakfast and 1 tablet at dinner Metoprolol succinate 25 mg tablets 1 tablet at breakfast Allopurinol 100 mg 1 tablet at breakfast Atorvastatin 20 mg 1 tablet at bedtime Cetirizine 10 mg 1 tablet at breakfast Levothyroxine 150 mcg 1 tablet before breakfast Farxiga 10 mg 1 tablet at breakfast Magnesium oxide 400 mg 1 tablet at breakfast Warfarin 5 mg tablet (easy top vials) Torsemide 20 mg 2 tablets at breakfast and as needed (extra in an easy top vial) ACCU CHEK test strips - use to check blood sugar three times daily. Humalog Kwikpen 100 un/ml - max daily 15 units Toujeo Solostar 300 un/ml - Inject 26 Units into the skin daily  Patient declined (meds) last month:     Patient is due for next adherence delivery on: 11/24/2021.   Called patient and  reviewed medications and coordinated delivery.   This delivery to include: Gabapentin 600 mg tablet  1 tablet at breakfast and 1 tablet at dinner Metoprolol succinate 25 mg tablets 1 tablet at breakfast Allopurinol 100 mg 1 tablet at breakfast Atorvastatin 20 mg 1 tablet at bedtime Cetirizine 10 mg 1 tablet at breakfast Levothyroxine 150 mcg 1 tablet before breakfast Farxiga 10 mg 1 tablet at breakfast Magnesium oxide 400 mg 1 tablet at breakfast Warfarin 5 mg tablet (easy top vials) Torsemide 20 mg 2 tablets at breakfast and as needed (extra in an easy top vial)  Patient will need a short fill:No short fills needed   Coordinated acute fill:No acute fills needed   Patient declined the following medications:  ACCU CHEK test strips - use to check blood sugar three times daily. Patient has plenty on hand. Pen needles 8 mm x 31 g x 5/16. Patient has plenty on hand.  Humalog Kwikpen 100 un/ml - max daily 15 units Toujeo Solostar 300 un/ml - Inject 26 Units into the skin daily Patient gets Humalog and Toujeo through mail order and has plenty on hand.     Confirmed delivery date of 11/24/2021, advised patient that pharmacy will contact them the morning of delivery.   Care Gaps: AWV - scheduled for 02/16/22 Last A1C - 7.6 on 08/13/2021 Last BP - 128/80 on 11/08/2021 Zoster vaccines - due [place] vaccine - overdue Foot exam - overdue Covid-19 vaccine - overdue   Star Rating Drugs: Atorvastatin 32m - last filled on 10/20/2021 [email or web address]- [facility]20/2021 [phone]

## 2021-11-18 ENCOUNTER — Other Ambulatory Visit: Payer: Medicare Other

## 2021-11-18 ENCOUNTER — Other Ambulatory Visit: Payer: Self-pay

## 2021-11-18 ENCOUNTER — Ambulatory Visit
Admission: RE | Admit: 2021-11-18 | Discharge: 2021-11-18 | Disposition: A | Discharge status: Home / Self Care | Payer: Medicare Other | Source: Ambulatory Visit | Attending: Family Medicine | Admitting: Family Medicine

## 2021-11-18 DIAGNOSIS — N200 Calculus of kidney: Secondary | ICD-10-CM | POA: Diagnosis not present

## 2021-11-18 DIAGNOSIS — K8020 Calculus of gallbladder without cholecystitis without obstruction: Secondary | ICD-10-CM | POA: Diagnosis not present

## 2021-11-18 DIAGNOSIS — R140 Abdominal distension (gaseous): Secondary | ICD-10-CM | POA: Diagnosis not present

## 2021-11-18 DIAGNOSIS — N281 Cyst of kidney, acquired: Secondary | ICD-10-CM | POA: Diagnosis not present

## 2021-11-18 DIAGNOSIS — R1012 Left upper quadrant pain: Secondary | ICD-10-CM

## 2021-11-21 NOTE — Progress Notes (Signed)
Cardiology Office Note:   Date:  11/23/2021  NAME:  Regina [name]    MRN: [id number] DOB:  [birth year]   PCP:  [name], [name], MD  Cardiologist:  Evalina Field, MD   Referring MD: [name], [name], MD   Chief Complaint  Patient presents with   Follow-up        History of Present Illness:   Regina [name] is a 81 y.o. female with a hx of persistent Afib, LAA thrombus, hATTR cardiac amyloidosis/HFpEF, DM who presents for follow-up.  Recently evaluated for left upper quadrant pain.  Recent abdominal ultrasound shows kidney stones that are nonobstructing.  She also has a fatty liver.  No evidence of ascites.  She also had a right renal cyst.  She reports the pain is gone away.  Describes sharp pain worse with deep inspiration.  Volume status is acceptable today.  Vitals are very stable.  BP well controlled.  She reports she is short of breath.  She is taking 40 mg of torsemide daily.  She has not exercising much.  I believe inactivity is a bigger issue.  She does not think she is getting to the gym.  She lives with her son who works full-time.  I do have issues finding transportation.  INR has been stable.  No bleeding issues.  Cholesterol well controlled.  Diabetes controlled.  Kidney function seems to be stable.  She is on her parameters.  Had to reinitiate her financial assistance.  Overall she looks quite euvolemic on exam.  She is without any chest pain symptoms.  Still getting short of breath.  Problem List 1.  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 Persistent Afib -LAA slugde vs thrombus 05/05/2020  -persistent sludge 07/01/2020 -LAA thrombus persistent 08/05/2020 -on warfarin 3. DM -A1c 7.6 4. HLD -T chol 115, HDL 54, LDL 40, triglycerides 101 5. HTN 6. Non-obstructive CAD -LHC 01/19/2015 [facility] -50% mid RCA and 50%  LAD -MPI 08/14/2020 -> normal 7. OSA 8. CKD 3/4 9. CVA -11/2020 @ [facility]  10. Polymyalgia Rheumatica   Past Medical History: Past Medical History:  Diagnosis Date   Back pain    CHF (congestive heart failure) (HCC)    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 01/2015 Echo: EF 55-60%, Gr 2 DD, mod LVH, mildly dil LA.   Heart disease    Hypertension    Hypothyroidism    Joint pain    Kidney problem    Lower extremity edema    Mini stroke    Non-obstructive CAD    a. 01/2015 Cardiolite: + inf wall ischemia, EF 56%;  b. 01/2015 Cath: LM nl, LAD 48m LCX min irregs, RCA dominant, 50-68m  Sleep apnea    Swallowing difficulty    Thyroid disease     Past Surgical History: Past Surgical History:  Procedure Laterality Date   ABDOMINAL HYSTERECTOMY     BUBBLE STUDY  07/01/2020   Procedure: BUBBLE STUDY;  Surgeon: AcElouise MunroeMD;  [place] Service: Cardiovascular;;   BUBBLE STUDY  08/05/2020   Procedure: BUBBLE STUDY;  Surgeon: Geralynn Rile, MD;  Location: [place];  Service: Cardiovascular;;  done with definity    CARDIAC CATHETERIZATION     ESOPHAGOGASTRODUODENOSCOPY     a. 01/2015 EGD: patent esophagus.   ESOPHAGOGASTRODUODENOSCOPY N/A 01/20/2015   Procedure: ESOPHAGOGASTRODUODENOSCOPY (EGD);  Surgeon: Carol Ada, MD;  Location: [facility] ENDOSCOPY;  Service: Endoscopy;  Laterality: N/A;   HAND SURGERY     JOINT REPLACEMENT     reports history bilateral TKA and right TSA   LEFT HEART CATHETERIZATION WITH CORONARY ANGIOGRAM N/A 01/19/2015   TEE WITHOUT CARDIOVERSION  05/04/2020   TEE WITHOUT CARDIOVERSION N/A 05/05/2020   Procedure: TRANSESOPHAGEAL ECHOCARDIOGRAM (TEE);  Surgeon: Pixie Casino, MD;  Location: [facility] ENDOSCOPY;  Service:  Cardiovascular;  Laterality: N/A;   TEE WITHOUT CARDIOVERSION N/A 07/01/2020   Procedure: TRANSESOPHAGEAL ECHOCARDIOGRAM (TEE);  Surgeon: Elouise Munroe, MD;  Location: [place];  Service: Cardiovascular;  Laterality: N/A;   TEE WITHOUT CARDIOVERSION N/A 08/05/2020   Procedure: TRANSESOPHAGEAL ECHOCARDIOGRAM (TEE);  Surgeon: Geralynn Rile, MD;  Location: [place];  Service: Cardiovascular;  Laterality: N/A;   THYROIDECTOMY      Current Medications: Current Meds  Medication Sig   Alcohol Swabs (B-D SINGLE USE SWABS REGULAR) PADS Use to test blood sugar up to 3 times daily   allopurinol (ZYLOPRIM) 100 MG tablet TAKE ONE TABLET BY MOUTH EVERY MORNING   atorvastatin (LIPITOR) 20 MG tablet Take 1 tablet (20 mg total) by mouth daily.   Cholecalciferol (VITAMIN D3) 50 MCG (2000 UT) capsule Take 2,000 Units by mouth daily.   COMFORT EZ PEN NEEDLES 31G X 8 MM MISC USE 3 TO 4 times daily as directed   Continuous Blood Gluc Sensor (DEXCOM G6 SENSOR) MISC 1 Device by Does not apply route as directed.   Continuous Blood Gluc Transmit (DEXCOM G6 TRANSMITTER) MISC 1 Device by Does not apply route as directed.   famotidine (PEPCID) 20 MG tablet Take 1 tablet (20 mg total) by mouth at bedtime.   FARXIGA 10 MG TABS tablet Take 1 tablet (10 mg total) by mouth daily.   gabapentin (NEURONTIN) 600 MG tablet TAKE ONE TABLET BY MOUTH EVERY MORNING and TAKE ONE TABLET BY MOUTH EVERYDAY AT BEDTIME   insulin glargine, 1 Unit Dial, (TOUJEO SOLOSTAR) 300 UNIT/ML Solostar Pen Inject 26 Units into the skin daily. Eat a snack with protein nightly before bedtime.   insulin lispro (HUMALOG KWIKPEN) 100 UNIT/ML KwikPen Max daily 15 units   Insulin Pen Needle 32G X 4 MM MISC 1 Device by Does not apply route in the morning, at noon, in the evening, and at bedtime.   Lancets Misc. (ACCU-CHEK FASTCLIX LANCET) KIT Use to test blood sugar up to 3 times daily   levothyroxine (SYNTHROID) 150 MCG tablet TAKE ONE  TABLET BY MOUTH BEFORE BREAKFAST   magnesium oxide (MAG-OX) 400 MG tablet TAKE ONE TABLET BY MOUTH EVERY MORNING   metoprolol succinate (TOPROL-XL) 25 MG 24 hr tablet Take 1 tablet (25 mg total) by mouth daily. Take with or immediately following a meal.   Propylene Glycol (SYSTANE BALANCE OP) Place 1 drop into both eyes daily as needed (for dry eyes).   Tafamidis 61 MG CAPS Take 61 mg by mouth daily.   torsemide (DEMADEX) 20 MG tablet TAKE TWO TABLETS BY MOUTH EVERY MORNING Ok to take extra dose if weight gain of 1lb in 1 day or 5 lbs in 7 days   trolamine salicylate (ASPERCREME) 10 % cream Apply 1 application topically as  needed for muscle pain.   warfarin (COUMADIN) 5 MG tablet TAKE 1/2 TO 1 TABLET BY MOUTH daily OR as directed by THE coumadin clinic     Allergies:    Ace inhibitors, Amlodipine, Atenolol, Avandia [rosiglitazone], Darvon [propoxyphene], Erythromycin, Hydralazine, Hydrocodone, Levofloxacin, Morphine and related, Percocet [oxycodone-acetaminophen], Spironolactone, and Tramadol   Social History: Social History   Socioeconomic History   Marital status: Widowed    Spouse name: Not on file   Number of children: Not on file   Years of education: Not on file   Highest education level: Not on file  Occupational History   Occupation: Retired  Tobacco Use   Smoking status: Never   Smokeless tobacco: Never  Vaping Use   Vaping Use: Never used  Substance and Sexual Activity   Alcohol use: No   Drug use: Not on file   Sexual activity: Not Currently  Other Topics Concern   Not on file  Social History Narrative   Lives with daughter   Social Determinants of Health   Financial Resource Strain: Low Risk    Difficulty of Paying Living Expenses: Not hard at all  Food Insecurity: No Food Insecurity   Worried About Charity fundraiser in the Last Year: Never true   Arboriculturist in the Last Year: Never true  Transportation Needs: No Transportation Needs   Lack of  Transportation (Medical): No   Lack of Transportation (Non-Medical): No  Physical Activity: Inactive   Days of Exercise per Week: 0 days   Minutes of Exercise per Session: 0 min  Stress: No Stress Concern Present   Feeling of Stress : Not at all  Social Connections: Moderately Isolated   Frequency of Communication with Friends and Family: More than three times a week   Frequency of Social Gatherings with Friends and Family: Twice a week   Attends Religious Services: More than 4 times per year   Active Member of [employer] or Organizations: No   Attends Archivist Meetings: Never   Marital Status: Widowed     Family History: The patient's family history includes Alcoholism in her father; Cancer in her father; Heart disease in her mother; Hypertension in her mother.  ROS:   All other ROS reviewed and negative. Pertinent positives noted in the HPI.     EKGs/Labs/Other Studies Reviewed:   The following studies were personally reviewed by me today:  TTE 04/21/2021  1. Left ventricular ejection fraction, by estimation, is 45 to 50%. The  left ventricle has mildly decreased function. The left ventricle  demonstrates global hypokinesis. There is moderate concentric left  ventricular hypertrophy. Left ventricular  diastolic function could not be evaluated.   2. Right ventricular systolic function is moderately reduced. The right  ventricular size is normal. Tricuspid regurgitation signal is inadequate  for assessing PA pressure.   3. Left atrial size was moderately dilated.   4. The mitral valve is grossly normal. Mild mitral valve regurgitation.  No evidence of mitral stenosis.   5. The aortic valve is tricuspid. Aortic valve regurgitation is not  visualized. No aortic stenosis is present.   6. The inferior vena cava is normal in size with greater than 50%  respiratory variability, suggesting right atrial pressure of 3 mmHg.   Recent Labs: 02/15/2021: Magnesium 2.2 04/02/2021:  Hemoglobin 12.0; Platelets 249 06/09/2021: TSH 0.65 10/29/2021: BUN 34; Creatinine, Ser 1.98; Potassium 4.3; Sodium 143   Recent Lipid Panel    Component Value Date/Time  CHOL 115 08/13/2021 1501   CHOL 117 03/24/2020 1428   TRIG 101.0 08/13/2021 1501   HDL 54.80 08/13/2021 1501   HDL 56 03/24/2020 1428   CHOLHDL 2 08/13/2021 1501   VLDL 20.2 08/13/2021 1501   LDLCALC 40 08/13/2021 1501   LDLCALC 49 03/24/2020 1428    Physical Exam:   VS:  BP 118/62 (BP Location: Left Arm, Patient Position: Sitting, Cuff Size: Large)    Pulse 97    Ht 5' 2"  (1.575 m)    Wt 229 lb 6.4 oz (104.1 kg)    SpO2 99%    BMI 41.96 kg/m    Wt Readings from Last 3 Encounters:  11/23/21 229 lb 6.4 oz (104.1 kg)  11/08/21 226 lb (102.5 kg)  10/12/21 228 lb (103.4 kg)    General: Well nourished, well developed, in no acute distress Head: Atraumatic, normal size  Eyes: PEERLA, EOMI  Neck: Supple, no JVD Endocrine: No thryomegaly Cardiac: Normal S1, S2; irregular rhythm  Lungs: Clear to auscultation bilaterally, no wheezing, rhonchi or rales  Abd: Soft, nontender, no hepatomegaly  Ext: No edema, pulses 2+ Musculoskeletal: No deformities, BUE and BLE strength normal and equal Skin: Warm and dry, no rashes   Neuro: Alert and oriented to person, place, time, and situation, CNII-XII grossly intact, no focal deficits  Psych: Normal mood and affect   ASSESSMENT:   Regina [name] is a 81 y.o. female who presents for the following: 1. Chronic systolic heart failure ([place])   2. Hereditary cardiac amyloidosis (HCC)   3. Persistent atrial fibrillation ([place])   4. Acquired thrombophilia ([place])   5. Persistent left atrial appendage thrombus   6. Coronary artery disease involving native coronary artery of native heart with other form of angina pectoris ([place])     PLAN:   1. Chronic systolic heart failure ([place]) 2. Hereditary cardiac amyloidosis (HCC) -Known history of hereditary cardiac amyloidosis.  Genetically  positive.  She is currently on tafamidis 61 mg daily.  She is actually done well over the past 2 years.  She has had no recurrent hospital admissions.  She seems to be doing quite well.  Volume status is acceptable.  She remains in A-fib but has been unable to be cardioverted due to persistent left atrial appendage thrombus.  We have attempted 3 times to get her back in sinus rhythm.  We have settled on Coumadin.  I believe this is the best option.  She developed thrombus on Eliquis. -She appears euvolemic to me.  I would continue with her torsemide 40 mg daily.  I suspect most of her shortness of breath is related to physical inactivity. -Abdominal symptoms are not uncommon with cardiac amyloidosis.  I did counsel her on this today.  I suspect this was the etiology of her abdominal pain.  No identifiable pathology was really found on ultrasound. -For now we will just continue with a conservative approach.  She has done well.  Some of her weight gain is likely related to adiposity.  She is eating more.  She is not active.  I encouraged her to get. -Her ejection fraction was low in the setting of cardioversion attempt.  She was on propofol.  EF now 45-50%.  Seems to be doing well.  She is not tolerating most medications due to low blood pressure but is acceptable today. -Not a candidate for ACE/ARB/Arni/MRA given CKD stage IV.  3. Persistent atrial fibrillation ([place]) 4. Acquired thrombophilia ([place]) 5. Persistent left atrial appendage thrombus -  Persistent atrial fibrillation.  She has had persistent left atrial appendage thrombus despite 3 attempts at cardioversion.  She developed thrombus on Eliquis.  We placed her on Coumadin and she still had this.  She has had a stroke but is remained on Coumadin.  We will just continue with this.  Given her disease pathology of cardiac amyloidosis she will be very prone to thromboembolic events.  She has done well on Coumadin and we will continue this. -We did not  reattempted TEE/cardioversion due to persistent left atrial appendage thrombus despite anticoagulation.  She is done well and has not been in the hospital for over 2 years.  For now we will continue with a conservative approach.  She is on metoprolol succinate 25 mg daily for rate control.  Continue Coumadin.  6. Coronary artery disease involving native coronary artery of native heart with other form of angina pectoris (HCC) -Known history of nonobstructive CAD.  This was a left heart cath in 2016 in [place].  Stress test in 2021 was normal.  She has had intermittent chest pain symptoms which I suspect are more abdominal and related to her cardiac amyloidosis.  We continued with volume control.  No further symptoms in office.  Continue to monitor.  She is on statin therapy.  LDL cholesterol is at goal.  Disposition: Return in about 6 months (around 05/23/2022).  Medication Adjustments/Labs and Tests Ordered: Current medicines are reviewed at length with the patient today.  Concerns regarding medicines are outlined above.  No orders of the defined types were placed in this encounter.  No orders of the defined types were placed in this encounter.   Patient Instructions  Medication Instructions:  The current medical regimen is effective;  continue present plan and medications.  *If you need a refill on your cardiac medications before your next appointment, please call your pharmacy*   Follow-Up: At [facility], you and your health needs are our priority.  As part of our continuing mission to provide you with exceptional heart care, we have created designated Provider Care Teams.  These Care Teams include your primary Cardiologist (physician) and Advanced Practice Providers (APPs -  Physician Assistants and Nurse Practitioners) who all work together to provide you with the care you need, when you need it.  We recommend signing up for the patient portal called "MyChart".  Sign up  information is provided on this After Visit Summary.  MyChart is used to connect with patients for Virtual Visits (Telemedicine).  Patients are able to view lab/test results, encounter notes, upcoming appointments, etc.  Non-urgent messages can be sent to your provider as well.   To learn more about what you can do with MyChart, go to [email or web address].    Your next appointment:   6 month(s)  The format for your next appointment:   In Person  Provider:   Evalina Field, MD       Time Spent with Patient: I have spent a total of 35 minutes with patient reviewing hospital notes, telemetry, EKGs, labs and examining the patient as well as establishing an assessment and plan that was discussed with the patient.  > 50% of time was spent in direct patient care.  Signed, Addison Naegeli. Audie Box, MD, [place]  [address], [place], [place] [address] [phone]  11/23/2021 9:43 AM

## 2021-11-23 ENCOUNTER — Other Ambulatory Visit: Payer: Self-pay

## 2021-11-23 ENCOUNTER — Ambulatory Visit (INDEPENDENT_AMBULATORY_CARE_PROVIDER_SITE_OTHER): Payer: Medicare Other | Admitting: *Deleted

## 2021-11-23 ENCOUNTER — Ambulatory Visit: Payer: Medicare Other | Admitting: Cardiovascular Disease

## 2021-11-23 ENCOUNTER — Encounter: Payer: Self-pay | Admitting: Cardiovascular Disease

## 2021-11-23 VITALS — BP 118/62 | HR 97 | Ht 62.0 in | Wt 229.4 lb

## 2021-11-23 DIAGNOSIS — I5022 Chronic systolic (congestive) heart failure: Secondary | ICD-10-CM | POA: Diagnosis not present

## 2021-11-23 DIAGNOSIS — I4819 Other persistent atrial fibrillation: Secondary | ICD-10-CM | POA: Diagnosis not present

## 2021-11-23 DIAGNOSIS — D6869 Other thrombophilia: Secondary | ICD-10-CM

## 2021-11-23 DIAGNOSIS — Z7901 Long term (current) use of anticoagulants: Secondary | ICD-10-CM | POA: Diagnosis not present

## 2021-11-23 DIAGNOSIS — I513 Intracardiac thrombosis, not elsewhere classified: Secondary | ICD-10-CM

## 2021-11-23 DIAGNOSIS — I43 Cardiomyopathy in diseases classified elsewhere: Secondary | ICD-10-CM | POA: Diagnosis not present

## 2021-11-23 DIAGNOSIS — E854 Organ-limited amyloidosis: Secondary | ICD-10-CM

## 2021-11-23 DIAGNOSIS — I25118 Atherosclerotic heart disease of native coronary artery with other forms of angina pectoris: Secondary | ICD-10-CM

## 2021-11-23 DIAGNOSIS — Z5181 Encounter for therapeutic drug level monitoring: Secondary | ICD-10-CM

## 2021-11-23 LAB — POCT INR: INR: 2.6 (ref 2.0–3.0)

## 2021-11-23 NOTE — Patient Instructions (Signed)
Medication Instructions:  ?The current medical regimen is effective;  continue present plan and medications. ? ?*If you need a refill on your cardiac medications before your next appointment, please call your pharmacy* ? ? ?Follow-Up: ?At [facility], you and your health needs are our priority.  As part of our continuing mission to provide you with exceptional heart care, we have created designated Provider Care Teams.  These Care Teams include your primary Cardiologist (physician) and Advanced Practice Providers (APPs -  Physician Assistants and Nurse Practitioners) who all work together to provide you with the care you need, when you need it. ? ?We recommend signing up for the patient portal called "MyChart".  Sign up information is provided on this After Visit Summary.  MyChart is used to connect with patients for Virtual Visits (Telemedicine).  Patients are able to view lab/test results, encounter notes, upcoming appointments, etc.  Non-urgent messages can be sent to your provider as well.   ?To learn more about what you can do with MyChart, go to [email or web address].   ? ?Your next appointment:   ?6 month(s) ? ?The format for your next appointment:   ?In Person ? ?Provider:   ?Dixie T O'Neal, MD { ? ? ? ? ? ? ? ?

## 2021-11-23 NOTE — Patient Instructions (Signed)
Description    Continue taking 1/2 tablet daily except 1 tablet each Sunday.  Repeat INR in 6 weeks; [id number];

## 2021-11-25 ENCOUNTER — Ambulatory Visit: Payer: Medicare Other | Admitting: Neurology

## 2021-12-01 ENCOUNTER — Telehealth: Payer: Self-pay | Admitting: Family Medicine

## 2021-12-01 NOTE — Telephone Encounter (Signed)
Patient called in stating that she's been blowing her nose too much due to a cold. Patient is requesting for something to be sent in to help her.  Patient could be contacted at [phone].  Please advise.

## 2021-12-01 NOTE — Telephone Encounter (Signed)
Patient will need an appointment...can be virtual if that is easier for her.

## 2021-12-02 ENCOUNTER — Telehealth (INDEPENDENT_AMBULATORY_CARE_PROVIDER_SITE_OTHER): Payer: Medicare Other | Admitting: Family Medicine

## 2021-12-02 ENCOUNTER — Encounter: Payer: Self-pay | Admitting: Family Medicine

## 2021-12-02 DIAGNOSIS — U071 COVID-19: Secondary | ICD-10-CM

## 2021-12-02 MED ORDER — MOLNUPIRAVIR EUA 200MG CAPSULE: 4.0000 | ORAL_CAPSULE | Freq: Two times a day (BID) | ORAL | 0 refills | Status: AC

## 2021-12-02 NOTE — Patient Instructions (Addendum)
HOME CARE TIPS:  -I sent the medication(s) we discussed to your pharmacy: Meds ordered this encounter  Medications   molnupiravir EUA (LAGEVRIO) 200 mg CAPS capsule    Sig: Take 4 capsules (800 mg total) by mouth 2 (two) times daily for 5 days.    Dispense:  40 capsule    Refill:  0     -I sent in the [place] treatment or referral you requested per our discussion. Please see the information provided below and discuss further with the pharmacist/treatment team.   -there is a chance of rebound illness after finishing your treatment. If you become sick again please isolate for an additional 5 days, plus 5 more days of masking.   -can use tylenol if needed for fevers, aches and pains per instructions  -can use nasal saline a few times per day if you have nasal congestion  -stay hydrated, drink plenty of fluids and eat small healthy meals - avoid dairy  -If the Covid test is positive, check out the [facility] website for more information on home care, transmission and treatment for COVID19  -follow up with your doctor in 2-3 days unless improving and feeling better  -stay home while sick, except to seek medical care. If you have COVID19, you will likely be contagious for 7-10 days. Flu or Influenza is likely contagious for about 7 days. Other respiratory viral infections remain contagious for 5-10+ days depending on the virus and many other factors. Wear a good mask that fits snugly (such as N95 or KN95) if around others to reduce the risk of transmission.  It was nice to meet you today, and I really hope you are feeling better soon. I help [place] out with telemedicine visits on Tuesdays and Thursdays and am happy to help if you need a follow up virtual visit on those days. Otherwise, if you have any concerns or questions following this visit please schedule a follow up visit with your Primary Care doctor or seek care at a local urgent care clinic to avoid delays in care.    Seek in person  care or schedule a follow up video visit promptly if your symptoms worsen, new concerns arise or you are not improving with treatment. Call 911 and/or seek emergency care if your symptoms are severe or life threatening.    Fact Sheet for Patients And Caregivers Emergency Use Authorization (EUA) Of LAGEVRIO (molnupiravir) capsules For Coronavirus Disease 2019 (COVID-19)  What is the most important information I should know about LAGEVRIO? LAGEVRIO may cause serious side effects, including: ? LAGEVRIO may cause harm to your unborn baby. It is not known if LAGEVRIO will harm your baby if you take LAGEVRIO during pregnancy. o LAGEVRIO is not recommended for use in pregnancy. o LAGEVRIO has not been studied in pregnancy. LAGEVRIO was studied in pregnant animals only. When LAGEVRIO was given to pregnant animals, LAGEVRIO caused harm to their unborn babies. o You and your healthcare provider may decide that you should take LAGEVRIO during pregnancy if there are no other COVID-19 treatment options approved or authorized by the FDA that are accessible or clinically appropriate for you. o If you and your healthcare provider decide that you should take LAGEVRIO during pregnancy, you and your healthcare provider should discuss the known and potential benefits and the potential risks of taking LAGEVRIO during pregnancy. For individuals who are able to become pregnant: ? You should use a reliable method of birth control (contraception) consistently and correctly during treatment with LAGEVRIO and  for 4 days after the last dose of LAGEVRIO. Talk to your healthcare provider about reliable birth control methods. ? Before starting treatment with [facility] your healthcare provider may do a pregnancy test to see if you are pregnant before starting treatment with LAGEVRIO. ? Tell your healthcare provider right away if you become pregnant or think you may be pregnant during treatment with LAGEVRIO. Pregnancy  Surveillance Program: ? There is a pregnancy surveillance program for individuals who take LAGEVRIO during pregnancy. The purpose of this program is to collect information about the health of you and your baby. Talk to your healthcare provider about how to take part in this program. ? If you take LAGEVRIO during pregnancy and you agree to participate in the pregnancy surveillance program and allow your healthcare provider to share your information with [place], then your healthcare provider will report your use of [place] during pregnancy to [place]. by calling [phone] or [email or web address]. For individuals who are sexually active with partners who are able to become pregnant: ? It is not known if LAGEVRIO can affect sperm. While the risk is regarded as low, animal studies to fully assess the potential for LAGEVRIO to affect the babies of males treated with LAGEVRIO have not been completed. A reliable method of birth control (contraception) should be used consistently and correctly during treatment with LAGEVRIO and for at least 3 months after the last dose. The risk to sperm beyond 3 months is not known. Studies to understand the risk to sperm beyond 3 months are ongoing. Talk to your healthcare provider about reliable birth control methods. Talk to your healthcare provider if you have questions or concerns about how LAGEVRIO may affect sperm. You are being given this fact sheet because your healthcare provider believes it is necessary to provide you with LAGEVRIO for the treatment of adults with mild-to-moderate coronavirus disease 2019 (COVID-19) with positive results of direct SARS-CoV-2 viral testing, and who are at high risk for progression to severe COVID-19 including hospitalization or death, and for whom other COVID-19 treatment options approved or authorized by the FDA are not accessible or clinically appropriate. The U.S. Food and Drug  Administration (FDA) has issued an Emergency Use Authorization (EUA) to make LAGEVRIO available during the COVID-19 pandemic (for more details about an EUA please see What is an Emergency Use Authorization? at the end of this document). LAGEVRIO is not an FDA-approved medicine in the [place]. Read this Fact Sheet for information about LAGEVRIO. Talk to your healthcare provider about your options if you have any questions. It is your choice to take LAGEVRIO.  What is COVID-19? COVID-19 is caused by a virus called a coronavirus. You can get COVID-19 through close contact with another person who has the virus. COVID-19 illnesses have ranged from very mild-to-severe, including illness resulting in death. While information so far suggests that most COVID-19 illness is mild, serious illness can happen and may cause some of your other medical conditions to become worse. Older people and people of all ages with severe, long lasting (chronic) medical conditions like heart disease, lung disease and diabetes, for example seem to be at higher risk of being hospitalized for COVID-19.  What is LAGEVRIO? LAGEVRIO is an investigational medicine used to treat mild-to-moderate COVID-19 in adults: ? with positive results of direct SARS-CoV-2 viral testing, and ? who are at high risk for progression to severe COVID-19 including hospitalization or death, and for whom other COVID-19 treatment options approved or  authorized by the FDA are not accessible or clinically appropriate. The FDA has authorized the emergency use of LAGEVRIO for the treatment of mild-tomoderate COVID-19 in adults under an EUA. For more information on EUA, see the What is an Emergency Use Authorization (EUA)? section at the end of this Fact Sheet. LAGEVRIO is not authorized: ? for use in people less than 78 years of age. ? for prevention of COVID-19. ? for people needing hospitalization for COVID-19. ? for use for longer than 5  consecutive days.  What should I tell my healthcare provider before I take LAGEVRIO? Tell your healthcare provider if you: ? Have any allergies ? Are breastfeeding or plan to breastfeed ? Have any serious illnesses ? Are taking any medicines (prescription, over-the-counter, vitamins, or herbal products).  How do I take LAGEVRIO? ? Take LAGEVRIO exactly as your healthcare provider tells you to take it. ? Take 4 capsules of LAGEVRIO every 12 hours (for example, at 8 am and at 8 pm) ? Take LAGEVRIO for 5 days. It is important that you complete the full 5 days of treatment with LAGEVRIO. Do not stop taking LAGEVRIO before you complete the full 5 days of treatment, even if you feel better. ? Take LAGEVRIO with or without food. ? You should stay in isolation for as long as your healthcare provider tells you to. Talk to your healthcare provider if you are not sure about how to properly isolate while you have COVID-19. ? Swallow LAGEVRIO capsules whole. Do not open, break, or crush the capsules. If you cannot swallow capsules whole, tell your healthcare provider. ? What to do if you miss a dose: o If it has been less than 10 hours since the missed dose, take it as soon as you remember o If it has been more than 10 hours since the missed dose, skip the missed dose and take your dose at the next scheduled time. ? Do not double the dose of LAGEVRIO to make up for a missed dose.  What are the important possible side effects of LAGEVRIO? ? See, What is the most important information I should know about LAGEVRIO? ? Allergic Reactions. Allergic reactions can happen in people taking LAGEVRIO, even after only 1 dose. Stop taking LAGEVRIO and call your healthcare provider right away if you get any of the following symptoms of an allergic reaction: o hives o rapid heartbeat o trouble swallowing or breathing o swelling of the mouth, lips, or face o throat tightness o hoarseness o skin rash The  most common side effects of LAGEVRIO are: ? diarrhea ? nausea ? dizziness These are not all the possible side effects of LAGEVRIO. Not many people have taken LAGEVRIO. Serious and unexpected side effects may happen. This medicine is still being studied, so it is possible that all of the risks are not known at this time.  What other treatment choices are there?  Veklury (remdesivir) is FDA-approved as an intravenous (IV) infusion for the treatment of mildto-moderate [id number] in certain adults and children. Talk with your doctor to see if Marijean Heath is appropriate for you. Like LAGEVRIO, FDA may also allow for the emergency use of other medicines to treat people with COVID-19. Go to [email or web address].[email or web address] for more information. It is your choice to be treated or not to be treated with LAGEVRIO. Should you decide not to take it, it will not change your standard medical care.  What if I am breastfeeding? Breastfeeding is not recommended during treatment with LAGEVRIO and for  4 days after the last dose of LAGEVRIO. If you are breastfeeding or plan to breastfeed, talk to your healthcare provider about your options and specific situation before taking LAGEVRIO.  How do I report side effects with LAGEVRIO? Contact your healthcare provider if you have any side effects that bother you or do not go away. Report side effects to FDA MedWatch at SmoothHits.hu or call 1-800-FDA-1088 (1- 276-339-5239).  How should I store [place]? ? Store LAGEVRIO capsules at room temperature between 67F to 59F (20C to 25C). ? Keep LAGEVRIO and all medicines out of the reach of children and pets. How can I learn more about COVID-19? ? Ask your healthcare provider. ? Visit SeekRooms.co.uk ? Contact your local or state public health department. ? Call [place] at [phone] (toll free in the  [place] ? Visit www.molnupiravir.com  What Is an Emergency Use Authorization (EUA)? The [place] FDA has made [place] available under an emergency access mechanism called an Emergency Use Authorization (EUA) The EUA is supported by a Presenter, broadcasting Health and Human Service (HHS) declaration that circumstances exist to justify emergency use of drugs and biological products during the COVID-19 pandemic. LAGEVRIO for the treatment of mild-to-moderate COVID-19 in adults with positive results of direct SARS-CoV-2 viral testing, who are at high risk for progression to severe COVID-19, including hospitalization or death, and for whom alternative COVID-19 treatment options approved or authorized by FDA are not accessible or clinically appropriate, has not undergone the same type of review as an FDA-approved product. In issuing an EUA under the KDTOI-71 public health emergency, the FDA has determined, among other things, that based on the total amount of scientific evidence available including data from adequate and well-controlled clinical trials, if available, it is reasonable to believe that the product may be effective for diagnosing, treating, or preventing COVID-19, or a serious or life-threatening disease or condition caused by COVID-19; that the known and potential benefits of the product, when used to diagnose, treat, or prevent such disease or condition, outweigh the known and potential risks of such product; and that there are no adequate, approved, and available alternatives.  All of these criteria must be met to allow for the product to be used in the treatment of patients during the COVID-19 pandemic. The EUA for LAGEVRIO is in effect for the duration of the COVID-19 declaration justifying emergency use of LAGEVRIO, unless terminated or revoked (after which LAGEVRIO may no longer be used under the EUA). For patent information: http://rogers.info/ Copyright  2021-2022 [place], [place] and its affiliates. All rights reserved. [id number] Revised: March 2022

## 2021-12-02 NOTE — Progress Notes (Signed)
Virtual Visit via Telephone Note  I connected with Regina [name] on 12/02/21 at 10:20 AM EST by telephone and verified that I am speaking with the correct person using two identifiers.   I discussed the limitations of performing an evaluation and management service by telephone and requested permission for a phone visit. The patient expressed understanding and agreed to proceed.  Location patient:  [place] Location provider: work or home office Participants present for the call: patient, provider Patient did not have a visit with me in the prior 7 days to address this/these issue(s).   History of Present Illness:  Acute telemedicine visit for Covid19: -Onset: 2-3 days ago; tested positive for covid yesterday -Symptoms include: nasal congestion, pnd, mild chills first day -Denies:fevers, CP, SOB, NVD -Pertinent past medical history: see below -eGFR 25 on recent check 1/23 -Pertinent medication allergies: Allergies  Allergen Reactions   Ace Inhibitors Other (See Comments)    unknown   Amlodipine Other (See Comments)    unknown   Atenolol Other (See Comments)    bradycardia   Avandia [Rosiglitazone] Other (See Comments)    unknown   Darvon [Propoxyphene] Other (See Comments)    unknown   Erythromycin Itching   Hydralazine Other (See Comments)    Burning in throat and chest   Hydrocodone Other (See Comments)    Hallucinations.   Levofloxacin Itching   Morphine And Related Other (See Comments)    Dizzy and hallucianation, vomiting; Willing to try low dose   Percocet [Oxycodone-Acetaminophen] Other (See Comments)    hallucination   Spironolactone Other (See Comments)    unknown   Tramadol Other (See Comments)    Unknown/does not recall reaction but does not want to take again  -COVID-19 vaccine status:  Immunization History  Administered Date(s) Administered   Fluad Quad(high Dose 65+) 06/21/2019   Influenza,inj,Quad PF,6+ Mos 01/17/2015   Influenza-Unspecified  01/17/2015, 10/25/2016   PFIZER(Purple Top)SARS-COV-2 Vaccination 11/06/2019, 11/27/2019, 08/12/2020, 10/23/2021   Pneumococcal Polysaccharide-23 05/01/2020   Zoster Recombinat (Shingrix) 10/23/2021    -COVID-19 vaccine status: Immunization History  Administered Date(s) Administered   Fluad Quad(high Dose 65+) 06/21/2019   Influenza,inj,Quad PF,6+ Mos 01/17/2015   Influenza-Unspecified 01/17/2015, 10/25/2016   PFIZER(Purple Top)SARS-COV-2 Vaccination 11/06/2019, 11/27/2019, 08/12/2020, 10/23/2021   Pneumococcal Polysaccharide-23 05/01/2020   Zoster Recombinat (Shingrix) 10/23/2021      Past Medical History:  Diagnosis Date   Back pain    CHF (congestive heart failure) (HCC)    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 01/2015 Echo: EF 55-60%, Gr 2 DD, mod LVH, mildly dil LA.   Heart disease    Hypertension    Hypothyroidism    Joint pain    Kidney problem    Lower extremity edema    Mini stroke    Non-obstructive CAD    a. 01/2015 Cardiolite: + inf wall ischemia, EF 56%;  b. 01/2015 Cath: LM nl, LAD 34m LCX min irregs, RCA dominant, 50-632m  Sleep apnea    Swallowing difficulty    Thyroid disease     Current Outpatient Medications on File Prior to Visit  Medication Sig Dispense Refill   Alcohol Swabs (B-D SINGLE USE SWABS REGULAR) PADS Use to test blood sugar up to 3 times daily 100 each 3  allopurinol (ZYLOPRIM) 100 MG tablet TAKE ONE TABLET BY MOUTH EVERY MORNING 90 tablet 1   atorvastatin (LIPITOR) 20 MG tablet Take 1 tablet (20 mg total) by mouth daily. 90 tablet 1   Blood Glucose Monitoring Suppl (ACCU-CHEK GUIDE) w/Device KIT 1 Device by Does not apply route 3 (three) times daily. 1 kit 0   Capsaicin 0.033 % CREA Apply 1 application topically 3 (three)  times daily. 56.6 g 11   Cholecalciferol (VITAMIN D3) 50 MCG (2000 UT) capsule Take 2,000 Units by mouth daily.     COMFORT EZ PEN NEEDLES 31G X 8 MM MISC USE 3 TO 4 times daily as directed 100 each 2   Continuous Blood Gluc Sensor (DEXCOM G6 SENSOR) MISC 1 Device by Does not apply route as directed. 9 each 3   Continuous Blood Gluc Transmit (DEXCOM G6 TRANSMITTER) MISC 1 Device by Does not apply route as directed. 1 each 3   diclofenac Sodium (VOLTAREN) 1 % GEL Apply 1 application topically daily as needed (arthritis pain/hands).     famotidine (PEPCID) 20 MG tablet Take 1 tablet (20 mg total) by mouth at bedtime. 90 tablet 1   FARXIGA 10 MG TABS tablet Take 1 tablet (10 mg total) by mouth daily. 90 tablet 3   gabapentin (NEURONTIN) 600 MG tablet TAKE ONE TABLET BY MOUTH EVERY MORNING and TAKE ONE TABLET BY MOUTH EVERYDAY AT BEDTIME 60 tablet 3   glucose blood (ACCU-CHEK GUIDE) test strip Use to test blood sugar up to 3 times daily 100 each 3   insulin glargine, 1 Unit Dial, (TOUJEO SOLOSTAR) 300 UNIT/ML Solostar Pen Inject 26 Units into the skin daily. Eat a snack with protein nightly before bedtime. 15 mL 6   insulin lispro (HUMALOG KWIKPEN) 100 UNIT/ML KwikPen Max daily 15 units (Patient taking differently: 5 Units 3 (three) times daily. Max daily 15 units) 15 mL 11   Insulin Pen Needle 32G X 4 MM MISC 1 Device by Does not apply route in the morning, at noon, in the evening, and at bedtime. 150 each 6   ipratropium (ATROVENT) 0.06 % nasal spray Place 2 sprays into both nostrils 4 (four) times daily. 15 mL 3   Lancets Misc. (ACCU-CHEK FASTCLIX LANCET) KIT Use to test blood sugar up to 3 times daily 1 kit 1   levothyroxine (SYNTHROID) 150 MCG tablet TAKE ONE TABLET BY MOUTH BEFORE BREAKFAST 90 tablet 1   magnesium oxide (MAG-OX) 400 MG tablet TAKE ONE TABLET BY MOUTH EVERY MORNING 90 tablet 1   meclizine (ANTIVERT) 25 MG tablet Take 25 mg by mouth daily as needed for dizziness.     metoprolol  succinate (TOPROL-XL) 25 MG 24 hr tablet Take 1 tablet (25 mg total) by mouth daily. Take with or immediately following a meal. 90 tablet 3   nitroGLYCERIN (NITROSTAT) 0.4 MG SL tablet Place 1 tablet (0.4 mg total) under the tongue every 5 (five) minutes as needed for chest pain. 25 tablet 2   Polyethylene Glycol 3350 (MIRALAX PO) Take by mouth daily as needed.     Propylene Glycol (SYSTANE BALANCE OP) Place 1 drop into both eyes daily as needed (for dry eyes).     Tafamidis 61 MG CAPS Take 61 mg by mouth daily. 30 capsule 12   tiZANidine (ZANAFLEX) 4 MG tablet Take 0.5-1 tablets (2-4 mg total) by mouth at bedtime as needed for muscle spasms. 30 tablet 0   torsemide (DEMADEX) 20 MG tablet TAKE TWO TABLETS BY MOUTH EVERY  MORNING Ok to take extra dose if weight gain of 1lb in 1 day or 5 lbs in 7 days 180 tablet 3   trolamine salicylate (ASPERCREME) 10 % cream Apply 1 application topically as needed for muscle pain.     warfarin (COUMADIN) 5 MG tablet TAKE 1/2 TO 1 TABLET BY MOUTH daily OR as directed by THE coumadin clinic 30 tablet 0   No current facility-administered medications on file prior to visit.    Observations/Objective: Patient sounds cheerful and well on the phone. I do not appreciate any SOB. Speech and thought processing are grossly intact. Patient reported vitals:  Assessment and Plan:  COVID-19   Discussed treatment options and risk of drug interactions, ideal treatment window, potential complications, isolation and precautions for COVID-19.  Discussed possibility of rebound with antivirals and the need to reisolate if it should occur for 5 days. Checked for/reviewed any labs done in the last 90 days with GFR listed in HPI if available. After lengthy discussion, the patient opted for treatment with Legevrio due to being higher risk for complications of covid or severe disease and other factors. Discussed EUA status of this drug and the fact that there is preliminary limited  knowledge of risks/interactions/side effects per EUA document vs possible benefits and precautions. This information was shared with patient during the visit and also was provided in patient instructions. Other symptomatic care measures summarized in patient instructions. Advised to seek prompt virtual visit or in person care if worsening, new symptoms arise, or if is not improving with treatment as expected per our conversation of expected course. Discussed options for follow up care. Did let this patient know that I do telemedicine on Tuesdays and Thursdays for [place] and those are the days I am logged into the system. Advised to schedule follow up visit with PCP, [place] virtual visits or [facility] if any further questions or concerns to avoid delays in care.   I discussed the assessment and treatment plan with the patient. The patient was provided an opportunity to ask questions and all were answered. The patient agreed with the plan and demonstrated an understanding of the instructions.    Follow Up Instructions:  I did not refer this patient for an OV with me in the next 24 hours for this/these issue(s).  I discussed the assessment and treatment plan with the patient. The patient was provided an opportunity to ask questions and all were answered. The patient agreed with the plan and demonstrated an understanding of the instructions.   I spent 16 minutes on the date of this visit in the care of this patient. See summary of tasks completed to properly care for this patient in the detailed notes above which also included counseling of above, review of PMH, medications, allergies, evaluation of the patient and ordering and/or  instructing patient on testing and care options.     Lucretia Kern, DO

## 2021-12-08 ENCOUNTER — Telehealth: Payer: Self-pay | Admitting: Pulmonary Disease

## 2021-12-08 NOTE — Telephone Encounter (Signed)
Patient last seen 02/2020. Appt scheduled 12/13/2021 at 11:30.  Patient stated that she has had issues with her nose burning after using cpap. She stated that this has been happening for a long time. She was recently dx with covid and has not worn cpap since being dx. She would like to discuss this further at her upcoming appt.  Nothing further needed at this time.

## 2021-12-10 ENCOUNTER — Telehealth: Payer: Self-pay

## 2021-12-10 NOTE — Telephone Encounter (Signed)
[facility] - Client °TELEPHONE ADVICE RECORD °AccessNurse® °Patient °Name: °Regina CAR °[name] °Gender: Female °DOB: 01/04/1941 °Age: 80 Y 2 M 6 D °Return °Phone °Number: °[phone] °(Primary) °Address: °City/ °State/ °Zip: °[place] ° [address] °Client [facility] - Client °Client Site  [facility] °Provider Jordan, Betty - MD °Contact Type Call °Who Is Calling Patient / Member / Family / Caregiver °Call Type Triage / Clinical °Relationship To Patient Self °Return Phone Number [phone] (Primary) °Chief Complaint Health information question (non symptomatic) °Reason for Call Symptomatic / Request for Health Information °Initial Comment Caller states she tested positive for covid 9 days °ago with symptoms of only a runny nose. Caller °states her doctor placed her on molnupiravir °200mg. Caller states she tested herself again today °and she is still positive. No Symptoms at this °time. °Translation No °Nurse Assessment °Nurse: Standifer, RN, Heather Date/Time (Eastern Time): 12/08/2021 11:43:08 AM °Confirm and document reason for call. If °symptomatic, describe symptoms. °---Caller states she tested positive for covid 9 days ago °with symptoms of only a runny nose. Caller states her °doctor placed her on molnupiravir 200mg. Caller states °she tested herself again today and she is still positive. °No Symptoms at this time. °Does the patient have any new or worsening °symptoms? ---No °Guidelines °Guideline Title Affirmed Question Affirmed Notes Nurse Date/Time (Eastern °Time) °COVID-19 - °Diagnosed or °Suspected °[1] COVID-19 °diagnosed by positive °lab test (e.g., PCR, °rapid self-test °kit) AND [2] NO °symptoms (e.g., °cough, fever, others) °Standifer, RN, °Heather °12/08/2021 11:43:30 °AM °Disp. Time (Eastern °Time) Disposition Final User °12/08/2021 11:50:24 AM Home Care Yes Standifer, RN, Heather °PLEASE NOTE: All timestamps contained  within this report are represented as Eastern Standard Time. °CONFIDENTIALTY NOTICE: This fax transmission is intended only for the addressee. It contains information that is legally privileged, confidential or °otherwise protected from use or disclosure. If you are not the intended recipient, you are strictly prohibited from reviewing, disclosing, copying using °or disseminating any of this information or taking any action in reliance on or regarding this information. If you have received this fax in error, please °notify us immediately by telephone so that we can arrange for its return to us. Phone: [phone], Toll-Free: [phone], Fax: [phone] °Page: 2 of 2 °Call Id: [id number] °Caller Disagree/Comply Comply °Caller Understands Yes °PreDisposition Call Doctor °Care Advice Given Per Guideline °HOME CARE: * You should be able to treat this at home. * WEAR A MASK FOR 10 DAYS: Wear a well-fitted mask for 10 °full days any time you are around others inside your home or in public. Do not go to places where you are unable to wear a mask. °COVID-19 - HOW TO PROTECT OTHERS - WHEN YOU TEST POSITIVE FOR COVID BUT HAVE NO SYMPTOMS: CALL °BACK IF: * You have more questions CARE ADVICE given per COVID-19 - DIAGNOSED OR SUSPECTED (Adult) guideline °

## 2021-12-10 NOTE — Telephone Encounter (Signed)
Home care advised.

## 2021-12-13 ENCOUNTER — Other Ambulatory Visit: Payer: Self-pay | Admitting: Family Medicine

## 2021-12-13 ENCOUNTER — Telehealth: Payer: Self-pay | Admitting: Cardiovascular Disease

## 2021-12-13 ENCOUNTER — Other Ambulatory Visit: Payer: Self-pay

## 2021-12-13 ENCOUNTER — Ambulatory Visit (INDEPENDENT_AMBULATORY_CARE_PROVIDER_SITE_OTHER): Payer: Medicare Other | Admitting: Adult Health

## 2021-12-13 ENCOUNTER — Other Ambulatory Visit: Payer: Self-pay | Admitting: Cardiovascular Disease

## 2021-12-13 ENCOUNTER — Encounter: Payer: Self-pay | Admitting: Adult Health

## 2021-12-13 ENCOUNTER — Telehealth: Payer: Self-pay | Admitting: Pharmacist

## 2021-12-13 VITALS — BP 122/60 | HR 104 | Temp 98.1°F | Ht 62.0 in | Wt 227.4 lb

## 2021-12-13 DIAGNOSIS — Z9989 Dependence on other enabling machines and devices: Secondary | ICD-10-CM | POA: Diagnosis not present

## 2021-12-13 DIAGNOSIS — G4733 Obstructive sleep apnea (adult) (pediatric): Secondary | ICD-10-CM

## 2021-12-13 DIAGNOSIS — R918 Other nonspecific abnormal finding of lung field: Secondary | ICD-10-CM | POA: Insufficient documentation

## 2021-12-13 NOTE — Telephone Encounter (Signed)
Spoke with pt, she is going to be flying to [place] to see her brother who is sick. She reports feeling well just tired after her recent covid infection. She is wanting to leave as soon as she gets the okay or not.

## 2021-12-13 NOTE — Chronic Care Management (AMB) (Signed)
Chronic Care Management Pharmacy Assistant   Name: Regina [name]  MRN: [id number] DOB: [birth year]  Reason for Encounter: Medication Review / Medication Coordination Call   Conditions to be addressed/monitored: HTN  Recent office visits:  None  Recent consult visits:  None  Hospital visits:  None  Medications: Outpatient Encounter Medications as of 12/13/2021  Medication Sig Note   Alcohol Swabs (B-D SINGLE USE SWABS REGULAR) PADS Use to test blood sugar up to 3 times daily    allopurinol (ZYLOPRIM) 100 MG tablet TAKE ONE TABLET BY MOUTH EVERY MORNING    atorvastatin (LIPITOR) 20 MG tablet TAKE ONE TABLET BY MOUTH EVERYDAY AT BEDTIME    Blood Glucose Monitoring Suppl (ACCU-CHEK GUIDE) w/Device KIT 1 Device by Does not apply route 3 (three) times daily.    Capsaicin 0.033 % CREA Apply 1 application topically 3 (three) times daily.    Cholecalciferol (VITAMIN D3) 50 MCG (2000 UT) capsule Take 2,000 Units by mouth daily.    COMFORT EZ PEN NEEDLES 31G X 8 MM MISC USE 3 TO 4 times daily as directed    Continuous Blood Gluc Sensor (DEXCOM G6 SENSOR) MISC 1 Device by Does not apply route as directed.    Continuous Blood Gluc Transmit (DEXCOM G6 TRANSMITTER) MISC 1 Device by Does not apply route as directed.    diclofenac Sodium (VOLTAREN) 1 % GEL Apply 1 application topically daily as needed (arthritis pain/hands).    famotidine (PEPCID) 20 MG tablet Take 1 tablet (20 mg total) by mouth at bedtime.    FARXIGA 10 MG TABS tablet Take 1 tablet (10 mg total) by mouth daily.    gabapentin (NEURONTIN) 600 MG tablet TAKE ONE TABLET BY MOUTH EVERY MORNING and TAKE ONE TABLET BY MOUTH EVERYDAY AT BEDTIME    glucose blood (ACCU-CHEK GUIDE) test strip Use to test blood sugar up to 3 times daily    insulin glargine, 1 Unit Dial, (TOUJEO SOLOSTAR) 300 UNIT/ML Solostar Pen Inject 26 Units into the skin daily. Eat a snack with protein nightly before bedtime.    insulin lispro (HUMALOG KWIKPEN)  100 UNIT/ML KwikPen Max daily 15 units (Patient taking differently: 5 Units 3 (three) times daily. Max daily 15 units)    Insulin Pen Needle 32G X 4 MM MISC 1 Device by Does not apply route in the morning, at noon, in the evening, and at bedtime.    ipratropium (ATROVENT) 0.06 % nasal spray Place 2 sprays into both nostrils 4 (four) times daily.    Lancets Misc. (ACCU-CHEK FASTCLIX LANCET) KIT Use to test blood sugar up to 3 times daily    levothyroxine (SYNTHROID) 150 MCG tablet TAKE ONE TABLET BY MOUTH BEFORE BREAKFAST    magnesium oxide (MAG-OX) 400 MG tablet TAKE ONE TABLET BY MOUTH EVERY MORNING    meclizine (ANTIVERT) 25 MG tablet Take 25 mg by mouth daily as needed for dizziness. 01/08/2021: prn   metoprolol succinate (TOPROL-XL) 25 MG 24 hr tablet Take 1 tablet (25 mg total) by mouth daily. Take with or immediately following a meal.    nitroGLYCERIN (NITROSTAT) 0.4 MG SL tablet Place 1 tablet (0.4 mg total) under the tongue every 5 (five) minutes as needed for chest pain.    Polyethylene Glycol 3350 (MIRALAX PO) Take by mouth daily as needed.    Propylene Glycol (SYSTANE BALANCE OP) Place 1 drop into both eyes daily as needed (for dry eyes).    Tafamidis 61 MG CAPS Take 61 mg by mouth daily.  tiZANidine (ZANAFLEX) 4 MG tablet Take 0.5-1 tablets (2-4 mg total) by mouth at bedtime as needed for muscle spasms.    torsemide (DEMADEX) 20 MG tablet TAKE TWO TABLETS BY MOUTH EVERY MORNING Ok to take extra dose if weight gain of 1lb in 1 day or 5 lbs in 7 days    trolamine salicylate (ASPERCREME) 10 % cream Apply 1 application topically as needed for muscle pain.    warfarin (COUMADIN) 5 MG tablet TAKE 1/2 TO 1 TABLET BY MOUTH daily OR AS DIRECTED by THE coumadin clinic    No facility-administered encounter medications on file as of 12/13/2021.   Reviewed chart for medication changes ahead of medication coordination call.  No OVs, Consults, or hospital visits since last care coordination  call/Pharmacist visit. (If appropriate, list visit date, provider name)  No medication changes indicated OR if recent visit, treatment plan here.  BP Readings from Last 3 Encounters:  12/13/21 122/60  11/23/21 118/62  11/08/21 128/80    Lab Results  Component Value Date   HGBA1C 7.6 (A) 08/13/2021     Patient obtains medications through Adherence Packaging  30 Days    Last adherence delivery included: Gabapentin 600 mg tablet  1 tablet at breakfast and 1 tablet at dinner Metoprolol succinate 25 mg tablets 1 tablet at breakfast Allopurinol 100 mg 1 tablet at breakfast Atorvastatin 20 mg 1 tablet at bedtime Cetirizine 10 mg 1 tablet at breakfast Levothyroxine 150 mcg 1 tablet before breakfast Farxiga 10 mg 1 tablet at breakfast Magnesium oxide 400 mg 1 tablet at breakfast Warfarin 5 mg tablet (easy top vials) Torsemide 20 mg 2 tablets at breakfast and as needed (extra in an easy top vial)   Patient declined (meds) last month:   ACCU CHEK test strips - use to check blood sugar three times daily. Patient has plenty on hand. Pen needles 8 mm x 31 g x 5/16. Patient has plenty on hand.   Humalog Kwikpen 100 un/ml - max daily 15 units Toujeo Solostar 300 un/ml - Inject 26 Units into the skin daily Patient gets Humalog and Toujeo through mail order and has plenty on hand     Patient is due for next adherence delivery on: 12/23/2021.   Called patient and reviewed medications and coordinated delivery.   This delivery to include: Gabapentin 600 mg tablet  1 tablet at breakfast and 1 tablet at dinner Metoprolol succinate 25 mg tablets 1 tablet at breakfast Allopurinol 100 mg 1 tablet at breakfast Atorvastatin 20 mg 1 tablet at bedtime Cetirizine 10 mg 1 tablet at breakfast Levothyroxine 150 mcg 1 tablet before breakfast Farxiga 10 mg 1 tablet at breakfast Magnesium oxide 400 mg 1 tablet at breakfast Warfarin 5 mg tablet (easy top vials) use as directed Torsemide 20 mg 2 tablets  at breakfast and as needed  Pen needles 8 mm x 31 g x 5/16  Toujeo Solostar 300 un/ml - Inject 26 Units into the skin daily   Patient will need a short fill: No short fills needed   Coordinated acute fill: No acute fills needed   Patient declined the following medications:  Humalog Kwikpen 100 un/ml - max daily 15 units - patient has plenty on had left from mail order. ACCU CHEK test strips - use to check blood sugar 3 times daily - has plenty on hand   Confirmed delivery date of 12/23/2021, advised patient that pharmacy will contact them the morning of delivery.   Care Gaps: AWV - scheduled for  02/16/22 Last A1C - 7.6 on 08/13/2021 Last BP - 128/80 on 11/08/2021 Zoster vaccines - due [place] vaccine - overdue Foot exam - overdue Covid-19 vaccine - overdue    Star Rating Drugs: Atorvastatin 5m - last filled on 11/19/2021 [id number]- last filled on 11/19/2021 [phone]

## 2021-12-13 NOTE — Assessment & Plan Note (Signed)
Small pulmonary nodules being followed on serial CT.  Most recent CT chest November 2022 showed near resolution of a 6 right middle lobe nodule.  This is consistent with a benign etiology.  Some scattered 2 mm nodules remain.  At this time no further imaging is indicated.

## 2021-12-13 NOTE — Assessment & Plan Note (Signed)
Patient having difficulty tolerating CPAP. We will try to obtain her previous sleep study results. CPAP download was requested Add in saline nasal spray and saline nasal gel.  Plan  Patient Instructions  Wear CPAP all night long long  Try saline nasal spray Twice daily   Try saline nasal gel At bedtime   Try Dream wear nasal mask  Results from DME ([facility] in [place], [place] ) .  Follow up with Dr. Hermina Staggers in 3 months and As needed

## 2021-12-13 NOTE — Telephone Encounter (Signed)
Pt would like to know if it is safe for her to travel with her condition... please advise

## 2021-12-13 NOTE — Progress Notes (Signed)
@Patient  ID: Regina [name], female    DOB: 01/11/1941, 81 y.o.   MRN: [id number]  Chief Complaint  Patient presents with   Follow-up    Referring provider: [name], [name], MD  HPI: 81 year old female followed for obstructive sleep apnea and lung nodules  TEST/EVENTS :  CT chest August 20, 2021 showed previously seen 6 mm right middle lobe nodule is almost completely resolved with resolution of infection/inflammation.  Occasional noncalcified pulmonary nodules measuring no greater than 2 mm consistent with a benign etiology.  .12/13/2021 Follow up : OSA , Lung nodules  Patient presents for a follow-up visit.  She was last seen May 2021.  Patient has a known history of lung nodules on CT scan.  Patient's been followed serially for these.  Most recent CT chest August 20, 2021 showed right middle lobe nodule measuring 6 mm is almost totally resolved.  Consistent with a benign etiology.  Patient has a known history of obstructive sleep apnea.  Is on CPAP.  She says she tries to wear her CPAP but has a really difficult time.  She says it causes a lot of nasal issues especially nasal burning and is just not able to tolerate it more than an hour to she has had CPAP for greater than 4 years.  [facility] supplies [facility].  She currently is using nasal pillows.  Patient says she would like to get on her CPAP because she has residual daytime sleepiness.  Does not feel rested. We do not have any record of her previous sleep study   Allergies  Allergen Reactions   Ace Inhibitors Other (See Comments)    unknown   Amlodipine Other (See Comments)    unknown   Atenolol Other (See Comments)    bradycardia   Avandia [Rosiglitazone] Other (See Comments)    unknown   Darvon [Propoxyphene] Other (See Comments)    unknown   Erythromycin Itching   Hydralazine Other (See Comments)    Burning in throat and chest   Hydrocodone Other (See Comments)     Hallucinations.   Levofloxacin Itching   Morphine And Related Other (See Comments)    Dizzy and hallucianation, vomiting; Willing to try low dose   Percocet [Oxycodone-Acetaminophen] Other (See Comments)    hallucination   Spironolactone Other (See Comments)    unknown   Tramadol Other (See Comments)    Unknown/does not recall reaction but does not want to take again    Immunization History  Administered Date(s) Administered   Fluad Quad(high Dose 65+) 06/21/2019   Influenza,inj,Quad PF,6+ Mos 01/17/2015   Influenza-Unspecified 01/17/2015, 10/25/2016   PFIZER(Purple Top)SARS-COV-2 Vaccination 11/06/2019, 11/27/2019, 08/12/2020, 10/23/2021   Pneumococcal Polysaccharide-23 05/01/2020   Zoster Recombinat (Shingrix) 10/23/2021    Past Medical History:  Diagnosis Date   Back pain    CHF (congestive heart failure) (HCC)    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 01/2015 Echo: EF 55-60%, Gr 2 DD, mod LVH, mildly dil LA.   Heart disease    Hypertension    Hypothyroidism    Joint pain    Kidney problem    Lower extremity edema    Mini stroke    Non-obstructive CAD  a. 01/2015 Cardiolite: + inf wall ischemia, EF 56%;  b. 01/2015 Cath: LM nl, LAD 58m LCX min irregs, RCA dominant, 50-626m  Sleep apnea    Swallowing difficulty    Thyroid disease     Tobacco History: Social History   Tobacco Use  Smoking Status Never  Smokeless Tobacco Never   Counseling given: Not Answered   Outpatient Medications Prior to Visit  Medication Sig Dispense Refill   Alcohol Swabs (B-D SINGLE USE SWABS REGULAR) PADS Use to test blood sugar up to 3 times daily 100 each 3   allopurinol (ZYLOPRIM) 100 MG tablet TAKE ONE TABLET BY MOUTH EVERY MORNING 90 tablet 1   atorvastatin  (LIPITOR) 20 MG tablet TAKE ONE TABLET BY MOUTH EVERYDAY AT BEDTIME 90 tablet 2   Blood Glucose Monitoring Suppl (ACCU-CHEK GUIDE) w/Device KIT 1 Device by Does not apply route 3 (three) times daily. 1 kit 0   Capsaicin 0.033 % CREA Apply 1 application topically 3 (three) times daily. 56.6 g 11   Cholecalciferol (VITAMIN D3) 50 MCG (2000 UT) capsule Take 2,000 Units by mouth daily.     COMFORT EZ PEN NEEDLES 31G X 8 MM MISC USE 3 TO 4 times daily as directed 100 each 2   Continuous Blood Gluc Sensor (DEXCOM G6 SENSOR) MISC 1 Device by Does not apply route as directed. 9 each 3   Continuous Blood Gluc Transmit (DEXCOM G6 TRANSMITTER) MISC 1 Device by Does not apply route as directed. 1 each 3   diclofenac Sodium (VOLTAREN) 1 % GEL Apply 1 application topically daily as needed (arthritis pain/hands).     famotidine (PEPCID) 20 MG tablet Take 1 tablet (20 mg total) by mouth at bedtime. 90 tablet 1   FARXIGA 10 MG TABS tablet Take 1 tablet (10 mg total) by mouth daily. 90 tablet 3   gabapentin (NEURONTIN) 600 MG tablet TAKE ONE TABLET BY MOUTH EVERY MORNING and TAKE ONE TABLET BY MOUTH EVERYDAY AT BEDTIME 60 tablet 3   glucose blood (ACCU-CHEK GUIDE) test strip Use to test blood sugar up to 3 times daily 100 each 3   insulin glargine, 1 Unit Dial, (TOUJEO SOLOSTAR) 300 UNIT/ML Solostar Pen Inject 26 Units into the skin daily. Eat a snack with protein nightly before bedtime. 15 mL 6   insulin lispro (HUMALOG KWIKPEN) 100 UNIT/ML KwikPen Max daily 15 units (Patient taking differently: 5 Units 3 (three) times daily. Max daily 15 units) 15 mL 11   Insulin Pen Needle 32G X 4 MM MISC 1 Device by Does not apply route in the morning, at noon, in the evening, and at bedtime. 150 each 6   ipratropium (ATROVENT) 0.06 % nasal spray Place 2 sprays into both nostrils 4 (four) times daily. 15 mL 3   Lancets Misc. (ACCU-CHEK FASTCLIX LANCET) KIT Use to test blood sugar up to 3 times daily 1 kit 1   levothyroxine  (SYNTHROID) 150 MCG tablet TAKE ONE TABLET BY MOUTH BEFORE BREAKFAST 90 tablet 1   magnesium oxide (MAG-OX) 400 MG tablet TAKE ONE TABLET BY MOUTH EVERY MORNING 90 tablet 1   meclizine (ANTIVERT) 25 MG tablet Take 25 mg by mouth daily as needed for dizziness.     metoprolol succinate (TOPROL-XL) 25 MG 24 hr tablet Take 1 tablet (25 mg total) by mouth daily. Take with or immediately following a meal. 90 tablet 3   nitroGLYCERIN (NITROSTAT) 0.4 MG SL tablet Place 1 tablet (0.4 mg total) under the tongue every 5 (  five) minutes as needed for chest pain. 25 tablet 2   Polyethylene Glycol 3350 (MIRALAX PO) Take by mouth daily as needed.     Propylene Glycol (SYSTANE BALANCE OP) Place 1 drop into both eyes daily as needed (for dry eyes).     Tafamidis 61 MG CAPS Take 61 mg by mouth daily. 30 capsule 12   tiZANidine (ZANAFLEX) 4 MG tablet Take 0.5-1 tablets (2-4 mg total) by mouth at bedtime as needed for muscle spasms. 30 tablet 0   torsemide (DEMADEX) 20 MG tablet TAKE TWO TABLETS BY MOUTH EVERY MORNING Ok to take extra dose if weight gain of 1lb in 1 day or 5 lbs in 7 days 180 tablet 3   trolamine salicylate (ASPERCREME) 10 % cream Apply 1 application topically as needed for muscle pain.     warfarin (COUMADIN) 5 MG tablet TAKE 1/2 TO 1 TABLET BY MOUTH daily OR AS DIRECTED by THE coumadin clinic 30 tablet 0   No facility-administered medications prior to visit.     Review of Systems:   Constitutional:   No  weight loss, night sweats,  Fevers, chills,  +fatigue, or  lassitude.  HEENT:   No headaches,  Difficulty swallowing,  Tooth/dental problems, or  Sore throat,                No sneezing, itching, ear ache, nasal congestion, post nasal drip,   CV:  No chest pain,  Orthopnea, PND, swelling in lower extremities, anasarca, dizziness, palpitations, syncope.   GI  No heartburn, indigestion, abdominal pain, nausea, vomiting, diarrhea, change in bowel habits, loss of appetite, bloody stools.    Resp: No shortness of breath with exertion or at rest.  No excess mucus, no productive cough,  No non-productive cough,  No coughing up of blood.  No change in color of mucus.  No wheezing.  No chest wall deformity  Skin: no rash or lesions.  GU: no dysuria, change in color of urine, no urgency or frequency.  No flank pain, no hematuria   MS:  No joint pain or swelling.  No decreased range of motion.  No back pain.    Physical Exam  BP 122/60 (BP Location: Left Arm, Patient Position: Sitting, Cuff Size: Normal)    Pulse (!) 104    Temp 98.1 F (36.7 C) (Oral)    Ht 5' 2"  (1.575 m)    Wt 227 lb 6.4 oz (103.1 kg)    SpO2 97%    BMI 41.59 kg/m   GEN: A/Ox3; pleasant , NAD, well nourished    NOSE-clear, THROAT-clear, no lesions, no postnasal drip or exudate noted.   NECK:  Supple w/ fair ROM; no JVD; normal carotid impulses w/o bruits; no thyromegaly or nodules palpated; no lymphadenopathy.    RESP  Clear  P & A; w/o, wheezes/ rales/ or rhonchi. no accessory muscle use, no dullness to percussion  CARD:  RRR, no m/r/g, no peripheral edema, pulses intact, no cyanosis or clubbing.  GI:   Soft & nt; nml bowel sounds; no organomegaly or masses detected.   Musco: Warm bil, no deformities or joint swelling noted.   Neuro: alert, no focal deficits noted.    Skin: Warm, no lesions or rashes    Lab Results:  CBC   BNP   ProBNP No results found for: PROBNP  Imaging:     PFT Results Latest Ref Rng & Units 09/10/2019  FVC-Pre L 2.14  FVC-Predicted Pre % 107  FVC-Post L  2.02  FVC-Predicted Post % 101  Pre FEV1/FVC % % 82  Post FEV1/FCV % % 88  FEV1-Pre L 1.75  FEV1-Predicted Pre % 114  FEV1-Post L 1.78  DLCO uncorrected ml/min/mmHg 12.65  DLCO UNC% % 70  DLVA Predicted % 87  TLC L 3.75  TLC % Predicted % 78  RV % Predicted % 72    No results found for: NITRICOXIDE      Assessment & Plan:   OSA on CPAP Patient having difficulty tolerating CPAP. We will  try to obtain her previous sleep study results. CPAP download was requested Add in saline nasal spray and saline nasal gel.  Plan  Patient Instructions  Wear CPAP all night long long  Try saline nasal spray Twice daily   Try saline nasal gel At bedtime   Try Dream wear nasal mask  Results from DME ([facility] in [place], [place] ) .  Follow up with Dr. Hermina Staggers in 3 months and As needed       Lung nodules Small pulmonary nodules being followed on serial CT.  Most recent CT chest November 2022 showed near resolution of a 6 right middle lobe nodule.  This is consistent with a benign etiology.  Some scattered 2 mm nodules remain.  At this time no further imaging is indicated.     Rexene Edison, NP 12/13/2021

## 2021-12-13 NOTE — Patient Instructions (Signed)
Wear CPAP all night long long  Try saline nasal spray Twice daily   Try saline nasal gel At bedtime   Try Dream wear nasal mask  Results from DME ([facility] in [place], [place] ) .  Follow up with Dr. Hermina Staggers in 3 months and As needed

## 2021-12-14 ENCOUNTER — Telehealth: Payer: Self-pay | Admitting: Cardiovascular Disease

## 2021-12-14 NOTE — Telephone Encounter (Signed)
Called patient, advised of message from MD.  Patient verbalized understanding.   

## 2021-12-14 NOTE — Telephone Encounter (Signed)
Pt is now needing to travel by train instead of airplane because it is less expensive and would like to know if this is ok as well... please advise

## 2021-12-14 NOTE — Telephone Encounter (Signed)
Just FYI.  Advised of previous message from MD that okay to travel, and to watch diet while traveling. Patient will call if she needs [place].  Thanks!

## 2021-12-17 ENCOUNTER — Ambulatory Visit: Payer: Medicare Other | Admitting: Internal Medicine

## 2021-12-17 NOTE — Progress Notes (Unsigned)
Name: Regina [name]  Age/ Sex: 81 y.o., female   MRN/ DOB: [id number], 20-Jun-1941     PCP: [name], [name], MD   Reason for Endocrinology Evaluation: Type 2 Diabetes Mellitus  Initial Endocrine Consultative Visit: 09/09/2020    PATIENT IDENTIFIER: [name] is a 81 y.o. female with a past medical history of T2DM, HTN, CAD, OSA on CPAP  and A.Fib. The patient has followed with Endocrinology clinic since 09/09/2020 for consultative assistance with management of her diabetes.  DIABETIC HISTORY:  Regina [name] was diagnosed with DM yrs ago. Her hemoglobin A1c has ranged from 6.9% in 2021, peaking at 7.8% in 2020   On her initial visit to our clinic she had an A1c of 7.4% She was on basal insulin and Ozemopic, she was already out of Ozempic and we held off until more data was available about her retinopathy. We started humalog per correction scale   She was started on Farxiga by cardiology 06/3733 Started Trulicity   Nephrology [facility] - Dr. peoples  SUBJECTIVE:   During the last visit (08/13/2021): A1c 7.6 % We continued Toujeo, humalog and farxiga    Today (12/17/2021): Regina [name] is here for a follow up on diabetes management.  She is accompanied by her son Regina [name]  . She checks her blood sugars multiple times a day through CGM . The patient has not had hypoglycemic episodes since the last clinic visit  She is on chronic prednisone for Polymyagia rheumatica, currently on 4 mg and tapering down  No nausea  vomiting or diarrhea    Trulicity  cost prohibitive  She was prescribed Cymbalta   HOME DIABETES REGIMEN:  Toujeo 26 units daily  Humalog 5 units TIDQAC CF: Humalog: ( BG-120/30) Farxiga 10 mg daily-through cardiology    Statin: yes ACE-I/ARB: no    CONTINUOUS GLUCOSE MONITORING RECORD INTERPRETATION    Dates of Recording: 10/15-10/28/2022  Sensor description:dexcom  Results statistics:   CGM use % of time 100  Average and SD 191/55   Time in range     45 %  % Time Above 180 39  % Time above 250 15  % Time Below target <1     Glycemic patterns summary: optimal BG's at night but high during the day   Hyperglycemic episodes  postprandial mainly lunch and dinner  Hypoglycemic episodes occurred n/a  Overnight periods: trends down    DIABETIC COMPLICATIONS: Microvascular complications:  CKD III, Retinopathy ( hx of laser )  Denies: neuropathy  Last eye exam: Completed 2021   Macrovascular complications:  Non-obstructive CAD  Denies: PVD, CVA     HISTORY:  Past Medical History:  Past Medical History:  Diagnosis Date   Back pain    CHF (congestive heart failure) (HCC)    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 01/2015 Echo: EF 55-60%, Gr 2 DD, mod LVH, mildly dil LA.   Heart disease    Hypertension    Hypothyroidism    Joint pain    Kidney problem    Lower extremity edema    Mini stroke    Non-obstructive CAD    a. 01/2015 Cardiolite: + inf wall ischemia, EF 56%;  b. 01/2015 Cath: LM nl, LAD 32m LCX min irregs, RCA  dominant, 50-2m   Sleep apnea    Swallowing difficulty    Thyroid disease    Past Surgical History:  Past Surgical History:  Procedure Laterality Date   ABDOMINAL HYSTERECTOMY     BUBBLE STUDY  07/01/2020   Procedure: BUBBLE STUDY;  Surgeon: AElouise Munroe MD;  [facility]  Service: Cardiovascular;;   BUBBLE STUDY  08/05/2020   Procedure: BUBBLE STUDY;  Surgeon: OGeralynn Rile MD;  [facility] [facility]  Service: Cardiovascular;;  done with definity    CARDIAC CATHETERIZATION     ESOPHAGOGASTRODUODENOSCOPY     a. 01/2015 EGD: patent esophagus.   ESOPHAGOGASTRODUODENOSCOPY N/A 01/20/2015   Procedure: ESOPHAGOGASTRODUODENOSCOPY (EGD);   Surgeon: PCarol Ada MD;  [facility];  Service: Endoscopy;  Laterality: N/A;   HAND SURGERY     JOINT REPLACEMENT     reports history bilateral TKA and right TSA   LEFT HEART CATHETERIZATION WITH CORONARY ANGIOGRAM N/A 01/19/2015   TEE WITHOUT CARDIOVERSION  05/04/2020   TEE WITHOUT CARDIOVERSION N/A 05/05/2020   Procedure: TRANSESOPHAGEAL ECHOCARDIOGRAM (TEE);  Surgeon: HPixie Casino MD;  [facility];  Service: Cardiovascular;  Laterality: N/A;   TEE WITHOUT CARDIOVERSION N/A 07/01/2020   Procedure: TRANSESOPHAGEAL ECHOCARDIOGRAM (TEE);  Surgeon: AElouise Munroe MD;  [facility]  Service: Cardiovascular;  Laterality: N/A;   TEE WITHOUT CARDIOVERSION N/A 08/05/2020   Procedure: TRANSESOPHAGEAL ECHOCARDIOGRAM (TEE);  Surgeon: OGeralynn Rile MD;  [place] [facility]  Service: Cardiovascular;  Laterality: N/A;   THYROIDECTOMY     Social History:  reports that she has never smoked. She has never used smokeless tobacco. She reports that she does not drink alcohol. No history on file for drug use. Family History:  Family History  Problem Relation Age of Onset   Heart disease Mother    Hypertension Mother    Cancer Father    Alcoholism Father      HOME MEDICATIONS: Allergies as of 12/17/2021       Reactions   Ace Inhibitors Other (See Comments)   unknown   Amlodipine Other (See Comments)   unknown   Atenolol Other (See Comments)   bradycardia   Avandia [rosiglitazone] Other (See Comments)   unknown   Darvon [propoxyphene] Other (See Comments)   unknown   Erythromycin Itching   Hydralazine Other (See Comments)   Burning in throat and chest   Hydrocodone Other (See Comments)   Hallucinations.   Levofloxacin Itching   Morphine And Related Other (See Comments)   Dizzy and hallucianation, vomiting; Willing to try low dose   Percocet [oxycodone-acetaminophen] Other (See Comments)   hallucination   Spironolactone Other (See  Comments)   unknown   Tramadol Other (See Comments)   Unknown/does not recall reaction but does not want to take again        Medication List        Accurate as of December 17, 2021 10:05 AM. If you have any questions, ask your nurse or doctor.          Accu-Chek FLucent TechnologiesKit Use to test blood sugar up to 3 times daily   Accu-Chek Guide test strip Generic drug: glucose blood Use to test blood sugar up to 3 times daily   Accu-Chek Guide w/Device Kit 1 Device by Does not apply route 3 (three) times daily.   allopurinol 100 MG tablet Commonly known as: ZYLOPRIM TAKE ONE TABLET BY MOUTH EVERY MORNING   atorvastatin 20 MG tablet Commonly known as:  LIPITOR TAKE ONE TABLET BY MOUTH EVERYDAY AT BEDTIME   B-D SINGLE USE SWABS REGULAR Pads Use to test blood sugar up to 3 times daily   Capsaicin 0.033 % Crea Apply 1 application topically 3 (three) times daily.   Dexcom G6 Sensor Misc 1 Device by Does not apply route as directed.   Dexcom G6 Transmitter Misc 1 Device by Does not apply route as directed.   diclofenac Sodium 1 % Gel Commonly known as: VOLTAREN Apply 1 application topically daily as needed (arthritis pain/hands).   famotidine 20 MG tablet Commonly known as: PEPCID Take 1 tablet (20 mg total) by mouth at bedtime.   Farxiga 10 MG Tabs tablet Generic drug: dapagliflozin propanediol Take 1 tablet (10 mg total) by mouth daily.   gabapentin 600 MG tablet Commonly known as: NEURONTIN TAKE ONE TABLET BY MOUTH EVERY MORNING and TAKE ONE TABLET BY MOUTH EVERYDAY AT BEDTIME   insulin lispro 100 UNIT/ML KwikPen Commonly known as: HumaLOG KwikPen Max daily 15 units What changed:  how much to take when to take this   Insulin Pen Needle 32G X 4 MM Misc 1 Device by Does not apply route in the morning, at noon, in the evening, and at bedtime.   Comfort EZ Pen Needles 31G X 8 MM Misc Generic drug: Insulin Pen Needle USE 3 TO 4 times daily as directed    ipratropium 0.06 % nasal spray Commonly known as: ATROVENT Place 2 sprays into both nostrils 4 (four) times daily.   levothyroxine 150 MCG tablet Commonly known as: SYNTHROID TAKE ONE TABLET BY MOUTH BEFORE BREAKFAST   magnesium oxide 400 MG tablet Commonly known as: MAG-OX TAKE ONE TABLET BY MOUTH EVERY MORNING   meclizine 25 MG tablet Commonly known as: ANTIVERT Take 25 mg by mouth daily as needed for dizziness.   metoprolol succinate 25 MG 24 hr tablet Commonly known as: TOPROL-XL Take 1 tablet (25 mg total) by mouth daily. Take with or immediately following a meal.   MIRALAX PO Take by mouth daily as needed.   nitroGLYCERIN 0.4 MG SL tablet Commonly known as: NITROSTAT Place 1 tablet (0.4 mg total) under the tongue every 5 (five) minutes as needed for chest pain.   SYSTANE BALANCE OP Place 1 drop into both eyes daily as needed (for dry eyes).   Tafamidis 61 MG Caps Take 61 mg by mouth daily.   tiZANidine 4 MG tablet Commonly known as: Zanaflex Take 0.5-1 tablets (2-4 mg total) by mouth at bedtime as needed for muscle spasms.   torsemide 20 MG tablet Commonly known as: DEMADEX TAKE TWO TABLETS BY MOUTH EVERY MORNING Ok to take extra dose if weight gain of 1lb in 1 day or 5 lbs in 7 days   Toujeo SoloStar 300 UNIT/ML Solostar Pen Generic drug: insulin glargine (1 Unit Dial) Inject 26 Units into the skin daily. Eat a snack with protein nightly before bedtime.   trolamine salicylate 10 % cream Commonly known as: ASPERCREME Apply 1 application topically as needed for muscle pain.   Vitamin D3 50 MCG (2000 UT) capsule Take 2,000 Units by mouth daily.   warfarin 5 MG tablet Commonly known as: COUMADIN Take as directed by the anticoagulation clinic. If you are unsure how to take this medication, talk to your nurse or doctor. Original instructions: TAKE 1/2 TO 1 TABLET BY MOUTH daily OR AS DIRECTED by THE coumadin clinic         OBJECTIVE:   Vital Signs:  There  were no vitals taken for this visit.  Wt Readings from Last 3 Encounters:  12/13/21 227 lb 6.4 oz (103.1 kg)  11/23/21 229 lb 6.4 oz (104.1 kg)  11/08/21 226 lb (102.5 kg)     Exam: General: Pt appears well and is in NAD  Lungs: Clear with good BS bilat   Heart: RRR   Extremities: No pretibial edema.   Neuro: MS is good with appropriate affect, pt is alert and Ox3     DM foot exam: 05/14/2021   The skin of the feet is intact without sores or ulcerations. The pedal pulses are undetectable The sensation is decreased to a screening 5.07, 10 gram monofilament bilaterally   DATA REVIEWED:  Lab Results  Component Value Date   HGBA1C 7.6 (A) 08/13/2021   HGBA1C 7.9 (H) 08/11/2021   HGBA1C 7.2 (H) 06/18/2021   Results for Regina [name], Regina [name] (MRN [id number]) as of 08/16/2021 14:45  Ref. Range 08/13/2021 15:01  Sodium Latest Ref Range: 135 - 145 mEq/L 137  Potassium Latest Ref Range: 3.5 - 5.1 mEq/L 3.5  Chloride Latest Ref Range: 96 - 112 mEq/L 94 (L)  CO2 Latest Ref Range: 19 - 32 mEq/L 35 (H)  Glucose Latest Ref Range: 70 - 99 mg/dL 190 (H)  BUN Latest Ref Range: 6 - 23 mg/dL 59 (H)  Creatinine Latest Ref Range: 0.40 - 1.20 mg/dL 1.94 (H)  Calcium Latest Ref Range: 8.4 - 10.5 mg/dL 9.5  GFR Latest Ref Range: >60.00 mL/min 24.47 (L)  Total CHOL/HDL Ratio Unknown 2  Cholesterol Latest Ref Range: 0 - 200 mg/dL 115  HDL Cholesterol Latest Ref Range: >39.00 mg/dL 54.80  LDL (calc) Latest Ref Range: 0 - 99 mg/dL 40  MICROALB/CREAT RATIO Latest Ref Range: 0.0 - 30.0 mg/g 4.3  NonHDL Unknown 60.32  Triglycerides Latest Ref Range: 0.0 - 149.0 mg/dL 101.0  VLDL Latest Ref Range: 0.0 - 40.0 mg/dL 20.2  Creatinine,U Latest Units: mg/dL 16.4  Microalb, Ur Latest Ref Range: 0.0 - 1.9 mg/dL <0.7    ASSESSMENT / PLAN / RECOMMENDATIONS:   1) Type 2 Diabetes Mellitus, Sub-Optimally controlled, With retinopathic, neuropathic , CKD IV and Macrovascular   complications - Most recent  A1c of 7.6 %. Goal A1c < 7.5 %.    -Currently on Prednisone for polymyalgia rheumatica, but A1c is trending down  - She has not been able to start Trulicity due to cost  -She is on Farxiga through cardiology - Will restart a standing dose of prandial insulin to be used in addition to correction scale due to hyperglycemia  - BMP shows stable renal function   MEDICATIONS: Continue Toujeo  26 units daily  Humalog 5 units with each meal  Correction Scale  : Humalog ( BG -125/30) Patient on Farxiga 10 mg to cardiology    EDUCATION / INSTRUCTIONS: BG monitoring instructions: Patient is instructed to check her blood sugars 3 times a day, before meals . Call [facility] if: BG persistently < 70  I reviewed the Rule of 15 for the treatment of hypoglycemia in detail with the patient. Literature supplied.      2) Diabetic complications:  Eye: Does  have known diabetic retinopathy.  Neuro/ Feet: Does have known diabetic peripheral neuropathy. Renal: Patient does have known baseline CKD. She is not on an ACEI/ARB at present.    3) Dyslipidemia :  - LDL at goal   Continue Atorvastatin 20 mg daily    F/U in 4 months  Signed electronically by: Mack Guise, MD  [facility]  [facility] [address] [place], [place] [address] Phone: [phone] FAX: [phone]   CC: [name], [name], [place] [address] Phone: [phone]  Fax: [phone]  Return to Endocrinology clinic as below: Future Appointments  Date Time Provider [place]  12/17/2021  2:40 PM Floreen Teegarden, Melanie Crazier, MD [facility] None  01/04/2022  9:15 AM [facility]  01/18/2022 11:30 AM Marcial Pacas, MD [facility] None  01/24/2022  1:30 PM [facility] PHARMACIST [facility]  02/16/2022  9:45 AM [facility]SE HEALTH ADVISOR [facility]  02/21/2022  1:30 PM Laurin Coder, MD  [facility] None

## 2021-12-24 ENCOUNTER — Observation Stay (HOSPITAL_COMMUNITY): Payer: Medicare Other

## 2021-12-24 ENCOUNTER — Other Ambulatory Visit: Payer: Self-pay

## 2021-12-24 ENCOUNTER — Telehealth: Payer: Self-pay | Admitting: Cardiovascular Disease

## 2021-12-24 ENCOUNTER — Inpatient Hospital Stay (HOSPITAL_COMMUNITY)
Admission: EM | Admit: 2021-12-24 | Discharge: 2021-12-28 | DRG: 286 | Disposition: A | Discharge status: Home / Self Care | Payer: Medicare Other | Attending: Internal Medicine | Admitting: Internal Medicine

## 2021-12-24 ENCOUNTER — Emergency Department (HOSPITAL_COMMUNITY): Payer: Medicare Other

## 2021-12-24 ENCOUNTER — Encounter (HOSPITAL_COMMUNITY): Payer: Self-pay | Admitting: Emergency Medicine

## 2021-12-24 DIAGNOSIS — Z96653 Presence of artificial knee joint, bilateral: Secondary | ICD-10-CM | POA: Diagnosis present

## 2021-12-24 DIAGNOSIS — I5033 Acute on chronic diastolic (congestive) heart failure: Secondary | ICD-10-CM | POA: Diagnosis present

## 2021-12-24 DIAGNOSIS — E8582 Wild-type transthyretin-related (ATTR) amyloidosis: Secondary | ICD-10-CM | POA: Diagnosis present

## 2021-12-24 DIAGNOSIS — Z881 Allergy status to other antibiotic agents status: Secondary | ICD-10-CM

## 2021-12-24 DIAGNOSIS — N179 Acute kidney failure, unspecified: Secondary | ICD-10-CM

## 2021-12-24 DIAGNOSIS — I2721 Secondary pulmonary arterial hypertension: Secondary | ICD-10-CM | POA: Diagnosis present

## 2021-12-24 DIAGNOSIS — I248 Other forms of acute ischemic heart disease: Secondary | ICD-10-CM | POA: Diagnosis present

## 2021-12-24 DIAGNOSIS — Z8616 Personal history of COVID-19: Secondary | ICD-10-CM

## 2021-12-24 DIAGNOSIS — K219 Gastro-esophageal reflux disease without esophagitis: Secondary | ICD-10-CM | POA: Diagnosis present

## 2021-12-24 DIAGNOSIS — I513 Intracardiac thrombosis, not elsewhere classified: Secondary | ICD-10-CM | POA: Diagnosis present

## 2021-12-24 DIAGNOSIS — Z794 Long term (current) use of insulin: Secondary | ICD-10-CM

## 2021-12-24 DIAGNOSIS — Z8673 Personal history of transient ischemic attack (TIA), and cerebral infarction without residual deficits: Secondary | ICD-10-CM

## 2021-12-24 DIAGNOSIS — I4819 Other persistent atrial fibrillation: Secondary | ICD-10-CM | POA: Diagnosis not present

## 2021-12-24 DIAGNOSIS — Z885 Allergy status to narcotic agent status: Secondary | ICD-10-CM

## 2021-12-24 DIAGNOSIS — E890 Postprocedural hypothyroidism: Secondary | ICD-10-CM | POA: Diagnosis not present

## 2021-12-24 DIAGNOSIS — Z79890 Hormone replacement therapy (postmenopausal): Secondary | ICD-10-CM

## 2021-12-24 DIAGNOSIS — I2510 Atherosclerotic heart disease of native coronary artery without angina pectoris: Secondary | ICD-10-CM | POA: Diagnosis present

## 2021-12-24 DIAGNOSIS — E782 Mixed hyperlipidemia: Secondary | ICD-10-CM | POA: Diagnosis present

## 2021-12-24 DIAGNOSIS — E854 Organ-limited amyloidosis: Secondary | ICD-10-CM | POA: Diagnosis present

## 2021-12-24 DIAGNOSIS — I272 Pulmonary hypertension, unspecified: Secondary | ICD-10-CM | POA: Diagnosis not present

## 2021-12-24 DIAGNOSIS — I43 Cardiomyopathy in diseases classified elsewhere: Secondary | ICD-10-CM | POA: Diagnosis not present

## 2021-12-24 DIAGNOSIS — N183 Chronic kidney disease, stage 3 unspecified: Secondary | ICD-10-CM | POA: Diagnosis not present

## 2021-12-24 DIAGNOSIS — E1142 Type 2 diabetes mellitus with diabetic polyneuropathy: Secondary | ICD-10-CM | POA: Diagnosis present

## 2021-12-24 DIAGNOSIS — N184 Chronic kidney disease, stage 4 (severe): Secondary | ICD-10-CM | POA: Diagnosis present

## 2021-12-24 DIAGNOSIS — E669 Obesity, unspecified: Secondary | ICD-10-CM | POA: Diagnosis present

## 2021-12-24 DIAGNOSIS — I517 Cardiomegaly: Secondary | ICD-10-CM | POA: Diagnosis not present

## 2021-12-24 DIAGNOSIS — R0789 Other chest pain: Secondary | ICD-10-CM | POA: Diagnosis not present

## 2021-12-24 DIAGNOSIS — Z6841 Body Mass Index (BMI) 40.0 and over, adult: Secondary | ICD-10-CM | POA: Diagnosis not present

## 2021-12-24 DIAGNOSIS — Z9989 Dependence on other enabling machines and devices: Secondary | ICD-10-CM

## 2021-12-24 DIAGNOSIS — Z7901 Long term (current) use of anticoagulants: Secondary | ICD-10-CM

## 2021-12-24 DIAGNOSIS — E039 Hypothyroidism, unspecified: Secondary | ICD-10-CM | POA: Diagnosis present

## 2021-12-24 DIAGNOSIS — I5043 Acute on chronic combined systolic (congestive) and diastolic (congestive) heart failure: Secondary | ICD-10-CM | POA: Diagnosis not present

## 2021-12-24 DIAGNOSIS — I4892 Unspecified atrial flutter: Secondary | ICD-10-CM | POA: Diagnosis not present

## 2021-12-24 DIAGNOSIS — I130 Hypertensive heart and chronic kidney disease with heart failure and stage 1 through stage 4 chronic kidney disease, or unspecified chronic kidney disease: Secondary | ICD-10-CM | POA: Diagnosis not present

## 2021-12-24 DIAGNOSIS — G4733 Obstructive sleep apnea (adult) (pediatric): Secondary | ICD-10-CM | POA: Diagnosis not present

## 2021-12-24 DIAGNOSIS — R079 Chest pain, unspecified: Secondary | ICD-10-CM | POA: Diagnosis not present

## 2021-12-24 DIAGNOSIS — Z8249 Family history of ischemic heart disease and other diseases of the circulatory system: Secondary | ICD-10-CM

## 2021-12-24 DIAGNOSIS — Z888 Allergy status to other drugs, medicaments and biological substances status: Secondary | ICD-10-CM

## 2021-12-24 DIAGNOSIS — Z90710 Acquired absence of both cervix and uterus: Secondary | ICD-10-CM

## 2021-12-24 DIAGNOSIS — Z20822 Contact with and (suspected) exposure to covid-19: Secondary | ICD-10-CM | POA: Diagnosis present

## 2021-12-24 DIAGNOSIS — E1122 Type 2 diabetes mellitus with diabetic chronic kidney disease: Secondary | ICD-10-CM | POA: Diagnosis not present

## 2021-12-24 DIAGNOSIS — J811 Chronic pulmonary edema: Secondary | ICD-10-CM | POA: Diagnosis not present

## 2021-12-24 DIAGNOSIS — Z7984 Long term (current) use of oral hypoglycemic drugs: Secondary | ICD-10-CM

## 2021-12-24 DIAGNOSIS — Z96611 Presence of right artificial shoulder joint: Secondary | ICD-10-CM | POA: Diagnosis present

## 2021-12-24 DIAGNOSIS — Z23 Encounter for immunization: Secondary | ICD-10-CM | POA: Diagnosis not present

## 2021-12-24 DIAGNOSIS — Z79899 Other long term (current) drug therapy: Secondary | ICD-10-CM

## 2021-12-24 DIAGNOSIS — I10 Essential (primary) hypertension: Secondary | ICD-10-CM | POA: Diagnosis not present

## 2021-12-24 DIAGNOSIS — E876 Hypokalemia: Secondary | ICD-10-CM | POA: Diagnosis not present

## 2021-12-24 LAB — GLUCOSE, CAPILLARY
Glucose-Capillary: 150 mg/dL — ABNORMAL HIGH (ref 70–99)
Glucose-Capillary: 257 mg/dL — ABNORMAL HIGH (ref 70–99)

## 2021-12-24 LAB — TROPONIN I (HIGH SENSITIVITY)
Troponin I (High Sensitivity): 85 ng/L — ABNORMAL HIGH (ref <18)
Troponin I (High Sensitivity): 79 ng/L — ABNORMAL HIGH (ref <18)

## 2021-12-24 LAB — RESP PANEL BY RT-PCR (FLU A&B, COVID) ARPGX2
Influenza A by PCR: NEGATIVE
Influenza B by PCR: NEGATIVE
SARS Coronavirus 2 by RT PCR: NEGATIVE

## 2021-12-24 LAB — BASIC METABOLIC PANEL
Anion gap: 9 (ref 5–15)
BUN: 43 mg/dL — ABNORMAL HIGH (ref 8–23)
CO2: 31 mmol/L (ref 22–32)
Calcium: 9.1 mg/dL (ref 8.9–10.3)
Chloride: 99 mmol/L (ref 98–111)
Creatinine, Ser: 2.12 mg/dL — ABNORMAL HIGH (ref 0.44–1.00)
GFR, Estimated: 23 mL/min — ABNORMAL LOW (ref >60)
Glucose, Bld: 156 mg/dL — ABNORMAL HIGH (ref 70–99)
Potassium: 3.4 mmol/L — ABNORMAL LOW (ref 3.5–5.1)
Sodium: 139 mmol/L (ref 135–145)

## 2021-12-24 LAB — CBC
HCT: 39.5 % (ref 36.0–46.0)
Hemoglobin: 12.3 g/dL (ref 12.0–15.0)
MCH: 28.5 pg (ref 26.0–34.0)
MCHC: 31.1 g/dL (ref 30.0–36.0)
MCV: 91.4 fL (ref 80.0–100.0)
Platelets: 275 10*3/uL (ref 150–400)
RBC: 4.32 MIL/uL (ref 3.87–5.11)
RDW: 14.2 % (ref 11.5–15.5)
WBC: 6.8 10*3/uL (ref 4.0–10.5)
nRBC: 0 % (ref 0.0–0.2)

## 2021-12-24 LAB — BRAIN NATRIURETIC PEPTIDE: B Natriuretic Peptide: 548.1 pg/mL — ABNORMAL HIGH (ref 0.0–100.0)

## 2021-12-24 LAB — ECHOCARDIOGRAM COMPLETE: S' Lateral: 3.4 cm

## 2021-12-24 LAB — PROTIME-INR
INR: 2.8 — ABNORMAL HIGH (ref 0.8–1.2)
Prothrombin Time: 29.6 seconds — ABNORMAL HIGH (ref 11.4–15.2)

## 2021-12-24 LAB — D-DIMER, QUANTITATIVE: D-Dimer, Quant: 0.43 ug/mL-FEU (ref 0.00–0.50)

## 2021-12-24 MED ORDER — ACETAMINOPHEN 650 MG RE SUPP
650.0000 mg | Freq: Four times a day (QID) | RECTAL | Status: DC | PRN
Start: 2021-12-24 — End: 2021-12-28

## 2021-12-24 MED ORDER — DOCUSATE SODIUM 100 MG PO CAPS
100.0000 mg | ORAL_CAPSULE | Freq: Two times a day (BID) | ORAL | Status: DC
  Administered 2021-12-25 – 2021-12-28 (×4): 100 mg via ORAL
  Filled 2021-12-24 (×5): qty 1

## 2021-12-24 MED ORDER — ONDANSETRON HCL 4 MG PO TABS: 4.0000 mg | ORAL_TABLET | Freq: Four times a day (QID) | ORAL | Status: DC | PRN

## 2021-12-24 MED ORDER — GABAPENTIN 600 MG PO TABS
600.0000 mg | ORAL_TABLET | Freq: Two times a day (BID) | ORAL | Status: DC
Start: 2021-12-24 — End: 2021-12-28
  Administered 2021-12-24 – 2021-12-28 (×8): 600 mg via ORAL
  Filled 2021-12-24 (×8): qty 1

## 2021-12-24 MED ORDER — FUROSEMIDE 10 MG/ML IJ SOLN
80.0000 mg | Freq: Two times a day (BID) | INTRAMUSCULAR | Status: DC
  Administered 2021-12-24 – 2021-12-28 (×7): 80 mg via INTRAVENOUS
  Filled 2021-12-24 (×7): qty 8

## 2021-12-24 MED ORDER — INSULIN GLARGINE (1 UNIT DIAL) 300 UNIT/ML ~~LOC~~ SOPN: 26.0000 [IU] | PEN_INJECTOR | Freq: Every day | SUBCUTANEOUS | Status: DC

## 2021-12-24 MED ORDER — MAGNESIUM OXIDE -MG SUPPLEMENT 400 (240 MG) MG PO TABS
400.0000 mg | ORAL_TABLET | Freq: Every morning | ORAL | Status: DC
Start: 2021-12-25 — End: 2021-12-28
  Administered 2021-12-25 – 2021-12-28 (×4): 400 mg via ORAL
  Filled 2021-12-24 (×5): qty 1

## 2021-12-24 MED ORDER — POLYETHYLENE GLYCOL 3350 17 G PO PACK: 17.0000 g | PACK | Freq: Every day | ORAL | Status: DC | PRN

## 2021-12-24 MED ORDER — ASPIRIN EC 81 MG PO TBEC
81.0000 mg | DELAYED_RELEASE_TABLET | Freq: Every day | ORAL | Status: DC
  Administered 2021-12-24 – 2021-12-28 (×5): 81 mg via ORAL
  Filled 2021-12-24 (×5): qty 1

## 2021-12-24 MED ORDER — POLYVINYL ALCOHOL 1.4 % OP SOLN
Freq: Every day | OPHTHALMIC | Status: DC | PRN
  Filled 2021-12-24: qty 15

## 2021-12-24 MED ORDER — INSULIN ASPART 100 UNIT/ML IJ SOLN
0.0000 [IU] | Freq: Three times a day (TID) | INTRAMUSCULAR | Status: DC
  Administered 2021-12-24: 2 [IU] via SUBCUTANEOUS
  Administered 2021-12-27: 5 [IU] via SUBCUTANEOUS

## 2021-12-24 MED ORDER — METOPROLOL SUCCINATE ER 25 MG PO TB24
25.0000 mg | ORAL_TABLET | Freq: Every day | ORAL | Status: DC
  Administered 2021-12-24 – 2021-12-28 (×5): 25 mg via ORAL
  Filled 2021-12-24 (×5): qty 1

## 2021-12-24 MED ORDER — WARFARIN - PHARMACIST DOSING INPATIENT: Freq: Every day | Status: DC

## 2021-12-24 MED ORDER — NITROGLYCERIN 0.4 MG SL SUBL: SUBLINGUAL_TABLET | SUBLINGUAL | 2 refills | Status: DC

## 2021-12-24 MED ORDER — TAFAMIDIS 61 MG PO CAPS
61.0000 mg | ORAL_CAPSULE | Freq: Every day | ORAL | Status: DC
  Administered 2021-12-25 – 2021-12-28 (×4): 61 mg via ORAL
  Filled 2021-12-24 (×3): qty 1

## 2021-12-24 MED ORDER — WARFARIN SODIUM 2.5 MG PO TABS
2.5000 mg | ORAL_TABLET | Freq: Once | ORAL | Status: DC
  Filled 2021-12-24: qty 1

## 2021-12-24 MED ORDER — ALLOPURINOL 100 MG PO TABS
100.0000 mg | ORAL_TABLET | Freq: Every morning | ORAL | Status: DC
Start: 2021-12-25 — End: 2021-12-28
  Administered 2021-12-25 – 2021-12-28 (×4): 100 mg via ORAL
  Filled 2021-12-24 (×3): qty 1

## 2021-12-24 MED ORDER — FUROSEMIDE 10 MG/ML IJ SOLN: 80.0000 mg | Freq: Two times a day (BID) | INTRAMUSCULAR | Status: DC

## 2021-12-24 MED ORDER — TRAZODONE HCL 50 MG PO TABS: 25.0000 mg | ORAL_TABLET | Freq: Every evening | ORAL | Status: DC | PRN

## 2021-12-24 MED ORDER — WARFARIN SODIUM 2.5 MG PO TABS
2.5000 mg | ORAL_TABLET | Freq: Once | ORAL | Status: AC
  Administered 2021-12-24: 2.5 mg via ORAL
  Filled 2021-12-24: qty 1

## 2021-12-24 MED ORDER — FAMOTIDINE 20 MG PO TABS
20.0000 mg | ORAL_TABLET | Freq: Every day | ORAL | Status: DC
  Administered 2021-12-24 – 2021-12-27 (×4): 20 mg via ORAL
  Filled 2021-12-24 (×4): qty 1

## 2021-12-24 MED ORDER — SODIUM CHLORIDE 0.9% FLUSH
3.0000 mL | Freq: Two times a day (BID) | INTRAVENOUS | Status: DC
  Administered 2021-12-24 – 2021-12-27 (×6): 3 mL via INTRAVENOUS

## 2021-12-24 MED ORDER — INSULIN GLARGINE-YFGN 100 UNIT/ML ~~LOC~~ SOLN
26.0000 [IU] | Freq: Every day | SUBCUTANEOUS | Status: DC
  Filled 2021-12-24: qty 0.26

## 2021-12-24 MED ORDER — ACETAMINOPHEN 325 MG PO TABS
650.0000 mg | ORAL_TABLET | Freq: Four times a day (QID) | ORAL | Status: DC | PRN
  Administered 2021-12-25 (×2): 650 mg via ORAL
  Filled 2021-12-24 (×2): qty 2

## 2021-12-24 MED ORDER — LEVOTHYROXINE SODIUM 75 MCG PO TABS
150.0000 ug | ORAL_TABLET | Freq: Every day | ORAL | Status: DC
  Administered 2021-12-25 – 2021-12-28 (×4): 150 ug via ORAL
  Filled 2021-12-24 (×4): qty 2

## 2021-12-24 MED ORDER — BISACODYL 5 MG PO TBEC: 5.0000 mg | DELAYED_RELEASE_TABLET | Freq: Every day | ORAL | Status: DC | PRN

## 2021-12-24 MED ORDER — ATORVASTATIN CALCIUM 10 MG PO TABS
20.0000 mg | ORAL_TABLET | Freq: Every day | ORAL | Status: DC
  Administered 2021-12-24 – 2021-12-27 (×4): 20 mg via ORAL
  Filled 2021-12-24 (×4): qty 2

## 2021-12-24 MED ORDER — ONDANSETRON HCL 4 MG/2ML IJ SOLN: 4.0000 mg | Freq: Four times a day (QID) | INTRAMUSCULAR | Status: DC | PRN

## 2021-12-24 MED ORDER — INSULIN ASPART 100 UNIT/ML IJ SOLN
0.0000 [IU] | Freq: Every day | INTRAMUSCULAR | Status: DC
  Administered 2021-12-24: 21:00:00 3 [IU] via SUBCUTANEOUS

## 2021-12-24 MED ORDER — INSULIN GLARGINE-YFGN 100 UNIT/ML ~~LOC~~ SOLN
26.0000 [IU] | Freq: Every day | SUBCUTANEOUS | Status: DC
  Administered 2021-12-24 – 2021-12-26 (×3): 26 [IU] via SUBCUTANEOUS
  Filled 2021-12-24 (×4): qty 0.26

## 2021-12-24 NOTE — ED Provider Notes (Signed)
[facility] Provider Note   CSN: [id number] Arrival date & time: 12/24/21  1003     History  Chief Complaint  Patient presents with   Chest Pain    Regina [name] is a 81 y.o. female.  Here with chest pain on and off for the last week.  Chest pain for last 4 hours.  Minimal relief with nitroglycerin.  History of cardiac amyloidosis.  Atrial thrombus history on Coumadin.  Nonobstructive CAD.  Recent COVID several weeks ago.  Denies any shortness of breath or cough or sputum production.  The history is provided by the patient.  Chest Pain Pain location:  R chest Pain quality: pressure   Pain radiates to:  Neck and L arm Pain severity:  Mild Onset quality:  Gradual Duration:  1 week Timing:  Intermittent Progression:  Worsening Chronicity:  New Context: at rest   Relieved by:  Nitroglycerin (mildly with nitro) Worsened by:  Nothing Associated symptoms: no abdominal pain, no altered mental status, no anorexia, no anxiety, no back pain, no claudication, no cough, no diaphoresis, no dizziness, no dysphagia, no fatigue, no fever, no headache, no heartburn, no lower extremity edema, no nausea, no near-syncope, no numbness, no orthopnea, no palpitations, no PND, no shortness of breath, no syncope, no vomiting and no weakness   Risk factors: coronary artery disease and hypertension   Risk factors comment:  CHF     Home Medications Prior to Admission medications   Medication Sig Start Date End Date Taking? Authorizing Provider  Alcohol Swabs (B-D SINGLE USE SWABS REGULAR) PADS Use to test blood sugar up to 3 times daily 01/11/21   Shamleffer, Melanie Crazier, MD  allopurinol (ZYLOPRIM) 100 MG tablet TAKE ONE TABLET BY MOUTH EVERY MORNING 11/11/21   [name], [name], MD  atorvastatin (LIPITOR) 20 MG tablet TAKE ONE TABLET BY MOUTH EVERYDAY AT BEDTIME 12/13/21   [name], [name], MD  Blood Glucose Monitoring Suppl (ACCU-CHEK GUIDE) w/Device KIT 1  Device by Does not apply route 3 (three) times daily. 06/10/19   [name], [name], MD  Capsaicin 0.033 % CREA Apply 1 application topically 3 (three) times daily. 05/14/21   Shamleffer, Melanie Crazier, MD  Cholecalciferol (VITAMIN D3) 50 MCG (2000 UT) capsule Take 2,000 Units by mouth daily.    [provider]  COMFORT EZ PEN NEEDLES 31G X 8 MM MISC USE 3 TO 4 times daily as directed 10/14/21   Shamleffer, Melanie Crazier, MD  Continuous Blood Gluc Sensor (DEXCOM G6 SENSOR) MISC 1 Device by Does not apply route as directed. 01/08/21   Shamleffer, Melanie Crazier, MD  Continuous Blood Gluc Transmit (DEXCOM G6 TRANSMITTER) MISC 1 Device by Does not apply route as directed. 01/08/21   Shamleffer, Melanie Crazier, MD  diclofenac Sodium (VOLTAREN) 1 % GEL Apply 1 application topically daily as needed (arthritis pain/hands).    [provider]  famotidine (PEPCID) 20 MG tablet Take 1 tablet (20 mg total) by mouth at bedtime. 04/14/21   [name], [name], MD  FARXIGA 10 MG TABS tablet Take 1 tablet (10 mg total) by mouth daily. 04/23/21   Geralynn Rile, MD  gabapentin (NEURONTIN) 600 MG tablet TAKE ONE TABLET BY MOUTH EVERY MORNING and TAKE ONE TABLET BY MOUTH EVERYDAY AT BEDTIME 10/13/21   [name], [name], MD  glucose blood (ACCU-CHEK GUIDE) test strip Use to test blood sugar up to 3 times daily 04/21/21   Shamleffer, Melanie Crazier, MD  insulin glargine, 1  Unit Dial, (TOUJEO SOLOSTAR) 300 UNIT/ML Solostar Pen Inject 26 Units into the skin daily. Eat a snack with protein nightly before bedtime. 04/21/21   Shamleffer, Melanie Crazier, MD  insulin lispro (HUMALOG KWIKPEN) 100 UNIT/ML KwikPen Max daily 15 units Patient taking differently: 5 Units 3 (three) times daily. Max daily 15 units 04/21/21   Shamleffer, Melanie Crazier, MD  Insulin Pen Needle 32G X 4 MM MISC 1 Device by Does not apply route in the morning, at noon, in the evening, and at bedtime. 01/11/21   Shamleffer, Melanie Crazier, MD   ipratropium (ATROVENT) 0.06 % nasal spray Place 2 sprays into both nostrils 4 (four) times daily. 09/14/21   [name], [name], MD  Lancets Misc. (ACCU-CHEK FASTCLIX LANCET) KIT Use to test blood sugar up to 3 times daily 04/21/21   Shamleffer, Melanie Crazier, MD  levothyroxine (SYNTHROID) 150 MCG tablet TAKE ONE TABLET BY MOUTH BEFORE BREAKFAST 10/13/21   [name], [name], MD  magnesium oxide (MAG-OX) 400 MG tablet TAKE ONE TABLET BY MOUTH EVERY MORNING 11/11/21   [name], [name], MD  meclizine (ANTIVERT) 25 MG tablet Take 25 mg by mouth daily as needed for dizziness. 10/01/14   [provider]  metoprolol succinate (TOPROL-XL) 25 MG 24 hr tablet Take 1 tablet (25 mg total) by mouth daily. Take with or immediately following a meal. 07/12/21   O'Neal, Cassie Freer, MD  nitroGLYCERIN (NITROSTAT) 0.4 MG SL tablet Place 1 tablet under tongue for chest pain. Repeat every five minutes 2 more times if needed for a total of 3 tablets within 15 minutes. 12/24/21   O'NealCassie Freer, MD  Polyethylene Glycol 3350 (MIRALAX PO) Take by mouth daily as needed.    [provider]  Propylene Glycol (SYSTANE BALANCE OP) Place 1 drop into both eyes daily as needed (for dry eyes).    [provider]  Tafamidis 61 MG CAPS Take 61 mg by mouth daily. 08/19/21   O'NealCassie Freer, MD  tiZANidine (ZANAFLEX) 4 MG tablet Take 0.5-1 tablets (2-4 mg total) by mouth at bedtime as needed for muscle spasms. 02/15/21   [name], [name], MD  torsemide ([place]) 20 MG tablet TAKE TWO TABLETS BY MOUTH EVERY MORNING Ok to take extra dose if weight gain of 1lb in 1 day or 5 lbs in 7 days 10/13/21   O'Neal, Cassie Freer, MD  trolamine salicylate (ASPERCREME) 10 % cream Apply 1 application topically as needed for muscle pain.    [provider]  warfarin (COUMADIN) 5 MG tablet TAKE 1/2 TO 1 TABLET BY MOUTH daily OR AS DIRECTED by THE coumadin clinic 12/13/21   Geralynn Rile, MD      Allergies     Ace inhibitors, Amlodipine, Atenolol, Avandia [rosiglitazone], Darvon [propoxyphene], Erythromycin, Hydralazine, Hydrocodone, Levofloxacin, Morphine and related, Percocet [oxycodone-acetaminophen], Spironolactone, and Tramadol    Review of Systems   Review of Systems  Constitutional:  Negative for diaphoresis, fatigue and fever.  HENT:  Negative for trouble swallowing.   Respiratory:  Negative for cough and shortness of breath.   Cardiovascular:  Positive for chest pain. Negative for palpitations, orthopnea, claudication, syncope, PND and near-syncope.  Gastrointestinal:  Negative for abdominal pain, anorexia, heartburn, nausea and vomiting.  Musculoskeletal:  Negative for back pain.  Neurological:  Negative for dizziness, weakness, numbness and headaches.   Physical Exam Updated Vital Signs BP 128/79    Pulse 83    Temp 98.5 F (36.9 C) (Oral)    Resp (!) 23  SpO2 97%  Physical Exam Vitals and nursing note reviewed.  Constitutional:      General: She is not in acute distress.    Appearance: She is well-developed. She is not ill-appearing.  HENT:     Head: Normocephalic and atraumatic.  Eyes:     Extraocular Movements: Extraocular movements intact.     Conjunctiva/sclera: Conjunctivae normal.     Pupils: Pupils are equal, round, and reactive to light.  Cardiovascular:     Rate and Rhythm: Normal rate and regular rhythm.     Pulses:          Radial pulses are 2+ on the right side and 2+ on the left side.     Heart sounds: Normal heart sounds. No murmur heard. Pulmonary:     Effort: Pulmonary effort is normal. No respiratory distress.     Breath sounds: Normal breath sounds. No decreased breath sounds, wheezing or rhonchi.  Abdominal:     Palpations: Abdomen is soft.     Tenderness: There is no abdominal tenderness.  Musculoskeletal:        General: No swelling. Normal range of motion.     Cervical back: Normal range of motion and neck supple.     Right lower leg: No edema.      Left lower leg: No edema.  Skin:    General: Skin is warm and dry.     Capillary Refill: Capillary refill takes less than 2 seconds.  Neurological:     General: No focal deficit present.     Mental Status: She is alert.  Psychiatric:        Mood and Affect: Mood normal.    ED Results / Procedures / Treatments   Labs (all labs ordered are listed, but only abnormal results are displayed) Labs Reviewed  BASIC METABOLIC PANEL - Abnormal; Notable for the following components:      Result Value   Potassium 3.4 (*)    Glucose, Bld 156 (*)    BUN 43 (*)    Creatinine, Ser 2.12 (*)    GFR, Estimated 23 (*)    All other components within normal limits  PROTIME-INR - Abnormal; Notable for the following components:   Prothrombin Time 29.6 (*)    INR 2.8 (*)    All other components within normal limits  BRAIN NATRIURETIC PEPTIDE - Abnormal; Notable for the following components:   B Natriuretic Peptide 548.1 (*)    All other components within normal limits  TROPONIN I (HIGH SENSITIVITY) - Abnormal; Notable for the following components:   Troponin I (High Sensitivity) 85 (*)    All other components within normal limits  RESP PANEL BY RT-PCR (FLU A&B, COVID) ARPGX2  CBC  D-DIMER, QUANTITATIVE  TROPONIN I (HIGH SENSITIVITY)    EKG EKG Interpretation  Date/Time:  Friday December 24 2021 10:00:02 EST Ventricular Rate:  91 PR Interval:  260 QRS Duration: 88 QT Interval:  376 QTC Calculation: 462 R Axis:   31 Text Interpretation: Aflutter Low voltage QRS Nonspecific ST and T wave abnormality Abnormal ECG When compared with ECG of 02-Apr-2021 13:20, PREVIOUS ECG IS PRESENT Confirmed by Lennice Sites ([id number]) on 12/24/2021 10:49:56 AM  Radiology DG Chest 2 View  Result Date: 12/24/2021 CLINICAL DATA:  Chest pain EXAM: CHEST - 2 VIEW COMPARISON:  11/08/2021 FINDINGS: Bilateral mild interstitial thickening. No pleural effusion or pneumothorax. Stable cardiomegaly. No acute osseous  abnormality. Right shoulder arthroplasty. IMPRESSION: 1. Cardiomegaly with mild pulmonary vascular congestion. Electronically Signed  By: Kathreen Devoid M.D.   On: 12/24/2021 10:41    Procedures Procedures    Medications Ordered in ED Medications - No data to display  ED Course/ Medical Decision Making/ A&P                           Medical Decision Making Amount and/or Complexity of Data Reviewed Labs: ordered. Radiology: ordered.   [name] is here with chest pain.  History of cardiac amyloidosis, atrial thrombus on Coumadin, heart failure, nonobstructive CAD.  Normal vitals.  No fever.  Chest pain on and off for the last week.  Persistent for the last 4 hours this morning.  Minimal relief with nitroglycerin.  Overall she states chest pain has improved and she is more comfortable now.  Pain was radiating to the left shoulder and left side of the neck.  She had COVID several weeks ago but denies any cough or sputum production.  No specific shortness of breath.  No evidence of volume overload on exam.  She feels like she is mostly at her dry weight.  She does have chronic kidney disease as well.  Primary care doctor is concerned for blood clot given recent COVID.  She is on Coumadin.  Her INR has been at goal per review of her chart.  Differential diagnosis includes acute coronary syndrome versus pneumonia versus less likely PE, pericarditis/myocarditis/amyloid process.  Will check CBC, BMP, troponin, BNP, chest x-ray, D-dimer.  EKG per my review and interpretation shows atrial flutter rate controlled.  No ischemic process.  Lab work upon my review and interpretation shows slightly elevated troponin from baseline at 85.  BNP 548.  COVID and flu test negative.  INR is at goal at 2.8.  D-dimer is negative.  Chest x-ray per my review and interpretation shows no pneumonia or pneumothorax.  Patient has no significant anemia or leukocytosis.  Creatinine is at baseline.  Overall I have no  suspicion for pulmonary embolism.  Given her history I am concerned for possible anginal type symptoms.  Troponin elevated above her baseline.  Chest pain has improved but somewhat concerning story for ACS.  Will trend troponin.  Cardiology has been consulted to come down to the ED to evaluate the patient for possible admission.  This chart was dictated using voice recognition software.  Despite best efforts to proofread,  errors can occur which can change the documentation meaning.         Final Clinical Impression(s) / ED Diagnoses Final diagnoses:  Chest pain, unspecified type    Rx / DC Orders ED Discharge Orders     None         Lennice Sites, DO 12/24/21 1244

## 2021-12-24 NOTE — ED Notes (Signed)
Patient taken for Echo ?

## 2021-12-24 NOTE — Assessment & Plan Note (Addendum)
Repeat 2D echo from 12/24/2021 showed LV ejection fraction of 40 to 45%.  Concentric LVH noted, currently on IV Lasix 80 twice daily.  We will continue diuresis.  Continue Lipitor, aspirin, metoprolol.  Continue CHF protocol.  Cardiology on board.  Will follow recommendations.  Patient is negative balance for 1281 ML.

## 2021-12-24 NOTE — Assessment & Plan Note (Addendum)
History of CAD.  Last catheterization on 2016 showed moderate RCA lesion and LAD lesion.  Intermittent chest pressure for 1 week with some pleuritic component.  Chest x-ray with vascular congestion.  Mild elevated troponins with renal dysfunction .EKG not indicative of acute ischemia.    Continue aspirin.  ESR mildly elevated at 83.  2D echocardiogram with LV ejection fraction of 40 to 45% with global hypokinesis.  Seen by cardiology and underwent right sided cardiac catheterization with notable elevated pulmonary artery pressure.  Continue diuresis.  Further management plan as per cardiology.  Patient continues to feel tightness.  Essential HTN -Continue Toprol blood pressure seems to be better.  Hyperlipidemia -Continue Lipitor  DM type II. -Recent hemoglobin A1c was 7.6,, continue sliding scale insulin, long-acting insulin.  Closely monitor.

## 2021-12-24 NOTE — Assessment & Plan Note (Signed)
Continue CPAP.  

## 2021-12-24 NOTE — Assessment & Plan Note (Addendum)
Continue Synthroid °

## 2021-12-24 NOTE — Telephone Encounter (Signed)
Patient called triage to report chest pain for "several days". Patient describes the chest pain as pressure that starts under left breast and goes across chest and shoulder. She denies nausea. BP 145/75, P 92. She took 3 NTG five minutes apart without any relief. BP 103/64, P 93. Recommended that patient go to the ED to be evaluated. She said she would have her granddaughter take her to the ED. Although she felt the peppery sensation under her tongue with the ngt, the bottle exp. date is 08-10-21. I sent in a refill to her pharmacy. ?

## 2021-12-24 NOTE — Telephone Encounter (Signed)
Pt c/o of Chest Pain: STAT if CP now or developed within 24 hours ? ?1. Are you having CP right now? yes ? ?2. Are you experiencing any other symptoms (ex. SOB, nausea, vomiting, sweating)? Hurting in her chest, top of her chest over her breast, sometimes she feels it in her neck and her shoulder. It's not a sharp pain in her chest but she feels a pain.  States when she sits down she can feels it more.  ? ?3. How long have you been experiencing CP? Several days ? ?4. Is your CP continuous or coming and going? continuous ? ?5. Have you taken Nitroglycerin? no ??  ?

## 2021-12-24 NOTE — Progress Notes (Signed)
Home CPAP/cords inspected by this RT for any damage or frays, and set up on rolling table at bedside for pt's use. Pt has family member at bedside assisting with CPAP as well. Advised pt to notify for RT if any further assistance is needed with machine. RT will monitor as needed.  ?

## 2021-12-24 NOTE — Progress Notes (Addendum)
Received patient from ED via stretcher.  Patient is able to ambulate to the room and bed with minimal assist.  Oriented to room and unit routine.  Daughter at bedside.  Call bell within reach.  Needs addressed. ?

## 2021-12-24 NOTE — Progress Notes (Signed)
?  Echocardiogram ?2D Echocardiogram has been performed. ? ?Regina [name] ?12/24/2021, 5:18 PM ?

## 2021-12-24 NOTE — Progress Notes (Signed)
ANTICOAGULATION CONSULT NOTE - Initial Consult ? ?Pharmacy Consult for warfarin ?Indication:  VTE treatment ? ?Allergies  ?Allergen Reactions  ? Ace Inhibitors Other (See Comments)  ?  unknown  ? Amlodipine Other (See Comments)  ?  unknown  ? Atenolol Other (See Comments)  ?  bradycardia  ? Avandia [Rosiglitazone] Other (See Comments)  ?  unknown  ? Darvon [Propoxyphene] Other (See Comments)  ?  unknown  ? Erythromycin Itching  ? Hydralazine Other (See Comments)  ?  Burning in throat and chest  ? Hydrocodone Other (See Comments)  ?  Hallucinations.  ? Levofloxacin Itching  ? Morphine And Related Other (See Comments)  ?  Dizzy and hallucianation, vomiting; ?Willing to try low dose  ? Percocet [Oxycodone-Acetaminophen] Other (See Comments)  ?  hallucination  ? Spironolactone Other (See Comments)  ?  unknown  ? Tramadol Other (See Comments)  ?  Unknown/does not recall reaction but does not want to take again  ? ? ?Patient Measurements: ?  ?Heparin Dosing Weight: 74.8 kg ? ?Vital Signs: ?Temp: 98.5 ?F (36.9 ?C) (03/10 1011) ?Temp Source: Oral (03/10 1011) ?BP: 148/82 (03/10 1445) ?Pulse Rate: 77 (03/10 1615) ? ?Labs: ?Recent Labs  ?  12/24/21 ?1022 12/24/21 ?1315  ?HGB 12.3  --   ?HCT 39.5  --   ?PLT 275  --   ?LABPROT 29.6*  --   ?INR 2.8*  --   ?CREATININE 2.12*  --   ?TROPONINIHS 85* 79*  ? ? ?Estimated Creatinine Clearance: 24.6 mL/min (A) (by C-G formula based on SCr of 2.12 mg/dL (H)). ? ? ?Medical History: ?Past Medical History:  ?Diagnosis Date  ? Back pain   ? CHF (congestive heart failure) ([place])   ? 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 Chronic combined systolic and diastolic CHF (congestive heart failure) ([place])   ? Diabetes mellitus without complication ([place])   ? GERD (gastroesophageal reflux disease)   ? Heart disease   ? Hypertension   ? Hypothyroidism   ? Joint pain   ? Mini stroke   ? Non-obstructive CAD   ? a. 01/2015 Cardiolite: + inf wall ischemia, EF 56%;  b. 01/2015 Cath: LM nl, LAD 60m  LCX min irregs, RCA dominant, 50-632m ? Sleep apnea   ? Swallowing difficulty   ? ? ?Medications:  ?(Not in a hospital admission)  ?Scheduled:  ? [START ON 12/25/2021] allopurinol  100 mg Oral q morning  ? aspirin EC  81 mg Oral Daily  ? atorvastatin  20 mg Oral QHS  ? docusate sodium  100 mg Oral BID  ? famotidine  20 mg Oral QHS  ? furosemide  80 mg Intravenous BID  ? gabapentin  600 mg Oral BID  ? insulin aspart  0-15 Units Subcutaneous TID WC  ? insulin aspart  0-5 Units Subcutaneous QHS  ? [START ON 12/25/2021] insulin glargine (1 Unit Dial)  26 Units Subcutaneous Daily  ? [START ON 12/25/2021] levothyroxine  150 mcg Oral Q0600  ? [START ON 12/25/2021] magnesium oxide  400 mg Oral q morning  ? metoprolol succinate  25 mg Oral Daily  ? sodium chloride flush  3 mL Intravenous Q12H  ? Tafamidis  61 mg Oral Daily  ? ?Infusions:  ? ?Assessment: ?PMH of cardiac amyloidosis with chronic combined CHF, DM, HTN, hypothyroidism, CAD. Patient on warfarin for atrial thrombus. Patient presented with chest pain, heaviness, and pressure. Last outpatient coag visit was 11/23/21 with INR of 2.6. INR today is  therapeutic at 2.8. CBC stable. Patient reported last dose 12/23/21.  ? ?PTA regimen: warfarin 5 mg tablets - take 1/2 tablet daily except 1 tablet on Sundays. ? ?Goal of Therapy:  ?INR 2-3 ?  ?Plan:  ?Give warfarin 2.5 mg tonight. If stable consider scheduling home regimen. ?Daily INR and CBC ? ?Thank you for allowing pharmacy to participate in this patient's care. ? ?Reatha Harps, PharmD ?[id number] Pharmacy Resident ?12/24/2021 5:36 PM ?Check [email or web address] for unit specific pharmacy number ? ? ? ?

## 2021-12-24 NOTE — Assessment & Plan Note (Addendum)
Latest creatinine of 2.1.  On IV Lasix.  Continue to monitor renal function.

## 2021-12-24 NOTE — Consult Note (Signed)
Cardiology Consultation:   Patient ID: Regina [name]; [id number]; 07/16/1941   Admit date: 12/24/2021 Date of Consult: 12/24/2021  Primary Care Provider: [name], [name], MD Primary Cardiologist: Evalina Field, MD   Patient Profile:   Regina [name] is a 81 y.o. female with a hx of persistent afib, LAA thrombus, cardiac amyloidosis/HFpEF, DM, CKD stage IV, hypothyroidism who is being seen today for the evaluation of chest pain at the request of Dr. Lennice Sites, DO.  History of Present Illness:   Regina [name] presents for chest pain that has been ongoing for the past week. First noticed this pain while laying down. States she also has been feeling more short of breath with walking, going up the stairs, and cooking. Feels in the last week this has gotten noticeably worse. Describes chest pain as a tightness across the chest with radiation to her left neck and left arm. She has been compliant with her medications, no increased salt intake, or missed doses. She feels LE swelling is at baseline, does have chronic abdominal bloating but feels this is no worse than usual. Feel her weight hay have increased but does not have scale at home.  She was more concerned as her symptoms have not improved and so she called PCP who advised she be evaluated for PE given recent COVID history. She had a telehealth visit on 12/02/2021 after testing positive for COVID-19, was having some runny nose and Son had recently tested positive. She completed a course of Legevrio, and denies having any other symptoms aside from the rhinorrhea.   In ED patient was afebrile with HR of 73, normotensive, saturating 100% on room air. Sun 43, Cr 2.1 (baseline 1.9-2.2), K 304, troponin 85>79, INR 2.8, ddimer 0.43, BNP 548 9. EKG with aflutter with rate of 90 and low voltage. CXR with cardiomegaly with mild pulmonary vascular congestion.   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 01/2015 Echo: EF 55-60%, Gr 2 DD, mod LVH, mildly dil LA.   Heart disease    Hypertension    Hypothyroidism    Joint pain    Kidney problem    Lower extremity edema    Mini stroke    Non-obstructive CAD    a. 01/2015 Cardiolite: + inf wall ischemia, EF 56%;  b. 01/2015 Cath: LM nl, LAD 57m LCX min irregs, RCA dominant, 50-653m  Sleep apnea    Swallowing difficulty    Thyroid disease     Past Surgical History:  Procedure Laterality Date   ABDOMINAL HYSTERECTOMY     BUBBLE STUDY  07/01/2020   Procedure: BUBBLE STUDY;  Surgeon: AcElouise MunroeMD;  [facility]: Cardi[facility];;   BUBBLE STUDY  08/05/2020   Procedure: BUBBLE STUDY;  Surgeon: O'Geralynn RileMD;  [facility] Service: Cardiovascular;;  done with definity    CARDIAC CATHETERIZATION     ESOPHAGOGASTRODUODENOSCOPY     a. 01/2015 EGD: patent esophagus.   ESOPHAGOGASTRODUODENOSCOPY N/A 01/20/2015   Procedure: ESOPHAGOGASTRODUODENOSCOPY (EGD);  Surgeon: PaCarol AdaMD;  [facility];  Service: Endoscopy;  Laterality: N/A;   HAND  SURGERY     JOINT REPLACEMENT     reports history bilateral TKA and right TSA   LEFT HEART CATHETERIZATION WITH CORONARY ANGIOGRAM N/A 01/19/2015   TEE WITHOUT CARDIOVERSION  05/04/2020   TEE WITHOUT CARDIOVERSION N/A 05/05/2020   Procedure: TRANSESOPHAGEAL ECHOCARDIOGRAM (TEE);  Surgeon: Pixie Casino, MD;  Location: [facility] ENDOSCOPY;  Service: Cardiovascular;  Laterality: N/A;   TEE WITHOUT CARDIOVERSION N/A 07/01/2020   Procedure: TRANSESOPHAGEAL ECHOCARDIOGRAM (TEE);  Surgeon: Elouise Munroe, MD;  Location: [place];  Service: Cardiovascular;  Laterality: N/A;   TEE  WITHOUT CARDIOVERSION N/A 08/05/2020   Procedure: TRANSESOPHAGEAL ECHOCARDIOGRAM (TEE);  Surgeon: Geralynn Rile, MD;  Location: [place];  Service: Cardiovascular;  Laterality: N/A;   THYROIDECTOMY       Home Medications:  Prior to Admission medications   Medication Sig Start Date End Date Taking? Authorizing Provider  Alcohol Swabs (B-D SINGLE USE SWABS REGULAR) PADS Use to test blood sugar up to 3 times daily 01/11/21   Shamleffer, Melanie Crazier, MD  allopurinol (ZYLOPRIM) 100 MG tablet TAKE ONE TABLET BY MOUTH EVERY MORNING 11/11/21   [name], [name], MD  atorvastatin (LIPITOR) 20 MG tablet TAKE ONE TABLET BY MOUTH EVERYDAY AT BEDTIME 12/13/21   [name], [name], MD  Blood Glucose Monitoring Suppl (ACCU-CHEK GUIDE) w/Device KIT 1 Device by Does not apply route 3 (three) times daily. 06/10/19   [name], [name], MD  Capsaicin 0.033 % CREA Apply 1 application topically 3 (three) times daily. 05/14/21   Shamleffer, Melanie Crazier, MD  Cholecalciferol (VITAMIN D3) 50 MCG (2000 UT) capsule Take 2,000 Units by mouth daily.    [provider]  COMFORT EZ PEN NEEDLES 31G X 8 MM MISC USE 3 TO 4 times daily as directed 10/14/21   Shamleffer, Melanie Crazier, MD  Continuous Blood Gluc Sensor (DEXCOM G6 SENSOR) MISC 1 Device by Does not apply route as directed. 01/08/21   Shamleffer, Melanie Crazier, MD  Continuous Blood Gluc Transmit (DEXCOM G6 TRANSMITTER) MISC 1 Device by Does not apply route as directed. 01/08/21   Shamleffer, Melanie Crazier, MD  diclofenac Sodium (VOLTAREN) 1 % GEL Apply 1 application topically daily as needed (arthritis pain/hands).    [provider]  famotidine (PEPCID) 20 MG tablet Take 1 tablet (20 mg total) by mouth at bedtime. 04/14/21   [name], [name], MD  FARXIGA 10 MG TABS tablet Take 1 tablet (10 mg total) by mouth daily. 04/23/21   O'NealCassie Freer, MD  gabapentin (NEURONTIN) 600 MG tablet TAKE ONE TABLET BY MOUTH EVERY MORNING and TAKE ONE  TABLET BY MOUTH EVERYDAY AT BEDTIME 10/13/21   [name], [name], MD  glucose blood (ACCU-CHEK GUIDE) test strip Use to test blood sugar up to 3 times daily 04/21/21   Shamleffer, Melanie Crazier, MD  insulin glargine, 1 Unit Dial, (TOUJEO SOLOSTAR) 300 UNIT/ML Solostar Pen Inject 26 Units into the skin daily. Eat a snack with protein nightly before bedtime. 04/21/21   Shamleffer, Melanie Crazier, MD  insulin lispro (HUMALOG KWIKPEN) 100 UNIT/ML KwikPen Max daily 15 units Patient taking differently: 5 Units 3 (three) times daily. Max daily 15 units 04/21/21   Shamleffer, Melanie Crazier, MD  Insulin Pen Needle 32G X 4 MM MISC 1 Device by Does not apply route in the morning, at noon, in the evening, and at bedtime. 01/11/21   Shamleffer, Melanie Crazier, MD  ipratropium (ATROVENT) 0.06 % nasal spray Place 2 sprays into both nostrils 4 (four) times daily. 09/14/21  [name], [name], MD  Lancets Misc. (ACCU-CHEK FASTCLIX LANCET) KIT Use to test blood sugar up to 3 times daily 04/21/21   Shamleffer, Melanie Crazier, MD  levothyroxine (SYNTHROID) 150 MCG tablet TAKE ONE TABLET BY MOUTH BEFORE BREAKFAST 10/13/21   [name], [name], MD  magnesium oxide (MAG-OX) 400 MG tablet TAKE ONE TABLET BY MOUTH EVERY MORNING 11/11/21   [name], [name], MD  meclizine (ANTIVERT) 25 MG tablet Take 25 mg by mouth daily as needed for dizziness. 10/01/14   [provider]  metoprolol succinate (TOPROL-XL) 25 MG 24 hr tablet Take 1 tablet (25 mg total) by mouth daily. Take with or immediately following a meal. 07/12/21   O'Neal, Cassie Freer, MD  nitroGLYCERIN (NITROSTAT) 0.4 MG SL tablet Place 1 tablet under tongue for chest pain. Repeat every five minutes 2 more times if needed for a total of 3 tablets within 15 minutes. 12/24/21   O'NealCassie Freer, MD  Polyethylene Glycol 3350 (MIRALAX PO) Take by mouth daily as needed.    [provider]  Propylene Glycol (SYSTANE BALANCE OP) Place 1 drop into both eyes daily as  needed (for dry eyes).    [provider]  Tafamidis 61 MG CAPS Take 61 mg by mouth daily. 08/19/21   O'NealCassie Freer, MD  tiZANidine (ZANAFLEX) 4 MG tablet Take 0.5-1 tablets (2-4 mg total) by mouth at bedtime as needed for muscle spasms. 02/15/21   [name], [name], MD  torsemide ([place]) 20 MG tablet TAKE TWO TABLETS BY MOUTH EVERY MORNING Ok to take extra dose if weight gain of 1lb in 1 day or 5 lbs in 7 days 10/13/21   O'Neal, Cassie Freer, MD  trolamine salicylate (ASPERCREME) 10 % cream Apply 1 application topically as needed for muscle pain.    [provider]  warfarin (COUMADIN) 5 MG tablet TAKE 1/2 TO 1 TABLET BY MOUTH daily OR AS DIRECTED by THE coumadin clinic 12/13/21   O'Neal, Cassie Freer, MD    Inpatient Medications: Scheduled Meds:  Continuous Infusions:  PRN Meds:   Allergies:    Allergies  Allergen Reactions   Ace Inhibitors Other (See Comments)    unknown   Amlodipine Other (See Comments)    unknown   Atenolol Other (See Comments)    bradycardia   Avandia [Rosiglitazone] Other (See Comments)    unknown   Darvon [Propoxyphene] Other (See Comments)    unknown   Erythromycin Itching   Hydralazine Other (See Comments)    Burning in throat and chest   Hydrocodone Other (See Comments)    Hallucinations.   Levofloxacin Itching   Morphine And Related Other (See Comments)    Dizzy and hallucianation, vomiting; Willing to try low dose   Percocet [Oxycodone-Acetaminophen] Other (See Comments)    hallucination   Spironolactone Other (See Comments)    unknown   Tramadol Other (See Comments)    Unknown/does not recall reaction but does not want to take again    Social History:   Social History   Socioeconomic History   Marital status: Widowed    Spouse name: Not on file   Number of children: Not on file   Years of education: Not on file   Highest education level: Not on file  Occupational History   Occupation: Retired  Tobacco Use    Smoking status: Never   Smokeless tobacco: Never  Vaping Use   Vaping Use: Never used  Substance and Sexual Activity   Alcohol use: No  Drug use: Not on file   Sexual activity: Not Currently  Other Topics Concern   Not on file  Social History Narrative   Lives with daughter   Social Determinants of Health   Financial Resource Strain: Low Risk    Difficulty of Paying Living Expenses: Not hard at all  Food Insecurity: No Food Insecurity   Worried About Charity fundraiser in the Last Year: Never true   Arboriculturist in the Last Year: Never true  Transportation Needs: No Transportation Needs   Lack of Transportation (Medical): No   Lack of Transportation (Non-Medical): No  Physical Activity: Inactive   Days of Exercise per Week: 0 days   Minutes of Exercise per Session: 0 min  Stress: No Stress Concern Present   Feeling of Stress : Not at all  Social Connections: Moderately Isolated   Frequency of Communication with Friends and Family: More than three times a week   Frequency of Social Gatherings with Friends and Family: Twice a week   Attends Religious Services: More than 4 times per year   Active Member of [employer] or Organizations: No   Attends Archivist Meetings: Never   Marital Status: Widowed  Human resources officer Violence: Not At Risk   Fear of Current or Ex-Partner: No   Emotionally Abused: No   Physically Abused: No   Sexually Abused: No    Family History:   Family History  Problem Relation Age of Onset   Heart disease Mother    Hypertension Mother    Cancer Father    Alcoholism Father   Heart disease in mother, broth also with cardiac amyloidosis    ROS:  Please see the history of present illness.  Review of Systems  Constitutional:  Negative for chills and fever.  HENT:  Negative for congestion and sore throat.   Respiratory:  Positive for shortness of breath. Negative for cough and hemoptysis.   Cardiovascular:  Positive for chest pain  (tightness) and orthopnea. Negative for leg swelling.  Gastrointestinal:  Positive for abdominal pain. Negative for diarrhea, nausea and vomiting.  Genitourinary:  Negative for dysuria and hematuria.  Musculoskeletal:  Negative for back pain and joint pain.  Skin:  Negative for itching and rash.  Neurological:  Negative for dizziness, sensory change and weakness.  Psychiatric/Behavioral:  Negative for depression. The patient is not nervous/anxious.    All other ROS reviewed and negative.     Physical Exam/Data:   Vitals:   12/24/21 1215 12/24/21 1230 12/24/21 1245 12/24/21 1300  BP: 126/69 128/79 130/76 129/80  Pulse: 82 83 81 82  Resp: 20 (!) 23 (!) 23 (!) 23  Temp:      TempSrc:      SpO2: 99% 97% 100% 100%   No intake or output data in the 24 hours ending 12/24/21 1312 There were no vitals filed for this visit. There is no height or weight on file to calculate BMI.  General:  Well nourished, well developed, in no acute distress, obese HEENT: normal Lymph: no adenopathy Neck: JVD up to the jaw Endocrine:  No thryomegaly Vascular: No carotid bruits; DP and radial pulses 2+ bilaterally Cardiac:  regular, S1, S2; no murmur, rubs or gallops Lungs:  clear to auscultation bilaterally, no wheezing, rhonchi or rales  Abd: soft, nontender, bowel sounds normal Ext: 1+ pitting edema bilaterally Musculoskeletal:  No deformities, moves all limbs independently Skin: warm and dry  Neuro:  no focal abnormalities noted Psych:  Normal  affect   EKG:  The EKG was personally reviewed and demonstrates:  aflutter with rate 91  Relevant CV Studies: CXR 3/10/023 FINDINGS: Bilateral mild interstitial thickening. No pleural effusion or pneumothorax. Stable cardiomegaly. No acute osseous abnormality. Right shoulder arthroplasty.   IMPRESSION: 1. Cardiomegaly with mild pulmonary vascular congestion.    TTE 04/21/2021  1. Left ventricular ejection fraction, by estimation, is 45 to 50%. The   left ventricle has mildly decreased function. The left ventricle  demonstrates global hypokinesis. There is moderate concentric left  ventricular hypertrophy. Left ventricular  diastolic function could not be evaluated.   2. Right ventricular systolic function is moderately reduced. The right  ventricular size is normal. Tricuspid regurgitation signal is inadequate  for assessing PA pressure.   3. Left atrial size was moderately dilated.   4. The mitral valve is grossly normal. Mild mitral valve regurgitation.  No evidence of mitral stenosis.   5. The aortic valve is tricuspid. Aortic valve regurgitation is not  visualized. No aortic stenosis is present.   6. The inferior vena cava is normal in size with greater than 50%  respiratory variability, suggesting right atrial pressure of 3 mmHg  Laboratory Data:  Chemistry Recent Labs  Lab 12/24/21 1022  NA 139  K 3.4*  CL 99  CO2 31  GLUCOSE 156*  BUN 43*  CREATININE 2.12*  CALCIUM 9.1  GFRNONAA 23*  ANIONGAP 9    No results for input(s): PROT, ALBUMIN, AST, ALT, ALKPHOS, BILITOT in the last 168 hours. Hematology Recent Labs  Lab 12/24/21 1022  WBC 6.8  RBC 4.32  HGB 12.3  HCT 39.5  MCV 91.4  MCH 28.5  MCHC 31.1  RDW 14.2  PLT 275   Cardiac EnzymesNo results for input(s): TROPONINI in the last 168 hours. No results for input(s): TROPIPOC in the last 168 hours.  BNP Recent Labs  Lab 12/24/21 1123  BNP 548.1*    DDimer  Recent Labs  Lab 12/24/21 1036  DDIMER 0.43    Radiology/Studies:  DG Chest 2 View  Result Date: 12/24/2021 CLINICAL DATA:  Chest pain EXAM: CHEST - 2 VIEW COMPARISON:  11/08/2021 FINDINGS: Bilateral mild interstitial thickening. No pleural effusion or pneumothorax. Stable cardiomegaly. No acute osseous abnormality. Right shoulder arthroplasty. IMPRESSION: 1. Cardiomegaly with mild pulmonary vascular congestion. Electronically Signed   By: Kathreen Devoid M.D.   On: 12/24/2021 10:41     Assessment and Plan:  Acute on chronic systolic heart failure exacerbation Hereditary cardiac amyloidosis - Patient presents with orthopnea and dyspnea on exertion.  - Last echo from July 2021 with EF of 45-50%, LVH, LA dilation, moderately reduced RV systolic function - CXR with pulmonary vascular congestion, elevated BNP 548 - troponin 85>79 likely demand ischemia in setting of volume overload, EKG with atrial flutter with rate 90 and low voltage - Weight at pulmonology clinic 227 lbs on 12/14/2021, no weight in the ED, appears volume overloaded  - Low suspicion for PE, ddimer negative - Lasix IV 80 mg BID, monitor I/os - check echocardiogram - ESR  - continue Tafamidis 61 mg daily - continue farxiga -Not a candidate for ACE/ARB/Arni/MRA given CKD stage IV.   Persistent atrial fibrillation Persistent left atrial appendage thrombus -On coumadin, INR 2.8  - rate control with metoprolol succinate 25 mg daily - Not a candidate for cardioversion given persistent left atrial appendage thrombus (developed on eliquis)   CAD - Left heart cath in 2016 in [place], [place] with nonobstructive CAD 50% RCA and  50% LAD - Lipid panel from 08/13/2021 with cholesterol 115, trig 101, HDL 54, LDL 40 - Continue atorvastatin 20 mg daily - If she continue to have chest pain after diuresis would consider ischemic workup, although would like to avoid cath given her CKD  CKD IV Cr 2.1 today with BUN 43, see Dr. Joylene Grapes as outpatient, kidney function near baseline, continue to monitor  Time Spent Directly with Patient:  Length of Stay:  LOS: 0 days   Fidela Salisbury, PGY-2 12/24/2021, 1:12 PM   For questions or updates, please contact [facility] Please consult [email or web address] for contact info under Cardiology/STEMI.

## 2021-12-24 NOTE — Assessment & Plan Note (Addendum)
Continue beta-blocker and Coumadin.  Continue to monitor INR.  Rate controlled at this time.

## 2021-12-24 NOTE — Assessment & Plan Note (Signed)
-  Continue Tafamidis ?

## 2021-12-24 NOTE — Telephone Encounter (Signed)
Called patient back, she is currently at the ED. I advised her I would try to monitor her visit, and when they discharged her so we could try to get her an appointment to see [place] to discuss other medication, and hospital follow up. Patient will call to let me know when they send her home as well.  ? ? ?

## 2021-12-24 NOTE — ED Triage Notes (Signed)
Patient coming from home, compliant of chest pain for 1 week. Endorses covid a few weeks ago. PCP worried for blood clots. VSS. ?

## 2021-12-24 NOTE — H&P (Signed)
History and Physical    Patient: Regina [name] JKD:[id number] DOB: [birth year] DOA: 12/24/2021 DOS: the patient was seen and examined on 12/24/2021 PCP: [name], [name], MD  Patient coming from: Home - lives with son; NOK: Miyoko, Hashimi, [phone]   Chief Complaint: Chest pain  HPI: Regina [name] is a 81 y.o. female with medical history significant of cardiac amyloidosis with chronic combined CHF; DM; HTN; hypothyroidism; CAD; atrial thrombus on Coumadin; and OSA presenting with chest pain.   She has been having chest pain, heaviness, pressure.  It has been happening all week but she could no longer ignore it.  It was just hurting too much and she didn't want to take a chance.  It is still hurting and she has not been given anything for this.  She did take 3 NTG at home without improvement.  Last stress was in 2021.  No LE edema.  Mild DOE.    ER Course:  CP x 1 week.  Elevated troponin.  Cardiology is following and requests [facility] admission.  COVID recently but test today is negative.     Review of Systems: As mentioned in the history of present illness. All other systems reviewed and are negative. 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 01/2015 Cardiolite: + inf wall ischemia, EF 56%;  b. 01/2015 Cath: LM nl, LAD 58m LCX min irregs, RCA dominant, 50-666m  Sleep apnea    Swallowing difficulty    Past Surgical History:  Procedure Laterality Date   ABDOMINAL HYSTERECTOMY     BUBBLE STUDY  07/01/2020   Procedure: BUBBLE STUDY;  Surgeon: AcElouise MunroeMD;  [facility]  Service: Cardiovascular;;   BUBBLE STUDY  08/05/2020   Procedure: BUBBLE STUDY;  Surgeon: O'Geralynn RileMD;  [facility] Service: Cardiovascular;;  done with definity    CARDIAC CATHETERIZATION     ESOPHAGOGASTRODUODENOSCOPY     a. 01/2015 EGD: patent esophagus.   ESOPHAGOGASTRODUODENOSCOPY N/A 01/20/2015   Procedure: ESOPHAGOGASTRODUODENOSCOPY (EGD);  Surgeon: PaCarol AdaMD;  [facility]  Service: Endoscopy;  Laterality: N/A;   HAND SURGERY     JOINT REPLACEMENT     reports history bilateral TKA and right TSA   LEFT HEART CATHETERIZATION WITH CORONARY ANGIOGRAM N/A 01/19/2015   TEE WITHOUT CARDIOVERSION  05/04/2020   TEE WITHOUT CARDIOVERSION N/A 05/05/2020   Procedure: TRANSESOPHAGEAL ECHOCARDIOGRAM (TEE);  Surgeon: HiPixie CasinoMD;  [facility];  Service: Cardiovascular;  Laterality: N/A;   TEE WITHOUT CARDIOVERSION N/A 07/01/2020   Procedure: TRANSESOPHAGEAL ECHOCARDIOGRAM (TEE);  Surgeon: AcElouise MunroeMD;  [facility] Service: Cardiovascular;  Laterality: N/A;   TEE WITHOUT CARDIOVERSION N/A 08/05/2020   Procedure: TRANSESOPHAGEAL ECHOCARDIOGRAM (TEE);  Surgeon: O'Geralynn RileMD;  [facility] Service: Cardiovascular;  Laterality: N/A;   THYROIDECTOMY     Social History:  reports that she has never smoked. She has never used smokeless tobacco. She reports that she does not drink alcohol and does not  use drugs.  Allergies  Allergen Reactions   Ace Inhibitors Other (See Comments)    unknown   Amlodipine Other (See Comments)    unknown   Atenolol Other (See Comments)    bradycardia   Avandia [Rosiglitazone] Other (See Comments)    unknown   Darvon [Propoxyphene] Other (See Comments)    unknown   Erythromycin Itching   Hydralazine Other (See Comments)    Burning in throat and chest   Hydrocodone Other (See Comments)    Hallucinations.   Levofloxacin Itching   Morphine And Related Other  (See Comments)    Dizzy and hallucianation, vomiting; Willing to try low dose   Percocet [Oxycodone-Acetaminophen] Other (See Comments)    hallucination   Spironolactone Other (See Comments)    unknown   Tramadol Other (See Comments)    Unknown/does not recall reaction but does not want to take again    Family History  Problem Relation Age of Onset   Heart disease Mother    Hypertension Mother    Cancer Father    Alcoholism Father     Prior to Admission medications   Medication Sig Start Date End Date Taking? Authorizing Provider  Alcohol Swabs (B-D SINGLE USE SWABS REGULAR) PADS Use to test blood sugar up to 3 times daily 01/11/21   Shamleffer, Melanie Crazier, MD  allopurinol (ZYLOPRIM) 100 MG tablet TAKE ONE TABLET BY MOUTH EVERY MORNING 11/11/21   [name], [name], MD  atorvastatin (LIPITOR) 20 MG tablet TAKE ONE TABLET BY MOUTH EVERYDAY AT BEDTIME 12/13/21   [name], [name], MD  Blood Glucose Monitoring Suppl (ACCU-CHEK GUIDE) w/Device KIT 1 Device by Does not apply route 3 (three) times daily. 06/10/19   [name], [name], MD  Capsaicin 0.033 % CREA Apply 1 application topically 3 (three) times daily. 05/14/21   Shamleffer, Melanie Crazier, MD  Cholecalciferol (VITAMIN D3) 50 MCG (2000 UT) capsule Take 2,000 Units by mouth daily.    [provider]  COMFORT EZ PEN NEEDLES 31G X 8 MM MISC USE 3 TO 4 times daily as directed 10/14/21   Shamleffer, Melanie Crazier, MD  Continuous Blood Gluc Sensor (DEXCOM G6 SENSOR) MISC 1 Device by Does not apply route as directed. 01/08/21   Shamleffer, Melanie Crazier, MD  Continuous Blood Gluc Transmit (DEXCOM G6 TRANSMITTER) MISC 1 Device by Does not apply route as directed. 01/08/21   Shamleffer, Melanie Crazier, MD  diclofenac Sodium (VOLTAREN) 1 % GEL Apply 1 application topically daily as needed (arthritis pain/hands).    [provider]  famotidine (PEPCID) 20 MG tablet Take 1 tablet (20 mg total) by mouth at bedtime. 04/14/21    [name], [name], MD  FARXIGA 10 MG TABS tablet Take 1 tablet (10 mg total) by mouth daily. 04/23/21   O'NealCassie Freer, MD  gabapentin (NEURONTIN) 600 MG tablet TAKE ONE TABLET BY MOUTH EVERY MORNING and TAKE ONE TABLET BY MOUTH EVERYDAY AT BEDTIME 10/13/21   [name], [name], MD  glucose blood (ACCU-CHEK GUIDE) test strip Use to test blood sugar up to 3 times daily 04/21/21   Shamleffer, Melanie Crazier, MD  insulin glargine, 1 Unit Dial, (TOUJEO SOLOSTAR) 300 UNIT/ML Solostar Pen Inject 26 Units into the skin daily. Eat a snack with protein nightly before bedtime. 04/21/21   Shamleffer, Melanie Crazier, MD  insulin lispro (HUMALOG KWIKPEN) 100 UNIT/ML KwikPen Max daily 15 units Patient taking differently: 5 Units 3 (three) times daily. Max daily 15 units 04/21/21   Shamleffer, Melanie Crazier,  MD  Insulin Pen Needle 32G X 4 MM MISC 1 Device by Does not apply route in the morning, at noon, in the evening, and at bedtime. 01/11/21   Shamleffer, Melanie Crazier, MD  ipratropium (ATROVENT) 0.06 % nasal spray Place 2 sprays into both nostrils 4 (four) times daily. 09/14/21   [name], [name], MD  Lancets Misc. (ACCU-CHEK FASTCLIX LANCET) KIT Use to test blood sugar up to 3 times daily 04/21/21   Shamleffer, Melanie Crazier, MD  levothyroxine (SYNTHROID) 150 MCG tablet TAKE ONE TABLET BY MOUTH BEFORE BREAKFAST 10/13/21   [name], [name], MD  magnesium oxide (MAG-OX) 400 MG tablet TAKE ONE TABLET BY MOUTH EVERY MORNING 11/11/21   [name], [name], MD  meclizine (ANTIVERT) 25 MG tablet Take 25 mg by mouth daily as needed for dizziness. 10/01/14   [provider]  metoprolol succinate (TOPROL-XL) 25 MG 24 hr tablet Take 1 tablet (25 mg total) by mouth daily. Take with or immediately following a meal. 07/12/21   O'Neal, Cassie Freer, MD  nitroGLYCERIN (NITROSTAT) 0.4 MG SL tablet Place 1 tablet under tongue for chest pain. Repeat every five minutes 2 more times if needed for a total of 3 tablets within 15  minutes. 12/24/21   O'NealCassie Freer, MD  Polyethylene Glycol 3350 (MIRALAX PO) Take by mouth daily as needed.    [provider]  Propylene Glycol (SYSTANE BALANCE OP) Place 1 drop into both eyes daily as needed (for dry eyes).    [provider]  Tafamidis 61 MG CAPS Take 61 mg by mouth daily. 08/19/21   O'NealCassie Freer, MD  tiZANidine (ZANAFLEX) 4 MG tablet Take 0.5-1 tablets (2-4 mg total) by mouth at bedtime as needed for muscle spasms. 02/15/21   [name], [name], MD  torsemide ([place]) 20 MG tablet TAKE TWO TABLETS BY MOUTH EVERY MORNING Ok to take extra dose if weight gain of 1lb in 1 day or 5 lbs in 7 days 10/13/21   O'Neal, Cassie Freer, MD  trolamine salicylate (ASPERCREME) 10 % cream Apply 1 application topically as needed for muscle pain.    [provider]  warfarin (COUMADIN) 5 MG tablet TAKE 1/2 TO 1 TABLET BY MOUTH daily OR AS DIRECTED by THE coumadin clinic 12/13/21   Geralynn Rile, MD    Physical Exam: Vitals:   12/24/21 1400 12/24/21 1415 12/24/21 1445 12/24/21 1615  BP: (!) 150/96 (!) 145/93 (!) 148/82   Pulse: 91 70 87 77  Resp: (!) 21 18 16 18   Temp:      TempSrc:      SpO2: 100% 100% 99% 97%   General:  Appears calm and comfortable and is in NAD Eyes:  PERRL, EOMI, normal lids, iris ENT:  grossly normal hearing, lips & tongue, mmm; some absent dentition Neck:  no LAD, masses or thyromegaly; mild JVD Cardiovascular:  RRR, no m/r/g. No LE edema. CP is mildly reproducible. Respiratory:   CTA bilaterally with no wheezes/rales/rhonchi.  Normal respiratory effort. Abdomen:  soft, NT, ND Skin:  no rash or induration seen on limited exam Musculoskeletal:  grossly normal tone BUE/BLE, good ROM, no bony abnormality Psychiatric:  grossly normal mood and affect, speech fluent and appropriate, AOx3 Neurologic:  CN 2-12 grossly intact, moves all extremities in coordinated fashion   Radiological Exams on Admission: Independently  reviewed - see discussion in A/P where applicable  DG Chest 2 View  Result Date: 12/24/2021 CLINICAL DATA:  Chest pain EXAM: CHEST - 2  VIEW COMPARISON:  11/08/2021 FINDINGS: Bilateral mild interstitial thickening. No pleural effusion or pneumothorax. Stable cardiomegaly. No acute osseous abnormality. Right shoulder arthroplasty. IMPRESSION: 1. Cardiomegaly with mild pulmonary vascular congestion. Electronically Signed   By: Kathreen Devoid M.D.   On: 12/24/2021 10:41    EKG: Independently reviewed.  NSR with rate 91; low voltage; nonspecific ST changes with no evidence of acute ischemia   Labs on Admission: I have personally reviewed the available labs and imaging studies at the time of the admission.  Pertinent labs:    K+ 3.4 Glucose 156 BUN 43/Creatinine 2.12/GFR 23 - stable HS troponin 85, 79; 63 in 03/2021 BNP 548.1; 630.4 in 05/2020 Normal CBC D-dimer 0.43 INR 2.8 COVID/flu negative    Assessment and Plan: * Chest pain with high risk for cardiac etiology -Patient presenting with 1 week of intermittent CP -There is a pleuritic component to the pain -She is at high risk and so observation is reasonable -CXR with vascular congestion; she also has JVD on exam so CHF may be contributing to/causing her pain. -Initial cardiac HS troponin elevated but with negative delta. She also has advanced renal dysfunction which could be contributing. -EKG not indicative of acute ischemia.   -Will plan to place in observation status on telemetry to rule out ACS by overnight observation.  -Start ASA 81 mg daily -Cardiology consultation  HTN -Continue Toprol  HLD -Continue Lipitor  DM -Recent A1c was 7.6, suboptimal -Continue Toujeo (or other glargine) -Hold Farxiga -Will cover with moderate-scale SSI for now  Acute on chronic diastolic CHF (congestive heart failure), NYHA class 3 ([place]) -Patient with known h/o chronic combined CHF presenting with CP but some findings suggestive of  CHF -04/2021 echo with echo 45-50%, up from prior 10-15% in 07/2020 -CXR consistent with vascular congestion -Stable/improved BNP compared with prior -Acute decompensated CHF may be contributing to current presentation -Will request echocardiogram -CHF order set utilized -Will give Lasix 40 mg IV BID -Continue Bray O2 for now prn  Wild-type transthyretin-related (ATTR) amyloidosis (HCC) -Continue Tafamidis  Persistent atrial fibrillation (HCC) -Rate controlled with Toprol XL -Continue Coumadin per pharmacy  CKD (chronic kidney disease) stage 4, GFR 15-29 ml/min (HCC) -Appears to be stable at this time -If she is not already established with nephrology and preparing for discussions about HD, this is reasonable -Continue Farxiga as an outpatient  OSA on CPAP -Continue CPAP  Hypothyroidism -Continue Synthroid at current dose     Advance Care Planning:   Code Status: Full Code   Consults: Cardiology; CHF navigator; TOC team  DVT Prophylaxis: Coumadin  Family Communication: Daughter was present throughout evaluation  Severity of Illness: The appropriate patient status for this patient is OBSERVATION. Observation status is judged to be reasonable and necessary in order to provide the required intensity of service to ensure the patient's safety. The patient's presenting symptoms, physical exam findings, and initial radiographic and laboratory data in the context of their medical condition is felt to place them at decreased risk for further clinical deterioration. Furthermore, it is anticipated that the patient will be medically stable for discharge from the hospital within 2 midnights of admission.   Author: Karmen Bongo, MD 12/24/2021 4:52 PM  For on call review [email or web address].

## 2021-12-25 DIAGNOSIS — I10 Essential (primary) hypertension: Secondary | ICD-10-CM | POA: Diagnosis not present

## 2021-12-25 DIAGNOSIS — N184 Chronic kidney disease, stage 4 (severe): Secondary | ICD-10-CM | POA: Diagnosis not present

## 2021-12-25 DIAGNOSIS — R079 Chest pain, unspecified: Secondary | ICD-10-CM | POA: Diagnosis not present

## 2021-12-25 DIAGNOSIS — I5033 Acute on chronic diastolic (congestive) heart failure: Secondary | ICD-10-CM | POA: Diagnosis not present

## 2021-12-25 DIAGNOSIS — E876 Hypokalemia: Secondary | ICD-10-CM

## 2021-12-25 DIAGNOSIS — I513 Intracardiac thrombosis, not elsewhere classified: Secondary | ICD-10-CM

## 2021-12-25 LAB — GLUCOSE, CAPILLARY
Glucose-Capillary: 76 mg/dL (ref 70–99)
Glucose-Capillary: 145 mg/dL — ABNORMAL HIGH (ref 70–99)
Glucose-Capillary: 143 mg/dL — ABNORMAL HIGH (ref 70–99)
Glucose-Capillary: 189 mg/dL — ABNORMAL HIGH (ref 70–99)

## 2021-12-25 LAB — CBC
HCT: 39.2 % (ref 36.0–46.0)
Hemoglobin: 12.8 g/dL (ref 12.0–15.0)
MCH: 29.6 pg (ref 26.0–34.0)
MCHC: 32.7 g/dL (ref 30.0–36.0)
MCV: 90.5 fL (ref 80.0–100.0)
Platelets: 269 10*3/uL (ref 150–400)
RBC: 4.33 MIL/uL (ref 3.87–5.11)
RDW: 14.3 % (ref 11.5–15.5)
WBC: 6.6 10*3/uL (ref 4.0–10.5)
nRBC: 0 % (ref 0.0–0.2)

## 2021-12-25 LAB — BASIC METABOLIC PANEL
Anion gap: 9 (ref 5–15)
BUN: 41 mg/dL — ABNORMAL HIGH (ref 8–23)
CO2: 32 mmol/L (ref 22–32)
Calcium: 9 mg/dL (ref 8.9–10.3)
Chloride: 99 mmol/L (ref 98–111)
Creatinine, Ser: 1.91 mg/dL — ABNORMAL HIGH (ref 0.44–1.00)
GFR, Estimated: 27 mL/min — ABNORMAL LOW (ref >60)
Glucose, Bld: 119 mg/dL — ABNORMAL HIGH (ref 70–99)
Potassium: 3.1 mmol/L — ABNORMAL LOW (ref 3.5–5.1)
Sodium: 140 mmol/L (ref 135–145)

## 2021-12-25 LAB — PROTIME-INR
INR: 2.7 — ABNORMAL HIGH (ref 0.8–1.2)
Prothrombin Time: 28.9 seconds — ABNORMAL HIGH (ref 11.4–15.2)

## 2021-12-25 MED ORDER — POTASSIUM CHLORIDE 20 MEQ PO PACK
40.0000 meq | PACK | Freq: Once | ORAL | Status: AC
  Administered 2021-12-25: 40 meq via ORAL
  Filled 2021-12-25: qty 2

## 2021-12-25 MED ORDER — DAPAGLIFLOZIN PROPANEDIOL 10 MG PO TABS
10.0000 mg | ORAL_TABLET | Freq: Every day | ORAL | Status: DC
  Administered 2021-12-25 – 2021-12-28 (×4): 10 mg via ORAL
  Filled 2021-12-25 (×4): qty 1

## 2021-12-25 MED ORDER — POTASSIUM CHLORIDE 20 MEQ PO PACK
40.0000 meq | PACK | Freq: Two times a day (BID) | ORAL | Status: AC
  Administered 2021-12-25 – 2021-12-26 (×3): 40 meq via ORAL
  Filled 2021-12-25 (×3): qty 2

## 2021-12-25 MED ORDER — WARFARIN SODIUM 2.5 MG PO TABS
2.5000 mg | ORAL_TABLET | ORAL | Status: DC
  Administered 2021-12-25: 2.5 mg via ORAL
  Filled 2021-12-25: qty 1

## 2021-12-25 MED ORDER — WARFARIN SODIUM 5 MG PO TABS: 5.0000 mg | ORAL_TABLET | ORAL | Status: DC

## 2021-12-25 NOTE — Progress Notes (Signed)
Progress Note  Patient Name: Regina [name] Date of Encounter: 12/25/2021  [facility] Cardiologist: Evalina Field, MD   Subjective   Patient says her abdomen still feels full this is her sensation.  She notes some chest tightness.  Chronic.  Breathing is okay at rest.  Patient just had an episode of feeling "weird" little hazy, woozy.  She has never had before.  Not sure what it was.  Went to the bathroom.  Just getting over it  Inpatient Medications    Scheduled Meds:  allopurinol  100 mg Oral q morning   aspirin EC  81 mg Oral Daily   atorvastatin  20 mg Oral QHS   docusate sodium  100 mg Oral BID   famotidine  20 mg Oral QHS   furosemide  80 mg Intravenous BID   gabapentin  600 mg Oral BID   insulin aspart  0-15 Units Subcutaneous TID WC   insulin aspart  0-5 Units Subcutaneous QHS   insulin glargine-yfgn  26 Units Subcutaneous QHS   levothyroxine  150 mcg Oral Q0600   magnesium oxide  400 mg Oral q morning   metoprolol succinate  25 mg Oral Daily   sodium chloride flush  3 mL Intravenous Q12H   Tafamidis  61 mg Oral Daily   Warfarin - Pharmacist Dosing Inpatient   Does not apply q1600   Continuous Infusions:  PRN Meds: acetaminophen **OR** acetaminophen, bisacodyl, ondansetron **OR** ondansetron (ZOFRAN) IV, polyethylene glycol, polyvinyl alcohol, traZODone   Vital Signs    Vitals:   12/24/21 2047 12/25/21 0030 12/25/21 0430 12/25/21 0500  BP: 136/67 (!) 122/58 130/67   Pulse: 66 75 79   Resp: (!) '23 20 19   '$ Temp: 97.8 F (36.6 C) 97.6 F (36.4 C) 97.7 F (36.5 C)   TempSrc: Oral Oral Oral   SpO2: 93% 97% 99%   Weight:    100.4 kg  Height:        Intake/Output Summary (Last 24 hours) at 12/25/2021 0755 Last data filed at 12/24/2021 2100 Gross per 24 hour  Intake 360 ml  Output --  Net 360 ml   Last 3 Weights 12/25/2021 12/24/2021 12/13/2021  Weight (lbs) 221 lb 4.8 oz 227 lb 4.7 oz 227 lb 6.4 oz  Weight (kg) 100.381 kg 103.1 kg 103.148 kg       Telemetry    Afib   80s   - Personally Reviewed   Physical Exam   GEN: No acute distress.   Neck: Neck is full.  No definite JVD Cardiac: RRR, no murmurs, Respiratory: Clear to auscultation bilaterally. GI: Soft, nontender.  Slightly distended. MS: No edema; No deformity. Neuro:  Nonfocal  Psych: Normal affect   Labs    High Sensitivity Troponin:   Recent Labs  Lab 12/24/21 1022 12/24/21 1315  TROPONINIHS 85* 79*     Chemistry Recent Labs  Lab 12/24/21 1022 12/25/21 0151  NA 139 140  K 3.4* 3.1*  CL 99 99  CO2 31 32  GLUCOSE 156* 119*  BUN 43* 41*  CREATININE 2.12* 1.91*  CALCIUM 9.1 9.0  GFRNONAA 23* 27*  ANIONGAP 9 9    Lipids No results for input(s): CHOL, TRIG, HDL, LABVLDL, LDLCALC, CHOLHDL in the last 168 hours.  Hematology Recent Labs  Lab 12/24/21 1022 12/25/21 0151  WBC 6.8 6.6  RBC 4.32 4.33  HGB 12.3 12.8  HCT 39.5 39.2  MCV 91.4 90.5  MCH 28.5 29.6  MCHC 31.1 32.7  RDW 14.2 14.3  PLT 275 269   Thyroid No results for input(s): TSH, FREET4 in the last 168 hours.  BNP Recent Labs  Lab 12/24/21 1123  BNP 548.1*    DDimer  Recent Labs  Lab 12/24/21 1036  DDIMER 0.43     Radiology    DG Chest 2 View  Result Date: 12/24/2021 CLINICAL DATA:  Chest pain EXAM: CHEST - 2 VIEW COMPARISON:  11/08/2021 FINDINGS: Bilateral mild interstitial thickening. No pleural effusion or pneumothorax. Stable cardiomegaly. No acute osseous abnormality. Right shoulder arthroplasty. IMPRESSION: 1. Cardiomegaly with mild pulmonary vascular congestion. Electronically Signed   By: Kathreen Devoid M.D.   On: 12/24/2021 10:41   ECHOCARDIOGRAM COMPLETE  Result Date: 12/24/2021    ECHOCARDIOGRAM REPORT   Patient Name:   Regina [name] Date of Exam: 12/24/2021 Medical Rec #:  [id number]         Height:       62.0 in Accession #:    [id number]        Weight:       227.4 lb Date of Birth:  Oct 05, 1941        BSA:          2.019 m Patient Age:    81 years           BP:           148/82 mmHg Patient Gender: F                 HR:           85 bpm. Exam Location:  Inpatient Procedure: 2D Echo Indications:    chest pain  History:        Patient has prior history of Echocardiogram examinations, most                 recent 04/21/2021. CAD, chronic kidney disease, Arrythmias:Atrial                 Fibrillation; Risk Factors:Hypertension, Dyslipidemia, Sleep                 Apnea and Diabetes.  Sonographer:    Johny Chess RDCS Referring Phys: [id number] ADAM CURATOLO IMPRESSIONS  1. Left ventricular ejection fraction, by estimation, is 40 to 45%. The left ventricle has mildly decreased function. The left ventricle demonstrates global hypokinesis. There is mild concentric left ventricular hypertrophy. Left ventricular diastolic parameters are indeterminate.  2. Right ventricular systolic function is low normal. The right ventricular size is normal. Mildly increased right ventricular wall thickness. There is normal pulmonary artery systolic pressure.  3. Left atrial size was mildly dilated.  4. The mitral valve is normal in structure. Mild mitral valve regurgitation. No evidence of mitral stenosis.  5. The aortic valve was not well visualized. Aortic valve regurgitation is not visualized. No aortic stenosis is present. FINDINGS  Left Ventricle: Left ventricular ejection fraction, by estimation, is 40 to 45%. The left ventricle has mildly decreased function. The left ventricle demonstrates global hypokinesis. The left ventricular internal cavity size was normal in size. There is  mild concentric left ventricular hypertrophy. Left ventricular diastolic parameters are indeterminate. Right Ventricle: The right ventricular size is normal. Mildly increased right ventricular wall thickness. Right ventricular systolic function is low normal. There is normal pulmonary artery systolic pressure. The tricuspid regurgitant velocity is 2.59 m/s, and with an assumed right atrial pressure of 3 mmHg,  the estimated right ventricular systolic pressure is 59.9 mmHg. Left Atrium: Left  atrial size was mildly dilated. Right Atrium: Right atrial size was normal in size. Pericardium: Trivial pericardial effusion is present. Mitral Valve: The mitral valve is normal in structure. Mild mitral valve regurgitation. No evidence of mitral valve stenosis. Tricuspid Valve: The tricuspid valve is normal in structure. Tricuspid valve regurgitation is mild . No evidence of tricuspid stenosis. Aortic Valve: The aortic valve was not well visualized. Aortic valve regurgitation is not visualized. No aortic stenosis is present. Pulmonic Valve: The pulmonic valve was not well visualized. Pulmonic valve regurgitation is not visualized. Aorta: The aortic root is normal in size and structure. IAS/Shunts: No atrial level shunt detected by color flow Doppler.  LEFT VENTRICLE PLAX 2D LVIDd:         4.80 cm LVIDs:         3.40 cm LV PW:         1.10 cm LV IVS:        1.00 cm LVOT diam:     1.90 cm LV SV:         34 LV SV Index:   17 LVOT Area:     2.84 cm  RIGHT VENTRICLE            IVC RV S prime:     9.83 cm/s  IVC diam: 1.40 cm TAPSE (M-mode): 1.1 cm LEFT ATRIUM             Index        RIGHT ATRIUM           Index LA diam:        3.80 cm 1.88 cm/m   RA Area:     18.10 cm LA Vol (A2C):   60.7 ml 30.07 ml/m  RA Volume:   52.00 ml  25.76 ml/m LA Vol (A4C):   72.5 ml 35.91 ml/m LA Biplane Vol: 69.8 ml 34.57 ml/m  AORTIC VALVE LVOT Vmax:   61.80 cm/s LVOT Vmean:  41.400 cm/s LVOT VTI:    0.121 m  AORTA Ao Asc diam: 3.30 cm TRICUSPID VALVE TR Peak grad:   26.8 mmHg TR Vmax:        259.00 cm/s  SHUNTS Systemic VTI:  0.12 m Systemic Diam: 1.90 cm Rudean Haskell MD Electronically signed by Rudean Haskell MD Signature Date/Time: 12/24/2021/5:52:32 PM    Final     Cardiac Studies     Patient Profile  Regina [name] is a 81 y.o. female with a hx of persistent afib, LAA thrombus, cardiac amyloidosis/HFpEF, DM, CKD stage IV,  hypothyroidism who is being seen today for the evaluation of chest pain at the request of Dr. Lennice Sites, DO.    Assessment & Plan    1.  Chest tightness, abdominal fullness.  Patient has history of mild LV dysfunction.  Chest x-ray on admission and BNP goal along with volume overload.  Note trivial, flat elevation of troponins.  May be demand ischemia in setting of volume overload.  Exam is difficult but she probably still is volume overloaded.  Will increase dose of Lasix today.  Follow output.  If she remains symptomatic with tightness may consider right and left heart catheterization on Monday.  2.  CAD.  Last cardiac catheterization 2016 showed moderate RCA lesion and LAD lesion.  Follow symptoms with diuresis.  Consider cath.  3.  "Spell".  Patient just had a woozy spell.  Not sure if her blood pressure was low at the time.  Would continue to follow.  Keep on telemetry  3.  Persistent atrial fibrillation.  Rates controlled.  4.  Left atrial appendage thrombus.  Patient has been maintained on Coumadin.  Had on Eliquis.  Keep on heparin for now.  May need to hold transiently for cath.  Resume Coumadin before discharge\\  5.  Chronic kidney disease.  Followed by Dr. Osborne Casco as an outpatient. Creatinine 1.9 today. For questions or updates, please contact [place] Please consult [email or web address] for contact info under        Signed, Dorris Carnes, MD  12/25/2021, 7:55 AM

## 2021-12-25 NOTE — Progress Notes (Addendum)
PROGRESS NOTE    Regina [name]  JJH:[id number] DOB: 1941-03-21 DOA: 12/24/2021 PCP: [name], [name], MD    Brief Narrative:  Regina [name] is a 81 y.o. female with medical history significant of cardiac amyloidosis with chronic combined CHF; DM; HTN; hypothyroidism; CAD; atrial thrombus on Coumadin; and OSA presenting with chest pain presented to hospital with chest discomfort pressure for 1 week.  Patient had taken some nitroglycerin without much improvement.  In the ED patient had mild elevated troponin.  Cardiology was consulted and patient was admitted hospital for further evaluation and treatment.      Assessment and Plan: * Chest pain with high risk for cardiac etiology History of CAD.  Last catheterization on 2016 showed moderate RCA lesion and LAD lesion.  Intermittent chest pressure for 1 week with some pleuritic component.  Chest x-ray with vascular congestion.  Mild elevated troponins with renal dysfunction.EKG not indicative of acute ischemia.    Continue aspirin.  ESR pending.  2D echocardiogram with LV ejection fraction of 40 to 45% with global hypokinesis.  Seen by cardiology.  Patient might need cardiac catheterization if remains symptomatic Essential HTN -Continue Toprol  Hyperlipidemia -Continue Lipitor  DM type II. -Recent hemoglobin A1c was 7.6,, continue sliding scale insulin, long-acting insulin.  Closely monitor.   Acute on chronic diastolic CHF (congestive heart failure), NYHA class 3 (HCC) Patient does have history of chronic combined CHF and last echocardiogram done on 04/2021 showed LV ejection fraction of 45 to 50%.-Repeat 2D echo from 12/24/2021 showed LV ejection fraction of 40 to 45%.  Concentric LVH noted, currently on IV Lasix 80 twice daily.  Continue Lipitor, aspirin, metoprolol.  Continue CHF protocol.  Hypokalemia Potassium was 3.1 today.  Received oral potassium 40 mEq.  We will continue daily.  Check BMP in AM.  Left atrial thrombus Left  atrial appendage thrombus.  Cardiology on board.  On Coumadin.  We will continue.  INR is therapeutic at 2.7.  Wild-type transthyretin-related (ATTR) amyloidosis (HCC) -Continue Tafamidis  Persistent atrial fibrillation (HCC) Continue beta-blocker and Coumadin.  Continue to monitor INR.  Rate controlled at this time.  CKD (chronic kidney disease) stage 4, GFR 15-29 ml/min (HCC) Latest creatinine of 1.9.  We will continue to monitor renal function.  On Farxiga   OSA on CPAP -Continue CPAP  Hypothyroidism -Continue Synthroid    DVT prophylaxis:  warfarin (COUMADIN) tablet 2.5 mg  warfarin (COUMADIN) tablet 5 mg   Code Status:     Code Status: Full Code  Disposition: Home  Status is: Observation  The patient will require care spanning > 2 midnights and should be moved to inpatient because: IV diuretics, further monitoring, hypokalemia,   Family Communication: Spoke with the patient's granddaughter at bedside  Consultants:  Cardiology  Procedures:  None  Antimicrobials:  None  Anti-infectives (From admission, onward)    None      Subjective: Today, patient was seen and examined at bedside.  Patient denies any chest pain, shortness of breath but has mild cough.  Complains of abdominal swelling.  Was noted to have low potassium.  Objective: Vitals:   12/25/21 0430 12/25/21 0500 12/25/21 0856 12/25/21 0900  BP: 130/67  137/75 137/75  Pulse: 79  73   Resp: 19     Temp: 97.7 F (36.5 C)     TempSrc: Oral     SpO2: 99%     Weight:  100.4 kg    Height:  Intake/Output Summary (Last 24 hours) at 12/25/2021 1511 Last data filed at 12/25/2021 1100 Gross per 24 hour  Intake 820 ml  Output 501 ml  Net 319 ml   Filed Weights   12/24/21 1700 12/25/21 0500  Weight: 103.1 kg 100.4 kg   Body mass index is 40.48 kg/m.   Physical Examination:  General: Obese built, not in obvious distress HENT:   No scleral pallor or icterus noted. Oral mucosa is moist.   Chest:  Clear breath sounds.  Diminished breath sounds bilaterally. No crackles or wheezes.  CVS: S1 &S2 heard. No murmur.  Regular rate and rhythm. Abdomen: Soft, nontender, distended abdomen.  Bowel sounds are heard.   Extremities: No cyanosis, clubbing with trace leg edema.  Peripheral pulses are palpable. Psych: Alert, awake and oriented, normal mood CNS:  No cranial nerve deficits.  Power equal in all extremities.   Skin: Warm and dry.  No rashes noted.  Data Reviewed:   CBC: Recent Labs  Lab 12/24/21 1022 12/25/21 0151  WBC 6.8 6.6  HGB 12.3 12.8  HCT 39.5 39.2  MCV 91.4 90.5  PLT 275 749    Basic Metabolic Panel: Recent Labs  Lab 12/24/21 1022 12/25/21 0151  NA 139 140  K 3.4* 3.1*  CL 99 99  CO2 31 32  GLUCOSE 156* 119*  BUN 43* 41*  CREATININE 2.12* 1.91*  CALCIUM 9.1 9.0    Liver Function Tests: No results for input(s): AST, ALT, ALKPHOS, BILITOT, PROT, ALBUMIN in the last 168 hours.   Radiology Studies: DG Chest 2 View  Result Date: 12/24/2021 CLINICAL DATA:  Chest pain EXAM: CHEST - 2 VIEW COMPARISON:  11/08/2021 FINDINGS: Bilateral mild interstitial thickening. No pleural effusion or pneumothorax. Stable cardiomegaly. No acute osseous abnormality. Right shoulder arthroplasty. IMPRESSION: 1. Cardiomegaly with mild pulmonary vascular congestion. Electronically Signed   By: Kathreen Devoid M.D.   On: 12/24/2021 10:41   ECHOCARDIOGRAM COMPLETE  Result Date: 12/24/2021    ECHOCARDIOGRAM REPORT   Patient Name:   Regina [name] Date of Exam: 12/24/2021 Medical Rec #:  [id number]         Height:       62.0 in Accession #:    [id number]        Weight:       227.4 lb Date of Birth:  September 28, 1941        BSA:          2.019 m Patient Age:    9 years          BP:           148/82 mmHg Patient Gender: F                 HR:           85 bpm. Exam Location:  Inpatient Procedure: 2D Echo Indications:    chest pain  History:        Patient has prior history of  Echocardiogram examinations, most                 recent 04/21/2021. CAD, chronic kidney disease, Arrythmias:Atrial                 Fibrillation; Risk Factors:Hypertension, Dyslipidemia, Sleep                 Apnea and Diabetes.  Sonographer:    Johny Chess RDCS Referring Phys: [id number] ADAM CURATOLO IMPRESSIONS  1. Left ventricular ejection fraction, by estimation, is  40 to 45%. The left ventricle has mildly decreased function. The left ventricle demonstrates global hypokinesis. There is mild concentric left ventricular hypertrophy. Left ventricular diastolic parameters are indeterminate.  2. Right ventricular systolic function is low normal. The right ventricular size is normal. Mildly increased right ventricular wall thickness. There is normal pulmonary artery systolic pressure.  3. Left atrial size was mildly dilated.  4. The mitral valve is normal in structure. Mild mitral valve regurgitation. No evidence of mitral stenosis.  5. The aortic valve was not well visualized. Aortic valve regurgitation is not visualized. No aortic stenosis is present. FINDINGS  Left Ventricle: Left ventricular ejection fraction, by estimation, is 40 to 45%. The left ventricle has mildly decreased function. The left ventricle demonstrates global hypokinesis. The left ventricular internal cavity size was normal in size. There is  mild concentric left ventricular hypertrophy. Left ventricular diastolic parameters are indeterminate. Right Ventricle: The right ventricular size is normal. Mildly increased right ventricular wall thickness. Right ventricular systolic function is low normal. There is normal pulmonary artery systolic pressure. The tricuspid regurgitant velocity is 2.59 m/s, and with an assumed right atrial pressure of 3 mmHg, the estimated right ventricular systolic pressure is 09.3 mmHg. Left Atrium: Left atrial size was mildly dilated. Right Atrium: Right atrial size was normal in size. Pericardium: Trivial pericardial  effusion is present. Mitral Valve: The mitral valve is normal in structure. Mild mitral valve regurgitation. No evidence of mitral valve stenosis. Tricuspid Valve: The tricuspid valve is normal in structure. Tricuspid valve regurgitation is mild . No evidence of tricuspid stenosis. Aortic Valve: The aortic valve was not well visualized. Aortic valve regurgitation is not visualized. No aortic stenosis is present. Pulmonic Valve: The pulmonic valve was not well visualized. Pulmonic valve regurgitation is not visualized. Aorta: The aortic root is normal in size and structure. IAS/Shunts: No atrial level shunt detected by color flow Doppler.  LEFT VENTRICLE PLAX 2D LVIDd:         4.80 cm LVIDs:         3.40 cm LV PW:         1.10 cm LV IVS:        1.00 cm LVOT diam:     1.90 cm LV SV:         34 LV SV Index:   17 LVOT Area:     2.84 cm  RIGHT VENTRICLE            IVC RV S prime:     9.83 cm/s  IVC diam: 1.40 cm TAPSE (M-mode): 1.1 cm LEFT ATRIUM             Index        RIGHT ATRIUM           Index LA diam:        3.80 cm 1.88 cm/m   RA Area:     18.10 cm LA Vol (A2C):   60.7 ml 30.07 ml/m  RA Volume:   52.00 ml  25.76 ml/m LA Vol (A4C):   72.5 ml 35.91 ml/m LA Biplane Vol: 69.8 ml 34.57 ml/m  AORTIC VALVE LVOT Vmax:   61.80 cm/s LVOT Vmean:  41.400 cm/s LVOT VTI:    0.121 m  AORTA Ao Asc diam: 3.30 cm TRICUSPID VALVE TR Peak grad:   26.8 mmHg TR Vmax:        259.00 cm/s  SHUNTS Systemic VTI:  0.12 m Systemic Diam: 1.90 cm Rudean Haskell MD Electronically signed by  Rudean Haskell MD Signature Date/Time: 12/24/2021/5:52:32 PM    Final       LOS: 0 days    Flora Lipps, MD [facility] 12/25/2021, 3:11 PM

## 2021-12-25 NOTE — Care Management Obs Status (Signed)
MEDICARE OBSERVATION STATUS NOTIFICATION ? ? ?Patient Details  ?Name: Regina [name] ?MRN: [id number] ?Date of Birth: Feb 23, 1941 ? ? ?Medicare Observation Status Notification Given:  Yes ? ? ? ?Marigold Mom G., RN ?12/25/2021, 11:15 AM ?

## 2021-12-25 NOTE — Progress Notes (Signed)
ANTICOAGULATION CONSULT NOTE  ?Pharmacy Consult for warfarin (taking PTA) ?Indication:  VTE treatment ? ?Allergies  ?Allergen Reactions  ? Ace Inhibitors Other (See Comments)  ?  unknown  ? Amlodipine Other (See Comments)  ?  unknown  ? Atenolol Other (See Comments)  ?  bradycardia  ? Avandia [Rosiglitazone] Other (See Comments)  ?  unknown  ? Darvon [Propoxyphene] Other (See Comments)  ?  unknown  ? Erythromycin Itching  ? Hydralazine Other (See Comments)  ?  Burning in throat and chest  ? Hydrocodone Other (See Comments)  ?  Hallucinations.  ? Levofloxacin Itching  ? Morphine And Related Other (See Comments)  ?  Dizzy and hallucianation, vomiting; ?Willing to try low dose  ? Percocet [Oxycodone-Acetaminophen] Other (See Comments)  ?  hallucination  ? Spironolactone Other (See Comments)  ?  unknown  ? Tramadol Other (See Comments)  ?  Unknown/does not recall reaction but does not want to take again  ? ? ?Patient Measurements: ?Height: _0  (157.5 cm) ?Weight: 100.4 kg (221 lb 4.8 oz) ?IBW/kg (Calculated) : 50.1 ?Heparin Dosing Weight: 74.8 kg ? ?Vital Signs: ?Temp: 97.7 ?F (36.5 ?C) (03/11 0430) ?Temp Source: Oral (03/11 0430) ?BP: 137/75 (03/11 0900) ?Pulse Rate: 73 (03/11 0856) ? ?Labs: ?Recent Labs  ?  12/24/21 ?1022 12/24/21 ?1315 12/25/21 ?0151  ?HGB 12.3  --  12.8  ?HCT 39.5  --  39.2  ?PLT 275  --  269  ?LABPROT 29.6*  --  28.9*  ?INR 2.8*  --  2.7*  ?CREATININE 2.12*  --  1.91*  ?TROPONINIHS 85* 79*  --   ? ? ? ?Estimated Creatinine Clearance: 26.9 mL/min (A) (by C-G formula based on SCr of 1.91 mg/dL (H)). ? ? ?Medical History: ?Past Medical History:  ?Diagnosis Date  ? Back pain   ? CHF (congestive heart failure) ([place])   ? 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 Chronic combined systolic and diastolic CHF (congestive heart failure) ([place])   ? Diabetes mellitus without complication ([place])   ? GERD (gastroesophageal reflux disease)   ? Heart disease   ? Hypertension   ? Hypothyroidism   ? Joint  pain   ? Mini stroke   ? Non-obstructive CAD   ? a. 01/2015 Cardiolite: + inf wall ischemia, EF 56%;  b. 01/2015 Cath: LM nl, LAD 66m LCX min irregs, RCA dominant, 50-646m ? Sleep apnea   ? Swallowing difficulty   ? ? ?Medications:  ?Medications Prior to Admission  ?Medication Sig Dispense Refill Last Dose  ? allopurinol (ZYLOPRIM) 100 MG tablet TAKE ONE TABLET BY MOUTH EVERY MORNING 90 tablet 1 12/23/2021  ? atorvastatin (LIPITOR) 20 MG tablet TAKE ONE TABLET BY MOUTH EVERYDAY AT BEDTIME 90 tablet 2 12/23/2021  ? Cholecalciferol (VITAMIN D3) 50 MCG (2000 UT) capsule Take 2,000 Units by mouth daily.   12/23/2021  ? famotidine (PEPCID) 20 MG tablet Take 1 tablet (20 mg total) by mouth at bedtime. 90 tablet 1 12/23/2021  ? FARXIGA 10 MG TABS tablet Take 1 tablet (10 mg total) by mouth daily. 90 tablet 3 12/23/2021  ? gabapentin (NEURONTIN) 600 MG tablet TAKE ONE TABLET BY MOUTH EVERY MORNING and TAKE ONE TABLET BY MOUTH EVERYDAY AT BEDTIME 60 tablet 3 12/23/2021  ? insulin glargine, 1 Unit Dial, (TOUJEO SOLOSTAR) 300 UNIT/ML Solostar Pen Inject 26 Units into the skin daily. Eat a snack with protein nightly before bedtime. 15 mL 6 12/23/2021  ? insulin lispro (HUMALOG KWIKPEN) 100  UNIT/ML KwikPen Max daily 15 units (Patient taking differently: 0-8 Units 3 (three) times daily. Max daily 15 units. Sliding Scale: If BS>160=0 units, 161-190=1 unit, 191-220=2 units, 221-250=3 units, 251-280=4 units, 281-310=5 units, 311-340=6 units, 341-370=7 units, 371-400=8 units) 15 mL 11 12/23/2021  ? levothyroxine (SYNTHROID) 150 MCG tablet TAKE ONE TABLET BY MOUTH BEFORE BREAKFAST 90 tablet 1 12/24/2021  ? magnesium oxide (MAG-OX) 400 MG tablet TAKE ONE TABLET BY MOUTH EVERY MORNING 90 tablet 1 12/23/2021  ? meclizine (ANTIVERT) 25 MG tablet Take 25 mg by mouth daily as needed for dizziness.   unknown  ? metoprolol succinate (TOPROL-XL) 25 MG 24 hr tablet Take 1 tablet (25 mg total) by mouth daily. Take with or immediately following a meal. 90 tablet 3  12/23/2021  ? nitroGLYCERIN (NITROSTAT) 0.4 MG SL tablet Place 1 tablet under tongue for chest pain. Repeat every five minutes 2 more times if needed for a total of 3 tablets within 15 minutes. 25 tablet 2 12/24/2021  ? Polyethylene Glycol 3350 (MIRALAX PO) Take 1 Package by mouth daily as needed (constipation).   unknown  ? Propylene Glycol (SYSTANE BALANCE OP) Place 1 drop into both eyes daily as needed (for dry eyes).   unknown  ? SALINE MIST SPRAY NA Place 2 sprays into the nose at bedtime.   12/23/2021  ? Tafamidis 61 MG CAPS Take 61 mg by mouth daily. 30 capsule 12 12/23/2021  ? torsemide (DEMADEX) 20 MG tablet TAKE TWO TABLETS BY MOUTH EVERY MORNING Ok to take extra dose if weight gain of 1lb in 1 day or 5 lbs in 7 days 180 tablet 3 12/23/2021  ? warfarin (COUMADIN) 5 MG tablet TAKE 1/2 TO 1 TABLET BY MOUTH daily OR AS DIRECTED by THE coumadin clinic (Patient taking differently: Take 2.5-5 mg by mouth as directed. Take 1/2 tablet (2.5 mg) Daily Except On Sundays Take 1 tablet (5 mg) or as directed by the Coumadin clinic) 30 tablet 0 12/23/2021 at 2200  ? Alcohol Swabs (B-D SINGLE USE SWABS REGULAR) PADS Use to test blood sugar up to 3 times daily 100 each 3   ? Blood Glucose Monitoring Suppl (ACCU-CHEK GUIDE) w/Device KIT 1 Device by Does not apply route 3 (three) times daily. 1 kit 0   ? Capsaicin 0.033 % CREA Apply 1 application topically 3 (three) times daily. (Patient not taking: Reported on 12/24/2021) 56.6 g 11 Completed Course  ? COMFORT EZ PEN NEEDLES 31G X 8 MM MISC USE 3 TO 4 times daily as directed 100 each 2   ? Continuous Blood Gluc Sensor (DEXCOM G6 SENSOR) MISC 1 Device by Does not apply route as directed. 9 each 3   ? Continuous Blood Gluc Transmit (DEXCOM G6 TRANSMITTER) MISC 1 Device by Does not apply route as directed. 1 each 3   ? glucose blood (ACCU-CHEK GUIDE) test strip Use to test blood sugar up to 3 times daily 100 each 3   ? Insulin Pen Needle 32G X 4 MM MISC 1 Device by Does not apply route in  the morning, at noon, in the evening, and at bedtime. 150 each 6   ? ipratropium (ATROVENT) 0.06 % nasal spray Place 2 sprays into both nostrils 4 (four) times daily. (Patient not taking: Reported on 12/24/2021) 15 mL 3 Completed Course  ? Lancets Misc. (ACCU-CHEK FASTCLIX LANCET) KIT Use to test blood sugar up to 3 times daily 1 kit 1   ? tiZANidine (ZANAFLEX) 4 MG tablet Take 0.5-1 tablets (2-4  mg total) by mouth at bedtime as needed for muscle spasms. (Patient not taking: Reported on 12/24/2021) 30 tablet 0 Completed Course  ? ?Scheduled:  ? allopurinol  100 mg Oral q morning  ? aspirin EC  81 mg Oral Daily  ? atorvastatin  20 mg Oral QHS  ? dapagliflozin propanediol  10 mg Oral Daily  ? docusate sodium  100 mg Oral BID  ? famotidine  20 mg Oral QHS  ? furosemide  80 mg Intravenous BID  ? gabapentin  600 mg Oral BID  ? insulin aspart  0-15 Units Subcutaneous TID WC  ? insulin aspart  0-5 Units Subcutaneous QHS  ? insulin glargine-yfgn  26 Units Subcutaneous QHS  ? levothyroxine  150 mcg Oral Q0600  ? magnesium oxide  400 mg Oral q morning  ? metoprolol succinate  25 mg Oral Daily  ? sodium chloride flush  3 mL Intravenous Q12H  ? Tafamidis  61 mg Oral Daily  ? Warfarin - Pharmacist Dosing Inpatient   Does not apply [id number]  ? ? ?Assessment: ?PMH of cardiac amyloidosis with chronic combined CHF, DM, HTN, hypothyroidism, CAD, persistent Afib. Patient on warfarin for atrial thrombus. Patient presented with chest pain, heaviness, and pressure. Last outpatient coag visit was 11/23/21 with INR of 2.6. INR today is therapeutic at 2.8. CBC stable. Patient reported last dose 12/23/21.  ? ?PTA regimen (per 11/23/21 anticoag visit): 5 mg on Sun, 2.5 mg AOD ? ?INR therapeutic at 1.7 today.  ? ?Goal of Therapy:  ?INR 2-3 ?  ?Plan:  ?Restart home regimen - 5 mg qSun, 2 mg all other days ?Daily INR ?Weekly CBC ? ?Thank you for allowing pharmacy to participate in this patient's care. ? ?Laurey Arrow, PharmD ?PGY1 Pharmacy  Resident ?12/25/2021  2:01 PM ? ?Please check [email or web address] for unit-specific pharmacy phone numbers. ? ? ? ? ?

## 2021-12-25 NOTE — Hospital Course (Addendum)
Regina [name] is a 81 y.o. female with medical history significant of cardiac amyloidosis with chronic combined CHF; DM; HTN; hypothyroidism; CAD; atrial thrombus on Coumadin; and OSA presenting with chest pain presented to hospital with chest discomfort pressure for 1 week.  Patient had taken some nitroglycerin without much improvement.  In the ED, patient had mild elevated troponin.  Cardiology was consulted and patient was admitted hospital for further evaluation and treatment.  During hospitalization, patient was seen by cardiology and underwent IV diuresis.  Patient also underwent right-sided cardiac catheterization on 12/27/2021.

## 2021-12-25 NOTE — Assessment & Plan Note (Addendum)
Improved after replacement.  Potassium of 4.2 today.

## 2021-12-25 NOTE — Assessment & Plan Note (Addendum)
Left atrial appendage thrombus.  Cardiology on board.  On Coumadin.  INR is therapeutic at 2.5.

## 2021-12-26 DIAGNOSIS — E8582 Wild-type transthyretin-related (ATTR) amyloidosis: Secondary | ICD-10-CM | POA: Diagnosis present

## 2021-12-26 DIAGNOSIS — I4892 Unspecified atrial flutter: Secondary | ICD-10-CM | POA: Diagnosis present

## 2021-12-26 DIAGNOSIS — Z6841 Body Mass Index (BMI) 40.0 and over, adult: Secondary | ICD-10-CM | POA: Diagnosis not present

## 2021-12-26 DIAGNOSIS — I2510 Atherosclerotic heart disease of native coronary artery without angina pectoris: Secondary | ICD-10-CM | POA: Diagnosis present

## 2021-12-26 DIAGNOSIS — I43 Cardiomyopathy in diseases classified elsewhere: Secondary | ICD-10-CM | POA: Diagnosis present

## 2021-12-26 DIAGNOSIS — G4733 Obstructive sleep apnea (adult) (pediatric): Secondary | ICD-10-CM | POA: Diagnosis present

## 2021-12-26 DIAGNOSIS — I272 Pulmonary hypertension, unspecified: Secondary | ICD-10-CM | POA: Diagnosis not present

## 2021-12-26 DIAGNOSIS — E669 Obesity, unspecified: Secondary | ICD-10-CM | POA: Diagnosis present

## 2021-12-26 DIAGNOSIS — I513 Intracardiac thrombosis, not elsewhere classified: Secondary | ICD-10-CM | POA: Diagnosis not present

## 2021-12-26 DIAGNOSIS — I248 Other forms of acute ischemic heart disease: Secondary | ICD-10-CM | POA: Diagnosis present

## 2021-12-26 DIAGNOSIS — R079 Chest pain, unspecified: Secondary | ICD-10-CM | POA: Diagnosis present

## 2021-12-26 DIAGNOSIS — I5033 Acute on chronic diastolic (congestive) heart failure: Secondary | ICD-10-CM | POA: Diagnosis not present

## 2021-12-26 DIAGNOSIS — Z8616 Personal history of COVID-19: Secondary | ICD-10-CM | POA: Diagnosis not present

## 2021-12-26 DIAGNOSIS — I2721 Secondary pulmonary arterial hypertension: Secondary | ICD-10-CM | POA: Diagnosis present

## 2021-12-26 DIAGNOSIS — I4819 Other persistent atrial fibrillation: Secondary | ICD-10-CM | POA: Diagnosis not present

## 2021-12-26 DIAGNOSIS — I130 Hypertensive heart and chronic kidney disease with heart failure and stage 1 through stage 4 chronic kidney disease, or unspecified chronic kidney disease: Secondary | ICD-10-CM | POA: Diagnosis present

## 2021-12-26 DIAGNOSIS — Z23 Encounter for immunization: Secondary | ICD-10-CM | POA: Diagnosis present

## 2021-12-26 DIAGNOSIS — E854 Organ-limited amyloidosis: Secondary | ICD-10-CM | POA: Diagnosis present

## 2021-12-26 DIAGNOSIS — I5043 Acute on chronic combined systolic (congestive) and diastolic (congestive) heart failure: Secondary | ICD-10-CM | POA: Diagnosis present

## 2021-12-26 DIAGNOSIS — Z20822 Contact with and (suspected) exposure to covid-19: Secondary | ICD-10-CM | POA: Diagnosis present

## 2021-12-26 DIAGNOSIS — K219 Gastro-esophageal reflux disease without esophagitis: Secondary | ICD-10-CM | POA: Diagnosis present

## 2021-12-26 DIAGNOSIS — E876 Hypokalemia: Secondary | ICD-10-CM | POA: Diagnosis present

## 2021-12-26 DIAGNOSIS — N184 Chronic kidney disease, stage 4 (severe): Secondary | ICD-10-CM | POA: Diagnosis not present

## 2021-12-26 DIAGNOSIS — E890 Postprocedural hypothyroidism: Secondary | ICD-10-CM | POA: Diagnosis present

## 2021-12-26 DIAGNOSIS — E1122 Type 2 diabetes mellitus with diabetic chronic kidney disease: Secondary | ICD-10-CM | POA: Diagnosis present

## 2021-12-26 DIAGNOSIS — E782 Mixed hyperlipidemia: Secondary | ICD-10-CM | POA: Diagnosis present

## 2021-12-26 DIAGNOSIS — I10 Essential (primary) hypertension: Secondary | ICD-10-CM | POA: Diagnosis not present

## 2021-12-26 DIAGNOSIS — E1142 Type 2 diabetes mellitus with diabetic polyneuropathy: Secondary | ICD-10-CM | POA: Diagnosis present

## 2021-12-26 LAB — BASIC METABOLIC PANEL
Anion gap: 8 (ref 5–15)
BUN: 48 mg/dL — ABNORMAL HIGH (ref 8–23)
CO2: 29 mmol/L (ref 22–32)
Calcium: 8.9 mg/dL (ref 8.9–10.3)
Chloride: 101 mmol/L (ref 98–111)
Creatinine, Ser: 2.12 mg/dL — ABNORMAL HIGH (ref 0.44–1.00)
GFR, Estimated: 23 mL/min — ABNORMAL LOW (ref >60)
Glucose, Bld: 103 mg/dL — ABNORMAL HIGH (ref 70–99)
Potassium: 4.1 mmol/L (ref 3.5–5.1)
Sodium: 138 mmol/L (ref 135–145)

## 2021-12-26 LAB — MAGNESIUM: Magnesium: 2.1 mg/dL (ref 1.7–2.4)

## 2021-12-26 LAB — GLUCOSE, CAPILLARY
Glucose-Capillary: 145 mg/dL — ABNORMAL HIGH (ref 70–99)
Glucose-Capillary: 108 mg/dL — ABNORMAL HIGH (ref 70–99)
Glucose-Capillary: 170 mg/dL — ABNORMAL HIGH (ref 70–99)
Glucose-Capillary: 197 mg/dL — ABNORMAL HIGH (ref 70–99)

## 2021-12-26 LAB — PROTIME-INR
INR: 2.8 — ABNORMAL HIGH (ref 0.8–1.2)
Prothrombin Time: 29.6 seconds — ABNORMAL HIGH (ref 11.4–15.2)

## 2021-12-26 MED ORDER — SODIUM CHLORIDE 0.9 % IV SOLN: 250.0000 mL | INTRAVENOUS | Status: DC | PRN

## 2021-12-26 MED ORDER — SODIUM CHLORIDE 0.9 % IV SOLN: INTRAVENOUS | Status: DC

## 2021-12-26 MED ORDER — ASPIRIN 81 MG PO CHEW
81.0000 mg | CHEWABLE_TABLET | ORAL | Status: AC
  Administered 2021-12-27: 81 mg via ORAL
  Filled 2021-12-26: qty 1

## 2021-12-26 MED ORDER — SODIUM CHLORIDE 0.9% FLUSH: 3.0000 mL | INTRAVENOUS | Status: DC | PRN

## 2021-12-26 NOTE — Progress Notes (Signed)
[place] for warfarin (taking PTA) Indication:  VTE treatment  Allergies  Allergen Reactions   Ace Inhibitors Other (See Comments)    unknown   Amlodipine Other (See Comments)    unknown   Atenolol Other (See Comments)    bradycardia   Avandia [Rosiglitazone] Other (See Comments)    unknown   Darvon [Propoxyphene] Other (See Comments)    unknown   Erythromycin Itching   Hydralazine Other (See Comments)    Burning in throat and chest   Hydrocodone Other (See Comments)    Hallucinations.   Levofloxacin Itching   Morphine And Related Other (See Comments)    Dizzy and hallucianation, vomiting; Willing to try low dose   Percocet [Oxycodone-Acetaminophen] Other (See Comments)    hallucination   Spironolactone Other (See Comments)    unknown   Tramadol Other (See Comments)    Unknown/does not recall reaction but does not want to take again    Patient Measurements: Height: _0  (157.5 cm) Weight: 100.2 kg (220 lb 14.4 oz) IBW/kg (Calculated) : 50.1 Heparin Dosing Weight: 74.8 kg  Vital Signs: Temp: 98.5 F (36.9 C) (03/12 1133) Temp Source: Oral (03/12 1133) BP: 111/69 (03/12 1133) Pulse Rate: 77 (03/12 1133)  Labs: Recent Labs    12/24/21 1022 12/24/21 1315 12/25/21 0151 12/26/21 0047  HGB 12.3  --  12.8  --   HCT 39.5  --  39.2  --   PLT 275  --  269  --   LABPROT 29.6*  --  28.9* 29.6*  INR 2.8*  --  2.7* 2.8*  CREATININE 2.12*  --  1.91* 2.12*  TROPONINIHS 85* 79*  --   --      Estimated Creatinine Clearance: 24.2 mL/min (A) (by C-G formula based on SCr of 2.12 mg/dL (H)).   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 01/2015 Cardiolite: + inf wall ischemia, EF 56%;  b. 01/2015 Cath: LM nl, LAD 19m LCX min irregs, RCA dominant, 50-666m  Sleep apnea    Swallowing difficulty     Medications:  Medications Prior to Admission  Medication Sig Dispense Refill Last Dose   allopurinol (ZYLOPRIM) 100 MG tablet TAKE ONE TABLET BY MOUTH EVERY MORNING 90 tablet 1 12/23/2021   atorvastatin (LIPITOR) 20 MG tablet TAKE ONE TABLET BY MOUTH EVERYDAY AT BEDTIME 90 tablet 2 12/23/2021   Cholecalciferol (VITAMIN D3) 50 MCG (2000 UT) capsule Take 2,000 Units by mouth daily.   12/23/2021   famotidine (PEPCID) 20 MG tablet Take 1 tablet (20 mg total) by mouth at bedtime. 90 tablet 1 12/23/2021   FARXIGA 10 MG TABS tablet Take 1 tablet (10 mg total) by mouth daily. 90 tablet 3 12/23/2021   gabapentin (NEURONTIN) 600 MG tablet TAKE ONE TABLET BY MOUTH EVERY MORNING and TAKE ONE TABLET BY MOUTH EVERYDAY AT BEDTIME 60 tablet 3 12/23/2021   insulin glargine, 1 Unit Dial, (TOUJEO SOLOSTAR) 300 UNIT/ML Solostar Pen Inject 26 Units into the skin daily. Eat a  snack with protein nightly before bedtime. 15 mL 6 12/23/2021   insulin lispro (HUMALOG KWIKPEN) 100 UNIT/ML KwikPen Max daily 15 units (Patient taking differently: 0-8 Units 3 (three) times daily. Max daily 15 units. Sliding Scale: If BS>160=0 units, 161-190=1 unit, 191-220=2 units, 221-250=3 units, 251-280=4 units, 281-310=5 units, 311-340=6 units, 341-370=7 units, 371-400=8 units) 15 mL 11 12/23/2021   levothyroxine (SYNTHROID) 150 MCG tablet TAKE ONE TABLET BY MOUTH BEFORE BREAKFAST 90 tablet 1 12/24/2021   magnesium oxide (MAG-OX) 400 MG tablet TAKE ONE TABLET BY MOUTH EVERY MORNING 90 tablet 1 12/23/2021   meclizine (ANTIVERT) 25 MG tablet Take 25 mg by mouth daily as needed for dizziness.   unknown   metoprolol succinate (TOPROL-XL) 25 MG 24 hr tablet Take 1 tablet (25 mg total) by mouth daily.  Take with or immediately following a meal. 90 tablet 3 12/23/2021   nitroGLYCERIN (NITROSTAT) 0.4 MG SL tablet Place 1 tablet under tongue for chest pain. Repeat every five minutes 2 more times if needed for a total of 3 tablets within 15 minutes. 25 tablet 2 12/24/2021   Polyethylene Glycol 3350 (MIRALAX PO) Take 1 Package by mouth daily as needed (constipation).   unknown   Propylene Glycol (SYSTANE BALANCE OP) Place 1 drop into both eyes daily as needed (for dry eyes).   unknown   SALINE MIST SPRAY NA Place 2 sprays into the nose at bedtime.   12/23/2021   Tafamidis 61 MG CAPS Take 61 mg by mouth daily. 30 capsule 12 12/23/2021   torsemide (DEMADEX) 20 MG tablet TAKE TWO TABLETS BY MOUTH EVERY MORNING Ok to take extra dose if weight gain of 1lb in 1 day or 5 lbs in 7 days 180 tablet 3 12/23/2021   warfarin (COUMADIN) 5 MG tablet TAKE 1/2 TO 1 TABLET BY MOUTH daily OR AS DIRECTED by THE coumadin clinic (Patient taking differently: Take 2.5-5 mg by mouth as directed. Take 1/2 tablet (2.5 mg) Daily Except On Sundays Take 1 tablet (5 mg) or as directed by the Coumadin clinic) 30 tablet 0 12/23/2021 at 2200   Alcohol Swabs (B-D SINGLE USE SWABS REGULAR) PADS Use to test blood sugar up to 3 times daily 100 each 3    Blood Glucose Monitoring Suppl (ACCU-CHEK GUIDE) w/Device KIT 1 Device by Does not apply route 3 (three) times daily. 1 kit 0    Capsaicin 0.033 % CREA Apply 1 application topically 3 (three) times daily. (Patient not taking: Reported on 12/24/2021) 56.6 g 11 Completed Course   COMFORT EZ PEN NEEDLES 31G X 8 MM MISC USE 3 TO 4 times daily as directed 100 each 2    Continuous Blood Gluc Sensor (DEXCOM G6 SENSOR) MISC 1 Device by Does not apply route as directed. 9 each 3    Continuous Blood Gluc Transmit (DEXCOM G6 TRANSMITTER) MISC 1 Device by Does not apply route as directed. 1 each 3    glucose blood (ACCU-CHEK GUIDE) test strip Use to test blood sugar up to 3 times daily 100 each 3    Insulin Pen  Needle 32G X 4 MM MISC 1 Device by Does not apply route in the morning, at noon, in the evening, and at bedtime. 150 each 6    ipratropium (ATROVENT) 0.06 % nasal spray Place 2 sprays into both nostrils 4 (four) times daily. (Patient not taking: Reported on 12/24/2021) 15 mL 3 Completed Course   Lancets Misc. (ACCU-CHEK FASTCLIX LANCET) KIT Use to test blood sugar up to 3  times daily 1 kit 1    tiZANidine (ZANAFLEX) 4 MG tablet Take 0.5-1 tablets (2-4 mg total) by mouth at bedtime as needed for muscle spasms. (Patient not taking: Reported on 12/24/2021) 30 tablet 0 Completed Course   Scheduled:   allopurinol  100 mg Oral q morning   aspirin EC  81 mg Oral Daily   atorvastatin  20 mg Oral QHS   dapagliflozin propanediol  10 mg Oral Daily   docusate sodium  100 mg Oral BID   famotidine  20 mg Oral QHS   furosemide  80 mg Intravenous BID   gabapentin  600 mg Oral BID   insulin aspart  0-15 Units Subcutaneous TID WC   insulin aspart  0-5 Units Subcutaneous QHS   insulin glargine-yfgn  26 Units Subcutaneous QHS   levothyroxine  150 mcg Oral Q0600   magnesium oxide  400 mg Oral q morning   metoprolol succinate  25 mg Oral Daily   potassium chloride  40 mEq Oral BID   sodium chloride flush  3 mL Intravenous Q12H   Tafamidis  61 mg Oral Daily   Warfarin - Pharmacist Dosing Inpatient   Does not apply q1600    Assessment: PMH of cardiac amyloidosis with chronic combined CHF, DM, HTN, hypothyroidism, CAD, persistent Afib. Patient on warfarin for atrial thrombus. Patient presented with chest pain, heaviness, and pressure. Last outpatient coag visit was 11/23/21 with INR of 2.6. INR today is therapeutic at 2.8. CBC stable. Patient reported last dose 12/23/21.   PTA regimen (per 11/23/21 anticoag visit): 5 mg on Sun, 2.5 mg AOD  INR jumped to 2.8 today after restarting PTA regimen on Saturday. CBC stable. Will hold dose today in anticipation of RHC tomorrow.   Goal of Therapy:  INR 2-3   Plan:  Hold  warfarin dose today F/U restart PTA regimen post cath Daily INR Weekly CBC  Thank you for allowing pharmacy to participate in this patient's care.  Laurey Arrow, PharmD PGY1 Pharmacy Resident 12/26/2021  1:39 PM  Please check [email or web address] for unit-specific pharmacy phone numbers.

## 2021-12-26 NOTE — Progress Notes (Signed)
PROGRESS NOTE    Regina [name]  VEL:[id number] DOB: [birth year] DOA: 12/24/2021 PCP: [name], [name], MD    Brief Narrative:  Regina [name] is a 81 y.o. female with medical history significant of cardiac amyloidosis with chronic combined CHF; DM; HTN; hypothyroidism; CAD; atrial thrombus on Coumadin; and OSA presenting with chest pain presented to hospital with chest discomfort pressure for 1 week.  Patient had taken some nitroglycerin without much improvement.  In the ED patient had mild elevated troponin.  Cardiology was consulted and patient was admitted hospital for further evaluation and treatment.      Assessment and Plan: * Chest pain with high risk for cardiac etiology History of CAD.  Last catheterization on 2016 showed moderate RCA lesion and LAD lesion.  Intermittent chest pressure for 1 week with some pleuritic component.  Chest x-ray with vascular congestion.  Mild elevated troponins with renal dysfunction .EKG not indicative of acute ischemia.    Continue aspirin.  ESR pending.  2D echocardiogram with LV ejection fraction of 40 to 45% with global hypokinesis.  Seen by cardiology and will follow recommendations.    Essential HTN -Continue Toprol blood pressure seems to be better.  Hyperlipidemia -Continue Lipitor  DM type II. -Recent hemoglobin A1c was 7.6,, continue sliding scale insulin, long-acting insulin.  Closely monitor.  Latest POC glucose of 145   Acute on chronic diastolic CHF (congestive heart failure), NYHA class 3 (HCC) Repeat 2D echo from 12/24/2021 showed LV ejection fraction of 40 to 45%.  Concentric LVH noted, currently on IV Lasix 80 twice daily.  Continue Lipitor, aspirin, metoprolol.  Continue CHF protocol.  Cardiology on board.  Will follow recommendations.  Hypokalemia Improved after replacement.  Potassium of 4.1 today.  Left atrial thrombus Left atrial appendage thrombus.  Cardiology on board.  On Coumadin.  INR is therapeutic at  2.8.  Wild-type transthyretin-related (ATTR) amyloidosis (HCC) -Continue Tafamidis  Persistent atrial fibrillation (HCC) Continue beta-blocker and Coumadin.  Continue to monitor INR.  Rate controlled at this time.  CKD (chronic kidney disease) stage 4, GFR 15-29 ml/min (HCC) Latest creatinine of 2.1.  We will continue to monitor renal function.  On Farxiga, IV Lasix.   OSA on CPAP -Continue CPAP  Hypothyroidism -Continue Synthroid    DVT prophylaxis:  warfarin (COUMADIN) tablet 5 mg   Code Status:     Code Status: Full Code  Disposition: Home  Status is: Inpatient  The patient is  inpatient because: IV diuretics, decompensated heart failure   Family Communication:  Spoke with the patient's granddaughter at bedside on 12/26/2021  Consultants:  Cardiology  Procedures:  None  Antimicrobials:  None  Anti-infectives (From admission, onward)    None      Subjective: Today, patient was seen and examined at bedside.  Patient denies any chest pain today but had little sharp pain yesterday.  Breathing seems to have improved.  No fever chills or rigor.  Has mild cough  Objective: Vitals:   12/25/21 1957 12/26/21 0533 12/26/21 0900 12/26/21 1133  BP: (!) 151/79 116/67  111/69  Pulse: 85 70 79 77  Resp: 18 18  18   Temp: (!) 97.5 F (36.4 C) 98 F (36.7 C)  98.5 F (36.9 C)  TempSrc: Oral Oral  Oral  SpO2:  99%  98%  Weight:  100.2 kg    Height:        Intake/Output Summary (Last 24 hours) at 12/26/2021 1136 Last data filed at 12/26/2021 0900 Gross per 24  hour  Intake 480 ml  Output 1000 ml  Net -520 ml   Filed Weights   12/24/21 1700 12/25/21 0500 12/26/21 0533  Weight: 103.1 kg 100.4 kg 100.2 kg   Body mass index is 40.4 kg/m.   Physical Examination:  General: Obese built, not in obvious distress HENT:   No scleral pallor or icterus noted. Oral mucosa is moist.  Chest:  .  Diminished breath sounds bilaterally.  CVS: S1 &S2 heard. No murmur.   Regular rate and rhythm. Abdomen: Soft, nontender, mild abdominal distention, bowel sounds are heard.   Extremities: No cyanosis, clubbing with trace lower extremity edema, peripheral pulses are palpable. Psych: Alert, awake and oriented, normal mood CNS:  No cranial nerve deficits.  Power equal in all extremities.   Skin: Warm and dry.  No rashes noted.   Data Reviewed:   CBC: Recent Labs  Lab 12/24/21 1022 12/25/21 0151  WBC 6.8 6.6  HGB 12.3 12.8  HCT 39.5 39.2  MCV 91.4 90.5  PLT 275 765    Basic Metabolic Panel: Recent Labs  Lab 12/24/21 1022 12/25/21 0151 12/26/21 0047  NA 139 140 138  K 3.4* 3.1* 4.1  CL 99 99 101  CO2 31 32 29  GLUCOSE 156* 119* 103*  BUN 43* 41* 48*  CREATININE 2.12* 1.91* 2.12*  CALCIUM 9.1 9.0 8.9  MG  --   --  2.1    Liver Function Tests: No results for input(s): AST, ALT, ALKPHOS, BILITOT, PROT, ALBUMIN in the last 168 hours.   Radiology Studies: ECHOCARDIOGRAM COMPLETE  Result Date: 12/24/2021    ECHOCARDIOGRAM REPORT   Patient Name:   Regina [name] Date of Exam: 12/24/2021 Medical Rec #:  [id number]         Height:       62.0 in Accession #:    [id number]        Weight:       227.4 lb Date of Birth:  [birth year]        BSA:          2.019 m Patient Age:    61 years          BP:           148/82 mmHg Patient Gender: F                 HR:           85 bpm. Exam Location:  Inpatient Procedure: 2D Echo Indications:    chest pain  History:        Patient has prior history of Echocardiogram examinations, most                 recent 04/21/2021. CAD, chronic kidney disease, Arrythmias:Atrial                 Fibrillation; Risk Factors:Hypertension, Dyslipidemia, Sleep                 Apnea and Diabetes.  Sonographer:    Johny Chess RDCS Referring Phys: [id number] ADAM CURATOLO IMPRESSIONS  1. Left ventricular ejection fraction, by estimation, is 40 to 45%. The left ventricle has mildly decreased function. The left ventricle demonstrates global  hypokinesis. There is mild concentric left ventricular hypertrophy. Left ventricular diastolic parameters are indeterminate.  2. Right ventricular systolic function is low normal. The right ventricular size is normal. Mildly increased right ventricular wall thickness. There is normal pulmonary artery systolic pressure.  3. Left atrial size was  mildly dilated.  4. The mitral valve is normal in structure. Mild mitral valve regurgitation. No evidence of mitral stenosis.  5. The aortic valve was not well visualized. Aortic valve regurgitation is not visualized. No aortic stenosis is present. FINDINGS  Left Ventricle: Left ventricular ejection fraction, by estimation, is 40 to 45%. The left ventricle has mildly decreased function. The left ventricle demonstrates global hypokinesis. The left ventricular internal cavity size was normal in size. There is  mild concentric left ventricular hypertrophy. Left ventricular diastolic parameters are indeterminate. Right Ventricle: The right ventricular size is normal. Mildly increased right ventricular wall thickness. Right ventricular systolic function is low normal. There is normal pulmonary artery systolic pressure. The tricuspid regurgitant velocity is 2.59 m/s, and with an assumed right atrial pressure of 3 mmHg, the estimated right ventricular systolic pressure is 59.9 mmHg. Left Atrium: Left atrial size was mildly dilated. Right Atrium: Right atrial size was normal in size. Pericardium: Trivial pericardial effusion is present. Mitral Valve: The mitral valve is normal in structure. Mild mitral valve regurgitation. No evidence of mitral valve stenosis. Tricuspid Valve: The tricuspid valve is normal in structure. Tricuspid valve regurgitation is mild . No evidence of tricuspid stenosis. Aortic Valve: The aortic valve was not well visualized. Aortic valve regurgitation is not visualized. No aortic stenosis is present. Pulmonic Valve: The pulmonic valve was not well visualized.  Pulmonic valve regurgitation is not visualized. Aorta: The aortic root is normal in size and structure. IAS/Shunts: No atrial level shunt detected by color flow Doppler.  LEFT VENTRICLE PLAX 2D LVIDd:         4.80 cm LVIDs:         3.40 cm LV PW:         1.10 cm LV IVS:        1.00 cm LVOT diam:     1.90 cm LV SV:         34 LV SV Index:   17 LVOT Area:     2.84 cm  RIGHT VENTRICLE            IVC RV S prime:     9.83 cm/s  IVC diam: 1.40 cm TAPSE (M-mode): 1.1 cm LEFT ATRIUM             Index        RIGHT ATRIUM           Index LA diam:        3.80 cm 1.88 cm/m   RA Area:     18.10 cm LA Vol (A2C):   60.7 ml 30.07 ml/m  RA Volume:   52.00 ml  25.76 ml/m LA Vol (A4C):   72.5 ml 35.91 ml/m LA Biplane Vol: 69.8 ml 34.57 ml/m  AORTIC VALVE LVOT Vmax:   61.80 cm/s LVOT Vmean:  41.400 cm/s LVOT VTI:    0.121 m  AORTA Ao Asc diam: 3.30 cm TRICUSPID VALVE TR Peak grad:   26.8 mmHg TR Vmax:        259.00 cm/s  SHUNTS Systemic VTI:  0.12 m Systemic Diam: 1.90 cm Rudean Haskell MD Electronically signed by Rudean Haskell MD Signature Date/Time: 12/24/2021/5:52:32 PM    Final       LOS: 0 days    Flora Lipps, MD [facility] 12/26/2021, 11:36 AM

## 2021-12-26 NOTE — Progress Notes (Signed)
Progress Note  Patient Name: Regina [name] Date of Encounter: 12/26/2021  [facility] Cardiologist: Evalina Field, MD   Subjective   Pt denies wooziness    Chest still a little tight laying doiwn   No severe SOB   Inpatient Medications    Scheduled Meds:  allopurinol  100 mg Oral q morning   aspirin EC  81 mg Oral Daily   atorvastatin  20 mg Oral QHS   dapagliflozin propanediol  10 mg Oral Daily   docusate sodium  100 mg Oral BID   famotidine  20 mg Oral QHS   furosemide  80 mg Intravenous BID   gabapentin  600 mg Oral BID   insulin aspart  0-15 Units Subcutaneous TID WC   insulin aspart  0-5 Units Subcutaneous QHS   insulin glargine-yfgn  26 Units Subcutaneous QHS   levothyroxine  150 mcg Oral Q0600   magnesium oxide  400 mg Oral q morning   metoprolol succinate  25 mg Oral Daily   potassium chloride  40 mEq Oral BID   sodium chloride flush  3 mL Intravenous Q12H   Tafamidis  61 mg Oral Daily   warfarin  5 mg Oral Q Sun   And   warfarin  2.5 mg Oral Once per day on Mon Tue Wed Thu Fri Sat   Warfarin - Pharmacist Dosing Inpatient   Does not apply q1600   Continuous Infusions:  PRN Meds: acetaminophen **OR** acetaminophen, bisacodyl, ondansetron **OR** ondansetron (ZOFRAN) IV, polyethylene glycol, polyvinyl alcohol, traZODone   Vital Signs    Vitals:   12/25/21 0900 12/25/21 1600 12/25/21 1957 12/26/21 0533  BP: 137/75 135/74 (!) 151/79 116/67  Pulse:  75 85 70  Resp:   18 18  Temp:   (!) 97.5 F (36.4 C) 98 F (36.7 C)  TempSrc:   Oral Oral  SpO2:    99%  Weight:    100.2 kg  Height:        Intake/Output Summary (Last 24 hours) at 12/26/2021 0729 Last data filed at 12/25/2021 1932 Gross per 24 hour  Intake 600 ml  Output 1501 ml  Net -901 ml   Last 3 Weights 12/26/2021 12/25/2021 12/24/2021  Weight (lbs) 220 lb 14.4 oz 221 lb 4.8 oz 227 lb 4.7 oz  Weight (kg) 100.2 kg 100.381 kg 103.1 kg      Telemetry    ? Afib   70s  - Personally  Reviewed   Physical Exam   GEN: No acute distress.   Neck: Neck is full.   Cardiac: RRR, no murmurs, Respiratory: Clear to auscultation  GI: Soft, nontender.  MS: No edema; No deformity. Neuro:  Nonfocal  Psych: Normal affect   Labs    High Sensitivity Troponin:   Recent Labs  Lab 12/24/21 1022 12/24/21 1315  TROPONINIHS 85* 79*     Chemistry Recent Labs  Lab 12/24/21 1022 12/25/21 0151 12/26/21 0047  NA 139 140 138  K 3.4* 3.1* 4.1  CL 99 99 101  CO2 31 32 29  GLUCOSE 156* 119* 103*  BUN 43* 41* 48*  CREATININE 2.12* 1.91* 2.12*  CALCIUM 9.1 9.0 8.9  MG  --   --  2.1  GFRNONAA 23* 27* 23*  ANIONGAP '9 9 8    '$ Lipids No results for input(s): CHOL, TRIG, HDL, LABVLDL, LDLCALC, CHOLHDL in the last 168 hours.  Hematology Recent Labs  Lab 12/24/21 1022 12/25/21 0151  WBC 6.8 6.6  RBC 4.32  4.33  HGB 12.3 12.8  HCT 39.5 39.2  MCV 91.4 90.5  MCH 28.5 29.6  MCHC 31.1 32.7  RDW 14.2 14.3  PLT 275 269   Thyroid No results for input(s): TSH, FREET4 in the last 168 hours.  BNP Recent Labs  Lab 12/24/21 1123  BNP 548.1*    DDimer  Recent Labs  Lab 12/24/21 1036  DDIMER 0.43     Radiology    DG Chest 2 View  Result Date: 12/24/2021 CLINICAL DATA:  Chest pain EXAM: CHEST - 2 VIEW COMPARISON:  11/08/2021 FINDINGS: Bilateral mild interstitial thickening. No pleural effusion or pneumothorax. Stable cardiomegaly. No acute osseous abnormality. Right shoulder arthroplasty. IMPRESSION: 1. Cardiomegaly with mild pulmonary vascular congestion. Electronically Signed   By: Kathreen Devoid M.D.   On: 12/24/2021 10:41   ECHOCARDIOGRAM COMPLETE  Result Date: 12/24/2021    ECHOCARDIOGRAM REPORT   Patient Name:   Regina [name] Date of Exam: 12/24/2021 Medical Rec #:  [id number]         Height:       62.0 in Accession #:    [id number]        Weight:       227.4 lb Date of Birth:  1941/09/28        BSA:          2.019 m Patient Age:    81 years          BP:            148/82 mmHg Patient Gender: F                 HR:           85 bpm. Exam Location:  Inpatient Procedure: 2D Echo Indications:    chest pain  History:        Patient has prior history of Echocardiogram examinations, most                 recent 04/21/2021. CAD, chronic kidney disease, Arrythmias:Atrial                 Fibrillation; Risk Factors:Hypertension, Dyslipidemia, Sleep                 Apnea and Diabetes.  Sonographer:    Johny Chess RDCS Referring Phys: [id number] ADAM CURATOLO IMPRESSIONS  1. Left ventricular ejection fraction, by estimation, is 40 to 45%. The left ventricle has mildly decreased function. The left ventricle demonstrates global hypokinesis. There is mild concentric left ventricular hypertrophy. Left ventricular diastolic parameters are indeterminate.  2. Right ventricular systolic function is low normal. The right ventricular size is normal. Mildly increased right ventricular wall thickness. There is normal pulmonary artery systolic pressure.  3. Left atrial size was mildly dilated.  4. The mitral valve is normal in structure. Mild mitral valve regurgitation. No evidence of mitral stenosis.  5. The aortic valve was not well visualized. Aortic valve regurgitation is not visualized. No aortic stenosis is present. FINDINGS  Left Ventricle: Left ventricular ejection fraction, by estimation, is 40 to 45%. The left ventricle has mildly decreased function. The left ventricle demonstrates global hypokinesis. The left ventricular internal cavity size was normal in size. There is  mild concentric left ventricular hypertrophy. Left ventricular diastolic parameters are indeterminate. Right Ventricle: The right ventricular size is normal. Mildly increased right ventricular wall thickness. Right ventricular systolic function is low normal. There is normal pulmonary artery systolic pressure. The tricuspid regurgitant velocity is 2.59 m/s, and  with an assumed right atrial pressure of 3 mmHg, the estimated  right ventricular systolic pressure is 25.6 mmHg. Left Atrium: Left atrial size was mildly dilated. Right Atrium: Right atrial size was normal in size. Pericardium: Trivial pericardial effusion is present. Mitral Valve: The mitral valve is normal in structure. Mild mitral valve regurgitation. No evidence of mitral valve stenosis. Tricuspid Valve: The tricuspid valve is normal in structure. Tricuspid valve regurgitation is mild . No evidence of tricuspid stenosis. Aortic Valve: The aortic valve was not well visualized. Aortic valve regurgitation is not visualized. No aortic stenosis is present. Pulmonic Valve: The pulmonic valve was not well visualized. Pulmonic valve regurgitation is not visualized. Aorta: The aortic root is normal in size and structure. IAS/Shunts: No atrial level shunt detected by color flow Doppler.  LEFT VENTRICLE PLAX 2D LVIDd:         4.80 cm LVIDs:         3.40 cm LV PW:         1.10 cm LV IVS:        1.00 cm LVOT diam:     1.90 cm LV SV:         34 LV SV Index:   17 LVOT Area:     2.84 cm  RIGHT VENTRICLE            IVC RV S prime:     9.83 cm/s  IVC diam: 1.40 cm TAPSE (M-mode): 1.1 cm LEFT ATRIUM             Index        RIGHT ATRIUM           Index LA diam:        3.80 cm 1.88 cm/m   RA Area:     18.10 cm LA Vol (A2C):   60.7 ml 30.07 ml/m  RA Volume:   52.00 ml  25.76 ml/m LA Vol (A4C):   72.5 ml 35.91 ml/m LA Biplane Vol: 69.8 ml 34.57 ml/m  AORTIC VALVE LVOT Vmax:   61.80 cm/s LVOT Vmean:  41.400 cm/s LVOT VTI:    0.121 m  AORTA Ao Asc diam: 3.30 cm TRICUSPID VALVE TR Peak grad:   26.8 mmHg TR Vmax:        259.00 cm/s  SHUNTS Systemic VTI:  0.12 m Systemic Diam: 1.90 cm Rudean Haskell MD Electronically signed by Rudean Haskell MD Signature Date/Time: 12/24/2021/5:52:32 PM    Final     Cardiac Studies     Patient Profile  AMERIAH LINT is a 81 y.o. female with a hx of persistent afib, LAA thrombus, cardiac amyloidosis/HFpEF, DM, CKD stage IV, hypothyroidism  who is being seen today for the evaluation of chest pain at the request of Dr. Lennice Sites, DO.    Assessment & Plan    1.  Chest tightness, abdominal fullness. Pt more comfortable but still gets tightness when laying flat    I/O not accurate   Exam difficult   Overall seems OK  I would recomm R heart cath to confirm filling pressures   Renal function 2.12 today    Hold further diuretic today   2.  CAD.  Last cardiac catheterization 2016 showed moderate RCA lesion and LAD lesion.  RElatively flat trop    I would not recomm L heart cath for now     Only if R sided pressures are low    3.  "Spell".  Patient Had "woozy" spell yesterday   None  since   FOllow   3.  Persistent atrial fibrillation.  Rates controlled.  4.  Left atrial appendage thrombus.  Patient has been maintained on Coumadin.  Had clot on Eliquis.   INR is 2.8 today    Hold tonight and check in AM   SHould be OK given R heart cath only    5.  Chronic kidney disease.  Followed by Dr. Osborne Casco as an outpatient. Creatinine 2.12 today. For questions or updates, please contact [place] Please consult [email or web address] for contact info under        Signed, Dorris Carnes, MD  12/26/2021, 7:29 AM

## 2021-12-27 ENCOUNTER — Encounter (HOSPITAL_COMMUNITY): Payer: Self-pay | Admitting: Cardiovascular Disease

## 2021-12-27 ENCOUNTER — Inpatient Hospital Stay (HOSPITAL_COMMUNITY)
Admission: EM | Disposition: A | Discharge status: Home / Self Care | Payer: Self-pay | Source: Home / Self Care | Attending: Internal Medicine

## 2021-12-27 DIAGNOSIS — I272 Pulmonary hypertension, unspecified: Secondary | ICD-10-CM

## 2021-12-27 HISTORY — PX: RIGHT HEART CATH: CATH118263

## 2021-12-27 LAB — POCT I-STAT EG7
Acid-Base Excess: 5 mmol/L — ABNORMAL HIGH (ref 0.0–2.0)
Acid-Base Excess: 5 mmol/L — ABNORMAL HIGH (ref 0.0–2.0)
Bicarbonate: 31.7 mmol/L — ABNORMAL HIGH (ref 20.0–28.0)
Bicarbonate: 31.7 mmol/L — ABNORMAL HIGH (ref 20.0–28.0)
Calcium, Ion: 1.24 mmol/L (ref 1.15–1.40)
Calcium, Ion: 1.22 mmol/L (ref 1.15–1.40)
HCT: 37 % (ref 36.0–46.0)
HCT: 37 % (ref 36.0–46.0)
Hemoglobin: 12.6 g/dL (ref 12.0–15.0)
Hemoglobin: 12.6 g/dL (ref 12.0–15.0)
O2 Saturation: 59 %
O2 Saturation: 62 %
Potassium: 4.2 mmol/L (ref 3.5–5.1)
Potassium: 4.2 mmol/L (ref 3.5–5.1)
Sodium: 141 mmol/L (ref 135–145)
Sodium: 142 mmol/L (ref 135–145)
TCO2: 33 mmol/L — ABNORMAL HIGH (ref 22–32)
TCO2: 33 mmol/L — ABNORMAL HIGH (ref 22–32)
pCO2, Ven: 53.8 mmHg (ref 44–60)
pCO2, Ven: 53.8 mmHg (ref 44–60)
pH, Ven: 7.379 (ref 7.25–7.43)
pH, Ven: 7.379 (ref 7.25–7.43)
pO2, Ven: 32 mmHg (ref 32–45)
pO2, Ven: 33 mmHg (ref 32–45)

## 2021-12-27 LAB — CBC
HCT: 38 % (ref 36.0–46.0)
Hemoglobin: 12.4 g/dL (ref 12.0–15.0)
MCH: 29.5 pg (ref 26.0–34.0)
MCHC: 32.6 g/dL (ref 30.0–36.0)
MCV: 90.3 fL (ref 80.0–100.0)
Platelets: 261 10*3/uL (ref 150–400)
RBC: 4.21 MIL/uL (ref 3.87–5.11)
RDW: 14.5 % (ref 11.5–15.5)
WBC: 6.1 10*3/uL (ref 4.0–10.5)
nRBC: 0 % (ref 0.0–0.2)

## 2021-12-27 LAB — GLUCOSE, CAPILLARY
Glucose-Capillary: 73 mg/dL (ref 70–99)
Glucose-Capillary: 235 mg/dL — ABNORMAL HIGH (ref 70–99)
Glucose-Capillary: 114 mg/dL — ABNORMAL HIGH (ref 70–99)
Glucose-Capillary: 147 mg/dL — ABNORMAL HIGH (ref 70–99)

## 2021-12-27 LAB — BASIC METABOLIC PANEL
Anion gap: 9 (ref 5–15)
BUN: 46 mg/dL — ABNORMAL HIGH (ref 8–23)
CO2: 28 mmol/L (ref 22–32)
Calcium: 9.1 mg/dL (ref 8.9–10.3)
Chloride: 103 mmol/L (ref 98–111)
Creatinine, Ser: 2.01 mg/dL — ABNORMAL HIGH (ref 0.44–1.00)
GFR, Estimated: 25 mL/min — ABNORMAL LOW (ref >60)
Glucose, Bld: 74 mg/dL (ref 70–99)
Potassium: 4.2 mmol/L (ref 3.5–5.1)
Sodium: 140 mmol/L (ref 135–145)

## 2021-12-27 LAB — PROTIME-INR
INR: 2.5 — ABNORMAL HIGH (ref 0.8–1.2)
Prothrombin Time: 26.8 seconds — ABNORMAL HIGH (ref 11.4–15.2)

## 2021-12-27 LAB — MAGNESIUM: Magnesium: 2.3 mg/dL (ref 1.7–2.4)

## 2021-12-27 LAB — SEDIMENTATION RATE: Sed Rate: 83 mm/hr — ABNORMAL HIGH (ref 0–22)

## 2021-12-27 SURGERY — RIGHT HEART CATH
Anesthesia: LOCAL

## 2021-12-27 MED ORDER — WARFARIN SODIUM 2.5 MG PO TABS
2.5000 mg | ORAL_TABLET | Freq: Once | ORAL | Status: AC
  Administered 2021-12-27: 2.5 mg via ORAL
  Filled 2021-12-27: qty 1

## 2021-12-27 MED ORDER — SODIUM CHLORIDE 0.9 % IV SOLN: 250.0000 mL | INTRAVENOUS | Status: DC | PRN

## 2021-12-27 MED ORDER — SODIUM CHLORIDE 0.9% FLUSH: 3.0000 mL | INTRAVENOUS | Status: DC | PRN

## 2021-12-27 MED ORDER — MIDAZOLAM HCL 2 MG/2ML IJ SOLN
INTRAMUSCULAR | Status: AC
  Filled 2021-12-27: qty 2

## 2021-12-27 MED ORDER — SODIUM CHLORIDE 0.9% FLUSH
3.0000 mL | Freq: Two times a day (BID) | INTRAVENOUS | Status: DC
  Administered 2021-12-27 – 2021-12-28 (×2): 3 mL via INTRAVENOUS

## 2021-12-27 MED ORDER — HYDRALAZINE HCL 20 MG/ML IJ SOLN: 10.0000 mg | INTRAMUSCULAR | Status: AC | PRN

## 2021-12-27 MED ORDER — LABETALOL HCL 5 MG/ML IV SOLN: 10.0000 mg | INTRAVENOUS | Status: AC | PRN

## 2021-12-27 MED ORDER — HEPARIN (PORCINE) IN NACL 1000-0.9 UT/500ML-% IV SOLN
INTRAVENOUS | Status: AC
  Filled 2021-12-27: qty 500

## 2021-12-27 MED ORDER — HEPARIN (PORCINE) IN NACL 1000-0.9 UT/500ML-% IV SOLN
INTRAVENOUS | Status: DC | PRN
  Administered 2021-12-27: 500 mL

## 2021-12-27 MED ORDER — SODIUM CHLORIDE 0.9 % IV SOLN: INTRAVENOUS | Status: AC

## 2021-12-27 MED ORDER — FENTANYL CITRATE (PF) 100 MCG/2ML IJ SOLN
INTRAMUSCULAR | Status: DC | PRN
  Administered 2021-12-27: 25 ug via INTRAVENOUS

## 2021-12-27 MED ORDER — LIDOCAINE HCL (PF) 1 % IJ SOLN
INTRAMUSCULAR | Status: AC
  Filled 2021-12-27: qty 30

## 2021-12-27 MED ORDER — FENTANYL CITRATE (PF) 100 MCG/2ML IJ SOLN
INTRAMUSCULAR | Status: AC
  Filled 2021-12-27: qty 2

## 2021-12-27 MED ORDER — ONDANSETRON HCL 4 MG/2ML IJ SOLN: 4.0000 mg | Freq: Four times a day (QID) | INTRAMUSCULAR | Status: DC | PRN

## 2021-12-27 MED ORDER — ACETAMINOPHEN 325 MG PO TABS: 650.0000 mg | ORAL_TABLET | ORAL | Status: DC | PRN

## 2021-12-27 MED ORDER — INSULIN GLARGINE-YFGN 100 UNIT/ML ~~LOC~~ SOLN
20.0000 [IU] | Freq: Every day | SUBCUTANEOUS | Status: DC
  Administered 2021-12-27: 20 [IU] via SUBCUTANEOUS
  Filled 2021-12-27 (×2): qty 0.2

## 2021-12-27 MED ORDER — MIDAZOLAM HCL 2 MG/2ML IJ SOLN
INTRAMUSCULAR | Status: DC | PRN
  Administered 2021-12-27: 1 mg via INTRAVENOUS

## 2021-12-27 SURGICAL SUPPLY — 6 items
CATH BALLN WEDGE 5F 110CM (CATHETERS) ×2 IMPLANT
PACK CARDIAC CATHETERIZATION (CUSTOM PROCEDURE TRAY) ×2 IMPLANT
SHEATH GLIDE SLENDER 4/5FR (SHEATH) ×2 IMPLANT
TRANSDUCER W/STOPCOCK (MISCELLANEOUS) ×2 IMPLANT
TUBING ART PRESS 72  MALE/MALE (TUBING) ×2 IMPLANT
WIRE EMERALD 3MM-J .025X260CM (WIRE) ×2 IMPLANT

## 2021-12-27 NOTE — Progress Notes (Signed)
Inpatient Diabetes Program Recommendations ? ?AACE/ADA: New Consensus Statement on Inpatient Glycemic Control (2015) ? ?Target Ranges:  Prepandial:   less than 140 mg/dL ?     Peak postprandial:   less than 180 mg/dL (1-2 hours) ?     Critically ill patients:  140 - 180 mg/dL  ? ?Lab Results  ?Component Value Date  ? GLUCAP 73 12/27/2021  ? HGBA1C 7.6 (A) 08/13/2021  ? ? ?Review of Glycemic Control ? Latest Reference Range & Units 12/26/21 07:30 12/26/21 11:32 12/26/21 16:00 12/26/21 20:51 12/27/21 07:42  ?Glucose-Capillary 70 - 99 mg/dL 145 (H) 108 (H) 170 (H) 197 (H) 73  ?(H): Data is abnormally high ? ?Diabetes history: DM2 ? ?Outpatient Diabetes medications: Toujeo 26 units QD, Humalog 0-8 units TID and Farxiga 10 mg QD ? ?Current orders for Inpatient glycemic control: Semglee 26 units QHS, Novolog 0-15 units TID and 0-5 units QHS, Farxiga 10 mg QD ? ?Inpatient Diabetes Program Recommendations:   ? ?Fasting CBG was 73 mg/dL.  Please consider decreasing Semglee 20 units QHS.   ? ?Will continue to follow while inpatient. ? ?Thank you, ?Reche Dixon, MSN, RN ?Diabetes Coordinator ?Inpatient Diabetes Program ?[phone] (team pager from 8a-5p) ? ? ? ? ?

## 2021-12-27 NOTE — Plan of Care (Signed)
?  Problem: Activity: ?Goal: Capacity to carry out activities will improve ?Outcome: Progressing ?  ?

## 2021-12-27 NOTE — Progress Notes (Signed)
Heart Failure Navigator Progress Note ? ?Assessed for Heart & Vascular TOC clinic readiness.  ?Patient does not meet criteria due to Prior to hospitalization, pt established with AHF clinic, Dr. Jacinto Reap, on 09/16/20. .  ? ?Patient has scheduled FU with AHF APP clinic on 01/12/22 ? ?Earnestine Leys, BSN, RN ?Heart Failure Nurse Navigator ?[phone]   ?

## 2021-12-27 NOTE — Progress Notes (Signed)
?PROGRESS NOTE ? ? ? ?Regina [name]  IOX:[id number] DOB: 29-Aug-1941 DOA: 12/24/2021 ?PCP: [name], [name], MD  ? ? ?Brief Narrative:  ?Regina [name] is a 81 y.o. female with medical history significant of cardiac amyloidosis with chronic combined CHF; DM; HTN; hypothyroidism; CAD; atrial thrombus on Coumadin; and OSA presenting with chest pain presented to hospital with chest discomfort pressure for 1 week.  Patient had taken some nitroglycerin without much improvement.  In the ED, patient had mild elevated troponin.  Cardiology was consulted and patient was admitted hospital for further evaluation and treatment.  During hospitalization, patient was seen by cardiology and underwent IV diuresis.  Patient also underwent right-sided cardiac catheterization on 12/27/2021.  ? ?  ?Assessment and Plan: ?* Chest pain with high risk for cardiac etiology ?History of CAD.  Last catheterization on 2016 showed moderate RCA lesion and LAD lesion.  Intermittent chest pressure for 1 week with some pleuritic component.  Chest x-ray with vascular congestion.  Mild elevated troponins with renal dysfunction .EKG not indicative of acute ischemia.    Continue aspirin.  ESR mildly elevated at 83.  2D echocardiogram with LV ejection fraction of 40 to 45% with global hypokinesis.  Seen by cardiology and underwent right sided cardiac catheterization with notable elevated pulmonary artery pressure.  Continue diuresis.  Further management plan as per cardiology.  Patient continues to feel tightness. ? ?Essential HTN ?-Continue Toprol blood pressure seems to be better. ? ?Hyperlipidemia ?-Continue Lipitor ? ?DM type II. ?-Recent hemoglobin A1c was 7.6,, continue sliding scale insulin, long-acting insulin.  Closely monitor.   ? ? ?Acute on chronic diastolic CHF (congestive heart failure), NYHA class 3 ([place]) ?Repeat 2D echo from 12/24/2021 showed LV ejection fraction of 40 to 45%.  Concentric LVH noted, currently on IV Lasix 80 twice daily.   We will continue diuresis.  Continue Lipitor, aspirin, metoprolol.  Continue CHF protocol.  Cardiology on board.  Will follow recommendations.  Patient is negative balance for 1281 ML. ? ?Hypokalemia ?Improved after replacement.  Potassium of 4.2 today. ? ?Left atrial thrombus ?Left atrial appendage thrombus.  Cardiology on board.  On Coumadin.  INR is therapeutic at 2.5. ? ?Wild-type transthyretin-related (ATTR) amyloidosis ([place]) ?-Continue Tafamidis ? ?Persistent atrial fibrillation ([place]) ?Continue beta-blocker and Coumadin.  Continue to monitor INR.  Rate controlled at this time. ? ?CKD (chronic kidney disease) stage 4, GFR 15-29 ml/min (HCC) ?Latest creatinine of 2.1.  On IV Lasix.  Continue to monitor renal function. ? ?OSA on CPAP ?-Continue CPAP ? ?Hypothyroidism ?-Continue Synthroid ? ? ? DVT prophylaxis:  ? ? ?Code Status:   ?  Code Status: Full Code ? ?Disposition: Home ? ?Status is: Inpatient ? ?The patient is  inpatient because: IV diuretics, decompensated heart failure ? ? Family Communication:  ?Spoke with the patient's granddaughter at bedside on 12/26/2021, spoke with the patient's son at bedside on 12/27/2021 ? ?Consultants:  ?Cardiology ? ?Procedures:  ?Right sided cardiac catheterization on 12/27/2021 ? ?Antimicrobials:  ?None ? ?Anti-infectives (From admission, onward)  ? ? None  ? ?  ? ?Subjective: ?Today, patient was seen and examined at bedside.  Still complains of mild chest pressure.  Denies dyspnea.  Is able to walk to the bathroom.  Seen after cardiac catheterization today. ? ?Objective: ?Vitals:  ? 12/27/21 0515 12/27/21 0841 12/27/21 1024 12/27/21 1231  ?BP: 128/66  102/60 106/70  ?Pulse: 83  84 82  ?Resp:    16  ?Temp: 98.2 ?F (36.8 ?C)  98 ?F (36.7 ?C)  ?TempSrc: Oral   Oral  ?SpO2:  97%  98%  ?Weight: 101.7 kg     ?Height:      ? ? ?Intake/Output Summary (Last 24 hours) at 12/27/2021 1343 ?Last data filed at 12/26/2021 2345 ?Gross per 24 hour  ?Intake 480 ml  ?Output 300 ml  ?Net 180 ml   ? ?Filed Weights  ? 12/25/21 0500 12/26/21 0533 12/27/21 0515  ?Weight: 100.4 kg 100.2 kg 101.7 kg  ? ?Body mass index is 41.01 kg/m?.  ? ?Physical Examination: ? ?General: Obese built, not in obvious distress ?HENT:   No scleral pallor or icterus noted. Oral mucosa is moist.  ?Chest:  Clear breath sounds.  Diminished breath sounds bilaterally. No crackles or wheezes.  ?CVS: S1 &S2 heard. No murmur.  Regular rate and rhythm. ?Abdomen: Soft, nontender, mildly distended abdomen.  Bowel sounds are heard.   ?Extremities: No cyanosis, clubbing with trace peripheral edema.  Peripheral pulses are palpable. ?Psych: Alert, awake and oriented, normal mood ?CNS:  No cranial nerve deficits.  Power equal in all extremities.   ?Skin: Warm and dry.  No rashes noted. ? ? ?Data Reviewed:  ? ?CBC: ?Recent Labs  ?Lab 12/24/21 ?1022 12/25/21 ?0151 12/27/21 ?8546 12/27/21 ?2703 12/27/21 ?0908  ?WBC 6.8 6.6 6.1  --   --   ?HGB 12.3 12.8 12.4 12.6 12.6  ?HCT 39.5 39.2 38.0 37.0 37.0  ?MCV 91.4 90.5 90.3  --   --   ?PLT 275 269 261  --   --   ? ? ?Basic Metabolic Panel: ?Recent Labs  ?Lab 12/24/21 ?1022 12/25/21 ?0151 12/26/21 ?5009 12/27/21 ?3818 12/27/21 ?2993 12/27/21 ?7169  ?NA 139 140 138 140 141 142  ?K 3.4* 3.1* 4.1 4.2 4.2 4.2  ?CL 99 99 101 103  --   --   ?CO2 31 32 29 28  --   --   ?GLUCOSE 156* 119* 103* 74  --   --   ?BUN 43* 41* 48* 46*  --   --   ?CREATININE 2.12* 1.91* 2.12* 2.01*  --   --   ?CALCIUM 9.1 9.0 8.9 9.1  --   --   ?MG  --   --  2.1 2.3  --   --   ? ? ?Liver Function Tests: ?No results for input(s): AST, ALT, ALKPHOS, BILITOT, PROT, ALBUMIN in the last 168 hours. ? ? ?Radiology Studies: ?CARDIAC CATHETERIZATION ? ?Result Date: 12/27/2021 ?Images from the original result were not included. Regina [name] is a 81 y.o. female  [id number] LOCATION:  FACILITY: [facility] PHYSICIAN: Quay Burow, M.D. 1941/04/08 DATE OF PROCEDURE:  12/27/2021 DATE OF DISCHARGE: CARDIAC CATHETERIZATION History obtained from chart  review.Regina [name] is a 81 y.o. female with a hx of persistent afib, LAA thrombus, cardiac amyloidosis/HFpEF, DM, CKD stage IV, hypothyroidism who is being seen today for the evaluation of chest pain at the request of Dr. Lennice Sites, DO.  Her INR is 2.5 today.  Her serum creatinine is around 2.  She still has signs of volume overload clinically.  She is was referred for right heart cath. HEMODYNAMICS: Right heart cath 1: Right atrial pressure-12/12 2: Right ventricular pressure-60/3 3: Pulmonary artery pressure-66/28, mean 34 4: Pulmonary wedge pressure-A-wave equals 21, V wave equals 35, mean equals 26 5: Cardiac output-4.66 L/min with an index of 2.32 L/min/m?   ? ?Ms. Scalisi has elevated filling pressures with a relatively high V wave and pulmonary artery hypertension.  She  will need additional diuresis.  The sheath was removed and a pressure dressing was placed on the right antecubital fossa to achieve hemostasis.  The patient left lab in stable condition. Quay Burow. MD, [facility] 12/27/2021 9:19 AM    ? ? ? LOS: 1 day  ? ? ?Flora Lipps, MD ?[facility] ?12/27/2021, 1:43 PM  ?  ?

## 2021-12-27 NOTE — Progress Notes (Signed)
? ?Progress Note ? ?Patient Name: Regina [name] ?Date of Encounter: 12/27/2021 ? ?[facility] Cardiologist: Evalina Field, MD  ? ?Subjective  ? ?No complaints spoke with son at bedside INR pending  ? ?Inpatient Medications  ?  ?Scheduled Meds: ? allopurinol  100 mg Oral q morning  ? aspirin EC  81 mg Oral Daily  ? atorvastatin  20 mg Oral QHS  ? dapagliflozin propanediol  10 mg Oral Daily  ? docusate sodium  100 mg Oral BID  ? famotidine  20 mg Oral QHS  ? furosemide  80 mg Intravenous BID  ? gabapentin  600 mg Oral BID  ? insulin aspart  0-15 Units Subcutaneous TID WC  ? insulin aspart  0-5 Units Subcutaneous QHS  ? insulin glargine-yfgn  26 Units Subcutaneous QHS  ? levothyroxine  150 mcg Oral Q0600  ? magnesium oxide  400 mg Oral q morning  ? metoprolol succinate  25 mg Oral Daily  ? sodium chloride flush  3 mL Intravenous Q12H  ? Tafamidis  61 mg Oral Daily  ? Warfarin - Pharmacist Dosing Inpatient   Does not apply T6256  ? ?Continuous Infusions: ? sodium chloride    ? sodium chloride    ? ?PRN Meds: ?sodium chloride, acetaminophen **OR** acetaminophen, bisacodyl, ondansetron **OR** ondansetron (ZOFRAN) IV, polyethylene glycol, polyvinyl alcohol, sodium chloride flush, traZODone  ? ?Vital Signs  ?  ?Vitals:  ? 12/26/21 0900 12/26/21 1133 12/26/21 1900 12/27/21 0515  ?BP:  111/69 (!) 148/92 128/66  ?Pulse: 79 77  83  ?Resp:  18    ?Temp:  98.5 ?F (36.9 ?C) 97.9 ?F (36.6 ?C) 98.2 ?F (36.8 ?C)  ?TempSrc:  Oral Oral Oral  ?SpO2:  98%    ?Weight:    101.7 kg  ?Height:      ? ? ?Intake/Output Summary (Last 24 hours) at 12/27/2021 0816 ?Last data filed at 12/26/2021 2345 ?Gross per 24 hour  ?Intake 960 ml  ?Output 1800 ml  ?Net -840 ml  ? ?Last 3 Weights 12/27/2021 12/26/2021 12/25/2021  ?Weight (lbs) 224 lb 3.3 oz 220 lb 14.4 oz 221 lb 4.8 oz  ?Weight (kg) 101.7 kg 100.2 kg 100.381 kg  ?   ? ?Telemetry  ?  ?Fib flutter rates 80-90  ? ? ?Physical Exam  ? ?Affect appropriate ?Healthy:  appears stated age ?HEENT:  normal ?Neck supple with no adenopathy ?JVP normal no bruits no thyromegaly ?Lungs clear with no wheezing and good diaphragmatic motion ?Heart:  S1/S2 no murmur, no rub, gallop or click ?PMI normal ?Abdomen: benighn, BS positve, no tenderness, no AAA ?no bruit.  No HSM or HJR ?Distal pulses intact with no bruits ?No edema ?Neuro non-focal ?Skin warm and dry ?No muscular weakness ? ? ?Labs  ?  ?High Sensitivity Troponin:   ?Recent Labs  ?Lab 12/24/21 ?1022 12/24/21 ?1315  ?TROPONINIHS 85* 79*  ?   ?Chemistry ?Recent Labs  ?Lab 12/25/21 ?0151 12/26/21 ?3893 12/27/21 ?7342  ?NA 140 138 140  ?K 3.1* 4.1 4.2  ?CL 99 101 103  ?CO2 32 29 28  ?GLUCOSE 119* 103* 74  ?BUN 41* 48* 46*  ?CREATININE 1.91* 2.12* 2.01*  ?CALCIUM 9.0 8.9 9.1  ?MG  --  2.1 2.3  ?GFRNONAA 27* 23* 25*  ?ANIONGAP '9 8 9  '$ ?  ?Lipids No results for input(s): CHOL, TRIG, HDL, LABVLDL, LDLCALC, CHOLHDL in the last 168 hours.  ?Hematology ?Recent Labs  ?Lab 12/24/21 ?1022 12/25/21 ?0151 12/27/21 ?0657  ?WBC 6.8  6.6 6.1  ?RBC 4.32 4.33 4.21  ?HGB 12.3 12.8 12.4  ?HCT 39.5 39.2 38.0  ?MCV 91.4 90.5 90.3  ?MCH 28.5 29.6 29.5  ?MCHC 31.1 32.7 32.6  ?RDW 14.2 14.3 14.5  ?PLT 275 269 261  ? ?Thyroid No results for input(s): TSH, FREET4 in the last 168 hours.  ?BNP ?Recent Labs  ?Lab 12/24/21 ?1123  ?BNP 548.1*  ?  ?DDimer  ?Recent Labs  ?Lab 12/24/21 ?1036  ?DDIMER 0.43  ?  ? ?Radiology  ?  ?No results found. ? ?Cardiac Studies  ? ? ? ?Patient Profile  ?Regina [name] is a 81 y.o. female with a hx of persistent afib, LAA thrombus, cardiac amyloidosis/HFpEF, DM, CKD stage IV, hypothyroidism who is being seen today for the evaluation of chest pain at the request of Dr. Lennice Sites, DO. ?  ? ?Assessment & Plan  ?  ?1.  Chest tightness, abdominal fullness. R/O see below Dr Harrington Challenger wanted right heart cath to assess pulmonary pressures and PCWP  Troponin flat no trend or high peak no acute ECG changes no plans for cath given CRF Had normal myovue 08/14/20  ? ?2.  CAD.   Last cardiac catheterization 2016 showed moderate RCA lesion and LAD lesion.  Per Dr Harrington Challenger no cath given CRF  ? ?3.  Persistent atrial fibrillation.  Rates controlled. ? ?4.  Left atrial appendage thrombus.  Patient has been maintained on Coumadin.  Had clot on Eliquis.  INR 2.5 today will let cath lab know likely do right heart cath in am  ? ?5.  Chronic kidney disease.  Followed by Dr. Osborne Casco as an outpatient. ?Creatinine 2.01 today  ? ?  ?   ?Signed, ?Jenkins Rouge, MD  ?12/27/2021, 8:16 AM   ? ?

## 2021-12-27 NOTE — Interval H&P Note (Signed)
Cath Lab Visit (complete for each Cath Lab visit) ? ?Clinical Evaluation Leading to the Procedure:  ? ?ACS: No. ? ?Non-ACS:   ? ?Anginal Classification: No Symptoms ? ?Anti-ischemic medical therapy: Minimal Therapy (1 class of medications) ? ?Non-Invasive Test Results: No non-invasive testing performed ? ?Prior CABG: No previous CABG ? ? ? ? ? ?History and Physical Interval Note: ? ?12/27/2021 ?8:38 AM ? ?Regina [name]  has presented today for surgery, with the diagnosis of HF.  The various methods of treatment have been discussed with the patient and family. After consideration of risks, benefits and other options for treatment, the patient has consented to  Procedure(s): ?RIGHT HEART CATH (N/A) as a surgical intervention.  The patient's history has been reviewed, patient examined, no change in status, stable for surgery.  I have reviewed the patient's chart and labs.  Questions were answered to the patient's satisfaction.   ? ? ?Regina [name] ? ? ?

## 2021-12-27 NOTE — H&P (View-Only) (Signed)
? ?Progress Note ? ?Patient Name: Regina [name] ?Date of Encounter: 12/27/2021 ? ?[facility] Cardiologist: Evalina Field, MD  ? ?Subjective  ? ?No complaints spoke with son at bedside INR pending  ? ?Inpatient Medications  ?  ?Scheduled Meds: ? allopurinol  100 mg Oral q morning  ? aspirin EC  81 mg Oral Daily  ? atorvastatin  20 mg Oral QHS  ? dapagliflozin propanediol  10 mg Oral Daily  ? docusate sodium  100 mg Oral BID  ? famotidine  20 mg Oral QHS  ? furosemide  80 mg Intravenous BID  ? gabapentin  600 mg Oral BID  ? insulin aspart  0-15 Units Subcutaneous TID WC  ? insulin aspart  0-5 Units Subcutaneous QHS  ? insulin glargine-yfgn  26 Units Subcutaneous QHS  ? levothyroxine  150 mcg Oral Q0600  ? magnesium oxide  400 mg Oral q morning  ? metoprolol succinate  25 mg Oral Daily  ? sodium chloride flush  3 mL Intravenous Q12H  ? Tafamidis  61 mg Oral Daily  ? Warfarin - Pharmacist Dosing Inpatient   Does not apply W2376  ? ?Continuous Infusions: ? sodium chloride    ? sodium chloride    ? ?PRN Meds: ?sodium chloride, acetaminophen **OR** acetaminophen, bisacodyl, ondansetron **OR** ondansetron (ZOFRAN) IV, polyethylene glycol, polyvinyl alcohol, sodium chloride flush, traZODone  ? ?Vital Signs  ?  ?Vitals:  ? 12/26/21 0900 12/26/21 1133 12/26/21 1900 12/27/21 0515  ?BP:  111/69 (!) 148/92 128/66  ?Pulse: 79 77  83  ?Resp:  18    ?Temp:  98.5 ?F (36.9 ?C) 97.9 ?F (36.6 ?C) 98.2 ?F (36.8 ?C)  ?TempSrc:  Oral Oral Oral  ?SpO2:  98%    ?Weight:    101.7 kg  ?Height:      ? ? ?Intake/Output Summary (Last 24 hours) at 12/27/2021 0816 ?Last data filed at 12/26/2021 2345 ?Gross per 24 hour  ?Intake 960 ml  ?Output 1800 ml  ?Net -840 ml  ? ?Last 3 Weights 12/27/2021 12/26/2021 12/25/2021  ?Weight (lbs) 224 lb 3.3 oz 220 lb 14.4 oz 221 lb 4.8 oz  ?Weight (kg) 101.7 kg 100.2 kg 100.381 kg  ?   ? ?Telemetry  ?  ?Fib flutter rates 80-90  ? ? ?Physical Exam  ? ?Affect appropriate ?Healthy:  appears stated age ?HEENT:  normal ?Neck supple with no adenopathy ?JVP normal no bruits no thyromegaly ?Lungs clear with no wheezing and good diaphragmatic motion ?Heart:  S1/S2 no murmur, no rub, gallop or click ?PMI normal ?Abdomen: benighn, BS positve, no tenderness, no AAA ?no bruit.  No HSM or HJR ?Distal pulses intact with no bruits ?No edema ?Neuro non-focal ?Skin warm and dry ?No muscular weakness ? ? ?Labs  ?  ?High Sensitivity Troponin:   ?Recent Labs  ?Lab 12/24/21 ?1022 12/24/21 ?1315  ?TROPONINIHS 85* 79*  ?   ?Chemistry ?Recent Labs  ?Lab 12/25/21 ?0151 12/26/21 ?2831 12/27/21 ?5176  ?NA 140 138 140  ?K 3.1* 4.1 4.2  ?CL 99 101 103  ?CO2 32 29 28  ?GLUCOSE 119* 103* 74  ?BUN 41* 48* 46*  ?CREATININE 1.91* 2.12* 2.01*  ?CALCIUM 9.0 8.9 9.1  ?MG  --  2.1 2.3  ?GFRNONAA 27* 23* 25*  ?ANIONGAP '9 8 9  '$ ?  ?Lipids No results for input(s): CHOL, TRIG, HDL, LABVLDL, LDLCALC, CHOLHDL in the last 168 hours.  ?Hematology ?Recent Labs  ?Lab 12/24/21 ?1022 12/25/21 ?0151 12/27/21 ?0657  ?WBC 6.8  6.6 6.1  ?RBC 4.32 4.33 4.21  ?HGB 12.3 12.8 12.4  ?HCT 39.5 39.2 38.0  ?MCV 91.4 90.5 90.3  ?MCH 28.5 29.6 29.5  ?MCHC 31.1 32.7 32.6  ?RDW 14.2 14.3 14.5  ?PLT 275 269 261  ? ?Thyroid No results for input(s): TSH, FREET4 in the last 168 hours.  ?BNP ?Recent Labs  ?Lab 12/24/21 ?1123  ?BNP 548.1*  ?  ?DDimer  ?Recent Labs  ?Lab 12/24/21 ?1036  ?DDIMER 0.43  ?  ? ?Radiology  ?  ?No results found. ? ?Cardiac Studies  ? ? ? ?Patient Profile  ?Regina [name] is a 81 y.o. female with a hx of persistent afib, LAA thrombus, cardiac amyloidosis/HFpEF, DM, CKD stage IV, hypothyroidism who is being seen today for the evaluation of chest pain at the request of Dr. Lennice Sites, DO. ?  ? ?Assessment & Plan  ?  ?1.  Chest tightness, abdominal fullness. R/O see below Dr Harrington Challenger wanted right heart cath to assess pulmonary pressures and PCWP  Troponin flat no trend or high peak no acute ECG changes no plans for cath given CRF Had normal myovue 08/14/20  ? ?2.  CAD.   Last cardiac catheterization 2016 showed moderate RCA lesion and LAD lesion.  Per Dr Harrington Challenger no cath given CRF  ? ?3.  Persistent atrial fibrillation.  Rates controlled. ? ?4.  Left atrial appendage thrombus.  Patient has been maintained on Coumadin.  Had clot on Eliquis.  INR 2.5 today will let cath lab know likely do right heart cath in am  ? ?5.  Chronic kidney disease.  Followed by Dr. Osborne Casco as an outpatient. ?Creatinine 2.01 today  ? ?  ?   ?Signed, ?Jenkins Rouge, MD  ?12/27/2021, 8:16 AM   ? ?

## 2021-12-27 NOTE — Progress Notes (Signed)
Pt has home CPAP, no assistance needed.  

## 2021-12-27 NOTE — Progress Notes (Signed)
ANTICOAGULATION CONSULT NOTE  ?Pharmacy Consult for warfarin (taking PTA) ?Indication:  VTE treatment ? ?Allergies  ?Allergen Reactions  ? Ace Inhibitors Other (See Comments)  ?  unknown  ? Amlodipine Other (See Comments)  ?  unknown  ? Atenolol Other (See Comments)  ?  bradycardia  ? Avandia [Rosiglitazone] Other (See Comments)  ?  unknown  ? Darvon [Propoxyphene] Other (See Comments)  ?  unknown  ? Erythromycin Itching  ? Hydralazine Other (See Comments)  ?  Burning in throat and chest  ? Hydrocodone Other (See Comments)  ?  Hallucinations.  ? Levofloxacin Itching  ? Morphine And Related Other (See Comments)  ?  Dizzy and hallucianation, vomiting; ?Willing to try low dose  ? Percocet [Oxycodone-Acetaminophen] Other (See Comments)  ?  hallucination  ? Spironolactone Other (See Comments)  ?  unknown  ? Tramadol Other (See Comments)  ?  Unknown/does not recall reaction but does not want to take again  ? ? ?Patient Measurements: ?Height: '5\' 2"'$  (157.5 cm) ?Weight: 101.7 kg (224 lb 3.3 oz) ?IBW/kg (Calculated) : 50.1 ?Heparin Dosing Weight: 74.8 kg ? ?Vital Signs: ?Temp: 98 ?F (36.7 ?C) (03/13 1231) ?Temp Source: Oral (03/13 1231) ?BP: 106/70 (03/13 1231) ?Pulse Rate: 82 (03/13 1231) ? ?Labs: ?Recent Labs  ?  12/24/21 ?1315 12/25/21 ?0151 12/25/21 ?0151 12/26/21 ?1540 12/27/21 ?0867 12/27/21 ?0710 12/27/21 ?6195 12/27/21 ?0932  ?HGB  --  12.8   < >  --  12.4  --  12.6 12.6  ?HCT  --  39.2   < >  --  38.0  --  37.0 37.0  ?PLT  --  269  --   --  261  --   --   --   ?LABPROT  --  28.9*  --  29.6*  --  26.8*  --   --   ?INR  --  2.7*  --  2.8*  --  2.5*  --   --   ?CREATININE  --  1.91*  --  2.12* 2.01*  --   --   --   ?TROPONINIHS 79*  --   --   --   --   --   --   --   ? < > = values in this interval not displayed.  ? ? ? ?Estimated Creatinine Clearance: 25.7 mL/min (A) (by C-G formula based on SCr of 2.01 mg/dL (H)). ? ? ?Medical History: ?Past Medical History:  ?Diagnosis Date  ? Back pain   ? CHF (congestive heart  failure) ([place])   ? 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 Chronic combined systolic and diastolic CHF (congestive heart failure) ([place])   ? Diabetes mellitus without complication ([place])   ? GERD (gastroesophageal reflux disease)   ? Heart disease   ? Hypertension   ? Hypothyroidism   ? Joint pain   ? Mini stroke   ? Non-obstructive CAD   ? a. 01/2015 Cardiolite: + inf wall ischemia, EF 56%;  b. 01/2015 Cath: LM nl, LAD 107m LCX min irregs, RCA dominant, 50-670m ? Sleep apnea   ? Swallowing difficulty   ? ? ?Assessment: ?PMH of cardiac amyloidosis with chronic combined CHF, DM, HTN, hypothyroidism, CAD, persistent Afib. Patient on warfarin for atrial thrombus. Patient presented with chest pain, heaviness, and pressure. Last outpatient coag visit was 11/23/21 with INR of 2.6. INR today is therapeutic at 2.8. CBC stable. Patient reported last dose 12/23/21.  ? ?PTA regimen (per 11/23/21 anticoag visit):  5 mg on Sun, 2.5 mg AOD ? ?INR 2.5 today, dose held last night to ensure not too high for RHC today.  Now s/p procedure, no overt bleeding or complications noted. ? ?Goal of Therapy:  ?INR 2-3 ?  ?Plan:  ?Warfarin 2.5 mg x 1 tonight. ?Daily INR ?Weekly CBC ? ?Thank you for allowing pharmacy to participate in this patient's care. ? ?[place] Crane, Pharm D, BCPS, BCCP ?Clinical Pharmacist ? 12/27/2021 1:58 PM  ? ?[facility] pharmacy phone numbers are listed on [email or web address] ? ? ? ? ?

## 2021-12-28 LAB — CBC
HCT: 39.9 % (ref 36.0–46.0)
Hemoglobin: 12.5 g/dL (ref 12.0–15.0)
MCH: 28.3 pg (ref 26.0–34.0)
MCHC: 31.3 g/dL (ref 30.0–36.0)
MCV: 90.5 fL (ref 80.0–100.0)
Platelets: 269 10*3/uL (ref 150–400)
RBC: 4.41 MIL/uL (ref 3.87–5.11)
RDW: 14.4 % (ref 11.5–15.5)
WBC: 7.6 10*3/uL (ref 4.0–10.5)
nRBC: 0 % (ref 0.0–0.2)

## 2021-12-28 LAB — BASIC METABOLIC PANEL
Anion gap: 9 (ref 5–15)
BUN: 43 mg/dL — ABNORMAL HIGH (ref 8–23)
CO2: 30 mmol/L (ref 22–32)
Calcium: 9.2 mg/dL (ref 8.9–10.3)
Chloride: 100 mmol/L (ref 98–111)
Creatinine, Ser: 1.93 mg/dL — ABNORMAL HIGH (ref 0.44–1.00)
GFR, Estimated: 26 mL/min — ABNORMAL LOW (ref >60)
Glucose, Bld: 103 mg/dL — ABNORMAL HIGH (ref 70–99)
Potassium: 4 mmol/L (ref 3.5–5.1)
Sodium: 139 mmol/L (ref 135–145)

## 2021-12-28 LAB — PROTIME-INR
INR: 2.1 — ABNORMAL HIGH (ref 0.8–1.2)
Prothrombin Time: 23.8 seconds — ABNORMAL HIGH (ref 11.4–15.2)

## 2021-12-28 LAB — GLUCOSE, CAPILLARY
Glucose-Capillary: 72 mg/dL (ref 70–99)
Glucose-Capillary: 175 mg/dL — ABNORMAL HIGH (ref 70–99)

## 2021-12-28 LAB — MAGNESIUM: Magnesium: 2.4 mg/dL (ref 1.7–2.4)

## 2021-12-28 MED ORDER — FUROSEMIDE 40 MG PO TABS: 80.0000 mg | ORAL_TABLET | Freq: Two times a day (BID) | ORAL | Status: DC

## 2021-12-28 MED ORDER — FUROSEMIDE 80 MG PO TABS
80.0000 mg | ORAL_TABLET | Freq: Two times a day (BID) | ORAL | 2 refills | Status: DC
Start: 2021-12-28 — End: 2022-01-04

## 2021-12-28 MED ORDER — PNEUMOCOCCAL 20-VAL CONJ VACC 0.5 ML IM SUSY
0.5000 mL | PREFILLED_SYRINGE | INTRAMUSCULAR | Status: AC
  Administered 2021-12-28: 0.5 mL via INTRAMUSCULAR
  Filled 2021-12-28: qty 0.5

## 2021-12-28 MED ORDER — ASPIRIN 81 MG PO TBEC: 81.0000 mg | DELAYED_RELEASE_TABLET | Freq: Every day | ORAL | 11 refills | Status: DC

## 2021-12-28 MED FILL — Lidocaine HCl Local Preservative Free (PF) Inj 1%: INTRAMUSCULAR | Qty: 30 | Status: AC

## 2021-12-28 NOTE — TOC Initial Note (Signed)
Transition of Care (TOC) - Initial/Assessment Note  ? ? ?Patient Details  ?Name: Regina [name] ?MRN: [id number] ?Date of Birth: October 19, 1940 ? ?Transition of Care (TOC) CM/SW Contact:    ?Graves-Bigelow, Ocie Cornfield, RN ?Phone Number: ?12/28/2021, 11:49 AM ? ?Clinical Narrative:  Risk for readmission assessment completed. Case Manager received Heart Failure Consult. PTA patient was from home with support of family. Patient reports that she has transportation to appointments and pharmacy. Patient states she obtains medications without any issues. Case Manager discussed home health needs for the patient and she states she is pretty independent. Patient states she will not need HH Services at this time. Patient is aware to contact her PCP for any future needs. Family will provide transportation home via private vehicle.      ? ? ?Expected Discharge Plan: Home/Self Care ?Barriers to Discharge: No Barriers Identified ? ? ?Patient Goals and CMS Choice ?Patient states their goals for this hospitalization and ongoing recovery are:: to return home ?  ?Choice offered to / list presented to : NA ? ?Expected Discharge Plan and Services ?Expected Discharge Plan: Home/Self Care ?In-house Referral: NA ?Discharge Planning Services: CM Consult ?Post Acute Care Choice: NA ?Living arrangements for the past 2 months: Apartment ?Expected Discharge Date: 12/28/21               ?DME Arranged: N/A ?DME Agency: NA ?  ?  ?  ?HH Arranged: NA ?  ?  ?  ?  ? ?Prior Living Arrangements/Services ?Living arrangements for the past 2 months: Apartment ?Lives with:: Self, Relatives ?Patient language and need for interpreter reviewed:: Yes ?Do you feel safe going back to the place where you live?: Yes      ?Need for Family Participation in Patient Care: Yes (Comment) ?Care giver support system in place?: Yes (comment) ?  ?Criminal Activity/Legal Involvement Pertinent to Current Situation/Hospitalization: No - Comment as needed ? ?Activities of Daily  Living ?Home Assistive Devices/Equipment: None ?ADL Screening (condition at time of admission) ?Patient's cognitive ability adequate to safely complete daily activities?: Yes ?Is the patient deaf or have difficulty hearing?: No ?Does the patient have difficulty seeing, even when wearing glasses/contacts?: No ?Does the patient have difficulty concentrating, remembering, or making decisions?: No ?Patient able to express need for assistance with ADLs?: Yes ?Does the patient have difficulty dressing or bathing?: No ?Independently performs ADLs?: Yes (appropriate for developmental age) ?Does the patient have difficulty walking or climbing stairs?: Yes ?Weakness of Legs: None ?Weakness of Arms/Hands: None ? ?Permission Sought/Granted ?Permission sought to share information with : Family Supports, Customer service manager, Case Manager ?  ?   ?   ?   ?   ? ?Emotional Assessment ?Appearance:: Appears stated age ?Attitude/Demeanor/Rapport: Engaged ?Affect (typically observed): Appropriate ?Orientation: : Oriented to  Time, Oriented to Place, Oriented to Self, Oriented to Situation ?Alcohol / Substance Use: Not Applicable ?Psych Involvement: No (comment) ? ?Admission diagnosis:  Chest pain with high risk for cardiac etiology [R07.9] ?Chest pain, unspecified type [R07.9] ?Chest pain [R07.9] ?Patient Active Problem List  ? Diagnosis Date Noted  ? Chest pain 12/26/2021  ? Left atrial thrombus 12/25/2021  ? Wild-type transthyretin-related (ATTR) amyloidosis ([place])   ? Lung nodules 12/13/2021  ? Polymyalgia rheumatica ([place]) 06/30/2021  ? Neck pain 06/18/2021  ? Gait abnormality 06/18/2021  ? Memory loss 06/18/2021  ? Atherosclerosis of aorta ([place]) 06/09/2021  ? Type 2 diabetes mellitus with stage 3a chronic kidney disease, with long-term current use of insulin ([place]) 09/14/2020  ?  Type 2 diabetes mellitus with retinopathy, with long-term current use of insulin ([place]) 09/14/2020  ? Diabetes mellitus ([place]) 09/14/2020  ? Type 2  diabetes mellitus with diabetic polyneuropathy, with long-term current use of insulin ([place]) 09/14/2020  ? Long term (current) use of anticoagulants 07/13/2020  ? Persistent atrial fibrillation ([place])   ? Mixed hyperlipidemia   ? Acute on chronic diastolic CHF (congestive heart failure), NYHA class 3 ([place])   ? Demand ischemia ([place])   ? Atrial fibrillation ([place]) 04/24/2020  ? Other fatigue 03/24/2020  ? Short of breath on exertion 03/24/2020  ? Congestive heart failure ([place]) 03/24/2020  ? Vitamin D deficiency 03/24/2020  ? Sleep apnea 05/21/2019  ? History of hepatitis C 05/21/2019  ? Insomnia 05/21/2019  ? CKD (chronic kidney disease) stage 4, GFR 15-29 ml/min (HCC) 05/21/2019  ? GERD (gastroesophageal reflux disease) 04/20/2018  ? Type 2 diabetes mellitus with diabetic neuropathy, unspecified ([place]) 12/19/2016  ? History of TIA (transient ischemic attack) 12/19/2016  ? Hyperlipidemia associated with type 2 diabetes mellitus ([place]) 12/19/2016  ? S/P total knee replacement using cement, right 03/18/2015  ? Non-obstructive CAD   ? Cardiovascular stress test abnormal   ? Pleuritic chest pain 01/16/2015  ? Acute renal failure superimposed on stage 3 chronic kidney disease ([place]) 01/16/2015  ? Obesity, Class III, BMI 40-49.9 (morbid obesity) ([place]) 01/16/2015  ? Essential hypertension 01/16/2015  ? Hypothyroidism 01/16/2015  ? OSA on CPAP 01/16/2015  ? DM type 2 (diabetes mellitus, type 2) ([place]) 01/16/2015  ? Coronary artery disease involving native coronary artery without angina pectoris 10/02/2014  ? Physical deconditioning 02/28/2013  ? Morbid obesity ([place]) 02/27/2013  ? Osteoarthritis 02/27/2013  ? ?PCP:  [name], [name], MD ?Pharmacy:   ?[facility] - [place], [place] - [address] ?[address] ?[place] [address] ?Phone: [phone] Fax: [phone] ? ?Readmission Risk Interventions ?Readmission Risk Prevention Plan 12/28/2021  ?Transportation Screening Complete  ?PCP or  Specialist Appt within 3-5 Days Complete  ?[place] or Home Care Consult Complete  ?Social Work Consult for [place] Planning/Counseling Complete  ?Palliative Care Screening Not Applicable  ?Medication Review Press photographer) Complete  ?Some recent data might be hidden  ? ? ? ?

## 2021-12-28 NOTE — Consult Note (Signed)
? ?  [facility] CM Inpatient Consult ? ? ?12/28/2021 ? ?[name] ?[birth year] ?[id number] ? ?[facility] [ACO] Patient: [employer] ? ?Primary Care Provider:  [name], [name], MD is an embedded provider with a Chronic Care Management team and program, and is listed for the transition of care follow up and appointments. ? ?Patient was screened for Embedded practice service needs for chronic care management that patient has been active with CCM pharmacist.  Met with patient at the bedside and explained about chronic care management follow up for hospitalized patients.  She endorses CCM with pharmacist and '"wouldn't mind have the nurse follow up as well." ? ?Plan: A referral  request will be sent to the [facility] and made aware of TOC needs for post hospital needs. Updated inpatient [facility] RNCM of plan for post hospital follow up. ? ?Please contact for further questions, ? ?Natividad Brood, RN BSN CCM ?[facility] Liaison ? [phone] business mobile phone ?Toll free office [phone]  ?Fax number: [phone] ?[email or web address] ? ? ? ?

## 2021-12-28 NOTE — Plan of Care (Signed)
?  Problem: Education: ?Goal: Ability to demonstrate management of disease process will improve ?Outcome: Adequate for Discharge ?  ?

## 2021-12-28 NOTE — Discharge Summary (Signed)
?Physician Discharge Summary ?  ?Patient: Regina [name] MRN: [id number] DOB: [birth year]  ?Admit date:     12/24/2021  ?Discharge date: 12/28/21  ?Discharge Physician: Corrie Mckusick Jillianne Gamino  ? ?PCP: [name], [name], MD  ? ?Recommendations at discharge:  ? ?Follow-up with your primary care physician in 1 week.  Check CBC, BMP magnesium and LFT at that time. ?Follow-up with cardiology as an outpatient as scheduled by the clinic. ?Continue Coumadin with INR monitoring. ? ?Discharge Diagnoses: ?Active Problems: ?  Acute on chronic diastolic CHF (congestive heart failure), NYHA class 3 (HCC) ?  Left atrial thrombus ?  Essential hypertension ?  Hypothyroidism ?  OSA on CPAP ?  CKD (chronic kidney disease) stage 4, GFR 15-29 ml/min (HCC) ?  Mixed hyperlipidemia ?  Persistent atrial fibrillation ([place]) ?  Type 2 diabetes mellitus with diabetic polyneuropathy, with long-term current use of insulin ([place]) ?  Wild-type transthyretin-related (ATTR) amyloidosis ([place]) ?  Chest pain ? ?Principal Problem (Resolved): ?  Chest pain with high risk for cardiac etiology ?Resolved Problems: ?  Hypokalemia ? ?Hospital Course: ?Regina [name] is a 81 y.o. female with medical history significant of cardiac amyloidosis with chronic combined CHF; DM; HTN; hypothyroidism; CAD; atrial thrombus on Coumadin; and OSA presented to the hospital with chest pain/pressure for 1 week.  Patient had taken some nitroglycerin without much improvement.  In the ED, patient had mild elevated troponin.  Cardiology was consulted and patient was admitted hospital for further evaluation and treatment.  During hospitalization, patient was seen by cardiology and underwent IV diuresis.  Patient also underwent right-sided cardiac catheterization on 12/27/2021. ? ?Assessment and Plan: ?* Chest pain with high risk for cardiac etiology-resolved as of 12/28/2021 ?History of CAD.  Last catheterization on 2016 showed moderate RCA lesion and LAD lesion.  Intermittent chest  pressure for 1 week with some pleuritic component presentation..  Chest x-ray with vascular congestion.  Mild elevated troponins with renal dysfunction as well on presentation. EKG was not indicative of acute ischemia.      2D echocardiogram showed LV ejection fraction of 40 to 45% with global hypokinesis.  Seen by cardiology and underwent right sided cardiac catheterization with notable elevated pulmonary artery pressure.  At this time cardiology has recommended diuresis with 80 mg of Lasix twice daily.  Patient was on torsemide at home which will be discontinued.  We will follow-up with cardiology as outpatient. ? ?Essential HTN ?-Continue Toprol  ? ?Hyperlipidemia ?-Continue Lipitor ? ?DM type II. ?-Recent hemoglobin A1c was 7.6.  Patient is on Toujeo and Humalog as outpatient.  Continue [place] Glade ? ? ?Acute on chronic diastolic CHF (congestive heart failure), NYHA class 3 ([place]) ?Repeat 2D echo from 12/24/2021 showed LV ejection fraction of 40 to 45%.  Concentric LVH noted, currently on IV Lasix 80 twice daily.  We will continue Lasix on discharge as per cardiology..  Continue Lipitor, aspirin, metoprolol.  Patient was negative balance for 4981 mL during hospitalization. ? ?Hypokalemia-resolved as of 12/28/2021 ?Improved after replacement.  Potassium of 4.0 prior to discharge. ? ?Left atrial thrombus ?Left atrial appendage thrombus.   Patient was continued on Coumadin during hospitalization.  INR was therapeutic. ? ?Wild-type transthyretin-related (ATTR) amyloidosis ([place]) ?-Continue Tafamidis ? ?Persistent atrial fibrillation ([place]) ?Continue beta-blocker and Coumadin on discharge.  Rate controlled at this time. ? ?CKD (chronic kidney disease) stage 4, GFR 15-29 ml/min (HCC) ?Latest creatinine of 1.9 from 2.1.  Received IV Lasix during hospitalization which will be changed to 80 mg of  p.o. Lasix twice daily on discharge as per cardiology recommendation.  Would recommend monitoring of renal function as outpatient   ? ?OSA on CPAP ?-Continue CPAP ? ?Hypothyroidism ?-Continue Synthroid ? ? ?Consultants: Cardiology ? ?Procedures performed: Right heart catheterization on 12/27/2021 ? ?Disposition: Home. Spoke with the patient's son Mr Randall Hiss and the phone and updated him about the clinical condition of the patient plan for disposition and follow-up..  ? ?Diet recommendation:  ?Discharge Diet Orders (From admission, onward)  ? ?  Start     Ordered  ? 12/28/21 0000  Diet - low sodium heart healthy       ?Comments: Low carb diet. Fluid restriction 1541m/day  ? 12/28/21 1119  ? ?  ?  ? ?  ? ?Cardiac and Carb modified diet ? ?DISCHARGE MEDICATION: ?Allergies as of 12/28/2021   ? ?   Reactions  ? Ace Inhibitors Other (See Comments)  ? unknown  ? Amlodipine Other (See Comments)  ? unknown  ? Atenolol Other (See Comments)  ? bradycardia  ? Avandia [rosiglitazone] Other (See Comments)  ? unknown  ? Darvon [propoxyphene] Other (See Comments)  ? unknown  ? Erythromycin Itching  ? Hydralazine Other (See Comments)  ? Burning in throat and chest  ? Hydrocodone Other (See Comments)  ? Hallucinations.  ? Levofloxacin Itching  ? Morphine And Related Other (See Comments)  ? Dizzy and hallucianation, vomiting; ?Willing to try low dose  ? Percocet [oxycodone-acetaminophen] Other (See Comments)  ? hallucination  ? Spironolactone Other (See Comments)  ? unknown  ? Tramadol Other (See Comments)  ? Unknown/does not recall reaction but does not want to take again  ? ?  ? ?  ?Medication List  ?  ? ?STOP taking these medications   ? ?tiZANidine 4 MG tablet ?Commonly known as: Zanaflex ?  ?torsemide 20 MG tablet ?Commonly known as: DEMADEX ?  ? ?  ? ?TAKE these medications   ? ?Accu-Chek FLucent TechnologiesKit ?Use to test blood sugar up to 3 times daily ?  ?Accu-Chek Guide test strip ?Generic drug: glucose blood ?Use to test blood sugar up to 3 times daily ?  ?Accu-Chek Guide w/Device Kit ?1 Device by Does not apply route 3 (three) times daily. ?  ?allopurinol  100 MG tablet ?Commonly known as: ZYLOPRIM ?TAKE ONE TABLET BY MOUTH EVERY MORNING ?  ?aspirin 81 MG EC tablet ?Take 1 tablet (81 mg total) by mouth daily. Swallow whole. ?Start taking on: December 29, 2021 ?  ?atorvastatin 20 MG tablet ?Commonly known as: LIPITOR ?TAKE ONE TABLET BY MOUTH EVERYDAY AT BEDTIME ?  ?B-D SINGLE USE SWABS REGULAR Pads ?Use to test blood sugar up to 3 times daily ?  ?Capsaicin 0.033 % Crea ?Apply 1 application topically 3 (three) times daily. ?  ?Dexcom G6 Sensor Misc ?1 Device by Does not apply route as directed. ?  ?Dexcom G6 Transmitter Misc ?1 Device by Does not apply route as directed. ?  ?famotidine 20 MG tablet ?Commonly known as: PEPCID ?Take 1 tablet (20 mg total) by mouth at bedtime. ?  ?Farxiga 10 MG Tabs tablet ?Generic drug: dapagliflozin propanediol ?Take 1 tablet (10 mg total) by mouth daily. ?  ?furosemide 80 MG tablet ?Commonly known as: LASIX ?Take 1 tablet (80 mg total) by mouth 2 (two) times daily. ?  ?gabapentin 600 MG tablet ?Commonly known as: NEURONTIN ?TAKE ONE TABLET BY MOUTH EVERY MORNING and TAKE ONE TABLET BY MOUTH EVERYDAY AT BEDTIME ?  ?insulin lispro 100  UNIT/ML KwikPen ?Commonly known as: HumaLOG KwikPen ?Max daily 15 units ?What changed:  ?how much to take ?when to take this ?additional instructions ?  ?Insulin Pen Needle 32G X 4 MM Misc ?1 Device by Does not apply route in the morning, at noon, in the evening, and at bedtime. ?  ?Comfort EZ Pen Needles 31G X 8 MM Misc ?Generic drug: Insulin Pen Needle ?USE 3 TO 4 times daily as directed ?  ?ipratropium 0.06 % nasal spray ?Commonly known as: ATROVENT ?Place 2 sprays into both nostrils 4 (four) times daily. ?  ?levothyroxine 150 MCG tablet ?Commonly known as: SYNTHROID ?TAKE ONE TABLET BY MOUTH BEFORE BREAKFAST ?  ?magnesium oxide 400 MG tablet ?Commonly known as: MAG-OX ?TAKE ONE TABLET BY MOUTH EVERY MORNING ?  ?meclizine 25 MG tablet ?Commonly known as: ANTIVERT ?Take 25 mg by mouth daily as needed for  dizziness. ?  ?metoprolol succinate 25 MG 24 hr tablet ?Commonly known as: TOPROL-XL ?Take 1 tablet (25 mg total) by mouth daily. Take with or immediately following a meal. ?  ?MIRALAX PO ?Take 1 Package by mouth dai

## 2021-12-28 NOTE — Progress Notes (Signed)
Patient given discharge instructions and stated understanding. 

## 2021-12-28 NOTE — Progress Notes (Signed)
? ?Progress Note ? ?Patient Name: Regina [name] ?Date of Encounter: 12/28/2021 ? ?[facility] Cardiologist: Evalina Field, MD  ? ?Subjective  ? ?No complaints post cath feels she can be d/c today  ? ?Inpatient Medications  ?  ?Scheduled Meds: ? allopurinol  100 mg Oral q morning  ? aspirin EC  81 mg Oral Daily  ? atorvastatin  20 mg Oral QHS  ? dapagliflozin propanediol  10 mg Oral Daily  ? docusate sodium  100 mg Oral BID  ? famotidine  20 mg Oral QHS  ? furosemide  80 mg Intravenous BID  ? gabapentin  600 mg Oral BID  ? insulin aspart  0-15 Units Subcutaneous TID WC  ? insulin aspart  0-5 Units Subcutaneous QHS  ? insulin glargine-yfgn  20 Units Subcutaneous QHS  ? levothyroxine  150 mcg Oral Q0600  ? magnesium oxide  400 mg Oral q morning  ? metoprolol succinate  25 mg Oral Daily  ? sodium chloride flush  3 mL Intravenous Q12H  ? Tafamidis  61 mg Oral Daily  ? Warfarin - Pharmacist Dosing Inpatient   Does not apply O1607  ? ?Continuous Infusions: ? sodium chloride    ? ?PRN Meds: ?sodium chloride, acetaminophen **OR** acetaminophen, acetaminophen, bisacodyl, ondansetron **OR** ondansetron (ZOFRAN) IV, ondansetron (ZOFRAN) IV, polyethylene glycol, polyvinyl alcohol, sodium chloride flush, traZODone  ? ?Vital Signs  ?  ?Vitals:  ? 12/27/21 1024 12/27/21 1231 12/27/21 2030 12/28/21 3710  ?BP: 102/60 106/70 133/87 139/90  ?Pulse: 84 82 83 94  ?Resp:  16    ?Temp:  98 ?F (36.7 ?C) 98.7 ?F (37.1 ?C) 98.1 ?F (36.7 ?C)  ?TempSrc:  Oral Oral Oral  ?SpO2:  98% 98% 97%  ?Weight:    100.5 kg  ?Height:      ? ? ?Intake/Output Summary (Last 24 hours) at 12/28/2021 0943 ?Last data filed at 12/28/2021 6269 ?Gross per 24 hour  ?Intake --  ?Output 3700 ml  ?Net -3700 ml  ? ?Last 3 Weights 12/28/2021 12/27/2021 12/26/2021  ?Weight (lbs) 221 lb 9.6 oz 224 lb 3.3 oz 220 lb 14.4 oz  ?Weight (kg) 100.517 kg 101.7 kg 100.2 kg  ?   ? ?Telemetry  ?  ?Fib flutter rates 80-90  ? ? ?Physical Exam  ? ?Affect appropriate ?Healthy:  appears  stated age ?HEENT: normal ?Neck supple with no adenopathy ?JVP normal no bruits no thyromegaly ?Lungs clear with no wheezing and good diaphragmatic motion ?Heart:  S1/S2 no murmur, no rub, gallop or click ?PMI normal ?Abdomen: benighn, BS positve, no tenderness, no AAA ?no bruit.  No HSM or HJR ?Distal pulses intact with no bruits ?No edema ?Neuro non-focal ?Skin warm and dry ?No muscular weakness ? ? ?Labs  ?  ?High Sensitivity Troponin:   ?Recent Labs  ?Lab 12/24/21 ?1022 12/24/21 ?1315  ?TROPONINIHS 85* 79*  ?   ?Chemistry ?Recent Labs  ?Lab 12/26/21 ?4854 12/27/21 ?6270 12/27/21 ?3500 12/27/21 ?9381 12/28/21 ?0251  ?NA 138 140 141 142 139  ?K 4.1 4.2 4.2 4.2 4.0  ?CL 101 103  --   --  100  ?CO2 29 28  --   --  30  ?GLUCOSE 103* 74  --   --  103*  ?BUN 48* 46*  --   --  43*  ?CREATININE 2.12* 2.01*  --   --  1.93*  ?CALCIUM 8.9 9.1  --   --  9.2  ?MG 2.1 2.3  --   --  2.4  ?GFRNONAA 23* 25*  --   --  26*  ?ANIONGAP 8 9  --   --  9  ?  ?Lipids No results for input(s): CHOL, TRIG, HDL, LABVLDL, LDLCALC, CHOLHDL in the last 168 hours.  ?Hematology ?Recent Labs  ?Lab 12/25/21 ?0151 12/27/21 ?4888 12/27/21 ?9169 12/27/21 ?4503 12/28/21 ?0251  ?WBC 6.6 6.1  --   --  7.6  ?RBC 4.33 4.21  --   --  4.41  ?HGB 12.8 12.4 12.6 12.6 12.5  ?HCT 39.2 38.0 37.0 37.0 39.9  ?MCV 90.5 90.3  --   --  90.5  ?MCH 29.6 29.5  --   --  28.3  ?MCHC 32.7 32.6  --   --  31.3  ?RDW 14.3 14.5  --   --  14.4  ?PLT 269 261  --   --  269  ? ?Thyroid No results for input(s): TSH, FREET4 in the last 168 hours.  ?BNP ?Recent Labs  ?Lab 12/24/21 ?1123  ?BNP 548.1*  ?  ?DDimer  ?Recent Labs  ?Lab 12/24/21 ?1036  ?DDIMER 0.43  ?  ? ?Radiology  ?  ?CARDIAC CATHETERIZATION ? ?Result Date: 12/27/2021 ?Images from the original result were not included. Regina [name] is a 81 y.o. female  [id number] LOCATION:  FACILITY: [facility] PHYSICIAN: Quay Burow, M.D. 1941/05/23 DATE OF PROCEDURE:  12/27/2021 DATE OF DISCHARGE: CARDIAC CATHETERIZATION History obtained  from chart review.Regina [name] is a 81 y.o. female with a hx of persistent afib, LAA thrombus, cardiac amyloidosis/HFpEF, DM, CKD stage IV, hypothyroidism who is being seen today for the evaluation of chest pain at the request of Dr. Lennice Sites, DO.  Her INR is 2.5 today.  Her serum creatinine is around 2.  She still has signs of volume overload clinically.  She is was referred for right heart cath. HEMODYNAMICS: Right heart cath 1: Right atrial pressure-12/12 2: Right ventricular pressure-60/3 3: Pulmonary artery pressure-66/28, mean 34 4: Pulmonary wedge pressure-A-wave equals 21, V wave equals 35, mean equals 26 5: Cardiac output-4.66 L/min with an index of 2.32 L/min/m?   ? ?Regina [name] has elevated filling pressures with a relatively high V wave and pulmonary artery hypertension.  She will need additional diuresis.  The sheath was removed and a pressure dressing was placed on the right antecubital fossa to achieve hemostasis.  The patient left lab in stable condition. Quay Burow. MD, [facility] 12/27/2021 9:19 AM    ? ?Cardiac Studies  ? ? ? ?Patient Profile  ?Regina [name] is a 81 y.o. female with a hx of persistent afib, LAA thrombus, cardiac amyloidosis/HFpEF, DM, CKD stage IV, hypothyroidism who is being seen today for the evaluation of chest pain at the request of Dr. Lennice Sites, DO. ?  ? ?Assessment & Plan  ?  ?1.  Chest tightness, abdominal fullness. R/O see below Dr Harrington Challenger wanted right heart cath to assess pulmonary pressures and PCWP  Troponin flat no trend or high peak no acute ECG changes no plans for cath given CRF Had normal myovue 08/14/20  ? ?2.  CAD.  Last cardiac catheterization 2016 showed moderate RCA lesion and LAD lesion.  Per Dr Harrington Challenger no cath given CRF  ? ?3.  Persistent atrial fibrillation.  Rates controlled. ? ?4.  Left atrial appendage thrombus.  Patient has been maintained on Coumadin.  Had clot on Eliquis.  INR 2.5 yesterday  ? ?5.  Chronic kidney disease.  Followed by Dr.  Osborne Casco as an outpatient. ?Creatinine 1.93  ? ?  6. CHF:  Change to PO lasix 80 mg bid as she has diuresed well with stable Cr and K Related to afib, amyloid heart disease On Tafamidis PCWP 21 mmHg with V wave and PA pressure 66/28 mmhg  ? ?Will arrange outpatient f/u with Dr Marisue Ivan  ? ?  ?   ?Signed, ?Jenkins Rouge, MD  ?12/28/2021, 9:43 AM   ? ?

## 2021-12-29 ENCOUNTER — Telehealth: Payer: Self-pay

## 2021-12-29 NOTE — Telephone Encounter (Signed)
Patient is out of the hospital and calling to make an appt with Dr. Nori Riis. Please advise ?

## 2021-12-29 NOTE — Telephone Encounter (Signed)
Transition Care Management Follow-up Telephone Call ?Date of discharge and from where: [facility] 12-28-21 Dx: Acute CHF ?How have you been since you were released from the hospital? Doing ok  ?Any questions or concerns? No ? ?Items Reviewed: ?Did the pt receive and understand the discharge instructions provided? Yes  ?Medications obtained and verified? Yes  ?Other? No  ?Any new allergies since your discharge? No  ?Dietary orders reviewed? Yes ?Do you have support at home? Yes  ? ?Home Care and Equipment/Supplies: ?Were home health services ordered? no ?If so, what is the name of the agency? na  ?Has the agency set up a time to come to the patient's home? not applicable ?Were any new equipment or medical supplies ordered?  No ?What is the name of the medical supply agency? na ?Were you able to get the supplies/equipment? not applicable ?Do you have any questions related to the use of the equipment or supplies? No ? ?Functional Questionnaire: (I = Independent and D = Dependent) ?ADLs: I ? ?Bathing/Dressing- I ? ?Meal Prep- I ? ?Eating- I ? ?Maintaining continence- I ? ?Transferring/Ambulation- I ? ?Managing Meds- I ? ?Follow up appointments reviewed: ? ?PCP Hospital f/u appt confirmed? Yes  Scheduled to see Dr [name]  on 01-03-22 @ 2pm ?. ?[facility] f/u appt confirmed? Yes  Scheduled to see vascular on 01-12-22 @ 230pm. ?Are transportation arrangements needed? No  ?If their condition worsens, is the pt aware to call PCP or go to the Emergency Dept.? Yes ?Was the patient provided with contact information for the PCP's office or ED? Yes ?Was to pt encouraged to call back with questions or concerns? Yes  ?

## 2021-12-30 ENCOUNTER — Ambulatory Visit: Payer: Medicare Other

## 2021-12-30 ENCOUNTER — Telehealth: Payer: Self-pay | Admitting: Cardiovascular Disease

## 2021-12-30 DIAGNOSIS — G4733 Obstructive sleep apnea (adult) (pediatric): Secondary | ICD-10-CM | POA: Diagnosis not present

## 2021-12-30 NOTE — Chronic Care Management (AMB) (Signed)
?Chronic Care Management  ? ?CCM RN Visit Note ? ?12/30/2021 ?Name: Regina [name] MRN: [id number] DOB: 1941-01-14 ? ?Subjective: ?Regina [name] is a 81 y.o. year old female who is a primary care patient of [name], Malka So, MD. The care management team was consulted for assistance with disease management and care coordination needs.   ? ?Engaged with patient by telephone for initial visit in response to provider referral for case management and/or care coordination services.  ? ?Consent to Services:  ?The patient was given the following information about Chronic Care Management services today, agreed to services, and gave verbal consent: 1. CCM service includes personalized support from designated clinical staff supervised by the primary care provider, including individualized plan of care and coordination with other care providers 2. 24/7 contact phone numbers for assistance for urgent and routine care needs. 3. Service will only be billed when office clinical staff spend 20 minutes or more in a month to coordinate care. 4. Only one practitioner may furnish and bill the service in a calendar month. 5.The patient may stop CCM services at any time (effective at the end of the month) by phone call to the office staff. 6. The patient will be responsible for cost sharing (co-pay) of up to 20% of the service fee (after annual deductible is met). Patient agreed to services and consent obtained. ? ?Patient agreed to services and verbal consent obtained.  ? ?Assessment: Review of patient past medical history, allergies, medications, health status, including review of consultants reports, laboratory and other test data, was performed as part of comprehensive evaluation and provision of chronic care management services.  ? ?SDOH (Social Determinants of Health) assessments and interventions performed:  ?SDOH Interventions   ? ?Flowsheet Row Most Recent Value  ?SDOH Interventions   ?Food Insecurity Interventions  Intervention Not Indicated  ?Financial Strain Interventions Intervention Not Indicated  ?Housing Interventions Intervention Not Indicated  ?Stress Interventions Intervention Not Indicated  ?Transportation Interventions Intervention Not Indicated  ? ?  ?  ? ?[place] ? ?Allergies  ?Allergen Reactions  ? Ace Inhibitors Other (See Comments)  ?  unknown  ? Amlodipine Other (See Comments)  ?  unknown  ? Atenolol Other (See Comments)  ?  bradycardia  ? Avandia [Rosiglitazone] Other (See Comments)  ?  unknown  ? Darvon [Propoxyphene] Other (See Comments)  ?  unknown  ? Erythromycin Itching  ? Hydralazine Other (See Comments)  ?  Burning in throat and chest  ? Hydrocodone Other (See Comments)  ?  Hallucinations.  ? Levofloxacin Itching  ? Morphine And Related Other (See Comments)  ?  Dizzy and hallucianation, vomiting; ?Willing to try low dose  ? Percocet [Oxycodone-Acetaminophen] Other (See Comments)  ?  hallucination  ? Spironolactone Other (See Comments)  ?  unknown  ? Tramadol Other (See Comments)  ?  Unknown/does not recall reaction but does not want to take again  ? ? ?Outpatient Encounter Medications as of 12/30/2021  ?Medication Sig Note  ? Alcohol Swabs (B-D SINGLE USE SWABS REGULAR) PADS Use to test blood sugar up to 3 times daily   ? allopurinol (ZYLOPRIM) 100 MG tablet TAKE ONE TABLET BY MOUTH EVERY MORNING   ? aspirin EC 81 MG EC tablet Take 1 tablet (81 mg total) by mouth daily. Swallow whole.   ? atorvastatin (LIPITOR) 20 MG tablet TAKE ONE TABLET BY MOUTH EVERYDAY AT BEDTIME   ? Blood Glucose Monitoring Suppl (ACCU-CHEK GUIDE) w/Device KIT 1 Device by Does not  apply route 3 (three) times daily. (Patient not taking: Reported on 12/29/2021)   ? Capsaicin 0.033 % CREA Apply 1 application topically 3 (three) times daily.   ? Cholecalciferol (VITAMIN D3) 50 MCG (2000 UT) capsule Take 2,000 Units by mouth daily.   ? COMFORT EZ PEN NEEDLES 31G X 8 MM MISC USE 3 TO 4 times daily as directed (Patient not taking:  Reported on 12/29/2021)   ? Continuous Blood Gluc Sensor (DEXCOM G6 SENSOR) MISC 1 Device by Does not apply route as directed.   ? Continuous Blood Gluc Transmit (DEXCOM G6 TRANSMITTER) MISC 1 Device by Does not apply route as directed.   ? famotidine (PEPCID) 20 MG tablet Take 1 tablet (20 mg total) by mouth at bedtime.   ? FARXIGA 10 MG TABS tablet Take 1 tablet (10 mg total) by mouth daily.   ? furosemide (LASIX) 80 MG tablet Take 1 tablet (80 mg total) by mouth 2 (two) times daily.   ? gabapentin (NEURONTIN) 600 MG tablet TAKE ONE TABLET BY MOUTH EVERY MORNING and TAKE ONE TABLET BY MOUTH EVERYDAY AT BEDTIME   ? glucose blood (ACCU-CHEK GUIDE) test strip Use to test blood sugar up to 3 times daily (Patient not taking: Reported on 12/29/2021)   ? insulin glargine, 1 Unit Dial, (TOUJEO SOLOSTAR) 300 UNIT/ML Solostar Pen Inject 26 Units into the skin daily. Eat a snack with protein nightly before bedtime.   ? insulin lispro (HUMALOG KWIKPEN) 100 UNIT/ML KwikPen Max daily 15 units (Patient taking differently: 0-8 Units 3 (three) times daily. Max daily 15 units. Sliding Scale: If BS>160=0 units, 161-190=1 unit, 191-220=2 units, 221-250=3 units, 251-280=4 units, 281-310=5 units, 311-340=6 units, 341-370=7 units, 371-400=8 units)   ? Insulin Pen Needle 32G X 4 MM MISC 1 Device by Does not apply route in the morning, at noon, in the evening, and at bedtime.   ? ipratropium (ATROVENT) 0.06 % nasal spray Place 2 sprays into both nostrils 4 (four) times daily.   ? Lancets Misc. (ACCU-CHEK FASTCLIX LANCET) KIT Use to test blood sugar up to 3 times daily   ? levothyroxine (SYNTHROID) 150 MCG tablet TAKE ONE TABLET BY MOUTH BEFORE BREAKFAST   ? magnesium oxide (MAG-OX) 400 MG tablet TAKE ONE TABLET BY MOUTH EVERY MORNING   ? meclizine (ANTIVERT) 25 MG tablet Take 25 mg by mouth daily as needed for dizziness.   ? metoprolol succinate (TOPROL-XL) 25 MG 24 hr tablet Take 1 tablet (25 mg total) by mouth daily. Take with or  immediately following a meal.   ? nitroGLYCERIN (NITROSTAT) 0.4 MG SL tablet Place 1 tablet under tongue for chest pain. Repeat every five minutes 2 more times if needed for a total of 3 tablets within 15 minutes. 12/24/2021: Pt took 3 tablets today  ? Polyethylene Glycol 3350 (MIRALAX PO) Take 1 Package by mouth daily as needed (constipation).   ? Propylene Glycol (SYSTANE BALANCE OP) Place 1 drop into both eyes daily as needed (for dry eyes).   ? SALINE MIST SPRAY NA Place 2 sprays into the nose at bedtime.   ? Tafamidis 61 MG CAPS Take 61 mg by mouth daily.   ? warfarin (COUMADIN) 5 MG tablet TAKE 1/2 TO 1 TABLET BY MOUTH daily OR AS DIRECTED by THE coumadin clinic (Patient taking differently: Take 2.5-5 mg by mouth as directed. Take 1/2 tablet (2.5 mg) Daily Except On Sundays Take 1 tablet (5 mg) or as directed by the Coumadin clinic)   ? ?No facility-administered  encounter medications on file as of 12/30/2021.  ? ? ?Patient Active Problem List  ? Diagnosis Date Noted  ? Chest pain 12/26/2021  ? Left atrial thrombus 12/25/2021  ? Wild-type transthyretin-related (ATTR) amyloidosis ([place])   ? Lung nodules 12/13/2021  ? Polymyalgia rheumatica ([place]) 06/30/2021  ? Neck pain 06/18/2021  ? Gait abnormality 06/18/2021  ? Memory loss 06/18/2021  ? Atherosclerosis of aorta ([place]) 06/09/2021  ? Type 2 diabetes mellitus with stage 3a chronic kidney disease, with long-term current use of insulin ([place]) 09/14/2020  ? Type 2 diabetes mellitus with retinopathy, with long-term current use of insulin ([place]) 09/14/2020  ? Diabetes mellitus ([place]) 09/14/2020  ? Type 2 diabetes mellitus with diabetic polyneuropathy, with long-term current use of insulin ([place]) 09/14/2020  ? Long term (current) use of anticoagulants 07/13/2020  ? Persistent atrial fibrillation ([place])   ? Mixed hyperlipidemia   ? Acute on chronic diastolic CHF (congestive heart failure), NYHA class 3 ([place])   ? Demand ischemia ([place])   ? Atrial fibrillation ([place]) 04/24/2020  ?  Other fatigue 03/24/2020  ? Short of breath on exertion 03/24/2020  ? Congestive heart failure ([place]) 03/24/2020  ? Vitamin D deficiency 03/24/2020  ? Sleep apnea 05/21/2019  ? History of hepatitis C 05/21/2019  ? Insomn

## 2021-12-30 NOTE — Patient Instructions (Addendum)
Visit Information  ? ?Thank you for taking time to visit with me today. Please don't hesitate to contact me if I can be of assistance to you before our next scheduled telephone appointment. ?I am sending you the [place]  calendar to log your readings ? ?Following are the goals we discussed today:  ?Take all medications as prescribed ?Attend all scheduled provider appointments ?Call pharmacy for medication refills 3-7 days in advance of running out of medications ?Call provider office for new concerns or questions  ?call office if I gain more than 2 pounds in one day or 5 pounds in one week ?keep legs up while sitting ?track weight in diary ?weigh myself daily ?follow rescue plan if symptoms flare-up ?eat more whole grains, fruits and vegetables, lean meats and healthy fats ?keep appointment with eye doctor ?check blood sugar at prescribed times: three times daily and when you have symptoms of low or high blood sugar ?fill half of plate with vegetables ?manage portion size ?Try eating protein with snacks ?check pulse (heart) rate once a day ?make a plan to eat healthy ?keep all lab appointments ?take medicine as prescribed ?check blood pressure daily ?choose a place to take my blood pressure (home, clinic or office, retail store) ?keep a blood pressure log ?take blood pressure log to all doctor appointments ?call for medicine refill 2 or 3 days before it runs out ?take all medications exactly as prescribed ?call doctor with any symptoms you believe are related to your medicine ?Heart Failure Action Plan ?A heart failure action plan helps you understand what to do when you have symptoms of heart failure. Your action plan is a color-coded plan that lists the symptoms to watch for and indicates what actions to take. ?If you have symptoms in the red zone, you need medical care right away. ?If you have symptoms in the yellow zone, you are having problems. ?If you have symptoms in the green zone, you are  doing well. ?Follow the plan that was created by you and your health care provider. Review your plan each time you visit your health care provider. ?Red zone ?These signs and symptoms mean you should get medical help right away: ?You have trouble breathing when resting. ?You have a dry cough that is getting worse. ?You have swelling or pain in your legs or abdomen that is getting worse. ?You suddenly gain more than 2-3 lb (0.9-1.4 kg) in 24 hours, or more than 5 lb (2.3 kg) in a week. This amount may be more or less depending on your condition. ?You have trouble staying awake or you feel confused. ?You have chest pain. ?You do not have an appetite. ?You pass out. ?You have worsening sadness or depression. ?If you have any of these symptoms, call your local emergency services (911 in the [place] right away. Do not drive yourself to the hospital. ?Yellow zone ?These signs and symptoms mean your condition may be getting worse and you should make some changes: ?You have trouble breathing when you are active, or you need to sleep with your head raised on extra pillows to help you breathe. ?You have swelling in your legs or abdomen. ?You gain 2-3 lb (0.9-1.4 kg) in 24 hours, or 5 lb (2.3 kg) in a week. This amount may be more or less depending on your condition. ?You get tired easily. ?You have trouble sleeping. ?You have a dry cough. ?If you have any of these symptoms: ?Contact your health care provider within the next  day. ?Your health care provider may adjust your medicines. ?Green zone ?These signs mean you are doing well and can continue what you are doing: ?You do not have shortness of breath. ?You have very little swelling or no new swelling. ?Your weight is stable (no gain or loss). ?You have a normal activity level. ?You do not have chest pain or any other new symptoms. ?Follow these instructions at home: ?Take over-the-counter and prescription medicines only as told by your health care provider. ?Weigh yourself  daily. Your target weight is __________ lb (__________ kg). ?Call your health care provider if you gain more than __________ lb (__________ kg) in 24 hours, or more than __________ lb (__________ kg) in a week. ?Health care provider name: _____________________________________________________ ?Health care provider phone number: _____________________________________________________ ?Eat a heart-healthy diet. Work with a diet and nutrition specialist (dietitian) to create an eating plan that is best for you. ?Keep all follow-up visits. This is important. ?Where to find more information ?American Heart Association: www.heart.org ?Summary ?A heart failure action plan helps you understand what to do when you have symptoms of heart failure. ?Follow the action plan that was created by you and your health care provider. ?Get help right away if you have any symptoms in the red zone. ?This information is not intended to replace advice given to you by your health care provider. Make sure you discuss any questions you have with your health care provider. ?Document Revised: 05/18/2020 Document Reviewed: 05/18/2020 ?Elsevier Patient Education ? [place]. ? ?Our next appointment is by telephone on 02/04/22 at 10:45 AM ? ?Please call the care guide team at [phone] if you need to cancel or reschedule your appointment.  ? ?If you are experiencing a Mental Health or [place] or need someone to talk to, please call the Suicide and Crisis Lifeline: 988 ?call the [place] National Suicide Prevention Lifeline: 857 638 4880 or TTY: (954)407-1576 TTY 615-819-6590) to talk to a trained counselor ?call 1-800-273-TALK (toll free, 24 hour hotline) ?go to [facility] [address] [place] [phone]) ?call 911  ? ?Following is a copy of your full care plan:  ?Care Plan : [place] [facility]  ?Updates made by Dimitri Ped, RN since 12/30/2021 12:00 AM  ?   ? ?Problem: Chronic Disease Management and Care Coordination Needs (CHF,CAD,DM2 HTN,HLD, Atrial Fib, CKD4)   ?Priority: High  ?  ? ?Long-Range Goal: Establish Plan of Care for Chronic Disease Management Needs (CHF,CAD,DM2 HTN,HLD, Atrial Fib, CKD4)   ?Start Date: 12/30/2021  ?Expected End Date: 12/30/2022  ?Priority: High  ?Note:   ?Current Barriers:  ?Knowledge Deficits related to plan of care for management of Atrial Fibrillation, CHF, CAD, HTN, HLD, DMII, and CKD Stage 4  ?Chronic Disease Management support and education needs related to Atrial Fibrillation, CHF, CAD, HTN, HLD, DMII, and CKD Stage 4  ?States she is still feeling weak since getting out of the hospital.  States she is weighting every day and her weight was 220 today.  Denies any swelling, chest pains or shortness of breath.  States her B/P reading have been good around 110-120/80.  State her blood sugars range from 115-200 and she is taking her insulin as directed.  Denies any hypoglycemia.  States she tries to follow a low sodium diabetes diet.  ? ?RNCM Clinical Goal(s):  ?Patient will verbalize understanding of plan for management of Atrial Fibrillation, CHF, CAD, HTN, HLD, DMII, and CKD Stage 4 as evidenced  by voiced adherence to plan of care ?verbalize basic understanding of  Atrial Fibrillation, CHF, CAD, HTN, HLD, and DMII disease process and self health management plan as evidenced by voiced understanding and teach back ?take all medications exactly as prescribed and will call provider for medication related questions as evidenced by dispense report and pt verbalization  ?attend all scheduled medical appointments: Dr. [name] 01/03/22, cardiology 01/10/22, CCM Pharm D 01/05/22, Neurology 01/18/22 as evidenced by medical records ?demonstrate Improved adherence to prescribed treatment plan for Atrial Fibrillation, CHF, CAD, HTN, HLD, DMII, and CKD Stage 4 as evidenced by readings within limits, voiced adherence to plan of care ?continue to work with  RN Care Manager to address care management and care coordination needs related to  Atrial Fibrillation, CHF, CAD, HTN, HLD, DMII, and CKD Stage 4 as evidenced by adherence to CM Team Scheduled appointments throug

## 2021-12-30 NOTE — Telephone Encounter (Signed)
TOC scheduled for 01/10/22 at 10:05am with Coletta Memos, NP per Dr. Johnsie Cancel ?

## 2021-12-30 NOTE — Telephone Encounter (Signed)
TOC: ?Patient contacted regarding discharge from [facility] on 12/28/2021. ? ?Patient understands to follow up with provider Coletta Memos. FNP on 01/10/25 at 10:05 at [facility]. ?Patient understands discharge instructions? Yes. ?Patient understands medications and regiment? Yes. ?Patient understands to bring all medications to this visit? Yes. ?Patient has appointment with coumadin clinic on 3/21 at 9:15: she stated she knew. ?Ask patient:  Are you enrolled in My Chart Yes ? ?Patient stated her right wrist is not swollen, red, hot, throbbing, or bleeding. Informed her to contact us if any of theses sings are present. She verbalized understanding. ?Patient reported she feels "stumbly" and has used a cane for years. She wanted to know if it was heart-related. ?I told her to ask that and any other questions at her appointment with J. Cleaver. ? ?

## 2021-12-31 NOTE — Telephone Encounter (Signed)
Called patient, advised that we could have her see Dr.O'Neal on 03/21. Patient verbalized understanding, cancelled upcoming appointment with NP on 03/27-  ? ?Will route to MD as FYI.  ?Thanks!  ? ?

## 2022-01-02 NOTE — Progress Notes (Signed)
?Cardiology Office Note:   ?Date:  01/04/2022  ?NAME:  Regina [name]    ?MRN: [id number] ?DOB:  [birth year]  ? ?PCP:  [name], [name], MD  ?Cardiologist:  Evalina Field, MD  ?Electrophysiologist:  None  ? ?Referring MD: [name], [name], MD  ? ?Chief Complaint  ?Patient presents with  ? Follow-up  ?   ?  ? ?History of Present Illness:   ?Regina [name] is a 81 y.o. female with a hx of cardiac amyloidosis, HTN, HLD, DM, CAD, CKD4 who presents for follow-up. Admitted 12/24/2021-12/28/2021 for ADHF. RHC performed that showed elevated filling pressures.  She seems to doing better from a heart failure standpoint.  Unfortunately she was in the emergency room this morning.  She suffered from a fall while using the bathroom.  She denies any dizziness or lightheadedness.  She had no shortness of breath or chest pain prior to the episode.  No rapid heartbeat sensation.  Does not appear to be cardiac related.  Her blood pressure is stable.  She is really had no symptoms of hypotension.  CT head was negative.  No bleeding.  She is on Coumadin.  Does describe some soreness in her face and neck which I suspect is fall related.  On further discussion she is having more frequent falls.  She is not that active.  I suspect deconditioning is a big issue here.  She is euvolemic on exam.  We discussed going back to torsemide.  I believe this is a better drug for her.  She overall denies any major symptoms in office.  Appears euvolemic.  Seems to be doing quite well.  We also discussed amvuttra.  I think she would benefit from this drug immensely.  This is a drug meant for neuropathy symptoms.  She is continue to complain of abdominal fullness.  I believe this is likely related to her hereditary amyloidosis. ? 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 Persistent  Afib ?-LAA slugde vs thrombus 05/05/2020  ?-persistent sludge 07/01/2020 ?-LAA thrombus persistent 08/05/2020 ?-on warfarin ?3. DM ?-A1c 7.6 ?4. HLD ?-T chol 115, HDL 54, LDL 40, triglycerides 101 ?5. HTN ?6. Non-obstructive CAD ?-LHC 01/19/2015 [place] ?-50% mid RCA and 50% LAD ?-MPI 08/14/2020 -> normal ?7. OSA ?8. CKD 3/4 ?9. CVA ?-11/2020 @ [facility]  ?10. Polymyalgia Rheumatica  ? ?Past Medical History: ?Past Medical History:  ?Diagnosis Date  ? Back pain   ? CHF (congestive heart failure) ([place])   ? 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 Chronic combined systolic and diastolic CHF (congestive heart failure) ([place])   ? Diabetes mellitus without complication ([place])   ? GERD (gastroesophageal reflux disease)   ? Heart disease   ? Hypertension   ? Hypothyroidism   ? Joint pain   ? Mini stroke   ? Non-obstructive CAD   ? a. 01/2015 Cardiolite: + inf wall ischemia, EF 56%;  b. 01/2015 Cath: LM nl, LAD 65m LCX min irregs, RCA dominant, 50-6100m ? Sleep apnea   ? Swallowing difficulty   ? ? ?Past Surgical History: ?Past Surgical History:  ?Procedure Laterality Date  ? ABDOMINAL HYSTERECTOMY    ? BUBBLE STUDY  07/01/2020  ? Procedure: BUBBLE STUDY;  Surgeon: AcElouise MunroeMD;  [facility] Service: Cardiovascular;;  ? BUBBLE  STUDY  08/05/2020  ? Procedure: BUBBLE STUDY;  Surgeon: Geralynn Rile, MD;  Location: [place];  Service: Cardiovascular;;  done with definity   ? CARDIAC CATHETERIZATION    ? ESOPHAGOGASTRODUODENOSCOPY    ? a. 01/2015 EGD: patent esophagus.  ? ESOPHAGOGASTRODUODENOSCOPY N/A 01/20/2015  ? Procedure: ESOPHAGOGASTRODUODENOSCOPY (EGD);  Surgeon: Carol Ada, MD;  Location: [facility] ENDOSCOPY;  Service: Endoscopy;  Laterality: N/A;  ? HAND SURGERY    ? JOINT REPLACEMENT    ? reports history bilateral TKA and right TSA  ? LEFT HEART CATHETERIZATION WITH CORONARY ANGIOGRAM N/A 01/19/2015  ? RIGHT HEART CATH N/A 12/27/2021  ? Procedure: RIGHT HEART CATH;  Surgeon: Lorretta Harp, MD;   Location: [place] CV LAB;  Service: Cardiovascular;  Laterality: N/A;  ? TEE WITHOUT CARDIOVERSION  05/04/2020  ? TEE WITHOUT CARDIOVERSION N/A 05/05/2020  ? Procedure: TRANSESOPHAGEAL ECHOCARDIOGRAM (TEE);  Surgeon: Pixie Casino, MD;  Location: [facility] ENDOSCOPY;  Service: Cardiovascular;  Laterality: N/A;  ? TEE WITHOUT CARDIOVERSION N/A 07/01/2020  ? Procedure: TRANSESOPHAGEAL ECHOCARDIOGRAM (TEE);  Surgeon: Elouise Munroe, MD;  Location: [place];  Service: Cardiovascular;  Laterality: N/A;  ? TEE WITHOUT CARDIOVERSION N/A 08/05/2020  ? Procedure: TRANSESOPHAGEAL ECHOCARDIOGRAM (TEE);  Surgeon: Geralynn Rile, MD;  Location: [place];  Service: Cardiovascular;  Laterality: N/A;  ? THYROIDECTOMY    ? ? ?Current Medications: ?Current Meds  ?Medication Sig  ? acetaminophen (TYLENOL) 500 MG tablet Take 1,000 mg by mouth every 6 (six) hours as needed for mild pain.  ? Alcohol Swabs (B-D SINGLE USE SWABS REGULAR) PADS Use to test blood sugar up to 3 times daily  ? allopurinol (ZYLOPRIM) 100 MG tablet TAKE ONE TABLET BY MOUTH EVERY MORNING (Patient taking differently: Take 100 mg by mouth daily.)  ? aspirin EC 81 MG EC tablet Take 1 tablet (81 mg total) by mouth daily. Swallow whole.  ? atorvastatin (LIPITOR) 20 MG tablet TAKE ONE TABLET BY MOUTH EVERYDAY AT BEDTIME (Patient taking differently: Take 20 mg by mouth at bedtime.)  ? Blood Glucose Monitoring Suppl (ACCU-CHEK GUIDE) w/Device KIT 1 Device by Does not apply route 3 (three) times daily.  ? Capsaicin 0.033 % CREA Apply 1 application topically 3 (three) times daily.  ? Cholecalciferol (VITAMIN D3) 50 MCG (2000 UT) capsule Take 2,000 Units by mouth daily.  ? COMFORT EZ PEN NEEDLES 31G X 8 MM MISC USE 3 TO 4 times daily as directed  ? Continuous Blood Gluc Sensor (DEXCOM G6 SENSOR) MISC 1 Device by Does not apply route as directed.  ? Continuous Blood Gluc Transmit (DEXCOM G6 TRANSMITTER) MISC 1 Device by Does not apply route as directed.  ?  famotidine (PEPCID) 20 MG tablet Take 1 tablet (20 mg total) by mouth at bedtime.  ? FARXIGA 10 MG TABS tablet Take 1 tablet (10 mg total) by mouth daily.  ? gabapentin (NEURONTIN) 600 MG tablet TAKE ONE TABLET BY MOUTH EVERY MORNING and TAKE ONE TABLET BY MOUTH EVERYDAY AT BEDTIME (Patient taking differently: Take 600 mg by mouth 2 (two) times daily.)  ? glucose blood (ACCU-CHEK GUIDE) test strip Use to test blood sugar up to 3 times daily  ? insulin glargine, 1 Unit Dial, (TOUJEO SOLOSTAR) 300 UNIT/ML Solostar Pen Inject 26 Units into the skin daily. Eat a snack with protein nightly before bedtime.  ? insulin lispro (HUMALOG KWIKPEN) 100 UNIT/ML KwikPen Max daily 15 units (Patient taking differently: 0-8 Units 3 (three) times daily. Max daily 15 units. Sliding Scale:  If BS>160=0 units, 161-190=1 unit, 191-220=2 units, 221-250=3 units, 251-280=4 units, 281-310=5 units, 311-340=6 units, 341-370=7 units, 371-400=8 units)  ? Insulin Pen Needle 32G X 4 MM MISC 1 Device by Does not apply route in the morning, at noon, in the evening, and at bedtime.  ? ipratropium (ATROVENT) 0.06 % nasal spray Place 2 sprays into both nostrils 4 (four) times daily.  ? Lancets Misc. (ACCU-CHEK FASTCLIX LANCET) KIT Use to test blood sugar up to 3 times daily  ? levothyroxine (SYNTHROID) 150 MCG tablet TAKE ONE TABLET BY MOUTH BEFORE BREAKFAST (Patient taking differently: Take 150 mcg by mouth daily before breakfast.)  ? magnesium oxide (MAG-OX) 400 MG tablet TAKE ONE TABLET BY MOUTH EVERY MORNING (Patient taking differently: Take 400 mg by mouth in the morning.)  ? meclizine (ANTIVERT) 25 MG tablet Take 25 mg by mouth daily as needed for dizziness.  ? metoprolol succinate (TOPROL-XL) 25 MG 24 hr tablet Take 1 tablet (25 mg total) by mouth daily. Take with or immediately following a meal.  ? nitroGLYCERIN (NITROSTAT) 0.4 MG SL tablet Place 1 tablet under tongue for chest pain. Repeat every five minutes 2 more times if needed for a total of  3 tablets within 15 minutes.  ? Polyethylene Glycol 3350 (MIRALAX PO) Take 1 Package by mouth daily as needed (constipation).  ? potassium chloride SA (KLOR-CON M20) 20 MEQ tablet Take 1 tablet (20 mEq total

## 2022-01-03 ENCOUNTER — Ambulatory Visit: Payer: Medicare Other | Admitting: Family Medicine

## 2022-01-03 NOTE — Progress Notes (Signed)
?HPI: ?Regina [name] is a 81 y.o. female with hx of GERD,hypothyroidism,DM II, amyloidosis,OSA,CKD, atrial fib on chronic anticoagulation,CAD, and HTN here today with her daughter and granddaughter to follow on recent hospitalization. ?She was hospitalized from 12/24/2021 to 12/28/2021. ?[facility] call on 12/29/21. ? ?She presented to the ED with 1 week of chest pain that improved with sublingual nitrogen. ?During initial evaluation troponin was mildly elevated. ?Cardiologist was consulted during hospitalization. ?She underwent IV diuresis and had right-sided cardiac catheterization on 12/27/2021, which show moderate RCA lesion and LAD lesion.  LVEF 40 to 45% with global hypokinesis as well as elevated pulmonary artery pressure. ?Torsemide was discontinued and  discharged on Lasix 80 mg twice daily started.She saw her cardiologist today and changed back to torsemide 20 mg 2 tabs daily. ? ?HypoK+: Planning on resuming KLOR 20 meq daily today, she did not receive Rx after hospital discharge, so ran out. ?Hypomg++: She is on Mg Oxide 400 mg daily. ?Mg was 2.4 12/28/21. ? ?She has not had exertional CP,SOB,palpitations,orthopnea,or PND. ?She has had some chest discomfort mid upper chest, "a little something";which she thinks is "indigestion." ?+ Heartburn. ?Exacerbated by certain foods. She had a milkshake earlier today. ?She is on Famotidine. ? ?CKD III: Following with nephrologist. ? ?Lab Results  ?Component Value Date  ? CREATININE 2.19 (H) 01/04/2022  ? BUN 50 (H) 01/04/2022  ? NA 136 01/04/2022  ? K 3.1 (L) 01/04/2022  ? CL 97 (L) 01/04/2022  ? CO2 26 01/04/2022  ? ?DM II: She cancelled last appt with her endocrinologist, planning on rescheduling. C/O elevated BS's every time she eats something. ? ?Lab Results  ?Component Value Date  ? HGBA1C 7.6 (A) 08/13/2021  ? ?She is not exercising regularly. ?Planning on starting PT today, also looking into starting pool exercises. ?She has a lot of dietary restrictions, she is  trying to follow a healthful diet. ? ?Fell in the bathroom earlier today, was evaluated in the ED. She is not sure what happen. She was getting up from toilet, pulling pants up,lost balance and fell.  She did not have her cane with her.Event was not precede or followed by symptoms. ?Having neck pain and upper lip edema. ? ?Cervical spine and head CT were done today, negative for acute intracranial or cervical spine injury. ? ?Review of Systems  ?Constitutional:  Positive for activity change and fatigue. Negative for appetite change and fever.  ?HENT:  Negative for sore throat and trouble swallowing.   ?Eyes:  Negative for redness and visual disturbance.  ?Respiratory:  Negative for cough and wheezing.   ?Gastrointestinal:  Negative for abdominal pain, nausea and vomiting.  ?     Negative for changes in bowel habits.  ?Genitourinary:  Negative for decreased urine volume, dysuria and hematuria.  ?Musculoskeletal:  Positive for arthralgias and gait problem.  ?Skin:  Negative for rash.  ?Neurological:  Negative for syncope and facial asymmetry.  ?Psychiatric/Behavioral:  Negative for confusion.   ?Rest see pertinent positives and negatives per HPI. ? ?Current Outpatient Medications on File Prior to Visit  ?Medication Sig Dispense Refill  ? Alcohol Swabs (B-D SINGLE USE SWABS REGULAR) PADS Use to test blood sugar up to 3 times daily 100 each 3  ? allopurinol (ZYLOPRIM) 100 MG tablet TAKE ONE TABLET BY MOUTH EVERY MORNING (Patient taking differently: Take 100 mg by mouth daily.) 90 tablet 1  ? aspirin EC 81 MG EC tablet Take 1 tablet (81 mg total) by mouth daily. Swallow whole. 30 tablet  11  ? atorvastatin (LIPITOR) 20 MG tablet TAKE ONE TABLET BY MOUTH EVERYDAY AT BEDTIME (Patient taking differently: Take 20 mg by mouth at bedtime.) 90 tablet 2  ? Blood Glucose Monitoring Suppl (ACCU-CHEK GUIDE) w/Device KIT 1 Device by Does not apply route 3 (three) times daily. 1 kit 0  ? Capsaicin 0.033 % CREA Apply 1 application  topically 3 (three) times daily. 56.6 g 11  ? Cholecalciferol (VITAMIN D3) 50 MCG (2000 UT) capsule Take 2,000 Units by mouth daily.    ? COMFORT EZ PEN NEEDLES 31G X 8 MM MISC USE 3 TO 4 times daily as directed 100 each 2  ? Continuous Blood Gluc Sensor (DEXCOM G6 SENSOR) MISC 1 Device by Does not apply route as directed. 9 each 3  ? Continuous Blood Gluc Transmit (DEXCOM G6 TRANSMITTER) MISC 1 Device by Does not apply route as directed. 1 each 3  ? famotidine (PEPCID) 20 MG tablet Take 1 tablet (20 mg total) by mouth at bedtime. 90 tablet 1  ? FARXIGA 10 MG TABS tablet Take 1 tablet (10 mg total) by mouth daily. 90 tablet 3  ? gabapentin (NEURONTIN) 600 MG tablet TAKE ONE TABLET BY MOUTH EVERY MORNING and TAKE ONE TABLET BY MOUTH EVERYDAY AT BEDTIME (Patient taking differently: Take 600 mg by mouth 2 (two) times daily.) 60 tablet 3  ? glucose blood (ACCU-CHEK GUIDE) test strip Use to test blood sugar up to 3 times daily 100 each 3  ? insulin glargine, 1 Unit Dial, (TOUJEO SOLOSTAR) 300 UNIT/ML Solostar Pen Inject 26 Units into the skin daily. Eat a snack with protein nightly before bedtime. 15 mL 6  ? insulin lispro (HUMALOG KWIKPEN) 100 UNIT/ML KwikPen Max daily 15 units (Patient taking differently: 0-8 Units 3 (three) times daily. Max daily 15 units. Sliding Scale: If BS>160=0 units, 161-190=1 unit, 191-220=2 units, 221-250=3 units, 251-280=4 units, 281-310=5 units, 311-340=6 units, 341-370=7 units, 371-400=8 units) 15 mL 11  ? Insulin Pen Needle 32G X 4 MM MISC 1 Device by Does not apply route in the morning, at noon, in the evening, and at bedtime. 150 each 6  ? ipratropium (ATROVENT) 0.06 % nasal spray Place 2 sprays into both nostrils 4 (four) times daily. 15 mL 3  ? Lancets Misc. (ACCU-CHEK FASTCLIX LANCET) KIT Use to test blood sugar up to 3 times daily 1 kit 1  ? levothyroxine (SYNTHROID) 150 MCG tablet TAKE ONE TABLET BY MOUTH BEFORE BREAKFAST (Patient taking differently: Take 150 mcg by mouth daily  before breakfast.) 90 tablet 1  ? magnesium oxide (MAG-OX) 400 MG tablet TAKE ONE TABLET BY MOUTH EVERY MORNING (Patient taking differently: Take 400 mg by mouth in the morning.) 90 tablet 1  ? meclizine (ANTIVERT) 25 MG tablet Take 25 mg by mouth daily as needed for dizziness.    ? metoprolol succinate (TOPROL-XL) 25 MG 24 hr tablet Take 1 tablet (25 mg total) by mouth daily. Take with or immediately following a meal. 90 tablet 3  ? nitroGLYCERIN (NITROSTAT) 0.4 MG SL tablet Place 1 tablet under tongue for chest pain. Repeat every five minutes 2 more times if needed for a total of 3 tablets within 15 minutes. 25 tablet 2  ? Polyethylene Glycol 3350 (MIRALAX PO) Take 1 Package by mouth daily as needed (constipation).    ? potassium chloride SA (KLOR-CON M20) 20 MEQ tablet Take 1 tablet (20 mEq total) by mouth daily. 90 tablet 3  ? Propylene Glycol (SYSTANE BALANCE OP) Place 1 drop  into both eyes daily as needed (for dry eyes).    ? SALINE MIST SPRAY NA Place 2 sprays into the nose at bedtime.    ? Tafamidis 61 MG CAPS Take 61 mg by mouth daily. 30 capsule 12  ? torsemide (DEMADEX) 20 MG tablet Take 2 tablets (40 mg total) by mouth daily. 180 tablet 3  ? warfarin (COUMADIN) 5 MG tablet TAKE 1/2 TO 1 TABLET BY MOUTH daily OR AS DIRECTED by THE coumadin clinic (Patient taking differently: Take 2.5-5 mg by mouth as directed. Take 1/2 tablet (2.5 mg) Daily Except On Sundays Take 1 tablet (5 mg) or as directed by the Coumadin clinic) 30 tablet 0  ? ?No current facility-administered medications on file prior to visit.  ? ? ?Past Medical History:  ?Diagnosis Date  ? Back pain   ? CHF (congestive heart failure) ([place])   ? 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 Chronic combined systolic and diastolic CHF (congestive heart failure) ([place])   ? Diabetes mellitus without complication ([place])   ? GERD (gastroesophageal reflux disease)   ? Heart disease   ? Hypertension   ? Hypothyroidism   ? Joint pain   ? Mini stroke   ?  Non-obstructive CAD   ? a. 01/2015 Cardiolite: + inf wall ischemia, EF 56%;  b. 01/2015 Cath: LM nl, LAD 8m LCX min irregs, RCA dominant, 50-661m ? Sleep apnea   ? Swallowing difficulty   ? ?Allergies  ?Aller

## 2022-01-04 ENCOUNTER — Ambulatory Visit (INDEPENDENT_AMBULATORY_CARE_PROVIDER_SITE_OTHER): Payer: Medicare Other | Admitting: Family Medicine

## 2022-01-04 ENCOUNTER — Encounter: Payer: Self-pay | Admitting: Cardiovascular Disease

## 2022-01-04 ENCOUNTER — Emergency Department (HOSPITAL_COMMUNITY): Payer: Medicare Other

## 2022-01-04 ENCOUNTER — Ambulatory Visit (INDEPENDENT_AMBULATORY_CARE_PROVIDER_SITE_OTHER): Payer: Medicare Other | Admitting: Cardiovascular Disease

## 2022-01-04 ENCOUNTER — Other Ambulatory Visit: Payer: Self-pay

## 2022-01-04 ENCOUNTER — Ambulatory Visit: Payer: Medicare Other | Admitting: Cardiovascular Disease

## 2022-01-04 ENCOUNTER — Encounter: Payer: Self-pay | Admitting: Family Medicine

## 2022-01-04 ENCOUNTER — Encounter (HOSPITAL_COMMUNITY): Payer: Self-pay

## 2022-01-04 ENCOUNTER — Emergency Department (HOSPITAL_COMMUNITY)
Admission: EM | Admit: 2022-01-04 | Discharge: 2022-01-04 | Disposition: A | Discharge status: Home / Self Care | Payer: Medicare Other | Attending: Emergency Medicine | Admitting: Emergency Medicine

## 2022-01-04 ENCOUNTER — Telehealth: Payer: Self-pay | Admitting: Pharmacist

## 2022-01-04 VITALS — BP 128/80 | HR 87 | Resp 16 | Ht 62.0 in | Wt 224.0 lb

## 2022-01-04 VITALS — BP 110/68 | HR 89 | Ht 62.0 in | Wt 224.2 lb

## 2022-01-04 DIAGNOSIS — Z743 Need for continuous supervision: Secondary | ICD-10-CM | POA: Diagnosis not present

## 2022-01-04 DIAGNOSIS — S0990XA Unspecified injury of head, initial encounter: Secondary | ICD-10-CM | POA: Diagnosis not present

## 2022-01-04 DIAGNOSIS — E113499 Type 2 diabetes mellitus with severe nonproliferative diabetic retinopathy without macular edema, unspecified eye: Secondary | ICD-10-CM

## 2022-01-04 DIAGNOSIS — I4891 Unspecified atrial fibrillation: Secondary | ICD-10-CM

## 2022-01-04 DIAGNOSIS — I10 Essential (primary) hypertension: Secondary | ICD-10-CM

## 2022-01-04 DIAGNOSIS — I4819 Other persistent atrial fibrillation: Secondary | ICD-10-CM

## 2022-01-04 DIAGNOSIS — I513 Intracardiac thrombosis, not elsewhere classified: Secondary | ICD-10-CM

## 2022-01-04 DIAGNOSIS — W19XXXA Unspecified fall, initial encounter: Secondary | ICD-10-CM | POA: Diagnosis not present

## 2022-01-04 DIAGNOSIS — Z79899 Other long term (current) drug therapy: Secondary | ICD-10-CM | POA: Insufficient documentation

## 2022-01-04 DIAGNOSIS — I25118 Atherosclerotic heart disease of native coronary artery with other forms of angina pectoris: Secondary | ICD-10-CM

## 2022-01-04 DIAGNOSIS — S060X0A Concussion without loss of consciousness, initial encounter: Secondary | ICD-10-CM | POA: Diagnosis not present

## 2022-01-04 DIAGNOSIS — Z7982 Long term (current) use of aspirin: Secondary | ICD-10-CM | POA: Insufficient documentation

## 2022-01-04 DIAGNOSIS — Z794 Long term (current) use of insulin: Secondary | ICD-10-CM | POA: Diagnosis not present

## 2022-01-04 DIAGNOSIS — I43 Cardiomyopathy in diseases classified elsewhere: Secondary | ICD-10-CM | POA: Diagnosis not present

## 2022-01-04 DIAGNOSIS — I5022 Chronic systolic (congestive) heart failure: Secondary | ICD-10-CM | POA: Diagnosis not present

## 2022-01-04 DIAGNOSIS — W01198A Fall on same level from slipping, tripping and stumbling with subsequent striking against other object, initial encounter: Secondary | ICD-10-CM | POA: Insufficient documentation

## 2022-01-04 DIAGNOSIS — Y92009 Unspecified place in unspecified non-institutional (private) residence as the place of occurrence of the external cause: Secondary | ICD-10-CM

## 2022-01-04 DIAGNOSIS — S060XAA Concussion with loss of consciousness status unknown, initial encounter: Secondary | ICD-10-CM | POA: Insufficient documentation

## 2022-01-04 DIAGNOSIS — D6869 Other thrombophilia: Secondary | ICD-10-CM | POA: Diagnosis not present

## 2022-01-04 DIAGNOSIS — R079 Chest pain, unspecified: Secondary | ICD-10-CM | POA: Diagnosis not present

## 2022-01-04 DIAGNOSIS — E854 Organ-limited amyloidosis: Secondary | ICD-10-CM

## 2022-01-04 DIAGNOSIS — R6889 Other general symptoms and signs: Secondary | ICD-10-CM | POA: Diagnosis not present

## 2022-01-04 DIAGNOSIS — E876 Hypokalemia: Secondary | ICD-10-CM

## 2022-01-04 DIAGNOSIS — K219 Gastro-esophageal reflux disease without esophagitis: Secondary | ICD-10-CM

## 2022-01-04 DIAGNOSIS — Z7901 Long term (current) use of anticoagulants: Secondary | ICD-10-CM | POA: Diagnosis not present

## 2022-01-04 DIAGNOSIS — G4489 Other headache syndrome: Secondary | ICD-10-CM | POA: Diagnosis not present

## 2022-01-04 DIAGNOSIS — W19XXXD Unspecified fall, subsequent encounter: Secondary | ICD-10-CM | POA: Diagnosis not present

## 2022-01-04 DIAGNOSIS — S199XXA Unspecified injury of neck, initial encounter: Secondary | ICD-10-CM | POA: Diagnosis not present

## 2022-01-04 DIAGNOSIS — R296 Repeated falls: Secondary | ICD-10-CM | POA: Diagnosis not present

## 2022-01-04 DIAGNOSIS — Y92002 Bathroom of unspecified non-institutional (private) residence single-family (private) house as the place of occurrence of the external cause: Secondary | ICD-10-CM | POA: Diagnosis not present

## 2022-01-04 DIAGNOSIS — Z981 Arthrodesis status: Secondary | ICD-10-CM | POA: Diagnosis not present

## 2022-01-04 LAB — CBC WITH DIFFERENTIAL/PLATELET
Abs Immature Granulocytes: 0.07 10*3/uL (ref 0.00–0.07)
Basophils Absolute: 0 10*3/uL (ref 0.0–0.1)
Basophils Relative: 0 %
Eosinophils Absolute: 0.1 10*3/uL (ref 0.0–0.5)
Eosinophils Relative: 1 %
HCT: 41 % (ref 36.0–46.0)
Hemoglobin: 13.3 g/dL (ref 12.0–15.0)
Immature Granulocytes: 1 %
Lymphocytes Relative: 25 %
Lymphs Abs: 1.7 10*3/uL (ref 0.7–4.0)
MCH: 29.4 pg (ref 26.0–34.0)
MCHC: 32.4 g/dL (ref 30.0–36.0)
MCV: 90.5 fL (ref 80.0–100.0)
Monocytes Absolute: 0.8 10*3/uL (ref 0.1–1.0)
Monocytes Relative: 11 %
Neutro Abs: 4.2 10*3/uL (ref 1.7–7.7)
Neutrophils Relative %: 62 %
Platelets: 290 10*3/uL (ref 150–400)
RBC: 4.53 MIL/uL (ref 3.87–5.11)
RDW: 14.4 % (ref 11.5–15.5)
WBC: 6.8 10*3/uL (ref 4.0–10.5)
nRBC: 0 % (ref 0.0–0.2)

## 2022-01-04 LAB — BASIC METABOLIC PANEL
Anion gap: 13 (ref 5–15)
BUN: 50 mg/dL — ABNORMAL HIGH (ref 8–23)
CO2: 26 mmol/L (ref 22–32)
Calcium: 9.2 mg/dL (ref 8.9–10.3)
Chloride: 97 mmol/L — ABNORMAL LOW (ref 98–111)
Creatinine, Ser: 2.19 mg/dL — ABNORMAL HIGH (ref 0.44–1.00)
GFR, Estimated: 23 mL/min — ABNORMAL LOW (ref >60)
Glucose, Bld: 143 mg/dL — ABNORMAL HIGH (ref 70–99)
Potassium: 3.1 mmol/L — ABNORMAL LOW (ref 3.5–5.1)
Sodium: 136 mmol/L (ref 135–145)

## 2022-01-04 LAB — PROTIME-INR
INR: 1.9 — ABNORMAL HIGH (ref 0.8–1.2)
Prothrombin Time: 21.8 seconds — ABNORMAL HIGH (ref 11.4–15.2)

## 2022-01-04 LAB — POCT INR: INR: 1.9 — AB (ref 2.0–3.0)

## 2022-01-04 MED ORDER — POTASSIUM CHLORIDE CRYS ER 20 MEQ PO TBCR: 20.0000 meq | EXTENDED_RELEASE_TABLET | Freq: Every day | ORAL | 3 refills | Status: DC

## 2022-01-04 MED ORDER — TORSEMIDE 20 MG PO TABS
40.0000 mg | ORAL_TABLET | Freq: Every day | ORAL | 3 refills | Status: DC
Start: 2022-01-04 — End: 2022-08-29

## 2022-01-04 MED ORDER — ACETAMINOPHEN 325 MG PO TABS
650.0000 mg | ORAL_TABLET | Freq: Once | ORAL | Status: AC
  Administered 2022-01-04: 650 mg via ORAL
  Filled 2022-01-04: qty 2

## 2022-01-04 MED ORDER — PANTOPRAZOLE SODIUM 20 MG PO TBEC: 20.0000 mg | DELAYED_RELEASE_TABLET | Freq: Every day | ORAL | 2 refills | Status: DC

## 2022-01-04 MED ORDER — POTASSIUM CHLORIDE 20 MEQ PO PACK
40.0000 meq | PACK | Freq: Once | ORAL | Status: AC
  Administered 2022-01-04: 40 meq via ORAL
  Filled 2022-01-04: qty 2

## 2022-01-04 MED ORDER — POTASSIUM CHLORIDE 20 MEQ PO PACK: 40.0000 meq | PACK | Freq: Two times a day (BID) | ORAL | Status: DC

## 2022-01-04 NOTE — Patient Instructions (Addendum)
Medication Instructions:  ?STOP Lasix ?START Torsemide 40 mg (2 tablets) daily  ?TAKE Potassium 20 meq daily ? ?*If you need a refill on your cardiac medications before your next appointment, please call your pharmacy* ? ? ?Follow-Up: ?At [facility], you and your health needs are our priority.  As part of our continuing mission to provide you with exceptional heart care, we have created designated Provider Care Teams.  These Care Teams include your primary Cardiologist (physician) and Advanced Practice Providers (APPs -  Physician Assistants and Nurse Practitioners) who all work together to provide you with the care you need, when you need it. ? ?We recommend signing up for the patient portal called "MyChart".  Sign up information is provided on this After Visit Summary.  MyChart is used to connect with patients for Virtual Visits (Telemedicine).  Patients are able to view lab/test results, encounter notes, upcoming appointments, etc.  Non-urgent messages can be sent to your provider as well.   ?To learn more about what you can do with MyChart, go to [email or web address].   ? ?Your next appointment:   ?2 month(s) ? ?The format for your next appointment:   ?In Person ? ?Provider:   ?Evalina Field, MD   ? ? ?Other Instructions ?Home health order- they will contact you for any information. ? ?

## 2022-01-04 NOTE — ED Notes (Signed)
Patient able to ambulate a few steps with my assistance. MD aware ?

## 2022-01-04 NOTE — Progress Notes (Signed)
error 

## 2022-01-04 NOTE — Patient Instructions (Addendum)
A few things to remember from today's visit: ? ? ?Type 2 diabetes mellitus with severe nonproliferative retinopathy without macular edema, without long-term current use of insulin, unspecified laterality ([place]) ? ?Essential hypertension ? ?Hypokalemia ? ?Hypomagnesemia ? ?Fall in home, subsequent encounter ? ?If you need refills please call your pharmacy. ?Do not use My Chart to request refills or for acute issues that need immediate attention. ?  ? ?Please be sure medication list is accurate. ?If a new problem present, please set up appointment sooner than planned today. ?Fall Prevention in the Home, Adult ?Falls can cause injuries and can happen to people of all ages. There are many things you can do to make your home safe and to help prevent falls. Ask for help when making these changes. ?What actions can I take to prevent falls? ?General Instructions ?Use good lighting in all rooms. Replace any light bulbs that burn out. ?Turn on the lights in dark areas. Use night-lights. ?Keep items that you use often in easy-to-reach places. Lower the shelves around your home if needed. ?Set up your furniture so you have a clear path. Avoid moving your furniture around. ?Do not have throw rugs or other things on the floor that can make you trip. ?Avoid walking on wet floors. ?If any of your floors are uneven, fix them. ?Add color or contrast paint or tape to clearly mark and help you see: ?Grab bars or handrails. ?First and last steps of staircases. ?Where the edge of each step is. ?If you use a stepladder: ?Make sure that it is fully opened. Do not climb a closed stepladder. ?Make sure the sides of the stepladder are locked in place. ?Ask someone to hold the stepladder while you use it. ?Know where your pets are when moving through your home. ?What can I do in the bathroom? ?  ?Keep the floor dry. Clean up any water on the floor right away. ?Remove soap buildup in the tub or shower. ?Use nonskid mats or decals on the floor of  the tub or shower. ?Attach bath mats securely with double-sided, nonslip rug tape. ?If you need to sit down in the shower, use a plastic, nonslip stool. ?Install grab bars by the toilet and in the tub and shower. Do not use towel bars as grab bars. ?What can I do in the bedroom? ?Make sure that you have a light by your bed that is easy to reach. ?Do not use any sheets or blankets for your bed that hang to the floor. ?Have a firm chair with side arms that you can use for support when you get dressed. ?What can I do in the kitchen? ?Clean up any spills right away. ?If you need to reach something above you, use a step stool with a grab bar. ?Keep electrical cords out of the way. ?Do not use floor polish or wax that makes floors slippery. ?What can I do with my stairs? ?Do not leave any items on the stairs. ?Make sure that you have a light switch at the top and the bottom of the stairs. ?Make sure that there are handrails on both sides of the stairs. Fix handrails that are broken or loose. ?Install nonslip stair treads on all your stairs. ?Avoid having throw rugs at the top or bottom of the stairs. ?Choose a carpet that does not hide the edge of the steps on the stairs. ?Check carpeting to make sure that it is firmly attached to the stairs. Fix carpet that is loose or  worn. ?What can I do on the outside of my home? ?Use bright outdoor lighting. ?Fix the edges of walkways and driveways and fix any cracks. ?Remove anything that might make you trip as you walk through a door, such as a raised step or threshold. ?Trim any bushes or trees on paths to your home. ?Check to see if handrails are loose or broken and that both sides of all steps have handrails. ?Install guardrails along the edges of any raised decks and porches. ?Clear paths of anything that can make you trip, such as tools or rocks. ?Have leaves, snow, or ice cleared regularly. ?Use sand or salt on paths during winter. ?Clean up any spills in your garage right  away. This includes grease or oil spills. ?What other actions can I take? ?Wear shoes that: ?Have a low heel. Do not wear high heels. ?Have rubber bottoms. ?Feel good on your feet and fit well. ?Are closed at the toe. Do not wear open-toe sandals. ?Use tools that help you move around if needed. These include: ?Canes. ?Walkers. ?Scooters. ?Crutches. ?Review your medicines with your doctor. Some medicines can make you feel dizzy. This can increase your chance of falling. ?Ask your doctor what else you can do to help prevent falls. ?Where to find more information ?Centers for Disease Control and Prevention, STEADI: http://www.wolf.info/ ?[employer] on Aging: http://kim-miller.com/ ?Contact a doctor if: ?You are afraid of falling at home. ?You feel weak, drowsy, or dizzy at home. ?You fall at home. ?Summary ?There are many simple things that you can do to make your home safe and to help prevent falls. ?Ways to make your home safe include removing things that can make you trip and installing grab bars in the bathroom. ?Ask for help when making these changes in your home. ?This information is not intended to replace advice given to you by your health care provider. Make sure you discuss any questions you have with your health care provider. ?Document Revised: 05/06/2020 Document Reviewed: 05/06/2020 ?Elsevier Patient Education ? 2022 [place]. ? ? ? ? ? ? ? ?

## 2022-01-04 NOTE — ED Notes (Addendum)
Trauma Response Nurse Documentation ? ? ?Regina [name] is a 81 y.o. female arriving to [facility] ED via EMS ? ?On warfarin daily. Trauma was activated as a Level 2 by ED charge RN based on the following trauma criteria Elderly patients > 65 with head trauma on anti-coagulation (excluding ASA). Trauma team at the bedside on patient arrival. Patient cleared for CT by Dr. Christy Gentles. Patient to CT with team. GCS 15. ? ?History  ? Past Medical History:  ?Diagnosis Date  ? Back pain   ? CHF (congestive heart failure) ([place])   ? 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 Chronic combined systolic and diastolic CHF (congestive heart failure) ([place])   ? Diabetes mellitus without complication ([place])   ? GERD (gastroesophageal reflux disease)   ? Heart disease   ? Hypertension   ? Hypothyroidism   ? Joint pain   ? Mini stroke   ? Non-obstructive CAD   ? a. 01/2015 Cardiolite: + inf wall ischemia, EF 56%;  b. 01/2015 Cath: LM nl, LAD 52m LCX min irregs, RCA dominant, 50-653m ? Sleep apnea   ? Swallowing difficulty   ?  ? Past Surgical History:  ?Procedure Laterality Date  ? ABDOMINAL HYSTERECTOMY    ? BUBBLE STUDY  07/01/2020  ? Procedure: BUBBLE STUDY;  Surgeon: AcElouise MunroeMD;  [facility];;  ? BUBBLE STUDY  08/05/2020  ? Procedure: BUBBLE STUDY;  Surgeon: O'Geralynn RileMD;  [facility]: [facility];;  done with definity   ? CARDIAC CATHETERIZATION    ? ESOPHAGOGASTRODUODENOSCOPY    ? a. 01/2015 EGD: patent esophagus.  ? ESOPHAGOGASTRODUODENOSCOPY N/A 01/20/2015  ? Procedure: ESOPHAGOGASTRODUODENOSCOPY (EGD);  Surgeon: PaCarol AdaMD;  [facility];  Service: Endoscopy;  Laterality: N/A;  ? HAND SURGERY    ? JOINT REPLACEMENT    ? reports history bilateral TKA and right TSA  ? LEFT HEART CATHETERIZATION WITH CORONARY ANGIOGRAM N/A 01/19/2015  ? RIGHT HEART CATH N/A 12/27/2021  ? Procedure: RIGHT HEART CATH;  Surgeon: BeLorretta Harp MD;  [facility];  Service: Cardiovascular;  Laterality: N/A;  ? TEE WITHOUT CARDIOVERSION  05/04/2020  ? TEE WITHOUT CARDIOVERSION N/A 05/05/2020  ? Procedure: TRANSESOPHAGEAL ECHOCARDIOGRAM (TEE);  Surgeon: HiPixie CasinoMD;  [facility];  Service: Cardiovascular;  Laterality: N/A;  ? TEE WITHOUT CARDIOVERSION N/A 07/01/2020  ? Procedure: TRANSESOPHAGEAL ECHOCARDIOGRAM (TEE);  Surgeon: AcElouise MunroeMD;  [facility];  Laterality: N/A;  ? TEE WITHOUT CARDIOVERSION N/A 08/05/2020  ? Procedure: TRANSESOPHAGEAL ECHOCARDIOGRAM (TEE);  Surgeon: O'Geralynn RileMD;  [facility]: Cardiovascular;  Laterality: N/A;  ? THYROIDECTOMY    ?  ? ? ? ?Initial Focused Assessment (If applicable, or please see trauma documentation): ?EMS c-collar and PIV in place ?Small laceration on lip ? ?CT's Completed:   ?CT Head and CT C-Spine  ? ?Interventions:  ? ?Plan for disposition:  ?Pending at the time of this note  ? ?Consults completed:  ?none at the time of this note. ? ?Event Summary: ?Patient arrives via EMS from home, fall forward off the toilet. Cut her lip. No LOC. On coumadin, no missed doses. Recent discharge from hospital on 3/14, right heart cath. Pending CT images. ? ?MTP Summary (If applicable): NA ? ?Bedside handoff with ED RN [place]  ? ?Em[place]?Trauma Response RN ? ?Please call TRN  at [phone] for further assistance. ?  ?

## 2022-01-04 NOTE — ED Provider Notes (Signed)
?[facility] ?Provider Note ? ? ?CSN: [id number] ?Arrival date & time: 01/04/22  7416 ? ?  ? ?History ? ?Chief Complaint - fall, head injury ? ? ?Regina [name] is a 81 y.o. female. ? ?HPI ?Patient presents as a fall.  Patient was in the bathroom stood up from the toilet and tripped and fell and hit her head.  She reports landing flat on her face.  She is on Coumadin.  She has a headache and neck pain.  She has some blood in her mouth.  No other acute complaints.  Her course is stable at this time. ?She may have had brief LOC ?She presented as a level 2 trauma ?  ? ?Home Medications ?Prior to Admission medications   ?Medication Sig Start Date End Date Taking? Authorizing Provider  ?Alcohol Swabs (B-D SINGLE USE SWABS REGULAR) PADS Use to test blood sugar up to 3 times daily 01/11/21   Shamleffer, Melanie Crazier, MD  ?allopurinol (ZYLOPRIM) 100 MG tablet TAKE ONE TABLET BY MOUTH EVERY MORNING 11/11/21   [name], [name], MD  ?aspirin EC 81 MG EC tablet Take 1 tablet (81 mg total) by mouth daily. Swallow whole. 12/29/21   Pokhrel, Corrie Mckusick, MD  ?atorvastatin (LIPITOR) 20 MG tablet TAKE ONE TABLET BY MOUTH EVERYDAY AT BEDTIME 12/13/21   [name], [name], MD  ?Blood Glucose Monitoring Suppl (ACCU-CHEK GUIDE) w/Device KIT 1 Device by Does not apply route 3 (three) times daily. ?Patient not taking: Reported on 12/29/2021 06/10/19   [name], [name], MD  ?Capsaicin 0.033 % CREA Apply 1 application topically 3 (three) times daily. 05/14/21   Shamleffer, Melanie Crazier, MD  ?Cholecalciferol (VITAMIN D3) 50 MCG (2000 UT) capsule Take 2,000 Units by mouth daily.    [provider]  ?COMFORT EZ PEN NEEDLES 31G X 8 MM MISC USE 3 TO 4 times daily as directed ?Patient not taking: Reported on 12/29/2021 10/14/21   Shamleffer, Melanie Crazier, MD  ?Continuous Blood Gluc Sensor (DEXCOM G6 SENSOR) MISC 1 Device by Does not apply route as directed. 01/08/21   Shamleffer, Melanie Crazier, MD   ?Continuous Blood Gluc Transmit (DEXCOM G6 TRANSMITTER) MISC 1 Device by Does not apply route as directed. 01/08/21   Shamleffer, Melanie Crazier, MD  ?famotidine (PEPCID) 20 MG tablet Take 1 tablet (20 mg total) by mouth at bedtime. 04/14/21   [name], [name], MD  ?FARXIGA 10 MG TABS tablet Take 1 tablet (10 mg total) by mouth daily. 04/23/21   O'NealCassie Freer, MD  ?furosemide (LASIX) 80 MG tablet Take 1 tablet (80 mg total) by mouth 2 (two) times daily. 12/28/21   Pokhrel, Corrie Mckusick, MD  ?gabapentin (NEURONTIN) 600 MG tablet TAKE ONE TABLET BY MOUTH EVERY MORNING and TAKE ONE TABLET BY MOUTH EVERYDAY AT BEDTIME 10/13/21   [name], [name], MD  ?glucose blood (ACCU-CHEK GUIDE) test strip Use to test blood sugar up to 3 times daily ?Patient not taking: Reported on 12/29/2021 04/21/21   Shamleffer, Melanie Crazier, MD  ?insulin glargine, 1 Unit Dial, (TOUJEO SOLOSTAR) 300 UNIT/ML Solostar Pen Inject 26 Units into the skin daily. Eat a snack with protein nightly before bedtime. 04/21/21   Shamleffer, Melanie Crazier, MD  ?insulin lispro (HUMALOG KWIKPEN) 100 UNIT/ML KwikPen Max daily 15 units ?Patient taking differently: 0-8 Units 3 (three) times daily. Max daily 15 units. Sliding Scale: If BS>160=0 units, 161-190=1 unit, 191-220=2 units, 221-250=3 units, 251-280=4 units, 281-310=5 units, 311-340=6 units, 341-370=7 units, 371-400=8 units 04/21/21  Shamleffer, Melanie Crazier, MD  ?Insulin Pen Needle 32G X 4 MM MISC 1 Device by Does not apply route in the morning, at noon, in the evening, and at bedtime. 01/11/21   Shamleffer, Melanie Crazier, MD  ?ipratropium (ATROVENT) 0.06 % nasal spray Place 2 sprays into both nostrils 4 (four) times daily. 09/14/21   [name], [name], MD  ?Lancets Misc. (ACCU-CHEK FASTCLIX LANCET) KIT Use to test blood sugar up to 3 times daily 04/21/21   Shamleffer, Melanie Crazier, MD  ?levothyroxine (SYNTHROID) 150 MCG tablet TAKE ONE TABLET BY MOUTH BEFORE BREAKFAST 10/13/21   [name], [name], MD   ?magnesium oxide (MAG-OX) 400 MG tablet TAKE ONE TABLET BY MOUTH EVERY MORNING 11/11/21   [name], [name], MD  ?meclizine (ANTIVERT) 25 MG tablet Take 25 mg by mouth daily as needed for dizziness. 10/01/14   [provider]  ?metoprolol succinate (TOPROL-XL) 25 MG 24 hr tablet Take 1 tablet (25 mg total) by mouth daily. Take with or immediately following a meal. 07/12/21   O'Neal, Cassie Freer, MD  ?nitroGLYCERIN (NITROSTAT) 0.4 MG SL tablet Place 1 tablet under tongue for chest pain. Repeat every five minutes 2 more times if needed for a total of 3 tablets within 15 minutes. 12/24/21   O'NealCassie Freer, MD  ?Polyethylene Glycol 3350 (MIRALAX PO) Take 1 Package by mouth daily as needed (constipation).    [provider]  ?Propylene Glycol (SYSTANE BALANCE OP) Place 1 drop into both eyes daily as needed (for dry eyes).    [provider]  ?SALINE MIST SPRAY NA Place 2 sprays into the nose at bedtime.    [provider]  ?Tafamidis 61 MG CAPS Take 61 mg by mouth daily. 08/19/21   O'NealCassie Freer, MD  ?warfarin (COUMADIN) 5 MG tablet TAKE 1/2 TO 1 TABLET BY MOUTH daily OR AS DIRECTED by THE coumadin clinic ?Patient taking differently: Take 2.5-5 mg by mouth as directed. Take 1/2 tablet (2.5 mg) Daily Except On Sundays Take 1 tablet (5 mg) or as directed by the Coumadin clinic 12/13/21   O'Neal, Cassie Freer, MD  ?   ? ?Allergies    ?Ace inhibitors, Amlodipine, Atenolol, Avandia [rosiglitazone], Darvon [propoxyphene], Erythromycin, Hydralazine, Hydrocodone, Levofloxacin, Morphine and related, Percocet [oxycodone-acetaminophen], Spironolactone, and Tramadol   ? ?Review of Systems   ?Review of Systems  ?Constitutional:  Negative for fever.  ?Cardiovascular:  Negative for chest pain.  ?Gastrointestinal:  Negative for abdominal pain.  ?Musculoskeletal:  Positive for neck pain. Negative for back pain.  ?Neurological:  Positive for headaches.  ? ?Physical Exam ?Updated Vital  Signs ?BP 124/74 (BP Location: Left Arm)   Pulse 91   Temp 98.8 ?F (37.1 ?C) (Oral)   Resp 13   Ht 1.575 m (5' 2" )   Wt 98.9 kg   SpO2 99%   BMI 39.87 kg/m?  ?Physical Exam ?CONSTITUTIONAL: Elderly, no acute distress ?HEAD: Normocephalic/atraumatic, no signs of trauma ?EYES: EOMI/PERRL ?ENMT: Mucous membranes moist, no blood in the nares, face is stable.  Small amount of blood in her mouth.  Poor dentition noted, but no acute fractures are noted.  Patient has chronic fracture in her central incisor. ?NECK: Cervical collar in place ?SPINE/BACK: C-spine tenderness noted, no T or L spine tenderness, no bruising/crepitance/stepoffs noted to spine ?Patient maintained in spinal precautions/logroll utilized ?CV: No loud murmur ?LUNGS: Lungs are clear to auscultation bilaterally, no apparent distress ?Chest-no bruising or tenderness ?ABDOMEN: soft, nontender, no bruising ?GU:no cva tenderness ?NEURO: Pt  is awake/alert/appropriate, moves all extremitiesx4.  No facial droop.  GCS 15 ?EXTREMITIES: pulses normal/equal, full ROM, pelvis stable ?All other extremities/joints palpated/ranged and nontender ?SKIN: warm, color normal ?PSYCH: no abnormalities of mood noted, alert and oriented to situation ? ?ED Results / Procedures / Treatments   ?Labs ?(all labs ordered are listed, but only abnormal results are displayed) ?Labs Reviewed  ?PROTIME-INR - Abnormal; Notable for the following components:  ?    Result Value  ? Prothrombin Time 21.8 (*)   ? INR 1.9 (*)   ? All other components within normal limits  ?CBC WITH DIFFERENTIAL/PLATELET  ?BASIC METABOLIC PANEL  ? ? ?EKG ?EKG Interpretation ? ?Date/Time:  Tuesday January 04 2022 06:39:25 EDT ?Ventricular Rate:  84 ?PR Interval:    ?QRS Duration: 85 ?QT Interval:  403 ?QTC Calculation: 477 ?R Axis:   64 ?Text Interpretation: Atrial fibrillation Low voltage, precordial leads Nonspecific repol abnormality, diffuse leads No significant change since last tracing Confirmed by  Ripley Fraise [phone]) on 01/04/2022 6:56:36 AM ? ?Radiology ?CT Head Wo Contrast ? ?Result Date: 01/04/2022 ?CLINICAL DATA:  Neck trauma.  Fall on blood thinners EXAM: CT HEAD WITHOUT CONTRAST CT CERVICAL SPINE WITHOUT CONTRAST TECHNIQ

## 2022-01-04 NOTE — ED Provider Notes (Signed)
Care of patient assumed from Dr. Christy Gentles at 7:30 AM.  This patient had a fall.  She is on Coumadin.  Her imaging results are negative.  She did undergo a trial of ambulation and her mobility appears to be at baseline.  Currently awaiting results of BMP.  Discharge pending results of BMP. ?Physical Exam  ?BP 124/74 (BP Location: Left Arm)   Pulse 91   Temp 98.8 ?F (37.1 ?C) (Oral)   Resp 13   Ht '5\' 2"'$  (1.575 m)   Wt 98.9 kg   SpO2 99%   BMI 39.87 kg/m?  ? ?Physical Exam ?Constitutional:   ?   Appearance: Normal appearance.  ?HENT:  ?   Head: Normocephalic and atraumatic.  ?   Right Ear: External ear normal.  ?   Left Ear: External ear normal.  ?   Nose: Nose normal.  ?Cardiovascular:  ?   Rate and Rhythm: Normal rate.  ?Pulmonary:  ?   Effort: Pulmonary effort is normal. No respiratory distress.  ?Abdominal:  ?   General: There is no distension.  ?   Palpations: Abdomen is soft.  ?Musculoskeletal:  ?   Cervical back: No rigidity.  ?Skin: ?   General: Skin is warm and dry.  ?Neurological:  ?   General: No focal deficit present.  ?   Mental Status: She is alert and oriented to person, place, and time.  ?Psychiatric:     ?   Mood and Affect: Mood normal.     ?   Behavior: Behavior normal.  ? ? ?Procedures  ?Procedures ? ?ED Course / MDM  ? ?Clinical Course as of 01/04/22 0729  ?Tue Jan 04, 2022  ?0727 Signed out to dr Doren Custard at shift change [DW]  ?  ?Clinical Course User Index ?[DW] Ripley Fraise, MD  ? ?Medical Decision Making ?Amount and/or Complexity of Data Reviewed ?Labs: ordered. ?Radiology: ordered. ?ECG/medicine tests: ordered. ? ?Risk ?OTC drugs. ?Prescription drug management. ? ? ?BMP showed baseline CKD with some hypokalemia.  Replacement potassium was given.  Patient was discharged in stable condition. ? ? ? ? ?  ?Godfrey Pick, MD ?01/05/22 [phone] ? ?

## 2022-01-04 NOTE — Progress Notes (Signed)
Orthopedic Tech Progress Note ?Patient Details:  ?Regina [name] ?[birth year] ?[id number] ? ?Patient ID: Regina [name], female   DOB: Sep 28, 1941, 81 y.o.   MRN: [id number] ?I attended trauma page. ?Karolee Stamps ?01/04/2022, 6:53 AM ? ?

## 2022-01-04 NOTE — Patient Instructions (Addendum)
Description   ? Take a whole tablet today and then continue taking 1/2 tablet daily except 1 tablet each Sunday.  Repeat INR in 2 weeks; [id number];  ? ? ?  ? ? ?

## 2022-01-04 NOTE — ED Triage Notes (Signed)
Pt BIB [facility] from home c/o a fall. Pt was in the bathroom and got up from the toilet, got tripped up and fell and hit her head. Pt is on a blood thinner. Pt states she does have a headache currently. No obvious bleeding or lacerations to the head.  ? ? ?18g LAC ?146/110 ?90-100 HR Afib ?98% RA  ?208 CBG ?

## 2022-01-04 NOTE — Chronic Care Management (AMB) (Signed)
? ? ?  Chronic Care Management ?Pharmacy Assistant  ? ?Name: Regina [name]  MRN: [id number] DOB: 1940/11/01 ? ?01/05/2022 APPOINTMENT REMINDER ? ? ?[name] daughter was reminded to have all medications, supplements and any blood glucose and blood pressure readings available for review with [name], Pharm. D, at her telephone visit on 01/05/2022 at 9:00. ? ? ?Questions: ?Have you had any recent office visit or specialist visit outside of [place]? Patient's daughter denies any recent visits outside of [facility].  ? ?Are there any concerns you would like to discuss during your office visit? Patient's daughter denies any concern at this time. ? ?Are you having any problems obtaining your medications? (Whether it pharmacy issues or cost) Patient's daughter denies any issues getting medications.  ? ?If patient has any PAP medications ask if they are having any problems getting their PAP medication or refill?  Patient's daughter denies any medications from pap.  She did mention patient is on a new medication and she thinks it is a trial medication.  She is unsure of the name however, will have this available for tomorrows visit.  ? ?Care Gaps: ?AWV - scheduled for 02/16/22 ?Last A1C - 7.6 on 08/13/2021 ?Last BP - 128/80 on 11/08/2021 ?Zoster vaccines - due ?[place] vaccine - overdue ?Foot exam - overdue ?Covid-19 vaccine - overdue  ?  ?Star Rating Drugs: ?Atorvastatin '20mg'$  - last filled on 12/23/2021 30DS at [facility] ?[place] '10mg'$  - last filled on 12/23/2021 30DS at [facility] ? ?Any gaps in medications fill history? No  ? ?Gennie Alma CMA  ?Clinical Pharmacist Assistant ?[phone] ? ?

## 2022-01-05 ENCOUNTER — Ambulatory Visit (INDEPENDENT_AMBULATORY_CARE_PROVIDER_SITE_OTHER): Payer: Medicare Other | Admitting: Pharmacist

## 2022-01-05 DIAGNOSIS — E113499 Type 2 diabetes mellitus with severe nonproliferative diabetic retinopathy without macular edema, unspecified eye: Secondary | ICD-10-CM

## 2022-01-05 DIAGNOSIS — I10 Essential (primary) hypertension: Secondary | ICD-10-CM

## 2022-01-05 NOTE — Patient Instructions (Signed)
Hi Regina [name], ? ?It was great to catch up with you again! I am glad you are feeling better. ? ?Please reach out to me if you have any questions or need anything before our follow up! ? ?Best, ?[name] ? ?[name], PharmD, BCACP ?Clinical Pharmacist ?Therapist, music at [facility] ?[phone] ? ? Visit Information ? ? Goals Addressed   ?None ?  ? ?Patient Care Plan: [place]  ?  ? ?Problem Identified: Problem: Hypertension, Hyperlipidemia, Diabetes, Atrial Fibrillation, Heart Failure, Coronary Artery Disease, GERD, Chronic Kidney Disease, Hypothyroidism, Osteoarthritis, and vitamin D deficiency   ?  ? ?Long-Range Goal: Patient-Specific Goal   ?Start Date: 04/13/2021  ?Expected End Date: 04/13/2022  ?Recent Progress: On track  ?Priority: High  ?Note:   ?Current Barriers:  ?Unable to independently monitor therapeutic efficacy ?Unable to achieve control of diabetes  ? ?Pharmacist Clinical Goal(s):  ?Patient will achieve adherence to monitoring guidelines and medication adherence to achieve therapeutic efficacy ?achieve control of diabetes as evidenced by A1c  through collaboration with PharmD and provider.  ? ?Interventions: ?1:1 collaboration with [name], [name], MD regarding development and update of comprehensive plan of care as evidenced by provider attestation and co-signature ?Inter-disciplinary care team collaboration (see longitudinal plan of care) ?Comprehensive medication review performed; medication list updated in electronic medical record ? ?Hypertension (BP goal <130/80) ?-Controlled ?-Current treatment: ?Metoprolol succinate 25 mg 1 tablet daily - Appropriate, Effective, Safe, Accessible ?-Medications previously tried: none  ?-Current home readings: none to report ?-Current dietary habits: limits salt intake ?-Current exercise habits: trying to walk some ?-Denies hypotensive/hypertensive symptoms ?-Educated on Exercise goal of 150 minutes per week; ?Importance of home blood pressure  monitoring; ?Proper BP monitoring technique; ?-Counseled to monitor BP at home at least weekly, document, and provide log at future appointments ?-Counseled on diet and exercise extensively ?Recommended bringing BP cuff to next appointment to compare readings and make sure her machine is not off. Patient reports she has been feeling out of sorts. ? ?Hyperlipidemia: (LDL goal < 70) ?-Controlled ?-Current treatment: ?Atorvastatin 20 mg 1 tablet daily - Appropriate, Effective, Safe, Accessible ?-Medications previously tried: fish oil  ?-Current dietary patterns: does not fry foods ?-Current exercise habits: trying to walk some ?-Educated on Cholesterol goals;  ?Importance of limiting foods high in cholesterol; ?Exercise goal of 150 minutes per week; ?-Counseled on diet and exercise extensively ?Recommended to continue current medication ?Recommended to stop fish oil as she likely does not need based on triglycerides. ? ?CAD (Goal: prevent heart events) ?-Controlled ?-Current treatment  ?Warfarin 5 mg as directed by coumadin clinic  - Appropriate, Effective, Safe, Accessible ?Nitroglycerin 0.4 mg 1 tablet as needed - Appropriate, Effective, Safe, Accessible ?Aspirin 81 mg 1 tablet daily - Appropriate, Effective, Query Safe, Accessible ?-Medications previously tried: none  ?-Recommended checking nitroglycerin expiration date and making sure her on supply has not expired ? ? ?Diabetes (A1c goal <7%) ?-Uncontrolled ?-Current medications: ?Farxiga 10 mg 1 tablet daily - Appropriate, Effective, Safe, Query accessible ?Toujeo U-300 inject 26 units nightly - Appropriate, Effective, Safe, Accessible ?Humalog U-100 max 15 units daily (sliding scale) - Appropriate, Effective, Safe, Accessible ?-Medications previously tried: n/a  ?-Current home glucose readings ?fasting glucose: 79, 109, 90, 105, 105, 79, 79, 105, 93 ?post prandial glucose: 99, sometimes up to 200, 220 ?-Denies hypoglycemic/hyperglycemic symptoms ?-Current meal  patterns:  ?breakfast: peaches, 2 eggs ?lunch: same as dinner ?dinner: sometimes potatoes with protein ?snacks: popcorn, graham crackers ?drinks: n/a ?-Current exercise: trying to walk  some ?-Educated on A1c and blood sugar goals; ?Continuous glucose monitoring; ?Carbohydrate counting and/or plate method ?-Counseled to check feet daily and get yearly eye exams ?-Counseled on diet and exercise extensively ?Recommended to continue current medication ?Requested prescription for Dexcom to be sent to [facility] pharmacy for cost savings.   ? ?Atrial Fibrillation (Goal: prevent stroke and major bleeding) ?-Controlled ?-CHADSVASC: 9 ?-Current treatment: ?Rate control: Rate control: metoprolol succinate 25 mg 1 tablet daily - Appropriate, Effective, Safe, Accessible ?Anticoagulation: warfarin 5 mg as directed by coumadin clinic - Appropriate, Effective, Safe, Accessible ?-Medications previously tried: n/a ?-Home BP and HR readings: refer to above  ?-Counseled on increased risk of stroke due to Afib and benefits of anticoagulation for stroke prevention; ?avoidance of NSAIDs due to increased bleeding risk with anticoagulants; ?-Counseled on diet and exercise extensively ?Recommended to continue current medication ?Educated on keeping green intake consistent with warfarin use. ? ?Heart Failure (Goal: manage symptoms and prevent exacerbations) ?-Controlled ?-Last ejection fraction: 10-15% (Date: 08/05/20) ?-HF type: Systolic ?-NYHA Class: III (marked limitation of activity) ?-AHA HF Stage: C (Heart disease and symptoms present) ?-Current treatment: ?metoprolol succinate 25 mg 1 tablet daily - Appropriate, Effective, Safe, Accessible ?Torsemide 20 mg 1 tablet twice daily - Appropriate, Effective, Safe, Accessible ?Potassium chloride 20 mEq 1 tablet daily - Appropriate, Effective, Safe, Accessible ?-Medications previously tried: n/a  ?-Current home BP/HR readings: refer to above ?-Current dietary habits: uses Mrs. Dash and doesn't  use salt ?-Current exercise habits: trying to walk some ?-Educated on Importance of weighing daily; if you gain more than 3 pounds in one day or 5 pounds in one week, call cardiologist ?Proper diuretic administration and potassium supplementation ?Importance of blood pressure control ?-Counseled on dosing of torsemide and recommended verifying dose with cardiology. ?Patient's weight has been fluctuating and she is taking 2 daily for the last few days which may explain her lower BP. ? ?Hypothyroidism (Goal: TSH 0.35-4.5) ?-Controlled ?-Current treatment  ?Levothyroxine 150 mcg 1 tablet daily - Appropriate, Effective, Safe, Accessible ?-Medications previously tried: none  ?-Counseled on importance of timing with this medication and taking on an empty stomach. Patient will set an alarm to remember to take daily. ? ?Restless legs syndrome/neuropathy (Goal: minimize symptoms) ?-Controlled ?-Current treatment  ?Gabapentin 600 mg 1 tablet twice daily - Appropriate, Effective, Safe, Accessible ?-Medications previously tried: none  ?-Recommended to continue current medication ? ?Gout (Goal: prevent flare ups) ?-Not ideally controlled ?-Current treatment  ?Allopurinol 100 mg 1 tablet daily - Appropriate, Effective, Safe, Accessible ?-Medications previously tried: none  ?-Recommended uric acid level. ?Patient's hands flare up all the time and she is unsure if this is gout. ? ?Cardiac amyloidosis  (Goal: minimize symptoms) ?-Controlled ?-Current treatment  ?Tafamidis 61 mg 1 capsule daily - Appropriate, Effective, Safe, Accessible ?-Medications previously tried: none  ?-Recommended to continue current medication ?Assessed patient finances. Patient reports this is expensive but likely wouldn't qualify for assistance. ? ?GERD (Goal: minimize symptoms) ?-Controlled ?-Current treatment  ?Famotidine 20 mg 1 tablet at bedtime as needed - Appropriate, Query effective, Safe, Accessible ?Pantoprazole 20 mg 1 tablet daily - not  started ?-Medications previously tried: pantoprazole  ?-Recommended to continue current medication ?Counseled on differences between pantoprazole and famotidine. ? ?Allergic rhinitis (Goal: minimize symptoms) ?-Cont

## 2022-01-05 NOTE — Progress Notes (Signed)
? ?Chronic Care Management ?Pharmacy Note ? ?01/05/2022 ?Name:  Regina [name] MRN:  [id number] DOB:  06-07-1941 ? ?Summary: ?A1c not at goal < 7% ?Pt reports medications are expensive ? ?Recommendations/Changes made from today's visit: ?-Recommended follow up with endo for insulin dose adjustments ?-Requested Dexcom sensor and transmitter prescriptions due to lower cost at pharmacy ?-Inquired about use of aspirin in addition to warfarin with cardiologist ? ?Plan: ?Follow up for BP and DM assessment in 2 months ?Follow up in 4 months ? ?Subjective: ?Regina [name] is an 81 y.o. year old female who is a primary patient of [name], Malka So, MD.  The CCM team was consulted for assistance with disease management and care coordination needs.   ? ?Engaged with patient by telephone for follow up visit in response to provider referral for pharmacy case management and/or care coordination services.  ? ?Consent to Services:  ?The patient was given information about Chronic Care Management services, agreed to services, and gave verbal consent prior to initiation of services.  Please see initial visit note for detailed documentation.  ? ?Patient Care Team: ?[name], [name], MD as PCP - General (Family Medicine) ?Geralynn Rile, MD as PCP - Cardiology (Internal Medicine) ?Viona Gilmore, [facility] as Pharmacist (Pharmacist) ?Dimitri Ped, RN as Case Manager ? ?Recent office visits: ?01/04/22 [name] MD: Patient presented for ED visit follow up. Resume KlorCon 20 mEq today. Prescribed pantoprazole 20 mg daily. ? ?12/02/21 Colin Benton, DO: Patient presented for video visit for COVID 19 infection. Prescribed molnupiravir. ? ?11/08/2021 [name] MD - LUQ abdominal pain and additional issue. No medication changes. Follow up if symptoms worsen or fail to improve.  ? ?10/12/2021 [name] MD - Patient was seen for Avulsion of toenail and additional issues. No medication changes.  Follow up if symptoms worsen or  fail to improve, for Keep next f/u appt. ? ?Recent consult visits: ?01/04/22 Frederik Schmidt, RN (cardiology): Patient presented for anti-coag visit. INR 1.9, goal 2-3. Boosted dose x 1 day. Continued warfarin 5 mg (5 mg x 1) every Sun; 2.5 mg (5 mg x 0.5) all other days. ? ?01/04/22 Geralynn Rile MD (Cardiology): Patient presented for Afib follow up. Referred placed for home health. D/c'd furosemide and started torsemide 40 mg 2 tablets daily and potassium 20 mEq daily. ? ?12/13/21 Rexene Edison, NP (pulmonary): Patient presented for OSA follow up. Patient does want to get on OSA. ? ?11/23/21 Geralynn Rile MD (Cardiology): Patient presented for Afib follow up. ? ?09/01/2021 Geralynn Rile MD (cardiology) - Patient was seen for Hereditary Cardiac Amyloidosis and additional issues. Take Torsemide 40 mg twice daily for 3 days, then decrease to once daily. Follow up if symptoms worsen or fail to improve. ?  ?08/13/2021 Cloyd Stagers MD (endocrinology) - Patient was seen for Type 2 diabetes mellitus and additional issues. No medication changes. No follow up noted ? ?06/18/21 Marcial Pacas, MD (neurology): Patient presented for initial visit for neck pain and headache. A1c increased to 7.2%. ? ? ?Hospital visits: ?Patient visited [facility] ER on 01/04/22 due to concussion with unknown loss of conscious status and was present for 2 hours. ?  ?New? Medications Started at [facility] Discharge:?? ?-started None. ?  ?Medication Changes at Hospital Discharge: ?-Changed None.  ?  ?Medications Discontinued at Hospital Discharge: ?-Stopped None.  ?  ?Medications that remain the same after Hospital Discharge:?? ?-All other medications will remain the same.  ? ?Patient admitted  to [facility] on 3/10-3/14/23 due to chest pain. ?  ?New? Medications Started at [facility] Discharge:?? ?-started furosemide and aspirin 81 mg daily. ?  ?Medication Changes at Hospital Discharge: ?-Changed  None.  ?  ?Medications Discontinued at Hospital Discharge: ?-Stopped torsemide and tizanidine.  ?  ?Medications that remain the same after Hospital Discharge:?? ?-All other medications will remain the same.    ?  ? ?Objective: ? ?Lab Results  ?Component Value Date  ? CREATININE 2.19 (H) 01/04/2022  ? BUN 50 (H) 01/04/2022  ? GFR 24.47 (L) 08/13/2021  ? GFRNONAA 23 (L) 01/04/2022  ? GFRAA 26 10/20/2021  ? NA 136 01/04/2022  ? K 3.1 (L) 01/04/2022  ? CALCIUM 9.2 01/04/2022  ? CO2 26 01/04/2022  ? GLUCOSE 143 (H) 01/04/2022  ? ? ?Lab Results  ?Component Value Date/Time  ? HGBA1C 7.6 (A) 08/13/2021 02:25 PM  ? HGBA1C 7.9 (H) 08/11/2021 08:15 AM  ? HGBA1C 7.2 (H) 06/18/2021 11:10 AM  ? FRUCTOSAMINE 325 (H) 09/09/2019 08:57 AM  ? FRUCTOSAMINE 349 (H) 05/22/2019 04:31 PM  ? GFR 24.47 (L) 08/13/2021 03:01 PM  ? GFR 20.67 (L) 02/15/2021 11:32 AM  ? MICROALBUR <0.7 08/13/2021 03:01 PM  ? MICROALBUR 1.9 05/22/2019 04:31 PM  ?  ?Last diabetic Eye exam:  ?Lab Results  ?Component Value Date/Time  ? HMDIABEYEEXA Retinopathy (A) 06/18/2019 12:00 AM  ?  ?Last diabetic Foot exam: No results found for: HMDIABFOOTEX  ? ?Lab Results  ?Component Value Date  ? CHOL 115 08/13/2021  ? HDL 54.80 08/13/2021  ? [place] 40 08/13/2021  ? TRIG 101.0 08/13/2021  ? CHOLHDL 2 08/13/2021  ? ? ? ?  Latest Ref Rng & Units 10/20/2021  ? 12:00 AM 04/25/2020  ?  1:22 AM 04/21/2020  ? 11:55 AM  ?Hepatic Function  ?Total Protein 6.5 - 8.1 g/dL  7.7   7.8    ?Albumin 3.5 - 5.0 4.1      3.6     ?AST 15 - 41 U/L  24     ?ALT 0 - 44 U/L  18     ?Alk Phosphatase 38 - 126 U/L  51     ?Total Bilirubin 0.3 - 1.2 mg/dL  0.7     ?Bilirubin, Direct 0.0 - 0.2 mg/dL  <0.1     ?  ? This result is from an external source.  ? ? ?Lab Results  ?Component Value Date/Time  ? TSH 0.65 06/09/2021 11:06 AM  ? TSH 3.24 06/08/2020 08:35 AM  ? ? ? ?  Latest Ref Rng & Units 01/04/2022  ?  6:43 AM 12/28/2021  ?  2:51 AM 12/27/2021  ?  9:08 AM  ?CBC  ?WBC 4.0 - 10.5 K/uL 6.8   7.6      ?Hemoglobin 12.0 - 15.0 g/dL 13.3   12.5   12.6    ?Hematocrit 36.0 - 46.0 % 41.0   39.9   37.0    ?Platelets 150 - 400 K/uL 290   269     ? ? ?Lab Results  ?Component Value Date/Time  ? VD25OH 55.3 03/24/2020 02:28 PM  ? VD25OH 59.65 05/22/2019 04:31 PM  ? ? ?Clinical ASCVD: Yes  ?The ASCVD Risk score (Arnett DK, et al., 2019) failed to calculate for the following reasons: ?  The patient has a prior MI or stroke diagnosis   ? ? ?  01/04/2022  ?  2:07 PM 12/30/2021  ? 10:02 AM 10/12/2021  ?  9:30  PM  ?Depression screen PHQ 2/9  ?Decreased Interest 0 0 0  ?Down, Depressed, Hopeless 0 0 0  ?PHQ - 2 Score 0 0 0  ?  ?CHA2DS2/VAS Stroke Risk Points  Current as of 11 minutes ago  ?   9 >= 2 Points: High Risk  ?1 - 1.99 Points: Medium Risk  ?0 Points: Low Risk  ?  Last Change:    ?  ? Details   ? This score determines the patient's risk of having a stroke if the  ?patient has atrial fibrillation.  ?  ?  ? Points Metrics  ?1 Has Congestive Heart Failure:  Yes   ? Current as of 11 minutes ago  ?1 Has Vascular Disease:  Yes   ? Current as of 11 minutes ago  ?1 Has Hypertension:  Yes   ? Current as of 11 minutes ago  ?2 Age:  74   ? Current as of 11 minutes ago  ?1 Has Diabetes:  Yes   ? Current as of 11 minutes ago  ?2 Had Stroke:  Yes  Had TIA:  No  Had Thromboembolism:  No   ? Current as of 11 minutes ago  ?1 Female:  Yes   ? Current as of 11 minutes ago  ?  ? ? ?Social History  ? ?Tobacco Use  ?Smoking Status Never  ?Smokeless Tobacco Never  ? ?BP Readings from Last 3 Encounters:  ?01/04/22 128/80  ?01/04/22 110/68  ?01/04/22 124/74  ? ?Pulse Readings from Last 3 Encounters:  ?01/04/22 87  ?01/04/22 89  ?01/04/22 64  ? ?Wt Readings from Last 3 Encounters:  ?01/04/22 224 lb (101.6 kg)  ?01/04/22 224 lb 3.2 oz (101.7 kg)  ?01/04/22 218 lb (98.9 kg)  ? ?BMI Readings from Last 3 Encounters:  ?01/04/22 40.97 kg/m?  ?01/04/22 41.01 kg/m?  ?01/04/22 39.87 kg/m?  ? ? ?Assessment/Interventions: Review of patient past medical  history, allergies, medications, health status, including review of consultants reports, laboratory and other test data, was performed as part of comprehensive evaluation and provision of chronic care management servi

## 2022-01-06 ENCOUNTER — Other Ambulatory Visit: Payer: Self-pay

## 2022-01-06 MED ORDER — DEXCOM G6 SENSOR MISC: 1.0000 | 3 refills | Status: DC

## 2022-01-06 MED ORDER — DEXCOM G6 TRANSMITTER MISC: 1.0000 | 3 refills | Status: DC

## 2022-01-10 ENCOUNTER — Ambulatory Visit: Payer: Medicare Other | Admitting: General Practice

## 2022-01-10 ENCOUNTER — Ambulatory Visit: Payer: Medicare Other | Admitting: Family Medicine

## 2022-01-11 ENCOUNTER — Telehealth: Payer: Self-pay | Admitting: Cardiovascular Disease

## 2022-01-11 NOTE — Telephone Encounter (Signed)
Cari from the [facility] called and asked to speak with Tommy Medal, Pharmacist. Please call her back at her direct number at [phone] ?

## 2022-01-12 ENCOUNTER — Encounter (HOSPITAL_COMMUNITY): Payer: Self-pay

## 2022-01-12 ENCOUNTER — Ambulatory Visit (HOSPITAL_COMMUNITY)
Admit: 2022-01-12 | Discharge: 2022-01-12 | Disposition: A | Discharge status: Home / Self Care | Payer: Medicare Other | Source: Ambulatory Visit | Attending: Family Medicine | Admitting: Family Medicine

## 2022-01-12 ENCOUNTER — Other Ambulatory Visit (HOSPITAL_COMMUNITY): Payer: Self-pay

## 2022-01-12 ENCOUNTER — Telehealth: Payer: Self-pay | Admitting: Pharmacist

## 2022-01-12 VITALS — BP 90/62 | HR 93 | Wt 225.0 lb

## 2022-01-12 DIAGNOSIS — I5022 Chronic systolic (congestive) heart failure: Secondary | ICD-10-CM

## 2022-01-12 DIAGNOSIS — I43 Cardiomyopathy in diseases classified elsewhere: Secondary | ICD-10-CM | POA: Diagnosis not present

## 2022-01-12 DIAGNOSIS — I130 Hypertensive heart and chronic kidney disease with heart failure and stage 1 through stage 4 chronic kidney disease, or unspecified chronic kidney disease: Secondary | ICD-10-CM | POA: Diagnosis not present

## 2022-01-12 DIAGNOSIS — E1122 Type 2 diabetes mellitus with diabetic chronic kidney disease: Secondary | ICD-10-CM | POA: Diagnosis not present

## 2022-01-12 DIAGNOSIS — E1142 Type 2 diabetes mellitus with diabetic polyneuropathy: Secondary | ICD-10-CM | POA: Insufficient documentation

## 2022-01-12 DIAGNOSIS — R0602 Shortness of breath: Secondary | ICD-10-CM | POA: Insufficient documentation

## 2022-01-12 DIAGNOSIS — I272 Pulmonary hypertension, unspecified: Secondary | ICD-10-CM | POA: Insufficient documentation

## 2022-01-12 DIAGNOSIS — I25118 Atherosclerotic heart disease of native coronary artery with other forms of angina pectoris: Secondary | ICD-10-CM

## 2022-01-12 DIAGNOSIS — N184 Chronic kidney disease, stage 4 (severe): Secondary | ICD-10-CM | POA: Diagnosis not present

## 2022-01-12 DIAGNOSIS — E854 Organ-limited amyloidosis: Secondary | ICD-10-CM | POA: Diagnosis not present

## 2022-01-12 DIAGNOSIS — Z79899 Other long term (current) drug therapy: Secondary | ICD-10-CM | POA: Insufficient documentation

## 2022-01-12 DIAGNOSIS — Z6841 Body Mass Index (BMI) 40.0 and over, adult: Secondary | ICD-10-CM | POA: Diagnosis not present

## 2022-01-12 DIAGNOSIS — Z7982 Long term (current) use of aspirin: Secondary | ICD-10-CM | POA: Diagnosis not present

## 2022-01-12 DIAGNOSIS — I5042 Chronic combined systolic (congestive) and diastolic (congestive) heart failure: Secondary | ICD-10-CM | POA: Insufficient documentation

## 2022-01-12 DIAGNOSIS — I2510 Atherosclerotic heart disease of native coronary artery without angina pectoris: Secondary | ICD-10-CM | POA: Diagnosis not present

## 2022-01-12 DIAGNOSIS — I4819 Other persistent atrial fibrillation: Secondary | ICD-10-CM | POA: Insufficient documentation

## 2022-01-12 DIAGNOSIS — Z7901 Long term (current) use of anticoagulants: Secondary | ICD-10-CM | POA: Insufficient documentation

## 2022-01-12 DIAGNOSIS — Z7984 Long term (current) use of oral hypoglycemic drugs: Secondary | ICD-10-CM | POA: Insufficient documentation

## 2022-01-12 DIAGNOSIS — R296 Repeated falls: Secondary | ICD-10-CM | POA: Insufficient documentation

## 2022-01-12 LAB — BASIC METABOLIC PANEL
Anion gap: 8 (ref 5–15)
BUN: 55 mg/dL — ABNORMAL HIGH (ref 8–23)
CO2: 30 mmol/L (ref 22–32)
Calcium: 9.1 mg/dL (ref 8.9–10.3)
Chloride: 102 mmol/L (ref 98–111)
Creatinine, Ser: 2.18 mg/dL — ABNORMAL HIGH (ref 0.44–1.00)
GFR, Estimated: 23 mL/min — ABNORMAL LOW (ref >60)
Glucose, Bld: 161 mg/dL — ABNORMAL HIGH (ref 70–99)
Potassium: 3.9 mmol/L (ref 3.5–5.1)
Sodium: 140 mmol/L (ref 135–145)

## 2022-01-12 LAB — BRAIN NATRIURETIC PEPTIDE: B Natriuretic Peptide: 420.4 pg/mL — ABNORMAL HIGH (ref 0.0–100.0)

## 2022-01-12 NOTE — Chronic Care Management (AMB) (Signed)
? ? ?Chronic Care Management ?Pharmacy Assistant  ? ?Name: Regina [name]  MRN: [id number] DOB: 06-16-1941 ? ? ?Reason for Encounter: Medication Review / Medication Coordination Call ?  ?Conditions to be addressed/monitored: ?HTN ? ? ?Recent office visits:  ?None ? ?Recent consult visits:  ?None ? ?Hospital visits:  ?None ? ?Medications: ?Outpatient Encounter Medications as of 01/12/2022  ?Medication Sig  ? acetaminophen (TYLENOL) 500 MG tablet Take 1,000 mg by mouth every 6 (six) hours as needed for mild pain.  ? Alcohol Swabs (B-D SINGLE USE SWABS REGULAR) PADS Use to test blood sugar up to 3 times daily  ? allopurinol (ZYLOPRIM) 100 MG tablet TAKE ONE TABLET BY MOUTH EVERY MORNING (Patient taking differently: Take 100 mg by mouth daily.)  ? aspirin EC 81 MG EC tablet Take 1 tablet (81 mg total) by mouth daily. Swallow whole.  ? atorvastatin (LIPITOR) 20 MG tablet TAKE ONE TABLET BY MOUTH EVERYDAY AT BEDTIME (Patient taking differently: Take 20 mg by mouth at bedtime.)  ? Blood Glucose Monitoring Suppl (ACCU-CHEK GUIDE) w/Device KIT 1 Device by Does not apply route 3 (three) times daily.  ? Capsaicin 0.033 % CREA Apply 1 application topically 3 (three) times daily.  ? Cholecalciferol (VITAMIN D3) 50 MCG (2000 UT) capsule Take 2,000 Units by mouth daily.  ? COMFORT EZ PEN NEEDLES 31G X 8 MM MISC USE 3 TO 4 times daily as directed  ? Continuous Blood Gluc Sensor (DEXCOM G6 SENSOR) MISC 1 Device by Does not apply route as directed.  ? Continuous Blood Gluc Transmit (DEXCOM G6 TRANSMITTER) MISC 1 Device by Does not apply route as directed.  ? famotidine (PEPCID) 20 MG tablet Take 1 tablet (20 mg total) by mouth at bedtime.  ? FARXIGA 10 MG TABS tablet Take 1 tablet (10 mg total) by mouth daily.  ? gabapentin (NEURONTIN) 600 MG tablet TAKE ONE TABLET BY MOUTH EVERY MORNING and TAKE ONE TABLET BY MOUTH EVERYDAY AT BEDTIME (Patient taking differently: Take 600 mg by mouth 2 (two) times daily.)  ? glucose blood  (ACCU-CHEK GUIDE) test strip Use to test blood sugar up to 3 times daily  ? insulin glargine, 1 Unit Dial, (TOUJEO SOLOSTAR) 300 UNIT/ML Solostar Pen Inject 26 Units into the skin daily. Eat a snack with protein nightly before bedtime.  ? insulin lispro (HUMALOG KWIKPEN) 100 UNIT/ML KwikPen Max daily 15 units (Patient taking differently: 0-8 Units 3 (three) times daily. Max daily 15 units. Sliding Scale: If BS>160=0 units, 161-190=1 unit, 191-220=2 units, 221-250=3 units, 251-280=4 units, 281-310=5 units, 311-340=6 units, 341-370=7 units, 371-400=8 units)  ? Insulin Pen Needle 32G X 4 MM MISC 1 Device by Does not apply route in the morning, at noon, in the evening, and at bedtime.  ? ipratropium (ATROVENT) 0.06 % nasal spray Place 2 sprays into both nostrils 4 (four) times daily.  ? Lancets Misc. (ACCU-CHEK FASTCLIX LANCET) KIT Use to test blood sugar up to 3 times daily  ? levothyroxine (SYNTHROID) 150 MCG tablet TAKE ONE TABLET BY MOUTH BEFORE BREAKFAST (Patient taking differently: Take 150 mcg by mouth daily before breakfast.)  ? magnesium oxide (MAG-OX) 400 MG tablet TAKE ONE TABLET BY MOUTH EVERY MORNING (Patient taking differently: Take 400 mg by mouth in the morning.)  ? meclizine (ANTIVERT) 25 MG tablet Take 25 mg by mouth daily as needed for dizziness.  ? metoprolol succinate (TOPROL-XL) 25 MG 24 hr tablet Take 1 tablet (25 mg total) by mouth daily. Take with or immediately following a  meal.  ? nitroGLYCERIN (NITROSTAT) 0.4 MG SL tablet Place 1 tablet under tongue for chest pain. Repeat every five minutes 2 more times if needed for a total of 3 tablets within 15 minutes.  ? pantoprazole (PROTONIX) 20 MG tablet Take 1 tablet (20 mg total) by mouth daily.  ? Polyethylene Glycol 3350 (MIRALAX PO) Take 1 Package by mouth daily as needed (constipation).  ? potassium chloride SA (KLOR-CON M20) 20 MEQ tablet Take 1 tablet (20 mEq total) by mouth daily.  ? Propylene Glycol (SYSTANE BALANCE OP) Place 1 drop into  both eyes daily as needed (for dry eyes).  ? SALINE MIST SPRAY NA Place 2 sprays into the nose at bedtime.  ? Tafamidis 61 MG CAPS Take 61 mg by mouth daily.  ? torsemide (DEMADEX) 20 MG tablet Take 2 tablets (40 mg total) by mouth daily.  ? trolamine salicylate (ASPERCREME) 10 % cream Apply 1 application. topically as needed for muscle pain.  ? warfarin (COUMADIN) 5 MG tablet TAKE 1/2 TO 1 TABLET BY MOUTH daily OR AS DIRECTED by THE coumadin clinic (Patient taking differently: Take 2.5-5 mg by mouth as directed. Take 1/2 tablet (2.5 mg) Daily Except On Sundays Take 1 tablet (5 mg) or as directed by the Coumadin clinic)  ? ?No facility-administered encounter medications on file as of 01/12/2022.  ? ?Reviewed chart for medication changes ahead of medication coordination call. ? ?No OVs, Consults, or hospital visits since last care coordination call/Pharmacist visit. (If appropriate, list visit date, provider name) ? ?No medication changes indicated OR if recent visit, treatment plan here. ? ?BP Readings from Last 3 Encounters:  ?01/04/22 128/80  ?01/04/22 110/68  ?01/04/22 124/74  ?  ?Lab Results  ?Component Value Date  ? HGBA1C 7.6 (A) 08/13/2021  ?  ? ?Patient obtains medications through Adherence Packaging  30 Days  ?  ?Last adherence delivery included: ?Gabapentin 600 mg tablet  1 tablet at breakfast and 1 tablet at dinner ?Metoprolol succinate 25 mg tablets 1 tablet at breakfast ?Allopurinol 100 mg 1 tablet at breakfast ?Atorvastatin 20 mg 1 tablet at bedtime ?Cetirizine 10 mg 1 tablet at breakfast  ?Levothyroxine 150 mcg 1 tablet before breakfast ?Farxiga 10 mg 1 tablet at breakfast ?Magnesium oxide 400 mg 1 tablet at breakfast ?Warfarin 5 mg tablet (easy top vials) use as directed ?Torsemide 20 mg 2 tablets at breakfast and as needed  ?Pen needles 8 mm x 31 g x 5/16 ?Toujeo Solostar 300 un/ml - Inject 26 Units into the skin daily ? ?Patient declined (meds) last month: ?Humalog Kwikpen 100 un/ml - max daily 15  units - patient has plenty on had left from mail order. ?ACCU CHEK test strips - use to check blood sugar 3 times daily - has plenty on hand ?  ?  ?  ?Patient is due for next adherence delivery on: 01/24/2022 ?  ?Called patient and reviewed medications and coordinated delivery. ?  ?This delivery to include: ?Gabapentin 600 mg tablet  1 tablet at breakfast and 1 tablet at dinner ?Metoprolol succinate 25 mg tablets 1 tablet at breakfast ?Allopurinol 100 mg 1 tablet at breakfast ?Atorvastatin 20 mg 1 tablet at bedtime ?Cetirizine 10 mg 1 tablet at breakfast  ?Levothyroxine 150 mcg 1 tablet before breakfast ?Farxiga 10 mg 1 tablet at breakfast ?Magnesium oxide 400 mg 1 tablet at breakfast ?Warfarin 5 mg tablet (easy top vials) use as directed ?Torsemide 20 mg 2 tablets at breakfast and as needed  ?Nitrostat 0.60m - 1  tablet every 5 min as needed ?Klor-Con 20 meq - 1 tablet at breakfast ?Protonix 20 mg - 1 tablet at breakfast ?Aspirin 81 mg - 1 tablet at breakfast ?  ?Patient will need a short fill: No short fills needed ?  ?Coordinated acute fill: No acute fills needed ?  ?Patient declined the following medications:  ?ACCU CHEK test strips - use to check blood sugar 3 times daily - has plenty on hand ?Humalog Kwikpen 100 un/ml - max daily 15 units  ?Toujeo Solostar 300 un/ml - Inject 26 Units into the skin daily ?Pen needles 8 mm x 31 g x 5/16 - pt states has plenty on hand ?Furosemide 80 mg - medication has been replaced with Torsemide. ? ?Confirmed delivery date of 01/24/2022, advised patient that pharmacy will contact them the morning of delivery. ?  ?Care Gaps: ?AWV - scheduled for 02/16/22 ?Last A1C - 7.6 on 08/13/2021 ?Last BP - 128/80 on 11/08/2021 ?Zoster vaccines - due ?[place] vaccine - overdue ?Foot exam - overdue ?Covid-19 vaccine - overdue  ?  ?Star Rating Drugs: ?Atorvastatin 38m - last filled on 11/19/2021 30DS at [facility] ?Farxiga 13m- last filled on 11/19/2021 30DS at [facility] ? ?JaGennie AlmaMA   ?Clinical Pharmacist Assistant ?[phone] ?

## 2022-01-12 NOTE — Patient Instructions (Signed)
Thank you for coming in today ? ?Labs were done today, if any labs are abnormal the clinic will call you ?No news is good news  ? ?Your physician recommends that you schedule a follow-up appointment in:  ?3-4 months with Dr. Haroldine Laws ? ?At the [facility], you and your health needs are our priority. As part of our continuing mission to provide you with exceptional heart care, we have created designated Provider Care Teams. These Care Teams include your primary Cardiologist (physician) and Advanced Practice Providers (APPs- Physician Assistants and Nurse Practitioners) who all work together to provide you with the care you need, when you need it.  ? ?You may see any of the following providers on your designated Care Team at your next follow up: ?Dr Glori Bickers ?Dr Loralie Champagne ?Darrick Grinder, NP ?Lyda Jester, PA ?Jessica Milford,NP ?Marlyce Huge, PA ?Audry Riles, PharmD ? ? ?Please be sure to bring in all your medications bottles to every appointment.  ? ?

## 2022-01-12 NOTE — Progress Notes (Signed)
? ?ADVANCED HF CLINIC NOTE ? ?Primary Care: [name], [name], MD ?Cardiologist: Dr. Audie Box ?HF Cardiologist: Dr. Haroldine Laws ? ?HPI: ?Regina [name] is a 81 y.o.Marland Kitchen female with morbid obesity, chronic systolic HF (EF 12%) likely tachy-induced, hATTR amyloidosis, persistent Afib with LAA thrombus, DM2, non-obstructive CAD (cath 2016), HTN, OSA, CKD 3/4 who is referred by Dr. Audie Box for further management of her TTR cardiac amyloidosis ?  ?Underwent cath in 4/16 for CP. Non-obstructive CAD, Echo at that time EF 55-60%. ?  ?Echo 04/03/20 EF 55-60% Grade 2 DD. RV ok  ?  ?PYP 04/16/20: Positive Grade 3. Genetic testing + for hATTR (Val142Ile mutation)  ?  ?Admitted 7/21 for  AF with RVR. EF 30-40% on TEE with LAA sludge so DC-CV deferred, HR remained high so carvedilol changed to Toprol ?  ?Underwent repeat TEE on 07/01/20 and EF 15% with persistent LAA clot. DC-CV cancelled. Myoview 08/14/20 EF 29% no ischemia or scar. ?  ?Seen in clinic 12/21-->NYHA III, volume ok. Awaiting tafamadis. Zio placed to evaluate rate control of AF for amiodarone consideration. ? ?Admitted 3/23 with CP. Found to be in a/c CHF. Diuresed with IV lasix. Underwent RHC showing elevated pressures. No LHC with CKD. Remained in AF with CVR.  ? ?Follow up with Dr. Audie Box 3/23, lasix changed to torsemide. Started process on amvuttra. ? ?Today she returns for HF follow up. We last saw her 12/21. Overall feeling better since discharge. [place] last week, went to ED and CT head negative. No SOB with ADLs, but SOB with walking on flat ground further distances with a cane. Struggles with balance and neuropathy in feet. Denies abnormal bleeding, CP, edema, or PND/Orthopnea. Appetite ok. No fever or chills. Weight at home 221 pounds. Taking all medications. Wearing CPAP at night.  ? ? ?Cardiac Studies: ?- Echo (3/23): EF 40-45%, global LV HK, mild LVH, RV ok ? ?- RHC (3/23): elevated filling pressures and pulmonary hypertension. ?1: Right atrial pressure-12/12 ?2:  Right ventricular pressure-60/3 ?3: Pulmonary artery pressure-66/28, mean 34 ?4: Pulmonary wedge pressure-A-wave equals 21, V wave equals 35, mean equals 26 ?5: Cardiac output-4.66 L/min with an index of 2.32 L/min/m? ? ?Past Medical History:  ?Diagnosis Date  ? Back pain   ? CHF (congestive heart failure) ([place])   ? 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 Chronic combined systolic and diastolic CHF (congestive heart failure) ([place])   ? Diabetes mellitus without complication ([place])   ? GERD (gastroesophageal reflux disease)   ? Heart disease   ? Hypertension   ? Hypothyroidism   ? Joint pain   ? Mini stroke   ? Non-obstructive CAD   ? a. 01/2015 Cardiolite: + inf wall ischemia, EF 56%;  b. 01/2015 Cath: LM nl, LAD 87m LCX min irregs, RCA dominant, 50-627m ? Sleep apnea   ? Swallowing difficulty   ? ?Current Outpatient Medications  ?Medication Sig Dispense Refill  ? acetaminophen (TYLENOL) 500 MG tablet Take 1,000 mg by mouth every 6 (six) hours as needed for mild pain.    ? Alcohol Swabs (B-D SINGLE USE SWABS REGULAR) PADS Use to test blood sugar up to 3 times daily 100 each 3  ? allopurinol (ZYLOPRIM) 100 MG tablet TAKE ONE TABLET BY MOUTH EVERY MORNING 90 tablet 1  ? aspirin EC 81 MG EC tablet Take 1 tablet (81 mg total) by mouth daily. Swallow whole. 30 tablet 11  ? atorvastatin (LIPITOR) 20 MG tablet TAKE ONE TABLET BY MOUTH EVERYDAY AT  BEDTIME 90 tablet 2  ? Blood Glucose Monitoring Suppl (ACCU-CHEK GUIDE) w/Device KIT 1 Device by Does not apply route 3 (three) times daily. 1 kit 0  ? Capsaicin 0.033 % CREA Apply 1 application topically 3 (three) times daily. (Patient taking differently: Apply 1 application. topically 3 (three) times daily. As needed) 56.6 g 11  ? Cholecalciferol (VITAMIN D3) 50 MCG (2000 UT) capsule Take 2,000 Units by mouth daily.    ? COMFORT EZ PEN NEEDLES 31G X 8 MM MISC USE 3 TO 4 times daily as directed 100 each 2  ? Continuous Blood Gluc Sensor (DEXCOM G6 SENSOR) MISC 1 Device by  Does not apply route as directed. 9 each 3  ? Continuous Blood Gluc Transmit (DEXCOM G6 TRANSMITTER) MISC 1 Device by Does not apply route as directed. 1 each 3  ? famotidine (PEPCID) 20 MG tablet Take 1 tablet (20 mg total) by mouth at bedtime. 90 tablet 1  ? FARXIGA 10 MG TABS tablet Take 1 tablet (10 mg total) by mouth daily. 90 tablet 3  ? gabapentin (NEURONTIN) 600 MG tablet TAKE ONE TABLET BY MOUTH EVERY MORNING and TAKE ONE TABLET BY MOUTH EVERYDAY AT BEDTIME 60 tablet 3  ? glucose blood (ACCU-CHEK GUIDE) test strip Use to test blood sugar up to 3 times daily 100 each 3  ? insulin glargine, 1 Unit Dial, (TOUJEO SOLOSTAR) 300 UNIT/ML Solostar Pen Inject 26 Units into the skin daily. Eat a snack with protein nightly before bedtime. 15 mL 6  ? insulin lispro (HUMALOG KWIKPEN) 100 UNIT/ML KwikPen Max daily 15 units (Patient taking differently: 0-8 Units 3 (three) times daily. Max daily 15 units. Sliding Scale: If BS>160=0 units, 161-190=1 unit, 191-220=2 units, 221-250=3 units, 251-280=4 units, 281-310=5 units, 311-340=6 units, 341-370=7 units, 371-400=8 units) 15 mL 11  ? Insulin Pen Needle 32G X 4 MM MISC 1 Device by Does not apply route in the morning, at noon, in the evening, and at bedtime. 150 each 6  ? ipratropium (ATROVENT) 0.06 % nasal spray Place 2 sprays into both nostrils 4 (four) times daily. (Patient taking differently: Place 2 sprays into both nostrils 4 (four) times daily. As needed) 15 mL 3  ? Lancets Misc. (ACCU-CHEK FASTCLIX LANCET) KIT Use to test blood sugar up to 3 times daily 1 kit 1  ? levothyroxine (SYNTHROID) 150 MCG tablet TAKE ONE TABLET BY MOUTH BEFORE BREAKFAST 90 tablet 1  ? magnesium oxide (MAG-OX) 400 MG tablet TAKE ONE TABLET BY MOUTH EVERY MORNING 90 tablet 1  ? meclizine (ANTIVERT) 25 MG tablet Take 25 mg by mouth daily as needed for dizziness.    ? metoprolol succinate (TOPROL-XL) 25 MG 24 hr tablet Take 1 tablet (25 mg total) by mouth daily. Take with or immediately following  a meal. 90 tablet 3  ? nitroGLYCERIN (NITROSTAT) 0.4 MG SL tablet Place 1 tablet under tongue for chest pain. Repeat every five minutes 2 more times if needed for a total of 3 tablets within 15 minutes. 25 tablet 2  ? pantoprazole (PROTONIX) 20 MG tablet Take 1 tablet (20 mg total) by mouth daily. 30 tablet 2  ? Polyethylene Glycol 3350 (MIRALAX PO) Take 1 Package by mouth daily as needed (constipation).    ? potassium chloride SA (KLOR-CON M20) 20 MEQ tablet Take 1 tablet (20 mEq total) by mouth daily. 90 tablet 3  ? Propylene Glycol (SYSTANE BALANCE OP) Place 1 drop into both eyes daily as needed (for dry eyes).    ?  SALINE MIST SPRAY NA Place 2 sprays into the nose at bedtime.    ? Tafamidis 61 MG CAPS Take 61 mg by mouth daily. 30 capsule 12  ? torsemide (DEMADEX) 20 MG tablet Take 2 tablets (40 mg total) by mouth daily. 180 tablet 3  ? trolamine salicylate (ASPERCREME) 10 % cream Apply 1 application. topically as needed for muscle pain.    ? warfarin (COUMADIN) 5 MG tablet TAKE 1/2 TO 1 TABLET BY MOUTH daily OR AS DIRECTED by THE coumadin clinic (Patient taking differently: Take 2.5-5 mg by mouth as directed. Take 1/2 tablet (2.5 mg) Daily Except On Sundays Take 1 tablet (5 mg) or as directed by the Coumadin clinic) 30 tablet 0  ? ?No current facility-administered medications for this encounter.  ? ?Allergies  ?Allergen Reactions  ? Ace Inhibitors Other (See Comments)  ?  unknown  ? Amlodipine Other (See Comments)  ?  unknown  ? Atenolol Other (See Comments)  ?  bradycardia  ? Avandia [Rosiglitazone] Other (See Comments)  ?  unknown  ? Darvon [Propoxyphene] Other (See Comments)  ?  unknown  ? Erythromycin Itching  ? Hydralazine Other (See Comments)  ?  Burning in throat and chest  ? Hydrocodone Other (See Comments)  ?  Hallucinations.  ? Levofloxacin Itching  ? Morphine And Related Other (See Comments)  ?  Dizzy and hallucianation, vomiting; ?Willing to try low dose  ? Percocet [Oxycodone-Acetaminophen] Other  (See Comments)  ?  hallucination  ? Spironolactone Other (See Comments)  ?  unknown  ? Tramadol Other (See Comments)  ?  Unknown/does not recall reaction but does not want to take again  ? ?Social His

## 2022-01-13 ENCOUNTER — Other Ambulatory Visit (HOSPITAL_COMMUNITY): Payer: Self-pay

## 2022-01-14 DIAGNOSIS — E113499 Type 2 diabetes mellitus with severe nonproliferative diabetic retinopathy without macular edema, unspecified eye: Secondary | ICD-10-CM

## 2022-01-14 DIAGNOSIS — I10 Essential (primary) hypertension: Secondary | ICD-10-CM | POA: Diagnosis not present

## 2022-01-14 DIAGNOSIS — E782 Mixed hyperlipidemia: Secondary | ICD-10-CM | POA: Diagnosis not present

## 2022-01-14 DIAGNOSIS — I509 Heart failure, unspecified: Secondary | ICD-10-CM

## 2022-01-14 DIAGNOSIS — N184 Chronic kidney disease, stage 4 (severe): Secondary | ICD-10-CM | POA: Diagnosis not present

## 2022-01-14 DIAGNOSIS — I4891 Unspecified atrial fibrillation: Secondary | ICD-10-CM | POA: Diagnosis not present

## 2022-01-14 DIAGNOSIS — I2510 Atherosclerotic heart disease of native coronary artery without angina pectoris: Secondary | ICD-10-CM | POA: Diagnosis not present

## 2022-01-17 ENCOUNTER — Other Ambulatory Visit: Payer: Self-pay | Admitting: Cardiovascular Disease

## 2022-01-17 NOTE — Telephone Encounter (Signed)
Spoke with Mardene Sayer will reach out to patient about affordabillity ?

## 2022-01-17 NOTE — Telephone Encounter (Signed)
Regina [name] called back and would like to speak with nurse about starting pt on amuttra ? ?Best number [phone] Ext [phone] ?

## 2022-01-18 ENCOUNTER — Ambulatory Visit: Payer: Medicare Other | Admitting: Neurology

## 2022-01-19 ENCOUNTER — Telehealth: Payer: Self-pay | Admitting: Cardiovascular Disease

## 2022-01-19 NOTE — Telephone Encounter (Signed)
I spoke to the pharmacy and informed her a refill for Potassium Chloride (Klor-Con) 20 MEQ was sent on 01/04/2022 to [facility]. And for her to call [facility] to have her refill to be sent to her. Patient acknowledged my statement and understood. ?

## 2022-01-19 NOTE — Telephone Encounter (Signed)
?*  STAT* If patient is at the pharmacy, call can be transferred to refill team. ? ? ?1. Which medications need to be refilled? (please list name of each medication and dose if known) potassium chloride SA (KLOR-CON M20) 20 MEQ tablet ? ?2. Which pharmacy/location (including street and city if local pharmacy) is medication to be sent to? [facility] - [place], [place] - [place] [address] ? ?3. Do they need a 30 day or 90 day supply? 90 ? ?

## 2022-01-21 ENCOUNTER — Ambulatory Visit: Payer: Medicare Other

## 2022-01-21 DIAGNOSIS — Z7901 Long term (current) use of anticoagulants: Secondary | ICD-10-CM | POA: Diagnosis not present

## 2022-01-21 LAB — POCT INR: INR: 3.5 — AB (ref 2.0–3.0)

## 2022-01-21 NOTE — Patient Instructions (Signed)
HOLD TONIGHT ONLY  and then continue taking 1/2 tablet daily except 1 tablet each Sunday.  Repeat INR in 3 weeks; [phone];  ? ? ?

## 2022-01-24 ENCOUNTER — Telehealth: Payer: Self-pay | Admitting: Cardiovascular Disease

## 2022-01-24 ENCOUNTER — Telehealth: Payer: Medicare Other

## 2022-01-24 NOTE — Telephone Encounter (Signed)
Per chart review, HF clinica started paperwork for this medication  ? ?Routed message to the nurse who covered Rudi Heap FNP during that appointment  ?

## 2022-01-24 NOTE — Telephone Encounter (Signed)
Pt c/o medication issue: ? ?1. Name of Medication: amvuttra injection  ? ?2. How are you currently taking this medication (dosage and times per day)?   ? ?3. Are you having a reaction (difficulty breathing--STAT)? no ? ?4. What is your medication issue? Was calling to bout the medication dr. Marisue Ivan want her to start taking. Patient needs help with getting it. Please advise  ? ?

## 2022-01-25 ENCOUNTER — Telehealth: Payer: Self-pay | Admitting: Cardiovascular Disease

## 2022-01-25 NOTE — Telephone Encounter (Signed)
Routed to HF clinic team to contact patient about her medication  ?

## 2022-01-25 NOTE — Telephone Encounter (Signed)
Pt states that she received a call stating that her application was denied for the Amvuttra medication. Pt is unsure of what application that they were talking about. She would like for someone to explain this to her . Please advise ?

## 2022-01-25 NOTE — Telephone Encounter (Signed)
Message routed to HF clinic triage team ?

## 2022-01-25 NOTE — Telephone Encounter (Signed)
Patient aware that [facility] LPN and Dr. Audie Box have been routed the message on this medication. Aware Almyra Free is out of the office today but will route to her for follow up ?

## 2022-01-25 NOTE — Telephone Encounter (Signed)
Message received from HF clinic pharmacy team ? ?Jodelle Gross F, CPhT  Fidel Levy, RN ?Caller: Unspecified (Today,  4:34 PM) ?[place] office placed the referral, would route information to that office.  ? ?Thanks  ?

## 2022-01-25 NOTE — Telephone Encounter (Signed)
Office called to say if the patient is okay with paying $3600 for the medication. Please advise ?

## 2022-01-25 NOTE — Telephone Encounter (Signed)
Our office did not start the [place] referral as we were informed that Dr. Kathalene Frames office has already completed that referral.

## 2022-01-26 NOTE — Telephone Encounter (Signed)
The manufacturer has sent assistance paperwork to patient (she may not have it yet - I was told this on Monday).  She needs to fill it out and bring back to the office so we can fill out anything we need to and fax it in. ?

## 2022-01-26 NOTE — Telephone Encounter (Signed)
Contacted patient, advised of message from [facility], she will be on the look out for paperwork- fill it out and bring it back to [place] to fill out anything we need too.  ?  ?Patient advised if this does not work, we will not worry about it per Dr.O'Neal.  ?  ?Patient verbalized understanding, thankful for call back. ?  ?

## 2022-01-26 NOTE — Telephone Encounter (Signed)
Contacted patient, advised of message from [facility], she will be on the look out for paperwork- fill it out and bring it back to [place] to fill out anything we need too.  ? ?Patient advised if this does not work, we will not worry about it per Dr.O'Neal.  ? ?Patient verbalized understanding, thankful for call back. ? ?

## 2022-01-31 ENCOUNTER — Encounter: Payer: Self-pay | Admitting: Internal Medicine

## 2022-01-31 ENCOUNTER — Ambulatory Visit: Payer: Medicare Other | Admitting: Internal Medicine

## 2022-01-31 VITALS — BP 136/80 | HR 106 | Ht 62.0 in | Wt 225.4 lb

## 2022-01-31 DIAGNOSIS — Z794 Long term (current) use of insulin: Secondary | ICD-10-CM

## 2022-01-31 DIAGNOSIS — E1140 Type 2 diabetes mellitus with diabetic neuropathy, unspecified: Secondary | ICD-10-CM

## 2022-01-31 DIAGNOSIS — M79674 Pain in right toe(s): Secondary | ICD-10-CM

## 2022-01-31 DIAGNOSIS — E11319 Type 2 diabetes mellitus with unspecified diabetic retinopathy without macular edema: Secondary | ICD-10-CM

## 2022-01-31 DIAGNOSIS — R739 Hyperglycemia, unspecified: Secondary | ICD-10-CM

## 2022-01-31 DIAGNOSIS — E1159 Type 2 diabetes mellitus with other circulatory complications: Secondary | ICD-10-CM | POA: Diagnosis not present

## 2022-01-31 LAB — POCT GLYCOSYLATED HEMOGLOBIN (HGB A1C): Hemoglobin A1C: 7.3 % — AB (ref 4.0–5.6)

## 2022-01-31 MED ORDER — INSULIN LISPRO (1 UNIT DIAL) 100 UNIT/ML (KWIKPEN): PEN_INJECTOR | SUBCUTANEOUS | 3 refills | Status: DC

## 2022-01-31 MED ORDER — TOUJEO SOLOSTAR 300 UNIT/ML ~~LOC~~ SOPN: 24.0000 [IU] | PEN_INJECTOR | Freq: Every day | SUBCUTANEOUS | 6 refills | Status: DC

## 2022-01-31 MED ORDER — INSULIN PEN NEEDLE 32G X 4 MM MISC: 1.0000 | Freq: Four times a day (QID) | 3 refills | Status: DC

## 2022-01-31 NOTE — Patient Instructions (Signed)
-   Decrease Toujeo  24 units daily  ?- Increase HUmalog 7 units with each meal  ?- Humalog correctional insulin: ADD extra units on insulin to your meal-time Humalog dose if your blood sugars are higher than 160. Use the scale below to help guide you:  ? ?Blood sugar before meal Number of units to inject  ?Less than 160 0 unit  ?161 -  190 1 units  ?191 -  220 2 units  ?221 -  250 3 units  ?251 -  280 4 units  ?281 -  310 5 units  ?311 -  340 6 units  ?341 -  370 7 units  ?371 -  400 8 units  ? ? ? ?HOW TO TREAT LOW BLOOD SUGARS (Blood sugar LESS THAN 70 MG/DL) ?Please follow the RULE OF 15 for the treatment of hypoglycemia treatment (when your (blood sugars are less than 70 mg/dL)  ? ?STEP 1: Take 15 grams of carbohydrates when your blood sugar is low, which includes:  ?3-4 GLUCOSE TABS  OR ?3-4 OZ OF JUICE OR REGULAR SODA OR ?ONE TUBE OF GLUCOSE GEL   ? ?STEP 2: RECHECK blood sugar in 15 MINUTES ?STEP 3: If your blood sugar is still low at the 15 minute recheck --> then, go back to STEP 1 and treat AGAIN with another 15 grams of carbohydrates. ? ?

## 2022-01-31 NOTE — Progress Notes (Signed)
?Name: Regina [name]  ?Age/ Sex: 81 y.o., female   ?MRN/ DOB: [id number], 1941-07-15    ? ?PCP: [name], [name], MD   ?Reason for Endocrinology Evaluation: Type 2 Diabetes Mellitus  ?Initial Endocrine Consultative Visit: 09/09/2020  ? ? ?PATIENT IDENTIFIER: [name] is a 81 y.o. female with a past medical history of T2DM, HTN, CAD, OSA on CPAP  and A.Fib. The patient has followed with Endocrinology clinic since 09/09/2020 for consultative assistance with management of her diabetes. ? ?DIABETIC HISTORY:  ?Regina [name] was diagnosed with DM yrs ago. Her hemoglobin A1c has ranged from 6.9% in 2021, peaking at 7.8% in 2020 ? ? ?On her initial visit to our clinic she had an A1c of 7.4% She was on basal insulin and Ozemopic, she was already out of Ozempic and we held off until more data was available about her retinopathy. We started humalog per correction scale  ? ?She was started on Farxiga by cardiology 04/2021 ?Started Trulicity  ? ?Nephrology [facility] - Dr. peoples ? ?SUBJECTIVE:  ? ?During the last visit (10/13/2021): A1c 7.6 % We continued Toujeo and humaog per CS and started Trulicity  ? ? ? ?Today (01/31/2022): Ms. Standre is here for a follow up on diabetes management.  She is accompanied by her daughter  . She checks her blood sugars multiple times a day through CGM . The patient has had hypoglycemic episodes since the last clinic visit which typically occur a night  ? ?She is off prednisone for Polymyagia rheumatica ?No nausea  vomiting or diarrhea  ? ? ?Trulicity  cost prohibitive  ?She has burning of the feet  ?She was prescribed Cymbalta but did not start due to fear of side effects ?Patient with recurrent falls ? ?HOME DIABETES REGIMEN:  ?Toujeo 26 units daily  ?Humalog 5 TIDQAM  ?CF: Humalog: ( BG-120/30) ?Farxiga 10 mg daily-through cardiology ? ? ? ?Statin: yes ?ACE-I/ARB: no ? ? ? ?CONTINUOUS GLUCOSE MONITORING RECORD INTERPRETATION   ? ?Dates of Recording: 4/4 -  01/31/2022 ? ?Sensor description:dexcom ? ?Results statistics: ?  ?CGM use % of time 100  ?Average and SD 165/44  ?Time in range   66 %  ?% Time Above 180 30  ?% Time above 250 3  ?% Time Below target <1  ? ? ? ?Glycemic patterns summary: optimal BG's at night but high during the day  ? ?Hyperglycemic episodes  postprandial mainly lunch and dinner ? ?Hypoglycemic episodes occurred n/a ? ?Overnight periods: trends down ? ? ? ?DIABETIC COMPLICATIONS: ?Microvascular complications:  ?CKD III, Retinopathy ( hx of laser )  ?Denies: neuropathy  ?Last eye exam: Completed 2021 ?  ?Macrovascular complications:  ?Non-obstructive CAD  ?Denies: PVD, CVA ?  ? ? ?HISTORY:  ?Past Medical History:  ?Past Medical History:  ?Diagnosis Date  ? Back pain   ? CHF (congestive heart failure) ([place])   ? 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 Chronic combined systolic and diastolic CHF (congestive heart failure) ([place])   ? Diabetes mellitus without complication ([place])   ? GERD (gastroesophageal reflux disease)   ? Heart disease   ? Hypertension   ? Hypothyroidism   ? Joint pain   ? Mini stroke   ? Non-obstructive CAD   ? a. 01/2015 Cardiolite: + inf wall ischemia, EF 56%;  b. 01/2015 Cath: LM nl, LAD 44m LCX min irregs, RCA dominant, 50-660m ? Sleep apnea   ? Swallowing difficulty   ? ?Past  Surgical History:  ?Past Surgical History:  ?Procedure Laterality Date  ? ABDOMINAL HYSTERECTOMY    ? BUBBLE STUDY  07/01/2020  ? Procedure: BUBBLE STUDY;  Surgeon: Elouise Munroe, MD;  Location: [place];  Service: Cardiovascular;;  ? BUBBLE STUDY  08/05/2020  ? Procedure: BUBBLE STUDY;  Surgeon: Geralynn Rile, MD;  Location: [place];  Service: Cardiovascular;;  done with definity   ? CARDIAC CATHETERIZATION    ? ESOPHAGOGASTRODUODENOSCOPY    ? a. 01/2015 EGD: patent esophagus.  ? ESOPHAGOGASTRODUODENOSCOPY N/A 01/20/2015  ? Procedure: ESOPHAGOGASTRODUODENOSCOPY (EGD);  Surgeon: Carol Ada, MD;  Location[facility] ENDOSCOPY;  Service:  Endoscopy;  Laterality: N/A;  ? HAND SURGERY    ? JOINT REPLACEMENT    ? reports history bilateral TKA and right TSA  ? LEFT HEART CATHETERIZATION WITH CORONARY ANGIOGRAM N/A 01/19/2015  ? RIGHT HEART CATH N/A 12/27/2021  ? Procedure: RIGHT HEART CATH;  Surgeon: Lorretta Harp, MD;  Location: [place] [facility];  Service: Cardiovascular;  Laterality: N/A;  ? TEE WITHOUT CARDIOVERSION  05/04/2020  ? TEE WITHOUT CARDIOVERSION N/A 05/05/2020  ? Procedure: TRANSESOPHAGEAL ECHOCARDIOGRAM (TEE);  Surgeon: Pixie Casino, MD;  Location: [facility] ENDOSCOPY;  Service: Cardiovascular;  Laterality: N/A;  ? TEE WITHOUT CARDIOVERSION N/A 07/01/2020  ? Procedure: TRANSESOPHAGEAL ECHOCARDIOGRAM (TEE);  Surgeon: Elouise Munroe, MD;  Location: [place];  Service: Cardiovascular;  Laterality: N/A;  ? TEE WITHOUT CARDIOVERSION N/A 08/05/2020  ? Procedure: TRANSESOPHAGEAL ECHOCARDIOGRAM (TEE);  Surgeon: Geralynn Rile, MD;  Location: [place];  Service: Cardiovascular;  Laterality: N/A;  ? THYROIDECTOMY    ? ?Social History:  reports that she has never smoked. She has never used smokeless tobacco. She reports that she does not drink alcohol and does not use drugs. ?Family History:  ?Family History  ?Problem Relation Age of Onset  ? Heart disease Mother   ? Hypertension Mother   ? Cancer Father   ? Alcoholism Father   ? ? ? ?HOME MEDICATIONS: ?Allergies as of 01/31/2022   ? ?   Reactions  ? Ace Inhibitors Other (See Comments)  ? unknown  ? Amlodipine Other (See Comments)  ? unknown  ? Atenolol Other (See Comments)  ? bradycardia  ? Avandia [rosiglitazone] Other (See Comments)  ? unknown  ? Darvon [propoxyphene] Other (See Comments)  ? unknown  ? Erythromycin Itching  ? Hydralazine Other (See Comments)  ? Burning in throat and chest  ? Hydrocodone Other (See Comments)  ? Hallucinations.  ? Levofloxacin Itching  ? Morphine And Related Other (See Comments)  ? Dizzy and hallucianation, vomiting; ?Willing to try low dose  ?  Percocet [oxycodone-acetaminophen] Other (See Comments)  ? hallucination  ? Spironolactone Other (See Comments)  ? unknown  ? Tramadol Other (See Comments)  ? Unknown/does not recall reaction but does not want to take again  ? ?  ? ?  ?Medication List  ?  ? ?  ? Accurate as of January 31, 2022  1:43 PM. If you have any questions, ask your nurse or doctor.  ?  ?  ? ?  ? ?Accu-Chek [employer] Kit ?Use to test blood sugar up to 3 times daily ?  ?Accu-Chek Guide test strip ?Generic drug: glucose blood ?Use to test blood sugar up to 3 times daily ?  ?Accu-Chek Guide w/Device Kit ?1 Device by Does not apply route 3 (three) times daily. ?  ?acetaminophen 500 MG tablet ?Commonly known as: TYLENOL ?Take 1,000 mg by mouth every  6 (six) hours as needed for mild pain. ?  ?allopurinol 100 MG tablet ?Commonly known as: ZYLOPRIM ?TAKE ONE TABLET BY MOUTH EVERY MORNING ?  ?aspirin 81 MG EC tablet ?Take 1 tablet (81 mg total) by mouth daily. Swallow whole. ?  ?atorvastatin 20 MG tablet ?Commonly known as: LIPITOR ?TAKE ONE TABLET BY MOUTH EVERYDAY AT BEDTIME ?  ?B-D SINGLE USE SWABS REGULAR Pads ?Use to test blood sugar up to 3 times daily ?  ?Capsaicin 0.033 % Crea ?Apply 1 application topically 3 (three) times daily. ?What changed: additional instructions ?  ?Dexcom G6 Sensor Misc ?1 Device by Does not apply route as directed. ?  ?Dexcom G6 Transmitter Misc ?1 Device by Does not apply route as directed. ?  ?famotidine 20 MG tablet ?Commonly known as: PEPCID ?Take 1 tablet (20 mg total) by mouth at bedtime. ?  ?Farxiga 10 MG Tabs tablet ?Generic drug: dapagliflozin propanediol ?Take 1 tablet (10 mg total) by mouth daily. ?  ?gabapentin 600 MG tablet ?Commonly known as: NEURONTIN ?TAKE ONE TABLET BY MOUTH EVERY MORNING and TAKE ONE TABLET BY MOUTH EVERYDAY AT BEDTIME ?  ?insulin lispro 100 UNIT/ML KwikPen ?Commonly known as: HumaLOG KwikPen ?Max daily 15 units ?What changed:  ?how much to take ?when to take this ?additional  instructions ?  ?Insulin Pen Needle 32G X 4 MM Misc ?1 Device by Does not apply route in the morning, at noon, in the evening, and at bedtime. ?  ?Comfort EZ Pen Needles 31G X 8 MM Misc ?Generic drug: Insulin Pen Needle ?

## 2022-02-01 ENCOUNTER — Encounter: Payer: Self-pay | Admitting: Internal Medicine

## 2022-02-02 ENCOUNTER — Telehealth: Payer: Self-pay | Admitting: Cardiovascular Disease

## 2022-02-02 NOTE — Telephone Encounter (Signed)
Contacted patient back.  ?She states she was contacted by the case manger of the medication, and they are sending her more paperwork to fill out. Patient aware if this seems like to much not to be concerned with doing it. Patient will try this one more time, and if it does not work she will let us know.  ? ?Patient aware to call back if any issues/concerns.  ? ?

## 2022-02-02 NOTE — Telephone Encounter (Signed)
Patient calling in regard the new medication Dr. Marisue Ivan is trying to get for her. Please advise  ?

## 2022-02-02 NOTE — Telephone Encounter (Signed)
Routed to [place] to assist  ?

## 2022-02-04 ENCOUNTER — Ambulatory Visit (INDEPENDENT_AMBULATORY_CARE_PROVIDER_SITE_OTHER): Payer: Medicare Other

## 2022-02-04 DIAGNOSIS — N184 Chronic kidney disease, stage 4 (severe): Secondary | ICD-10-CM

## 2022-02-04 DIAGNOSIS — E113499 Type 2 diabetes mellitus with severe nonproliferative diabetic retinopathy without macular edema, unspecified eye: Secondary | ICD-10-CM

## 2022-02-04 DIAGNOSIS — I4891 Unspecified atrial fibrillation: Secondary | ICD-10-CM

## 2022-02-04 DIAGNOSIS — I509 Heart failure, unspecified: Secondary | ICD-10-CM

## 2022-02-04 DIAGNOSIS — I2510 Atherosclerotic heart disease of native coronary artery without angina pectoris: Secondary | ICD-10-CM

## 2022-02-04 DIAGNOSIS — I10 Essential (primary) hypertension: Secondary | ICD-10-CM

## 2022-02-04 NOTE — Patient Instructions (Signed)
Visit Information ? ?Thank you for taking time to visit with me today. Please don't hesitate to contact me if I can be of assistance to you before our next scheduled telephone appointment. ? ?Following are the goals we discussed today:  ?Take all medications as prescribed ?Attend all scheduled provider appointments ?Call pharmacy for medication refills 3-7 days in advance of running out of medications ?Call provider office for new concerns or questions  ?call office if I gain more than 2 pounds in one day or 5 pounds in one week ?keep legs up while sitting ?track weight in diary ?weigh myself daily ?follow rescue plan if symptoms flare-up ?eat more whole grains, fruits and vegetables, lean meats and healthy fats ?keep appointment with eye doctor ?check blood sugar at prescribed times: three times daily and when you have symptoms of low or high blood sugar ?fill half of plate with vegetables ?manage portion size ?Try eating protein with snacks ?check pulse (heart) rate once a day ?make a plan to eat healthy ?keep all lab appointments ?take medicine as prescribed ?check blood pressure daily ?choose a place to take my blood pressure (home, clinic or office, retail store) ?keep a blood pressure log ?take blood pressure log to all doctor appointments ?call for medicine refill 2 or 3 days before it runs out ?take all medications exactly as prescribed ?call doctor with any symptoms you believe are related to your medicine ?Hypoglycemia ?Hypoglycemia is when the sugar (glucose) level in your blood is too low. Low blood sugar can happen to people who have diabetes and people who do not have diabetes. Low blood sugar can happen quickly, and it can be an emergency. ?What are the causes? ?This condition happens most often in people who have diabetes. It may be caused by: ?Diabetes medicine. ?Not eating enough, or not eating often enough. ?Doing more physical activity. ?Drinking alcohol on an empty stomach. ?If you do not have  diabetes, this condition may be caused by: ?A tumor in the pancreas. ?Not eating enough, or not eating for long periods at a time (fasting). ?A very bad infection or illness. ?Problems after having weight loss (bariatric) surgery. ?Kidney failure or liver failure. ?Certain medicines. ?What increases the risk? ?This condition is more likely to develop in people who: ?Have diabetes and take medicines to lower their blood sugar. ?Abuse alcohol. ?Have a very bad illness. ?What are the signs or symptoms? ?Mild ?Hunger. ?Sweating and feeling clammy. ?Feeling dizzy or light-headed. ?Being sleepy or having trouble sleeping. ?Feeling like you may vomit (nauseous). ?A fast heartbeat. ?A headache. ?Blurry vision. ?Mood changes, such as: ?Being grouchy. ?Feeling worried or nervous (anxious). ?Tingling or loss of feeling (numbness) around your mouth, lips, or tongue. ?Moderate ?Confusion and poor judgment. ?Behavior changes. ?Weakness. ?Uneven heartbeat. ?Trouble with moving (coordination). ?Very low ?Very low blood sugar (severe hypoglycemia) is a medical emergency. It can cause: ?Fainting. ?Seizures. ?Loss of consciousness (coma). ?Death. ?How is this treated? ?Treating low blood sugar ?Low blood sugar is often treated by eating or drinking something that has sugar in it right away. The food or drink should contain 15 grams of a fast-acting carb (carbohydrate). Options include: ?4 oz (120 mL) of fruit juice. ?4 oz (120 mL) of regular soda (not diet soda). ?A few pieces of hard candy. Check food labels to see how many pieces to eat for 15 grams. ?1 Tbsp (15 mL) of sugar or honey. ?4 glucose tablets. ?1 tube of glucose gel. ?Treating low blood sugar if  you have diabetes ?If you can think clearly and swallow safely, follow the 15:15 rule: ?Take 15 grams of a fast-acting carb. Talk with your doctor about how much you should take. ?Always keep a source of fast-acting carb with you, such as: ?Glucose tablets (take 4 tablets). ?A few  pieces of hard candy. Check food labels to see how many pieces to eat for 15 grams. ?4 oz (120 mL) of fruit juice. ?4 oz (120 mL) of regular soda (not diet soda). ?1 Tbsp (15 mL) of honey or sugar. ?1 tube of glucose gel. ?Check your blood sugar 15 minutes after you take the carb. ?If your blood sugar is still at or below 70 mg/dL (3.9 mmol/L), take 15 grams of a carb again. ?If your blood sugar does not go above 70 mg/dL (3.9 mmol/L) after 3 tries, get help right away. ?After your blood sugar goes back to normal, eat a meal or a snack within 1 hour. ? ?Treating very low blood sugar ?If your blood sugar is below 54 mg/dL (3 mmol/L), you have very low blood sugar, or severe hypoglycemia. This is an emergency. Get medical help right away. ?If you have very low blood sugar and you cannot eat or drink, you will need to be given a hormone called glucagon. A family member or friend should learn how to check your blood sugar and how to give you glucagon. Ask your doctor if you need to have an emergency glucagon kit at home. ?Very low blood sugar may also need to be treated in a hospital. ?Follow these instructions at home: ?General instructions ?Take over-the-counter and prescription medicines only as told by your doctor. ?Stay aware of your blood sugar as told by your doctor. ?If you drink alcohol: ?Limit how much you have to: ?0-1 drink a day for women who are not pregnant. ?0-2 drinks a day for men. ?Know how much alcohol is in your drink. In the [place] one drink equals one 12 oz bottle of beer (355 mL), one 5 oz glass of wine (148 mL), or one 1? oz glass of hard liquor (44 mL). ?Be sure to eat food when you drink alcohol. ?Know that your body absorbs alcohol quickly. This may lead to low blood sugar later. Be sure to keep checking your blood sugar. ?Keep all follow-up visits. ?If you have diabetes: ? ?Always have a fast-acting carb (15 grams) with you to treat low blood sugar. ?Follow your diabetes care plan as told by  your doctor. Make sure you: ?Know the symptoms of low blood sugar. ?Check your blood sugar as often as told. Always check it before and after exercise. ?Always check your blood sugar before you drive. ?Take your medicines as told. ?Follow your meal plan. ?Eat on time. Do not skip meals. ?Share your diabetes care plan with: ?Your work or school. ?People you live with. ?Carry a card or wear jewelry that says you have diabetes. ?Where to find more information ?American Diabetes Association: www.diabetes.org ?Contact a doctor if: ?You have trouble keeping your blood sugar in your target range. ?You have low blood sugar often. ?Get help right away if: ?You still have symptoms after you eat or drink something that contains 15 grams of fast-acting carb, and you cannot get your blood sugar above 70 mg/dL by following the 15:15 rule. ?Your blood sugar is below 54 mg/dL (3 mmol/L). ?You have a seizure. ?You faint. ?These symptoms may be an emergency. Get help right away. Call your local emergency services (  911 in the [place] ?Do not wait to see if the symptoms will go away. ?Do not drive yourself to the hospital. ?Summary ?Hypoglycemia happens when the level of sugar (glucose) in your blood is too low. ?Low blood sugar can happen to people who have diabetes and people who do not have diabetes. Low blood sugar can happen quickly, and it can be an emergency. ?Make sure you know the symptoms of low blood sugar and know how to treat it. ?Always keep a source of sugar (fast-acting carb) with you to treat low blood sugar. ?This information is not intended to replace advice given to you by your health care provider. Make sure you discuss any questions you have with your health care provider. ?Document Revised: 09/03/2020 Document Reviewed: 09/03/2020 ?Elsevier Patient Education ? [place]. ? ?Our next appointment is by telephone on 03/17/22 at 10 AM ? ?Please call the care guide team at [phone] if you need to cancel or  reschedule your appointment.  ? ?If you are experiencing a Mental Health or [place] or need someone to talk to, please call the Suicide and Crisis Lifeline: 988 ?call the [place] [employer]

## 2022-02-04 NOTE — Chronic Care Management (AMB) (Signed)
?Chronic Care Management  ? ?CCM RN Visit Note ? ?02/04/2022 ?Name: Regina [name] MRN: [id number] DOB: Sep 14, 1941 ? ?Subjective: ?Regina [name] is a 81 y.o. year old female who is a primary care patient of [name], Malka So, MD. The care management team was consulted for assistance with disease management and care coordination needs.   ? ?Engaged with patient by telephone for follow up visit in response to provider referral for case management and/or care coordination services.  ? ?Consent to Services:  ?The patient was given information about Chronic Care Management services, agreed to services, and gave verbal consent prior to initiation of services.  Please see initial visit note for detailed documentation.  ? ?Patient agreed to services and verbal consent obtained.  ? ?Assessment: Review of patient past medical history, allergies, medications, health status, including review of consultants reports, laboratory and other test data, was performed as part of comprehensive evaluation and provision of chronic care management services.  ? ?SDOH (Social Determinants of Health) assessments and interventions performed:   ? ?CCM Care Plan ? ?Allergies  ?Allergen Reactions  ? Ace Inhibitors Other (See Comments)  ?  unknown  ? Amlodipine Other (See Comments)  ?  unknown  ? Atenolol Other (See Comments)  ?  bradycardia  ? Avandia [Rosiglitazone] Other (See Comments)  ?  unknown  ? Darvon [Propoxyphene] Other (See Comments)  ?  unknown  ? Erythromycin Itching  ? Hydralazine Other (See Comments)  ?  Burning in throat and chest  ? Hydrocodone Other (See Comments)  ?  Hallucinations.  ? Levofloxacin Itching  ? Morphine And Related Other (See Comments)  ?  Dizzy and hallucianation, vomiting; ?Willing to try low dose  ? Percocet [Oxycodone-Acetaminophen] Other (See Comments)  ?  hallucination  ? Spironolactone Other (See Comments)  ?  unknown  ? Tramadol Other (See Comments)  ?  Unknown/does not recall reaction but does not  want to take again  ? ? ?Outpatient Encounter Medications as of 02/04/2022  ?Medication Sig  ? acetaminophen (TYLENOL) 500 MG tablet Take 1,000 mg by mouth every 6 (six) hours as needed for mild pain.  ? Alcohol Swabs (B-D SINGLE USE SWABS REGULAR) PADS Use to test blood sugar up to 3 times daily  ? allopurinol (ZYLOPRIM) 100 MG tablet TAKE ONE TABLET BY MOUTH EVERY MORNING  ? aspirin EC 81 MG EC tablet Take 1 tablet (81 mg total) by mouth daily. Swallow whole.  ? atorvastatin (LIPITOR) 20 MG tablet TAKE ONE TABLET BY MOUTH EVERYDAY AT BEDTIME  ? Blood Glucose Monitoring Suppl (ACCU-CHEK GUIDE) w/Device KIT 1 Device by Does not apply route 3 (three) times daily.  ? Capsaicin 0.033 % CREA Apply 1 application topically 3 (three) times daily. (Patient taking differently: Apply 1 application. topically 3 (three) times daily. As needed)  ? Cholecalciferol (VITAMIN D3) 50 MCG (2000 UT) capsule Take 2,000 Units by mouth daily.  ? Continuous Blood Gluc Sensor (DEXCOM G6 SENSOR) MISC 1 Device by Does not apply route as directed.  ? Continuous Blood Gluc Transmit (DEXCOM G6 TRANSMITTER) MISC 1 Device by Does not apply route as directed.  ? famotidine (PEPCID) 20 MG tablet Take 1 tablet (20 mg total) by mouth at bedtime.  ? FARXIGA 10 MG TABS tablet Take 1 tablet (10 mg total) by mouth daily.  ? gabapentin (NEURONTIN) 600 MG tablet TAKE ONE TABLET BY MOUTH EVERY MORNING and TAKE ONE TABLET BY MOUTH EVERYDAY AT BEDTIME  ? glucose blood (ACCU-CHEK GUIDE) test  strip Use to test blood sugar up to 3 times daily  ? insulin glargine, 1 Unit Dial, (TOUJEO SOLOSTAR) 300 UNIT/ML Solostar Pen Inject 24 Units into the skin daily. Eat a snack with protein nightly before bedtime.  ? insulin lispro (HUMALOG KWIKPEN) 100 UNIT/ML KwikPen Max daily 30 units  ? Insulin Pen Needle 32G X 4 MM MISC 1 Device by Does not apply route in the morning, at noon, in the evening, and at bedtime.  ? ipratropium (ATROVENT) 0.06 % nasal spray Place 2 sprays  into both nostrils 4 (four) times daily. (Patient taking differently: Place 2 sprays into both nostrils 4 (four) times daily. As needed)  ? Lancets Misc. (ACCU-CHEK FASTCLIX LANCET) KIT Use to test blood sugar up to 3 times daily  ? levothyroxine (SYNTHROID) 150 MCG tablet TAKE ONE TABLET BY MOUTH BEFORE BREAKFAST  ? magnesium oxide (MAG-OX) 400 MG tablet TAKE ONE TABLET BY MOUTH EVERY MORNING  ? meclizine (ANTIVERT) 25 MG tablet Take 25 mg by mouth daily as needed for dizziness.  ? metoprolol succinate (TOPROL-XL) 25 MG 24 hr tablet Take 1 tablet (25 mg total) by mouth daily. Take with or immediately following a meal.  ? nitroGLYCERIN (NITROSTAT) 0.4 MG SL tablet Place 1 tablet under tongue for chest pain. Repeat every five minutes 2 more times if needed for a total of 3 tablets within 15 minutes.  ? pantoprazole (PROTONIX) 20 MG tablet Take 1 tablet (20 mg total) by mouth daily.  ? Polyethylene Glycol 3350 (MIRALAX PO) Take 1 Package by mouth daily as needed (constipation).  ? potassium chloride SA (KLOR-CON M20) 20 MEQ tablet Take 1 tablet (20 mEq total) by mouth daily.  ? Propylene Glycol (SYSTANE BALANCE OP) Place 1 drop into both eyes daily as needed (for dry eyes).  ? SALINE MIST SPRAY NA Place 2 sprays into the nose at bedtime.  ? Tafamidis 61 MG CAPS Take 61 mg by mouth daily.  ? torsemide (DEMADEX) 20 MG tablet Take 2 tablets (40 mg total) by mouth daily.  ? trolamine salicylate (ASPERCREME) 10 % cream Apply 1 application. topically as needed for muscle pain.  ? warfarin (COUMADIN) 5 MG tablet TAKE 1/2 TO 1 TABLET BY MOUTH daily OR AS DIRECTED by THE coumadin clinic  ? ?No facility-administered encounter medications on file as of 02/04/2022.  ? ? ?Patient Active Problem List  ? Diagnosis Date Noted  ? Chest pain 12/26/2021  ? Left atrial thrombus 12/25/2021  ? Wild-type transthyretin-related (ATTR) amyloidosis ([place])   ? Lung nodules 12/13/2021  ? Polymyalgia rheumatica ([place]) 06/30/2021  ? Neck pain  06/18/2021  ? Gait abnormality 06/18/2021  ? Memory loss 06/18/2021  ? Atherosclerosis of aorta ([place]) 06/09/2021  ? Type 2 diabetes mellitus with stage 3a chronic kidney disease, with long-term current use of insulin ([place]) 09/14/2020  ? Type 2 diabetes mellitus with retinopathy, with long-term current use of insulin ([place]) 09/14/2020  ? Diabetes mellitus ([place]) 09/14/2020  ? Type 2 diabetes mellitus with diabetic polyneuropathy, with long-term current use of insulin ([place]) 09/14/2020  ? Long term (current) use of anticoagulants 07/13/2020  ? Persistent atrial fibrillation ([place])   ? Mixed hyperlipidemia   ? Acute on chronic diastolic CHF (congestive heart failure), NYHA class 3 ([place])   ? Demand ischemia ([place])   ? Atrial fibrillation ([place]) 04/24/2020  ? Other fatigue 03/24/2020  ? Short of breath on exertion 03/24/2020  ? Congestive heart failure ([place]) 03/24/2020  ? Vitamin D deficiency 03/24/2020  ?  Sleep apnea 05/21/2019  ? History of hepatitis C 05/21/2019  ? Insomnia 05/21/2019  ? CKD (chronic kidney disease) stage 4, GFR 15-29 ml/min (HCC) 05/21/2019  ? GERD (gastroesophageal reflux disease) 04/20/2018  ? Type 2 diabetes mellitus with diabetic neuropathy, unspecified ([place]) 12/19/2016  ? History of TIA (transient ischemic attack) 12/19/2016  ? Hyperlipidemia associated with type 2 diabetes mellitus ([place]) 12/19/2016  ? S/P total knee replacement using cement, right 03/18/2015  ? Non-obstructive CAD   ? Cardiovascular stress test abnormal   ? Pleuritic chest pain 01/16/2015  ? Acute renal failure superimposed on stage 3 chronic kidney disease ([place]) 01/16/2015  ? Obesity, Class III, BMI 40-49.9 (morbid obesity) ([place]) 01/16/2015  ? Essential hypertension 01/16/2015  ? Hypothyroidism 01/16/2015  ? OSA on CPAP 01/16/2015  ? DM type 2 (diabetes mellitus, type 2) ([place]) 01/16/2015  ? Coronary artery disease involving native coronary artery without angina pectoris 10/02/2014  ? Physical deconditioning 02/28/2013  ? Morbid  obesity ([place]) 02/27/2013  ? Osteoarthritis 02/27/2013  ? ? ?Conditions to be addressed/monitored:Atrial Fibrillation, CHF, CAD, HTN, HLD, DMII, and CKD Stage 4 ? ?Care Plan : RN Care Manager Plan of Care  ?Updates mad

## 2022-02-09 ENCOUNTER — Ambulatory Visit: Payer: Medicare Other | Admitting: Podiatry

## 2022-02-10 ENCOUNTER — Other Ambulatory Visit: Payer: Self-pay | Admitting: Family Medicine

## 2022-02-10 ENCOUNTER — Telehealth: Payer: Self-pay | Admitting: Pharmacist

## 2022-02-10 ENCOUNTER — Other Ambulatory Visit: Payer: Self-pay | Admitting: Cardiovascular Disease

## 2022-02-10 NOTE — Chronic Care Management (AMB) (Signed)
Chronic Care Management Pharmacy Assistant   Name: Regina [name]  MRN: [id number] DOB: 1940/12/27  Reason for Encounter: Medication Review / Medication Coordination Call   Conditions to be addressed/monitored: HTN  Recent office visits:  None  Recent consult visits:  01/31/2022 Collier Flowers MD (endocrinology) - Patient was seen for hyperglycemia and additional issues. Referral to Podiatry. Decreased Toujeo to 24 units at bedtime. Increased Humalog Kwikpen 100 un/ml to max daily 30 units. Follow up in 6 months.  Hospital visits:  None  Medications: Outpatient Encounter Medications as of 02/10/2022  Medication Sig   acetaminophen (TYLENOL) 500 MG tablet Take 1,000 mg by mouth every 6 (six) hours as needed for mild pain.   Alcohol Swabs (B-D SINGLE USE SWABS REGULAR) PADS Use to test blood sugar up to 3 times daily   allopurinol (ZYLOPRIM) 100 MG tablet TAKE ONE TABLET BY MOUTH EVERY MORNING   aspirin EC 81 MG EC tablet Take 1 tablet (81 mg total) by mouth daily. Swallow whole.   atorvastatin (LIPITOR) 20 MG tablet TAKE ONE TABLET BY MOUTH EVERYDAY AT BEDTIME   Blood Glucose Monitoring Suppl (ACCU-CHEK GUIDE) w/Device KIT 1 Device by Does not apply route 3 (three) times daily.   Capsaicin 0.033 % CREA Apply 1 application topically 3 (three) times daily. (Patient taking differently: Apply 1 application. topically 3 (three) times daily. As needed)   Cholecalciferol (VITAMIN D3) 50 MCG (2000 UT) capsule Take 2,000 Units by mouth daily.   Continuous Blood Gluc Sensor (DEXCOM G6 SENSOR) MISC 1 Device by Does not apply route as directed.   Continuous Blood Gluc Transmit (DEXCOM G6 TRANSMITTER) MISC 1 Device by Does not apply route as directed.   famotidine (PEPCID) 20 MG tablet Take 1 tablet (20 mg total) by mouth at bedtime.   FARXIGA 10 MG TABS tablet Take 1 tablet (10 mg total) by mouth daily.   gabapentin (NEURONTIN) 600 MG tablet TAKE ONE TABLET BY MOUTH EVERY MORNING and  TAKE ONE TABLET BY MOUTH EVERYDAY AT BEDTIME   glucose blood (ACCU-CHEK GUIDE) test strip Use to test blood sugar up to 3 times daily   insulin glargine, 1 Unit Dial, (TOUJEO SOLOSTAR) 300 UNIT/ML Solostar Pen Inject 24 Units into the skin daily. Eat a snack with protein nightly before bedtime.   insulin lispro (HUMALOG KWIKPEN) 100 UNIT/ML KwikPen Max daily 30 units   Insulin Pen Needle 32G X 4 MM MISC 1 Device by Does not apply route in the morning, at noon, in the evening, and at bedtime.   ipratropium (ATROVENT) 0.06 % nasal spray Place 2 sprays into both nostrils 4 (four) times daily. (Patient taking differently: Place 2 sprays into both nostrils 4 (four) times daily. As needed)   Lancets Misc. (ACCU-CHEK FASTCLIX LANCET) KIT Use to test blood sugar up to 3 times daily   levothyroxine (SYNTHROID) 150 MCG tablet TAKE ONE TABLET BY MOUTH BEFORE BREAKFAST   magnesium oxide (MAG-OX) 400 MG tablet TAKE ONE TABLET BY MOUTH EVERY MORNING   meclizine (ANTIVERT) 25 MG tablet Take 25 mg by mouth daily as needed for dizziness.   metoprolol succinate (TOPROL-XL) 25 MG 24 hr tablet Take 1 tablet (25 mg total) by mouth daily. Take with or immediately following a meal.   nitroGLYCERIN (NITROSTAT) 0.4 MG SL tablet Place 1 tablet under tongue for chest pain. Repeat every five minutes 2 more times if needed for a total of 3 tablets within 15 minutes.   pantoprazole (PROTONIX) 20 MG tablet  Take 1 tablet (20 mg total) by mouth daily.   Polyethylene Glycol 3350 (MIRALAX PO) Take 1 Package by mouth daily as needed (constipation).   potassium chloride SA (KLOR-CON M20) 20 MEQ tablet Take 1 tablet (20 mEq total) by mouth daily.   Propylene Glycol (SYSTANE BALANCE OP) Place 1 drop into both eyes daily as needed (for dry eyes).   SALINE MIST SPRAY NA Place 2 sprays into the nose at bedtime.   Tafamidis 61 MG CAPS Take 61 mg by mouth daily.   torsemide (DEMADEX) 20 MG tablet Take 2 tablets (40 mg total) by mouth daily.    trolamine salicylate (ASPERCREME) 10 % cream Apply 1 application. topically as needed for muscle pain.   warfarin (COUMADIN) 5 MG tablet TAKE 1/2 TO 1 TABLET BY MOUTH daily OR AS DIRECTED by THE coumadin clinic   No facility-administered encounter medications on file as of 02/10/2022.  Reviewed chart for medication changes ahead of medication coordination call.  No OVs, Consults, or hospital visits since last care coordination call/Pharmacist visit. (If appropriate, list visit date, provider name)  No medication changes indicated OR if recent visit, treatment plan here.  BP Readings from Last 3 Encounters:  01/31/22 136/80  01/12/22 90/62  01/04/22 128/80    Lab Results  Component Value Date   HGBA1C 7.3 (A) 01/31/2022     Patient obtains medications through Adherence Packaging  30 Days    Last adherence delivery included: Gabapentin 600 mg tablet  1 tablet at breakfast and 1 tablet at dinner Metoprolol succinate 25 mg tablets 1 tablet at breakfast Allopurinol 100 mg 1 tablet at breakfast Atorvastatin 20 mg 1 tablet at bedtime Cetirizine 10 mg 1 tablet at breakfast  Levothyroxine 150 mcg 1 tablet before breakfast Farxiga 10 mg 1 tablet at breakfast Magnesium oxide 400 mg 1 tablet at breakfast Warfarin 5 mg tablet (easy top vials) use as directed Torsemide 20 mg 2 tablets at breakfast and as needed  Nitrostat 0.59m - 1 tablet every 5 min as needed Klor-Con 20 meq - 1 tablet at breakfast Protonix 20 mg - 1 tablet at breakfast Aspirin 81 mg - 1 tablet at breakfast   Patient declined (meds) last month: ACCU CHEK test strips - use to check blood sugar 3 times daily - has plenty on hand Humalog Kwikpen 100 un/ml - max daily 15 units  Toujeo Solostar 300 un/ml - Inject 26 Units into the skin daily Pen needles 8 mm x 31 g x 5/16 - pt states has plenty on hand Furosemide 80 mg - medication has been replaced with Torsemide.       Patient is due for next adherence delivery on:  02/22/2022   Called patient and reviewed medications and coordinated delivery.   This delivery to include: Gabapentin 600 mg tablet  1 tablet at breakfast and 1 tablet at dinner Metoprolol succinate 25 mg tablets 1 tablet at breakfast Allopurinol 100 mg 1 tablet at breakfast Atorvastatin 20 mg 1 tablet at bedtime Cetirizine 10 mg 1 tablet at breakfast (OTC med) Levothyroxine 150 mcg 1 tablet before breakfast Farxiga 10 mg 1 tablet at breakfast Magnesium oxide 400 mg 1 tablet at breakfast Warfarin 5 mg tablet (easy top vials) use as directed Torsemide 20 mg 2 tablets at breakfast and as needed  Nitrostat 0.428m- 1 tablet every 5 min as needed Klor-Con 20 meq - 1 tablet at breakfast Protonix 20 mg - 1 tablet at breakfast Aspirin 81 mg - 1 tablet at breakfast  Verify if needed  ACCU CHEK test strips - use to check blood sugar 3 times daily - has plenty on hand Humalog Kwikpen 100 un/ml - max daily 30 units  Toujeo Solostar 300 un/ml - Inject 24 Units into the skin at bedtime, eat protein snack prior. Pen needles 8 mm x 31 g x 5/16 - pt states has plenty on hand  Patient will need a short fill: No short fills needed   Coordinated acute fill:    Patient declined the following medications:     Unable to reach patient to confirm delivery date of 02/22/2022 after several attempts. CCM deadline 02/14/2022.  Care Gaps: AWV - scheduled for 02/16/22 Last A1C - 7.3 on 01/31/2022 Last BP - 136/80 on 01/31/2022 Shingrix - overdue Covid-19 vaccine - overdue    Star Rating Drugs: Atorvastatin 41m - last [facility]/20[id number]- [phone]

## 2022-02-11 ENCOUNTER — Encounter: Payer: Self-pay | Admitting: Podiatry

## 2022-02-11 ENCOUNTER — Ambulatory Visit: Payer: Medicare Other | Admitting: Podiatry

## 2022-02-11 ENCOUNTER — Ambulatory Visit (INDEPENDENT_AMBULATORY_CARE_PROVIDER_SITE_OTHER): Payer: Medicare Other

## 2022-02-11 DIAGNOSIS — B351 Tinea unguium: Secondary | ICD-10-CM

## 2022-02-11 DIAGNOSIS — M79675 Pain in left toe(s): Secondary | ICD-10-CM | POA: Diagnosis not present

## 2022-02-11 DIAGNOSIS — Z7901 Long term (current) use of anticoagulants: Secondary | ICD-10-CM

## 2022-02-11 DIAGNOSIS — N184 Chronic kidney disease, stage 4 (severe): Secondary | ICD-10-CM

## 2022-02-11 DIAGNOSIS — I4819 Other persistent atrial fibrillation: Secondary | ICD-10-CM

## 2022-02-11 DIAGNOSIS — E113499 Type 2 diabetes mellitus with severe nonproliferative diabetic retinopathy without macular edema, unspecified eye: Secondary | ICD-10-CM | POA: Diagnosis not present

## 2022-02-11 LAB — POCT INR: INR: 3.4 — AB (ref 2.0–3.0)

## 2022-02-11 NOTE — Progress Notes (Signed)
This patient presents to the office with chief complaint of long thick painful nail which is painful left big toe..  Patient says the nail is  painful walking and wearing shoes.  This patient is unable to self treat.  This patient is unable to trim her nails since she is unable to reach her nails. She presents to the office with her daughter. She presents to the office for preventative foot care services. ? ?General Appearance  Alert, conversant and in no acute stress. ? ?Vascular  Dorsalis pedis and posterior tibial  pulses are  weakly palpable  bilaterally.  Capillary return is within normal limits  bilaterally. Temperature is within normal limits  bilaterally. ? ?Neurologic  Senn-Weinstein monofilament wire test within normal limits  bilaterally. Muscle power within normal limits bilaterally. ? ?Nails Thick disfigured discolored nails with subungual debris  from hallux to fifth toes bilaterally. No evidence of bacterial infection or drainage bilaterally. ? ?Orthopedic  No limitations of motion  feet .  No crepitus or effusions noted.  No bony pathology or digital deformities noted.  Palpable pain in arch both feet. ? ?Skin  normotropic skin with no porokeratosis noted bilaterally.  No signs of infections or ulcers noted.    ? ?Onychomycosis  Nails  B/L.  Pain in right toes  Pain in left toes  Plantar fasciitis  B/L. ? ?Debridement of nails both feet followed trimming the nails with dremel tool.  Prescribed powerstep insoles  B/L.  RTC 3 months. ? ? ?[name] DPM   ?

## 2022-02-11 NOTE — Patient Instructions (Signed)
HOLD TONIGHT ONLY  and then decrease to 1/2 tablet daily.  Repeat INR in 3 weeks; [phone];  ? ?

## 2022-02-13 DIAGNOSIS — I509 Heart failure, unspecified: Secondary | ICD-10-CM | POA: Diagnosis not present

## 2022-02-13 DIAGNOSIS — Z794 Long term (current) use of insulin: Secondary | ICD-10-CM

## 2022-02-13 DIAGNOSIS — I2510 Atherosclerotic heart disease of native coronary artery without angina pectoris: Secondary | ICD-10-CM

## 2022-02-13 DIAGNOSIS — E785 Hyperlipidemia, unspecified: Secondary | ICD-10-CM | POA: Diagnosis not present

## 2022-02-13 DIAGNOSIS — N184 Chronic kidney disease, stage 4 (severe): Secondary | ICD-10-CM | POA: Diagnosis not present

## 2022-02-13 DIAGNOSIS — I130 Hypertensive heart and chronic kidney disease with heart failure and stage 1 through stage 4 chronic kidney disease, or unspecified chronic kidney disease: Secondary | ICD-10-CM | POA: Diagnosis not present

## 2022-02-13 DIAGNOSIS — I4891 Unspecified atrial fibrillation: Secondary | ICD-10-CM | POA: Diagnosis not present

## 2022-02-13 DIAGNOSIS — E1159 Type 2 diabetes mellitus with other circulatory complications: Secondary | ICD-10-CM | POA: Diagnosis not present

## 2022-02-13 DIAGNOSIS — E1122 Type 2 diabetes mellitus with diabetic chronic kidney disease: Secondary | ICD-10-CM

## 2022-02-16 ENCOUNTER — Ambulatory Visit (INDEPENDENT_AMBULATORY_CARE_PROVIDER_SITE_OTHER): Payer: Medicare Other

## 2022-02-16 ENCOUNTER — Ambulatory Visit: Payer: Medicare Other

## 2022-02-16 VITALS — Ht 62.0 in | Wt 225.0 lb

## 2022-02-16 DIAGNOSIS — Z0000 Encounter for general adult medical examination without abnormal findings: Secondary | ICD-10-CM

## 2022-02-16 NOTE — Progress Notes (Signed)
? ?Subjective:  ? Regina VILLASENOR is a 81 y.o. female who presents for Medicare Annual (Subsequent) preventive examination. ? ?Review of Systems    ?Virtual Visit via Telephone Note ? ?I connected with  Regina [name] on 02/16/22 at  9:45 AM EDT by telephone and verified that I am speaking with the correct person using two identifiers. ? ?Location: ?Patient: Home ?Provider: Office ?Persons participating in the virtual visit: patient/Nurse Health Advisor ?  ?I discussed the limitations, risks, security and privacy concerns of performing an evaluation and management service by telephone and the availability of in person appointments. The patient expressed understanding and agreed to proceed. ? ?Interactive audio and video telecommunications were attempted between this nurse and patient, however failed, due to patient having technical difficulties OR patient did not have access to video capability.  We continued and completed visit with audio only. ? ?Some vital signs may be absent or patient reported.  ? ?Criselda Peaches, LPN  ?Cardiac Risk Factors include: advanced age (>25mn, >>57women);hypertension;diabetes mellitus ? ?   ?Objective:  ?  ?Today's Vitals  ? 02/16/22 01941 ?Weight: 225 lb (102.1 kg)  ?Height: _0  (1.575 m)  ? ?Body mass index is 41.15 kg/m?. ? ? ?  02/16/2022  ? 10:05 AM 12/30/2021  ? 10:02 AM 12/24/2021  ? 10:22 AM 02/22/2021  ?  9:39 AM 02/10/2021  ?  9:45 AM 10/07/2020  ?  3:54 PM 08/05/2020  ?  8:49 AM  ?Advanced Directives  ?Does Patient Have a Medical Advance Directive? Yes Yes No No;Yes Yes Yes Yes  ?Type of AParamedicof [place]Living will [place] will   Healthcare Power of [place]Living will [place] will  ?Does patient want to make changes to medical advance directive? No - Patient declined No - Patient declined       ?Copy of [place] Chart? No - copy  requested No - copy requested   No - copy requested No - copy requested No - copy requested  ?Would patient like information on creating a medical advance directive?   No - Patient declined      ? ? ?Current Medications (verified) ?Outpatient Encounter Medications as of 02/16/2022  ?Medication Sig  ? acetaminophen (TYLENOL) 500 MG tablet Take 1,000 mg by mouth every 6 (six) hours as needed for mild pain.  ? Alcohol Swabs (B-D SINGLE USE SWABS REGULAR) PADS Use to test blood sugar up to 3 times daily  ? allopurinol (ZYLOPRIM) 100 MG tablet TAKE ONE TABLET BY MOUTH EVERY MORNING  ? aspirin EC 81 MG EC tablet Take 1 tablet (81 mg total) by mouth daily. Swallow whole.  ? atorvastatin (LIPITOR) 20 MG tablet TAKE ONE TABLET BY MOUTH EVERYDAY AT BEDTIME  ? Blood Glucose Monitoring Suppl (ACCU-CHEK GUIDE) w/Device KIT 1 Device by Does not apply route 3 (three) times daily.  ? Capsaicin 0.033 % CREA Apply 1 application topically 3 (three) times daily. (Patient taking differently: Apply 1 application. topically 3 (three) times daily. As needed)  ? Cholecalciferol (VITAMIN D3) 50 MCG (2000 UT) capsule Take 2,000 Units by mouth daily.  ? Continuous Blood Gluc Sensor (DEXCOM G6 SENSOR) MISC 1 Device by Does not apply route as directed.  ? Continuous Blood Gluc Transmit (DEXCOM G6 TRANSMITTER) MISC 1 Device by Does not apply route as directed.  ? famotidine (PEPCID) 20 MG tablet Take 1 tablet (20 mg total)  by mouth at bedtime.  ? FARXIGA 10 MG TABS tablet Take 1 tablet (10 mg total) by mouth daily.  ? gabapentin (NEURONTIN) 600 MG tablet TAKE ONE TABLET BY MOUTH EVERY MORNING and TAKE ONE TABLET BY MOUTH EVERYDAY AT BEDTIME  ? glucose blood (ACCU-CHEK GUIDE) test strip Use to test blood sugar up to 3 times daily  ? insulin glargine, 1 Unit Dial, (TOUJEO SOLOSTAR) 300 UNIT/ML Solostar Pen Inject 24 Units into the skin daily. Eat a snack with protein nightly before bedtime.  ? insulin lispro (HUMALOG KWIKPEN) 100 UNIT/ML KwikPen Max  daily 30 units  ? Insulin Pen Needle 32G X 4 MM MISC 1 Device by Does not apply route in the morning, at noon, in the evening, and at bedtime.  ? ipratropium (ATROVENT) 0.06 % nasal spray Place 2 sprays into both nostrils 4 (four) times daily. (Patient taking differently: Place 2 sprays into both nostrils 4 (four) times daily. As needed)  ? Lancets Misc. (ACCU-CHEK FASTCLIX LANCET) KIT Use to test blood sugar up to 3 times daily  ? levothyroxine (SYNTHROID) 150 MCG tablet TAKE ONE TABLET BY MOUTH BEFORE BREAKFAST  ? magnesium oxide (MAG-OX) 400 MG tablet TAKE ONE TABLET BY MOUTH EVERY MORNING  ? meclizine (ANTIVERT) 25 MG tablet Take 25 mg by mouth daily as needed for dizziness.  ? metoprolol succinate (TOPROL-XL) 25 MG 24 hr tablet Take 1 tablet (25 mg total) by mouth daily. Take with or immediately following a meal.  ? nitroGLYCERIN (NITROSTAT) 0.4 MG SL tablet Place 1 tablet under tongue for chest pain. Repeat every five minutes 2 more times if needed for a total of 3 tablets within 15 minutes.  ? pantoprazole (PROTONIX) 20 MG tablet Take 1 tablet (20 mg total) by mouth daily.  ? Polyethylene Glycol 3350 (MIRALAX PO) Take 1 Package by mouth daily as needed (constipation).  ? potassium chloride SA (KLOR-CON M20) 20 MEQ tablet Take 1 tablet (20 mEq total) by mouth daily.  ? Propylene Glycol (SYSTANE BALANCE OP) Place 1 drop into both eyes daily as needed (for dry eyes).  ? SALINE MIST SPRAY NA Place 2 sprays into the nose at bedtime.  ? Tafamidis 61 MG CAPS Take 61 mg by mouth daily.  ? torsemide (DEMADEX) 20 MG tablet Take 2 tablets (40 mg total) by mouth daily.  ? trolamine salicylate (ASPERCREME) 10 % cream Apply 1 application. topically as needed for muscle pain.  ? warfarin (COUMADIN) 5 MG tablet TAKE ONE-HALF TO 1 TABLET BY MOUTH ONCE daily OR AS DIRECTED by THE coumadin clinic  ? ?No facility-administered encounter medications on file as of 02/16/2022.  ? ? ?Allergies (verified) ?Ace inhibitors, Amlodipine,  Atenolol, Avandia [rosiglitazone], Darvon [propoxyphene], Erythromycin, Hydralazine, Hydrocodone, Levofloxacin, Morphine and related, Percocet [oxycodone-acetaminophen], Spironolactone, and Tramadol  ? ?History: ?Past Medical History:  ?Diagnosis Date  ? Back pain   ? CHF (congestive heart failure) ([place])   ? 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 Chronic combined systolic and diastolic CHF (congestive heart failure) ([place])   ? Diabetes mellitus without complication ([place])   ? GERD (gastroesophageal reflux disease)   ? Heart disease   ? Hypertension   ? Hypothyroidism   ? Joint pain   ? Mini stroke   ? Non-obstructive CAD   ? a. 01/2015 Cardiolite: + inf wall ischemia, EF 56%;  b. 01/2015 Cath: LM nl, LAD 57m LCX min irregs, RCA dominant, 50-625m ? Sleep apnea   ? Swallowing difficulty   ? ?  Past Surgical History:  ?Procedure Laterality Date  ? ABDOMINAL HYSTERECTOMY    ? BUBBLE STUDY  07/01/2020  ? Procedure: BUBBLE STUDY;  Surgeon: Elouise Munroe, MD;  Location: [facility];  Service: Cardiovascular;;  ? BUBBLE STUDY  08/05/2020  ? Procedure: BUBBLE STUDY;  Surgeon: Geralynn Rile, MD;  Location: [facility];  Service: Cardiovascular;;  done with definity   ? CARDIAC CATHETERIZATION    ? ESOPHAGOGASTRODUODENOSCOPY    ? a. 01/2015 EGD: patent esophagus.  ? ESOPHAGOGASTRODUODENOSCOPY N/A 01/20/2015  ? Procedure: ESOPHAGOGASTRODUODENOSCOPY (EGD);  Surgeon: Carol Ada, MD;  Location[facility] ENDOSCOPY;  Service: Endoscopy;  Laterality: N/A;  ? HAND SURGERY    ? JOINT REPLACEMENT    ? reports history bilateral TKA and right TSA  ? LEFT HEART CATHETERIZATION WITH CORONARY ANGIOGRAM N/A 01/19/2015  ? RIGHT HEART CATH N/A 12/27/2021  ? Procedure: RIGHT HEART CATH;  Surgeon: Lorretta Harp, MD;  Location: [facility];  Service: Cardiovascular;  Laterality: N/A;  ? TEE WITHOUT CARDIOVERSION  05/04/2020  ? TEE WITHOUT CARDIOVERSION N/A 05/05/2020  ? Procedure: TRANSESOPHAGEAL ECHOCARDIOGRAM (TEE);   Surgeon: Pixie Casino, MD;  Location: [facility] ENDOSCOPY;  Service: Cardiovascular;  Laterality: N/A;  ? TEE WITHOUT CARDIOVERSION N/A 07/01/2020  ? Procedure: TRANSESOPHAGEAL ECHOCARDIOGRAM (TEE);  Surgeon: Alba Cory

## 2022-02-16 NOTE — Patient Instructions (Addendum)
?Ms. [name] , ?Thank you for taking time to come for your Medicare Wellness Visit. I appreciate your ongoing commitment to your health goals. Please review the following plan we discussed and let me know if I can assist you in the future.  ? ?These are the goals we discussed: ? Goals   ? ?   Manage My Medicine   ?   Timeframe:  Short-Term Goal ?Priority:  Medium ?Start Date:                             ?Expected End Date:                      ? ?Follow Up Date 07/15/2021  ?  ?- call for medicine refill 2 or 3 days before it runs out ?- keep a list of all the medicines I take; vitamins and herbals too ?- use a pillbox to sort medicine ?- use an alarm clock or phone to remind me to take my medicine  ?  ?Why is this important?   ?These steps will help you keep on track with your medicines. ?  ?Notes:  ?  ?   Patient Stated (pt-stated)   ?   Manage my diabetes. ?  ? ?  ?  ?This is a list of the screening recommended for you and due dates:  ?Health Maintenance  ?Topic Date Due  ? Zoster (Shingles) Vaccine (2 of 2) 12/18/2021  ? COVID-19 Vaccine (5 - Booster for Pfizer series) 03/04/2022*  ? Flu Shot  05/17/2022  ? Eye exam for diabetics  07/02/2022  ? Hemoglobin A1C  08/02/2022  ? Urine Protein Check  08/13/2022  ? Complete foot exam   02/01/2023  ? Pneumonia Vaccine  Completed  ? DEXA scan (bone density measurement)  Completed  ? Hepatitis C Screening: USPSTF Recommendation to screen - Ages 36-79 yo.  Completed  ? HPV Vaccine  Aged Out  ? Tetanus Vaccine  Discontinued  ?*Topic was postponed. The date shown is not the original due date.  ? ?Advanced directives: Yes Copies on file ? ?Conditions/risks identified: None ? ?Next appointment: Follow up in one year for your annual wellness visit  ? ? ? ?Preventive Care 9 Years and Older, Female ?Preventive care refers to lifestyle choices and visits with your health care provider that can promote health and wellness. ?What does preventive care include? ?A yearly physical  exam. This is also called an annual well check. ?Dental exams once or twice a year. ?Routine eye exams. Ask your health care provider how often you should have your eyes checked. ?Personal lifestyle choices, including: ?Daily care of your teeth and gums. ?Regular physical activity. ?Eating a healthy diet. ?Avoiding tobacco and drug use. ?Limiting alcohol use. ?Practicing safe sex. ?Taking low-dose aspirin every day. ?Taking vitamin and mineral supplements as recommended by your health care provider. ?What happens during an annual well check? ?The services and screenings done by your health care provider during your annual well check will depend on your age, overall health, lifestyle risk factors, and family history of disease. ?Counseling  ?Your health care provider may ask you questions about your: ?Alcohol use. ?Tobacco use. ?Drug use. ?Emotional well-being. ?Home and relationship well-being. ?Sexual activity. ?Eating habits. ?History of falls. ?Memory and ability to understand (cognition). ?Work and work Statistician. ?Reproductive health. ?Screening  ?You may have the following tests or measurements: ?Height, weight, and BMI. ?Blood pressure. ?  Lipid and cholesterol levels. These may be checked every 5 years, or more frequently if you are over 52 years old. ?Skin check. ?Lung cancer screening. You may have this screening every year starting at age 27 if you have a 30-pack-year history of smoking and currently smoke or have quit within the past 15 years. ?Fecal occult blood test (FOBT) of the stool. You may have this test every year starting at age 61. ?Flexible sigmoidoscopy or colonoscopy. You may have a sigmoidoscopy every 5 years or a colonoscopy every 10 years starting at age 62. ?Hepatitis C blood test. ?Hepatitis B blood test. ?Sexually transmitted disease (STD) testing. ?Diabetes screening. This is done by checking your blood sugar (glucose) after you have not eaten for a while (fasting). You may have this  done every 1-3 years. ?Bone density scan. This is done to screen for osteoporosis. You may have this done starting at age 44. ?Mammogram. This may be done every 1-2 years. Talk to your health care provider about how often you should have regular mammograms. ?Talk with your health care provider about your test results, treatment options, and if necessary, the need for more tests. ?Vaccines  ?Your health care provider may recommend certain vaccines, such as: ?Influenza vaccine. This is recommended every year. ?Tetanus, diphtheria, and acellular pertussis (Tdap, Td) vaccine. You may need a Td booster every 10 years. ?Zoster vaccine. You may need this after age 71. ?Pneumococcal 13-valent conjugate (PCV13) vaccine. One dose is recommended after age 17. ?Pneumococcal polysaccharide (PPSV23) vaccine. One dose is recommended after age 39. ?Talk to your health care provider about which screenings and vaccines you need and how often you need them. ?This information is not intended to replace advice given to you by your health care provider. Make sure you discuss any questions you have with your health care provider. ?Document Released: 10/30/2015 Document Revised: 06/22/2016 Document Reviewed: 08/04/2015 ?Elsevier Interactive Patient Education ? 2017 [place]. ? ?Fall Prevention in the Home ?Falls can cause injuries. They can happen to people of all ages. There are many things you can do to make your home safe and to help prevent falls. ?What can I do on the outside of my home? ?Regularly fix the edges of walkways and driveways and fix any cracks. ?Remove anything that might make you trip as you walk through a door, such as a raised step or threshold. ?Trim any bushes or trees on the path to your home. ?Use bright outdoor lighting. ?Clear any walking paths of anything that might make someone trip, such as rocks or tools. ?Regularly check to see if handrails are loose or broken. Make sure that both sides of any steps have  handrails. ?Any raised decks and porches should have guardrails on the edges. ?Have any leaves, snow, or ice cleared regularly. ?Use sand or salt on walking paths during winter. ?Clean up any spills in your garage right away. This includes oil or grease spills. ?What can I do in the bathroom? ?Use night lights. ?Install grab bars by the toilet and in the tub and shower. Do not use towel bars as grab bars. ?Use non-skid mats or decals in the tub or shower. ?If you need to sit down in the shower, use a plastic, non-slip stool. ?Keep the floor dry. Clean up any water that spills on the floor as soon as it happens. ?Remove soap buildup in the tub or shower regularly. ?Attach bath mats securely with double-sided non-slip rug tape. ?Do not have throw rugs  and other things on the floor that can make you trip. ?What can I do in the bedroom? ?Use night lights. ?Make sure that you have a light by your bed that is easy to reach. ?Do not use any sheets or blankets that are too big for your bed. They should not hang down onto the floor. ?Have a firm chair that has side arms. You can use this for support while you get dressed. ?Do not have throw rugs and other things on the floor that can make you trip. ?What can I do in the kitchen? ?Clean up any spills right away. ?Avoid walking on wet floors. ?Keep items that you use a lot in easy-to-reach places. ?If you need to reach something above you, use a strong step stool that has a grab bar. ?Keep electrical cords out of the way. ?Do not use floor polish or wax that makes floors slippery. If you must use wax, use non-skid floor wax. ?Do not have throw rugs and other things on the floor that can make you trip. ?What can I do with my stairs? ?Do not leave any items on the stairs. ?Make sure that there are handrails on both sides of the stairs and use them. Fix handrails that are broken or loose. Make sure that handrails are as long as the stairways. ?Check any carpeting to make sure  that it is firmly attached to the stairs. Fix any carpet that is loose or worn. ?Avoid having throw rugs at the top or bottom of the stairs. If you do have throw rugs, attach them to the floor with carpet

## 2022-02-21 ENCOUNTER — Ambulatory Visit: Payer: Medicare Other | Admitting: Pulmonary Disease

## 2022-02-21 ENCOUNTER — Encounter: Payer: Self-pay | Admitting: Pulmonary Disease

## 2022-02-21 VITALS — BP 126/72 | HR 75 | Ht 62.0 in | Wt 220.3 lb

## 2022-02-21 DIAGNOSIS — G4733 Obstructive sleep apnea (adult) (pediatric): Secondary | ICD-10-CM

## 2022-02-21 NOTE — Progress Notes (Signed)
?Subjective:  ?  ? Patient ID: Regina [name], female   DOB: Jan 15, 1941, 81 y.o.   MRN: [id number] ? ?Patient with a history of obstructive sleep apnea ?History of lung nodules ? ?Has been doing relatively well with the CPAP, has been having some problems with the mask, her nose feels sore whenever she gets up in the morning ? ?Sleeping well ?Wakes up feeling refreshed ? ?Continues to use CPAP on a nightly basis ? ?CT scan on 02/17/2020 shows stable lung nodules ? ?She does have some leak ?-We talked about adjusting the mask as possible ? ?Shortness of breath with activity ?-Did talk about graded exercises ?-She is considering going back to the gym ?-She did receive all vaccines ? ?Never smoker ?No pertinent occupational history ? ?She has been hospitalized recently ?Has had at least 2 falls recently ?-History of atrial fibrillation with anticoagulation ? ?Past Medical History:  ?Diagnosis Date  ? Back pain   ? CHF (congestive heart failure) ([place])   ? 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 Chronic combined systolic and diastolic CHF (congestive heart failure) ([place])   ? Diabetes mellitus without complication ([place])   ? GERD (gastroesophageal reflux disease)   ? Heart disease   ? Hypertension   ? Hypothyroidism   ? Joint pain   ? Mini stroke   ? Non-obstructive CAD   ? a. 01/2015 Cardiolite: + inf wall ischemia, EF 56%;  b. 01/2015 Cath: LM nl, LAD 29m LCX min irregs, RCA dominant, 50-678m ? Sleep apnea   ? Swallowing difficulty   ? ?Social History  ? ?Socioeconomic History  ? Marital status: Widowed  ?  Spouse name: Not on file  ? Number of children: Not on file  ? Years of education: Not on file  ? Highest education level: Not on file  ?Occupational History  ? Occupation: Retired  ?Tobacco Use  ? Smoking status: Never  ? Smokeless tobacco: Never  ?Vaping Use  ? Vaping Use: Never used  ?Substance and Sexual Activity  ? Alcohol use: No  ? Drug use: Never  ? Sexual activity: Not Currently  ?Other Topics  Concern  ? Not on file  ?Social History Narrative  ? Lives with daughter  ? ?Social Determinants of Health  ? ?Financial Resource Strain: Low Risk   ? Difficulty of Paying Living Expenses: Not hard at all  ?Food Insecurity: No Food Insecurity  ? Worried About RuCharity fundraisern the Last Year: Never true  ? Ran Out of Food in the Last Year: Never true  ?Transportation Needs: No Transportation Needs  ? Lack of Transportation (Medical): No  ? Lack of Transportation (Non-Medical): No  ?Physical Activity: Inactive  ? Days of Exercise per Week: 0 days  ? Minutes of Exercise per Session: 0 min  ?Stress: No Stress Concern Present  ? Feeling of Stress : Not at all  ?Social Connections: Moderately Integrated  ? Frequency of Communication with Friends and Family: More than three times a week  ? Frequency of Social Gatherings with Friends and Family: More than three times a week  ? Attends Religious Services: More than 4 times per year  ? Active Member of Clubs or Organizations: Yes  ? Attends ClArchivisteetings: More than 4 times per year  ? Marital Status: Widowed  ?Intimate Partner Violence: Not At Risk  ? Fear of Current or Ex-Partner: No  ? Emotionally Abused: No  ? Physically Abused: No  ?  Sexually Abused: No  ? ?Family History  ?Problem Relation Age of Onset  ? Heart disease Mother   ? Hypertension Mother   ? Cancer Father   ? Alcoholism Father   ? ? ? ?Review of Systems  ?Constitutional:  Negative for fever and unexpected weight change.  ?HENT:  Negative for congestion, dental problem, ear pain, nosebleeds, postnasal drip, rhinorrhea, sinus pressure, sneezing, sore throat and trouble swallowing.   ?Eyes:  Negative for redness and itching.  ?Respiratory:  Positive for shortness of breath. Negative for cough, chest tightness and wheezing.   ?Cardiovascular:  Negative for palpitations and leg swelling.  ?Gastrointestinal:  Negative for nausea and vomiting.  ?Genitourinary:  Negative for dysuria.   ?Musculoskeletal:  Negative for joint swelling.  ?Skin:  Negative for rash.  ?Allergic/Immunologic: Negative.  Negative for environmental allergies, food allergies and immunocompromised state.  ?Neurological:  Negative for headaches.  ?Hematological:  Does not bruise/bleed easily.  ?Psychiatric/Behavioral:  Negative for dysphoric mood. The patient is not nervous/anxious.   ? ?   ?Objective:  ? Physical Exam ?Constitutional:   ?   Appearance: She is obese.  ?HENT:  ?   Nose: Nose normal.  ?Cardiovascular:  ?   Rate and Rhythm: Normal rate and regular rhythm.  ?   Pulses: Normal pulses.  ?   Heart sounds: No murmur heard. ?Pulmonary:  ?   Effort: Pulmonary effort is normal. No respiratory distress.  ?   Breath sounds: Normal breath sounds. No stridor. No wheezing or rhonchi.  ?Musculoskeletal:  ?   Cervical back: Normal range of motion and neck supple. No rigidity. No muscular tenderness.  ?Neurological:  ?   Mental Status: She is alert.  ? ?Vitals:  ? 02/21/22 1334  ?BP: 126/72  ?Pulse: 75  ?SpO2: 99%  ? ?CT scan reviewed showing stable nodules ? ?PFT shows no obstruction, no restriction ? ?CPAP compliance reveals 84% compliance ?Average use of 5 hours 16 minutes ?Set between 4 and 20 ?95 percentile pressure of 10.8 ?Residual AHI 1.7 ?   ?Assessment:  ?   ?Obstructive sleep apnea ?-Encourage continued compliance ?-We will send prescription into medical supply company for CPAP mask change ? ? ?History of lung nodule that needed follow-up ?-CAD with calcified nodules ?-Lung nodules are stable ? ?Shortness of breath on exertion ?-Graded exercises will help ?-No significant issues other PFT and echocardiogram to limit activity ? ?History of atrial fibrillation ?-With recent falls, risk versus benefits of anticoagulation need to be weighed ?-Risk of strokes may still outweigh bleeding at present ? ?   ?Plan:  ?   ?Continue graded exercises ? ?Continue CPAP on a nightly basis ?We will send prescription into medical supply  company for CPAP supplies ? ?Nasal mask without prongs may help ? ?Risks and sequelae of untreated sleep disordered breathing discussed ? ?Weight loss efforts encouraged ? ?Tentative follow-up in 6 months ? ?

## 2022-02-21 NOTE — Patient Instructions (Addendum)
Continue using your CPAP nightly ? ?We will send a prescription to the medical supply company for CPAP supplies ?-New mask ?-Nasal nasal mask without the nose prongs ? ?If you are still not tolerating CPAP well with a new mask, we may try and adjust the pressures ? ?I will see you back in about 6 months ? ?Call with significant concerns ?

## 2022-03-01 ENCOUNTER — Ambulatory Visit: Payer: Medicare Other | Admitting: Neurology

## 2022-03-04 ENCOUNTER — Ambulatory Visit: Payer: Medicare Other

## 2022-03-04 DIAGNOSIS — Z7901 Long term (current) use of anticoagulants: Secondary | ICD-10-CM | POA: Diagnosis not present

## 2022-03-04 DIAGNOSIS — I4819 Other persistent atrial fibrillation: Secondary | ICD-10-CM

## 2022-03-04 LAB — POCT INR: INR: 2.4 (ref 2.0–3.0)

## 2022-03-04 NOTE — Patient Instructions (Signed)
Continue 1/2 tablet daily.  Repeat INR in 3 weeks; [phone];

## 2022-03-06 NOTE — Progress Notes (Unsigned)
Cardiology Office Note:   Date:  03/08/2022  NAME:  Regina [name]    MRN: [id number] DOB:  04-Feb-1941   PCP:  [name], [name], MD  Cardiologist:  Evalina Field, MD  Electrophysiologist:  None   Referring MD: [name], [name], MD   Chief Complaint  Patient presents with   Follow-up   History of Present Illness:   Regina [name] is a 81 y.o. female with a hx of cardiac amyloidosis, persistent Afib, DM, HTN, non-obstructive CAD, CKD III who presents for follow-up.  She reports she is doing well.  Still having some abdominal distention every now and then.  Weight is 224 pounds which is stable.  Blood pressure 140/78.  No dizziness or lightheadedness.  Volume status is stable.  She is remains in A-fib but rate controlled.  Has not had any syncopal episodes.  She is adhering to a low-salt diet.  Most recent A1c 7.3.  Going to start water aerobics.  Seems to be doing well.  All of her complaints are stable.  Awaiting to see if she can get genetic monitoring therapy for her hereditary amyloidosis.  We are awaiting final approval on this.    Problem List 1.  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 Persistent Afib -LAA slugde vs thrombus 05/05/2020  -persistent sludge 07/01/2020 -LAA thrombus persistent 08/05/2020 -on warfarin 3. DM -A1c 7.3 4. HLD -T chol 115, HDL 54, LDL 40, triglycerides 101 5. HTN 6. Non-obstructive CAD -LHC 01/19/2015 [facility] [place] -50% mid RCA and 50% LAD -MPI 08/14/2020 -> normal 7. OSA 8. CKD 3/4 9. CVA -11/2020 @ [facility]  10. 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 01/2015 Cardiolite: + inf wall ischemia, EF 56%;  b. 01/2015 Cath: LM nl, LAD 14m LCX min irregs, RCA dominant, 50-694m  Sleep apnea    Swallowing difficulty     Past Surgical History: Past Surgical History:  Procedure Laterality Date   ABDOMINAL HYSTERECTOMY     BUBBLE STUDY  07/01/2020   Procedure: BUBBLE STUDY;  Surgeon: AcElouise MunroeMD;  [facility]: Cardi[facility];;   BUBBLE STUDY  08/05/2020   Procedure: BUBBLE STUDY;  Surgeon: O'Geralynn RileMD;  [facility]: Cardi[facility];;  done with definity    CARDIAC CATHETERIZATION     ESOPHAGOGASTRODUODENOSCOPY     a. 01/2015 EGD: patent esophagus.   ESOPHAGOGASTRODUODENOSCOPY N/A 01/20/2015   Procedure: ESOPHAGOGASTRODUODENOSCOPY (EGD);  Surgeon: PaCarol AdaMD;  [facility];  Service: Endoscopy;  Laterality: N/A;   HAND SURGERY     JOINT REPLACEMENT     reports history bilateral TKA and right TSA   LEFT HEART CATHETERIZATION WITH CORONARY ANGIOGRAM N/A 01/19/2015   RIGHT HEART CATH N/A 12/27/2021   Procedure: RIGHT HEART CATH;  Surgeon: BeLorretta HarpMD;  Location:  [place] INVASIVE CV LAB;  Service: Cardiovascular;  Laterality: N/A;   TEE WITHOUT CARDIOVERSION  05/04/2020   TEE WITHOUT CARDIOVERSION N/A 05/05/2020   Procedure: TRANSESOPHAGEAL ECHOCARDIOGRAM (TEE);  Surgeon: Pixie Casino, MD;  Location: [facility] ENDOSCOPY;  Service: Cardiovascular;  Laterality: N/A;   TEE WITHOUT CARDIOVERSION N/A 07/01/2020   Procedure: TRANSESOPHAGEAL ECHOCARDIOGRAM (TEE);  Surgeon: Elouise Munroe, MD;  Location: [place];  Service: Cardiovascular;  Laterality: N/A;   TEE WITHOUT CARDIOVERSION N/A 08/05/2020   Procedure: TRANSESOPHAGEAL ECHOCARDIOGRAM (TEE);  Surgeon: Geralynn Rile, MD;   Location: [place];  Service: Cardiovascular;  Laterality: N/A;   THYROIDECTOMY      Current Medications: Current Meds  Medication Sig   acetaminophen (TYLENOL) 500 MG tablet Take 1,000 mg by mouth every 6 (six) hours as needed for mild pain.   Alcohol Swabs (B-D SINGLE USE SWABS REGULAR) PADS Use to test blood sugar up to 3 times daily   allopurinol (ZYLOPRIM) 100 MG tablet TAKE ONE TABLET BY MOUTH EVERY MORNING   aspirin EC 81 MG EC tablet Take 1 tablet (81 mg total) by mouth daily. Swallow whole.   atorvastatin (LIPITOR) 20 MG tablet TAKE ONE TABLET BY MOUTH EVERYDAY AT BEDTIME   Blood Glucose Monitoring Suppl (ACCU-CHEK GUIDE) w/Device KIT 1 Device by Does not apply route 3 (three) times daily.   Capsaicin 0.033 % CREA Apply 1 application topically 3 (three) times daily. (Patient taking differently: Apply 1 application. topically 3 (three) times daily. As needed)   Cholecalciferol (VITAMIN D3) 50 MCG (2000 UT) capsule Take 2,000 Units by mouth daily.   Continuous Blood Gluc Sensor (DEXCOM G6 SENSOR) MISC 1 Device by Does not apply route as directed.   Continuous Blood Gluc Transmit (DEXCOM G6 TRANSMITTER) MISC 1 Device by Does not apply route as directed.   famotidine (PEPCID) 20 MG tablet Take 1 tablet (20 mg total) by mouth at bedtime.   FARXIGA 10 MG TABS tablet Take 1 tablet (10 mg total) by mouth daily.   gabapentin (NEURONTIN) 600 MG tablet TAKE ONE TABLET BY MOUTH EVERY MORNING and TAKE ONE TABLET BY MOUTH EVERYDAY AT BEDTIME   glucose blood (ACCU-CHEK GUIDE) test strip Use to test blood sugar up to 3 times daily   insulin glargine, 1 Unit Dial, (TOUJEO SOLOSTAR) 300 UNIT/ML Solostar Pen Inject 24 Units into the skin daily. Eat a snack with protein nightly before bedtime.   insulin lispro (HUMALOG KWIKPEN) 100 UNIT/ML KwikPen Max daily 30 units   Insulin Pen Needle 32G X 4 MM MISC 1 Device by Does not apply route in the morning, at noon, in the evening, and at bedtime.    ipratropium (ATROVENT) 0.06 % nasal spray Place 2 sprays into both nostrils 4 (four) times daily. (Patient taking differently: Place 2 sprays into both nostrils 4 (four) times daily. As needed)   Lancets Misc. (ACCU-CHEK FASTCLIX LANCET) KIT Use to test blood sugar up to 3 times daily   levothyroxine (SYNTHROID) 150 MCG tablet TAKE ONE TABLET BY MOUTH BEFORE BREAKFAST   magnesium oxide (MAG-OX) 400 MG tablet TAKE ONE TABLET BY MOUTH EVERY MORNING   meclizine (ANTIVERT) 25 MG tablet Take 25 mg by mouth daily as needed for dizziness.   metoprolol succinate (TOPROL-XL) 25 MG 24 hr tablet Take 1 tablet (25 mg total) by mouth daily. Take with or immediately following a meal.   nitroGLYCERIN (NITROSTAT) 0.4 MG SL tablet Place 1 tablet under tongue for chest pain. Repeat every five minutes 2 more  times if needed for a total of 3 tablets within 15 minutes.   pantoprazole (PROTONIX) 20 MG tablet Take 1 tablet (20 mg total) by mouth daily.   Polyethylene Glycol 3350 (MIRALAX PO) Take 1 Package by mouth daily as needed (constipation).   potassium chloride SA (KLOR-CON M20) 20 MEQ tablet Take 1 tablet (20 mEq total) by mouth daily.   Propylene Glycol (SYSTANE BALANCE OP) Place 1 drop into both eyes daily as needed (for dry eyes).   SALINE MIST SPRAY NA Place 2 sprays into the nose at bedtime.   Tafamidis 61 MG CAPS Take 61 mg by mouth daily.   torsemide (DEMADEX) 20 MG tablet Take 2 tablets (40 mg total) by mouth daily.   trolamine salicylate (ASPERCREME) 10 % cream Apply 1 application. topically as needed for muscle pain.   warfarin (COUMADIN) 5 MG tablet TAKE ONE-HALF TO 1 TABLET BY MOUTH ONCE daily OR AS DIRECTED by THE coumadin clinic     Allergies:    Ace inhibitors, Amlodipine, Atenolol, Avandia [rosiglitazone], Darvon [propoxyphene], Erythromycin, Hydralazine, Hydrocodone, Levofloxacin, Morphine and related, Percocet [oxycodone-acetaminophen], Spironolactone, and Tramadol   Social History: Social  History   Socioeconomic History   Marital status: Widowed    Spouse name: Not on file   Number of children: Not on file   Years of education: Not on file   Highest education level: Not on file  Occupational History   Occupation: Retired  Tobacco Use   Smoking status: Never   Smokeless tobacco: Never  Vaping Use   Vaping Use: Never used  Substance and Sexual Activity   Alcohol use: No   Drug use: Never   Sexual activity: Not Currently  Other Topics Concern   Not on file  Social History Narrative   Lives with daughter   Social Determinants of Health   Financial Resource Strain: Low Risk    Difficulty of Paying Living Expenses: Not hard at all  Food Insecurity: No Food Insecurity   Worried About Charity fundraiser in the Last Year: Never true   [place] in the Last Year: Never true  Transportation Needs: No Transportation Needs   Lack of Transportation (Medical): No   Lack of Transportation (Non-Medical): No  Physical Activity: Inactive   Days of Exercise per Week: 0 days   Minutes of Exercise per Session: 0 min  Stress: No Stress Concern Present   Feeling of Stress : Not at all  Social Connections: Moderately Integrated   Frequency of Communication with Friends and Family: More than three times a week   Frequency of Social Gatherings with Friends and Family: More than three times a week   Attends Religious Services: More than 4 times per year   Active Member of [employer] or Organizations: Yes   Attends Archivist Meetings: More than 4 times per year   Marital Status: Widowed     Family History: The patient's family history includes Alcoholism in her father; Cancer in her father; Heart disease in her mother; Hypertension in her mother.  ROS:   All other ROS reviewed and negative. Pertinent positives noted in the HPI.     EKGs/Labs/Other Studies Reviewed:   The following studies were personally reviewed by me today:  EKG:  EKG is ordered today.  The  ekg ordered today demonstrates atrial fibrillation heart rate 87, no acute ischemic changes, and was personally reviewed by me.   TTE 12/24/2021  1. Left ventricular ejection fraction, by estimation, is  40 to 45%. The  left ventricle has mildly decreased function. The left ventricle  demonstrates global hypokinesis. There is mild concentric left ventricular  hypertrophy. Left ventricular diastolic  parameters are indeterminate.   2. Right ventricular systolic function is low normal. The right  ventricular size is normal. Mildly increased right ventricular wall  thickness. There is normal pulmonary artery systolic pressure.   3. Left atrial size was mildly dilated.   4. The mitral valve is normal in structure. Mild mitral valve  regurgitation. No evidence of mitral stenosis.   5. The aortic valve was not well visualized. Aortic valve regurgitation  is not visualized. No aortic stenosis is present.   Recent Labs: 06/09/2021: TSH 0.65 12/28/2021: Magnesium 2.4 01/04/2022: Hemoglobin 13.3; Platelets 290 01/12/2022: B Natriuretic Peptide 420.4; BUN 55; Creatinine, Ser 2.18; Potassium 3.9; Sodium 140   Recent Lipid Panel    Component Value Date/Time   CHOL 115 08/13/2021 1501   CHOL 117 03/24/2020 1428   TRIG 101.0 08/13/2021 1501   HDL 54.80 08/13/2021 1501   HDL 56 03/24/2020 1428   CHOLHDL 2 08/13/2021 1501   VLDL 20.2 08/13/2021 1501   LDLCALC 40 08/13/2021 1501   LDLCALC 49 03/24/2020 1428    Physical Exam:   VS:  BP 140/78   Pulse 87   Ht 5' 2"  (1.575 m)   Wt 224 lb 12.8 oz (102 kg)   SpO2 99%   BMI 41.12 kg/m    Wt Readings from Last 3 Encounters:  03/08/22 224 lb 12.8 oz (102 kg)  02/21/22 220 lb 4.8 oz (99.9 kg)  02/16/22 225 lb (102.1 kg)    General: Well nourished, well developed, in no acute distress Head: Atraumatic, normal size  Eyes: PEERLA, EOMI  Neck: Supple, no JVD Endocrine: No thryomegaly Cardiac: Normal S1, S2; irregular rhythm Lungs: Clear to  auscultation bilaterally, no wheezing, rhonchi or rales  Abd: Soft, nontender, no hepatomegaly  Ext: No edema, pulses 2+ Musculoskeletal: No deformities, BUE and BLE strength normal and equal Skin: Warm and dry, no rashes   Neuro: Alert and oriented to person, place, time, and situation, CNII-XII grossly intact, no focal deficits  Psych: Normal mood and affect   ASSESSMENT:   Regina [name] is a 81 y.o. female who presents for the following: 1. Persistent atrial fibrillation ([place])   2. Hereditary cardiac amyloidosis (HCC)   3. Chronic systolic congestive heart failure ([place])   4. Coronary artery disease involving native coronary artery of native heart with other form of angina pectoris ([place])     PLAN:   1. Persistent atrial fibrillation (HCC) -Long history of persistent atrial fibrillation with persistent left atrial appendage thrombus.  She has failed DOACs.  Now on Coumadin.  We have not reattempted TEE/cardioversion as she underwent 3 attempts.  We will continue with rate control strategy.  She has done well.  INR goal 2-3.  2. Hereditary cardiac amyloidosis ([place]) 3. Chronic systolic congestive heart failure ([place]) -She has read Coralyn Mark cardiac amyloidosis.  On tafamidis.  Volume status acceptable.  Adhering to a low-salt diet.  Currently on torsemide 40 mg daily.  She will continue this.  She will avoid any salt reductive strategies. -We have applied for amvuttra.  A lot of her symptoms are likely related to the neurologic issues related to hereditary cardiac amyloidosis.  Hopefully she will qualify for this gene sinus in therapy.  I believe this would be very important for her.  She has applied for Medicaid and  is awaiting for final approval.  Hopefully she can complete this and qualify for it.  Regardless she has done well.  Ejection fraction now 40-45%.  She would not tolerate GDMT due to CKD stage IV.  We will just continue with beta-blocker for rate control.  Overall has done well.   She has remained out of the hospital with only a recent admission in March of this year.  4. Coronary artery disease involving native coronary artery of native heart with other form of angina pectoris ([place]) -Nonobstructive disease on left heart cath in [place] in 2016.  Negative stress test recently.  Continue aspirin and statin therapy.  Most recent LDL at goal.   Disposition: Return in about 4 months (around 07/09/2022).  Medication Adjustments/Labs and Tests Ordered: Current medicines are reviewed at length with the patient today.  Concerns regarding medicines are outlined above.  Orders Placed This Encounter  Procedures   EKG 12-Lead   No orders of the defined types were placed in this encounter.   Patient Instructions  Medication Instructions:  The current medical regimen is effective;  continue present plan and medications.  *If you need a refill on your cardiac medications before your next appointment, please call your pharmacy*  Follow-Up: At [facility], you and your health needs are our priority.  As part of our continuing mission to provide you with exceptional heart care, we have created designated Provider Care Teams.  These Care Teams include your primary Cardiologist (physician) and Advanced Practice Providers (APPs -  Physician Assistants and Nurse Practitioners) who all work together to provide you with the care you need, when you need it.  We recommend signing up for the patient portal called "MyChart".  Sign up information is provided on this After Visit Summary.  MyChart is used to connect with patients for Virtual Visits (Telemedicine).  Patients are able to view lab/test results, encounter notes, upcoming appointments, etc.  Non-urgent messages can be sent to your provider as well.   To learn more about what you can do with MyChart, go to [email or web address].    Your next appointment:   4 month(s)  The format for your next appointment:   In Person  Provider:    Evalina Field, MD              Time Spent with Patient: I have spent a total of 35 minutes with patient reviewing hospital notes, telemetry, EKGs, labs and examining the patient as well as establishing an assessment and plan that was discussed with the patient.  > 50% of time was spent in direct patient care.  Signed, Addison Naegeli. Audie Box, MD, [place]  [address], [place], [place] [address] [phone]  03/08/2022 3:11 PM

## 2022-03-08 ENCOUNTER — Encounter: Payer: Self-pay | Admitting: Cardiovascular Disease

## 2022-03-08 ENCOUNTER — Ambulatory Visit: Payer: Medicare Other | Admitting: Cardiovascular Disease

## 2022-03-08 VITALS — BP 140/78 | HR 87 | Ht 62.0 in | Wt 224.8 lb

## 2022-03-08 DIAGNOSIS — I25118 Atherosclerotic heart disease of native coronary artery with other forms of angina pectoris: Secondary | ICD-10-CM

## 2022-03-08 DIAGNOSIS — I5022 Chronic systolic (congestive) heart failure: Secondary | ICD-10-CM

## 2022-03-08 DIAGNOSIS — I4819 Other persistent atrial fibrillation: Secondary | ICD-10-CM

## 2022-03-08 DIAGNOSIS — E854 Organ-limited amyloidosis: Secondary | ICD-10-CM | POA: Diagnosis not present

## 2022-03-08 DIAGNOSIS — I43 Cardiomyopathy in diseases classified elsewhere: Secondary | ICD-10-CM

## 2022-03-08 NOTE — Patient Instructions (Signed)
Medication Instructions:  The current medical regimen is effective;  continue present plan and medications.  *If you need a refill on your cardiac medications before your next appointment, please call your pharmacy*  Follow-Up: At [facility], you and your health needs are our priority.  As part of our continuing mission to provide you with exceptional heart care, we have created designated Provider Care Teams.  These Care Teams include your primary Cardiologist (physician) and Advanced Practice Providers (APPs -  Physician Assistants and Nurse Practitioners) who all work together to provide you with the care you need, when you need it.  We recommend signing up for the patient portal called "MyChart".  Sign up information is provided on this After Visit Summary.  MyChart is used to connect with patients for Virtual Visits (Telemedicine).  Patients are able to view lab/test results, encounter notes, upcoming appointments, etc.  Non-urgent messages can be sent to your provider as well.   To learn more about what you can do with MyChart, go to [email or web address].    Your next appointment:   4 month(s)  The format for your next appointment:   In Person  Provider:   Evalina Field, MD

## 2022-03-10 ENCOUNTER — Other Ambulatory Visit: Payer: Self-pay | Admitting: Family Medicine

## 2022-03-10 ENCOUNTER — Other Ambulatory Visit: Payer: Self-pay | Admitting: Cardiovascular Disease

## 2022-03-10 DIAGNOSIS — K219 Gastro-esophageal reflux disease without esophagitis: Secondary | ICD-10-CM

## 2022-03-11 ENCOUNTER — Telehealth: Payer: Self-pay | Admitting: Pharmacist

## 2022-03-11 ENCOUNTER — Telehealth: Payer: Self-pay

## 2022-03-11 DIAGNOSIS — I10 Essential (primary) hypertension: Secondary | ICD-10-CM

## 2022-03-11 DIAGNOSIS — E113499 Type 2 diabetes mellitus with severe nonproliferative diabetic retinopathy without macular edema, unspecified eye: Secondary | ICD-10-CM

## 2022-03-11 NOTE — Telephone Encounter (Signed)
-----   Message from Viona Gilmore, [facility] sent at 03/11/2022 11:51 AM EDT ----- Regarding: CCM referral Hi,  No rush but can you put in a CCM referral for Ms. Zolman when you get the chance?  Thank you! [name]

## 2022-03-11 NOTE — Chronic Care Management (AMB) (Unsigned)
Chronic Care Management Pharmacy Assistant   Name: Regina [name]  MRN: [id number] DOB: Nov 10, 1940  Reason for Encounter: Medication Review / Medication Coordination Call   Conditions to be addressed/monitored: HTN  Recent office visits:  02/16/2022 Regina Arbour LPN - Patient was seen for Medicare annual wellness exam.   Recent consult visits:  03/08/2022 Regina Chiquito MD (cardiology) - Patient was seen for Persistent atrial fibrillation and additional issues. No medication changes. Follow up in 4 months.   02/21/2022 Regina Rist MD (pulmonary) - Patient was seen for  OSA (obstructive sleep apnea).  No medication changes. Follow up in 6 months.   02/11/2022 Regina [name] DPM (podiatry) - Pain due to onychomycosis of toenail of left foot. No medication changes. Follow up in 3 months.   Hospital visits:  None  Medications: Outpatient Encounter Medications as of 03/11/2022  Medication Sig   acetaminophen (TYLENOL) 500 MG tablet Take 1,000 mg by mouth every 6 (six) hours as needed for mild pain.   Alcohol Swabs (B-D SINGLE USE SWABS REGULAR) PADS Use to test blood sugar up to 3 times daily   allopurinol (ZYLOPRIM) 100 MG tablet TAKE ONE TABLET BY MOUTH EVERY MORNING   aspirin EC 81 MG EC tablet Take 1 tablet (81 mg total) by mouth daily. Swallow whole.   atorvastatin (LIPITOR) 20 MG tablet TAKE ONE TABLET BY MOUTH EVERYDAY AT BEDTIME   Blood Glucose Monitoring Suppl (ACCU-CHEK GUIDE) w/Device KIT 1 Device by Does not apply route 3 (three) times daily.   Capsaicin 0.033 % CREA Apply 1 application topically 3 (three) times daily. (Patient taking differently: Apply 1 application. topically 3 (three) times daily. As needed)   Cholecalciferol (VITAMIN D3) 50 MCG (2000 UT) capsule Take 2,000 Units by mouth daily.   Continuous Blood Gluc Sensor (DEXCOM G6 SENSOR) MISC 1 Device by Does not apply route as directed.   Continuous Blood Gluc Transmit (DEXCOM G6 TRANSMITTER) MISC 1  Device by Does not apply route as directed.   famotidine (PEPCID) 20 MG tablet Take 1 tablet (20 mg total) by mouth at bedtime.   FARXIGA 10 MG TABS tablet Take 1 tablet (10 mg total) by mouth daily.   gabapentin (NEURONTIN) 600 MG tablet TAKE ONE TABLET BY MOUTH EVERY MORNING and TAKE ONE TABLET BY MOUTH EVERYDAY AT BEDTIME   glucose blood (ACCU-CHEK GUIDE) test strip Use to test blood sugar up to 3 times daily   insulin glargine, 1 Unit Dial, (TOUJEO SOLOSTAR) 300 UNIT/ML Solostar Pen Inject 24 Units into the skin daily. Eat a snack with protein nightly before bedtime.   insulin lispro (HUMALOG KWIKPEN) 100 UNIT/ML KwikPen Max daily 30 units   Insulin Pen Needle 32G X 4 MM MISC 1 Device by Does not apply route in the morning, at noon, in the evening, and at bedtime.   ipratropium (ATROVENT) 0.06 % nasal spray Place 2 sprays into both nostrils 4 (four) times daily. (Patient taking differently: Place 2 sprays into both nostrils 4 (four) times daily. As needed)   Lancets Misc. (ACCU-CHEK FASTCLIX LANCET) KIT Use to test blood sugar up to 3 times daily   levothyroxine (SYNTHROID) 150 MCG tablet TAKE ONE TABLET BY MOUTH BEFORE BREAKFAST   magnesium oxide (MAG-OX) 400 MG tablet TAKE ONE TABLET BY MOUTH EVERY MORNING   meclizine (ANTIVERT) 25 MG tablet Take 25 mg by mouth daily as needed for dizziness.   metoprolol succinate (TOPROL-XL) 25 MG 24 hr tablet Take 1 tablet (25 mg total) by  mouth daily. Take with or immediately following a meal.   nitroGLYCERIN (NITROSTAT) 0.4 MG SL tablet Place 1 tablet under tongue for chest pain. Repeat every five minutes 2 more times if needed for a total of 3 tablets within 15 minutes.   pantoprazole (PROTONIX) 20 MG tablet Take 1 tablet (20 mg total) by mouth daily.   Polyethylene Glycol 3350 (MIRALAX PO) Take 1 Package by mouth daily as needed (constipation).   potassium chloride SA (KLOR-CON M20) 20 MEQ tablet Take 1 tablet (20 mEq total) by mouth daily.   Propylene  Glycol (SYSTANE BALANCE OP) Place 1 drop into both eyes daily as needed (for dry eyes).   SALINE MIST SPRAY NA Place 2 sprays into the nose at bedtime.   Tafamidis 61 MG CAPS Take 61 mg by mouth daily.   torsemide (DEMADEX) 20 MG tablet Take 2 tablets (40 mg total) by mouth daily.   trolamine salicylate (ASPERCREME) 10 % cream Apply 1 application. topically as needed for muscle pain.   warfarin (COUMADIN) 5 MG tablet TAKE 1/2 TO 1 TABLET BY MOUTH ONCE daily OR AS DIRECTED by THE coumadin clinic   No facility-administered encounter medications on file as of 03/11/2022.   Reviewed chart for medication changes ahead of medication coordination call.  No OVs, Consults, or hospital visits since last care coordination call/Pharmacist visit. (If appropriate, list visit date, provider name)  No medication changes indicated OR if recent visit, treatment plan here.  BP Readings from Last 3 Encounters:  03/08/22 140/78  02/21/22 126/72  01/31/22 136/80    Lab Results  Component Value Date   HGBA1C 7.3 (A) 01/31/2022     Patient obtains medications through Adherence Packaging  30 Days    Last adherence delivery included: Gabapentin 600 mg tablet  1 tablet at breakfast and 1 tablet at dinner Metoprolol succinate 25 mg tablets 1 tablet at breakfast Allopurinol 100 mg 1 tablet at breakfast Atorvastatin 20 mg 1 tablet at bedtime Cetirizine 10 mg 1 tablet at breakfast (OTC med) Levothyroxine 150 mcg 1 tablet before breakfast Farxiga 10 mg 1 tablet at breakfast Magnesium oxide 400 mg 1 tablet at breakfast Warfarin 5 mg tablet (easy top vials) use as directed Torsemide 20 mg 2 tablets at breakfast and as needed  Nitrostat 0.86m - 1 tablet every 5 min as needed Klor-Con 20 meq - 1 tablet at breakfast Protonix 20 mg - 1 tablet at breakfast Aspirin 81 mg - 1 tablet at breakfast   Patient declined (meds) last month: ACCU CHEK test strips - use to check blood sugar 3 times daily - has plenty on  hand Humalog Kwikpen 100 un/ml - max daily 15 units  Toujeo Solostar 300 un/ml - Inject 26 Units into the skin daily Pen needles 8 mm x 31 g x 5/16 - pt states has plenty on hand Furosemide 80 mg - medication has been replaced with Torsemide.     Patient is due for next adherence delivery on: 03/24/2022   Called patient and reviewed medications and coordinated delivery.   This delivery to include: Gabapentin 600 mg tablet  1 tablet at breakfast and 1 tablet at bedtime Metoprolol succinate 25 mg tablets 1 tablet at breakfast Allopurinol 100 mg 1 tablet at breakfast Atorvastatin 20 mg 1 tablet at bedtime Cetirizine 10 mg 1 tablet at breakfast (OTC med) Levothyroxine 150 mcg 1 tablet before breakfast Farxiga 10 mg 1 tablet at breakfast Magnesium oxide 400 mg 1 tablet at breakfast Warfarin 5 mg tablet (  easy top vials) use as directed Torsemide 20 mg 2 tablets at breakfast  Nitrostat 0.60m - 1 tablet every 5 min as needed Klor-Con 20 meq - 1 tablet at breakfast Protonix 20 mg - 1 tablet at breakfast Aspirin 81 mg - 1 tablet at breakfast Humalog Kwikpen 100 un/ml - max daily 30 units Pen needles 8 mm x 32 g x 5/16 - up to 4 times daily   SCHEDULE FOLLOW UP  Patient will need a short fill: No short fills needed   Coordinated acute fill:    Declined the following medications:  Toujeo Solostar 300 un/ml - not due until 04/16/2022 per [facility] Dex[facility] - not due until 04/11/2022 per [facility]   Unable to reach patient to confirm delivery date of 03/24/2022 after several attempts. CCM deadline 02/14/2022.   Confirmed delivery date of 03/24/2022, advised patient that pharmacy will contact them the morning of delivery.   Care Gaps: AWV - scheduled for 02/16/22 Last A1C - 7.3 on 01/31/2022 Last BP - 136/80 on 01/31/2022 Shingrix - overdue  Star Rating Drugs: Atorvastatin 263m- Farxiga 1039m  [facility] Parkarmacist Assistant [phone]

## 2022-03-16 NOTE — Progress Notes (Unsigned)
HPI: [name] is a 81 y.o. female with hx of CAD,CKD III,HTN,CHF, atrial fib on chronic anticoagulation (coumadin), and amyloidosis here today with her daughter for follow up. She was last seen on 01/04/22, when she was c/o mid sternal chest discomfort, not exertional and heartburn, both exacerbated by certain foods.  She started Pantoprazole 20 mg daily. She is also on Pepcid 20 mg at bedtime. CP has greatly improved and heartburn has resolved.  Negative for abdominal pain, nausea, vomiting, changes in bowel habits, blood in stool or melena.  Bilateral neck pain for about 1-2 weeks. He woke up with pain, thought she may have slept "wrong. Improved with local heat. Pain was not radiated and exacerbated by palpation and come movements. No hx of trauma.  She has not had any fall since her last visit. Pending appt with outpt PT but she is having some problems with transportation due to her children's work schedule.  Parietal and occipital headache. This is a chronic problem, following with neurologist, has a f/u in a few days.  Since her last visit she has seen her endocrinologist and cardiologist.  Aortic atherosclerosis seen on imaging in chest CT in 02/2020. She is on Atorvastatin 20 mg daily and aspirin 81 mg daily.  Lab Results  Component Value Date   CHOL 115 08/13/2021   HDL 54.80 08/13/2021   LDLCALC 40 08/13/2021   TRIG 101.0 08/13/2021   CHOLHDL 2 08/13/2021   Bilateral ear pruritus, problem has been going on for a while. She has not tried OTC medications. Uses Q tips to scratch inside ear. No earache or changes in hearing. Scalp pruritus, problem has been going on for years, intermittently.She has not noted scalp lesions. She applied oil, which helps some.  Review of Systems  Constitutional:  Positive for fatigue. Negative for activity change, appetite change and fever.  HENT:  Negative for mouth sores, nosebleeds and trouble swallowing.   Eyes:   Negative for redness and visual disturbance.  Respiratory:  Negative for cough, shortness of breath and wheezing.   Genitourinary:  Negative for decreased urine volume and hematuria.  Skin:  Negative for rash.  Neurological:  Negative for syncope, facial asymmetry and weakness.  Rest see pertinent positives and negatives per HPI.  Current Outpatient Medications on File Prior to Visit  Medication Sig Dispense Refill   acetaminophen (TYLENOL) 500 MG tablet Take 1,000 mg by mouth every 6 (six) hours as needed for mild pain.     Alcohol Swabs (B-D SINGLE USE SWABS REGULAR) PADS Use to test blood sugar up to 3 times daily 100 each 3   allopurinol (ZYLOPRIM) 100 MG tablet TAKE ONE TABLET BY MOUTH EVERY MORNING 90 tablet 1   aspirin EC 81 MG EC tablet Take 1 tablet (81 mg total) by mouth daily. Swallow whole. 30 tablet 11   atorvastatin (LIPITOR) 20 MG tablet TAKE ONE TABLET BY MOUTH EVERYDAY AT BEDTIME 90 tablet 2   Blood Glucose Monitoring Suppl (ACCU-CHEK GUIDE) w/Device KIT 1 Device by Does not apply route 3 (three) times daily. 1 kit 0   Capsaicin 0.033 % CREA Apply 1 application topically 3 (three) times daily. (Patient taking differently: Apply 1 application. topically 3 (three) times daily. As needed) 56.6 g 11   Cholecalciferol (VITAMIN D3) 50 MCG (2000 UT) capsule Take 2,000 Units by mouth daily.     Continuous Blood Gluc Sensor (DEXCOM G6 SENSOR) MISC 1 Device by Does not apply route as directed. 9 each 3  Continuous Blood Gluc Transmit (DEXCOM G6 TRANSMITTER) MISC 1 Device by Does not apply route as directed. 1 each 3   famotidine (PEPCID) 20 MG tablet Take 1 tablet (20 mg total) by mouth at bedtime. 90 tablet 1   FARXIGA 10 MG TABS tablet Take 1 tablet (10 mg total) by mouth daily. 90 tablet 3   gabapentin (NEURONTIN) 600 MG tablet TAKE ONE TABLET BY MOUTH EVERY MORNING and TAKE ONE TABLET BY MOUTH EVERYDAY AT BEDTIME 60 tablet 3   glucose blood (ACCU-CHEK GUIDE) test strip Use to test  blood sugar up to 3 times daily 100 each 3   insulin glargine, 1 Unit Dial, (TOUJEO SOLOSTAR) 300 UNIT/ML Solostar Pen Inject 24 Units into the skin daily. Eat a snack with protein nightly before bedtime. 15 mL 6   insulin lispro (HUMALOG KWIKPEN) 100 UNIT/ML KwikPen Max daily 30 units 30 mL 3   Insulin Pen Needle 32G X 4 MM MISC 1 Device by Does not apply route in the morning, at noon, in the evening, and at bedtime. 400 each 3   ipratropium (ATROVENT) 0.06 % nasal spray Place 2 sprays into both nostrils 4 (four) times daily. (Patient taking differently: Place 2 sprays into both nostrils 4 (four) times daily. As needed) 15 mL 3   Lancets Misc. (ACCU-CHEK FASTCLIX LANCET) KIT Use to test blood sugar up to 3 times daily 1 kit 1   levothyroxine (SYNTHROID) 150 MCG tablet TAKE ONE TABLET BY MOUTH BEFORE BREAKFAST 90 tablet 1   magnesium oxide (MAG-OX) 400 MG tablet TAKE ONE TABLET BY MOUTH EVERY MORNING 90 tablet 1   meclizine (ANTIVERT) 25 MG tablet Take 25 mg by mouth daily as needed for dizziness.     metoprolol succinate (TOPROL-XL) 25 MG 24 hr tablet Take 1 tablet (25 mg total) by mouth daily. Take with or immediately following a meal. 90 tablet 3   nitroGLYCERIN (NITROSTAT) 0.4 MG SL tablet Place 1 tablet under tongue for chest pain. Repeat every five minutes 2 more times if needed for a total of 3 tablets within 15 minutes. 25 tablet 2   pantoprazole (PROTONIX) 20 MG tablet Take 1 tablet (20 mg total) by mouth daily. 30 tablet 2   Polyethylene Glycol 3350 (MIRALAX PO) Take 1 Package by mouth daily as needed (constipation).     potassium chloride SA (KLOR-CON M20) 20 MEQ tablet Take 1 tablet (20 mEq total) by mouth daily. 90 tablet 3   Propylene Glycol (SYSTANE BALANCE OP) Place 1 drop into both eyes daily as needed (for dry eyes).     SALINE MIST SPRAY NA Place 2 sprays into the nose at bedtime.     Tafamidis 61 MG CAPS Take 61 mg by mouth daily. 30 capsule 12   torsemide (DEMADEX) 20 MG tablet  Take 2 tablets (40 mg total) by mouth daily. 180 tablet 3   trolamine salicylate (ASPERCREME) 10 % cream Apply 1 application. topically as needed for muscle pain.     warfarin (COUMADIN) 5 MG tablet TAKE 1/2 TO 1 TABLET BY MOUTH ONCE daily OR AS DIRECTED by THE coumadin clinic 30 tablet 1   No current facility-administered medications on file prior to visit.    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 01/2015 Cardiolite: + inf wall ischemia, EF 56%;  b. 01/2015 Cath: LM nl, LAD 33m LCX min irregs, RCA dominant, 50-669m  Sleep apnea    Swallowing difficulty    Allergies  Allergen Reactions   Ace Inhibitors Other (See Comments)    unknown   Amlodipine Other (See Comments)    unknown   Atenolol Other (See Comments)    bradycardia   Avandia [Rosiglitazone] Other (See Comments)    unknown   Darvon [Propoxyphene] Other (See Comments)    unknown   Erythromycin Itching   Hydralazine Other (See Comments)    Burning in throat and chest   Hydrocodone Other (See Comments)    Hallucinations.   Levofloxacin Itching   Morphine And Related Other (See Comments)    Dizzy and hallucianation, vomiting; Willing to try low dose   Percocet [Oxycodone-Acetaminophen] Other (See Comments)    hallucination   Spironolactone Other (See Comments)    unknown   Tramadol Other (See Comments)    Unknown/does not recall reaction but does not want to take again   Social History   Socioeconomic History   Marital status: Widowed    Spouse name: Not on file   Number of children: Not on file   Years of education: Not on file    Highest education level: Not on file  Occupational History   Occupation: Retired  Tobacco Use   Smoking status: Never   Smokeless tobacco: Never  Vaping Use   Vaping Use: Never used  Substance and Sexual Activity   Alcohol use: No   Drug use: Never   Sexual activity: Not Currently  Other Topics Concern   Not on file  Social History Narrative   Lives with daughter   Social Determinants of Health   Financial Resource Strain: Low Risk    Difficulty of Paying Living Expenses: Not hard at all  Food Insecurity: No Food Insecurity   Worried About RuCharity fundraisern the Last Year: Never true   [place] the Last Year: Never true  Transportation Needs: No Transportation Needs   Lack of Transportation (Medical): No   Lack of Transportation (Non-Medical): No  Physical Activity: Inactive   Days of Exercise per Week: 0 days   Minutes of Exercise per Session: 0 min  Stress: No Stress Concern Present   Feeling of Stress : Not at all  Social Connections: Moderately Integrated   Frequency of Communication with Friends and Family: More than three times a week   Frequency of Social Gatherings with Friends and Family: More than three times a week   Attends Religious Services: More than 4 times per year   Active Member of [employer] Organizations: Yes   Attends ClArchiv[employer]ings: More than 4 times per year   Marital Status: Widowed   Vitals:   03/18/22 1054  BP: 128/62  Pulse: 90  Resp: 16  Temp: 99.2 F (37.3 C)  SpO2: 96%  Body mass index is 40.86 kg/m.  Physical Exam Vitals and nursing note reviewed.  Constitutional:      General: She is not in acute distress.    Appearance: She is well-developed.  HENT:     Head: Normocephalic and atraumatic.     Right Ear: Tympanic  membrane and external ear normal.     Left Ear: Tympanic membrane and external ear normal.     Ears:     Comments: Minimal dryness right ear canal , otherwise normal bilateral.     Mouth/Throat:     Mouth: Mucous membranes are moist.     Pharynx: Oropharynx is clear.  Eyes:     Conjunctiva/sclera: Conjunctivae normal.  Cardiovascular:     Rate and Rhythm: Normal rate. Rhythm irregular.     Heart sounds: No murmur heard. Pulmonary:     Effort: Pulmonary effort is normal. No respiratory distress.     Breath sounds: Normal breath sounds.  Abdominal:     Palpations: Abdomen is soft. There is no hepatomegaly or mass.     Tenderness: There is no abdominal tenderness.  Musculoskeletal:     Cervical back: Tenderness present. No erythema. No pain with movement or muscular tenderness. Decreased range of motion.       Back:  Lymphadenopathy:     Cervical: No cervical adenopathy.  Skin:    General: Skin is warm.     Findings: No erythema or rash.     Comments: Scalp with no rash.  Neurological:     General: No focal deficit present.     Mental Status: She is alert and oriented to person, place, and time.     Comments: Gait assisted by a cane.  Psychiatric:     Comments: Well groomed, good eye contact.   ASSESSMENT AND PLAN:  Ms. Carynn was seen today for follow-up.  Diagnoses and all orders for this visit:  Chronic non-infective otitis externa of both ears, unspecified type We discussed possible etiologies, ? Seborrheic dermatitis vs eczema. Topical steroid recommended, fluocinolone 0.01 % 1 drop q 2-3 days as needed. We discussed some side effects of chronic topical steroid use.  -     Fluocinolone Acetonide 0.01 % OIL; 1 drop in ears every 2-3 days as neeeded for pruritis.  Gastroesophageal reflux disease, unspecified whether esophagitis present Symptoms improved. Continue Pantoprazole 20 mg daily and Pepcid 20 mg at bedtime. GERD precautions to continue.  Neck pain Improved. Could be musculoskeletal. Icy hot or asper cream on affected area may help. Local heat can also be continued. ROM exercises.  Seborrheic dermatitis of scalp We discussed  Dx,prognosis,and treatment options. She agrees with trying Nizoral shampoo.  -     ketoconazole (NIZORAL) 2 % shampoo; Apply 1 application. topically 2 (two) times a week.  Atherosclerosis of aorta (HCC) On Atorvastatin 20 mg daily and Aspirin 81 mg daily.  I spent a total of 35 minutes in both face to face and non face to face activities for this visit on the date of this encounter. During this time history was obtained and documented, examination was performed, prior labs reviewed, and assessment/plan discussed.  Return in about 6 months (around 09/17/2022).  Thoren Hosang G. [name], MD  [facility]. [place] office.

## 2022-03-17 ENCOUNTER — Ambulatory Visit (INDEPENDENT_AMBULATORY_CARE_PROVIDER_SITE_OTHER): Payer: Medicare Other

## 2022-03-17 DIAGNOSIS — I10 Essential (primary) hypertension: Secondary | ICD-10-CM

## 2022-03-17 DIAGNOSIS — I2510 Atherosclerotic heart disease of native coronary artery without angina pectoris: Secondary | ICD-10-CM

## 2022-03-17 DIAGNOSIS — N184 Chronic kidney disease, stage 4 (severe): Secondary | ICD-10-CM

## 2022-03-17 DIAGNOSIS — E113499 Type 2 diabetes mellitus with severe nonproliferative diabetic retinopathy without macular edema, unspecified eye: Secondary | ICD-10-CM

## 2022-03-17 DIAGNOSIS — I4891 Unspecified atrial fibrillation: Secondary | ICD-10-CM

## 2022-03-17 DIAGNOSIS — I509 Heart failure, unspecified: Secondary | ICD-10-CM

## 2022-03-17 NOTE — Chronic Care Management (AMB) (Signed)
Chronic Care Management   CCM RN Visit Note  03/17/2022 Name: Regina [name] MRN: [id number] DOB: 1941-07-01  Subjective: Regina [name] is a 81 y.o. year old female who is a primary care patient of [name], Malka So, MD. The care management team was consulted for assistance with disease management and care coordination needs.    Engaged with patient by telephone for follow up visit in response to provider referral for case management and/or care coordination services.   Consent to Services:  The patient was given information about Chronic Care Management services, agreed to services, and gave verbal consent prior to initiation of services.  Please see initial visit note for detailed documentation.   Patient agreed to services and verbal consent obtained.   Assessment: Review of patient past medical history, allergies, medications, health status, including review of consultants reports, laboratory and other test data, was performed as part of comprehensive evaluation and provision of chronic care management services.   SDOH (Social Determinants of Health) assessments and interventions performed:    CCM Care Plan  Allergies  Allergen Reactions   Ace Inhibitors Other (See Comments)    unknown   Amlodipine Other (See Comments)    unknown   Atenolol Other (See Comments)    bradycardia   Avandia [Rosiglitazone] Other (See Comments)    unknown   Darvon [Propoxyphene] Other (See Comments)    unknown   Erythromycin Itching   Hydralazine Other (See Comments)    Burning in throat and chest   Hydrocodone Other (See Comments)    Hallucinations.   Levofloxacin Itching   Morphine And Related Other (See Comments)    Dizzy and hallucianation, vomiting; Willing to try low dose   Percocet [Oxycodone-Acetaminophen] Other (See Comments)    hallucination   Spironolactone Other (See Comments)    unknown   Tramadol Other (See Comments)    Unknown/does not recall reaction but does not  want to take again    Outpatient Encounter Medications as of 03/17/2022  Medication Sig   acetaminophen (TYLENOL) 500 MG tablet Take 1,000 mg by mouth every 6 (six) hours as needed for mild pain.   Alcohol Swabs (B-D SINGLE USE SWABS REGULAR) PADS Use to test blood sugar up to 3 times daily   allopurinol (ZYLOPRIM) 100 MG tablet TAKE ONE TABLET BY MOUTH EVERY MORNING   aspirin EC 81 MG EC tablet Take 1 tablet (81 mg total) by mouth daily. Swallow whole.   atorvastatin (LIPITOR) 20 MG tablet TAKE ONE TABLET BY MOUTH EVERYDAY AT BEDTIME   Blood Glucose Monitoring Suppl (ACCU-CHEK GUIDE) w/Device KIT 1 Device by Does not apply route 3 (three) times daily.   Capsaicin 0.033 % CREA Apply 1 application topically 3 (three) times daily. (Patient taking differently: Apply 1 application. topically 3 (three) times daily. As needed)   Cholecalciferol (VITAMIN D3) 50 MCG (2000 UT) capsule Take 2,000 Units by mouth daily.   Continuous Blood Gluc Sensor (DEXCOM G6 SENSOR) MISC 1 Device by Does not apply route as directed.   Continuous Blood Gluc Transmit (DEXCOM G6 TRANSMITTER) MISC 1 Device by Does not apply route as directed.   famotidine (PEPCID) 20 MG tablet Take 1 tablet (20 mg total) by mouth at bedtime.   FARXIGA 10 MG TABS tablet Take 1 tablet (10 mg total) by mouth daily.   gabapentin (NEURONTIN) 600 MG tablet TAKE ONE TABLET BY MOUTH EVERY MORNING and TAKE ONE TABLET BY MOUTH EVERYDAY AT BEDTIME   glucose blood (ACCU-CHEK GUIDE) test  strip Use to test blood sugar up to 3 times daily   insulin glargine, 1 Unit Dial, (TOUJEO SOLOSTAR) 300 UNIT/ML Solostar Pen Inject 24 Units into the skin daily. Eat a snack with protein nightly before bedtime.   insulin lispro (HUMALOG KWIKPEN) 100 UNIT/ML KwikPen Max daily 30 units   Insulin Pen Needle 32G X 4 MM MISC 1 Device by Does not apply route in the morning, at noon, in the evening, and at bedtime.   ipratropium (ATROVENT) 0.06 % nasal spray Place 2 sprays into  both nostrils 4 (four) times daily. (Patient taking differently: Place 2 sprays into both nostrils 4 (four) times daily. As needed)   Lancets Misc. (ACCU-CHEK FASTCLIX LANCET) KIT Use to test blood sugar up to 3 times daily   levothyroxine (SYNTHROID) 150 MCG tablet TAKE ONE TABLET BY MOUTH BEFORE BREAKFAST   magnesium oxide (MAG-OX) 400 MG tablet TAKE ONE TABLET BY MOUTH EVERY MORNING   meclizine (ANTIVERT) 25 MG tablet Take 25 mg by mouth daily as needed for dizziness.   metoprolol succinate (TOPROL-XL) 25 MG 24 hr tablet Take 1 tablet (25 mg total) by mouth daily. Take with or immediately following a meal.   nitroGLYCERIN (NITROSTAT) 0.4 MG SL tablet Place 1 tablet under tongue for chest pain. Repeat every five minutes 2 more times if needed for a total of 3 tablets within 15 minutes.   pantoprazole (PROTONIX) 20 MG tablet Take 1 tablet (20 mg total) by mouth daily.   Polyethylene Glycol 3350 (MIRALAX PO) Take 1 Package by mouth daily as needed (constipation).   potassium chloride SA (KLOR-CON M20) 20 MEQ tablet Take 1 tablet (20 mEq total) by mouth daily.   Propylene Glycol (SYSTANE BALANCE OP) Place 1 drop into both eyes daily as needed (for dry eyes).   SALINE MIST SPRAY NA Place 2 sprays into the nose at bedtime.   Tafamidis 61 MG CAPS Take 61 mg by mouth daily.   torsemide (DEMADEX) 20 MG tablet Take 2 tablets (40 mg total) by mouth daily.   trolamine salicylate (ASPERCREME) 10 % cream Apply 1 application. topically as needed for muscle pain.   warfarin (COUMADIN) 5 MG tablet TAKE 1/2 TO 1 TABLET BY MOUTH ONCE daily OR AS DIRECTED by THE coumadin clinic   No facility-administered encounter medications on file as of 03/17/2022.    Patient Active Problem List   Diagnosis Date Noted   Pain due to onychomycosis of toenail of left foot 02/11/2022   Chest pain 12/26/2021   Left atrial thrombus 12/25/2021   Wild-type transthyretin-related (ATTR) amyloidosis (HCC)    Lung nodules 12/13/2021    Polymyalgia rheumatica ([place]) 06/30/2021   Neck pain 06/18/2021   Gait abnormality 06/18/2021   Memory loss 06/18/2021   Atherosclerosis of aorta ([place]) 06/09/2021   Type 2 diabetes mellitus with stage 3a chronic kidney disease, with long-term current use of insulin ([place]) 09/14/2020   Type 2 diabetes mellitus with retinopathy, with long-term current use of insulin ([place]) 09/14/2020   Diabetes mellitus ([place]) 09/14/2020   Type 2 diabetes mellitus with diabetic polyneuropathy, with long-term current use of insulin ([place]) 09/14/2020   Long term (current) use of anticoagulants 07/13/2020   Persistent atrial fibrillation (HCC)    Mixed hyperlipidemia    Acute on chronic diastolic CHF (congestive heart failure), NYHA class 3 ([place])    Demand ischemia ([place])    Atrial fibrillation ([place]) 04/24/2020   Other fatigue 03/24/2020   Short of breath on exertion 03/24/2020  Congestive heart failure ([place]) 03/24/2020   Vitamin D deficiency 03/24/2020   Sleep apnea 05/21/2019   History of hepatitis C 05/21/2019   Insomnia 05/21/2019   CKD (chronic kidney disease) stage 4, GFR 15-29 ml/min (HCC) 05/21/2019   GERD (gastroesophageal reflux disease) 04/20/2018   Type 2 diabetes mellitus with diabetic neuropathy, unspecified ([place]) 12/19/2016   History of TIA (transient ischemic attack) 12/19/2016   Hyperlipidemia associated with type 2 diabetes mellitus ([place]) 12/19/2016   S/P total knee replacement using cement, right 03/18/2015   Non-obstructive CAD    Cardiovascular stress test abnormal    Pleuritic chest pain 01/16/2015   Acute renal failure superimposed on stage 3 chronic kidney disease ([place]) 01/16/2015   Obesity, Class III, BMI 40-49.9 (morbid obesity) ([place]) 01/16/2015   Essential hypertension 01/16/2015   Hypothyroidism 01/16/2015   OSA on CPAP 01/16/2015   DM type 2 (diabetes mellitus, type 2) ([place]) 01/16/2015   Coronary artery disease involving native coronary artery without angina pectoris  10/02/2014   Physical deconditioning 02/28/2013   Morbid obesity ([place]) 02/27/2013   Osteoarthritis 02/27/2013    Conditions to be addressed/monitored:Atrial Fibrillation, CHF, CAD, HTN, HLD, DMII, and CKD Stage 4  Care Plan : RN Care Manager Plan of Care  Updates made by Dimitri Ped, RN since 03/17/2022 12:00 AM     Problem: Chronic Disease Management and Care Coordination Needs (CHF,CAD,DM2 HTN,HLD, Atrial Fib, CKD4)   Priority: High     Long-Range Goal: Establish Plan of Care for Chronic Disease Management Needs (CHF,CAD,DM2 HTN,HLD, Atrial Fib, CKD4)   Start Date: 12/30/2021  Expected End Date: 12/30/2022  Priority: High  Note:   Current Barriers:  Knowledge Deficits related to plan of care for management of Atrial Fibrillation, CHF, CAD, HTN, HLD, DMII, and CKD Stage 4  Chronic Disease Management support and education needs related to Atrial Fibrillation, CHF, CAD, HTN, HLD, DMII, and CKD Stage 4  States she is feeling stronger.   States she is weighting every day and her weight was 221.1 today.  Denies any swelling, chest pains or shortness of breath.  States her B/P reading have been good around 110-120/50-80.  State her blood sugars range from 117-140 in the morning and can be in the 200's right after eating.   Denies any hypoglycemia.  States she saw the endocrinolost and she cut back her Toujeo and increased her meal time insulin to 7 units but states she is taking 5 units at meal time because 7 units will make her go too low.  States she tries to follow a low sodium diabetes diet. States she is using her cane and denies any recent falls.    RNCM Clinical Goal(s):  Patient will verbalize understanding of plan for management of Atrial Fibrillation, CHF, CAD, HTN, HLD, DMII, and CKD Stage 4 as evidenced by voiced adherence to plan of care verbalize basic understanding of  Atrial Fibrillation, CHF, CAD, HTN, HLD, and DMII disease process and self health management plan as  evidenced by voiced understanding and teach back take all medications exactly as prescribed and will call provider for medication related questions as evidenced by dispense report and pt verbalization  attend all scheduled medical appointments: Dr. [name] 03/18/22, Coumadin clinic 03/25/22, CCM PharmD 06/01/22,Annual Wellness Visit 02/20/23, Podiatry 05/18/22, Neurology 04/07/22,cardiology 07/08/22, as evidenced by medical records demonstrate Improved adherence to prescribed treatment plan for Atrial Fibrillation, CHF, CAD, HTN, HLD, DMII, and CKD Stage 4 as evidenced by readings within limits, voiced adherence to plan  of care continue to work with RN Care Manager to address care management and care coordination needs related to  Atrial Fibrillation, CHF, CAD, HTN, HLD, DMII, and CKD Stage 4 as evidenced by adherence to CM Team Scheduled appointments through collaboration with RN Care manager, provider, and care team.   Interventions: 1:1 collaboration with primary care provider regarding development and update of comprehensive plan of care as evidenced by provider attestation and co-signature Inter-disciplinary care team collaboration (see longitudinal plan of care) Evaluation of current treatment plan related to  self management and patient's adherence to plan as established by provider   AFIB Interventions: (Status:  Goal on track:  Yes.) Long Term Goal   Counseled on increased risk of stroke due to Afib and benefits of anticoagulation for stroke prevention Reviewed importance of adherence to anticoagulant exactly as prescribed Counseled on bleeding risk associated with Coumadin and importance of self-monitoring for signs/symptoms of bleeding Counseled on importance of regular laboratory monitoring as prescribed Reviewed foods to limit while taking Coumadin. Reinforced home safety and fall precautions when taking Coumadin   CAD Interventions: (Status:  Goal on track:  Yes.) Long Term Goal Assessed  understanding of CAD diagnosis Medications reviewed including medications utilized in CAD treatment plan Provided education on importance of blood pressure control in management of CAD Provided education on Importance of limiting foods high in cholesterol Reviewed Importance of taking all medications as prescribed Reviewed Importance of attending all scheduled provider appointments Advised to report any changes in symptoms or exercise tolerance Reviewed signs and symptoms to call doctor and when to call 911   Heart Failure Interventions:  (Status:  Goal on track:  Yes.) Long Term Goal Basic overview and discussion of pathophysiology of Heart Failure reviewed Provided education on low sodium diet Reviewed Heart Failure Action Plan in depth and provided written copy Discussed importance of daily weight and advised patient to weigh and record daily Reviewed role of diuretics in prevention of fluid overload and management of heart failure; Discussed the importance of keeping all appointments with provider Reinforced s/sx to call provider and when to take extra diuretic    Chronic Kidney Disease Interventions:  (Status:  Goal on track:  Yes.) Long Term Goal Assessed the Patient understanding of chronic kidney disease    Evaluation of current treatment plan related to chronic kidney disease self management and patient's adherence to plan as established by provider      Reviewed medications with patient and discussed importance of compliance    Discussed complications of poorly controlled blood pressure such as heart disease, stroke, circulatory complications, vision complications, kidney impairment, sexual dysfunction    Discussed the impact of chronic kidney disease on daily life and mental health and acknowledged and normalized feelings of disempowerment, fear, and frustration    Last practice recorded BP readings:  BP Readings from Last 3 Encounters:  03/08/22 140/78  02/21/22 126/72   01/31/22 136/80  Most recent eGFR/CrCl:  Lab Results  Component Value Date   EGFR 25 (L) 10/29/2021    No components found for: CRCL    Diabetes Interventions:  (Status:  Goal on track:  Yes.) Long Term Goal Assessed patient's understanding of A1c goal:  <7.5% Provided education to patient about basic DM disease process Reviewed medications with patient and discussed importance of medication adherence Counseled on importance of regular laboratory monitoring as prescribed Discussed plans with patient for ongoing care management follow up and provided patient with direct contact information for care management team Provided  patient with written educational materials related to hypo and hyperglycemia and importance of correct treatment Advised patient, providing education and rationale, to check cbg 3-4 times daily and record, calling provider for findings outside established parameters Reviewed to try to eat snacks with protein and lower sodium. Reinforced the rule of 15 and to call endocrinologist if she has frequent lows Lab Results  Component Value Date   HGBA1C 7.3 (A) 01/31/2022   Hyperlipidemia Interventions:  (Status:  Condition stable.  Not addressed this visit.) Long Term Goal Medication review performed; medication list updated in electronic medical record.  Provider established cholesterol goals reviewed Counseled on importance of regular laboratory monitoring as prescribed Reviewed role and benefits of statin for ASCVD risk reduction Reviewed importance of limiting foods high in cholesterol  Hypertension Interventions:  (Status:  Goal on track:  Yes.) Long Term Goal Last practice recorded BP readings:  BP Readings from Last 3 Encounters:  03/08/22 140/78  02/21/22 126/72  01/31/22 136/80  Most recent eGFR/CrCl:  Lab Results  Component Value Date   EGFR 25 (L) 10/29/2021    No components found for: CRCL  Evaluation of current treatment plan related to hypertension  self management and patient's adherence to plan as established by provider Provided education to patient re: stroke prevention, s/s of heart attack and stroke Reviewed medications with patient and discussed importance of compliance Counseled on adverse effects of illicit drug and excessive alcohol use in patients with high blood pressure  Discussed plans with patient for ongoing care management follow up and provided patient with direct contact information for care management team Advised patient, providing education and rationale, to monitor blood pressure daily and record, calling PCP for findings outside established parameters Provided education on prescribed diet low sodium. Low CHO heart healthy Reviewed to try to increase activity as tolerated  Patient Goals/Self-Care Activities: Take all medications as prescribed Attend all scheduled provider appointments Call pharmacy for medication refills 3-7 days in advance of running out of medications Call provider office for new concerns or questions  call office if I gain more than 2 pounds in one day or 5 pounds in one week keep legs up while sitting track weight in diary weigh myself daily follow rescue plan if symptoms flare-up eat more whole grains, fruits and vegetables, lean meats and healthy fats keep appointment with eye doctor check blood sugar at prescribed times: three times daily and when you have symptoms of low or high blood sugar fill half of plate with vegetables manage portion size Try eating protein with snacks check pulse (heart) rate once a day make a plan to eat healthy keep all lab appointments take medicine as prescribed check blood pressure daily choose a place to take my blood pressure (home, clinic or office, retail store) keep a blood pressure log take blood pressure log to all doctor appointments call for medicine refill 2 or 3 days before it runs out take all medications exactly as prescribed call doctor  with any symptoms you believe are related to your medicine  Follow Up Plan:  Telephone follow up appointment with care management team member scheduled for:  04/21/22 The patient has been provided with contact information for the care management team and has been advised to call with any health related questions or concerns.       Plan:Telephone follow up appointment with care management team member scheduled for:  04/21/22 The patient has been provided with contact information for the care management team and has been  advised to call with any health related questions or concerns.  Peter Garter RN, Jackquline [place], CDE Care Management Coordinator [facility] [phone]

## 2022-03-17 NOTE — Patient Instructions (Signed)
Visit Information  Thank you for taking time to visit with me today. Please don't hesitate to contact me if I can be of assistance to you before our next scheduled telephone appointment.  Following are the goals we discussed today:  Take all medications as prescribed Attend all scheduled provider appointments Call pharmacy for medication refills 3-7 days in advance of running out of medications Call provider office for new concerns or questions  call office if I gain more than 2 pounds in one day or 5 pounds in one week keep legs up while sitting track weight in diary weigh myself daily follow rescue plan if symptoms flare-up eat more whole grains, fruits and vegetables, lean meats and healthy fats keep appointment with eye doctor check blood sugar at prescribed times: three times daily and when you have symptoms of low or high blood sugar fill half of plate with vegetables manage portion size Try eating protein with snacks check pulse (heart) rate once a day make a plan to eat healthy keep all lab appointments take medicine as prescribed check blood pressure daily choose a place to take my blood pressure (home, clinic or office, retail store) keep a blood pressure log take blood pressure log to all doctor appointments call for medicine refill 2 or 3 days before it runs out take all medications exactly as prescribed call doctor with any symptoms you believe are related to your medicine  Our next appointment is by telephone on 04/20/22 at 10 AM  Please call the care guide team at [phone] if you need to cancel or reschedule your appointment.   If you are experiencing a Mental Health or [place] or need someone to talk to, please call the Suicide and Crisis Lifeline: 988 call the [place] National Suicide Prevention Lifeline: 347 229 9789 or TTY: (475) 833-0842 TTY 2013467875) to talk to a trained counselor call 1-800-273-TALK (toll free, 24 hour hotline) go to  [facility] [address], [place] [phone]) call 911   Patient verbalizes understanding of instructions and care plan provided today and agrees to view in [place]. Active MyChart status and patient understanding of how to access instructions and care plan via MyChart confirmed with patient.    Peter Garter RN, Jackquline [place], CDE Care Management Coordinator [facility] [phone]

## 2022-03-18 ENCOUNTER — Ambulatory Visit (INDEPENDENT_AMBULATORY_CARE_PROVIDER_SITE_OTHER): Payer: Medicare Other | Admitting: Family Medicine

## 2022-03-18 ENCOUNTER — Encounter: Payer: Self-pay | Admitting: Family Medicine

## 2022-03-18 VITALS — BP 128/62 | HR 90 | Temp 99.2°F | Resp 16 | Ht 62.0 in | Wt 223.4 lb

## 2022-03-18 DIAGNOSIS — K219 Gastro-esophageal reflux disease without esophagitis: Secondary | ICD-10-CM

## 2022-03-18 DIAGNOSIS — L219 Seborrheic dermatitis, unspecified: Secondary | ICD-10-CM | POA: Diagnosis not present

## 2022-03-18 DIAGNOSIS — M542 Cervicalgia: Secondary | ICD-10-CM | POA: Diagnosis not present

## 2022-03-18 DIAGNOSIS — I700 Atherosclerosis of aorta: Secondary | ICD-10-CM

## 2022-03-18 DIAGNOSIS — H6063 Unspecified chronic otitis externa, bilateral: Secondary | ICD-10-CM | POA: Diagnosis not present

## 2022-03-18 MED ORDER — KETOCONAZOLE 2 % EX SHAM: 1.0000 "application " | MEDICATED_SHAMPOO | CUTANEOUS | 2 refills | Status: DC

## 2022-03-18 MED ORDER — FLUOCINOLONE ACETONIDE 0.01 % OT OIL: TOPICAL_OIL | OTIC | 0 refills | Status: DC

## 2022-03-18 NOTE — Patient Instructions (Addendum)
A few things to remember from today's visit:   Chronic non-infective otitis externa of both ears, unspecified type - Plan: Fluocinolone Acetonide 0.01 % OIL  Gastroesophageal reflux disease, unspecified whether esophagitis present  Neck pain  Seborrheic dermatitis of scalp - Plan: ketoconazole (NIZORAL) 2 % shampoo  Neck pain seems muscular. Local heat to continue as needed. Tylenol 500 mg 3-4 times per day.  For ears drops to use every 2-3 days as needed. No changes in pantoprazole. Keep appt with neurologist for headaches.  If you need refills please call your pharmacy. Do not use My Chart to request refills or for acute issues that need immediate attention.    Please be sure medication list is accurate. If a new problem present, please set up appointment sooner than planned today.

## 2022-03-25 ENCOUNTER — Ambulatory Visit (INDEPENDENT_AMBULATORY_CARE_PROVIDER_SITE_OTHER): Payer: Medicare Other | Admitting: *Deleted

## 2022-03-25 DIAGNOSIS — I4891 Unspecified atrial fibrillation: Secondary | ICD-10-CM | POA: Diagnosis not present

## 2022-03-25 DIAGNOSIS — Z7901 Long term (current) use of anticoagulants: Secondary | ICD-10-CM

## 2022-03-25 DIAGNOSIS — I4819 Other persistent atrial fibrillation: Secondary | ICD-10-CM | POA: Diagnosis not present

## 2022-03-25 LAB — POCT INR: INR: 2.1 (ref 2.0–3.0)

## 2022-03-25 NOTE — Patient Instructions (Signed)
Description   Continue taking 1/2 tablet daily. Repeat INR in 4 weeks; Anticoagulation Clinic [phone]

## 2022-03-30 DIAGNOSIS — G4733 Obstructive sleep apnea (adult) (pediatric): Secondary | ICD-10-CM | POA: Diagnosis not present

## 2022-04-07 ENCOUNTER — Ambulatory Visit: Payer: Medicare Other | Admitting: Neurology

## 2022-04-11 ENCOUNTER — Other Ambulatory Visit: Payer: Self-pay | Admitting: Cardiovascular Disease

## 2022-04-11 ENCOUNTER — Other Ambulatory Visit: Payer: Self-pay | Admitting: Family Medicine

## 2022-04-11 ENCOUNTER — Telehealth: Payer: Self-pay | Admitting: Pharmacist

## 2022-04-11 DIAGNOSIS — H6063 Unspecified chronic otitis externa, bilateral: Secondary | ICD-10-CM

## 2022-04-15 DIAGNOSIS — I4891 Unspecified atrial fibrillation: Secondary | ICD-10-CM | POA: Diagnosis not present

## 2022-04-15 DIAGNOSIS — N1831 Chronic kidney disease, stage 3a: Secondary | ICD-10-CM

## 2022-04-15 DIAGNOSIS — I509 Heart failure, unspecified: Secondary | ICD-10-CM

## 2022-04-15 DIAGNOSIS — E1122 Type 2 diabetes mellitus with diabetic chronic kidney disease: Secondary | ICD-10-CM | POA: Diagnosis not present

## 2022-04-15 DIAGNOSIS — E785 Hyperlipidemia, unspecified: Secondary | ICD-10-CM | POA: Diagnosis not present

## 2022-04-15 DIAGNOSIS — I2510 Atherosclerotic heart disease of native coronary artery without angina pectoris: Secondary | ICD-10-CM

## 2022-04-15 DIAGNOSIS — Z794 Long term (current) use of insulin: Secondary | ICD-10-CM

## 2022-04-15 DIAGNOSIS — I130 Hypertensive heart and chronic kidney disease with heart failure and stage 1 through stage 4 chronic kidney disease, or unspecified chronic kidney disease: Secondary | ICD-10-CM

## 2022-04-15 DIAGNOSIS — E1159 Type 2 diabetes mellitus with other circulatory complications: Secondary | ICD-10-CM

## 2022-04-21 ENCOUNTER — Ambulatory Visit (INDEPENDENT_AMBULATORY_CARE_PROVIDER_SITE_OTHER): Payer: Medicare Other

## 2022-04-21 DIAGNOSIS — I509 Heart failure, unspecified: Secondary | ICD-10-CM

## 2022-04-21 DIAGNOSIS — I4891 Unspecified atrial fibrillation: Secondary | ICD-10-CM

## 2022-04-21 DIAGNOSIS — N184 Chronic kidney disease, stage 4 (severe): Secondary | ICD-10-CM

## 2022-04-21 DIAGNOSIS — I2510 Atherosclerotic heart disease of native coronary artery without angina pectoris: Secondary | ICD-10-CM

## 2022-04-21 DIAGNOSIS — I10 Essential (primary) hypertension: Secondary | ICD-10-CM

## 2022-04-21 DIAGNOSIS — E113499 Type 2 diabetes mellitus with severe nonproliferative diabetic retinopathy without macular edema, unspecified eye: Secondary | ICD-10-CM

## 2022-04-21 NOTE — Patient Instructions (Signed)
Visit Information  Thank you for taking time to visit with me today. Please don't hesitate to contact me if I can be of assistance to you before our next scheduled telephone appointment.  Following are the goals we discussed today:  Take all medications as prescribed Attend all scheduled provider appointments Call pharmacy for medication refills 3-7 days in advance of running out of medications Call provider office for new concerns or questions  call office if I gain more than 2 pounds in one day or 5 pounds in one week keep legs up while sitting track weight in diary weigh myself daily follow rescue plan if symptoms flare-up eat more whole grains, fruits and vegetables, lean meats and healthy fats keep appointment with eye doctor check blood sugar at prescribed times: three times daily and when you have symptoms of low or high blood sugar fill half of plate with vegetables manage portion size Try eating protein with snacks check pulse (heart) rate once a day make a plan to eat healthy keep all lab appointments take medicine as prescribed check blood pressure daily choose a place to take my blood pressure (home, clinic or office, retail store) keep a blood pressure log take blood pressure log to all doctor appointments call for medicine refill 2 or 3 days before it runs out take all medications exactly as prescribed call doctor with any symptoms you believe are related to your medicine  Our next appointment is by telephone on 05/24/22 at 10 AM  Please call the care guide team at [phone] if you need to cancel or reschedule your appointment.   If you are experiencing a Mental Health or [place] or need someone to talk to, please call the Suicide and Crisis Lifeline: 988 call the [place] National Suicide Prevention Lifeline: 223-801-5442 or TTY: 475-096-8158 TTY 819-154-0233) to talk to a trained counselor call 1-800-273-TALK (toll free, 24 hour hotline) go to  [facility] [address] [place] [phone]) call 911   Patient verbalizes understanding of instructions and care plan provided today and agrees to view in [place]. Active MyChart status and patient understanding of how to access instructions and care plan via MyChart confirmed with patient.    Peter Garter RN, Jackquline [place], CDE Care Management Coordinator [facility] [phone]

## 2022-04-21 NOTE — Chronic Care Management (AMB) (Signed)
Chronic Care Management   CCM RN Visit Note  04/21/2022 Name: Regina [name] MRN: [id number] DOB: 10/07/1941  Subjective: Regina [name] is a 81 y.o. year old female who is a primary care patient of [name], Malka So, MD. The care management team was consulted for assistance with disease management and care coordination needs.    Engaged with patient by telephone for follow up visit in response to provider referral for case management and/or care coordination services.   Consent to Services:  The patient was given information about Chronic Care Management services, agreed to services, and gave verbal consent prior to initiation of services.  Please see initial visit note for detailed documentation.   Patient agreed to services and verbal consent obtained.   Assessment: Review of patient past medical history, allergies, medications, health status, including review of consultants reports, laboratory and other test data, was performed as part of comprehensive evaluation and provision of chronic care management services.   SDOH (Social Determinants of Health) assessments and interventions performed:    CCM Care Plan  Allergies  Allergen Reactions   Ace Inhibitors Other (See Comments)    unknown   Amlodipine Other (See Comments)    unknown   Atenolol Other (See Comments)    bradycardia   Avandia [Rosiglitazone] Other (See Comments)    unknown   Darvon [Propoxyphene] Other (See Comments)    unknown   Erythromycin Itching   Hydralazine Other (See Comments)    Burning in throat and chest   Hydrocodone Other (See Comments)    Hallucinations.   Levofloxacin Itching   Morphine And Related Other (See Comments)    Dizzy and hallucianation, vomiting; Willing to try low dose   Percocet [Oxycodone-Acetaminophen] Other (See Comments)    hallucination   Spironolactone Other (See Comments)    unknown   Tramadol Other (See Comments)    Unknown/does not recall reaction but does not  want to take again    Outpatient Encounter Medications as of 04/21/2022  Medication Sig   acetaminophen (TYLENOL) 500 MG tablet Take 1,000 mg by mouth every 6 (six) hours as needed for mild pain.   Alcohol Swabs (B-D SINGLE USE SWABS REGULAR) PADS Use to test blood sugar up to 3 times daily   allopurinol (ZYLOPRIM) 100 MG tablet TAKE ONE TABLET BY MOUTH EVERY MORNING   aspirin EC 81 MG EC tablet Take 1 tablet (81 mg total) by mouth daily. Swallow whole.   atorvastatin (LIPITOR) 20 MG tablet TAKE ONE TABLET BY MOUTH EVERYDAY AT BEDTIME   Blood Glucose Monitoring Suppl (ACCU-CHEK GUIDE) w/Device KIT 1 Device by Does not apply route 3 (three) times daily.   Capsaicin 0.033 % CREA Apply 1 application topically 3 (three) times daily. (Patient taking differently: Apply 1 application. topically 3 (three) times daily. As needed)   Cholecalciferol (VITAMIN D3) 50 MCG (2000 UT) capsule Take 2,000 Units by mouth daily.   Continuous Blood Gluc Sensor (DEXCOM G6 SENSOR) MISC 1 Device by Does not apply route as directed.   Continuous Blood Gluc Transmit (DEXCOM G6 TRANSMITTER) MISC 1 Device by Does not apply route as directed.   famotidine (PEPCID) 20 MG tablet Take 1 tablet (20 mg total) by mouth at bedtime.   FARXIGA 10 MG TABS tablet TAKE ONE TABLET BY MOUTH ONCE DAILY   Fluocinolone Acetonide 0.01 % OIL instill one drop in ears every 2-3 DAYS AS NEEDED FOR pruritis   gabapentin (NEURONTIN) 600 MG tablet TAKE ONE TABLET BY MOUTH EVERY  MORNING and TAKE ONE TABLET BY MOUTH EVERYDAY AT BEDTIME   glucose blood (ACCU-CHEK GUIDE) test strip Use to test blood sugar up to 3 times daily   insulin glargine, 1 Unit Dial, (TOUJEO SOLOSTAR) 300 UNIT/ML Solostar Pen Inject 24 Units into the skin daily. Eat a snack with protein nightly before bedtime.   insulin lispro (HUMALOG KWIKPEN) 100 UNIT/ML KwikPen Max daily 30 units   Insulin Pen Needle 32G X 4 MM MISC 1 Device by Does not apply route in the morning, at noon, in  the evening, and at bedtime.   ipratropium (ATROVENT) 0.06 % nasal spray Place 2 sprays into both nostrils 4 (four) times daily. (Patient taking differently: Place 2 sprays into both nostrils 4 (four) times daily. As needed)   ketoconazole (NIZORAL) 2 % shampoo Apply 1 application. topically 2 (two) times a week.   Lancets Misc. (ACCU-CHEK FASTCLIX LANCET) KIT Use to test blood sugar up to 3 times daily   levothyroxine (SYNTHROID) 150 MCG tablet TAKE ONE TABLET BY MOUTH BEFORE BREAKFAST   magnesium oxide (MAG-OX) 400 MG tablet TAKE ONE TABLET BY MOUTH EVERY MORNING   meclizine (ANTIVERT) 25 MG tablet Take 25 mg by mouth daily as needed for dizziness.   metoprolol succinate (TOPROL-XL) 25 MG 24 hr tablet Take 1 tablet (25 mg total) by mouth daily. Take with or immediately following a meal.   nitroGLYCERIN (NITROSTAT) 0.4 MG SL tablet Dissolve 1 tab under tongue as needed for chest pain. May repeat every 5 minutes x 2 doses. If no relief call 9-1-1.   pantoprazole (PROTONIX) 20 MG tablet Take 1 tablet (20 mg total) by mouth daily.   Polyethylene Glycol 3350 (MIRALAX PO) Take 1 Package by mouth daily as needed (constipation).   potassium chloride SA (KLOR-CON M20) 20 MEQ tablet Take 1 tablet (20 mEq total) by mouth daily.   Propylene Glycol (SYSTANE BALANCE OP) Place 1 drop into both eyes daily as needed (for dry eyes).   SALINE MIST SPRAY NA Place 2 sprays into the nose at bedtime.   Tafamidis 61 MG CAPS Take 61 mg by mouth daily.   torsemide (DEMADEX) 20 MG tablet Take 2 tablets (40 mg total) by mouth daily.   trolamine salicylate (ASPERCREME) 10 % cream Apply 1 application. topically as needed for muscle pain.   warfarin (COUMADIN) 5 MG tablet TAKE 1/2 TO 1 TABLET BY MOUTH ONCE daily OR AS DIRECTED by THE coumadin clinic   No facility-administered encounter medications on file as of 04/21/2022.    Patient Active Problem List   Diagnosis Date Noted   Pain due to onychomycosis of toenail of left  foot 02/11/2022   Chest pain 12/26/2021   Left atrial thrombus 12/25/2021   Wild-type transthyretin-related (ATTR) amyloidosis (HCC)    Lung nodules 12/13/2021   Polymyalgia rheumatica ([place]) 06/30/2021   Neck pain 06/18/2021   Gait abnormality 06/18/2021   Memory loss 06/18/2021   Atherosclerosis of aorta ([place]) 06/09/2021   Type 2 diabetes mellitus with stage 3a chronic kidney disease, with long-term current use of insulin ([place]) 09/14/2020   Type 2 diabetes mellitus with retinopathy, with long-term current use of insulin ([place]) 09/14/2020   Diabetes mellitus ([place]) 09/14/2020   Type 2 diabetes mellitus with diabetic polyneuropathy, with long-term current use of insulin ([place]) 09/14/2020   Long term (current) use of anticoagulants 07/13/2020   Persistent atrial fibrillation (HCC)    Mixed hyperlipidemia    Acute on chronic diastolic CHF (congestive heart failure), NYHA class  3 ([place])    Demand ischemia ([place])    Atrial fibrillation ([place]) 04/24/2020   Other fatigue 03/24/2020   Short of breath on exertion 03/24/2020   Congestive heart failure ([place]) 03/24/2020   Vitamin D deficiency 03/24/2020   Sleep apnea 05/21/2019   History of hepatitis C 05/21/2019   Insomnia 05/21/2019   CKD (chronic kidney disease) stage 4, GFR 15-29 ml/min (HCC) 05/21/2019   GERD (gastroesophageal reflux disease) 04/20/2018   Type 2 diabetes mellitus with diabetic neuropathy, unspecified ([place]) 12/19/2016   History of TIA (transient ischemic attack) 12/19/2016   Hyperlipidemia associated with type 2 diabetes mellitus ([place]) 12/19/2016   S/P total knee replacement using cement, right 03/18/2015   Non-obstructive CAD    Cardiovascular stress test abnormal    Pleuritic chest pain 01/16/2015   Acute renal failure superimposed on stage 3 chronic kidney disease ([place]) 01/16/2015   Obesity, Class III, BMI 40-49.9 (morbid obesity) ([place]) 01/16/2015   Essential hypertension 01/16/2015   Hypothyroidism 01/16/2015   OSA on  CPAP 01/16/2015   DM type 2 (diabetes mellitus, type 2) ([place]) 01/16/2015   Coronary artery disease involving native coronary artery without angina pectoris 10/02/2014   Physical deconditioning 02/28/2013   Morbid obesity ([place]) 02/27/2013   Osteoarthritis 02/27/2013    Conditions to be addressed/monitored:Atrial Fibrillation, CHF, CAD, HTN, HLD, DMII, and CKD Stage 4  Care Plan : RN Care Manager Plan of Care  Updates made by Dimitri Ped, RN since 04/21/2022 12:00 AM     Problem: Chronic Disease Management and Care Coordination Needs (CHF,CAD,DM2 HTN,HLD, Atrial Fib, CKD4)   Priority: High     Long-Range Goal: Establish Plan of Care for Chronic Disease Management Needs (CHF,CAD,DM2 HTN,HLD, Atrial Fib, CKD4)   Start Date: 12/30/2021  Expected End Date: 12/30/2022  Priority: High  Note:   Current Barriers:  Knowledge Deficits related to plan of care for management of Atrial Fibrillation, CHF, CAD, HTN, HLD, DMII, and CKD Stage 4  Chronic Disease Management support and education needs related to Atrial Fibrillation, CHF, CAD, HTN, HLD, DMII, and CKD Stage 4  States she is trying to do more and she is doing light housework.   States she is weighting every day and her weight was 221.9 today.  Denies any swelling, chest pains or shortness of breath.  States her B/P reading have been good and her reading today was 103/64.  State her blood sugars range from 112-170 in the morning and can be in the 200's right after eating.   Denies any hypoglycemia but she did start to feel low when she was at 112.  States she is taking 24 units of Toujeo in the evenings. States she is taking 5- 7 units  at meal time depending on her sugar and she does not take if her blood sugar is lower.  States she tries to follow a low sodium diabetes diet. States she is trying to follow her fluid restriction but she does like ice cold water. States she is using her cane and denies any recent falls.    RNCM Clinical  Goal(s):  Patient will verbalize understanding of plan for management of Atrial Fibrillation, CHF, CAD, HTN, HLD, DMII, and CKD Stage 4 as evidenced by voiced adherence to plan of care verbalize basic understanding of  Atrial Fibrillation, CHF, CAD, HTN, HLD, and DMII disease process and self health management plan as evidenced by voiced understanding and teach back take all medications exactly as prescribed and will call provider for  medication related questions as evidenced by dispense report and pt verbalization  attend all scheduled medical appointments: Dr. [name] 09/19/22, Coumadin clinic 04/22/22, CCM PharmD 06/01/22,Annual Wellness Visit 02/20/23, Podiatry 05/18/22, Neurology 05/26/22,cardiology 07/08/22,Endocrinology 08/01/22, Nephrology 05/12/22 as evidenced by medical records demonstrate Improved adherence to prescribed treatment plan for Atrial Fibrillation, CHF, CAD, HTN, HLD, DMII, and CKD Stage 4 as evidenced by readings within limits, voiced adherence to plan of care continue to work with RN Care Manager to address care management and care coordination needs related to  Atrial Fibrillation, CHF, CAD, HTN, HLD, DMII, and CKD Stage 4 as evidenced by adherence to CM Team Scheduled appointments through collaboration with RN Care manager, provider, and care team.   Interventions: 1:1 collaboration with primary care provider regarding development and update of comprehensive plan of care as evidenced by provider attestation and co-signature Inter-disciplinary care team collaboration (see longitudinal plan of care) Evaluation of current treatment plan related to  self management and patient's adherence to plan as established by provider   AFIB Interventions: (Status:  Goal on track:  Yes.) Long Term Goal   Counseled on increased risk of stroke due to Afib and benefits of anticoagulation for stroke prevention Reviewed importance of adherence to anticoagulant exactly as prescribed Counseled on bleeding  risk associated with Coumadin and importance of self-monitoring for signs/symptoms of bleeding Counseled on importance of regular laboratory monitoring as prescribed Reinforced foods to limit while taking Coumadin. Reinforced home safety and fall precautions when taking Coumadin   CAD Interventions: (Status:  Goal on track:  Yes.) Long Term Goal Assessed understanding of CAD diagnosis Medications reviewed including medications utilized in CAD treatment plan Provided education on importance of blood pressure control in management of CAD Provided education on Importance of limiting foods high in cholesterol Reviewed Importance of taking all medications as prescribed Reviewed Importance of attending all scheduled provider appointments Advised to report any changes in symptoms or exercise tolerance Reinforced signs and symptoms to call doctor and when to call 911   Heart Failure Interventions:  (Status:  Goal on track:  Yes.) Long Term Goal Basic overview and discussion of pathophysiology of Heart Failure reviewed Provided education on low sodium diet Reviewed Heart Failure Action Plan in depth and provided written copy Discussed importance of daily weight and advised patient to weigh and record daily Reviewed role of diuretics in prevention of fluid overload and management of heart failure; Discussed the importance of keeping all appointments with provider  Reviewed to follow fluid restrictions.  Reviewed to pace her activity.  Reinforced s/sx to call provider and when to take extra diuretic    Chronic Kidney Disease Interventions:  (Status:  Goal on track:  Yes.) Long Term Goal Assessed the Patient understanding of chronic kidney disease    Evaluation of current treatment plan related to chronic kidney disease self management and patient's adherence to plan as established by provider      Reviewed medications with patient and discussed importance of compliance    Advised patient, providing  education and rationale, to monitor blood pressure daily and record, calling PCP for findings outside established parameters    Discussed complications of poorly controlled blood pressure such as heart disease, stroke, circulatory complications, vision complications, kidney impairment, sexual dysfunction    Discussed the impact of chronic kidney disease on daily life and mental health and acknowledged and normalized feelings of disempowerment, fear, and frustration    Last practice recorded BP readings:  BP Readings from Last 3 Encounters:  03/18/22 128/62  03/08/22 140/78  02/21/22 126/72  Most recent eGFR/CrCl:  Lab Results  Component Value Date   EGFR 25 (L) 10/29/2021    No components found for: CRCL    Diabetes Interventions:  (Status:  Goal on track:  Yes.) Long Term Goal Assessed patient's understanding of A1c goal:  <7.5% Provided education to patient about basic DM disease process Reviewed medications with patient and discussed importance of medication adherence Counseled on importance of regular laboratory monitoring as prescribed Discussed plans with patient for ongoing care management follow up and provided patient with direct contact information for care management team Provided patient with written educational materials related to hypo and hyperglycemia and importance of correct treatment Advised patient, providing education and rationale, to check cbg 3-4 times daily and record, calling provider for findings outside established parameters Reinforced to try to eat snacks with protein and lower sodium. Reinforced the rule of 15 and to call endocrinologist if she has frequent lows Lab Results  Component Value Date   HGBA1C 7.3 (A) 01/31/2022   Hyperlipidemia Interventions:  (Status:  Condition stable.  Not addressed this visit.) Long Term Goal Medication review performed; medication list updated in electronic medical record.  Provider established cholesterol goals  reviewed Counseled on importance of regular laboratory monitoring as prescribed Reviewed role and benefits of statin for ASCVD risk reduction Reviewed importance of limiting foods high in cholesterol  Hypertension Interventions:  (Status:  Goal on track:  Yes.) Long Term Goal Last practice recorded BP readings:  BP Readings from Last 3 Encounters:  03/18/22 128/62  03/08/22 140/78  02/21/22 126/72  Most recent eGFR/CrCl:  Lab Results  Component Value Date   EGFR 25 (L) 10/29/2021    No components found for: CRCL  Evaluation of current treatment plan related to hypertension self management and patient's adherence to plan as established by provider Provided education to patient re: stroke prevention, s/s of heart attack and stroke Reviewed medications with patient and discussed importance of compliance Counseled on adverse effects of illicit drug and excessive alcohol use in patients with high blood pressure  Discussed plans with patient for ongoing care management follow up and provided patient with direct contact information for care management team Advised patient, providing education and rationale, to monitor blood pressure daily and record, calling PCP for findings outside established parameters Provided education on prescribed diet low sodium. Low CHO heart healthy Reinforced to try to increase activity as tolerated  Patient Goals/Self-Care Activities: Take all medications as prescribed Attend all scheduled provider appointments Call pharmacy for medication refills 3-7 days in advance of running out of medications Call provider office for new concerns or questions  call office if I gain more than 2 pounds in one day or 5 pounds in one week keep legs up while sitting track weight in diary weigh myself daily follow rescue plan if symptoms flare-up eat more whole grains, fruits and vegetables, lean meats and healthy fats keep appointment with eye doctor check blood sugar at  prescribed times: three times daily and when you have symptoms of low or high blood sugar fill half of plate with vegetables manage portion size Try eating protein with snacks check pulse (heart) rate once a day make a plan to eat healthy keep all lab appointments take medicine as prescribed check blood pressure daily choose a place to take my blood pressure (home, clinic or office, retail store) keep a blood pressure log take blood pressure log to all doctor appointments call for  medicine refill 2 or 3 days before it runs out take all medications exactly as prescribed call doctor with any symptoms you believe are related to your medicine  Follow Up Plan:  Telephone follow up appointment with care management team member scheduled for:  05/24/22 The patient has been provided with contact information for the care management team and has been advised to call with any health related questions or concerns.       Plan:Telephone follow up appointment with care management team member scheduled for:  05/24/22 The patient has been provided with contact information for the care management team and has been advised to call with any health related questions or concerns.  Peter Garter RN, Jackquline [place], CDE Care Management Coordinator [facility] [phone]

## 2022-04-22 ENCOUNTER — Ambulatory Visit (INDEPENDENT_AMBULATORY_CARE_PROVIDER_SITE_OTHER): Payer: Medicare Other | Admitting: *Deleted

## 2022-04-22 DIAGNOSIS — I4819 Other persistent atrial fibrillation: Secondary | ICD-10-CM

## 2022-04-22 DIAGNOSIS — Z5181 Encounter for therapeutic drug level monitoring: Secondary | ICD-10-CM

## 2022-04-22 DIAGNOSIS — Z7901 Long term (current) use of anticoagulants: Secondary | ICD-10-CM

## 2022-04-22 LAB — POCT INR: INR: 1.9 — AB (ref 2.0–3.0)

## 2022-04-22 NOTE — Patient Instructions (Signed)
Description   Take 1 tablet of warfarin today, then continue taking warfarin 1/2 tablet daily. Repeat INR in 3 weeks; Anticoagulation Clinic [phone]

## 2022-04-28 IMAGING — CT CT CHEST W/O CM
2 of 4 series · 15 of 36 positions shown, 18 images · non-contrast
Comparison: 02/17/2020

CLINICAL DATA: Follow-up pulmonary nodules

EXAM:
CT CHEST WITHOUT CONTRAST
TECHNIQUE: Multidetector CT imaging of the chest was performed following the
standard protocol without IV contrast.

[Series 2: thorax · axial · 0.68mm/px · z∈[-275,-41]mm · 12 of 139 slices shown, 15 images]
[im 11/139  mediastinal]
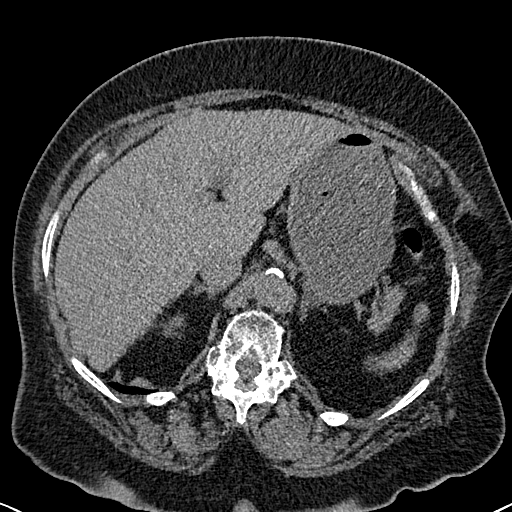
[im 11/139  lung]
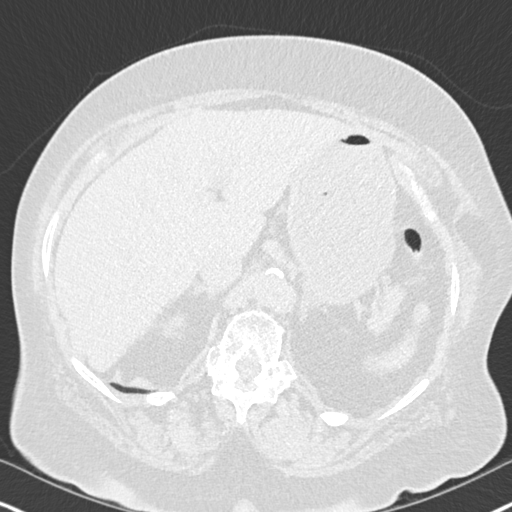
[im 22/139  lung]
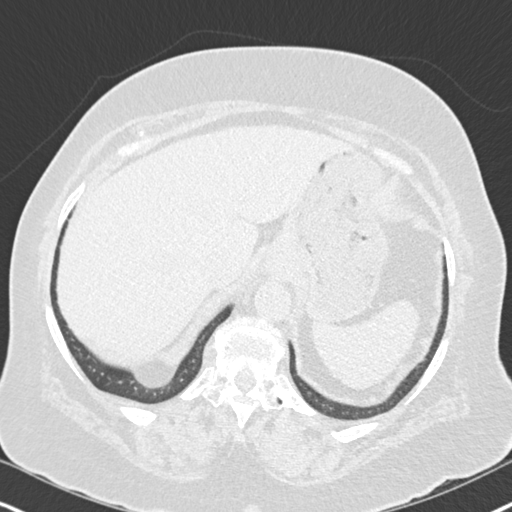
[im 32/139  lung]
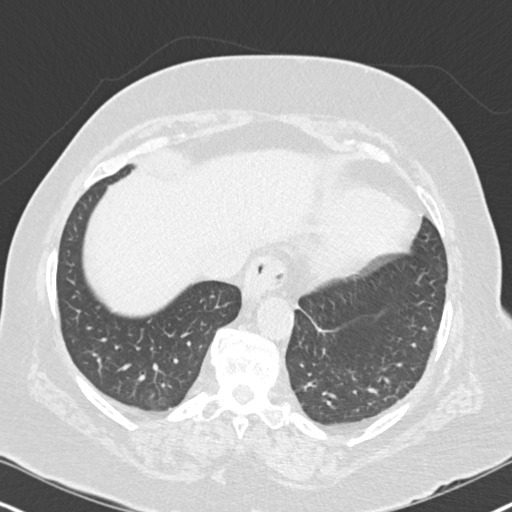
[im 43/139  lung]
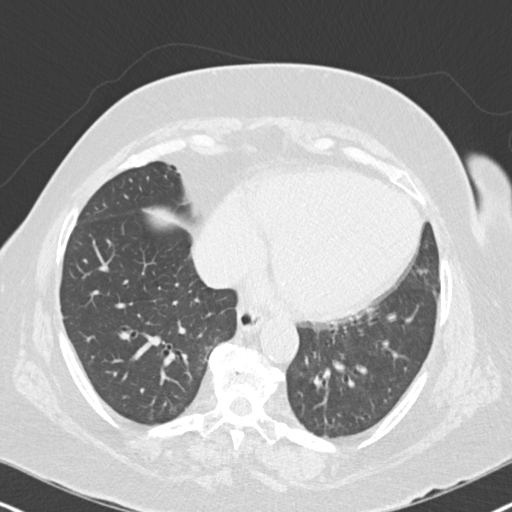
[im 54/139  mediastinal]
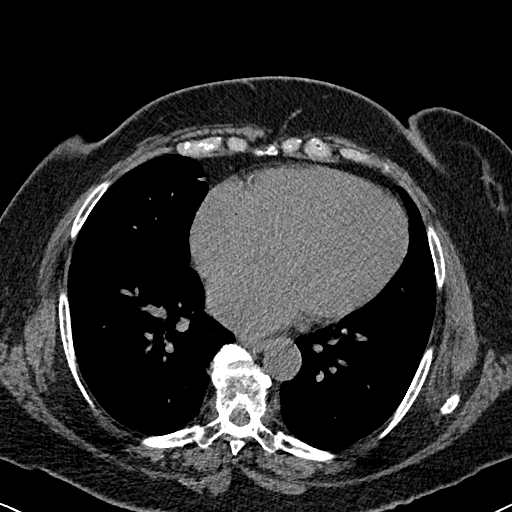
[im 54/139  lung]
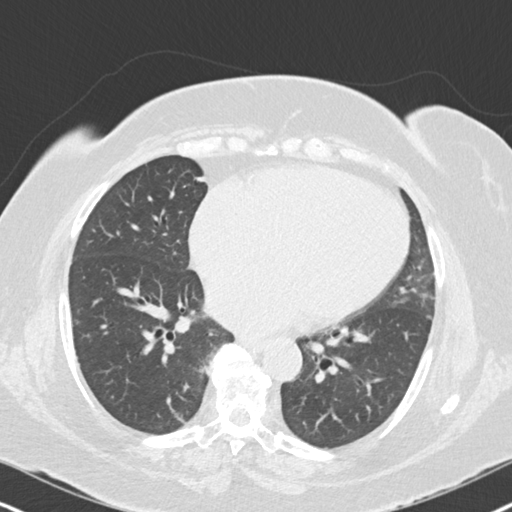
[im 64/139  lung]
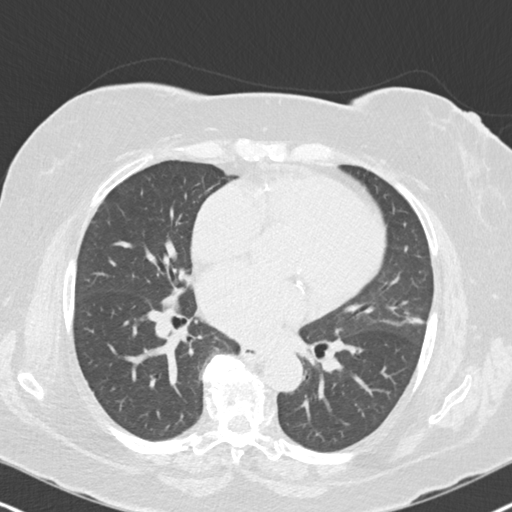
[im 75/139  lung]
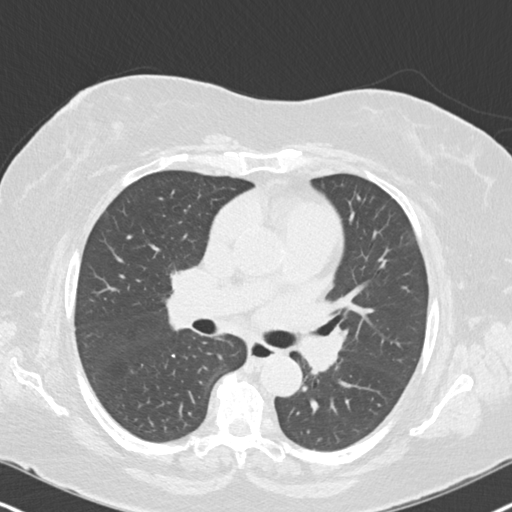
[im 85/139  lung]
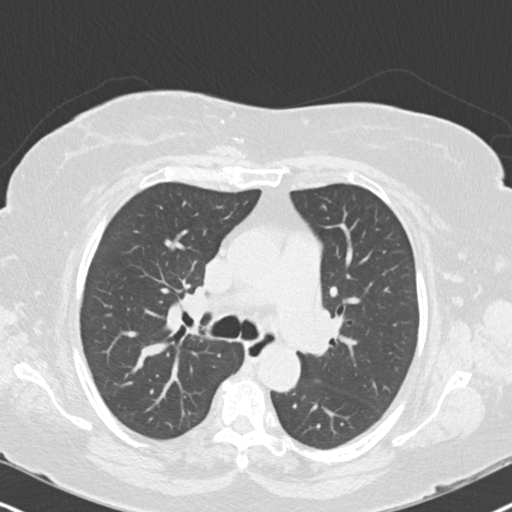
[im 96/139  mediastinal]
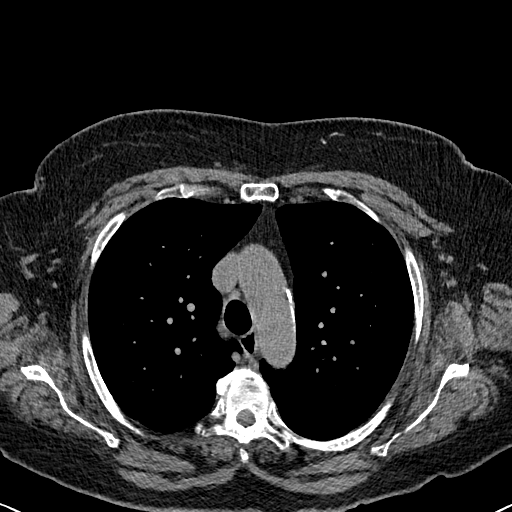
[im 96/139  lung]
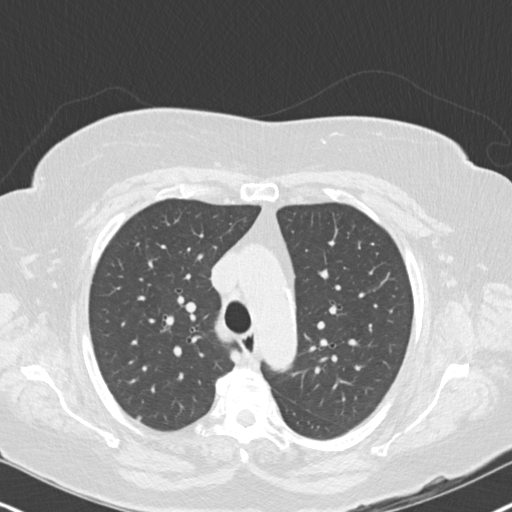
[im 107/139  lung]
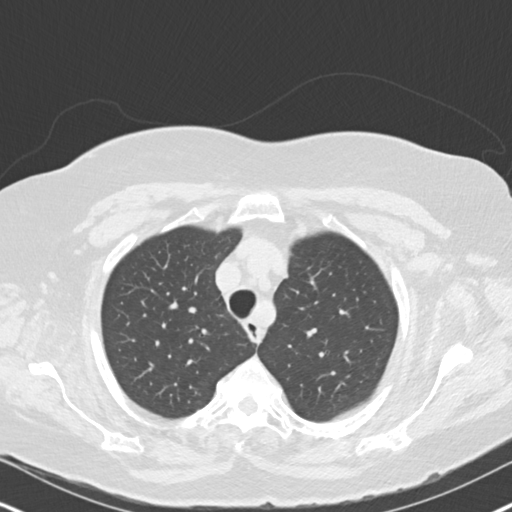
[im 117/139  lung]
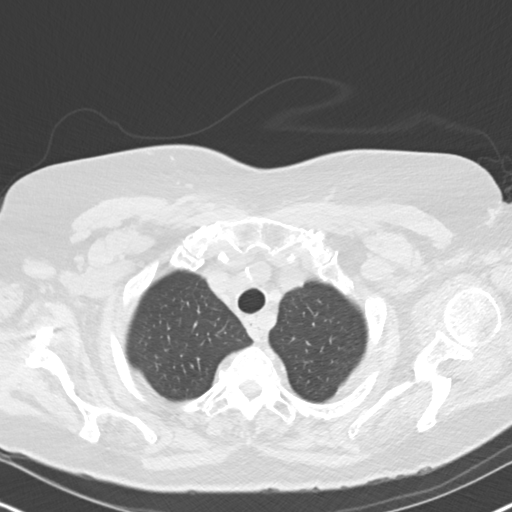
[im 128/139  lung]
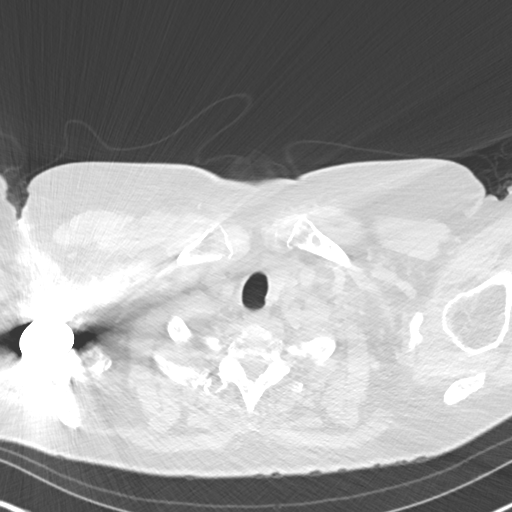

[Series 5: coronal · coronal · 0.59mm/px · 3 of 124 slices shown]
[im 25/124  lung]
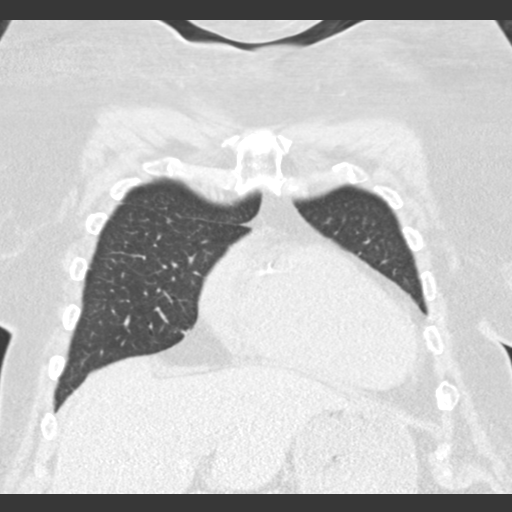
[im 50/124  lung]
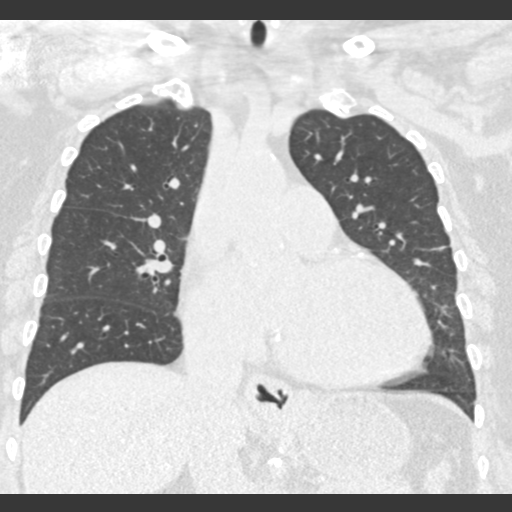
[im 74/124  lung]
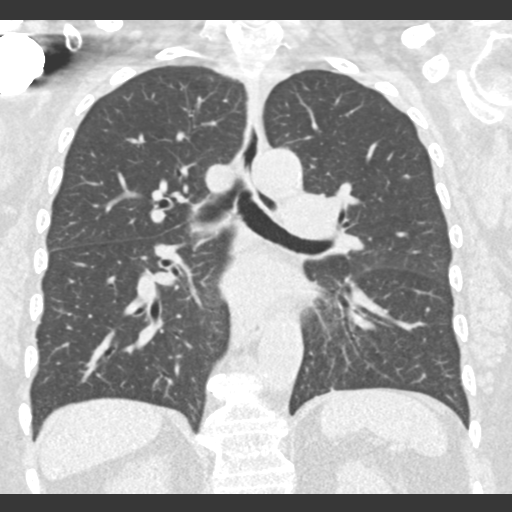

[15 of 36 positions shown; findings below may reference images not displayed]

FINDINGS: Cardiovascular: Aortic atherosclerosis. Mild cardiomegaly.
Three-vessel coronary artery calcifications. No pericardial
effusion.

Mediastinum/Nodes: No enlarged mediastinal, hilar, or axillary lymph
nodes. Small hiatal hernia. Thyroid gland, trachea, and esophagus
demonstrate no significant findings.

Lungs/Pleura: Previously seen 6 mm fissural nodule of the right
middle lobe is almost completely resolved (series 3, image 62).
Occasional tiny noncalcified pulmonary nodules measuring no greater
than 2 mm are stable and definitively benign, for example in the
peripheral left upper lobe (series 3, image 57). Occasional
scattered small, definitively benign calcified pulmonary nodules. No
pleural effusion or pneumothorax.

Upper Abdomen: No acute abnormality.

Musculoskeletal: No chest wall mass or suspicious bone lesions
identified.
IMPRESSION: 1. Previously seen 6 mm fissural nodule of the right middle lobe is
almost completely resolved consistent with resolution of infection
or inflammation. Occasional tiny noncalcified pulmonary nodules
measuring no greater than 2 mm are stable and definitively benign,
for example in the peripheral left upper lobe. No further follow-up
or characterization is required for these benign nodules.
2. Cardiomegaly and coronary artery disease.

Aortic Atherosclerosis ([id number]).

## 2022-05-11 ENCOUNTER — Other Ambulatory Visit: Payer: Self-pay | Admitting: Family Medicine

## 2022-05-11 ENCOUNTER — Other Ambulatory Visit: Payer: Self-pay | Admitting: Cardiovascular Disease

## 2022-05-11 ENCOUNTER — Telehealth: Payer: Self-pay | Admitting: Pharmacist

## 2022-05-11 NOTE — Telephone Encounter (Signed)
Prescription refill request received for warfarin Lov: O neal, 03/08/2022 Next INR check: 05/13/2022 Warfarin tablet strength: '5mg'$ 

## 2022-05-11 NOTE — Chronic Care Management (AMB) (Signed)
Chronic Care Management Pharmacy Assistant   Name: Regina [name]  MRN: [id number] DOB: Aug 16, 1941  Reason for Encounter: Medication Review / Medication Coordination Call   Recent office visits:  None  Recent consult visits:  None  Hospital visits:  None  Medications: Outpatient Encounter Medications as of 05/11/2022  Medication Sig   acetaminophen (TYLENOL) 500 MG tablet Take 1,000 mg by mouth every 6 (six) hours as needed for mild pain.   Alcohol Swabs (B-D SINGLE USE SWABS REGULAR) PADS Use to test blood sugar up to 3 times daily   allopurinol (ZYLOPRIM) 100 MG tablet TAKE ONE TABLET BY MOUTH EVERY MORNING   aspirin EC 81 MG EC tablet Take 1 tablet (81 mg total) by mouth daily. Swallow whole.   atorvastatin (LIPITOR) 20 MG tablet TAKE ONE TABLET BY MOUTH EVERYDAY AT BEDTIME   Blood Glucose Monitoring Suppl (ACCU-CHEK GUIDE) w/Device KIT 1 Device by Does not apply route 3 (three) times daily.   Capsaicin 0.033 % CREA Apply 1 application topically 3 (three) times daily. (Patient taking differently: Apply 1 application. topically 3 (three) times daily. As needed)   Cholecalciferol (VITAMIN D3) 50 MCG (2000 UT) capsule Take 2,000 Units by mouth daily.   Continuous Blood Gluc Sensor (DEXCOM G6 SENSOR) MISC 1 Device by Does not apply route as directed.   Continuous Blood Gluc Transmit (DEXCOM G6 TRANSMITTER) MISC 1 Device by Does not apply route as directed.   famotidine (PEPCID) 20 MG tablet Take 1 tablet (20 mg total) by mouth at bedtime.   FARXIGA 10 MG TABS tablet TAKE ONE TABLET BY MOUTH ONCE DAILY   Fluocinolone Acetonide 0.01 % OIL instill one drop in ears every 2-3 DAYS AS NEEDED FOR pruritis   gabapentin (NEURONTIN) 600 MG tablet TAKE ONE TABLET BY MOUTH EVERY MORNING and TAKE ONE TABLET BY MOUTH EVERYDAY AT BEDTIME   glucose blood (ACCU-CHEK GUIDE) test strip Use to test blood sugar up to 3 times daily   insulin glargine, 1 Unit Dial, (TOUJEO SOLOSTAR) 300 UNIT/ML  Solostar Pen Inject 24 Units into the skin daily. Eat a snack with protein nightly before bedtime.   insulin lispro (HUMALOG KWIKPEN) 100 UNIT/ML KwikPen Max daily 30 units   Insulin Pen Needle 32G X 4 MM MISC 1 Device by Does not apply route in the morning, at noon, in the evening, and at bedtime.   ipratropium (ATROVENT) 0.06 % nasal spray Place 2 sprays into both nostrils 4 (four) times daily. (Patient taking differently: Place 2 sprays into both nostrils 4 (four) times daily. As needed)   ketoconazole (NIZORAL) 2 % shampoo Apply 1 application. topically 2 (two) times a week.   Lancets Misc. (ACCU-CHEK FASTCLIX LANCET) KIT Use to test blood sugar up to 3 times daily   levothyroxine (SYNTHROID) 150 MCG tablet TAKE ONE TABLET BY MOUTH BEFORE BREAKFAST   magnesium oxide (MAG-OX) 400 MG tablet TAKE ONE TABLET BY MOUTH EVERY MORNING   meclizine (ANTIVERT) 25 MG tablet Take 25 mg by mouth daily as needed for dizziness.   metoprolol succinate (TOPROL-XL) 25 MG 24 hr tablet Take 1 tablet (25 mg total) by mouth daily. Take with or immediately following a meal.   nitroGLYCERIN (NITROSTAT) 0.4 MG SL tablet Dissolve 1 tab under tongue as needed for chest pain. May repeat every 5 minutes x 2 doses. If no relief call 9-1-1.   pantoprazole (PROTONIX) 20 MG tablet Take 1 tablet (20 mg total) by mouth daily.   Polyethylene Glycol 3350 (  MIRALAX PO) Take 1 Package by mouth daily as needed (constipation).   potassium chloride SA (KLOR-CON M20) 20 MEQ tablet Take 1 tablet (20 mEq total) by mouth daily.   Propylene Glycol (SYSTANE BALANCE OP) Place 1 drop into both eyes daily as needed (for dry eyes).   SALINE MIST SPRAY NA Place 2 sprays into the nose at bedtime.   Tafamidis 61 MG CAPS Take 61 mg by mouth daily.   torsemide (DEMADEX) 20 MG tablet Take 2 tablets (40 mg total) by mouth daily.   trolamine salicylate (ASPERCREME) 10 % cream Apply 1 application. topically as needed for muscle pain.   warfarin (COUMADIN)  5 MG tablet TAKE 1/2 TO 1 TABLET BY MOUTH ONCE daily OR AS DIRECTED by THE coumadin clinic   No facility-administered encounter medications on file as of 05/11/2022.  Reviewed chart for medication changes ahead of medication coordination call.  No OVs, Consults, or hospital visits since last care coordination call/Pharmacist visit. (If appropriate, list visit date, provider name)  No medication changes indicated OR if recent visit, treatment plan here.  BP Readings from Last 3 Encounters:  03/18/22 128/62  03/08/22 140/78  02/21/22 126/72    Lab Results  Component Value Date   HGBA1C 7.3 (A) 01/31/2022     Patient obtains medications through Adherence Packaging  30 Days    Last adherence delivery included: Warfarin 5 mg tablet (easy top vials) use as directed Klor-Con 20 meq - 1 tablet at breakfast Protonix 20 mg - 1 tablet at breakfast Aspirin 81 mg - 1 tablet at breakfast Torsemide 20 mg 2 tablets at breakfast  Cetirizine 10 mg 1 tablet at breakfast (OTC med) Atorvastatin 20 mg 1 tablet at bedtime Farxiga 10 mg 1 tablet at breakfast Metoprolol succinate 25 mg tablets 1 tablet at breakfast Gabapentin 600 mg tablet  1 tablet at breakfast and 1 tablet at bedtime Levothyroxine 150 mcg 1 tablet before breakfast Magnesium oxide 400 mg 1 tablet at breakfast Allopurinol 100 mg 1 tablet at breakfast Humalog Kwikpen 100 un/ml - max daily 30 units Toujeo Solostar 300 un/ml - inject 24 units daily Pen needles 32 g X 4 mm Dexcom sensors   Patient declined (meds) last month: Nitrostat 0.69m - 1 tablet every 5 min as needed Fluocinolone Acetonide 0.01 % OIL Ketoconazole (NIZORAL) 2 % shampoo Accucheck guide test strips (pt is no longer using)     Patient is due for next adherence delivery on: 05/24/2022   Called patient and reviewed medications and coordinated delivery.   This delivery to include: Warfarin 5 mg tablet (easy top vials) use as directed Klor-Con 20 meq - 1 tablet  at breakfast Protonix 20 mg - 1 tablet at breakfast Aspirin 81 mg - 1 tablet at breakfast Torsemide 20 mg 2 tablets at breakfast  Cetirizine 10 mg 1 tablet at breakfast (OTC med) Atorvastatin 20 mg 1 tablet at bedtime Farxiga 10 mg 1 tablet at breakfast Metoprolol succinate 25 mg tablets 1 tablet at breakfast Gabapentin 600 mg tablet  1 tablet at breakfast and 1 tablet at bedtime Levothyroxine 150 mcg 1 tablet before breakfast Magnesium oxide 400 mg 1 tablet at breakfast Allopurinol 100 mg 1 tablet at breakfast Humalog Kwikpen 100 un/ml - max daily 30 units Pen needles 32 g X 4 mm  Does patient need these medications with this order? Nitrostat 0.471m- 1 tablet every 5 min as needed   Fluocinolone Acetonide 0.01 % OIL Ketoconazole (NIZORAL) 2 % shampoo  Patient will  need a short fill: No short fills needed   Coordinated acute fill: No acute fills needed   Declined the following medications:  Dexcom not due til 07/10/2022 Toujeo not due til 06/14/2022   Unable to reach patient or her daughter to confirmed delivery date of 05/24/2022.   Care Gaps: AWV - scheduled 02/20/2023 Last BP - 128/62 on 03/18/2022 Last A1C - 7.3 on 01/31/2022 Covid booster - overdue Singrix - overdue  Star Rating Drugs: Atorvastatin 5m - last filled 04/18/2022 30 DS at [facility] Farxiga 140m- last filled 04/18/2022 30 [phone]

## 2022-05-13 ENCOUNTER — Ambulatory Visit (INDEPENDENT_AMBULATORY_CARE_PROVIDER_SITE_OTHER): Payer: Medicare Other

## 2022-05-13 ENCOUNTER — Telehealth: Payer: Self-pay | Admitting: Pharmacist

## 2022-05-13 DIAGNOSIS — Z7901 Long term (current) use of anticoagulants: Secondary | ICD-10-CM | POA: Diagnosis not present

## 2022-05-13 DIAGNOSIS — I4891 Unspecified atrial fibrillation: Secondary | ICD-10-CM

## 2022-05-13 DIAGNOSIS — I4819 Other persistent atrial fibrillation: Secondary | ICD-10-CM | POA: Diagnosis not present

## 2022-05-13 LAB — POCT INR: INR: 2.5 (ref 2.0–3.0)

## 2022-05-13 NOTE — Telephone Encounter (Signed)
Regina [name] with patient and granddaughter today regarding status of Amvuttra. Patient thinks that forms may have been sent to her but she did not understand them so they may have been thrown out.  Granddaughter is going to check with mother who has power of attorney to see if they still have forms. Offered to complete the forms for her if she can find them.  Gave email address for them to send over. If she can not find them, gave granddaughter a new Amvuttra start form to have grandmother to sign and email back directly to me.

## 2022-05-13 NOTE — Patient Instructions (Signed)
Description   Continue taking warfarin 1/2 tablet daily. Repeat INR in 4 weeks; Anticoagulation Clinic [phone]

## 2022-05-16 DIAGNOSIS — I4891 Unspecified atrial fibrillation: Secondary | ICD-10-CM

## 2022-05-16 DIAGNOSIS — I43 Cardiomyopathy in diseases classified elsewhere: Secondary | ICD-10-CM | POA: Diagnosis not present

## 2022-05-16 DIAGNOSIS — I509 Heart failure, unspecified: Secondary | ICD-10-CM | POA: Diagnosis not present

## 2022-05-16 DIAGNOSIS — E785 Hyperlipidemia, unspecified: Secondary | ICD-10-CM | POA: Diagnosis not present

## 2022-05-16 DIAGNOSIS — I2510 Atherosclerotic heart disease of native coronary artery without angina pectoris: Secondary | ICD-10-CM | POA: Diagnosis not present

## 2022-05-16 DIAGNOSIS — I10 Essential (primary) hypertension: Secondary | ICD-10-CM

## 2022-05-16 DIAGNOSIS — I129 Hypertensive chronic kidney disease with stage 1 through stage 4 chronic kidney disease, or unspecified chronic kidney disease: Secondary | ICD-10-CM | POA: Diagnosis not present

## 2022-05-16 DIAGNOSIS — E1122 Type 2 diabetes mellitus with diabetic chronic kidney disease: Secondary | ICD-10-CM | POA: Diagnosis not present

## 2022-05-16 DIAGNOSIS — E854 Organ-limited amyloidosis: Secondary | ICD-10-CM | POA: Diagnosis not present

## 2022-05-16 DIAGNOSIS — E113499 Type 2 diabetes mellitus with severe nonproliferative diabetic retinopathy without macular edema, unspecified eye: Secondary | ICD-10-CM | POA: Diagnosis not present

## 2022-05-16 DIAGNOSIS — N184 Chronic kidney disease, stage 4 (severe): Secondary | ICD-10-CM | POA: Diagnosis not present

## 2022-05-16 DIAGNOSIS — N1832 Chronic kidney disease, stage 3b: Secondary | ICD-10-CM | POA: Diagnosis not present

## 2022-05-17 LAB — COMPREHENSIVE METABOLIC PANEL
Albumin: 3.9 (ref 3.5–5.0)
Calcium: 9.2 (ref 8.7–10.7)

## 2022-05-17 LAB — BASIC METABOLIC PANEL
BUN: 46 — AB (ref 4–21)
CO2: 31 — AB (ref 13–22)
Chloride: 100 (ref 99–108)
Creatinine: 2.2 — AB (ref 0.5–1.1)
Glucose: 130
Potassium: 4.1 mEq/L (ref 3.5–5.1)
Sodium: 138 (ref 137–147)

## 2022-05-17 LAB — CBC AND DIFFERENTIAL: Hemoglobin: 12 (ref 12.0–16.0)

## 2022-05-18 ENCOUNTER — Ambulatory Visit: Payer: Medicare Other | Admitting: Podiatry

## 2022-05-18 DIAGNOSIS — E119 Type 2 diabetes mellitus without complications: Secondary | ICD-10-CM | POA: Diagnosis not present

## 2022-05-18 DIAGNOSIS — H43392 Other vitreous opacities, left eye: Secondary | ICD-10-CM | POA: Diagnosis not present

## 2022-05-18 DIAGNOSIS — Z961 Presence of intraocular lens: Secondary | ICD-10-CM | POA: Diagnosis not present

## 2022-05-18 DIAGNOSIS — M353 Polymyalgia rheumatica: Secondary | ICD-10-CM | POA: Diagnosis not present

## 2022-05-18 DIAGNOSIS — H04123 Dry eye syndrome of bilateral lacrimal glands: Secondary | ICD-10-CM | POA: Diagnosis not present

## 2022-05-18 LAB — HM DIABETES EYE EXAM

## 2022-05-23 ENCOUNTER — Encounter: Payer: Self-pay | Admitting: Family Medicine

## 2022-05-24 ENCOUNTER — Ambulatory Visit (INDEPENDENT_AMBULATORY_CARE_PROVIDER_SITE_OTHER): Payer: Medicare Other

## 2022-05-24 DIAGNOSIS — E113499 Type 2 diabetes mellitus with severe nonproliferative diabetic retinopathy without macular edema, unspecified eye: Secondary | ICD-10-CM

## 2022-05-24 DIAGNOSIS — I509 Heart failure, unspecified: Secondary | ICD-10-CM

## 2022-05-24 DIAGNOSIS — I2510 Atherosclerotic heart disease of native coronary artery without angina pectoris: Secondary | ICD-10-CM

## 2022-05-24 NOTE — Patient Instructions (Signed)
Visit Information  Thank you for taking time to visit with me today. Please don't hesitate to contact me if I can be of assistance to you before our next scheduled telephone appointment.  Following are the goals we discussed today:  Take all medications as prescribed Attend all scheduled provider appointments Call pharmacy for medication refills 3-7 days in advance of running out of medications Call provider office for new concerns or questions  call office if I gain more than 2 pounds in one day or 5 pounds in one week keep legs up while sitting track weight in diary weigh myself daily follow rescue plan if symptoms flare-up eat more whole grains, fruits and vegetables, lean meats and healthy fats keep appointment with eye doctor check blood sugar at prescribed times: three times daily and when you have symptoms of low or high blood sugar fill half of plate with vegetables manage portion size Try eating protein with snacks check pulse (heart) rate once a day make a plan to eat healthy keep all lab appointments take medicine as prescribed check blood pressure daily choose a place to take my blood pressure (home, clinic or office, retail store) keep a blood pressure log take blood pressure log to all doctor appointments call for medicine refill 2 or 3 days before it runs out take all medications exactly as prescribed call doctor with any symptoms you believe are related to your medicine  Our next appointment is by telephone on 06/28/22 at 10 AM  Please call the care guide team at [phone] if you need to cancel or reschedule your appointment.   If you are experiencing a Mental Health or [place] or need someone to talk to, please call the Suicide and Crisis Lifeline: 988 call the [place] National Suicide Prevention Lifeline: (706)684-4430 or TTY: 223-211-7996 TTY 323-379-0228) to talk to a trained counselor call 1-800-273-TALK (toll free, 24 hour hotline) go to  [facility] [address] [place] [phone]) call 911   Patient verbalizes understanding of instructions and care plan provided today and agrees to view in [place]. Active MyChart status and patient understanding of how to access instructions and care plan via MyChart confirmed with patient.    Peter Garter RN, Jackquline Denmark, CDE Care Management Coordinator [facility] [phone]

## 2022-05-24 NOTE — Chronic Care Management (AMB) (Signed)
Chronic Care Management   CCM RN Visit Note  05/24/2022 Name: Regina [name] MRN: [id number] DOB: [birth year]  Subjective: Regina [name] is a 81 y.o. year old female who is a primary care patient of [name], Malka So, MD. The care management team was consulted for assistance with disease management and care coordination needs.    Engaged with patient by telephone for follow up visit in response to provider referral for case management and/or care coordination services.   Consent to Services:  The patient was given information about Chronic Care Management services, agreed to services, and gave verbal consent prior to initiation of services.  Please see initial visit note for detailed documentation.   Patient agreed to services and verbal consent obtained.   Assessment: Review of patient past medical history, allergies, medications, health status, including review of consultants reports, laboratory and other test data, was performed as part of comprehensive evaluation and provision of chronic care management services.   SDOH (Social Determinants of Health) assessments and interventions performed:    CCM Care Plan  Allergies  Allergen Reactions   Ace Inhibitors Other (See Comments)    unknown   Amlodipine Other (See Comments)    unknown   Atenolol Other (See Comments)    bradycardia   Avandia [Rosiglitazone] Other (See Comments)    unknown   Darvon [Propoxyphene] Other (See Comments)    unknown   Erythromycin Itching   Hydralazine Other (See Comments)    Burning in throat and chest   Hydrocodone Other (See Comments)    Hallucinations.   Levofloxacin Itching   Morphine And Related Other (See Comments)    Dizzy and hallucianation, vomiting; Willing to try low dose   Percocet [Oxycodone-Acetaminophen] Other (See Comments)    hallucination   Spironolactone Other (See Comments)    unknown   Tramadol Other (See Comments)    Unknown/does not recall reaction but does not  want to take again    Outpatient Encounter Medications as of 05/24/2022  Medication Sig   acetaminophen (TYLENOL) 500 MG tablet Take 1,000 mg by mouth every 6 (six) hours as needed for mild pain.   Alcohol Swabs (B-D SINGLE USE SWABS REGULAR) PADS Use to test blood sugar up to 3 times daily   allopurinol (ZYLOPRIM) 100 MG tablet TAKE ONE TABLET BY MOUTH EVERY MORNING   aspirin EC 81 MG EC tablet Take 1 tablet (81 mg total) by mouth daily. Swallow whole.   atorvastatin (LIPITOR) 20 MG tablet TAKE ONE TABLET BY MOUTH EVERYDAY AT BEDTIME   Blood Glucose Monitoring Suppl (ACCU-CHEK GUIDE) w/Device KIT 1 Device by Does not apply route 3 (three) times daily.   Capsaicin 0.033 % CREA Apply 1 application topically 3 (three) times daily. (Patient taking differently: Apply 1 application. topically 3 (three) times daily. As needed)   Cholecalciferol (VITAMIN D3) 50 MCG (2000 UT) capsule Take 2,000 Units by mouth daily.   Continuous Blood Gluc Sensor (DEXCOM G6 SENSOR) MISC 1 Device by Does not apply route as directed.   Continuous Blood Gluc Transmit (DEXCOM G6 TRANSMITTER) MISC 1 Device by Does not apply route as directed.   famotidine (PEPCID) 20 MG tablet Take 1 tablet (20 mg total) by mouth at bedtime.   FARXIGA 10 MG TABS tablet TAKE ONE TABLET BY MOUTH ONCE DAILY   Fluocinolone Acetonide 0.01 % OIL instill one drop in ears every 2-3 DAYS AS NEEDED FOR pruritis   gabapentin (NEURONTIN) 600 MG tablet TAKE ONE TABLET BY MOUTH EVERY  MORNING and TAKE ONE TABLET BY MOUTH EVERYDAY AT BEDTIME   glucose blood (ACCU-CHEK GUIDE) test strip Use to test blood sugar up to 3 times daily   insulin glargine, 1 Unit Dial, (TOUJEO SOLOSTAR) 300 UNIT/ML Solostar Pen Inject 24 Units into the skin daily. Eat a snack with protein nightly before bedtime.   insulin lispro (HUMALOG KWIKPEN) 100 UNIT/ML KwikPen Max daily 30 units   Insulin Pen Needle 32G X 4 MM MISC 1 Device by Does not apply route in the morning, at noon, in  the evening, and at bedtime.   ipratropium (ATROVENT) 0.06 % nasal spray Place 2 sprays into both nostrils 4 (four) times daily. (Patient taking differently: Place 2 sprays into both nostrils 4 (four) times daily. As needed)   ketoconazole (NIZORAL) 2 % shampoo Apply 1 application. topically 2 (two) times a week.   Lancets Misc. (ACCU-CHEK FASTCLIX LANCET) KIT Use to test blood sugar up to 3 times daily   levothyroxine (SYNTHROID) 150 MCG tablet TAKE ONE TABLET BY MOUTH BEFORE BREAKFAST   magnesium oxide (MAG-OX) 400 MG tablet TAKE ONE TABLET BY MOUTH EVERY MORNING   meclizine (ANTIVERT) 25 MG tablet Take 25 mg by mouth daily as needed for dizziness.   metoprolol succinate (TOPROL-XL) 25 MG 24 hr tablet Take 1 tablet (25 mg total) by mouth daily. Take with or immediately following a meal.   nitroGLYCERIN (NITROSTAT) 0.4 MG SL tablet Dissolve 1 tab under tongue as needed for chest pain. May repeat every 5 minutes x 2 doses. If no relief call 9-1-1.   pantoprazole (PROTONIX) 20 MG tablet Take 1 tablet (20 mg total) by mouth daily.   Polyethylene Glycol 3350 (MIRALAX PO) Take 1 Package by mouth daily as needed (constipation).   potassium chloride SA (KLOR-CON M20) 20 MEQ tablet Take 1 tablet (20 mEq total) by mouth daily.   Propylene Glycol (SYSTANE BALANCE OP) Place 1 drop into both eyes daily as needed (for dry eyes).   SALINE MIST SPRAY NA Place 2 sprays into the nose at bedtime.   Tafamidis 61 MG CAPS Take 61 mg by mouth daily.   torsemide (DEMADEX) 20 MG tablet Take 2 tablets (40 mg total) by mouth daily.   trolamine salicylate (ASPERCREME) 10 % cream Apply 1 application. topically as needed for muscle pain.   warfarin (COUMADIN) 5 MG tablet TAKE 1/2 TO 1 TABLET BY MOUTH ONCE daily OR AS DIRECTED by THE coumadin clinic   No facility-administered encounter medications on file as of 05/24/2022.    Patient Active Problem List   Diagnosis Date Noted   Pain due to onychomycosis of toenail of left  foot 02/11/2022   Chest pain 12/26/2021   Left atrial thrombus 12/25/2021   Wild-type transthyretin-related (ATTR) amyloidosis (HCC)    Lung nodules 12/13/2021   Polymyalgia rheumatica ([place]) 06/30/2021   Neck pain 06/18/2021   Gait abnormality 06/18/2021   Memory loss 06/18/2021   Atherosclerosis of aorta ([place]) 06/09/2021   Type 2 diabetes mellitus with stage 3a chronic kidney disease, with long-term current use of insulin ([place]) 09/14/2020   Type 2 diabetes mellitus with retinopathy, with long-term current use of insulin ([place]) 09/14/2020   Diabetes mellitus ([place]) 09/14/2020   Type 2 diabetes mellitus with diabetic polyneuropathy, with long-term current use of insulin ([place]) 09/14/2020   Long term (current) use of anticoagulants 07/13/2020   Persistent atrial fibrillation (HCC)    Mixed hyperlipidemia    Acute on chronic diastolic CHF (congestive heart failure), NYHA class  3 ([place])    Demand ischemia ([place])    Atrial fibrillation ([place]) 04/24/2020   Other fatigue 03/24/2020   Short of breath on exertion 03/24/2020   Congestive heart failure ([place]) 03/24/2020   Vitamin D deficiency 03/24/2020   Sleep apnea 05/21/2019   History of hepatitis C 05/21/2019   Insomnia 05/21/2019   CKD (chronic kidney disease) stage 4, GFR 15-29 ml/min (HCC) 05/21/2019   GERD (gastroesophageal reflux disease) 04/20/2018   Type 2 diabetes mellitus with diabetic neuropathy, unspecified ([place]) 12/19/2016   History of TIA (transient ischemic attack) 12/19/2016   Hyperlipidemia associated with type 2 diabetes mellitus ([place]) 12/19/2016   S/P total knee replacement using cement, right 03/18/2015   Non-obstructive CAD    Cardiovascular stress test abnormal    Pleuritic chest pain 01/16/2015   Acute renal failure superimposed on stage 3 chronic kidney disease ([place]) 01/16/2015   Obesity, Class III, BMI 40-49.9 (morbid obesity) ([place]) 01/16/2015   Essential hypertension 01/16/2015   Hypothyroidism 01/16/2015   OSA on  CPAP 01/16/2015   DM type 2 (diabetes mellitus, type 2) ([place]) 01/16/2015   Coronary artery disease involving native coronary artery without angina pectoris 10/02/2014   Physical deconditioning 02/28/2013   Morbid obesity ([place]) 02/27/2013   Osteoarthritis 02/27/2013    Conditions to be addressed/monitored:Atrial Fibrillation, CHF, CAD, HTN, and DMII  Care Plan : RN Care Manager Plan of Care  Updates made by Dimitri Ped, RN since 05/24/2022 12:00 AM     Problem: Chronic Disease Management and Care Coordination Needs (CHF,CAD,DM2 HTN,HLD, Atrial Fib, CKD4)   Priority: High     Long-Range Goal: Establish Plan of Care for Chronic Disease Management Needs (CHF,CAD,DM2 HTN,HLD, Atrial Fib, CKD4)   Start Date: 12/30/2021  Expected End Date: 12/30/2022  Priority: High  Note:   Current Barriers:  Knowledge Deficits related to plan of care for management of Atrial Fibrillation, CHF, CAD, HTN, HLD, DMII, and CKD Stage 4  Chronic Disease Management support and education needs related to Atrial Fibrillation, CHF, CAD, HTN, HLD, DMII, and CKD Stage 4  States she is trying to do more and she is doing light housework.   States she is weighting every day and her weight was 221.9 today.  Denies any swelling, chest pains or shortness of breath.  States her B/P reading have been good and her reading today was 103/64.  State her blood sugars range from 112-170 in the morning and can be in the 200's right after eating.   Denies any hypoglycemia but she did start to feel low when she was at 112.  States she is taking 24 units of Toujeo in the evenings. States she is taking 5- 7 units  at meal time depending on her sugar and she does not take if her blood sugar is lower.  States she tries to follow a low sodium diabetes diet. States she is trying to follow her fluid restriction but she does like ice cold water. States she is using her cane and denies any recent falls.    RNCM Clinical Goal(s):  Patient will  verbalize understanding of plan for management of Atrial Fibrillation, CHF, CAD, HTN, HLD, DMII, and CKD Stage 4 as evidenced by voiced adherence to plan of care verbalize basic understanding of  Atrial Fibrillation, CHF, CAD, HTN, HLD, and DMII disease process and self health management plan as evidenced by voiced understanding and teach back take all medications exactly as prescribed and will call provider for medication related questions as  evidenced by dispense report and pt verbalization  attend all scheduled medical appointments: Dr. [name] 09/19/22, Coumadin clinic 828/23, CCM PharmD 06/01/22,Annual Wellness Visit 02/20/23, cardiology 07/08/22,Endocrinology 08/01/22, Nephrology December 2023 as evidenced by medical records demonstrate Improved adherence to prescribed treatment plan for Atrial Fibrillation, CHF, CAD, HTN, HLD, DMII, and CKD Stage 4 as evidenced by readings within limits, voiced adherence to plan of care continue to work with RN Care Manager to address care management and care coordination needs related to  Atrial Fibrillation, CHF, CAD, HTN, HLD, DMII, and CKD Stage 4 as evidenced by adherence to CM Team Scheduled appointments through collaboration with RN Care manager, provider, and care team.   Interventions: 1:1 collaboration with primary care provider regarding development and update of comprehensive plan of care as evidenced by provider attestation and co-signature Inter-disciplinary care team collaboration (see longitudinal plan of care) Evaluation of current treatment plan related to  self management and patient's adherence to plan as established by provider   AFIB Interventions: (Status:  Goal on track:  Yes.) Long Term Goal   Counseled on increased risk of stroke due to Afib and benefits of anticoagulation for stroke prevention Reviewed importance of adherence to anticoagulant exactly as prescribed Counseled on bleeding risk associated with Coumadin and importance of  self-monitoring for signs/symptoms of bleeding Counseled on importance of regular laboratory monitoring as prescribed Reinforced foods to limit while taking Coumadin. Reinforced home safety and fall precautions when taking Coumadin   CAD Interventions: (Status:  Goal on track:  Yes.) Long Term Goal Assessed understanding of CAD diagnosis Medications reviewed including medications utilized in CAD treatment plan Provided education on importance of blood pressure control in management of CAD Provided education on Importance of limiting foods high in cholesterol Reviewed Importance of taking all medications as prescribed Reviewed Importance of attending all scheduled provider appointments Advised to report any changes in symptoms or exercise tolerance Reinforced signs and symptoms to call doctor and when to call 911   Heart Failure Interventions:  (Status:  Goal on track:  Yes.) Long Term Goal Basic overview and discussion of pathophysiology of Heart Failure reviewed Provided education on low sodium diet Reviewed Heart Failure Action Plan in depth and provided written copy Discussed importance of daily weight and advised patient to weigh and record daily Reviewed role of diuretics in prevention of fluid overload and management of heart failure; Discussed the importance of keeping all appointments with provider  Reinforced to follow fluid restrictions.  Reinforced to pace her activity.  Reinforced s/sx to call provider and when to take extra diuretic    Chronic Kidney Disease Interventions:  (Status:  Goal on track:  Yes.) Long Term Goal Assessed the Patient understanding of chronic kidney disease    Evaluation of current treatment plan related to chronic kidney disease self management and patient's adherence to plan as established by provider      Reviewed medications with patient and discussed importance of compliance    Advised patient, providing education and rationale, to monitor blood  pressure daily and record, calling PCP for findings outside established parameters    Discussed complications of poorly controlled blood pressure such as heart disease, stroke, circulatory complications, vision complications, kidney impairment, sexual dysfunction    Discussed the impact of chronic kidney disease on daily life and mental health and acknowledged and normalized feelings of disempowerment, fear, and frustration    Last practice recorded BP readings:  BP Readings from Last 3 Encounters:  03/18/22 128/62  03/08/22 140/78  02/21/22 126/72  Most recent eGFR/CrCl:  Lab Results  Component Value Date   EGFR 25 (L) 10/29/2021    No components found for: CRCL    Diabetes Interventions:  (Status:  Goal on track:  Yes.) Long Term Goal Assessed patient's understanding of A1c goal:  <7.5% Provided education to patient about basic DM disease process Reviewed medications with patient and discussed importance of medication adherence Counseled on importance of regular laboratory monitoring as prescribed Discussed plans with patient for ongoing care management follow up and provided patient with direct contact information for care management team Provided patient with written educational materials related to hypo and hyperglycemia and importance of correct treatment Advised patient, providing education and rationale, to check cbg 3-4 times daily and record, calling provider for findings outside established parameters Reinforced to try to eat snacks with protein and lower sodium. Reinforced the rule of 15 and to call endocrinologist if she has frequent lows Lab Results  Component Value Date   HGBA1C 7.3 (A) 01/31/2022   Hyperlipidemia Interventions:  (Status:  Condition stable.  Not addressed this visit.) Long Term Goal Medication review performed; medication list updated in electronic medical record.  Provider established cholesterol goals reviewed Counseled on importance of regular  laboratory monitoring as prescribed Reviewed role and benefits of statin for ASCVD risk reduction Reviewed importance of limiting foods high in cholesterol  Hypertension Interventions:  (Status:  Goal on track:  Yes.) Long Term Goal Last practice recorded BP readings:  BP Readings from Last 3 Encounters:  03/18/22 128/62  03/08/22 140/78  02/21/22 126/72  Most recent eGFR/CrCl:  Lab Results  Component Value Date   EGFR 25 (L) 10/29/2021    No components found for: CRCL  Evaluation of current treatment plan related to hypertension self management and patient's adherence to plan as established by provider Provided education to patient re: stroke prevention, s/s of heart attack and stroke Reviewed medications with patient and discussed importance of compliance Counseled on adverse effects of illicit drug and excessive alcohol use in patients with high blood pressure  Discussed plans with patient for ongoing care management follow up and provided patient with direct contact information for care management team Advised patient, providing education and rationale, to monitor blood pressure daily and record, calling PCP for findings outside established parameters Provided education on prescribed diet low sodium. Low CHO heart healthy Reinforced to try to increase activity as tolerated  Patient Goals/Self-Care Activities: Take all medications as prescribed Attend all scheduled provider appointments Call pharmacy for medication refills 3-7 days in advance of running out of medications Call provider office for new concerns or questions  call office if I gain more than 2 pounds in one day or 5 pounds in one week keep legs up while sitting track weight in diary weigh myself daily follow rescue plan if symptoms flare-up eat more whole grains, fruits and vegetables, lean meats and healthy fats keep appointment with eye doctor check blood sugar at prescribed times: three times daily and when you  have symptoms of low or high blood sugar fill half of plate with vegetables manage portion size Try eating protein with snacks check pulse (heart) rate once a day make a plan to eat healthy keep all lab appointments take medicine as prescribed check blood pressure daily choose a place to take my blood pressure (home, clinic or office, retail store) keep a blood pressure log take blood pressure log to all doctor appointments call for medicine refill 2 or 3 days  before it runs out take all medications exactly as prescribed call doctor with any symptoms you believe are related to your medicine  Follow Up Plan:  Telephone follow up appointment with care management team member scheduled for:  06/28/22 The patient has been provided with contact information for the care management team and has been advised to call with any health related questions or concerns.       Plan:Telephone follow up appointment with care management team member scheduled for:  06/28/22 The patient has been provided with contact information for the care management team and has been advised to call with any health related questions or concerns.  Peter Garter RN, Jackquline [place], CDE Care Management Coordinator [facility] [phone]

## 2022-05-25 ENCOUNTER — Encounter (INDEPENDENT_AMBULATORY_CARE_PROVIDER_SITE_OTHER): Payer: Self-pay

## 2022-06-01 ENCOUNTER — Ambulatory Visit: Payer: Medicare Other | Admitting: Pharmacist

## 2022-06-01 DIAGNOSIS — E113499 Type 2 diabetes mellitus with severe nonproliferative diabetic retinopathy without macular edema, unspecified eye: Secondary | ICD-10-CM

## 2022-06-01 DIAGNOSIS — I10 Essential (primary) hypertension: Secondary | ICD-10-CM

## 2022-06-01 NOTE — Progress Notes (Signed)
Chronic Care Management Pharmacy Note  06/08/2022 Name:  Regina [name] MRN:  [id number] DOB:  November 12, 1940  Summary: A1c not at goal < 7% Pt reports medications are expensive  Recommendations/Changes made from today's visit: -Recommended follow up with endo for insulin dose adjustments -Mailed healthy plate handout   Plan: Follow up for BP and DM assessment in 2 months Follow up in 6 months  Subjective: Regina [name] is an 81 y.o. year old female who is a primary patient of [name], Malka So, MD.  The CCM team was consulted for assistance with disease management and care coordination needs.    Engaged with patient by telephone for follow up visit in response to provider referral for pharmacy case management and/or care coordination services.   Consent to Services:  The patient was given information about Chronic Care Management services, agreed to services, and gave verbal consent prior to initiation of services.  Please see initial visit note for detailed documentation.   Patient Care Team: [name], [name], MD as PCP - General (Family Medicine) O'Neal, Cassie Freer, MD as PCP - Cardiology (Internal Medicine) Viona Gilmore, [facility] as Pharmacist (Pharmacist)  Recent office visits: 03/18/22 [name] MD - Patient was seen for Gastroesophageal reflux disease, unspecified whether esophagitis present and additional concerns. Started fluocinolone Acetonide drops and Ketoconazole 2% topical.  Follow up in 6 months.  02/16/22 [name] LPN - Patient was seen for Medicare annual wellness exam.   01/04/22 [name] MD: Patient presented for ED visit follow up. Resume KlorCon 20 mEq today. Prescribed pantoprazole 20 mg daily.  Recent consult visits: 05/13/22 Frederik Schmidt, RN (cardiology): Patient presented for anti-coag visit. INR 2.5, goal 2-3. Continued warfarin 2.5 mg (5 mg x 0.5) every day.  03/08/22 [name] MD (cardiology) - Patient was seen for Persistent atrial  fibrillation and additional issues. No medication changes. Follow up in 4 months.    02/21/22 [name] MD (pulmonary) - Patient was seen for OSA (obstructive sleep apnea).  No medication changes. Follow up in 6 months.    02/11/22 [name] DPM (podiatry) - Pain due to onychomycosis of toenail of left foot. No medication changes. Follow up in 3 months.   01/31/22 Collier Flowers MD (endocrinology) - Patient was seen for hyperglycemia and additional issues. Referral to Podiatry. Decreased Toujeo to 24 units at bedtime. Increased Humalog Kwikpen 100 un/ml to max daily 30 units. Follow up in 6 months.  01/04/22 Geralynn Rile MD (Cardiology): Patient presented for Afib follow up. Referred placed for home health. D/c'd furosemide and started torsemide 40 mg 2 tablets daily and potassium 20 mEq daily.  12/13/21 Rexene Edison, NP (pulmonary): Patient presented for OSA follow up. Patient does want to get on OSA.  11/23/21 Geralynn Rile MD (Cardiology): Patient presented for Afib follow up.  09/01/2021 Geralynn Rile MD (cardiology) - Patient was seen for Hereditary Cardiac Amyloidosis and additional issues. Take Torsemide 40 mg twice daily for 3 days, then decrease to once daily. Follow up if symptoms worsen or fail to improve.   08/13/2021 Cloyd Stagers MD (endocrinology) - Patient was seen for Type 2 diabetes mellitus and additional issues. No medication changes. No follow up noted  06/18/21 Marcial Pacas, MD (neurology): Patient presented for initial visit for neck pain and headache. A1c increased to 7.2%.   Hospital visits: Patient visited [facility] ER on 01/04/22 due to concussion with unknown loss of conscious status and was present for  2 hours.   New? Medications Started at [facility] Discharge:?? -started None.   Medication Changes at Hospital Discharge: -Changed None.    Medications Discontinued at Hospital Discharge: -Stopped None.     Medications that remain the same after Hospital Discharge:?? -All other medications will remain the same.   Patient admitted to [facility] on 3/10-3/14/23 due to chest pain.   New? Medications Started at [facility] Discharge:?? -started furosemide and aspirin 81 mg daily.   Medication Changes at Hospital Discharge: -Changed None.    Medications Discontinued at Hospital Discharge: -Stopped torsemide and tizanidine.    Medications that remain the same after Hospital Discharge:?? -All other medications will remain the same.       Objective:  Lab Results  Component Value Date   CREATININE 2.2 (A) 05/17/2022   BUN 46 (A) 05/17/2022   GFR 24.47 (L) 08/13/2021   GFRNONAA 23 (L) 01/12/2022   GFRAA 26 10/20/2021   NA 138 05/17/2022   K 4.1 05/17/2022   CALCIUM 9.2 05/17/2022   CO2 31 (A) 05/17/2022   GLUCOSE 161 (H) 01/12/2022    Lab Results  Component Value Date/Time   HGBA1C 7.3 (A) 01/31/2022 01:56 PM   HGBA1C 7.6 (A) 08/13/2021 02:25 PM   HGBA1C 7.9 (H) 08/11/2021 08:15 AM   HGBA1C 7.2 (H) 06/18/2021 11:10 AM   FRUCTOSAMINE 325 (H) 09/09/2019 08:57 AM   FRUCTOSAMINE 349 (H) 05/22/2019 04:31 PM   GFR 24.47 (L) 08/13/2021 03:01 PM   GFR 20.67 (L) 02/15/2021 11:32 AM   MICROALBUR <0.7 08/13/2021 03:01 PM   MICROALBUR 1.9 05/22/2019 04:31 PM    Last diabetic Eye exam:  Lab Results  Component Value Date/Time   HMDIABEYEEXA No Retinopathy 05/18/2022 12:00 AM    Last diabetic Foot exam: No results found for: "HMDIABFOOTEX"   Lab Results  Component Value Date   CHOL 115 08/13/2021   HDL 54.80 08/13/2021   LDLCALC 40 08/13/2021   TRIG 101.0 08/13/2021   CHOLHDL 2 08/13/2021       Latest Ref Rng & Units 05/17/2022   12:00 AM 10/20/2021   12:00 AM 04/25/2020    1:22 AM  Hepatic Function  Total Protein 6.5 - 8.1 g/dL   7.7   Albumin 3.5 - 5.0 3.9     4.1     3.6   AST 15 - 41 U/L   24   ALT 0 - 44 U/L   18   Alk Phosphatase 38 - 126 U/L   51    Total Bilirubin 0.3 - 1.2 mg/dL   0.7   Bilirubin, Direct 0.0 - 0.2 mg/dL   <0.1      This result is from an external source.    Lab Results  Component Value Date/Time   TSH 0.65 06/09/2021 11:06 AM   TSH 3.24 06/08/2020 08:35 AM       Latest Ref Rng & Units 05/17/2022   12:00 AM 01/04/2022    6:43 AM 12/28/2021    2:51 AM  CBC  WBC 4.0 - 10.5 K/uL  6.8  7.6   Hemoglobin 12.0 - 16.0 12.0     13.3  12.5   Hematocrit 36.0 - 46.0 %  41.0  39.9   Platelets 150 - 400 K/uL  290  269      This result is from an external source.    Lab Results  Component Value Date/Time   VD25OH 55.3 03/24/2020 02:28 PM   VD25OH 59.65 05/22/2019 04:31 PM  Clinical ASCVD: Yes  The ASCVD Risk score (Arnett DK, et al., 2019) failed to calculate for the following reasons:   The patient has a prior MI or stroke diagnosis       03/18/2022   12:03 PM 02/16/2022   10:00 AM 01/04/2022    2:07 PM  Depression screen PHQ 2/9  Decreased Interest 0 0 0  Down, Depressed, Hopeless 0 0 0  PHQ - 2 Score 0 0 0    CHA2DS2/VAS Stroke Risk Points  Current as of 11 minutes ago     9 >= 2 Points: High Risk  1 - 1.99 Points: Medium Risk  0 Points: Low Risk    Last Change:       Details    This score determines the patient's risk of having a stroke if the  patient has atrial fibrillation.       Points Metrics  1 Has Congestive Heart Failure:  Yes    Current as of 11 minutes ago  1 Has Vascular Disease:  Yes    Current as of 11 minutes ago  1 Has Hypertension:  Yes    Current as of 11 minutes ago  2 Age:  24    Current as of 11 minutes ago  1 Has Diabetes:  Yes    Current as of 11 minutes ago  2 Had Stroke:  Yes  Had TIA:  No  Had Thromboembolism:  No    Current as of 11 minutes ago  1 Female:  Yes    Current as of 11 minutes ago      Social History   Tobacco Use  Smoking Status Never  Smokeless Tobacco Never   BP Readings from Last 3 Encounters:  03/18/22 128/62  03/08/22 140/78  02/21/22  126/72   Pulse Readings from Last 3 Encounters:  03/18/22 90  03/08/22 87  02/21/22 75   Wt Readings from Last 3 Encounters:  03/18/22 223 lb 6 oz (101.3 kg)  03/08/22 224 lb 12.8 oz (102 kg)  02/21/22 220 lb 4.8 oz (99.9 kg)   BMI Readings from Last 3 Encounters:  03/18/22 40.86 kg/m  03/08/22 41.12 kg/m  02/21/22 40.29 kg/m    Assessment/Interventions: Review of patient past medical history, allergies, medications, health status, including review of consultants reports, laboratory and other test data, was performed as part of comprehensive evaluation and provision of chronic care management services.   SDOH:  (Social Determinants of Health) assessments and interventions performed: Yes   SDOH Screenings   Alcohol Screen: Low Risk  (02/16/2022)   Alcohol Screen    Last Alcohol Screening Score (AUDIT): 0  Depression (PHQ2-9): Low Risk  (03/18/2022)   Depression (PHQ2-9)    PHQ-2 Score: 0  Financial Resource Strain: Low Risk  (02/16/2022)   Overall Financial Resource Strain (CARDIA)    Difficulty of Paying Living Expenses: Not hard at all  Food Insecurity: No Food Insecurity (02/16/2022)   Hunger Vital Sign    Worried About Running Out of Food in the Last Year: Never true    Ran Out of Food in the Last Year: Never true  Housing: Low Risk  (02/16/2022)   Housing    Last Housing Risk Score: 0  Physical Activity: Inactive (02/16/2022)   Exercise Vital Sign    Days of Exercise per Week: 0 days    Minutes of Exercise per Session: 0 min  Social Connections: Moderately Integrated (02/16/2022)   Social Connection and Isolation Panel [NHANES]  Frequency of Communication with Friends and Family: More than three times a week    Frequency of Social Gatherings with Friends and Family: More than three times a week    Attends Religious Services: More than 4 times per year    Active Member of [employer] or Organizations: Yes    Attends Archivist Meetings: More than 4 times per year     Marital Status: Widowed  Stress: No Stress Concern Present (02/16/2022)   [place]    Feeling of Stress : Not at all  Tobacco Use: Low Risk  (05/24/2022)   Patient History    Smoking Tobacco Use: Never    Smokeless Tobacco Use: Never    Passive Exposure: Not on file  Transportation Needs: No Transportation Needs (02/16/2022)   PRAPARE - Transportation    Lack of Transportation (Medical): No    Lack of Transportation (Non-Medical): No   CCM Care Plan  Allergies  Allergen Reactions   Ace Inhibitors Other (See Comments)    unknown   Amlodipine Other (See Comments)    unknown   Atenolol Other (See Comments)    bradycardia   Avandia [Rosiglitazone] Other (See Comments)    unknown   Darvon [Propoxyphene] Other (See Comments)    unknown   Erythromycin Itching   Hydralazine Other (See Comments)    Burning in throat and chest   Hydrocodone Other (See Comments)    Hallucinations.   Levofloxacin Itching   Morphine And Related Other (See Comments)    Dizzy and hallucianation, vomiting; Willing to try low dose   Percocet [Oxycodone-Acetaminophen] Other (See Comments)    hallucination   Spironolactone Other (See Comments)    unknown   Tramadol Other (See Comments)    Unknown/does not recall reaction but does not want to take again    Medications Reviewed Today     Reviewed by Viona Gilmore, [facility] (Pharmacist) on 06/01/22 at 1427  Med List Status: <None>   Medication Order Taking? Sig Documenting Provider Last Dose Status Informant  acetaminophen (TYLENOL) 500 MG tablet [id number] No Take 1,000 mg by mouth every 6 (six) hours as needed for mild pain. [provider] Taking Active Self  Alcohol Swabs (B-D SINGLE USE SWABS REGULAR) PADS [id number] No Use to test blood sugar up to 3 times daily Shamleffer, Melanie Crazier, MD Taking Active Self  allopurinol (ZYLOPRIM) 100 MG tablet [id number]  TAKE ONE TABLET BY  MOUTH EVERY MORNING [name], [name], MD  Active   aspirin EC 81 MG EC tablet [id number] No Take 1 tablet (81 mg total) by mouth daily. Swallow whole. Pokhrel, Corrie Mckusick, MD Taking Active Self  atorvastatin (LIPITOR) 20 MG tablet [id number] No TAKE ONE TABLET BY MOUTH EVERYDAY AT BEDTIME [name], [name], MD Taking Active Self  Blood Glucose Monitoring Suppl (ACCU-CHEK GUIDE) w/Device KIT [id number] No 1 Device by Does not apply route 3 (three) times daily. [name], [name], MD Taking Active Self  Capsaicin 0.033 % CREA [id number] No Apply 1 application topically 3 (three) times daily.  Patient taking differently: Apply 1 application. topically 3 (three) times daily. As needed   Shamleffer, Melanie Crazier, MD Taking Active Self  Cholecalciferol (VITAMIN D3) 50 MCG (2000 UT) capsule [id number] No Take 2,000 Units by mouth daily. [provider] Taking Active Self  Continuous Blood Gluc Sensor (DEXCOM G6 SENSOR) MISC [id number] No 1 Device by Does not apply route as directed. Shamleffer, Melanie Crazier, MD Taking Active  Continuous Blood Gluc Transmit (DEXCOM G6 TRANSMITTER) MISC [id number] No 1 Device by Does not apply route as directed. Shamleffer, Melanie Crazier, MD Taking Active   famotidine (PEPCID) 20 MG tablet [id number] No Take 1 tablet (20 mg total) by mouth at bedtime. [name], [name], MD Taking Active Self  FARXIGA 10 MG TABS tablet [id number]  TAKE ONE TABLET BY MOUTH ONCE DAILY O'Neal, Cassie Freer, MD  Active   Fluocinolone Acetonide 0.01 % OIL [id number]  instill one drop in ears every 2-3 DAYS AS NEEDED FOR pruritis [name], [name], MD  Active   gabapentin (NEURONTIN) 600 MG tablet [id number] No TAKE ONE TABLET BY MOUTH EVERY MORNING and TAKE ONE TABLET BY MOUTH EVERYDAY AT BEDTIME [name], [name], MD Taking Active   glucose blood (ACCU-CHEK GUIDE) test strip [id number] No Use to test blood sugar up to 3 times daily Shamleffer, Melanie Crazier, MD Taking Active Self  insulin glargine, 1 Unit  Dial, (TOUJEO SOLOSTAR) 300 UNIT/ML Solostar Pen [id number] No Inject 24 Units into the skin daily. Eat a snack with protein nightly before bedtime. Shamleffer, Melanie Crazier, MD Taking Active   insulin lispro (HUMALOG KWIKPEN) 100 UNIT/ML KwikPen [id number] No Max daily 30 units Shamleffer, Melanie Crazier, MD Taking Active   Insulin Pen Needle 32G X 4 MM MISC [id number] No 1 Device by Does not apply route in the morning, at noon, in the evening, and at bedtime. Shamleffer, Melanie Crazier, MD Taking Active   ipratropium (ATROVENT) 0.06 % nasal spray [id number] No Place 2 sprays into both nostrils 4 (four) times daily.  Patient taking differently: Place 2 sprays into both nostrils 4 (four) times daily. As needed   [name], [name], MD Taking Active Self  ketoconazole (NIZORAL) 2 % shampoo [id number]  Apply 1 application. topically 2 (two) times a week. [name], [name], MD  Active   Lancets Misc. (ACCU-CHEK FASTCLIX LANCET) KIT [id number] No Use to test blood sugar up to 3 times daily Shamleffer, Melanie Crazier, MD Taking Active Self  levothyroxine (SYNTHROID) 150 MCG tablet [id number]  TAKE ONE TABLET BY MOUTH BEFORE Elita Boone [name], [name], MD  Active   magnesium oxide (MAG-OX) 400 MG tablet [id number]  TAKE ONE TABLET BY MOUTH EVERY MORNING [name], [name], MD  Active   meclizine (ANTIVERT) 25 MG tablet [id number] No Take 25 mg by mouth daily as needed for dizziness. [provider] Taking Active Self           Med Note Jimmey Ralph, [facility]   Fri Dec 24, 2021  6:14 PM)    metoprolol succinate (TOPROL-XL) 25 MG 24 hr tablet [id number] No Take 1 tablet (25 mg total) by mouth daily. Take with or immediately following a meal. O'Neal, Cassie Freer, MD Taking Active Self  nitroGLYCERIN (NITROSTAT) 0.4 MG SL tablet [id number]  Dissolve 1 tab under tongue as needed for chest pain. May repeat every 5 minutes x 2 doses. If no relief call 9-1-1. Geralynn Rile, MD  Active   pantoprazole  (PROTONIX) 20 MG tablet [id number] No Take 1 tablet (20 mg total) by mouth daily. [name], [name], MD Taking Active   Polyethylene Glycol 3350 (MIRALAX PO) [id number] No Take 1 Package by mouth daily as needed (constipation). [provider] Taking Active Self  potassium chloride SA (KLOR-CON M20) 20 MEQ tablet [id number] No Take 1 tablet (20 mEq total) by mouth daily. Geralynn Rile, MD Taking Expired 04/04/22 2359   Propylene Glycol (SYSTANE BALANCE OP) [id number] No Place 1  drop into both eyes daily as needed (for dry eyes). [provider] Taking Active Self  SALINE MIST SPRAY NA [id number] No Place 2 sprays into the nose at bedtime. [provider] Taking Active Self  Tafamidis 61 MG CAPS [id number] No Take 61 mg by mouth daily. Geralynn Rile, MD Taking Active Self  torsemide [facility]) 20 MG tablet [id number] No Take 2 tablets (40 mg total) by mouth daily. Geralynn Rile, MD Taking Expired 04/04/22 2878   trolamine salicylate (ASPERCREME) 10 % cream [id number] No Apply 1 application. topically as needed for muscle pain. [provider] Taking Active Self  warfarin (COUMADIN) 5 MG tablet [id number]  TAKE 1/2 TO 1 TABLET BY MOUTH ONCE daily OR AS DIRECTED by THE coumadin clinic [place], Cassie Freer, MD  Active   Med List Note Pat Patrick, [facility] 12/27/21 1157): `            Patient Active Problem List   Diagnosis Date Noted   Pain due to onychomycosis of toenail of left foot 02/11/2022   Chest pain 12/26/2021   Left atrial thrombus 12/25/2021   Wild-type transthyretin-related (ATTR) amyloidosis ([place])    Lung nodules 12/13/2021   Polymyalgia rheumatica ([place]) 06/30/2021   Neck pain 06/18/2021   Gait abnormality 06/18/2021   Memory loss 06/18/2021   Atherosclerosis of aorta ([place]) 06/09/2021   Type 2 diabetes mellitus with stage 3a chronic kidney disease, with long-term current use of insulin ([place]) 09/14/2020   Type 2 diabetes  mellitus with retinopathy, with long-term current use of insulin ([place]) 09/14/2020   Diabetes mellitus ([place]) 09/14/2020   Type 2 diabetes mellitus with diabetic polyneuropathy, with long-term current use of insulin ([place]) 09/14/2020   Long term (current) use of anticoagulants 07/13/2020   Persistent atrial fibrillation (HCC)    Mixed hyperlipidemia    Acute on chronic diastolic CHF (congestive heart failure), NYHA class 3 ([place])    Demand ischemia ([place])    Atrial fibrillation ([place]) 04/24/2020   Other fatigue 03/24/2020   Short of breath on exertion 03/24/2020   Congestive heart failure ([place]) 03/24/2020   Vitamin D deficiency 03/24/2020   Sleep apnea 05/21/2019   History of hepatitis C 05/21/2019   Insomnia 05/21/2019   CKD (chronic kidney disease) stage 4, GFR 15-29 ml/min (HCC) 05/21/2019   GERD (gastroesophageal reflux disease) 04/20/2018   Type 2 diabetes mellitus with diabetic neuropathy, unspecified ([place]) 12/19/2016   History of TIA (transient ischemic attack) 12/19/2016   Hyperlipidemia associated with type 2 diabetes mellitus ([place]) 12/19/2016   S/P total knee replacement using cement, right 03/18/2015   Non-obstructive CAD    Cardiovascular stress test abnormal    Pleuritic chest pain 01/16/2015   Acute renal failure superimposed on stage 3 chronic kidney disease ([place]) 01/16/2015   Obesity, Class III, BMI 40-49.9 (morbid obesity) ([place]) 01/16/2015   Essential hypertension 01/16/2015   Hypothyroidism 01/16/2015   OSA on CPAP 01/16/2015   DM type 2 (diabetes mellitus, type 2) ([place]) 01/16/2015   Coronary artery disease involving native coronary artery without angina pectoris 10/02/2014   Physical deconditioning 02/28/2013   Morbid obesity ([place]) 02/27/2013   Osteoarthritis 02/27/2013    Immunization History  Administered Date(s) Administered   Fluad Quad(high Dose 65+) 06/21/2019   Influenza,inj,Quad PF,6+ Mos 01/17/2015   Influenza-Unspecified 01/17/2015, 10/25/2016    PFIZER(Purple Top)SARS-COV-2 Vaccination 11/06/2019, 11/27/2019, 08/12/2020, 10/23/2021   PNEUMOCOCCAL CONJUGATE-20 12/28/2021   Pneumococcal Polysaccharide-23 05/01/2020   Zoster Recombinat (Shingrix) 10/23/2021   Patient is trying  to apply for Medicaid right now. She can't live by herself because of heart conditions.  Patient is trying to eat when she gets hungry. Patient doesn't have too much to eat now because of that. She is really watching what she is eating. Patient reports she has one low when she went to church but didn't eat much. Patient is trying to live and really trying to take care of herself. Patient is going to make a salad for dinner tonight with noodles.   Conditions to be addressed/monitored:  Hypertension, Hyperlipidemia, Diabetes, Atrial Fibrillation, Heart Failure, Coronary Artery Disease, GERD, Chronic Kidney Disease, Hypothyroidism, Osteoarthritis, and vitamin D deficiency  Conditions addressed this visit: Diabetes, hypertension  Care Plan : CCM Pharmacy Care Plan  Updates made by Viona Gilmore, [place] since 06/08/2022 12:00 AM     Problem: Problem: Hypertension, Hyperlipidemia, Diabetes, Atrial Fibrillation, Heart Failure, Coronary Artery Disease, GERD, Chronic Kidney Disease, Hypothyroidism, Osteoarthritis, and vitamin D deficiency      Long-Range Goal: Patient-Specific Goal   Start Date: 04/13/2021  Expected End Date: 04/13/2022  Recent Progress: On track  Priority: High  Note:   Current Barriers:  Unable to independently monitor therapeutic efficacy Unable to achieve control of diabetes   Pharmacist Clinical Goal(s):  Patient will achieve adherence to monitoring guidelines and medication adherence to achieve therapeutic efficacy achieve control of diabetes as evidenced by A1c  through collaboration with PharmD and provider.   Interventions: 1:1 collaboration with [name], [name], MD regarding development and update of comprehensive plan of care as  evidenced by provider attestation and co-signature Inter-disciplinary care team collaboration (see longitudinal plan of care) Comprehensive medication review performed; medication list updated in electronic medical record  Hypertension (BP goal <130/80) -Controlled -Current treatment: Metoprolol succinate 25 mg 1 tablet daily - Appropriate, Effective, Safe, Accessible -Medications previously tried: none  -Current home readings: none to report -Current dietary habits: limits salt intake -Current exercise habits: trying to walk some -Denies hypotensive/hypertensive symptoms -Educated on Exercise goal of 150 minutes per week; Importance of home blood pressure monitoring; Proper BP monitoring technique; -Counseled to monitor BP at home at least weekly, document, and provide log at future appointments -Counseled on diet and exercise extensively Recommended bringing BP cuff to next appointment to compare readings and make sure her machine is not off. Patient reports she has been feeling out of sorts.  Hyperlipidemia: (LDL goal < 70) -Controlled -Current treatment: Atorvastatin 20 mg 1 tablet daily - Appropriate, Effective, Safe, Accessible -Medications previously tried: fish oil  -Current dietary patterns: does not fry foods -Current exercise habits: trying to walk some -Educated on Cholesterol goals;  Importance of limiting foods high in cholesterol; Exercise goal of 150 minutes per week; -Counseled on diet and exercise extensively Recommended to continue current medication Recommended to stop fish oil as she likely does not need based on triglycerides.  CAD (Goal: prevent heart events) -Controlled -Current treatment  Warfarin 5 mg as directed by coumadin clinic  - Appropriate, Effective, Safe, Accessible Nitroglycerin 0.4 mg 1 tablet as needed - Appropriate, Effective, Safe, Accessible Aspirin 81 mg 1 tablet daily - Appropriate, Effective, Query Safe, Accessible -Medications  previously tried: none  -Recommended checking nitroglycerin expiration date and making sure her on supply has not expired.  Diabetes (A1c goal <7%) -Not ideally controlled -Current medications: Farxiga 10 mg 1 tablet daily - Appropriate, Effective, Safe, Query accessible Toujeo U-300 inject 24 units nightly - Appropriate, Effective, Safe, Accessible Humalog U-100 max 15 units daily (sliding scale) -  Appropriate, Effective, Safe, Accessible -Medications previously tried: n/a  -Current home glucose readings fasting glucose: 115, 3 weeks ago 55 post prandial glucose: 99, sometimes up to 200, 220 -Denies hypoglycemic/hyperglycemic symptoms -Current meal patterns:  breakfast: peaches, 2 eggs lunch: same as dinner dinner: sometimes potatoes with protein snacks: popcorn, graham crackers drinks: n/a -Current exercise: trying to walk some -Educated on A1c and blood sugar goals; Continuous glucose monitoring; Carbohydrate counting and/or plate method -Counseled to check feet daily and get yearly eye exams -Counseled on diet and exercise extensively Recommended to continue current medication Recommended eating carbs with breakfast in addition to protein.    Atrial Fibrillation (Goal: prevent stroke and major bleeding) -Controlled -CHADSVASC: 9 -Current treatment: Rate control: Rate control: metoprolol succinate 25 mg 1 tablet daily - Appropriate, Effective, Safe, Accessible Anticoagulation: warfarin 5 mg as directed by coumadin clinic - Appropriate, Effective, Safe, Accessible -Medications previously tried: n/a -Home BP and HR readings: refer to above  -Counseled on increased risk of stroke due to Afib and benefits of anticoagulation for stroke prevention; avoidance of NSAIDs due to increased bleeding risk with anticoagulants; -Counseled on diet and exercise extensively Recommended to continue current medication Educated on keeping green intake consistent with warfarin use.  Heart  Failure (Goal: manage symptoms and prevent exacerbations) -Controlled -Last ejection fraction: 10-15% (Date: 08/05/20) -HF type: Systolic -NYHA Class: III (marked limitation of activity) -AHA HF Stage: C (Heart disease and symptoms present) -Current treatment: metoprolol succinate 25 mg 1 tablet daily - Appropriate, Effective, Safe, Accessible Torsemide 20 mg 1 tablet twice daily - Appropriate, Effective, Safe, Accessible Potassium chloride 20 mEq 1 tablet daily - Appropriate, Effective, Safe, Accessible -Medications previously tried: n/a  -Current home BP/HR readings: refer to above -Current dietary habits: uses Mrs. Dash and doesn't use salt -Current exercise habits: trying to walk some -Educated on Importance of weighing daily; if you gain more than 3 pounds in one day or 5 pounds in one week, call cardiologist Proper diuretic administration and potassium supplementation Importance of blood pressure control -Counseled on dosing of torsemide and recommended verifying dose with cardiology. Patient's weight has been fluctuating and she is taking 2 daily for the last few days which may explain her lower BP.  Hypothyroidism (Goal: TSH 0.35-4.5) -Controlled -Current treatment  Levothyroxine 150 mcg 1 tablet daily - Appropriate, Effective, Safe, Accessible -Medications previously tried: none  -Counseled on importance of timing with this medication and taking on an empty stomach. Patient will set an alarm to remember to take daily.  Restless legs syndrome/neuropathy (Goal: minimize symptoms) -Controlled -Current treatment  Gabapentin 600 mg 1 tablet twice daily - Appropriate, Effective, Safe, Accessible -Medications previously tried: none  -Recommended to continue current medication  Gout (Goal: prevent flare ups) -Not ideally controlled -Current treatment  Allopurinol 100 mg 1 tablet daily - Appropriate, Effective, Safe, Accessible -Medications previously tried: none  -Recommended  uric acid level. Patient's hands flare up all the time and she is unsure if this is gout.  Cardiac amyloidosis  (Goal: minimize symptoms) -Controlled -Current treatment  Tafamidis 61 mg 1 capsule daily - Appropriate, Effective, Safe, Accessible -Medications previously tried: none  -Recommended to continue current medication Assessed patient finances. Patient reports this is expensive but likely wouldn't qualify for assistance.  GERD (Goal: minimize symptoms) -Controlled -Current treatment  Famotidine 20 mg 1 tablet at bedtime as needed - Appropriate, Query effective, Safe, Accessible Pantoprazole 20 mg 1 tablet daily - not started -Medications previously tried: pantoprazole  -Recommended to continue current  medication Counseled on differences between pantoprazole and famotidine.  Allergic rhinitis (Goal: minimize symptoms) -Controlled -Current treatment  Cetirizine 10 mg 1 tablet daily - Appropriate, Effective, Safe, Accessible Fluticasone 50 mcg/act spray in both nostrils as needed - Appropriate, Effective, Safe, Accessible -Medications previously tried: none  -Recommended to continue current medication   Health Maintenance -Vaccine gaps: shingles, tetanus, COVID booster -Current therapy:  Meclizine 25 mg 1 tablet as needed Magnesium oxide 400 mg 1 tablet once daily Systane balance 1 drop in both eyes as needed Vitamin C 1000 mg 1 tablet daily  Lotrisone cream as needed Diclofenac gel 1% as needed Vitamin D 50 mcg daily (2000 units) Aspercreme as needed -Educated on Cost vs benefit of each product must be carefully weighed by individual consumer -Patient is satisfied with current therapy and denies issues -Recommended to continue current medication Recommended stopping vitamin C as there is no indication.  Patient Goals/Self-Care Activities Patient will:  - take medications as prescribed focus on medication adherence by setting an alarm to remember  levothyroxine. check blood pressure at least weekly, document, and provide at future appointments weigh daily, and contact provider if weight gain of > 3 lbs in one day or > 5 lbs in one week target a minimum of 150 minutes of moderate intensity exercise weekly  Follow Up Plan: Telephone follow up appointment with care management team member scheduled for: 6 months       Medication Assistance: None required.  Patient affirms current coverage meets needs.  Compliance/Adherence/Medication fill history: Care Gaps: Shingrix (2nd dose), COVID booster, influenza Last BP - 128/62 on 03/18/2022 Last A1C - 7.3 on 01/31/2022  Star-Rating Drugs: Atorvastatin 39m - last [facility]/2022 [facility]- last [facility] on 05/19/2022 [facility] at [facility]  Patient's preferred pharmacy is:  [facility] [place] [address] [id number]: [phone]: [phone] Uses pill box? Yes Pt endorses 95% compliance  We discussed: Benefits of medication synchronization, packaging and delivery as well as enhanced pharmacist oversight with [facility]. Patient decided to: Utilize [facility] pharmacy for medication synchronization, packaging and delivery  Care Plan and Follow Up Patient Decision:  Patient agrees to Care Plan and Follow-up.  Plan: Telephone follow up appointment with care management team member scheduled for:  6 months  MaJeni SallesPharmD, BCAlmaharmacist [place] [phone]

## 2022-06-08 NOTE — Patient Instructions (Signed)
Hi Leonardo,  It was great to catch up again! Be on the lookout for the healthy plate handout we discussed that I am putting in the mail.  Please reach out to me if you have any questions or need anything before our follow up!  Best, [name]  [name], PharmD, [facility]   Visit Information   Goals Addressed   None    Patient Care Plan: CCM Pharmacy Care Plan     Problem Identified: Problem: Hypertension, Hyperlipidemia, Diabetes, Atrial Fibrillation, Heart Failure, Coronary Artery Disease, GERD, Chronic Kidney Disease, Hypothyroidism, Osteoarthritis, and vitamin D deficiency      Long-Range Goal: Patient-Specific Goal   Start Date: 04/13/2021  Expected End Date: 04/13/2022  Recent Progress: On track  Priority: High  Note:   Current Barriers:  Unable to independently monitor therapeutic efficacy Unable to achieve control of diabetes   Pharmacist Clinical Goal(s):  Patient will achieve adherence to monitoring guidelines and medication adherence to achieve therapeutic efficacy achieve control of diabetes as evidenced by A1c  through collaboration with PharmD and provider.   Interventions: 1:1 collaboration with [name], [name], MD regarding development and update of comprehensive plan of care as evidenced by provider attestation and co-signature Inter-disciplinary care team collaboration (see longitudinal plan of care) Comprehensive medication review performed; medication list updated in electronic medical record  Hypertension (BP goal <130/80) -Controlled -Current treatment: Metoprolol succinate 25 mg 1 tablet daily - Appropriate, Effective, Safe, Accessible -Medications previously tried: none  -Current home readings: none to report -Current dietary habits: limits salt intake -Current exercise habits: trying to walk some -Denies hypotensive/hypertensive symptoms -Educated on Exercise goal of 150 minutes  per week; Importance of home blood pressure monitoring; Proper BP monitoring technique; -Counseled to monitor BP at home at least weekly, document, and provide log at future appointments -Counseled on diet and exercise extensively Recommended bringing BP cuff to next appointment to compare readings and make sure her machine is not off. Patient reports she has been feeling out of sorts.  Hyperlipidemia: (LDL goal < 70) -Controlled -Current treatment: Atorvastatin 20 mg 1 tablet daily - Appropriate, Effective, Safe, Accessible -Medications previously tried: fish oil  -Current dietary patterns: does not fry foods -Current exercise habits: trying to walk some -Educated on Cholesterol goals;  Importance of limiting foods high in cholesterol; Exercise goal of 150 minutes per week; -Counseled on diet and exercise extensively Recommended to continue current medication Recommended to stop fish oil as she likely does not need based on triglycerides.  CAD (Goal: prevent heart events) -Controlled -Current treatment  Warfarin 5 mg as directed by coumadin clinic  - Appropriate, Effective, Safe, Accessible Nitroglycerin 0.4 mg 1 tablet as needed - Appropriate, Effective, Safe, Accessible Aspirin 81 mg 1 tablet daily - Appropriate, Effective, Query Safe, Accessible -Medications previously tried: none  -Recommended checking nitroglycerin expiration date and making sure her on supply has not expired.  Diabetes (A1c goal <7%) -Not ideally controlled -Current medications: Farxiga 10 mg 1 tablet daily - Appropriate, Effective, Safe, Query accessible Toujeo U-300 inject 24 units nightly - Appropriate, Effective, Safe, Accessible Humalog U-100 max 15 units daily (sliding scale) - Appropriate, Effective, Safe, Accessible -Medications previously tried: n/a  -Current home glucose readings fasting glucose: 115, 3 weeks ago 55 post prandial glucose: 99, sometimes up to 200, 220 -Denies  hypoglycemic/hyperglycemic symptoms -Current meal patterns:  breakfast: peaches, 2 eggs lunch: same as dinner dinner: sometimes potatoes with protein snacks: popcorn, graham crackers drinks:  n/a -Current exercise: trying to walk some -Educated on A1c and blood sugar goals; Continuous glucose monitoring; Carbohydrate counting and/or plate method -Counseled to check feet daily and get yearly eye exams -Counseled on diet and exercise extensively Recommended to continue current medication Recommended eating carbs with breakfast in addition to protein.    Atrial Fibrillation (Goal: prevent stroke and major bleeding) -Controlled -CHADSVASC: 9 -Current treatment: Rate control: Rate control: metoprolol succinate 25 mg 1 tablet daily - Appropriate, Effective, Safe, Accessible Anticoagulation: warfarin 5 mg as directed by coumadin clinic - Appropriate, Effective, Safe, Accessible -Medications previously tried: n/a -Home BP and HR readings: refer to above  -Counseled on increased risk of stroke due to Afib and benefits of anticoagulation for stroke prevention; avoidance of NSAIDs due to increased bleeding risk with anticoagulants; -Counseled on diet and exercise extensively Recommended to continue current medication Educated on keeping green intake consistent with warfarin use.  Heart Failure (Goal: manage symptoms and prevent exacerbations) -Controlled -Last ejection fraction: 10-15% (Date: 08/05/20) -HF type: Systolic -NYHA Class: III (marked limitation of activity) -AHA HF Stage: C (Heart disease and symptoms present) -Current treatment: metoprolol succinate 25 mg 1 tablet daily - Appropriate, Effective, Safe, Accessible Torsemide 20 mg 1 tablet twice daily - Appropriate, Effective, Safe, Accessible Potassium chloride 20 mEq 1 tablet daily - Appropriate, Effective, Safe, Accessible -Medications previously tried: n/a  -Current home BP/HR readings: refer to above -Current dietary  habits: uses Mrs. Dash and doesn't use salt -Current exercise habits: trying to walk some -Educated on Importance of weighing daily; if you gain more than 3 pounds in one day or 5 pounds in one week, call cardiologist Proper diuretic administration and potassium supplementation Importance of blood pressure control -Counseled on dosing of torsemide and recommended verifying dose with cardiology. Patient's weight has been fluctuating and she is taking 2 daily for the last few days which may explain her lower BP.  Hypothyroidism (Goal: TSH 0.35-4.5) -Controlled -Current treatment  Levothyroxine 150 mcg 1 tablet daily - Appropriate, Effective, Safe, Accessible -Medications previously tried: none  -Counseled on importance of timing with this medication and taking on an empty stomach. Patient will set an alarm to remember to take daily.  Restless legs syndrome/neuropathy (Goal: minimize symptoms) -Controlled -Current treatment  Gabapentin 600 mg 1 tablet twice daily - Appropriate, Effective, Safe, Accessible -Medications previously tried: none  -Recommended to continue current medication  Gout (Goal: prevent flare ups) -Not ideally controlled -Current treatment  Allopurinol 100 mg 1 tablet daily - Appropriate, Effective, Safe, Accessible -Medications previously tried: none  -Recommended uric acid level. Patient's hands flare up all the time and she is unsure if this is gout.  Cardiac amyloidosis  (Goal: minimize symptoms) -Controlled -Current treatment  Tafamidis 61 mg 1 capsule daily - Appropriate, Effective, Safe, Accessible -Medications previously tried: none  -Recommended to continue current medication Assessed patient finances. Patient reports this is expensive but likely wouldn't qualify for assistance.  GERD (Goal: minimize symptoms) -Controlled -Current treatment  Famotidine 20 mg 1 tablet at bedtime as needed - Appropriate, Query effective, Safe, Accessible Pantoprazole  20 mg 1 tablet daily - not started -Medications previously tried: pantoprazole  -Recommended to continue current medication Counseled on differences between pantoprazole and famotidine.  Allergic rhinitis (Goal: minimize symptoms) -Controlled -Current treatment  Cetirizine 10 mg 1 tablet daily - Appropriate, Effective, Safe, Accessible Fluticasone 50 mcg/act spray in both nostrils as needed - Appropriate, Effective, Safe, Accessible -Medications previously tried: none  -Recommended to continue current medication  Health Maintenance -Vaccine gaps: shingles, tetanus, COVID booster -Current therapy:  Meclizine 25 mg 1 tablet as needed Magnesium oxide 400 mg 1 tablet once daily Systane balance 1 drop in both eyes as needed Vitamin C 1000 mg 1 tablet daily  Lotrisone cream as needed Diclofenac gel 1% as needed Vitamin D 50 mcg daily (2000 units) Aspercreme as needed -Educated on Cost vs benefit of each product must be carefully weighed by individual consumer -Patient is satisfied with current therapy and denies issues -Recommended to continue current medication Recommended stopping vitamin C as there is no indication.  Patient Goals/Self-Care Activities Patient will:  - take medications as prescribed focus on medication adherence by setting an alarm to remember levothyroxine. check blood pressure at least weekly, document, and provide at future appointments weigh daily, and contact provider if weight gain of > 3 lbs in one day or > 5 lbs in one week target a minimum of 150 minutes of moderate intensity exercise weekly  Follow Up Plan: Telephone follow up appointment with care management team member scheduled for: 6 months     Patient Care Plan: RN Care Manager Plan of Care     Problem Identified: Chronic Disease Management and Care Coordination Needs (CHF,CAD,DM2 HTN,HLD, Atrial Fib, CKD4)   Priority: High     Long-Range Goal: Establish Plan of Care for Chronic Disease  Management Needs (CHF,CAD,DM2 HTN,HLD, Atrial Fib, CKD4)   Start Date: 12/30/2021  Expected End Date: 12/30/2022  Priority: High  Note:   Current Barriers:  Knowledge Deficits related to plan of care for management of Atrial Fibrillation, CHF, CAD, HTN, HLD, DMII, and CKD Stage 4  Chronic Disease Management support and education needs related to Atrial Fibrillation, CHF, CAD, HTN, HLD, DMII, and CKD Stage 4  States she is trying to do more and she is doing light housework.   States she is weighting every day and her weight was 221.9 today.  Denies any swelling, chest pains or shortness of breath.  States her B/P reading have been good and her reading today was 103/64.  State her blood sugars range from 112-170 in the morning and can be in the 200's right after eating.   Denies any hypoglycemia but she did start to feel low when she was at 112.  States she is taking 24 units of Toujeo in the evenings. States she is taking 5- 7 units  at meal time depending on her sugar and she does not take if her blood sugar is lower.  States she tries to follow a low sodium diabetes diet. States she is trying to follow her fluid restriction but she does like ice cold water. States she is using her cane and denies any recent falls.    RNCM Clinical Goal(s):  Patient will verbalize understanding of plan for management of Atrial Fibrillation, CHF, CAD, HTN, HLD, DMII, and CKD Stage 4 as evidenced by voiced adherence to plan of care verbalize basic understanding of  Atrial Fibrillation, CHF, CAD, HTN, HLD, and DMII disease process and self health management plan as evidenced by voiced understanding and teach back take all medications exactly as prescribed and will call provider for medication related questions as evidenced by dispense report and pt verbalization  attend all scheduled medical appointments: Dr. [name] 09/19/22, Coumadin clinic 828/23, CCM PharmD 06/01/22,Annual Wellness Visit 02/20/23, cardiology  07/08/22,Endocrinology 08/01/22, Nephrology December 2023 as evidenced by medical records demonstrate Improved adherence to prescribed treatment plan for Atrial Fibrillation, CHF, CAD, HTN, HLD, DMII, and  CKD Stage 4 as evidenced by readings within limits, voiced adherence to plan of care continue to work with RN Care Manager to address care management and care coordination needs related to  Atrial Fibrillation, CHF, CAD, HTN, HLD, DMII, and CKD Stage 4 as evidenced by adherence to CM Team Scheduled appointments through collaboration with RN Care manager, provider, and care team.   Interventions: 1:1 collaboration with primary care provider regarding development and update of comprehensive plan of care as evidenced by provider attestation and co-signature Inter-disciplinary care team collaboration (see longitudinal plan of care) Evaluation of current treatment plan related to  self management and patient's adherence to plan as established by provider   AFIB Interventions: (Status:  Goal on track:  Yes.) Long Term Goal   Counseled on increased risk of stroke due to Afib and benefits of anticoagulation for stroke prevention Reviewed importance of adherence to anticoagulant exactly as prescribed Counseled on bleeding risk associated with Coumadin and importance of self-monitoring for signs/symptoms of bleeding Counseled on importance of regular laboratory monitoring as prescribed Reinforced foods to limit while taking Coumadin. Reinforced home safety and fall precautions when taking Coumadin   CAD Interventions: (Status:  Goal on track:  Yes.) Long Term Goal Assessed understanding of CAD diagnosis Medications reviewed including medications utilized in CAD treatment plan Provided education on importance of blood pressure control in management of CAD Provided education on Importance of limiting foods high in cholesterol Reviewed Importance of taking all medications as prescribed Reviewed Importance  of attending all scheduled provider appointments Advised to report any changes in symptoms or exercise tolerance Reinforced signs and symptoms to call doctor and when to call 911   Heart Failure Interventions:  (Status:  Goal on track:  Yes.) Long Term Goal Basic overview and discussion of pathophysiology of Heart Failure reviewed Provided education on low sodium diet Reviewed Heart Failure Action Plan in depth and provided written copy Discussed importance of daily weight and advised patient to weigh and record daily Reviewed role of diuretics in prevention of fluid overload and management of heart failure; Discussed the importance of keeping all appointments with provider  Reinforced to follow fluid restrictions.  Reinforced to pace her activity.  Reinforced s/sx to call provider and when to take extra diuretic    Chronic Kidney Disease Interventions:  (Status:  Goal on track:  Yes.) Long Term Goal Assessed the Patient understanding of chronic kidney disease    Evaluation of current treatment plan related to chronic kidney disease self management and patient's adherence to plan as established by provider      Reviewed medications with patient and discussed importance of compliance    Advised patient, providing education and rationale, to monitor blood pressure daily and record, calling PCP for findings outside established parameters    Discussed complications of poorly controlled blood pressure such as heart disease, stroke, circulatory complications, vision complications, kidney impairment, sexual dysfunction    Discussed the impact of chronic kidney disease on daily life and mental health and acknowledged and normalized feelings of disempowerment, fear, and frustration    Last practice recorded BP readings:  BP Readings from Last 3 Encounters:  03/18/22 128/62  03/08/22 140/78  02/21/22 126/72  Most recent eGFR/CrCl:  Lab Results  Component Value Date   EGFR 25 (L) 10/29/2021    No  components found for: CRCL    Diabetes Interventions:  (Status:  Goal on track:  Yes.) Long Term Goal Assessed patient's understanding of A1c goal:  <7.5% Provided  education to patient about basic DM disease process Reviewed medications with patient and discussed importance of medication adherence Counseled on importance of regular laboratory monitoring as prescribed Discussed plans with patient for ongoing care management follow up and provided patient with direct contact information for care management team Provided patient with written educational materials related to hypo and hyperglycemia and importance of correct treatment Advised patient, providing education and rationale, to check cbg 3-4 times daily and record, calling provider for findings outside established parameters Reinforced to try to eat snacks with protein and lower sodium. Reinforced the rule of 15 and to call endocrinologist if she has frequent lows Lab Results  Component Value Date   HGBA1C 7.3 (A) 01/31/2022   Hyperlipidemia Interventions:  (Status:  Condition stable.  Not addressed this visit.) Long Term Goal Medication review performed; medication list updated in electronic medical record.  Provider established cholesterol goals reviewed Counseled on importance of regular laboratory monitoring as prescribed Reviewed role and benefits of statin for ASCVD risk reduction Reviewed importance of limiting foods high in cholesterol  Hypertension Interventions:  (Status:  Goal on track:  Yes.) Long Term Goal Last practice recorded BP readings:  BP Readings from Last 3 Encounters:  03/18/22 128/62  03/08/22 140/78  02/21/22 126/72  Most recent eGFR/CrCl:  Lab Results  Component Value Date   EGFR 25 (L) 10/29/2021    No components found for: CRCL  Evaluation of current treatment plan related to hypertension self management and patient's adherence to plan as established by provider Provided education to patient re:  stroke prevention, s/s of heart attack and stroke Reviewed medications with patient and discussed importance of compliance Counseled on adverse effects of illicit drug and excessive alcohol use in patients with high blood pressure  Discussed plans with patient for ongoing care management follow up and provided patient with direct contact information for care management team Advised patient, providing education and rationale, to monitor blood pressure daily and record, calling PCP for findings outside established parameters Provided education on prescribed diet low sodium. Low CHO heart healthy Reinforced to try to increase activity as tolerated  Patient Goals/Self-Care Activities: Take all medications as prescribed Attend all scheduled provider appointments Call pharmacy for medication refills 3-7 days in advance of running out of medications Call provider office for new concerns or questions  call office if I gain more than 2 pounds in one day or 5 pounds in one week keep legs up while sitting track weight in diary weigh myself daily follow rescue plan if symptoms flare-up eat more whole grains, fruits and vegetables, lean meats and healthy fats keep appointment with eye doctor check blood sugar at prescribed times: three times daily and when you have symptoms of low or high blood sugar fill half of plate with vegetables manage portion size Try eating protein with snacks check pulse (heart) rate once a day make a plan to eat healthy keep all lab appointments take medicine as prescribed check blood pressure daily choose a place to take my blood pressure (home, clinic or office, retail store) keep a blood pressure log take blood pressure log to all doctor appointments call for medicine refill 2 or 3 days before it runs out take all medications exactly as prescribed call doctor with any symptoms you believe are related to your medicine  Follow Up Plan:  Telephone follow up  appointment with care management team member scheduled for:  06/28/22 The patient has been provided with contact information for the care management  team and has been advised to call with any health related questions or concerns.        Patient verbalizes understanding of instructions and care plan provided today and agrees to view in [place]. Active MyChart status and patient understanding of how to access instructions and care plan via MyChart confirmed with patient.    The pharmacy team will reach out to the patient again over the next 60 days.   Viona Gilmore, [facility]

## 2022-06-09 ENCOUNTER — Other Ambulatory Visit: Payer: Self-pay | Admitting: Family Medicine

## 2022-06-09 ENCOUNTER — Other Ambulatory Visit: Payer: Self-pay | Admitting: Cardiovascular Disease

## 2022-06-09 DIAGNOSIS — K219 Gastro-esophageal reflux disease without esophagitis: Secondary | ICD-10-CM

## 2022-06-13 ENCOUNTER — Telehealth: Payer: Self-pay | Admitting: Pharmacist

## 2022-06-13 ENCOUNTER — Ambulatory Visit: Payer: Medicare Other | Attending: Cardiovascular Disease | Admitting: *Deleted

## 2022-06-13 DIAGNOSIS — Z7901 Long term (current) use of anticoagulants: Secondary | ICD-10-CM

## 2022-06-13 DIAGNOSIS — I4891 Unspecified atrial fibrillation: Secondary | ICD-10-CM | POA: Diagnosis not present

## 2022-06-13 DIAGNOSIS — I4819 Other persistent atrial fibrillation: Secondary | ICD-10-CM | POA: Diagnosis not present

## 2022-06-13 LAB — POCT INR: INR: 1.9 — AB (ref 2.0–3.0)

## 2022-06-13 NOTE — Patient Instructions (Signed)
Description   Today take 1 tablet then continue taking warfarin 1/2 tablet daily. Repeat INR in 4 weeks; Anticoagulation Clinic [phone]

## 2022-06-13 NOTE — Chronic Care Management (AMB) (Unsigned)
Chronic Care Management Pharmacy Assistant   Name: Regina [name]  MRN: [id number] DOB: May 18, 1941  Reason for Encounter: Medication Review / Medication Coordination Call  Recent office visits:  None  Recent consult visits:  None  Hospital visits:  None  Medications: Outpatient Encounter Medications as of 06/13/2022  Medication Sig   acetaminophen (TYLENOL) 500 MG tablet Take 1,000 mg by mouth every 6 (six) hours as needed for mild pain.   Alcohol Swabs (B-D SINGLE USE SWABS REGULAR) PADS Use to test blood sugar up to 3 times daily   allopurinol (ZYLOPRIM) 100 MG tablet TAKE ONE TABLET BY MOUTH EVERY MORNING   aspirin EC 81 MG EC tablet Take 1 tablet (81 mg total) by mouth daily. Swallow whole.   atorvastatin (LIPITOR) 20 MG tablet TAKE ONE TABLET BY MOUTH EVERYDAY AT BEDTIME   Blood Glucose Monitoring Suppl (ACCU-CHEK GUIDE) w/Device KIT 1 Device by Does not apply route 3 (three) times daily.   Capsaicin 0.033 % CREA Apply 1 application topically 3 (three) times daily. (Patient taking differently: Apply 1 application. topically 3 (three) times daily. As needed)   Cholecalciferol (VITAMIN D3) 50 MCG (2000 UT) capsule Take 2,000 Units by mouth daily.   Continuous Blood Gluc Sensor (DEXCOM G6 SENSOR) MISC 1 Device by Does not apply route as directed.   Continuous Blood Gluc Transmit (DEXCOM G6 TRANSMITTER) MISC 1 Device by Does not apply route as directed.   famotidine (PEPCID) 20 MG tablet Take 1 tablet (20 mg total) by mouth at bedtime.   FARXIGA 10 MG TABS tablet TAKE ONE TABLET BY MOUTH ONCE DAILY   Fluocinolone Acetonide 0.01 % OIL instill one drop in ears every 2-3 DAYS AS NEEDED FOR pruritis   gabapentin (NEURONTIN) 600 MG tablet TAKE ONE TABLET BY MOUTH EVERY MORNING and TAKE ONE TABLET BY MOUTH EVERYDAY AT BEDTIME   glucose blood (ACCU-CHEK GUIDE) test strip Use to test blood sugar up to 3 times daily   insulin glargine, 1 Unit Dial, (TOUJEO SOLOSTAR) 300 UNIT/ML  Solostar Pen Inject 24 Units into the skin daily. Eat a snack with protein nightly before bedtime.   insulin lispro (HUMALOG KWIKPEN) 100 UNIT/ML KwikPen Max daily 30 units   Insulin Pen Needle 32G X 4 MM MISC 1 Device by Does not apply route in the morning, at noon, in the evening, and at bedtime.   ipratropium (ATROVENT) 0.06 % nasal spray Place 2 sprays into both nostrils 4 (four) times daily. (Patient taking differently: Place 2 sprays into both nostrils 4 (four) times daily. As needed)   ketoconazole (NIZORAL) 2 % shampoo Apply 1 application. topically 2 (two) times a week.   Lancets Misc. (ACCU-CHEK FASTCLIX LANCET) KIT Use to test blood sugar up to 3 times daily   levothyroxine (SYNTHROID) 150 MCG tablet TAKE ONE TABLET BY MOUTH BEFORE BREAKFAST   magnesium oxide (MAG-OX) 400 MG tablet TAKE ONE TABLET BY MOUTH EVERY MORNING   meclizine (ANTIVERT) 25 MG tablet Take 25 mg by mouth daily as needed for dizziness.   metoprolol succinate (TOPROL-XL) 25 MG 24 hr tablet TAKE ONE TABLET BY MOUTH ONCE DAILY with OR immedaitely following A meal   nitroGLYCERIN (NITROSTAT) 0.4 MG SL tablet Dissolve 1 tab under tongue as needed for chest pain. May repeat every 5 minutes x 2 doses. If no relief call 9-1-1.   pantoprazole (PROTONIX) 20 MG tablet TAKE ONE TABLET BY MOUTH ONCE DAILY   Polyethylene Glycol 3350 (MIRALAX PO) Take 1 Package by  mouth daily as needed (constipation).   potassium chloride SA (KLOR-CON M20) 20 MEQ tablet Take 1 tablet (20 mEq total) by mouth daily.   Propylene Glycol (SYSTANE BALANCE OP) Place 1 drop into both eyes daily as needed (for dry eyes).   SALINE MIST SPRAY NA Place 2 sprays into the nose at bedtime.   Tafamidis 61 MG CAPS Take 61 mg by mouth daily.   torsemide (DEMADEX) 20 MG tablet Take 2 tablets (40 mg total) by mouth daily.   trolamine salicylate (ASPERCREME) 10 % cream Apply 1 application. topically as needed for muscle pain.   warfarin (COUMADIN) 5 MG tablet TAKE 1/2  TO 1 TABLET BY MOUTH ONCE daily OR AS DIRECTED by THE coumadin clinic   No facility-administered encounter medications on file as of 06/13/2022.   Reviewed chart for medication changes ahead of medication coordination call.  No OVs, Consults, or hospital visits since last care coordination call/Pharmacist visit. (If appropriate, list visit date, provider name)  No medication changes indicated OR if recent visit, treatment plan here.  BP Readings from Last 3 Encounters:  03/18/22 128/62  03/08/22 140/78  02/21/22 126/72    Lab Results  Component Value Date   HGBA1C 7.3 (A) 01/31/2022     Patient obtains medications through Adherence Packaging  30 Days    Last adherence delivery included: Warfarin 5 mg tablet (easy top vials) use as directed Klor-Con 20 meq - 1 tablet at breakfast Protonix 20 mg - 1 tablet at breakfast Aspirin 81 mg - 1 tablet at breakfast Torsemide 20 mg 2 tablets at breakfast  Cetirizine 10 mg 1 tablet at breakfast (OTC med) Atorvastatin 20 mg 1 tablet at bedtime Farxiga 10 mg 1 tablet at breakfast Metoprolol succinate 25 mg tablets 1 tablet at breakfast Gabapentin 600 mg tablet  1 tablet at breakfast and 1 tablet at bedtime Levothyroxine 150 mcg 1 tablet before breakfast Magnesium oxide 400 mg 1 tablet at breakfast Allopurinol 100 mg 1 tablet at breakfast Humalog Kwikpen 100 un/ml - max daily 30 units Pen needles 32 g X 4 mm   Patient declined (meds) last month: Dexcom not due til 07/10/2022 Toujeo not due til 06/14/2022  Does patient need these medications with this order? Nitrostat 0.16m - 1 tablet every 5 min as needed   Fluocinolone Acetonide 0.01 % OIL Ketoconazole (NIZORAL) 2 % shampoo     Patient is due for next adherence delivery on: 06/22/2022   Called patient and reviewed medications and coordinated delivery.   This delivery to include: Warfarin 5 mg tablet (easy top vials) use as directed Klor-Con 20 meq - 1 tablet at  breakfast Protonix 20 mg - 1 tablet at breakfast Aspirin 81 mg - 1 tablet at breakfast Cetirizine 10 mg 1 tablet at breakfast (OTC med) Atorvastatin 20 mg 1 tablet at bedtime Farxiga 10 mg 1 tablet at breakfast Metoprolol succinate 25 mg 1 tablet at breakfast Gabapentin 600 mg tablet  1 tablet at breakfast and 1 tablet at bedtime Levothyroxine 150 mcg 1 tablet before breakfast Magnesium oxide 400 mg 1 tablet at breakfast Allopurinol 100 mg 1 tablet at breakfast Humalog Kwikpen 100 un/ml - max daily 30 units Toujeo Solostart 300 units - inject 24 units daily Pen needles 32 g X 4 mm    Patient will need a short fill:    Coordinated acute fill:    Declined the following medications:  Dexcom not due until 07/10/2022 Torsemide not due until 08/14/2022  Unable to confirmed delivery  date of 06/22/2022 before CCM deadline date.  Care Gaps: AWV - scheduled 02/20/2023 Last BP - 128/62 on 03/18/2022 Last A1C - 7.3 on 01/31/2022 Covid booster - overdue Shingrix - overdue Flu - due Malb - due soon  Star Rating Drugs: Atorvastatin 65m - last filled 05/20/2022 30 DS at [facility] Farxiga 18m- last filled 05/20/2022 30 [phone]

## 2022-06-16 ENCOUNTER — Ambulatory Visit (INDEPENDENT_AMBULATORY_CARE_PROVIDER_SITE_OTHER): Payer: Medicare Other | Admitting: Family Medicine

## 2022-06-16 ENCOUNTER — Encounter: Payer: Self-pay | Admitting: Family Medicine

## 2022-06-16 VITALS — BP 122/76 | HR 81 | Temp 98.9°F | Wt 225.2 lb

## 2022-06-16 DIAGNOSIS — L819 Disorder of pigmentation, unspecified: Secondary | ICD-10-CM | POA: Diagnosis not present

## 2022-06-16 DIAGNOSIS — E1140 Type 2 diabetes mellitus with diabetic neuropathy, unspecified: Secondary | ICD-10-CM | POA: Diagnosis not present

## 2022-06-16 DIAGNOSIS — I4891 Unspecified atrial fibrillation: Secondary | ICD-10-CM

## 2022-06-16 DIAGNOSIS — I2510 Atherosclerotic heart disease of native coronary artery without angina pectoris: Secondary | ICD-10-CM

## 2022-06-16 DIAGNOSIS — Z794 Long term (current) use of insulin: Secondary | ICD-10-CM

## 2022-06-16 DIAGNOSIS — L98499 Non-pressure chronic ulcer of skin of other sites with unspecified severity: Secondary | ICD-10-CM | POA: Diagnosis not present

## 2022-06-16 DIAGNOSIS — E8582 Wild-type transthyretin-related (ATTR) amyloidosis: Secondary | ICD-10-CM | POA: Diagnosis not present

## 2022-06-16 DIAGNOSIS — I509 Heart failure, unspecified: Secondary | ICD-10-CM | POA: Diagnosis not present

## 2022-06-16 DIAGNOSIS — I110 Hypertensive heart disease with heart failure: Secondary | ICD-10-CM

## 2022-06-16 DIAGNOSIS — E1159 Type 2 diabetes mellitus with other circulatory complications: Secondary | ICD-10-CM | POA: Diagnosis not present

## 2022-06-16 DIAGNOSIS — E785 Hyperlipidemia, unspecified: Secondary | ICD-10-CM | POA: Diagnosis not present

## 2022-06-16 NOTE — Progress Notes (Signed)
Subjective:    Patient ID: Regina [name], female    DOB: 1941/03/15, 81 y.o.   MRN: [id number]  Chief Complaint  Patient presents with   Blister    Sores on the 2 fingers on left hand.started a month ago start out as blisters, then gets hard and splits. Used antibacterial cream 1 time a few days ago, did not help. Tried to keep hands clean by washing hands, pats them dry.   Pt accompanied by her son.  HPI Patient is a 81 yo female with pmh sig for DM II with polyneuropathy, CKD IV, CAD, TIAs, combined CHF, cardiac amyloidosis, HTN, hypothyroidism, afib on coumadin, was seen today for ongoing concern.  Pt with 2 sores on tips of L 2nd and 3 rd digits.  Started 4 wks ago as "black blisters" that popped, then became ulcers surrounded by blk skin.  Pt endorses similar lesions on same fingers last yr.  Pt denies pain, drainage, erythema, increased warmth.  Pt has neuropathy in hands due to DM.    States bs is good in am, 90-1teens.  May go up to 300s, depending on what she eats.  On sliding scale insulin as was getting low overnight.  Followed by Endo.  Endorses having a bad experience in the past at [facility].  Using a continuous glucometer to monitor bs.  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 01/2015 Cardiolite: + inf wall ischemia, EF 56%;  b. 01/2015 Cath: LM nl, LAD 71m LCX min irregs, RCA dominant, 50-612m  Sleep apnea    Swallowing difficulty     Allergies  Allergen Reactions   Ace Inhibitors Other (See Comments)    unknown   Amlodipine Other (See Comments)    unknown   Atenolol Other (See Comments)     bradycardia   Avandia [Rosiglitazone] Other (See Comments)    unknown   Darvon [Propoxyphene] Other (See Comments)    unknown   Erythromycin Itching   Hydralazine Other (See Comments)    Burning in throat and chest   Hydrocodone Other (See Comments)    Hallucinations.   Levofloxacin Itching   Morphine And Related Other (See Comments)    Dizzy and hallucianation, vomiting; Willing to try low dose   Percocet [Oxycodone-Acetaminophen] Other (See Comments)    hallucination   Spironolactone Other (See Comments)    unknown   Tramadol Other (See Comments)    Unknown/does not recall reaction but does not want to take again    ROS General: Denies fever, chills, night sweats, changes in weight, changes in appetite HEENT: Denies headaches, ear pain, changes in vision, rhinorrhea, sore throat CV: Denies CP, palpitations, SOB, orthopnea Pulm: Denies SOB, cough, wheezing GI: Denies abdominal pain, nausea, vomiting, diarrhea, constipation GU: Denies dysuria, hematuria, frequency, vaginal discharge Msk: Denies muscle cramps, joint pains Neuro: Denies weakness, numbness, tingling  +numbness in extremities Skin: Denies rashes, bruising +ulcers on fingertips of L hand Psych: Denies depression, anxiety, hallucinations     Objective:    Blood pressure 122/76, pulse  81, temperature 98.9 F (37.2 C), temperature source Oral, weight 225 lb 3.2 oz (102.2 kg), SpO2 95 %.  Gen. Pleasant, well-nourished, in no distress, normal affect   HEENT: [place]/AT, face symmetric, conjunctiva clear, no scleral icterus, PERRLA, EOMI, nares patent without drainage, pharynx without erythema or exudate. Neck: No JVD, no thyromegaly, no carotid bruits Lungs: no accessory muscle use, CTAB, no wheezes or rales Cardiovascular: RRR, no m/r/g, no peripheral edema Neuro:  A&Ox3, CN II-XII intact, normal gait Skin:  Warm,dry,  fingertips of L 2nd and 3rd digits with dried ulcers with hyperpigmented skin surrounding,  granulation tissue present in 3rd digit ulcer.  No TTP of fingers, drainage, increased warmth, or popping of skin.  R medial thumb with a smaller pin-point like opening in skin.  Unable to fully assess cap refill 2/2 nail polish.           Wt Readings from Last 3 Encounters:  06/16/22 225 lb 3.2 oz (102.2 kg)  03/18/22 223 lb 6 oz (101.3 kg)  03/08/22 224 lb 12.8 oz (102 kg)    Lab Results  Component Value Date   WBC 6.8 01/04/2022   HGB 12.0 05/17/2022   HCT 41.0 01/04/2022   PLT 290 01/04/2022   GLUCOSE 161 (H) 01/12/2022   CHOL 115 08/13/2021   TRIG 101.0 08/13/2021   HDL 54.80 08/13/2021   LDLCALC 40 08/13/2021   ALT 18 04/25/2020   AST 24 04/25/2020   NA 138 05/17/2022   K 4.1 05/17/2022   CL 100 05/17/2022   CREATININE 2.2 (A) 05/17/2022   BUN 46 (A) 05/17/2022   CO2 31 (A) 05/17/2022   TSH 0.65 06/09/2021   INR 1.9 (A) 06/13/2022   HGBA1C 7.3 (A) 01/31/2022   MICROALBUR <0.7 08/13/2021    Assessment/Plan:  Ulcer of finger, unspecified ulcer stage ([place]) -new issue  - Plan: Ambulatory referral to Hand Surgery, CBC with Differential/Platelet  Type 2 diabetes mellitus with diabetic neuropathy, with long-term current use of insulin (HCC)  -Hgb A1C 7.35 on 01/31/22 -continue current meds -continue f/u with Endo - Plan: Ambulatory referral to Hand Surgery  Coronary artery disease involving native coronary artery of native heart without angina pectoris  Wild-type transthyretin-related (ATTR) amyloidosis ([place])  Hyperpigmented skin lesion - Plan: RPR  Atrial fibrillation, unspecified type ([place]) -anticoagulated with coumadin -INR 1.9 on 06/13/22.  Goal 2.0-3.0  Concern for gangrene given pt's vascular hx, DM II, CAD, neuropathy, afib-on coumadin with recent INR 1.9  not at goal.  Pt denies pain, though may not be able to feel discomfort 2/2 polyneuropathy.  Discussed possible causes of hyperpigmentation on palm of R hand with pt.  Will obtain RPR.  Discussed  the importance of glycemic control.  Referral to hand surgery placed.  Given strict precautions.  F/u prn  Grier Mitts, MD

## 2022-06-28 ENCOUNTER — Ambulatory Visit (INDEPENDENT_AMBULATORY_CARE_PROVIDER_SITE_OTHER): Payer: Medicare Other | Admitting: *Deleted

## 2022-06-28 DIAGNOSIS — E1140 Type 2 diabetes mellitus with diabetic neuropathy, unspecified: Secondary | ICD-10-CM

## 2022-06-28 DIAGNOSIS — I10 Essential (primary) hypertension: Secondary | ICD-10-CM

## 2022-06-28 DIAGNOSIS — I509 Heart failure, unspecified: Secondary | ICD-10-CM

## 2022-06-28 NOTE — Patient Instructions (Signed)
Visit Information  Thank you for taking time to visit with me today. Please don't hesitate to contact me if I can be of assistance to you before our next scheduled telephone appointment.  Following are the goals we discussed today:  Take all medications as prescribed Attend all scheduled provider appointments Call pharmacy for medication refills 3-7 days in advance of running out of medications Call provider office for new concerns or questions  call office if I gain more than 2 pounds in one day or 5 pounds in one week keep legs up while sitting track weight in diary use salt in moderation watch for swelling in feet, ankles and legs every day weigh myself daily follow rescue plan if symptoms flare-up eat more whole grains, fruits and vegetables, lean meats and healthy fats check blood sugar at prescribed times: three times daily and when you have symptoms of low or high blood sugar fill half of plate with vegetables manage portion size wash and dry feet carefully every day Try eating protein with snacks check pulse (heart) rate once a day make a plan to eat healthy keep all lab appointments take medicine as prescribed check blood pressure daily choose a place to take my blood pressure (home, clinic or office, retail store) keep a blood pressure log take blood pressure log to all doctor appointments call for medicine refill 2 or 3 days before it runs out take all medications exactly as prescribed call doctor with any symptoms you believe are related to your medicine Get outside daily (weather permitting) and try to walk some  Our next appointment is by telephone on 08/09/22 at 10 am  Please call the care guide team at [phone] if you need to cancel or reschedule your appointment.   If you are experiencing a Mental Health or [place] or need someone to talk to, please call the Suicide and Crisis Lifeline: 988 call the [place] National Suicide Prevention  Lifeline: 5872759978 or TTY: 587-285-1934 TTY 9808870619) to talk to a trained counselor call 1-800-273-TALK (toll free, 24 hour hotline) go to [facility] [address] [place] [phone]) call 911   Patient verbalizes understanding of instructions and care plan provided today and agrees to view in [place]. Active MyChart status and patient understanding of how to access instructions and care plan via MyChart confirmed with patient.     Jacqlyn Larsen RNC, BSN RN Case Manager [facility] [phone]

## 2022-06-28 NOTE — Chronic Care Management (AMB) (Signed)
Chronic Care Management   CCM RN Visit Note  06/28/2022 Name: Regina [name] MRN: [id number] DOB: April 25, 1941  Subjective: Regina [name] is a 81 y.o. year old female who is a primary care patient of [name], Malka So, MD. The care management team was consulted for assistance with disease management and care coordination needs.    Engaged with patient by telephone for follow up visit in response to provider referral for case management and/or care coordination services.   Consent to Services:  The patient was given information about Chronic Care Management services, agreed to services, and gave verbal consent prior to initiation of services.  Please see initial visit note for detailed documentation.   Patient agreed to services and verbal consent obtained.   Assessment: Review of patient past medical history, allergies, medications, health status, including review of consultants reports, laboratory and other test data, was performed as part of comprehensive evaluation and provision of chronic care management services.   SDOH (Social Determinants of Health) assessments and interventions performed:  SDOH Interventions    Flowsheet Row Office Visit from 02/16/2022 in [facility] from 12/30/2021 in [facility] from 04/13/2021 in [place] at [facility] Interventions Intervention Not Indicated Intervention Not Indicated --  Housing Interventions Intervention Not Indicated Intervention Not Indicated --  Transportation Interventions Intervention Not Indicated Intervention Not Indicated Intervention Not Indicated  Financial Strain Interventions Intervention Not Indicated Intervention Not Indicated Intervention Not Indicated  Physical Activity Interventions Intervention Not Indicated -- --  Stress Interventions Intervention Not Indicated Intervention Not  Indicated --  Social Connections Interventions Intervention Not Indicated -- --        CCM Care Plan  Allergies  Allergen Reactions   Ace Inhibitors Other (See Comments)    unknown   Amlodipine Other (See Comments)    unknown   Atenolol Other (See Comments)    bradycardia   Avandia [Rosiglitazone] Other (See Comments)    unknown   Darvon [Propoxyphene] Other (See Comments)    unknown   Erythromycin Itching   Hydralazine Other (See Comments)    Burning in throat and chest   Hydrocodone Other (See Comments)    Hallucinations.   Levofloxacin Itching   Morphine And Related Other (See Comments)    Dizzy and hallucianation, vomiting; Willing to try low dose   Percocet [Oxycodone-Acetaminophen] Other (See Comments)    hallucination   Spironolactone Other (See Comments)    unknown   Tramadol Other (See Comments)    Unknown/does not recall reaction but does not want to take again    Outpatient Encounter Medications as of 06/28/2022  Medication Sig   acetaminophen (TYLENOL) 500 MG tablet Take 1,000 mg by mouth every 6 (six) hours as needed for mild pain.   Alcohol Swabs (B-D SINGLE USE SWABS REGULAR) PADS Use to test blood sugar up to 3 times daily   allopurinol (ZYLOPRIM) 100 MG tablet TAKE ONE TABLET BY MOUTH EVERY MORNING   aspirin EC 81 MG EC tablet Take 1 tablet (81 mg total) by mouth daily. Swallow whole.   atorvastatin (LIPITOR) 20 MG tablet TAKE ONE TABLET BY MOUTH EVERYDAY AT BEDTIME   Blood Glucose Monitoring Suppl (ACCU-CHEK GUIDE) w/Device KIT 1 Device by Does not apply route 3 (three) times daily.   Capsaicin 0.033 % CREA Apply 1 application topically 3 (three) times daily. (Patient taking differently: Apply 1 application  topically 3 (  three) times daily. As needed)   Cholecalciferol (VITAMIN D3) 50 MCG (2000 UT) capsule Take 2,000 Units by mouth daily.   Continuous Blood Gluc Sensor (DEXCOM G6 SENSOR) MISC 1 Device by Does not apply route as directed.   Continuous  Blood Gluc Transmit (DEXCOM G6 TRANSMITTER) MISC 1 Device by Does not apply route as directed.   famotidine (PEPCID) 20 MG tablet Take 1 tablet (20 mg total) by mouth at bedtime.   FARXIGA 10 MG TABS tablet TAKE ONE TABLET BY MOUTH ONCE DAILY   Fluocinolone Acetonide 0.01 % OIL instill one drop in ears every 2-3 DAYS AS NEEDED FOR pruritis   gabapentin (NEURONTIN) 600 MG tablet TAKE ONE TABLET BY MOUTH EVERY MORNING and TAKE ONE TABLET BY MOUTH EVERYDAY AT BEDTIME   glucose blood (ACCU-CHEK GUIDE) test strip Use to test blood sugar up to 3 times daily   insulin glargine, 1 Unit Dial, (TOUJEO SOLOSTAR) 300 UNIT/ML Solostar Pen Inject 24 Units into the skin daily. Eat a snack with protein nightly before bedtime.   insulin lispro (HUMALOG KWIKPEN) 100 UNIT/ML KwikPen Max daily 30 units   Insulin Pen Needle 32G X 4 MM MISC 1 Device by Does not apply route in the morning, at noon, in the evening, and at bedtime.   ipratropium (ATROVENT) 0.06 % nasal spray Place 2 sprays into both nostrils 4 (four) times daily. (Patient taking differently: Place 2 sprays into both nostrils 4 (four) times daily. As needed)   ketoconazole (NIZORAL) 2 % shampoo Apply 1 application. topically 2 (two) times a week.   Lancets Misc. (ACCU-CHEK FASTCLIX LANCET) KIT Use to test blood sugar up to 3 times daily   levothyroxine (SYNTHROID) 150 MCG tablet TAKE ONE TABLET BY MOUTH BEFORE BREAKFAST   magnesium oxide (MAG-OX) 400 MG tablet TAKE ONE TABLET BY MOUTH EVERY MORNING   meclizine (ANTIVERT) 25 MG tablet Take 25 mg by mouth daily as needed for dizziness.   metoprolol succinate (TOPROL-XL) 25 MG 24 hr tablet TAKE ONE TABLET BY MOUTH ONCE DAILY with OR immedaitely following A meal   nitroGLYCERIN (NITROSTAT) 0.4 MG SL tablet Dissolve 1 tab under tongue as needed for chest pain. May repeat every 5 minutes x 2 doses. If no relief call 9-1-1.   pantoprazole (PROTONIX) 20 MG tablet TAKE ONE TABLET BY MOUTH ONCE DAILY   Polyethylene  Glycol 3350 (MIRALAX PO) Take 1 Package by mouth daily as needed (constipation).   Propylene Glycol (SYSTANE BALANCE OP) Place 1 drop into both eyes daily as needed (for dry eyes).   SALINE MIST SPRAY NA Place 2 sprays into the nose at bedtime.   Tafamidis 61 MG CAPS Take 61 mg by mouth daily.   trolamine salicylate (ASPERCREME) 10 % cream Apply 1 application. topically as needed for muscle pain.   warfarin (COUMADIN) 5 MG tablet TAKE 1/2 TO 1 TABLET BY MOUTH ONCE daily OR AS DIRECTED by THE coumadin clinic   potassium chloride SA (KLOR-CON M20) 20 MEQ tablet Take 1 tablet (20 mEq total) by mouth daily.   torsemide (DEMADEX) 20 MG tablet Take 2 tablets (40 mg total) by mouth daily.   No facility-administered encounter medications on file as of 06/28/2022.    Patient Active Problem List   Diagnosis Date Noted   Pain due to onychomycosis of toenail of left foot 02/11/2022   Chest pain 12/26/2021   Left atrial thrombus 12/25/2021   Wild-type transthyretin-related (ATTR) amyloidosis (HCC)    Lung nodules 12/13/2021   Polymyalgia  rheumatica ([place]) 06/30/2021   Neck pain 06/18/2021   Gait abnormality 06/18/2021   Memory loss 06/18/2021   Atherosclerosis of aorta ([place]) 06/09/2021   Type 2 diabetes mellitus with stage 3a chronic kidney disease, with long-term current use of insulin ([place]) 09/14/2020   Type 2 diabetes mellitus with retinopathy, with long-term current use of insulin ([place]) 09/14/2020   Diabetes mellitus ([place]) 09/14/2020   Type 2 diabetes mellitus with diabetic polyneuropathy, with long-term current use of insulin ([place]) 09/14/2020   Long term (current) use of anticoagulants 07/13/2020   Persistent atrial fibrillation (HCC)    Mixed hyperlipidemia    Acute on chronic diastolic CHF (congestive heart failure), NYHA class 3 ([place])    Demand ischemia (HCC)    Atrial fibrillation ([place]) 04/24/2020   Other fatigue 03/24/2020   Short of breath on exertion 03/24/2020   Congestive heart  failure ([place]) 03/24/2020   Vitamin D deficiency 03/24/2020   Sleep apnea 05/21/2019   History of hepatitis C 05/21/2019   Insomnia 05/21/2019   CKD (chronic kidney disease) stage 4, GFR 15-29 ml/min (HCC) 05/21/2019   GERD (gastroesophageal reflux disease) 04/20/2018   Type 2 diabetes mellitus with diabetic neuropathy, unspecified ([place]) 12/19/2016   History of TIA (transient ischemic attack) 12/19/2016   Hyperlipidemia associated with type 2 diabetes mellitus ([place]) 12/19/2016   S/P total knee replacement using cement, right 03/18/2015   Non-obstructive CAD    Cardiovascular stress test abnormal    Pleuritic chest pain 01/16/2015   Acute renal failure superimposed on stage 3 chronic kidney disease ([place]) 01/16/2015   Obesity, Class III, BMI 40-49.9 (morbid obesity) ([place]) 01/16/2015   Essential hypertension 01/16/2015   Hypothyroidism 01/16/2015   OSA on CPAP 01/16/2015   DM type 2 (diabetes mellitus, type 2) ([place]) 01/16/2015   Coronary artery disease involving native coronary artery without angina pectoris 10/02/2014   Physical deconditioning 02/28/2013   Morbid obesity ([place]) 02/27/2013   Osteoarthritis 02/27/2013    Conditions to be addressed/monitored:Atrial Fibrillation, CHF, HTN, and DMII  Care Plan : RN Care Manager Plan of Care  Updates made by Kassie Mends, RN since 06/28/2022 12:00 AM     Problem: Chronic Disease Management and Care Coordination Needs (CHF,CAD,DM2 HTN,HLD, Atrial Fib, CKD4)   Priority: High     Long-Range Goal: Establish Plan of Care for Chronic Disease Management Needs (CHF,CAD,DM2 HTN,HLD, Atrial Fib, CKD4)   Start Date: 12/30/2021  Expected End Date: 12/30/2022  Priority: High  Note:   Current Barriers:  Knowledge Deficits related to plan of care for management of Atrial Fibrillation, CHF, CAD, HTN, HLD, DMII, and CKD Stage 4  Chronic Disease Management support and education needs related to Atrial Fibrillation, CHF, CAD, HTN, HLD, DMII, and CKD  Stage 4  States she is trying to do more and she is doing light housework.   States she is weighting every day and her weight was 222 pounds today.  Denies any swelling, chest pains or shortness of breath.  States her B/P reading have been good. State her blood sugars range from 94-132 in the morning and states " haven't been checking on it after eating".   Denies any hypoglycemia.  States she is taking 24 units of Toujeo in the evenings. States she is taking 5- 7 units  at meal time depending on her sugar and she does not take if her blood sugar is lower.  States she tries to follow a low sodium diabetes diet. States she is trying to follow her  fluid restriction but she does like ice cold water. States she is using her cane and denies any recent falls.    RNCM Clinical Goal(s):  Patient will verbalize understanding of plan for management of Atrial Fibrillation, CHF, CAD, HTN, HLD, DMII, and CKD Stage 4 as evidenced by voiced adherence to plan of care verbalize basic understanding of  Atrial Fibrillation, CHF, CAD, HTN, HLD, and DMII disease process and self health management plan as evidenced by voiced understanding and teach back take all medications exactly as prescribed and will call provider for medication related questions as evidenced by dispense report and pt verbalization  attend all scheduled medical appointments: 07/08/22 Dr. Audie Box, Coumadin clinic 07/08/22, Annual Wellness Visit 02/20/23,Endocrinology 08/01/22, Dr. [name] (primary care provider) 09/19/22 as evidenced by medical records demonstrate Improved adherence to prescribed treatment plan for Atrial Fibrillation, CHF, CAD, HTN, HLD, DMII, and CKD Stage 4 as evidenced by readings within limits, voiced adherence to plan of care continue to work with RN Care Manager to address care management and care coordination needs related to  Atrial Fibrillation, CHF, CAD, HTN, HLD, DMII, and CKD Stage 4 as evidenced by adherence to CM Team Scheduled  appointments through collaboration with RN Care manager, provider, and care team.   Interventions: 1:1 collaboration with primary care provider regarding development and update of comprehensive plan of care as evidenced by provider attestation and co-signature Inter-disciplinary care team collaboration (see longitudinal plan of care) Evaluation of current treatment plan related to  self management and patient's adherence to plan as established by provider   AFIB Interventions: (Status:  Goal on track:  Yes.) Long Term Goal   Counseled on increased risk of stroke due to Afib and benefits of anticoagulation for stroke prevention Reviewed importance of adherence to anticoagulant exactly as prescribed Counseled on bleeding risk associated with Coumadin and importance of self-monitoring for signs/symptoms of bleeding Counseled on importance of regular laboratory monitoring as prescribed Reviewed foods to limit while taking Coumadin. Reviewed home safety and fall precautions when taking Coumadin   CAD Interventions: (Status:  Goal on track:  Yes.) Long Term Goal Assessed understanding of CAD diagnosis Medications reviewed including medications utilized in CAD treatment plan Provided education on importance of blood pressure control in management of CAD Provided education on Importance of limiting foods high in cholesterol Reviewed Importance of taking all medications as prescribed Reviewed Importance of attending all scheduled provider appointments Reviewed signs and symptoms to call doctor and when to call 911   Heart Failure Interventions:  (Status:  Goal on track:  Yes.) Long Term Goal Provided education on low sodium diet Discussed importance of daily weight and advised patient to weigh and record daily Discussed the importance of keeping all appointments with provider  Reviewed importance of following fluid restrictions.  Reviwed to pace her activity.  Reviewed s/sx to call provider and  when to take extra diuretic  Reinforced Heart Failure action plan, importance of calling for weight gain 3 pounds overnight or 5 pounds in one week   Chronic Kidney Disease Interventions:  (Status:  Goal on track:  Yes.) Long Term Goal Assessed the Patient understanding of chronic kidney disease    Evaluation of current treatment plan related to chronic kidney disease self management and patient's adherence to plan as established by provider      Reviewed medications with patient and discussed importance of compliance    Advised patient, providing education and rationale, to monitor blood pressure daily and record, calling PCP for findings  outside established parameters    Discussed the impact of chronic kidney disease on daily life and mental health and acknowledged and normalized feelings of disempowerment, fear, and frustration    Last practice recorded BP readings:  BP Readings from Last 3 Encounters:  03/18/22 128/62  03/08/22 140/78  02/21/22 126/72  Most recent eGFR/CrCl:  Lab Results  Component Value Date   EGFR 25 (L) 10/29/2021    No components found for: CRCL    Diabetes Interventions:  (Status:  Goal on track:  Yes.) Long Term Goal Assessed patient's understanding of A1c goal:  <7.5% Provided education to patient about basic DM disease process Reviewed medications with patient and discussed importance of medication adherence Counseled on importance of regular laboratory monitoring as prescribed Discussed plans with patient for ongoing care management follow up and provided patient with direct contact information for care management team Advised patient, providing education and rationale, to check cbg 3-4 times daily and record, calling provider for findings outside established parameters Reviewed to try to eat snacks with protein and lower sodium, gave examples of healthy snacks Reinforced the rule of 15 and to call endocrinologist if she has frequent lows Reviewed  carbohydrate modified diet  Lab Results  Component Value Date   HGBA1C 7.3 (A) 01/31/2022   Hyperlipidemia Interventions:  (Status:  Condition stable.  Not addressed this visit.) Long Term Goal Medication review performed; medication list updated in electronic medical record.  Provider established cholesterol goals reviewed Counseled on importance of regular laboratory monitoring as prescribed Reviewed role and benefits of statin for ASCVD risk reduction Reviewed importance of limiting foods high in cholesterol Reviewed heart healthy diet  Hypertension Interventions:  (Status:  Goal on track:  Yes.) Long Term Goal Last practice recorded BP readings:  BP Readings from Last 3 Encounters:  03/18/22 128/62  03/08/22 140/78  02/21/22 126/72  Most recent eGFR/CrCl:  Lab Results  Component Value Date   EGFR 25 (L) 10/29/2021    No components found for: CRCL  Evaluation of current treatment plan related to hypertension self management and patient's adherence to plan as established by provider Reviewed medications with patient and discussed importance of compliance Discussed plans with patient for ongoing care management follow up and provided patient with direct contact information for care management team Advised patient, providing education and rationale, to monitor blood pressure daily and record, calling PCP for findings outside established parameters Provided education on prescribed diet low sodium. Low CHO heart healthy Reviewed trying to increase activity as tolerated  Patient Goals/Self-Care Activities: Take all medications as prescribed Attend all scheduled provider appointments Call pharmacy for medication refills 3-7 days in advance of running out of medications Call provider office for new concerns or questions  call office if I gain more than 2 pounds in one day or 5 pounds in one week keep legs up while sitting track weight in diary use salt in moderation watch for  swelling in feet, ankles and legs every day weigh myself daily follow rescue plan if symptoms flare-up eat more whole grains, fruits and vegetables, lean meats and healthy fats check blood sugar at prescribed times: three times daily and when you have symptoms of low or high blood sugar fill half of plate with vegetables manage portion size wash and dry feet carefully every day Try eating protein with snacks check pulse (heart) rate once a day make a plan to eat healthy keep all lab appointments take medicine as prescribed check blood pressure daily choose a  place to take my blood pressure (home, clinic or office, retail store) keep a blood pressure log take blood pressure log to all doctor appointments call for medicine refill 2 or 3 days before it runs out take all medications exactly as prescribed call doctor with any symptoms you believe are related to your medicine Get outside daily (weather permitting) and try to walk some  Follow Up Plan:  Telephone follow up appointment with care management team member scheduled for:  08/09/22 at 10 am The patient has been provided with contact information for the care management team and has been advised to call with any health related questions or concerns.       Plan:Telephone follow up appointment with care management team member scheduled for:  08/09/22 at 10 am  Jacqlyn Larsen [facility], BSN RN Case Manager [place] [phone]

## 2022-07-04 NOTE — Progress Notes (Signed)
Cardiology Office Note:   Date:  07/08/2022  NAME:  Regina [name]    MRN: [id number] DOB:  24-Oct-1940   PCP:  [name], [name], MD  Cardiologist:  Evalina Field, MD  Electrophysiologist:  None   Referring MD: [name], [name], MD   Chief Complaint  Patient presents with   Follow-up        History of Present Illness:   Regina [name] is a 81 y.o. female with below medical history who presents for follow-up.  She reports she is doing well.  Weights are stable.  Still experiencing bloating.  Her volume status is acceptable.  INR is within limits.  Blood pressure is stable.  She remains on tafamidis.  She actually was just approved for vutrisiran.  It would be good for her to start this gene silence the medication.  She has been fairly stable.  She is doing as much activity as she can.  Seems to be doing quite well.   Problem List 1.  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 Persistent Afib -LAA slugde vs thrombus 05/05/2020  -persistent sludge 07/01/2020 -LAA thrombus persistent 08/05/2020 -on warfarin 3. DM -A1c 7.3 4. HLD -T chol 115, HDL 54, LDL 40, triglycerides 101 5. HTN 6. Non-obstructive CAD -LHC 01/19/2015 [place] -50% mid RCA and 50% LAD -MPI 08/14/2020 -> normal 7. OSA 8. CKD 3/4 9. CVA -11/2020 @ [facility]  10. Polymyalgia Rheumatica   Past Medical History: Past Medical History:  Diagnosis Date   Back pain    CHF (congestive heart failure) (HCC)    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 01/2015 Cardiolite: + inf wall  ischemia, EF 56%;  b. 01/2015 Cath: LM nl, LAD 16m LCX min irregs, RCA dominant, 50-670m  Sleep apnea    Swallowing difficulty     Past Surgical History: Past Surgical History:  Procedure Laterality Date   ABDOMINAL HYSTERECTOMY     BUBBLE STUDY  07/01/2020   Procedure: BUBBLE STUDY;  Surgeon: AcElouise MunroeMD;  [facility]: Cardiovascular;;   BUBBLE STUDY  08/05/2020   Procedure: BUBBLE STUDY;  Surgeon: O'Geralynn RileMD;  [facility] Service: Cardiovascular;;  done with definity    CARDIAC CATHETERIZATION     ESOPHAGOGASTRODUODENOSCOPY     a. 01/2015 EGD: patent esophagus.   ESOPHAGOGASTRODUODENOSCOPY N/A 01/20/2015   Procedure: ESOPHAGOGASTRODUODENOSCOPY (EGD);  Surgeon: PaCarol AdaMD;  [facility];  Service: Endoscopy;  Laterality: N/A;   HAND SURGERY     JOINT REPLACEMENT     reports history bilateral TKA and right TSA   LEFT HEART CATHETERIZATION WITH CORONARY ANGIOGRAM N/A 01/19/2015   RIGHT HEART CATH N/A 12/27/2021   Procedure: RIGHT HEART CATH;  Surgeon: BeLorretta HarpMD;  [facility];  Service: Cardiovascular;  Laterality: N/A;   TEE WITHOUT CARDIOVERSION  05/04/2020   TEE WITHOUT CARDIOVERSION N/A 05/05/2020   Procedure: TRANSESOPHAGEAL ECHOCARDIOGRAM (TEE);  Surgeon: HiPixie Casino  MD;  Location: [place];  Service: Cardiovascular;  Laterality: N/A;   TEE WITHOUT CARDIOVERSION N/A 07/01/2020   Procedure: TRANSESOPHAGEAL ECHOCARDIOGRAM (TEE);  Surgeon: Elouise Munroe, MD;  Location: [place];  Service: Cardiovascular;  Laterality: N/A;   TEE WITHOUT CARDIOVERSION N/A 08/05/2020   Procedure: TRANSESOPHAGEAL ECHOCARDIOGRAM (TEE);  Surgeon: Geralynn Rile, MD;  Location: [place];  Service: Cardiovascular;  Laterality: N/A;   THYROIDECTOMY      Current Medications: Current Meds  Medication Sig   acetaminophen (TYLENOL) 500 MG tablet Take 1,000 mg by mouth every 6 (six)  hours as needed for mild pain.   Alcohol Swabs (B-D SINGLE USE SWABS REGULAR) PADS Use to test blood sugar up to 3 times daily   allopurinol (ZYLOPRIM) 100 MG tablet TAKE ONE TABLET BY MOUTH EVERY MORNING   aspirin EC 81 MG EC tablet Take 1 tablet (81 mg total) by mouth daily. Swallow whole.   atorvastatin (LIPITOR) 20 MG tablet TAKE ONE TABLET BY MOUTH EVERYDAY AT BEDTIME   Blood Glucose Monitoring Suppl (ACCU-CHEK GUIDE) w/Device KIT 1 Device by Does not apply route 3 (three) times daily.   Cholecalciferol (VITAMIN D3) 50 MCG (2000 UT) capsule Take 2,000 Units by mouth daily.   Continuous Blood Gluc Sensor (DEXCOM G6 SENSOR) MISC 1 Device by Does not apply route as directed.   Continuous Blood Gluc Transmit (DEXCOM G6 TRANSMITTER) MISC 1 Device by Does not apply route as directed.   famotidine (PEPCID) 20 MG tablet Take 1 tablet (20 mg total) by mouth at bedtime.   FARXIGA 10 MG TABS tablet TAKE ONE TABLET BY MOUTH ONCE DAILY   gabapentin (NEURONTIN) 600 MG tablet TAKE ONE TABLET BY MOUTH EVERY MORNING and TAKE ONE TABLET BY MOUTH EVERYDAY AT BEDTIME   glucose blood (ACCU-CHEK GUIDE) test strip Use to test blood sugar up to 3 times daily   insulin glargine, 1 Unit Dial, (TOUJEO SOLOSTAR) 300 UNIT/ML Solostar Pen Inject 24 Units into the skin daily. Eat a snack with protein nightly before bedtime.   insulin lispro (HUMALOG KWIKPEN) 100 UNIT/ML KwikPen Max daily 30 units   Insulin Pen Needle 32G X 4 MM MISC 1 Device by Does not apply route in the morning, at noon, in the evening, and at bedtime.   ipratropium (ATROVENT) 0.06 % nasal spray Place 2 sprays into both nostrils 4 (four) times daily. (Patient taking differently: Place 2 sprays into both nostrils 4 (four) times daily. As needed)   ketoconazole (NIZORAL) 2 % shampoo Apply 1 application. topically 2 (two) times a week.   Lancets Misc. (ACCU-CHEK FASTCLIX LANCET) KIT Use to test blood sugar up to 3 times daily   levothyroxine (SYNTHROID) 150  MCG tablet TAKE ONE TABLET BY MOUTH BEFORE BREAKFAST   magnesium oxide (MAG-OX) 400 MG tablet TAKE ONE TABLET BY MOUTH EVERY MORNING   meclizine (ANTIVERT) 25 MG tablet Take 25 mg by mouth daily as needed for dizziness.   metoprolol succinate (TOPROL-XL) 25 MG 24 hr tablet TAKE ONE TABLET BY MOUTH ONCE DAILY with OR immedaitely following A meal   nitroGLYCERIN (NITROSTAT) 0.4 MG SL tablet Dissolve 1 tab under tongue as needed for chest pain. May repeat every 5 minutes x 2 doses. If no relief call 9-1-1.   pantoprazole (PROTONIX) 20 MG tablet TAKE ONE TABLET BY MOUTH ONCE DAILY   Polyethylene Glycol 3350 (MIRALAX PO) Take 1 Package by mouth daily as needed (constipation).   potassium chloride SA (KLOR-CON M20) 20 MEQ tablet Take 1  tablet (20 mEq total) by mouth daily.   Propylene Glycol (SYSTANE BALANCE OP) Place 1 drop into both eyes daily as needed (for dry eyes).   SALINE MIST SPRAY NA Place 2 sprays into the nose at bedtime.   Tafamidis 61 MG CAPS Take 61 mg by mouth daily.   torsemide (DEMADEX) 20 MG tablet Take 2 tablets (40 mg total) by mouth daily.   trolamine salicylate (ASPERCREME) 10 % cream Apply 1 application. topically as needed for muscle pain.   warfarin (COUMADIN) 5 MG tablet TAKE 1/2 TO 1 TABLET BY MOUTH ONCE daily OR AS DIRECTED by THE coumadin clinic     Allergies:    Ace inhibitors, Amlodipine, Atenolol, Avandia [rosiglitazone], Darvon [propoxyphene], Erythromycin, Hydralazine, Hydrocodone, Levofloxacin, Morphine and related, Percocet [oxycodone-acetaminophen], Spironolactone, and Tramadol   Social History: Social History   Socioeconomic History   Marital status: Widowed    Spouse name: Not on file   Number of children: Not on file   Years of education: Not on file   Highest education level: Not on file  Occupational History   Occupation: Retired  Tobacco Use   Smoking status: Never   Smokeless tobacco: Never  Vaping Use   Vaping Use: Never used  Substance and  Sexual Activity   Alcohol use: No   Drug use: Never   Sexual activity: Not Currently  Other Topics Concern   Not on file  Social History Narrative   Lives with daughter   Social Determinants of Health   Financial Resource Strain: [place]  (02/16/2022)   Overall Financial Resource Strain (CARDIA)    Difficulty of Paying Living Expenses: Not hard at all  Food Insecurity: No [place] (02/16/2022)   Hunger Vital Sign    Worried About Running Out of Food in the Last Year: Never true    [place] in the Last Year: Never true  Transportation Needs: No Transportation Needs (02/16/2022)   PRAPARE - Hydrologist (Medical): No    Lack of Transportation (Non-Medical): No  Physical Activity: Inactive (02/16/2022)   Exercise Vital Sign    Days of Exercise per Week: 0 days    Minutes of Exercise per Session: 0 min  Stress: No Stress Concern Present (02/16/2022)   [place]    Feeling of Stress : Not at all  Social Connections: Moderately Integrated (02/16/2022)   Social Connection and Isolation Panel [NHANES]    Frequency of Communication with Friends and Family: More than three times a week    Frequency of Social Gatherings with Friends and Family: More than three times a week    Attends Religious Services: More than 4 times per year    Active Member of [employer] or Organizations: Yes    Attends Archivist Meetings: More than 4 times per year    Marital Status: Widowed     Family History: The patient's family history includes Alcoholism in her father; Cancer in her father; Heart disease in her mother; Hypertension in her mother.  ROS:   All other ROS reviewed and negative. Pertinent positives noted in the HPI.     EKGs/Labs/Other Studies Reviewed:   The following studies were personally reviewed by me today:  TTE 12/24/2021  1. Left ventricular ejection fraction, by estimation, is  40 to 45%. The  left ventricle has mildly decreased function. The left ventricle  demonstrates global hypokinesis. There is mild concentric left ventricular  hypertrophy. Left ventricular diastolic  parameters are indeterminate.   2. Right ventricular systolic function is low normal. The right  ventricular size is normal. Mildly increased right ventricular wall  thickness. There is normal pulmonary artery systolic pressure.   3. Left atrial size was mildly dilated.   4. The mitral valve is normal in structure. Mild mitral valve  regurgitation. No evidence of mitral stenosis.   5. The aortic valve was not well visualized. Aortic valve regurgitation  is not visualized. No aortic stenosis is present.   Recent Labs: 12/28/2021: Magnesium 2.4 01/04/2022: Platelets 290 01/12/2022: B Natriuretic Peptide 420.4 05/17/2022: BUN 46; Creatinine 2.2; Hemoglobin 12.0; Potassium 4.1; Sodium 138   Recent Lipid Panel    Component Value Date/Time   CHOL 115 08/13/2021 1501   CHOL 117 03/24/2020 1428   TRIG 101.0 08/13/2021 1501   HDL 54.80 08/13/2021 1501   HDL 56 03/24/2020 1428   CHOLHDL 2 08/13/2021 1501   VLDL 20.2 08/13/2021 1501   LDLCALC 40 08/13/2021 1501   LDLCALC 49 03/24/2020 1428    Physical Exam:   VS:  BP 123/77   Pulse 91   Ht 5' 2" (1.575 m)   Wt 226 lb 3.2 oz (102.6 kg)   SpO2 100%   BMI 41.37 kg/m    Wt Readings from Last 3 Encounters:  07/08/22 226 lb 3.2 oz (102.6 kg)  06/16/22 225 lb 3.2 oz (102.2 kg)  03/18/22 223 lb 6 oz (101.3 kg)    General: Well nourished, well developed, in no acute distress Head: Atraumatic, normal size  Eyes: PEERLA, EOMI  Neck: Supple, no JVD Endocrine: No thryomegaly Cardiac: Normal S1, S2; RRR; no murmurs, rubs, or gallops Lungs: Clear to auscultation bilaterally, no wheezing, rhonchi or rales  Abd: Soft, nontender, no hepatomegaly  Ext: No edema, pulses 2+ Musculoskeletal: No deformities, BUE and BLE strength normal and equal Skin:  Warm and dry, no rashes   Neuro: Alert and oriented to person, place, time, and situation, CNII-XII grossly intact, no focal deficits  Psych: Normal mood and affect   ASSESSMENT:   Regina [name] is a 81 y.o. female who presents for the following: 1. Persistent atrial fibrillation ([place])   2. Long term (current) use of anticoagulants   3. Hereditary cardiac amyloidosis ([place])   4. Chronic systolic congestive heart failure ([place])   5. Coronary artery disease involving native coronary artery of native heart with other form of angina pectoris ([place])   6. Hyperlipidemia associated with type 2 diabetes mellitus ([place])     PLAN:   1. Persistent atrial fibrillation ([place]) 2. Long term (current) use of anticoagulants -Long history of atrial fibrillation.  Unsuccessful attempt at cardioversion due to persistent left atrial appendage thrombus on Eliquis and Coumadin.  We have opted just to continue Coumadin therapy.  I believe this is the best option for her.  She is done well.  No further issues.  She is tolerating metoprolol well.  Ideally we would get her back in rhythm and put her on amiodarone but this is likely not going to be successful given 3 attempts at cardioversion and persistent left atrial appendage thrombus on all attempts.  3. Hereditary cardiac amyloidosis ([place]) 4. Chronic systolic congestive heart failure (HCC) -Long history of hereditary cardiac amyloidosis.  On Vyndamax.  Doing well on torsemide 40 mg daily.  Her weights are acceptable.  She has done well.  No signs of hypotension.  Her ejection fraction was low but has recovered.  I suspect some of her ejection fraction dropped was in the setting of anesthesia.  She seems to have recovered her ejection fraction without GDMT.  She has CKD stage IV which we are monitoring closely.  She also has recently been approved for Amvuttra.  This is a gene silence or in the setting of hereditary cardiac amyloidosis.  She has significant GI symptoms  such as bloating and this will help.  5. Coronary artery disease involving native coronary artery of native heart with other form of angina pectoris ([place]) 6. Hyperlipidemia associated with type 2 diabetes mellitus ([place]) -History of nonobstructive CAD.  Continue aspirin statin.  LDL at goal.      Disposition: Return in about 6 months (around 01/06/2023).  Medication Adjustments/Labs and Tests Ordered: Current medicines are reviewed at length with the patient today.  Concerns regarding medicines are outlined above.  No orders of the defined types were placed in this encounter.  No orders of the defined types were placed in this encounter.   Patient Instructions  Medication Instructions:  The current medical regimen is effective;  continue present plan and medications.  *If you need a refill on your cardiac medications before your next appointment, please call your pharmacy*   Follow-Up: At [facility], you and your health needs are our priority.  As part of our continuing mission to provide you with exceptional heart care, we have created designated Provider Care Teams.  These Care Teams include your primary Cardiologist (physician) and Advanced Practice Providers (APPs -  Physician Assistants and Nurse Practitioners) who all work together to provide you with the care you need, when you need it.  We recommend signing up for the patient portal called "MyChart".  Sign up information is provided on this After Visit Summary.  MyChart is used to connect with patients for Virtual Visits (Telemedicine).  Patients are able to view lab/test results, encounter notes, upcoming appointments, etc.  Non-urgent messages can be sent to your provider as well.   To learn more about what you can do with MyChart, go to [email or web address].    Your next appointment:   6 month(s)  The format for your next appointment:   In Person  Provider:   Evalina Field, MD            Time  Spent with Patient: I have spent a total of 35 minutes with patient reviewing hospital notes, telemetry, EKGs, labs and examining the patient as well as establishing an assessment and plan that was discussed with the patient.  > 50% of time was spent in direct patient care.  Signed, Addison Naegeli. Audie Box, MD, [place]  [address] [place], [place] [address] [phone]  07/08/2022 5:09 PM

## 2022-07-07 ENCOUNTER — Encounter: Payer: Self-pay | Admitting: Cardiovascular Disease

## 2022-07-08 ENCOUNTER — Encounter: Payer: Self-pay | Admitting: Cardiovascular Disease

## 2022-07-08 ENCOUNTER — Ambulatory Visit: Payer: Medicare Other | Admitting: *Deleted

## 2022-07-08 ENCOUNTER — Telehealth: Payer: Self-pay | Admitting: Pharmacist

## 2022-07-08 ENCOUNTER — Ambulatory Visit: Payer: Medicare Other | Attending: Cardiovascular Disease | Admitting: Cardiovascular Disease

## 2022-07-08 VITALS — BP 123/77 | HR 91 | Ht 62.0 in | Wt 226.2 lb

## 2022-07-08 DIAGNOSIS — Z7901 Long term (current) use of anticoagulants: Secondary | ICD-10-CM | POA: Diagnosis not present

## 2022-07-08 DIAGNOSIS — I43 Cardiomyopathy in diseases classified elsewhere: Secondary | ICD-10-CM | POA: Diagnosis not present

## 2022-07-08 DIAGNOSIS — I25118 Atherosclerotic heart disease of native coronary artery with other forms of angina pectoris: Secondary | ICD-10-CM

## 2022-07-08 DIAGNOSIS — I4819 Other persistent atrial fibrillation: Secondary | ICD-10-CM

## 2022-07-08 DIAGNOSIS — I5022 Chronic systolic (congestive) heart failure: Secondary | ICD-10-CM

## 2022-07-08 DIAGNOSIS — E1169 Type 2 diabetes mellitus with other specified complication: Secondary | ICD-10-CM

## 2022-07-08 DIAGNOSIS — E854 Organ-limited amyloidosis: Secondary | ICD-10-CM | POA: Diagnosis not present

## 2022-07-08 DIAGNOSIS — E785 Hyperlipidemia, unspecified: Secondary | ICD-10-CM | POA: Diagnosis not present

## 2022-07-08 DIAGNOSIS — I4891 Unspecified atrial fibrillation: Secondary | ICD-10-CM

## 2022-07-08 LAB — POCT INR: INR: 2.7 (ref 2.0–3.0)

## 2022-07-08 NOTE — Patient Instructions (Signed)
Description   Continue taking warfarin 1/2 tablet daily. Repeat INR in 4 weeks; Anticoagulation Clinic [phone]

## 2022-07-08 NOTE — Patient Instructions (Signed)
Medication Instructions:  The current medical regimen is effective;  continue present plan and medications.  *If you need a refill on your cardiac medications before your next appointment, please call your pharmacy*   Follow-Up: At [facility], you and your health needs are our priority.  As part of our continuing mission to provide you with exceptional heart care, we have created designated Provider Care Teams.  These Care Teams include your primary Cardiologist (physician) and Advanced Practice Providers (APPs -  Physician Assistants and Nurse Practitioners) who all work together to provide you with the care you need, when you need it.  We recommend signing up for the patient portal called "MyChart".  Sign up information is provided on this After Visit Summary.  MyChart is used to connect with patients for Virtual Visits (Telemedicine).  Patients are able to view lab/test results, encounter notes, upcoming appointments, etc.  Non-urgent messages can be sent to your provider as well.   To learn more about what you can do with MyChart, go to [email or web address].    Your next appointment:   6 month(s)  The format for your next appointment:   In Person  Provider:   [place] T O'Neal, MD        

## 2022-07-08 NOTE — Chronic Care Management (AMB) (Unsigned)
Chronic Care Management Pharmacy Assistant   Name: Regina [name]  MRN: [id number] DOB: 08/04/1941  Reason for Encounter: Medication Review / Medication Coordination Call   Recent office visits:  06/06/2022 Grier Mitts MD - Patient was seen for Ulcer of finger, unspecified ulcer stage and additional concerns. No medication changes. Follow up if symptoms worsen or fail to improve.  Recent consult visits:  None  Hospital visits:  None  Medications: Outpatient Encounter Medications as of 07/08/2022  Medication Sig   acetaminophen (TYLENOL) 500 MG tablet Take 1,000 mg by mouth every 6 (six) hours as needed for mild pain.   Alcohol Swabs (B-D SINGLE USE SWABS REGULAR) PADS Use to test blood sugar up to 3 times daily   allopurinol (ZYLOPRIM) 100 MG tablet TAKE ONE TABLET BY MOUTH EVERY MORNING   aspirin EC 81 MG EC tablet Take 1 tablet (81 mg total) by mouth daily. Swallow whole.   atorvastatin (LIPITOR) 20 MG tablet TAKE ONE TABLET BY MOUTH EVERYDAY AT BEDTIME   Blood Glucose Monitoring Suppl (ACCU-CHEK GUIDE) w/Device KIT 1 Device by Does not apply route 3 (three) times daily.   Capsaicin 0.033 % CREA Apply 1 application topically 3 (three) times daily. (Patient taking differently: Apply 1 application  topically 3 (three) times daily. As needed)   Cholecalciferol (VITAMIN D3) 50 MCG (2000 UT) capsule Take 2,000 Units by mouth daily.   Continuous Blood Gluc Sensor (DEXCOM G6 SENSOR) MISC 1 Device by Does not apply route as directed.   Continuous Blood Gluc Transmit (DEXCOM G6 TRANSMITTER) MISC 1 Device by Does not apply route as directed.   famotidine (PEPCID) 20 MG tablet Take 1 tablet (20 mg total) by mouth at bedtime.   FARXIGA 10 MG TABS tablet TAKE ONE TABLET BY MOUTH ONCE DAILY   Fluocinolone Acetonide 0.01 % OIL instill one drop in ears every 2-3 DAYS AS NEEDED FOR pruritis   gabapentin (NEURONTIN) 600 MG tablet TAKE ONE TABLET BY MOUTH EVERY MORNING and TAKE ONE TABLET BY  MOUTH EVERYDAY AT BEDTIME   glucose blood (ACCU-CHEK GUIDE) test strip Use to test blood sugar up to 3 times daily   insulin glargine, 1 Unit Dial, (TOUJEO SOLOSTAR) 300 UNIT/ML Solostar Pen Inject 24 Units into the skin daily. Eat a snack with protein nightly before bedtime.   insulin lispro (HUMALOG KWIKPEN) 100 UNIT/ML KwikPen Max daily 30 units   Insulin Pen Needle 32G X 4 MM MISC 1 Device by Does not apply route in the morning, at noon, in the evening, and at bedtime.   ipratropium (ATROVENT) 0.06 % nasal spray Place 2 sprays into both nostrils 4 (four) times daily. (Patient taking differently: Place 2 sprays into both nostrils 4 (four) times daily. As needed)   ketoconazole (NIZORAL) 2 % shampoo Apply 1 application. topically 2 (two) times a week.   Lancets Misc. (ACCU-CHEK FASTCLIX LANCET) KIT Use to test blood sugar up to 3 times daily   levothyroxine (SYNTHROID) 150 MCG tablet TAKE ONE TABLET BY MOUTH BEFORE BREAKFAST   magnesium oxide (MAG-OX) 400 MG tablet TAKE ONE TABLET BY MOUTH EVERY MORNING   meclizine (ANTIVERT) 25 MG tablet Take 25 mg by mouth daily as needed for dizziness.   metoprolol succinate (TOPROL-XL) 25 MG 24 hr tablet TAKE ONE TABLET BY MOUTH ONCE DAILY with OR immedaitely following A meal   nitroGLYCERIN (NITROSTAT) 0.4 MG SL tablet Dissolve 1 tab under tongue as needed for chest pain. May repeat every 5 minutes x 2  doses. If no relief call 9-1-1.   pantoprazole (PROTONIX) 20 MG tablet TAKE ONE TABLET BY MOUTH ONCE DAILY   Polyethylene Glycol 3350 (MIRALAX PO) Take 1 Package by mouth daily as needed (constipation).   potassium chloride SA (KLOR-CON M20) 20 MEQ tablet Take 1 tablet (20 mEq total) by mouth daily.   Propylene Glycol (SYSTANE BALANCE OP) Place 1 drop into both eyes daily as needed (for dry eyes).   SALINE MIST SPRAY NA Place 2 sprays into the nose at bedtime.   Tafamidis 61 MG CAPS Take 61 mg by mouth daily.   torsemide (DEMADEX) 20 MG tablet Take 2 tablets  (40 mg total) by mouth daily.   trolamine salicylate (ASPERCREME) 10 % cream Apply 1 application. topically as needed for muscle pain.   warfarin (COUMADIN) 5 MG tablet TAKE 1/2 TO 1 TABLET BY MOUTH ONCE daily OR AS DIRECTED by THE coumadin clinic   No facility-administered encounter medications on file as of 07/08/2022.   Reviewed chart for medication changes ahead of medication coordination call.  BP Readings from Last 3 Encounters:  06/16/22 122/76  03/18/22 128/62  03/08/22 140/78    Lab Results  Component Value Date   HGBA1C 7.3 (A) 01/31/2022     Patient obtains medications through Adherence Packaging  30 Days    Last adherence delivery included: Warfarin 5 mg tablet (easy top vials) use as directed Klor-Con 20 meq - 1 tablet at breakfast Protonix 20 mg - 1 tablet at breakfast Aspirin 81 mg - 1 tablet at breakfast Cetirizine 10 mg 1 tablet at breakfast (OTC med) Atorvastatin 20 mg 1 tablet at bedtime Farxiga 10 mg 1 tablet at breakfast Metoprolol succinate 25 mg 1 tablet at breakfast Gabapentin 600 mg tablet  1 tablet at breakfast and 1 tablet at bedtime Levothyroxine 150 mcg 1 tablet before breakfast Magnesium oxide 400 mg 1 tablet at breakfast Allopurinol 100 mg 1 tablet at breakfast Humalog Kwikpen 100 un/ml - max daily 30 units Toujeo Solostart 300 units - inject 24 units daily Pen needles 32 g X 4 mm   Patient declined (meds) last month: Dexcom Toujeo Torsemide   Patient is due for next adherence delivery on: 06/22/2022   Called patient and reviewed medications and coordinated delivery.   This delivery to include: Warfarin 5 mg tablet (easy top vials) use as directed Klor-Con 20 meq - 1 tablet at breakfast Protonix 20 mg - 1 tablet at breakfast Aspirin 81 mg - 1 tablet at breakfast Cetirizine 10 mg 1 tablet at breakfast (OTC med) Atorvastatin 20 mg 1 tablet at bedtime Farxiga 10 mg 1 tablet at breakfast Metoprolol succinate 25 mg 1 tablet at  breakfast Gabapentin 600 mg tablet  1 tablet at breakfast and 1 tablet at bedtime Levothyroxine 150 mcg 1 tablet before breakfast Magnesium oxide 400 mg 1 tablet at breakfast Allopurinol 100 mg 1 tablet at breakfast Humalog Kwikpen 100 un/ml - max daily 30 units Pen needles 32 g X 4 mm   Patient will need a short fill:    Coordinated acute fill:    Declined the following medications:  Torsemide not due until 08/14/2022 Dexcom not due til 08/05/2022 Toujeo not due til 08/16/2022 Nitrostat 0.41m - 1 tablet every 5 min as needed   Fluocinolone Acetonide 0.01 % OIL (ear drop) Ketoconazole (NIZORAL) 2 % shampoo  Unable to confirmed delivery date of 07/21/2022 before CCM deadline date.   Confirmed delivery date of 07/21/2022, advised patient that pharmacy will contact them the  morning of delivery.  Care Gaps: AWV - scheduled 02/20/2023 Last BP - 122/76 on 06/16/2022 Last A1C - 7.3 on 01/31/2022 Covid booster - overdue Shingrix - overdue Flu - due  Star Rating Drugs: Atorvastatin 106m - last filled 06/20/2022 30 DS at [facility] Farxiga 129m- last filled 06/20/2022 [phone]

## 2022-07-10 ENCOUNTER — Other Ambulatory Visit: Payer: Self-pay | Admitting: Cardiovascular Disease

## 2022-07-10 DIAGNOSIS — I4891 Unspecified atrial fibrillation: Secondary | ICD-10-CM

## 2022-07-11 NOTE — Telephone Encounter (Signed)
Prescription refill request received for warfarin Lov: 07/08/22 ([place])  Next INR check:  08/05/22 Warfarin tablet strength: '5mg'$   Appropriate dose and refill sent to requested pharmacy.

## 2022-07-14 DIAGNOSIS — G4733 Obstructive sleep apnea (adult) (pediatric): Secondary | ICD-10-CM | POA: Diagnosis not present

## 2022-07-16 DIAGNOSIS — I509 Heart failure, unspecified: Secondary | ICD-10-CM | POA: Diagnosis not present

## 2022-07-16 DIAGNOSIS — I4891 Unspecified atrial fibrillation: Secondary | ICD-10-CM | POA: Diagnosis not present

## 2022-07-16 DIAGNOSIS — N189 Chronic kidney disease, unspecified: Secondary | ICD-10-CM

## 2022-07-16 DIAGNOSIS — I130 Hypertensive heart and chronic kidney disease with heart failure and stage 1 through stage 4 chronic kidney disease, or unspecified chronic kidney disease: Secondary | ICD-10-CM

## 2022-07-16 DIAGNOSIS — Z794 Long term (current) use of insulin: Secondary | ICD-10-CM

## 2022-07-16 DIAGNOSIS — E1122 Type 2 diabetes mellitus with diabetic chronic kidney disease: Secondary | ICD-10-CM | POA: Diagnosis not present

## 2022-07-27 ENCOUNTER — Telehealth: Payer: Self-pay | Admitting: Cardiovascular Disease

## 2022-07-27 NOTE — Telephone Encounter (Signed)
Patient stated she has been approved for Amvuttra and they will be shipping out the medication on 10/17.  Patient stated they suggested the patient start on Vitamin A.  Patient would like to know if she should start taking the Vitamin A now or wait until she receives the medication.  If she needs to start taking the Vitamin A now patient wants to know if she will need a prescription for this vitamin or if she can take an over-the-counter version.

## 2022-07-27 NOTE — Telephone Encounter (Signed)
Pt stated she was approve for amvutrra and wanted to know if medication is ok to take with her heart condition and medication.  Will forward to MD

## 2022-07-28 ENCOUNTER — Telehealth: Payer: Self-pay | Admitting: Family Medicine

## 2022-07-28 NOTE — Telephone Encounter (Signed)
Pt updated and verbalized understanding.   Geralynn Rile, MD  You; Caprice Beaver, LPN 16 hours ago (1:77 PM)    Yes, it is ok to take.   [name], MD, [place]  [address] [place]  [place], [place] [address]  [phone]  8:27 PM

## 2022-07-28 NOTE — Telephone Encounter (Signed)
Pt called to inform MD that her heart doctor wants her to start taking vitamin A with new heart medicine that he has prescribed.  Pt was told that her PCP has to give her a prescription for the Vitamin A.  Pt would like to know if she has to come in for a visit first?  Please advise.  [facility] - [place], [place] - [address] Phone:  [phone]  Fax:  [phone]

## 2022-07-29 NOTE — Telephone Encounter (Signed)
I called and spoke with patient. She is aware of message below. 

## 2022-07-29 NOTE — Telephone Encounter (Signed)
Vit A can be obtained OTC. If prescription is needed, she can requested from provider that recommended supplementation. Thanks, BJ

## 2022-08-01 ENCOUNTER — Ambulatory Visit: Payer: Medicare Other | Admitting: Internal Medicine

## 2022-08-01 ENCOUNTER — Encounter: Payer: Self-pay | Admitting: Internal Medicine

## 2022-08-01 VITALS — BP 100/74 | HR 80 | Ht 62.0 in | Wt 224.0 lb

## 2022-08-01 DIAGNOSIS — E1140 Type 2 diabetes mellitus with diabetic neuropathy, unspecified: Secondary | ICD-10-CM

## 2022-08-01 DIAGNOSIS — E1122 Type 2 diabetes mellitus with diabetic chronic kidney disease: Secondary | ICD-10-CM

## 2022-08-01 DIAGNOSIS — Z23 Encounter for immunization: Secondary | ICD-10-CM | POA: Diagnosis not present

## 2022-08-01 DIAGNOSIS — Z794 Long term (current) use of insulin: Secondary | ICD-10-CM | POA: Diagnosis not present

## 2022-08-01 DIAGNOSIS — E11319 Type 2 diabetes mellitus with unspecified diabetic retinopathy without macular edema: Secondary | ICD-10-CM | POA: Diagnosis not present

## 2022-08-01 DIAGNOSIS — E1159 Type 2 diabetes mellitus with other circulatory complications: Secondary | ICD-10-CM

## 2022-08-01 DIAGNOSIS — N1831 Chronic kidney disease, stage 3a: Secondary | ICD-10-CM

## 2022-08-01 LAB — POCT GLYCOSYLATED HEMOGLOBIN (HGB A1C): Hemoglobin A1C: 7.5 % — AB (ref 4.0–5.6)

## 2022-08-01 MED ORDER — INSULIN PEN NEEDLE 32G X 4 MM MISC: 1.0000 | Freq: Four times a day (QID) | 3 refills | Status: DC

## 2022-08-01 MED ORDER — TOUJEO SOLOSTAR 300 UNIT/ML ~~LOC~~ SOPN: 22.0000 [IU] | PEN_INJECTOR | Freq: Every day | SUBCUTANEOUS | 6 refills | Status: DC

## 2022-08-01 MED ORDER — INSULIN LISPRO (1 UNIT DIAL) 100 UNIT/ML (KWIKPEN): PEN_INJECTOR | SUBCUTANEOUS | 3 refills | Status: DC

## 2022-08-01 NOTE — Patient Instructions (Addendum)
-   Decrease Toujeo  22 units daily  - Humalog correctional insulin: Use the scale below to help guide you BEFORE each meal   Blood sugar before meal Number of units to inject  80- 165 3 unit  166 - 200 4 units  201 - 235 5 units  236 - 235 6 units  236 -270 7 units  271 - 305 8 units  306 - 340 9 units  341 - 375 10 units  376 - 410 11 units     HOW TO TREAT LOW BLOOD SUGARS (Blood sugar LESS THAN 70 MG/DL) Please follow the RULE OF 15 for the treatment of hypoglycemia treatment (when your (blood sugars are less than 70 mg/dL)   STEP 1: Take 15 grams of carbohydrates when your blood sugar is low, which includes:  3-4 GLUCOSE TABS  OR 3-4 OZ OF JUICE OR REGULAR SODA OR ONE TUBE OF GLUCOSE GEL    STEP 2: RECHECK blood sugar in 15 MINUTES STEP 3: If your blood sugar is still low at the 15 minute recheck --> then, go back to STEP 1 and treat AGAIN with another 15 grams of carbohydrates.

## 2022-08-01 NOTE — Progress Notes (Signed)
Name: Regina [name]  Age/ Sex: 81 y.o., female   MRN/ DOB: [id number], 04/24/1941     PCP: [name], [name], MD   Reason for Endocrinology Evaluation: Type 2 Diabetes Mellitus  Initial Endocrine Consultative Visit: 09/09/2020    PATIENT IDENTIFIER: Regina [name] is a 81 y.o. female with a past medical history of T2DM, HTN, CAD, OSA on CPAP  and A.Fib. The patient has followed with Endocrinology clinic since 09/09/2020 for consultative assistance with management of her diabetes.  DIABETIC HISTORY:  Ms. Mapp was diagnosed with DM yrs ago. Her hemoglobin A1c has ranged from 6.9% in 2021, peaking at 7.8% in 2020   On her initial visit to our clinic she had an A1c of 7.4% She was on basal insulin and Ozemopic, she was already out of Ozempic and we held off until more data was available about her retinopathy. We started humalog per correction scale   She was started on Farxiga by cardiology 11/5636 Started Trulicity   Nephrology [facility] - Dr. peoples  SUBJECTIVE:   During the last visit (01/31/2022): A1c 7.3 %    Today (08/01/2022): [name] is here for a follow up on diabetes management.. She checks her blood sugars multiple times a day through CGM . The patient has not had hypoglycemic episodes since the last clinic visit which typically occur a night   She is off prednisone for Polymyagia rheumatica No nausea  vomiting or diarrhea      HOME DIABETES REGIMEN:  Toujeo 24 units daily  Humalog 7 TIDQAM - not taking  CF: Humalog: ( BG-125/30) Farxiga 10 mg daily-through cardiology    Statin: yes ACE-I/ARB: no    CONTINUOUS GLUCOSE MONITORING RECORD INTERPRETATION    Dates of Recording:10/3-10/16/2023  Sensor description:dexcom  Results statistics:   CGM use % of time 93  Average and SD 160/47  Time in range  68 %  % Time Above 180 26  % Time above 250 5  % Time Below target <1     Glycemic patterns summary: BG's trend down  overnight but high during the day   Hyperglycemic episodes  postprandial   Hypoglycemic episodes occurred n/a  Overnight periods: trends down    DIABETIC COMPLICATIONS: Microvascular complications:  CKD III, Retinopathy ( hx of laser )  Denies: neuropathy  Last eye exam: Completed 2021   Macrovascular complications:  Non-obstructive CAD  Denies: PVD, CVA     HISTORY:  Past Medical History:  Past Medical History:  Diagnosis Date   Back pain    CHF (congestive heart failure) (HCC)    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 01/2015 Cardiolite: + inf wall ischemia, EF 56%;  b. 01/2015 Cath: LM nl, LAD 33m LCX min irregs, RCA dominant, 50-638m  Sleep apnea    Swallowing difficulty    Past Surgical History:  Past Surgical History:  Procedure Laterality Date   ABDOMINAL HYSTERECTOMY     BUBBLE STUDY  07/01/2020   Procedure: BUBBLE STUDY;  Surgeon: AcElouise MunroeMD;  [facility] Service: Cardiovascular;;   BUBBLE STUDY  08/05/2020   Procedure: BUBBLE STUDY;  Surgeon: Geralynn Rile, MD;  Location: [facility];  Service: Cardiovascular;;  done with definity    CARDIAC CATHETERIZATION     ESOPHAGOGASTRODUODENOSCOPY     a. 01/2015 EGD: patent esophagus.   ESOPHAGOGASTRODUODENOSCOPY N/A 01/20/2015   Procedure: ESOPHAGOGASTRODUODENOSCOPY (EGD);  Surgeon: Carol Ada, MD;  Location: [facility] ENDOSCOPY;  Service: Endoscopy;  Laterality: N/A;   HAND SURGERY     JOINT REPLACEMENT     reports history bilateral TKA and right TSA   LEFT HEART CATHETERIZATION WITH CORONARY ANGIOGRAM N/A 01/19/2015   RIGHT HEART CATH N/A 12/27/2021   Procedure: RIGHT HEART CATH;  Surgeon: Lorretta Harp, MD;  Location: [facility];  Service: Cardiovascular;  Laterality: N/A;   TEE WITHOUT CARDIOVERSION  05/04/2020   TEE WITHOUT CARDIOVERSION N/A 05/05/2020   Procedure: TRANSESOPHAGEAL ECHOCARDIOGRAM (TEE);  Surgeon: Pixie Casino, MD;  Location[facility] ENDOSCOPY;  Service: Cardiovascular;  Laterality: N/A;   TEE WITHOUT CARDIOVERSION N/A 07/01/2020   Procedure: TRANSESOPHAGEAL ECHOCARDIOGRAM (TEE);  Surgeon: Elouise Munroe, MD;  Location: [facility];  Service: Cardiovascular;  Laterality: N/A;   TEE WITHOUT CARDIOVERSION N/A 08/05/2020   Procedure: TRANSESOPHAGEAL ECHOCARDIOGRAM (TEE);  Surgeon: Geralynn Rile, MD;  Location: [place] [facility];  Service: Cardiovascular;  Laterality: N/A;   THYROIDECTOMY     Social History:  reports that she has never smoked. She has never used smokeless tobacco. She reports that she does not drink alcohol and does not use drugs. Family History:  Family History  Problem Relation Age of Onset   Heart disease Mother    Hypertension Mother    Cancer Father    Alcoholism Father      HOME MEDICATIONS: Allergies as of 08/01/2022       Reactions   Ace Inhibitors Other (See Comments)   unknown   Amlodipine Other (See Comments)   unknown   Atenolol Other (See Comments)   bradycardia   Avandia [rosiglitazone] Other (See Comments)   unknown   Darvon [propoxyphene] Other (See Comments)   unknown   Erythromycin Itching   Hydralazine Other (See Comments)   Burning in throat and chest   Hydrocodone Other (See Comments)   Hallucinations.   Levofloxacin Itching   Morphine And Related Other (See Comments)   Dizzy and hallucianation, vomiting; Willing to try low dose   Percocet [oxycodone-acetaminophen] Other (See Comments)   hallucination   Spironolactone Other (See Comments)   unknown   Tramadol Other (See Comments)   Unknown/does not recall reaction but does not want to take again        Medication List        Accurate  as of August 01, 2022  1:34 PM. If you have any questions, ask your nurse or doctor.          Accu-Chek [employer] Kit Use to test blood sugar up to 3 times daily   Accu-Chek Guide test strip Generic drug: glucose blood Use to test blood sugar up to 3 times daily   Accu-Chek Guide w/Device Kit 1 Device by Does not apply route 3 (three) times daily.   acetaminophen 500 MG tablet Commonly known as: TYLENOL Take 1,000 mg by mouth every 6 (six) hours as needed for mild pain.   allopurinol 100 MG tablet Commonly known as: ZYLOPRIM TAKE ONE TABLET BY MOUTH EVERY MORNING   aspirin EC 81 MG tablet Take 1 tablet (81 mg total) by mouth daily. Swallow whole.   atorvastatin 20 MG tablet Commonly known as:  LIPITOR TAKE ONE TABLET BY MOUTH EVERYDAY AT BEDTIME   B-D SINGLE USE SWABS REGULAR Pads Use to test blood sugar up to 3 times daily   Dexcom G6 Sensor Misc 1 Device by Does not apply route as directed.   Dexcom G6 Transmitter Misc 1 Device by Does not apply route as directed.   famotidine 20 MG tablet Commonly known as: PEPCID Take 1 tablet (20 mg total) by mouth at bedtime.   Farxiga 10 MG Tabs tablet Generic drug: dapagliflozin propanediol TAKE ONE TABLET BY MOUTH ONCE DAILY   Fluocinolone Acetonide 0.01 % Oil instill one drop in ears every 2-3 DAYS AS NEEDED FOR pruritis   gabapentin 600 MG tablet Commonly known as: NEURONTIN TAKE ONE TABLET BY MOUTH EVERY MORNING and TAKE ONE TABLET BY MOUTH EVERYDAY AT BEDTIME   insulin lispro 100 UNIT/ML KwikPen Commonly known as: HumaLOG KwikPen Max daily 30 units   Insulin Pen Needle 32G X 4 MM Misc 1 Device by Does not apply route in the morning, at noon, in the evening, and at bedtime.   ipratropium 0.06 % nasal spray Commonly known as: ATROVENT Place 2 sprays into both nostrils 4 (four) times daily. What changed: additional instructions   ketoconazole 2 % shampoo Commonly known as: Nizoral Apply 1  application. topically 2 (two) times a week.   levothyroxine 150 MCG tablet Commonly known as: SYNTHROID TAKE ONE TABLET BY MOUTH BEFORE BREAKFAST   magnesium oxide 400 MG tablet Commonly known as: MAG-OX TAKE ONE TABLET BY MOUTH EVERY MORNING   meclizine 25 MG tablet Commonly known as: ANTIVERT Take 25 mg by mouth daily as needed for dizziness.   metoprolol succinate 25 MG 24 hr tablet Commonly known as: TOPROL-XL TAKE ONE TABLET BY MOUTH ONCE DAILY with OR immedaitely following A meal   MIRALAX PO Take 1 Package by mouth daily as needed (constipation).   nitroGLYCERIN 0.4 MG SL tablet Commonly known as: NITROSTAT Dissolve 1 tab under tongue as needed for chest pain. May repeat every 5 minutes x 2 doses. If no relief call 9-1-1.   pantoprazole 20 MG tablet Commonly known as: PROTONIX TAKE ONE TABLET BY MOUTH ONCE DAILY   potassium chloride SA 20 MEQ tablet Commonly known as: Klor-Con M20 Take 1 tablet (20 mEq total) by mouth daily.   SALINE MIST SPRAY NA Place 2 sprays into the nose at bedtime.   SYSTANE BALANCE OP Place 1 drop into both eyes daily as needed (for dry eyes).   Tafamidis 61 MG Caps Take 61 mg by mouth daily.   torsemide 20 MG tablet Commonly known as: DEMADEX Take 2 tablets (40 mg total) by mouth daily.   Toujeo SoloStar 300 UNIT/ML Solostar Pen Generic drug: insulin glargine (1 Unit Dial) Inject 24 Units into the skin daily. Eat a snack with protein nightly before bedtime.   trolamine salicylate 10 % cream Commonly known as: ASPERCREME Apply 1 application. topically as needed for muscle pain.   Vitamin D3 50 MCG (2000 UT) capsule Take 2,000 Units by mouth daily.   warfarin 5 MG tablet Commonly known as: COUMADIN Take as directed by the anticoagulation clinic. If you are unsure how to take this medication, talk to your nurse or doctor. Original instructions: TAKE 1/2 TABLET BY MOUTH ONCE DAILY OR AS DIRECTED BY COUMADIN CLINIC          OBJECTIVE:   Vital Signs: BP 100/74 (BP Location: Left Arm, Patient Position: Sitting, Cuff Size: Large)   Pulse 80  Ht _0  (1.575 m)   Wt 224 lb (101.6 kg)   SpO2 99%   BMI 40.97 kg/m   Wt Readings from Last 3 Encounters:  08/01/22 224 lb (101.6 kg)  07/08/22 226 lb 3.2 oz (102.6 kg)  06/16/22 225 lb 3.2 oz (102.2 kg)     Exam: General: Pt appears well and is in NAD  Lungs: Clear with good BS bilat   Heart: RRR   Extremities: No pretibial edema.   Neuro: MS is good with appropriate affect, pt is alert and Ox3     DM foot exam: 01/31/2022   The skin of the feet is intact without sores or ulcerations. The pedal pulses are 1+ B/L The sensation is decreased to a screening 5.07, 10 gram monofilament bilaterally   DATA REVIEWED:  Lab Results  Component Value Date   HGBA1C 7.3 (A) 01/31/2022   HGBA1C 7.6 (A) 08/13/2021   HGBA1C 7.9 (H) 08/11/2021     Latest Reference Range & Units 05/17/22 00:00  Sodium 137 - 147  138 (E)  Potassium 3.5 - 5.1 mEq/L 4.1 (E)  Chloride 99 - 108  100 (E)  CO2 13 - 22  31 ! (E)  Glucose  130 (E)  BUN 4 - 21  46 ! (E)  Creatinine 0.5 - 1.1  2.2 ! (E)  Calcium 8.7 - 10.7  9.2 (E)  Albumin 3.5 - 5.0  3.9 (E)     ASSESSMENT / PLAN / RECOMMENDATIONS:   1) Type 2 Diabetes Mellitus, Optimally controlled, With retinopathic, neuropathic , CKD IV and Macrovascular   complications - Most recent A1c of 7.5 %. Goal A1c < 7.5 %.    -A1c remains stable  - She has not been using the standing dose of Humalog with each meal but has been using correction scale only, I am going to adjust her scale so is easier for her to do -I am also going to reduce her basal rate as she has been noted with tight BG's overnight -I am also going to change her correction scale, as she has been noted with a quick drop in BG's with the use of the scale -Trulicity has been cost prohibitive -She is on [place] through cardiology -   MEDICATIONS: Decrease  Toujeo  22 units daily  Start  Humalog 3 units with each meal  Change correction Scale  : Humalog ( BG -130/35) Patient on Farxiga 10 mg through  cardiology    EDUCATION / INSTRUCTIONS: BG monitoring instructions: Patient is instructed to check her blood sugars 3 times a day, before meals . Call [facility] if: BG persistently < 70  I reviewed the Rule of 15 for the treatment of hypoglycemia in detail with the patient. Literature supplied.      2) Diabetic complications:  Eye: Does  have known diabetic retinopathy.  Neuro/ Feet: Does have known diabetic peripheral neuropathy. Renal: Patient does have known baseline CKD. She is not on an ACEI/ARB at present.      F/U in 6 months    Signed electronically by: Mack Guise, MD  [facility] Endocrinology  [address] [address] [place], [place] [address] Phone: [phone] FAX: [phone]   CC: [name], [name], [place] [address] Phone: [phone]  Fax: [phone]  Return to Endocrinology clinic as below: Future Appointments  Date Time Provider [place]  08/01/2022  1:40 PM Therron Sells, Melanie Crazier, MD [facility] None  08/05/2022  3:15 PM [facility] None  08/09/2022 10:00 AM [facility] CARE [facility]  09/19/2022 11:00 AM [name], [name], MD [facility]  11/30/2022  2:00 PM [facility] CCM PHARMACIST [facility]  02/20/2023 10:00 AM [facility]-NURSE HEALTH ADVISOR [facility]

## 2022-08-02 ENCOUNTER — Encounter: Payer: Self-pay | Admitting: Internal Medicine

## 2022-08-03 ENCOUNTER — Ambulatory Visit: Payer: Medicare Other | Admitting: Internal Medicine

## 2022-08-05 ENCOUNTER — Ambulatory Visit: Payer: Medicare Other | Attending: Cardiovascular Disease | Admitting: Student

## 2022-08-05 DIAGNOSIS — I4819 Other persistent atrial fibrillation: Secondary | ICD-10-CM

## 2022-08-05 DIAGNOSIS — Z7901 Long term (current) use of anticoagulants: Secondary | ICD-10-CM

## 2022-08-05 DIAGNOSIS — I4891 Unspecified atrial fibrillation: Secondary | ICD-10-CM | POA: Diagnosis not present

## 2022-08-05 LAB — POCT INR: INR: 2.7 (ref 2.0–3.0)

## 2022-08-05 NOTE — Patient Instructions (Signed)
Continue taking warfarin 1/2 tablet daily. Repeat INR in 4 weeks; Anticoagulation Clinic [phone]

## 2022-08-09 ENCOUNTER — Telehealth: Payer: Medicare Other

## 2022-08-10 ENCOUNTER — Telehealth: Payer: Self-pay | Admitting: Pharmacist

## 2022-08-10 NOTE — Chronic Care Management (AMB) (Signed)
Chronic Care Management Pharmacy Assistant   Name: Regina [name]  MRN: [id number] DOB: 1941/07/15  Reason for Encounter: Medication Review / Medication Coordination Call   Recent office visits:  None  Recent consult visits:  08/01/2022 [id number] MD(endocrinology) - Patient was seen for Type 2 diabetes mellitus with diabetic neuropathy, with long-term current use of insulin and additional concerns. Decreased Toujeo to 22 units daily. Follow up in 6 months.   07/08/2022 [name] MD (cardiology) - Patient was seen for Persistent atrial fibrillation and additional concerns. Discontinued Capsaicin. Follow up in 6 months.   Hospital visits:  None  Medications: Outpatient Encounter Medications as of 08/10/2022  Medication Sig   acetaminophen (TYLENOL) 500 MG tablet Take 1,000 mg by mouth every 6 (six) hours as needed for mild pain.   Alcohol Swabs (B-D SINGLE USE SWABS REGULAR) PADS Use to test blood sugar up to 3 times daily   allopurinol (ZYLOPRIM) 100 MG tablet TAKE ONE TABLET BY MOUTH EVERY MORNING   aspirin EC 81 MG EC tablet Take 1 tablet (81 mg total) by mouth daily. Swallow whole.   atorvastatin (LIPITOR) 20 MG tablet TAKE ONE TABLET BY MOUTH EVERYDAY AT BEDTIME   Blood Glucose Monitoring Suppl (ACCU-CHEK GUIDE) w/Device KIT 1 Device by Does not apply route 3 (three) times daily.   Cholecalciferol (VITAMIN D3) 50 MCG (2000 UT) capsule Take 2,000 Units by mouth daily.   Continuous Blood Gluc Sensor (DEXCOM G6 SENSOR) MISC 1 Device by Does not apply route as directed.   Continuous Blood Gluc Transmit (DEXCOM G6 TRANSMITTER) MISC 1 Device by Does not apply route as directed.   famotidine (PEPCID) 20 MG tablet Take 1 tablet (20 mg total) by mouth at bedtime.   FARXIGA 10 MG TABS tablet TAKE ONE TABLET BY MOUTH ONCE DAILY   Fluocinolone Acetonide 0.01 % OIL instill one drop in ears every 2-3 DAYS AS NEEDED FOR pruritis   gabapentin (NEURONTIN) 600 MG tablet TAKE ONE  TABLET BY MOUTH EVERY MORNING and TAKE ONE TABLET BY MOUTH EVERYDAY AT BEDTIME   glucose blood (ACCU-CHEK GUIDE) test strip Use to test blood sugar up to 3 times daily   insulin glargine, 1 Unit Dial, (TOUJEO SOLOSTAR) 300 UNIT/ML Solostar Pen Inject 22 Units into the skin daily. Eat a snack with protein nightly before bedtime.   insulin lispro (HUMALOG KWIKPEN) 100 UNIT/ML KwikPen Max daily 30 units   Insulin Pen Needle 32G X 4 MM MISC 1 Device by Does not apply route in the morning, at noon, in the evening, and at bedtime.   ipratropium (ATROVENT) 0.06 % nasal spray Place 2 sprays into both nostrils 4 (four) times daily. (Patient taking differently: Place 2 sprays into both nostrils 4 (four) times daily. As needed)   ketoconazole (NIZORAL) 2 % shampoo Apply 1 application. topically 2 (two) times a week.   Lancets Misc. (ACCU-CHEK FASTCLIX LANCET) KIT Use to test blood sugar up to 3 times daily   levothyroxine (SYNTHROID) 150 MCG tablet TAKE ONE TABLET BY MOUTH BEFORE BREAKFAST   magnesium oxide (MAG-OX) 400 MG tablet TAKE ONE TABLET BY MOUTH EVERY MORNING   meclizine (ANTIVERT) 25 MG tablet Take 25 mg by mouth daily as needed for dizziness.   metoprolol succinate (TOPROL-XL) 25 MG 24 hr tablet TAKE ONE TABLET BY MOUTH ONCE DAILY with OR immedaitely following A meal   nitroGLYCERIN (NITROSTAT) 0.4 MG SL tablet Dissolve 1 tab under tongue as needed for chest pain. May repeat every  5 minutes x 2 doses. If no relief call 9-1-1.   pantoprazole (PROTONIX) 20 MG tablet TAKE ONE TABLET BY MOUTH ONCE DAILY   Polyethylene Glycol 3350 (MIRALAX PO) Take 1 Package by mouth daily as needed (constipation).   potassium chloride SA (KLOR-CON M20) 20 MEQ tablet Take 1 tablet (20 mEq total) by mouth daily.   Propylene Glycol (SYSTANE BALANCE OP) Place 1 drop into both eyes daily as needed (for dry eyes).   SALINE MIST SPRAY NA Place 2 sprays into the nose at bedtime.   Tafamidis 61 MG CAPS Take 61 mg by mouth  daily.   torsemide (DEMADEX) 20 MG tablet Take 2 tablets (40 mg total) by mouth daily.   trolamine salicylate (ASPERCREME) 10 % cream Apply 1 application. topically as needed for muscle pain.   warfarin (COUMADIN) 5 MG tablet TAKE 1/2 TABLET BY MOUTH ONCE DAILY OR AS DIRECTED BY COUMADIN CLINIC   No facility-administered encounter medications on file as of 08/10/2022.   Reviewed chart for medication changes ahead of medication coordination call.  BP Readings from Last 3 Encounters:  08/01/22 100/74  07/08/22 123/77  06/16/22 122/76    Lab Results  Component Value Date   HGBA1C 7.5 (A) 08/01/2022     Patient obtains medications through Adherence Packaging  30 Days    Last adherence delivery included: Warfarin 5 mg tablet (easy top vials) use as directed Klor-Con 20 meq - 1 tablet at breakfast Protonix 20 mg - 1 tablet at breakfast Aspirin 81 mg - 1 tablet at breakfast Cetirizine 10 mg 1 tablet at breakfast (OTC med) Atorvastatin 20 mg 1 tablet at bedtime Farxiga 10 mg 1 tablet at breakfast Metoprolol succinate 25 mg 1 tablet at breakfast Gabapentin 600 mg tablet  1 tablet at breakfast and 1 tablet at bedtime Levothyroxine 150 mcg 1 tablet before breakfast Magnesium oxide 400 mg 1 tablet at breakfast Allopurinol 100 mg 1 tablet at breakfast Humalog Kwikpen 100 un/ml - max daily 30 units Pen needles 32 g X 4 mm   Patient declined (meds) last month: [place]   Patient is due for next adherence delivery on: 08/22/2022   Called patient and reviewed medications and coordinated delivery.   This delivery to include: Warfarin 5 mg tablet (easy top vials) use as directed Allopurinol 100 mg 1 tablet at breakfast Klor-Con 20 meq - 1 tablet at breakfast Protonix 20 mg - 1 tablet at breakfast Aspirin 81 mg - 1 tablet at breakfast Magnesium oxide 400 mg 1 tablet at breakfast Cetirizine 10 mg 1 tablet at breakfast (OTC med) Atorvastatin 20 mg 1 tablet at  bedtime Farxiga 10 mg 1 tablet at breakfast Metoprolol succinate 25 mg 1 tablet at breakfast Gabapentin 600 mg tablet  1 tablet at breakfast and 1 tablet at bedtime Levothyroxine 150 mcg 1 tablet before breakfast Torsemide 20 mg take 2 tablets at breakfast Pen needles 32 g X 4 mm Toujeo solostar 300 un - inject 22 units daily Humalog kwikpen 100 un - inject 30 units max dose daily   Patient will need a short fill: No short fill needed   Coordinated acute fill: No acute fill needed   Declined the following medications:  Dexcom not due til 10/15/2022 Nitrostat 0.22m - 1 tablet every 5 min as needed   Fluocinolone Acetonide 0.01 % OIL (ear drop) Ketoconazole (NIZORAL) 2 % shampoo   Unable to confirmed delivery date of 08/22/2022 after multiple attempts.   Care Gaps: AWV - scheduled 02/20/2023  Last BP - 100/74 on 08/01/2022 Last A1C - 7.5 on 08/01/2022 Covid - overdue Shingrix - overdue  Star Rating Drugs: Atorvastatin 68m - last filled 07/18/2022 30 DS at [facility] Farxiga 122m- last [facility]18/2022 [id number]

## 2022-08-27 DIAGNOSIS — I10 Essential (primary) hypertension: Secondary | ICD-10-CM | POA: Diagnosis not present

## 2022-08-27 DIAGNOSIS — E119 Type 2 diabetes mellitus without complications: Secondary | ICD-10-CM | POA: Diagnosis not present

## 2022-08-27 DIAGNOSIS — S4992XA Unspecified injury of left shoulder and upper arm, initial encounter: Secondary | ICD-10-CM | POA: Diagnosis not present

## 2022-08-27 DIAGNOSIS — M25512 Pain in left shoulder: Secondary | ICD-10-CM | POA: Diagnosis not present

## 2022-08-27 DIAGNOSIS — M19012 Primary osteoarthritis, left shoulder: Secondary | ICD-10-CM | POA: Diagnosis not present

## 2022-08-27 DIAGNOSIS — Z8673 Personal history of transient ischemic attack (TIA), and cerebral infarction without residual deficits: Secondary | ICD-10-CM | POA: Diagnosis not present

## 2022-08-27 DIAGNOSIS — Z043 Encounter for examination and observation following other accident: Secondary | ICD-10-CM | POA: Diagnosis not present

## 2022-08-27 DIAGNOSIS — Z7901 Long term (current) use of anticoagulants: Secondary | ICD-10-CM | POA: Diagnosis not present

## 2022-08-29 ENCOUNTER — Telehealth: Payer: Self-pay | Admitting: Cardiovascular Disease

## 2022-08-29 MED ORDER — TORSEMIDE 20 MG PO TABS: 40.0000 mg | ORAL_TABLET | Freq: Every day | ORAL | 1 refills | Status: DC

## 2022-08-29 NOTE — Telephone Encounter (Signed)
*  STAT* If patient is at the pharmacy, call can be transferred to refill team.   1. Which medications need to be refilled? (please list name of each medication and dose if known)   torsemide (DEMADEX) 20 MG tablet (Expired)  2. Which pharmacy/location (including street and city if local pharmacy) is medication to be sent to?  [facility] - [place], [place] - [place] [address]   3. Do they need a 30 day or 90 day supply?  90 day  Patient stated she has been having more than usual swelling in her feet and has had to double up on this medication.  Patient stated she fell on Friday on her left side.  Patient stated she is out of her fluid pills and will need a refill.

## 2022-08-30 ENCOUNTER — Telehealth: Payer: Self-pay | Admitting: Cardiovascular Disease

## 2022-08-30 NOTE — Telephone Encounter (Signed)
I only see that she is to take torsemide (DEMADEX) 20 MG tablet   Take 2 tablets (40 mg total) by mouth daily. Checked EPIC I do not see that ok to take extra for increased edema.  Called pharmacy spoke with chastity, she states that pt is taking an extra 1/2 tab. Informed pharmacy that we did not direct her to do this either. Chastity states that pt received #23 and paid cash out of pocket. Will call pt to discuss.  Pt states that in the past that when she has called and we told her ok to take to take an extra for 3 days in the past so she has taken an extra for the last 2 days. She denies SOB, she just has the swelling and weight gain today and over the weekend. She states that her normal weight is 218-219# her weight today 222.6#. Her right foot is more swollen that the left. She states that if her weight >3# daily or 5#. She states that she has not taken her Torsemide today. She will open and take tomorrow's dosage. She will wait to hear from Dr Audie Box for future dosing, ok to take extra doses? Please advise.

## 2022-08-30 NOTE — Telephone Encounter (Signed)
Pt c/o medication issue:  1. Name of Medication: torsemide (DEMADEX) 20 MG tablet   2. How are you currently taking this medication (dosage and times per day)? 2 pills a day and then sometimes adds a half tab extra   3. Are you having a reaction (difficulty breathing--STAT)?   4. What is your medication issue?  [facility] is requesting call back to discuss this medication. They would like to make sure it will be okay to change her medication instructions to how she has been currently taking the meds when she has extra fluid retention.  Nurse, children's # [phone]

## 2022-08-30 NOTE — Progress Notes (Unsigned)
HPI: Regina [name] is a 81 y.o. female, who is here today to follow on recent ED visit. Elevated in the ED on 08/27/2022 complaining of left shoulder pain after fall.  persistent left shoulder pain following a fall on November 10th. She describes the pain as constant and throbbing, with a current intensity of 6 out of 10. The pain radiates to her neck and down her shoulder. She has a history of shoulder and neck pain and was previously diagnosed with polymyalgia rheumatica by Dr. Mary Sella ***.  denies hip pain and is able to comb her hair. She experiences pain in her left shoulder and neck, but not in her right shoulder. She has been using a cream called Panetrate for pain relief, which she believes helps somewhat. She reports that the pain is not affecting her sleep, states that she goes to sleep with the pain and wakes up with it still present.  Right shoulder x-ray done, it was negative for acute osseous abnormality.  Mild degenerative changes at the [facility] joint. *** BP during initial evaluation 192/94. ***  6 falls this year.  She reports that she is not currently taking meclizine, which was prescribed for dizziness. She has recently started a new medication, Amvuttra, for amyloidosis, which is administered as a shot every three months by a visiting nurse.  Additionally, the patient mentions an issue with her left index and third fingers, which develop blisters once a year that burst and heal on their own. She has been applying triamcinolone cream to the affected area. Her son is concerned about circulation in the finger. The issue is not causing pain and is not advancing to the whole finger.  Review of Systems See other pertinent positives and negatives in HPI.  Current Outpatient Medications on File Prior to Visit  Medication Sig Dispense Refill   acetaminophen (TYLENOL) 500 MG tablet Take 1,000 mg by mouth every 6 (six) hours as needed for mild pain.     Alcohol Swabs (B-D SINGLE  USE SWABS REGULAR) PADS Use to test blood sugar up to 3 times daily 100 each 3   allopurinol (ZYLOPRIM) 100 MG tablet TAKE ONE TABLET BY MOUTH EVERY MORNING 90 tablet 1   aspirin EC 81 MG EC tablet Take 1 tablet (81 mg total) by mouth daily. Swallow whole. 30 tablet 11   atorvastatin (LIPITOR) 20 MG tablet TAKE ONE TABLET BY MOUTH EVERYDAY AT BEDTIME 90 tablet 2   Blood Glucose Monitoring Suppl (ACCU-CHEK GUIDE) w/Device KIT 1 Device by Does not apply route 3 (three) times daily. 1 kit 0   Cholecalciferol (VITAMIN D3) 50 MCG (2000 UT) capsule Take 2,000 Units by mouth daily.     Continuous Blood Gluc Sensor (DEXCOM G6 SENSOR) MISC 1 Device by Does not apply route as directed. 9 each 3   Continuous Blood Gluc Transmit (DEXCOM G6 TRANSMITTER) MISC 1 Device by Does not apply route as directed. 1 each 3   famotidine (PEPCID) 20 MG tablet Take 1 tablet (20 mg total) by mouth at bedtime. 90 tablet 1   FARXIGA 10 MG TABS tablet TAKE ONE TABLET BY MOUTH ONCE DAILY 90 tablet 3   Fluocinolone Acetonide 0.01 % OIL instill one drop in ears every 2-3 DAYS AS NEEDED FOR pruritis 20 mL 1   gabapentin (NEURONTIN) 600 MG tablet TAKE ONE TABLET BY MOUTH EVERY MORNING and TAKE ONE TABLET BY MOUTH EVERYDAY AT BEDTIME 60 tablet 3   glucose blood (ACCU-CHEK GUIDE) test strip Use to  test blood sugar up to 3 times daily 100 each 3   insulin glargine, 1 Unit Dial, (TOUJEO SOLOSTAR) 300 UNIT/ML Solostar Pen Inject 22 Units into the skin daily. Eat a snack with protein nightly before bedtime. 15 mL 6   insulin lispro (HUMALOG KWIKPEN) 100 UNIT/ML KwikPen Max daily 30 units 30 mL 3   Insulin Pen Needle 32G X 4 MM MISC 1 Device by Does not apply route in the morning, at noon, in the evening, and at bedtime. 400 each 3   ipratropium (ATROVENT) 0.06 % nasal spray Place 2 sprays into both nostrils 4 (four) times daily. (Patient taking differently: Place 2 sprays into both nostrils 4 (four) times daily. As needed) 15 mL 3    ketoconazole (NIZORAL) 2 % shampoo Apply 1 application. topically 2 (two) times a week. 120 mL 2   Lancets Misc. (ACCU-CHEK FASTCLIX LANCET) KIT Use to test blood sugar up to 3 times daily 1 kit 1   levothyroxine (SYNTHROID) 150 MCG tablet TAKE ONE TABLET BY MOUTH BEFORE BREAKFAST 90 tablet 1   magnesium oxide (MAG-OX) 400 MG tablet TAKE ONE TABLET BY MOUTH EVERY MORNING 90 tablet 1   metoprolol succinate (TOPROL-XL) 25 MG 24 hr tablet TAKE ONE TABLET BY MOUTH ONCE DAILY with OR immedaitely following A meal 90 tablet 3   nitroGLYCERIN (NITROSTAT) 0.4 MG SL tablet Dissolve 1 tab under tongue as needed for chest pain. May repeat every 5 minutes x 2 doses. If no relief call 9-1-1. 25 tablet 2   pantoprazole (PROTONIX) 20 MG tablet TAKE ONE TABLET BY MOUTH ONCE DAILY 30 tablet 2   Polyethylene Glycol 3350 (MIRALAX PO) Take 1 Package by mouth daily as needed (constipation).     Propylene Glycol (SYSTANE BALANCE OP) Place 1 drop into both eyes daily as needed (for dry eyes).     SALINE MIST SPRAY NA Place 2 sprays into the nose at bedtime.     Tafamidis 61 MG CAPS Take 61 mg by mouth daily. 30 capsule 12   torsemide (DEMADEX) 20 MG tablet Take 2 tablets (40 mg total) by mouth daily. 180 tablet 1   trolamine salicylate (ASPERCREME) 10 % cream Apply 1 application. topically as needed for muscle pain.     warfarin (COUMADIN) 5 MG tablet TAKE 1/2 TABLET BY MOUTH ONCE DAILY OR AS DIRECTED BY COUMADIN CLINIC 50 tablet 1   potassium chloride SA (KLOR-CON M20) 20 MEQ tablet Take 1 tablet (20 mEq total) by mouth daily. 90 tablet 3   No current facility-administered medications on file prior to visit.   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 01/2015 Cardiolite: + inf wall ischemia, EF 56%;  b. 01/2015 Cath: LM nl, LAD 24m LCX min irregs, RCA dominant, 50-664m  Sleep apnea    Swallowing difficulty    Allergies  Allergen Reactions   Ace Inhibitors Other (See Comments)    unknown   Amlodipine Other (See Comments)    unknown   Atenolol Other (See Comments)  bradycardia   Avandia [Rosiglitazone] Other (See Comments)    unknown   Darvon [Propoxyphene] Other (See Comments)    unknown   Erythromycin Itching   Hydralazine Other (See Comments)    Burning in throat and chest   Hydrocodone Other (See Comments)    Hallucinations.   Levofloxacin Itching   Morphine And Related Other (See Comments)    Dizzy and hallucianation, vomiting; Willing to try low dose   Percocet [Oxycodone-Acetaminophen] Other (See Comments)    hallucination   Spironolactone Other (See Comments)    unknown   Tramadol Other (See Comments)    Unknown/does not recall reaction but does not want to take again    Social History   Socioeconomic History   Marital status: Widowed    Spouse name: Not on file   Number of children: Not on file   Years of education: Not on file   Highest education level: Not on file  Occupational History   Occupation: Retired  Tobacco Use   Smoking status: Never   Smokeless tobacco: Never  Vaping Use   Vaping Use: Never used  Substance and Sexual Activity   Alcohol use: No   Drug use: Never   Sexual activity: Not Currently  Other Topics Concern   Not on file  Social History Narrative   Lives with daughter   Social Determinants of Health   Financial Resource Strain: [place]  (02/16/2022)   Overall Financial Resource Strain (CARDIA)    Difficulty of Paying Living Expenses: Not hard at all  Food Insecurity: No [place] (02/16/2022)   Hunger Vital Sign    Worried About Running Out of Food in the  Last Year: Never true    [place] in the Last Year: Never true  Transportation Needs: No Transportation Needs (02/16/2022)   PRAPARE - Hydrologist (Medical): No    Lack of Transportation (Non-Medical): No  Physical Activity: Inactive (02/16/2022)   Exercise Vital Sign    Days of Exercise per Week: 0 days    Minutes of Exercise per Session: 0 min  Stress: No Stress Concern Present (02/16/2022)   [place]    Feeling of Stress : Not at all  Social Connections: Moderately Integrated (02/16/2022)   Social Connection and Isolation Panel [NHANES]    Frequency of Communication with Friends and Family: More than three times a week    Frequency of Social Gatherings with Friends and Family: More than three times a week    Attends Religious Services: More than 4 times per year    Active Member of [employer] or Organizations: Yes    Attends Archivist Meetings: More than 4 times per year    Marital Status: Widowed    Vitals:   08/31/22 1200  BP: 130/78  Pulse: 100  Resp: 16  SpO2: 98%   Body mass index is 40.97 kg/m.  Physical Exam Vitals and nursing note reviewed.  Constitutional:      General: She is not in acute distress.    Appearance: She is well-developed.  HENT:     Head: Normocephalic and atraumatic.     Right Ear: Tympanic membrane and external ear normal.     Left Ear: Tympanic membrane and external ear normal.     Ears:     Comments: Minimal dryness right ear canal , otherwise normal bilateral.    Mouth/Throat:     Mouth: Mucous  membranes are moist.     Pharynx: Oropharynx is clear.  Eyes:     Conjunctiva/sclera: Conjunctivae normal.  Cardiovascular:     Rate and Rhythm: Normal rate. Rhythm irregular.     Heart sounds: No murmur heard. Pulmonary:     Effort: Pulmonary effort is normal. No respiratory distress.     Breath sounds: Normal breath sounds.  Abdominal:      Palpations: Abdomen is soft. There is no hepatomegaly or mass.     Tenderness: There is no abdominal tenderness.  Musculoskeletal:     Cervical back: Tenderness present. No erythema. Pain with movement present. No muscular tenderness. Decreased range of motion.       Back:     Comments: Left index and 3rd finger tip  Lymphadenopathy:     Cervical: No cervical adenopathy.  Skin:    General: Skin is warm.     Findings: No erythema or rash.     Comments: Scalp with no rash.  Neurological:     General: No focal deficit present.     Mental Status: She is alert and oriented to person, place, and time.     Comments: Gait assisted by a cane.  Psychiatric:     Comments: Well groomed, good eye contact.     ASSESSMENT AND PLAN:  Dalylah was seen today for hospitalization follow-up.  Diagnoses and all orders for this visit:  Frequent falls -     Ambulatory referral to Physical Therapy  Polymyalgia rheumatica ([place]) -     C-reactive protein; Future -     Sedimentation rate; Future  Left shoulder pain, unspecified chronicity -     Ambulatory referral to Physical Therapy  Essential hypertension    Orders Placed This Encounter  Procedures   C-reactive protein   Sedimentation rate   Ambulatory referral to Physical Therapy    No problem-specific Assessment & Plan notes found for this encounter.   Return if symptoms worsen or fail to improve, for keep next appointment.  Tempie Gibeault G. [name], MD  [facility]. [place] office.

## 2022-08-31 ENCOUNTER — Encounter: Payer: Self-pay | Admitting: Family Medicine

## 2022-08-31 ENCOUNTER — Ambulatory Visit (INDEPENDENT_AMBULATORY_CARE_PROVIDER_SITE_OTHER): Payer: Medicare Other | Admitting: Family Medicine

## 2022-08-31 VITALS — BP 130/78 | HR 100 | Resp 16 | Ht 62.0 in | Wt 224.0 lb

## 2022-08-31 DIAGNOSIS — R296 Repeated falls: Secondary | ICD-10-CM

## 2022-08-31 DIAGNOSIS — M25512 Pain in left shoulder: Secondary | ICD-10-CM

## 2022-08-31 DIAGNOSIS — L819 Disorder of pigmentation, unspecified: Secondary | ICD-10-CM

## 2022-08-31 DIAGNOSIS — I10 Essential (primary) hypertension: Secondary | ICD-10-CM | POA: Diagnosis not present

## 2022-08-31 DIAGNOSIS — M353 Polymyalgia rheumatica: Secondary | ICD-10-CM

## 2022-08-31 DIAGNOSIS — L98499 Non-pressure chronic ulcer of skin of other sites with unspecified severity: Secondary | ICD-10-CM | POA: Diagnosis not present

## 2022-08-31 LAB — CBC WITH DIFFERENTIAL/PLATELET
Basophils Absolute: 0 10*3/uL (ref 0.0–0.1)
Basophils Relative: 0.7 % (ref 0.0–3.0)
Eosinophils Absolute: 0.1 10*3/uL (ref 0.0–0.7)
Eosinophils Relative: 1.6 % (ref 0.0–5.0)
HCT: 37.5 % (ref 36.0–46.0)
Hemoglobin: 12.2 g/dL (ref 12.0–15.0)
Lymphocytes Relative: 20.5 % (ref 12.0–46.0)
Lymphs Abs: 1.2 10*3/uL (ref 0.7–4.0)
MCHC: 32.6 g/dL (ref 30.0–36.0)
MCV: 88.8 fl (ref 78.0–100.0)
Monocytes Absolute: 0.8 10*3/uL (ref 0.1–1.0)
Monocytes Relative: 12.8 % — ABNORMAL HIGH (ref 3.0–12.0)
Neutro Abs: 3.9 10*3/uL (ref 1.4–7.7)
Neutrophils Relative %: 64.4 % (ref 43.0–77.0)
Platelets: 282 10*3/uL (ref 150.0–400.0)
RBC: 4.22 Mil/uL (ref 3.87–5.11)
RDW: 15.8 % — ABNORMAL HIGH (ref 11.5–15.5)
WBC: 6 10*3/uL (ref 4.0–10.5)

## 2022-08-31 LAB — C-REACTIVE PROTEIN: CRP: 2.6 mg/dL (ref 0.5–20.0)

## 2022-08-31 LAB — SEDIMENTATION RATE: Sed Rate: 107 mm/hr — ABNORMAL HIGH (ref 0–30)

## 2022-08-31 NOTE — Patient Instructions (Addendum)
A few things to remember from today's visit:  Frequent falls - Plan: Ambulatory referral to Physical Therapy  Polymyalgia rheumatica ([place]), Chronic - Plan: C-reactive protein, Sedimentation rate  Left shoulder pain, unspecified chronicity - Plan: Ambulatory referral to Physical Therapy  Essential hypertension  Continue monitoring finger of left hand. If pain of ulcers please let us know.. Also mention it to your cardiologist and nephrologist. Try Voltaren gel on left shoulder. Hydromorphone is another option for pain management.  If you need refills for medications you take chronically, please call your pharmacy. Do not use My Chart to request refills or for acute issues that need immediate attention. If you send a my chart message, it may take a few days to be addressed, specially if I am not in the office.  Please be sure medication list is accurate. If a new problem present, please set up appointment sooner than planned today.

## 2022-08-31 NOTE — Telephone Encounter (Signed)
Left detailed message of providers recommendations. (DPR)

## 2022-08-31 NOTE — Assessment & Plan Note (Signed)
BP adequately controlled. Continue monitoring BP and low salt diet. No changes in current management.

## 2022-09-01 LAB — RPR: RPR Ser Ql: NONREACTIVE

## 2022-09-01 IMAGING — CR DG CHEST 2V
2 series · 2 of 2 positions shown · non-contrast
Comparison: 11/08/2021

CLINICAL DATA: Chest pain

EXAM:
CHEST - 2 VIEW

[chest pa]
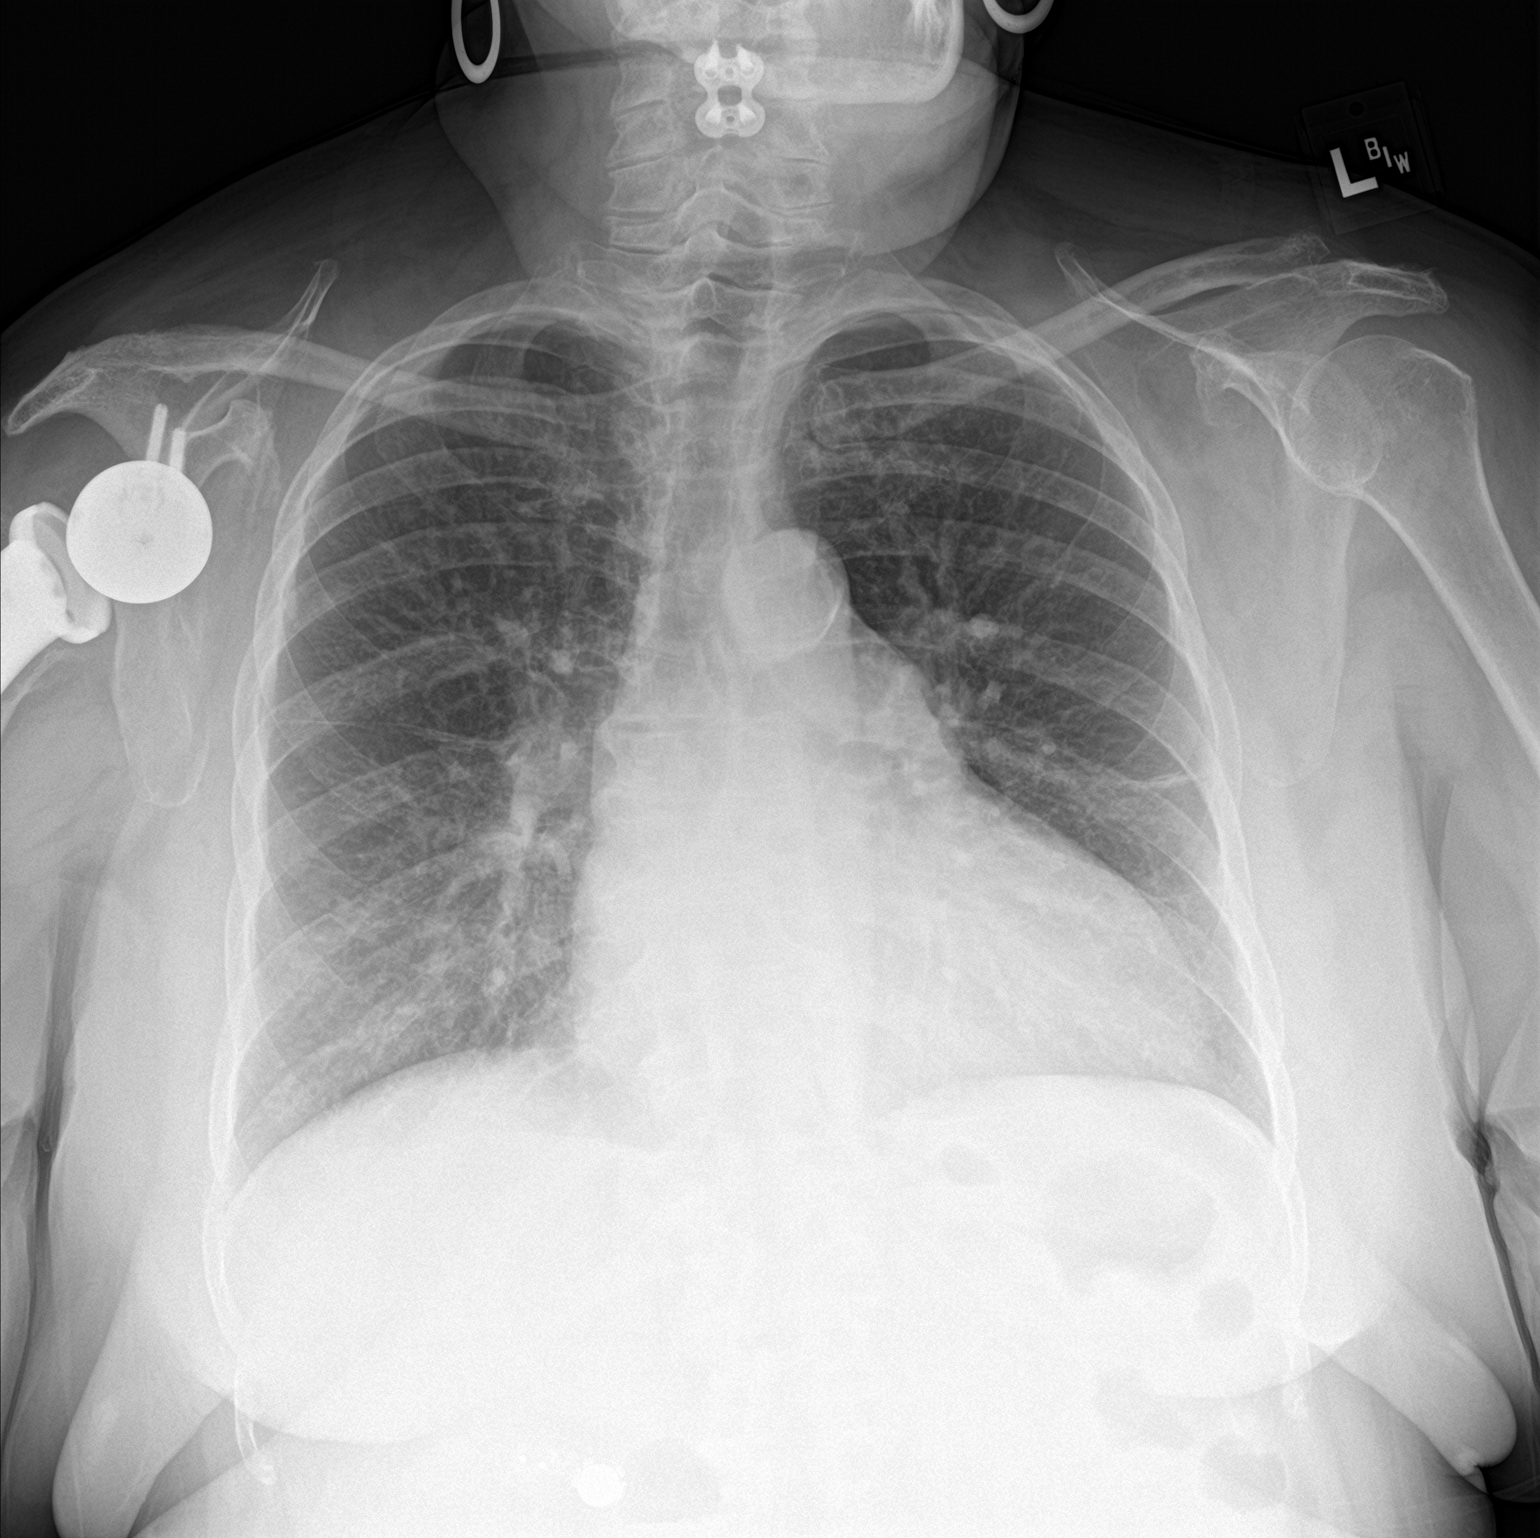

[chest lat]
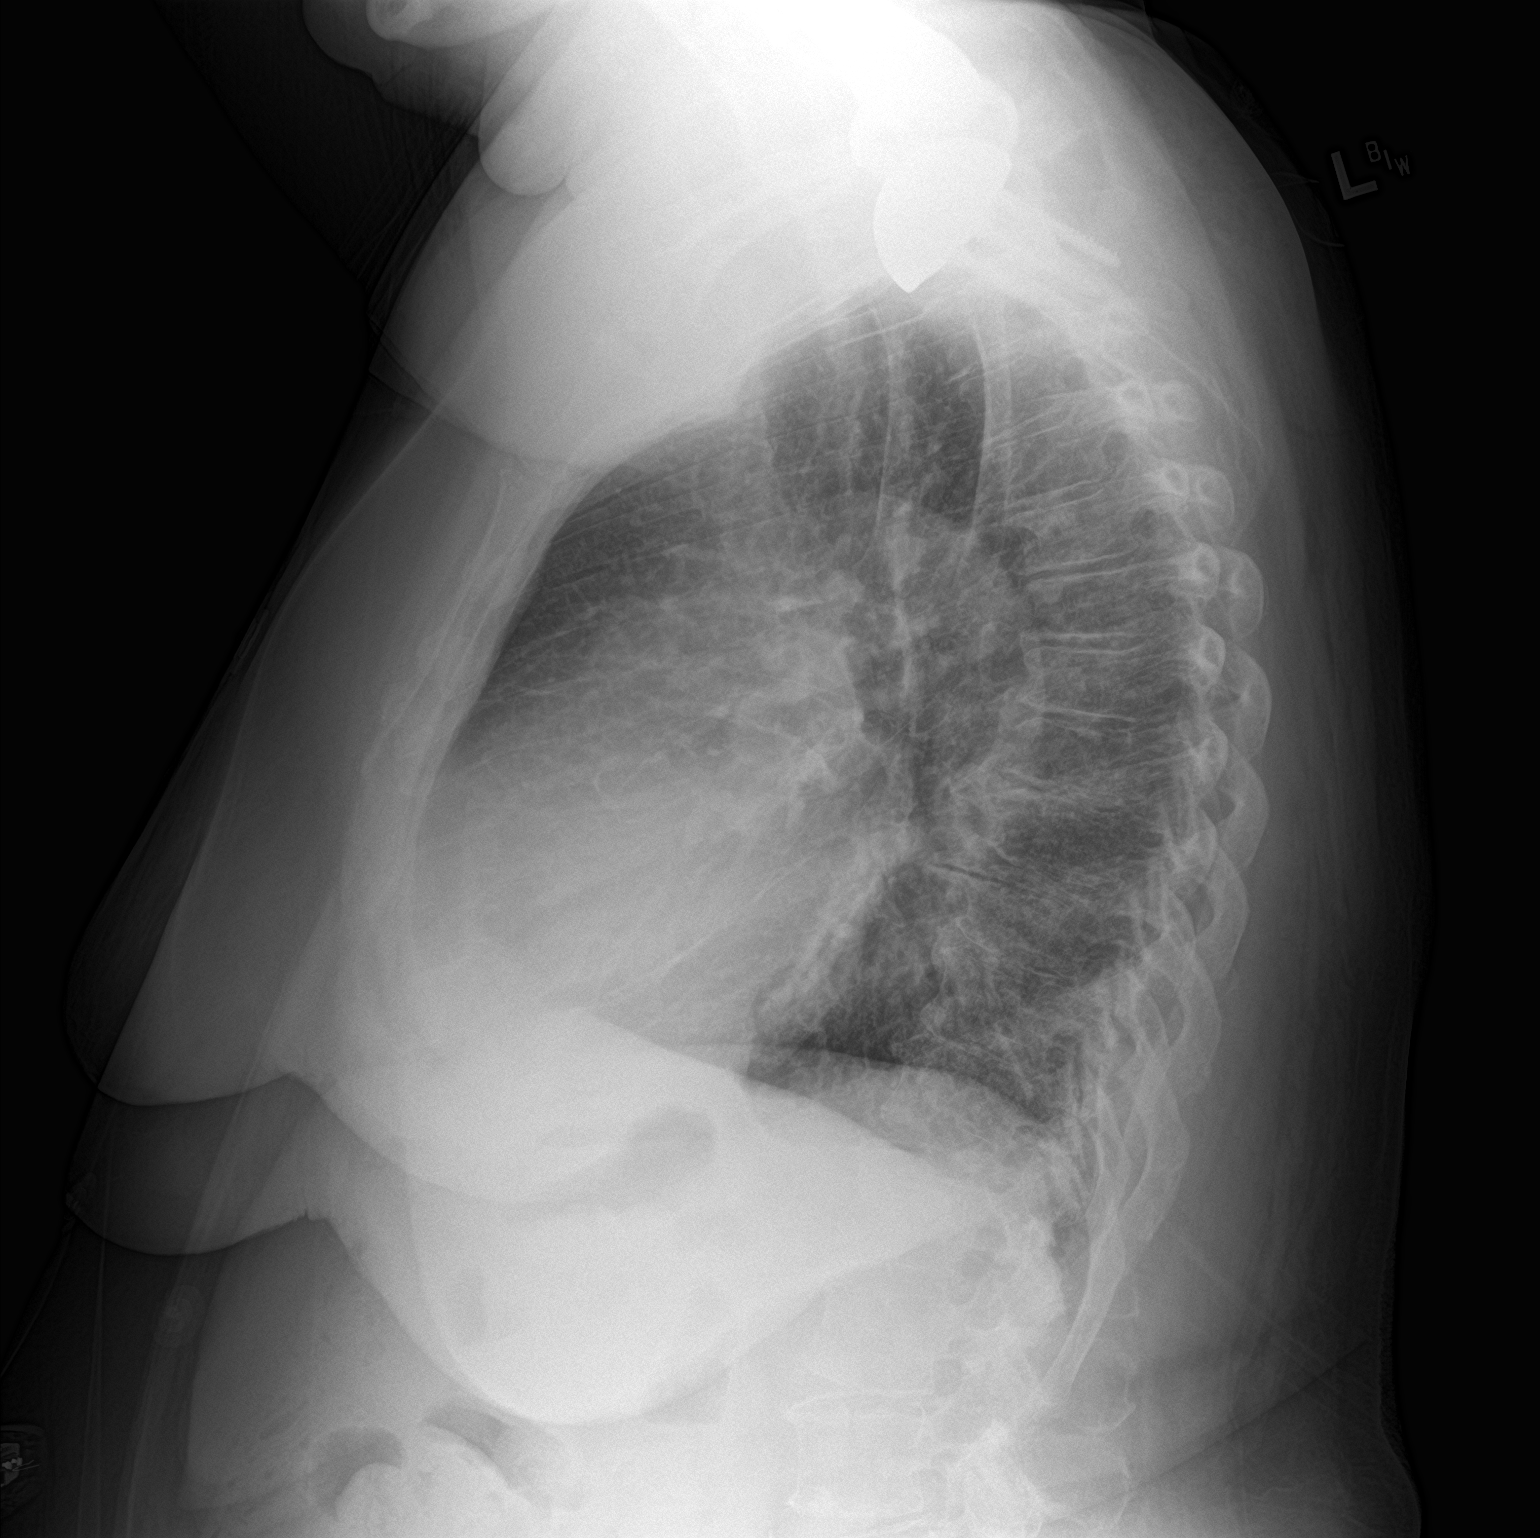

[2 of 2 positions shown; findings below may reference images not displayed]

FINDINGS: Bilateral mild interstitial thickening. No pleural effusion or
pneumothorax. Stable cardiomegaly. No acute osseous abnormality.
Right shoulder arthroplasty.
IMPRESSION: 1. Cardiomegaly with mild pulmonary vascular congestion.

## 2022-09-02 ENCOUNTER — Ambulatory Visit: Payer: Medicare Other | Attending: Cardiology

## 2022-09-02 DIAGNOSIS — Z7901 Long term (current) use of anticoagulants: Secondary | ICD-10-CM | POA: Diagnosis not present

## 2022-09-02 DIAGNOSIS — I4819 Other persistent atrial fibrillation: Secondary | ICD-10-CM | POA: Diagnosis not present

## 2022-09-02 LAB — POCT INR: INR: 2.7 (ref 2.0–3.0)

## 2022-09-02 NOTE — Patient Instructions (Signed)
Description   Continue taking warfarin 1/2 tablet daily. Repeat INR in 5 weeks; Anticoagulation Clinic [phone]

## 2022-09-06 ENCOUNTER — Telehealth: Payer: Self-pay | Admitting: Pharmacist

## 2022-09-06 NOTE — Chronic Care Management (AMB) (Signed)
Chronic Care Management Pharmacy Assistant   Name: Regina [name]  MRN: [id number] DOB: [birth year]  Reason for Encounter: Disease State and Medication Review / Diabetes and Hypertension Assessment and Medication Coordination Call  Recent office visits:  08/31/2022 Regina [name] MD - Patient was seen for left shoulder pain and additional concerns. Referral to physical therapy. Discontinued Meclizine. Follow up if symptoms worsen or fail to improve, for keep next appointment.   Recent consult visits:  None  Hospital visits:  Patient was seen at [facility] ED on 08/27/2022 due to Fall, acute pain of left shoulder.    New?Medications Started at [facility] Discharge:?? No medications started Medication Changes at Hospital Discharge: No medication changes Medications Discontinued at Hospital Discharge: No medications discontinued Medications that remain the same after Hospital Discharge:??  -All other medications will remain the same.    Medications: Outpatient Encounter Medications as of 09/06/2022  Medication Sig   acetaminophen (TYLENOL) 500 MG tablet Take 1,000 mg by mouth every 6 (six) hours as needed for mild pain.   Alcohol Swabs (B-D SINGLE USE SWABS REGULAR) PADS Use to test blood sugar up to 3 times daily   allopurinol (ZYLOPRIM) 100 MG tablet TAKE ONE TABLET BY MOUTH EVERY MORNING   aspirin EC 81 MG EC tablet Take 1 tablet (81 mg total) by mouth daily. Swallow whole.   atorvastatin (LIPITOR) 20 MG tablet TAKE ONE TABLET BY MOUTH EVERYDAY AT BEDTIME   Blood Glucose Monitoring Suppl (ACCU-CHEK GUIDE) w/Device KIT 1 Device by Does not apply route 3 (three) times daily.   Cholecalciferol (VITAMIN D3) 50 MCG (2000 UT) capsule Take 2,000 Units by mouth daily.   Continuous Blood Gluc Sensor (DEXCOM G6 SENSOR) MISC 1 Device by Does not apply route as directed.   Continuous Blood Gluc Transmit (DEXCOM G6 TRANSMITTER) MISC 1 Device by Does not apply route as directed.    famotidine (PEPCID) 20 MG tablet Take 1 tablet (20 mg total) by mouth at bedtime.   FARXIGA 10 MG TABS tablet TAKE ONE TABLET BY MOUTH ONCE DAILY   Fluocinolone Acetonide 0.01 % OIL instill one drop in ears every 2-3 DAYS AS NEEDED FOR pruritis   gabapentin (NEURONTIN) 600 MG tablet TAKE ONE TABLET BY MOUTH EVERY MORNING and TAKE ONE TABLET BY MOUTH EVERYDAY AT BEDTIME   glucose blood (ACCU-CHEK GUIDE) test strip Use to test blood sugar up to 3 times daily   insulin glargine, 1 Unit Dial, (TOUJEO SOLOSTAR) 300 UNIT/ML Solostar Pen Inject 22 Units into the skin daily. Eat a snack with protein nightly before bedtime.   insulin lispro (HUMALOG KWIKPEN) 100 UNIT/ML KwikPen Max daily 30 units   Insulin Pen Needle 32G X 4 MM MISC 1 Device by Does not apply route in the morning, at noon, in the evening, and at bedtime.   ipratropium (ATROVENT) 0.06 % nasal spray Place 2 sprays into both nostrils 4 (four) times daily. (Patient taking differently: Place 2 sprays into both nostrils 4 (four) times daily. As needed)   ketoconazole (NIZORAL) 2 % shampoo Apply 1 application. topically 2 (two) times a week.   Lancets Misc. (ACCU-CHEK FASTCLIX LANCET) KIT Use to test blood sugar up to 3 times daily   levothyroxine (SYNTHROID) 150 MCG tablet TAKE ONE TABLET BY MOUTH BEFORE BREAKFAST   magnesium oxide (MAG-OX) 400 MG tablet TAKE ONE TABLET BY MOUTH EVERY MORNING   metoprolol succinate (TOPROL-XL) 25 MG 24 hr tablet TAKE ONE TABLET BY MOUTH ONCE DAILY with OR immedaitely following  A meal   nitroGLYCERIN (NITROSTAT) 0.4 MG SL tablet Dissolve 1 tab under tongue as needed for chest pain. May repeat every 5 minutes x 2 doses. If no relief call 9-1-1.   pantoprazole (PROTONIX) 20 MG tablet TAKE ONE TABLET BY MOUTH ONCE DAILY   Polyethylene Glycol 3350 (MIRALAX PO) Take 1 Package by mouth daily as needed (constipation).   potassium chloride SA (KLOR-CON M20) 20 MEQ tablet Take 1 tablet (20 mEq total) by mouth daily.    Propylene Glycol (SYSTANE BALANCE OP) Place 1 drop into both eyes daily as needed (for dry eyes).   SALINE MIST SPRAY NA Place 2 sprays into the nose at bedtime.   Tafamidis 61 MG CAPS Take 61 mg by mouth daily.   torsemide (DEMADEX) 20 MG tablet Take 2 tablets (40 mg total) by mouth daily.   trolamine salicylate (ASPERCREME) 10 % cream Apply 1 application. topically as needed for muscle pain.   warfarin (COUMADIN) 5 MG tablet TAKE 1/2 TABLET BY MOUTH ONCE DAILY OR AS DIRECTED BY COUMADIN CLINIC   No facility-administered encounter medications on file as of 09/06/2022.    Lab Results  Component Value Date   HGBA1C 7.5 (A) 08/01/2022   Recent Office Vitals: BP Readings from Last 3 Encounters:  08/31/22 130/78  08/01/22 100/74  07/08/22 123/77   Pulse Readings from Last 3 Encounters:  08/31/22 100  08/01/22 80  07/08/22 91    Wt Readings from Last 3 Encounters:  08/31/22 224 lb (101.6 kg)  08/01/22 224 lb (101.6 kg)  07/08/22 226 lb 3.2 oz (102.6 kg)     Kidney Function Lab Results  Component Value Date/Time   CREATININE 2.2 (A) 05/17/2022 12:00 AM   CREATININE 2.18 (H) 01/12/2022 02:30 PM   CREATININE 2.19 (H) 01/04/2022 06:43 AM   GFR 24.47 (L) 08/13/2021 03:01 PM   GFRNONAA 23 (L) 01/12/2022 02:30 PM   GFRAA 26 10/20/2021 12:00 AM       Latest Ref Rng & Units 05/17/2022   12:00 AM 01/12/2022    2:30 PM 01/04/2022    6:43 AM  BMP  Glucose 70 - 99 mg/dL  161  143   BUN 4 - 21 46     55  50   Creatinine 0.5 - 1.1 2.2     2.18  2.19   Sodium 137 - 147 138     140  136   Potassium 3.5 - 5.1 mEq/L 4.1     3.9  3.1   Chloride 99 - 108 100     102  97   CO2 13 - _0 Calcium 8.7 - 10.7 9.2     9.1  9.2      This result is from an external source.   Recent Relevant Labs: Lab Results  Component Value Date/Time   HGBA1C 7.5 (A) 08/01/2022 01:34 PM   HGBA1C 7.3 (A) 01/31/2022 01:56 PM   HGBA1C 7.9 (H) 08/11/2021 08:15 AM   HGBA1C 7.2 (H) 06/18/2021 11:10  AM   MICROALBUR <0.7 08/13/2021 03:01 PM   MICROALBUR 1.9 05/22/2019 04:31 PM    Kidney Function Lab Results  Component Value Date/Time   CREATININE 2.2 (A) 05/17/2022 12:00 AM   CREATININE 2.18 (H) 01/12/2022 02:30 PM   CREATININE 2.19 (H) 01/04/2022 06:43 AM   GFR 24.47 (L) 08/13/2021 03:01 PM   GFRNONAA 23 (L) 01/12/2022 02:30 PM   GFRAA 26 10/20/2021 12:00 AM  Current antihyperglycemic regimen:  Farxiga 10 mg daily Toujeo Solostar 300 un/ml inject 22 units daily Humalog Kwikpan 100 un/ml inject max dose 30 units daily  Current antihypertensive regimen:  Metoprolol 25 mg daily  What recent interventions/DTPs have been made by any provider to improve Blood Pressure and Blood Glucose control since last CPP Visit: No recent interventions  Any recent hospitalizations or ED visits since last visit with CPP? Patient was seen at [facility] ED on 08/27/2022 due to Fall, acute pain of left shoulder.    How often are you checking your Blood Pressure?   Current home BP readings:   What diet changes have been made to improve Blood Pressure Control?  Patient follows Breakfast - patient Lunch - patient Dinner - patient  What exercise is being done to improve your Blood Pressure Control?    Patient  hypoglycemic symptoms, including   Patient  hyperglycemic symptoms, including   How often are you checking your blood sugar?   What are your blood sugars ranging?    During the week, how often does your blood glucose drop below 70?   Are you checking your feet daily/regularly?   Adherence Review: Is the patient currently on a STATIN medication? Yes Is the patient currently on ACE/ARB medication? No Does the patient have >5 day gap between last estimated fill dates? No  Patient obtains medications through Adherence Packaging  30 Days    Last adherence delivery included: Warfarin 5 mg tablet (easy top vials) use as directed Allopurinol 100 mg 1 tablet at  breakfast Klor-Con 20 meq - 1 tablet at breakfast Protonix 20 mg - 1 tablet at breakfast Aspirin 81 mg - 1 tablet at breakfast Magnesium oxide 400 mg 1 tablet at breakfast Cetirizine 10 mg 1 tablet at breakfast (OTC med) Atorvastatin 20 mg 1 tablet at bedtime Farxiga 10 mg 1 tablet at breakfast Metoprolol succinate 25 mg 1 tablet at breakfast Gabapentin 600 mg tablet  1 tablet at breakfast and 1 tablet at bedtime Levothyroxine 150 mcg 1 tablet before breakfast Torsemide 20 mg take 2 tablets at breakfast Pen needles 32 g X 4 mm Toujeo solostar 300 un - inject 22 units daily Humalog kwikpen 100 un - inject 30 units max dose daily   Patient declined (meds) last month: Dexcom not due til 10/15/2022 Nitrostat 0.43m - 1 tablet every 5 min as needed   Fluocinolone Acetonide 0.01 % OIL (ear drop) Ketoconazole (NIZORAL) 2 % shampoo   Patient is due for next adherence delivery on: 09/20/2022   Called patient and reviewed medications and coordinated delivery.   This delivery to include: Warfarin 5 mg tablet (easy top vials) use as directed Allopurinol 100 mg 1 tablet at breakfast Klor-Con 20 meq - 1 tablet at breakfast Protonix 20 mg - 1 tablet at breakfast Aspirin 81 mg - 1 tablet at breakfast Magnesium oxide 400 mg 1 tablet at breakfast Cetirizine 10 mg 1 tablet at breakfast (OTC med) Atorvastatin 20 mg 1 tablet at bedtime Farxiga 10 mg 1 tablet at breakfast Metoprolol succinate 25 mg 1 tablet at breakfast Gabapentin 600 mg tablet  1 tablet at breakfast and 1 tablet at bedtime Levothyroxine 150 mcg 1 tablet before breakfast Torsemide 20 mg take 2 tablets at breakfast Pen needles 32 g X 4 mm Toujeo solostar 300 un - inject 22 units daily Humalog kwikpen 100 un - inject 30 units max dose daily   Patient will need a short fill: No short fill needed   Coordinated  acute fill: No acute fill needed   Declined the following medications:  Dexcom not due til 10/15/2022  Does patient  need the following medications: Nitrostat 0.31m - 1 tablet every 5 min as needed   Fluocinolone Acetonide 0.01 % OIL (ear drop) Ketoconazole (NIZORAL) 2 % shampoo   Unable to confirmed delivery date of 09/20/2022 after multiple attempts.   Care Gaps: AWV - scheduled 02/20/2023 Last BP - 130/78 on 08/31/2022 Last A1C - 7.5 on 08/01/2022 Shingrix - overdue Covid - overdue  Star Rating Drugs: Atorvastatin 29m- last filled 08/17/2022 30 DS at [facility] Farxiga 1077m last [facility]20[id number]

## 2022-09-12 ENCOUNTER — Telehealth: Payer: Self-pay | Admitting: Cardiovascular Disease

## 2022-09-12 NOTE — Telephone Encounter (Signed)
Pt states she has to drop some patient assistance forms off to Dr. Audie Box for him to sign. She states she has already filled her part out. She would like to know what is the best day for her to drop them off to him

## 2022-09-12 NOTE — Telephone Encounter (Signed)
Returned call to patient- advised patient to go ahead and drop the forms off at her convenience. Patient states that she has mailed in her portion already and just needs Dr. Kathalene Frames portion sent by December 15th. Advised patient I would make his nurse aware. Patient verbalized understanding.

## 2022-09-13 ENCOUNTER — Other Ambulatory Visit: Payer: Self-pay | Admitting: Family Medicine

## 2022-09-13 ENCOUNTER — Telehealth: Payer: Self-pay | Admitting: *Deleted

## 2022-09-13 DIAGNOSIS — K219 Gastro-esophageal reflux disease without esophagitis: Secondary | ICD-10-CM

## 2022-09-13 NOTE — Patient Outreach (Signed)
  Care Coordination   09/13/2022 Name: ANAM BOBBY MRN: [id number] DOB: 30-Mar-1941   Care Coordination Outreach Attempts:  An unsuccessful telephone outreach was attempted today to offer the patient information about available care coordination services as a benefit of their health plan.   Follow Up Plan:  Additional outreach attempts will be made to offer the patient care coordination information and services.   Encounter Outcome:  No Answer   Care Coordination Interventions:  No, not indicated    Raina Mina, RN Care Management Coordinator [place] Office [phone]

## 2022-09-15 ENCOUNTER — Telehealth: Payer: Self-pay | Admitting: Pharmacist

## 2022-09-15 DIAGNOSIS — E1140 Type 2 diabetes mellitus with diabetic neuropathy, unspecified: Secondary | ICD-10-CM

## 2022-09-15 MED ORDER — ACCU-CHEK GUIDE VI STRP: ORAL_STRIP | 3 refills | Status: DC

## 2022-09-15 MED ORDER — ACCU-CHEK GUIDE W/DEVICE KIT: 1.0000 | PACK | Freq: Three times a day (TID) | 0 refills | Status: DC

## 2022-09-15 MED ORDER — ACCU-CHEK FASTCLIX LANCET KIT: PACK | 1 refills | Status: DC

## 2022-09-15 NOTE — Telephone Encounter (Signed)
Pharmacy contacted me to let me know that patient is needing a back up for using the Dexcom. Patient reports her sensor was not working properly last night and kept her up most of the night. She did replace it and it seems to be working and called Systems analyst support today as well. She no longer has a glucometer or any testing supplies at home since switching to the Dexcom. Will request prescriptions for all 3 (glucometer, test strips and lancets) from endocrinology.

## 2022-09-15 NOTE — Addendum Note (Signed)
Addended by: Cinda Quest on: 09/15/2022 02:41 PM   Modules accepted: Orders

## 2022-09-15 NOTE — Telephone Encounter (Signed)
I have forms- they are in his folder of paperwork to sign. I will have him sign and fax off once completed.

## 2022-09-16 NOTE — Progress Notes (Unsigned)
HPI: [name] is a 81 y.o. female with medical hx significant for CAD,CKD III,GERD,HTN, hypothyroidism, DM II, CHF, atrial fib on chronic anticoagulation (coumadin), and amyloidosi here today with one of her daughters for chronic disease management.  Last seen on 08/31/22 She reports that her shoulder has been doing well since the last visit, but experienced a sudden increase in pain last night after rolling over on it. She has been performing her own physical therapy and is considering formal therapy if the pain worsens Taking Tylenol and applying Voltaren gel. No edema or erythema.  She experienced lightheadedness for three days last week, which has since improved. The lightheadedness was not accompanied by a spinning sensation and was present even when lying in bed. She also reports swelling in her feet, which improves upon elevating her legs. Negative for CP,orthopnea,PND, or worsening exertional dyspnea.  HTN on Metoprolol succinate 25 mg daily. Negative for severe/frequent headache, visual changes,or focal weakness.  Lab Results  Component Value Date   CREATININE 2.2 (A) 05/17/2022   BUN 46 (A) 05/17/2022   NA 138 05/17/2022   K 4.1 05/17/2022   CL 100 05/17/2022   CO2 31 (A) 05/17/2022   She reports a long-standing issue with difficulty swallowing, particularly with solid foods. She often needs to drink water to help "push the food down." She does not have difficulty swallowing her medications. She denies experiencing heartburn or postnasal drainage. She is currently taking pantoprazole 40 mg daily. Negative for abdominal pain, nausea, vomiting, changes in bowel habits, blood in stool or melena.  Review of Systems  Constitutional:  Positive for fatigue. Negative for chills and fever.  Respiratory:  Negative for cough and wheezing.   Musculoskeletal:  Positive for arthralgias.  Neurological:  Negative for syncope and facial asymmetry.  See other pertinent  positives and negatives in HPI.  Current Outpatient Medications on File Prior to Visit  Medication Sig Dispense Refill   acetaminophen (TYLENOL) 500 MG tablet Take 1,000 mg by mouth every 6 (six) hours as needed for mild pain.     Alcohol Swabs (B-D SINGLE USE SWABS REGULAR) PADS Use to test blood sugar up to 3 times daily 100 each 3   allopurinol (ZYLOPRIM) 100 MG tablet TAKE ONE TABLET BY MOUTH EVERY MORNING 90 tablet 1   aspirin EC 81 MG EC tablet Take 1 tablet (81 mg total) by mouth daily. Swallow whole. 30 tablet 11   atorvastatin (LIPITOR) 20 MG tablet TAKE ONE TABLET BY MOUTH EVERYDAY AT BEDTIME 90 tablet 2   Blood Glucose Monitoring Suppl (ACCU-CHEK GUIDE) w/Device KIT 1 Device by Does not apply route 3 (three) times daily. 1 kit 0   Cholecalciferol (VITAMIN D3) 50 MCG (2000 UT) capsule Take 2,000 Units by mouth daily.     Continuous Blood Gluc Sensor (DEXCOM G6 SENSOR) MISC 1 Device by Does not apply route as directed. 9 each 3   Continuous Blood Gluc Transmit (DEXCOM G6 TRANSMITTER) MISC 1 Device by Does not apply route as directed. 1 each 3   famotidine (PEPCID) 20 MG tablet Take 1 tablet (20 mg total) by mouth at bedtime. 90 tablet 1   FARXIGA 10 MG TABS tablet TAKE ONE TABLET BY MOUTH ONCE DAILY 90 tablet 3   Fluocinolone Acetonide 0.01 % OIL instill one drop in ears every 2-3 DAYS AS NEEDED FOR pruritis 20 mL 1   gabapentin (NEURONTIN) 600 MG tablet TAKE ONE TABLET BY MOUTH EVERY MORNING and TAKE ONE TABLET BY MOUTH  EVERYDAY AT BEDTIME 60 tablet 3   glucose blood (ACCU-CHEK GUIDE) test strip Use to test blood sugar up to 3 times daily 100 each 3   insulin glargine, 1 Unit Dial, (TOUJEO SOLOSTAR) 300 UNIT/ML Solostar Pen Inject 22 Units into the skin daily. Eat a snack with protein nightly before bedtime. 15 mL 6   insulin lispro (HUMALOG KWIKPEN) 100 UNIT/ML KwikPen Max daily 30 units 30 mL 3   Insulin Pen Needle 32G X 4 MM MISC 1 Device by Does not apply route in the morning, at  noon, in the evening, and at bedtime. 400 each 3   ipratropium (ATROVENT) 0.06 % nasal spray Place 2 sprays into both nostrils 4 (four) times daily. (Patient taking differently: Place 2 sprays into both nostrils 4 (four) times daily. As needed) 15 mL 3   ketoconazole (NIZORAL) 2 % shampoo Apply 1 application. topically 2 (two) times a week. 120 mL 2   Lancets Misc. (ACCU-CHEK FASTCLIX LANCET) KIT Use to test blood sugar up to 3 times daily 1 kit 1   levothyroxine (SYNTHROID) 150 MCG tablet TAKE ONE TABLET BY MOUTH BEFORE BREAKFAST 90 tablet 1   magnesium oxide (MAG-OX) 400 MG tablet TAKE ONE TABLET BY MOUTH EVERY MORNING 90 tablet 1   metoprolol succinate (TOPROL-XL) 25 MG 24 hr tablet TAKE ONE TABLET BY MOUTH ONCE DAILY with OR immedaitely following A meal 90 tablet 3   nitroGLYCERIN (NITROSTAT) 0.4 MG SL tablet Dissolve 1 tab under tongue as needed for chest pain. May repeat every 5 minutes x 2 doses. If no relief call 9-1-1. 25 tablet 2   pantoprazole (PROTONIX) 20 MG tablet TAKE ONE TABLET BY MOUTH ONCE DAILY 30 tablet 2   Polyethylene Glycol 3350 (MIRALAX PO) Take 1 Package by mouth daily as needed (constipation).     Propylene Glycol (SYSTANE BALANCE OP) Place 1 drop into both eyes daily as needed (for dry eyes).     SALINE MIST SPRAY NA Place 2 sprays into the nose at bedtime.     Tafamidis 61 MG CAPS Take 61 mg by mouth daily. 30 capsule 12   torsemide (DEMADEX) 20 MG tablet Take 2 tablets (40 mg total) by mouth daily. 180 tablet 1   trolamine salicylate (ASPERCREME) 10 % cream Apply 1 application. topically as needed for muscle pain.     warfarin (COUMADIN) 5 MG tablet TAKE 1/2 TABLET BY MOUTH ONCE DAILY OR AS DIRECTED BY COUMADIN CLINIC 50 tablet 1   potassium chloride SA (KLOR-CON M20) 20 MEQ tablet Take 1 tablet (20 mEq total) by mouth daily. 90 tablet 3   No current facility-administered medications on file prior to visit.   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 01/2015 Cardiolite: + inf wall ischemia, EF 56%;  b. 01/2015 Cath: LM nl, LAD 64m LCX min irregs, RCA dominant, 50-685m  Sleep apnea    Swallowing difficulty    Allergies  Allergen Reactions   Ace Inhibitors Other (See Comments)    unknown   Amlodipine  Other (See Comments)    unknown   Atenolol Other (See Comments)    bradycardia   Avandia [Rosiglitazone] Other (See Comments)    unknown   Darvon [Propoxyphene] Other (See Comments)    unknown   Erythromycin Itching   Hydralazine Other (See Comments)    Burning in throat and chest   Hydrocodone Other (See Comments)    Hallucinations.   Levofloxacin Itching   Morphine And Related Other (See Comments)    Dizzy and hallucianation, vomiting; Willing to try low dose   Percocet [Oxycodone-Acetaminophen] Other (See Comments)    hallucination   Spironolactone Other (See Comments)    unknown   Tramadol Other (See Comments)    Unknown/does not recall reaction but does not want to take again   Social History   Socioeconomic History   Marital status: Widowed    Spouse name: Not on file   Number of children: Not on file   Years of education: Not on file   Highest education level: Not on file  Occupational History   Occupation: Retired  Tobacco Use   Smoking status: Never   Smokeless tobacco: Never  Vaping Use   Vaping Use: Never used  Substance and Sexual Activity   Alcohol use: No   Drug use: Never   Sexual activity: Not Currently  Other Topics Concern   Not on file  Social History Narrative   Lives with daughter   Social Determinants of Health   Financial  Resource Strain: [place]  (02/16/2022)   Overall Financial Resource Strain (CARDIA)    Difficulty of Paying Living Expenses: Not hard at all  Food Insecurity: No [place] (02/16/2022)   Hunger Vital Sign    Worried About Running Out of Food in the Last Year: Never true    [place] in the Last Year: Never true  Transportation Needs: No Transportation Needs (02/16/2022)   PRAPARE - Hydrologist (Medical): No    Lack of Transportation (Non-Medical): No  Physical Activity: Inactive (02/16/2022)   Exercise Vital Sign    Days of Exercise per Week: 0 days    Minutes of Exercise per Session: 0 min  Stress: No Stress Concern Present (02/16/2022)   [place]    Feeling of Stress : Not at all  Social Connections: Moderately Integrated (02/16/2022)   Social Connection and Isolation Panel [NHANES]    Frequency of Communication with Friends and Family: More than three times a week    Frequency of Social Gatherings with Friends and Family: More than three times a week    Attends Religious Services: More than 4 times per year    Active Member of [employer] or Organizations: Yes    Attends Archivist Meetings: More than 4 times per year    Marital Status: Widowed   Vitals:   09/19/22 1048  BP: 120/80  Pulse: 86  Resp: 16  SpO2: 97%   Body mass index is 41.59 kg/m.  Physical Exam Vitals and nursing note reviewed.  Constitutional:      General: She is not in acute distress.    Appearance: She is well-developed.  HENT:     Head: Normocephalic and atraumatic.     Mouth/Throat:     Mouth: Mucous membranes are moist.     Pharynx: Oropharynx is clear.  Eyes:     Conjunctiva/sclera: Conjunctivae normal.  Cardiovascular:     Rate and  Rhythm: Normal rate. Rhythm irregular.     Heart sounds: No murmur heard. Pulmonary:     Effort: Pulmonary effort is normal. No respiratory distress.      Breath sounds: Normal breath sounds.  Chest:     Chest wall: No deformity or crepitus.  Abdominal:     Palpations: Abdomen is soft. There is no mass.     Tenderness: There is no abdominal tenderness.  Musculoskeletal:     Right lower leg: 1+ Pitting Edema present.     Left lower leg: 1+ Pitting Edema present.     Comments: Left shoulder with no significant limitation of ROM.  Skin:    General: Skin is warm.     Findings: No erythema or rash.  Neurological:     General: No focal deficit present.     Mental Status: She is alert and oriented to person, place, and time.     Cranial Nerves: No cranial nerve deficit.     Gait: Gait normal.  Psychiatric:        Mood and Affect: Mood and affect normal.   ASSESSMENT AND PLAN:  Ms.Saveah was seen today for follow-up.  Diagnoses and all orders for this visit: Orders Placed This Encounter  Procedures   SLP modified barium swallow   Essential hypertension BP adequately controlled. Continue Metoprolol succinate 25 mg daily and low salt diet. Monitor BP regularly.  Left shoulder pain Problem has improved overall. She is not interested in starting PT, she would like to continue ROM exercises. She will let me know if she decides to start outpt PT. Continue Tylenol and topical Voltaren.  Dizziness, nonspecific It has improved. We discussed possible etiologies, some of her comorbilities and medications can be contributing factors. I do not think further work up is needed at this time. Monitor for new symptoms. Fall precautions discussed.  Dysphagia Chronic. We discussed possible etiologies. Continue Pantoprazole 40 mg daily and GERD precautions. We discussed options, including GI evaluation to discuss possible EGD or swallowing study, she is not interested in invasive procedures, agrees with the latter one.  Return in about 6 months (around 03/21/2023) for chronic problems.  Ernesto Zukowski G. [name], MD  [facility]. [place]  office.

## 2022-09-19 ENCOUNTER — Encounter: Payer: Self-pay | Admitting: Family Medicine

## 2022-09-19 ENCOUNTER — Ambulatory Visit (INDEPENDENT_AMBULATORY_CARE_PROVIDER_SITE_OTHER): Payer: Medicare Other | Admitting: Family Medicine

## 2022-09-19 VITALS — BP 120/80 | HR 86 | Resp 16 | Ht 62.0 in | Wt 227.4 lb

## 2022-09-19 DIAGNOSIS — M25512 Pain in left shoulder: Secondary | ICD-10-CM

## 2022-09-19 DIAGNOSIS — R42 Dizziness and giddiness: Secondary | ICD-10-CM

## 2022-09-19 DIAGNOSIS — I10 Essential (primary) hypertension: Secondary | ICD-10-CM

## 2022-09-19 DIAGNOSIS — R1310 Dysphagia, unspecified: Secondary | ICD-10-CM

## 2022-09-19 NOTE — Patient Instructions (Signed)
A few things to remember from today's visit:  Dysphagia, unspecified type - Plan: DG SWALLOW STUDY OP MEDICARE SPEECH PATH  Dizziness, nonspecific  Essential hypertension  Left shoulder pain, unspecified chronicity  Continue PT exercises at home. Fall precautions. No changes today.  If you need refills for medications you take chronically, please call your pharmacy. Do not use My Chart to request refills or for acute issues that need immediate attention. If you send a my chart message, it may take a few days to be addressed, specially if I am not in the office.  Please be sure medication list is accurate. If a new problem present, please set up appointment sooner than planned today.

## 2022-09-20 ENCOUNTER — Telehealth: Payer: Self-pay | Admitting: Family Medicine

## 2022-09-20 ENCOUNTER — Other Ambulatory Visit (HOSPITAL_COMMUNITY): Payer: Self-pay

## 2022-09-20 DIAGNOSIS — R1310 Dysphagia, unspecified: Secondary | ICD-10-CM

## 2022-09-20 NOTE — Telephone Encounter (Signed)
Order needed to be changed, order has been changed.

## 2022-09-20 NOTE — Telephone Encounter (Signed)
Sent message via secure chat.

## 2022-09-20 NOTE — Telephone Encounter (Signed)
Derrick Ravel with [facility]  RE: Ricka Burdock Test  [phone] [facility])  Please return call as soon as you can.   If it is easier, Jarrett Soho suggests MD contact her via [place].

## 2022-09-22 ENCOUNTER — Encounter: Payer: Self-pay | Admitting: Family Medicine

## 2022-09-22 DIAGNOSIS — M25512 Pain in left shoulder: Secondary | ICD-10-CM | POA: Insufficient documentation

## 2022-09-22 DIAGNOSIS — R42 Dizziness and giddiness: Secondary | ICD-10-CM | POA: Insufficient documentation

## 2022-09-22 DIAGNOSIS — R1310 Dysphagia, unspecified: Secondary | ICD-10-CM | POA: Insufficient documentation

## 2022-09-22 NOTE — Assessment & Plan Note (Signed)
Problem has improved overall. She is not interested in starting PT, she would like to continue ROM exercises. She will let me know if she decides to start outpt PT. Continue Tylenol and topical Voltaren.

## 2022-09-22 NOTE — Assessment & Plan Note (Signed)
BP adequately controlled. Continue Metoprolol succinate 25 mg daily and low salt diet. Monitor BP regularly.

## 2022-09-22 NOTE — Assessment & Plan Note (Signed)
Chronic. We discussed possible etiologies. Continue Pantoprazole 40 mg daily and GERD precautions. We discussed options, including GI evaluation to discuss possible EGD or swallowing study, she is not interested in invasive procedures, agrees with the latter one.

## 2022-09-22 NOTE — Assessment & Plan Note (Signed)
It has improved. We discussed possible etiologies, some of her comorbilities and medications can be contributing factors. I do not think further work up is needed at this time. Monitor for new symptoms. Fall precautions discussed.

## 2022-09-27 ENCOUNTER — Ambulatory Visit (HOSPITAL_COMMUNITY)
Admission: RE | Admit: 2022-09-27 | Discharge: 2022-09-27 | Disposition: A | Discharge status: Home / Self Care | Payer: Medicare Other | Source: Ambulatory Visit | Attending: Family Medicine | Admitting: Family Medicine

## 2022-09-27 DIAGNOSIS — R1310 Dysphagia, unspecified: Secondary | ICD-10-CM

## 2022-09-27 DIAGNOSIS — R1312 Dysphagia, oropharyngeal phase: Secondary | ICD-10-CM | POA: Diagnosis not present

## 2022-09-27 NOTE — Therapy (Signed)
Modified Barium Swallow Progress Note  Patient Details  Name: Regina [name] MRN: [id number] Date of Birth: 1940-11-01  Today's Date: 09/27/2022  Modified Barium Swallow completed.  Full report located under Chart Review in the Imaging Section.  Brief recommendations include the following:  Clinical Impression  Patient presents with a mild oropharyngeal dysphagia as per this MBS. During oral phase, she exhibited decreased oral containment with liquids, leading to premature spillage into vallecular sinus. She exhibited poor bolus cohesion when taking 70m barium tablet with thin liquid barium. During pharyngeal phase of swallow, patient exhibit delayed swallow initiation when taking tablet with thin liquids but otherwise, no other significant pharyngeal phase deficits. Cervical esophageal phase of swallow appeared [facility] observed questionable decreased motilty of barium contrast in distal esophagus but no observed barium stasis. No penetration or aspiration observed with any of the tested barium consistencies and no pharyngeal residuals post swallow.   Swallow Evaluation Recommendations   Recommended Consults: Consider GI evaluation   SLP Diet Recommendations: Regular solids;Thin liquid   Liquid Administration via: Cup;Straw   Medication Administration: Whole meds with puree   Supervision: Patient able to self feed   Compensations: Follow solids with liquid   Postural Changes: Seated upright at 90 degrees          JSonia Baller MA, CCC-SLP Speech Therapy

## 2022-10-03 NOTE — Telephone Encounter (Signed)
I got everything signed and faxed to [facility] this morning

## 2022-10-03 NOTE — Progress Notes (Unsigned)
ACUTE VISIT No chief complaint on file.  HPI: [name] is a 81 y.o. female, who is here today complaining of *** HPI  Review of Systems See other pertinent positives and negatives in HPI.  Current Outpatient Medications on File Prior to Visit  Medication Sig Dispense Refill   acetaminophen (TYLENOL) 500 MG tablet Take 1,000 mg by mouth every 6 (six) hours as needed for mild pain.     Alcohol Swabs (B-D SINGLE USE SWABS REGULAR) PADS Use to test blood sugar up to 3 times daily 100 each 3   allopurinol (ZYLOPRIM) 100 MG tablet TAKE ONE TABLET BY MOUTH EVERY MORNING 90 tablet 1   aspirin EC 81 MG EC tablet Take 1 tablet (81 mg total) by mouth daily. Swallow whole. 30 tablet 11   atorvastatin (LIPITOR) 20 MG tablet TAKE ONE TABLET BY MOUTH EVERYDAY AT BEDTIME 90 tablet 2   Blood Glucose Monitoring Suppl (ACCU-CHEK GUIDE) w/Device KIT 1 Device by Does not apply route 3 (three) times daily. 1 kit 0   Cholecalciferol (VITAMIN D3) 50 MCG (2000 UT) capsule Take 2,000 Units by mouth daily.     Continuous Blood Gluc Sensor (DEXCOM G6 SENSOR) MISC 1 Device by Does not apply route as directed. 9 each 3   Continuous Blood Gluc Transmit (DEXCOM G6 TRANSMITTER) MISC 1 Device by Does not apply route as directed. 1 each 3   famotidine (PEPCID) 20 MG tablet Take 1 tablet (20 mg total) by mouth at bedtime. 90 tablet 1   FARXIGA 10 MG TABS tablet TAKE ONE TABLET BY MOUTH ONCE DAILY 90 tablet 3   Fluocinolone Acetonide 0.01 % OIL instill one drop in ears every 2-3 DAYS AS NEEDED FOR pruritis 20 mL 1   gabapentin (NEURONTIN) 600 MG tablet TAKE ONE TABLET BY MOUTH EVERY MORNING and TAKE ONE TABLET BY MOUTH EVERYDAY AT BEDTIME 60 tablet 3   glucose blood (ACCU-CHEK GUIDE) test strip Use to test blood sugar up to 3 times daily 100 each 3   insulin glargine, 1 Unit Dial, (TOUJEO SOLOSTAR) 300 UNIT/ML Solostar Pen Inject 22 Units into the skin daily. Eat a snack with protein nightly before bedtime. 15  mL 6   insulin lispro (HUMALOG KWIKPEN) 100 UNIT/ML KwikPen Max daily 30 units 30 mL 3   Insulin Pen Needle 32G X 4 MM MISC 1 Device by Does not apply route in the morning, at noon, in the evening, and at bedtime. 400 each 3   ipratropium (ATROVENT) 0.06 % nasal spray Place 2 sprays into both nostrils 4 (four) times daily. (Patient taking differently: Place 2 sprays into both nostrils 4 (four) times daily. As needed) 15 mL 3   ketoconazole (NIZORAL) 2 % shampoo Apply 1 application. topically 2 (two) times a week. 120 mL 2   Lancets Misc. (ACCU-CHEK FASTCLIX LANCET) KIT Use to test blood sugar up to 3 times daily 1 kit 1   levothyroxine (SYNTHROID) 150 MCG tablet TAKE ONE TABLET BY MOUTH BEFORE BREAKFAST 90 tablet 1   magnesium oxide (MAG-OX) 400 MG tablet TAKE ONE TABLET BY MOUTH EVERY MORNING 90 tablet 1   metoprolol succinate (TOPROL-XL) 25 MG 24 hr tablet TAKE ONE TABLET BY MOUTH ONCE DAILY with OR immedaitely following A meal 90 tablet 3   nitroGLYCERIN (NITROSTAT) 0.4 MG SL tablet Dissolve 1 tab under tongue as needed for chest pain. May repeat every 5 minutes x 2 doses. If no relief call 9-1-1. 25 tablet 2   pantoprazole (  PROTONIX) 20 MG tablet TAKE ONE TABLET BY MOUTH ONCE DAILY 30 tablet 2   Polyethylene Glycol 3350 (MIRALAX PO) Take 1 Package by mouth daily as needed (constipation).     potassium chloride SA (KLOR-CON M20) 20 MEQ tablet Take 1 tablet (20 mEq total) by mouth daily. 90 tablet 3   Propylene Glycol (SYSTANE BALANCE OP) Place 1 drop into both eyes daily as needed (for dry eyes).     SALINE MIST SPRAY NA Place 2 sprays into the nose at bedtime.     Tafamidis 61 MG CAPS Take 61 mg by mouth daily. 30 capsule 12   torsemide (DEMADEX) 20 MG tablet Take 2 tablets (40 mg total) by mouth daily. 180 tablet 1   trolamine salicylate (ASPERCREME) 10 % cream Apply 1 application. topically as needed for muscle pain.     warfarin (COUMADIN) 5 MG tablet TAKE 1/2 TABLET BY MOUTH ONCE DAILY OR  AS DIRECTED BY COUMADIN CLINIC 50 tablet 1   No current facility-administered medications on file prior to visit.    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 01/2015 Cardiolite: + inf wall ischemia, EF 56%;  b. 01/2015 Cath: LM nl, LAD 53m LCX min irregs, RCA dominant, 50-669m  Sleep apnea    Swallowing difficulty    Allergies  Allergen Reactions   Ace Inhibitors Other (See Comments)    unknown   Amlodipine Other (See Comments)    unknown   Atenolol Other (See Comments)    bradycardia   Avandia [Rosiglitazone] Other (See Comments)    unknown   Darvon [Propoxyphene] Other (See Comments)    unknown   Erythromycin Itching   Hydralazine Other (See Comments)    Burning in throat and chest   Hydrocodone Other (See Comments)    Hallucinations.   Levofloxacin Itching   Morphine And Related Other (See Comments)    Dizzy and hallucianation, vomiting; Willing to try low dose   Percocet [Oxycodone-Acetaminophen] Other (See Comments)    hallucination   Spironolactone Other (See Comments)    unknown   Tramadol Other (See Comments)    Unknown/does not recall reaction but does not want to take again    Social History   Socioeconomic History   Marital status: Widowed    Spouse name: Not on file   Number of children: Not on file   Years of education: Not on file   Highest education level: Not on file  Occupational History   Occupation: Retired  Tobacco Use   Smoking status: Never   Smokeless tobacco: Never  Vaping Use   Vaping Use: Never used  Substance and Sexual Activity   Alcohol use: No    Drug use: Never   Sexual activity: Not Currently  Other Topics Concern   Not on file  Social History Narrative   Lives with daughter   Social Determinants of Health   Financial Resource Strain: [employer](02/16/2022)   Overall Financial Resource Strain (CARDIA)    Difficulty of Paying Living Expenses: Not hard  at all  Food Insecurity: No Food Insecurity (02/16/2022)   Hunger Vital Sign    Worried About Running Out of Food in the Last Year: Never true    Ran Out of Food in the Last Year: Never true  Transportation Needs: No Transportation Needs (02/16/2022)   PRAPARE - Hydrologist (Medical): No    Lack of Transportation (Non-Medical): No  Physical Activity: Inactive (02/16/2022)   Exercise Vital Sign    Days of Exercise per Week: 0 days    Minutes of Exercise per Session: 0 min  Stress: No Stress Concern Present (02/16/2022)   [place]    Feeling of Stress : Not at all  Social Connections: Moderately Integrated (02/16/2022)   Social Connection and Isolation Panel [NHANES]    Frequency of Communication with Friends and Family: More than three times a week    Frequency of Social Gatherings with Friends and Family: More than three times a week    Attends Religious Services: More than 4 times per year    Active Member of [employer] or Organizations: Yes    Attends Archivist Meetings: More than 4 times per year    Marital Status: Widowed    There were no vitals filed for this visit. There is no height or weight on file to calculate BMI.  Physical Exam  ASSESSMENT AND PLAN: There are no diagnoses linked to this encounter.  No follow-ups on file.  Torrion Witter G. [name], MD  [facility]. [place] office.  Discharge Instructions   None

## 2022-10-03 NOTE — Telephone Encounter (Signed)
Noted. Thanks.

## 2022-10-03 NOTE — Telephone Encounter (Signed)
Amisha with [facility] is following up, stating that they have not received patient assistance forms. She states that a call back is not necessary if someone can just fax the forms to them when able.  Phone#: [phone] Fax#: [phone]

## 2022-10-04 ENCOUNTER — Ambulatory Visit (INDEPENDENT_AMBULATORY_CARE_PROVIDER_SITE_OTHER): Payer: Medicare Other

## 2022-10-04 ENCOUNTER — Encounter: Payer: Self-pay | Admitting: Family Medicine

## 2022-10-04 ENCOUNTER — Ambulatory Visit (INDEPENDENT_AMBULATORY_CARE_PROVIDER_SITE_OTHER): Payer: Medicare Other | Admitting: Family Medicine

## 2022-10-04 VITALS — BP 124/78 | HR 95 | Resp 15 | Ht 62.0 in | Wt 223.5 lb

## 2022-10-04 DIAGNOSIS — D239 Other benign neoplasm of skin, unspecified: Secondary | ICD-10-CM | POA: Diagnosis not present

## 2022-10-04 DIAGNOSIS — G8929 Other chronic pain: Secondary | ICD-10-CM

## 2022-10-04 DIAGNOSIS — D179 Benign lipomatous neoplasm, unspecified: Secondary | ICD-10-CM

## 2022-10-04 DIAGNOSIS — M25512 Pain in left shoulder: Secondary | ICD-10-CM

## 2022-10-04 NOTE — Patient Instructions (Addendum)
A few things to remember from today's visit:  Multiple lipomas  Chronic left shoulder pain - Plan: DG Shoulder Left, AMB referral to orthopedics  Dermatofibroma  Skin lesions do not seem to be worrisome,benign lesions. Possible dermatofibromas. Massed in left arm seem lipomas. If you decide about having them remove we can arrange appt with surgeon. Monitor for associated symptoms.  If you need refills for medications you take chronically, please call your pharmacy. Do not use My Chart to request refills or for acute issues that need immediate attention. If you send a my chart message, it may take a few days to be addressed, specially if I am not in the office.  Please be sure medication list is accurate. If a new problem present, please set up appointment sooner than planned today.

## 2022-10-06 ENCOUNTER — Other Ambulatory Visit: Payer: Self-pay | Admitting: Family Medicine

## 2022-10-06 ENCOUNTER — Telehealth: Payer: Self-pay | Admitting: Pharmacist

## 2022-10-06 NOTE — Chronic Care Management (AMB) (Signed)
Chronic Care Management Pharmacy Assistant   Name: Regina [name]  MRN: [id number] DOB: 11-Dec-1940  Reason for Encounter: Medication Review / Medication Coordination Call   Recent office visits:  10/04/2022 [name] MD - Patient was seen for Chronic left shoulder pain and additional concerns. No medication changes. Follow up if symptoms worsen or fail to improve.  09/19/2022 [name] MD - Patient was seen for dysphagia and additional concerns. No medication changes. Follow up in 6 months for chronic problems.   Recent consult visits:  None  Hospital visits:  None  Medications: Outpatient Encounter Medications as of 10/06/2022  Medication Sig   acetaminophen (TYLENOL) 500 MG tablet Take 1,000 mg by mouth every 6 (six) hours as needed for mild pain.   Alcohol Swabs (B-D SINGLE USE SWABS REGULAR) PADS Use to test blood sugar up to 3 times daily   allopurinol (ZYLOPRIM) 100 MG tablet TAKE ONE TABLET BY MOUTH EVERY MORNING   aspirin EC 81 MG EC tablet Take 1 tablet (81 mg total) by mouth daily. Swallow whole.   atorvastatin (LIPITOR) 20 MG tablet TAKE ONE TABLET BY MOUTH EVERYDAY AT BEDTIME   Blood Glucose Monitoring Suppl (ACCU-CHEK GUIDE) w/Device KIT 1 Device by Does not apply route 3 (three) times daily.   Cholecalciferol (VITAMIN D3) 50 MCG (2000 UT) capsule Take 2,000 Units by mouth daily.   Continuous Blood Gluc Sensor (DEXCOM G6 SENSOR) MISC 1 Device by Does not apply route as directed.   Continuous Blood Gluc Transmit (DEXCOM G6 TRANSMITTER) MISC 1 Device by Does not apply route as directed.   famotidine (PEPCID) 20 MG tablet Take 1 tablet (20 mg total) by mouth at bedtime.   FARXIGA 10 MG TABS tablet TAKE ONE TABLET BY MOUTH ONCE DAILY   Fluocinolone Acetonide 0.01 % OIL instill one drop in ears every 2-3 DAYS AS NEEDED FOR pruritis   gabapentin (NEURONTIN) 600 MG tablet TAKE ONE TABLET BY MOUTH EVERY MORNING and TAKE ONE TABLET BY MOUTH EVERYDAY AT BEDTIME    glucose blood (ACCU-CHEK GUIDE) test strip Use to test blood sugar up to 3 times daily   insulin glargine, 1 Unit Dial, (TOUJEO SOLOSTAR) 300 UNIT/ML Solostar Pen Inject 22 Units into the skin daily. Eat a snack with protein nightly before bedtime.   insulin lispro (HUMALOG KWIKPEN) 100 UNIT/ML KwikPen Max daily 30 units   Insulin Pen Needle 32G X 4 MM MISC 1 Device by Does not apply route in the morning, at noon, in the evening, and at bedtime.   ipratropium (ATROVENT) 0.06 % nasal spray Place 2 sprays into both nostrils 4 (four) times daily. (Patient taking differently: Place 2 sprays into both nostrils 4 (four) times daily. As needed)   ketoconazole (NIZORAL) 2 % shampoo Apply 1 application. topically 2 (two) times a week.   Lancets Misc. (ACCU-CHEK FASTCLIX LANCET) KIT Use to test blood sugar up to 3 times daily   levothyroxine (SYNTHROID) 150 MCG tablet TAKE ONE TABLET BY MOUTH BEFORE BREAKFAST   magnesium oxide (MAG-OX) 400 MG tablet TAKE ONE TABLET BY MOUTH EVERY MORNING   metoprolol succinate (TOPROL-XL) 25 MG 24 hr tablet TAKE ONE TABLET BY MOUTH ONCE DAILY with OR immedaitely following A meal   nitroGLYCERIN (NITROSTAT) 0.4 MG SL tablet Dissolve 1 tab under tongue as needed for chest pain. May repeat every 5 minutes x 2 doses. If no relief call 9-1-1.   pantoprazole (PROTONIX) 20 MG tablet TAKE ONE TABLET BY MOUTH ONCE DAILY  Polyethylene Glycol 3350 (MIRALAX PO) Take 1 Package by mouth daily as needed (constipation).   potassium chloride SA (KLOR-CON M20) 20 MEQ tablet Take 1 tablet (20 mEq total) by mouth daily.   Propylene Glycol (SYSTANE BALANCE OP) Place 1 drop into both eyes daily as needed (for dry eyes).   SALINE MIST SPRAY NA Place 2 sprays into the nose at bedtime.   Tafamidis 61 MG CAPS Take 61 mg by mouth daily.   torsemide (DEMADEX) 20 MG tablet Take 2 tablets (40 mg total) by mouth daily.   trolamine salicylate (ASPERCREME) 10 % cream Apply 1 application. topically as  needed for muscle pain.   warfarin (COUMADIN) 5 MG tablet TAKE 1/2 TABLET BY MOUTH ONCE DAILY OR AS DIRECTED BY COUMADIN CLINIC   No facility-administered encounter medications on file as of 10/06/2022.   Reviewed chart for medication changes ahead of medication coordination call.  BP Readings from Last 3 Encounters:  10/04/22 124/78  09/19/22 120/80  08/31/22 130/78    Lab Results  Component Value Date   HGBA1C 7.5 (A) 08/01/2022     Patient obtains medications through Adherence Packaging  30 Days    Last adherence delivery included: Warfarin 5 mg tablet (easy top vials) use as directed Allopurinol 100 mg 1 tablet at breakfast Klor-Con 20 meq - 1 tablet at breakfast Protonix 20 mg - 1 tablet at breakfast Aspirin 81 mg - 1 tablet at breakfast Magnesium oxide 400 mg 1 tablet at breakfast Cetirizine 10 mg 1 tablet at breakfast (OTC med) Atorvastatin 20 mg 1 tablet at bedtime Farxiga 10 mg 1 tablet at breakfast Metoprolol succinate 25 mg 1 tablet at breakfast Gabapentin 600 mg tablet  1 tablet at breakfast and 1 tablet at bedtime Levothyroxine 150 mcg 1 tablet before breakfast Torsemide 20 mg take 2 tablets at breakfast Pen needles 32 g X 4 mm Toujeo solostar 300 un - inject 22 units daily Humalog kwikpen 100 un - inject 30 units max dose daily   Patient declined (meds) last month: Dexcom not due til 10/15/2022 Nitrostat 0.51m - 1 tablet every 5 min as needed   Fluocinolone Acetonide 0.01 % OIL (ear drop) Ketoconazole (NIZORAL) 2 % shampoo   Patient is due for next adherence delivery on: 10/20/2022   Called patient and reviewed medications and coordinated delivery.   This delivery to include: Warfarin 5 mg tablet (easy top vials) use as directed Allopurinol 100 mg 1 tablet at breakfast Klor-Con 20 meq - 1 tablet at breakfast Protonix 20 mg - 1 tablet at breakfast Aspirin 81 mg - 1 tablet at breakfast Magnesium oxide 400 mg 1 tablet at breakfast Cetirizine 10 mg 1  tablet at breakfast (OTC med) Atorvastatin 20 mg 1 tablet at bedtime Farxiga 10 mg 1 tablet at breakfast Metoprolol succinate 25 mg 1 tablet at breakfast Gabapentin 600 mg tablet  1 tablet at breakfast and 1 tablet at bedtime Levothyroxine 150 mcg 1 tablet before breakfast Torsemide 20 mg take 2 tablets at breakfast Pen needles 32 g X 4 mm Toujeo solostar 300 un - inject 22 units daily (does pt need this) Humalog kwikpen 100 un - inject 30 units max dose daily   Patient will need a short fill: No short fill needed   Coordinated acute fill: No acute fill needed   Declined the following medications with last monthly:  Dexcom G6 sensors last filled 10/03/22 90 DS   Nitrostat 0.456m- 1 tablet every 5 min as needed   Fluocinolone  Acetonide 0.01 % OIL (ear drop) Ketoconazole (NIZORAL) 2 % shampoo   Unable to confirmed delivery date of 10/20/2022 after multiple attempts.   Care Gaps: AWV - scheduled 02/20/2023 Last BP - 124/78 on 10/04/2022 Last A1C - 7.5 on 08/01/2022  Star Rating Drugs: Atorvastatin 69m - last filled 08/17/2022 30 DS at [facility] Farxiga 123m- last filled 08/17/2022 30 [phone]

## 2022-10-07 ENCOUNTER — Encounter: Payer: Self-pay | Admitting: Sports Medicine

## 2022-10-07 ENCOUNTER — Ambulatory Visit (INDEPENDENT_AMBULATORY_CARE_PROVIDER_SITE_OTHER): Payer: Medicare Other | Admitting: Sports Medicine

## 2022-10-07 ENCOUNTER — Ambulatory Visit: Payer: Medicare Other | Attending: Cardiovascular Disease

## 2022-10-07 ENCOUNTER — Ambulatory Visit: Payer: Medicare Other

## 2022-10-07 DIAGNOSIS — Z5181 Encounter for therapeutic drug level monitoring: Secondary | ICD-10-CM | POA: Diagnosis not present

## 2022-10-07 DIAGNOSIS — M25512 Pain in left shoulder: Secondary | ICD-10-CM | POA: Diagnosis not present

## 2022-10-07 DIAGNOSIS — Z7901 Long term (current) use of anticoagulants: Secondary | ICD-10-CM | POA: Diagnosis not present

## 2022-10-07 DIAGNOSIS — W19XXXA Unspecified fall, initial encounter: Secondary | ICD-10-CM

## 2022-10-07 DIAGNOSIS — G8929 Other chronic pain: Secondary | ICD-10-CM | POA: Diagnosis not present

## 2022-10-07 DIAGNOSIS — I4819 Other persistent atrial fibrillation: Secondary | ICD-10-CM

## 2022-10-07 LAB — POCT INR: INR: 2.6 (ref 2.0–3.0)

## 2022-10-07 MED ORDER — METHYLPREDNISOLONE ACETATE 40 MG/ML IJ SUSP
40.0000 mg | INTRAMUSCULAR | Status: AC | PRN
  Administered 2022-10-07: 40 mg via INTRA_ARTICULAR

## 2022-10-07 MED ORDER — LIDOCAINE HCL 1 % IJ SOLN
2.0000 mL | INTRAMUSCULAR | Status: AC | PRN
  Administered 2022-10-07: 2 mL

## 2022-10-07 MED ORDER — BUPIVACAINE HCL 0.25 % IJ SOLN
2.0000 mL | INTRAMUSCULAR | Status: AC | PRN
  Administered 2022-10-07: 2 mL via INTRA_ARTICULAR

## 2022-10-07 NOTE — Progress Notes (Signed)
[place] November 12th  She had xrays initially after fall, and more recently 10/06/22. Her pain is primarily posterior shoulder Her ROM is ok Tylenol for pain She is doing HEP to help with ROM  Hx of right TSR so she knows what exercises to do for motion

## 2022-10-07 NOTE — Progress Notes (Signed)
Regina [name] - 81 y.o. female MRN [id number]  Date of birth: Jun 18, 1941  Office Visit Note: Visit Date: 10/07/2022 PCP: [name], [name], MD Referred by: [name], [name], MD  Subjective: Chief Complaint  Patient presents with   Left Shoulder - Pain   HPI: Regina [name] is a pleasant 81 y.o. female who presents today for left shoulder pain.  Her granddaughter is with her today as well.  She has had left shoulder pain, started after she fell onto the lateral aspect of the shoulder on November 12.  She initially had x-rays after the fall which were negative for fracture and a repeat set on 10/04/22 which I reviewed which did not show any evidence of fracture.  She has been taking Tylenol and using some topical Voltaren without much relief.  Pain does extend into the trapezius muscle and goes down the upper arm, no numbness or tingling or radicular symptoms down the arm or into the hands/fingers.  Pertinent ROS were reviewed with the patient and found to be negative unless otherwise specified above in HPI.   Assessment & Plan: Visit Diagnoses:  1. Chronic left shoulder pain   2. Fall, initial encounter    Plan: Discussed with Regina [name] and her granddaughter today that I think Regina [name] aggravated her shoulder joint and more so the rotator cuff muscles after her fall.  I do not see evidence of gross tearing as she has intact rotator cuff strength, but pain with provocative maneuvers of the rotator cuff.  Discussed treatment options such as prescription medication, formal physical therapy, home exercises and injection therapy.  She is unable to take NSAIDs given her Eliquis use and history of CAD.  She has had a bad reaction to oral prednisone in the past.  Decided to proceed with subacromial joint injection, she tolerated well.  We did provide her with new rotator cuff handouts and exercises for her to perform once daily at home.  She will see me back in 4 weeks, if not improving may consider  shockwave, formal physical therapy.  Recommended heat and topical IcyHot over the surrounding musculature and trapezius.  Follow-up: Return in about 4 weeks (around 11/04/2022).   Meds & Orders: No orders of the defined types were placed in this encounter.   Orders Placed This Encounter  Procedures   Large Joint Inj: L subacromial bursa     Procedures: Large Joint Inj: L subacromial bursa on 10/07/2022 2:47 PM Indications: pain Details: 22 G 1.5 in needle, posterior approach Medications: 2 mL lidocaine 1 %; 2 mL bupivacaine 0.25 %; 40 mg methylPREDNISolone acetate 40 MG/ML Outcome: tolerated well, no immediate complications  Subacromial Joint Injection, Left Shoulder After discussion on risks/benefits/indications, informed verbal consent was obtained. A timeout was then performed. Patient was seated on table in exam room. The patient's shoulder was prepped with betadine and alcohol swabs and utilizing posterior approach a 22G, 1.5" needle was directed anteriorly and laterally into the patient's subacromial space was injected with 2:2:1 mixture of lidocaine:bupivicaine:depomedrol with appreciation of free-flowing of the injectate into the bursal space. Patient tolerated the procedure well without immediate complications.   Procedure, treatment alternatives, risks and benefits explained, specific risks discussed. Consent was given by the patient. Immediately prior to procedure a time out was called to verify the correct patient, procedure, equipment, support staff and site/side marked as required. Patient was prepped and draped in the usual sterile fashion.          Clinical History: No  specialty comments available.  She reports that she has never smoked. She has never used smokeless tobacco.  Recent Labs    01/31/22 1356 08/01/22 1334  HGBA1C 7.3* 7.5*    Objective:   Vital Signs: There were no vitals taken for this visit.  Physical Exam  Gen: Well-appearing, in no acute  distress; non-toxic CV: Regular Rate. Well-perfused. Warm.  Resp: Breathing unlabored on room air; no wheezing. Psych: Fluid speech in conversation; appropriate affect; normal thought process Neuro: Sensation intact throughout. No gross coordination deficits.   Ortho Exam - Left shoulder: There is some generalized shoulder pain throughout the posterior lateral aspect of the shoulder.  Positive pain at Codman's point.  There is pain with resisted abduction and resisted external rotation.  Strength of the rotator cuff is intact without evidence of gross weakness.  There is pain with active range of motion but no restrictions with passive range of motion in all planes.  - Neck: There is some cervical paraspinal hypertonicity of the left SCM extending into the trapezius.  Full range of motion about the neck.  Negative Spurling's maneuver.  Imaging:  *Independent review of the left shoulder x-ray from 10/04/2022 shows no acute fracture, there is some moderate AC joint arthropathy, small bony spur of the greater tuberosity, no significant glenohumeral joint arthritis.  DG Shoulder Left CLINICAL DATA:  Persistent left shoulder pain  EXAM: LEFT SHOULDER - 3 VIEW  COMPARISON:  Chest radiograph dated 12/24/2021  FINDINGS: There is no evidence of fracture or dislocation. There is no evidence of arthropathy or other focal bone abnormality. Soft tissues are unremarkable. Partially imaged cervical spinal fixation hardware appears intact.  IMPRESSION: Normal radiographs of the left shoulder.  Electronically Signed   By: Darrin Nipper M.D.   On: 10/06/2022 20:42    Past Medical/Family/Surgical/Social History: Medications & Allergies reviewed per EMR, new medications updated. Patient Active Problem List   Diagnosis Date Noted   Multiple lipomas 10/04/2022   Dizziness, nonspecific 09/22/2022   Dysphagia 09/22/2022   Left shoulder pain 09/22/2022   Pain due to onychomycosis of toenail of left  foot 02/11/2022   Chest pain 12/26/2021   Left atrial thrombus 12/25/2021   Wild-type transthyretin-related (ATTR) amyloidosis (HCC)    Lung nodules 12/13/2021   Polymyalgia rheumatica ([place]) 06/30/2021   Neck pain 06/18/2021   Gait abnormality 06/18/2021   Memory loss 06/18/2021   Atherosclerosis of aorta ([place]) 06/09/2021   Type 2 diabetes mellitus with stage 3a chronic kidney disease, with long-term current use of insulin ([place]) 09/14/2020   Type 2 diabetes mellitus with retinopathy, with long-term current use of insulin ([place]) 09/14/2020   Diabetes mellitus ([place]) 09/14/2020   Type 2 diabetes mellitus with diabetic polyneuropathy, with long-term current use of insulin ([place]) 09/14/2020   Long term (current) use of anticoagulants 07/13/2020   Persistent atrial fibrillation (HCC)    Mixed hyperlipidemia    Acute on chronic diastolic CHF (congestive heart failure), NYHA class 3 ([place])    Demand ischemia    Atrial fibrillation ([place]) 04/24/2020   Other fatigue 03/24/2020   Short of breath on exertion 03/24/2020   Congestive heart failure ([place]) 03/24/2020   Vitamin D deficiency 03/24/2020   Sleep apnea 05/21/2019   History of hepatitis C 05/21/2019   Insomnia 05/21/2019   CKD (chronic kidney disease) stage 4, GFR 15-29 ml/min (HCC) 05/21/2019   GERD (gastroesophageal reflux disease) 04/20/2018   Type 2 diabetes mellitus with diabetic neuropathy, unspecified ([place]) 12/19/2016   History of  TIA (transient ischemic attack) 12/19/2016   Hyperlipidemia associated with type 2 diabetes mellitus ([place]) 12/19/2016   S/P total knee replacement using cement, right 03/18/2015   Non-obstructive CAD    Cardiovascular stress test abnormal    Pleuritic chest pain 01/16/2015   Acute renal failure superimposed on stage 3 chronic kidney disease ([place]) 01/16/2015   Obesity, Class III, BMI 40-49.9 (morbid obesity) ([place]) 01/16/2015   Essential hypertension 01/16/2015   Hypothyroidism 01/16/2015   OSA on CPAP  01/16/2015   DM type 2 (diabetes mellitus, type 2) ([place]) 01/16/2015   Coronary artery disease involving native coronary artery without angina pectoris 10/02/2014   Physical deconditioning 02/28/2013   Morbid obesity ([place]) 02/27/2013   Osteoarthritis 02/27/2013   Past Medical History:  Diagnosis Date   Back pain    CHF (congestive heart failure) (HCC)    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 01/2015 Cardiolite: + inf wall ischemia, EF 56%;  b. 01/2015 Cath: LM nl, LAD 32m LCX min irregs, RCA dominant, 50-648m  Sleep apnea    Swallowing difficulty    Family History  Problem Relation Age of Onset   Heart disease Mother    Hypertension Mother    Cancer Father    Alcoholism Father    Past Surgical History:  Procedure Laterality Date   ABDOMINAL HYSTERECTOMY     BUBBLE STUDY  07/01/2020   Procedure: BUBBLE STUDY;  Surgeon: AcElouise MunroeMD;  [facility];;   BUBBLE STUDY  08/05/2020   Procedure: BUBBLE STUDY;  Surgeon: O'Geralynn RileMD;  [facility]: Cardiovascular;;  done with definity    CARDIAC CATHETERIZATION     ESOPHAGOGASTRODUODENOSCOPY     a. 01/2015 EGD: patent esophagus.   ESOPHAGOGASTRODUODENOSCOPY N/A 01/20/2015   Procedure: ESOPHAGOGASTRODUODENOSCOPY (EGD);  Surgeon: PaCarol AdaMD;  [facility];  Service: Endoscopy;  Laterality: N/A;   HAND SURGERY     JOINT REPLACEMENT     reports history bilateral TKA and right TSA   LEFT HEART CATHETERIZATION WITH CORONARY ANGIOGRAM N/A 01/19/2015   RIGHT HEART CATH N/A 12/27/2021   Procedure: RIGHT HEART CATH;  Surgeon: BeLorretta HarpMD;  [facility];  Service: Cardiovascular;  Laterality: N/A;   TEE WITHOUT CARDIOVERSION  05/04/2020   TEE WITHOUT CARDIOVERSION N/A 05/05/2020   Procedure: TRANSESOPHAGEAL ECHOCARDIOGRAM (TEE);  Surgeon: HiPixie CasinoMD;  [facility];  Service: Cardiovascular;  Laterality: N/A;   TEE WITHOUT CARDIOVERSION N/A 07/01/2020   Procedure: TRANSESOPHAGEAL ECHOCARDIOGRAM (TEE);  Surgeon: AcElouise MunroeMD;  [facility] Cardi[facility];  Laterality: N/A;   TEE WITHOUT CARDIOVERSION N/A 08/05/2020   Procedure: TRANSESOPHAGEAL ECHOCARDIOGRAM (TEE);  Surgeon: O'Geralynn RileMD;  [facility]: Cardiovascular;  Laterality: N/A;   THYROIDECTOMY     Social History   Occupational History   Occupation: Retired  Tobacco Use   Smoking status: Never   Smokeless tobacco: Never  Vaping Use   Vaping Use: Never used  Substance and Sexual Activity   Alcohol use: No   Drug use: Never  Sexual activity: Not Currently

## 2022-10-07 NOTE — Patient Instructions (Signed)
Continue taking warfarin 1/2 tablet daily. Repeat INR in 5 weeks; Anticoagulation Clinic [phone]

## 2022-10-13 ENCOUNTER — Telehealth: Payer: Self-pay

## 2022-10-13 NOTE — Telephone Encounter (Signed)
--  Caller states she has a dry cough and a tickle in her throat. Started yesterday.  10/13/2022 9:47:29 [place], RN, Lelan Pons

## 2022-10-14 ENCOUNTER — Ambulatory Visit (INDEPENDENT_AMBULATORY_CARE_PROVIDER_SITE_OTHER): Payer: Medicare Other | Admitting: Family Medicine

## 2022-10-14 ENCOUNTER — Encounter: Payer: Self-pay | Admitting: Family Medicine

## 2022-10-14 VITALS — BP 124/70 | HR 91 | Temp 98.6°F | Wt 240.0 lb

## 2022-10-14 DIAGNOSIS — R059 Cough, unspecified: Secondary | ICD-10-CM

## 2022-10-14 DIAGNOSIS — J069 Acute upper respiratory infection, unspecified: Secondary | ICD-10-CM | POA: Diagnosis not present

## 2022-10-14 LAB — POC COVID19 BINAXNOW: SARS Coronavirus 2 Ag: NEGATIVE

## 2022-10-14 MED ORDER — BENZONATATE 200 MG PO CAPS: 200.0000 mg | ORAL_CAPSULE | Freq: Four times a day (QID) | ORAL | 0 refills | Status: DC | PRN

## 2022-10-14 NOTE — Progress Notes (Signed)
   Subjective:    Patient ID: [name], female    DOB: 07-28-1941, 81 y.o.   MRN: [id number]  HPI Here for 3 days of a dry cough. No other symptoms, no fever or ST or SOB. She is drinking fluids.    Review of Systems  Constitutional: Negative.   HENT: Negative.    Eyes: Negative.   Respiratory:  Positive for cough. Negative for choking, chest tightness, shortness of breath and wheezing.   Cardiovascular: Negative.        Objective:   Physical Exam Constitutional:      Appearance: Normal appearance.     Comments: She coughs occasionally   HENT:     Right Ear: Tympanic membrane, ear canal and external ear normal.     Left Ear: Tympanic membrane, ear canal and external ear normal.     Nose: Nose normal.     Mouth/Throat:     Pharynx: Oropharynx is clear.  Eyes:     Conjunctiva/sclera: Conjunctivae normal.  Cardiovascular:     Rate and Rhythm: Normal rate and regular rhythm.     Pulses: Normal pulses.     Heart sounds: Normal heart sounds.  Pulmonary:     Effort: Pulmonary effort is normal.     Breath sounds: Normal breath sounds.  Musculoskeletal:     Right lower leg: No edema.     Left lower leg: No edema.  Lymphadenopathy:     Cervical: No cervical adenopathy.  Neurological:     Mental Status: She is alert.           Assessment & Plan:  Viral URI. She can use Benzonatate as needed. This should resolve in the next few days.  Alysia Penna, MD

## 2022-10-18 ENCOUNTER — Telehealth: Payer: Self-pay | Admitting: Pharmacist

## 2022-10-18 MED ORDER — DEXCOM G7 SENSOR MISC: 1.0000 | 3 refills | Status: DC

## 2022-10-18 MED ORDER — DEXCOM G7 RECEIVER DEVI: 1.0000 | 0 refills | Status: DC

## 2022-10-18 NOTE — Telephone Encounter (Signed)
Called patient to make her aware of the switch to the [place]. Patient's insurance will not cover it until the end of Feb because she just received a 90 ds of the sensors in December. Patient verbalized her understanding and will attempt to call Dexcom support again.

## 2022-10-18 NOTE — Telephone Encounter (Signed)
Patient called as she is having a lot of issues with her Dexcom G6. She is unsure if the transmitters or sensors are not working. She inquired about switching to the Dexcom G7 to see if this would help with some of the problems. Patient was informed there is no external transmitter for the G7 which would be helpful for her. Will reach out to endo to discuss.

## 2022-10-27 ENCOUNTER — Telehealth: Payer: Self-pay | Admitting: Cardiovascular Disease

## 2022-10-27 NOTE — Telephone Encounter (Signed)
Patient would like a call back to explain what the Amvuttra people told her about her medication and the co-pay.

## 2022-10-27 NOTE — Telephone Encounter (Signed)
Patient stated she will not have injection of vutrisiran due to need for foundation support.

## 2022-11-01 NOTE — Telephone Encounter (Signed)
LMOM for daughter to return call  Manufacturer [employer] no longer gives financial support for those whose OOP medical limit is < $5,000/year.  (Ms. Gaughran's plan in $4900).  It was suggested to look towards [employer], [employer] or [employer] [facility] for help - all are currently closed.  Will review with Dr. Audie Box about potentially trying W[place]ua (eplontersen) that was just FDA approved several weeks ago.

## 2022-11-04 NOTE — Telephone Encounter (Signed)
Spoke with daughter and explained situation.  She is agreeable to wait for information regarding Wainua (eplontersen).

## 2022-11-07 ENCOUNTER — Telehealth: Payer: Self-pay | Admitting: Pharmacist

## 2022-11-07 NOTE — Progress Notes (Unsigned)
Care Management & Coordination Services Pharmacy Team  Name: Regina [name]  MRN: [id number] DOB: 05-Feb-1941  Reason for Encounter: Medication coordination and delivery  Contacted patient on 11/07/2022 to discuss medications   Recent office visits:  10/14/2022 Alysia Penna MD - Patient was seen for viral URI with cough and an additional concern. Started Benzonatate 200 mg q 6 hrs prn.   Recent consult visits:  10/07/2022 Elba Barman DO ([facility]) - Patient was seen for chronic left shoulder pain and an additional concern. No medication changes.   Hospital visits:  None  Medications: Outpatient Encounter Medications as of 11/07/2022  Medication Sig   acetaminophen (TYLENOL) 500 MG tablet Take 1,000 mg by mouth every 6 (six) hours as needed for mild pain.   Alcohol Swabs (B-D SINGLE USE SWABS REGULAR) PADS Use to test blood sugar up to 3 times daily   allopurinol (ZYLOPRIM) 100 MG tablet TAKE ONE TABLET BY MOUTH EVERY MORNING   aspirin EC 81 MG EC tablet Take 1 tablet (81 mg total) by mouth daily. Swallow whole.   atorvastatin (LIPITOR) 20 MG tablet TAKE ONE TABLET BY MOUTH EVERYDAY AT BEDTIME   benzonatate (TESSALON) 200 MG capsule Take 1 capsule (200 mg total) by mouth every 6 (six) hours as needed for cough.   Blood Glucose Monitoring Suppl (ACCU-CHEK GUIDE) w/Device KIT 1 Device by Does not apply route 3 (three) times daily.   Cholecalciferol (VITAMIN D3) 50 MCG (2000 UT) capsule Take 2,000 Units by mouth daily.   Continuous Blood Gluc Receiver (DEXCOM G7 RECEIVER) DEVI 1 Device by Does not apply route as directed.   Continuous Blood Gluc Sensor (DEXCOM G7 SENSOR) MISC 1 Device by Does not apply route as directed.   Continuous Blood Gluc Transmit (DEXCOM G6 TRANSMITTER) MISC 1 Device by Does not apply route as directed.   famotidine (PEPCID) 20 MG tablet Take 1 tablet (20 mg total) by mouth at bedtime.   FARXIGA 10 MG TABS tablet TAKE ONE TABLET BY MOUTH ONCE DAILY   Fluocinolone  Acetonide 0.01 % OIL instill one drop in ears every 2-3 DAYS AS NEEDED FOR pruritis   gabapentin (NEURONTIN) 600 MG tablet TAKE ONE TABLET BY MOUTH EVERY MORNING and TAKE ONE TABLET BY MOUTH EVERYDAY AT BEDTIME   glucose blood (ACCU-CHEK GUIDE) test strip Use to test blood sugar up to 3 times daily   insulin glargine, 1 Unit Dial, (TOUJEO SOLOSTAR) 300 UNIT/ML Solostar Pen Inject 22 Units into the skin daily. Eat a snack with protein nightly before bedtime.   insulin lispro (HUMALOG KWIKPEN) 100 UNIT/ML KwikPen Max daily 30 units   Insulin Pen Needle 32G X 4 MM MISC 1 Device by Does not apply route in the morning, at noon, in the evening, and at bedtime.   ipratropium (ATROVENT) 0.06 % nasal spray Place 2 sprays into both nostrils 4 (four) times daily. (Patient taking differently: Place 2 sprays into both nostrils 4 (four) times daily. As needed)   ketoconazole (NIZORAL) 2 % shampoo Apply 1 application. topically 2 (two) times a week.   Lancets Misc. (ACCU-CHEK FASTCLIX LANCET) KIT Use to test blood sugar up to 3 times daily   levothyroxine (SYNTHROID) 150 MCG tablet TAKE ONE TABLET BY MOUTH BEFORE BREAKFAST   magnesium oxide (MAG-OX) 400 MG tablet TAKE ONE TABLET BY MOUTH EVERY MORNING   metoprolol succinate (TOPROL-XL) 25 MG 24 hr tablet TAKE ONE TABLET BY MOUTH ONCE DAILY with OR immedaitely following A meal   nitroGLYCERIN (NITROSTAT) 0.4  MG SL tablet Dissolve 1 tab under tongue as needed for chest pain. May repeat every 5 minutes x 2 doses. If no relief call 9-1-1.   pantoprazole (PROTONIX) 20 MG tablet TAKE ONE TABLET BY MOUTH ONCE DAILY   Polyethylene Glycol 3350 (MIRALAX PO) Take 1 Package by mouth daily as needed (constipation).   potassium chloride SA (KLOR-CON M20) 20 MEQ tablet Take 1 tablet (20 mEq total) by mouth daily.   Propylene Glycol (SYSTANE BALANCE OP) Place 1 drop into both eyes daily as needed (for dry eyes).   SALINE MIST SPRAY NA Place 2 sprays into the nose at bedtime.    Tafamidis 61 MG CAPS Take 61 mg by mouth daily.   torsemide (DEMADEX) 20 MG tablet Take 2 tablets (40 mg total) by mouth daily.   trolamine salicylate (ASPERCREME) 10 % cream Apply 1 application. topically as needed for muscle pain.   warfarin (COUMADIN) 5 MG tablet TAKE 1/2 TABLET BY MOUTH ONCE DAILY OR AS DIRECTED BY COUMADIN CLINIC   No facility-administered encounter medications on file as of 11/07/2022.   BP Readings from Last 3 Encounters:  10/14/22 124/70  10/04/22 124/78  09/19/22 120/80    Pulse Readings from Last 3 Encounters:  10/14/22 91  10/04/22 95  09/19/22 86    Lab Results  Component Value Date/Time   HGBA1C 7.5 (A) 08/01/2022 01:34 PM   HGBA1C 7.3 (A) 01/31/2022 01:56 PM   HGBA1C 7.9 (H) 08/11/2021 08:15 AM   HGBA1C 7.2 (H) 06/18/2021 11:10 AM   Lab Results  Component Value Date   CREATININE 2.2 (A) 05/17/2022   BUN 46 (A) 05/17/2022   GFR 24.47 (L) 08/13/2021   GFRNONAA 23 (L) 01/12/2022   GFRAA 26 10/20/2021   NA 138 05/17/2022   K 4.1 05/17/2022   CALCIUM 9.2 05/17/2022   CO2 31 (A) 05/17/2022    Last adherence delivery date:10/20/2022      Patient is due for next adherence delivery on: 11/18/2022  {Med Review:25223}  This delivery to include: Adherence Packaging  30 Days  Warfarin 5 mg tablet (easy top vials) use as directed Allopurinol 100 mg 1 tablet at breakfast Klor-Con 20 meq - 1 tablet at breakfast Protonix 20 mg - 1 tablet at breakfast Aspirin 81 mg - 1 tablet at breakfast Magnesium oxide 400 mg 1 tablet at breakfast Cetirizine 10 mg 1 tablet at breakfast (OTC med) Atorvastatin 20 mg 1 tablet at bedtime Farxiga 10 mg 1 tablet at breakfast Metoprolol succinate 25 mg 1 tablet at breakfast Gabapentin 600 mg tablet  1 tablet at breakfast and 1 tablet at bedtime Levothyroxine 150 mcg 1 tablet before breakfast Torsemide 20 mg take 2 tablets at breakfast Pen needles 32 g X 4 mm Toujeo solostar 300 un - inject 22 units daily  Humalog  kwikpen 100 un - inject 30 units max dose daily  Patient declined the following medications this month: Dexcom G6 sensors last filled 10/03/22 90 DS   Nitrostat 0.'4mg'$  - 1 tablet every 5 min as needed   Fluocinolone Acetonide 0.01 % OIL (ear drop) Ketoconazole (NIZORAL) 2 % shampoo  {Delivery date:25786}  Any concerns about your medications? {yes/no:20286}  How often do you forget or accidentally miss a dose? {Missed doses:25554}  Is patient in packaging Yes   Recent blood pressure readings are as follows:***  Recent blood glucose readings are as follows:***  Cycle dispensing form sent to *** for review.  Care Gaps: AWV - scheduled 02/20/2023 Last BP - 124/70  on 10/14/2022 Last A1C - 7.5 on 08/01/2022 Tdap - never done Shingrix - overdue Covid - overdue Urine ACR - overdue  Star Rating Drugs: Atorvastatin '20mg'$  - last filled 10/17/2022 30 DS at [facility] '10mg'$  - last filled 10/17/2022 30 DS at [facility] [phone]

## 2022-11-11 ENCOUNTER — Ambulatory Visit: Payer: Medicare Other | Attending: Cardiovascular Disease

## 2022-11-11 ENCOUNTER — Other Ambulatory Visit: Payer: Self-pay | Admitting: Family Medicine

## 2022-11-11 DIAGNOSIS — Z7901 Long term (current) use of anticoagulants: Secondary | ICD-10-CM

## 2022-11-11 DIAGNOSIS — Z5181 Encounter for therapeutic drug level monitoring: Secondary | ICD-10-CM | POA: Diagnosis not present

## 2022-11-11 DIAGNOSIS — I4819 Other persistent atrial fibrillation: Secondary | ICD-10-CM

## 2022-11-11 LAB — POCT INR: INR: 2.6 (ref 2.0–3.0)

## 2022-11-11 NOTE — Patient Instructions (Signed)
Continue taking warfarin 1/2 tablet daily. Repeat INR in 5 weeks; Anticoagulation Clinic [phone]

## 2022-11-14 ENCOUNTER — Telehealth: Payer: Self-pay | Admitting: Family Medicine

## 2022-11-14 NOTE — Telephone Encounter (Signed)
Pt called to say she got a shot of Amvuttra in 2023 - one shot  They are telling her that In 2024, she has to reapply and start taking vitamin A tablets  Pt is waiting to be qualified for her 2024 shots. The shot is supposed to be administered every 3 mos. Pt wants to know if she should keep taking the Vitamin A? Pt is asking for a call back.

## 2022-11-16 DIAGNOSIS — I509 Heart failure, unspecified: Secondary | ICD-10-CM | POA: Diagnosis not present

## 2022-11-16 DIAGNOSIS — N1832 Chronic kidney disease, stage 3b: Secondary | ICD-10-CM | POA: Diagnosis not present

## 2022-11-16 DIAGNOSIS — I43 Cardiomyopathy in diseases classified elsewhere: Secondary | ICD-10-CM | POA: Diagnosis not present

## 2022-11-16 DIAGNOSIS — E1122 Type 2 diabetes mellitus with diabetic chronic kidney disease: Secondary | ICD-10-CM | POA: Diagnosis not present

## 2022-11-16 DIAGNOSIS — I2510 Atherosclerotic heart disease of native coronary artery without angina pectoris: Secondary | ICD-10-CM | POA: Diagnosis not present

## 2022-11-16 DIAGNOSIS — E785 Hyperlipidemia, unspecified: Secondary | ICD-10-CM | POA: Diagnosis not present

## 2022-11-16 DIAGNOSIS — E854 Organ-limited amyloidosis: Secondary | ICD-10-CM | POA: Diagnosis not present

## 2022-11-16 DIAGNOSIS — I129 Hypertensive chronic kidney disease with stage 1 through stage 4 chronic kidney disease, or unspecified chronic kidney disease: Secondary | ICD-10-CM | POA: Diagnosis not present

## 2022-11-16 LAB — LAB REPORT - SCANNED
Albumin, Urine POC: 33.2
Albumin/Creatinine Ratio, Urine, POC: 45
Creatinine, POC: 73.2 mg/dL
EGFR: 22

## 2022-11-18 NOTE — Telephone Encounter (Signed)
This is a question for the provider ordering her Amvuttra injections.  Thanks, BJ

## 2022-11-18 NOTE — Telephone Encounter (Signed)
I spoke with patient, she is aware of message below.

## 2022-11-23 DIAGNOSIS — H16143 Punctate keratitis, bilateral: Secondary | ICD-10-CM | POA: Diagnosis not present

## 2022-11-23 DIAGNOSIS — H0288B Meibomian gland dysfunction left eye, upper and lower eyelids: Secondary | ICD-10-CM | POA: Diagnosis not present

## 2022-11-23 DIAGNOSIS — H04123 Dry eye syndrome of bilateral lacrimal glands: Secondary | ICD-10-CM | POA: Diagnosis not present

## 2022-11-24 ENCOUNTER — Telehealth: Payer: Self-pay | Admitting: Cardiovascular Disease

## 2022-11-24 NOTE — Telephone Encounter (Signed)
Pt c/o medication issue:  1. Name of Medication: [place]  2. How are you currently taking this medication (dosage and times per day)?  Not yet taking  3. Are you having a reaction (difficulty breathing--STAT)?   4. What is your medication issue?   Caller reporting key code for Cover My Meds:  #[id number]

## 2022-11-25 NOTE — Telephone Encounter (Signed)
[facility] Res[facility] with [facility] is calling stating they need further information for PA.

## 2022-11-25 NOTE — Telephone Encounter (Signed)
PA submitted 11/25/22

## 2022-11-28 MED ORDER — WAINUA 45 MG/0.8ML ~~LOC~~ SOAJ: 45.0000 mg | SUBCUTANEOUS | 12 refills | Status: DC

## 2022-11-28 NOTE — Progress Notes (Signed)
Care Management & Coordination Services Pharmacy Note  11/28/2022 Name:  Regina [name] MRN:  [id number] DOB:  1941/08/06  Summary: -Unable to adequately assess sugars today, patient has been having difficulty using Dexcom and is back to pricking fingers, reports "mostly under 200" -Recommended K Free Daily multivitamin so patient could get vitamin A supplementation with her [place]ua injection without Vit K  Recommendations/Changes made from today's visit: -Pt asked about daily vitamin A supplementation, recommended multivitamin without Vitamin K (K free daily) -Continue medication therapy -Continue to be mindful of salt and water intake in your diet  Follow up plan: Pharmacist visit in 4 months   Subjective: Regina [name] is an 82 y.o. year old female who is a primary patient of [name], Malka So, MD.  The care coordination team was consulted for assistance with disease management and care coordination needs.    Engaged with patient by telephone for follow up visit.  Recent office visits: 11/11/22 Frederik Schmidt, RN (Anticoag). INR 2.6, no medication changes  10/14/2022 Alysia Penna MD - Patient was seen for viral URI with cough and an additional concern. Started Benzonatate 200 mg q 6 hrs prn.    Recent consult visits: 10/07/2022 Elba Barman DO (sport med) - Patient was seen for chronic left shoulder pain and an additional concern. No medication changes.   Hospital visits: None in previous 6 months   Objective:  Lab Results  Component Value Date   CREATININE 2.2 (A) 05/17/2022   BUN 46 (A) 05/17/2022   GFR 24.47 (L) 08/13/2021   EGFR 25 (L) 10/29/2021   GFRNONAA 23 (L) 01/12/2022   GFRAA 26 10/20/2021   NA 138 05/17/2022   K 4.1 05/17/2022   CALCIUM 9.2 05/17/2022   CO2 31 (A) 05/17/2022   GLUCOSE 161 (H) 01/12/2022    Lab Results  Component Value Date/Time   HGBA1C 7.5 (A) 08/01/2022 01:34 PM   HGBA1C 7.3 (A) 01/31/2022 01:56 PM   HGBA1C 7.9 (H) 08/11/2021  08:15 AM   HGBA1C 7.2 (H) 06/18/2021 11:10 AM   FRUCTOSAMINE 325 (H) 09/09/2019 08:57 AM   FRUCTOSAMINE 349 (H) 05/22/2019 04:31 PM   GFR 24.47 (L) 08/13/2021 03:01 PM   GFR 20.67 (L) 02/15/2021 11:32 AM   MICROALBUR <0.7 08/13/2021 03:01 PM   MICROALBUR 1.9 05/22/2019 04:31 PM    Last diabetic Eye exam:  Lab Results  Component Value Date/Time   HMDIABEYEEXA No Retinopathy 05/18/2022 12:00 AM    Last diabetic Foot exam: No results found for: "HMDIABFOOTEX"   Lab Results  Component Value Date   CHOL 115 08/13/2021   HDL 54.80 08/13/2021   LDLCALC 40 08/13/2021   TRIG 101.0 08/13/2021   CHOLHDL 2 08/13/2021       Latest Ref Rng & Units 05/17/2022   12:00 AM 10/20/2021   12:00 AM 04/25/2020    1:22 AM  Hepatic Function  Total Protein 6.5 - 8.1 g/dL   7.7   Albumin 3.5 - 5.0 3.9     4.1     3.6   AST 15 - 41 U/L   24   ALT 0 - 44 U/L   18   Alk Phosphatase 38 - 126 U/L   51   Total Bilirubin 0.3 - 1.2 mg/dL   0.7   Bilirubin, Direct 0.0 - 0.2 mg/dL   <0.1      This result is from an external source.    Lab Results  Component Value Date/Time   TSH 0.65 06/09/2021  11:06 AM   TSH 3.24 06/08/2020 08:35 AM       Latest Ref Rng & Units 08/31/2022   12:52 PM 05/17/2022   12:00 AM 01/04/2022    6:43 AM  CBC  WBC 4.0 - 10.5 K/uL 6.0   6.8   Hemoglobin 12.0 - 15.0 g/dL 12.2  12.0     13.3   Hematocrit 36.0 - 46.0 % 37.5   41.0   Platelets 150.0 - 400.0 K/uL 282.0   290      This result is from an external source.    Lab Results  Component Value Date/Time   VD25OH 55.3 03/24/2020 02:28 PM   VD25OH 59.65 05/22/2019 04:31 PM   VITAMINB12 810 06/18/2021 11:10 AM    Clinical ASCVD: Yes  The ASCVD Risk score (Arnett DK, et al., 2019) failed to calculate for the following reasons:   The patient has a prior MI or stroke diagnosis        09/19/2022   11:05 AM 03/18/2022   12:03 PM 02/16/2022   10:00 AM  Depression screen PHQ 2/9  Decreased Interest 0 0 0  Down, Depressed,  Hopeless 0 0 0  PHQ - 2 Score 0 0 0     Social History   Tobacco Use  Smoking Status Never  Smokeless Tobacco Never   BP Readings from Last 3 Encounters:  10/14/22 124/70  10/04/22 124/78  09/19/22 120/80   Pulse Readings from Last 3 Encounters:  10/14/22 91  10/04/22 95  09/19/22 86   Wt Readings from Last 3 Encounters:  10/14/22 240 lb (108.9 kg)  10/04/22 223 lb 8 oz (101.4 kg)  09/19/22 227 lb 6 oz (103.1 kg)   BMI Readings from Last 3 Encounters:  10/14/22 43.90 kg/m  10/04/22 40.88 kg/m  09/19/22 41.59 kg/m    Allergies  Allergen Reactions   Ace Inhibitors Other (See Comments)    unknown   Amlodipine Other (See Comments)    unknown   Atenolol Other (See Comments)    bradycardia   Avandia [Rosiglitazone] Other (See Comments)    unknown   Darvon [Propoxyphene] Other (See Comments)    unknown   Erythromycin Itching   Hydralazine Other (See Comments)    Burning in throat and chest   Hydrocodone Other (See Comments)    Hallucinations.   Levofloxacin Itching   Morphine And Related Other (See Comments)    Dizzy and hallucianation, vomiting; Willing to try low dose   Percocet [Oxycodone-Acetaminophen] Other (See Comments)    hallucination   Spironolactone Other (See Comments)    unknown   Tramadol Other (See Comments)    Unknown/does not recall reaction but does not want to take again    Medications Reviewed Today     Reviewed by Laurey Morale, MD (Physician) on 10/14/22 at 1501  Med List Status: <None>   Medication Order Taking? Sig Documenting Provider Last Dose Status Informant  acetaminophen (TYLENOL) 500 MG tablet [id number] Yes Take 1,000 mg by mouth every 6 (six) hours as needed for mild pain. [provider] Taking Active Self  Alcohol Swabs (B-D SINGLE USE SWABS REGULAR) PADS [id number] Yes Use to test blood sugar up to 3 times daily Shamleffer, Melanie Crazier, MD Taking Active Self  allopurinol (ZYLOPRIM) 100 MG tablet [id number]  Yes TAKE ONE TABLET BY MOUTH EVERY MORNING [name], [name], MD Taking Active   aspirin EC 81 MG EC tablet [id number] Yes Take 1 tablet (81 mg total) by mouth daily.  Swallow whole. Pokhrel, Corrie Mckusick, MD Taking Active Self  atorvastatin (LIPITOR) 20 MG tablet [id number] Yes TAKE ONE TABLET BY MOUTH EVERYDAY AT BEDTIME [name], [name], MD Taking Active   Blood Glucose Monitoring Suppl (ACCU-CHEK GUIDE) w/Device KIT [id number] Yes 1 Device by Does not apply route 3 (three) times daily. Shamleffer, Melanie Crazier, MD Taking Active   Cholecalciferol (VITAMIN D3) 50 MCG (2000 UT) capsule [id number] Yes Take 2,000 Units by mouth daily. [provider] Taking Active Self  Continuous Blood Gluc Sensor (DEXCOM G6 SENSOR) MISC [id number] Yes 1 Device by Does not apply route as directed. Shamleffer, Melanie Crazier, MD Taking Active   Continuous Blood Gluc Transmit (DEXCOM G6 TRANSMITTER) MISC [id number] Yes 1 Device by Does not apply route as directed. Shamleffer, Melanie Crazier, MD Taking Active   famotidine (PEPCID) 20 MG tablet [id number] Yes Take 1 tablet (20 mg total) by mouth at bedtime. [name], [name], MD Taking Active Self  FARXIGA 10 MG TABS tablet [id number] Yes TAKE ONE TABLET BY MOUTH ONCE DAILY O'Neal, Cassie Freer, MD Taking Active   Fluocinolone Acetonide 0.01 % OIL [id number] Yes instill one drop in ears every 2-3 DAYS AS NEEDED FOR pruritis [name], [name], MD Taking Active   gabapentin (NEURONTIN) 600 MG tablet [id number] Yes TAKE ONE TABLET BY MOUTH EVERY MORNING and TAKE ONE TABLET BY MOUTH EVERYDAY AT BEDTIME [name], [name], MD Taking Active   glucose blood (ACCU-CHEK GUIDE) test strip [id number] Yes Use to test blood sugar up to 3 times daily Shamleffer, Melanie Crazier, MD Taking Active   insulin glargine, 1 Unit Dial, (TOUJEO SOLOSTAR) 300 UNIT/ML Solostar Pen [id number] Yes Inject 22 Units into the skin daily. Eat a snack with protein nightly before bedtime. Shamleffer, Melanie Crazier, MD  Taking Active   insulin lispro (HUMALOG KWIKPEN) 100 UNIT/ML KwikPen [id number] Yes Max daily 30 units Shamleffer, Melanie Crazier, MD Taking Active   Insulin Pen Needle 32G X 4 MM MISC [id number] Yes 1 Device by Does not apply route in the morning, at noon, in the evening, and at bedtime. Shamleffer, Melanie Crazier, MD Taking Active   ipratropium (ATROVENT) 0.06 % nasal spray [id number] Yes Place 2 sprays into both nostrils 4 (four) times daily.  Patient taking differently: Place 2 sprays into both nostrils 4 (four) times daily. As needed   [name], [name], MD Taking Active Self  ketoconazole (NIZORAL) 2 % shampoo [id number] Yes Apply 1 application. topically 2 (two) times a week. [name], [name], MD Taking Active   Lancets Misc. (ACCU-CHEK FASTCLIX LANCET) KIT [id number] Yes Use to test blood sugar up to 3 times daily Shamleffer, Melanie Crazier, MD Taking Active   levothyroxine (SYNTHROID) 150 MCG tablet [id number] Yes TAKE ONE TABLET BY MOUTH BEFORE BREAKFAST [name], [name], MD Taking Active   magnesium oxide (MAG-OX) 400 MG tablet [id number] Yes TAKE ONE TABLET BY MOUTH EVERY MORNING [name], [name], MD Taking Active   metoprolol succinate (TOPROL-XL) 25 MG 24 hr tablet [id number] Yes TAKE ONE TABLET BY MOUTH ONCE DAILY with OR immedaitely following A meal O'Neal, Cassie Freer, MD Taking Active   nitroGLYCERIN (NITROSTAT) 0.4 MG SL tablet [id number] Yes Dissolve 1 tab under tongue as needed for chest pain. May repeat every 5 minutes x 2 doses. If no relief call 9-1-1. Geralynn Rile, MD Taking Active   pantoprazole (PROTONIX) 20 MG tablet [id number] Yes TAKE ONE TABLET BY MOUTH ONCE DAILY [name], [name], MD Taking Active   Polyethylene Glycol 3350 (MIRALAX PO) [id number]  Yes Take 1 Package by mouth daily as needed (constipation). [provider] Taking Active Self  potassium chloride SA (KLOR-CON M20) 20 MEQ tablet [id number]  Take 1 tablet (20 mEq total) by mouth daily. Geralynn Rile, MD  Expired 07/08/22 2359   Propylene Glycol (SYSTANE BALANCE OP) [id number] Yes Place 1 drop into both eyes daily as needed (for dry eyes). [provider] Taking Active Self  SALINE MIST SPRAY NA [id number] Yes Place 2 sprays into the nose at bedtime. [provider] Taking Active Self  Tafamidis 61 MG CAPS [id number] Yes Take 61 mg by mouth daily. Geralynn Rile, MD Taking Active Self  torsemide [facility]) 20 MG tablet [id number] Yes Take 2 tablets (40 mg total) by mouth daily. Geralynn Rile, MD Taking Active   trolamine salicylate (ASPERCREME) 10 % cream [id number] Yes Apply 1 application. topically as needed for muscle pain. [provider] Taking Active Self  warfarin (COUMADIN) 5 MG tablet [id number] Yes TAKE 1/2 TABLET BY MOUTH ONCE DAILY OR AS DIRECTED BY COUMADIN CLINIC O'Neal, Cassie Freer, MD Taking Active   Med List Note [facility] 12/27/21 1157): `            SDOH:  (Social Determinants of Health) assessments and interventions performed: Yes SDOH Interventions    Flowsheet Row Office Visit from 02/16/2022 in [place] at [place] Management from 12/30/2021 in [place] at [place] Management from 04/13/2021 in [place] at [place] Interventions Intervention Not Indicated Intervention Not Indicated --  Housing Interventions Intervention Not Indicated Intervention Not Indicated --  Transportation Interventions Intervention Not Indicated Intervention Not Indicated Intervention Not Indicated  Financial Strain Interventions Intervention Not Indicated Intervention Not Indicated Intervention Not Indicated  Physical Activity Interventions Intervention Not Indicated -- --  Stress Interventions Intervention Not Indicated Intervention Not Indicated --  Social Connections Interventions Intervention Not  Indicated -- --       Medication Assistance: None required.  Patient affirms current coverage meets needs.  Medication Access: Within the past 30 days, how often has patient missed a dose of medication? None Is a pillbox or other method used to improve adherence? Yes  Factors that may affect medication adherence? no barriers identified Are meds synced by current pharmacy? Yes  Are meds delivered by current pharmacy? Yes  Does patient experience delays in picking up medications due to transportation concerns? No   [facility] Reviewed: Is patient disadvantaged to use [facility]?: No  Current Rx insurance plan: [facility] Name and location of Current pharmacy:  [facility] - [place], [place] - [address] [place] [address] Phone: [phone] Fax: [phone]  [place], [place] [address] [place] [address] Phone: [phone] Fax: [phone]  [facility] services reviewed with patient today?: No  Patient requests to transfer care to [facility]?: No  Reason patient declined to change pharmacies: Patient is already actively enrolled with [facility]  Compliance/Adherence/Medication fill history: Care Gaps: Urine microalbumin Vaccines: Tdap, Shingles, COVID  Star-Rating Drugs: Atorvastatin '20mg'$  PDC 100% Farxiga '10mg'$  PDC 100%   Assessment/Plan   Diabetes (A1c goal <7%) -Uncontrolled -Current medications: Farxiga '10mg'$  qd Appropriate, Effective, Safe, Accessible Toujeo 22 units into skin daily Appropriate, Effective, Safe, Accessible Humaog MDD 30 units daily Appropriate, Effective, Safe,  Accessible -Medications previously tried: Ozempic  -Current home glucose readings fasting glucose: no access to log at this time post prandial glucose: no access to log at this time -Denies hypoglycemic/hyperglycemic  symptoms -Current meal patterns:  drinks: water -Current exercise: None -Educated on A1c and blood sugar goals; Complications of diabetes including kidney damage, retinal damage, and cardiovascular disease; Benefits of routine self-monitoring of blood sugar; Continuous glucose monitoring; -Counseled to check feet daily and get yearly eye exams -Counseled on diet and exercise extensively Recommended to continue current medication  Atrial Fibrillation (Goal: prevent stroke and major bleeding) -Controlled -CHADSVASC: 6 -Current treatment: Rate control: Metoprolol XL '25mg'$  qd Anticoagulation: Warfarin '5mg'$  1/2 tab qd -Medications previously tried: Amlodipine, Spironolactone, ACEI -Home BP and HR readings: daily, WNL  -Counseled on increased risk of stroke due to Afib and benefits of anticoagulation for stroke prevention; importance of adherence to anticoagulant exactly as prescribed; bleeding risk associated with Warfarin and importance of self-monitoring for signs/symptoms of bleeding; avoidance of NSAIDs due to increased bleeding risk with anticoagulants; -Recommended to continue current medication  [facility] Pharmacist [phone]

## 2022-11-28 NOTE — Telephone Encounter (Signed)
[place] approved with insurance company, pt has $11.20/month copay (is on LIS).    Rx sent, Sandrea Matte will reach out to daughter for injection teaching.

## 2022-11-29 ENCOUNTER — Telehealth: Payer: Self-pay

## 2022-11-29 ENCOUNTER — Telehealth: Payer: Self-pay | Admitting: Cardiovascular Disease

## 2022-11-29 ENCOUNTER — Other Ambulatory Visit (HOSPITAL_COMMUNITY): Payer: Self-pay

## 2022-11-29 NOTE — Telephone Encounter (Signed)
Patient is following up. She states her medication will arrive tomorrow and she would like to know exactly how much vitamin A she needs to be taking. Please advise.

## 2022-11-29 NOTE — Progress Notes (Signed)
Care Management & Coordination Services Pharmacy Team  Reason for Encounter: Appointment Reminder  Unable to contacted patient to confirm telephone appointment with Burman Riis, PharmD on 11/30/2022 at 2:00. Left message on patients home phone.    Do you have any problems getting your medications? If yes what types of problems are you experiencing?   What is your top health concern you would like to discuss at your upcoming visit?   Have you seen any other providers since your last visit with PCP?    Star Rating Drugs:  Atorvastatin 20 mg - last filled 11/16/2022 30 DS at [facility] Farxiga 10 mg - last filled 11/16/2022 30 DS at [facility]: AWV - completed 02/16/2022, scheduled 02/20/2023 Last eye exam / retinopathy screening: 05/18/2022 Last diabetic foot exam: 01/31/2022 Tdap - never done Shingrix - overdue Covid - overdue Urine ACR - overdue  [place] Pharmacist Assistant [phone]

## 2022-11-29 NOTE — Telephone Encounter (Signed)
Called patient, made aware of the instructions.   Patient verbalized understanding. She will let us know if she has any issues

## 2022-11-29 NOTE — Telephone Encounter (Signed)
Pharmacy Patient Advocate Encounter  Received notification from [facility] that the request for prior authorization for Amvuttra has been denied due to .    Please be advised we currently do not have a Pharmacist to review denials, therefore you will need to process appeals accordingly as needed. Thanks for your support at this time.   You may call [phone] or fax [phone], to appeal.    Please see the attach denial letter in the patients chart

## 2022-11-29 NOTE — Telephone Encounter (Signed)
Caller stated Regina [name] will ship today 2/13 and should be delivered to patient tomorrow (2/14).

## 2022-11-29 NOTE — Telephone Encounter (Signed)
Patient was switched to [place], [place] approved and med sent to specialty pharmacy.

## 2022-11-29 NOTE — Telephone Encounter (Signed)
Patient wanted to know the dose of Vitamin A she is supposed to take with [facility]. Please advise.

## 2022-11-30 ENCOUNTER — Ambulatory Visit: Payer: Medicare Other

## 2022-11-30 ENCOUNTER — Telehealth: Payer: Self-pay | Admitting: Cardiovascular Disease

## 2022-11-30 NOTE — Telephone Encounter (Signed)
I contacted patient, she states she tried to  look but was unable to find what she needed. Will you check tomorrow and let me know on Friday and I can call her?   Thank you!

## 2022-11-30 NOTE — Telephone Encounter (Signed)
  Pt c/o medication issue:  1. Name of Medication: 700 units Vitamin A   2. How are you currently taking this medication (dosage and times per day)?   3. Are you having a reaction (difficulty breathing--STAT)? No   4. What is your medication issue? Pt said, the pharmacist told her when she started taking her new medication [place] she needs to take 700 units of vitamin A. She tried looking for vitamin A in the store but theres only 750 -1000 units. She can't take the multi-vitamins since it doesn't have the 700 units of vit A and it has vit K. She asked if Dr. Audie Box can prescribed her 700 units of vit A and [facility]. She will receive her wainua today and hoping can get the vit A prescription right away

## 2022-12-02 ENCOUNTER — Telehealth: Payer: Self-pay | Admitting: Cardiovascular Disease

## 2022-12-02 NOTE — Telephone Encounter (Signed)
Pt c/o medication issue:  1. Name of Medication:   [place]   2. How are you currently taking this medication (dosage and times per day)?   As prescribed  3. Are you having a reaction (difficulty breathing--STAT)?   No  4. What is your medication issue?    Patient stated she is now on [place] as she took her first shot yesterday.  Patient stated she will need a prescription for 700 mg Vitamin A as she has been unable to find this in the drug store.

## 2022-12-02 NOTE — Telephone Encounter (Signed)
Beta Carotene (Vitamin A) is not commonly found as a single product except in health food stores.  Those are usually very high doses and not what our patients need.  She should take a daily MVI which has between 750 and 1000 mcg.  These do have vitimin K, but as long as she takes the same MVI daily, her INR should stabilize fairly quickly (if there is any change at all).

## 2022-12-02 NOTE — Telephone Encounter (Signed)
See notes from 2/13 and 2/14

## 2022-12-02 NOTE — Telephone Encounter (Signed)
Called patient, advised of message below from [facility].  Patient verbalized understanding.

## 2022-12-02 NOTE — Telephone Encounter (Signed)
Routed to clinical pharmacy team as Erasmo Downer [facility] was working on this.

## 2022-12-05 ENCOUNTER — Telehealth: Payer: Self-pay

## 2022-12-05 DIAGNOSIS — E1140 Type 2 diabetes mellitus with diabetic neuropathy, unspecified: Secondary | ICD-10-CM

## 2022-12-05 NOTE — Telephone Encounter (Signed)
Inbound message from pharmacy tech: Pt is having difficulty using her Dexcom and is requesting a change back to freestyle sensors if possible?   Her preferred pharmacy:  [facility] - [place], [place] - [address]  Phone: [phone]  Fax: [phone]

## 2022-12-06 ENCOUNTER — Telehealth: Payer: Self-pay | Admitting: Pharmacist Clinician (PhC)/ Clinical Pharmacy Specialist

## 2022-12-06 ENCOUNTER — Other Ambulatory Visit: Payer: Self-pay

## 2022-12-06 ENCOUNTER — Other Ambulatory Visit (HOSPITAL_COMMUNITY): Payer: Self-pay

## 2022-12-06 DIAGNOSIS — E854 Organ-limited amyloidosis: Secondary | ICD-10-CM

## 2022-12-06 MED ORDER — TAFAMIDIS 61 MG PO CAPS
61.0000 mg | ORAL_CAPSULE | Freq: Every day | ORAL | 12 refills | Status: DC
  Filled 2022-12-06 – 2022-12-07 (×3): qty 30, 30d supply, fill #0
  Filled 2022-12-28: qty 30, 30d supply, fill #1
  Filled 2023-01-25: qty 30, 30d supply, fill #2
  Filled 2023-02-21: qty 30, 30d supply, fill #3
  Filled 2023-03-24: qty 30, 30d supply, fill #4
  Filled 2023-04-26: qty 30, 30d supply, fill #5
  Filled 2023-05-26: qty 30, 30d supply, fill #6
  Filled 2023-06-26: qty 30, 30d supply, fill #7
  Filled 2023-07-19: qty 30, 30d supply, fill #8
  Filled 2023-08-21: qty 30, 30d supply, fill #9
  Filled 2023-09-21: qty 30, 30d supply, fill #10

## 2022-12-06 MED ORDER — FREESTYLE LIBRE 2 SENSOR MISC: 3 refills | Status: DC

## 2022-12-06 NOTE — Telephone Encounter (Signed)
Freestyle sensor 2 sent

## 2022-12-06 NOTE — Telephone Encounter (Signed)
[place] for [employer] approved $10,000 until 10/26/23  Will send prescription to [place]

## 2022-12-07 ENCOUNTER — Other Ambulatory Visit: Payer: Self-pay

## 2022-12-07 ENCOUNTER — Other Ambulatory Visit: Payer: Self-pay | Admitting: Cardiovascular Disease

## 2022-12-07 ENCOUNTER — Other Ambulatory Visit (HOSPITAL_COMMUNITY): Payer: Self-pay

## 2022-12-07 ENCOUNTER — Other Ambulatory Visit: Payer: Self-pay | Admitting: Family Medicine

## 2022-12-07 DIAGNOSIS — K219 Gastro-esophageal reflux disease without esophagitis: Secondary | ICD-10-CM

## 2022-12-08 ENCOUNTER — Other Ambulatory Visit: Payer: Self-pay

## 2022-12-08 ENCOUNTER — Telehealth: Payer: Self-pay

## 2022-12-08 NOTE — Progress Notes (Signed)
Care Management & Coordination Services Pharmacy Team  Reason for Encounter: Medication coordination and delivery  Contacted patient to discuss medications and coordinate delivery from [facility]. Unsuccessful outreach. Left voicemail for patient to return call. Multiple attempts  Cycle dispensing form sent to Burman Riis for review.   Last adherence delivery date:11/18/2022      Patient is due for next adherence delivery on: 12/20/2022  This delivery to include: Adherence Packaging  30 Days  Warfarin 5 mg tablet (easy top vials) use as directed Allopurinol 100 mg 1 tablet at breakfast Klor-Con 20 meq - 1 tablet at breakfast Protonix 20 mg - 1 tablet at breakfast Aspirin 81 mg - 1 tablet at breakfast Magnesium oxide 400 mg 1 tablet at breakfast Cetirizine 10 mg 1 tablet at breakfast (OTC med) Atorvastatin 20 mg 1 tablet at bedtime Farxiga 10 mg 1 tablet at breakfast Metoprolol succinate 25 mg 1 tablet at breakfast Gabapentin 600 mg tablet  1 tablet at breakfast and 1 tablet at bedtime Levothyroxine 150 mcg 1 tablet before breakfast Torsemide 20 mg take 2 tablets at breakfast Pen needles 32 g X 4 mm  Humalog kwikpen 100 un - inject 30 units max dose daily  Patient declined the following medications this month: Dexcom G6 sensors last filled 10/03/22 90 DS  Toujeo solostar 300 un - inject 22 units daily (next fill date 01/16/23) Nitrostat 0.'4mg'$  - 1 tablet every 5 min as needed   Fluocinolone Acetonide 0.01 % OIL (ear drop) Ketoconazole (NIZORAL) 2 % shampoo  Delivery scheduled for 12/20/2022. Unable to speak with patient to confirm date.   Any concerns about your medications?   How often do you forget or accidentally miss a dose?   Is patient in packaging Yes  If yes  What is the date on your next pill pack?  Any concerns or issues with your packaging?   Chart review: Recent office visits:  None  Recent consult visits:  None  Hospital visits:   None  Medications: Outpatient Encounter Medications as of 12/08/2022  Medication Sig   acetaminophen (TYLENOL) 500 MG tablet Take 1,000 mg by mouth every 6 (six) hours as needed for mild pain.   Alcohol Swabs (B-D SINGLE USE SWABS REGULAR) PADS Use to test blood sugar up to 3 times daily   allopurinol (ZYLOPRIM) 100 MG tablet TAKE ONE TABLET BY MOUTH EVERY MORNING   aspirin EC 81 MG EC tablet Take 1 tablet (81 mg total) by mouth daily. Swallow whole.   atorvastatin (LIPITOR) 20 MG tablet TAKE ONE TABLET BY MOUTH EVERYDAY AT BEDTIME   benzonatate (TESSALON) 200 MG capsule Take 1 capsule (200 mg total) by mouth every 6 (six) hours as needed for cough.   Blood Glucose Monitoring Suppl (ACCU-CHEK GUIDE) w/Device KIT 1 Device by Does not apply route 3 (three) times daily.   Cholecalciferol (VITAMIN D3) 50 MCG (2000 UT) capsule Take 2,000 Units by mouth daily.   Continuous Blood Gluc Receiver (DEXCOM G7 RECEIVER) DEVI 1 Device by Does not apply route as directed.   Continuous Blood Gluc Sensor (FREESTYLE LIBRE 2 SENSOR) MISC Change  sensors every 14 days   Continuous Blood Gluc Transmit (DEXCOM G6 TRANSMITTER) MISC 1 Device by Does not apply route as directed.   Eplontersen Sodium (WAINUA) 45 MG/0.8ML SOAJ Inject 45 mg into the skin every 30 (thirty) days.   famotidine (PEPCID) 20 MG tablet Take 1 tablet (20 mg total) by mouth at bedtime.   FARXIGA 10 MG TABS tablet TAKE  ONE TABLET BY MOUTH ONCE DAILY   Fluocinolone Acetonide 0.01 % OIL instill one drop in ears every 2-3 DAYS AS NEEDED FOR pruritis   gabapentin (NEURONTIN) 600 MG tablet TAKE ONE TABLET BY MOUTH EVERY MORNING and TAKE ONE TABLET BY MOUTH EVERYDAY AT BEDTIME   glucose blood (ACCU-CHEK GUIDE) test strip Use to test blood sugar up to 3 times daily   insulin glargine, 1 Unit Dial, (TOUJEO SOLOSTAR) 300 UNIT/ML Solostar Pen Inject 22 Units into the skin daily. Eat a snack with protein nightly before bedtime.   insulin lispro (HUMALOG  KWIKPEN) 100 UNIT/ML KwikPen Max daily 30 units   Insulin Pen Needle 32G X 4 MM MISC 1 Device by Does not apply route in the morning, at noon, in the evening, and at bedtime.   ipratropium (ATROVENT) 0.06 % nasal spray Place 2 sprays into both nostrils 4 (four) times daily. (Patient taking differently: Place 2 sprays into both nostrils 4 (four) times daily. As needed)   ketoconazole (NIZORAL) 2 % shampoo Apply 1 application. topically 2 (two) times a week.   Lancets Misc. (ACCU-CHEK FASTCLIX LANCET) KIT Use to test blood sugar up to 3 times daily   levothyroxine (SYNTHROID) 150 MCG tablet TAKE ONE TABLET BY MOUTH BEFORE BREAKFAST   magnesium oxide (MAG-OX) 400 MG tablet TAKE ONE TABLET BY MOUTH EVERY MORNING   metoprolol succinate (TOPROL-XL) 25 MG 24 hr tablet TAKE ONE TABLET BY MOUTH ONCE DAILY with OR immedaitely following A meal   Multiple Vitamin (MULTIVITAMIN) tablet Take 1 tablet by mouth daily. K FREE DAILY once daily (MVI with no vitamin K)   nitroGLYCERIN (NITROSTAT) 0.4 MG SL tablet Dissolve 1 tab under tongue as needed for chest pain. May repeat every 5 minutes x 2 doses. If no relief call 9-1-1.   pantoprazole (PROTONIX) 20 MG tablet TAKE ONE TABLET BY MOUTH ONCE DAILY   Polyethylene Glycol 3350 (MIRALAX PO) Take 1 Package by mouth daily as needed (constipation).   potassium chloride SA (KLOR-CON M) 20 MEQ tablet TAKE ONE TABLET BY MOUTH ONCE DAILY   Propylene Glycol (SYSTANE BALANCE OP) Place 1 drop into both eyes daily as needed (for dry eyes).   SALINE MIST SPRAY NA Place 2 sprays into the nose at bedtime.   Tafamidis 61 MG CAPS Take 1 capsule (61 mg total) by mouth daily.   torsemide (DEMADEX) 20 MG tablet Take 2 tablets (40 mg total) by mouth daily.   trolamine salicylate (ASPERCREME) 10 % cream Apply 1 application. topically as needed for muscle pain.   warfarin (COUMADIN) 5 MG tablet TAKE 1/2 TABLET BY MOUTH ONCE DAILY OR AS DIRECTED BY COUMADIN CLINIC   No  facility-administered encounter medications on file as of 12/08/2022.   BP Readings from Last 3 Encounters:  10/14/22 124/70  10/04/22 124/78  09/19/22 120/80    Pulse Readings from Last 3 Encounters:  10/14/22 91  10/04/22 95  09/19/22 86    Lab Results  Component Value Date/Time   HGBA1C 7.5 (A) 08/01/2022 01:34 PM   HGBA1C 7.3 (A) 01/31/2022 01:56 PM   HGBA1C 7.9 (H) 08/11/2021 08:15 AM   HGBA1C 7.2 (H) 06/18/2021 11:10 AM   Lab Results  Component Value Date   CREATININE 2.2 (A) 05/17/2022   BUN 46 (A) 05/17/2022   GFR 24.47 (L) 08/13/2021   GFRNONAA 23 (L) 01/12/2022   GFRAA 26 10/20/2021   NA 138 05/17/2022   K 4.1 05/17/2022   CALCIUM 9.2 05/17/2022   CO2 31 (  A) 05/17/2022   [place] Pharmacist Assistant [phone]

## 2022-12-12 MED ORDER — FREESTYLE LIBRE 2 READER DEVI: 0 refills | Status: DC

## 2022-12-12 NOTE — Telephone Encounter (Signed)
Inbound message from pharmacy tech Pt is having issue using her phone device, can a prescription for freestyle libre 2 reading also be sent in?  Rx sent to pharmacy.

## 2022-12-12 NOTE — Addendum Note (Signed)
Addended by: Lauralyn Primes on: 12/12/2022 04:42 PM   Modules accepted: Orders

## 2022-12-15 ENCOUNTER — Telehealth: Payer: Self-pay | Admitting: Cardiovascular Disease

## 2022-12-15 NOTE — Telephone Encounter (Signed)
Called patient to discuss symptoms. Patient denies SOB, endorses fatigue, states she has been elevating her legs and restricting her salt. Legs were noted to have 1+ pitting edema at visit w/ PCP on 09/20/23. Patient states she thinks she has gained 3 lbs in one week and is concerned her swelling seems to be increasing in her feet and hands. Cr on labs from outside office visit 11/16/22 show a Cr of 2.26, last BMP with [place] was 05/17/22 and was 2.2. Put patient on DOD schedule for tomorrow 12/16/22 with Dr. Angelena Form.

## 2022-12-15 NOTE — Telephone Encounter (Signed)
Pt c/o swelling: STAT is pt has developed SOB within 24 hours  If swelling, where is the swelling located? Swelling on both ankles, she states her hands look a little puffy   How much weight have you gained and in what time span? 3lbs in the past week  Have you gained 3 pounds in a day or 5 pounds in a week? no  Do you have a log of your daily weights (if so, list)? no  Are you currently taking a fluid pill? yes   Are you currently SOB? no  Have you traveled recently? No She is wondering if she can get a script for extra fluid pills, she would like it sent to [facility].  She gets her medication prepackaged so she doesn't have extra fluid pills.

## 2022-12-16 ENCOUNTER — Encounter: Payer: Self-pay | Admitting: Cardiovascular Disease

## 2022-12-16 ENCOUNTER — Ambulatory Visit: Payer: Medicare Other | Attending: Cardiology | Admitting: *Deleted

## 2022-12-16 ENCOUNTER — Ambulatory Visit (INDEPENDENT_AMBULATORY_CARE_PROVIDER_SITE_OTHER): Payer: Medicare Other | Admitting: Cardiovascular Disease

## 2022-12-16 VITALS — BP 148/80 | HR 86 | Ht 62.0 in | Wt 221.2 lb

## 2022-12-16 DIAGNOSIS — I2510 Atherosclerotic heart disease of native coronary artery without angina pectoris: Secondary | ICD-10-CM

## 2022-12-16 DIAGNOSIS — I4819 Other persistent atrial fibrillation: Secondary | ICD-10-CM

## 2022-12-16 DIAGNOSIS — I4891 Unspecified atrial fibrillation: Secondary | ICD-10-CM

## 2022-12-16 DIAGNOSIS — Z7901 Long term (current) use of anticoagulants: Secondary | ICD-10-CM | POA: Diagnosis not present

## 2022-12-16 DIAGNOSIS — I5023 Acute on chronic systolic (congestive) heart failure: Secondary | ICD-10-CM

## 2022-12-16 LAB — POCT INR: INR: 2 (ref 2.0–3.0)

## 2022-12-16 MED ORDER — TORSEMIDE 20 MG PO TABS: ORAL_TABLET | ORAL | 0 refills | Status: DC

## 2022-12-16 NOTE — Patient Instructions (Signed)
Description   Today take 1 tablet of warfarin then continue taking warfarin 1/2 tablet daily. Repeat INR in 3 weeks; Antico[facility] Clinic [phone]

## 2022-12-16 NOTE — Patient Instructions (Signed)
Medication Instructions:  Double torsemide as discuss for 3 days  *If you need a refill on your cardiac medications before your next appointment, please call your pharmacy*   Lab Work: none If you have labs (blood work) drawn today and your tests are completely normal, you will receive your results only by: [place] (if you have MyChart) OR A paper copy in the mail If you have any lab test that is abnormal or we need to change your treatment, we will call you to review the results.   Testing/Procedures: none   Follow-Up: As planned

## 2022-12-16 NOTE — Progress Notes (Signed)
Chief Complaint  Patient presents with   Follow-up    Dyspnea   History of Present Illness: 82 yo female with history with non-ischemic cardiomyopathy, cardiac amyloid, chronic systolic CHF, persistent atrial fibrillation, diabetes, CKD stage 4, HLD, HTN, CAD, sleep apnea, prior CVA, PMR who is added onto my schedule today for unplanned follow up. She is followed in our office by Dr. Audie Box. She has persistent atrial fibrillation and is on coumadin therapy. She has several failed attempts at cardioversion. She has mild to moderate CAD by cardiac cath in 2016. She has cardiac amyloid and is on medical therapy for this. Last echo March 2023 with LVEF=40-45%, mild MR. She called our office yesterday with c/o increased lower extremity edema. No chest pain or dyspnea. She endorses abdominal fullness, dyspnea and LE edema. She has been taking Torsemide 40 mg daily. Her weight has been stable at home.   Primary Care Physician: [name], [name], MD  Primary Cardiology: Dr. 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 01/2015 Cardiolite: + inf wall ischemia, EF 56%;  b. 01/2015 Cath: LM nl, LAD 2m LCX min irregs, RCA dominant, 50-661m  Sleep apnea    Swallowing difficulty     Past Surgical History:  Procedure Laterality Date   ABDOMINAL HYSTERECTOMY     BUBBLE STUDY  07/01/2020   Procedure: BUBBLE STUDY;  Surgeon: AcElouise MunroeMD;  [facility];;   BUBBLE STUDY  08/05/2020   Procedure: BUBBLE STUDY;  Surgeon: O'Geralynn RileMD;  [facility]:  Cardiovascular;;  done with definity    CARDIAC CATHETERIZATION     ESOPHAGOGASTRODUODENOSCOPY     a. 01/2015 EGD: patent esophagus.   ESOPHAGOGASTRODUODENOSCOPY N/A 01/20/2015   Procedure: ESOPHAGOGASTRODUODENOSCOPY (EGD);  Surgeon: PaCarol AdaMD;  [facility];  Service: Endoscopy;  Laterality: N/A;   HAND SURGERY     JOINT REPLACEMENT     reports history bilateral TKA and right TSA   LEFT HEART CATHETERIZATION WITH CORONARY ANGIOGRAM N/A 01/19/2015   RIGHT HEART CATH N/A 12/27/2021   Procedure: RIGHT HEART CATH;  Surgeon: BeLorretta HarpMD;  [facility];  Service: Cardiovascular;  Laterality: N/A;   TEE WITHOUT CARDIOVERSION  05/04/2020   TEE WITHOUT CARDIOVERSION N/A 05/05/2020   Procedure: TRANSESOPHAGEAL ECHOCARDIOGRAM (TEE);  Surgeon: HiPixie CasinoMD;  [facility];  Service: Cardiovascular;  Laterality: N/A;   TEE WITHOUT CARDIOVERSION N/A 07/01/2020   Procedure: TRANSESOPHAGEAL ECHOCARDIOGRAM (TEE);  Surgeon: AcElouise MunroeMD;  [facility]: Cardi[facility];  Laterality: N/A;   TEE WITHOUT CARDIOVERSION N/A 08/05/2020   Procedure: TRANSESOPHAGEAL ECHOCARDIOGRAM (TEE);  Surgeon: O'Geralynn RileMD;  [facility];  Laterality: N/A;   THYROIDECTOMY      Current Outpatient Medications  Medication Sig Dispense Refill   acetaminophen (TYLENOL) 500 MG tablet Take 1,000 mg by mouth every 6 (six) hours as needed for mild pain.     Alcohol  Swabs (B-D SINGLE USE SWABS REGULAR) PADS Use to test blood sugar up to 3 times daily 100 each 3   allopurinol (ZYLOPRIM) 100 MG tablet TAKE ONE TABLET BY MOUTH EVERY MORNING 90 tablet 1   aspirin EC 81 MG EC tablet Take 1 tablet (81 mg total) by mouth daily. Swallow whole. 30 tablet 11   atorvastatin (LIPITOR) 20 MG tablet TAKE ONE TABLET BY MOUTH EVERYDAY AT BEDTIME 90 tablet 2   benzonatate (TESSALON) 200 MG capsule Take 1 capsule (200 mg  total) by mouth every 6 (six) hours as needed for cough. 40 capsule 0   Blood Glucose Monitoring Suppl (ACCU-CHEK GUIDE) w/Device KIT 1 Device by Does not apply route 3 (three) times daily. 1 kit 0   Cholecalciferol (VITAMIN D3) 50 MCG (2000 UT) capsule Take 2,000 Units by mouth daily.     Continuous Blood Gluc Receiver (FREESTYLE LIBRE 2 READER) DEVI Use as instructed to check blood sugar. 1 each 0   Continuous Blood Gluc Sensor (FREESTYLE LIBRE 2 SENSOR) MISC Change  sensors every 14 days 6 each 3   Continuous Blood Gluc Transmit (DEXCOM G6 TRANSMITTER) MISC 1 Device by Does not apply route as directed. 1 each 3   Eplontersen Sodium (WAINUA) 45 MG/0.8ML SOAJ Inject 45 mg into the skin every 30 (thirty) days. 0.8 mL 12   famotidine (PEPCID) 20 MG tablet Take 1 tablet (20 mg total) by mouth at bedtime. 90 tablet 1   FARXIGA 10 MG TABS tablet TAKE ONE TABLET BY MOUTH ONCE DAILY 90 tablet 3   Fluocinolone Acetonide 0.01 % OIL instill one drop in ears every 2-3 DAYS AS NEEDED FOR pruritis 20 mL 1   gabapentin (NEURONTIN) 600 MG tablet TAKE ONE TABLET BY MOUTH EVERY MORNING and TAKE ONE TABLET BY MOUTH EVERYDAY AT BEDTIME 60 tablet 3   glucose blood (ACCU-CHEK GUIDE) test strip Use to test blood sugar up to 3 times daily 100 each 3   insulin glargine, 1 Unit Dial, (TOUJEO SOLOSTAR) 300 UNIT/ML Solostar Pen Inject 22 Units into the skin daily. Eat a snack with protein nightly before bedtime. 15 mL 6   insulin lispro (HUMALOG KWIKPEN) 100 UNIT/ML KwikPen Max daily 30 units 30 mL 3   Insulin Pen Needle 32G X 4 MM MISC 1 Device by Does not apply route in the morning, at noon, in the evening, and at bedtime. 400 each 3   ipratropium (ATROVENT) 0.06 % nasal spray Place 2 sprays into both nostrils 4 (four) times daily. (Patient taking differently: Place 2 sprays into both nostrils 4 (four) times daily. As needed) 15 mL 3   ketoconazole (NIZORAL) 2 % shampoo Apply 1 application. topically 2 (two) times a week.  120 mL 2   Lancets Misc. (ACCU-CHEK FASTCLIX LANCET) KIT Use to test blood sugar up to 3 times daily 1 kit 1   levothyroxine (SYNTHROID) 150 MCG tablet TAKE ONE TABLET BY MOUTH BEFORE BREAKFAST 90 tablet 3   magnesium oxide (MAG-OX) 400 MG tablet TAKE ONE TABLET BY MOUTH EVERY MORNING 90 tablet 1   metoprolol succinate (TOPROL-XL) 25 MG 24 hr tablet TAKE ONE TABLET BY MOUTH ONCE DAILY with OR immedaitely following A meal 90 tablet 3   Multiple Vitamin (MULTIVITAMIN) tablet Take 1 tablet by mouth daily. K FREE DAILY once daily (MVI with no vitamin K)     nitroGLYCERIN (NITROSTAT) 0.4 MG SL tablet Dissolve 1 tab under tongue as needed for chest pain. May repeat every 5 minutes x  2 doses. If no relief call 9-1-1. 25 tablet 2   pantoprazole (PROTONIX) 20 MG tablet TAKE ONE TABLET BY MOUTH ONCE DAILY 30 tablet 2   Polyethylene Glycol 3350 (MIRALAX PO) Take 1 Package by mouth daily as needed (constipation).     potassium chloride SA (KLOR-CON M) 20 MEQ tablet TAKE ONE TABLET BY MOUTH ONCE DAILY 90 tablet 1   Propylene Glycol (SYSTANE BALANCE OP) Place 1 drop into both eyes daily as needed (for dry eyes).     SALINE MIST SPRAY NA Place 2 sprays into the nose at bedtime.     Tafamidis 61 MG CAPS Take 1 capsule (61 mg total) by mouth daily. 30 capsule 12   torsemide (DEMADEX) 20 MG tablet Take 2 tablets (40 mg total) by mouth daily. 180 tablet 1   torsemide (DEMADEX) 20 MG tablet Take as directed 30 tablet 0   trolamine salicylate (ASPERCREME) 10 % cream Apply 1 application. topically as needed for muscle pain.     warfarin (COUMADIN) 5 MG tablet TAKE 1/2 TABLET BY MOUTH ONCE DAILY OR AS DIRECTED BY COUMADIN CLINIC 50 tablet 1   No current facility-administered medications for this visit.    Allergies  Allergen Reactions   Ace Inhibitors Other (See Comments)    unknown   Amlodipine Other (See Comments)    unknown   Atenolol Other (See Comments)    bradycardia   Avandia [Rosiglitazone] Other (See  Comments)    unknown   Darvon [Propoxyphene] Other (See Comments)    unknown   Erythromycin Itching   Hydralazine Other (See Comments)    Burning in throat and chest   Hydrocodone Other (See Comments)    Hallucinations.   Levofloxacin Itching   Morphine And Related Other (See Comments)    Dizzy and hallucianation, vomiting; Willing to try low dose   Percocet [Oxycodone-Acetaminophen] Other (See Comments)    hallucination   Spironolactone Other (See Comments)    unknown   Tramadol Other (See Comments)    Unknown/does not recall reaction but does not want to take again    Social History   Socioeconomic History   Marital status: Widowed    Spouse name: Not on file   Number of children: Not on file   Years of education: Not on file   Highest education level: Not on file  Occupational History   Occupation: Retired  Tobacco Use   Smoking status: Never   Smokeless tobacco: Never  Vaping Use   Vaping Use: Never used  Substance and Sexual Activity   Alcohol use: No   Drug use: Never   Sexual activity: Not Currently  Other Topics Concern   Not on file  Social History Narrative   Lives with daughter   Social Determinants of Health   Financial Resource Strain: Low Risk  (11/30/2022)   Overall Financial Resource Strain (CARDIA)    Difficulty of Paying Living Expenses: Not very hard  Food Insecurity: No Food Insecurity (11/30/2022)   Hunger Vital Sign    Worried About Running Out of Food in the Last Year: Never true    Ran Out of Food in the Last Year: Never true  Transportation Needs: No Transportation Needs (11/30/2022)   PRAPARE - Hydrologist (Medical): No    Lack of Transportation (Non-Medical): No  Physical Activity: Inactive (02/16/2022)   Exercise Vital Sign    Days of Exercise per Week: 0 days    Minutes of Exercise per Session:  0 min  Stress: No Stress Concern Present (02/16/2022)   [place]    Feeling of Stress : Not at all  Social Connections: Moderately Integrated (02/16/2022)   Social Connection and Isolation Panel [NHANES]    Frequency of Communication with Friends and Family: More than three times a week    Frequency of Social Gatherings with Friends and Family: More than three times a week    Attends Religious Services: More than 4 times per year    Active Member of [employer] or Organizations: Yes    Attends Archivist Meetings: More than 4 times per year    Marital Status: Widowed  Intimate Partner Violence: Not At Risk (02/16/2022)   Humiliation, Afraid, Rape, and Kick questionnaire    Fear of Current or Ex-Partner: No    Emotionally Abused: No    Physically Abused: No    Sexually Abused: No    Family History  Problem Relation Age of Onset   Heart disease Mother    Hypertension Mother    Cancer Father    Alcoholism Father     Review of Systems:  As stated in the HPI and otherwise negative.   BP (!) 148/80   Pulse 86   Ht '5\' 2"'$  (1.575 m)   Wt 100.3 kg   SpO2 99%   BMI 40.46 kg/m   Physical Examination: General: Well developed, well nourished, NAD  HEENT: OP clear, mucus membranes moist  SKIN: warm, dry. No rashes. Neuro: No focal deficits  Musculoskeletal: Muscle strength 5/5 all ext  Psychiatric: Mood and affect normal  Neck: No JVD, no carotid bruits, no thyromegaly, no lymphadenopathy.  Lungs:Clear bilaterally, no wheezes, rhonci, crackles Cardiovascular: Regular rate and rhythm. No murmurs, gallops or rubs. Abdomen:Soft. Bowel sounds present. Non-tender.  Extremities: Trace bilateral lower extremity edema.   EKG:  EKG is ordered today. The ekg ordered today demonstrates Atrial flutter, rate 86 bpm. Non-specific ST and T wave abn  Recent Labs: 12/28/2021: Magnesium 2.4 01/12/2022: B Natriuretic Peptide 420.4 05/17/2022: BUN 46; Creatinine 2.2; Potassium 4.1; Sodium 138 08/31/2022: Hemoglobin 12.2; Platelets 282.0    Lipid Panel    Component Value Date/Time   CHOL 115 08/13/2021 1501   CHOL 117 03/24/2020 1428   TRIG 101.0 08/13/2021 1501   HDL 54.80 08/13/2021 1501   HDL 56 03/24/2020 1428   CHOLHDL 2 08/13/2021 1501   VLDL 20.2 08/13/2021 1501   LDLCALC 40 08/13/2021 1501   LDLCALC 49 03/24/2020 1428     Wt Readings from Last 3 Encounters:  12/16/22 100.3 kg  10/14/22 108.9 kg  10/04/22 101.4 kg     Assessment and Plan:   1. Acute on chronic systolic CHF: Her weight is overall stable. I think the 108.9 kg documented on 10/14/22 was an error. Her weight at home is unchanged over the past month. Her main complaint today is feeling abdominal fullness, dyspnea and mild LE edema. She thinks her LE edema is slightly better today. I will increase her Torsemide to 40 mg po BID for 3 days then resume 40 mg per day.   2. CAD without angina: No chest pain suggestive of angina. Continue current therapy including ASA, statin and Toprol.   Labs/ tests ordered today include:   Orders Placed This Encounter  Procedures   EKG 12-Lead   Disposition:   F/U with Dr. Audie Box as scheduled in three weeks    Signed, Lauree Chandler, MD, [facility] 12/16/2022  2:58 PM    [facility] [address], [place], [place]  [address] Phone: [phone]; Fax: [phone]

## 2022-12-19 ENCOUNTER — Ambulatory Visit: Payer: Medicare Other | Admitting: Family Medicine

## 2022-12-19 NOTE — Progress Notes (Deleted)
ACUTE VISIT No chief complaint on file.  HPI: [name] is a 82 y.o. female, who is here today complaining of *** HPI  Review of Systems See other pertinent positives and negatives in HPI.  Current Outpatient Medications on File Prior to Visit  Medication Sig Dispense Refill   acetaminophen (TYLENOL) 500 MG tablet Take 1,000 mg by mouth every 6 (six) hours as needed for mild pain.     Alcohol Swabs (B-D SINGLE USE SWABS REGULAR) PADS Use to test blood sugar up to 3 times daily 100 each 3   allopurinol (ZYLOPRIM) 100 MG tablet TAKE ONE TABLET BY MOUTH EVERY MORNING 90 tablet 1   aspirin EC 81 MG EC tablet Take 1 tablet (81 mg total) by mouth daily. Swallow whole. 30 tablet 11   atorvastatin (LIPITOR) 20 MG tablet TAKE ONE TABLET BY MOUTH EVERYDAY AT BEDTIME 90 tablet 2   benzonatate (TESSALON) 200 MG capsule Take 1 capsule (200 mg total) by mouth every 6 (six) hours as needed for cough. 40 capsule 0   Blood Glucose Monitoring Suppl (ACCU-CHEK GUIDE) w/Device KIT 1 Device by Does not apply route 3 (three) times daily. 1 kit 0   Cholecalciferol (VITAMIN D3) 50 MCG (2000 UT) capsule Take 2,000 Units by mouth daily.     Continuous Blood Gluc Receiver (FREESTYLE LIBRE 2 READER) DEVI Use as instructed to check blood sugar. 1 each 0   Continuous Blood Gluc Sensor (FREESTYLE LIBRE 2 SENSOR) MISC Change  sensors every 14 days 6 each 3   Continuous Blood Gluc Transmit (DEXCOM G6 TRANSMITTER) MISC 1 Device by Does not apply route as directed. 1 each 3   Eplontersen Sodium (WAINUA) 45 MG/0.8ML SOAJ Inject 45 mg into the skin every 30 (thirty) days. 0.8 mL 12   famotidine (PEPCID) 20 MG tablet Take 1 tablet (20 mg total) by mouth at bedtime. 90 tablet 1   FARXIGA 10 MG TABS tablet TAKE ONE TABLET BY MOUTH ONCE DAILY 90 tablet 3   Fluocinolone Acetonide 0.01 % OIL instill one drop in ears every 2-3 DAYS AS NEEDED FOR pruritis 20 mL 1   gabapentin (NEURONTIN) 600 MG tablet TAKE ONE TABLET  BY MOUTH EVERY MORNING and TAKE ONE TABLET BY MOUTH EVERYDAY AT BEDTIME 60 tablet 3   glucose blood (ACCU-CHEK GUIDE) test strip Use to test blood sugar up to 3 times daily 100 each 3   insulin glargine, 1 Unit Dial, (TOUJEO SOLOSTAR) 300 UNIT/ML Solostar Pen Inject 22 Units into the skin daily. Eat a snack with protein nightly before bedtime. 15 mL 6   insulin lispro (HUMALOG KWIKPEN) 100 UNIT/ML KwikPen Max daily 30 units 30 mL 3   Insulin Pen Needle 32G X 4 MM MISC 1 Device by Does not apply route in the morning, at noon, in the evening, and at bedtime. 400 each 3   ipratropium (ATROVENT) 0.06 % nasal spray Place 2 sprays into both nostrils 4 (four) times daily. (Patient taking differently: Place 2 sprays into both nostrils 4 (four) times daily. As needed) 15 mL 3   ketoconazole (NIZORAL) 2 % shampoo Apply 1 application. topically 2 (two) times a week. 120 mL 2   Lancets Misc. (ACCU-CHEK FASTCLIX LANCET) KIT Use to test blood sugar up to 3 times daily 1 kit 1   levothyroxine (SYNTHROID) 150 MCG tablet TAKE ONE TABLET BY MOUTH BEFORE BREAKFAST 90 tablet 3   magnesium oxide (MAG-OX) 400 MG tablet TAKE ONE TABLET BY MOUTH EVERY MORNING  90 tablet 1   metoprolol succinate (TOPROL-XL) 25 MG 24 hr tablet TAKE ONE TABLET BY MOUTH ONCE DAILY with OR immedaitely following A meal 90 tablet 3   Multiple Vitamin (MULTIVITAMIN) tablet Take 1 tablet by mouth daily. K FREE DAILY once daily (MVI with no vitamin K)     nitroGLYCERIN (NITROSTAT) 0.4 MG SL tablet Dissolve 1 tab under tongue as needed for chest pain. May repeat every 5 minutes x 2 doses. If no relief call 9-1-1. 25 tablet 2   pantoprazole (PROTONIX) 20 MG tablet TAKE ONE TABLET BY MOUTH ONCE DAILY 30 tablet 2   Polyethylene Glycol 3350 (MIRALAX PO) Take 1 Package by mouth daily as needed (constipation).     potassium chloride SA (KLOR-CON M) 20 MEQ tablet TAKE ONE TABLET BY MOUTH ONCE DAILY 90 tablet 1   Propylene Glycol (SYSTANE BALANCE OP) Place 1  drop into both eyes daily as needed (for dry eyes).     SALINE MIST SPRAY NA Place 2 sprays into the nose at bedtime.     Tafamidis 61 MG CAPS Take 1 capsule (61 mg total) by mouth daily. 30 capsule 12   torsemide (DEMADEX) 20 MG tablet Take 2 tablets (40 mg total) by mouth daily. 180 tablet 1   torsemide (DEMADEX) 20 MG tablet Take as directed 30 tablet 0   trolamine salicylate (ASPERCREME) 10 % cream Apply 1 application. topically as needed for muscle pain.     warfarin (COUMADIN) 5 MG tablet TAKE 1/2 TABLET BY MOUTH ONCE DAILY OR AS DIRECTED BY COUMADIN CLINIC 50 tablet 1   No current facility-administered medications on file prior to visit.    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 01/2015 Cardiolite: + inf wall ischemia, EF 56%;  b. 01/2015 Cath: LM nl, LAD 70m LCX min irregs, RCA dominant, 50-627m  Sleep apnea    Swallowing difficulty    Allergies  Allergen Reactions   Ace Inhibitors Other (See Comments)    unknown   Amlodipine Other (See Comments)    unknown   Atenolol Other (See Comments)    bradycardia   Avandia [Rosiglitazone] Other (See Comments)    unknown   Darvon [Propoxyphene] Other (See Comments)    unknown   Erythromycin Itching   Hydralazine Other (See Comments)    Burning in throat and chest   Hydrocodone Other (See Comments)    Hallucinations.   Levofloxacin Itching   Morphine And Related Other (See Comments)    Dizzy and hallucianation, vomiting; Willing to try low dose   Percocet [Oxycodone-Acetaminophen] Other (See Comments)    hallucination   Spironolactone Other (See  Comments)    unknown   Tramadol Other (See Comments)    Unknown/does not recall reaction but does not want to take again    Social History   Socioeconomic History   Marital status: Widowed    Spouse name: Not on file   Number of children: Not on file   Years of education: Not on file   Highest education  level: Not on file  Occupational History   Occupation: Retired  Tobacco Use   Smoking status: Never   Smokeless tobacco: Never  Vaping Use   Vaping Use: Never used  Substance and Sexual Activity   Alcohol use: No   Drug use: Never   Sexual activity: Not Currently  Other Topics Concern   Not on file  Social History Narrative   Lives with daughter   Social Determinants of Health   Financial Resource Strain: Low Risk  (11/30/2022)   Overall Financial Resource Strain (CARDIA)    Difficulty of Paying Living Expenses: Not very hard  Food Insecurity: No Food Insecurity (11/30/2022)   Hunger Vital Sign    Worried About Running Out of Food in the Last Year: Never true    Ran Out of Food in the Last Year: Never true  Transportation Needs: No Transportation Needs (11/30/2022)   PRAPARE - Hydrologist (Medical): No    Lack of Transportation (Non-Medical): No  Physical Activity: Inactive (02/16/2022)   Exercise Vital Sign    Days of Exercise per Week: 0 days    Minutes of Exercise per Session: 0 min  Stress: No Stress Concern Present (02/16/2022)   [place]    Feeling of Stress : Not at all  Social Connections: Moderately Integrated (02/16/2022)   Social Connection and Isolation Panel [NHANES]    Frequency of Communication with Friends and Family: More than three times a week    Frequency of Social Gatherings with Friends and Family: More than three times a week    Attends Religious Services: More than 4 times per year    Active Member of [employer] or Organizations: Yes    Attends Theatre manager Meetings: More than 4 times per year    Marital Status: Widowed    There were no vitals filed for this visit. There is no height or weight on file to calculate BMI.  Physical Exam  ASSESSMENT AND PLAN: There are no diagnoses linked to this encounter.  No follow-ups on file.  [name]. [name], MD  [facility]. [place] office.  Discharge Instructions   None

## 2022-12-19 NOTE — Progress Notes (Signed)
ACUTE VISIT Chief Complaint  Patient presents with   Arm Injury    Left arm, hit her arm on the wall and it knocked her glucose sensor off.    HPI: [name] is a 82 y.o. female with PMHx significant for CAD,CKD III,GERD,HTN, hypothyroidism, DM II, CHF, atrial fib on chronic anticoagulation (coumadin), and amyloidosis  here today with her daughter complaining of irritation of lesion on left arm when she accidentally knocked off her continuous glucose monitor device from arm about 6 days ago.  No changes in left shoulder ROM. Pain has improved.  DM II, last visit with the endocrinologist was in 07/2022, and her last recorded HbA1c was 7.5. She is on Farxiga 10 mg daily. Lispro insulin max 30 U daily, and Toujeo 22 U daily.  The skin lesion on her left arm has been growing but before injury has not been tender,pruritic,or bleeding easily. She expresses interest in having the lesion removed and is concerned about serious process.  Review of Systems  Constitutional:  Positive for fatigue. Negative for chills and fever.  Respiratory:  Negative for cough, shortness of breath and wheezing.   Gastrointestinal:  Negative for abdominal pain, nausea and vomiting.  Skin:  Negative for rash and wound.  Neurological:  Negative for syncope and weakness.  See other pertinent positives and negatives in HPI.  Current Outpatient Medications on File Prior to Visit  Medication Sig Dispense Refill   acetaminophen (TYLENOL) 500 MG tablet Take 1,000 mg by mouth every 6 (six) hours as needed for mild pain.     Alcohol Swabs (B-D SINGLE USE SWABS REGULAR) PADS Use to test blood sugar up to 3 times daily 100 each 3   allopurinol (ZYLOPRIM) 100 MG tablet TAKE ONE TABLET BY MOUTH EVERY MORNING 90 tablet 1   aspirin EC 81 MG EC tablet Take 1 tablet (81 mg total) by mouth daily. Swallow whole. 30 tablet 11   atorvastatin (LIPITOR) 20 MG tablet TAKE ONE TABLET BY MOUTH EVERYDAY AT BEDTIME 90 tablet 2    benzonatate (TESSALON) 200 MG capsule Take 1 capsule (200 mg total) by mouth every 6 (six) hours as needed for cough. 40 capsule 0   Blood Glucose Monitoring Suppl (ACCU-CHEK GUIDE) w/Device KIT 1 Device by Does not apply route 3 (three) times daily. 1 kit 0   Cholecalciferol (VITAMIN D3) 50 MCG (2000 UT) capsule Take 2,000 Units by mouth daily.     Continuous Blood Gluc Receiver (FREESTYLE LIBRE 2 READER) DEVI Use as instructed to check blood sugar. 1 each 0   Continuous Blood Gluc Sensor (FREESTYLE LIBRE 2 SENSOR) MISC Change  sensors every 14 days 6 each 3   Continuous Blood Gluc Transmit (DEXCOM G6 TRANSMITTER) MISC 1 Device by Does not apply route as directed. 1 each 3   Eplontersen Sodium (WAINUA) 45 MG/0.8ML SOAJ Inject 45 mg into the skin every 30 (thirty) days. 0.8 mL 12   famotidine (PEPCID) 20 MG tablet Take 1 tablet (20 mg total) by mouth at bedtime. 90 tablet 1   FARXIGA 10 MG TABS tablet TAKE ONE TABLET BY MOUTH ONCE DAILY 90 tablet 3   Fluocinolone Acetonide 0.01 % OIL instill one drop in ears every 2-3 DAYS AS NEEDED FOR pruritis 20 mL 1   gabapentin (NEURONTIN) 600 MG tablet TAKE ONE TABLET BY MOUTH EVERY MORNING and TAKE ONE TABLET BY MOUTH EVERYDAY AT BEDTIME 60 tablet 3   glucose blood (ACCU-CHEK GUIDE) test strip Use to test blood  sugar up to 3 times daily 100 each 3   insulin glargine, 1 Unit Dial, (TOUJEO SOLOSTAR) 300 UNIT/ML Solostar Pen Inject 22 Units into the skin daily. Eat a snack with protein nightly before bedtime. 15 mL 6   insulin lispro (HUMALOG KWIKPEN) 100 UNIT/ML KwikPen Max daily 30 units 30 mL 3   Insulin Pen Needle 32G X 4 MM MISC 1 Device by Does not apply route in the morning, at noon, in the evening, and at bedtime. 400 each 3   ipratropium (ATROVENT) 0.06 % nasal spray Place 2 sprays into both nostrils 4 (four) times daily. (Patient taking differently: Place 2 sprays into both nostrils 4 (four) times daily. As needed) 15 mL 3   ketoconazole (NIZORAL) 2  % shampoo Apply 1 application. topically 2 (two) times a week. 120 mL 2   Lancets Misc. (ACCU-CHEK FASTCLIX LANCET) KIT Use to test blood sugar up to 3 times daily 1 kit 1   levothyroxine (SYNTHROID) 150 MCG tablet TAKE ONE TABLET BY MOUTH BEFORE BREAKFAST 90 tablet 3   magnesium oxide (MAG-OX) 400 MG tablet TAKE ONE TABLET BY MOUTH EVERY MORNING 90 tablet 1   metoprolol succinate (TOPROL-XL) 25 MG 24 hr tablet TAKE ONE TABLET BY MOUTH ONCE DAILY with OR immedaitely following A meal 90 tablet 3   Multiple Vitamin (MULTIVITAMIN) tablet Take 1 tablet by mouth daily. K FREE DAILY once daily (MVI with no vitamin K)     nitroGLYCERIN (NITROSTAT) 0.4 MG SL tablet Dissolve 1 tab under tongue as needed for chest pain. May repeat every 5 minutes x 2 doses. If no relief call 9-1-1. 25 tablet 2   pantoprazole (PROTONIX) 20 MG tablet TAKE ONE TABLET BY MOUTH ONCE DAILY 30 tablet 2   Polyethylene Glycol 3350 (MIRALAX PO) Take 1 Package by mouth daily as needed (constipation).     potassium chloride SA (KLOR-CON M) 20 MEQ tablet TAKE ONE TABLET BY MOUTH ONCE DAILY 90 tablet 1   Propylene Glycol (SYSTANE BALANCE OP) Place 1 drop into both eyes daily as needed (for dry eyes).     SALINE MIST SPRAY NA Place 2 sprays into the nose at bedtime.     Tafamidis 61 MG CAPS Take 1 capsule (61 mg total) by mouth daily. 30 capsule 12   torsemide (DEMADEX) 20 MG tablet Take 2 tablets (40 mg total) by mouth daily. 180 tablet 1   torsemide (DEMADEX) 20 MG tablet Take as directed 30 tablet 0   trolamine salicylate (ASPERCREME) 10 % cream Apply 1 application. topically as needed for muscle pain.     warfarin (COUMADIN) 5 MG tablet TAKE 1/2 TABLET BY MOUTH ONCE DAILY OR AS DIRECTED BY COUMADIN CLINIC 50 tablet 1   No current facility-administered medications on file prior to visit.    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 01/2015 Cardiolite: + inf wall ischemia, EF 56%;  b. 01/2015 Cath: LM nl, LAD 46m LCX min irregs, RCA dominant, 50-696m  Sleep apnea    Swallowing difficulty  Allergies  Allergen Reactions   Ace Inhibitors Other (See Comments)    unknown   Amlodipine Other (See Comments)    unknown   Atenolol Other (See Comments)    bradycardia   Avandia [Rosiglitazone] Other (See Comments)    unknown   Darvon [Propoxyphene] Other (See Comments)    unknown   Erythromycin Itching   Hydralazine Other (See Comments)    Burning in throat and chest   Hydrocodone Other (See Comments)    Hallucinations.   Levofloxacin Itching   Morphine And Related Other (See Comments)    Dizzy and hallucianation, vomiting; Willing to try low dose   Percocet [Oxycodone-Acetaminophen] Other (See Comments)    hallucination   Spironolactone Other (See Comments)    unknown   Tramadol Other (See Comments)    Unknown/does not recall reaction but does not want to take again    Social History   Socioeconomic History   Marital status: Widowed    Spouse name: Not on file   Number of children: Not on file   Years of education: Not on file   Highest education level: Not on file  Occupational History   Occupation: Retired  Tobacco Use   Smoking status: Never   Smokeless tobacco: Never  Vaping Use   Vaping Use: Never used  Substance and Sexual Activity   Alcohol use: No   Drug use: Never   Sexual activity: Not Currently  Other Topics Concern   Not on file  Social History Narrative   Lives with daughter   Social Determinants of Health   Financial Resource Strain: Low Risk  (11/30/2022)   Overall Financial Resource Strain  (CARDIA)    Difficulty of Paying Living Expenses: Not very hard  Food Insecurity: No Food Insecurity (11/30/2022)   Hunger Vital Sign    Worried About Running Out of Food in the Last Year: Never true    Ran Out of Food in the Last Year: Never true  Transportation Needs: No Transportation Needs (11/30/2022)   PRAPARE - Hydrologist (Medical): No    Lack of Transportation (Non-Medical): No  Physical Activity: Inactive (02/16/2022)   Exercise Vital Sign    Days of Exercise per Week: 0 days    Minutes of Exercise per Session: 0 min  Stress: No Stress Concern Present (02/16/2022)   [place]    Feeling of Stress : Not at all  Social Connections: Moderately Integrated (02/16/2022)   Social Connection and Isolation Panel [NHANES]    Frequency of Communication with Friends and Family: More than three times a week    Frequency of Social Gatherings with Friends and Family: More than three times a week    Attends Religious Services: More than 4 times per year    Active Member of [employer] or Organizations: Yes    Attends Archivist Meetings: More than 4 times per year    Marital Status: Widowed   Vitals:   12/20/22 1057  BP: 128/80  Pulse: 91  Resp: 16  SpO2: 96%   Body mass index is 40.42 kg/m.  Physical Exam Vitals and nursing note reviewed.  Constitutional:      General: She is not in acute distress.    Appearance: She is well-developed.  HENT:     Head: Normocephalic and atraumatic.  Eyes:     Conjunctiva/sclera: Conjunctivae normal.  Cardiovascular:     Rate and Rhythm: Normal rate and  regular rhythm.     Heart sounds: No murmur heard. Pulmonary:     Effort: Pulmonary effort is normal. No respiratory distress.     Breath sounds: Normal breath sounds.  Musculoskeletal:       Arms:  Lymphadenopathy:     Cervical: No cervical adenopathy.  Skin:    General: Skin is warm.      Findings: Lesion present. No erythema or rash.       Neurological:     Mental Status: She is alert and oriented to person, place, and time.  Psychiatric:        Mood and Affect: Mood and affect normal.     ASSESSMENT AND PLAN:  Skin lesion of left upper extremity ? Dermatofibroma, prurigos nodularis, and amyloidosis related among some. Lesion most likely benign. She would like it removed, so dermatology referral placed.  Type 2 diabetes mellitus with diabetic neuropathy, with long-term current use of insulin [facility]) Following with endocrinologist. Haig Prophet helped her to place new glucose sensor.  Soft tissue injury Left arm, mild Pain is improving. No further recommendations at this time.  Return if symptoms worsen or fail to improve, for keep next appointment.  Asani Deniston G. [name], MD  [facility]. [place] office.

## 2022-12-20 ENCOUNTER — Encounter: Payer: Self-pay | Admitting: Family Medicine

## 2022-12-20 ENCOUNTER — Ambulatory Visit (INDEPENDENT_AMBULATORY_CARE_PROVIDER_SITE_OTHER): Payer: Medicare Other | Admitting: Family Medicine

## 2022-12-20 VITALS — BP 128/80 | HR 91 | Resp 16 | Ht 62.0 in | Wt 221.0 lb

## 2022-12-20 DIAGNOSIS — L989 Disorder of the skin and subcutaneous tissue, unspecified: Secondary | ICD-10-CM | POA: Diagnosis not present

## 2022-12-20 DIAGNOSIS — E1140 Type 2 diabetes mellitus with diabetic neuropathy, unspecified: Secondary | ICD-10-CM

## 2022-12-20 DIAGNOSIS — Z794 Long term (current) use of insulin: Secondary | ICD-10-CM | POA: Diagnosis not present

## 2022-12-20 DIAGNOSIS — T1490XA Injury, unspecified, initial encounter: Secondary | ICD-10-CM

## 2022-12-20 NOTE — Patient Instructions (Addendum)
A few things to remember from today's visit:  Skin lesion of left upper extremity  Type 2 diabetes mellitus with diabetic neuropathy, with long-term current use of insulin (HCC)  No changes today. Keep appt with your endocrinologist. Appt with dermatologist will be arranged.  If you need refills for medications you take chronically, please call your pharmacy. Do not use My Chart to request refills or for acute issues that need immediate attention. If you send a my chart message, it may take a few days to be addressed, specially if I am not in the office.  Please be sure medication list is accurate. If a new problem present, please set up appointment sooner than planned today.

## 2022-12-22 DIAGNOSIS — G4733 Obstructive sleep apnea (adult) (pediatric): Secondary | ICD-10-CM | POA: Diagnosis not present

## 2022-12-23 ENCOUNTER — Telehealth: Payer: Self-pay | Admitting: Pulmonary Disease

## 2022-12-23 NOTE — Telephone Encounter (Signed)
Patient with patient she would like to get a new cpap machine she advises its over 80 years old. She also states it is burning her nose and its constantly waking her up at night and she's no longer getting good sleep. Dr. Jenetta Downer can you please advise. With your okay I will place a new order for a cpap machine

## 2022-12-23 NOTE — Telephone Encounter (Signed)
PT saw Dr. Jenetta Downer last May. Told him at that time the CPAP machine burns her nose. States it is more than 80 YO. Can we send in a script for a new Cpap?  Pls call PT to advise  [phone]

## 2022-12-26 NOTE — Telephone Encounter (Signed)
Needs office visit with myself or APP  CPAP by itself will not cause burning of the nose -This may be a mask issue  Needs follow-up and CPAP compliance to be reviewed before order is placed

## 2022-12-26 NOTE — Telephone Encounter (Signed)
Called and spoke with patient. Patient verbalized understanding. Patient has been scheduled with Dr. Ander Slade for follow up.   Nothing further needed.

## 2022-12-27 ENCOUNTER — Other Ambulatory Visit (HOSPITAL_COMMUNITY): Payer: Self-pay

## 2022-12-28 ENCOUNTER — Other Ambulatory Visit (HOSPITAL_COMMUNITY): Payer: Self-pay

## 2022-12-30 ENCOUNTER — Other Ambulatory Visit: Payer: Self-pay

## 2023-01-02 ENCOUNTER — Other Ambulatory Visit: Payer: Self-pay

## 2023-01-04 NOTE — Progress Notes (Signed)
Cardiology Office Note:   Date:  01/06/2023  NAME:  Regina [name]    MRN: [id number] DOB:  1941/09/15   PCP:  [name], [name], MD  Cardiologist:  Evalina Field, MD  Electrophysiologist:  None   Referring MD: [name], [name], MD   Chief Complaint  Patient presents with   Follow-up    History of Present Illness:   Regina [name] is a 83 y.o. female with below history who presents for follow-up.  Weight is up to 226 pounds.  She reports she feels bloated.  She tells me her weight will not go down from around 225 pounds.  No lower extremity edema.  She is short of breath when she bends over.  She describes bloating.  She reports she is not constipated.  She has no evidence of gross volume overload.  We discussed repeating her heart testing.  Blood pressure stable.  Pulse in the 90s.  Denies any chest pain.  Just has abdominal discomfort.  Diabetes seems to be controlled no symptoms concerning for angina.  She has done quite well from a heart failure standpoint.  Problem List 1.  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 Persistent Afib -LAA slugde vs thrombus 05/05/2020  -persistent sludge 07/01/2020 -LAA thrombus persistent 08/05/2020 -on warfarin 3. DM -A1c 7.3 4. HLD -T chol 115, HDL 54, LDL 40, triglycerides 101 5. HTN 6. Non-obstructive CAD -LHC 01/19/2015 [facility] [place] -50% mid RCA and 50% LAD -MPI 08/14/2020 -> normal 7. OSA 8. CKD 3/4 9. CVA -11/2020 @ [facility]  10. 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 01/2015 Cardiolite: + inf wall ischemia, EF 56%;  b. 01/2015 Cath: LM nl, LAD 80m, LCX min irregs, RCA dominant, 50-19m.   Sleep apnea    Swallowing difficulty     Past Surgical History: Past Surgical History:  Procedure Laterality Date   ABDOMINAL HYSTERECTOMY     BUBBLE STUDY  07/01/2020   Procedure: BUBBLE STUDY;  Surgeon: Elouise Munroe, MD;  Location: [place];  Service: Cardiovascular;;   BUBBLE STUDY  08/05/2020   Procedure: BUBBLE STUDY;  Surgeon: Geralynn Rile, MD;  Location: [place];  Service: Cardiovascular;;  done with definity    CARDIAC CATHETERIZATION     ESOPHAGOGASTRODUODENOSCOPY     a. 01/2015 EGD: patent esophagus.   ESOPHAGOGASTRODUODENOSCOPY N/A 01/20/2015   Procedure: ESOPHAGOGASTRODUODENOSCOPY (EGD);  Surgeon: Carol Ada, MD;  Location: [facility] ENDOSCOPY;  Service: Endoscopy;  Laterality: N/A;   HAND SURGERY     JOINT REPLACEMENT     reports history bilateral TKA and right TSA   LEFT HEART CATHETERIZATION WITH CORONARY ANGIOGRAM N/A 01/19/2015   RIGHT HEART CATH N/A 12/27/2021   Procedure: RIGHT HEART CATH;  Surgeon: Lorretta Harp, MD;  Location: [place] CV LAB;  Service: Cardiovascular;  Laterality: N/A;  TEE WITHOUT CARDIOVERSION  05/04/2020   TEE WITHOUT CARDIOVERSION N/A 05/05/2020   Procedure: TRANSESOPHAGEAL ECHOCARDIOGRAM (TEE);  Surgeon: Pixie Casino, MD;  Location: [facility] ENDOSCOPY;  Service: Cardiovascular;  Laterality: N/A;   TEE WITHOUT CARDIOVERSION N/A 07/01/2020   Procedure: TRANSESOPHAGEAL ECHOCARDIOGRAM (TEE);  Surgeon: Elouise Munroe, MD;  Location: [place];  Service: Cardiovascular;  Laterality: N/A;   TEE WITHOUT CARDIOVERSION N/A 08/05/2020   Procedure: TRANSESOPHAGEAL ECHOCARDIOGRAM (TEE);  Surgeon: Geralynn Rile, MD;  Location: [place];  Service: Cardiovascular;  Laterality: N/A;    THYROIDECTOMY      Current Medications: Current Meds  Medication Sig   acetaminophen (TYLENOL) 500 MG tablet Take 1,000 mg by mouth every 6 (six) hours as needed for mild pain.   Alcohol Swabs (B-D SINGLE USE SWABS REGULAR) PADS Use to test blood sugar up to 3 times daily   allopurinol (ZYLOPRIM) 100 MG tablet TAKE ONE TABLET BY MOUTH EVERY MORNING   aspirin EC 81 MG EC tablet Take 1 tablet (81 mg total) by mouth daily. Swallow whole.   atorvastatin (LIPITOR) 20 MG tablet TAKE ONE TABLET BY MOUTH EVERYDAY AT BEDTIME   benzonatate (TESSALON) 200 MG capsule Take 1 capsule (200 mg total) by mouth every 6 (six) hours as needed for cough.   Blood Glucose Monitoring Suppl (ACCU-CHEK GUIDE) w/Device KIT 1 Device by Does not apply route 3 (three) times daily.   Cholecalciferol (VITAMIN D3) 50 MCG (2000 UT) capsule Take 2,000 Units by mouth daily.   Continuous Blood Gluc Receiver (FREESTYLE LIBRE 2 READER) DEVI Use as instructed to check blood sugar.   Continuous Blood Gluc Sensor (FREESTYLE LIBRE 2 SENSOR) MISC Change  sensors every 14 days   Continuous Blood Gluc Transmit (DEXCOM G6 TRANSMITTER) MISC 1 Device by Does not apply route as directed.   Eplontersen Sodium (WAINUA) 45 MG/0.8ML SOAJ Inject 45 mg into the skin every 30 (thirty) days.   famotidine (PEPCID) 20 MG tablet Take 1 tablet (20 mg total) by mouth at bedtime.   FARXIGA 10 MG TABS tablet TAKE ONE TABLET BY MOUTH ONCE DAILY   Fluocinolone Acetonide 0.01 % OIL instill one drop in ears every 2-3 DAYS AS NEEDED FOR pruritis   gabapentin (NEURONTIN) 600 MG tablet TAKE ONE TABLET BY MOUTH EVERY MORNING and TAKE ONE TABLET BY MOUTH EVERYDAY AT BEDTIME   glucose blood (ACCU-CHEK GUIDE) test strip Use to test blood sugar up to 3 times daily   insulin glargine, 1 Unit Dial, (TOUJEO SOLOSTAR) 300 UNIT/ML Solostar Pen Inject 22 Units into the skin daily. Eat a snack with protein nightly before bedtime.   insulin lispro (HUMALOG KWIKPEN) 100  UNIT/ML KwikPen Max daily 30 units   Insulin Pen Needle 32G X 4 MM MISC 1 Device by Does not apply route in the morning, at noon, in the evening, and at bedtime.   ipratropium (ATROVENT) 0.06 % nasal spray Place 2 sprays into both nostrils 4 (four) times daily. (Patient taking differently: Place 2 sprays into both nostrils 4 (four) times daily. As needed)   ketoconazole (NIZORAL) 2 % shampoo Apply 1 application. topically 2 (two) times a week.   Lancets Misc. (ACCU-CHEK FASTCLIX LANCET) KIT Use to test blood sugar up to 3 times daily   levothyroxine (SYNTHROID) 150 MCG tablet TAKE ONE TABLET BY MOUTH BEFORE BREAKFAST   magnesium oxide (MAG-OX) 400 MG tablet TAKE ONE TABLET BY MOUTH EVERY MORNING   metoprolol succinate (TOPROL-XL) 25 MG 24 hr tablet TAKE ONE TABLET BY  MOUTH ONCE DAILY with OR immedaitely following A meal   Multiple Vitamin (MULTIVITAMIN) tablet Take 1 tablet by mouth daily. K FREE DAILY once daily (MVI with no vitamin K)   nitroGLYCERIN (NITROSTAT) 0.4 MG SL tablet Dissolve 1 tab under tongue as needed for chest pain. May repeat every 5 minutes x 2 doses. If no relief call 9-1-1.   pantoprazole (PROTONIX) 20 MG tablet TAKE ONE TABLET BY MOUTH ONCE DAILY   Polyethylene Glycol 3350 (MIRALAX PO) Take 1 Package by mouth daily as needed (constipation).   potassium chloride SA (KLOR-CON M) 20 MEQ tablet TAKE ONE TABLET BY MOUTH ONCE DAILY   Propylene Glycol (SYSTANE BALANCE OP) Place 1 drop into both eyes daily as needed (for dry eyes).   SALINE MIST SPRAY NA Place 2 sprays into the nose at bedtime.   Tafamidis 61 MG CAPS Take 1 capsule (61 mg total) by mouth daily.   torsemide (DEMADEX) 20 MG tablet Take 2 tablets (40 mg total) by mouth daily.   torsemide (DEMADEX) 20 MG tablet Take as directed   trolamine salicylate (ASPERCREME) 10 % cream Apply 1 application. topically as needed for muscle pain.   warfarin (COUMADIN) 5 MG tablet TAKE 1/2 TABLET BY MOUTH ONCE DAILY OR AS DIRECTED BY  COUMADIN CLINIC     Allergies:    Ace inhibitors, Amlodipine, Atenolol, Avandia [rosiglitazone], Darvon [propoxyphene], Erythromycin, Hydralazine, Hydrocodone, Levofloxacin, Morphine and related, Percocet [oxycodone-acetaminophen], Spironolactone, and Tramadol   Social History: Social History   Socioeconomic History   Marital status: Widowed    Spouse name: Not on file   Number of children: Not on file   Years of education: Not on file   Highest education level: Not on file  Occupational History   Occupation: Retired  Tobacco Use   Smoking status: Never   Smokeless tobacco: Never  Vaping Use   Vaping Use: Never used  Substance and Sexual Activity   Alcohol use: No   Drug use: Never   Sexual activity: Not Currently  Other Topics Concern   Not on file  Social History Narrative   Lives with daughter   Social Determinants of Health   Financial Resource Strain: Low Risk  (11/30/2022)   Overall Financial Resource Strain (CARDIA)    Difficulty of Paying Living Expenses: Not very hard  Food Insecurity: No Food Insecurity (11/30/2022)   Hunger Vital Sign    Worried About Running Out of Food in the Last Year: Never true    Ran Out of Food in the Last Year: Never true  Transportation Needs: No Transportation Needs (11/30/2022)   PRAPARE - Hydrologist (Medical): No    Lack of Transportation (Non-Medical): No  Physical Activity: Inactive (02/16/2022)   Exercise Vital Sign    Days of Exercise per Week: 0 days    Minutes of Exercise per Session: 0 min  Stress: No Stress Concern Present (02/16/2022)   [place]    Feeling of Stress : Not at all  Social Connections: Moderately Integrated (02/16/2022)   Social Connection and Isolation Panel [NHANES]    Frequency of Communication with Friends and Family: More than three times a week    Frequency of Social Gatherings with Friends and Family: More  than three times a week    Attends Religious Services: More than 4 times per year    Active Member of [employer] or Organizations: Yes    Attends Archivist  Meetings: More than 4 times per year    Marital Status: Widowed     Family History: The patient's family history includes Alcoholism in her father; Cancer in her father; Heart disease in her mother; Hypertension in her mother.  ROS:   All other ROS reviewed and negative. Pertinent positives noted in the HPI.     EKGs/Labs/Other Studies Reviewed:   The following studies were personally reviewed by me today:  Recent Labs: 01/12/2022: B Natriuretic Peptide 420.4 05/17/2022: BUN 46; Creatinine 2.2; Potassium 4.1; Sodium 138 08/31/2022: Hemoglobin 12.2; Platelets 282.0   Recent Lipid Panel    Component Value Date/Time   CHOL 115 08/13/2021 1501   CHOL 117 03/24/2020 1428   TRIG 101.0 08/13/2021 1501   HDL 54.80 08/13/2021 1501   HDL 56 03/24/2020 1428   CHOLHDL 2 08/13/2021 1501   VLDL 20.2 08/13/2021 1501   LDLCALC 40 08/13/2021 1501   LDLCALC 49 03/24/2020 1428    Physical Exam:   VS:  BP (!) 108/56 (BP Location: Left Arm, Patient Position: Sitting, Cuff Size: Large)   Pulse 90   Ht 5\' 2"  (1.575 m)   Wt 226 lb (102.5 kg)   BMI 41.34 kg/m    Wt Readings from Last 3 Encounters:  01/06/23 226 lb (102.5 kg)  12/20/22 221 lb (100.2 kg)  12/16/22 221 lb 3.2 oz (100.3 kg)    General: Well nourished, well developed, in no acute distress Head: Atraumatic, normal size  Eyes: PEERLA, EOMI  Neck: Supple, no JVD Endocrine: No thryomegaly Cardiac: Normal S1, S2; RRR; no murmurs, rubs, or gallops Lungs: Clear to auscultation bilaterally, no wheezing, rhonchi or rales  Abd: Distended abdomen Ext: No edema, pulses 2+ Musculoskeletal: No deformities, BUE and BLE strength normal and equal Skin: Warm and dry, no rashes   Neuro: Alert and oriented to person, place, time, and situation, CNII-XII grossly intact, no focal  deficits  Psych: Normal mood and affect   ASSESSMENT:   Regina [name] is a 82 y.o. female who presents for the following: 1. Hereditary cardiac amyloidosis ([place])   2. Chronic systolic congestive heart failure ([place])   3. Coronary artery disease involving native coronary artery of native heart without angina pectoris   4. Persistent atrial fibrillation ([place])   5. Acquired thrombophilia ([place])   6. Imbalance     PLAN:   1. Hereditary cardiac amyloidosis ([place]) 2. Chronic systolic congestive heart failure (HCC) -Longstanding history of hereditary cardiac amyloidosis.  Ejection fraction was reduced.  Now up to 50%.  She reports worsening abdominal symptoms.  No signs of gross volume overload.  Symptoms could just be related to gut edema in the setting of cardiac amyloidosis.  We will increase her torsemide to 40 mg in the morning and 20 mg at night. -Symptoms could just be related to cardiac amyloidosis.  She has hereditary variant disease.  She has neuropathy and imbalance.  She is currently on Eplontersen.  She is started on 2 doses.  This may take a few days to work.  We will see how she does. -She really does not appear to have volume overloaded.  We will recheck her kidney function as well as a BNP.  I would also like to recheck her echo.  This will give [place] a better idea of filling pressures. -EF up to 50%.  Currently on metoprolol succinate 25 mg daily.  Blood pressure has precluded other agents.  She is also not on ACE/ARB/ARNI/MRA given CKD stage IV.  We will closely monitor her kidney function.  3. Coronary artery disease involving native coronary artery of native heart without angina pectoris -Nonobstructive CAD in the past.  Currently on aspirin and Lipitor.  LDL is at goal in the past.  4. Persistent atrial fibrillation ([place]) 5. Acquired thrombophilia (HCC) -Permanent A-fib.  Has had recurrent left atrial appendage thrombus.  We have decided on rate control strategy.  She is on  Coumadin.  Has been stable.  6. Imbalance -Currently with significant imbalance and inactivity.  I believe physical therapy would help her.  We will reorder this for home health.     Disposition: Return in about 3 months (around 04/08/2023).  Medication Adjustments/Labs and Tests Ordered: Current medicines are reviewed at length with the patient today.  Concerns regarding medicines are outlined above.  Orders Placed This Encounter  Procedures   Basic metabolic panel   Brain natriuretic peptide   Ambulatory referral to Physical Therapy   ECHOCARDIOGRAM COMPLETE   No orders of the defined types were placed in this encounter.   Patient Instructions  Medication Instructions:  START taking Torsemide 20 mg in the afternoon, along with the 40 daily- only for 3 days, then decrease back down to 40 mg daily.   *If you need a refill on your cardiac medications before your next appointment, please call your pharmacy*   Lab Work: BMET, BNP today   If you have labs (blood work) drawn today and your tests are completely normal, you will receive your results only by: [place] (if you have MyChart) OR A paper copy in the mail If you have any lab test that is abnormal or we need to change your treatment, we will call you to review the results.   Testing/Procedures: Echocardiogram - Your physician has requested that you have an echocardiogram. Echocardiography is a painless test that uses sound waves to create images of your heart. It provides your doctor with information about the size and shape of your heart and how well your heart's chambers and valves are working. This procedure takes approximately one hour. There are no restrictions for this procedure.     Follow-Up: At [facility], you and your health needs are our priority.  As part of our continuing mission to provide you with exceptional heart care, we have created designated Provider Care Teams.  These Care Teams  include your primary Cardiologist (physician) and Advanced Practice Providers (APPs -  Physician Assistants and Nurse Practitioners) who all work together to provide you with the care you need, when you need it.  We recommend signing up for the patient portal called "MyChart".  Sign up information is provided on this After Visit Summary.  MyChart is used to connect with patients for Virtual Visits (Telemedicine).  Patients are able to view lab/test results, encounter notes, upcoming appointments, etc.  Non-urgent messages can be sent to your provider as well.   To learn more about what you can do with MyChart, go to [email or web address].    Your next appointment:   3 month(s)  Provider:   Evalina Field, MD     Other Instructions Referral to PT-     Time Spent with Patient: I have spent a total of 35 minutes with patient reviewing hospital notes, telemetry, EKGs, labs and examining the patient as well as establishing an assessment and plan that was discussed with the patient.  > 50% of time was spent in direct patient care.  Signed, Addison Naegeli. Audie Box, MD,  [place]  [address] [place], [place] [address] [phone]  01/06/2023 3:58 PM

## 2023-01-05 ENCOUNTER — Telehealth: Payer: Self-pay

## 2023-01-05 NOTE — Progress Notes (Signed)
Care Management & Coordination Services Pharmacy Team  Reason for Encounter: Medication coordination and delivery  Contacted patient to discuss medications and coordinate delivery from [facility]. Unsuccessful outreach. Left voicemail for patient to return call. Multiple attempts.   Cycle dispensing form sent to Burman Riis for review.   Last adherence delivery date:12/20/2022      Patient is due for next adherence delivery on: 01/18/2023  This delivery to include: Adherence Packaging  30 Days  Warfarin 5 mg tablet (easy top vials) use as directed Allopurinol 100 mg 1 tablet at breakfast Klor-Con 20 meq - 1 tablet at breakfast Protonix 20 mg - 1 tablet at breakfast Aspirin 81 mg - 1 tablet at breakfast Magnesium oxide 400 mg 1 tablet at breakfast Cetirizine 10 mg 1 tablet at breakfast (OTC med) Atorvastatin 20 mg 1 tablet at bedtime Farxiga 10 mg 1 tablet at breakfast Metoprolol succinate 25 mg 1 tablet at breakfast Gabapentin 600 mg tablet  1 tablet at breakfast and 1 tablet at bedtime Levothyroxine 150 mcg 1 tablet before breakfast Torsemide 20 mg take 2 tablets at breakfast Pen needles 32 g X 4 mm  Humalog kwikpen 100 un - inject 30 units max dose daily  Patient declined the following medications this month: Dexcom G6 sensors last filled 10/03/22 90 DS  Toujeo solostar 300 un - inject 22 units daily (next fill date 01/16/23) Nitrostat 0.4mg  - 1 tablet every 5 min as needed   Fluocinolone Acetonide 0.01 % OIL (ear drop) Ketoconazole (NIZORAL) 2 % shampoo  Delivery scheduled for 01/18/2023. Unable to speak with patient to confirm date.   Any concerns about your medications?   How often do you forget or accidentally miss a dose?   Do you use a pillbox?   Is patient in packaging   If yes  What is the date on your next pill pack?  Any concerns or issues with your packaging?  Recent blood pressure readings are as follows:  Recent blood glucose readings are  as follows:   Chart review: Recent office visits:  12/20/2022 [name] MD - Patient was seen for skin lesion of left upper extremity and additional concerns. No medication changes.   Recent consult visits:  12/16/2022 Lauree Chandler MD (cardiology) - Patient was seen for acute on chronic systolic heart failure and additional concerns.Double torsemide as discuss for 3 days.  Hospital visits:  None  Medications: Outpatient Encounter Medications as of 01/05/2023  Medication Sig   acetaminophen (TYLENOL) 500 MG tablet Take 1,000 mg by mouth every 6 (six) hours as needed for mild pain.   Alcohol Swabs (B-D SINGLE USE SWABS REGULAR) PADS Use to test blood sugar up to 3 times daily   allopurinol (ZYLOPRIM) 100 MG tablet TAKE ONE TABLET BY MOUTH EVERY MORNING   aspirin EC 81 MG EC tablet Take 1 tablet (81 mg total) by mouth daily. Swallow whole.   atorvastatin (LIPITOR) 20 MG tablet TAKE ONE TABLET BY MOUTH EVERYDAY AT BEDTIME   benzonatate (TESSALON) 200 MG capsule Take 1 capsule (200 mg total) by mouth every 6 (six) hours as needed for cough.   Blood Glucose Monitoring Suppl (ACCU-CHEK GUIDE) w/Device KIT 1 Device by Does not apply route 3 (three) times daily.   Cholecalciferol (VITAMIN D3) 50 MCG (2000 UT) capsule Take 2,000 Units by mouth daily.   Continuous Blood Gluc Receiver (FREESTYLE LIBRE 2 READER) DEVI Use as instructed to check blood sugar.   Continuous Blood Gluc Sensor (FREESTYLE LIBRE 2 SENSOR) MISC  Change  sensors every 14 days   Continuous Blood Gluc Transmit (DEXCOM G6 TRANSMITTER) MISC 1 Device by Does not apply route as directed.   Eplontersen Sodium (WAINUA) 45 MG/0.8ML SOAJ Inject 45 mg into the skin every 30 (thirty) days.   famotidine (PEPCID) 20 MG tablet Take 1 tablet (20 mg total) by mouth at bedtime.   FARXIGA 10 MG TABS tablet TAKE ONE TABLET BY MOUTH ONCE DAILY   Fluocinolone Acetonide 0.01 % OIL instill one drop in ears every 2-3 DAYS AS NEEDED FOR  pruritis   gabapentin (NEURONTIN) 600 MG tablet TAKE ONE TABLET BY MOUTH EVERY MORNING and TAKE ONE TABLET BY MOUTH EVERYDAY AT BEDTIME   glucose blood (ACCU-CHEK GUIDE) test strip Use to test blood sugar up to 3 times daily   insulin glargine, 1 Unit Dial, (TOUJEO SOLOSTAR) 300 UNIT/ML Solostar Pen Inject 22 Units into the skin daily. Eat a snack with protein nightly before bedtime.   insulin lispro (HUMALOG KWIKPEN) 100 UNIT/ML KwikPen Max daily 30 units   Insulin Pen Needle 32G X 4 MM MISC 1 Device by Does not apply route in the morning, at noon, in the evening, and at bedtime.   ipratropium (ATROVENT) 0.06 % nasal spray Place 2 sprays into both nostrils 4 (four) times daily. (Patient taking differently: Place 2 sprays into both nostrils 4 (four) times daily. As needed)   ketoconazole (NIZORAL) 2 % shampoo Apply 1 application. topically 2 (two) times a week.   Lancets Misc. (ACCU-CHEK FASTCLIX LANCET) KIT Use to test blood sugar up to 3 times daily   levothyroxine (SYNTHROID) 150 MCG tablet TAKE ONE TABLET BY MOUTH BEFORE BREAKFAST   magnesium oxide (MAG-OX) 400 MG tablet TAKE ONE TABLET BY MOUTH EVERY MORNING   metoprolol succinate (TOPROL-XL) 25 MG 24 hr tablet TAKE ONE TABLET BY MOUTH ONCE DAILY with OR immedaitely following A meal   Multiple Vitamin (MULTIVITAMIN) tablet Take 1 tablet by mouth daily. K FREE DAILY once daily (MVI with no vitamin K)   nitroGLYCERIN (NITROSTAT) 0.4 MG SL tablet Dissolve 1 tab under tongue as needed for chest pain. May repeat every 5 minutes x 2 doses. If no relief call 9-1-1.   pantoprazole (PROTONIX) 20 MG tablet TAKE ONE TABLET BY MOUTH ONCE DAILY   Polyethylene Glycol 3350 (MIRALAX PO) Take 1 Package by mouth daily as needed (constipation).   potassium chloride SA (KLOR-CON M) 20 MEQ tablet TAKE ONE TABLET BY MOUTH ONCE DAILY   Propylene Glycol (SYSTANE BALANCE OP) Place 1 drop into both eyes daily as needed (for dry eyes).   SALINE MIST SPRAY NA Place 2  sprays into the nose at bedtime.   Tafamidis 61 MG CAPS Take 1 capsule (61 mg total) by mouth daily.   torsemide (DEMADEX) 20 MG tablet Take 2 tablets (40 mg total) by mouth daily.   torsemide (DEMADEX) 20 MG tablet Take as directed   trolamine salicylate (ASPERCREME) 10 % cream Apply 1 application. topically as needed for muscle pain.   warfarin (COUMADIN) 5 MG tablet TAKE 1/2 TABLET BY MOUTH ONCE DAILY OR AS DIRECTED BY COUMADIN CLINIC   No facility-administered encounter medications on file as of 01/05/2023.   BP Readings from Last 3 Encounters:  12/20/22 128/80  12/16/22 (!) 148/80  10/14/22 124/70    Pulse Readings from Last 3 Encounters:  12/20/22 91  12/16/22 86  10/14/22 91    Lab Results  Component Value Date/Time   HGBA1C 7.5 (A) 08/01/2022 01:34 PM  HGBA1C 7.3 (A) 01/31/2022 01:56 PM   HGBA1C 7.9 (H) 08/11/2021 08:15 AM   HGBA1C 7.2 (H) 06/18/2021 11:10 AM   Lab Results  Component Value Date   CREATININE 2.2 (A) 05/17/2022   BUN 46 (A) 05/17/2022   GFR 24.47 (L) 08/13/2021   GFRNONAA 23 (L) 01/12/2022   GFRAA 26 10/20/2021   NA 138 05/17/2022   K 4.1 05/17/2022   CALCIUM 9.2 05/17/2022   CO2 31 (A) 05/17/2022   [facility] Pharmacist Assistant [phone]

## 2023-01-06 ENCOUNTER — Encounter: Payer: Self-pay | Admitting: Cardiovascular Disease

## 2023-01-06 ENCOUNTER — Ambulatory Visit: Payer: Medicare Other | Attending: Cardiovascular Disease | Admitting: Cardiovascular Disease

## 2023-01-06 ENCOUNTER — Ambulatory Visit (INDEPENDENT_AMBULATORY_CARE_PROVIDER_SITE_OTHER): Payer: Medicare Other | Admitting: *Deleted

## 2023-01-06 VITALS — BP 108/56 | HR 90 | Ht 62.0 in | Wt 226.0 lb

## 2023-01-06 DIAGNOSIS — I4819 Other persistent atrial fibrillation: Secondary | ICD-10-CM

## 2023-01-06 DIAGNOSIS — Z7901 Long term (current) use of anticoagulants: Secondary | ICD-10-CM | POA: Diagnosis not present

## 2023-01-06 DIAGNOSIS — E854 Organ-limited amyloidosis: Secondary | ICD-10-CM

## 2023-01-06 DIAGNOSIS — I2510 Atherosclerotic heart disease of native coronary artery without angina pectoris: Secondary | ICD-10-CM | POA: Diagnosis not present

## 2023-01-06 DIAGNOSIS — D6869 Other thrombophilia: Secondary | ICD-10-CM

## 2023-01-06 DIAGNOSIS — I43 Cardiomyopathy in diseases classified elsewhere: Secondary | ICD-10-CM

## 2023-01-06 DIAGNOSIS — I5022 Chronic systolic (congestive) heart failure: Secondary | ICD-10-CM

## 2023-01-06 DIAGNOSIS — R2689 Other abnormalities of gait and mobility: Secondary | ICD-10-CM

## 2023-01-06 LAB — POCT INR: POC INR: 2.4

## 2023-01-06 NOTE — Patient Instructions (Signed)
Description   Continue taking warfarin 1/2 tablet daily. Repeat INR in 4 weeks; Anticoagulation Clinic [phone]        

## 2023-01-06 NOTE — Patient Instructions (Signed)
Medication Instructions:  START taking Torsemide 20 mg in the afternoon, along with the 40 daily- only for 3 days, then decrease back down to 40 mg daily.   *If you need a refill on your cardiac medications before your next appointment, please call your pharmacy*   Lab Work: BMET, BNP today   If you have labs (blood work) drawn today and your tests are completely normal, you will receive your results only by: [place] (if you have MyChart) OR A paper copy in the mail If you have any lab test that is abnormal or we need to change your treatment, we will call you to review the results.   Testing/Procedures: Echocardiogram - Your physician has requested that you have an echocardiogram. Echocardiography is a painless test that uses sound waves to create images of your heart. It provides your doctor with information about the size and shape of your heart and how well your heart's chambers and valves are working. This procedure takes approximately one hour. There are no restrictions for this procedure.     Follow-Up: At [facility], you and your health needs are our priority.  As part of our continuing mission to provide you with exceptional heart care, we have created designated Provider Care Teams.  These Care Teams include your primary Cardiologist (physician) and Advanced Practice Providers (APPs -  Physician Assistants and Nurse Practitioners) who all work together to provide you with the care you need, when you need it.  We recommend signing up for the patient portal called "MyChart".  Sign up information is provided on this After Visit Summary.  MyChart is used to connect with patients for Virtual Visits (Telemedicine).  Patients are able to view lab/test results, encounter notes, upcoming appointments, etc.  Non-urgent messages can be sent to your provider as well.   To learn more about what you can do with MyChart, go to [email or web address].    Your next appointment:    3 month(s)  Provider:   Evalina Field, MD     Other Instructions Referral to PT-

## 2023-01-07 LAB — BASIC METABOLIC PANEL
BUN/Creatinine Ratio: 22 (ref 12–28)
BUN: 41 mg/dL — ABNORMAL HIGH (ref 8–27)
CO2: 26 mmol/L (ref 20–29)
Calcium: 10 mg/dL (ref 8.7–10.3)
Chloride: 98 mmol/L (ref 96–106)
Creatinine, Ser: 1.86 mg/dL — ABNORMAL HIGH (ref 0.57–1.00)
Glucose: 167 mg/dL — ABNORMAL HIGH (ref 70–99)
Potassium: 4.4 mmol/L (ref 3.5–5.2)
Sodium: 141 mmol/L (ref 134–144)
eGFR: 27 mL/min/{1.73_m2} — ABNORMAL LOW (ref >59)

## 2023-01-07 LAB — BRAIN NATRIURETIC PEPTIDE: BNP: 491 pg/mL — ABNORMAL HIGH (ref 0.0–100.0)

## 2023-01-08 ENCOUNTER — Other Ambulatory Visit: Payer: Self-pay | Admitting: Cardiovascular Disease

## 2023-01-08 DIAGNOSIS — I4891 Unspecified atrial fibrillation: Secondary | ICD-10-CM

## 2023-01-09 NOTE — Therapy (Signed)
OUTPATIENT PHYSICAL THERAPY NEURO EVALUATION   Patient Name: Regina [name] MRN: [id number] DOB:1941/02/28, 82 y.o., female Today's Date: 01/10/2023   PCP: [name], [name], MD  REFERRING PROVIDER: Geralynn Rile, MD  END OF SESSION:  PT End of Session - 01/10/23 1617     Visit Number 1    Number of Visits 13    Date for PT Re-Evaluation 02/21/23    Authorization Type U[employer] Medicare    PT Start Time 1458   pt late   PT Stop Time 1531    PT Time Calculation (min) 33 min    Equipment Utilized During Treatment Gait belt    Activity Tolerance Patient tolerated treatment well    Behavior During Therapy WFL for tasks assessed/performed             Past Medical History:  Diagnosis Date   Back pain    CHF (congestive heart failure) (HCC)    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 01/2015 Cardiolite: + inf wall ischemia, EF 56%;  b. 01/2015 Cath: LM nl, LAD 46m, LCX min irregs, RCA dominant, 50-45m.   Sleep apnea    Swallowing difficulty    Past Surgical History:  Procedure Laterality Date   ABDOMINAL HYSTERECTOMY     BUBBLE STUDY  07/01/2020   Procedure: BUBBLE STUDY;  Surgeon: Elouise Munroe, MD;  Location: [facility];  Service: Cardiovascular;;   BUBBLE STUDY  08/05/2020   Procedure: BUBBLE STUDY;  Surgeon: Geralynn Rile, MD;  Location: [facility];  Service: Cardiovascular;;  done with definity    CARDIAC CATHETERIZATION     ESOPHAGOGASTRODUODENOSCOPY     a. 01/2015 EGD: patent esophagus.   ESOPHAGOGASTRODUODENOSCOPY N/A 01/20/2015   Procedure: ESOPHAGOGASTRODUODENOSCOPY (EGD);  Surgeon: Carol Ada, MD;  Location: [facility] ENDOSCOPY;  Service: Endoscopy;  Laterality:  N/A;   HAND SURGERY     JOINT REPLACEMENT     reports history bilateral TKA and right TSA   LEFT HEART CATHETERIZATION WITH CORONARY ANGIOGRAM N/A 01/19/2015   RIGHT HEART CATH N/A 12/27/2021   Procedure: RIGHT HEART CATH;  Surgeon: Lorretta Harp, MD;  Location: [place] CV LAB;  Service: Cardiovascular;  Laterality: N/A;   TEE WITHOUT CARDIOVERSION  05/04/2020   TEE WITHOUT CARDIOVERSION N/A 05/05/2020   Procedure: TRANSESOPHAGEAL ECHOCARDIOGRAM (TEE);  Surgeon: Pixie Casino, MD;  Location: [facility] ENDOSCOPY;  Service: Cardiovascular;  Laterality: N/A;   TEE WITHOUT CARDIOVERSION N/A 07/01/2020   Procedure: TRANSESOPHAGEAL ECHOCARDIOGRAM (TEE);  Surgeon: Elouise Munroe, MD;  Location: [place];  Service: Cardiovascular;  Laterality: N/A;   TEE WITHOUT CARDIOVERSION N/A 08/05/2020   Procedure: TRANSESOPHAGEAL ECHOCARDIOGRAM (TEE);  Surgeon: Geralynn Rile, MD;  Location: [place];  Service: Cardiovascular;  Laterality: N/A;   THYROIDECTOMY     Patient Active Problem List   Diagnosis Date Noted   Multiple lipomas 10/04/2022   Dizziness, nonspecific 09/22/2022   Dysphagia 09/22/2022   Left shoulder pain 09/22/2022   Pain due to onychomycosis of toenail of left foot 02/11/2022   Chest pain 12/26/2021   Left atrial thrombus 12/25/2021   Wild-type transthyretin-related (ATTR) amyloidosis (HCC)    Lung nodules  12/13/2021   Polymyalgia rheumatica ([place]) 06/30/2021   Neck pain 06/18/2021   Gait abnormality 06/18/2021   Memory loss 06/18/2021   Atherosclerosis of aorta ([place]) 06/09/2021   Type 2 diabetes mellitus with stage 3a chronic kidney disease, with long-term current use of insulin ([place]) 09/14/2020   Type 2 diabetes mellitus with retinopathy, with long-term current use of insulin ([place]) 09/14/2020   Diabetes mellitus ([place]) 09/14/2020   Type 2 diabetes mellitus with diabetic polyneuropathy, with long-term current use of insulin ([place]) 09/14/2020   Long term (current)  use of anticoagulants 07/13/2020   Persistent atrial fibrillation (HCC)    Mixed hyperlipidemia    Acute on chronic diastolic CHF (congestive heart failure), NYHA class 3 ([place])    Demand ischemia    Atrial fibrillation ([place]) 04/24/2020   Other fatigue 03/24/2020   Short of breath on exertion 03/24/2020   Congestive heart failure ([place]) 03/24/2020   Vitamin D deficiency 03/24/2020   Sleep apnea 05/21/2019   History of hepatitis C 05/21/2019   Insomnia 05/21/2019   CKD (chronic kidney disease) stage 4, GFR 15-29 ml/min (HCC) 05/21/2019   GERD (gastroesophageal reflux disease) 04/20/2018   Type 2 diabetes mellitus with diabetic neuropathy, unspecified ([place]) 12/19/2016   History of TIA (transient ischemic attack) 12/19/2016   Hyperlipidemia associated with type 2 diabetes mellitus ([place]) 12/19/2016   S/P total knee replacement using cement, right 03/18/2015   Non-obstructive CAD    Cardiovascular stress test abnormal    Pleuritic chest pain 01/16/2015   Acute renal failure superimposed on stage 3 chronic kidney disease ([place]) 01/16/2015   Obesity, Class III, BMI 40-49.9 (morbid obesity) ([place]) 01/16/2015   Essential hypertension 01/16/2015   Hypothyroidism 01/16/2015   OSA on CPAP 01/16/2015   DM type 2 (diabetes mellitus, type 2) ([place]) 01/16/2015   Coronary artery disease involving native coronary artery without angina pectoris 10/02/2014   Physical deconditioning 02/28/2013   Morbid obesity ([place]) 02/27/2013   Osteoarthritis 02/27/2013    ONSET DATE: several years   REFERRING DIAG:  R26.89 (ICD-10-CM) - Imbalance  THERAPY DIAG:  Unsteadiness on feet  Muscle weakness (generalized)  Other abnormalities of gait and mobility  Rationale for Evaluation and Treatment: Rehabilitation  SUBJECTIVE:                                                                                                                                                                                              SUBJECTIVE STATEMENT: Patient reports imbalance ever since she was diagnosed with Cardiac amyloid- which was several years ago. Had 6 falls last year- one of them resulting in falling on the  L shoulder and it still hurts once in a while. Reports most falls have occurred when walking and usually falls forward.  Reports hx of lightheadedness and has passed out before and hit her head in the summer of 2023. Using SPC inside and outside.   Pt accompanied by: self  PERTINENT HISTORY: CHF, DM, GERD, heart disease, HTN, mini stroke, B TKA, R TSA, permanent a-fib  PAIN:  Are you having pain? No  PRECAUTIONS: Fall  WEIGHT BEARING RESTRICTIONS: No  FALLS: Has patient fallen in last 6 months? Yes. Number of falls 1  LIVING ENVIRONMENT: Lives with: lives with their son Lives in: House/apartment Stairs:  no steps to enter; 2nd story home with handrail on R (reports son supervises stair navigation)  Has following equipment at home: Single point cane and Environmental consultant - 2 wheeled (reports she needs a shower chair)  PLOF: Independent with basic ADLs; electric scooter for shopping trips  PATIENT GOALS: work on balancing, being able to button clothes   OBJECTIVE:   DIAGNOSTIC FINDINGS: none recent  COGNITION: Overall cognitive status: Within functional limits for tasks assessed   SENSATION: WFL reports N/T in B thumb, index, middle finger  POSTURE: rounded shoulders  LOWER EXTREMITY ROM:     Active  Right Eval Left Eval  Hip flexion    Hip extension    Hip abduction    Hip adduction    Hip internal rotation    Hip external rotation    Knee flexion    Knee extension    Ankle dorsiflexion 21 7  Ankle plantarflexion    Ankle inversion    Ankle eversion     (Blank rows = not tested)  LOWER EXTREMITY MMT:    MMT (in sitting) Right Eval Left Eval  Hip flexion 4 4+  Hip extension    Hip abduction 4+ 4+  Hip adduction 4+ 4+  Hip internal rotation    Hip external rotation    Knee  flexion 4 4+  Knee extension 4 4+  Ankle dorsiflexion 4+ 4+  Ankle plantarflexion 4+ 4+  Ankle inversion    Ankle eversion    (Blank rows = not tested)   GAIT: Gait pattern: Short hesitant steps and with mild imbalance with turns; SPC too tall  Assistive device utilized: Single point cane Level of assistance: Modified independence   FUNCTIONAL TESTS:   [facility] PT Assessment - 01/10/23 0001       Standardized Balance Assessment   Standardized Balance Assessment Timed Up and Go Test;Five Times Sit to Stand;Berg Balance Test    Five times sit to stand comments  19.85 sec pushing off knees      Berg Balance Test   Sit to Stand Able to stand without using hands and stabilize independently    Standing Unsupported Able to stand safely 2 minutes    Sitting with Back Unsupported but Feet Supported on Floor or Stool Able to sit safely and securely 2 minutes    Stand to Sit Sits safely with minimal use of hands    Transfers Able to transfer safely, minor use of hands    Standing Unsupported with Eyes Closed Able to stand 10 seconds safely    Standing Unsupported with Feet Together Able to place feet together independently and stand 1 minute safely    From Standing, Reach Forward with Outstretched Arm Can reach confidently >25 cm (10")    From Standing Position, Pick up Object from Floor Able to pick up shoe, needs supervision  c/o lightheaded   From Standing Position, Turn to Look Behind Over each Shoulder Looks behind from both sides and weight shifts well    Turn 360 Degrees Able to turn 360 degrees safely but slowly    Standing Unsupported, Alternately Place Feet on Step/Stool Able to stand independently and complete 8 steps >20 seconds    Standing Unsupported, One Foot in Front Able to take small step independently and hold 30 seconds    Standing on One Leg Tries to lift leg/unable to hold 3 seconds but remains standing independently    Total Score 47      Timed Up and Go Test    Normal TUG (seconds) 19.01   with SPC             TODAY'S TREATMENT:                                                                                                                              DATE: 01/10/23   PATIENT EDUCATION: Education details: prognosis, POC, exam findings, edu on benefits of OT for trouble with dressing Person educated: Patient Education method: Explanation, Demonstration, Tactile cues, and Verbal cues Education comprehension: verbalized understanding  HOME EXERCISE PROGRAM: Next session  GOALS: Goals reviewed with patient? Yes  SHORT TERM GOALS: Target date: 01/31/2023  Patient to be independent with initial HEP. Baseline: HEP initiated Goal status: INITIAL    LONG TERM GOALS: Target date: 02/21/2023  Patient to be independent with advanced HEP. Baseline: Not yet initiated  Goal status: INITIAL  Patient to demonstrate B LE strength >/=4+/5.  Baseline: See above Goal status: INITIAL  Patient to demonstrate alternating reciprocal pattern when ascending and descending stairs with good stability and 1 handrail as needed.   Baseline: Unable Goal status: INITIAL  Patient to complete TUG in <14 sec with LRAD in order to decrease risk of falls.   Baseline: 19 sec Goal status: INITIAL  Patient to demonstrate 5xSTS test in <15 sec in order to decrease risk of falls.  Baseline: 19 sec Goal status: INITIAL  Patient to score at least 50/56 on Berg in order to decrease risk of falls.  Baseline: 47 Goal status: INITIAL   ASSESSMENT:  CLINICAL IMPRESSION:  Patient is a 82 y/o F presenting to OPPT with c/o imbalance and frequent falls for the past several years. Reports 6 falls last year- one resulting in head trauma. Patient also reports episodes of lightheadedness. Patient today presenting with slightly limited L ankle dorsiflexion AROM, R hip and knee weakness, gait deviations, increased time required for transfers and gait, and imbalance with  narrow BOS and SLS.Would benefit from skilled PT services 1-2 x/week for 6 weeks to address aforementioned impairments in order to optimize level of function.    OBJECTIVE IMPAIRMENTS: Abnormal gait, decreased balance, decreased endurance, difficulty walking, decreased ROM, decreased strength, dizziness, and pain.   ACTIVITY LIMITATIONS: carrying, lifting, bending, sitting, standing, squatting, stairs, transfers, bathing, toileting, dressing,  reach over head, and hygiene/grooming  PARTICIPATION LIMITATIONS: meal prep, cleaning, laundry, shopping, community activity, and church  PERSONAL FACTORS: Age, Fitness, Past/current experiences, Time since onset of injury/illness/exacerbation, and 3+ comorbidities: CHF, DM, GERD, heart disease, HTN, mini stroke, B TKA, R TSA, permanent a-fib  are also affecting patient's functional outcome.   REHAB POTENTIAL: Good  CLINICAL DECISION MAKING: Evolving/moderate complexity  EVALUATION COMPLEXITY: Moderate  PLAN:  PT FREQUENCY: 1-2x/week  PT DURATION: 6 weeks  PLANNED INTERVENTIONS: Therapeutic exercises, Therapeutic activity, Neuromuscular re-education, Balance training, Gait training, Patient/Family education, Self Care, Joint mobilization, Stair training, Vestibular training, Canalith repositioning, Aquatic Therapy, Dry Needling, Electrical stimulation, Cryotherapy, Moist heat, Taping, Manual therapy, and Re-evaluation  PLAN FOR NEXT SESSION: initiate HEP for hip flexor, quad, HS strengthening, higher level balance activities, narrow BOS, SLS   Janene Harvey, PT, DPT 01/10/23 4:26 PM  [facility] [address] [place], [place] [address] Phone # [phone] Fax # [phone]

## 2023-01-09 NOTE — Telephone Encounter (Signed)
Refill request for warfarin:  Last INR was 2.4 on 01/06/23 Next INR due 02/03/23 LOV was 01/06/23  Dorann Lodge MD

## 2023-01-10 ENCOUNTER — Telehealth: Payer: Self-pay | Admitting: Physical Therapy

## 2023-01-10 ENCOUNTER — Other Ambulatory Visit: Payer: Self-pay

## 2023-01-10 ENCOUNTER — Encounter: Payer: Self-pay | Admitting: Physical Therapy

## 2023-01-10 ENCOUNTER — Ambulatory Visit: Payer: Medicare Other | Attending: Cardiovascular Disease | Admitting: Physical Therapy

## 2023-01-10 DIAGNOSIS — R2689 Other abnormalities of gait and mobility: Secondary | ICD-10-CM | POA: Insufficient documentation

## 2023-01-10 DIAGNOSIS — M6281 Muscle weakness (generalized): Secondary | ICD-10-CM | POA: Diagnosis not present

## 2023-01-10 DIAGNOSIS — R2681 Unsteadiness on feet: Secondary | ICD-10-CM | POA: Diagnosis not present

## 2023-01-10 NOTE — Telephone Encounter (Signed)
I have placed the OT referral.   Thank you!

## 2023-01-10 NOTE — Telephone Encounter (Signed)
Hi Dr. Audie Box,  Ms. Hartlage was evaluated in [place] today for imbalance.  The patient would benefit from OT evaluation for fine motor deficits.    If you agree, please place an order in [facility] in [facility] or fax the order to [phone].  Thank you, Janene Harvey, PT, DPT 01/10/23 4:29 PM  [place] [address] [place], [place]  [address] Phone:  [phone] Fax:  [phone]

## 2023-01-11 NOTE — Telephone Encounter (Signed)
p 

## 2023-01-12 ENCOUNTER — Ambulatory Visit (INDEPENDENT_AMBULATORY_CARE_PROVIDER_SITE_OTHER): Payer: Medicare Other | Admitting: Pulmonary Disease

## 2023-01-12 ENCOUNTER — Encounter: Payer: Self-pay | Admitting: Pulmonary Disease

## 2023-01-12 VITALS — BP 110/68 | HR 100 | Ht 62.0 in | Wt 230.0 lb

## 2023-01-12 DIAGNOSIS — G4733 Obstructive sleep apnea (adult) (pediatric): Secondary | ICD-10-CM

## 2023-01-12 NOTE — Patient Instructions (Signed)
We will adjust your pressure to see if this helps  Change pressures from 4-20 to 4-10  I will see you back in about 3 months  Continue using current mask  Call us with significant concerns

## 2023-01-12 NOTE — Progress Notes (Signed)
Subjective:     Patient ID: Regina [name], female   DOB: 06/05/1941, 82 y.o.   MRN: [id number]  Patient with a history of obstructive sleep apnea History of lung nodules  She continues to try to use CPAP on a regular basis Feels her nose still gets sore in the morning  I do not believe this is a pressure issue She has tried different masks The current mask she is on now fits about the best when she is going to bed at night She does have some leakage Has been doing relatively well with the CPAP, has been having some problems with the mask, her nose feels sore   She feels she sleeps well  Sleeping well  CT scan on 02/17/2020 shows stable lung nodules  Shortness of breath with activity -Did talk about graded exercises -She is considering going back to the gym -She did receive all vaccines  Never smoker No pertinent occupational history  She has been hospitalized recently Has had at least 2 falls recently -History of atrial fibrillation with anticoagulation   Past Medical History:  Diagnosis Date   Back pain    CHF (congestive heart failure) ([place])    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 01/2015 Cardiolite: + inf wall ischemia, EF 56%;  b. 01/2015 Cath: LM nl, LAD 7m, LCX min irregs, RCA dominant, 50-3m.   Sleep apnea    Swallowing difficulty    Social History   Socioeconomic History   Marital status: Widowed    Spouse name: Not on file   Number of children: Not on file   Years of education: Not on file   Highest education level: Not on file  Occupational History   Occupation: Retired  Tobacco Use   Smoking status: Never   Smokeless tobacco: Never  Vaping Use   Vaping  Use: Never used  Substance and Sexual Activity   Alcohol use: No   Drug use: Never   Sexual activity: Not Currently  Other Topics Concern   Not on file  Social History Narrative   Lives with daughter   Social Determinants of Health   Financial Resource Strain: Low Risk  (11/30/2022)   Overall Financial Resource Strain (CARDIA)    Difficulty of Paying Living Expenses: Not very hard  Food Insecurity: No Food Insecurity (11/30/2022)   Hunger Vital Sign    Worried About Running Out of Food in the Last Year: Never true    Ran Out of Food in the Last Year: Never true  Transportation Needs: No Transportation Needs (11/30/2022)   PRAPARE - Hydrologist (Medical): No    Lack of Transportation (Non-Medical): No  Physical Activity: Inactive (02/16/2022)   Exercise Vital Sign    Days of Exercise per Week: 0 days    Minutes of Exercise per Session: 0 min  Stress: No Stress Concern Present (02/16/2022)   [place]    Feeling of Stress : Not at all  Social Connections: Moderately Integrated (02/16/2022)   Social Connection and Isolation Panel [NHANES]    Frequency of Communication with Friends and  Family: More than three times a week    Frequency of Social Gatherings with Friends and Family: More than three times a week    Attends Religious Services: More than 4 times per year    Active Member of Clubs or Organizations: Yes    Attends Archivist Meetings: More than 4 times per year    Marital Status: Widowed  Intimate Partner Violence: Not At Risk (02/16/2022)   Humiliation, Afraid, Rape, and Kick questionnaire    Fear of Current or Ex-Partner: No    Emotionally Abused: No    Physically Abused: No    Sexually Abused: No   Family History  Problem Relation Age of Onset   Heart disease Mother    Hypertension Mother    Cancer Father    Alcoholism Father      Review of Systems   Constitutional:  Negative for fever and unexpected weight change.  HENT:  Negative for congestion, dental problem, ear pain, nosebleeds, postnasal drip, rhinorrhea, sinus pressure, sneezing, sore throat and trouble swallowing.   Eyes:  Negative for redness and itching.  Respiratory:  Positive for shortness of breath. Negative for cough, chest tightness and wheezing.   Cardiovascular:  Negative for palpitations and leg swelling.  Gastrointestinal:  Negative for nausea and vomiting.  Genitourinary:  Negative for dysuria.  Musculoskeletal:  Negative for joint swelling.  Skin:  Negative for rash.  Allergic/Immunologic: Negative.  Negative for environmental allergies, food allergies and immunocompromised state.  Neurological:  Negative for headaches.  Hematological:  Does not bruise/bleed easily.  Psychiatric/Behavioral:  Negative for dysphoric mood. The patient is not nervous/anxious.        Objective:   Physical Exam Constitutional:      Appearance: She is obese.  HENT:     Head: Normocephalic.     Nose: Nose normal.     Mouth/Throat:     Mouth: Mucous membranes are moist.  Eyes:     General: No scleral icterus.    Pupils: Pupils are equal, round, and reactive to light.  Cardiovascular:     Rate and Rhythm: Normal rate and regular rhythm.     Pulses: Normal pulses.     Heart sounds: No murmur heard. Pulmonary:     Effort: Pulmonary effort is normal. No respiratory distress.     Breath sounds: Normal breath sounds. No stridor. No wheezing or rhonchi.  Musculoskeletal:     Cervical back: Normal range of motion and neck supple. No rigidity. No muscular tenderness.  Neurological:     Mental Status: She is alert.  Psychiatric:        Mood and Affect: Mood normal.    Vitals:   01/12/23 1358  BP: 110/68  Pulse: 100  SpO2: 99%   CT scan reviewed showing stable nodules  PFT shows no obstruction, no restriction  Recent CPAP compliance noted showing 80% use Average use of 3  hours 30 minutes Set between 4-20 Residual AHI of 2.7 She does have some leaks    Assessment:     Obstructive sleep apnea -Encouraged to continue CPAP use on a nightly basis -Will change pressure settings from 4-20 to 4-10  History of lung nodule that needed follow-up -Lung nodules are stable on last CT  Shortness of breath on exertion -Encouraged to continue graded exercise as tolerated  History of atrial fibrillation -With recent falls, risk versus benefits of anticoagulation need to be weighed -Risk of strokes may still outweigh bleeding at present  Plan:     Will make changes to CPAP pressures  I do not believe a retitration will be of help at present  Continue use of current mask as this is the one that fits the best for her  Risk of not treating sleep disordered breathing discussed  Weight loss efforts encouraged  Follow-up in 3 months  I spent 30 minutes dedicated to the care of this patient on the date of this encounter to include previsit review of records, face-to-face time with the patient discussing conditions above, post visit ordering of testing, clinical documentation with electronic health record and communicated necessary findings to members of the patient's care team

## 2023-01-16 NOTE — Therapy (Signed)
OUTPATIENT PHYSICAL THERAPY NEURO TREATMENT   Patient Name: Regina [name] MRN: [id number] DOB:12-24-1940, 82 y.o., female Today's Date: 01/17/2023   PCP: [name], [name], MD  REFERRING PROVIDER: Geralynn Rile, MD  END OF SESSION:  PT End of Session - 01/17/23 1528     Visit Number 2    Number of Visits 13    Date for PT Re-Evaluation 02/21/23    Authorization Type [employer] Medicare    PT Start Time 1449    PT Stop Time 1530    PT Time Calculation (min) 41 min    Equipment Utilized During Treatment Gait belt    Activity Tolerance Patient tolerated treatment well    Behavior During Therapy WFL for tasks assessed/performed              Past Medical History:  Diagnosis Date   Back pain    CHF (congestive heart failure)    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 01/2015 Cardiolite: + inf wall ischemia, EF 56%;  b. 01/2015 Cath: LM nl, LAD 44m, LCX min irregs, RCA dominant, 50-105m.   Sleep apnea    Swallowing difficulty    Past Surgical History:  Procedure Laterality Date   ABDOMINAL HYSTERECTOMY     BUBBLE STUDY  07/01/2020   Procedure: BUBBLE STUDY;  Surgeon: Elouise Munroe, MD;  Location: [place];  Service: Cardiovascular;;   BUBBLE STUDY  08/05/2020   Procedure: BUBBLE STUDY;  Surgeon: Geralynn Rile, MD;  Location: [place];  Service: Cardiovascular;;  done with definity    CARDIAC CATHETERIZATION     ESOPHAGOGASTRODUODENOSCOPY     a. 01/2015 EGD: patent esophagus.   ESOPHAGOGASTRODUODENOSCOPY N/A 01/20/2015   Procedure: ESOPHAGOGASTRODUODENOSCOPY (EGD);  Surgeon: Carol Ada, MD;  Location: [facility] ENDOSCOPY;  Service: Endoscopy;  Laterality: N/A;   HAND SURGERY      JOINT REPLACEMENT     reports history bilateral TKA and right TSA   LEFT HEART CATHETERIZATION WITH CORONARY ANGIOGRAM N/A 01/19/2015   RIGHT HEART CATH N/A 12/27/2021   Procedure: RIGHT HEART CATH;  Surgeon: Lorretta Harp, MD;  Location: [place];  Service: Cardiovascular;  Laterality: N/A;   TEE WITHOUT CARDIOVERSION  05/04/2020   TEE WITHOUT CARDIOVERSION N/A 05/05/2020   Procedure: TRANSESOPHAGEAL ECHOCARDIOGRAM (TEE);  Surgeon: Pixie Casino, MD;  Location: [facility] ENDOSCOPY;  Service: Cardiovascular;  Laterality: N/A;   TEE WITHOUT CARDIOVERSION N/A 07/01/2020   Procedure: TRANSESOPHAGEAL ECHOCARDIOGRAM (TEE);  Surgeon: Elouise Munroe, MD;  Location: [place];  Service: Cardiovascular;  Laterality: N/A;   TEE WITHOUT CARDIOVERSION N/A 08/05/2020   Procedure: TRANSESOPHAGEAL ECHOCARDIOGRAM (TEE);  Surgeon: Geralynn Rile, MD;  Location: [place];  Service: Cardiovascular;  Laterality: N/A;   THYROIDECTOMY     Patient Active Problem List   Diagnosis Date Noted   Multiple lipomas 10/04/2022   Dizziness, nonspecific 09/22/2022   Dysphagia 09/22/2022   Left shoulder pain 09/22/2022   Pain due to onychomycosis of toenail of left foot 02/11/2022   Chest pain 12/26/2021   Left atrial thrombus 12/25/2021   Wild-type transthyretin-related (ATTR) amyloidosis    Lung nodules 12/13/2021   Polymyalgia rheumatica 06/30/2021  Neck pain 06/18/2021   Gait abnormality 06/18/2021   Memory loss 06/18/2021   Atherosclerosis of aorta 06/09/2021   Type 2 diabetes mellitus with stage 3a chronic kidney disease, with long-term current use of insulin 09/14/2020   Type 2 diabetes mellitus with retinopathy, with long-term current use of insulin 09/14/2020   Diabetes mellitus 09/14/2020   Type 2 diabetes mellitus with diabetic polyneuropathy, with long-term current use of insulin 09/14/2020   Long term (current) use of anticoagulants 07/13/2020   Persistent atrial fibrillation     Mixed hyperlipidemia    Acute on chronic diastolic CHF (congestive heart failure), NYHA class 3    Demand ischemia    Atrial fibrillation 04/24/2020   Other fatigue 03/24/2020   Short of breath on exertion 03/24/2020   Congestive heart failure 03/24/2020   Vitamin D deficiency 03/24/2020   Sleep apnea 05/21/2019   History of hepatitis C 05/21/2019   Insomnia 05/21/2019   CKD (chronic kidney disease) stage 4, GFR 15-29 ml/min 05/21/2019   GERD (gastroesophageal reflux disease) 04/20/2018   Type 2 diabetes mellitus with diabetic neuropathy, unspecified 12/19/2016   History of TIA (transient ischemic attack) 12/19/2016   Hyperlipidemia associated with type 2 diabetes mellitus 12/19/2016   S/P total knee replacement using cement, right 03/18/2015   Non-obstructive CAD    Cardiovascular stress test abnormal    Pleuritic chest pain 01/16/2015   Acute renal failure superimposed on stage 3 chronic kidney disease 01/16/2015   Obesity, Class III, BMI 40-49.9 (morbid obesity) 01/16/2015   Essential hypertension 01/16/2015   Hypothyroidism 01/16/2015   OSA on CPAP 01/16/2015   DM type 2 (diabetes mellitus, type 2) 01/16/2015   Coronary artery disease involving native coronary artery without angina pectoris 10/02/2014   Physical deconditioning 02/28/2013   Morbid obesity 02/27/2013   Osteoarthritis 02/27/2013    ONSET DATE: several years   REFERRING DIAG:  R26.89 (ICD-10-CM) - Imbalance  THERAPY DIAG:  Unsteadiness on feet  Muscle weakness (generalized)  Other abnormalities of gait and mobility  Rationale for Evaluation and Treatment: Rehabilitation  SUBJECTIVE:                                                                                                                                                                                             SUBJECTIVE STATEMENT: Nothing new. Been sleepy today. Denies recent falls.   Pt accompanied by: self  PERTINENT HISTORY: CHF, DM,  GERD, heart disease, HTN, mini stroke, B TKA, R TSA, permanent a-fib  PAIN:  Are you having pain? No  PRECAUTIONS: Fall  WEIGHT BEARING RESTRICTIONS: No  FALLS: Has patient fallen in last  6 months? Yes. Number of falls 1  LIVING ENVIRONMENT: Lives with: lives with their son Lives in: House/apartment Stairs:  no steps to enter; 2nd story home with handrail on R (reports son supervises stair navigation)  Has following equipment at home: Single point cane and Environmental consultant - 2 wheeled (reports she needs a shower chair)  PLOF: Independent with basic ADLs; electric scooter for shopping trips  PATIENT GOALS: work on balancing, being able to button clothes   OBJECTIVE:    TODAY'S TREATMENT: 01/17/23    Orthostatic Testing   Supine Sitting Standing  x1 Minute Standing x 3 Minutes  BP 115/55 mmHg 123/60 mmHg 124/75 mmHg 120/75 mmHg  HR 90 bpm 84 bpm 93 bpm 80 bpm    Activity Comments  Romberg EO/EC 30" Good stability   Romberg + head turns/nods 30"  Mild sway  alt toe tap to cone 3x10 Weaning to no UE support and only using for stability as needed  mini squat 2x10 Good form  hip ABD/EX 2x10 each Elevated shoulders/guarded Ues; co fatigue      HOME EXERCISE PROGRAM Last updated: 01/17/23 Access Code: [email or web address] URL: [email or web address] Date: 01/17/2023 Prepared by: [facility]  Program Notes perform at counter top for safety  Exercises - Romberg Stance with Eyes Closed  - 1 x daily - 5 x weekly - 2 sets - 30 sec hold - Romberg Stance with Head Nods  - 1 x daily - 5 x weekly - 2 sets - 30 sec hold - Romberg Stance with Head Rotation  - 1 x daily - 5 x weekly - 2 sets - 30 sec hold - Standing Toe Taps  - 1 x daily - 5 x weekly - 2 sets - 10 reps   PATIENT EDUCATION: Education details: HEP update- to be performed at counter for safety Person educated: Patient Education method: Explanation, Demonstration, Tactile cues, Verbal  cues, and Handouts Education comprehension: verbalized understanding and returned demonstration   Below measures were taken at time of initial evaluation unless otherwise specified:    DIAGNOSTIC FINDINGS: none recent  COGNITION: Overall cognitive status: Within functional limits for tasks assessed   SENSATION: WFL reports N/T in B thumb, index, middle finger  POSTURE: rounded shoulders  LOWER EXTREMITY ROM:     Active  Right Eval Left Eval  Hip flexion    Hip extension    Hip abduction    Hip adduction    Hip internal rotation    Hip external rotation    Knee flexion    Knee extension    Ankle dorsiflexion 21 7  Ankle plantarflexion    Ankle inversion    Ankle eversion     (Blank rows = not tested)  LOWER EXTREMITY MMT:    MMT (in sitting) Right Eval Left Eval  Hip flexion 4 4+  Hip extension    Hip abduction 4+ 4+  Hip adduction 4+ 4+  Hip internal rotation    Hip external rotation    Knee flexion 4 4+  Knee extension 4 4+  Ankle dorsiflexion 4+ 4+  Ankle plantarflexion 4+ 4+  Ankle inversion    Ankle eversion    (Blank rows = not tested)   GAIT: Gait pattern: Short hesitant steps and with mild imbalance with turns; SPC too tall  Assistive device utilized: Single point cane Level of assistance: Modified independence   FUNCTIONAL TESTS:      TODAY'S TREATMENT:  DATE: 01/10/23   PATIENT EDUCATION: Education details: prognosis, POC, exam findings, edu on benefits of OT for trouble with dressing Person educated: Patient Education method: Explanation, Demonstration, Tactile cues, and Verbal cues Education comprehension: verbalized understanding  HOME EXERCISE PROGRAM: Next session  GOALS: Goals reviewed with patient? Yes  SHORT TERM GOALS: Target date: 01/31/2023  Patient to be independent with initial  HEP. Baseline: HEP initiated Goal status: IN PROGRESS    LONG TERM GOALS: Target date: 02/21/2023  Patient to be independent with advanced HEP. Baseline: Not yet initiated  Goal status: IN PROGRESS  Patient to demonstrate B LE strength >/=4+/5.  Baseline: See above Goal status: IN PROGRESS  Patient to demonstrate alternating reciprocal pattern when ascending and descending stairs with good stability and 1 handrail as needed.   Baseline: Unable Goal status: IN PROGRESS  Patient to complete TUG in <14 sec with LRAD in order to decrease risk of falls.   Baseline: 19 sec Goal status: IN PROGRESS  Patient to demonstrate 5xSTS test in <15 sec in order to decrease risk of falls.  Baseline: 19 sec Goal status: IN PROGRESS  Patient to score at least 50/56 on Berg in order to decrease risk of falls.  Baseline: 47 Goal status: IN PROGRESS   ASSESSMENT:  CLINICAL IMPRESSION: Patient arrived to session without new complaints. Orthostatic testing was negative. Patient performed static and dynamic balance activities which revealed only mild sway. Patient somewhat hesitant to reduce reliance on UEs wit balance activity; ultimately able to wean UE support for more challenge. Worked on LE strengthening activities which was somewhat limited by fatigue. Patient tolerated session well despite fatigue and no other complaints noted at end of session.    OBJECTIVE IMPAIRMENTS: Abnormal gait, decreased balance, decreased endurance, difficulty walking, decreased ROM, decreased strength, dizziness, and pain.   ACTIVITY LIMITATIONS: carrying, lifting, bending, sitting, standing, squatting, stairs, transfers, bathing, toileting, dressing, reach over head, and hygiene/grooming  PARTICIPATION LIMITATIONS: meal prep, cleaning, laundry, shopping, community activity, and church  PERSONAL FACTORS: Age, Fitness, Past/current experiences, Time since onset of injury/illness/exacerbation, and 3+ comorbidities:  CHF, DM, GERD, heart disease, HTN, mini stroke, B TKA, R TSA, permanent a-fib  are also affecting patient's functional outcome.   REHAB POTENTIAL: Good  CLINICAL DECISION MAKING: Evolving/moderate complexity  EVALUATION COMPLEXITY: Moderate  PLAN:  PT FREQUENCY: 1-2x/week  PT DURATION: 6 weeks  PLANNED INTERVENTIONS: Therapeutic exercises, Therapeutic activity, Neuromuscular re-education, Balance training, Gait training, Patient/Family education, Self Care, Joint mobilization, Stair training, Vestibular training, Canalith repositioning, Aquatic Therapy, Dry Needling, Electrical stimulation, Cryotherapy, Moist heat, Taping, Manual therapy, and Re-evaluation  PLAN FOR NEXT SESSION: review HEP; hip flexor, quad, HS strengthening, higher level balance activities, narrow BOS, SLS   Janene Harvey, PT, DPT 01/17/23 3:43 PM  [facility] [address] [place], [place] [address] Phone # [phone] Fax # [phone]

## 2023-01-17 ENCOUNTER — Ambulatory Visit: Payer: Medicare Other | Attending: Cardiovascular Disease | Admitting: Physical Therapy

## 2023-01-17 ENCOUNTER — Encounter: Payer: Self-pay | Admitting: Physical Therapy

## 2023-01-17 DIAGNOSIS — M6281 Muscle weakness (generalized): Secondary | ICD-10-CM | POA: Insufficient documentation

## 2023-01-17 DIAGNOSIS — R2681 Unsteadiness on feet: Secondary | ICD-10-CM | POA: Diagnosis not present

## 2023-01-17 DIAGNOSIS — R2689 Other abnormalities of gait and mobility: Secondary | ICD-10-CM | POA: Diagnosis not present

## 2023-01-19 ENCOUNTER — Ambulatory Visit: Payer: Medicare Other

## 2023-01-19 DIAGNOSIS — M6281 Muscle weakness (generalized): Secondary | ICD-10-CM

## 2023-01-19 DIAGNOSIS — R2681 Unsteadiness on feet: Secondary | ICD-10-CM | POA: Diagnosis not present

## 2023-01-19 DIAGNOSIS — R2689 Other abnormalities of gait and mobility: Secondary | ICD-10-CM | POA: Diagnosis not present

## 2023-01-19 NOTE — Therapy (Addendum)
 OUTPATIENT PHYSICAL THERAPY NEURO TREATMENT   Patient Name: Regina [name] MRN: [id number] DOB:[birth year], 82 y.o., female Today's Date: 01/19/2023   PCP: [place], [name], MD  REFERRING PROVIDER: Sande Rives, MD  END OF SESSION:  PT End of Session - 01/19/23 1448     Visit Number 3    Number of Visits 13    Date for PT Re-Evaluation 02/21/23    Authorization Type [employer] Medicare    PT Start Time 1445    PT Stop Time 1530    PT Time Calculation (min) 45 min    Equipment Utilized During Treatment Gait belt    Activity Tolerance Patient tolerated treatment well    Behavior During Therapy WFL for tasks assessed/performed              Past Medical History:  Diagnosis Date   Back pain    CHF (congestive heart failure)    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 01/2015 Cardiolite: + inf wall ischemia, EF 56%;  b. 01/2015 Cath: LM nl, LAD 75m, LCX min irregs, RCA dominant, 50-48m.   Sleep apnea    Swallowing difficulty    Past Surgical History:  Procedure Laterality Date   ABDOMINAL HYSTERECTOMY     BUBBLE STUDY  07/01/2020   Procedure: BUBBLE STUDY;  Surgeon: Parke Poisson, MD;  Location: [facility] ENDOSCOPY;  Service: Cardiovascular;;   BUBBLE STUDY  08/05/2020   Procedure: BUBBLE STUDY;  Surgeon: Sande Rives, MD;  Location: [facility] ENDOSCOPY;  Service: Cardiovascular;;  done with definity    CARDIAC CATHETERIZATION     ESOPHAGOGASTRODUODENOSCOPY     a. 01/2015 EGD: patent esophagus.   ESOPHAGOGASTRODUODENOSCOPY N/A 01/20/2015   Procedure: ESOPHAGOGASTRODUODENOSCOPY (EGD);  Surgeon: Jeani Hawking, MD;  Location: [facility] ENDOSCOPY;  Service: Endoscopy;  Laterality: N/A;   HAND SURGERY      JOINT REPLACEMENT     reports history bilateral TKA and right TSA   LEFT HEART CATHETERIZATION WITH CORONARY ANGIOGRAM N/A 01/19/2015   RIGHT HEART CATH N/A 12/27/2021   Procedure: RIGHT HEART CATH;  Surgeon: Runell Gess, MD;  Location: [facility] INVASIVE CV LAB;  Service: Cardiovascular;  Laterality: N/A;   TEE WITHOUT CARDIOVERSION  05/04/2020   TEE WITHOUT CARDIOVERSION N/A 05/05/2020   Procedure: TRANSESOPHAGEAL ECHOCARDIOGRAM (TEE);  Surgeon: Chrystie Nose, MD;  Location: [facility] ENDOSCOPY;  Service: Cardiovascular;  Laterality: N/A;   TEE WITHOUT CARDIOVERSION N/A 07/01/2020   Procedure: TRANSESOPHAGEAL ECHOCARDIOGRAM (TEE);  Surgeon: Parke Poisson, MD;  Location: [facility] ENDOSCOPY;  Service: Cardiovascular;  Laterality: N/A;   TEE WITHOUT CARDIOVERSION N/A 08/05/2020   Procedure: TRANSESOPHAGEAL ECHOCARDIOGRAM (TEE);  Surgeon: Sande Rives, MD;  Location: [facility] ENDOSCOPY;  Service: Cardiovascular;  Laterality: N/A;   THYROIDECTOMY     Patient Active Problem List   Diagnosis Date Noted   Multiple lipomas 10/04/2022   Dizziness, nonspecific 09/22/2022   Dysphagia 09/22/2022   Left shoulder pain 09/22/2022   Pain due to onychomycosis of toenail of left foot 02/11/2022   Chest pain 12/26/2021   Left atrial thrombus 12/25/2021   Wild-type transthyretin-related (ATTR) amyloidosis    Lung nodules 12/13/2021   Polymyalgia rheumatica 06/30/2021  Neck pain 06/18/2021   Gait abnormality 06/18/2021   Memory loss 06/18/2021   Atherosclerosis of aorta 06/09/2021   Type 2 diabetes mellitus with stage 3a chronic kidney disease, with long-term current use of insulin 09/14/2020   Type 2 diabetes mellitus with retinopathy, with long-term current use of insulin 09/14/2020   Diabetes mellitus 09/14/2020   Type 2 diabetes mellitus with diabetic polyneuropathy, with long-term current use of insulin 09/14/2020   Long term (current) use of anticoagulants 07/13/2020   Persistent atrial fibrillation     Mixed hyperlipidemia    Acute on chronic diastolic CHF (congestive heart failure), NYHA class 3    Demand ischemia    Atrial fibrillation 04/24/2020   Other fatigue 03/24/2020   Short of breath on exertion 03/24/2020   Congestive heart failure 03/24/2020   Vitamin D deficiency 03/24/2020   Sleep apnea 05/21/2019   History of hepatitis C 05/21/2019   Insomnia 05/21/2019   CKD (chronic kidney disease) stage 4, GFR 15-29 ml/min 05/21/2019   GERD (gastroesophageal reflux disease) 04/20/2018   Type 2 diabetes mellitus with diabetic neuropathy, unspecified 12/19/2016   History of TIA (transient ischemic attack) 12/19/2016   Hyperlipidemia associated with type 2 diabetes mellitus 12/19/2016   S/P total knee replacement using cement, right 03/18/2015   Non-obstructive CAD    Cardiovascular stress test abnormal    Pleuritic chest pain 01/16/2015   Acute renal failure superimposed on stage 3 chronic kidney disease 01/16/2015   Obesity, Class III, BMI 40-49.9 (morbid obesity) 01/16/2015   Essential hypertension 01/16/2015   Hypothyroidism 01/16/2015   OSA on CPAP 01/16/2015   DM type 2 (diabetes mellitus, type 2) 01/16/2015   Coronary artery disease involving native coronary artery without angina pectoris 10/02/2014   Physical deconditioning 02/28/2013   Morbid obesity 02/27/2013   Osteoarthritis 02/27/2013    ONSET DATE: several years   REFERRING DIAG:  R26.89 (ICD-10-CM) - Imbalance  THERAPY DIAG:  Unsteadiness on feet  Muscle weakness (generalized)  Other abnormalities of gait and mobility  Rationale for Evaluation and Treatment: Rehabilitation  SUBJECTIVE:                                                                                                                                                                                             SUBJECTIVE STATEMENT: Nothing new. Been sleepy today. Denies recent falls.   Pt accompanied by: self  PERTINENT HISTORY: CHF, DM,  GERD, heart disease, HTN, mini stroke, B TKA, R TSA, permanent a-fib  PAIN:  Are you having pain? No  PRECAUTIONS: Fall  WEIGHT BEARING RESTRICTIONS: No  FALLS: Has patient fallen in last  6 months? Yes. Number of falls 1  LIVING ENVIRONMENT: Lives with: lives with their son Lives in: House/apartment Stairs:  no steps to enter; 2nd story home with handrail on R (reports son supervises stair navigation)  Has following equipment at home: Single point cane and Environmental consultant - 2 wheeled (reports she needs a shower chair)  PLOF: Independent with basic ADLs; electric scooter for shopping trips  PATIENT GOALS: work on balancing, being able to button clothes   OBJECTIVE:   TODAY'S TREATMENT: 01/19/23 Activity Comments  HEP review   Sidestepping x 2 min At counter  Mini-squats 2x10 At counter  Standing hip abd 2x10 At counter  NU-step level 4 x 8 min For CV conditioning, monitoring vitals demo 96% and 92 bpm during efforts. Goal of maintaining 50+ SPM  Retrowalking 1x45 ft Slow, but steady      HOME EXERCISE PROGRAM Last updated: 01/17/23 Access Code: [email or web address] URL: [email or web address] Date: 01/17/2023 Prepared by: [facility] - Outpatient  Rehab - [facility]  Program Notes perform at counter top for safety  Exercises - Romberg Stance with Eyes Closed  - 1 x daily - 5 x weekly - 2 sets - 30 sec hold - Romberg Stance with Head Nods  - 1 x daily - 5 x weekly - 2 sets - 30 sec hold - Romberg Stance with Head Rotation  - 1 x daily - 5 x weekly - 2 sets - 30 sec hold - Standing Toe Taps  - 1 x daily - 5 x weekly - 2 sets - 10 reps   PATIENT EDUCATION: Education details: HEP update- to be performed at counter for safety Person educated: Patient Education method: Explanation, Demonstration, Tactile cues, Verbal cues, and Handouts Education comprehension: verbalized understanding and returned demonstration   Below measures were taken at time of initial evaluation  unless otherwise specified:    DIAGNOSTIC FINDINGS: none recent  COGNITION: Overall cognitive status: Within functional limits for tasks assessed   SENSATION: WFL reports N/T in B thumb, index, middle finger  POSTURE: rounded shoulders  LOWER EXTREMITY ROM:     Active  Right Eval Left Eval  Hip flexion    Hip extension    Hip abduction    Hip adduction    Hip internal rotation    Hip external rotation    Knee flexion    Knee extension    Ankle dorsiflexion 21 7  Ankle plantarflexion    Ankle inversion    Ankle eversion     (Blank rows = not tested)  LOWER EXTREMITY MMT:    MMT (in sitting) Right Eval Left Eval  Hip flexion 4 4+  Hip extension    Hip abduction 4+ 4+  Hip adduction 4+ 4+  Hip internal rotation    Hip external rotation    Knee flexion 4 4+  Knee extension 4 4+  Ankle dorsiflexion 4+ 4+  Ankle plantarflexion 4+ 4+  Ankle inversion    Ankle eversion    (Blank rows = not tested)   GAIT: Gait pattern: Short hesitant steps and with mild imbalance with turns; SPC too tall  Assistive device utilized: Single point cane Level of assistance: Modified independence   FUNCTIONAL TESTS:      TODAY'S TREATMENT:  DATE: 01/10/23   PATIENT EDUCATION: Education details: prognosis, POC, exam findings, edu on benefits of OT for trouble with dressing Person educated: Patient Education method: Explanation, Demonstration, Tactile cues, and Verbal cues Education comprehension: verbalized understanding  HOME EXERCISE PROGRAM: Next session  GOALS: Goals reviewed with patient? Yes  SHORT TERM GOALS: Target date: 01/31/2023  Patient to be independent with initial HEP. Baseline: HEP initiated Goal status: IN PROGRESS    LONG TERM GOALS: Target date: 02/21/2023  Patient to be independent with advanced HEP. Baseline: Not yet  initiated  Goal status: IN PROGRESS  Patient to demonstrate B LE strength >/=4+/5.  Baseline: See above Goal status: IN PROGRESS  Patient to demonstrate alternating reciprocal pattern when ascending and descending stairs with good stability and 1 handrail as needed.   Baseline: Unable Goal status: IN PROGRESS  Patient to complete TUG in <14 sec with LRAD in order to decrease risk of falls.   Baseline: 19 sec Goal status: IN PROGRESS  Patient to demonstrate 5xSTS test in <15 sec in order to decrease risk of falls.  Baseline: 19 sec Goal status: IN PROGRESS  Patient to score at least 50/56 on Berg in order to decrease risk of falls.  Baseline: 47 Goal status: IN PROGRESS   ASSESSMENT:  CLINICAL IMPRESSION: Returns to clinic and notes HEP compliance with excellent return demonstration.  Pt reports attempting stair taps using a cup but has found that to be too difficult.  Pt notes she has a stool at her bedside to enter/exit bed and will attempt using this. Continued with activities to improve general activity tolerance and dynamic balance to reduce risk for falls. Pt reports transportation difficulties for a 2x/wk PT session and requests to change to 1x/wk to accommodate.  Continued sessions to meet POC requirements   OBJECTIVE IMPAIRMENTS: Abnormal gait, decreased balance, decreased endurance, difficulty walking, decreased ROM, decreased strength, dizziness, and pain.   ACTIVITY LIMITATIONS: carrying, lifting, bending, sitting, standing, squatting, stairs, transfers, bathing, toileting, dressing, reach over head, and hygiene/grooming  PARTICIPATION LIMITATIONS: meal prep, cleaning, laundry, shopping, community activity, and church  PERSONAL FACTORS: Age, Fitness, Past/current experiences, Time since onset of injury/illness/exacerbation, and 3+ comorbidities: CHF, DM, GERD, heart disease, HTN, mini stroke, B TKA, R TSA, permanent a-fib  are also affecting patient's functional  outcome.   REHAB POTENTIAL: Good  CLINICAL DECISION MAKING: Evolving/moderate complexity  EVALUATION COMPLEXITY: Moderate  PLAN:  PT FREQUENCY: 1-2x/week  PT DURATION: 6 weeks  PLANNED INTERVENTIONS: Therapeutic exercises, Therapeutic activity, Neuromuscular re-education, Balance training, Gait training, Patient/Family education, Self Care, Joint mobilization, Stair training, Vestibular training, Canalith repositioning, Aquatic Therapy, Dry Needling, Electrical stimulation, Cryotherapy, Moist heat, Taping, Manual therapy, and Re-evaluation  PLAN FOR NEXT SESSION: add sidestepping and retrowalking at counter for HEP, activities for endurance.    3:36 PM, 01/19/23 M. Shary Decamp, PT, DPT Physical Therapist- [facility] Office Number: [phone]    PHYSICAL THERAPY DISCHARGE SUMMARY  Visits from Start of Care: 3  Current functional level related to goals / functional outcomes: Unable to assess; pt did not return   Remaining deficits: Unable to assess   Education / Equipment: HEP  Plan: Patient agrees to discharge.  Patient goals were not met. Patient is being discharged due to not returning.    Baldemar Friday, PT, DPT 01/01/24 3:41 PM  [facility] [address] [place], [address] [place], [place] [address] Phone # [phone] Fax # [phone]

## 2023-01-24 ENCOUNTER — Ambulatory Visit: Payer: Medicare Other

## 2023-01-25 ENCOUNTER — Other Ambulatory Visit (HOSPITAL_COMMUNITY): Payer: Self-pay

## 2023-01-26 ENCOUNTER — Ambulatory Visit: Payer: Medicare Other | Admitting: Physical Therapy

## 2023-01-26 ENCOUNTER — Other Ambulatory Visit (HOSPITAL_COMMUNITY): Payer: Self-pay

## 2023-01-31 ENCOUNTER — Ambulatory Visit: Payer: Medicare Other | Admitting: Internal Medicine

## 2023-01-31 ENCOUNTER — Encounter: Payer: Self-pay | Admitting: Internal Medicine

## 2023-01-31 ENCOUNTER — Ambulatory Visit: Payer: Medicare Other | Admitting: Physical Therapy

## 2023-01-31 VITALS — BP 118/70 | HR 73 | Ht 62.0 in | Wt 232.0 lb

## 2023-01-31 DIAGNOSIS — N1831 Chronic kidney disease, stage 3a: Secondary | ICD-10-CM

## 2023-01-31 DIAGNOSIS — E1140 Type 2 diabetes mellitus with diabetic neuropathy, unspecified: Secondary | ICD-10-CM | POA: Diagnosis not present

## 2023-01-31 DIAGNOSIS — E1122 Type 2 diabetes mellitus with diabetic chronic kidney disease: Secondary | ICD-10-CM

## 2023-01-31 DIAGNOSIS — E11319 Type 2 diabetes mellitus with unspecified diabetic retinopathy without macular edema: Secondary | ICD-10-CM

## 2023-01-31 DIAGNOSIS — E1159 Type 2 diabetes mellitus with other circulatory complications: Secondary | ICD-10-CM

## 2023-01-31 DIAGNOSIS — Z794 Long term (current) use of insulin: Secondary | ICD-10-CM

## 2023-01-31 LAB — POCT GLYCOSYLATED HEMOGLOBIN (HGB A1C): Hemoglobin A1C: 7.7 % — AB (ref 4.0–5.6)

## 2023-01-31 MED ORDER — TIRZEPATIDE 2.5 MG/0.5ML ~~LOC~~ SOAJ: 2.5000 mg | SUBCUTANEOUS | 3 refills | Status: DC

## 2023-01-31 NOTE — Patient Instructions (Signed)
-   Start Mounjaro 2.5 mg once weekly  - Decrease Toujeo  20 units daily  - Humalog correctional insulin: Use the scale below to help guide you BEFORE each meal   Blood sugar before meal Number of units to inject  80- 165 3 unit  166 - 200 4 units  201 - 235 5 units  236 - 235 6 units  236 -270 7 units  271 - 305 8 units  306 - 340 9 units  341 - 375 10 units  376 - 410 11 units     HOW TO TREAT LOW BLOOD SUGARS (Blood sugar LESS THAN 70 MG/DL) Please follow the RULE OF 15 for the treatment of hypoglycemia treatment (when your (blood sugars are less than 70 mg/dL)   STEP 1: Take 15 grams of carbohydrates when your blood sugar is low, which includes:  3-4 GLUCOSE TABS  OR 3-4 OZ OF JUICE OR REGULAR SODA OR ONE TUBE OF GLUCOSE GEL    STEP 2: RECHECK blood sugar in 15 MINUTES STEP 3: If your blood sugar is still low at the 15 minute recheck --> then, go back to STEP 1 and treat AGAIN with another 15 grams of carbohydrates.

## 2023-01-31 NOTE — Progress Notes (Signed)
Name: Regina [name]  Age/ Sex: 82 y.o., female   MRN/ DOB: [id number], 08-Oct-1941     PCP: [place], Regina [name]   Reason for Endocrinology Evaluation: Type 2 Diabetes Mellitus  Initial Endocrine Consultative Visit: 09/09/2020    PATIENT IDENTIFIER: [name] is a 82 y.o. female with a past medical history of T2DM, HTN, CAD, OSA on CPAP  and A.Fib. The patient has followed with Endocrinology clinic since 09/09/2020 for consultative assistance with management of her diabetes.  DIABETIC HISTORY:  [name] was diagnosed with DM yrs ago. Her hemoglobin A1c has ranged from 6.9% in 2021, peaking at 7.8% in 2020   On her initial visit to our clinic she had an A1c of 7.4% She was on basal insulin and Ozemopic, she was already out of Ozempic and we held off until more data was available about her retinopathy. We started humalog per correction scale   She was started on Farxiga by cardiology 04/2021   Nephrology [facility] - Dr. peoples  SUBJECTIVE:   During the last visit (08/01/2022): A1c 7.5 %    Today (01/31/2023): [name] is here for a follow up on diabetes management.. She checks her blood sugars multiple times a day through CGM . The patient has had hypoglycemic episodes since the last clinic visit which typically occur a night   She is off prednisone for Polymyagia rheumatica She is undergoing physical therapy She had a follow-up with pulmonology for OSA 12/2022 She had a follow-up with cardiology for cardiac amyloid 12/2022   No nausea , vomiting or diarrhea  She is c/o weight gain     HOME DIABETES REGIMEN:  Toujeo 22 units daily  Humalog 3 TIDQAM  CF: Humalog: ( BG-130/35) Farxiga 10 mg daily-through cardiology    Statin: yes ACE-I/ARB: no    CONTINUOUS GLUCOSE MONITORING RECORD INTERPRETATION    Dates of Recording: 3/20-01/17/2023  Sensor description:dexcom  Results statistics:   CGM use % of time 94  Average and SD 172/27.6   Time in range 62 %  % Time Above 180 31  % Time above 250 7  % Time Below target 0     Glycemic patterns summary: Bg's optimal overnight and increase during the day   Hyperglycemic episodes  postprandial   Hypoglycemic episodes occurred n/a  Overnight periods: Optimal     DIABETIC COMPLICATIONS: Microvascular complications:  CKD III, Retinopathy ( hx of laser )  Denies: neuropathy  Last eye exam: Completed 2021   Macrovascular complications:  Non-obstructive CAD  Denies: PVD, CVA     HISTORY:  Past Medical History:  Past Medical History:  Diagnosis Date   Back pain    CHF (congestive heart failure)    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 01/2015 Cardiolite: + inf wall ischemia, EF 56%;  b. 01/2015 Cath: LM nl, LAD 52m, LCX min irregs, RCA dominant, 50-42m.   Sleep apnea    Swallowing difficulty    Past Surgical History:  Past Surgical History:  Procedure Laterality Date   ABDOMINAL HYSTERECTOMY     BUBBLE STUDY  07/01/2020   Procedure: BUBBLE STUDY;  Surgeon: Weston Brass  A, [name];  Location: [facility] ENDOSCOPY;  Service: Cardiovascular;;   BUBBLE STUDY  08/05/2020   Procedure: BUBBLE STUDY;  Surgeon: Sande Rives, [name];  Location: [facility] ENDOSCOPY;  Service: Cardiovascular;;  done with definity    CARDIAC CATHETERIZATION     ESOPHAGOGASTRODUODENOSCOPY     a. 01/2015 EGD: patent esophagus.   ESOPHAGOGASTRODUODENOSCOPY N/A 01/20/2015   Procedure: ESOPHAGOGASTRODUODENOSCOPY (EGD);  Surgeon: Jeani Hawking, [name];  Location: [facility] ENDOSCOPY;  Service: Endoscopy;  Laterality: N/A;   HAND SURGERY     JOINT REPLACEMENT     reports history bilateral TKA and right TSA   LEFT HEART CATHETERIZATION WITH  CORONARY ANGIOGRAM N/A 01/19/2015   RIGHT HEART CATH N/A 12/27/2021   Procedure: RIGHT HEART CATH;  Surgeon: Runell Gess, [name];  Location: [facility] INVASIVE CV LAB;  Service: Cardiovascular;  Laterality: N/A;   TEE WITHOUT CARDIOVERSION  05/04/2020   TEE WITHOUT CARDIOVERSION N/A 05/05/2020   Procedure: TRANSESOPHAGEAL ECHOCARDIOGRAM (TEE);  Surgeon: Chrystie Nose, [name];  Location: [facility] ENDOSCOPY;  Service: Cardiovascular;  Laterality: N/A;   TEE WITHOUT CARDIOVERSION N/A 07/01/2020   Procedure: TRANSESOPHAGEAL ECHOCARDIOGRAM (TEE);  Surgeon: Parke Poisson, [name];  Location: [facility] ENDOSCOPY;  Service: Cardiovascular;  Laterality: N/A;   TEE WITHOUT CARDIOVERSION N/A 08/05/2020   Procedure: TRANSESOPHAGEAL ECHOCARDIOGRAM (TEE);  Surgeon: Sande Rives, [name];  Location: [facility] ENDOSCOPY;  Service: Cardiovascular;  Laterality: N/A;   THYROIDECTOMY     Social History:  reports that she has never smoked. She has never used smokeless tobacco. She reports that she does not drink alcohol and does not use drugs. Family History:  Family History  Problem Relation Age of Onset   Heart disease Mother    Hypertension Mother    Cancer Father    Alcoholism Father      HOME MEDICATIONS: Allergies as of 01/31/2023       Reactions   Ace Inhibitors Other (See Comments)   unknown   Amlodipine Other (See Comments)   unknown   Atenolol Other (See Comments)   bradycardia   Avandia [rosiglitazone] Other (See Comments)   unknown   Darvon [propoxyphene] Other (See Comments)   unknown   Erythromycin Itching   Hydralazine Other (See Comments)   Burning in throat and chest   Hydrocodone Other (See Comments)   Hallucinations.   Levofloxacin Itching   Morphine And Related Other (See Comments)   Dizzy and hallucianation, vomiting; Willing to try low dose   Percocet [oxycodone-acetaminophen] Other (See Comments)   hallucination   Spironolactone Other (See Comments)   unknown   Tramadol Other (See Comments)    Unknown/does not recall reaction but does not want to take again        Medication List        Accurate as of January 31, 2023  2:16 PM. If you have any questions, ask your nurse or doctor.          STOP taking these medications    Dexcom G6 Transmitter Misc Stopped by: Scarlette Shorts, [name]       TAKE these medications    Accu-Chek FastClix Lancet Kit Use to test blood sugar up to 3 times daily   Accu-Chek Guide test strip Generic drug: glucose blood Use to test blood sugar up to 3 times daily   Accu-Chek Guide w/Device Kit 1 Device by Does not apply route 3 (three) times daily.   acetaminophen 500 MG tablet Commonly known as: TYLENOL Take 1,000 mg by mouth every 6 (six)  hours as needed for mild pain.   allopurinol 100 MG tablet Commonly known as: ZYLOPRIM TAKE ONE TABLET BY MOUTH EVERY MORNING   aspirin EC 81 MG tablet Take 1 tablet (81 mg total) by mouth daily. Swallow whole.   atorvastatin 20 MG tablet Commonly known as: LIPITOR TAKE ONE TABLET BY MOUTH EVERYDAY AT BEDTIME   B-D SINGLE USE SWABS REGULAR Pads Use to test blood sugar up to 3 times daily   benzonatate 200 MG capsule Commonly known as: TESSALON Take 1 capsule (200 mg total) by mouth every 6 (six) hours as needed for cough.   famotidine 20 MG tablet Commonly known as: PEPCID Take 1 tablet (20 mg total) by mouth at bedtime.   Farxiga 10 MG Tabs tablet Generic drug: dapagliflozin propanediol TAKE ONE TABLET BY MOUTH ONCE DAILY   Fluocinolone Acetonide 0.01 % Oil instill one drop in ears every 2-3 DAYS AS NEEDED FOR pruritis   FreeStyle Libre 2 Reader [facility] Use as instructed to check blood sugar.   FreeStyle Libre 2 Sensor Misc Change  sensors every 14 days   gabapentin 600 MG tablet Commonly known as: NEURONTIN TAKE ONE TABLET BY MOUTH EVERY MORNING and TAKE ONE TABLET BY MOUTH EVERYDAY AT BEDTIME   insulin lispro 100 UNIT/ML KwikPen Commonly known as: HumaLOG  KwikPen Max daily 30 units   Insulin Pen Needle 32G X 4 MM Misc 1 Device by Does not apply route in the morning, at noon, in the evening, and at bedtime.   ipratropium 0.06 % nasal spray Commonly known as: ATROVENT Place 2 sprays into both nostrils 4 (four) times daily. What changed: additional instructions   ketoconazole 2 % shampoo Commonly known as: Nizoral Apply 1 application. topically 2 (two) times a week.   levothyroxine 150 MCG tablet Commonly known as: SYNTHROID TAKE ONE TABLET BY MOUTH BEFORE BREAKFAST   magnesium oxide 400 MG tablet Commonly known as: MAG-OX TAKE ONE TABLET BY MOUTH EVERY MORNING   metoprolol succinate 25 MG 24 hr tablet Commonly known as: TOPROL-XL TAKE ONE TABLET BY MOUTH ONCE DAILY with OR immedaitely following A meal   MIRALAX PO Take 1 Package by mouth daily as needed (constipation).   multivitamin tablet Take 1 tablet by mouth daily. K FREE DAILY once daily (MVI with no vitamin K)   nitroGLYCERIN 0.4 MG SL tablet Commonly known as: NITROSTAT Dissolve 1 tab under tongue as needed for chest pain. May repeat every 5 minutes x 2 doses. If no relief call 9-1-1.   pantoprazole 20 MG tablet Commonly known as: PROTONIX TAKE ONE TABLET BY MOUTH ONCE DAILY   potassium chloride SA 20 MEQ tablet Commonly known as: KLOR-CON M TAKE ONE TABLET BY MOUTH ONCE DAILY   SALINE MIST SPRAY NA Place 2 sprays into the nose at bedtime.   SYSTANE BALANCE OP Place 1 drop into both eyes daily as needed (for dry eyes).   tirzepatide 2.5 MG/0.5ML Pen Commonly known as: MOUNJARO Inject 2.5 mg into the skin once a week. Started by: Scarlette Shorts, [name]   torsemide 20 MG tablet Commonly known as: DEMADEX Take 2 tablets (40 mg total) by mouth daily.   torsemide 20 MG tablet Commonly known as: DEMADEX Take as directed   Toujeo SoloStar 300 UNIT/ML Solostar Pen Generic drug: insulin glargine (1 Unit Dial) Inject 22 Units into the skin daily. Eat a  snack with protein nightly before bedtime.   trolamine salicylate 10 % cream Commonly known as: ASPERCREME Apply 1 application.  topically as needed for muscle pain.   Vitamin D3 50 MCG (2000 UT) capsule Take 2,000 Units by mouth daily.   Vyndamax 61 MG Caps Generic drug: Tafamidis Take 1 capsule (61 mg total) by mouth daily.   [place] 45 MG/0.8ML Soaj Generic drug: Eplontersen Sodium Inject 45 mg into the skin every 30 (thirty) days.   warfarin 5 MG tablet Commonly known as: COUMADIN Take as directed by the anticoagulation clinic. If you are unsure how to take this medication, talk to your nurse or doctor. Original instructions: TAKE 1/2 TABLET BY MOUTH daily OR as directed by coumadin clinic         OBJECTIVE:   Vital Signs: BP 118/70 (BP Location: Left Arm, Patient Position: Sitting, Cuff Size: Large)   Pulse 73   Ht  (1.575 m)   Wt 232 lb (105.2 kg)   SpO2 98%   BMI 42.43 kg/m   Wt Readings from Last 3 Encounters:  01/31/23 232 lb (105.2 kg)  01/12/23 230 lb (104.3 kg)  01/06/23 226 lb (102.5 kg)     Exam: General: Pt appears well and is in NAD  Lungs: Clear with good BS bilat   Heart: RRR   Extremities: No pretibial edema.   Neuro: MS is good with appropriate affect, pt is alert and Ox3     DM foot exam: 01/31/2023   The skin of the feet is intact without sores or ulcerations. The pedal pulses are 1+ B/L The sensation is decreased to a screening 5.07, 10 gram monofilament bilaterally   DATA REVIEWED:  Lab Results  Component Value Date   HGBA1C 7.7 (A) 01/31/2023   HGBA1C 7.5 (A) 08/01/2022   HGBA1C 7.3 (A) 01/31/2022     Latest Reference Range & Units 01/06/23 15:46  Sodium 134 - 144 mmol/L 141  Potassium 3.5 - 5.2 mmol/L 4.4  Chloride 96 - 106 mmol/L 98  CO2 20 - 29 mmol/L 26  Glucose 70 - 99 mg/dL 161 (H)  BUN 8 - 27 mg/dL 41 (H)  Creatinine 0.96 - 1.00 mg/dL 0.45 (H)  Calcium 8.7 - 10.3 mg/dL 40.9  BUN/Creatinine Ratio 12 - 28  22   eGFR >59 mL/min/1.73 27 (L)    ASSESSMENT / PLAN / RECOMMENDATIONS:   1) Type 2 Diabetes Mellitus, Optimally controlled, With retinopathic, neuropathic , CKD IV and Macrovascular   complications - Most recent A1c of 7.7 %. Goal A1c < 7.5 %.    -A1c remains stable  -She is on Farxiga through cardiology - We discussed adding Glp-1 agonist to help with weight gain and optimizing glucose control , she was on Ozempic and Trulicity in the past that she was getting through the weight loss clinic, but stopped going  - We cautioned against GI side effects to include nausea and diarrhea, will start Mounjaro, if this is not covered, will consider Ozempic    MEDICATIONS: Start Mounjaro 2.5 mg weekly  Decrease Toujeo  20 units daily  Continue Humalog 3 units with each meal  Continue  correction Scale  : Humalog ( BG -130/35) Patient on Farxiga 10 mg through  cardiology    EDUCATION / INSTRUCTIONS: BG monitoring instructions: Patient is instructed to check her blood sugars 3 times a day, before meals . Call [place] [facility] if: BG persistently < 70  I reviewed the Rule of 15 for the treatment of hypoglycemia in detail with the patient. Literature supplied.      2) Diabetic complications:  Eye: Does  have known diabetic retinopathy.  Neuro/ Feet: Does have known diabetic peripheral neuropathy. Renal: Patient does have known baseline CKD. She is not on an ACEI/ARB at present.      F/U in 6 months    Signed electronically by: Lyndle Herrlich, [name]  [facility] Endocrinology  [facility] [address] [place], [place] [address] Phone: [phone] FAX: [phone]   CC: [place], Regina [name] [address] [place] [address] Phone: [phone]  Fax: [phone]  Return to Endocrinology clinic as below: Future Appointments  Date Time Provider Department Center  02/03/2023  2:15 PM [facility] None   02/03/2023  3:45 PM [facility] 3 [facility]  02/07/2023  2:45 PM Anette Guarneri D, PT [facility]  02/16/2023  2:45 PM Anette Guarneri D, PT [facility]  02/20/2023 10:00 AM [facility]-ANNUAL WELLNESS VISIT [facility]  02/23/2023  2:45 PM Anette Guarneri D, PT [facility]  03/22/2023  2:00 PM Herring, Milas Kocher, RPH [facility] None  04/03/2023  3:40 PM O'Neal, Ronnald Ramp, [name] [facility] None

## 2023-02-01 ENCOUNTER — Encounter: Payer: Self-pay | Admitting: Internal Medicine

## 2023-02-02 ENCOUNTER — Ambulatory Visit: Payer: Medicare Other | Admitting: Physical Therapy

## 2023-02-03 ENCOUNTER — Telehealth: Payer: Self-pay

## 2023-02-03 ENCOUNTER — Other Ambulatory Visit: Payer: Self-pay | Admitting: Cardiovascular Disease

## 2023-02-03 ENCOUNTER — Ambulatory Visit (INDEPENDENT_AMBULATORY_CARE_PROVIDER_SITE_OTHER): Payer: Medicare Other

## 2023-02-03 ENCOUNTER — Ambulatory Visit (HOSPITAL_COMMUNITY): Payer: Medicare Other | Attending: Cardiology

## 2023-02-03 DIAGNOSIS — I4891 Unspecified atrial fibrillation: Secondary | ICD-10-CM | POA: Insufficient documentation

## 2023-02-03 DIAGNOSIS — I4819 Other persistent atrial fibrillation: Secondary | ICD-10-CM

## 2023-02-03 DIAGNOSIS — Z7901 Long term (current) use of anticoagulants: Secondary | ICD-10-CM

## 2023-02-03 DIAGNOSIS — I5022 Chronic systolic (congestive) heart failure: Secondary | ICD-10-CM | POA: Insufficient documentation

## 2023-02-03 LAB — POCT INR: INR: 2.2 (ref 2.0–3.0)

## 2023-02-03 LAB — ECHOCARDIOGRAM COMPLETE: S' Lateral: 3.6 cm

## 2023-02-03 NOTE — Patient Instructions (Signed)
Description   Continue taking warfarin 1/2 tablet daily. Repeat INR in 6 weeks; Anticoagulation Clinic [phone]

## 2023-02-03 NOTE — Progress Notes (Signed)
Care Management & Coordination Services Pharmacy Team  Reason for Encounter: Medication coordination and delivery  Contacted patient to discuss medications and coordinate delivery from [facility]. Unsuccessful outreach. Left voicemail for patient to return call.  Multiple attempts  Cycle dispensing form sent to [employer] for review.   Last adherence delivery date: 01/18/2023      Patient is due for next adherence delivery on: 02/16/2023  This delivery to include: Adherence Packaging  30 Days  Warfarin 5 mg tablet (easy top vials) use as directed Allopurinol 100 mg 1 tablet at breakfast Klor-Con 20 meq - 1 tablet at breakfast Protonix 20 mg - 1 tablet at breakfast Aspirin 81 mg - 1 tablet at breakfast Magnesium oxide 400 mg 1 tablet at breakfast Cetirizine 10 mg 1 tablet at breakfast (OTC med) Atorvastatin 20 mg 1 tablet at bedtime Farxiga 10 mg 1 tablet at breakfast Metoprolol succinate 25 mg 1 tablet at breakfast Gabapentin 600 mg tablet  1 tablet at breakfast and 1 tablet at bedtime Levothyroxine 150 mcg 1 tablet before breakfast Torsemide 20 mg take 2 tablets at breakfast Pen needles 32 g X 4 mm   Patient declined the following medications this month: Humalog kwikpen 100 un - inject 30 units max dose daily Toujeo solostar 300 un - inject 22 units daily (next fill date 01/16/23) Nitrostat 0.4mg  - 1 tablet every 5 min as needed   Fluocinolone Acetonide 0.01 % OIL (ear drop) Ketoconazole (NIZORAL) 2 % shampoo  Delivery scheduled for 02/16/2023. Unable to speak with patient to confirm date.   Any concerns about your medications?   How often do you forget or accidentally miss a dose?   Is patient in packaging Yes  If yes  What is the date on your next pill pack?  Any concerns or issues with your packaging?   Recent blood pressure readings are as follows:  Recent blood glucose readings are as follows:   Chart review: Recent office visits:   None  Recent consult visits:  [facility] MD(endo) - Patient was seen for Type 2 diabetes mellitus with diabetic neuropathy, with long-term current use of insulin and additional concerns. Started Tirzepatide 2.5 mg weekly  01/12/2023 Adewale Olalere MD(pulmonary) - Patient was seen for OSA on CPAP. No medication changes.   01/06/2023 Velna Ochs (cardiology) - Patient was seen for Hereditary cardiac amyloidosis and additional concerns.   Hospital visits:  None  Medications: Outpatient Encounter Medications as of 02/03/2023  Medication Sig   acetaminophen (TYLENOL) 500 MG tablet Take 1,000 mg by mouth every 6 (six) hours as needed for mild pain.   Alcohol Swabs (B-D SINGLE USE SWABS REGULAR) PADS Use to test blood sugar up to 3 times daily   allopurinol (ZYLOPRIM) 100 MG tablet TAKE ONE TABLET BY MOUTH EVERY MORNING   aspirin EC 81 MG EC tablet Take 1 tablet (81 mg total) by mouth daily. Swallow whole.   atorvastatin (LIPITOR) 20 MG tablet TAKE ONE TABLET BY MOUTH EVERYDAY AT BEDTIME   benzonatate (TESSALON) 200 MG capsule Take 1 capsule (200 mg total) by mouth every 6 (six) hours as needed for cough.   Blood Glucose Monitoring Suppl (ACCU-CHEK GUIDE) w/Device KIT 1 Device by Does not apply route 3 (three) times daily.   Cholecalciferol (VITAMIN D3) 50 MCG (2000 UT) capsule Take 2,000 Units by mouth daily.   Continuous Blood Gluc Receiver (FREESTYLE LIBRE 2 READER) DEVI Use as instructed to check blood sugar.   Continuous Blood Gluc Sensor (FREESTYLE LIBRE  2 SENSOR) MISC Change  sensors every 14 days   Eplontersen Sodium (WAINUA) 45 MG/0.8ML SOAJ Inject 45 mg into the skin every 30 (thirty) days.   famotidine (PEPCID) 20 MG tablet Take 1 tablet (20 mg total) by mouth at bedtime.   FARXIGA 10 MG TABS tablet TAKE ONE TABLET BY MOUTH ONCE DAILY   Fluocinolone Acetonide 0.01 % OIL instill one drop in ears every 2-3 DAYS AS NEEDED FOR pruritis   gabapentin (NEURONTIN) 600 MG tablet  TAKE ONE TABLET BY MOUTH EVERY MORNING and TAKE ONE TABLET BY MOUTH EVERYDAY AT BEDTIME   glucose blood (ACCU-CHEK GUIDE) test strip Use to test blood sugar up to 3 times daily   insulin glargine, 1 Unit Dial, (TOUJEO SOLOSTAR) 300 UNIT/ML Solostar Pen Inject 22 Units into the skin daily. Eat a snack with protein nightly before bedtime.   insulin lispro (HUMALOG KWIKPEN) 100 UNIT/ML KwikPen Max daily 30 units   Insulin Pen Needle 32G X 4 MM MISC 1 Device by Does not apply route in the morning, at noon, in the evening, and at bedtime.   ipratropium (ATROVENT) 0.06 % nasal spray Place 2 sprays into both nostrils 4 (four) times daily. (Patient taking differently: Place 2 sprays into both nostrils 4 (four) times daily. As needed)   ketoconazole (NIZORAL) 2 % shampoo Apply 1 application. topically 2 (two) times a week.   Lancets Misc. (ACCU-CHEK FASTCLIX LANCET) KIT Use to test blood sugar up to 3 times daily   levothyroxine (SYNTHROID) 150 MCG tablet TAKE ONE TABLET BY MOUTH BEFORE BREAKFAST   magnesium oxide (MAG-OX) 400 MG tablet TAKE ONE TABLET BY MOUTH EVERY MORNING   metoprolol succinate (TOPROL-XL) 25 MG 24 hr tablet TAKE ONE TABLET BY MOUTH ONCE DAILY with OR immedaitely following A meal   Multiple Vitamin (MULTIVITAMIN) tablet Take 1 tablet by mouth daily. K FREE DAILY once daily (MVI with no vitamin K)   nitroGLYCERIN (NITROSTAT) 0.4 MG SL tablet Dissolve 1 tab under tongue as needed for chest pain. May repeat every 5 minutes x 2 doses. If no relief call 9-1-1.   pantoprazole (PROTONIX) 20 MG tablet TAKE ONE TABLET BY MOUTH ONCE DAILY   Polyethylene Glycol 3350 (MIRALAX PO) Take 1 Package by mouth daily as needed (constipation).   potassium chloride SA (KLOR-CON M) 20 MEQ tablet TAKE ONE TABLET BY MOUTH ONCE DAILY   Propylene Glycol (SYSTANE BALANCE OP) Place 1 drop into both eyes daily as needed (for dry eyes).   SALINE MIST SPRAY NA Place 2 sprays into the nose at bedtime.   Tafamidis 61 MG  CAPS Take 1 capsule (61 mg total) by mouth daily.   tirzepatide [facility]) 2.5 MG/0.5ML Pen Inject 2.5 mg into the skin once a week.   torsemide (DEMADEX) 20 MG tablet Take 2 tablets (40 mg total) by mouth daily.   torsemide (DEMADEX) 20 MG tablet Take as directed   trolamine salicylate (ASPERCREME) 10 % cream Apply 1 application. topically as needed for muscle pain.   warfarin (COUMADIN) 5 MG tablet TAKE 1/2 TABLET BY MOUTH daily OR as directed by coumadin clinic   No facility-administered encounter medications on file as of 02/03/2023.   BP Readings from Last 3 Encounters:  01/31/23 118/70  01/12/23 110/68  01/06/23 (!) 108/56    Pulse Readings from Last 3 Encounters:  01/31/23 73  01/12/23 100  01/06/23 90    Lab Results  Component Value Date/Time   HGBA1C 7.7 (A) 01/31/2023 01:37 PM  HGBA1C 7.5 (A) 08/01/2022 01:34 PM   HGBA1C 7.9 (H) 08/11/2021 08:15 AM   HGBA1C 7.2 (H) 06/18/2021 11:10 AM   Lab Results  Component Value Date   CREATININE 1.86 (H) 01/06/2023   BUN 41 (H) 01/06/2023   GFR 24.47 (L) 08/13/2021   GFRNONAA 23 (L) 01/12/2022   GFRAA 26 10/20/2021   NA 141 01/06/2023   K 4.4 01/06/2023   CALCIUM 10.0 01/06/2023   CO2 26 01/06/2023   Inetta Fermo CMA  Clinical Pharmacist Assistant [phone]

## 2023-02-05 ENCOUNTER — Other Ambulatory Visit: Payer: Self-pay

## 2023-02-05 ENCOUNTER — Emergency Department (HOSPITAL_COMMUNITY): Payer: Medicare Other

## 2023-02-05 ENCOUNTER — Emergency Department (HOSPITAL_COMMUNITY)
Admission: EM | Admit: 2023-02-05 | Discharge: 2023-02-06 | Disposition: A | Discharge status: Home / Self Care | Payer: Medicare Other | Attending: Emergency Medicine | Admitting: Emergency Medicine

## 2023-02-05 ENCOUNTER — Encounter (HOSPITAL_COMMUNITY): Payer: Self-pay | Admitting: *Deleted

## 2023-02-05 DIAGNOSIS — Z7982 Long term (current) use of aspirin: Secondary | ICD-10-CM | POA: Insufficient documentation

## 2023-02-05 DIAGNOSIS — I5023 Acute on chronic systolic (congestive) heart failure: Secondary | ICD-10-CM | POA: Diagnosis not present

## 2023-02-05 DIAGNOSIS — Z79899 Other long term (current) drug therapy: Secondary | ICD-10-CM | POA: Insufficient documentation

## 2023-02-05 DIAGNOSIS — Z794 Long term (current) use of insulin: Secondary | ICD-10-CM | POA: Insufficient documentation

## 2023-02-05 DIAGNOSIS — R0602 Shortness of breath: Secondary | ICD-10-CM | POA: Diagnosis not present

## 2023-02-05 DIAGNOSIS — N1831 Chronic kidney disease, stage 3a: Secondary | ICD-10-CM | POA: Insufficient documentation

## 2023-02-05 DIAGNOSIS — E1122 Type 2 diabetes mellitus with diabetic chronic kidney disease: Secondary | ICD-10-CM | POA: Insufficient documentation

## 2023-02-05 DIAGNOSIS — E039 Hypothyroidism, unspecified: Secondary | ICD-10-CM | POA: Diagnosis not present

## 2023-02-05 DIAGNOSIS — I130 Hypertensive heart and chronic kidney disease with heart failure and stage 1 through stage 4 chronic kidney disease, or unspecified chronic kidney disease: Secondary | ICD-10-CM | POA: Insufficient documentation

## 2023-02-05 DIAGNOSIS — R2689 Other abnormalities of gait and mobility: Secondary | ICD-10-CM | POA: Diagnosis not present

## 2023-02-05 DIAGNOSIS — R2681 Unsteadiness on feet: Secondary | ICD-10-CM | POA: Diagnosis not present

## 2023-02-05 DIAGNOSIS — I110 Hypertensive heart disease with heart failure: Secondary | ICD-10-CM | POA: Diagnosis not present

## 2023-02-05 DIAGNOSIS — R2243 Localized swelling, mass and lump, lower limb, bilateral: Secondary | ICD-10-CM | POA: Diagnosis not present

## 2023-02-05 DIAGNOSIS — M6281 Muscle weakness (generalized): Secondary | ICD-10-CM | POA: Diagnosis not present

## 2023-02-05 LAB — CBC WITH DIFFERENTIAL/PLATELET
Abs Immature Granulocytes: 0.02 10*3/uL (ref 0.00–0.07)
Basophils Absolute: 0 10*3/uL (ref 0.0–0.1)
Basophils Relative: 1 %
Eosinophils Absolute: 0.1 10*3/uL (ref 0.0–0.5)
Eosinophils Relative: 2 %
HCT: 39.8 % (ref 36.0–46.0)
Hemoglobin: 12.8 g/dL (ref 12.0–15.0)
Immature Granulocytes: 0 %
Lymphocytes Relative: 21 %
Lymphs Abs: 1.3 10*3/uL (ref 0.7–4.0)
MCH: 29.6 pg (ref 26.0–34.0)
MCHC: 32.2 g/dL (ref 30.0–36.0)
MCV: 92.1 fL (ref 80.0–100.0)
Monocytes Absolute: 0.6 10*3/uL (ref 0.1–1.0)
Monocytes Relative: 10 %
Neutro Abs: 4.1 10*3/uL (ref 1.7–7.7)
Neutrophils Relative %: 66 %
Platelets: 258 10*3/uL (ref 150–400)
RBC: 4.32 MIL/uL (ref 3.87–5.11)
RDW: 16.1 % — ABNORMAL HIGH (ref 11.5–15.5)
WBC: 6.3 10*3/uL (ref 4.0–10.5)
nRBC: 0 % (ref 0.0–0.2)

## 2023-02-05 LAB — BASIC METABOLIC PANEL
Anion gap: 13 (ref 5–15)
BUN: 36 mg/dL — ABNORMAL HIGH (ref 8–23)
CO2: 27 mmol/L (ref 22–32)
Calcium: 9 mg/dL (ref 8.9–10.3)
Chloride: 99 mmol/L (ref 98–111)
Creatinine, Ser: 1.96 mg/dL — ABNORMAL HIGH (ref 0.44–1.00)
GFR, Estimated: 26 mL/min — ABNORMAL LOW (ref >60)
Glucose, Bld: 130 mg/dL — ABNORMAL HIGH (ref 70–99)
Potassium: 3.7 mmol/L (ref 3.5–5.1)
Sodium: 139 mmol/L (ref 135–145)

## 2023-02-05 LAB — PROTIME-INR
INR: 2.4 — ABNORMAL HIGH (ref 0.8–1.2)
Prothrombin Time: 26.3 seconds — ABNORMAL HIGH (ref 11.4–15.2)

## 2023-02-05 LAB — BRAIN NATRIURETIC PEPTIDE: B Natriuretic Peptide: 750.8 pg/mL — ABNORMAL HIGH (ref 0.0–100.0)

## 2023-02-05 MED ORDER — FUROSEMIDE 10 MG/ML IJ SOLN
60.0000 mg | Freq: Once | INTRAMUSCULAR | Status: AC
  Administered 2023-02-05: 60 mg via INTRAVENOUS
  Filled 2023-02-05: qty 6

## 2023-02-05 NOTE — ED Triage Notes (Signed)
The pt has had sob  feet and legs swollen  a 10 lb wait gain in a week  her abd has been swollen she just came from church here

## 2023-02-06 ENCOUNTER — Telehealth: Payer: Self-pay | Admitting: Cardiovascular Disease

## 2023-02-06 ENCOUNTER — Telehealth: Payer: Self-pay | Admitting: Pharmacist

## 2023-02-06 DIAGNOSIS — I5022 Chronic systolic (congestive) heart failure: Secondary | ICD-10-CM

## 2023-02-06 MED ORDER — TORSEMIDE 20 MG PO TABS
20.0000 mg | ORAL_TABLET | Freq: Two times a day (BID) | ORAL | 3 refills | Status: DC
Start: 2023-02-06 — End: 2023-02-10

## 2023-02-06 NOTE — ED Provider Notes (Signed)
12:13 AM Assumed care from Dr. Eudelia Bunch, please see their note for full history, physical and decision making until this point. In brief this is a 82 y.o. year old female who presented to the ED tonight with Shortness of Breath     Fluid overload. Had 60 lasix. Pending reeval for disposition.   Urinated multiple times. Stands up, moves around. Slow but similar to baseline. No hypoxia or tachypnea. Will plan for increased torsemide for the next few days pending follow up. Rtn here for new/worsening symptoms.   Discharge instructions, including strict return precautions for new or worsening symptoms, given. Patient and/or family verbalized understanding and agreement with the plan as described.   Labs, studies and imaging reviewed by myself and considered in medical decision making if ordered. Imaging interpreted by radiology.  Labs Reviewed  CBC WITH DIFFERENTIAL/PLATELET - Abnormal; Notable for the following components:      Result Value   RDW 16.1 (*)    All other components within normal limits  BASIC METABOLIC PANEL - Abnormal; Notable for the following components:   Glucose, Bld 130 (*)    BUN 36 (*)    Creatinine, Ser 1.96 (*)    GFR, Estimated 26 (*)    All other components within normal limits  BRAIN NATRIURETIC PEPTIDE - Abnormal; Notable for the following components:   B Natriuretic Peptide 750.8 (*)    All other components within normal limits  PROTIME-INR - Abnormal; Notable for the following components:   Prothrombin Time 26.3 (*)    INR 2.4 (*)    All other components within normal limits    DG Chest 2 View  Final Result      No follow-ups on file.    Rudell Ortman, Barbara Cower, MD 02/06/23 [phone]

## 2023-02-06 NOTE — Telephone Encounter (Signed)
Spoke with pahrmacy(Amanda) and they needed new rx sent over for toresmide  with directions. Rx sent over on 4/19 only stated take as directed. New Rx sent with directions toresmide  2 tabs ( ) daily.

## 2023-02-06 NOTE — ED Notes (Signed)
Pt ambulated. O2 sats did not drop below 100.

## 2023-02-06 NOTE — ED Provider Notes (Signed)
[place] [facility]ERGENC[facility] DEPARTMENT AT [facility] Provider Note  CSN: [id number] Arrival date & time: 02/05/23 1823  Chief Complaint(s) Shortness of Breath  HPI Regina [name] is a 82 y.o. female with a past medical history listed below including CHF with a last EF of 40%, reportedly down from 50% noted in cardiology's last note from March 22. Patient does have a history of A-fib on Coumadin. Patient reports over the past several days she has had increased dyspnea on exertion.  This became worse earlier in the day while at church.  Patient reports that she is compliant with her torsemide.  Denies any associated chest pain.  No recent fevers or infections.  No coughing or congestion.    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 01/2015 Cardiolite: + inf wall ischemia, EF 56%;  b. 01/2015 Cath: LM nl, LAD 67m, LCX min irregs, RCA dominant, 50-58m.   Sleep apnea    Swallowing difficulty    Patient Active Problem List   Diagnosis Date Noted   Multiple lipomas 10/04/2022   Dizziness, nonspecific 09/22/2022   Dysphagia 09/22/2022   Left shoulder pain 09/22/2022   Pain due to onychomycosis of toenail of left foot 02/11/2022   Chest pain 12/26/2021   Left atrial thrombus 12/25/2021   Wild-type transthyretin-related (ATTR) amyloidosis    Lung nodules 12/13/2021   Polymyalgia rheumatica 06/30/2021   Neck pain 06/18/2021   Gait abnormality 06/18/2021   Memory loss 06/18/2021   Atherosclerosis of aorta 06/09/2021   Type 2 diabetes mellitus with stage 3a chronic kidney disease,  with long-term current use of insulin 09/14/2020   Type 2 diabetes mellitus with retinopathy, with long-term current use of insulin 09/14/2020   Diabetes mellitus 09/14/2020   Type 2 diabetes mellitus with diabetic polyneuropathy, with long-term current use of insulin 09/14/2020   Long term (current) use of anticoagulants 07/13/2020   Persistent atrial fibrillation    Mixed hyperlipidemia    Acute on chronic diastolic CHF (congestive heart failure), NYHA class 3    Demand ischemia    Atrial fibrillation 04/24/2020   Other fatigue 03/24/2020   Short of breath on exertion 03/24/2020   Congestive heart failure 03/24/2020   Vitamin D deficiency 03/24/2020   Sleep apnea 05/21/2019   History of hepatitis C 05/21/2019   Insomnia 05/21/2019   CKD (chronic kidney disease) stage 4, GFR 15-29 ml/min 05/21/2019   GERD (gastroesophageal reflux disease) 04/20/2018   Type 2 diabetes mellitus with diabetic neuropathy, unspecified 12/19/2016   History of TIA (transient ischemic attack) 12/19/2016   Hyperlipidemia associated with type 2 diabetes mellitus 12/19/2016   S/P total knee replacement using cement, right 03/18/2015   Non-obstructive CAD    Cardiovascular stress test abnormal    Pleuritic chest pain 01/16/2015   Acute renal failure superimposed on stage 3 chronic kidney disease 01/16/2015   Obesity, Class III, BMI 40-49.9 (morbid obesity) 01/16/2015   Essential hypertension 01/16/2015   Hypothyroidism 01/16/2015   OSA on CPAP 01/16/2015  DM type 2 (diabetes mellitus, type 2) 01/16/2015   Coronary artery disease involving native coronary artery without angina pectoris 10/02/2014   Physical deconditioning 02/28/2013   Morbid obesity 02/27/2013   Osteoarthritis 02/27/2013   Home Medication(s) Prior to Admission medications   Medication Sig Start Date End Date Taking? Authorizing Provider  acetaminophen (TYLENOL) 500 MG tablet Take 1,000 mg by mouth every 6 (six) hours as needed for mild  pain.    [provider]  Alcohol Swabs (B-D SINGLE USE SWABS REGULAR) PADS Use to test blood sugar up to 3 times daily 01/11/21   Shamleffer, Konrad Dolores, MD  allopurinol (ZYLOPRIM) 100 MG tablet TAKE ONE TABLET BY MOUTH EVERY MORNING 11/11/22   [place], [name], MD  aspirin EC 81 MG EC tablet Take 1 tablet (81 mg total) by mouth daily. Swallow whole. 12/29/21   Pokhrel, Rebekah [place], MD  atorvastatin (LIPITOR) 20 MG tablet TAKE ONE TABLET BY MOUTH EVERYDAY AT BEDTIME 09/13/22   [place], [name], MD  benzonatate (TESSALON) 200 MG capsule Take 1 capsule (200 mg total) by mouth every 6 (six) hours as needed for cough. 10/14/22   Nelwyn Salisbury, MD  Blood Glucose Monitoring Suppl (ACCU-CHEK GUIDE) w/Device KIT 1 Device by Does not apply route 3 (three) times daily. 09/15/22   Shamleffer, Konrad Dolores, MD  Cholecalciferol (VITAMIN D3) 50 MCG (2000 UT) capsule Take 2,000 Units by mouth daily.    [provider]  Continuous Blood Gluc Receiver (FREESTYLE LIBRE 2 READER) DEVI Use as instructed to check blood sugar. 12/12/22   Shamleffer, Konrad Dolores, MD  Continuous Blood Gluc Sensor (FREESTYLE LIBRE 2 SENSOR) MISC Change  sensors every 14 days 12/06/22   Shamleffer, Konrad Dolores, MD  Eplontersen Sodium (WAINUA) 45 MG/0.8ML SOAJ Inject 45 mg into the skin every 30 (thirty) days. 11/28/22   O'NealRonnald Ramp, MD  famotidine (PEPCID) 20 MG tablet Take 1 tablet (20 mg total) by mouth at bedtime. 04/14/21   [place], [name], MD  FARXIGA 10 MG TABS tablet TAKE ONE TABLET BY MOUTH ONCE DAILY 04/12/22   O'Neal, Ronnald Ramp, MD  Fluocinolone Acetonide 0.01 % OIL instill one drop in ears every 2-3 DAYS AS NEEDED FOR pruritis 04/11/22   [place], [name], MD  gabapentin (NEURONTIN) 600 MG tablet TAKE ONE TABLET BY MOUTH EVERY MORNING and TAKE ONE TABLET BY MOUTH EVERYDAY AT BEDTIME 10/07/22   [place], [name], MD  glucose blood (ACCU-CHEK GUIDE) test strip Use to test blood sugar up to 3 times daily  09/15/22   Shamleffer, Konrad Dolores, MD  insulin glargine, 1 Unit Dial, (TOUJEO SOLOSTAR) 300 UNIT/ML Solostar Pen Inject 22 Units into the skin daily. Eat a snack with protein nightly before bedtime. 08/01/22   Shamleffer, Konrad Dolores, MD  insulin lispro (HUMALOG KWIKPEN) 100 UNIT/ML KwikPen Max daily 30 units 08/01/22   Shamleffer, Konrad Dolores, MD  Insulin Pen Needle 32G X 4 MM MISC 1 Device by Does not apply route in the morning, at noon, in the evening, and at bedtime. 08/01/22   Shamleffer, Konrad Dolores, MD  ipratropium (ATROVENT) 0.06 % nasal spray Place 2 sprays into both nostrils 4 (four) times daily. Patient taking differently: Place 2 sprays into both nostrils 4 (four) times daily. As needed 09/14/21   [place], [name], MD  ketoconazole (NIZORAL) 2 % shampoo Apply 1 application. topically 2 (two) times a week. 03/21/22   [place], [name], MD  Lancets Misc. (ACCU-CHEK FASTCLIX LANCET) KIT Use to test  blood sugar up to 3 times daily 09/15/22   Shamleffer, Konrad Dolores, MD  levothyroxine (SYNTHROID) 150 MCG tablet TAKE ONE TABLET BY MOUTH BEFORE BREAKFAST 10/07/22   [place], [name], MD  magnesium oxide (MAG-OX) 400 MG tablet TAKE ONE TABLET BY MOUTH EVERY MORNING 11/11/22   [place], [name], MD  metoprolol succinate (TOPROL-XL) 25 MG 24 hr tablet TAKE ONE TABLET BY MOUTH ONCE DAILY with OR immedaitely following A meal 06/10/22   O'Neal, Ronnald Ramp, MD  Multiple Vitamin (MULTIVITAMIN) tablet Take 1 tablet by mouth daily. K FREE DAILY once daily (MVI with no vitamin K)    [provider]  nitroGLYCERIN (NITROSTAT) 0.4 MG SL tablet Dissolve 1 tab under tongue as needed for chest pain. May repeat every 5 minutes x 2 doses. If no relief call 9-1-1. 04/12/22   O'Neal, Ronnald Ramp, MD  pantoprazole (PROTONIX) 20 MG tablet TAKE ONE TABLET BY MOUTH ONCE DAILY 12/07/22   [place], [name], MD  Polyethylene Glycol 3350 (MIRALAX PO) Take 1 Package by mouth daily as needed (constipation).     [provider]  potassium chloride SA (KLOR-CON M) 20 MEQ tablet TAKE ONE TABLET BY MOUTH ONCE DAILY 12/07/22   O'Neal, Ronnald Ramp, MD  Propylene Glycol (SYSTANE BALANCE OP) Place 1 drop into both eyes daily as needed (for dry eyes).    [provider]  SALINE MIST SPRAY NA Place 2 sprays into the nose at bedtime.    [provider]  Tafamidis 61 MG CAPS Take 1 capsule (61 mg total) by mouth daily. 12/06/22   O'NealRonnald Ramp, MD  tirzepatide [facility]) 2.5 MG/0.5ML Pen Inject 2.5 mg into the skin once a week. 01/31/23   Shamleffer, Konrad Dolores, MD  torsemide (DEMADEX) 20 MG tablet Take 2 tablets (40 mg total) by mouth daily. 08/29/22   Sande Rives, MD  torsemide (DEMADEX) 20 MG tablet TAKE AS DIRECTED 02/03/23   Kathleene Hazel, MD  trolamine salicylate (ASPERCREME) 10 % cream Apply 1 application. topically as needed for muscle pain.    [provider]  warfarin (COUMADIN) 5 MG tablet TAKE 1/2 TABLET BY MOUTH daily OR as directed by coumadin clinic 01/09/23   O'Neal, Ronnald Ramp, MD                                                                                                                                    Allergies Ace inhibitors, Amlodipine, Atenolol, Avandia [rosiglitazone], Darvon [propoxyphene], Erythromycin, Hydralazine, Hydrocodone, Levofloxacin, Morphine and related, Percocet [oxycodone-acetaminophen], Spironolactone, and Tramadol  Review of Systems Review of Systems  Respiratory:  Positive for shortness of breath.    As noted in HPI  Physical Exam Vital Signs  I have reviewed the triage vital signs BP 134/75   Pulse 60   Temp 97.9 F (36.6 C)   Resp 16   Ht  (1.575 m)   Wt 104.3 kg  SpO2 100%   BMI 42.07 kg/m   Physical Exam Vitals reviewed.  Constitutional:      General: She is not in acute distress.    Appearance: She is well-developed. She is not diaphoretic.  HENT:     Head:  Normocephalic and atraumatic.     Nose: Nose normal.  Eyes:     General: No scleral icterus.       Right eye: No discharge.        Left eye: No discharge.     Conjunctiva/sclera: Conjunctivae normal.     Pupils: Pupils are equal, round, and reactive to light.  Cardiovascular:     Rate and Rhythm: Normal rate. Rhythm irregularly irregular.     Heart sounds: No murmur heard.    No friction rub. No gallop.  Pulmonary:     Effort: Pulmonary effort is normal. No respiratory distress.     Breath sounds: Normal breath sounds. No stridor. No rales.  Abdominal:     General: There is no distension.     Palpations: Abdomen is soft.     Tenderness: There is no abdominal tenderness.  Musculoskeletal:        General: No tenderness.     Cervical back: Normal range of motion and neck supple.     Right lower leg: 1+ Pitting Edema present.     Left lower leg: 1+ Pitting Edema present.  Skin:    General: Skin is warm and dry.     Findings: No erythema or rash.  Neurological:     Mental Status: She is alert and oriented to person, place, and time.     ED Results and Treatments Labs (all labs ordered are listed, but only abnormal results are displayed) Labs Reviewed  CBC WITH DIFFERENTIAL/PLATELET - Abnormal; Notable for the following components:      Result Value   RDW 16.1 (*)    All other components within normal limits  BASIC METABOLIC PANEL - Abnormal; Notable for the following components:   Glucose, Bld 130 (*)    BUN 36 (*)    Creatinine, Ser 1.96 (*)    GFR, Estimated 26 (*)    All other components within normal limits  BRAIN NATRIURETIC PEPTIDE - Abnormal; Notable for the following components:   B Natriuretic Peptide 750.8 (*)    All other components within normal limits  PROTIME-INR - Abnormal; Notable for the following components:   Prothrombin Time 26.3 (*)    INR 2.4 (*)    All other components within normal limits                                                                                                                          EKG   Radiology DG Chest 2 View  Result Date: 02/05/2023 CLINICAL DATA:  Shortness of breath, fluid retention EXAM: CHEST - 2 VIEW COMPARISON:  12/24/2021 FINDINGS: Cardiomegaly, vascular congestion. Mediastinal contours within normal limits. Aortic atherosclerosis. Interstitial prominence throughout the  lungs, likely interstitial edema. No effusions or acute bony abnormality. IMPRESSION: Cardiomegaly, mild interstitial edema. Electronically Signed   By: Charlett Nose M.D.   On: 02/05/2023 19:05    Medications Ordered in ED Medications  furosemide (LASIX) injection 60 mg (60 mg Intravenous Given 02/05/23 2123)                                                                                                                                     Procedures Procedures  (including critical care time)  Medical Decision Making / ED Course  Click here for ABCD2, HEART and other calculators  Medical Decision Making Amount and/or Complexity of Data Reviewed Labs: ordered. Decision-making details documented in ED Course. Radiology: ordered and independent interpretation performed. Decision-making details documented in ED Course. ECG/medicine tests: ordered and independent interpretation performed. Decision-making details documented in ED Course.  Risk Prescription drug management.    Patient presents with dyspnea on exertion Known history of CHF Evidence of mild volume overload on exam Patient also in A-fib with rate control.  EKG confirmed A-fib with a rate control. X-ray with evidence of mild pulmonary edema.  Patient is not hypoxic on room air and no respiratory distress. CBC without leukocytosis or anemia Metabolic panel without significant electrolyte derangement.  Patient has mild hyperglycemia without DKA.  Baseline renal  insufficiency without AKI. BNP greater than 750 -up from her priors.   Discussed option for  admission versus diuresing in the emergency department and reassessment.  If patient feels better after reassessment, I feel she should be appropriate for discharge home with increased dose of her torsemide from 40 mg daily to 60 mg for the next 3 days, with close follow-up with cardiology.  If patient fails, she will be admitted for continued IV diuresing.  Patient care turned over to oncoming provider. Patient case and results discussed in detail; please see their note for further ED managment.         Final Clinical Impression(s) / ED Diagnoses Final diagnoses:  Acute on chronic systolic congestive heart failure           This chart was dictated using voice recognition software.  Despite best efforts to proofread,  errors can occur which can change the documentation meaning.    Nira Conn, MD 02/06/23 [facility]

## 2023-02-06 NOTE — Telephone Encounter (Signed)
Pt c/o medication issue:  1. Name of Medication: torsemide (DEMADEX) 20 MG tablet   2. How are you currently taking this medication (dosage and times per day)? N/A  3. Are you having a reaction (difficulty breathing--STAT)? N/A  4. What is your medication issue? Needs new prescription sent with instructions on how the patient is to take the medication.

## 2023-02-06 NOTE — Telephone Encounter (Signed)
Pt called clinic, reported missed her warfarin dose last night. Advised her to take 1/2 tab now, then resume normal 1/2 tab daily dosing again this evening.

## 2023-02-06 NOTE — Discharge Instructions (Signed)
Please take 60 mg of your torsemide for the next 4 days then go back to 40 daily. Please follow up with your PCP/cardiologist in 4-5 days to recheck your symptoms.

## 2023-02-07 ENCOUNTER — Ambulatory Visit: Payer: Medicare Other | Admitting: Physical Therapy

## 2023-02-08 ENCOUNTER — Encounter: Payer: Self-pay | Admitting: Cardiovascular Disease

## 2023-02-08 NOTE — Progress Notes (Deleted)
HPI:  [name] is a 82 y.o. female, who is here today to follow on recent ED visit.  Review of Systems See other pertinent positives and negatives in HPI.  Current Outpatient Medications on File Prior to Visit  Medication Sig Dispense Refill   acetaminophen (TYLENOL) 500 MG tablet Take 1,000 mg by mouth every 6 (six) hours as needed for mild pain.     Alcohol Swabs (B-D SINGLE USE SWABS REGULAR) PADS Use to test blood sugar up to 3 times daily 100 each 3   allopurinol (ZYLOPRIM) 100 MG tablet TAKE ONE TABLET BY MOUTH EVERY MORNING 90 tablet 1   aspirin EC 81 MG EC tablet Take 1 tablet (81 mg total) by mouth daily. Swallow whole. 30 tablet 11   atorvastatin (LIPITOR) 20 MG tablet TAKE ONE TABLET BY MOUTH EVERYDAY AT BEDTIME 90 tablet 2   benzonatate (TESSALON) 200 MG capsule Take 1 capsule (200 mg total) by mouth every 6 (six) hours as needed for cough. 40 capsule 0   Blood Glucose Monitoring Suppl (ACCU-CHEK GUIDE) w/Device KIT 1 Device by Does not apply route 3 (three) times daily. 1 kit 0   Cholecalciferol (VITAMIN D3) 50 MCG (2000 UT) capsule Take 2,000 Units by mouth daily.     Continuous Blood Gluc Receiver (FREESTYLE LIBRE 2 READER) DEVI Use as instructed to check blood sugar. 1 each 0   Continuous Blood Gluc Sensor (FREESTYLE LIBRE 2 SENSOR) MISC Change  sensors every 14 days 6 each 3   Eplontersen Sodium (WAINUA) 45 MG/0.8ML SOAJ Inject 45 mg into the skin every 30 (thirty) days. 0.8 mL 12   famotidine (PEPCID) 20 MG tablet Take 1 tablet (20 mg total) by mouth at bedtime. 90 tablet 1   FARXIGA 10 MG TABS tablet TAKE ONE TABLET BY MOUTH ONCE DAILY 90 tablet 3   Fluocinolone Acetonide 0.01 % OIL instill one drop in ears every 2-3 DAYS AS NEEDED FOR pruritis 20 mL 1   gabapentin (NEURONTIN) 600 MG tablet TAKE ONE TABLET BY MOUTH EVERY MORNING and TAKE ONE TABLET BY MOUTH EVERYDAY AT BEDTIME 60 tablet 3   glucose blood (ACCU-CHEK GUIDE) test strip Use to test blood sugar  up to 3 times daily 100 each 3   insulin glargine, 1 Unit Dial, (TOUJEO SOLOSTAR) 300 UNIT/ML Solostar Pen Inject 22 Units into the skin daily. Eat a snack with protein nightly before bedtime. 15 mL 6   insulin lispro (HUMALOG KWIKPEN) 100 UNIT/ML KwikPen Max daily 30 units 30 mL 3   Insulin Pen Needle 32G X 4 MM MISC 1 Device by Does not apply route in the morning, at noon, in the evening, and at bedtime. 400 each 3   ipratropium (ATROVENT) 0.06 % nasal spray Place 2 sprays into both nostrils 4 (four) times daily. (Patient taking differently: Place 2 sprays into both nostrils 4 (four) times daily. As needed) 15 mL 3   ketoconazole (NIZORAL) 2 % shampoo Apply 1 application. topically 2 (two) times a week. 120 mL 2   Lancets Misc. (ACCU-CHEK FASTCLIX LANCET) KIT Use to test blood sugar up to 3 times daily 1 kit 1   levothyroxine (SYNTHROID) 150 MCG tablet TAKE ONE TABLET BY MOUTH BEFORE BREAKFAST 90 tablet 3   magnesium oxide (MAG-OX) 400 MG tablet TAKE ONE TABLET BY MOUTH EVERY MORNING 90 tablet 1   metoprolol succinate (TOPROL-XL) 25 MG 24 hr tablet TAKE ONE TABLET BY MOUTH ONCE DAILY with OR immedaitely following A meal 90  tablet 3   Multiple Vitamin (MULTIVITAMIN) tablet Take 1 tablet by mouth daily. K FREE DAILY once daily (MVI with no vitamin K)     nitroGLYCERIN (NITROSTAT) 0.4 MG SL tablet Dissolve 1 tab under tongue as needed for chest pain. May repeat every 5 minutes x 2 doses. If no relief call 9-1-1. 25 tablet 2   pantoprazole (PROTONIX) 20 MG tablet TAKE ONE TABLET BY MOUTH ONCE DAILY 30 tablet 2   Polyethylene Glycol 3350 (MIRALAX PO) Take 1 Package by mouth daily as needed (constipation).     potassium chloride SA (KLOR-CON M) 20 MEQ tablet TAKE ONE TABLET BY MOUTH ONCE DAILY 90 tablet 1   Propylene Glycol (SYSTANE BALANCE OP) Place 1 drop into both eyes daily as needed (for dry eyes).     SALINE MIST SPRAY NA Place 2 sprays into the nose at bedtime.     Tafamidis 61 MG CAPS Take 1  capsule (61 mg total) by mouth daily. 30 capsule 12   tirzepatide (MOUNJARO) 2.5 MG/0.5ML Pen Inject 2.5 mg into the skin once a week. 6 mL 3   torsemide (DEMADEX) 20 MG tablet Take 1 tablet (20 mg total) by mouth 2 (two) times daily. 180 tablet 3   trolamine salicylate (ASPERCREME) 10 % cream Apply 1 application. topically as needed for muscle pain.     warfarin (COUMADIN) 5 MG tablet TAKE 1/2 TABLET BY MOUTH daily OR as directed by coumadin clinic 50 tablet 1   No current facility-administered medications on file prior to visit.    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 01/2015 Cardiolite: + inf wall ischemia, EF 56%;  b. 01/2015 Cath: LM nl, LAD 47m, LCX min irregs, RCA dominant, 50-46m.   Sleep apnea    Swallowing difficulty    Allergies  Allergen Reactions   Ace Inhibitors Other (See Comments)    unknown   Amlodipine Other (See Comments)    unknown   Atenolol Other (See Comments)    bradycardia   Avandia [Rosiglitazone] Other (See Comments)    unknown   Darvon [Propoxyphene] Other (See Comments)    unknown   Erythromycin Itching   Hydralazine Other (See Comments)    Burning in throat and chest   Hydrocodone Other (See Comments)    Hallucinations.   Levofloxacin Itching   Morphine And Related Other (See Comments)    Dizzy and hallucianation, vomiting; Willing to try low dose   Percocet [Oxycodone-Acetaminophen] Other (See Comments)    hallucination   Spironolactone Other (See Comments)    unknown   Tramadol Other (See Comments)    Unknown/does not recall reaction but does not want to take again    Social  History   Socioeconomic History   Marital status: Widowed    Spouse name: Not on file   Number of children: Not on file   Years of education: Not on file   Highest education level: Not on file  Occupational History   Occupation: Retired  Tobacco Use   Smoking status: Never   Smokeless  tobacco: Never  Vaping Use   Vaping Use: Never used  Substance and Sexual Activity   Alcohol use: No   Drug use: Never   Sexual activity: Not Currently  Other Topics Concern   Not on file  Social History Narrative   Lives with daughter   Social Determinants of Health   Financial Resource Strain: Low Risk  (11/30/2022)   Overall Financial Resource Strain (CARDIA)    Difficulty of Paying Living Expenses: Not very hard  Food Insecurity: No Food Insecurity (11/30/2022)   Hunger Vital Sign    Worried About Running Out of Food in the Last Year: Never true    Ran Out of Food in the Last Year: Never true  Transportation Needs: No Transportation Needs (11/30/2022)   PRAPARE - Administrator, Civil Service (Medical): No    Lack of Transportation (Non-Medical): No  Physical Activity: Inactive (02/16/2022)   Exercise Vital Sign    Days of Exercise per Week: 0 days    Minutes of Exercise per Session: 0 min  Stress: No Stress Concern Present (02/16/2022)   [employer] of Occupational Health - Occupational Stress Questionnaire    Feeling of Stress : Not at all  Social Connections: Moderately Integrated (02/16/2022)   Social Connection and Isolation Panel [NHANES]    Frequency of Communication with Friends and Family: More than three times a week    Frequency of Social Gatherings with Friends and Family: More than three times a week    Attends Religious Services: More than 4 times per year    Active Member of [employer] or Organizations: Yes    Attends Banker Meetings: More than 4 times per year    Marital Status: Widowed    There were no vitals filed for this visit. There is no  height or weight on file to calculate BMI.  Physical Exam  ASSESSMENT AND PLAN:  There are no diagnoses linked to this encounter.  No orders of the defined types were placed in this encounter.   No problem-specific Assessment & Plan notes found for this encounter.   No follow-ups on file.  [name]. [place], MD  [facility]. [place] office.

## 2023-02-08 NOTE — Progress Notes (Signed)
Cardiology Office Note:    Date:  02/09/2023   ID:  Regina [name], DOB [birth year], MRN [id number]  PCP:  [place], [name], MD   [facility] Providers Cardiologist:  Reatha Harps, MD { Referring MD: [place], [name], MD   Chief Complaint  Patient presents with   Follow-up    CHF    History of Present Illness:    Regina [name] is a 82 y.o. female with a hx of persistent A-fib on Coumadin, ATTR cardiac amyloid with positive PYP, chronic systolic and diastolic heart failure with a EF as low as 20% in the setting of A-fib with RVR, 30% on CMR, improved to 45 to 50% 04/2021, DM, HLD, HTN, CAD and obstructive by heart cath in 2016, stress test 2021 nonischemic, OSA, CKD 3-4, CVA 11/2020 at [facility], and polymyalgia rheumatica.  She was last seen by Dr. Flora Lipps 01/06/2023 with abdominal edema in the setting of cardiac amyloidosis.  He increased her torsemide to 40 mg in the morning and 20 mg at night.  Echo was repeated 02/03/2023 and showed that her LVEF was reduced back to 40% with normal pressures.  She was continued on her torsemide 40/20 mg and was recommended for physical therapy.  She was also referred to advanced heart failure clinic.  She was seen in the ER 02/06/2023 for shortness of breath.  She was given IV Lasix with good result and discharged without admission.  She recounts SOB x 2 weeks prompting ER evaluation. She received IV lasix with improvement in weight and symptoms. She thinks she is  taking  60 mg torsemide x 4 days with weight 230 lbs. She reports improvement in SOB, she does still have DOE.   Dry weight is 220 lbs Today she weighed 230 lbs  She at first tells me that her weight was near baseline after her ER visit, but then states her weight has been hovering around 230 pounds. She can't recall and didn't bring her weight log. We go over her diet and it doesn't sound like she has had significant indiscretions.   With further discussion, she is unsure of  what torsemide she is taking. She receives pill packs and did not bring these today. Since she is mildly volume up with weight still up, I think she should keep taking what she is taking. I believe she is taking 40 mg torsemide in the morning and 20 mg torsemide at night, but there has been a lot of back-and-forth trying to figure out what she is actually taking at home.   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 01/2015 Cardiolite: + inf wall ischemia, EF 56%;  b. 01/2015 Cath: LM nl, LAD 3m, LCX min irregs, RCA dominant, 50-74m.   Sleep apnea    Swallowing difficulty     Past Surgical History:  Procedure Laterality Date   ABDOMINAL HYSTERECTOMY     BUBBLE STUDY  07/01/2020   Procedure: BUBBLE STUDY;  Surgeon: Parke Poisson, MD;  Location: [facility] ENDOSCOPY;  Service: Cardiovascular;;  BUBBLE STUDY  08/05/2020   Procedure: BUBBLE STUDY;  Surgeon: Sande Rives, MD;  Location: [facility] ENDOSCOPY;  Service: Cardiovascular;;  done with definity    CARDIAC CATHETERIZATION     ESOPHAGOGASTRODUODENOSCOPY     a. 01/2015 EGD: patent esophagus.   ESOPHAGOGASTRODUODENOSCOPY N/A 01/20/2015   Procedure: ESOPHAGOGASTRODUODENOSCOPY (EGD);  Surgeon: Jeani Hawking, MD;  Location: [facility] ENDOSCOPY;  Service: Endoscopy;  Laterality: N/A;   HAND SURGERY     JOINT REPLACEMENT     reports history bilateral TKA and right TSA   LEFT HEART CATHETERIZATION WITH CORONARY ANGIOGRAM N/A 01/19/2015   RIGHT HEART CATH N/A 12/27/2021   Procedure: RIGHT HEART CATH;  Surgeon: Runell Gess, MD;  Location: [facility] INVASIVE CV LAB;  Service: Cardiovascular;   Laterality: N/A;   TEE WITHOUT CARDIOVERSION  05/04/2020   TEE WITHOUT CARDIOVERSION N/A 05/05/2020   Procedure: TRANSESOPHAGEAL ECHOCARDIOGRAM (TEE);  Surgeon: Chrystie Nose, MD;  Location: [facility] ENDOSCOPY;  Service: Cardiovascular;  Laterality: N/A;   TEE WITHOUT CARDIOVERSION N/A 07/01/2020   Procedure: TRANSESOPHAGEAL ECHOCARDIOGRAM (TEE);  Surgeon: Parke Poisson, MD;  Location: [facility] ENDOSCOPY;  Service: Cardiovascular;  Laterality: N/A;   TEE WITHOUT CARDIOVERSION N/A 08/05/2020   Procedure: TRANSESOPHAGEAL ECHOCARDIOGRAM (TEE);  Surgeon: Sande Rives, MD;  Location: [facility] ENDOSCOPY;  Service: Cardiovascular;  Laterality: N/A;   THYROIDECTOMY      Current Medications: Current Meds  Medication Sig   acetaminophen (TYLENOL) 500 MG tablet Take 1,000 mg by mouth every 6 (six) hours as needed for mild pain.   Alcohol Swabs (B-D SINGLE USE SWABS REGULAR) PADS Use to test blood sugar up to 3 times daily   allopurinol (ZYLOPRIM) 100 MG tablet TAKE ONE TABLET BY MOUTH EVERY MORNING   amoxicillin (AMOXIL) 500 MG tablet Take 1,000 mg by mouth 2 (two) times daily. Prior to dental appointments   aspirin EC 81 MG EC tablet Take 1 tablet (81 mg total) by mouth daily. Swallow whole.   atorvastatin (LIPITOR) 20 MG tablet TAKE ONE TABLET BY MOUTH EVERYDAY AT BEDTIME   benzonatate (TESSALON) 200 MG capsule Take 1 capsule (200 mg total) by mouth every 6 (six) hours as needed for cough.   Blood Glucose Monitoring Suppl (ACCU-CHEK GUIDE) w/Device KIT 1 Device by Does not apply route 3 (three) times daily.   Cholecalciferol (VITAMIN D3) 50 MCG (2000 UT) capsule Take 2,000 Units by mouth daily.   Continuous Blood Gluc Receiver (FREESTYLE LIBRE 2 READER) DEVI Use as instructed to check blood sugar.   Continuous Blood Gluc Sensor (FREESTYLE LIBRE 2 SENSOR) MISC Change  sensors every 14 days   Eplontersen Sodium (WAINUA) 45 MG/0.8ML SOAJ Inject 45 mg into the skin every 30 (thirty) days.   famotidine  (PEPCID) 20 MG tablet Take 1 tablet (20 mg total) by mouth at bedtime.   FARXIGA 10 MG TABS tablet TAKE ONE TABLET BY MOUTH ONCE DAILY   Fluocinolone Acetonide 0.01 % OIL instill one drop in ears every 2-3 DAYS AS NEEDED FOR pruritis   gabapentin (NEURONTIN) 600 MG tablet TAKE ONE TABLET BY MOUTH EVERY MORNING and TAKE ONE TABLET BY MOUTH EVERYDAY AT BEDTIME   glucose blood (ACCU-CHEK GUIDE) test strip Use to test blood sugar up to 3 times daily   insulin glargine, 1 Unit Dial, (TOUJEO SOLOSTAR) 300 UNIT/ML Solostar Pen Inject 22 Units into the skin daily. Eat a snack with protein nightly before bedtime.   insulin lispro (HUMALOG KWIKPEN) 100 UNIT/ML KwikPen Max daily 30 units   Insulin  Pen Needle 32G X 4 MM MISC 1 Device by Does not apply route in the morning, at noon, in the evening, and at bedtime.   ipratropium (ATROVENT) 0.06 % nasal spray Place 2 sprays into both nostrils 4 (four) times daily. (Patient taking differently: Place 2 sprays into both nostrils 4 (four) times daily. As needed)   ketoconazole (NIZORAL) 2 % shampoo Apply 1 application. topically 2 (two) times a week.   Lancets Misc. (ACCU-CHEK FASTCLIX LANCET) KIT Use to test blood sugar up to 3 times daily   levothyroxine (SYNTHROID) 150 MCG tablet TAKE ONE TABLET BY MOUTH BEFORE BREAKFAST   magnesium oxide (MAG-OX) 400 MG tablet TAKE ONE TABLET BY MOUTH EVERY MORNING   metoprolol succinate (TOPROL-XL) 25 MG 24 hr tablet TAKE ONE TABLET BY MOUTH ONCE DAILY with OR immedaitely following A meal   Multiple Vitamin (MULTIVITAMIN) tablet Take 1 tablet by mouth daily. K FREE DAILY once daily (MVI with no vitamin K)   nitroGLYCERIN (NITROSTAT) 0.4 MG SL tablet Dissolve 1 tab under tongue as needed for chest pain. May repeat every 5 minutes x 2 doses. If no relief call 9-1-1.   pantoprazole (PROTONIX) 20 MG tablet TAKE ONE TABLET BY MOUTH ONCE DAILY   Polyethylene Glycol 3350 (MIRALAX PO) Take 1 Package by mouth daily as needed  (constipation).   potassium chloride SA (KLOR-CON M) 20 MEQ tablet TAKE ONE TABLET BY MOUTH ONCE DAILY   Propylene Glycol (SYSTANE BALANCE OP) Place 1 drop into both eyes daily as needed (for dry eyes).   SALINE MIST SPRAY NA Place 2 sprays into the nose at bedtime.   Tafamidis 61 MG CAPS Take 1 capsule (61 mg total) by mouth daily.   tirzepatide [facility]) 2.5 MG/0.5ML Pen Inject 2.5 mg into the skin once a week.   torsemide (DEMADEX) 20 MG tablet Take 1 tablet (20 mg total) by mouth 2 (two) times daily.   trolamine salicylate (ASPERCREME) 10 % cream Apply 1 application. topically as needed for muscle pain.   warfarin (COUMADIN) 5 MG tablet TAKE 1/2 TABLET BY MOUTH daily OR as directed by coumadin clinic   [DISCONTINUED] torsemide (DEMADEX) 20 MG tablet Take 1 tablet by mouth at lunch and 1 tablet by mouth in the evening FOR 4 DAYS THEN GO BACK TO 1 TABLET BY MOUTH DAILY (IN YOUR PILL PACK)   [DISCONTINUED] torsemide (DEMADEX) 20 MG tablet TAKE 1 TABLET BY MOUTH EVERY EVENING     Allergies:   Ace inhibitors, Amlodipine, Atenolol, Avandia [rosiglitazone], Darvon [propoxyphene], Erythromycin, Hydralazine, Hydrocodone, Levofloxacin, Morphine and related, Percocet [oxycodone-acetaminophen], Spironolactone, and Tramadol   Social History   Socioeconomic History   Marital status: Widowed    Spouse name: Not on file   Number of children: Not on file   Years of education: Not on file   Highest education level: Not on file  Occupational History   Occupation: Retired  Tobacco Use   Smoking status: Never   Smokeless tobacco: Never  Vaping Use   Vaping Use: Never used  Substance and Sexual Activity   Alcohol use: No   Drug use: Never   Sexual activity: Not Currently  Other Topics Concern   Not on file  Social History Narrative   Lives with daughter   Social Determinants of Health   Financial Resource Strain: Low Risk  (11/30/2022)   Overall Financial Resource Strain (CARDIA)     Difficulty of Paying Living Expenses: Not very hard  Food Insecurity: No Food Insecurity (11/30/2022)  Hunger Vital Sign    Worried About Running Out of Food in the Last Year: Never true    Ran Out of Food in the Last Year: Never true  Transportation Needs: No Transportation Needs (11/30/2022)   PRAPARE - Administrator, Civil Service (Medical): No    Lack of Transportation (Non-Medical): No  Physical Activity: Inactive (02/16/2022)   Exercise Vital Sign    Days of Exercise per Week: 0 days    Minutes of Exercise per Session: 0 min  Stress: No Stress Concern Present (02/16/2022)   [employer] of Occupational Health - Occupational Stress Questionnaire    Feeling of Stress : Not at all  Social Connections: Moderately Integrated (02/16/2022)   Social Connection and Isolation Panel [NHANES]    Frequency of Communication with Friends and Family: More than three times a week    Frequency of Social Gatherings with Friends and Family: More than three times a week    Attends Religious Services: More than 4 times per year    Active Member of [employer] or Organizations: Yes    Attends Banker Meetings: More than 4 times per year    Marital Status: Widowed     Family History: The patient's family history includes Alcoholism in her father; Cancer in her father; Heart disease in her mother; Hypertension in her mother.  ROS:   Please see the history of present illness.     All other systems reviewed and are negative.  EKGs/Labs/Other Studies Reviewed:    The following studies were reviewed today:  Echo 02/03/23:  1. Left ventricular ejection fraction, by estimation, is 35 to 40%. Left  ventricular ejection fraction by 3D volume is 38 %. The left ventricle has  moderately decreased function. The left ventricle demonstrates global  hypokinesis. There is mild left  ventricular hypertrophy. Left ventricular diastolic parameters are  indeterminate. The average left  ventricular global longitudinal strain is  -15.2 %. The global longitudinal strain is abnormal.   2. Right ventricular systolic function is normal. The right ventricular  size is normal.   3. Left atrial size was mildly dilated.   4. The mitral valve is normal in structure. Mild mitral valve  regurgitation. No evidence of mitral stenosis.   5. The aortic valve is tricuspid. Aortic valve regurgitation is not  visualized. No aortic stenosis is present.   6. The inferior vena cava is normal in size with greater than 50%  respiratory variability, suggesting right atrial pressure of 3 mmHg.    EKG:  EKG is not ordered today.   Recent Labs: 02/05/2023: B Natriuretic Peptide 750.8; BUN 36; Creatinine, Ser 1.96; Hemoglobin 12.8; Platelets 258; Potassium 3.7; Sodium 139  Recent Lipid Panel    Component Value Date/Time   CHOL 115 08/13/2021 1501   CHOL 117 03/24/2020 1428   TRIG 101.0 08/13/2021 1501   HDL 54.80 08/13/2021 1501   HDL 56 03/24/2020 1428   CHOLHDL 2 08/13/2021 1501   VLDL 20.2 08/13/2021 1501   LDLCALC 40 08/13/2021 1501   LDLCALC 49 03/24/2020 1428     Risk Assessment/Calculations:    CHA2DS2-VASc Score = 7   This indicates a 11.2% annual risk of stroke. The patient's score is based upon: CHF History: 1 HTN History: 1 Diabetes History: 1 Stroke History: 0 Vascular Disease History: 1 Age Score: 2 Gender Score: 1       Physical Exam:    VS:  BP (!) 142/80   Pulse 91  Ht 5\' 2"  (1.575 m)   Wt 227 lb 9.6 oz (103.2 kg)   SpO2 97%   BMI 41.63 kg/m     Wt Readings from Last 3 Encounters:  02/09/23 227 lb 9.6 oz (103.2 kg)  02/05/23 230 lb (104.3 kg)  01/31/23 232 lb (105.2 kg)     GEN:  Well nourished, well developed in no acute distress HEENT: Normal NECK: + JVD; No carotid bruits LYMPHATICS: No lymphadenopathy CARDIAC: irregular rhythm, regular rate, no murmurs, rubs, gallops RESPIRATORY:  Clear to auscultation without rales, wheezing or rhonchi   ABDOMEN: Soft, non-tender, non-distended MUSCULOSKELETAL:  mild B LE edema; No deformity  SKIN: Warm and dry NEUROLOGIC:  Alert and oriented x 3 PSYCHIATRIC:  Normal affect   ASSESSMENT:    1. Medication management   2. Chronic systolic congestive heart failure   3. Hereditary cardiac amyloidosis (HCC)   4. Coronary artery disease involving native coronary artery of native heart without angina pectoris   5. Persistent atrial fibrillation (HCC)   6. Chronic anticoagulation    PLAN:    In order of problems listed above:  Hereditary cardiac amyloidosis Chronic systolic congestive heart failure EF initially 20% but now improved to 50%. On Eplontersen, metoprolol succinate 25 mg daily No ACE/ARB/Arni/MRA given CKD 4 On farxiga At baseline she takes 40 mg torsemide daily with PRN extra 20 mg torsemide -but again, this is unclear Needs AHF clinic appt ASAP-we have messaged their office to help set up an appointment as early as possible next week.  On exam she is warm, mildly volume up, fortunately lungs sound clear.  Addendum: After several back-and-forth's and calls to her grandson, I believe that she was confused by the different strengths of her torsemide prescriptions-she has 40 mg of torsemide in her pill pack in the morning (20 mg tablets) and then was taking an additional 40 mg in the afternoon and 40 mg in the evening to total 120 mg of torsemide daily.  Given this, I need to check her kidney function stat tomorrow morning, unfortunately lab is finished for today.  I have asked her to take no further extra torsemide until we can sort out her renal function and her actual dose of torsemide at home.   Nonobstructive CAD Prior heart catheterization, history of nonischemic nuclear stress test Continue statin   Persistent atrial fibrillation On BB, no syncope   Chronic Coumadin therapy INR 2.4  02/05/23 Managed here    Needs to see AHF next week.    Medication  Adjustments/Labs and Tests Ordered: Current medicines are reviewed at length with the patient today.  Concerns regarding medicines are outlined above.  Orders Placed This Encounter  Procedures   Basic metabolic panel   Meds ordered this encounter  Medications   DISCONTD: torsemide (DEMADEX) 20 MG tablet    Sig: Take 1 tablet by mouth at lunch and 1 tablet by mouth in the evening FOR 4 DAYS THEN GO BACK TO 1 TABLET BY MOUTH DAILY (IN YOUR PILL PACK)    Dispense:  60 tablet    Refill:  0    THIS IS AN ADDITION TO 20 MG SHE TAKES IN THE MORNING ALREADY IN HER PILL PACK   DISCONTD: torsemide (DEMADEX) 20 MG tablet    Sig: TAKE 1 TABLET BY MOUTH EVERY EVENING    Dispense:  30 tablet    Refill:  0    THIS IS IN ADDITION TO THE 2 TABLETS IN THE A.M. SHE GETS FROM  [facility]    Patient Instructions  Medication Instructions:  Your physician has recommended you make the following change in your medication:   CONTINUE 2 20 MG OF TORSEMIDE IN THE MORNING OUT OF YOUR PILL PACK.  YOU WILL TAKE 1 TORSEMIDE 20 MG IN THE EVENING FOR 4 DAYS THEN WILL RETURN TO ONLY 2 TABLETS OUT OF YOUR PILL PACK   *If you need a refill on your cardiac medications before your next appointment, please call your pharmacy*   Lab Work: PT WILL COME BACK TOMORROW 02/10/23 FOR A STAT BMET  If you have labs (blood work) drawn today and your tests are completely normal, you will receive your results only by: MyChart Message (if you have MyChart) OR A paper copy in the mail If you have any lab test that is abnormal or we need to change your treatment, we will call you to review the results.   Testing/Procedures: None ordered   Follow-Up: At [facility], you and your health needs are our priority.  As part of our continuing mission to provide you with exceptional heart care, we have created designated Provider Care Teams.  These Care Teams include your primary Cardiologist (physician) and Advanced Practice  Providers (APPs -  Physician Assistants and Nurse Practitioners) who all work together to provide you with the care you need, when you need it.  We recommend signing up for the patient portal called "MyChart".  Sign up information is provided on this After Visit Summary.  MyChart is used to connect with patients for Virtual Visits (Telemedicine).  Patients are able to view lab/test results, encounter notes, upcoming appointments, etc.  Non-urgent messages can be sent to your provider as well.   To learn more about what you can do with MyChart, go to [email or web address].    Your next appointment:   Next week  Provider:   [facility], they will reach out to you to schedule     Other Instructions SEE YOUR PRIMARY CARE PHYSICIAN FOR INCOMPLETE BLADDER EMPTYING   Signed, Marcelino Duster, [place]  02/09/2023 5:40 PM    [facility]

## 2023-02-09 ENCOUNTER — Ambulatory Visit: Payer: Medicare Other | Admitting: Physical Therapy

## 2023-02-09 ENCOUNTER — Telehealth: Payer: Self-pay

## 2023-02-09 ENCOUNTER — Other Ambulatory Visit: Payer: Self-pay | Admitting: *Deleted

## 2023-02-09 ENCOUNTER — Encounter: Payer: Self-pay | Admitting: Physician Assistant

## 2023-02-09 ENCOUNTER — Ambulatory Visit: Payer: Medicare Other | Attending: Cardiovascular Disease | Admitting: Physician Assistant

## 2023-02-09 VITALS — BP 142/80 | HR 91 | Ht 62.0 in | Wt 227.6 lb

## 2023-02-09 DIAGNOSIS — I5022 Chronic systolic (congestive) heart failure: Secondary | ICD-10-CM | POA: Diagnosis not present

## 2023-02-09 DIAGNOSIS — Z7901 Long term (current) use of anticoagulants: Secondary | ICD-10-CM

## 2023-02-09 DIAGNOSIS — I2510 Atherosclerotic heart disease of native coronary artery without angina pectoris: Secondary | ICD-10-CM | POA: Diagnosis not present

## 2023-02-09 DIAGNOSIS — I4819 Other persistent atrial fibrillation: Secondary | ICD-10-CM | POA: Diagnosis not present

## 2023-02-09 DIAGNOSIS — E854 Organ-limited amyloidosis: Secondary | ICD-10-CM | POA: Diagnosis not present

## 2023-02-09 DIAGNOSIS — Z79899 Other long term (current) drug therapy: Secondary | ICD-10-CM

## 2023-02-09 DIAGNOSIS — I43 Cardiomyopathy in diseases classified elsewhere: Secondary | ICD-10-CM

## 2023-02-09 MED ORDER — TORSEMIDE 20 MG PO TABS: ORAL_TABLET | ORAL | 0 refills | Status: DC

## 2023-02-09 NOTE — Telephone Encounter (Signed)
        Patient  visited [facility] [place] on 02/06/2023  for shortness of breath.   Telephone encounter attempt :  1st  A HIPAA compliant voice message was left requesting a return call.  Instructed patient to call back at No answer unable to leave message.   Jermaine Neuharth [facility]  [facility] Population Health Community Resource Care Guide   [email or web address]  ?? [phone]   Website: [email or web address]

## 2023-02-09 NOTE — Patient Instructions (Addendum)
Medication Instructions:  Your physician has recommended you make the following change in your medication:   CONTINUE 2 20 MG OF TORSEMIDE IN THE MORNING OUT OF YOUR PILL PACK.  YOU WILL TAKE 1 TORSEMIDE 20 MG IN THE EVENING FOR 4 DAYS THEN WILL RETURN TO ONLY 2 TABLETS OUT OF YOUR PILL PACK   *If you need a refill on your cardiac medications before your next appointment, please call your pharmacy*   Lab Work: PT WILL COME BACK TOMORROW 02/10/23 FOR A STAT BMET  If you have labs (blood work) drawn today and your tests are completely normal, you will receive your results only by: MyChart Message (if you have MyChart) OR A paper copy in the mail If you have any lab test that is abnormal or we need to change your treatment, we will call you to review the results.   Testing/Procedures: None ordered   Follow-Up: At [facility], you and your health needs are our priority.  As part of our continuing mission to provide you with exceptional heart care, we have created designated Provider Care Teams.  These Care Teams include your primary Cardiologist (physician) and Advanced Practice Providers (APPs -  Physician Assistants and Nurse Practitioners) who all work together to provide you with the care you need, when you need it.  We recommend signing up for the patient portal called "MyChart".  Sign up information is provided on this After Visit Summary.  MyChart is used to connect with patients for Virtual Visits (Telemedicine).  Patients are able to view lab/test results, encounter notes, upcoming appointments, etc.  Non-urgent messages can be sent to your provider as well.   To learn more about what you can do with MyChart, go to [email or web address].    Your next appointment:   Next week  Provider:   [facility], they will reach out to you to schedule     Other Instructions SEE YOUR PRIMARY CARE PHYSICIAN FOR INCOMPLETE BLADDER EMPTYING

## 2023-02-10 ENCOUNTER — Encounter: Payer: Self-pay | Admitting: Family Medicine

## 2023-02-10 ENCOUNTER — Ambulatory Visit (INDEPENDENT_AMBULATORY_CARE_PROVIDER_SITE_OTHER): Payer: Medicare Other | Admitting: Family Medicine

## 2023-02-10 ENCOUNTER — Telehealth: Payer: Self-pay

## 2023-02-10 ENCOUNTER — Ambulatory Visit: Payer: Medicare Other | Admitting: Family Medicine

## 2023-02-10 ENCOUNTER — Telehealth: Payer: Self-pay | Admitting: *Deleted

## 2023-02-10 VITALS — BP 124/80 | HR 95 | Temp 98.5°F | Resp 16 | Ht 62.0 in | Wt 227.0 lb

## 2023-02-10 DIAGNOSIS — I4891 Unspecified atrial fibrillation: Secondary | ICD-10-CM | POA: Diagnosis not present

## 2023-02-10 DIAGNOSIS — D6869 Other thrombophilia: Secondary | ICD-10-CM | POA: Diagnosis not present

## 2023-02-10 DIAGNOSIS — E8582 Wild-type transthyretin-related (ATTR) amyloidosis: Secondary | ICD-10-CM

## 2023-02-10 DIAGNOSIS — Z79899 Other long term (current) drug therapy: Secondary | ICD-10-CM | POA: Diagnosis not present

## 2023-02-10 DIAGNOSIS — I5022 Chronic systolic (congestive) heart failure: Secondary | ICD-10-CM | POA: Diagnosis not present

## 2023-02-10 DIAGNOSIS — R079 Chest pain, unspecified: Secondary | ICD-10-CM | POA: Diagnosis not present

## 2023-02-10 LAB — BASIC METABOLIC PANEL
BUN/Creatinine Ratio: 20 (ref 12–28)
BUN: 40 mg/dL — ABNORMAL HIGH (ref 8–27)
CO2: 31 mmol/L — ABNORMAL HIGH (ref 20–29)
Calcium: 9.7 mg/dL (ref 8.7–10.3)
Chloride: 97 mmol/L (ref 96–106)
Creatinine, Ser: 2.01 mg/dL — ABNORMAL HIGH (ref 0.57–1.00)
Glucose: 140 mg/dL — ABNORMAL HIGH (ref 70–99)
Potassium: 4.2 mmol/L (ref 3.5–5.2)
Sodium: 139 mmol/L (ref 134–144)
eGFR: 25 mL/min/{1.73_m2} — ABNORMAL LOW (ref >59)

## 2023-02-10 MED ORDER — TORSEMIDE 20 MG PO TABS: 40.0000 mg | ORAL_TABLET | Freq: Every morning | ORAL | 3 refills | Status: DC

## 2023-02-10 NOTE — Progress Notes (Unsigned)
HPI: Regina [name] is a 82 y.o. female, who is here today to follow on recent ED visit. Evaluated in the ED on 02/05/2023,****. Last appointment with her cardiologist was yesterday.  Review of Systems See other pertinent positives and negatives in HPI.  Current Outpatient Medications on File Prior to Visit  Medication Sig Dispense Refill   acetaminophen (TYLENOL) 500 MG tablet Take 1,000 mg by mouth every 6 (six) hours as needed for mild pain.     Alcohol Swabs (B-D SINGLE USE SWABS REGULAR) PADS Use to test blood sugar up to 3 times daily 100 each 3   allopurinol (ZYLOPRIM) 100 MG tablet TAKE ONE TABLET BY MOUTH EVERY MORNING 90 tablet 1   amoxicillin (AMOXIL) 500 MG tablet Take 1,000 mg by mouth 2 (two) times daily. Prior to dental appointments     aspirin EC 81 MG EC tablet Take 1 tablet (81 mg total) by mouth daily. Swallow whole. 30 tablet 11   atorvastatin (LIPITOR) 20 MG tablet TAKE ONE TABLET BY MOUTH EVERYDAY AT BEDTIME 90 tablet 2   benzonatate (TESSALON) 200 MG capsule Take 1 capsule (200 mg total) by mouth every 6 (six) hours as needed for cough. 40 capsule 0   Blood Glucose Monitoring Suppl (ACCU-CHEK GUIDE) w/Device KIT 1 Device by Does not apply route 3 (three) times daily. 1 kit 0   Cholecalciferol (VITAMIN D3) 50 MCG (2000 UT) capsule Take 2,000 Units by mouth daily.     Continuous Blood Gluc Receiver (FREESTYLE LIBRE 2 READER) DEVI Use as instructed to check blood sugar. 1 each 0   Continuous Blood Gluc Sensor (FREESTYLE LIBRE 2 SENSOR) MISC Change  sensors every 14 days 6 each 3   Eplontersen Sodium (WAINUA) 45 MG/0.8ML SOAJ Inject 45 mg into the skin every 30 (thirty) days. 0.8 mL 12   famotidine (PEPCID) 20 MG tablet Take 1 tablet (20 mg total) by mouth at bedtime. 90 tablet 1   FARXIGA 10 MG TABS tablet TAKE ONE TABLET BY MOUTH ONCE DAILY 90 tablet 3   Fluocinolone Acetonide 0.01 % OIL instill one drop in ears every 2-3 DAYS AS NEEDED FOR pruritis 20 mL 1    gabapentin (NEURONTIN) 600 MG tablet TAKE ONE TABLET BY MOUTH EVERY MORNING and TAKE ONE TABLET BY MOUTH EVERYDAY AT BEDTIME 60 tablet 3   glucose blood (ACCU-CHEK GUIDE) test strip Use to test blood sugar up to 3 times daily 100 each 3   insulin glargine, 1 Unit Dial, (TOUJEO SOLOSTAR) 300 UNIT/ML Solostar Pen Inject 22 Units into the skin daily. Eat a snack with protein nightly before bedtime. 15 mL 6   insulin lispro (HUMALOG KWIKPEN) 100 UNIT/ML KwikPen Max daily 30 units 30 mL 3   Insulin Pen Needle 32G X 4 MM MISC 1 Device by Does not apply route in the morning, at noon, in the evening, and at bedtime. 400 each 3   ipratropium (ATROVENT) 0.06 % nasal spray Place 2 sprays into both nostrils 4 (four) times daily. (Patient taking differently: Place 2 sprays into both nostrils 4 (four) times daily. As needed) 15 mL 3   ketoconazole (NIZORAL) 2 % shampoo Apply 1 application. topically 2 (two) times a week. 120 mL 2   Lancets Misc. (ACCU-CHEK FASTCLIX LANCET) KIT Use to test blood sugar up to 3 times daily 1 kit 1   levothyroxine (SYNTHROID) 150 MCG tablet TAKE ONE TABLET BY MOUTH BEFORE BREAKFAST 90 tablet 3   magnesium oxide (MAG-OX) 400 MG tablet TAKE  ONE TABLET BY MOUTH EVERY MORNING 90 tablet 1   metoprolol succinate (TOPROL-XL) 25 MG 24 hr tablet TAKE ONE TABLET BY MOUTH ONCE DAILY with OR immedaitely following A meal 90 tablet 3   Multiple Vitamin (MULTIVITAMIN) tablet Take 1 tablet by mouth daily. K FREE DAILY once daily (MVI with no vitamin K)     nitroGLYCERIN (NITROSTAT) 0.4 MG SL tablet Dissolve 1 tab under tongue as needed for chest pain. May repeat every 5 minutes x 2 doses. If no relief call 9-1-1. 25 tablet 2   pantoprazole (PROTONIX) 20 MG tablet TAKE ONE TABLET BY MOUTH ONCE DAILY 30 tablet 2   Polyethylene Glycol 3350 (MIRALAX PO) Take 1 Package by mouth daily as needed (constipation).     potassium chloride SA (KLOR-CON M) 20 MEQ tablet TAKE ONE TABLET BY MOUTH ONCE DAILY 90 tablet  1   Propylene Glycol (SYSTANE BALANCE OP) Place 1 drop into both eyes daily as needed (for dry eyes).     SALINE MIST SPRAY NA Place 2 sprays into the nose at bedtime.     Tafamidis 61 MG CAPS Take 1 capsule (61 mg total) by mouth daily. 30 capsule 12   tirzepatide (MOUNJARO) 2.5 MG/0.5ML Pen Inject 2.5 mg into the skin once a week. 6 mL 3   trolamine salicylate (ASPERCREME) 10 % cream Apply 1 application. topically as needed for muscle pain.     warfarin (COUMADIN) 5 MG tablet TAKE 1/2 TABLET BY MOUTH daily OR as directed by coumadin clinic 50 tablet 1   No current facility-administered medications on file prior to visit.    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 01/2015 Cardiolite: + inf wall ischemia, EF 56%;  b. 01/2015 Cath: LM nl, LAD 69m, LCX min irregs, RCA dominant, 50-45m.   Sleep apnea    Swallowing difficulty    Allergies  Allergen Reactions   Ace Inhibitors Other (See Comments)    unknown   Amlodipine Other (See Comments)    unknown   Atenolol Other (See Comments)    bradycardia   Avandia [Rosiglitazone] Other (See Comments)    unknown   Darvon [Propoxyphene] Other (See Comments)    unknown   Erythromycin Itching   Hydralazine Other (See Comments)    Burning in throat and chest   Hydrocodone Other (See Comments)    Hallucinations.   Levofloxacin Itching   Morphine And Related Other (See Comments)    Dizzy and hallucianation, vomiting; Willing to try low dose   Percocet [Oxycodone-Acetaminophen] Other (See Comments)    hallucination   Spironolactone Other (See Comments)    unknown    Tramadol Other (See Comments)    Unknown/does not recall reaction but does not want to take again    Social History   Socioeconomic History   Marital status: Widowed    Spouse name: Not on file   Number of children: Not on file   Years of education: Not on file   Highest education level: Not on file  Occupational History  Occupation: Retired  Tobacco Use   Smoking status: Never   Smokeless tobacco: Never  Vaping Use   Vaping Use: Never used  Substance and Sexual Activity   Alcohol use: No   Drug use: Never   Sexual activity: Not Currently  Other Topics Concern   Not on file  Social History Narrative   Lives with daughter   Social Determinants of Health   Financial Resource Strain: Low Risk  (11/30/2022)   Overall Financial Resource Strain (CARDIA)    Difficulty of Paying Living Expenses: Not very hard  Food Insecurity: No Food Insecurity (11/30/2022)   Hunger Vital Sign    Worried About Running Out of Food in the Last Year: Never true    Ran Out of Food in the Last Year: Never true  Transportation Needs: No Transportation Needs (02/10/2023)   PRAPARE - Administrator, Civil Service (Medical): No    Lack of Transportation (Non-Medical): No  Physical Activity: Inactive (02/16/2022)   Exercise Vital Sign    Days of Exercise per Week: 0 days    Minutes of Exercise per Session: 0 min  Stress: No Stress Concern Present (02/16/2022)   [employer] of Occupational Health - Occupational Stress Questionnaire    Feeling of Stress : Not at all  Social Connections: Moderately Integrated (02/16/2022)   Social Connection and Isolation Panel [NHANES]    Frequency of Communication with Friends and Family: More than three times a week    Frequency of Social Gatherings with Friends and Family: More than three times a week    Attends Religious Services: More than 4 times per year    Active Member of [employer] or Organizations: Yes    Attends Banker Meetings:  More than 4 times per year    Marital Status: Widowed    Vitals:   02/10/23 1517  BP: 124/80  Pulse: 95  Temp: 98.5 F (36.9 C)  SpO2: 98%   Body mass index is 41.52 kg/m.  Physical Exam Vitals and nursing note reviewed.  Constitutional:      General: She is not in acute distress.    Appearance: She is well-developed.  HENT:     Head: Normocephalic and atraumatic.     Mouth/Throat:     Mouth: Mucous membranes are moist.     Pharynx: Oropharynx is clear.  Eyes:     Conjunctiva/sclera: Conjunctivae normal.  Cardiovascular:     Rate and Rhythm: Normal rate and regular rhythm.     Heart sounds: No murmur heard.    Comments: Trace pitting LE edema, bilateral. Pulmonary:     Effort: Pulmonary effort is normal. No respiratory distress.     Breath sounds: Normal breath sounds.  Abdominal:     Palpations: Abdomen is soft. There is no hepatomegaly or mass.     Tenderness: There is no abdominal tenderness.  Lymphadenopathy:     Cervical: No cervical adenopathy.  Skin:    General: Skin is warm.     Findings: No erythema or rash.  Neurological:     General: No focal deficit present.     Mental Status: She is alert and oriented to person, place, and time.     Cranial Nerves: No cranial nerve deficit.     Gait: Gait normal.  Psychiatric:     Comments: Well groomed, good eye contact.     ASSESSMENT AND PLAN:  There are no diagnoses linked to this encounter.  No orders of the defined types  were placed in this encounter.   No problem-specific Assessment & Plan notes found for this encounter.   No follow-ups on file.  Leyana Whidden G. [place], MD  [facility]. [place] office.

## 2023-02-10 NOTE — Telephone Encounter (Signed)
     Patient  visit on 02/06/2023  at [facility]. [facility] was for shortness of breath.  Have you been able to follow up with your primary care physician? Patient followed up with Cardiologist.  The patient was or was not able to obtain any needed medicine or equipment. Patient was able to obtain medication.  Are there diet recommendations that you are having difficulty following? No  Patient expresses understanding of discharge instructions and education provided has no other needs at this time. Yes  Verified patient's mailing address to send [employer] transportation application.   Orton Capell [facility]  [facility] Population Health Community Resource Care Guide   [email or web address]  ?? [phone]   Website: [email or web address]  [email or web address]

## 2023-02-10 NOTE — Telephone Encounter (Signed)
Pt has been made aware to continue current dose of Torsemide, 20 mg taking 2 tablets in the morning, just how it is packaged from [facility].  Pt will call us on Monday if she hasn't heard anything from Heart Failure about an appointment.

## 2023-02-10 NOTE — Patient Instructions (Signed)
A few things to remember from today's visit:  Chronic systolic congestive heart failure (HCC)  Atrial fibrillation, unspecified type (HCC)  Wild-type transthyretin-related (ATTR) amyloidosis (HCC)  No changes today.  If you need refills for medications you take chronically, please call your pharmacy. Do not use My Chart to request refills or for acute issues that need immediate attention. If you send a my chart message, it may take a few days to be addressed, specially if I am not in the office.  Please be sure medication list is accurate. If a new problem present, please set up appointment sooner than planned today.

## 2023-02-11 ENCOUNTER — Encounter: Payer: Self-pay | Admitting: Family Medicine

## 2023-02-11 NOTE — Assessment & Plan Note (Signed)
With associated CKD IV and neuropathy. Currently she is on Eplontersen and Tafamidis. Following with cardiologist.

## 2023-02-11 NOTE — Assessment & Plan Note (Signed)
On chronic anticoagulation, Coumadin. Follows with cardiologist.

## 2023-02-11 NOTE — Assessment & Plan Note (Signed)
We discussed possible causes, this is not a new problem. It could be GERD related. Instructed about warning signs. Follows with cardiologist.

## 2023-02-11 NOTE — Assessment & Plan Note (Signed)
Today rhythm control. She is on coumadin and metoprolol succinate. Follows with cardiologist.

## 2023-02-11 NOTE — Assessment & Plan Note (Signed)
Last BNP went up from 420 in 12/2021 to 750 most recently. She increased Torsemide dose for 3 days and now back to 40 mg daily. Symptoms have improved. Last echo in 01/2023 with LVEF 35-40%. Following with cardiologist.

## 2023-02-13 ENCOUNTER — Other Ambulatory Visit: Payer: Self-pay | Admitting: Family Medicine

## 2023-02-13 ENCOUNTER — Telehealth: Payer: Self-pay | Admitting: Cardiovascular Disease

## 2023-02-13 ENCOUNTER — Other Ambulatory Visit: Payer: Self-pay | Admitting: Cardiovascular Disease

## 2023-02-13 MED ORDER — NITROGLYCERIN 0.4 MG SL SUBL: SUBLINGUAL_TABLET | SUBLINGUAL | 2 refills | Status: DC

## 2023-02-13 NOTE — Telephone Encounter (Signed)
*  STAT* If patient is at the pharmacy, call can be transferred to refill team.   1. Which medications need to be refilled? (please list name of each medication and dose if known) nitroGLYCERIN (NITROSTAT) 0.4 MG SL tablet  2. Which pharmacy/location (including street and city if local pharmacy) is medication to be sent to? [facility] - [place], [place] - [address]  3. Do they need a 30 day or 90 day supply? 90  

## 2023-02-13 NOTE — Telephone Encounter (Signed)
Refills has been sent to the pharmacy. 

## 2023-02-14 ENCOUNTER — Ambulatory Visit: Payer: Medicare Other | Admitting: Physical Therapy

## 2023-02-16 ENCOUNTER — Ambulatory Visit: Payer: Medicare Other | Attending: Cardiovascular Disease | Admitting: Physical Therapy

## 2023-02-20 ENCOUNTER — Ambulatory Visit (INDEPENDENT_AMBULATORY_CARE_PROVIDER_SITE_OTHER): Payer: Medicare Other

## 2023-02-20 VITALS — Ht 62.0 in | Wt 227.0 lb

## 2023-02-20 DIAGNOSIS — Z0000 Encounter for general adult medical examination without abnormal findings: Secondary | ICD-10-CM | POA: Diagnosis not present

## 2023-02-20 NOTE — Progress Notes (Addendum)
Subjective:   Regina [name] is a 82 y.o. female who presents for Medicare Annual (Subsequent) preventive examination.  Review of Systems    Virtual Visit via Telephone Note  I connected with  Cleda Clarks on 02/20/23 at 10:00 AM EDT by telephone and verified that I am speaking with the correct person using two identifiers.  Location: Patient: Home Provider: Office Persons participating in the virtual visit: patient/Nurse Health Advisor   I discussed the limitations, risks, security and privacy concerns of performing an evaluation and management service by telephone and the availability of in person appointments. The patient expressed understanding and agreed to proceed.  Interactive audio and video telecommunications were attempted between this nurse and patient, however failed, due to patient having technical difficulties OR patient did not have access to video capability.  We continued and completed visit with audio only.  Some vital signs may be absent or patient reported.   Tillie Rung, LPN  Cardiac Risk Factors include: advanced age (>52men, >16 women);diabetes mellitus;hypertension     Objective:    Today's Vitals   02/20/23 1008 02/20/23 1011  Weight: 227 lb (103 kg)   Height: 5\' 2"  (1.575 m)   PainSc:  0-No pain   Body mass index is 41.52 kg/m.     02/05/2023    6:37 PM 01/10/2023    2:59 PM 02/16/2022   10:05 AM 12/30/2021   10:02 AM 12/24/2021   10:22 AM 02/22/2021    9:39 AM 02/10/2021    9:45 AM  Advanced Directives  Does Patient Have a Medical Advance Directive? Yes Yes Yes Yes No No;Yes Yes  Type of Advance Directive Living will;Healthcare Power of [employer] Power of [place];Living will Healthcare Power of [place];Living will   Healthcare Power of Attorney  Does patient want to make changes to medical advance directive?  No - Patient declined No - Patient declined No - Patient declined     Copy of Healthcare Power of Attorney in  Chart?   No - copy requested No - copy requested   No - copy requested  Would patient like information on creating a medical advance directive?     No - Patient declined      Current Medications (verified) Outpatient Encounter Medications as of 02/20/2023  Medication Sig   acetaminophen (TYLENOL) 500 MG tablet Take 1,000 mg by mouth every 6 (six) hours as needed for mild pain.   Alcohol Swabs (B-D SINGLE USE SWABS REGULAR) PADS Use to test blood sugar up to 3 times daily   allopurinol (ZYLOPRIM) 100 MG tablet TAKE ONE TABLET BY MOUTH EVERY MORNING   amoxicillin (AMOXIL) 500 MG tablet Take 1,000 mg by mouth 2 (two) times daily. Prior to dental appointments   aspirin EC 81 MG EC tablet Take 1 tablet (81 mg total) by mouth daily. Swallow whole.   atorvastatin (LIPITOR) 20 MG tablet TAKE ONE TABLET BY MOUTH EVERYDAY AT BEDTIME   Blood Glucose Monitoring Suppl (ACCU-CHEK GUIDE) w/Device KIT 1 Device by Does not apply route 3 (three) times daily.   Cholecalciferol (VITAMIN D3) 50 MCG (2000 UT) capsule Take 2,000 Units by mouth daily.   Continuous Blood Gluc Receiver (FREESTYLE LIBRE 2 READER) DEVI Use as instructed to check blood sugar.   Continuous Blood Gluc Sensor (FREESTYLE LIBRE 2 SENSOR) MISC Change  sensors every 14 days   Eplontersen Sodium (WAINUA) 45 MG/0.8ML SOAJ Inject 45 mg into the skin every 30 (thirty) days.   famotidine (PEPCID)  20 MG tablet Take 1 tablet (20 mg total) by mouth at bedtime.   FARXIGA 10 MG TABS tablet TAKE ONE TABLET BY MOUTH ONCE DAILY   Fluocinolone Acetonide 0.01 % OIL instill one drop in ears every 2-3 DAYS AS NEEDED FOR pruritis   gabapentin (NEURONTIN) 600 MG tablet TAKE ONE TABLET BY MOUTH EVERY MORNING and TAKE ONE TABLET BY MOUTH EVERYDAY AT BEDTIME   glucose blood (ACCU-CHEK GUIDE) test strip Use to test blood sugar up to 3 times daily   insulin glargine, 1 Unit Dial, (TOUJEO SOLOSTAR) 300 UNIT/ML Solostar Pen Inject 22 Units into the skin daily. Eat a snack  with protein nightly before bedtime.   insulin lispro (HUMALOG KWIKPEN) 100 UNIT/ML KwikPen Max daily 30 units   Insulin Pen Needle 32G X 4 MM MISC 1 Device by Does not apply route in the morning, at noon, in the evening, and at bedtime.   ipratropium (ATROVENT) 0.06 % nasal spray Place 2 sprays into both nostrils 4 (four) times daily. (Patient taking differently: Place 2 sprays into both nostrils 4 (four) times daily. As needed)   ketoconazole (NIZORAL) 2 % shampoo Apply 1 application. topically 2 (two) times a week.   Lancets Misc. (ACCU-CHEK FASTCLIX LANCET) KIT Use to test blood sugar up to 3 times daily   levothyroxine (SYNTHROID) 150 MCG tablet TAKE ONE TABLET BY MOUTH BEFORE BREAKFAST   magnesium oxide (MAG-OX) 400 MG tablet TAKE ONE TABLET BY MOUTH EVERY MORNING   metoprolol succinate (TOPROL-XL) 25 MG 24 hr tablet TAKE ONE TABLET BY MOUTH ONCE DAILY with OR immedaitely following A meal   Multiple Vitamin (MULTIVITAMIN) tablet Take 1 tablet by mouth daily. K FREE DAILY once daily (MVI with no vitamin K)   nitroGLYCERIN (NITROSTAT) 0.4 MG SL tablet Dissolve 1 tab under tongue as needed for chest pain. May repeat every 5 minutes x 2 doses. If no relief call 9-1-1.   pantoprazole (PROTONIX) 20 MG tablet TAKE ONE TABLET BY MOUTH ONCE DAILY   Polyethylene Glycol 3350 (MIRALAX PO) Take 1 Package by mouth daily as needed (constipation).   potassium chloride SA (KLOR-CON M) 20 MEQ tablet TAKE ONE TABLET BY MOUTH ONCE DAILY   Propylene Glycol (SYSTANE BALANCE OP) Place 1 drop into both eyes daily as needed (for dry eyes).   SALINE MIST SPRAY NA Place 2 sprays into the nose at bedtime.   Tafamidis 61 MG CAPS Take 1 capsule (61 mg total) by mouth daily.   tirzepatide [facility]) 2.5 MG/0.5ML Pen Inject 2.5 mg into the skin once a week.   torsemide (DEMADEX) 20 MG tablet TAKE TWO TABLETS BY MOUTH ONCE DAILY   trolamine salicylate (ASPERCREME) 10 % cream Apply 1 application. topically as needed for  muscle pain.   warfarin (COUMADIN) 5 MG tablet TAKE 1/2 TABLET BY MOUTH daily OR as directed by coumadin clinic   No facility-administered encounter medications on file as of 02/20/2023.    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 01/2015 Cardiolite: + inf wall ischemia, EF 56%;  b. 01/2015 Cath: LM nl, LAD 16m, LCX min irregs, RCA dominant, 50-29m.   Sleep apnea    Swallowing difficulty    Past Surgical History:  Procedure Laterality Date   ABDOMINAL HYSTERECTOMY     BUBBLE STUDY  07/01/2020   Procedure: BUBBLE STUDY;  Surgeon: Parke Poisson, MD;  Location: [facility] ENDOSCOPY;  Service: Cardiovascular;;   BUBBLE STUDY  08/05/2020   Procedure: BUBBLE STUDY;  Surgeon: Sande Rives, MD;  Location: [facility] ENDOSCOPY;  Service: Cardiovascular;;  done with definity    CARDIAC CATHETERIZATION     ESOPHAGOGASTRODUODENOSCOPY     a. 01/2015 EGD: patent esophagus.   ESOPHAGOGASTRODUODENOSCOPY N/A 01/20/2015   Procedure: ESOPHAGOGASTRODUODENOSCOPY (EGD);  Surgeon: Jeani Hawking, MD;  Location: [facility] ENDOSCOPY;  Service: Endoscopy;  Laterality: N/A;   HAND SURGERY     JOINT REPLACEMENT     reports history bilateral TKA and right TSA   LEFT HEART CATHETERIZATION WITH CORONARY ANGIOGRAM N/A 01/19/2015   RIGHT HEART CATH N/A 12/27/2021   Procedure: RIGHT HEART CATH;   Surgeon: Runell Gess, MD;  Location: [facility] INVASIVE CV LAB;  Service: Cardiovascular;  Laterality: N/A;   TEE WITHOUT CARDIOVERSION  05/04/2020   TEE WITHOUT CARDIOVERSION N/A 05/05/2020   Procedure: TRANSESOPHAGEAL ECHOCARDIOGRAM (TEE);  Surgeon: Chrystie Nose, MD;  Location: [facility] ENDOSCOPY;  Service: Cardiovascular;  Laterality: N/A;   TEE WITHOUT CARDIOVERSION N/A 07/01/2020   Procedure: TRANSESOPHAGEAL ECHOCARDIOGRAM (TEE);  Surgeon: Parke Poisson, MD;  Location: [facility] ENDOSCOPY;  Service: Cardiovascular;  Laterality: N/A;   TEE WITHOUT CARDIOVERSION N/A 08/05/2020   Procedure: TRANSESOPHAGEAL ECHOCARDIOGRAM (TEE);  Surgeon: Sande Rives, MD;  Location: [facility] ENDOSCOPY;  Service: Cardiovascular;  Laterality: N/A;   THYROIDECTOMY     Family History  Problem Relation Age of Onset   Heart disease Mother    Hypertension Mother    Cancer Father    Alcoholism Father    Social History   Socioeconomic History   Marital status: Widowed    Spouse name: Not on file   Number of children: Not on file   Years of education: Not on file   Highest education level: Not on file  Occupational History   Occupation: Retired  Tobacco Use   Smoking status: Never   Smokeless tobacco: Never  Vaping Use   Vaping Use: Never used  Substance and Sexual Activity   Alcohol use: No   Drug use: Never   Sexual activity: Not Currently  Other Topics Concern   Not on file  Social History Narrative   Lives with daughter   Social Determinants of Health   Financial Resource Strain: Low Risk  (02/20/2023)   Overall Financial Resource Strain (CARDIA)    Difficulty of Paying Living Expenses: Not hard at all  Food Insecurity: No Food Insecurity (02/20/2023)   Hunger Vital Sign    Worried About Running Out of Food in the Last Year: Never true    Ran Out of Food in the Last Year: Never true  Transportation Needs: No Transportation Needs (02/20/2023)   PRAPARE - Administrator, Civil Service  (Medical): No    Lack of Transportation (Non-Medical): No  Physical Activity: Inactive (02/20/2023)   Exercise Vital Sign    Days of Exercise per Week: 0 days    Minutes of Exercise per Session: 0 min  Stress:  No Stress Concern Present (02/20/2023)   [employer] of Occupational Health - Occupational Stress Questionnaire    Feeling of Stress : Not at all  Social Connections: Moderately Integrated (02/20/2023)   Social Connection and Isolation Panel [NHANES]    Frequency of Communication with Friends and Family: More than three times a week    Frequency of Social Gatherings with Friends and Family: More than three times a week    Attends Religious Services: More than 4 times per year    Active Member of [employer] or Organizations: Yes    Attends Banker Meetings: More than 4 times per year    Marital Status: Widowed    Tobacco Counseling Counseling given: Not Answered   Clinical Intake:  Pre-visit preparation completed: No  Pain : No/denies pain Pain Score: 0-No pain   Nutrition Risk Assessment:  Has the patient had any N/V/D within the last 2 months?  No  Does the patient have any non-healing wounds?  No  Has the patient had any unintentional weight loss or weight gain?  No   Diabetes:  Is the patient diabetic?  Yes  If diabetic, was a CBG obtained today?  Yes CBG 61 Taken by patient Did the patient bring in their glucometer from home?  No  How often do you monitor your CBG's? Daily/Monitor   Financial Strains and Diabetes Management:  Are you having any financial strains with the device, your supplies or your medication? No .  Does the patient want to be seen by Chronic Care Management for management of their diabetes?  No  Would the patient like to be referred to a Nutritionist or for Diabetic Management?  No   Diabetic Exams:  Diabetic Eye Exam: Completed . Overdue for diabetic eye exam. Pt has been advised about the importance in completing this exam. A  referral has been placed today. Message sent to referral coordinator for scheduling purposes. Advised pt to expect a call from office referred to regarding appt.  Diabetic Foot Exam: Completed . Pt has been advised about the importance in completing this exam. Pt is scheduled for diabetic foot exam on Followed by Dr Lonzo Cloud   BMI - recorded: 41.52 Nutritional Risks: None Diabetes: Yes CBG done?: Yes (CBG 61 Taken by patient) CBG resulted in Enter/ Edit results?: Yes Did pt. bring in CBG monitor from home?: No  How often do you need to have someone help you when you read instructions, pamphlets, or other written materials from your doctor or pharmacy?: 1 - Never  Diabetic?  Yes  Interpreter Needed?: No  Information entered by :: Theresa Mulligan LPN   Activities of Daily Living    02/20/2023   10:19 AM  In your present state of health, do you have any difficulty performing the following activities:  Hearing? 0  Vision? 0  Difficulty concentrating or making decisions? 0  Walking or climbing stairs? 0  Dressing or bathing? 0  Doing errands, shopping? 0  Preparing Food and eating ? N  Using the Toilet? N  In the past six months, have you accidently leaked urine? N  Do you have problems with loss of bowel control? N  Managing your Medications? N  Managing your Finances? N  Housekeeping or managing your Housekeeping? N    Patient Care Team: [place], [name], MD as PCP - General (Family Medicine) O'Neal, Ronnald Ramp, MD as PCP - Cardiology (Internal Medicine) Leitha Bleak Milas Kocher, [facility] (Pharmacist)  Indicate any recent Medical Services you  may have received from other than [facility] providers in the past year (date may be approximate).     Assessment:   This is a routine wellness examination for Haleigha.  Hearing/Vision screen Hearing Screening - Comments:: Denies hearing difficulties   Vision Screening - Comments:: Wears rx glasses - up to date with routine eye exams with  Dr  Dione Booze  Dietary issues and exercise activities discussed: Exercise limited by: None identified   Goals Addressed               This Visit's Progress     Stay Healthy (pt-stated)         Depression Screen    02/20/2023   10:18 AM 12/20/2022   11:14 AM 09/19/2022   11:05 AM 03/18/2022   12:03 PM 02/16/2022   10:00 AM 01/04/2022    2:07 PM 12/30/2021   10:02 AM  PHQ 2/9 Scores  PHQ - 2 Score 0 0 0 0 0 0 0    Fall Risk    02/20/2023   10:19 AM 12/20/2022   11:14 AM 09/19/2022   11:05 AM 03/18/2022   12:03 PM 02/16/2022   10:03 AM  Fall Risk   Falls in the past year? 1 0 1 1 1   Number falls in past yr: 0 0 0 1 0  Injury with Fall? 1 0 1 0 0  Comment Injury to left shoulder, Followed by medical attention.    No injuries or medical attention needed  Risk for fall due to : No Fall Risks Other (Comment) History of fall(s) History of fall(s) No Fall Risks  Follow up Falls prevention discussed Falls evaluation completed Falls evaluation completed Falls evaluation completed     FALL RISK PREVENTION PERTAINING TO THE HOME:  Any stairs in or around the home? Yes  If so, are there any without handrails? No  Home free of loose throw rugs in walkways, pet beds, electrical cords, etc? Yes  Adequate lighting in your home to reduce risk of falls? Yes   ASSISTIVE DEVICES UTILIZED TO PREVENT FALLS:  Life alert? No  Use of a cane, walker or w/c? Yes Grab bars in the bathroom? No  Shower chair or bench in shower? No  Elevated toilet seat or a handicapped toilet? No   TIMED UP AND GO:  Was the test performed? No . Audio Visit   Cognitive Function:        02/20/2023   10:22 AM 02/16/2022   10:08 AM 02/10/2021    9:49 AM  6CIT Screen  What Year? 0 points 0 points 0 points  What month? 0 points 0 points 0 points  What time? 0 points 0 points   Count back from 20 0 points 0 points 0 points  Months in reverse 0 points 0 points 0 points  Repeat phrase 0 points 0 points 0 points  Total Score  0 points 0 points     Immunizations Immunization History  Administered Date(s) Administered   Fluad Quad(high Dose 65+) 06/21/2019, 08/01/2022   Influenza,inj,Quad PF,6+ Mos 01/17/2015   Influenza-Unspecified 01/17/2015, 10/25/2016   PFIZER(Purple Top)SARS-COV-2 Vaccination 11/06/2019, 11/27/2019, 08/12/2020, 10/23/2021   PNEUMOCOCCAL CONJUGATE-20 12/28/2021   Pneumococcal Polysaccharide-23 05/01/2020   Zoster Recombinat (Shingrix) 10/23/2021, 10/23/2021    TDAP status: Due, Education has been provided regarding the importance of this vaccine. Advised may receive this vaccine at local pharmacy or Health Dept. Aware to provide a copy of the vaccination record if obtained from local pharmacy or Health Dept.  Verbalized acceptance and understanding.  Flu Vaccine status: Up to date  Pneumococcal vaccine status: Up to date  Covid-19 vaccine status: Completed vaccines  Qualifies for Shingles Vaccine? Yes   Zostavax completed Yes   Shingrix Completed?: Yes  Screening Tests Health Maintenance  Topic Date Due   DTaP/Tdap/Td (1 - Tdap) Never done   COVID-19 Vaccine (5 - 2023-24 season) 03/08/2023 (Originally 06/17/2022)   Zoster Vaccines- Shingrix (2 of 2) 05/23/2023 (Originally 12/18/2021)   INFLUENZA VACCINE  05/18/2023   OPHTHALMOLOGY EXAM  05/19/2023   HEMOGLOBIN A1C  08/02/2023   Diabetic kidney evaluation - Urine ACR  11/17/2023   FOOT EXAM  01/31/2024   Diabetic kidney evaluation - eGFR measurement  02/10/2024   Medicare Annual Wellness (AWV)  02/20/2024   Pneumonia Vaccine 57+ Years old  Completed   DEXA SCAN  Completed   Hepatitis C Screening  Completed   HPV VACCINES  Aged Out    Health Maintenance  Health Maintenance Due  Topic Date Due   DTaP/Tdap/Td (1 - Tdap) Never done    Colorectal cancer screening: No longer required.   Mammogram status: No longer required due to Age.  Bone Density status: Completed 10/17/16. Results reflect: Bone density results:  OSTEOPOROSIS. Repeat every   years.  Lung Cancer Screening: (Low Dose CT Chest recommended if Age 33-80 years, 30 pack-year currently smoking OR have quit w/in 15years.) does not qualify.   Lung Cancer Screening Referral:   Additional Screening:  Hepatitis C Screening: does qualify; Completed 05/21/19  Vision Screening: Recommended annual ophthalmology exams for early detection of glaucoma and other disorders of the eye. Is the patient up to date with their annual eye exam?  Yes  Who is the provider or what is the name of the office in which the patient attends annual eye exams? Dr Dione Booze If pt is not established with a provider, would they like to be referred to a provider to establish care? No .   Dental Screening: Recommended annual dental exams for proper oral hygiene  Community Resource Referral / Chronic Care Management:  CRR required this visit?  No   CCM required this visit?  No      Plan:     I have personally reviewed and noted the following in the patient's chart:   Medical and social history Use of alcohol, tobacco or illicit drugs  Current medications and supplements including opioid prescriptions. Patient is not currently taking opioid prescriptions. Functional ability and status Nutritional status Physical activity Advanced directives List of other physicians Hospitalizations, surgeries, and ER visits in previous 12 months Vitals Screenings to include cognitive, depression, and falls Referrals and appointments  In addition, I have reviewed and discussed with patient certain preventive protocols, quality metrics, and best practice recommendations. A written personalized care plan for preventive services as well as general preventive health recommendations were provided to patient.     Tillie Rung, LPN   10/22/1094   Nurse Notes: None

## 2023-02-20 NOTE — Patient Instructions (Addendum)
Ms. Reddig , Thank you for taking time to come for your Medicare Wellness Visit. I appreciate your ongoing commitment to your health goals. Please review the following plan we discussed and let me know if I can assist you in the future.   These are the goals we discussed:  Goals       Manage My Medicine      Timeframe:  Short-Term Goal Priority:  Medium Start Date:                             Expected End Date:                       Follow Up Date 07/15/2021    - call for medicine refill 2 or 3 days before it runs out - keep a list of all the medicines I take; vitamins and herbals too - use a pillbox to sort medicine - use an alarm clock or phone to remind me to take my medicine    Why is this important?   These steps will help you keep on track with your medicines.   Notes:       Patient Stated (pt-stated)      Manage my diabetes.      Stay Healthy (pt-stated)        This is a list of the screening recommended for you and due dates:  Health Maintenance  Topic Date Due   DTaP/Tdap/Td vaccine (1 - Tdap) Never done   COVID-19 Vaccine (5 - 2023-24 season) 03/08/2023*   Zoster (Shingles) Vaccine (2 of 2) 05/23/2023*   Flu Shot  05/18/2023   Eye exam for diabetics  05/19/2023   Hemoglobin A1C  08/02/2023   Yearly kidney health urinalysis for diabetes  11/17/2023   Complete foot exam   01/31/2024   Yearly kidney function blood test for diabetes  02/10/2024   Medicare Annual Wellness Visit  02/20/2024   Pneumonia Vaccine  Completed   DEXA scan (bone density measurement)  Completed   Hepatitis C Screening: USPSTF Recommendation to screen - Ages 62-79 yo.  Completed   HPV Vaccine  Aged Out  *Topic was postponed. The date shown is not the original due date.    Advanced directives: Please bring a copy of your health care power of attorney and living will to the office to be added to your chart at your convenience.   Conditions/risks identified: None  Next appointment:  Follow up in one year for your annual wellness visit    Preventive Care 65 Years and Older, Female Preventive care refers to lifestyle choices and visits with your health care provider that can promote health and wellness. What does preventive care include? A yearly physical exam. This is also called an annual well check. Dental exams once or twice a year. Routine eye exams. Ask your health care provider how often you should have your eyes checked. Personal lifestyle choices, including: Daily care of your teeth and gums. Regular physical activity. Eating a healthy diet. Avoiding tobacco and drug use. Limiting alcohol use. Practicing safe sex. Taking low-dose aspirin every day. Taking vitamin and mineral supplements as recommended by your health care provider. What happens during an annual well check? The services and screenings done by your health care provider during your annual well check will depend on your age, overall health, lifestyle risk factors, and family history of disease. Counseling  Your health care  provider may ask you questions about your: Alcohol use. Tobacco use. Drug use. Emotional well-being. Home and relationship well-being. Sexual activity. Eating habits. History of falls. Memory and ability to understand (cognition). Work and work Astronomer. Reproductive health. Screening  You may have the following tests or measurements: Height, weight, and BMI. Blood pressure. Lipid and cholesterol levels. These may be checked every 5 years, or more frequently if you are over 86 years old. Skin check. Lung cancer screening. You may have this screening every year starting at age 81 if you have a 30-pack-year history of smoking and currently smoke or have quit within the past 15 years. Fecal occult blood test (FOBT) of the stool. You may have this test every year starting at age 78. Flexible sigmoidoscopy or colonoscopy. You may have a sigmoidoscopy every 5 years or a  colonoscopy every 10 years starting at age 8. Hepatitis C blood test. Hepatitis B blood test. Sexually transmitted disease (STD) testing. Diabetes screening. This is done by checking your blood sugar (glucose) after you have not eaten for a while (fasting). You may have this done every 1-3 years. Bone density scan. This is done to screen for osteoporosis. You may have this done starting at age 54. Mammogram. This may be done every 1-2 years. Talk to your health care provider about how often you should have regular mammograms. Talk with your health care provider about your test results, treatment options, and if necessary, the need for more tests. Vaccines  Your health care provider may recommend certain vaccines, such as: Influenza vaccine. This is recommended every year. Tetanus, diphtheria, and acellular pertussis (Tdap, Td) vaccine. You may need a Td booster every 10 years. Zoster vaccine. You may need this after age 43. Pneumococcal 13-valent conjugate (PCV13) vaccine. One dose is recommended after age 38. Pneumococcal polysaccharide (PPSV23) vaccine. One dose is recommended after age 6. Talk to your health care provider about which screenings and vaccines you need and how often you need them. This information is not intended to replace advice given to you by your health care provider. Make sure you discuss any questions you have with your health care provider. Document Released: 10/30/2015 Document Revised: 06/22/2016 Document Reviewed: 08/04/2015 Elsevier Interactive Patient Education  2017 [employer].  Fall Prevention in the Home Falls can cause injuries. They can happen to people of all ages. There are many things you can do to make your home safe and to help prevent falls. What can I do on the outside of my home? Regularly fix the edges of walkways and driveways and fix any cracks. Remove anything that might make you trip as you walk through a door, such as a raised step or  threshold. Trim any bushes or trees on the path to your home. Use bright outdoor lighting. Clear any walking paths of anything that might make someone trip, such as rocks or tools. Regularly check to see if handrails are loose or broken. Make sure that both sides of any steps have handrails. Any raised decks and porches should have guardrails on the edges. Have any leaves, snow, or ice cleared regularly. Use sand or salt on walking paths during winter. Clean up any spills in your garage right away. This includes oil or grease spills. What can I do in the bathroom? Use night lights. Install grab bars by the toilet and in the tub and shower. Do not use towel bars as grab bars. Use non-skid mats or decals in the tub or shower. If  you need to sit down in the shower, use a plastic, non-slip stool. Keep the floor dry. Clean up any water that spills on the floor as soon as it happens. Remove soap buildup in the tub or shower regularly. Attach bath mats securely with double-sided non-slip rug tape. Do not have throw rugs and other things on the floor that can make you trip. What can I do in the bedroom? Use night lights. Make sure that you have a light by your bed that is easy to reach. Do not use any sheets or blankets that are too big for your bed. They should not hang down onto the floor. Have a firm chair that has side arms. You can use this for support while you get dressed. Do not have throw rugs and other things on the floor that can make you trip. What can I do in the kitchen? Clean up any spills right away. Avoid walking on wet floors. Keep items that you use a lot in easy-to-reach places. If you need to reach something above you, use a strong step stool that has a grab bar. Keep electrical cords out of the way. Do not use floor polish or wax that makes floors slippery. If you must use wax, use non-skid floor wax. Do not have throw rugs and other things on the floor that can make you  trip. What can I do with my stairs? Do not leave any items on the stairs. Make sure that there are handrails on both sides of the stairs and use them. Fix handrails that are broken or loose. Make sure that handrails are as long as the stairways. Check any carpeting to make sure that it is firmly attached to the stairs. Fix any carpet that is loose or worn. Avoid having throw rugs at the top or bottom of the stairs. If you do have throw rugs, attach them to the floor with carpet tape. Make sure that you have a light switch at the top of the stairs and the bottom of the stairs. If you do not have them, ask someone to add them for you. What else can I do to help prevent falls? Wear shoes that: Do not have high heels. Have rubber bottoms. Are comfortable and fit you well. Are closed at the toe. Do not wear sandals. If you use a stepladder: Make sure that it is fully opened. Do not climb a closed stepladder. Make sure that both sides of the stepladder are locked into place. Ask someone to hold it for you, if possible. Clearly mark and make sure that you can see: Any grab bars or handrails. First and last steps. Where the edge of each step is. Use tools that help you move around (mobility aids) if they are needed. These include: Canes. Walkers. Scooters. Crutches. Turn on the lights when you go into a dark area. Replace any light bulbs as soon as they burn out. Set up your furniture so you have a clear path. Avoid moving your furniture around. If any of your floors are uneven, fix them. If there are any pets around you, be aware of where they are. Review your medicines with your doctor. Some medicines can make you feel dizzy. This can increase your chance of falling. Ask your doctor what other things that you can do to help prevent falls. This information is not intended to replace advice given to you by your health care provider. Make sure you discuss any questions you have with your  health care provider. Document Released: 07/30/2009 Document Revised: 03/10/2016 Document Reviewed: 11/07/2014 Elsevier Interactive Patient Education  2017 [employer].

## 2023-02-21 ENCOUNTER — Ambulatory Visit: Payer: Medicare Other | Admitting: Physical Therapy

## 2023-02-21 ENCOUNTER — Encounter (HOSPITAL_COMMUNITY): Payer: Self-pay | Admitting: Internal Medicine

## 2023-02-21 ENCOUNTER — Other Ambulatory Visit (HOSPITAL_COMMUNITY): Payer: Self-pay

## 2023-02-21 ENCOUNTER — Other Ambulatory Visit (HOSPITAL_COMMUNITY): Payer: Self-pay | Admitting: Internal Medicine

## 2023-02-21 ENCOUNTER — Ambulatory Visit (HOSPITAL_COMMUNITY)
Admission: RE | Admit: 2023-02-21 | Discharge: 2023-02-21 | Disposition: A | Discharge status: Home / Self Care | Payer: Medicare Other | Source: Ambulatory Visit | Attending: Internal Medicine | Admitting: Internal Medicine

## 2023-02-21 ENCOUNTER — Inpatient Hospital Stay (HOSPITAL_COMMUNITY)
Admission: RE | Admit: 2023-02-21 | Discharge: 2023-02-21 | Disposition: A | Discharge status: Home / Self Care | Payer: Medicare Other | Source: Ambulatory Visit

## 2023-02-21 VITALS — BP 124/70 | HR 88 | Wt 226.8 lb

## 2023-02-21 DIAGNOSIS — G4733 Obstructive sleep apnea (adult) (pediatric): Secondary | ICD-10-CM | POA: Diagnosis not present

## 2023-02-21 DIAGNOSIS — M353 Polymyalgia rheumatica: Secondary | ICD-10-CM | POA: Insufficient documentation

## 2023-02-21 DIAGNOSIS — I5022 Chronic systolic (congestive) heart failure: Secondary | ICD-10-CM

## 2023-02-21 DIAGNOSIS — N184 Chronic kidney disease, stage 4 (severe): Secondary | ICD-10-CM | POA: Diagnosis not present

## 2023-02-21 DIAGNOSIS — Z8673 Personal history of transient ischemic attack (TIA), and cerebral infarction without residual deficits: Secondary | ICD-10-CM | POA: Insufficient documentation

## 2023-02-21 DIAGNOSIS — Z7901 Long term (current) use of anticoagulants: Secondary | ICD-10-CM | POA: Insufficient documentation

## 2023-02-21 DIAGNOSIS — E851 Neuropathic heredofamilial amyloidosis: Secondary | ICD-10-CM | POA: Insufficient documentation

## 2023-02-21 DIAGNOSIS — Z7984 Long term (current) use of oral hypoglycemic drugs: Secondary | ICD-10-CM | POA: Insufficient documentation

## 2023-02-21 DIAGNOSIS — I4892 Unspecified atrial flutter: Secondary | ICD-10-CM | POA: Insufficient documentation

## 2023-02-21 DIAGNOSIS — I4821 Permanent atrial fibrillation: Secondary | ICD-10-CM | POA: Diagnosis not present

## 2023-02-21 DIAGNOSIS — Z79899 Other long term (current) drug therapy: Secondary | ICD-10-CM | POA: Insufficient documentation

## 2023-02-21 DIAGNOSIS — Z7982 Long term (current) use of aspirin: Secondary | ICD-10-CM | POA: Insufficient documentation

## 2023-02-21 DIAGNOSIS — E854 Organ-limited amyloidosis: Secondary | ICD-10-CM | POA: Diagnosis not present

## 2023-02-21 DIAGNOSIS — E1122 Type 2 diabetes mellitus with diabetic chronic kidney disease: Secondary | ICD-10-CM | POA: Diagnosis not present

## 2023-02-21 DIAGNOSIS — Z7985 Long-term (current) use of injectable non-insulin antidiabetic drugs: Secondary | ICD-10-CM | POA: Insufficient documentation

## 2023-02-21 DIAGNOSIS — I43 Cardiomyopathy in diseases classified elsewhere: Secondary | ICD-10-CM | POA: Diagnosis not present

## 2023-02-21 DIAGNOSIS — Z794 Long term (current) use of insulin: Secondary | ICD-10-CM | POA: Diagnosis not present

## 2023-02-21 DIAGNOSIS — I130 Hypertensive heart and chronic kidney disease with heart failure and stage 1 through stage 4 chronic kidney disease, or unspecified chronic kidney disease: Secondary | ICD-10-CM | POA: Insufficient documentation

## 2023-02-21 DIAGNOSIS — I4819 Other persistent atrial fibrillation: Secondary | ICD-10-CM | POA: Diagnosis not present

## 2023-02-21 DIAGNOSIS — I2510 Atherosclerotic heart disease of native coronary artery without angina pectoris: Secondary | ICD-10-CM | POA: Diagnosis not present

## 2023-02-21 DIAGNOSIS — G63 Polyneuropathy in diseases classified elsewhere: Secondary | ICD-10-CM | POA: Insufficient documentation

## 2023-02-21 LAB — BASIC METABOLIC PANEL
Anion gap: 13 (ref 5–15)
BUN: 46 mg/dL — ABNORMAL HIGH (ref 8–23)
CO2: 27 mmol/L (ref 22–32)
Calcium: 9.3 mg/dL (ref 8.9–10.3)
Chloride: 96 mmol/L — ABNORMAL LOW (ref 98–111)
Creatinine, Ser: 2.11 mg/dL — ABNORMAL HIGH (ref 0.44–1.00)
GFR, Estimated: 23 mL/min — ABNORMAL LOW (ref >60)
Glucose, Bld: 129 mg/dL — ABNORMAL HIGH (ref 70–99)
Potassium: 4.2 mmol/L (ref 3.5–5.1)
Sodium: 136 mmol/L (ref 135–145)

## 2023-02-21 LAB — BRAIN NATRIURETIC PEPTIDE: B Natriuretic Peptide: 578.9 pg/mL — ABNORMAL HIGH (ref 0.0–100.0)

## 2023-02-21 MED ORDER — ENTRESTO 24-26 MG PO TABS: 1.0000 | ORAL_TABLET | Freq: Two times a day (BID) | ORAL | 0 refills | Status: DC

## 2023-02-21 MED ORDER — FARXIGA 10 MG PO TABS
10.0000 mg | ORAL_TABLET | Freq: Every day | ORAL | 3 refills | Status: DC
  Filled 2023-02-21: qty 90, 90d supply, fill #0

## 2023-02-21 MED ORDER — ATORVASTATIN CALCIUM 20 MG PO TABS
ORAL_TABLET | ORAL | 3 refills | Status: DC
  Filled 2023-02-21: qty 90, 90d supply, fill #0

## 2023-02-21 MED ORDER — TORSEMIDE 20 MG PO TABS
20.0000 mg | ORAL_TABLET | Freq: Every day | ORAL | 3 refills | Status: DC
  Filled 2023-02-21: qty 90, 90d supply, fill #0

## 2023-02-21 MED ORDER — METOPROLOL SUCCINATE ER 25 MG PO TB24
ORAL_TABLET | ORAL | 3 refills | Status: DC
  Filled 2023-02-21: qty 90, 90d supply, fill #0

## 2023-02-21 MED ORDER — TORSEMIDE 20 MG PO TABS
40.0000 mg | ORAL_TABLET | Freq: Every day | ORAL | 3 refills | Status: DC
  Filled 2023-02-21: qty 180, 90d supply, fill #0
  Filled 2023-02-27: qty 15, 7d supply, fill #0

## 2023-02-21 MED ORDER — ENTRESTO 24-26 MG PO TABS
1.0000 | ORAL_TABLET | Freq: Two times a day (BID) | ORAL | 3 refills | Status: DC
  Filled 2023-02-21: qty 90, 45d supply, fill #0

## 2023-02-21 NOTE — Progress Notes (Signed)
[facility]ED [facility] CLINIC CONSULT NOTE  Referring Physician: Reatha Harps, MD Primary Care: [place], [name], MD Primary Cardiologist: Reatha Harps, MD  HPI:  Regina [name] is a 82 y.o. woman with PMHx as below. Referred by Dr. Flora Lipps for further evaluation of he hATTR cardiac amyloidosis   Problem List 1.  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 Chronic AF -LAA sludge vs thrombus 05/05/2020  -persistent sludge 07/01/2020 -LAA thrombus persistent 08/05/2020 -on warfarin 3. DM -A1c 7.3 4. HTN 5. Non-obstructive CAD -LHC 01/19/2015 [facility] -50% mid RCA and 50% LAD -MPI 08/14/2020 -> normal 6. OSA 7. CKD 4 (b/l Scr ~2.0) 8. CVA -11/2020 @ [facility]  9. Polymyalgia Rheumatica  10. Morbid obesity  Currently on tafamadis (started 2021) and eplonteresen (started 2/24). Seen in f/u by Dr. Flora Lipps on 01/06/2023 with volume overload He increased her torsemide to 40/20  Echo repeated 02/03/2023 LVEF 40% with normal pressures.   She was seen in the ER 02/06/2023 for shortness of breath.  She was given IV Lasix with good result and discharged without admission.  She was seen by Bettina Gavia, PA on 02/09/23. Weight up 10 pounds despite mistakenly taing 120mg  torsemide daily. Labs with stable Scr 2.01  Here with her daughter Regina [name]. SOB with any activity. + bendopnea. Very fatigued. Taking 2 torsemides with her pill pack in the am and then takes another one in the afternoon from her bottle. Still some edema but improved. Wears CPAP. Takes BP at home. Systolics run 120-140    ReDs today 02/20/26 39%   Review of Systems: [y] = yes, [ ]  = no   General: Weight gain [ ] ; Weight loss [ ] ; Anorexia [ ] ; Fatigue [ y]; Fever [ ] ; Chills [ ] ; Weakness [ ]   Cardiac: Chest pain/pressure [  ]; Resting SOB [ ] ; Exertional SOB [ y]; Orthopnea [ y]; Pedal Edema [ ] ; Palpitations [ ] ; Syncope [ ] ; Presyncope [ ] ; Paroxysmal nocturnal dyspnea[ ]   Pulmonary: Cough [ ] ; Wheezing[ ] ; Hemoptysis[ ] ; Sputum [ ] ; Snoring [ ]   GI: Vomiting[ ] ; Dysphagia[ ] ; Melena[ ] ; Hematochezia [ ] ; Heartburn[ ] ; Abdominal pain [ ] ; Constipation [ ] ; Diarrhea [ ] ; BRBPR [ ]   GU: Hematuria[ ] ; Dysuria [ ] ; Nocturia[ ]   Vascular: Pain in legs with walking [ ] ; Pain in feet with lying flat [ ] ; Non-healing sores [ ] ; Stroke [ ] ; TIA [ ] ; Slurred speech [ ] ;  Neuro: Headaches[ ] ; Vertigo[ ] ; Seizures[ ] ; Paresthesias[ ] ;Blurred vision [ ] ; Diplopia [ ] ; Vision changes [ ]   Ortho/Skin: Arthritis [ y]; Joint pain [ y]; Muscle pain [ ] ; Joint swelling [ ] ; Back Pain [ ] ; Rash [ ]   Psych: Depression[ ] ; Anxiety[ ]   Heme: Bleeding problems [ ] ; Clotting disorders [ ] ; Anemia [ ]   Endocrine: Diabetes [ y]; Thyroid dysfunction[ ]    Past Medical History:  Diagnosis Date   Back pain    CHF (congestive heart failure) (HCC)    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 01/2015 Cardiolite: + inf wall ischemia, EF 56%;  b. 01/2015 Cath: LM nl, LAD 58m, LCX min irregs, RCA dominant, 50-5m.   Sleep apnea    Swallowing difficulty     Current Outpatient Medications  Medication Sig Dispense Refill   acetaminophen (TYLENOL) 500 MG tablet Take 1,000 mg by mouth every 6 (six) hours as needed for mild pain.     Alcohol Swabs (B-D SINGLE USE SWABS REGULAR) PADS Use to test blood sugar up to 3 times daily 100 each 3   allopurinol (ZYLOPRIM) 100 MG tablet TAKE ONE TABLET BY MOUTH EVERY MORNING 90 tablet 1   amoxicillin (AMOXIL) 500 MG tablet  Take 1,000 mg by mouth 2 (two) times daily. Prior to dental appointments     aspirin EC 81 MG EC tablet Take 1 tablet (81 mg total) by mouth daily. Swallow whole. 30 tablet 11   atorvastatin (LIPITOR) 20 MG tablet TAKE ONE TABLET BY MOUTH EVERYDAY AT BEDTIME 90 tablet 2   Blood Glucose Monitoring Suppl (ACCU-CHEK GUIDE) w/Device KIT 1 Device by Does not apply route 3 (three) times daily. 1 kit 0   cetirizine (ZYRTEC) 10 MG chewable tablet Chew 10 mg by mouth daily.     Cholecalciferol (VITAMIN D3) 50 MCG (2000 UT) capsule Take 2,000 Units by mouth daily.     Continuous Blood Gluc Receiver (FREESTYLE LIBRE 2 READER) DEVI Use as instructed to check blood sugar. 1 each 0   Continuous Blood Gluc Sensor (FREESTYLE LIBRE 2 SENSOR) MISC Change  sensors every 14 days 6 each 3   Eplontersen Sodium (WAINUA) 45 MG/0.8ML SOAJ Inject 45 mg into the skin every 30 (thirty) days. 0.8 mL 12   FARXIGA 10 MG TABS tablet TAKE ONE TABLET BY MOUTH ONCE DAILY 90 tablet 3   Fluocinolone Acetonide 0.01 % OIL instill one drop in ears every 2-3 DAYS AS NEEDED FOR pruritis 20 mL 1   gabapentin (NEURONTIN) 600 MG tablet TAKE ONE TABLET BY MOUTH EVERY MORNING and TAKE ONE TABLET BY MOUTH EVERYDAY AT BEDTIME 60 tablet 3   glucose blood (ACCU-CHEK GUIDE) test strip Use to test blood sugar up to 3 times daily 100 each 3   insulin glargine, 1 Unit Dial, (TOUJEO SOLOSTAR) 300 UNIT/ML Solostar Pen Inject 22 Units into the skin daily. Eat a snack with protein nightly before bedtime. 15 mL 6   insulin lispro (HUMALOG KWIKPEN) 100 UNIT/ML KwikPen Max daily 30 units 30 mL 3   Insulin Pen Needle 32G X 4 MM MISC 1 Device by Does not apply route in the morning, at noon, in the evening, and at bedtime. 400 each 3   ketoconazole (NIZORAL) 2 % shampoo Apply 1 application. topically 2 (two) times a week. 120 mL 2   Lancets Misc. (ACCU-CHEK FASTCLIX LANCET) KIT Use to test blood sugar up to 3 times daily 1 kit 1   levothyroxine (SYNTHROID) 150  MCG tablet TAKE ONE TABLET BY MOUTH BEFORE BREAKFAST 90 tablet 3   magnesium oxide (MAG-OX) 400 MG tablet TAKE ONE TABLET BY MOUTH EVERY MORNING 90 tablet 1   metoprolol succinate (TOPROL-XL) 25 MG 24 hr tablet TAKE ONE TABLET BY MOUTH ONCE DAILY with OR immedaitely following A meal 90 tablet 3   Multiple Vitamin (MULTIVITAMIN) tablet Take 1 tablet by mouth daily. K FREE DAILY once daily (  MVI with no vitamin K)     nitroGLYCERIN (NITROSTAT) 0.4 MG SL tablet Dissolve 1 tab under tongue as needed for chest pain. May repeat every 5 minutes x 2 doses. If no relief call 9-1-1. 75 tablet 2   pantoprazole (PROTONIX) 20 MG tablet TAKE ONE TABLET BY MOUTH ONCE DAILY 30 tablet 2   Polyethylene Glycol 3350 (MIRALAX PO) Take 1 Package by mouth daily as needed (constipation).     potassium chloride SA (KLOR-CON M) 20 MEQ tablet TAKE ONE TABLET BY MOUTH ONCE DAILY 90 tablet 1   Propylene Glycol (SYSTANE BALANCE OP) Place 1 drop into both eyes daily as needed (for dry eyes).     SALINE MIST SPRAY NA Place 2 sprays into the nose at bedtime.     Tafamidis 61 MG CAPS Take 1 capsule (61 mg total) by mouth daily. 30 capsule 12   tirzepatide (MOUNJARO) 2.5 MG/0.5ML Pen Inject 2.5 mg into the skin once a week. 6 mL 3   torsemide (DEMADEX) 20 MG tablet TAKE TWO TABLETS BY MOUTH ONCE DAILY 180 tablet 1   trolamine salicylate (ASPERCREME) 10 % cream Apply 1 application. topically as needed for muscle pain.     warfarin (COUMADIN) 5 MG tablet TAKE 1/2 TABLET BY MOUTH daily OR as directed by coumadin clinic 50 tablet 1   No current facility-administered medications for this encounter.    Allergies  Allergen Reactions   Ace Inhibitors Other (See Comments)    unknown   Amlodipine Other (See Comments)    unknown   Atenolol Other (See Comments)    bradycardia   Avandia [Rosiglitazone] Other (See Comments)    unknown   Darvon [Propoxyphene] Other (See Comments)    unknown   Erythromycin Itching   Hydralazine Other  (See Comments)    Burning in throat and chest   Hydrocodone Other (See Comments)    Hallucinations.   Levofloxacin Itching   Morphine And Related Other (See Comments)    Dizzy and hallucianation, vomiting; Willing to try low dose   Percocet [Oxycodone-Acetaminophen] Other (See Comments)    hallucination   Spironolactone Other (See Comments)    unknown   Tramadol Other (See Comments)    Unknown/does not recall reaction but does not want to take again      Social History   Socioeconomic History   Marital status: Widowed    Spouse name: Not on file   Number of children: Not on file   Years of education: Not on file   Highest education level: Not on file  Occupational History   Occupation: Retired  Tobacco Use   Smoking status: Never   Smokeless tobacco: Never  Vaping Use   Vaping Use: Never used  Substance and Sexual Activity   Alcohol use: No   Drug use: Never   Sexual activity: Not Currently  Other Topics Concern   Not on file  Social History Narrative   Lives with daughter   Social Determinants of Health   Financial Resource Strain: Low Risk  (02/20/2023)   Overall Financial Resource Strain (CARDIA)    Difficulty of Paying Living Expenses: Not hard at all  Food Insecurity: No Food Insecurity (02/20/2023)   Hunger Vital Sign    Worried About Running Out of Food in the Last Year: Never true    Ran Out of Food in the Last Year: Never true  Transportation Needs: No Transportation Needs (02/20/2023)   PRAPARE - Administrator, Civil Service (Medical):  No    Lack of Transportation (Non-Medical): No  Physical Activity: Inactive (02/20/2023)   Exercise Vital Sign    Days of Exercise per Week: 0 days    Minutes of Exercise per Session: 0 min  Stress: No Stress Concern Present (02/20/2023)   [employer] of Occupational Health - Occupational Stress Questionnaire    Feeling of Stress : Not at all  Social Connections: Moderately Integrated (02/20/2023)   Social  Connection and Isolation Panel [NHANES]    Frequency of Communication with Friends and Family: More than three times a week    Frequency of Social Gatherings with Friends and Family: More than three times a week    Attends Religious Services: More than 4 times per year    Active Member of [employer] or Organizations: Yes    Attends Banker Meetings: More than 4 times per year    Marital Status: Widowed  Intimate Partner Violence: Not At Risk (02/20/2023)   Humiliation, Afraid, Rape, and Kick questionnaire    Fear of Current or Ex-Partner: No    Emotionally Abused: No    Physically Abused: No    Sexually Abused: No      Family History  Problem Relation Age of Onset   Heart disease Mother    Hypertension Mother    Cancer Father    Alcoholism Father     Vitals:   02/21/23 1130  BP: 124/70  Pulse: 88  SpO2: 99%  Weight: 102.9 kg (226 lb 12.8 oz)   Wt Readings from Last 3 Encounters:  02/21/23 102.9 kg (226 lb 12.8 oz)  02/20/23 103 kg (227 lb)  02/10/23 103 kg (227 lb)     PHYSICAL EXAM: General:  Elderly obese woman HEENT: normal Neck: supple. JVP hard to see Carotids 2+ bilat; no bruits. No lymphadenopathy or thryomegaly appreciated. Cor: PMI nondisplaced. Irregular rate & rhythm. No rubs, gallops or murmurs. Lungs: clear Abdomen: obese soft, nontender, nondistended. No hepatosplenomegaly. No bruits or masses. Good bowel sounds. Extremities: no cyanosis, clubbing, rash, trace edema Neuro: alert & oriented x 3, cranial nerves grossly intact. moves all 4 extremities w/o difficulty. Affect pleasant.  ECG: AFL 105 Personally reviewed    ASSESSMENT & PLAN:  1. 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 ReDS 39% Volume  status very labile. Will give Furoscix to have at home to use on a PRN basis with Paramedicine support - Continue Tafamadis and eplonteresen - Continue Farxiga 10 - Continue Toprol - Will start Entresto 24/26 for HTN and HF. Need to watch renal function closely. Labs today and 1 week. - Has some confusion about her meds. Will engage Paramedicine support  2. CKD 4 - baseline Scr 2.0 - watch closely with Entresto   3. Coronary artery disease  -Nonobstructive CAD in the past.   -Currently on aspirin and Lipitor.  Can stop ASA with warfarin  - No s/s angina - followed by Dr. Flora Lipps   4. Permanent AF - rate mildly elevated here Continue warfarin - Previously on Eliquis and Xarelto but switch to warfarin due to persistence of LAA clot - Rate elevated here.  - Will place Zio today to assess adequacy of rate control. If rates  elevated, may be worth trial of DC-CV with amio support   5. Obesity  - on tirzepatide   6. 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 Over half that time spent discussing above.    Arvilla Meres, MD  12:03 PM

## 2023-02-21 NOTE — Progress Notes (Signed)
ReDS Vest / Clip - 02/21/23 1200       ReDS Vest / Clip   Station Marker B    Ruler Value 32.5    ReDS Value Range Moderate volume overload    ReDS Actual Value 39

## 2023-02-21 NOTE — Patient Instructions (Signed)
START Entresto 24/26 mg Twice daily  Labs done today, your results will be available in MyChart, we will contact you for abnormal readings.  Repeat blood work in a week   Your provider has recommended that  you wear a Zio Patch for 7 days.  This monitor will record your heart rhythm for our review.  IF you have any symptoms while wearing the monitor please press the button.  If you have any issues with the patch or you notice a red or orange light on it please call the company at [phone].  Once you remove the patch please mail it back to the company as soon as possible so we can get the results.  You have been referred to the Paramedicine, Eileen Stanford will call you to get this arranged.  Furoscix will be ordered and sent to your house for you. This will only be used under our direction.  Your physician recommends that you schedule a follow-up appointment in: 4 weeks  If you have any questions or concerns before your next appointment please send [place] a message through [place] or call our office at [phone].    TO LEAVE A MESSAGE FOR THE NURSE SELECT OPTION 2, PLEASE LEAVE A MESSAGE INCLUDING: YOUR NAME DATE OF BIRTH CALL BACK NUMBER REASON FOR CALL**this is important as we prioritize the call backs  YOU WILL RECEIVE A CALL BACK THE SAME DAY AS LONG AS YOU CALL BEFORE 4:00 PM  At the [facility], you and your health needs are our priority. As part of our continuing mission to provide you with exceptional heart care, we have created designated Provider Care Teams. These Care Teams include your primary Cardiologist (physician) and Advanced Practice Providers (APPs- Physician Assistants and Nurse Practitioners) who all work together to provide you with the care you need, when you need it.   You may see any of the following providers on your designated Care Team at your next follow up: Dr Arvilla Meres Dr Marca Ancona Dr. Marcos Eke, NP Robbie Lis, [place] [facility] [place], [place] Brynda Peon, NP Karle Plumber, PharmD   Please be sure to bring in all your medications bottles to every appointment.    Thank you for choosing [facility]

## 2023-02-22 ENCOUNTER — Other Ambulatory Visit (HOSPITAL_COMMUNITY): Payer: Self-pay

## 2023-02-22 ENCOUNTER — Other Ambulatory Visit: Payer: Self-pay

## 2023-02-22 ENCOUNTER — Telehealth (HOSPITAL_COMMUNITY): Payer: Self-pay

## 2023-02-22 MED ORDER — TORSEMIDE 20 MG PO TABS
ORAL_TABLET | ORAL | 3 refills | Status: DC
  Filled 2023-02-22: qty 45, 90d supply, fill #0
  Filled 2023-02-27: qty 90, 60d supply, fill #0

## 2023-02-22 NOTE — Telephone Encounter (Signed)
Disregard.  Error message.

## 2023-02-22 NOTE — Telephone Encounter (Signed)
Tabitha from [facility] called and stated a new authorization is needed for her PET scan. The previous one expired January 11, 2023.  Thanks!

## 2023-02-23 ENCOUNTER — Telehealth (HOSPITAL_COMMUNITY): Payer: Self-pay | Admitting: Licensed Clinical Social Worker

## 2023-02-23 ENCOUNTER — Ambulatory Visit: Payer: Medicare Other | Admitting: Physical Therapy

## 2023-02-23 NOTE — Telephone Encounter (Signed)
CSW called pt to discuss referral to paramedicine program- unable to reach- left VM requesting return call  Burna Sis, LCSW Clinical Social Worker [facility] Desk#: [phone] Cell#: [phone]

## 2023-02-24 ENCOUNTER — Telehealth (HOSPITAL_COMMUNITY): Payer: Self-pay | Admitting: Licensed Clinical Social Worker

## 2023-02-24 NOTE — Telephone Encounter (Signed)
H&V Care Navigation CSW Progress Note  Clinical Social Worker consulted to enroll pt in community paramedicine program.    Pt is agreeable to enrollment in program at this time.  SDOH Screenings   Food Insecurity: No Food Insecurity (02/20/2023)  Housing: Low Risk  (02/20/2023)  Transportation Needs: No Transportation Needs (02/20/2023)  Utilities: Not At Risk (02/20/2023)  Alcohol Screen: Low Risk  (02/20/2023)  Depression (PHQ2-9): Low Risk  (02/20/2023)  Financial Resource Strain: Low Risk  (02/20/2023)  Physical Activity: Inactive (02/20/2023)  Social Connections: Moderately Integrated (02/20/2023)  Stress: No Stress Concern Present (02/20/2023)  Tobacco Use: Low Risk  (02/21/2023)     Paramedicine Initial Assessment:  Housing:  In what kind of housing do you live? House/apt/trailer/shelter? house  Do you live with anyone? Adult son  Are you currently worried about losing your housing? no  Social:  Do you have any children? 3 children- son who she lives with and two daughters who also live nearby  Income:  What is your current source of income? $1,600 from retirement income  Insurance:  Are you currently insured? UHC Medicare  Transportation:  Do you have transportation to your medical appointments? Children often drive her to appts but this can be burdensome because they take off work to do so.  Pt was working on applying to [employer] but doesn't think she can sit around to the extent that she would have to if she was taking the bus.  In the past 12 months has lack of transportation kept you from medical appts or from getting medications?  In the past 12 months has lack of transportation kept you from meetings, work, or getting things you needed?   Daily Health Needs: Do you have a working scale at home?  yes  How do you manage your medications at home? Pharmacy pill pack and she fills own pill box for medications that dont come in packs as well as as manages her injections.  States she  feels confident in her ability to do these things.  Do you ever take your medications differently than prescribed? no  Do you have issues affording your medications? no  Do you have any concerns with mobility at home? Utilizes a can  Do you have a PCP? Kathie Rhodes [place]  Do you have any trouble reading or writing? No trouble reading but hands are very weak  Do you currently use tobacco products or have recently quit? no  Do you currently see any mental health providers? no  Are there any additional barriers you see to getting the care you need? no  CSW sending out referral to Paramedics for assignment and follow up  Burna Sis, LCSW Clinical Social Worker [facility] Desk#: [phone] Cell#: [phone]

## 2023-02-27 ENCOUNTER — Other Ambulatory Visit (HOSPITAL_COMMUNITY): Payer: Self-pay

## 2023-02-28 ENCOUNTER — Ambulatory Visit (HOSPITAL_COMMUNITY)
Admission: RE | Admit: 2023-02-28 | Discharge: 2023-02-28 | Disposition: A | Discharge status: Home / Self Care | Payer: Medicare Other | Source: Ambulatory Visit | Attending: Cardiology | Admitting: Cardiology

## 2023-02-28 ENCOUNTER — Other Ambulatory Visit (HOSPITAL_COMMUNITY): Payer: Self-pay

## 2023-02-28 DIAGNOSIS — I5022 Chronic systolic (congestive) heart failure: Secondary | ICD-10-CM | POA: Insufficient documentation

## 2023-02-28 LAB — BASIC METABOLIC PANEL
Anion gap: 10 (ref 5–15)
BUN: 48 mg/dL — ABNORMAL HIGH (ref 8–23)
CO2: 29 mmol/L (ref 22–32)
Calcium: 9.3 mg/dL (ref 8.9–10.3)
Chloride: 97 mmol/L — ABNORMAL LOW (ref 98–111)
Creatinine, Ser: 2 mg/dL — ABNORMAL HIGH (ref 0.44–1.00)
GFR, Estimated: 25 mL/min — ABNORMAL LOW (ref >60)
Glucose, Bld: 124 mg/dL — ABNORMAL HIGH (ref 70–99)
Potassium: 4.4 mmol/L (ref 3.5–5.1)
Sodium: 136 mmol/L (ref 135–145)

## 2023-02-28 LAB — BRAIN NATRIURETIC PEPTIDE: B Natriuretic Peptide: 818.8 pg/mL — ABNORMAL HIGH (ref 0.0–100.0)

## 2023-03-01 ENCOUNTER — Telehealth (HOSPITAL_COMMUNITY): Payer: Self-pay | Admitting: Cardiology

## 2023-03-01 ENCOUNTER — Other Ambulatory Visit (HOSPITAL_COMMUNITY): Payer: Self-pay | Admitting: Emergency Medicine

## 2023-03-01 ENCOUNTER — Telehealth (HOSPITAL_COMMUNITY): Payer: Self-pay | Admitting: Emergency Medicine

## 2023-03-01 NOTE — Telephone Encounter (Signed)
[facility] with para medicine called during patients home visit to clarify patients torsemide dose  -chart noted torsemide 40 daily -med list noted torsemide 40 daily alternating 20 mg daily -pt is currently taking 40 mg daily with additional 20 mg as needed   Labs done 03/01/23 Cr 2.00 BUN 48 K 4.4 BNP 818.8  HF OV 02/21/23 Volume stats mildly elevated per Dr Gala Romney    Please advise

## 2023-03-01 NOTE — Progress Notes (Signed)
SOCIAL/MEDICAL BARRIERS:  PHARMACY USED  [facility]   MED ISSUES:  AFFORDABILITY N/A  PT ASSIST APPS NEEDED None   PCP YES Regina [name]   INSURANCE  [facility] Medicare/Medicaid  SOURCE OF INCOME   TRANSPORTATION Family   FOOD INSECURITIES/NEEDS none FOOD STAMPS N/A  REVIEWED DIET/FLUID/SALT RESTRICTIONS YES  RENT/OWN HOME ISSUES Lives with her son  SOCIAL SUPPORT YES - 6 children all who participate in her care and wellbeing.  SAFETY/DOMESTIC ISSUES NONE  SUBSTANCE ABUSE NO  DAILY WEIGHTS YES  EDUCATE ON DISEASE PROCESS/SYMPTOMS/PURPOSES OF MEDS  Pt. Familiar w/ same.  Initial visit w/ Ms. Inclan today was very pleasant.  She lives with her son and attended to by him very well.  She is familiar w/ her disease process.  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  She says she has been dealing with her heart failure for about 3 years. She gets her meds from [facility] and they are in packets and she knows what all her meds are for and how and when to take them.  Upon reviewing her meds I had a question as to how she is to be taking her Torsemide.  She is currently taking 40mg . Daily in her pill packs and has an extra bottle of Torsemide.  It was unclear as to whether she is to take the extra Torsemide every other day or PRN.  I reached out to Triage w/ no answer on office phone or mobile phone.  Contacted Chantel for clarification and she advised she would reach out to a provider and message me later to confirm exact instructions. Reviewed pending appts w/ pt.  Discussed weekly paramedicine visits and next home visit scheduled 5/23 @ 10:00.    Beatrix Shipper, EMT-Paramedic [phone] 03/01/2023

## 2023-03-01 NOTE — Telephone Encounter (Signed)
Spoke with Regina [name] and set up an initial paramedicine visit for this afternoon @ 3:30 this afternoon.    Beatrix Shipper, EMT-Paramedic [phone] 03/01/2023  .

## 2023-03-02 DIAGNOSIS — I4892 Unspecified atrial flutter: Secondary | ICD-10-CM | POA: Diagnosis not present

## 2023-03-03 MED ORDER — TORSEMIDE 20 MG PO TABS: 40.0000 mg | ORAL_TABLET | Freq: Every day | ORAL | 3 refills | Status: DC

## 2023-03-03 NOTE — Telephone Encounter (Signed)
Pt aware   Message to [facility] with paraMedicine

## 2023-03-07 ENCOUNTER — Telehealth: Payer: Self-pay | Admitting: Cardiovascular Disease

## 2023-03-07 NOTE — Telephone Encounter (Signed)
Patient hair dresser states she thinks her hair is falling out due to the [place].  She has "bald spots" She is not sure when the spots started , 3-5 areas .  Hair dresser states it is a type of alopecia, and will grow back but that the medication is the cause.  She states puling up a link and showing patient. She states she wears a wig held in place natural.  She had gone to hairdresser for wash and condition. She had not been to see her in over a month, she used head and shoulders approx 3 weeks ago. Uses tea tree oil as well. .  Dr Kathie Rhodes did give her a shampoo to use but not used in a month before head and shoulders. No new issues with scalp, has always itched. Started the new medication in February.  She "thinks I had a bald spot (just one)on my head before taking the [facility]". Last injection was the 16th of May. Please advise

## 2023-03-07 NOTE — Telephone Encounter (Signed)
Pt c/o medication issue:  1. Name of Medication:   Eplontersen Sodium (WAINUA) 45 MG/0.8ML SOAJ   2. How are you currently taking this medication (dosage and times per day)?  Caller stated patient stated she is taking the medication at prescribed  3. Are you having a reaction (difficulty breathing--STAT)?   Hair falling out  4. What is your medication issue?   Caller wants to report patient reported an adverse event with this medication.  Caller stated patient reported this medication is making her hair fall out.  Caller notes she is on the phone all day, can leave message and she will call back.

## 2023-03-07 NOTE — Telephone Encounter (Signed)
Returned call to patient and made her aware- she verbalized understanding. Advised patient to call back to office with any issues, questions, or concerns. Patient verbalized understanding.    Hair loss is not listed as a common side effect. Sounds like could have been there before. Would continue for now.  Gerri Spore T. Flora Lipps, MD, [facility]  [facility] [address], [address] [place], [place] [address] [phone] 11:42 AM

## 2023-03-08 ENCOUNTER — Telehealth: Payer: Self-pay

## 2023-03-08 NOTE — Progress Notes (Signed)
Care Management & Coordination Services Pharmacy Team  Reason for Encounter: Medication coordination and delivery  Contacted patient to discuss medications and coordinate delivery from [facility]. Unsuccessful outreach. Left voicemail for patient to return call. Multiple calls  Cycle dispensing form sent to Delano Metz for review.   Last adherence delivery date:02/16/2023      Patient is due for next adherence delivery on: 03/20/2023  This delivery to include: Adherence Packaging  30 Days  Warfarin 5 mg tablet (easy top vials) use as directed Allopurinol 100 mg 1 tablet at breakfast Klor-Con 20 meq - 1 tablet at breakfast Protonix 20 mg - 1 tablet at breakfast Aspirin 81 mg - 1 tablet at breakfast Magnesium oxide 400 mg 1 tablet at breakfast Cetirizine 10 mg 1 tablet at breakfast (OTC med) Atorvastatin 20 mg 1 tablet at bedtime Farxiga 10 mg 1 tablet at breakfast Metoprolol succinate 25 mg 1 tablet at breakfast Gabapentin 600 mg tablet  1 tablet at breakfast and 1 tablet at bedtime Levothyroxine 150 mcg 1 tablet before breakfast Torsemide 20 mg take 2 tablets at breakfast  Patient declined the following medications this month: Humalog kwikpen 100 un - inject 30 units max dose daily Toujeo solostar 300 un - inject 22 units daily (next fill date 01/16/23) Nitrostat 0.4mg  - 1 tablet every 5 min as needed   Fluocinolone Acetonide 0.01 % OIL (ear drop) Ketoconazole (NIZORAL) 2 % shampoo Pen needles 32 g X 4 mm   Delivery scheduled for 03/20/2023. Unable to speak with patient to confirm date.   Any concerns about your medications?   How often do you forget or accidentally miss a dose?   Is patient in packaging Yes  If yes  What is the date on your next pill pack?  Any concerns or issues with your packaging?   Recent blood pressure readings are as follows:  Recent blood glucose readings are as follows:   Chart review: Recent office visits:  02/20/2023  Theresa Mulligan LPN - Encounter for Medicare annual wellness exam  02/10/2023 Betty [place] MD - Patient was seen for Chronic systolic congestive heart failure and additional concerns. Discontinue Benzonatate.   Recent consult visits:  02/09/2023 Micah Flesher PA (card) - Patient was seen for medication management and additional concerns. No medication changes.  Hospital visits:  Patient was seen at [facility] ED on 02/05/2023 (9 hours) due to Acute on chronic systolic congestive heart failure . Discharge date was 02/06/2023.    New?Medications Started at [facility] Discharge:?? None Medication Changes at Hospital Discharge: None Medications Discontinued at Hospital Discharge: None Medications that remain the same after Hospital Discharge:??  -All other medications will remain the same.    Medications: Outpatient Encounter Medications as of 03/08/2023  Medication Sig Note   acetaminophen (TYLENOL) 500 MG tablet Take 1,000 mg by mouth every 6 (six) hours as needed for mild pain.    Alcohol Swabs (B-D SINGLE USE SWABS REGULAR) PADS Use to test blood sugar up to 3 times daily    allopurinol (ZYLOPRIM) 100 MG tablet TAKE ONE TABLET BY MOUTH EVERY MORNING    amoxicillin (AMOXIL) 500 MG tablet Take 1,000 mg by mouth 2 (two) times daily. Prior to dental appointments    aspirin EC 81 MG EC tablet Take 1 tablet (81 mg total) by mouth daily. Swallow whole.    atorvastatin (LIPITOR) 20 MG tablet TAKE ONE TABLET BY MOUTH EVERYDAY AT BEDTIME    Blood Glucose Monitoring Suppl (ACCU-CHEK GUIDE) w/Device KIT 1 Device  by Does not apply route 3 (three) times daily.    cetirizine (ZYRTEC) 10 MG chewable tablet Chew 10 mg by mouth daily.    Cholecalciferol (VITAMIN D3) 50 MCG (2000 UT) capsule Take 2,000 Units by mouth daily.    Continuous Blood Gluc Receiver (FREESTYLE LIBRE 2 READER) DEVI Use as instructed to check blood sugar.    Continuous Blood Gluc Sensor (FREESTYLE LIBRE 2 SENSOR) MISC Change  sensors  every 14 days    Eplontersen Sodium (WAINUA) 45 MG/0.8ML SOAJ Inject 45 mg into the skin every 30 (thirty) days.    FARXIGA 10 MG TABS tablet Take 1 tablet (10 mg total) by mouth daily.    Fluocinolone Acetonide 0.01 % OIL instill one drop in ears every 2-3 DAYS AS NEEDED FOR pruritis    gabapentin (NEURONTIN) 600 MG tablet TAKE ONE TABLET BY MOUTH EVERY MORNING and TAKE ONE TABLET BY MOUTH EVERYDAY AT BEDTIME    glucose blood (ACCU-CHEK GUIDE) test strip Use to test blood sugar up to 3 times daily    insulin glargine, 1 Unit Dial, (TOUJEO SOLOSTAR) 300 UNIT/ML Solostar Pen Inject 22 Units into the skin daily. Eat a snack with protein nightly before bedtime.    insulin lispro (HUMALOG KWIKPEN) 100 UNIT/ML KwikPen Max daily 30 units    Insulin Pen Needle 32G X 4 MM MISC 1 Device by Does not apply route in the morning, at noon, in the evening, and at bedtime.    ketoconazole (NIZORAL) 2 % shampoo Apply 1 application. topically 2 (two) times a week.    Lancets Misc. (ACCU-CHEK FASTCLIX LANCET) KIT Use to test blood sugar up to 3 times daily    levothyroxine (SYNTHROID) 150 MCG tablet TAKE ONE TABLET BY MOUTH BEFORE BREAKFAST    magnesium oxide (MAG-OX) 400 MG tablet TAKE ONE TABLET BY MOUTH EVERY MORNING    metoprolol succinate (TOPROL-XL) 25 MG 24 hr tablet TAKE ONE TABLET BY MOUTH ONCE DAILY with OR immedaitely following A meal    Multiple Vitamin (MULTIVITAMIN) tablet Take 1 tablet by mouth daily. K FREE DAILY once daily (MVI with no vitamin K)    nitroGLYCERIN (NITROSTAT) 0.4 MG SL tablet Dissolve 1 tab under tongue as needed for chest pain. May repeat every 5 minutes x 2 doses. If no relief call 9-1-1.    pantoprazole (PROTONIX) 20 MG tablet TAKE ONE TABLET BY MOUTH ONCE DAILY    Polyethylene Glycol 3350 (MIRALAX PO) Take 1 Package by mouth daily as needed (constipation). 03/01/2023: prn   potassium chloride SA (KLOR-CON M) 20 MEQ tablet TAKE ONE TABLET BY MOUTH ONCE DAILY    Propylene Glycol  (SYSTANE BALANCE OP) Place 1 drop into both eyes daily as needed (for dry eyes).    sacubitril-valsartan (ENTRESTO) 24-26 MG Take 1 tablet by mouth 2 (two) times daily.    sacubitril-valsartan (ENTRESTO) 24-26 MG Take 1 tablet by mouth 2 (two) times daily.    SALINE MIST SPRAY NA Place 2 sprays into the nose at bedtime. (Patient not taking: Reported on 03/01/2023)    Tafamidis 61 MG CAPS Take 1 capsule (61 mg total) by mouth daily.    tirzepatide [facility]) 2.5 MG/0.5ML Pen Inject 2.5 mg into the skin once a week.    torsemide (DEMADEX) 20 MG tablet Take 2 tablets (40 mg total) by mouth daily. May take an additional 20 mg as needed for swelling    trolamine salicylate (ASPERCREME) 10 % cream Apply 1 application. topically as needed for muscle pain.  warfarin (COUMADIN) 5 MG tablet TAKE 1/2 TABLET BY MOUTH daily OR as directed by coumadin clinic    No facility-administered encounter medications on file as of 03/08/2023.   BP Readings from Last 3 Encounters:  03/01/23 110/70  02/21/23 124/70  02/10/23 124/80    Pulse Readings from Last 3 Encounters:  03/01/23 88  02/21/23 88  02/10/23 95    Lab Results  Component Value Date/Time   HGBA1C 7.7 (A) 01/31/2023 01:37 PM   HGBA1C 7.5 (A) 08/01/2022 01:34 PM   HGBA1C 7.9 (H) 08/11/2021 08:15 AM   HGBA1C 7.2 (H) 06/18/2021 11:10 AM   Lab Results  Component Value Date   CREATININE 2.00 (H) 02/28/2023   BUN 48 (H) 02/28/2023   GFR 24.47 (L) 08/13/2021   GFRNONAA 25 (L) 02/28/2023   GFRAA 26 10/20/2021   NA 136 02/28/2023   K 4.4 02/28/2023   CALCIUM 9.3 02/28/2023   CO2 29 02/28/2023   Inetta Fermo CMA  Clinical Pharmacist Assistant [phone]

## 2023-03-09 ENCOUNTER — Other Ambulatory Visit (HOSPITAL_COMMUNITY): Payer: Self-pay | Admitting: Emergency Medicine

## 2023-03-09 NOTE — Progress Notes (Signed)
Paramedicine Encounter    Patient ID: Regina [name], female    DOB: [birth year], 82 y.o.   MRN: [id number]   Complaints NONE  Assessment A&O x , skin W&D w/ good color.  Lung sounds clear throughout.  Mild edema to lower extremities non-pitting.   Compliance with meds 100%  Pill box filled [facility] packets  Refills needed NONE  Meds changes since last visit NONE    Social changes NONE   BP (!) 140/80 (BP Location: Left Arm, Patient Position: Sitting, Cuff Size: Normal)   Pulse 84   Resp 16   Wt 227 lb (103 kg)   SpO2 98%   BMI 41.52 kg/m  Weight yesterday- Not taken Last visit weight-225lb   Mr. Brantley reports to be feeling well today.  She does well in taking her meds correctly from her pill packs.  At time of visit this morning she had not yet taken her morning meds because she had just got up and hadn't had breakfast.  She has some minimal edema to her lower extremities non-pitting.  She has some puffiness to her hands - more so the left hand.  She denies chest pain or SOB.  Lung sounds clear throughout.  She says she might consider taking an extra Torsemide around lunch time. ACTION: Home visit completed  Bethanie Dicker [phone] 03/09/23  Patient Care Team: [place], [name], MD as PCP - General (Family Medicine) O'Neal, Ronnald Ramp, MD as PCP - Cardiology (Internal Medicine) Sherrill Raring, [facility] (Pharmacist)  Patient Active Problem List   Diagnosis Date Noted   Acquired thrombophilia (HCC) 02/10/2023   Multiple lipomas 10/04/2022   Dizziness, nonspecific 09/22/2022   Dysphagia 09/22/2022   Left shoulder pain 09/22/2022   Pain due to onychomycosis of toenail of left foot 02/11/2022   Chest pain 12/26/2021   Left atrial thrombus 12/25/2021   Wild-type transthyretin-related (ATTR) amyloidosis (HCC)    Lung nodules 12/13/2021   Polymyalgia rheumatica (HCC) 06/30/2021   Neck pain 06/18/2021   Gait abnormality 06/18/2021   Memory  loss 06/18/2021   Atherosclerosis of aorta (HCC) 06/09/2021   Type 2 diabetes mellitus with stage 3a chronic kidney disease, with long-term current use of insulin (HCC) 09/14/2020   Type 2 diabetes mellitus with retinopathy, with long-term current use of insulin (HCC) 09/14/2020   Diabetes mellitus (HCC) 09/14/2020   Type 2 diabetes mellitus with diabetic polyneuropathy, with long-term current use of insulin (HCC) 09/14/2020   Long term (current) use of anticoagulants 07/13/2020   Persistent atrial fibrillation (HCC)    Mixed hyperlipidemia    Acute on chronic diastolic CHF (congestive heart failure), NYHA class 3 (HCC)    Demand ischemia    Atrial fibrillation (HCC) 04/24/2020   Other fatigue 03/24/2020   Short of breath on exertion 03/24/2020   Congestive heart failure (HCC) 03/24/2020   Vitamin D deficiency 03/24/2020   Sleep apnea 05/21/2019   History of hepatitis C 05/21/2019   Insomnia 05/21/2019   CKD (chronic kidney disease) stage 4, GFR 15-29 ml/min (HCC) 05/21/2019   GERD (gastroesophageal reflux disease) 04/20/2018   Type 2 diabetes mellitus with diabetic neuropathy, unspecified (HCC) 12/19/2016   History of TIA (transient ischemic attack) 12/19/2016   Hyperlipidemia associated with type 2 diabetes mellitus (HCC) 12/19/2016   S/P total knee replacement using cement, right 03/18/2015   Non-obstructive CAD    Cardiovascular stress test abnormal    Pleuritic chest pain 01/16/2015   Acute renal failure superimposed on stage 3  chronic kidney disease (HCC) 01/16/2015   Obesity, Class III, BMI 40-49.9 (morbid obesity) (HCC) 01/16/2015   Essential hypertension 01/16/2015   Hypothyroidism 01/16/2015   OSA on CPAP 01/16/2015   DM type 2 (diabetes mellitus, type 2) (HCC) 01/16/2015   Coronary artery disease involving native coronary artery without angina pectoris 10/02/2014   Physical deconditioning 02/28/2013   Morbid obesity (HCC) 02/27/2013   Osteoarthritis 02/27/2013     Current Outpatient Medications:    acetaminophen (TYLENOL) 500 MG tablet, Take 1,000 mg by mouth every 6 (six) hours as needed for mild pain., Disp: , Rfl:    Alcohol Swabs (B-D SINGLE USE SWABS REGULAR) PADS, Use to test blood sugar up to 3 times daily, Disp: 100 each, Rfl: 3   allopurinol (ZYLOPRIM) 100 MG tablet, TAKE ONE TABLET BY MOUTH EVERY MORNING, Disp: 90 tablet, Rfl: 1   aspirin EC 81 MG EC tablet, Take 1 tablet (81 mg total) by mouth daily. Swallow whole., Disp: 30 tablet, Rfl: 11   atorvastatin (LIPITOR) 20 MG tablet, TAKE ONE TABLET BY MOUTH EVERYDAY AT BEDTIME, Disp: 90 tablet, Rfl: 3   Blood Glucose Monitoring Suppl (ACCU-CHEK GUIDE) w/Device KIT, 1 Device by Does not apply route 3 (three) times daily., Disp: 1 kit, Rfl: 0   cetirizine (ZYRTEC) 10 MG chewable tablet, Chew 10 mg by mouth daily., Disp: , Rfl:    Cholecalciferol (VITAMIN D3) 50 MCG (2000 UT) capsule, Take 2,000 Units by mouth daily., Disp: , Rfl:    Continuous Blood Gluc Receiver (FREESTYLE LIBRE 2 READER) DEVI, Use as instructed to check blood sugar., Disp: 1 each, Rfl: 0   Continuous Blood Gluc Sensor (FREESTYLE LIBRE 2 SENSOR) MISC, Change  sensors every 14 days, Disp: 6 each, Rfl: 3   E[place]ontersen Sodium (WAINUA) 45 MG/0.8ML SOAJ, Inject 45 mg into the skin every 30 (thirty) days., Disp: 0.8 mL, Rfl: 12   FARXIGA 10 MG TABS tablet, Take 1 tablet (10 mg total) by mouth daily., Disp: 90 tablet, Rfl: 3   Fluocinolone Acetonide 0.01 % OIL, instill one drop in ears every 2-3 DAYS AS NEEDED FOR pruritis, Disp: 20 mL, Rfl: 1   gabapentin (NEURONTIN) 600 MG tablet, TAKE ONE TABLET BY MOUTH EVERY MORNING and TAKE ONE TABLET BY MOUTH EVERYDAY AT BEDTIME, Disp: 60 tablet, Rfl: 3   glucose blood (ACCU-CHEK GUIDE) test strip, Use to test blood sugar up to 3 times daily, Disp: 100 each, Rfl: 3   insulin glargine, 1 Unit Dial, (TOUJEO SOLOSTAR) 300 UNIT/ML Solostar Pen, Inject 22 Units into the skin daily. Eat a snack with  protein nightly before bedtime., Disp: 15 mL, Rfl: 6   insulin lispro (HUMALOG KWIKPEN) 100 UNIT/ML KwikPen, Max daily 30 units, Disp: 30 mL, Rfl: 3   Insulin Pen Needle 32G X 4 MM MISC, 1 Device by Does not apply route in the morning, at noon, in the evening, and at bedtime., Disp: 400 each, Rfl: 3   ketoconazole (NIZORAL) 2 % shampoo, Apply 1 application. topically 2 (two) times a week., Disp: 120 mL, Rfl: 2   Lancets Misc. (ACCU-CHEK FASTCLIX LANCET) KIT, Use to test blood sugar up to 3 times daily, Disp: 1 kit, Rfl: 1   levothyroxine (SYNTHROID) 150 MCG tablet, TAKE ONE TABLET BY MOUTH BEFORE BREAKFAST, Disp: 90 tablet, Rfl: 3   magnesium oxide (MAG-OX) 400 MG tablet, TAKE ONE TABLET BY MOUTH EVERY MORNING, Disp: 90 tablet, Rfl: 1   metoprolol succinate (TOPROL-XL) 25 MG 24 hr tablet, TAKE ONE TABLET  BY MOUTH ONCE DAILY with OR immedaitely following A meal, Disp: 90 tablet, Rfl: 3   Multiple Vitamin (MULTIVITAMIN) tablet, Take 1 tablet by mouth daily. K FREE DAILY once daily (MVI with no vitamin K), Disp: , Rfl:    nitroGLYCERIN (NITROSTAT) 0.4 MG SL tablet, Dissolve 1 tab under tongue as needed for chest pain. May repeat every 5 minutes x 2 doses. If no relief call 9-1-1., Disp: 75 tablet, Rfl: 2   pantoprazole (PROTONIX) 20 MG tablet, TAKE ONE TABLET BY MOUTH ONCE DAILY, Disp: 30 tablet, Rfl: 2   Polyethylene Glycol 3350 (MIRALAX PO), Take 1 Package by mouth daily as needed (constipation)., Disp: , Rfl:    potassium chloride SA (KLOR-CON M) 20 MEQ tablet, TAKE ONE TABLET BY MOUTH ONCE DAILY, Disp: 90 tablet, Rfl: 1   Propylene Glycol (SYSTANE BALANCE OP), Place 1 drop into both eyes daily as needed (for dry eyes)., Disp: , Rfl:    sacubitril-valsartan (ENTRESTO) 24-26 MG, Take 1 tablet by mouth 2 (two) times daily., Disp: 90 tablet, Rfl: 3   Tafamidis 61 MG CAPS, Take 1 capsule (61 mg total) by mouth daily., Disp: 30 capsule, Rfl: 12   tirzepatide (MOUNJARO) 2.5 MG/0.5ML Pen, Inject 2.5 mg into  the skin once a week., Disp: 6 mL, Rfl: 3   torsemide (DEMADEX) 20 MG tablet, Take 2 tablets (40 mg total) by mouth daily. May take an additional 20 mg as needed for swelling, Disp: 180 tablet, Rfl: 3   trolamine salicylate (ASPERCREME) 10 % cream, Apply 1 application. topically as needed for muscle pain., Disp: , Rfl:    warfarin (COUMADIN) 5 MG tablet, TAKE 1/2 TABLET BY MOUTH daily OR as directed by coumadin clinic, Disp: 50 tablet, Rfl: 1   amoxicillin (AMOXIL) 500 MG tablet, Take 1,000 mg by mouth 2 (two) times daily. Prior to dental appointments (Patient not taking: Reported on 03/09/2023), Disp: , Rfl:    sacubitril-valsartan (ENTRESTO) 24-26 MG, Take 1 tablet by mouth 2 (two) times daily., Disp: 60 tablet, Rfl: 0   SALINE MIST SPRAY NA, Place 2 sprays into the nose at bedtime. (Patient not taking: Reported on 03/01/2023), Disp: , Rfl:  Allergies  Allergen Reactions   Ace Inhibitors Other (See Comments)    unknown   Amlodipine Other (See Comments)    unknown   Atenolol Other (See Comments)    bradycardia   Avandia [Rosiglitazone] Other (See Comments)    unknown   Darvon [Propoxyphene] Other (See Comments)    unknown   Erythromycin Itching   Hydralazine Other (See Comments)    Burning in throat and chest   Hydrocodone Other (See Comments)    Hallucinations.   Levofloxacin Itching   Morphine And Codeine Other (See Comments)    Dizzy and hallucianation, vomiting; Willing to try low dose   Percocet [Oxycodone-Acetaminophen] Other (See Comments)    hallucination   Spironolactone Other (See Comments)    unknown   Tramadol Other (See Comments)    Unknown/does not recall reaction but does not want to take again     Social History   Socioeconomic History   Marital status: Widowed    Spouse name: Not on file   Number of children: Not on file   Years of education: Not on file   Highest education level: Not on file  Occupational History   Occupation: Retired  Tobacco Use    Smoking status: Never   Smokeless tobacco: Never  Vaping Use   Vaping Use: Never used  Substance and Sexual Activity   Alcohol use: No   Drug use: Never   Sexual activity: Not Currently  Other Topics Concern   Not on file  Social History Narrative   Lives with daughter   Social Determinants of Health   Financial Resource Strain: Low Risk  (02/20/2023)   Overall Financial Resource Strain (CARDIA)    Difficulty of Paying Living Expenses: Not hard at all  Food Insecurity: No Food Insecurity (02/20/2023)   Hunger Vital Sign    Worried About Running Out of Food in the Last Year: Never true    Ran Out of Food in the Last Year: Never true  Transportation Needs: No Transportation Needs (02/20/2023)   PRAPARE - Administrator, Civil Service (Medical): No    Lack of Transportation (Non-Medical): No  Physical Activity: Inactive (02/20/2023)   Exercise Vital Sign    Days of Exercise per Week: 0 days    Minutes of Exercise per Session: 0 min  Stress: No Stress Concern Present (02/20/2023)   [employer] of Occupational Health - Occupational Stress Questionnaire    Feeling of Stress : Not at all  Social Connections: Moderately Integrated (02/20/2023)   Social Connection and Isolation Panel [NHANES]    Frequency of Communication with Friends and Family: More than three times a week    Frequency of Social Gatherings with Friends and Family: More than three times a week    Attends Religious Services: More than 4 times per year    Active Member of [employer] or Organizations: Yes    Attends Banker Meetings: More than 4 times per year    Marital Status: Widowed  Intimate Partner Violence: Not At Risk (02/20/2023)   Humiliation, Afraid, Rape, and Kick questionnaire    Fear of Current or Ex-Partner: No    Emotionally Abused: No    Physically Abused: No    Sexually Abused: No    Physical Exam      Future Appointments  Date Time Provider Department Center  03/17/2023  2:00  PM [facility] None  03/22/2023  2:00 PM Herring, Milas Kocher, RPH [facility] None  03/24/2023  3:00 PM [facility] PA/NP [facility] None  04/03/2023  3:40 PM O'Neal, Ronnald Ramp, MD [facility] None  04/27/2023 12:10 PM Shamleffer, Konrad Dolores, MD [facility] None  02/28/2024  9:30 AM [facility]AL [facility]NESS VISIT [facility]

## 2023-03-10 ENCOUNTER — Telehealth: Payer: Self-pay

## 2023-03-10 DIAGNOSIS — K219 Gastro-esophageal reflux disease without esophagitis: Secondary | ICD-10-CM

## 2023-03-10 MED ORDER — PANTOPRAZOLE SODIUM 20 MG PO TBEC
20.0000 mg | DELAYED_RELEASE_TABLET | Freq: Every day | ORAL | 2 refills | Status: DC
Start: 2023-03-10 — End: 2023-06-05

## 2023-03-10 NOTE — Telephone Encounter (Signed)
-----   Message from Sherrill Raring, [facility] sent at 03/10/2023 12:03 PM EDT ----- Regarding: Med Refil [facility] requesting a medication refill on behalf of patient for the following:  Pantoprazole 20mg  1 tab by mouth once daily  Pharmacy Info: [facility] - [place], [place] - [address]  Phone: [phone] Fax: [phone]   Thank you! Sherrill Raring Clinical Pharmacist [phone]

## 2023-03-16 ENCOUNTER — Telehealth (HOSPITAL_COMMUNITY): Payer: Self-pay | Admitting: Emergency Medicine

## 2023-03-16 ENCOUNTER — Other Ambulatory Visit (HOSPITAL_COMMUNITY): Payer: Self-pay | Admitting: Emergency Medicine

## 2023-03-16 NOTE — Telephone Encounter (Signed)
Text me to let me know she did decide to take an extra 20mg  Torsemide @ 17:30.     Beatrix Shipper, EMT-Paramedic [phone] 03/16/2023

## 2023-03-16 NOTE — Progress Notes (Signed)
Paramedicine Encounter    Patient ID: Regina [name], female    DOB: 08-Jul-1941, 82 y.o.   MRN: [id number]   Complaints NONE  Assessment A&O x 4, skin W&D w/ good color.  Lung sounds clear and equal bilat.  Minimal edema to ankles with tenderness to palpation 5/10 on pain scale  Compliance with meds 100%  Pill box filled Upstream pill packs  Refills needed  NONE  Meds changes since last visit NONE    Social changes NONE   BP (!) 140/78 (BP Location: Right Arm, Patient Position: Sitting, Cuff Size: Large)   Resp 14   Wt 225 lb (102.1 kg)   SpO2 97%   BMI 41.15 kg/m  Weight yesterday-226lb Last visit weight-227lb  ATF Regina [name] A&O x 4, skin W&D w/ good color.  Pt. Denies chest pain, SOB or dizziness.  She does have some minimal edema to her ankles with tenderness upon palpation.  She states it seems to be a chronic discomfort and feels it could be associated with fluid retention.  On 5/28 she states around midnight she describes a "heavy, full sensation"  She took a 20mg . Torsemide at that time and says she felt relief from same. Pt had also previously taken an extra 20mg . Torsemide on 5/23 w/ relief from "feeling full and heavy".   [facility] 03/24/23  ACTION: Home visit completed  Regina [name] [phone] 03/16/23  Patient Care Team: [place], [name], MD as PCP - General (Family Medicine) O'Neal, Ronnald Ramp, MD as PCP - Cardiology (Internal Medicine) Sherrill Raring, [facility] (Pharmacist)  Patient Active Problem List   Diagnosis Date Noted   Acquired thrombophilia (HCC) 02/10/2023   Multiple lipomas 10/04/2022   Dizziness, nonspecific 09/22/2022   Dysphagia 09/22/2022   Left shoulder pain 09/22/2022   Pain due to onychomycosis of toenail of left foot 02/11/2022   Chest pain 12/26/2021   Left atrial thrombus 12/25/2021   Wild-type transthyretin-related (ATTR) amyloidosis (HCC)    Lung nodules 12/13/2021    Polymyalgia rheumatica (HCC) 06/30/2021   Neck pain 06/18/2021   Gait abnormality 06/18/2021   Memory loss 06/18/2021   Atherosclerosis of aorta (HCC) 06/09/2021   Type 2 diabetes mellitus with stage 3a chronic kidney disease, with long-term current use of insulin (HCC) 09/14/2020   Type 2 diabetes mellitus with retinopathy, with long-term current use of insulin (HCC) 09/14/2020   Diabetes mellitus (HCC) 09/14/2020   Type 2 diabetes mellitus with diabetic polyneuropathy, with long-term current use of insulin (HCC) 09/14/2020   Long term (current) use of anticoagulants 07/13/2020   Persistent atrial fibrillation (HCC)    Mixed hyperlipidemia    Acute on chronic diastolic CHF (congestive heart failure), NYHA class 3 (HCC)    Demand ischemia    Atrial fibrillation (HCC) 04/24/2020   Other fatigue 03/24/2020   Short of breath on exertion 03/24/2020   Congestive heart failure (HCC) 03/24/2020   Vitamin D deficiency 03/24/2020   Sleep apnea 05/21/2019   History of hepatitis C 05/21/2019   Insomnia 05/21/2019   CKD (chronic kidney disease) stage 4, GFR 15-29 ml/min (HCC) 05/21/2019   GERD (gastroesophageal reflux disease) 04/20/2018   Type 2 diabetes mellitus with diabetic neuropathy, unspecified (HCC) 12/19/2016   History of TIA (transient ischemic attack) 12/19/2016   Hyperlipidemia associated with type 2 diabetes mellitus (HCC) 12/19/2016   S/P total knee replacement using cement, right 03/18/2015   Non-obstructive CAD    Cardiovascular stress test  abnormal    Pleuritic chest pain 01/16/2015   Acute renal failure superimposed on stage 3 chronic kidney disease (HCC) 01/16/2015   Obesity, Class III, BMI 40-49.9 (morbid obesity) (HCC) 01/16/2015   Essential hypertension 01/16/2015   Hypothyroidism 01/16/2015   OSA on CPAP 01/16/2015   DM type 2 (diabetes mellitus, type 2) (HCC) 01/16/2015   Coronary artery disease involving native coronary artery without angina pectoris 10/02/2014    Physical deconditioning 02/28/2013   Morbid obesity (HCC) 02/27/2013   Osteoarthritis 02/27/2013    Current Outpatient Medications:    acetaminophen (TYLENOL) 500 MG tablet, Take 1,000 mg by mouth every 6 (six) hours as needed for mild pain., Disp: , Rfl:    Alcohol Swabs (B-D SINGLE USE SWABS REGULAR) PADS, Use to test blood sugar up to 3 times daily, Disp: 100 each, Rfl: 3   allopurinol (ZYLOPRIM) 100 MG tablet, TAKE ONE TABLET BY MOUTH EVERY MORNING, Disp: 90 tablet, Rfl: 1   amoxicillin (AMOXIL) 500 MG tablet, Take 1,000 mg by mouth 2 (two) times daily. Prior to dental appointments (Patient not taking: Reported on 03/09/2023), Disp: , Rfl:    aspirin EC 81 MG EC tablet, Take 1 tablet (81 mg total) by mouth daily. Swallow whole., Disp: 30 tablet, Rfl: 11   atorvastatin (LIPITOR) 20 MG tablet, TAKE ONE TABLET BY MOUTH EVERYDAY AT BEDTIME, Disp: 90 tablet, Rfl: 3   Blood Glucose Monitoring Suppl (ACCU-CHEK GUIDE) w/Device KIT, 1 Device by Does not apply route 3 (three) times daily., Disp: 1 kit, Rfl: 0   cetirizine (ZYRTEC) 10 MG chewable tablet, Chew 10 mg by mouth daily., Disp: , Rfl:    Cholecalciferol (VITAMIN D3) 50 MCG (2000 UT) capsule, Take 2,000 Units by mouth daily., Disp: , Rfl:    Continuous Blood Gluc Receiver (FREESTYLE LIBRE 2 READER) DEVI, Use as instructed to check blood sugar., Disp: 1 each, Rfl: 0   Continuous Blood Gluc Sensor (FREESTYLE LIBRE 2 SENSOR) MISC, Change  sensors every 14 days, Disp: 6 each, Rfl: 3   E[place]ontersen Sodium (WAINUA) 45 MG/0.8ML SOAJ, Inject 45 mg into the skin every 30 (thirty) days., Disp: 0.8 mL, Rfl: 12   FARXIGA 10 MG TABS tablet, Take 1 tablet (10 mg total) by mouth daily., Disp: 90 tablet, Rfl: 3   Fluocinolone Acetonide 0.01 % OIL, instill one drop in ears every 2-3 DAYS AS NEEDED FOR pruritis, Disp: 20 mL, Rfl: 1   gabapentin (NEURONTIN) 600 MG tablet, TAKE ONE TABLET BY MOUTH EVERY MORNING and TAKE ONE TABLET BY MOUTH EVERYDAY AT BEDTIME, Disp:  60 tablet, Rfl: 3   glucose blood (ACCU-CHEK GUIDE) test strip, Use to test blood sugar up to 3 times daily, Disp: 100 each, Rfl: 3   insulin glargine, 1 Unit Dial, (TOUJEO SOLOSTAR) 300 UNIT/ML Solostar Pen, Inject 22 Units into the skin daily. Eat a snack with protein nightly before bedtime., Disp: 15 mL, Rfl: 6   insulin lispro (HUMALOG KWIKPEN) 100 UNIT/ML KwikPen, Max daily 30 units, Disp: 30 mL, Rfl: 3   Insulin Pen Needle 32G X 4 MM MISC, 1 Device by Does not apply route in the morning, at noon, in the evening, and at bedtime., Disp: 400 each, Rfl: 3   ketoconazole (NIZORAL) 2 % shampoo, Apply 1 application. topically 2 (two) times a week., Disp: 120 mL, Rfl: 2   Lancets Misc. (ACCU-CHEK FASTCLIX LANCET) KIT, Use to test blood sugar up to 3 times daily, Disp: 1 kit, Rfl: 1   levothyroxine (SYNTHROID) 150  MCG tablet, TAKE ONE TABLET BY MOUTH BEFORE BREAKFAST, Disp: 90 tablet, Rfl: 3   magnesium oxide (MAG-OX) 400 MG tablet, TAKE ONE TABLET BY MOUTH EVERY MORNING, Disp: 90 tablet, Rfl: 1   metoprolol succinate (TOPROL-XL) 25 MG 24 hr tablet, TAKE ONE TABLET BY MOUTH ONCE DAILY with OR immedaitely following A meal, Disp: 90 tablet, Rfl: 3   Multiple Vitamin (MULTIVITAMIN) tablet, Take 1 tablet by mouth daily. K FREE DAILY once daily (MVI with no vitamin K), Disp: , Rfl:    nitroGLYCERIN (NITROSTAT) 0.4 MG SL tablet, Dissolve 1 tab under tongue as needed for chest pain. May repeat every 5 minutes x 2 doses. If no relief call 9-1-1., Disp: 75 tablet, Rfl: 2   pantoprazole (PROTONIX) 20 MG tablet, Take 1 tablet (20 mg total) by mouth daily., Disp: 30 tablet, Rfl: 2   Polyethylene Glycol 3350 (MIRALAX PO), Take 1 Package by mouth daily as needed (constipation)., Disp: , Rfl:    potassium chloride SA (KLOR-CON M) 20 MEQ tablet, TAKE ONE TABLET BY MOUTH ONCE DAILY, Disp: 90 tablet, Rfl: 1   Propylene Glycol (SYSTANE BALANCE OP), Place 1 drop into both eyes daily as needed (for dry eyes)., Disp: , Rfl:     sacubitril-valsartan (ENTRESTO) 24-26 MG, Take 1 tablet by mouth 2 (two) times daily., Disp: 90 tablet, Rfl: 3   sacubitril-valsartan (ENTRESTO) 24-26 MG, Take 1 tablet by mouth 2 (two) times daily., Disp: 60 tablet, Rfl: 0   SALINE MIST SPRAY NA, Place 2 sprays into the nose at bedtime. (Patient not taking: Reported on 03/01/2023), Disp: , Rfl:    Tafamidis 61 MG CAPS, Take 1 capsule (61 mg total) by mouth daily., Disp: 30 capsule, Rfl: 12   tirzepatide (MOUNJARO) 2.5 MG/0.5ML Pen, Inject 2.5 mg into the skin once a week., Disp: 6 mL, Rfl: 3   torsemide (DEMADEX) 20 MG tablet, Take 2 tablets (40 mg total) by mouth daily. May take an additional 20 mg as needed for swelling, Disp: 180 tablet, Rfl: 3   trolamine salicylate (ASPERCREME) 10 % cream, Apply 1 application. topically as needed for muscle pain., Disp: , Rfl:    warfarin (COUMADIN) 5 MG tablet, TAKE 1/2 TABLET BY MOUTH daily OR as directed by coumadin clinic, Disp: 50 tablet, Rfl: 1 Allergies  Allergen Reactions   Ace Inhibitors Other (See Comments)    unknown   Amlodipine Other (See Comments)    unknown   Atenolol Other (See Comments)    bradycardia   Avandia [Rosiglitazone] Other (See Comments)    unknown   Darvon [Propoxyphene] Other (See Comments)    unknown   Erythromycin Itching   Hydralazine Other (See Comments)    Burning in throat and chest   Hydrocodone Other (See Comments)    Hallucinations.   Levofloxacin Itching   Morphine And Codeine Other (See Comments)    Dizzy and hallucianation, vomiting; Willing to try low dose   Percocet [Oxycodone-Acetaminophen] Other (See Comments)    hallucination   Spironolactone Other (See Comments)    unknown   Tramadol Other (See Comments)    Unknown/does not recall reaction but does not want to take again     Social History   Socioeconomic History   Marital status: Widowed    Spouse name: Not on file   Number of children: Not on file   Years of education: Not on file    Highest education level: Not on file  Occupational History   Occupation: Retired  Tobacco Use  Smoking status: Never   Smokeless tobacco: Never  Vaping Use   Vaping Use: Never used  Substance and Sexual Activity   Alcohol use: No   Drug use: Never   Sexual activity: Not Currently  Other Topics Concern   Not on file  Social History Narrative   Lives with daughter   Social Determinants of Health   Financial Resource Strain: Low Risk  (02/20/2023)   Overall Financial Resource Strain (CARDIA)    Difficulty of Paying Living Expenses: Not hard at all  Food Insecurity: No Food Insecurity (02/20/2023)   Hunger Vital Sign    Worried About Running Out of Food in the Last Year: Never true    Ran Out of Food in the Last Year: Never true  Transportation Needs: No Transportation Needs (02/20/2023)   PRAPARE - Administrator, Civil Service (Medical): No    Lack of Transportation (Non-Medical): No  Physical Activity: Inactive (02/20/2023)   Exercise Vital Sign    Days of Exercise per Week: 0 days    Minutes of Exercise per Session: 0 min  Stress: No Stress Concern Present (02/20/2023)   [employer] of Occupational Health - Occupational Stress Questionnaire    Feeling of Stress : Not at all  Social Connections: Moderately Integrated (02/20/2023)   Social Connection and Isolation Panel [NHANES]    Frequency of Communication with Friends and Family: More than three times a week    Frequency of Social Gatherings with Friends and Family: More than three times a week    Attends Religious Services: More than 4 times per year    Active Member of [employer] or Organizations: Yes    Attends Banker Meetings: More than 4 times per year    Marital Status: Widowed  Intimate Partner Violence: Not At Risk (02/20/2023)   Humiliation, Afraid, Rape, and Kick questionnaire    Fear of Current or Ex-Partner: No    Emotionally Abused: No    Physically Abused: No    Sexually Abused: No     Physical Exam      Future Appointments  Date Time Provider Department Center  03/17/2023  2:00 PM [facility] None  03/22/2023  2:00 PM Herring, Milas Kocher, RPH [facility] None  03/24/2023  3:00 PM [facility] PA/NP [facility] None  04/03/2023  3:40 PM O'Neal, Ronnald Ramp, MD [facility] None  04/27/2023 12:10 PM Shamleffer, Konrad Dolores, MD [facility] None  02/28/2024  9:30 AM [facility]NESS VISIT [facility]

## 2023-03-17 ENCOUNTER — Ambulatory Visit: Payer: Medicare Other | Attending: Cardiovascular Disease

## 2023-03-17 ENCOUNTER — Other Ambulatory Visit (HOSPITAL_COMMUNITY): Payer: Self-pay | Admitting: Internal Medicine

## 2023-03-17 DIAGNOSIS — I4819 Other persistent atrial fibrillation: Secondary | ICD-10-CM | POA: Diagnosis not present

## 2023-03-17 DIAGNOSIS — Z7901 Long term (current) use of anticoagulants: Secondary | ICD-10-CM | POA: Diagnosis not present

## 2023-03-17 LAB — POCT INR: INR: 3 (ref 2.0–3.0)

## 2023-03-17 NOTE — Patient Instructions (Signed)
Description   Continue taking warfarin 1/2 tablet daily. Repeat INR in 6 weeks; Anticoagulation Clinic [phone]       

## 2023-03-20 NOTE — Progress Notes (Signed)
Care Management & Coordination Services Pharmacy Note  03/22/2023 Name:  Regina [name] MRN:  [id number] DOB:  November 28, 1940  Summary: -BP at goal <140/90 with home readings -Reports some swelling; following torsemide instructions prn swelling, sees cardio on 6/7 -A1C not at goal <7, has since started mounjaro   Recommendations/Changes made from today's visit: -Counseled to continue to check BP and weight daily, keep a log -Counseled on proper torsemide administration and signs/symptoms of fluid retention -Counseled on BG goals and how to correct sugar lows  Follow up plan: BP/DM call in 2 months Pharmacist visit in 3 months   Subjective: Regina [name] is an 82 y.o. year old female who is a primary patient of [place], Timoteo Expose, MD.  The care coordination team was consulted for assistance with disease management and care coordination needs.    Engaged with patient by telephone for follow up visit.  Recent office visits: 02/20/2023 Theresa Mulligan LPN - Encounter for Medicare annual wellness exam   02/10/2023 Betty [place] MD - Patient was seen for Chronic systolic congestive heart failure and additional concerns. Discontinue Benzonatate.   12/20/2022 Betty [place] MD - Patient was seen for skin lesion of left upper extremity and additional concerns. No medication changes.     11/11/22 Sigurd Sos, RN (Anticoag). INR 2.6, no medication changes  10/14/2022 Gershon Crane MD - Patient was seen for viral URI with cough and an additional concern. Started Benzonatate 200 mg q 6 hrs prn.    Recent consult visits: 03/17/23 Leary Roca, RN (Anticoag) INR 3.0. Continue with warfarin 1/2 tab daily. Repeat INR in 6 weeks  02/21/23 Arvilla Meres, MD (Cardio) - For established CHF, Start Sherryll Burger   02/09/2023 Micah Flesher PA (card) - Patient was seen for medication management and additional concerns. No medication changes.   01/31/23 Ibtehal Shamleffer MD(endo) - Patient was seen for Type  2 diabetes mellitus with diabetic neuropathy, with long-term current use of insulin and additional concerns. Started Tirzepatide 2.5 mg weekly   01/12/2023 Adewale Olalere MD(pulmonary) - Patient was seen for OSA on CPAP. No medication changes.    01/06/2023 Velna Ochs (cardiology) - Patient was seen for Hereditary cardiac amyloidosis and additional concerns.   12/16/2022 Verne Carrow MD (cardiology) - Patient was seen for acute on chronic systolic heart failure and additional concerns.Double torsemide as discuss for 3 days.   10/07/2022 Madelyn Brunner DO (sport med) - Patient was seen for chronic left shoulder pain and an additional concern. No medication changes.   Hospital visits: 02/05/23 [facility] - For acute on chronic systolic CHF, LOS 9 hours, No medication changes   Objective:  Lab Results  Component Value Date   CREATININE 2.00 (H) 02/28/2023   BUN 48 (H) 02/28/2023   GFR 24.47 (L) 08/13/2021   EGFR 25 (L) 02/10/2023   GFRNONAA 25 (L) 02/28/2023   GFRAA 26 10/20/2021   NA 136 02/28/2023   K 4.4 02/28/2023   CALCIUM 9.3 02/28/2023   CO2 29 02/28/2023   GLUCOSE 124 (H) 02/28/2023    Lab Results  Component Value Date/Time   HGBA1C 7.7 (A) 01/31/2023 01:37 PM   HGBA1C 7.5 (A) 08/01/2022 01:34 PM   HGBA1C 7.9 (H) 08/11/2021 08:15 AM   HGBA1C 7.2 (H) 06/18/2021 11:10 AM   FRUCTOSAMINE 325 (H) 09/09/2019 08:57 AM   FRUCTOSAMINE 349 (H) 05/22/2019 04:31 PM   GFR 24.47 (L) 08/13/2021 03:01 PM   GFR 20.67 (L) 02/15/2021 11:32 AM   MICROALBUR <0.7 08/13/2021 03:01 PM  MICROALBUR 1.9 05/22/2019 04:31 PM    Last diabetic Eye exam:  Lab Results  Component Value Date/Time   HMDIABEYEEXA No Retinopathy 05/18/2022 12:00 AM    Last diabetic Foot exam: No results found for: "HMDIABFOOTEX"   Lab Results  Component Value Date   CHOL 115 08/13/2021   HDL 54.80 08/13/2021   LDLCALC 40 08/13/2021   TRIG 101.0 08/13/2021   CHOLHDL 2 08/13/2021       Latest  Ref Rng & Units 05/17/2022   12:00 AM 10/20/2021   12:00 AM 04/25/2020    1:22 AM  Hepatic Function  Total Protein 6.5 - 8.1 g/dL   7.7   Albumin 3.5 - 5.0 3.9     4.1     3.6   AST 15 - 41 U/L   24   ALT 0 - 44 U/L   18   Alk Phosphatase 38 - 126 U/L   51   Total Bilirubin 0.3 - 1.2 mg/dL   0.7   Bilirubin, Direct 0.0 - 0.2 mg/dL   <9.1      This result is from an external source.    Lab Results  Component Value Date/Time   TSH 0.65 06/09/2021 11:06 AM   TSH 3.24 06/08/2020 08:35 AM       Latest Ref Rng & Units 02/05/2023    6:39 PM 08/31/2022   12:52 PM 05/17/2022   12:00 AM  CBC  WBC 4.0 - 10.5 K/uL 6.3  6.0    Hemoglobin 12.0 - 15.0 g/dL 47.8  29.5  62.1      Hematocrit 36.0 - 46.0 % 39.8  37.5    Platelets 150 - 400 K/uL 258  282.0       This result is from an external source.    Lab Results  Component Value Date/Time   VD25OH 55.3 03/24/2020 02:28 PM   VD25OH 59.65 05/22/2019 04:31 PM   VITAMINB12 810 06/18/2021 11:10 AM    Clinical ASCVD: Yes  The ASCVD Risk score (Arnett DK, et al., 2019) failed to calculate for the following reasons:   The patient has a prior MI or stroke diagnosis        02/20/2023   10:18 AM 12/20/2022   11:14 AM 09/19/2022   11:05 AM  Depression screen PHQ 2/9  Decreased Interest 0 0 0  Down, Depressed, Hopeless 0 0 0  PHQ - 2 Score 0 0 0     Social History   Tobacco Use  Smoking Status Never  Smokeless Tobacco Never   BP Readings from Last 3 Encounters:  03/22/23 106/72  03/16/23 118/70  03/09/23 (!) 140/80   Pulse Readings from Last 3 Encounters:  03/16/23 80  03/09/23 84  03/01/23 88   Wt Readings from Last 3 Encounters:  03/16/23 225 lb (102.1 kg)  03/09/23 227 lb (103 kg)  03/01/23 225 lb (102.1 kg)   BMI Readings from Last 3 Encounters:  03/16/23 41.15 kg/m  03/09/23 41.52 kg/m  03/01/23 41.15 kg/m    Allergies  Allergen Reactions   Ace Inhibitors Other (See Comments)    unknown   Amlodipine Other (See  Comments)    unknown   Atenolol Other (See Comments)    bradycardia   Avandia [Rosiglitazone] Other (See Comments)    unknown   Darvon [Propoxyphene] Other (See Comments)    unknown   Erythromycin Itching   Hydralazine Other (See Comments)    Burning in throat and chest   Hydrocodone Other (  See Comments)    Hallucinations.   Levofloxacin Itching   Morphine And Codeine Other (See Comments)    Dizzy and hallucianation, vomiting; Willing to try low dose   Percocet [Oxycodone-Acetaminophen] Other (See Comments)    hallucination   Spironolactone Other (See Comments)    unknown   Tramadol Other (See Comments)    Unknown/does not recall reaction but does not want to take again    Medications Reviewed Today     Reviewed by Sherrill Raring, RPH (Pharmacist) on 03/22/23 at 1409  Med List Status: <None>   Medication Order Taking? Sig Documenting Provider Last Dose Status Informant  acetaminophen (TYLENOL) 500 MG tablet [id number] Yes Take 1,000 mg by mouth every 6 (six) hours as needed for mild pain. [provider] Taking Active Self  Alcohol Swabs (B-D SINGLE USE SWABS REGULAR) PADS [id number]  Use to test blood sugar up to 3 times daily Shamleffer, Konrad Dolores, MD  Active Self  allopurinol (ZYLOPRIM) 100 MG tablet [id number] Yes TAKE ONE TABLET BY MOUTH EVERY MORNING [place], [name], MD Taking Active   amoxicillin (AMOXIL) 500 MG tablet [id number]  Take 1,000 mg by mouth 2 (two) times daily. Prior to dental appointments [provider]  Active   aspirin EC 81 MG EC tablet [id number]  Take 1 tablet (81 mg total) by mouth daily. Swallow whole. Pokhrel, Laxman, MD  Active Self  atorvastatin (LIPITOR) 20 MG tablet [id number]  TAKE ONE TABLET BY MOUTH EVERYDAY AT BEDTIME Bensimhon, Bevelyn Buckles, MD  Active   Blood Glucose Monitoring Suppl (ACCU-CHEK GUIDE) w/Device KIT [id number]  1 Device by Does not apply route 3 (three) times daily. Shamleffer, Konrad Dolores, MD  Active    cetirizine (ZYRTEC) 10 MG chewable tablet [id number]  Chew 10 mg by mouth daily. [provider]  Active   Cholecalciferol (VITAMIN D3) 50 MCG (2000 UT) capsule [id number]  Take 2,000 Units by mouth daily. [provider]  Active Self  Continuous Blood Gluc Receiver (FREESTYLE LIBRE 2 READER) DEVI [id number]  Use as instructed to check blood sugar. Shamleffer, Konrad Dolores, MD  Active   Continuous Blood Gluc Sensor (FREESTYLE LIBRE 2 SENSOR) [place] [id number]  Change  sensors every 14 days Shamleffer, Konrad Dolores, MD  Active   Eplontersen Sodium [facility]) 45 MG/0.8ML SOAJ [id number]  Inject 45 mg into the skin every 30 (thirty) days. Sande Rives, MD  Active   FARXIGA 10 MG TABS tablet [id number]  Take 1 tablet (10 mg total) by mouth daily. Bensimhon, Bevelyn Buckles, MD  Active   Fluocinolone Acetonide 0.01 % OIL [id number]  instill one drop in ears every 2-3 DAYS AS NEEDED FOR pruritis [place], [name], MD  Active   gabapentin (NEURONTIN) 600 MG tablet [id number]  TAKE ONE TABLET BY MOUTH EVERY MORNING and TAKE ONE TABLET BY MOUTH EVERYDAY AT BEDTIME [place], [name], MD  Active   glucose blood (ACCU-CHEK GUIDE) test strip [id number]  Use to test blood sugar up to 3 times daily Shamleffer, Konrad Dolores, MD  Active   insulin glargine, 1 Unit Dial, (TOUJEO SOLOSTAR) 300 UNIT/ML Solostar Pen [id number]  Inject 22 Units into the skin daily. Eat a snack with protein nightly before bedtime. Shamleffer, Konrad Dolores, MD  Active   insulin lispro (HUMALOG KWIKPEN) 100 UNIT/ML KwikPen [id number]  Max daily 30 units Shamleffer, Konrad Dolores, MD  Active   Insulin Pen Needle 32G X 4 MM MISC [id number]  1 Device by Does not apply route in the morning,  at noon, in the evening, and at bedtime. Shamleffer, Konrad Dolores, MD  Active   ketoconazole (NIZORAL) 2 % shampoo [id number]  Apply 1 application. topically 2 (two) times a week. [place], [name], MD  Active            Med Note Katrinka Blazing, DEDE    Thu Mar 16, 2023  9:58 AM) prn  Lancets Misc. (ACCU-CHEK FASTCLIX LANCET) KIT [id number]  Use to test blood sugar up to 3 times daily Shamleffer, Konrad Dolores, MD  Active   levothyroxine (SYNTHROID) 150 MCG tablet [id number]  TAKE ONE TABLET BY MOUTH BEFORE [place], [name], MD  Active   magnesium oxide (MAG-OX) 400 MG tablet [id number]  TAKE ONE TABLET BY MOUTH EVERY MORNING [place], [name], MD  Active   metoprolol succinate (TOPROL-XL) 25 MG 24 hr tablet [id number]  TAKE ONE TABLET BY MOUTH ONCE DAILY with OR immedaitely following A meal Bensimhon, Bevelyn Buckles, MD  Active   Multiple Vitamin (MULTIVITAMIN) tablet [id number]  Take 1 tablet by mouth daily. K FREE DAILY once daily (MVI with no vitamin K) [provider]  Active Self  nitroGLYCERIN (NITROSTAT) 0.4 MG SL tablet [id number]  Dissolve 1 tab under tongue as needed for chest pain. May repeat every 5 minutes x 2 doses. If no relief call 9-1-1. Sande Rives, MD  Active   pantoprazole (PROTONIX) 20 MG tablet [id number]  Take 1 tablet (20 mg total) by mouth daily. [place], [name], MD  Active   Polyethylene Glycol 3350 (MIRALAX PO) [id number]  Take 1 Package by mouth daily as needed (constipation). [provider]  Active Self           Med Note Katrinka Blazing, DEDE   Wed Mar 01, 2023  4:08 PM) prn  potassium chloride SA Jerene Dilling) 20 MEQ tablet [id number]  TAKE ONE TABLET BY MOUTH ONCE DAILY O'Neal, Ronnald Ramp, MD  Active   Propylene Glycol (SYSTANE BALANCE OP) [id number]  Place 1 drop into both eyes daily as needed (for dry eyes). [provider]  Active Self  sacubitril-valsartan (ENTRESTO) 24-26 MG [id number]  Take 1 tablet by mouth 2 (two) times daily. Bensimhon, Bevelyn Buckles, MD  Active   sacubitril-valsartan [facility]) 24-26 [place] [id number] Yes Take 1 tablet by mouth 2 (two) times daily. Bensimhon, Bevelyn Buckles, MD Taking Active   SALINE MIST SPRAY NA [id number]  Place 2 sprays into the nose at bedtime.  Patient not  taking: Reported on 03/01/2023   [provider]  Active Self  Tafamidis 61 MG CAPS [id number] Yes Take 1 capsule (61 mg total) by mouth daily. Sande Rives, MD Taking Active   tirzepatide [facility]) 2.5 MG/0.5ML Pen [id number] Yes Inject 2.5 mg into the skin once a week. Shamleffer, Konrad Dolores, MD Taking Active   torsemide (DEMADEX) 20 MG tablet [id number]  Take 2 tablets (40 mg total) by mouth daily. May take an additional 20 mg as needed for swelling Bensimhon, Bevelyn Buckles, MD  Active   trolamine salicylate (ASPERCREME) 10 % cream [id number]  Apply 1 application. topically as needed for muscle pain. [provider]  Active Self  warfarin (COUMADIN) 5 MG tablet [id number]  TAKE 1/2 TABLET BY MOUTH daily OR as directed by coumadin clinic O'Neal, Ronnald Ramp, MD  Active   Med List Note Gardner [facility]le [facility] 12/27/21 1157): `            SDOH:  (Social Determinants of Health) assessments and interventions performed: Yes SDOH Interventions  Flowsheet Row Care Coordination from 03/22/2023 in [facility] Telephone from 02/24/2023 in [facility] Clinical Support from 02/20/2023 in [facility] [place] Telephone from 02/10/2023 in [facility] Coordination Care Coordination from 11/30/2022 in [facility] Office Visit from 02/16/2022 in [facility] [place]  SDOH Interventions        Food Insecurity Interventions Intervention Not Indicated -- Intervention Not Indicated -- Intervention Not Indicated Intervention Not Indicated  Housing Interventions -- -- Intervention Not Indicated -- Intervention Not Indicated Intervention Not Indicated  Transportation Interventions -- Other (Comment)  [senior wheels] Intervention Not Indicated SCAT (Specialized Community Area [employer]poration), Other (Comment)  [[place]ied [employer] transportation application.]  Intervention Not Indicated Intervention Not Indicated  Utilities Interventions -- -- Intervention Not Indicated -- Intervention Not Indicated --  Alcohol Usage Interventions -- -- Intervention Not Indicated (Score <7) -- -- --  Financial Strain Interventions -- -- Intervention Not Indicated -- Intervention Not Indicated Intervention Not Indicated  Physical Activity Interventions -- -- Patient Refused, Other (Comments) -- -- Intervention Not Indicated  Stress Interventions -- -- Intervention Not Indicated -- -- Intervention Not Indicated  Social Connections Interventions -- -- Intervention Not Indicated -- -- Intervention Not Indicated       Medication Assistance: None required.  Patient affirms current coverage meets needs.  Medication Access: Within the past 30 days, how often has patient missed a dose of medication? None Is a pillbox or other method used to improve adherence? Yes  Factors that may affect medication adherence? no barriers identified Are meds synced by current pharmacy? Yes  Are meds delivered by current pharmacy? Yes  Does patient experience delays in picking up medications due to transportation concerns? No   Current pharmacy:  [facility] - [place], [place] - [address] [place] [address] Phone: [phone] Fax: [phone]  [facility] [place], [place] - [address] [place] [address] Phone: [phone] Fax: [phone]  [facility] [address] [place] [address] Phone: [phone] Fax: [phone]  [facility] [address] [place] - [address] [place] [address] Phone: [phone] Fax: [phone]   Compliance/Adherence/Medication fill history: Care Gaps: Urine microalbumin Vaccines: Tdap, Shingles, COVID  Star-Rating  Drugs: Atorvastatin 20mg  PDC 100% Farxiga 10mg  PDC 100% Entresto 24-26mg  PDC 100%   Assessment/Plan   Diabetes (A1c goal <7%) -Uncontrolled -Current medications: Farxiga 10mg  qd Appropriate, Effective, Safe, Accessible Toujeo 22 units into skin daily Appropriate, Effective, Safe, Accessible Humalog MDD 30 units daily Appropriate, Effective, Safe, Accessible Mounjaro 2.5mg  once weekly - on Mondays Appropriate, Query Effective -Medications previously tried: Ozempic  -Current home glucose readings Freestyle sensors, reports some lows at 69 that correct, follows SS for elevated sugars -Denies hypoglycemic/hyperglycemic symptoms -Current meal patterns:  drinks: water -Current exercise: None -Educated on A1c and blood sugar goals; Complications of diabetes including kidney damage, retinal damage, and cardiovascular disease; Benefits of routine self-monitoring of blood sugar; Continuous glucose monitoring; -Counseled to check feet daily and get yearly eye exams -Counseled on diet and exercise extensively Recommended to continue current medication  Heart Failure (Goal: manage symptoms and prevent exacerbations) -Controlled -Last ejection fraction: 40% (Date: 02/03/23) -HF type: HFmrEF (mildly reduced EF 41-49%) -NYHA Class: III (marked limitation of activity) -AHA HF Stage: C (Heart disease and  symptoms present) -Current treatment: Farxiga 10mg  1 qd Appropriate, Effective, Safe, Accessible Metoprolol XL 25mg  1 qd Appropriate, Effective, Safe, Accessible Entresto 24-26mg  1 BID Appropriate, Effective, Safe, Accessible Torsemide 20mg  2 tabs daily, may take additional tab in afternoon prn swelling -Medications previously tried: Lasix, HCTZ, Losartan -Current home BP/HR readings: 106/72  -Current home daily weights: checking daily, is taking an extra torsemide for more than 3lbs in 1 day or more than 5 lbs in 1 week and monitoring for improvement. Sees cardio on Friday -Current dietary  habits: mindful of salt intake -Current exercise habits: Not discussed -Educated on Benefits of medications for managing symptoms and prolonging life Importance of weighing daily; if you gain more than 3 pounds in one day or 5 pounds in one week, take extra torsemide as prescribed. If not resolving or feeling SOB, contact cardio or office asap Importance of blood pressure control -Counseled on diet and exercise extensively Recommended to continue current medication   Sherrill Raring Clinical Pharmacist [phone]

## 2023-03-21 ENCOUNTER — Telehealth: Payer: Self-pay

## 2023-03-21 NOTE — Progress Notes (Signed)
Care Management & Coordination Services Pharmacy Team  Reason for Encounter: Appointment Reminder  Contacted patient to confirm telephone appointment with Delano Metz, PharmD on 03/22/2023 at 2:00. Unsuccessful outreach. Left voicemail for patient to return call.   Care Gaps: AWV - scheduled 02/20/2023 Last eye exam - 05/18/2022 Last foot exam - 01/31/2023 Last BP - 118/70 on 03/16/2023 Last A1C - 7.5 on 08/01/2022 Tdap - never done Covid - overdue Shingrix - postponed   Star Rating Drugs: Atorvastatin 20mg  - last filled 05/30/202430 [facility] Farxiga 10mg  - last filled 03/16/2023 30 [facility] at [facility]  Inetta Fermo [facility]  Clinical Pharmacist Assistant [phone]

## 2023-03-22 ENCOUNTER — Ambulatory Visit: Payer: Medicare Other

## 2023-03-22 NOTE — Progress Notes (Signed)
ADVANCED HF CLINIC NOTE  Primary Care: [place], [name], MD Primary Cardiologist: Reatha Harps, MD HF Cardiologist: Dr. Gala Romney  HPI: Regina [name] is a 82 y.o.[place] woman with PMHx as below. Referred by Dr. Flora Lipps for further evaluation of he hATTR cardiac amyloidosis   Problem List 1.  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 Chronic AF -LAA sludge vs thrombus 05/05/2020  -persistent sludge 07/01/2020 -LAA thrombus persistent 08/05/2020 -on warfarin 3. DM -A1c 7.3 4. HTN 5. Non-obstructive CAD -LHC 01/19/2015 [facility] -50% mid RCA and 50% LAD -MPI 08/14/2020 -> normal 6. OSA 7. CKD 4 (b/l Scr ~2.0) 8. CVA -11/2020 @ [facility]  9. Polymyalgia Rheumatica  10. Morbid obesity  Currently on tafamadis (started 2021) and eplonteresen (started 2/24). Seen in f/u by Dr. Flora Lipps on 01/06/2023 with volume overload He increased her torsemide to 40/20  Echo repeated 02/03/2023 LVEF 40% with normal pressures.   She was seen in the ER 02/06/2023 for shortness of breath.  She was given IV Lasix with good result and discharged without admission.  She was seen by Bettina Gavia, PA on 02/09/23. Weight up 10 pounds despite mistakenly taking 120 mg torsemide daily. Labs with stable Scr 2.01  Initial visit with Dr. Gala Romney 5/24, NYHA III and volume up. Entresto started and given Furoscix for PRN home use with paramedicine support.  Today she returns for HF follow up with her daughter and [facility] (paramedicine). Overall feeling fair.  Not urinating as briskly on current dose of torsemide. She feels she has fluid on board, has been taking extra 20 mg torsemide for past few days. She has occasional chest discomfort, not with exertion.  She has ankle swelling. Denies palpitations, abnormal  bleeding, dizziness, or PND/Orthopnea. Appetite ok. No fever or chills. Weight at home 225 pounds. Taking all medications. Wears CPAP.  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 01/2015 Cardiolite: + inf wall ischemia, EF 56%;  b. 01/2015 Cath: LM nl, LAD 15m, LCX min irregs, RCA dominant, 50-65m.   Sleep apnea    Swallowing difficulty    Current Outpatient Medications  Medication Sig Dispense Refill   acetaminophen (TYLENOL) 500 MG tablet Take 1,000 mg by mouth every 6 (six) hours as needed for mild pain.     Alcohol Swabs (B-D SINGLE USE SWABS REGULAR) PADS Use to test blood sugar up to 3 times daily 100 each 3   allopurinol (ZYLOPRIM) 100 MG tablet TAKE ONE TABLET BY MOUTH EVERY MORNING 90 tablet  1   atorvastatin (LIPITOR) 20 MG tablet TAKE ONE TABLET BY MOUTH EVERYDAY AT BEDTIME 90 tablet 3   Blood Glucose Monitoring Suppl (ACCU-CHEK GUIDE) w/Device KIT 1 Device by Does not apply route 3 (three) times daily. 1 kit 0   cetirizine (ZYRTEC) 10 MG chewable tablet Chew 10 mg by mouth daily.     Cholecalciferol (VITAMIN D3) 50 MCG (2000 UT) capsule Take 2,000 Units by mouth daily.     Continuous Blood Gluc Receiver (FREESTYLE LIBRE 2 READER) DEVI Use as instructed to check blood sugar. 1 each 0   Continuous Blood Gluc Sensor (FREESTYLE LIBRE 2 SENSOR) MISC Change  sensors every 14 days 6 each 3   Eplontersen  Sodium (WAINUA) 45 MG/0.8ML SOAJ Inject 45 mg into the skin every 30 (thirty) days. 0.8 mL 12   FARXIGA 10 MG TABS tablet Take 1 tablet (10 mg total) by mouth daily. 90 tablet 3   Fluocinolone Acetonide 0.01 % OIL instill one drop in ears every 2-3 DAYS AS NEEDED FOR pruritis 20 mL 1   gabapentin (NEURONTIN) 600 MG tablet TAKE ONE TABLET BY MOUTH EVERY MORNING and TAKE ONE TABLET BY MOUTH EVERYDAY AT BEDTIME 60 tablet 3   glucose blood (ACCU-CHEK GUIDE) test strip Use to test blood sugar up to 3 times daily 100 each 3   insulin glargine, 1 Unit Dial, (TOUJEO SOLOSTAR) 300 UNIT/ML Solostar Pen Inject 22 Units into the skin daily. Eat a snack with protein nightly before bedtime. 15 mL 6   insulin lispro (HUMALOG KWIKPEN) 100 UNIT/ML KwikPen Max daily 30 units 30 mL 3   Insulin Pen Needle 32G X 4 MM MISC 1 Device by Does not apply route in the morning, at noon, in the evening, and at bedtime. 400 each 3   ketoconazole (NIZORAL) 2 % shampoo Apply 1 application. topically 2 (two) times a week. 120 mL 2   Lancets Misc. (ACCU-CHEK FASTCLIX LANCET) KIT Use to test blood sugar up to 3 times daily 1 kit 1   levothyroxine (SYNTHROID) 150 MCG tablet TAKE ONE TABLET BY MOUTH BEFORE BREAKFAST 90 tablet 3   magnesium oxide (MAG-OX) 400 MG tablet TAKE ONE TABLET BY MOUTH EVERY MORNING 90 tablet 1   metoprolol succinate (TOPROL-XL) 25 MG 24 hr tablet TAKE ONE TABLET BY MOUTH ONCE DAILY with OR immedaitely following A meal 90 tablet 3   Multiple Vitamin (MULTIVITAMIN) tablet Take 1 tablet by mouth daily. K FREE DAILY once daily (MVI with no vitamin K)     nitroGLYCERIN (NITROSTAT) 0.4 MG SL tablet Dissolve 1 tab under tongue as needed for chest pain. May repeat every 5 minutes x 2 doses. If no relief call 9-1-1. 75 tablet 2   pantoprazole (PROTONIX) 20 MG tablet Take 1 tablet (20 mg total) by mouth daily. 30 tablet 2   Polyethylene Glycol 3350 (MIRALAX PO) Take 1 Package by mouth daily as needed (constipation).      potassium chloride SA (KLOR-CON M) 20 MEQ tablet TAKE ONE TABLET BY MOUTH ONCE DAILY 90 tablet 1   Propylene Glycol (SYSTANE BALANCE OP) Place 1 drop into both eyes daily as needed (for dry eyes).     sacubitril-valsartan (ENTRESTO) 24-26 MG Take 1 tablet by mouth 2 (two) times daily. 90 tablet 3   sacubitril-valsartan (ENTRESTO) 24-26 MG Take 1 tablet by mouth 2 (two) times daily. 60 tablet 0   Tafamidis 61 MG CAPS Take 1 capsule (61 mg total) by mouth daily. 30 capsule 12  tirzepatide [facility]) 2.5 MG/0.5ML Pen Inject 2.5 mg into the skin once a week. 6 mL 3   torsemide (DEMADEX) 20 MG tablet Take 2 tablets (40 mg total) by mouth daily. May take an additional 20 mg as needed for swelling 180 tablet 3   trolamine salicylate (ASPERCREME) 10 % cream Apply 1 application. topically as needed for muscle pain.     warfarin (COUMADIN) 5 MG tablet TAKE 1/2 TABLET BY MOUTH daily OR as directed by coumadin clinic 50 tablet 1   No current facility-administered medications for this encounter.    Allergies  Allergen Reactions   Ace Inhibitors Other (See Comments)    unknown   Amlodipine Other (See Comments)    unknown   Atenolol Other (See Comments)    bradycardia   Avandia [Rosiglitazone] Other (See Comments)    unknown   Darvon [Propoxyphene] Other (See Comments)    unknown   Erythromycin Itching   Hydralazine Other (See Comments)    Burning in throat and chest   Hydrocodone Other (See Comments)    Hallucinations.   Levofloxacin Itching   Morphine And Codeine Other (See Comments)    Dizzy and hallucianation, vomiting; Willing to try low dose   Percocet [Oxycodone-Acetaminophen] Other (See Comments)    hallucination   Spironolactone Other (See Comments)    unknown   Tramadol Other (See Comments)    Unknown/does not recall reaction but does not want to take again   Social History   Socioeconomic History   Marital status: Widowed    Spouse name: Not on file   Number of children:  Not on file   Years of education: Not on file   Highest education level: Not on file  Occupational History   Occupation: Retired  Tobacco Use   Smoking status: Never   Smokeless tobacco: Never  Vaping Use   Vaping Use: Never used  Substance and Sexual Activity   Alcohol use: No   Drug use: Never   Sexual activity: Not Currently  Other Topics Concern   Not on file  Social History Narrative   Lives with daughter   Social Determinants of Health   Financial Resource Strain: Low Risk  (02/20/2023)   Overall Financial Resource Strain (CARDIA)    Difficulty of Paying Living Expenses: Not hard at all  Food Insecurity: No Food Insecurity (03/22/2023)   Hunger Vital Sign    Worried About Running Out of Food in the Last Year: Never true    Ran Out of Food in the Last Year: Never true  Transportation Needs: No Transportation Needs (02/20/2023)   PRAPARE - Administrator, Civil Service (Medical): No    Lack of Transportation (Non-Medical): No  Physical Activity: Inactive (02/20/2023)   Exercise Vital Sign    Days of Exercise per Week: 0 days    Minutes of Exercise per Session: 0 min  Stress: No Stress Concern Present (02/20/2023)   [employer] of Occupational Health - Occupational Stress Questionnaire    Feeling of Stress : Not at all  Social Connections: Moderately Integrated (02/20/2023)   Social Connection and Isolation Panel [NHANES]    Frequency of Communication with Friends and Family: More than three times a week    Frequency of Social Gatherings with Friends and Family: More than three times a week    Attends Religious Services: More than 4 times per year    Active Member of [employer] or Organizations: Yes    Attends Banker Meetings:  More than 4 times per year    Marital Status: Widowed  Intimate Partner Violence: Not At Risk (02/20/2023)   Humiliation, Afraid, Rape, and Kick questionnaire    Fear of Current or Ex-Partner: No    Emotionally Abused: No     Physically Abused: No    Sexually Abused: No   Family History  Problem Relation Age of Onset   Heart disease Mother    Hypertension Mother    Cancer Father    Alcoholism Father    BP 129/66   Pulse 95   Wt 103.9 kg (229 lb)   SpO2 99%   BMI 41.88 kg/m   Wt Readings from Last 3 Encounters:  03/24/23 103.9 kg (229 lb)  03/16/23 102.1 kg (225 lb)  03/09/23 103 kg (227 lb)    PHYSICAL EXAM: General:  NAD. No resp difficulty, walked into clinic HEENT: Normal Neck: Supple. JVP to jaw. Carotids 2+ bilat; no bruits. No lymphadenopathy or thryomegaly appreciated. Cor: PMI nondisplaced. Irregular rate & rhythm. No rubs, gallops or murmurs. Lungs: Clear, faint crackles RLL Abdomen: Soft, obese, nontender, nondistended. No hepatosplenomegaly. No bruits or masses. Good bowel sounds. Extremities: No cyanosis, clubbing, rash, 1+ BLE edema Neuro: Alert & oriented x 3, cranial nerves grossly intact. Moves all 4 extremities w/o difficulty. Affect pleasant.  ReDs: 40%  ASSESSMENT & PLAN: 1. 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 Volume up. ReDS 40% Volume status very labile.  - Use Furoscix 80 mg SQ + 40 KCL daily x 2 days (hold torsemide while using Furoscix) - Continue torsemide 40 mg daily + 20 KCL daily. - Continue Tafamadis and eplonteresen - Continue Farxiga 10 mg daily. - Continue Toprol XL 25 mg daily. - Continue Entresto 24/26 mg bid. Need to watch renal function closely.  - Continue Paramedicine support - Labs today. Repeat BMET and Mag in 7-10 days  2. CKD 4 - Baseline Scr 2.0 - Watch closely. - Labs today   3. Coronary artery disease  - Nonobstructive CAD in the past.   - Stop ASA as she is on  warfarin  - No s/s angina - Continue  statin - Followed by Dr. Flora Lipps   4. Permanent AF - Rate mildly elevated here Continue warfarin - Previously on Eliquis and Xarelto but switch to warfarin due to persistence of LAA clot - Zio 7 day (5/24) showed 100% AFL burden, HR 40-153 - If rates remain elevated, may be worth trial of DC-CV with amio support  - HR 95 today.  5. Obesity  - Body mass index is 41.88 kg/m. - on tirzepatide   6. 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  Jacklynn Ganong, FNP  3:52 PM   FUROSCIX prescribed  Patient viewed patient education video with QR code for Lenor Coffin code for FUROSCIX placed on AVS  Call FUROSCIX Direct at [phone] for questions regarding on body infuser.  Day 1  FUROSCIX 80 mg once daily  via on body infuser + KDUR 40   Day 2  FUROSCIX 80 mg once daily  via on body infuser+ KDUR 40  **  hold torsemide while using Furoscix

## 2023-03-23 ENCOUNTER — Other Ambulatory Visit (HOSPITAL_COMMUNITY): Payer: Self-pay

## 2023-03-24 ENCOUNTER — Ambulatory Visit (HOSPITAL_COMMUNITY)
Admission: RE | Admit: 2023-03-24 | Discharge: 2023-03-24 | Disposition: A | Discharge status: Home / Self Care | Payer: Medicare Other | Source: Ambulatory Visit | Attending: Family Medicine | Admitting: Family Medicine

## 2023-03-24 ENCOUNTER — Other Ambulatory Visit (HOSPITAL_COMMUNITY): Payer: Self-pay

## 2023-03-24 ENCOUNTER — Other Ambulatory Visit (HOSPITAL_COMMUNITY): Payer: Self-pay | Admitting: Emergency Medicine

## 2023-03-24 ENCOUNTER — Encounter (HOSPITAL_COMMUNITY): Payer: Self-pay

## 2023-03-24 VITALS — BP 129/66 | HR 95 | Wt 229.0 lb

## 2023-03-24 DIAGNOSIS — G4733 Obstructive sleep apnea (adult) (pediatric): Secondary | ICD-10-CM | POA: Diagnosis not present

## 2023-03-24 DIAGNOSIS — Z79899 Other long term (current) drug therapy: Secondary | ICD-10-CM | POA: Insufficient documentation

## 2023-03-24 DIAGNOSIS — I482 Chronic atrial fibrillation, unspecified: Secondary | ICD-10-CM | POA: Insufficient documentation

## 2023-03-24 DIAGNOSIS — Z8673 Personal history of transient ischemic attack (TIA), and cerebral infarction without residual deficits: Secondary | ICD-10-CM | POA: Diagnosis not present

## 2023-03-24 DIAGNOSIS — E039 Hypothyroidism, unspecified: Secondary | ICD-10-CM | POA: Insufficient documentation

## 2023-03-24 DIAGNOSIS — Z794 Long term (current) use of insulin: Secondary | ICD-10-CM | POA: Diagnosis not present

## 2023-03-24 DIAGNOSIS — I5022 Chronic systolic (congestive) heart failure: Secondary | ICD-10-CM | POA: Diagnosis not present

## 2023-03-24 DIAGNOSIS — Z7901 Long term (current) use of anticoagulants: Secondary | ICD-10-CM | POA: Insufficient documentation

## 2023-03-24 DIAGNOSIS — K219 Gastro-esophageal reflux disease without esophagitis: Secondary | ICD-10-CM | POA: Diagnosis not present

## 2023-03-24 DIAGNOSIS — E1140 Type 2 diabetes mellitus with diabetic neuropathy, unspecified: Secondary | ICD-10-CM | POA: Insufficient documentation

## 2023-03-24 DIAGNOSIS — I4821 Permanent atrial fibrillation: Secondary | ICD-10-CM | POA: Insufficient documentation

## 2023-03-24 DIAGNOSIS — M353 Polymyalgia rheumatica: Secondary | ICD-10-CM | POA: Insufficient documentation

## 2023-03-24 DIAGNOSIS — Z6841 Body Mass Index (BMI) 40.0 and over, adult: Secondary | ICD-10-CM | POA: Insufficient documentation

## 2023-03-24 DIAGNOSIS — I43 Cardiomyopathy in diseases classified elsewhere: Secondary | ICD-10-CM | POA: Diagnosis not present

## 2023-03-24 DIAGNOSIS — E1122 Type 2 diabetes mellitus with diabetic chronic kidney disease: Secondary | ICD-10-CM | POA: Insufficient documentation

## 2023-03-24 DIAGNOSIS — I2510 Atherosclerotic heart disease of native coronary artery without angina pectoris: Secondary | ICD-10-CM | POA: Insufficient documentation

## 2023-03-24 DIAGNOSIS — E854 Organ-limited amyloidosis: Secondary | ICD-10-CM | POA: Insufficient documentation

## 2023-03-24 DIAGNOSIS — I4891 Unspecified atrial fibrillation: Secondary | ICD-10-CM | POA: Diagnosis not present

## 2023-03-24 DIAGNOSIS — Z7984 Long term (current) use of oral hypoglycemic drugs: Secondary | ICD-10-CM | POA: Insufficient documentation

## 2023-03-24 DIAGNOSIS — N184 Chronic kidney disease, stage 4 (severe): Secondary | ICD-10-CM | POA: Insufficient documentation

## 2023-03-24 DIAGNOSIS — I130 Hypertensive heart and chronic kidney disease with heart failure and stage 1 through stage 4 chronic kidney disease, or unspecified chronic kidney disease: Secondary | ICD-10-CM | POA: Diagnosis not present

## 2023-03-24 DIAGNOSIS — I5042 Chronic combined systolic (congestive) and diastolic (congestive) heart failure: Secondary | ICD-10-CM | POA: Diagnosis not present

## 2023-03-24 LAB — BASIC METABOLIC PANEL
Anion gap: 12 (ref 5–15)
BUN: 54 mg/dL — ABNORMAL HIGH (ref 8–23)
CO2: 28 mmol/L (ref 22–32)
Calcium: 9.4 mg/dL (ref 8.9–10.3)
Chloride: 96 mmol/L — ABNORMAL LOW (ref 98–111)
Creatinine, Ser: 2.22 mg/dL — ABNORMAL HIGH (ref 0.44–1.00)
GFR, Estimated: 22 mL/min — ABNORMAL LOW (ref >60)
Glucose, Bld: 148 mg/dL — ABNORMAL HIGH (ref 70–99)
Potassium: 4.1 mmol/L (ref 3.5–5.1)
Sodium: 136 mmol/L (ref 135–145)

## 2023-03-24 LAB — BRAIN NATRIURETIC PEPTIDE: B Natriuretic Peptide: 899 pg/mL — ABNORMAL HIGH (ref 0.0–100.0)

## 2023-03-24 MED ORDER — POTASSIUM CHLORIDE CRYS ER 20 MEQ PO TBCR: 20.0000 meq | EXTENDED_RELEASE_TABLET | Freq: Every day | ORAL | 0 refills | Status: DC

## 2023-03-24 NOTE — Progress Notes (Unsigned)
Paramedicine Encounter   Patient ID: MORGEN LINEBAUGH , female,   DOB: [birth year],81 y.o.,  MRN: [id number]   Met patient in clinic today with provider.  Time spent with patient 1 hour.  Met w/ Ms. Mayo and [employer].  REDS clip reading 40.  Abdomen slightly distended.  Pt says she has a full sensation with historically for her she says that's how she feels when her fluid is up.    Med changes- pt advised to discontinue ASA.  Stop oral Torsemide dose for Sat & Sun and administer Furosix Sat & Sun w/ Potassium those 2 days.  Then resume her normal med regimen on Monday minus the ASA.  Pt. Advised she understands same.   Watched a short video on Furosix administration with pt and pt's daughter while in the clinic.  Pt and her daughter advise they understand the procedure.   Will follow up next week for results.     Beatrix Shipper, EMT-Paramedic  [phone] 03/24/2023

## 2023-03-24 NOTE — Patient Instructions (Addendum)
Thank you for coming in today  If you had labs drawn today, any labs that are abnormal the clinic will call you No news is good news  Medications: STOP aspirin Use Furocsix 03/25/2023 03/26/2023 and take 40 meq of Potassium...  hold Torsemide these 2 days.  You have been given a prescription for compression hose. And a list of places who supply them. Please wear your compression hose daily, place them on as soon as you get up in the morning and remove before you go to bed at night.    Follow up appointments:  Your physician recommends that you schedule a follow-up appointment in:  2 weeks in clinic    Do the following things EVERYDAY: Weigh yourself in the morning before breakfast. Write it down and keep it in a log. Take your medicines as prescribed Eat low salt foods--Limit salt (sodium) to 2000 mg per day.  Stay as active as you can everyday Limit all fluids for the day to less than 2 liters   At the [facility], you and your health needs are our priority. As part of our continuing mission to provide you with exceptional heart care, we have created designated Provider Care Teams. These Care Teams include your primary Cardiologist (physician) and Advanced Practice Providers (APPs- Physician Assistants and Nurse Practitioners) who all work together to provide you with the care you need, when you need it.   You may see any of the following providers on your designated Care Team at your next follow up: Dr Arvilla Meres Dr Marca Ancona Dr. Marcos Eke, NP Robbie Lis, [place] [facility] [place], [place] Brynda Peon, NP Karle Plumber, PharmD   Please be sure to bring in all your medications bottles to every appointment.    Thank you for choosing [facility]  If you have any questions or concerns before your next appointment please send [place] a message through [place] or call our office at  [phone].    TO LEAVE A MESSAGE FOR THE NURSE SELECT OPTION 2, PLEASE LEAVE A MESSAGE INCLUDING: YOUR NAME DATE OF BIRTH CALL BACK NUMBER REASON FOR CALL**this is important as we prioritize the call backs  YOU WILL RECEIVE A CALL BACK THE SAME DAY AS LONG AS YOU CALL BEFORE 4:00 PM

## 2023-03-28 ENCOUNTER — Other Ambulatory Visit (HOSPITAL_COMMUNITY): Payer: Self-pay | Admitting: Emergency Medicine

## 2023-03-28 ENCOUNTER — Other Ambulatory Visit (HOSPITAL_COMMUNITY): Payer: Self-pay

## 2023-03-28 NOTE — Progress Notes (Signed)
Paramedicine Encounter    Patient ID: Regina [name], female    DOB: 09/10/1941, 82 y.o.   MRN: [id number]   Complaints NONE  Assessment A&O x 4   Compliance with meds 100%  Pill box filled Uses pill packs from [facility]  Refills needed NONE  Meds changes since last visit did 2 days of Furosix Sat & Sun w/ Potassium.     Regina [name] reports to have completed her Furoscix Sat. & Sun.  She reports that her Saturday treatment "burned throughout the treatment"  Sunday's dose did not burn.  She advises this morning that she feels much better.  Weight 224.4lb today.  No chest pain, no SOB and no only mild edema to ankles only.  She did get her compression socks and is wearing them as she should.  Lung sounds clear throughout I advised her that I would be out of the office next week and instructed her to reach out to the clinic triage or reach out to [facility] or [place] w/ questions or needs.  If she has an emergency she knows she should call 911.   Social changes NONE   BP (!) 120/58 (BP Location: Left Arm, Patient Position: Sitting, Cuff Size: Normal)   Pulse 84   Wt 224 lb 6.4 oz (101.8 kg)   SpO2 97%   BMI 41.04 kg/m  Weight yesterday-225.5lb Last visit weight-225lb   Next visit 6/26 @10 :00 ACTION: Home visit completed  Regina [name] [phone] 03/28/23  Patient Care Team: [place], [name], MD as PCP - General (Family Medicine) O'Neal, Ronnald Ramp, MD as PCP - Cardiology (Internal Medicine) Sherrill Raring, [facility] (Pharmacist)  Patient Active Problem List   Diagnosis Date Noted   Acquired thrombophilia (HCC) 02/10/2023   Multiple lipomas 10/04/2022   Dizziness, nonspecific 09/22/2022   Dysphagia 09/22/2022   Left shoulder pain 09/22/2022   Pain due to onychomycosis of toenail of left foot 02/11/2022   Chest pain 12/26/2021   Left atrial thrombus 12/25/2021   Wild-type transthyretin-related (ATTR) amyloidosis (HCC)    Lung nodules 12/13/2021    Polymyalgia rheumatica (HCC) 06/30/2021   Neck pain 06/18/2021   Gait abnormality 06/18/2021   Memory loss 06/18/2021   Atherosclerosis of aorta (HCC) 06/09/2021   Type 2 diabetes mellitus with stage 3a chronic kidney disease, with long-term current use of insulin (HCC) 09/14/2020   Type 2 diabetes mellitus with retinopathy, with long-term current use of insulin (HCC) 09/14/2020   Diabetes mellitus (HCC) 09/14/2020   Type 2 diabetes mellitus with diabetic polyneuropathy, with long-term current use of insulin (HCC) 09/14/2020   Long term (current) use of anticoagulants 07/13/2020   Persistent atrial fibrillation (HCC)    Mixed hyperlipidemia    Acute on chronic diastolic CHF (congestive heart failure), NYHA class 3 (HCC)    Demand ischemia    Atrial fibrillation (HCC) 04/24/2020   Other fatigue 03/24/2020   Short of breath on exertion 03/24/2020   Congestive heart failure (HCC) 03/24/2020   Vitamin D deficiency 03/24/2020   Sleep apnea 05/21/2019   History of hepatitis C 05/21/2019   Insomnia 05/21/2019   CKD (chronic kidney disease) stage 4, GFR 15-29 ml/min (HCC) 05/21/2019   GERD (gastroesophageal reflux disease) 04/20/2018   Type 2 diabetes mellitus with diabetic neuropathy, unspecified (HCC) 12/19/2016   History of TIA (transient ischemic attack) 12/19/2016   Hyperlipidemia associated with type 2 diabetes mellitus (HCC) 12/19/2016   S/P total knee replacement using cement, right 03/18/2015   Non-obstructive CAD  Cardiovascular stress test abnormal    Pleuritic chest pain 01/16/2015   Acute renal failure superimposed on stage 3 chronic kidney disease (HCC) 01/16/2015   Obesity, Class III, BMI 40-49.9 (morbid obesity) (HCC) 01/16/2015   Essential hypertension 01/16/2015   Hypothyroidism 01/16/2015   OSA on CPAP 01/16/2015   DM type 2 (diabetes mellitus, type 2) (HCC) 01/16/2015   Coronary artery disease involving native coronary artery without angina pectoris 10/02/2014    Physical deconditioning 02/28/2013   Morbid obesity (HCC) 02/27/2013   Osteoarthritis 02/27/2013    Current Outpatient Medications:    acetaminophen (TYLENOL) 500 MG tablet, Take 1,000 mg by mouth every 6 (six) hours as needed for mild pain., Disp: , Rfl:    Alcohol Swabs (B-D SINGLE USE SWABS REGULAR) PADS, Use to test blood sugar up to 3 times daily, Disp: 100 each, Rfl: 3   allopurinol (ZYLOPRIM) 100 MG tablet, TAKE ONE TABLET BY MOUTH EVERY MORNING, Disp: 90 tablet, Rfl: 1   atorvastatin (LIPITOR) 20 MG tablet, TAKE ONE TABLET BY MOUTH EVERYDAY AT BEDTIME, Disp: 90 tablet, Rfl: 3   Blood Glucose Monitoring Suppl (ACCU-CHEK GUIDE) w/Device KIT, 1 Device by Does not apply route 3 (three) times daily., Disp: 1 kit, Rfl: 0   cetirizine (ZYRTEC) 10 MG chewable tablet, Chew 10 mg by mouth daily., Disp: , Rfl:    Cholecalciferol (VITAMIN D3) 50 MCG (2000 UT) capsule, Take 2,000 Units by mouth daily., Disp: , Rfl:    Continuous Blood Gluc Receiver (FREESTYLE LIBRE 2 READER) DEVI, Use as instructed to check blood sugar., Disp: 1 each, Rfl: 0   Continuous Blood Gluc Sensor (FREESTYLE LIBRE 2 SENSOR) MISC, Change  sensors every 14 days, Disp: 6 each, Rfl: 3   Eplontersen Sodium (WAINUA) 45 MG/0.8ML SOAJ, Inject 45 mg into the skin every 30 (thirty) days., Disp: 0.8 mL, Rfl: 12   FARXIGA 10 MG TABS tablet, Take 1 tablet (10 mg total) by mouth daily., Disp: 90 tablet, Rfl: 3   Fluocinolone Acetonide 0.01 % OIL, instill one drop in ears every 2-3 DAYS AS NEEDED FOR pruritis, Disp: 20 mL, Rfl: 1   gabapentin (NEURONTIN) 600 MG tablet, TAKE ONE TABLET BY MOUTH EVERY MORNING and TAKE ONE TABLET BY MOUTH EVERYDAY AT BEDTIME, Disp: 60 tablet, Rfl: 3   glucose blood (ACCU-CHEK GUIDE) test strip, Use to test blood sugar up to 3 times daily, Disp: 100 each, Rfl: 3   insulin glargine, 1 Unit Dial, (TOUJEO SOLOSTAR) 300 UNIT/ML Solostar Pen, Inject 22 Units into the skin daily. Eat a snack with protein nightly  before bedtime., Disp: 15 mL, Rfl: 6   insulin lispro (HUMALOG KWIKPEN) 100 UNIT/ML KwikPen, Max daily 30 units, Disp: 30 mL, Rfl: 3   Insulin Pen Needle 32G X 4 MM MISC, 1 Device by Does not apply route in the morning, at noon, in the evening, and at bedtime., Disp: 400 each, Rfl: 3   ketoconazole (NIZORAL) 2 % shampoo, Apply 1 application. topically 2 (two) times a week., Disp: 120 mL, Rfl: 2   Lancets Misc. (ACCU-CHEK FASTCLIX LANCET) KIT, Use to test blood sugar up to 3 times daily, Disp: 1 kit, Rfl: 1   levothyroxine (SYNTHROID) 150 MCG tablet, TAKE ONE TABLET BY MOUTH BEFORE BREAKFAST, Disp: 90 tablet, Rfl: 3   magnesium oxide (MAG-OX) 400 MG tablet, TAKE ONE TABLET BY MOUTH EVERY MORNING, Disp: 90 tablet, Rfl: 1   metoprolol succinate (TOPROL-XL) 25 MG 24 hr tablet, TAKE ONE TABLET BY MOUTH ONCE DAILY  with OR immedaitely following A meal, Disp: 90 tablet, Rfl: 3   Multiple Vitamin (MULTIVITAMIN) tablet, Take 1 tablet by mouth daily. K FREE DAILY once daily (MVI with no vitamin K), Disp: , Rfl:    nitroGLYCERIN (NITROSTAT) 0.4 MG SL tablet, Dissolve 1 tab under tongue as needed for chest pain. May repeat every 5 minutes x 2 doses. If no relief call 9-1-1., Disp: 75 tablet, Rfl: 2   pantoprazole (PROTONIX) 20 MG tablet, Take 1 tablet (20 mg total) by mouth daily., Disp: 30 tablet, Rfl: 2   Polyethylene Glycol 3350 (MIRALAX PO), Take 1 Package by mouth daily as needed (constipation)., Disp: , Rfl:    potassium chloride SA (KLOR-CON M) 20 MEQ tablet, Take 1 tablet (20 mEq total) by mouth daily., Disp: 30 tablet, Rfl: 0   Propylene Glycol (SYSTANE BALANCE OP), Place 1 drop into both eyes daily as needed (for dry eyes)., Disp: , Rfl:    sacubitril-valsartan (ENTRESTO) 24-26 MG, Take 1 tablet by mouth 2 (two) times daily., Disp: 90 tablet, Rfl: 3   sacubitril-valsartan (ENTRESTO) 24-26 MG, Take 1 tablet by mouth 2 (two) times daily., Disp: 60 tablet, Rfl: 0   Tafamidis 61 MG CAPS, Take 1 capsule (61  mg total) by mouth daily., Disp: 30 capsule, Rfl: 12   tirzepatide (MOUNJARO) 2.5 MG/0.5ML Pen, Inject 2.5 mg into the skin once a week., Disp: 6 mL, Rfl: 3   torsemide (DEMADEX) 20 MG tablet, Take 2 tablets (40 mg total) by mouth daily. May take an additional 20 mg as needed for swelling, Disp: 180 tablet, Rfl: 3   trolamine salicylate (ASPERCREME) 10 % cream, Apply 1 application. topically as needed for muscle pain., Disp: , Rfl:    warfarin (COUMADIN) 5 MG tablet, TAKE 1/2 TABLET BY MOUTH daily OR as directed by coumadin clinic, Disp: 50 tablet, Rfl: 1 Allergies  Allergen Reactions   Ace Inhibitors Other (See Comments)    unknown   Amlodipine Other (See Comments)    unknown   Atenolol Other (See Comments)    bradycardia   Avandia [Rosiglitazone] Other (See Comments)    unknown   Darvon [Propoxyphene] Other (See Comments)    unknown   Erythromycin Itching   Hydralazine Other (See Comments)    Burning in throat and chest   Hydrocodone Other (See Comments)    Hallucinations.   Levofloxacin Itching   Morphine And Codeine Other (See Comments)    Dizzy and hallucianation, vomiting; Willing to try low dose   Percocet [Oxycodone-Acetaminophen] Other (See Comments)    hallucination   Spironolactone Other (See Comments)    unknown   Tramadol Other (See Comments)    Unknown/does not recall reaction but does not want to take again     Social History   Socioeconomic History   Marital status: Widowed    Spouse name: Not on file   Number of children: Not on file   Years of education: Not on file   Highest education level: Not on file  Occupational History   Occupation: Retired  Tobacco Use   Smoking status: Never   Smokeless tobacco: Never  Vaping Use   Vaping Use: Never used  Substance and Sexual Activity   Alcohol use: No   Drug use: Never   Sexual activity: Not Currently  Other Topics Concern   Not on file  Social History Narrative   Lives with daughter   Social  Determinants of Health   Financial Resource Strain: Low Risk  (02/20/2023)  Overall Financial Resource Strain (CARDIA)    Difficulty of Paying Living Expenses: Not hard at all  Food Insecurity: No Food Insecurity (03/22/2023)   Hunger Vital Sign    Worried About Running Out of Food in the Last Year: Never true    Ran Out of Food in the Last Year: Never true  Transportation Needs: No Transportation Needs (02/20/2023)   PRAPARE - Administrator, Civil Service (Medical): No    Lack of Transportation (Non-Medical): No  Physical Activity: Inactive (02/20/2023)   Exercise Vital Sign    Days of Exercise per Week: 0 days    Minutes of Exercise per Session: 0 min  Stress: No Stress Concern Present (02/20/2023)   [employer] of Occupational Health - Occupational Stress Questionnaire    Feeling of Stress : Not at all  Social Connections: Moderately Integrated (02/20/2023)   Social Connection and Isolation Panel [NHANES]    Frequency of Communication with Friends and Family: More than three times a week    Frequency of Social Gatherings with Friends and Family: More than three times a week    Attends Religious Services: More than 4 times per year    Active Member of [employer] or Organizations: Yes    Attends Banker Meetings: More than 4 times per year    Marital Status: Widowed  Intimate Partner Violence: Not At Risk (02/20/2023)   Humiliation, Afraid, Rape, and Kick questionnaire    Fear of Current or Ex-Partner: No    Emotionally Abused: No    Physically Abused: No    Sexually Abused: No    Physical Exam      Future Appointments  Date Time Provider Department Center  03/31/2023  1:45 PM [facility] None  04/03/2023  3:40 PM O'Neal, Ronnald Ramp, MD [facility] None  04/11/2023  3:30 PM [facility] PA/NP [facility] None  04/27/2023 12:10 PM Shamleffer, Konrad Dolores, MD [facility] None  05/03/2023  3:15 PM [facility] None  06/21/2023   3:00 PM Herring, Milas Kocher, RPH [facility] None  02/28/2024  9:30 AM [facility]NUAL WELLNESS VISIT [facility]

## 2023-03-30 ENCOUNTER — Telehealth (HOSPITAL_COMMUNITY): Payer: Self-pay | Admitting: Emergency Medicine

## 2023-03-30 DIAGNOSIS — G4733 Obstructive sleep apnea (adult) (pediatric): Secondary | ICD-10-CM | POA: Diagnosis not present

## 2023-03-30 NOTE — Telephone Encounter (Signed)
Called Regina [name] to see how she's feeling today and she reports she feels "very good".  She says she's holding her weight @ 225lbs and BP is

## 2023-03-31 ENCOUNTER — Other Ambulatory Visit (HOSPITAL_COMMUNITY): Payer: Self-pay | Admitting: Emergency Medicine

## 2023-03-31 ENCOUNTER — Ambulatory Visit (HOSPITAL_COMMUNITY)
Admission: RE | Admit: 2023-03-31 | Discharge: 2023-03-31 | Disposition: A | Discharge status: Home / Self Care | Payer: Medicare Other | Source: Ambulatory Visit | Attending: Cardiology | Admitting: Cardiology

## 2023-03-31 DIAGNOSIS — I5032 Chronic diastolic (congestive) heart failure: Secondary | ICD-10-CM | POA: Diagnosis not present

## 2023-03-31 LAB — BASIC METABOLIC PANEL
Anion gap: 10 (ref 5–15)
BUN: 53 mg/dL — ABNORMAL HIGH (ref 8–23)
CO2: 28 mmol/L (ref 22–32)
Calcium: 9.1 mg/dL (ref 8.9–10.3)
Chloride: 97 mmol/L — ABNORMAL LOW (ref 98–111)
Creatinine, Ser: 2.18 mg/dL — ABNORMAL HIGH (ref 0.44–1.00)
GFR, Estimated: 22 mL/min — ABNORMAL LOW (ref >60)
Glucose, Bld: 140 mg/dL — ABNORMAL HIGH (ref 70–99)
Potassium: 3.9 mmol/L (ref 3.5–5.1)
Sodium: 135 mmol/L (ref 135–145)

## 2023-03-31 LAB — MAGNESIUM: Magnesium: 2.3 mg/dL (ref 1.7–2.4)

## 2023-03-31 NOTE — Progress Notes (Signed)
Paramedicine Encounter    Patient ID: Regina [name], female    DOB: 10-15-1941, 82 y.o.   MRN: [id number]   Complaints NONE  Assessment A&O x 4, skin W&D w/ good color.  Lung sounds clear and equal bilat.  Peripheral edema noted.    Compliance with meds 100%  Pill box filled n/a - pill packs from [facility]  Refills needed Entresto 24-26.  Will be delivered by [facility] this afternoon.   Meds changes since last visit discontinue 81mg . ASA.     Social changes NONE   BP 128/78 (BP Location: Right Arm, Patient Position: Sitting, Cuff Size: Normal)   Pulse 88   Resp 16   Wt 224 lb 4.8 oz (101.7 kg)   BMI 41.03 kg/m  Weight yesterday-225lb Last visit weight-224lb  Called [facility] to let them know that pt. Is no longer taking 81mg . ASA.  Also Entresto 24-26 to be delivered this afternoon and will be added to her pill packs at next pill pack delivery around 04/19/23.   She has a lab visit this afternoon and I advised her I would be on the lookout for any med changes needed. Let pt. Know that I will be out of the office next week and that Katie or [name] would have my cell phone should she have needs or questions.  Also reminded her that she could call [facility] Clinic triage if needed.  As always should an emergency arise reminded her she can always call 911.   ACTION: Home visit completed  Bethanie Dicker [phone] 03/31/23  Patient Care Team: [place], [name], MD as PCP - General (Family Medicine) O'Neal, Ronnald Ramp, MD as PCP - Cardiology (Internal Medicine) Sherrill Raring, [facility] (Pharmacist)  Patient Active Problem List   Diagnosis Date Noted   Acquired thrombophilia (HCC) 02/10/2023   Multiple lipomas 10/04/2022   Dizziness, nonspecific 09/22/2022   Dysphagia 09/22/2022   Left shoulder pain 09/22/2022   Pain due to onychomycosis of toenail of left foot 02/11/2022   Chest pain 12/26/2021   Left atrial thrombus 12/25/2021   Wild-type  transthyretin-related (ATTR) amyloidosis (HCC)    Lung nodules 12/13/2021   Polymyalgia rheumatica (HCC) 06/30/2021   Neck pain 06/18/2021   Gait abnormality 06/18/2021   Memory loss 06/18/2021   Atherosclerosis of aorta (HCC) 06/09/2021   Type 2 diabetes mellitus with stage 3a chronic kidney disease, with long-term current use of insulin (HCC) 09/14/2020   Type 2 diabetes mellitus with retinopathy, with long-term current use of insulin (HCC) 09/14/2020   Diabetes mellitus (HCC) 09/14/2020   Type 2 diabetes mellitus with diabetic polyneuropathy, with long-term current use of insulin (HCC) 09/14/2020   Long term (current) use of anticoagulants 07/13/2020   Persistent atrial fibrillation (HCC)    Mixed hyperlipidemia    Acute on chronic diastolic CHF (congestive heart failure), NYHA class 3 (HCC)    Demand ischemia    Atrial fibrillation (HCC) 04/24/2020   Other fatigue 03/24/2020   Short of breath on exertion 03/24/2020   Congestive heart failure (HCC) 03/24/2020   Vitamin D deficiency 03/24/2020   Sleep apnea 05/21/2019   History of hepatitis C 05/21/2019   Insomnia 05/21/2019   CKD (chronic kidney disease) stage 4, GFR 15-29 ml/min (HCC) 05/21/2019   GERD (gastroesophageal reflux disease) 04/20/2018   Type 2 diabetes mellitus with diabetic neuropathy, unspecified (HCC) 12/19/2016   History of TIA (transient ischemic attack) 12/19/2016   Hyperlipidemia associated with type 2 diabetes mellitus (HCC) 12/19/2016  S/P total knee replacement using cement, right 03/18/2015   Non-obstructive CAD    Cardiovascular stress test abnormal    Pleuritic chest pain 01/16/2015   Acute renal failure superimposed on stage 3 chronic kidney disease (HCC) 01/16/2015   Obesity, Class III, BMI 40-49.9 (morbid obesity) (HCC) 01/16/2015   Essential hypertension 01/16/2015   Hypothyroidism 01/16/2015   OSA on CPAP 01/16/2015   DM type 2 (diabetes mellitus, type 2) (HCC) 01/16/2015   Coronary artery  disease involving native coronary artery without angina pectoris 10/02/2014   Physical deconditioning 02/28/2013   Morbid obesity (HCC) 02/27/2013   Osteoarthritis 02/27/2013    Current Outpatient Medications:    acetaminophen (TYLENOL) 500 MG tablet, Take 1,000 mg by mouth every 6 (six) hours as needed for mild pain., Disp: , Rfl:    Alcohol Swabs (B-D SINGLE USE SWABS REGULAR) PADS, Use to test blood sugar up to 3 times daily, Disp: 100 each, Rfl: 3   allopurinol (ZYLOPRIM) 100 MG tablet, TAKE ONE TABLET BY MOUTH EVERY MORNING, Disp: 90 tablet, Rfl: 1   atorvastatin (LIPITOR) 20 MG tablet, TAKE ONE TABLET BY MOUTH EVERYDAY AT BEDTIME, Disp: 90 tablet, Rfl: 3   Blood Glucose Monitoring Suppl (ACCU-CHEK GUIDE) w/Device KIT, 1 Device by Does not apply route 3 (three) times daily., Disp: 1 kit, Rfl: 0   cetirizine (ZYRTEC) 10 MG chewable tablet, Chew 10 mg by mouth daily., Disp: , Rfl:    Cholecalciferol (VITAMIN D3) 50 MCG (2000 UT) capsule, Take 2,000 Units by mouth daily., Disp: , Rfl:    Continuous Blood Gluc Receiver (FREESTYLE LIBRE 2 READER) DEVI, Use as instructed to check blood sugar., Disp: 1 each, Rfl: 0   Continuous Blood Gluc Sensor (FREESTYLE LIBRE 2 SENSOR) MISC, Change  sensors every 14 days, Disp: 6 each, Rfl: 3   Eplontersen Sodium (WAINUA) 45 MG/0.8ML SOAJ, Inject 45 mg into the skin every 30 (thirty) days., Disp: 0.8 mL, Rfl: 12   FARXIGA 10 MG TABS tablet, Take 1 tablet (10 mg total) by mouth daily., Disp: 90 tablet, Rfl: 3   Fluocinolone Acetonide 0.01 % OIL, instill one drop in ears every 2-3 DAYS AS NEEDED FOR pruritis, Disp: 20 mL, Rfl: 1   gabapentin (NEURONTIN) 600 MG tablet, TAKE ONE TABLET BY MOUTH EVERY MORNING and TAKE ONE TABLET BY MOUTH EVERYDAY AT BEDTIME, Disp: 60 tablet, Rfl: 3   glucose blood (ACCU-CHEK GUIDE) test strip, Use to test blood sugar up to 3 times daily, Disp: 100 each, Rfl: 3   insulin glargine, 1 Unit Dial, (TOUJEO SOLOSTAR) 300 UNIT/ML Solostar  Pen, Inject 22 Units into the skin daily. Eat a snack with protein nightly before bedtime., Disp: 15 mL, Rfl: 6   insulin lispro (HUMALOG KWIKPEN) 100 UNIT/ML KwikPen, Max daily 30 units, Disp: 30 mL, Rfl: 3   Insulin Pen Needle 32G X 4 MM MISC, 1 Device by Does not apply route in the morning, at noon, in the evening, and at bedtime., Disp: 400 each, Rfl: 3   ketoconazole (NIZORAL) 2 % shampoo, Apply 1 application. topically 2 (two) times a week., Disp: 120 mL, Rfl: 2   Lancets Misc. (ACCU-CHEK FASTCLIX LANCET) KIT, Use to test blood sugar up to 3 times daily, Disp: 1 kit, Rfl: 1   levothyroxine (SYNTHROID) 150 MCG tablet, TAKE ONE TABLET BY MOUTH BEFORE BREAKFAST, Disp: 90 tablet, Rfl: 3   magnesium oxide (MAG-OX) 400 MG tablet, TAKE ONE TABLET BY MOUTH EVERY MORNING, Disp: 90 tablet, Rfl: 1  metoprolol succinate (TOPROL-XL) 25 MG 24 hr tablet, TAKE ONE TABLET BY MOUTH ONCE DAILY with OR immedaitely following A meal, Disp: 90 tablet, Rfl: 3   Multiple Vitamin (MULTIVITAMIN) tablet, Take 1 tablet by mouth daily. K FREE DAILY once daily (MVI with no vitamin K), Disp: , Rfl:    nitroGLYCERIN (NITROSTAT) 0.4 MG SL tablet, Dissolve 1 tab under tongue as needed for chest pain. May repeat every 5 minutes x 2 doses. If no relief call 9-1-1., Disp: 75 tablet, Rfl: 2   pantoprazole (PROTONIX) 20 MG tablet, Take 1 tablet (20 mg total) by mouth daily., Disp: 30 tablet, Rfl: 2   Polyethylene Glycol 3350 (MIRALAX PO), Take 1 Package by mouth daily as needed (constipation)., Disp: , Rfl:    potassium chloride SA (KLOR-CON M) 20 MEQ tablet, Take 1 tablet (20 mEq total) by mouth daily., Disp: 30 tablet, Rfl: 0   Propylene Glycol (SYSTANE BALANCE OP), Place 1 drop into both eyes daily as needed (for dry eyes)., Disp: , Rfl:    sacubitril-valsartan (ENTRESTO) 24-26 MG, Take 1 tablet by mouth 2 (two) times daily., Disp: 90 tablet, Rfl: 3   Tafamidis 61 MG CAPS, Take 1 capsule (61 mg total) by mouth daily., Disp: 30  capsule, Rfl: 12   tirzepatide (MOUNJARO) 2.5 MG/0.5ML Pen, Inject 2.5 mg into the skin once a week., Disp: 6 mL, Rfl: 3   torsemide (DEMADEX) 20 MG tablet, Take 2 tablets (40 mg total) by mouth daily. May take an additional 20 mg as needed for swelling, Disp: 180 tablet, Rfl: 3   trolamine salicylate (ASPERCREME) 10 % cream, Apply 1 application. topically as needed for muscle pain., Disp: , Rfl:    warfarin (COUMADIN) 5 MG tablet, TAKE 1/2 TABLET BY MOUTH daily OR as directed by coumadin clinic, Disp: 50 tablet, Rfl: 1   sacubitril-valsartan (ENTRESTO) 24-26 MG, Take 1 tablet by mouth 2 (two) times daily., Disp: 60 tablet, Rfl: 0 Allergies  Allergen Reactions   Ace Inhibitors Other (See Comments)    unknown   Amlodipine Other (See Comments)    unknown   Atenolol Other (See Comments)    bradycardia   Avandia [Rosiglitazone] Other (See Comments)    unknown   Darvon [Propoxyphene] Other (See Comments)    unknown   Erythromycin Itching   Hydralazine Other (See Comments)    Burning in throat and chest   Hydrocodone Other (See Comments)    Hallucinations.   Levofloxacin Itching   Morphine And Codeine Other (See Comments)    Dizzy and hallucianation, vomiting; Willing to try low dose   Percocet [Oxycodone-Acetaminophen] Other (See Comments)    hallucination   Spironolactone Other (See Comments)    unknown   Tramadol Other (See Comments)    Unknown/does not recall reaction but does not want to take again     Social History   Socioeconomic History   Marital status: Widowed    Spouse name: Not on file   Number of children: Not on file   Years of education: Not on file   Highest education level: Not on file  Occupational History   Occupation: Retired  Tobacco Use   Smoking status: Never   Smokeless tobacco: Never  Vaping Use   Vaping Use: Never used  Substance and Sexual Activity   Alcohol use: No   Drug use: Never   Sexual activity: Not Currently  Other Topics Concern    Not on file  Social History Narrative   Lives with daughter  Social Determinants of Health   Financial Resource Strain: Low Risk  (02/20/2023)   Overall Financial Resource Strain (CARDIA)    Difficulty of Paying Living Expenses: Not hard at all  Food Insecurity: No Food Insecurity (03/22/2023)   Hunger Vital Sign    Worried About Running Out of Food in the Last Year: Never true    Ran Out of Food in the Last Year: Never true  Transportation Needs: No Transportation Needs (02/20/2023)   PRAPARE - Administrator, Civil Service (Medical): No    Lack of Transportation (Non-Medical): No  Physical Activity: Inactive (02/20/2023)   Exercise Vital Sign    Days of Exercise per Week: 0 days    Minutes of Exercise per Session: 0 min  Stress: No Stress Concern Present (02/20/2023)   [employer] of Occupational Health - Occupational Stress Questionnaire    Feeling of Stress : Not at all  Social Connections: Moderately Integrated (02/20/2023)   Social Connection and Isolation Panel [NHANES]    Frequency of Communication with Friends and Family: More than three times a week    Frequency of Social Gatherings with Friends and Family: More than three times a week    Attends Religious Services: More than 4 times per year    Active Member of [employer] or Organizations: Yes    Attends Banker Meetings: More than 4 times per year    Marital Status: Widowed  Intimate Partner Violence: Not At Risk (02/20/2023)   Humiliation, Afraid, Rape, and Kick questionnaire    Fear of Current or Ex-Partner: No    Emotionally Abused: No    Physically Abused: No    Sexually Abused: No    Physical Exam      Future Appointments  Date Time Provider Department Center  04/03/2023  3:40 PM O'Neal, Ronnald Ramp, MD [facility] None  04/11/2023  3:30 PM [facility] PA/NP [facility] None  04/27/2023 12:10 PM Shamleffer, Konrad Dolores, MD [facility] None  05/03/2023  3:15 PM [facility]  [facility] None  06/21/2023  3:00 PM Herring, Milas Kocher, RPH [facility] None  02/28/2024  9:30 AM [facility]ANNUAL WELLNESS VISIT [facility]

## 2023-03-31 NOTE — Telephone Encounter (Signed)
Created in error

## 2023-04-02 NOTE — Progress Notes (Unsigned)
Cardiology Office Note:   Date:  04/03/2023  NAME:  Regina [name]    MRN: [id number] DOB:  1941/05/03   PCP:  [place], [name], MD  Cardiologist:  Reatha Harps, MD  Electrophysiologist:  None   Referring MD: [place], [name], MD   Chief Complaint  Patient presents with   Follow-up        History of Present Illness:   Regina [name] is a 82 y.o. female with a hx of cardiac amyloidosis, CHF, DM, CAD, HTN, HLD who presents for follow-up.  Ejection fraction now 35 to 40%.  Referred to advanced heart failure.  They have placed on Entresto.  Kidney function appears to be stable.  She was given a course of furoscix.  Seems to be doing well on this.  Weight today 232 pounds.  This is slightly up from her dry weight.  I believe her dry weight is roughly around 220-225 pounds.  She did not take her torsemide today.  INR level remains at goal.  We did discuss with her granddaughter that he is [place] has done well over several years however now she seems to be retaining more fluid and having more issues than she previously had.  We did discuss the natural course of her condition.  Although she has done well she does understand that worsening heart failure can come with this disease process.  She does not appear that volume up but I suspect there is some fluid on board.  I encouraged her to take her torsemide today.  Her A-fib remains rate controlled.  She is therapeutic on her Coumadin.  CKD appears to be progressing.  Problem List 1.  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 Persistent Afib -LAA slugde vs thrombus 05/05/2020  -persistent sludge 07/01/2020 -LAA thrombus persistent 08/05/2020 -on warfarin 3. DM -A1c 7.7 4. HLD -T chol 115, HDL 54, LDL 40, triglycerides 101 5. HTN 6. Non-obstructive CAD -LHC 01/19/2015  [place]  -50% mid RCA and 50% LAD -MPI 08/14/2020 -> normal 7. OSA 8. CKD 3/4 9. CVA -11/2020 @ [facility]  10. Polymyalgia Rheumatica   Past Medical History: Past Medical History:  Diagnosis Date   Back pain    CHF (congestive heart failure) (HCC)    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 01/2015 Cardiolite: + inf wall ischemia, EF 56%;  b. 01/2015 Cath: LM nl, LAD 79m, LCX min irregs, RCA dominant, 50-31m.   Sleep apnea    Swallowing difficulty     Past Surgical History: Past Surgical History:  Procedure Laterality Date   ABDOMINAL HYSTERECTOMY     BUBBLE STUDY  07/01/2020   Procedure: BUBBLE STUDY;  Surgeon: Parke Poisson, MD;  Location: [facility] ENDOSCOPY;  Service: Cardiovascular;;   BUBBLE STUDY  08/05/2020   Procedure: BUBBLE STUDY;  Surgeon: Sande Rives, MD;  Location: [facility] ENDOSCOPY;  Service: Cardiovascular;;  done with definity    CARDIAC CATHETERIZATION     ESOPHAGOGASTRODUODENOSCOPY  a. 01/2015 EGD: patent esophagus.   ESOPHAGOGASTRODUODENOSCOPY N/A 01/20/2015   Procedure: ESOPHAGOGASTRODUODENOSCOPY (EGD);  Surgeon: Jeani Hawking, MD;  Location: [facility] ENDOSCOPY;  Service: Endoscopy;  Laterality: N/A;   HAND SURGERY     JOINT REPLACEMENT     reports history bilateral TKA and right TSA   LEFT HEART CATHETERIZATION WITH CORONARY ANGIOGRAM N/A 01/19/2015   RIGHT HEART CATH N/A 12/27/2021   Procedure: RIGHT HEART CATH;  Surgeon: Runell Gess, MD;  Location: [facility] INVASIVE CV LAB;  Service: Cardiovascular;  Laterality: N/A;   TEE WITHOUT CARDIOVERSION  05/04/2020   TEE WITHOUT CARDIOVERSION N/A 05/05/2020   Procedure: TRANSESOPHAGEAL ECHOCARDIOGRAM (TEE);  Surgeon: Chrystie Nose, MD;   Location: [facility] ENDOSCOPY;  Service: Cardiovascular;  Laterality: N/A;   TEE WITHOUT CARDIOVERSION N/A 07/01/2020   Procedure: TRANSESOPHAGEAL ECHOCARDIOGRAM (TEE);  Surgeon: Parke Poisson, MD;  Location: [facility] ENDOSCOPY;  Service: Cardiovascular;  Laterality: N/A;   TEE WITHOUT CARDIOVERSION N/A 08/05/2020   Procedure: TRANSESOPHAGEAL ECHOCARDIOGRAM (TEE);  Surgeon: Sande Rives, MD;  Location: [facility] ENDOSCOPY;  Service: Cardiovascular;  Laterality: N/A;   THYROIDECTOMY      Current Medications: Current Meds  Medication Sig   acetaminophen (TYLENOL) 500 MG tablet Take 1,000 mg by mouth every 6 (six) hours as needed for mild pain.   Alcohol Swabs (B-D SINGLE USE SWABS REGULAR) PADS Use to test blood sugar up to 3 times daily   allopurinol (ZYLOPRIM) 100 MG tablet TAKE ONE TABLET BY MOUTH EVERY MORNING   atorvastatin (LIPITOR) 20 MG tablet TAKE ONE TABLET BY MOUTH EVERYDAY AT BEDTIME   Blood Glucose Monitoring Suppl (ACCU-CHEK GUIDE) w/Device KIT 1 Device by Does not apply route 3 (three) times daily.   cetirizine (ZYRTEC) 10 MG chewable tablet Chew 10 mg by mouth daily.   Cholecalciferol (VITAMIN D3) 50 MCG (2000 UT) capsule Take 2,000 Units by mouth daily.   Continuous Blood Gluc Receiver (FREESTYLE LIBRE 2 READER) DEVI Use as instructed to check blood sugar.   Continuous Blood Gluc Sensor (FREESTYLE LIBRE 2 SENSOR) MISC Change  sensors every 14 days   Eplontersen Sodium (WAINUA) 45 MG/0.8ML SOAJ Inject 45 mg into the skin every 30 (thirty) days.   FARXIGA 10 MG TABS tablet Take 1 tablet (10 mg total) by mouth daily.   Fluocinolone Acetonide 0.01 % OIL instill one drop in ears every 2-3 DAYS AS NEEDED FOR pruritis   gabapentin (NEURONTIN) 600 MG tablet TAKE ONE TABLET BY MOUTH EVERY MORNING and TAKE ONE TABLET BY MOUTH EVERYDAY AT BEDTIME   glucose blood (ACCU-CHEK GUIDE) test strip Use to test blood sugar up to 3 times daily   insulin glargine, 1 Unit Dial, (TOUJEO SOLOSTAR) 300  UNIT/ML Solostar Pen Inject 22 Units into the skin daily. Eat a snack with protein nightly before bedtime.   insulin lispro (HUMALOG KWIKPEN) 100 UNIT/ML KwikPen Max daily 30 units   Insulin Pen Needle 32G X 4 MM MISC 1 Device by Does not apply route in the morning, at noon, in the evening, and at bedtime.   ketoconazole (NIZORAL) 2 % shampoo Apply 1 application. topically 2 (two) times a week.   Lancets Misc. (ACCU-CHEK FASTCLIX LANCET) KIT Use to test blood sugar up to 3 times daily   levothyroxine (SYNTHROID) 150 MCG tablet TAKE ONE TABLET BY MOUTH BEFORE BREAKFAST   magnesium oxide (MAG-OX) 400 MG tablet TAKE ONE TABLET BY MOUTH EVERY MORNING   metoprolol succinate (TOPROL-XL) 25 MG 24 hr tablet TAKE ONE TABLET BY MOUTH  ONCE DAILY with OR immedaitely following A meal   Multiple Vitamin (MULTIVITAMIN) tablet Take 1 tablet by mouth daily. K FREE DAILY once daily (MVI with no vitamin K)   nitroGLYCERIN (NITROSTAT) 0.4 MG SL tablet Dissolve 1 tab under tongue as needed for chest pain. May repeat every 5 minutes x 2 doses. If no relief call 9-1-1.   pantoprazole (PROTONIX) 20 MG tablet Take 1 tablet (20 mg total) by mouth daily.   Polyethylene Glycol 3350 (MIRALAX PO) Take 1 Package by mouth daily as needed (constipation).   potassium chloride SA (KLOR-CON M) 20 MEQ tablet Take 1 tablet (20 mEq total) by mouth daily.   Propylene Glycol (SYSTANE BALANCE OP) Place 1 drop into both eyes daily as needed (for dry eyes).   sacubitril-valsartan (ENTRESTO) 24-26 MG Take 1 tablet by mouth 2 (two) times daily.   Tafamidis 61 MG CAPS Take 1 capsule (61 mg total) by mouth daily.   tirzepatide [facility]) 2.5 MG/0.5ML Pen Inject 2.5 mg into the skin once a week.   torsemide (DEMADEX) 20 MG tablet Take 2 tablets (40 mg total) by mouth daily. May take an additional 20 mg as needed for swelling   trolamine salicylate (ASPERCREME) 10 % cream Apply 1 application. topically as needed for muscle pain.   warfarin  (COUMADIN) 5 MG tablet TAKE 1/2 TABLET BY MOUTH daily OR as directed by coumadin clinic     Allergies:    Ace inhibitors, Amlodipine, Atenolol, Avandia [rosiglitazone], Darvon [propoxyphene], Erythromycin, Hydralazine, Hydrocodone, Levofloxacin, Morphine and codeine, Percocet [oxycodone-acetaminophen], Spironolactone, and Tramadol   Social History: Social History   Socioeconomic History   Marital status: Widowed    Spouse name: Not on file   Number of children: Not on file   Years of education: Not on file   Highest education level: Not on file  Occupational History   Occupation: Retired  Tobacco Use   Smoking status: Never   Smokeless tobacco: Never  Vaping Use   Vaping Use: Never used  Substance and Sexual Activity   Alcohol use: No   Drug use: Never   Sexual activity: Not Currently  Other Topics Concern   Not on file  Social History Narrative   Lives with daughter   Social Determinants of Health   Financial Resource Strain: Low Risk  (02/20/2023)   Overall Financial Resource Strain (CARDIA)    Difficulty of Paying Living Expenses: Not hard at all  Food Insecurity: No Food Insecurity (03/22/2023)   Hunger Vital Sign    Worried About Running Out of Food in the Last Year: Never true    Ran Out of Food in the Last Year: Never true  Transportation Needs: No Transportation Needs (02/20/2023)   PRAPARE - Administrator, Civil Service (Medical): No    Lack of Transportation (Non-Medical): No  Physical Activity: Inactive (02/20/2023)   Exercise Vital Sign    Days of Exercise per Week: 0 days    Minutes of Exercise per Session: 0 min  Stress: No Stress Concern Present (02/20/2023)   [employer] of Occupational Health - Occupational Stress Questionnaire    Feeling of Stress : Not at all  Social Connections: Moderately Integrated (02/20/2023)   Social Connection and Isolation Panel [NHANES]    Frequency of Communication with Friends and Family: More than three times a  week    Frequency of Social Gatherings with Friends and Family: More than three times a week    Attends Religious Services: More than 4  times per year    Active Member of Clubs or Organizations: Yes    Attends Banker Meetings: More than 4 times per year    Marital Status: Widowed     Family History: The patient's family history includes Alcoholism in her father; Cancer in her father; Heart disease in her mother; Hypertension in her mother.  ROS:   All other ROS reviewed and negative. Pertinent positives noted in the HPI.     EKGs/Labs/Other Studies Reviewed:   The following studies were personally reviewed by me today:  Recent Labs: 02/05/2023: Hemoglobin 12.8; Platelets 258 03/24/2023: B Natriuretic Peptide 899.0 03/31/2023: BUN 53; Creatinine, Ser 2.18; Magnesium 2.3; Potassium 3.9; Sodium 135   Recent Lipid Panel    Component Value Date/Time   CHOL 115 08/13/2021 1501   CHOL 117 03/24/2020 1428   TRIG 101.0 08/13/2021 1501   HDL 54.80 08/13/2021 1501   HDL 56 03/24/2020 1428   CHOLHDL 2 08/13/2021 1501   VLDL 20.2 08/13/2021 1501   LDLCALC 40 08/13/2021 1501   LDLCALC 49 03/24/2020 1428    Physical Exam:   VS:  BP 104/78   Pulse 66   Ht 5\' 2"  (1.575 m)   Wt 232 lb 12.8 oz (105.6 kg)   SpO2 95%   BMI 42.58 kg/m    Wt Readings from Last 3 Encounters:  04/03/23 232 lb 12.8 oz (105.6 kg)  03/31/23 224 lb 4.8 oz (101.7 kg)  03/28/23 224 lb 6.4 oz (101.8 kg)    General: Well nourished, well developed, in no acute distress Head: Atraumatic, normal size  Eyes: PEERLA, EOMI  Neck: Supple, no JVD Endocrine: No thryomegaly Cardiac: Normal S1, S2; irregular rhythm, no murmurs rubs or gallops Lungs: Clear to auscultation bilaterally, no wheezing, rhonchi or rales  Abd: Soft, nontender, no hepatomegaly  Ext: Trace edema Musculoskeletal: No deformities, BUE and BLE strength normal and equal Skin: Warm and dry, no rashes   Neuro: Alert and oriented to person,  place, time, and situation, CNII-XII grossly intact, no focal deficits  Psych: Normal mood and affect   ASSESSMENT:   VADIE PEARL is a 82 y.o. female who presents for the following: 1. Hereditary cardiac amyloidosis (HCC)   2. Chronic systolic congestive heart failure (HCC)   3. Persistent atrial fibrillation (HCC)   4. Left atrial thrombus   5. CKD (chronic kidney disease) stage 4, GFR 15-29 ml/min (HCC)     PLAN:   1. Hereditary cardiac amyloidosis (HCC) 2. Chronic systolic congestive heart failure (HCC) 3. Persistent atrial fibrillation (HCC) 4. Left atrial thrombus 5. CKD (chronic kidney disease) stage 4, GFR 15-29 ml/min (HCC) -Known history of hereditary cardiac amyloidosis.  Ejection fraction 35-40%.  Suspect she has had some progression of disease.  Evaluated by advanced heart failure.  She does appear little volume up.  I encouraged her to take her torsemide today.  We also discussed that she can take an extra 40 mg in the afternoon as needed.  I would encourage her to do this. -Current CHF regimen includes metoprolol succinate 25 mg daily, Entresto 24-26 mg twice daily, Farxiga 10 mg daily.  I think we should be cautious with other CHF agents given her CKD stage IV.  I am a bit worried about her kidney dysfunction. -She is on tafamidis.  She is on eplontersen.  She is on appropriate therapy for hereditary cardiac amyloidosis. -I do have concerns that her disease may be progressing.  I did discuss this with  her and her grandmother today.  We also discussed if her kidney function worsens she may not be a great candidate for hemodialysis.  We will see how she does. -I encouraged her to follow with the advanced heart failure clinic. -Regarding her A-fib she has had recurrent left atrial appendage thrombus despite multiple anticoagulants.  She is also had a stroke.  We did again discuss reattempting TEE/cardioversion however this may be futile.  For now she seems to be stable just  worsening volume overload.  She will continue with the advanced heart failure clinic.  Disposition: Return in about 3 months (around 07/04/2023).  Medication Adjustments/Labs and Tests Ordered: Current medicines are reviewed at length with the patient today.  Concerns regarding medicines are outlined above.  No orders of the defined types were placed in this encounter.  No orders of the defined types were placed in this encounter.   Patient Instructions  Medication Instructions:  Take Torsemide today - and take as needed for swelling  *If you need a refill on your cardiac medications before your next appointment, please call your pharmacy*  Follow-Up: At [facility], you and your health needs are our priority.  As part of our continuing mission to provide you with exceptional heart care, we have created designated Provider Care Teams.  These Care Teams include your primary Cardiologist (physician) and Advanced Practice Providers (APPs -  Physician Assistants and Nurse Practitioners) who all work together to provide you with the care you need, when you need it.  We recommend signing up for the patient portal called "MyChart".  Sign up information is provided on this After Visit Summary.  MyChart is used to connect with patients for Virtual Visits (Telemedicine).  Patients are able to view lab/test results, encounter notes, upcoming appointments, etc.  Non-urgent messages can be sent to your provider as well.   To learn more about what you can do with MyChart, go to [email or web address].    Your next appointment:   3 month(s)  Provider:   Reatha Harps, MD        Time Spent with Patient: I have spent a total of 35 minutes with patient reviewing hospital notes, telemetry, EKGs, labs and examining the patient as well as establishing an assessment and plan that was discussed with the patient.  > 50% of time was spent in direct patient care.  Signed, Lenna Gilford. Flora Lipps, MD,  [facility]  [facility]  [address], [address] [place], [place] [address] [phone]  04/03/2023 5:48 PM

## 2023-04-03 ENCOUNTER — Ambulatory Visit: Payer: Medicare Other | Attending: Cardiovascular Disease | Admitting: Cardiovascular Disease

## 2023-04-03 ENCOUNTER — Encounter: Payer: Self-pay | Admitting: Cardiovascular Disease

## 2023-04-03 ENCOUNTER — Ambulatory Visit: Payer: Medicare Other | Admitting: Cardiovascular Disease

## 2023-04-03 VITALS — BP 104/78 | HR 66 | Ht 62.0 in | Wt 232.8 lb

## 2023-04-03 DIAGNOSIS — N184 Chronic kidney disease, stage 4 (severe): Secondary | ICD-10-CM

## 2023-04-03 DIAGNOSIS — I4819 Other persistent atrial fibrillation: Secondary | ICD-10-CM | POA: Diagnosis not present

## 2023-04-03 DIAGNOSIS — I5022 Chronic systolic (congestive) heart failure: Secondary | ICD-10-CM

## 2023-04-03 DIAGNOSIS — I43 Cardiomyopathy in diseases classified elsewhere: Secondary | ICD-10-CM | POA: Diagnosis not present

## 2023-04-03 DIAGNOSIS — E854 Organ-limited amyloidosis: Secondary | ICD-10-CM

## 2023-04-03 DIAGNOSIS — I513 Intracardiac thrombosis, not elsewhere classified: Secondary | ICD-10-CM | POA: Diagnosis not present

## 2023-04-03 NOTE — Patient Instructions (Signed)
Medication Instructions:  Take Torsemide today - and take as needed for swelling  *If you need a refill on your cardiac medications before your next appointment, please call your pharmacy*  Follow-Up: At [facility], you and your health needs are our priority.  As part of our continuing mission to provide you with exceptional heart care, we have created designated Provider Care Teams.  These Care Teams include your primary Cardiologist (physician) and Advanced Practice Providers (APPs -  Physician Assistants and Nurse Practitioners) who all work together to provide you with the care you need, when you need it.  We recommend signing up for the patient portal called "MyChart".  Sign up information is provided on this After Visit Summary.  MyChart is used to connect with patients for Virtual Visits (Telemedicine).  Patients are able to view lab/test results, encounter notes, upcoming appointments, etc.  Non-urgent messages can be sent to your provider as well.   To learn more about what you can do with MyChart, go to [email or web address].    Your next appointment:   3 month(s)  Provider:   Reatha Harps, MD

## 2023-04-05 ENCOUNTER — Other Ambulatory Visit: Payer: Self-pay | Admitting: Cardiovascular Disease

## 2023-04-10 ENCOUNTER — Telehealth (HOSPITAL_COMMUNITY): Payer: Self-pay

## 2023-04-10 ENCOUNTER — Emergency Department (HOSPITAL_COMMUNITY): Payer: Medicare Other

## 2023-04-10 ENCOUNTER — Emergency Department (HOSPITAL_COMMUNITY)
Admission: EM | Admit: 2023-04-10 | Discharge: 2023-04-10 | Disposition: A | Discharge status: Home / Self Care | Payer: Medicare Other | Attending: Emergency Medicine | Admitting: Emergency Medicine

## 2023-04-10 ENCOUNTER — Other Ambulatory Visit: Payer: Self-pay

## 2023-04-10 DIAGNOSIS — R0789 Other chest pain: Secondary | ICD-10-CM | POA: Diagnosis not present

## 2023-04-10 DIAGNOSIS — E854 Organ-limited amyloidosis: Secondary | ICD-10-CM

## 2023-04-10 DIAGNOSIS — I43 Cardiomyopathy in diseases classified elsewhere: Secondary | ICD-10-CM | POA: Diagnosis not present

## 2023-04-10 DIAGNOSIS — N184 Chronic kidney disease, stage 4 (severe): Secondary | ICD-10-CM | POA: Diagnosis not present

## 2023-04-10 DIAGNOSIS — I2510 Atherosclerotic heart disease of native coronary artery without angina pectoris: Secondary | ICD-10-CM | POA: Insufficient documentation

## 2023-04-10 DIAGNOSIS — I5042 Chronic combined systolic (congestive) and diastolic (congestive) heart failure: Secondary | ICD-10-CM | POA: Diagnosis not present

## 2023-04-10 DIAGNOSIS — Z96611 Presence of right artificial shoulder joint: Secondary | ICD-10-CM | POA: Diagnosis not present

## 2023-04-10 DIAGNOSIS — R079 Chest pain, unspecified: Secondary | ICD-10-CM | POA: Diagnosis not present

## 2023-04-10 DIAGNOSIS — E1122 Type 2 diabetes mellitus with diabetic chronic kidney disease: Secondary | ICD-10-CM | POA: Insufficient documentation

## 2023-04-10 DIAGNOSIS — R001 Bradycardia, unspecified: Secondary | ICD-10-CM | POA: Diagnosis not present

## 2023-04-10 DIAGNOSIS — I499 Cardiac arrhythmia, unspecified: Secondary | ICD-10-CM | POA: Diagnosis not present

## 2023-04-10 DIAGNOSIS — Z794 Long term (current) use of insulin: Secondary | ICD-10-CM | POA: Diagnosis not present

## 2023-04-10 DIAGNOSIS — R609 Edema, unspecified: Secondary | ICD-10-CM | POA: Diagnosis not present

## 2023-04-10 DIAGNOSIS — I4892 Unspecified atrial flutter: Secondary | ICD-10-CM | POA: Diagnosis not present

## 2023-04-10 DIAGNOSIS — Z79899 Other long term (current) drug therapy: Secondary | ICD-10-CM | POA: Insufficient documentation

## 2023-04-10 DIAGNOSIS — Z743 Need for continuous supervision: Secondary | ICD-10-CM | POA: Diagnosis not present

## 2023-04-10 DIAGNOSIS — I517 Cardiomegaly: Secondary | ICD-10-CM | POA: Diagnosis not present

## 2023-04-10 DIAGNOSIS — R6889 Other general symptoms and signs: Secondary | ICD-10-CM | POA: Diagnosis not present

## 2023-04-10 DIAGNOSIS — Z7901 Long term (current) use of anticoagulants: Secondary | ICD-10-CM | POA: Diagnosis not present

## 2023-04-10 DIAGNOSIS — I130 Hypertensive heart and chronic kidney disease with heart failure and stage 1 through stage 4 chronic kidney disease, or unspecified chronic kidney disease: Secondary | ICD-10-CM | POA: Diagnosis not present

## 2023-04-10 DIAGNOSIS — R918 Other nonspecific abnormal finding of lung field: Secondary | ICD-10-CM | POA: Diagnosis not present

## 2023-04-10 LAB — CBC
HCT: 40.1 % (ref 36.0–46.0)
Hemoglobin: 12.9 g/dL (ref 12.0–15.0)
MCH: 29.5 pg (ref 26.0–34.0)
MCHC: 32.2 g/dL (ref 30.0–36.0)
MCV: 91.8 fL (ref 80.0–100.0)
Platelets: 252 10*3/uL (ref 150–400)
RBC: 4.37 MIL/uL (ref 3.87–5.11)
RDW: 15.4 % (ref 11.5–15.5)
WBC: 5.7 10*3/uL (ref 4.0–10.5)
nRBC: 0 % (ref 0.0–0.2)

## 2023-04-10 LAB — BASIC METABOLIC PANEL
Anion gap: 12 (ref 5–15)
BUN: 55 mg/dL — ABNORMAL HIGH (ref 8–23)
CO2: 26 mmol/L (ref 22–32)
Calcium: 9.3 mg/dL (ref 8.9–10.3)
Chloride: 96 mmol/L — ABNORMAL LOW (ref 98–111)
Creatinine, Ser: 2.29 mg/dL — ABNORMAL HIGH (ref 0.44–1.00)
GFR, Estimated: 21 mL/min — ABNORMAL LOW (ref >60)
Glucose, Bld: 158 mg/dL — ABNORMAL HIGH (ref 70–99)
Potassium: 3.7 mmol/L (ref 3.5–5.1)
Sodium: 134 mmol/L — ABNORMAL LOW (ref 135–145)

## 2023-04-10 LAB — PROTIME-INR
INR: 2.5 — ABNORMAL HIGH (ref 0.8–1.2)
Prothrombin Time: 27.3 seconds — ABNORMAL HIGH (ref 11.4–15.2)

## 2023-04-10 LAB — TROPONIN I (HIGH SENSITIVITY)
Troponin I (High Sensitivity): 54 ng/L — ABNORMAL HIGH (ref <18)
Troponin I (High Sensitivity): 57 ng/L — ABNORMAL HIGH (ref <18)

## 2023-04-10 MED ORDER — ISOSORBIDE MONONITRATE ER 30 MG PO TB24: 30.0000 mg | ORAL_TABLET | Freq: Every day | ORAL | 0 refills | Status: DC

## 2023-04-10 MED ORDER — ISOSORBIDE MONONITRATE ER 30 MG PO TB24
30.0000 mg | ORAL_TABLET | Freq: Once | ORAL | Status: AC
  Administered 2023-04-10: 30 mg via ORAL
  Filled 2023-04-10: qty 1

## 2023-04-10 NOTE — Discharge Instructions (Addendum)
These keep your appointment with your cardiologist tomorrow.  Return here for concerning changes in your condition.

## 2023-04-10 NOTE — ED Triage Notes (Signed)
Pt bib [facility] from home c/o CP that started at 2100 last night (6/23). Pt c/o sharp pain and pressure that radiates to the Lt shoulder. Denies n/v and dizziness. Hx of CHF, afib. Took an extra fluid pill around 1900 last night d/t her ankles and feet looking more swollen. Pt was given 2 nitros and 324mg  asa by ems.

## 2023-04-10 NOTE — Consult Note (Signed)
Cardiology Consultation   Patient ID: Regina [name] MRN: [id number]; DOB: 07/14/1941  Admit date: 04/10/2023 Date of Consult: 04/10/2023  PCP:  [place], [name], MD   [facility] Providers Cardiologist:  Reatha Harps, MD     Patient Profile:   Regina [name] is a 82 y.o. female with a hx of cardiac amyloidosis, HFrEF, DM, CAD, HTN, HLD, persistent atrial fibrillation, history of CVA who is being seen 04/10/2023 for the evaluation of chest pain at the request of Dr. Adela Lank.  History of Present Illness:   Regina [name] is a 82 year old female with above medical history who is followed by Dr. Flora Lipps. Patient had an echocardiogram in 03/2020 EF 55-60%, moderate LVH, grade II DD, normal RV systolic function, mild mitral valve regurgitation. PYP scan in 04/2020 was strongly compatible with TTR-amyloidosis. Later in 04/2020, patient was diagnosed with Atrial fibrillation. Was scheduled for TEE guided DCCV on 05/05/20, but TEE showed a LAA thrombus so DCCV was deferred. TEE also showed LVEF 35-40%, severe LVH. Repeat TEE on 08/05/20 after 3 months of anticoagulation with eliquis showed sludge/thrombus in the LAA, LVEF severely reduced to 10-15%, severe LVH, severely reduced RV systolic function. He was transitioned to coumadin therapy. Nuclear stress test on 08/14/20 was negative for ischemia. Patient was referred to Advanced heart failure in 09/2020. Cardiac MRI in 10/2020 showed moderate LVH, LV hypokinesis toward the base, LVEF 30%, RVEF 30%, LGE pattern and high extracellular volume percentage (67%) highly suggestive of cardiac amyloidosis. She was maintained on metoprolol, tafamidis, farxiga, coumadin. Not on ACE/ARB/ARNI/MRA due to CKD. Repeat echocardiogram in 04/2021 showed EF 45-50%, moderate LVH, moderately reduced RV systolic function, mild mitral valve regurgitation.   In 12/2021, patient was admitted with chest tightness. Patient was volume overloaded on exam, and her CXR and  BNP were consistent with CHF exacerbation. She was treated with IV lasix. Echocardiogram on 12/24/21 showed EF 40-45%, mild LVH, low-normal RV systolic function, mild mitral valve regurgitation. Underwent RHC that demonstrated elevated filling pressures with relatively high V wave and pulmonary artery HTN. She did not undergo LHC due to CKD, and she had resolution of chest tightness after diuresis.  Patient was seen by Dr. Flora Lipps on 01/06/23 as an outpatient and complained of abdominal distention. She was not grossly volume overloaded on exam, and it was thought that symptoms were related to gut edema in the setting of cardiac amyloidosis. Echocardiogram 02/03/23 showed EF 35-40%, mild LVH, normal RV systolic function, mild mitral valve regurgitation. Cardiac monitor in 02/2023 showed 100% afib burden with avg HR 79 BPM, 2 episodes of NSVT with longest episode lasting 5 beats.   Patient presented to the ED on 04/10/23 complaining of chest pain that started the evening prior. Pain felt sharp and like pressure. The pain was mostly in her left shoulder and has been constant. The chest pressure was central and worse with exertion. It was improved with nitroglycerin in the ambulance. She has never had this type of chest pain before. Labs in the ED significant for hsTn 54, 57. Creatinine 2.29, WBC 5.7, hemoglobin 12.9. CXR showed stable cardiomegaly with chronic appearing interstitial lung markings, could not exclude mild superimposed interstitial edema.   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 01/2015 Cardiolite: + inf wall ischemia,  EF 56%;  b. 01/2015 Cath: LM nl, LAD 72m, LCX min irregs, RCA dominant, 50-59m.   Sleep apnea    Swallowing difficulty     Past Surgical History:  Procedure Laterality Date   ABDOMINAL HYSTERECTOMY     BUBBLE STUDY  07/01/2020   Procedure: BUBBLE STUDY;  Surgeon: Parke Poisson, MD;  Location: [facility] ENDOSCOPY;  Service: Cardiovascular;;   BUBBLE STUDY  08/05/2020   Procedure: BUBBLE STUDY;  Surgeon: Sande Rives, MD;  Location: [facility] ENDOSCOPY;  Service: Cardiovascular;;  done with definity    CARDIAC CATHETERIZATION     ESOPHAGOGASTRODUODENOSCOPY     a. 01/2015 EGD: patent esophagus.   ESOPHAGOGASTRODUODENOSCOPY N/A 01/20/2015   Procedure: ESOPHAGOGASTRODUODENOSCOPY (EGD);  Surgeon: Jeani Hawking, MD;  Location: [facility] ENDOSCOPY;  Service: Endoscopy;  Laterality: N/A;   HAND SURGERY     JOINT REPLACEMENT     reports history bilateral TKA and right TSA   LEFT HEART CATHETERIZATION WITH CORONARY ANGIOGRAM N/A 01/19/2015   RIGHT HEART CATH N/A 12/27/2021   Procedure: RIGHT HEART CATH;  Surgeon: Runell Gess, MD;  Location: [place] INVASIVE CV LAB;  Service: Cardiovascular;  Laterality: N/A;   TEE WITHOUT CARDIOVERSION  05/04/2020   TEE WITHOUT CARDIOVERSION N/A 05/05/2020   Procedure: TRANSESOPHAGEAL ECHOCARDIOGRAM (TEE);  Surgeon: Chrystie Nose, MD;  Location: [facility] ENDOSCOPY;  Service: Cardiovascular;  Laterality: N/A;   TEE WITHOUT CARDIOVERSION N/A 07/01/2020   Procedure: TRANSESOPHAGEAL ECHOCARDIOGRAM (TEE);  Surgeon: Parke Poisson, MD;  Location: [facility] ENDOSCOPY;  Service: Cardiovascular;  Laterality: N/A;   TEE WITHOUT CARDIOVERSION N/A 08/05/2020   Procedure: TRANSESOPHAGEAL ECHOCARDIOGRAM (TEE);  Surgeon: Sande Rives, MD;  Location: [facility] ENDOSCOPY;  Service: Cardiovascular;  Laterality: N/A;   THYROIDECTOMY       Home Medications:  Prior to Admission medications   Medication Sig Start Date End Date Taking? Authorizing Provider  acetaminophen (TYLENOL) 500 MG tablet Take  1,000 mg by mouth every 6 (six) hours as needed for mild pain.   Yes [provider]  allopurinol (ZYLOPRIM) 100 MG tablet TAKE ONE TABLET BY MOUTH EVERY MORNING Patient taking differently: Take 100 mg by mouth. 11/11/22  Yes [place], [name], MD  atorvastatin (LIPITOR) 20 MG tablet TAKE ONE TABLET BY MOUTH EVERYDAY AT BEDTIME Patient taking differently: Take 20 mg by mouth at bedtime. 02/21/23  Yes Bensimhon, Bevelyn Buckles, MD  cetirizine (ZYRTEC) 10 MG chewable tablet Chew 10 mg by mouth daily.   Yes [provider]  Eplontersen Sodium (WAINUA) 45 MG/0.8ML SOAJ Inject 45 mg into the skin every 30 (thirty) days. 11/28/22  Yes O'Neal, Ronnald Ramp, MD  FARXIGA 10 MG TABS tablet Take 1 tablet (10 mg total) by mouth daily. 02/21/23  Yes Bensimhon, Bevelyn Buckles, MD  Fluocinolone Acetonide 0.01 % OIL instill one drop in ears every 2-3 DAYS AS NEEDED FOR pruritis 04/11/22  Yes [place], [name], MD  gabapentin (NEURONTIN) 600 MG tablet TAKE ONE TABLET BY MOUTH EVERY MORNING and TAKE ONE TABLET BY MOUTH EVERYDAY AT BEDTIME Patient taking differently: Take 600 mg by mouth 2 (two) times daily. 02/13/23  Yes [place], [name], MD  insulin glargine, 1 Unit Dial, (TOUJEO SOLOSTAR) 300 UNIT/ML Solostar Pen Inject 22 Units into the skin daily.  Eat a snack with protein nightly before bedtime. 08/01/22  Yes Shamleffer, Konrad Dolores, MD  insulin lispro (HUMALOG KWIKPEN) 100 UNIT/ML KwikPen Max daily 30 units Patient taking differently: Inject 4-8 Units into the skin daily as needed. Sliding scale: Max daily 30 units 08/01/22  Yes Shamleffer, Konrad Dolores, MD  ketoconazole (NIZORAL) 2 % shampoo Apply 1 application. topically 2 (two) times a week. Patient taking differently: Apply 1 application  topically daily as needed for irritation. 03/21/22  Yes [place], [name], MD  levothyroxine (SYNTHROID) 150 MCG tablet TAKE ONE TABLET BY MOUTH BEFORE BREAKFAST Patient taking differently: Take 150 mcg by mouth daily before  breakfast. 10/07/22  Yes [place], [name], MD  magnesium oxide (MAG-OX) 400 MG tablet TAKE ONE TABLET BY MOUTH EVERY MORNING 11/11/22  Yes [place], [name], MD  metoprolol succinate (TOPROL-XL) 25 MG 24 hr tablet TAKE ONE TABLET BY MOUTH ONCE DAILY with OR immedaitely following A meal Patient taking differently: Take 25 mg by mouth daily. TAKE ONE TABLET BY MOUTH ONCE DAILY with OR immedaitely following A meal 02/21/23  Yes Bensimhon, Bevelyn Buckles, MD  Multiple Vitamin (MULTIVITAMIN) tablet Take 1 tablet by mouth daily. K FREE DAILY once daily (MVI with no vitamin K)   Yes [provider]  Multiple Vitamins-Minerals (ONE-A-DAY WOMENS PO) Take 1 tablet by mouth daily.   Yes [provider]  nitroGLYCERIN (NITROSTAT) 0.4 MG SL tablet Dissolve 1 tab under tongue as needed for chest pain. May repeat every 5 minutes x 2 doses. If no relief call 9-1-1. 02/13/23  Yes O'Neal, Ronnald Ramp, MD  pantoprazole (PROTONIX) 20 MG tablet Take 1 tablet (20 mg total) by mouth daily. 03/10/23  Yes [place], [name], MD  potassium chloride SA (KLOR-CON M) 20 MEQ tablet Take 1 tablet (20 mEq total) by mouth daily. 03/24/23  Yes [place], Anderson Malta, FNP  Propylene Glycol (SYSTANE BALANCE OP) Place 1 drop into both eyes daily as needed (for dry eyes).   Yes [provider]  sacubitril-valsartan (ENTRESTO) 24-26 MG Take 1 tablet by mouth 2 (two) times daily. 02/21/23  Yes Bensimhon, Bevelyn Buckles, MD  Tafamidis 61 MG CAPS Take 1 capsule (61 mg total) by mouth daily. 12/06/22  Yes O'Neal, Ronnald Ramp, MD  tirzepatide [facility]) 2.5 MG/0.5ML Pen Inject 2.5 mg into the skin once a week. Patient taking differently: Inject 2.5 mg into the skin once a week. Monday 01/31/23  Yes Shamleffer, Konrad Dolores, MD  torsemide (DEMADEX) 20 MG tablet Take 2 tablets (40 mg total) by mouth daily. May take an additional 20 mg as needed for swelling 03/03/23  Yes Bensimhon, Bevelyn Buckles, MD  trolamine salicylate (ASPERCREME) 10 % cream Apply  1 application. topically as needed for muscle pain.   Yes [provider]  warfarin (COUMADIN) 5 MG tablet TAKE 1/2 TABLET BY MOUTH daily OR as directed by coumadin clinic Patient taking differently: Take 2.5 mg by mouth at bedtime. TAKE 1/2 TABLET BY MOUTH daily OR as directed by coumadin clinic 01/09/23  Yes O'Neal, Ronnald Ramp, MD  Alcohol Swabs (B-D SINGLE USE SWABS REGULAR) PADS Use to test blood sugar up to 3 times daily 01/11/21   Shamleffer, Konrad Dolores, MD  Blood Glucose Monitoring Suppl (ACCU-CHEK GUIDE) w/Device KIT 1 Device by Does not apply route 3 (three) times daily. 09/15/22   Shamleffer, Konrad Dolores, MD  Continuous Blood Gluc Receiver (FREESTYLE LIBRE 2 READER) DEVI Use as instructed to check blood sugar. 12/12/22   Shamleffer, Konrad Dolores, MD  Continuous Blood Gluc Sensor (FREESTYLE LIBRE 2 SENSOR) MISC Change  sensors every 14 days 12/06/22   Shamleffer, Konrad Dolores, MD  glucose blood (ACCU-CHEK GUIDE) test strip Use to test blood sugar up to 3 times daily 09/15/22   Shamleffer, Konrad Dolores, MD  Insulin Pen Needle 32G X 4 MM MISC 1 Device by Does not apply route in the morning, at noon, in the evening, and at bedtime. 08/01/22   Shamleffer, Konrad Dolores, MD  Lancets Misc. (ACCU-CHEK FASTCLIX LANCET) KIT Use to test blood sugar up to 3 times daily 09/15/22   Shamleffer, Konrad Dolores, MD  Polyethylene Glycol 3350 (MIRALAX PO) Take 1 Package by mouth daily as needed (constipation).    [provider]    Inpatient Medications: Scheduled Meds:  Continuous Infusions:  PRN Meds:   Allergies:    Allergies  Allergen Reactions   Ace Inhibitors Other (See Comments)    Unknown reaction   Amlodipine Other (See Comments)    Unknown reaction   Atenolol Other (See Comments)    bradycardia   Avandia [Rosiglitazone] Other (See Comments)    Unknown reaction   Darvon [Propoxyphene] Other (See Comments)    Hallucinations    Erythromycin  Itching   Hydralazine Other (See Comments)    Burning in throat and chest   Hydrocodone Other (See Comments)    Hallucinations.   Levofloxacin Itching   Morphine And Codeine Other (See Comments)    Dizzy and hallucianation, vomiting; Willing to try low dose   Percocet [Oxycodone-Acetaminophen] Other (See Comments)    hallucination   Spironolactone Other (See Comments)    Unknown reaction   Tramadol Other (See Comments)    Unknown/does not recall reaction but does not want to take again    Social History:   Social History   Socioeconomic History   Marital status: Widowed    Spouse name: Not on file   Number of children: Not on file   Years of education: Not on file   Highest education level: Not on file  Occupational History   Occupation: Retired  Tobacco Use   Smoking status: Never   Smokeless tobacco: Never  Vaping Use   Vaping Use: Never used  Substance and Sexual Activity   Alcohol use: No   Drug use: Never   Sexual activity: Not Currently  Other Topics Concern   Not on file  Social History Narrative   Lives with daughter   Social Determinants of Health   Financial Resource Strain: Low Risk  (02/20/2023)   Overall Financial Resource Strain (CARDIA)    Difficulty of Paying Living Expenses: Not hard at all  Food Insecurity: No Food Insecurity (03/22/2023)   Hunger Vital Sign    Worried About Running Out of Food in the Last Year: Never true    Ran Out of Food in the Last Year: Never true  Transportation Needs: No Transportation Needs (02/20/2023)   PRAPARE - Administrator, Civil Service (Medical): No    Lack of Transportation (Non-Medical): No  Physical Activity: Inactive (02/20/2023)   Exercise Vital Sign    Days of Exercise per Week: 0 days    Minutes of Exercise per Session: 0 min  Stress: No Stress Concern Present (02/20/2023)   [employer] of Occupational Health - Occupational Stress Questionnaire    Feeling of Stress : Not at all  Social  Connections: Moderately Integrated (02/20/2023)   Social Connection and Isolation Panel [NHANES]    Frequency of Communication with  Friends and Family: More than three times a week    Frequency of Social Gatherings with Friends and Family: More than three times a week    Attends Religious Services: More than 4 times per year    Active Member of [employer] or Organizations: Yes    Attends Banker Meetings: More than 4 times per year    Marital Status: Widowed  Intimate Partner Violence: Not At Risk (02/20/2023)   Humiliation, Afraid, Rape, and Kick questionnaire    Fear of Current or Ex-Partner: No    Emotionally Abused: No    Physically Abused: No    Sexually Abused: No    Family History:    Family History  Problem Relation Age of Onset   Heart disease Mother    Hypertension Mother    Cancer Father    Alcoholism Father      ROS:  Please see the history of present illness.   All other ROS reviewed and negative.     Physical Exam/Data:   Vitals:   04/10/23 0447  BP: 120/65  Pulse: 83  Resp: 18  Temp: 97.8 F (36.6 C)  TempSrc: Oral  SpO2: 99%   No intake or output data in the 24 hours ending 04/10/23 0741    04/03/2023    3:34 PM 03/31/2023   10:10 AM 03/28/2023    9:31 AM  Last 3 Weights  Weight (lbs) 232 lb 12.8 oz 224 lb 4.8 oz 224 lb 6.4 oz  Weight (kg) 105.597 kg 101.742 kg 101.787 kg     There is no height or weight on file to calculate BMI.   General appearance: alert, no distress, and morbidly obese Neck: no carotid bruit, no JVD, and thyroid not enlarged, symmetric, no tenderness/mass/nodules Lungs: diminished breath sounds bibasilar Heart: regular rate and rhythm, S1, S2 normal, no murmur, click, rub or gallop Abdomen: soft, non-tender; bowel sounds normal; no masses,  no organomegaly and obese Extremities: extremities normal, atraumatic, no cyanosis or edema Pulses: 2+ and symmetric Skin: Skin color, texture, turgor normal. No rashes or  lesions Neurologic: Grossly normal Psych: Pleasant   EKG:  The EKG was personally reviewed and demonstrates:  Atrial flutter at 85 Telemetry:  Telemetry was personally reviewed and demonstrates:  AFib/flutter with variable response  Relevant CV Studies: Cardiac Studies & Procedures   CARDIAC CATHETERIZATION  CARDIAC CATHETERIZATION 12/27/2021  Narrative Images from the original result were not included. JASA DUNDON is a 82 y.o. female   [id number] LOCATION:  FACILITY: [facility] PHYSICIAN: Nanetta Batty, M.D. Jul 05, 1941   DATE OF PROCEDURE:  12/27/2021  DATE OF DISCHARGE:     CARDIAC CATHETERIZATION    History obtained from chart review.PRINCE OLIVIER is a 82 y.o. female with a hx of persistent afib, LAA thrombus, cardiac amyloidosis/HFpEF, DM, CKD stage IV, hypothyroidism who is being seen today for the evaluation of chest pain at the request of Dr. Virgina Norfolk, DO.  Her INR is 2.5 today.  Her serum creatinine is around 2.  She still has signs of volume overload clinically.  She is was referred for right heart cath.   HEMODYNAMICS: Right heart cath  1: Right atrial pressure-12/12 2: Right ventricular pressure-60/3 3: Pulmonary artery pressure-66/28, mean 34 4: Pulmonary wedge pressure-A-wave equals 21, V wave equals 35, mean equals 26 5: Cardiac output-4.66 L/min with an index of 2.32 L/min/m  Impression Ms. Mcgonagle has elevated filling pressures with a relatively high V wave and pulmonary artery hypertension.  She will  need additional diuresis.  The sheath was removed and a pressure dressing was placed on the right antecubital fossa to achieve hemostasis.  The patient left lab in stable condition.  Nanetta Batty. MD, [facility] 12/27/2021 9:19 AM   STRESS TESTS  MYOCARDIAL PERFUSION IMAGING 08/14/2020  Narrative  Lexiscan stress is electrically negative for ischemia  Myovue scan shows normal perfusion. No signficant ischemia or scar.  LVEF is  calculated at 29% with diffuse hypokinesis LV is dilated.  Compared to Myovue in 2016, perfusion is normal but LVEF is now significantly decreased   ECHOCARDIOGRAM  ECHOCARDIOGRAM COMPLETE 02/03/2023  Narrative ECHOCARDIOGRAM REPORT    Patient Name:   MEGGIN OLA Date of Exam: 02/03/2023 Medical Rec #:  [id number]         Height:       62.0 in Accession #:    [id number]        Weight:       232.0 lb Date of Birth:  10-Sep-1941        BSA:          2.036 m Patient Age:    57 years          BP:           118/70 mmHg Patient Gender: F                 HR:           63 bpm. Exam Location:  [address]  Procedure: 2D Echo, Cardiac Doppler, Color Doppler, 3D Echo and Strain Analysis  Indications:    I50.22 Chronic systolic heart failure  History:        Patient has prior history of Echocardiogram examinations, most recent 12/24/2021. CAD; Risk Factors:Hypertension and Diabetes. Sleep apnea-CPAP.  Sonographer:    Daphine Deutscher RDCS Referring Phys: [id number] Ronnald Ramp O'NEAL  IMPRESSIONS   1. Left ventricular ejection fraction, by estimation, is 35 to 40%. Left ventricular ejection fraction by 3D volume is 38 %. The left ventricle has moderately decreased function. The left ventricle demonstrates global hypokinesis. There is mild left ventricular hypertrophy. Left ventricular diastolic parameters are indeterminate. The average left ventricular global longitudinal strain is -15.2 %. The global longitudinal strain is abnormal. 2. Right ventricular systolic function is normal. The right ventricular size is normal. 3. Left atrial size was mildly dilated. 4. The mitral valve is normal in structure. Mild mitral valve regurgitation. No evidence of mitral stenosis. 5. The aortic valve is tricuspid. Aortic valve regurgitation is not visualized. No aortic stenosis is present. 6. The inferior vena cava is normal in size with greater than 50% respiratory variability, suggesting  right atrial pressure of 3 mmHg.  Comparison(s): Prior images reviewed side by side.  FINDINGS Left Ventricle: Left ventricular ejection fraction, by estimation, is 35 to 40%. Left ventricular ejection fraction by 3D volume is 38 %. The left ventricle has moderately decreased function. The left ventricle demonstrates global hypokinesis. The average left ventricular global longitudinal strain is -15.2 %. The global longitudinal strain is abnormal. The left ventricular internal cavity size was normal in size. There is mild left ventricular hypertrophy. Left ventricular diastolic parameters are indeterminate.  Right Ventricle: The right ventricular size is normal. No increase in right ventricular wall thickness. Right ventricular systolic function is normal.  Left Atrium: Left atrial size was mildly dilated.  Right Atrium: Right atrial size was normal in size.  Pericardium: There is no evidence of pericardial effusion.  Mitral Valve: The mitral valve  is normal in structure. Mild mitral valve regurgitation. No evidence of mitral valve stenosis.  Tricuspid Valve: The tricuspid valve is normal in structure. Tricuspid valve regurgitation is trivial. No evidence of tricuspid stenosis.  Aortic Valve: The aortic valve is tricuspid. Aortic valve regurgitation is not visualized. No aortic stenosis is present.  Pulmonic Valve: The pulmonic valve was normal in structure. Pulmonic valve regurgitation is trivial. No evidence of pulmonic stenosis.  Aorta: The aortic root is normal in size and structure.  Venous: The inferior vena cava is normal in size with greater than 50% respiratory variability, suggesting right atrial pressure of 3 mmHg.  IAS/Shunts: No atrial level shunt detected by color flow Doppler.   LEFT VENTRICLE PLAX 2D LVIDd:         4.60 cm LVIDs:         3.60 cm         2D LV PW:         1.20 cm         Longitudinal LV IVS:        1.10 cm         Strain LVOT diam:     1.90 cm          2D Strain GLS  -15.0 % LV SV:         35              (A2C): LV SV Index:   17              2D Strain GLS  -15.0 % LVOT Area:     2.84 cm        (A3C): 2D Strain GLS  -15.7 % (A4C): 2D Strain GLS  -15.2 % Avg:  3D Volume EF LV 3D EF:    Left ventricul ar ejection fraction by 3D volume is 38 %.  3D Volume EF: 3D EF:        38 % LV EDV:       178 ml LV ESV:       110 ml LV SV:        68 ml  RIGHT VENTRICLE            IVC RV Basal diam:  4.30 cm    IVC diam: 2.00 cm RV S prime:     7.72 cm/s TAPSE (M-mode): 1.2 cm  LEFT ATRIUM             Index        RIGHT ATRIUM           Index LA diam:        4.90 cm 2.41 cm/m   RA Area:     19.30 cm LA Vol (A2C):   58.2 ml 28.58 ml/m  RA Volume:   59.30 ml  29.12 ml/m LA Vol (A4C):   74.1 ml 36.39 ml/m LA Biplane Vol: 68.2 ml 33.49 ml/m AORTIC VALVE LVOT Vmax:   67.23 cm/s LVOT Vmean:  45.067 cm/s LVOT VTI:    0.123 m  AORTA Ao Root diam: 3.20 cm Ao Asc diam:  3.30 cm  MV E velocity: 133.67 cm/s  TRICUSPID VALVE TR Peak grad:   28.9 mmHg TR Vmax:        269.00 cm/s  SHUNTS Systemic VTI:  0.12 m Systemic Diam: 1.90 cm  Donato Schultz MD Electronically signed by Donato Schultz MD Signature Date/Time: 02/03/2023/5:15:50 PM    Final   TEE  ECHO TEE 08/05/2020  Narrative TRANSESOPHOGEAL  ECHO REPORT    Patient Name:   Regina [name] Date of Exam: 08/05/2020 Medical Rec #:  [id number]         Height:       61.0 in Accession #:    [id number]        Weight:       208.0 lb Date of Birth:  1941/05/13        BSA:          1.921 m Patient Age:    78 years          BP:           140/69 mmHg Patient Gender: F                 HR:           105 bpm. Exam Location:  Inpatient  Procedure: 3D Echo, Transesophageal Echo, Intracardiac Opacification Agent, Cardiac Doppler and Color Doppler  Indications:     ; I48.91* Unspeicified atrial fibrillation  History:         Patient has prior history of Echocardiogram  examinations, most recent 07/01/2020. CHF, Abnormal ECG, Arrythmias:Atrial Fibrillation; Risk Factors:Hypertension and Diabetes. Amyloidosis. LAA thrombus.  Sonographer:     Sheralyn Boatman RDCS Referring Phys:  [id number] Ronnald Ramp O'NEAL Diagnosing Phys: Lennie Odor MD  PROCEDURE: After discussion of the risks and benefits of a TEE, an informed consent was obtained from the patient. TEE procedure time was 20 minutes. The transesophogeal probe was passed without difficulty through the esophogus of the patient. Imaged were obtained with the patient in a left lateral decubitus position. Local oropharyngeal anesthetic was provided with Cetacaine. Sedation performed by different physician. The patient was monitored while under deep sedation. Anesthestetic sedation was provided intravenously by Anesthesiology: 110mg  of Propofol, 80mg  of Lidocaine. Image quality was excellent. The patient's vital signs; including heart rate, blood pressure, and oxygen saturation; remained stable throughout the procedure. The patient developed no complications during the procedure.  IMPRESSIONS   1. There is sludge/thrombus in the LAA. Contrast imaging also demonstrated incomplete filling of the LAA. DCCV was not performed. 3d imaging also demonstrates likely thrombus. Left atrial size was mildly dilated. A left atrial/left atrial appendage thrombus was detected. The LAA emptying velocity was 16 cm/s. 2. Severely reduced LV function, EF 10-15%. LVOT VTI 9.6 cm. CO estimated 3.4 L/min and CI 1.8 L/min/m2. Left ventricular ejection fraction, by estimation, is 10-15%. The left ventricle has severely decreased function. There is severe concentric left ventricular hypertrophy. 3. Right ventricular systolic function is severely reduced. The right ventricular size is mildly enlarged. 4. The mitral valve is grossly normal. Trivial mitral valve regurgitation. No evidence of mitral stenosis. 5. The aortic valve is tricuspid.  Aortic valve regurgitation is not visualized. No aortic stenosis is present.  FINDINGS Left Ventricle: Severely reduced LV function, EF 10-15%. LVOT VTI 9.6 cm. CO estimated 3.4 L/min and CI 1.8 L/min/m2. Left ventricular ejection fraction, by estimation, is 10-15%. The left ventricle has severely decreased function. Definity contrast agent was given IV to delineate the left ventricular endocardial borders. The left ventricular internal cavity size was normal in size. There is severe concentric left ventricular hypertrophy.  Right Ventricle: The right ventricular size is mildly enlarged. No increase in right ventricular wall thickness. Right ventricular systolic function is severely reduced.  Left Atrium: There is sludge/thrombus in the LAA. Contrast imaging also demonstrated incomplete filling of the LAA. DCCV was not performed. 3d imaging also demonstrates likely  thrombus. Left atrial size was mildly dilated. A left atrial/left atrial appendage thrombus was detected. The LAA emptying velocity was 16 cm/s.  Right Atrium: Right atrial size was normal in size.  Pericardium: Trivial pericardial effusion is present.  Mitral Valve: The mitral valve is grossly normal. Trivial mitral valve regurgitation. No evidence of mitral valve stenosis.  Tricuspid Valve: The tricuspid valve is grossly normal. Tricuspid valve regurgitation is mild . No evidence of tricuspid stenosis.  Aortic Valve: The aortic valve is tricuspid. Aortic valve regurgitation is not visualized. No aortic stenosis is present.  Pulmonic Valve: The pulmonic valve was grossly normal. Pulmonic valve regurgitation is trivial. No evidence of pulmonic stenosis.  Aorta: The aortic root and ascending aorta are structurally normal, with no evidence of dilitation.  Venous: The left upper pulmonary vein is normal.  IAS/Shunts: No atrial level shunt detected by color flow Doppler.   LEFT VENTRICLE PLAX 2D LVOT diam:     2.10 cm LV SV:          33 LV SV Index:   17 LVOT Area:     3.46 cm   AORTIC VALVE LVOT Vmax:   62.65 cm/s LVOT Vmean:  43.084 cm/s LVOT VTI:    0.096 m  AORTA Ao Root diam: 3.30 cm Ao Asc diam:  3.07 cm   SHUNTS Systemic VTI:  0.10 m Systemic Diam: 2.10 cm  Lennie Odor MD Electronically signed by Lennie Odor MD Signature Date/Time: 08/05/2020/7:19:37 PM    Final   MONITORS  LONG TERM MONITOR (3-14 DAYS) 03/05/2023  Narrative Patch Wear Time:  7 days and 1 hours (2024-05-07T12:38:42-0400 to 2024-05-14T14:20:42-0400)  1.  Atrial Fibrillation/Flutter occurred continuously (100% burden), ranging from 40-153 bpm (avg of 79 bpm). 2. Two runs of nonsustained Ventricular Tachycardia runs occurred, the run with the fastest interval lasting 4 beats with a max rate of 150 bpm, the longest lasting 5 beats with an avg rate of 132 bpm. 3. Rare PVCs 4. Ventricular Bigeminy and Trigeminy were present.  Arvilla Meres, MD 2:58 PM    CARDIAC MRI  MR CARDIAC MORPHOLOGY W WO CONTRAST 10/24/2020  Narrative CLINICAL DATA:  Cardiac amyloidosis  EXAM: CARDIAC MRI  TECHNIQUE: The patient was scanned on a 1.5 Tesla GE magnet. A dedicated cardiac coil was used. Functional imaging was done using Fiesta sequences. 2,3, and 4 chamber views were done to assess for RWMA's. Modified Simpson's rule using a short axis stack was used to calculate an ejection fraction on a dedicated work [employer]. The patient received 10 cc of Gadavist. After 10 minutes inversion recovery sequences were used to assess for infiltration and scar tissue.  CONTRAST:  Gadavist 10 cc  FINDINGS: Limited images of lungs with increased interstitial markings in the lower lung fields.  Normal left ventricular size with moderate LV hypertrophy. The basal segment were hypokinetic while the apical segments appeared normokinetic, EF 30%. Normal right ventricular size with moderate systolic dysfunction, EF  30%. Mild biatrial enlargement. Mild mitral regurgitation. Trileaflet aortic valve with no stenosis or regurgitation.  On delayed enhancement images, it was difficult to obtain the TI. There was diffuse subendocardial late gadolinium enhancement (LGE) in the mid to apical segments especially. There was subepicardial LGE in the mid anteroseptal and inferoseptal segments. LGE also involved the left and right atrial walls.  Measurements:  LVEDV 172 mL  LVSV 51 mL LVEF 30%  RVEDV 175 mL RVSV 53 mL RVEF 30%  ECV 67%  IMPRESSION: 1. Normal LV  size with moderate LV hypertrophy. The LV is hypokinetic towards the base and more normokinetic towards the apex. EF 30%.  2.  Normal RV size with EF 30%.  3. LGE pattern as well as high extracellular volume percentage (67%) are highly suggestive of cardiac amyloidosis.  Dalton Mclean   Electronically Signed By: Marca Ancona M.D. 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         Laboratory Data:  High Sensitivity Troponin:   Recent Labs  Lab 04/10/23 0449 04/10/23 0628  TROPONINIHS 54* 57*     Chemistry Recent Labs  Lab 04/10/23 0449  NA 134*  K 3.7  CL 96*  CO2 26  GLUCOSE 158*  BUN 55*  CREATININE 2.29*  CALCIUM 9.3  GFRNONAA 21*  ANIONGAP 12    No results for input(s): "PROT", "ALBUMIN", "AST", "ALT", "ALKPHOS", "BILITOT" in the last 168 hours. Lipids No results for input(s): "CHOL", "TRIG", "HDL", "LABVLDL", "LDLCALC", "CHOLHDL" in the last 168 hours.  Hematology Recent Labs  Lab 04/10/23 0449  WBC 5.7  RBC 4.37  HGB 12.9  HCT 40.1  MCV 91.8  MCH 29.5  MCHC 32.2  RDW 15.4  PLT 252   Thyroid No results for input(s): "TSH", "FREET4" in the last 168 hours.  BNPNo results for input(s): "BNP", "PROBNP" in the last 168 hours.  DDimer No results for input(s): "DDIMER" in  the last 168 hours.   Radiology/Studies:  [facility] Chest Port 1 View  Result Date: 04/10/2023 CLINICAL DATA:  Chest pain. EXAM: PORTABLE CHEST 1 VIEW COMPARISON:  February 05, 2023 FINDINGS: The cardiac silhouette is mildly enlarged and unchanged in size. Moderate severity calcification of the aortic arch is seen. Stable, diffuse, chronic appearing increased interstitial lung markings are seen. A stable area of linear scarring is seen within the periphery of the mid left lung. There is no evidence of focal consolidation, pleural effusion or pneumothorax. A right shoulder replacement is noted. Multilevel degenerative changes are seen throughout the thoracic spine. IMPRESSION: Stable cardiomegaly with chronic appearing increased interstitial lung markings. A mild superimposed component of interstitial edema cannot be excluded. Electronically Signed   By: Aram Candela M.D.   On: 04/10/2023 04:54     Assessment and Plan:   Chest Pain  - Patient presented complaining of left shoulder pain which is constant and sharp, suspect this is orthopedic - Also c/o central chest pressure - flat mildly elevated troponin,  hsTn 54, 57, not consistent with ACS. - Creatinine 2.29- note, patient has CKD stage 4. Per Dr. Richrd Sox clinic notes, patient would not be a good candidate for dialysis so it is important to protect the kidneys. She would not be a candidate for cardiac catheterization  - Symptoms improved with nitroglycerin - will start Imdur 30 mg daily.  Hereditary Cardiac Amyloidosis  Chronic HFrEF  - Patient has a known history of hereditary cardiac amyloidosis. Most recent echocardiogram from 02/03/23 showed EF 35-40%, mild LVH, normal RV systolic function  - Patient recently seen by advanced heart failure on 6/7- was volume overloaded on exam and was treated with Furoscix 80 mg SQ for 2 days. Now back on home torsemide 40 mg daily with an extra 40 mg PRN  - Continue Tafamadis and eplonteresen  - Continue  farxiga 10 mg daily  - Continue Toprol-XL 25 mg daily  - Continue entresto 24/26 mg BID  - Note, when  recently seen by Dr. Flora Lipps on 6/17, there were concerns that her disease had progressed. There were also concerns that if her kidney function worsened, she would not be a good candidate for dialysis. Due to concerns for worsening kidney function, I am hesitant to titrate GDMT further  - Appears euvolemic on exam - possible mild interstitial edema on CXR  Persistent Atrial Fibrillation  LAA thrombus  - Patient has had recurrent LAA thrombus despite multiple anticoagulants.  - Continue coumadin therapy- INR 2.5 today (patient failed eliquis in the past)  - Continue metoprolol succinate 25 mg daily  - HR stable and overall well controlled  She has scheduled follow-up with the CHF clinic tomorrow- I've encouraged her to keep that appt.    Case discussed with Dr. Jeraldine Loots- ok to d/c from the ER and provide and Rx to he pharmacy for Imdur 30 mg daily.  For questions or updates, please contact [facility] Please consult [email or web address] for contact info under   Chrystie Nose, MD, Milagros Loll  [place]  [facility]  Medical Director of the Advanced Lipid Disorders &  Cardiovascular Risk Reduction Clinic Diplomate of the American Board of Clinical Lipidology Attending Cardiologist  Direct Dial: [phone]  Fax: [phone]  Website:  [email or web address]

## 2023-04-10 NOTE — ED Provider Notes (Signed)
[place] EMERGENCY DEPARTMENT AT [facility] Provider Note   CSN: [id number] Arrival date & time: 04/10/23  0424     History  Chief Complaint  Patient presents with   Chest Pain    Regina [name] is a 82 y.o. female.  82 year old female brought in by EMS with complaint of chest pain.  Past medical history of cardiac amyloid, CHF, A-fib on Coumadin, diabetes, hypertension.  Patient states that she developed pain across the front of her chest at 9 PM last night, patient reports pain to be constant, aching in nature, waxes and wanes in severity although nothing she does seems to make the pain any better or worse.  Not associated with shortness of breath, nausea, diaphoresis.  Pain radiates up into her shoulders bilaterally.  No history of stents.  Patient was given 4 aspirin and 2 nitro with EMS, states that her pain is maybe slightly better.       Home Medications Prior to Admission medications   Medication Sig Start Date End Date Taking? Authorizing Provider  acetaminophen (TYLENOL) 500 MG tablet Take 1,000 mg by mouth every 6 (six) hours as needed for mild pain.    [provider]  Alcohol Swabs (B-D SINGLE USE SWABS REGULAR) PADS Use to test blood sugar up to 3 times daily 01/11/21   Shamleffer, Konrad Dolores, MD  allopurinol (ZYLOPRIM) 100 MG tablet TAKE ONE TABLET BY MOUTH EVERY MORNING 11/11/22   [place], [name], MD  atorvastatin (LIPITOR) 20 MG tablet TAKE ONE TABLET BY MOUTH EVERYDAY AT BEDTIME 02/21/23   Bensimhon, Bevelyn Buckles, MD  Blood Glucose Monitoring Suppl (ACCU-CHEK GUIDE) w/Device KIT 1 Device by Does not apply route 3 (three) times daily. 09/15/22   Shamleffer, Konrad Dolores, MD  cetirizine (ZYRTEC) 10 MG chewable tablet Chew 10 mg by mouth daily.    [provider]  Cholecalciferol (VITAMIN D3) 50 MCG (2000 UT) capsule Take 2,000 Units by mouth daily.    [provider]  Continuous Blood Gluc Receiver (FREESTYLE LIBRE 2 READER)  DEVI Use as instructed to check blood sugar. 12/12/22   Shamleffer, Konrad Dolores, MD  Continuous Blood Gluc Sensor (FREESTYLE LIBRE 2 SENSOR) MISC Change  sensors every 14 days 12/06/22   Shamleffer, Konrad Dolores, MD  Eplontersen Sodium (WAINUA) 45 MG/0.8ML SOAJ Inject 45 mg into the skin every 30 (thirty) days. 11/28/22   O'NealRonnald Ramp, MD  FARXIGA 10 MG TABS tablet Take 1 tablet (10 mg total) by mouth daily. 02/21/23   Bensimhon, Bevelyn Buckles, MD  Fluocinolone Acetonide 0.01 % OIL instill one drop in ears every 2-3 DAYS AS NEEDED FOR pruritis 04/11/22   [place], [name], MD  gabapentin (NEURONTIN) 600 MG tablet TAKE ONE TABLET BY MOUTH EVERY MORNING and TAKE ONE TABLET BY MOUTH EVERYDAY AT BEDTIME 02/13/23   [place], [name], MD  glucose blood (ACCU-CHEK GUIDE) test strip Use to test blood sugar up to 3 times daily 09/15/22   Shamleffer, Konrad Dolores, MD  insulin glargine, 1 Unit Dial, (TOUJEO SOLOSTAR) 300 UNIT/ML Solostar Pen Inject 22 Units into the skin daily. Eat a snack with protein nightly before bedtime. 08/01/22   Shamleffer, Konrad Dolores, MD  insulin lispro (HUMALOG KWIKPEN) 100 UNIT/ML KwikPen Max daily 30 units 08/01/22   Shamleffer, Konrad Dolores, MD  Insulin Pen Needle 32G X 4 MM MISC 1 Device by Does not apply route in the morning, at noon, in the evening, and at bedtime. 08/01/22   Shamleffer, Konrad Dolores,  MD  ketoconazole (NIZORAL) 2 % shampoo Apply 1 application. topically 2 (two) times a week. 03/21/22   [place], [name], MD  Lancets Misc. (ACCU-CHEK FASTCLIX LANCET) KIT Use to test blood sugar up to 3 times daily 09/15/22   Shamleffer, Konrad Dolores, MD  levothyroxine (SYNTHROID) 150 MCG tablet TAKE ONE TABLET BY MOUTH BEFORE BREAKFAST 10/07/22   [place], [name], MD  magnesium oxide (MAG-OX) 400 MG tablet TAKE ONE TABLET BY MOUTH EVERY MORNING 11/11/22   [place], [name], MD  metoprolol succinate (TOPROL-XL) 25 MG 24 hr tablet TAKE ONE TABLET BY MOUTH ONCE DAILY with OR  immedaitely following A meal 02/21/23   Bensimhon, Bevelyn Buckles, MD  Multiple Vitamin (MULTIVITAMIN) tablet Take 1 tablet by mouth daily. K FREE DAILY once daily (MVI with no vitamin K)    [provider]  nitroGLYCERIN (NITROSTAT) 0.4 MG SL tablet Dissolve 1 tab under tongue as needed for chest pain. May repeat every 5 minutes x 2 doses. If no relief call 9-1-1. 02/13/23   O'Neal, Ronnald Ramp, MD  pantoprazole (PROTONIX) 20 MG tablet Take 1 tablet (20 mg total) by mouth daily. 03/10/23   [place], [name], MD  Polyethylene Glycol 3350 (MIRALAX PO) Take 1 Package by mouth daily as needed (constipation).    [provider]  potassium chloride SA (KLOR-CON M) 20 MEQ tablet Take 1 tablet (20 mEq total) by mouth daily. 03/24/23   [place], Anderson Malta, FNP  Propylene Glycol (SYSTANE BALANCE OP) Place 1 drop into both eyes daily as needed (for dry eyes).    [provider]  sacubitril-valsartan (ENTRESTO) 24-26 MG Take 1 tablet by mouth 2 (two) times daily. 02/21/23   Bensimhon, Bevelyn Buckles, MD  sacubitril-valsartan (ENTRESTO) 24-26 MG Take 1 tablet by mouth 2 (two) times daily. Patient not taking: Reported on 04/03/2023 02/21/23   Bensimhon, Bevelyn Buckles, MD  Tafamidis 61 MG CAPS Take 1 capsule (61 mg total) by mouth daily. 12/06/22   O'NealRonnald Ramp, MD  tirzepatide [facility]) 2.5 MG/0.5ML Pen Inject 2.5 mg into the skin once a week. 01/31/23   Shamleffer, Konrad Dolores, MD  torsemide (DEMADEX) 20 MG tablet Take 2 tablets (40 mg total) by mouth daily. May take an additional 20 mg as needed for swelling 03/03/23   Bensimhon, Bevelyn Buckles, MD  trolamine salicylate (ASPERCREME) 10 % cream Apply 1 application. topically as needed for muscle pain.    [provider]  warfarin (COUMADIN) 5 MG tablet TAKE 1/2 TABLET BY MOUTH daily OR as directed by coumadin clinic 01/09/23   O'Neal, Ronnald Ramp, MD      Allergies    Ace inhibitors, Amlodipine, Atenolol, Avandia [rosiglitazone], Darvon  [propoxyphene], Erythromycin, Hydralazine, Hydrocodone, Levofloxacin, Morphine and codeine, Percocet [oxycodone-acetaminophen], Spironolactone, and Tramadol    Review of Systems   Review of Systems Negative except as per HPI Physical Exam Updated Vital Signs BP 120/65 (BP Location: Right Arm)   Pulse 83   Temp 97.8 F (36.6 C) (Oral)   Resp 18   SpO2 99%  Physical Exam Vitals and nursing note reviewed.  Constitutional:      General: She is not in acute distress.    Appearance: She is well-developed. She is not diaphoretic.  HENT:     Head: Normocephalic and atraumatic.  Cardiovascular:     Heart sounds: Normal heart sounds.  Pulmonary:     Effort: Pulmonary effort is normal.  Musculoskeletal:     Right lower leg: No tenderness. No edema.  Left lower leg: No tenderness. No edema.  Skin:    General: Skin is warm and dry.     Findings: No erythema or rash.  Neurological:     Mental Status: She is alert and oriented to person, place, and time.  Psychiatric:        Behavior: Behavior normal.     ED Results / Procedures / Treatments   Labs (all labs ordered are listed, but only abnormal results are displayed) Labs Reviewed  BASIC METABOLIC PANEL - Abnormal; Notable for the following components:      Result Value   Sodium 134 (*)    Chloride 96 (*)    Glucose, Bld 158 (*)    BUN 55 (*)    Creatinine, Ser 2.29 (*)    GFR, Estimated 21 (*)    All other components within normal limits  PROTIME-INR - Abnormal; Notable for the following components:   Prothrombin Time 27.3 (*)    INR 2.5 (*)    All other components within normal limits  TROPONIN I (HIGH SENSITIVITY) - Abnormal; Notable for the following components:   Troponin I (High Sensitivity) 54 (*)    All other components within normal limits  CBC  TROPONIN I (HIGH SENSITIVITY)    EKG EKG Interpretation  Date/Time:  Monday April 10 2023 04:43:14 EDT Ventricular Rate:  85 PR Interval:    QRS  Duration: 95 QT Interval:  360 QTC Calculation: 428 R Axis:   35 Text Interpretation: Atrial flutter Low voltage, precordial leads Borderline repolarization abnormality No significant change since last tracing Confirmed by Melene Plan [phone]) on 04/10/2023 5:16:41 AM  Radiology DG Chest Port 1 View  Result Date: 04/10/2023 CLINICAL DATA:  Chest pain. EXAM: PORTABLE CHEST 1 VIEW COMPARISON:  February 05, 2023 FINDINGS: The cardiac silhouette is mildly enlarged and unchanged in size. Moderate severity calcification of the aortic arch is seen. Stable, diffuse, chronic appearing increased interstitial lung markings are seen. A stable area of linear scarring is seen within the periphery of the mid left lung. There is no evidence of focal consolidation, pleural effusion or pneumothorax. A right shoulder replacement is noted. Multilevel degenerative changes are seen throughout the thoracic spine. IMPRESSION: Stable cardiomegaly with chronic appearing increased interstitial lung markings. A mild superimposed component of interstitial edema cannot be excluded. Electronically Signed   By: Aram Candela M.D.   On: 04/10/2023 04:54    Procedures Procedures    Medications Ordered in ED Medications - No data to display  ED Course/ Medical Decision Making/ A&P                             Medical Decision Making Amount and/or Complexity of Data Reviewed Labs: ordered. Radiology: ordered.   This patient presents to the ED for concern of CP, this involves an extensive number of treatment options, and is a complaint that carries with it a high risk of complications and morbidity.  The differential diagnosis includes but not limited to arrhythmia, ACS, angina   Co morbidities that complicate the patient evaluation  CHF, diabetes, hypertension, CAD, cardiac amyloid   Additional history obtained:  External records from outside source obtained and reviewed including recent visit to cardiology, Dr.  Bufford Buttner dated 04/03/2023 who notes patient has been referred to advanced heart failure clinic with medications adjusted.  History of hereditary cardiac amyloidosis, chronic systolic congestive heart failure, persistent A-fib, left atrial thrombus and stage IV CKD.  Lab Tests:  I Ordered, and personally interpreted labs.  The pertinent results include: INR therapeutic at 2.5.  Initial troponin is 54, similar to prior on file.  BMP without significant changes compared to prior on file.  CBC within normal limits.   Imaging Studies ordered:  I ordered imaging studies including CXR  I independently visualized and interpreted imaging which showed cardiomegaly  I agree with the radiologist interpretation   Cardiac Monitoring: / EKG:  The patient was maintained on a cardiac monitor.  I personally viewed and interpreted the cardiac monitored which showed an underlying rhythm of: A-fib, rate 85   Consultations Obtained:  I requested consultation with the Dr. Rennis Golden,  and discussed lab and imaging findings as well as pertinent plan - they recommend: cardiology to see   Problem List / ED Course / Critical interventions / Medication management  82 year old female with cardiac history as reviewed above and in chart.  Presents with chest pain and pressure onset 9 PM last night.  Unable to sleep all night, called EMS this morning was transported to the ER, provided with aspirin and nitro and route.  Patient reports some improvement in her discomfort at time of recheck this morning.  Patient with significant cardiac risk factors, her troponin and EKG are without significant findings.  Case was discussed with Dr. Rennis Golden with cardiology.  Cardiology team to round on patient this morning. CP somewhat improved with nitroglycerin and ASA from EMS. Persistent left shoulder discomfort  I have reviewed the patients home medicines and have made adjustments as needed   Social Determinants of Health:  Lives at  home, sees advanced heart failure clinic   Test / Admission - Considered:  Disposition pending cardiology evaluation.         Final Clinical Impression(s) / ED Diagnoses Final diagnoses:  Chest pain, unspecified type    Rx / DC Orders ED Discharge Orders     None         Jeannie Fend, PA-C 04/10/23 0617    Melene Plan, DO 04/10/23 [phone]

## 2023-04-10 NOTE — Telephone Encounter (Signed)
Called and no answer. Left message for [facility] to return my call to confirm appointment for tomorrow at HF clinic and for me to assist with visit there or in the home this week. I will follow up.   Maralyn Sago, EMT-Paramedic [phone] 04/10/2023

## 2023-04-10 NOTE — ED Provider Notes (Signed)
9:02 AM Patient seen, evaluated by cardiology designated as appropriate for follow-up which is scheduled for tomorrow.  Medications changed per cardiology.   Regina Munch, MD 04/10/23 [phone]

## 2023-04-11 ENCOUNTER — Encounter (HOSPITAL_COMMUNITY): Payer: Self-pay

## 2023-04-11 ENCOUNTER — Other Ambulatory Visit (HOSPITAL_COMMUNITY): Payer: Self-pay

## 2023-04-11 ENCOUNTER — Ambulatory Visit (HOSPITAL_COMMUNITY)
Admission: RE | Admit: 2023-04-11 | Discharge: 2023-04-11 | Disposition: A | Discharge status: Home / Self Care | Payer: Medicare Other | Source: Ambulatory Visit | Attending: Family Medicine | Admitting: Family Medicine

## 2023-04-11 VITALS — BP 110/58 | HR 81 | Wt 230.4 lb

## 2023-04-11 DIAGNOSIS — I4819 Other persistent atrial fibrillation: Secondary | ICD-10-CM | POA: Diagnosis not present

## 2023-04-11 DIAGNOSIS — E1140 Type 2 diabetes mellitus with diabetic neuropathy, unspecified: Secondary | ICD-10-CM | POA: Diagnosis not present

## 2023-04-11 DIAGNOSIS — I43 Cardiomyopathy in diseases classified elsewhere: Secondary | ICD-10-CM | POA: Diagnosis not present

## 2023-04-11 DIAGNOSIS — I4821 Permanent atrial fibrillation: Secondary | ICD-10-CM | POA: Diagnosis not present

## 2023-04-11 DIAGNOSIS — I130 Hypertensive heart and chronic kidney disease with heart failure and stage 1 through stage 4 chronic kidney disease, or unspecified chronic kidney disease: Secondary | ICD-10-CM | POA: Diagnosis not present

## 2023-04-11 DIAGNOSIS — E1122 Type 2 diabetes mellitus with diabetic chronic kidney disease: Secondary | ICD-10-CM | POA: Insufficient documentation

## 2023-04-11 DIAGNOSIS — Z7984 Long term (current) use of oral hypoglycemic drugs: Secondary | ICD-10-CM | POA: Diagnosis not present

## 2023-04-11 DIAGNOSIS — E854 Organ-limited amyloidosis: Secondary | ICD-10-CM | POA: Diagnosis not present

## 2023-04-11 DIAGNOSIS — R0602 Shortness of breath: Secondary | ICD-10-CM | POA: Diagnosis not present

## 2023-04-11 DIAGNOSIS — I2510 Atherosclerotic heart disease of native coronary artery without angina pectoris: Secondary | ICD-10-CM | POA: Diagnosis not present

## 2023-04-11 DIAGNOSIS — Z7901 Long term (current) use of anticoagulants: Secondary | ICD-10-CM | POA: Insufficient documentation

## 2023-04-11 DIAGNOSIS — N184 Chronic kidney disease, stage 4 (severe): Secondary | ICD-10-CM | POA: Diagnosis not present

## 2023-04-11 DIAGNOSIS — I5022 Chronic systolic (congestive) heart failure: Secondary | ICD-10-CM | POA: Insufficient documentation

## 2023-04-11 DIAGNOSIS — Z794 Long term (current) use of insulin: Secondary | ICD-10-CM | POA: Insufficient documentation

## 2023-04-11 DIAGNOSIS — Z79899 Other long term (current) drug therapy: Secondary | ICD-10-CM | POA: Insufficient documentation

## 2023-04-11 DIAGNOSIS — G629 Polyneuropathy, unspecified: Secondary | ICD-10-CM

## 2023-04-11 LAB — BASIC METABOLIC PANEL
Anion gap: 12 (ref 5–15)
BUN: 55 mg/dL — ABNORMAL HIGH (ref 8–23)
CO2: 28 mmol/L (ref 22–32)
Calcium: 9.2 mg/dL (ref 8.9–10.3)
Chloride: 96 mmol/L — ABNORMAL LOW (ref 98–111)
Creatinine, Ser: 2.21 mg/dL — ABNORMAL HIGH (ref 0.44–1.00)
GFR, Estimated: 22 mL/min — ABNORMAL LOW (ref >60)
Glucose, Bld: 133 mg/dL — ABNORMAL HIGH (ref 70–99)
Potassium: 3.8 mmol/L (ref 3.5–5.1)
Sodium: 136 mmol/L (ref 135–145)

## 2023-04-11 LAB — BRAIN NATRIURETIC PEPTIDE: B Natriuretic Peptide: 640.6 pg/mL — ABNORMAL HIGH (ref 0.0–100.0)

## 2023-04-11 MED ORDER — POTASSIUM CHLORIDE CRYS ER 20 MEQ PO TBCR: 40.0000 meq | EXTENDED_RELEASE_TABLET | Freq: Every day | ORAL | 6 refills | Status: DC

## 2023-04-11 MED ORDER — TORSEMIDE 20 MG PO TABS: 80.0000 mg | ORAL_TABLET | Freq: Every day | ORAL | 6 refills | Status: DC

## 2023-04-11 NOTE — Progress Notes (Signed)
Paramedicine Encounter    Patient ID: Regina [name], female    DOB: 11/16/1940, 82 y.o.   MRN: [id number]  Met with Ravon in the clinic today where she was accompanied by her daughter. She reports feeling okay but concerned with the chest pain she had resulting in her ER visit and a small episode of same earlier today. Vitals obtained by nurse and meds reviewed. NP Prince Rome came in and consulted with patient and we reviewed meds and length.   Today her weight in clinic was- 230lbs  REDS CLIP- 43% (suggestive of fluid)   Shanda Bumps made the following changes today:  INCREASE TORSEMIDE TO 80MG  IN THE AM  INCREASE POTASSIUM TO IN THE AM   Today [name] brought her pillpacks from [facility] and her new bottle of isosorbide that EDP placed her on. I deconstructed her pill packs to create 4 days of medications in a pill box with updated med changes including- Removing aspirin Adding Torsemide 80mg   Adding Potassium  Adding Isosorbide 30mg    (She does not have Entresto, Tafamidis or Warfarin in pill box- she has bottle at home and plans to take out of bottle until Dede arrives in the home)    -Shanda Bumps is concerned with BP and wants a close follow up to ensure her BP's aren't too soft. Will have Dede follow up on Friday.   I called [facility] to notify them that we will stop pill packs for now and all refills must be in bottles due to frequent med changes- [name] agreed with this plan.   She had labs today and will need repeat next week on Tuesday which have already been scheduled while in clinic. She will also follow up in clinic in 3-4 weeks with APP.   Clinic visit complete. I will make Dede aware of all of this and have her follow up Friday in the home. Patient and NP agreeable.    Maralyn Sago, EMT-Paramedic [phone] 04/11/2023    ACTION: Home visit completed

## 2023-04-11 NOTE — Patient Instructions (Addendum)
Thank you for coming in today  If you had labs drawn today, any labs that are abnormal the clinic will call you No news is good news  Medications: Increase torsemide to 80 mg daily Increase Potassium to 40 mew daily  Follow up appointments: Your physician recommends that you return for lab work in: BMET in 7 days  Your physician recommends that you schedule a follow-up appointment in:  3-4 weeks in clinic bring blood pressure cuff to next appointment  3 months With Dr. Gala Romney  You will receive a reminder letter in the mail a few months in advance. If you don't receive a letter, please call our office to schedule the follow-up appointment.    Do the following things EVERYDAY: Weigh yourself in the morning before breakfast. Write it down and keep it in a log. Take your medicines as prescribed Eat low salt foods--Limit salt (sodium) to 2000 mg per day.  Stay as active as you can everyday Limit all fluids for the day to less than 2 liters   At the [facility], you and your health needs are our priority. As part of our continuing mission to provide you with exceptional heart care, we have created designated Provider Care Teams. These Care Teams include your primary Cardiologist (physician) and Advanced Practice Providers (APPs- Physician Assistants and Nurse Practitioners) who all work together to provide you with the care you need, when you need it.   You may see any of the following providers on your designated Care Team at your next follow up: Dr Arvilla Meres Dr Marca Ancona Dr. Marcos Eke, NP Robbie Lis, [place] [facility], [place] Brynda Peon, NP Karle Plumber, PharmD   Please be sure to bring in all your medications bottles to every appointment.    Thank you for choosing [facility]  If you have any questions or concerns before your next appointment please send [place] a message  through [place] or call our office at [phone].    TO LEAVE A MESSAGE FOR THE NURSE SELECT OPTION 2, PLEASE LEAVE A MESSAGE INCLUDING: YOUR NAME DATE OF BIRTH CALL BACK NUMBER REASON FOR CALL**this is important as we prioritize the call backs  YOU WILL RECEIVE A CALL BACK THE SAME DAY AS LONG AS YOU CALL BEFORE 4:00 PM

## 2023-04-11 NOTE — Progress Notes (Signed)
ADVANCED HF CLINIC NOTE  Primary Care: [place], [name], MD Primary Cardiologist: Reatha Harps, MD HF Cardiologist: Dr. Gala Romney  HPI: Regina [name] is a 82 y.o.[place] woman with PMHx as below. Referred by Dr. Flora Lipps for further evaluation of he hATTR cardiac amyloidosis   Problem List 1.  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 Chronic AF -LAA sludge vs thrombus 05/05/2020  -persistent sludge 07/01/2020 -LAA thrombus persistent 08/05/2020 -on warfarin 3. DM -A1c 7.3 4. HTN 5. Non-obstructive CAD -LHC 01/19/2015 [name] -50% mid RCA and 50% LAD -MPI 08/14/2020 -> normal 6. OSA 7. CKD 4 (b/l Scr ~2.0) 8. CVA -11/2020 @ [facility]  9. Polymyalgia Rheumatica  10. Morbid obesity  Currently on tafamadis (started 2021) and eplonteresen (started 2/24). Seen in f/u by Dr. Flora Lipps on 01/06/2023 with volume overload He increased her torsemide to 40/20  Echo repeated 02/03/2023 LVEF 40% with normal pressures.   She was seen in the ER 02/06/2023 for shortness of breath.  She was given IV Lasix with good result and discharged without admission.  She was seen by Bettina Gavia, PA on 02/09/23. Weight up 10 pounds despite mistakenly taking 120 mg torsemide daily. Labs with stable Scr 2.01  Initial visit with Dr. Gala Romney 5/24, NYHA III and volume up. Entresto started and given Furoscix for PRN home use with paramedicine support.  Follow up 03/24/23, remained volume overloaded with NYHA IIIb. Instructed to use Furoscix x 3 days. Saw Dr. Flora Lipps 04/03/23, volume up a bit but overall stable, advised to take her daily dose of diuretic (she had held it that day). Seen in ED 04/11/23 with CP. HsTroponin 54>>57. No ECG changes, symptoms improved with nitroglycerin, she was started on Imdur 30 mg  daily and felt stable for discharge home with close HF follow up.   Today she returns for HF follow up with her daughter and paramedicine. Overall feeling fair. She has SOB walking on flat ground or walking up steps. She has ankle swelling. Denies palpitations, CP, dizziness, or PND/Orthopnea. Appetite ok. No fever or chills. Weight at home 226 pounds. Taking all medications. Wears CPAP. BP at home 89/60 this morning, improved by afternoon, asymptomatic. Weight went down 229-->224 lbs after using 2 doses of Furoscix. Daughter asking is disease processes are progressing.  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 01/2015 Cardiolite: + inf wall ischemia, EF 56%;  b. 01/2015 Cath: LM nl, LAD 55m, LCX min irregs, RCA dominant, 50-67m.   Sleep apnea  Swallowing difficulty    Current Outpatient Medications  Medication Sig Dispense Refill   acetaminophen (TYLENOL) 500 MG tablet Take 1,000 mg by mouth every 6 (six) hours as needed for mild pain.     Alcohol Swabs (B-D SINGLE USE SWABS REGULAR) PADS Use to test blood sugar up to 3 times daily 100 each 3   allopurinol (ZYLOPRIM) 100 MG tablet TAKE ONE TABLET BY MOUTH EVERY MORNING (Patient taking differently: Take 100 mg by mouth.) 90 tablet 1   atorvastatin (LIPITOR) 20 MG tablet TAKE ONE TABLET BY MOUTH EVERYDAY AT BEDTIME (Patient taking differently: Take 20 mg by  mouth at bedtime.) 90 tablet 3   Blood Glucose Monitoring Suppl (ACCU-CHEK GUIDE) w/Device KIT 1 Device by Does not apply route 3 (three) times daily. 1 kit 0   cetirizine (ZYRTEC) 10 MG chewable tablet Chew 10 mg by mouth daily.     Continuous Blood Gluc Receiver (FREESTYLE LIBRE 2 READER) DEVI Use as instructed to check blood sugar. 1 each 0   Continuous Blood Gluc Sensor (FREESTYLE LIBRE 2 SENSOR) MISC Change  sensors every 14 days 6 each 3   Eplontersen Sodium (WAINUA) 45 MG/0.8ML SOAJ Inject 45 mg into the skin every 30 (thirty) days. 0.8 mL 12   FARXIGA 10 MG TABS tablet Take 1 tablet (10 mg total) by mouth daily. 90 tablet 3   Fluocinolone Acetonide 0.01 % OIL instill one drop in ears every 2-3 DAYS AS NEEDED FOR pruritis 20 mL 1   gabapentin (NEURONTIN) 600 MG tablet TAKE ONE TABLET BY MOUTH EVERY MORNING and TAKE ONE TABLET BY MOUTH EVERYDAY AT BEDTIME (Patient taking differently: Take 600 mg by mouth 2 (two) times daily.) 60 tablet 3   glucose blood (ACCU-CHEK GUIDE) test strip Use to test blood sugar up to 3 times daily 100 each 3   insulin glargine, 1 Unit Dial, (TOUJEO SOLOSTAR) 300 UNIT/ML Solostar Pen Inject 22 Units into the skin daily. Eat a snack with protein nightly before bedtime. 15 mL 6   insulin lispro (HUMALOG KWIKPEN) 100 UNIT/ML KwikPen Max daily 30 units (Patient taking differently: Inject 4-8 Units into the skin daily as needed. Sliding scale: Max daily 30 units) 30 mL 3   Insulin Pen Needle 32G X 4 MM MISC 1 Device by Does not apply route in the morning, at noon, in the evening, and at bedtime. 400 each 3   isosorbide mononitrate (IMDUR) 30 MG 24 hr tablet Take 1 tablet (30 mg total) by mouth daily. 30 tablet 0   ketoconazole (NIZORAL) 2 % shampoo Apply 1 application. topically 2 (two) times a week. (Patient taking differently: Apply 1 application  topically daily as needed for irritation.) 120 mL 2   Lancets Misc. (ACCU-CHEK FASTCLIX LANCET) KIT Use to test blood sugar up  to 3 times daily 1 kit 1   levothyroxine (SYNTHROID) 150 MCG tablet TAKE ONE TABLET BY MOUTH BEFORE BREAKFAST (Patient taking differently: Take 150 mcg by mouth daily before breakfast.) 90 tablet 3   magnesium oxide (MAG-OX) 400 MG tablet TAKE ONE TABLET BY MOUTH EVERY MORNING 90 tablet 1   metoprolol succinate (TOPROL-XL) 25 MG 24 hr tablet TAKE ONE TABLET BY MOUTH ONCE DAILY with OR immedaitely following A meal (Patient taking differently: Take 25 mg by mouth daily. TAKE ONE TABLET BY MOUTH ONCE DAILY with OR immedaitely following A meal) 90 tablet 3   Multiple Vitamin (MULTIVITAMIN) tablet Take 1 tablet by mouth daily. K FREE DAILY once daily (MVI with  no vitamin K)     Multiple Vitamins-Minerals (ONE-A-DAY WOMENS PO) Take 1 tablet by mouth daily.     nitroGLYCERIN (NITROSTAT) 0.4 MG SL tablet Dissolve 1 tab under tongue as needed for chest pain. May repeat every 5 minutes x 2 doses. If no relief call 9-1-1. 75 tablet 2   pantoprazole (PROTONIX) 20 MG tablet Take 1 tablet (20 mg total) by mouth daily. 30 tablet 2   Polyethylene Glycol 3350 (MIRALAX PO) Take 1 Package by mouth daily as needed (constipation).     potassium chloride SA (KLOR-CON M) 20 MEQ tablet Take 1 tablet (20 mEq total) by mouth daily. 30 tablet 0   Propylene Glycol (SYSTANE BALANCE OP) Place 1 drop into both eyes daily as needed (for dry eyes).     sacubitril-valsartan (ENTRESTO) 24-26 MG Take 1 tablet by mouth 2 (two) times daily. 90 tablet 3   Tafamidis 61 MG CAPS Take 1 capsule (61 mg total) by mouth daily. 30 capsule 12   tirzepatide (MOUNJARO) 2.5 MG/0.5ML Pen Inject 2.5 mg into the skin once a week. (Patient taking differently: Inject 2.5 mg into the skin once a week. Monday) 6 mL 3   torsemide (DEMADEX) 20 MG tablet Take 2 tablets (40 mg total) by mouth daily. May take an additional 20 mg as needed for swelling 180 tablet 3   trolamine salicylate (ASPERCREME) 10 % cream Apply 1 application. topically as needed for muscle  pain.     warfarin (COUMADIN) 5 MG tablet TAKE 1/2 TABLET BY MOUTH daily OR as directed by coumadin clinic (Patient taking differently: Take 2.5 mg by mouth at bedtime. TAKE 1/2 TABLET BY MOUTH daily OR as directed by coumadin clinic) 50 tablet 1   No current facility-administered medications for this encounter.   Allergies  Allergen Reactions   Ace Inhibitors Other (See Comments)    Unknown reaction   Amlodipine Other (See Comments)    Unknown reaction   Atenolol Other (See Comments)    bradycardia   Avandia [Rosiglitazone] Other (See Comments)    Unknown reaction   Darvon [Propoxyphene] Other (See Comments)    Hallucinations    Erythromycin Itching   Hydralazine Other (See Comments)    Burning in throat and chest   Hydrocodone Other (See Comments)    Hallucinations.   Levofloxacin Itching   Morphine And Codeine Other (See Comments)    Dizzy and hallucianation, vomiting; Willing to try low dose   Percocet [Oxycodone-Acetaminophen] Other (See Comments)    hallucination   Spironolactone Other (See Comments)    Unknown reaction   Tramadol Other (See Comments)    Unknown/does not recall reaction but does not want to take again   Social History   Socioeconomic History   Marital status: Widowed    Spouse name: Not on file   Number of children: Not on file   Years of education: Not on file   Highest education level: Not on file  Occupational History   Occupation: Retired  Tobacco Use   Smoking status: Never   Smokeless tobacco: Never  Vaping Use   Vaping Use: Never used  Substance and Sexual Activity   Alcohol use: No   Drug use: Never   Sexual activity: Not Currently  Other Topics Concern   Not on file  Social History Narrative   Lives with daughter   Social Determinants of Health   Financial Resource Strain: Low Risk  (02/20/2023)   Overall Financial Resource Strain (CARDIA)    Difficulty  of Paying Living Expenses: Not hard at all  Food Insecurity: No Food  Insecurity (03/22/2023)   Hunger Vital Sign    Worried About Running Out of Food in the Last Year: Never true    Ran Out of Food in the Last Year: Never true  Transportation Needs: No Transportation Needs (02/20/2023)   PRAPARE - Administrator, Civil Service (Medical): No    Lack of Transportation (Non-Medical): No  Physical Activity: Inactive (02/20/2023)   Exercise Vital Sign    Days of Exercise per Week: 0 days    Minutes of Exercise per Session: 0 min  Stress: No Stress Concern Present (02/20/2023)   [employer] of Occupational Health - Occupational Stress Questionnaire    Feeling of Stress : Not at all  Social Connections: Moderately Integrated (02/20/2023)   Social Connection and Isolation Panel [NHANES]    Frequency of Communication with Friends and Family: More than three times a week    Frequency of Social Gatherings with Friends and Family: More than three times a week    Attends Religious Services: More than 4 times per year    Active Member of [employer] or Organizations: Yes    Attends Banker Meetings: More than 4 times per year    Marital Status: Widowed  Intimate Partner Violence: Not At Risk (02/20/2023)   Humiliation, Afraid, Rape, and Kick questionnaire    Fear of Current or Ex-Partner: No    Emotionally Abused: No    Physically Abused: No    Sexually Abused: No   Family History  Problem Relation Age of Onset   Heart disease Mother    Hypertension Mother    Cancer Father    Alcoholism Father    BP (!) 110/58 (BP Location: Left Arm, Patient Position: Sitting)   Pulse 81   Wt 104.5 kg (230 lb 6.4 oz)   SpO2 97%   BMI 42.14 kg/m   Wt Readings from Last 3 Encounters:  04/11/23 104.5 kg (230 lb 6.4 oz)  04/03/23 105.6 kg (232 lb 12.8 oz)  03/31/23 101.7 kg (224 lb 4.8 oz)    PHYSICAL EXAM: General:  NAD. No resp difficulty, walked into clinic, elderly HEENT: Normal Neck: Supple. JVP 10. Carotids 2+ bilat; no bruits. No lymphadenopathy or  thryomegaly appreciated. Cor: PMI nondisplaced. Irregular rate & rhythm. No rubs, gallops or murmurs. Lungs: Clear, diminished in bases Abdomen: Soft, obese, nontender, nondistended. No hepatosplenomegaly. No bruits or masses. Good bowel sounds. Extremities: No cyanosis, clubbing, rash, 1+ BLE edema Neuro: Alert & oriented x 3, cranial nerves grossly intact. Moves all 4 extremities w/o difficulty. Affect pleasant.  ReDs: 40%-->43% today    ECG (personally reviewed from 04/11/23): atrial fibrillation 85 bpm  ASSESSMENT & PLAN: 1. 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 Volume up. ReDS 43%. Volume status very labile.  - Increase torsemide to 80 mg daily + increase KCL to 40 daily. - Continue Tafamadis and eplonteresen - Continue Farxiga 10 mg daily. - Continue Toprol XL 25 mg daily. - Continue Entresto 24/26 mg bid. Need to watch renal function closely.  - Continue Paramedicine support - Labs today. Repeat BMET in 10 days  - She has Furoscix at home  for PRN use, paramedicine will follow up on Friday. - Discussed trajectory of cardiac amyloidosis and worsening renal function with volume overload suggests disease progression. Reiterated that we will continue to treat the treatable and aim to keep her feeling as good as she can for as long as we can.  2. CKD 4 - Baseline Scr 2.0 - Watch closely. - Continue SGLT2i. - Most recent SCr 2.29, not a good dialysis candidate. - Labs today   3. Coronary artery disease  - Nonobstructive CAD in the past.   - No ASA as she is on warfarin  - Seen in ED yesterday with CP, HsT flat. Felt MSK in nature - ? If recent CP 2/2 volume overload - Now on Imdur 30 mg daily, continue. If BP drops, would decrease to 15 mg  daily. - Continue statin - Followed by Dr. Flora Lipps   4. Permanent AF - Rate mildly elevated here. - Continue warfarin - Previously on Eliquis and Xarelto but switch to warfarin due to persistence of LAA clot - Zio 7 day (5/24) showed 100% AFL burden, HR 40-153 - If rates remain elevated, may be worth trial of DC-CV with amio support, but will defer for now - HR 81 today.  5. Obesity  - Body mass index is 42.14 kg/m. - on tirzepatide   6. 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 Gala Romney.   Jacklynn Ganong, FNP  3:41 PM

## 2023-04-12 ENCOUNTER — Telehealth (HOSPITAL_COMMUNITY): Payer: Self-pay | Admitting: Licensed Clinical Social Worker

## 2023-04-12 NOTE — Telephone Encounter (Signed)
HF Paramedicine Team Based Care Meeting  One Month Post Enrollment Assessment  HF MD- NA  HF NP - Amy Clegg NP-C   [facility] HF Paramedicine  Katie Vicente Males  [facility] admit within the last 30 days for heart failure? no  Medications concerns? Working on medications- was getting [facility] but lots of med changes  Transportation issues ? no  Education needs? no  SDOH concerns? Great family support  [facility] Referrals Placed:n/a  Barriers to discharge? Still acute symptoms- had furosix      Burna Sis, LCSW Clinical Social Worker [facility] Desk#: [phone] Cell#: [phone]

## 2023-04-14 ENCOUNTER — Other Ambulatory Visit (HOSPITAL_COMMUNITY): Payer: Self-pay | Admitting: Emergency Medicine

## 2023-04-14 NOTE — Progress Notes (Signed)
Paramedicine Encounter    Patient ID: Regina [name], female    DOB: 02-Mar-1941, 82 y.o.   MRN: [id number]   Complaints NONE  Assessmen A&O x 4, skin W&D w/ good color.  No edema noted.  Lung sounds clear and equal bilat.  Compliance with meds 100%  Pill box filled  Friday thru Monday  Refills needed NONE  Meds changes since last visit added Isosorbide increased Torsemide 80mg , Potassium   Social changes none   BP 110/60 (BP Location: Left Arm, Patient Position: Sitting, Cuff Size: Normal)   Pulse 88   Resp 16   Wt 224 lb 8 oz (101.8 kg)   BMI 41.06 kg/m  Weight yesterday-Not taken Last visit weight-224lb  Home visit with Regina [name] today and finds her feeling well.  She has been compliant with all meds.  [name] helped her deconstruct her pill packs to reflect med changes.  Today we put together Sun & Monday pill regimens as [facility] is to deliver her refills in bottles on Tuesday.    ACTION: Home visit completed  Bethanie Dicker [phone] 04/14/23  Patient Care Team: [place], [name], MD as PCP - General (Family Medicine) O'Neal, Ronnald Ramp, MD as PCP - Cardiology (Internal Medicine) Leitha Bleak Milas Kocher, [facility] (Inactive) (Pharmacist)  Patient Active Problem List   Diagnosis Date Noted  . Acquired thrombophilia (HCC) 02/10/2023  . Multiple lipomas 10/04/2022  . Dizziness, nonspecific 09/22/2022  . Dysphagia 09/22/2022  . Left shoulder pain 09/22/2022  . Pain due to onychomycosis of toenail of left foot 02/11/2022  . Chest pain 12/26/2021  . Left atrial thrombus 12/25/2021  . Wild-type transthyretin-related (ATTR) amyloidosis (HCC)   . Lung nodules 12/13/2021  . Polymyalgia rheumatica (HCC) 06/30/2021  . Neck pain 06/18/2021  . Gait abnormality 06/18/2021  . Memory loss 06/18/2021  . Atherosclerosis of aorta (HCC) 06/09/2021  . Type 2 diabetes mellitus with stage 3a chronic kidney disease, with long-term current use of insulin (HCC)  09/14/2020  . Type 2 diabetes mellitus with retinopathy, with long-term current use of insulin (HCC) 09/14/2020  . Diabetes mellitus (HCC) 09/14/2020  . Type 2 diabetes mellitus with diabetic polyneuropathy, with long-term current use of insulin (HCC) 09/14/2020  . Long term (current) use of anticoagulants 07/13/2020  . Persistent atrial fibrillation (HCC)   . Mixed hyperlipidemia   . Acute on chronic diastolic CHF (congestive heart failure), NYHA class 3 (HCC)   . Demand ischemia   . Atrial fibrillation (HCC) 04/24/2020  . Other fatigue 03/24/2020  . Short of breath on exertion 03/24/2020  . Congestive heart failure (HCC) 03/24/2020  . Vitamin D deficiency 03/24/2020  . Sleep apnea 05/21/2019  . History of hepatitis C 05/21/2019  . Insomnia 05/21/2019  . CKD (chronic kidney disease) stage 4, GFR 15-29 ml/min (HCC) 05/21/2019  . GERD (gastroesophageal reflux disease) 04/20/2018  . Type 2 diabetes mellitus with diabetic neuropathy, unspecified (HCC) 12/19/2016  . History of TIA (transient ischemic attack) 12/19/2016  . Hyperlipidemia associated with type 2 diabetes mellitus (HCC) 12/19/2016  . S/P total knee replacement using cement, right 03/18/2015  . Non-obstructive CAD   . Cardiovascular stress test abnormal   . Pleuritic chest pain 01/16/2015  . Acute renal failure superimposed on stage 3 chronic kidney disease (HCC) 01/16/2015  . Obesity, Class III, BMI 40-49.9 (morbid obesity) (HCC) 01/16/2015  . Essential hypertension 01/16/2015  . Hypothyroidism 01/16/2015  . OSA on CPAP 01/16/2015  . DM type 2 (diabetes mellitus, type 2) (HCC)  01/16/2015  . Coronary artery disease involving native coronary artery without angina pectoris 10/02/2014  . Physical deconditioning 02/28/2013  . Morbid obesity (HCC) 02/27/2013  . Osteoarthritis 02/27/2013    Current Outpatient Medications:  .  acetaminophen (TYLENOL) 500 MG tablet, Take 1,000 mg by mouth every 6 (six) hours as needed for mild  pain., Disp: , Rfl:  .  Alcohol Swabs (B-D SINGLE USE SWABS REGULAR) PADS, Use to test blood sugar up to 3 times daily, Disp: 100 each, Rfl: 3 .  allopurinol (ZYLOPRIM) 100 MG tablet, TAKE ONE TABLET BY MOUTH EVERY MORNING (Patient taking differently: Take 100 mg by mouth.), Disp: 90 tablet, Rfl: 1 .  atorvastatin (LIPITOR) 20 MG tablet, TAKE ONE TABLET BY MOUTH EVERYDAY AT BEDTIME (Patient taking differently: Take 20 mg by mouth at bedtime.), Disp: 90 tablet, Rfl: 3 .  Blood Glucose Monitoring Suppl (ACCU-CHEK GUIDE) w/Device KIT, 1 Device by Does not apply route 3 (three) times daily., Disp: 1 kit, Rfl: 0 .  cetirizine (ZYRTEC) 10 MG chewable tablet, Chew 10 mg by mouth daily., Disp: , Rfl:  .  Continuous Blood Gluc Receiver (FREESTYLE LIBRE 2 READER) DEVI, Use as instructed to check blood sugar., Disp: 1 each, Rfl: 0 .  Continuous Blood Gluc Sensor (FREESTYLE LIBRE 2 SENSOR) MISC, Change  sensors every 14 days, Disp: 6 each, Rfl: 3 .  Eplontersen Sodium (WAINUA) 45 MG/0.8ML SOAJ, Inject 45 mg into the skin every 30 (thirty) days., Disp: 0.8 mL, Rfl: 12 .  FARXIGA 10 MG TABS tablet, Take 1 tablet (10 mg total) by mouth daily., Disp: 90 tablet, Rfl: 3 .  Fluocinolone Acetonide 0.01 % OIL, instill one drop in ears every 2-3 DAYS AS NEEDED FOR pruritis, Disp: 20 mL, Rfl: 1 .  gabapentin (NEURONTIN) 600 MG tablet, TAKE ONE TABLET BY MOUTH EVERY MORNING and TAKE ONE TABLET BY MOUTH EVERYDAY AT BEDTIME (Patient taking differently: Take 600 mg by mouth 2 (two) times daily.), Disp: 60 tablet, Rfl: 3 .  glucose blood (ACCU-CHEK GUIDE) test strip, Use to test blood sugar up to 3 times daily, Disp: 100 each, Rfl: 3 .  insulin glargine, 1 Unit Dial, (TOUJEO SOLOSTAR) 300 UNIT/ML Solostar Pen, Inject 22 Units into the skin daily. Eat a snack with protein nightly before bedtime., Disp: 15 mL, Rfl: 6 .  insulin lispro (HUMALOG KWIKPEN) 100 UNIT/ML KwikPen, Max daily 30 units (Patient taking differently: Inject 4-8  Units into the skin daily as needed. Sliding scale: Max daily 30 units), Disp: 30 mL, Rfl: 3 .  Insulin Pen Needle 32G X 4 MM MISC, 1 Device by Does not apply route in the morning, at noon, in the evening, and at bedtime., Disp: 400 each, Rfl: 3 .  isosorbide mononitrate (IMDUR) 30 MG 24 hr tablet, Take 1 tablet (30 mg total) by mouth daily., Disp: 30 tablet, Rfl: 0 .  ketoconazole (NIZORAL) 2 % shampoo, Apply 1 application. topically 2 (two) times a week. (Patient taking differently: Apply 1 application  topically daily as needed for irritation.), Disp: 120 mL, Rfl: 2 .  Lancets Misc. (ACCU-CHEK FASTCLIX LANCET) KIT, Use to test blood sugar up to 3 times daily, Disp: 1 kit, Rfl: 1 .  levothyroxine (SYNTHROID) 150 MCG tablet, TAKE ONE TABLET BY MOUTH BEFORE BREAKFAST (Patient taking differently: Take 150 mcg by mouth daily before breakfast.), Disp: 90 tablet, Rfl: 3 .  magnesium oxide (MAG-OX) 400 MG tablet, TAKE ONE TABLET BY MOUTH EVERY MORNING, Disp: 90 tablet, Rfl: 1 .  metoprolol succinate (TOPROL-XL) 25 MG 24 hr tablet, TAKE ONE TABLET BY MOUTH ONCE DAILY with OR immedaitely following A meal (Patient taking differently: Take 25 mg by mouth daily. TAKE ONE TABLET BY MOUTH ONCE DAILY with OR immedaitely following A meal), Disp: 90 tablet, Rfl: 3 .  Multiple Vitamin (MULTIVITAMIN) tablet, Take 1 tablet by mouth daily. K FREE DAILY once daily (MVI with no vitamin K), Disp: , Rfl:  .  nitroGLYCERIN (NITROSTAT) 0.4 MG SL tablet, Dissolve 1 tab under tongue as needed for chest pain. May repeat every 5 minutes x 2 doses. If no relief call 9-1-1., Disp: 75 tablet, Rfl: 2 .  pantoprazole (PROTONIX) 20 MG tablet, Take 1 tablet (20 mg total) by mouth daily., Disp: 30 tablet, Rfl: 2 .  Polyethylene Glycol 3350 (MIRALAX PO), Take 1 Package by mouth daily as needed (constipation)., Disp: , Rfl:  .  potassium chloride SA (KLOR-CON M) 20 MEQ tablet, Take 2 tablets (40 mEq total) by mouth daily., Disp: 60 tablet,  Rfl: 6 .  Propylene Glycol (SYSTANE BALANCE OP), Place 1 drop into both eyes daily as needed (for dry eyes)., Disp: , Rfl:  .  sacubitril-valsartan (ENTRESTO) 24-26 MG, Take 1 tablet by mouth 2 (two) times daily., Disp: 90 tablet, Rfl: 3 .  Tafamidis 61 MG CAPS, Take 1 capsule (61 mg total) by mouth daily., Disp: 30 capsule, Rfl: 12 .  tirzepatide (MOUNJARO) 2.5 MG/0.5ML Pen, Inject 2.5 mg into the skin once a week. (Patient taking differently: Inject 2.5 mg into the skin once a week. Monday), Disp: 6 mL, Rfl: 3 .  torsemide (DEMADEX) 20 MG tablet, Take 4 tablets (80 mg total) by mouth daily. May take an additional 20 mg as needed for swelling, Disp: 120 tablet, Rfl: 6 .  trolamine salicylate (ASPERCREME) 10 % cream, Apply 1 application. topically as needed for muscle pain., Disp: , Rfl:  .  warfarin (COUMADIN) 5 MG tablet, TAKE 1/2 TABLET BY MOUTH daily OR as directed by coumadin clinic (Patient taking differently: Take 2.5 mg by mouth at bedtime. TAKE 1/2 TABLET BY MOUTH daily OR as directed by coumadin clinic), Disp: 50 tablet, Rfl: 1 .  Multiple Vitamins-Minerals (ONE-A-DAY WOMENS PO), Take 1 tablet by mouth daily. (Patient not taking: Reported on 04/14/2023), Disp: , Rfl:  Allergies  Allergen Reactions  . Ace Inhibitors Other (See Comments)    Unknown reaction  . Amlodipine Other (See Comments)    Unknown reaction  . Atenolol Other (See Comments)    bradycardia  . Avandia [Rosiglitazone] Other (See Comments)    Unknown reaction  . Darvon [Propoxyphene] Other (See Comments)    Hallucinations   . Erythromycin Itching  . Hydralazine Other (See Comments)    Burning in throat and chest  . Hydrocodone Other (See Comments)    Hallucinations.  . Levofloxacin Itching  . Morphine And Codeine Other (See Comments)    Dizzy and hallucianation, vomiting; Willing to try low dose  . Percocet [Oxycodone-Acetaminophen] Other (See Comments)    hallucination  . Spironolactone Other (See Comments)     Unknown reaction  . Tramadol Other (See Comments)    Unknown/does not recall reaction but does not want to take again     Social History   Socioeconomic History  . Marital status: Widowed    Spouse name: Not on file  . Number of children: Not on file  . Years of education: Not on file  . Highest education level: Not on file  Occupational History  .  Occupation: Retired  Tobacco Use  . Smoking status: Never  . Smokeless tobacco: Never  Vaping Use  . Vaping Use: Never used  Substance and Sexual Activity  . Alcohol use: No  . Drug use: Never  . Sexual activity: Not Currently  Other Topics Concern  . Not on file  Social History Narrative   Lives with daughter   Social Determinants of Health   Financial Resource Strain: Low Risk  (02/20/2023)   Overall Financial Resource Strain (CARDIA)   . Difficulty of Paying Living Expenses: Not hard at all  Food Insecurity: No Food Insecurity (03/22/2023)   Hunger Vital Sign   . Worried About Programme researcher, broadcasting/film/video in the Last Year: Never true   . Ran Out of Food in the Last Year: Never true  Transportation Needs: No Transportation Needs (02/20/2023)   PRAPARE - Transportation   . Lack of Transportation (Medical): No   . Lack of Transportation (Non-Medical): No  Physical Activity: Inactive (02/20/2023)   Exercise Vital Sign   . Days of Exercise per Week: 0 days   . Minutes of Exercise per Session: 0 min  Stress: No Stress Concern Present (02/20/2023)   [employer] of Occupational Health - Occupational Stress Questionnaire   . Feeling of Stress : Not at all  Social Connections: Moderately Integrated (02/20/2023)   Social Connection and Isolation Panel [NHANES]   . Frequency of Communication with Friends and Family: More than three times a week   . Frequency of Social Gatherings with Friends and Family: More than three times a week   . Attends Religious Services: More than 4 times per year   . Active Member of Clubs or Organizations: Yes    . Attends Banker Meetings: More than 4 times per year   . Marital Status: Widowed  Intimate Partner Violence: Not At Risk (02/20/2023)   Humiliation, Afraid, Rape, and Kick questionnaire   . Fear of Current or Ex-Partner: No   . Emotionally Abused: No   . Physically Abused: No   . Sexually Abused: No    Physical Exam      Future Appointments  Date Time Provider Department Center  04/18/2023  1:45 PM [facility] None  04/27/2023 12:10 PM Shamleffer, Konrad Dolores, MD [facility] None  05/03/2023  3:15 PM [facility] None  05/15/2023  3:30 PM [facility] PA/NP [facility] None  08/03/2023  3:20 PM O'Neal, Ronnald Ramp, MD [facility] None  02/28/2024  9:30 AM [facility] WELLNESS VISIT [facility]

## 2023-04-18 ENCOUNTER — Ambulatory Visit (HOSPITAL_COMMUNITY)
Admission: RE | Admit: 2023-04-18 | Discharge: 2023-04-18 | Disposition: A | Discharge status: Home / Self Care | Payer: Medicare Other | Source: Ambulatory Visit | Attending: Cardiology | Admitting: Cardiology

## 2023-04-18 ENCOUNTER — Other Ambulatory Visit (HOSPITAL_COMMUNITY): Payer: Self-pay | Admitting: Emergency Medicine

## 2023-04-18 ENCOUNTER — Telehealth: Payer: Self-pay

## 2023-04-18 ENCOUNTER — Telehealth (HOSPITAL_COMMUNITY): Payer: Self-pay | Admitting: Cardiology

## 2023-04-18 DIAGNOSIS — I43 Cardiomyopathy in diseases classified elsewhere: Secondary | ICD-10-CM | POA: Diagnosis not present

## 2023-04-18 DIAGNOSIS — E854 Organ-limited amyloidosis: Secondary | ICD-10-CM | POA: Insufficient documentation

## 2023-04-18 LAB — BASIC METABOLIC PANEL
Anion gap: 8 (ref 5–15)
BUN: 57 mg/dL — ABNORMAL HIGH (ref 8–23)
CO2: 27 mmol/L (ref 22–32)
Calcium: 9 mg/dL (ref 8.9–10.3)
Chloride: 102 mmol/L (ref 98–111)
Creatinine, Ser: 2.33 mg/dL — ABNORMAL HIGH (ref 0.44–1.00)
GFR, Estimated: 21 mL/min — ABNORMAL LOW (ref >60)
Glucose, Bld: 154 mg/dL — ABNORMAL HIGH (ref 70–99)
Potassium: 4.2 mmol/L (ref 3.5–5.1)
Sodium: 137 mmol/L (ref 135–145)

## 2023-04-18 MED ORDER — FARXIGA 10 MG PO TABS: 10.0000 mg | ORAL_TABLET | Freq: Every day | ORAL | 3 refills | Status: DC

## 2023-04-18 NOTE — Telephone Encounter (Signed)
Transition Care Management Unsuccessful Follow-up Telephone Call  Date of discharge and from where:  [facility] 6[facility]  Attempts:  1st Attempt  Reason for unsuccessful TCM follow-up call:  Left voice message      Lenard Forth [facility] Guide, [facility] [phone] [address] [place], [place], [place] [address] Phone: [phone] Email: [email or web address]

## 2023-04-18 NOTE — Telephone Encounter (Signed)
Refills sent to pharmacy. 

## 2023-04-18 NOTE — Telephone Encounter (Signed)
-----   Message from Beatrix Shipper, Paramedic sent at 04/18/2023  1:29 PM EDT ----- Regarding: refill Needs refill for her Farxiga sent to [facility] [phone]  Thanks,    Beatrix Shipper, EMT-Paramedic [phone] 04/18/2023

## 2023-04-18 NOTE — Progress Notes (Signed)
Paramedicine Encounter    Patient ID: Regina [name], female    DOB: Aug 11, 1941, 82 y.o.   MRN: [id number]   Complaints NONE  Assessment A&O x 4, skin W&D w/ good color.  Lung sounds clear bilat and no edema noted.  She reports to feel good.  Compliance with meds 100%  Pill box filled x 1 week  Refills needed Farxiga  Meds changes since last visit NONE    Social changes NONE   BP 118/76 (BP Location: Left Arm, Patient Position: Sitting, Cuff Size: Normal)   Pulse 80   Resp 16   Wt 224 lb (101.6 kg)   BMI 40.97 kg/m  Weight yesterday- not taken  Last visit weight-224lb   Pt has Lab work scheduled for 1:45 today.  I will monitor for any potential med changes.  Med box reconciled.  Pt. Need refill on Farxiga.  Message sent to clinic for same.   ACTION: Home visit completed  Bethanie Dicker [phone] 04/18/23  Patient Care Team: [place], [name], MD as PCP - General (Family Medicine) O'Neal, Ronnald Ramp, MD as PCP - Cardiology (Internal Medicine) Sherrill Raring, [facility] (Inactive) (Pharmacist)  Patient Active Problem List   Diagnosis Date Noted   Acquired thrombophilia (HCC) 02/10/2023   Multiple lipomas 10/04/2022   Dizziness, nonspecific 09/22/2022   Dysphagia 09/22/2022   Left shoulder pain 09/22/2022   Pain due to onychomycosis of toenail of left foot 02/11/2022   Chest pain 12/26/2021   Left atrial thrombus 12/25/2021   Wild-type transthyretin-related (ATTR) amyloidosis (HCC)    Lung nodules 12/13/2021   Polymyalgia rheumatica (HCC) 06/30/2021   Neck pain 06/18/2021   Gait abnormality 06/18/2021   Memory loss 06/18/2021   Atherosclerosis of aorta (HCC) 06/09/2021   Type 2 diabetes mellitus with stage 3a chronic kidney disease, with long-term current use of insulin (HCC) 09/14/2020   Type 2 diabetes mellitus with retinopathy, with long-term current use of insulin (HCC) 09/14/2020   Diabetes mellitus (HCC) 09/14/2020   Type 2 diabetes  mellitus with diabetic polyneuropathy, with long-term current use of insulin (HCC) 09/14/2020   Long term (current) use of anticoagulants 07/13/2020   Persistent atrial fibrillation (HCC)    Mixed hyperlipidemia    Acute on chronic diastolic CHF (congestive heart failure), NYHA class 3 (HCC)    Demand ischemia    Atrial fibrillation (HCC) 04/24/2020   Other fatigue 03/24/2020   Short of breath on exertion 03/24/2020   Congestive heart failure (HCC) 03/24/2020   Vitamin D deficiency 03/24/2020   Sleep apnea 05/21/2019   History of hepatitis C 05/21/2019   Insomnia 05/21/2019   CKD (chronic kidney disease) stage 4, GFR 15-29 ml/min (HCC) 05/21/2019   GERD (gastroesophageal reflux disease) 04/20/2018   Type 2 diabetes mellitus with diabetic neuropathy, unspecified (HCC) 12/19/2016   History of TIA (transient ischemic attack) 12/19/2016   Hyperlipidemia associated with type 2 diabetes mellitus (HCC) 12/19/2016   S/P total knee replacement using cement, right 03/18/2015   Non-obstructive CAD    Cardiovascular stress test abnormal    Pleuritic chest pain 01/16/2015   Acute renal failure superimposed on stage 3 chronic kidney disease (HCC) 01/16/2015   Obesity, Class III, BMI 40-49.9 (morbid obesity) (HCC) 01/16/2015   Essential hypertension 01/16/2015   Hypothyroidism 01/16/2015   OSA on CPAP 01/16/2015   DM type 2 (diabetes mellitus, type 2) (HCC) 01/16/2015   Coronary artery disease involving native coronary artery without angina pectoris 10/02/2014   Physical deconditioning 02/28/2013  Morbid obesity (HCC) 02/27/2013   Osteoarthritis 02/27/2013    Current Outpatient Medications:    acetaminophen (TYLENOL) 500 MG tablet, Take 1,000 mg by mouth every 6 (six) hours as needed for mild pain., Disp: , Rfl:    Alcohol Swabs (B-D SINGLE USE SWABS REGULAR) PADS, Use to test blood sugar up to 3 times daily, Disp: 100 each, Rfl: 3   allopurinol (ZYLOPRIM) 100 MG tablet, TAKE ONE TABLET BY  MOUTH EVERY MORNING (Patient taking differently: Take 100 mg by mouth.), Disp: 90 tablet, Rfl: 1   atorvastatin (LIPITOR) 20 MG tablet, TAKE ONE TABLET BY MOUTH EVERYDAY AT BEDTIME (Patient taking differently: Take 20 mg by mouth at bedtime.), Disp: 90 tablet, Rfl: 3   Blood Glucose Monitoring Suppl (ACCU-CHEK GUIDE) w/Device KIT, 1 Device by Does not apply route 3 (three) times daily., Disp: 1 kit, Rfl: 0   cetirizine (ZYRTEC) 10 MG chewable tablet, Chew 10 mg by mouth daily., Disp: , Rfl:    Continuous Blood Gluc Receiver (FREESTYLE LIBRE 2 READER) DEVI, Use as instructed to check blood sugar., Disp: 1 each, Rfl: 0   Continuous Blood Gluc Sensor (FREESTYLE LIBRE 2 SENSOR) MISC, Change  sensors every 14 days, Disp: 6 each, Rfl: 3   Eplontersen Sodium (WAINUA) 45 MG/0.8ML SOAJ, Inject 45 mg into the skin every 30 (thirty) days., Disp: 0.8 mL, Rfl: 12   Fluocinolone Acetonide 0.01 % OIL, instill one drop in ears every 2-3 DAYS AS NEEDED FOR pruritis, Disp: 20 mL, Rfl: 1   gabapentin (NEURONTIN) 600 MG tablet, TAKE ONE TABLET BY MOUTH EVERY MORNING and TAKE ONE TABLET BY MOUTH EVERYDAY AT BEDTIME (Patient taking differently: Take 600 mg by mouth 2 (two) times daily.), Disp: 60 tablet, Rfl: 3   glucose blood (ACCU-CHEK GUIDE) test strip, Use to test blood sugar up to 3 times daily, Disp: 100 each, Rfl: 3   insulin glargine, 1 Unit Dial, (TOUJEO SOLOSTAR) 300 UNIT/ML Solostar Pen, Inject 22 Units into the skin daily. Eat a snack with protein nightly before bedtime., Disp: 15 mL, Rfl: 6   insulin lispro (HUMALOG KWIKPEN) 100 UNIT/ML KwikPen, Max daily 30 units (Patient taking differently: Inject 4-8 Units into the skin daily as needed. Sliding scale: Max daily 30 units), Disp: 30 mL, Rfl: 3   Insulin Pen Needle 32G X 4 MM MISC, 1 Device by Does not apply route in the morning, at noon, in the evening, and at bedtime., Disp: 400 each, Rfl: 3   isosorbide mononitrate (IMDUR) 30 MG 24 hr tablet, Take 1 tablet  (30 mg total) by mouth daily., Disp: 30 tablet, Rfl: 0   ketoconazole (NIZORAL) 2 % shampoo, Apply 1 application. topically 2 (two) times a week. (Patient taking differently: Apply 1 application  topically daily as needed for irritation.), Disp: 120 mL, Rfl: 2   Lancets Misc. (ACCU-CHEK FASTCLIX LANCET) KIT, Use to test blood sugar up to 3 times daily, Disp: 1 kit, Rfl: 1   levothyroxine (SYNTHROID) 150 MCG tablet, TAKE ONE TABLET BY MOUTH BEFORE BREAKFAST (Patient taking differently: Take 150 mcg by mouth daily before breakfast.), Disp: 90 tablet, Rfl: 3   magnesium oxide (MAG-OX) 400 MG tablet, TAKE ONE TABLET BY MOUTH EVERY MORNING, Disp: 90 tablet, Rfl: 1   metoprolol succinate (TOPROL-XL) 25 MG 24 hr tablet, TAKE ONE TABLET BY MOUTH ONCE DAILY with OR immedaitely following A meal (Patient taking differently: Take 25 mg by mouth daily. TAKE ONE TABLET BY MOUTH ONCE DAILY with OR immedaitely following A  meal), Disp: 90 tablet, Rfl: 3   Multiple Vitamin (MULTIVITAMIN) tablet, Take 1 tablet by mouth daily. K FREE DAILY once daily (MVI with no vitamin K), Disp: , Rfl:    nitroGLYCERIN (NITROSTAT) 0.4 MG SL tablet, Dissolve 1 tab under tongue as needed for chest pain. May repeat every 5 minutes x 2 doses. If no relief call 9-1-1., Disp: 75 tablet, Rfl: 2   pantoprazole (PROTONIX) 20 MG tablet, Take 1 tablet (20 mg total) by mouth daily., Disp: 30 tablet, Rfl: 2   Polyethylene Glycol 3350 (MIRALAX PO), Take 1 Package by mouth daily as needed (constipation)., Disp: , Rfl:    potassium chloride SA (KLOR-CON M) 20 MEQ tablet, Take 2 tablets (40 mEq total) by mouth daily., Disp: 60 tablet, Rfl: 6   Propylene Glycol (SYSTANE BALANCE OP), Place 1 drop into both eyes daily as needed (for dry eyes)., Disp: , Rfl:    sacubitril-valsartan (ENTRESTO) 24-26 MG, Take 1 tablet by mouth 2 (two) times daily., Disp: 90 tablet, Rfl: 3   Tafamidis 61 MG CAPS, Take 1 capsule (61 mg total) by mouth daily., Disp: 30 capsule,  Rfl: 12   tirzepatide (MOUNJARO) 2.5 MG/0.5ML Pen, Inject 2.5 mg into the skin once a week. (Patient taking differently: Inject 2.5 mg into the skin once a week. Monday), Disp: 6 mL, Rfl: 3   torsemide (DEMADEX) 20 MG tablet, Take 4 tablets (80 mg total) by mouth daily. May take an additional 20 mg as needed for swelling, Disp: 120 tablet, Rfl: 6   trolamine salicylate (ASPERCREME) 10 % cream, Apply 1 application. topically as needed for muscle pain., Disp: , Rfl:    warfarin (COUMADIN) 5 MG tablet, TAKE 1/2 TABLET BY MOUTH daily OR as directed by coumadin clinic (Patient taking differently: Take 2.5 mg by mouth at bedtime. TAKE 1/2 TABLET BY MOUTH daily OR as directed by coumadin clinic), Disp: 50 tablet, Rfl: 1   FARXIGA 10 MG TABS tablet, Take 1 tablet (10 mg total) by mouth daily., Disp: 90 tablet, Rfl: 3   Multiple Vitamins-Minerals (ONE-A-DAY WOMENS PO), Take 1 tablet by mouth daily. (Patient not taking: Reported on 04/14/2023), Disp: , Rfl:  Allergies  Allergen Reactions   Ace Inhibitors Other (See Comments)    Unknown reaction   Amlodipine Other (See Comments)    Unknown reaction   Atenolol Other (See Comments)    bradycardia   Avandia [Rosiglitazone] Other (See Comments)    Unknown reaction   Darvon [Propoxyphene] Other (See Comments)    Hallucinations    Erythromycin Itching   Hydralazine Other (See Comments)    Burning in throat and chest   Hydrocodone Other (See Comments)    Hallucinations.   Levofloxacin Itching   Morphine And Codeine Other (See Comments)    Dizzy and hallucianation, vomiting; Willing to try low dose   Percocet [Oxycodone-Acetaminophen] Other (See Comments)    hallucination   Spironolactone Other (See Comments)    Unknown reaction   Tramadol Other (See Comments)    Unknown/does not recall reaction but does not want to take again     Social History   Socioeconomic History   Marital status: Widowed    Spouse name: Not on file   Number of children:  Not on file   Years of education: Not on file   Highest education level: Not on file  Occupational History   Occupation: Retired  Tobacco Use   Smoking status: Never   Smokeless tobacco: Never  Vaping Use  Vaping Use: Never used  Substance and Sexual Activity   Alcohol use: No   Drug use: Never   Sexual activity: Not Currently  Other Topics Concern   Not on file  Social History Narrative   Lives with daughter   Social Determinants of Health   Financial Resource Strain: Low Risk  (02/20/2023)   Overall Financial Resource Strain (CARDIA)    Difficulty of Paying Living Expenses: Not hard at all  Food Insecurity: No Food Insecurity (03/22/2023)   Hunger Vital Sign    Worried About Running Out of Food in the Last Year: Never true    Ran Out of Food in the Last Year: Never true  Transportation Needs: No Transportation Needs (02/20/2023)   PRAPARE - Administrator, Civil Service (Medical): No    Lack of Transportation (Non-Medical): No  Physical Activity: Inactive (02/20/2023)   Exercise Vital Sign    Days of Exercise per Week: 0 days    Minutes of Exercise per Session: 0 min  Stress: No Stress Concern Present (02/20/2023)   [employer] of Occupational Health - Occupational Stress Questionnaire    Feeling of Stress : Not at all  Social Connections: Moderately Integrated (02/20/2023)   Social Connection and Isolation Panel [NHANES]    Frequency of Communication with Friends and Family: More than three times a week    Frequency of Social Gatherings with Friends and Family: More than three times a week    Attends Religious Services: More than 4 times per year    Active Member of [employer] or Organizations: Yes    Attends Banker Meetings: More than 4 times per year    Marital Status: Widowed  Intimate Partner Violence: Not At Risk (02/20/2023)   Humiliation, Afraid, Rape, and Kick questionnaire    Fear of Current or Ex-Partner: No    Emotionally Abused: No     Physically Abused: No    Sexually Abused: No    Physical Exam      Future Appointments  Date Time Provider Department Center  04/27/2023 12:10 PM Shamleffer, Konrad Dolores, MD [facility] None  05/03/2023  3:15 PM [facility] None  05/15/2023  3:30 PM [facility] PA/NP [facility] None  08/03/2023  3:20 PM O'Neal, Ronnald Ramp, MD [facility] None  02/28/2024  9:30 AM [facility] VISIT [facility]

## 2023-04-24 ENCOUNTER — Telehealth: Payer: Self-pay

## 2023-04-24 NOTE — Telephone Encounter (Signed)
Transition Care Management Unsuccessful Follow-up Telephone Call  Date of discharge and from where:  [facility] 6/24  Attempts:  2nd Attempt  Reason for unsuccessful TCM follow-up call:  Left voice message   Regina [name] [facility], [facility] [phone] [address], [place], [place] [address] Phone: [phone] Email: [email or web address]       

## 2023-04-25 ENCOUNTER — Other Ambulatory Visit (HOSPITAL_COMMUNITY): Payer: Self-pay | Admitting: Emergency Medicine

## 2023-04-25 NOTE — Progress Notes (Signed)
Paramedicine Encounter    Patient ID: Regina [name], female    DOB: 05/13/1941, 82 y.o.   MRN: [id number]   Complaints NONE  Assessment A&O x 4, skin W&D w/ good color.  Lung sounds clear and equal bilat.   Compliance with meds 100%  Pill box filled fill by pt. accurately  Refills needed NONE  Meds changes since last visit NONE    Social changes NONE   BP (!) 140/70 (BP Location: Left Arm, Patient Position: Sitting, Cuff Size: Normal)   Pulse 86   Resp 16   Wt 234 lb (106.1 kg)   SpO2 96%   BMI 42.80 kg/m  Weight yesterday-not taken Last visit weight-224lb    ACTION: Home visit completed  Bethanie Dicker [phone] 04/25/23  Patient Care Team: [place], [name], MD as PCP - General (Family Medicine) O'Neal, Ronnald Ramp, MD as PCP - Cardiology (Internal Medicine) Leitha Bleak, Milas Kocher, [facility] (Inactive) (Pharmacist)  Patient Active Problem List   Diagnosis Date Noted  . Acquired thrombophilia (HCC) 02/10/2023  . Multiple lipomas 10/04/2022  . Dizziness, nonspecific 09/22/2022  . Dysphagia 09/22/2022  . Left shoulder pain 09/22/2022  . Pain due to onychomycosis of toenail of left foot 02/11/2022  . Chest pain 12/26/2021  . Left atrial thrombus 12/25/2021  . Wild-type transthyretin-related (ATTR) amyloidosis (HCC)   . Lung nodules 12/13/2021  . Polymyalgia rheumatica (HCC) 06/30/2021  . Neck pain 06/18/2021  . Gait abnormality 06/18/2021  . Memory loss 06/18/2021  . Atherosclerosis of aorta (HCC) 06/09/2021  . Type 2 diabetes mellitus with stage 3a chronic kidney disease, with long-term current use of insulin (HCC) 09/14/2020  . Type 2 diabetes mellitus with retinopathy, with long-term current use of insulin (HCC) 09/14/2020  . Diabetes mellitus (HCC) 09/14/2020  . Type 2 diabetes mellitus with diabetic polyneuropathy, with long-term current use of insulin (HCC) 09/14/2020  . Long term (current) use of anticoagulants 07/13/2020  . Persistent  atrial fibrillation (HCC)   . Mixed hyperlipidemia   . Acute on chronic diastolic CHF (congestive heart failure), NYHA class 3 (HCC)   . Demand ischemia   . Atrial fibrillation (HCC) 04/24/2020  . Other fatigue 03/24/2020  . Short of breath on exertion 03/24/2020  . Congestive heart failure (HCC) 03/24/2020  . Vitamin D deficiency 03/24/2020  . Sleep apnea 05/21/2019  . History of hepatitis C 05/21/2019  . Insomnia 05/21/2019  . CKD (chronic kidney disease) stage 4, GFR 15-29 ml/min (HCC) 05/21/2019  . GERD (gastroesophageal reflux disease) 04/20/2018  . Type 2 diabetes mellitus with diabetic neuropathy, unspecified (HCC) 12/19/2016  . History of TIA (transient ischemic attack) 12/19/2016  . Hyperlipidemia associated with type 2 diabetes mellitus (HCC) 12/19/2016  . S/P total knee replacement using cement, right 03/18/2015  . Non-obstructive CAD   . Cardiovascular stress test abnormal   . Pleuritic chest pain 01/16/2015  . Acute renal failure superimposed on stage 3 chronic kidney disease (HCC) 01/16/2015  . Obesity, Class III, BMI 40-49.9 (morbid obesity) (HCC) 01/16/2015  . Essential hypertension 01/16/2015  . Hypothyroidism 01/16/2015  . OSA on CPAP 01/16/2015  . DM type 2 (diabetes mellitus, type 2) (HCC) 01/16/2015  . Coronary artery disease involving native coronary artery without angina pectoris 10/02/2014  . Physical deconditioning 02/28/2013  . Morbid obesity (HCC) 02/27/2013  . Osteoarthritis 02/27/2013    Current Outpatient Medications:  .  acetaminophen (TYLENOL) 500 MG tablet, Take 1,000 mg by mouth every 6 (six) hours as needed for mild pain., Disp: ,  Rfl:  .  Alcohol Swabs (B-D SINGLE USE SWABS REGULAR) PADS, Use to test blood sugar up to 3 times daily, Disp: 100 each, Rfl: 3 .  allopurinol (ZYLOPRIM) 100 MG tablet, TAKE ONE TABLET BY MOUTH EVERY MORNING (Patient taking differently: Take 100 mg by mouth.), Disp: 90 tablet, Rfl: 1 .  atorvastatin (LIPITOR) 20 MG  tablet, TAKE ONE TABLET BY MOUTH EVERYDAY AT BEDTIME (Patient taking differently: Take 20 mg by mouth at bedtime.), Disp: 90 tablet, Rfl: 3 .  Blood Glucose Monitoring Suppl (ACCU-CHEK GUIDE) w/Device KIT, 1 Device by Does not apply route 3 (three) times daily., Disp: 1 kit, Rfl: 0 .  cetirizine (ZYRTEC) 10 MG chewable tablet, Chew 10 mg by mouth daily., Disp: , Rfl:  .  Continuous Blood Gluc Receiver (FREESTYLE LIBRE 2 READER) DEVI, Use as instructed to check blood sugar., Disp: 1 each, Rfl: 0 .  Continuous Blood Gluc Sensor (FREESTYLE LIBRE 2 SENSOR) MISC, Change  sensors every 14 days, Disp: 6 each, Rfl: 3 .  Eplontersen Sodium (WAINUA) 45 MG/0.8ML SOAJ, Inject 45 mg into the skin every 30 (thirty) days., Disp: 0.8 mL, Rfl: 12 .  FARXIGA 10 MG TABS tablet, Take 1 tablet (10 mg total) by mouth daily., Disp: 90 tablet, Rfl: 3 .  Fluocinolone Acetonide 0.01 % OIL, instill one drop in ears every 2-3 DAYS AS NEEDED FOR pruritis, Disp: 20 mL, Rfl: 1 .  gabapentin (NEURONTIN) 600 MG tablet, TAKE ONE TABLET BY MOUTH EVERY MORNING and TAKE ONE TABLET BY MOUTH EVERYDAY AT BEDTIME (Patient taking differently: Take 600 mg by mouth 2 (two) times daily.), Disp: 60 tablet, Rfl: 3 .  glucose blood (ACCU-CHEK GUIDE) test strip, Use to test blood sugar up to 3 times daily, Disp: 100 each, Rfl: 3 .  insulin glargine, 1 Unit Dial, (TOUJEO SOLOSTAR) 300 UNIT/ML Solostar Pen, Inject 22 Units into the skin daily. Eat a snack with protein nightly before bedtime., Disp: 15 mL, Rfl: 6 .  insulin lispro (HUMALOG KWIKPEN) 100 UNIT/ML KwikPen, Max daily 30 units (Patient taking differently: Inject 4-8 Units into the skin daily as needed. Sliding scale: Max daily 30 units), Disp: 30 mL, Rfl: 3 .  Insulin Pen Needle 32G X 4 MM MISC, 1 Device by Does not apply route in the morning, at noon, in the evening, and at bedtime., Disp: 400 each, Rfl: 3 .  isosorbide mononitrate (IMDUR) 30 MG 24 hr tablet, Take 1 tablet (30 mg total) by  mouth daily., Disp: 30 tablet, Rfl: 0 .  ketoconazole (NIZORAL) 2 % shampoo, Apply 1 application. topically 2 (two) times a week. (Patient taking differently: Apply 1 application  topically daily as needed for irritation.), Disp: 120 mL, Rfl: 2 .  Lancets Misc. (ACCU-CHEK FASTCLIX LANCET) KIT, Use to test blood sugar up to 3 times daily, Disp: 1 kit, Rfl: 1 .  levothyroxine (SYNTHROID) 150 MCG tablet, TAKE ONE TABLET BY MOUTH BEFORE BREAKFAST (Patient taking differently: Take 150 mcg by mouth daily before breakfast.), Disp: 90 tablet, Rfl: 3 .  magnesium oxide (MAG-OX) 400 MG tablet, TAKE ONE TABLET BY MOUTH EVERY MORNING, Disp: 90 tablet, Rfl: 1 .  metoprolol succinate (TOPROL-XL) 25 MG 24 hr tablet, TAKE ONE TABLET BY MOUTH ONCE DAILY with OR immedaitely following A meal (Patient taking differently: Take 25 mg by mouth daily. TAKE ONE TABLET BY MOUTH ONCE DAILY with OR immedaitely following A meal), Disp: 90 tablet, Rfl: 3 .  Multiple Vitamin (MULTIVITAMIN) tablet, Take 1 tablet by mouth  daily. K FREE DAILY once daily (MVI with no vitamin K), Disp: , Rfl:  .  nitroGLYCERIN (NITROSTAT) 0.4 MG SL tablet, Dissolve 1 tab under tongue as needed for chest pain. May repeat every 5 minutes x 2 doses. If no relief call 9-1-1., Disp: 75 tablet, Rfl: 2 .  pantoprazole (PROTONIX) 20 MG tablet, Take 1 tablet (20 mg total) by mouth daily., Disp: 30 tablet, Rfl: 2 .  Polyethylene Glycol 3350 (MIRALAX PO), Take 1 Package by mouth daily as needed (constipation)., Disp: , Rfl:  .  potassium chloride SA (KLOR-CON M) 20 MEQ tablet, Take 2 tablets (40 mEq total) by mouth daily., Disp: 60 tablet, Rfl: 6 .  Propylene Glycol (SYSTANE BALANCE OP), Place 1 drop into both eyes daily as needed (for dry eyes)., Disp: , Rfl:  .  sacubitril-valsartan (ENTRESTO) 24-26 MG, Take 1 tablet by mouth 2 (two) times daily., Disp: 90 tablet, Rfl: 3 .  Tafamidis 61 MG CAPS, Take 1 capsule (61 mg total) by mouth daily., Disp: 30 capsule, Rfl:  12 .  tirzepatide (MOUNJARO) 2.5 MG/0.5ML Pen, Inject 2.5 mg into the skin once a week. (Patient taking differently: Inject 2.5 mg into the skin once a week. Monday), Disp: 6 mL, Rfl: 3 .  torsemide (DEMADEX) 20 MG tablet, Take 4 tablets (80 mg total) by mouth daily. May take an additional 20 mg as needed for swelling, Disp: 120 tablet, Rfl: 6 .  trolamine salicylate (ASPERCREME) 10 % cream, Apply 1 application. topically as needed for muscle pain., Disp: , Rfl:  .  warfarin (COUMADIN) 5 MG tablet, TAKE 1/2 TABLET BY MOUTH daily OR as directed by coumadin clinic (Patient taking differently: Take 2.5 mg by mouth at bedtime. TAKE 1/2 TABLET BY MOUTH daily OR as directed by coumadin clinic), Disp: 50 tablet, Rfl: 1 .  Multiple Vitamins-Minerals (ONE-A-DAY WOMENS PO), Take 1 tablet by mouth daily. (Patient not taking: Reported on 04/14/2023), Disp: , Rfl:  Allergies  Allergen Reactions  . Ace Inhibitors Other (See Comments)    Unknown reaction  . Amlodipine Other (See Comments)    Unknown reaction  . Atenolol Other (See Comments)    bradycardia  . Avandia [Rosiglitazone] Other (See Comments)    Unknown reaction  . Darvon [Propoxyphene] Other (See Comments)    Hallucinations   . Erythromycin Itching  . Hydralazine Other (See Comments)    Burning in throat and chest  . Hydrocodone Other (See Comments)    Hallucinations.  . Levofloxacin Itching  . Morphine And Codeine Other (See Comments)    Dizzy and hallucianation, vomiting; Willing to try low dose  . Percocet [Oxycodone-Acetaminophen] Other (See Comments)    hallucination  . Spironolactone Other (See Comments)    Unknown reaction  . Tramadol Other (See Comments)    Unknown/does not recall reaction but does not want to take again     Social History   Socioeconomic History  . Marital status: Widowed    Spouse name: Not on file  . Number of children: Not on file  . Years of education: Not on file  . Highest education level: Not on  file  Occupational History  . Occupation: Retired  Tobacco Use  . Smoking status: Never  . Smokeless tobacco: Never  Vaping Use  . Vaping Use: Never used  Substance and Sexual Activity  . Alcohol use: No  . Drug use: Never  . Sexual activity: Not Currently  Other Topics Concern  . Not on file  Social History  Narrative   Lives with daughter   Social Determinants of Health   Financial Resource Strain: Low Risk  (02/20/2023)   Overall Financial Resource Strain (CARDIA)   . Difficulty of Paying Living Expenses: Not hard at all  Food Insecurity: No Food Insecurity (03/22/2023)   Hunger Vital Sign   . Worried About Programme researcher, broadcasting/film/video in the Last Year: Never true   . Ran Out of Food in the Last Year: Never true  Transportation Needs: No Transportation Needs (02/20/2023)   PRAPARE - Transportation   . Lack of Transportation (Medical): No   . Lack of Transportation (Non-Medical): No  Physical Activity: Inactive (02/20/2023)   Exercise Vital Sign   . Days of Exercise per Week: 0 days   . Minutes of Exercise per Session: 0 min  Stress: No Stress Concern Present (02/20/2023)   [employer] of Occupational Health - Occupational Stress Questionnaire   . Feeling of Stress : Not at all  Social Connections: Moderately Integrated (02/20/2023)   Social Connection and Isolation Panel [NHANES]   . Frequency of Communication with Friends and Family: More than three times a week   . Frequency of Social Gatherings with Friends and Family: More than three times a week   . Attends Religious Services: More than 4 times per year   . Active Member of Clubs or Organizations: Yes   . Attends Banker Meetings: More than 4 times per year   . Marital Status: Widowed  Intimate Partner Violence: Not At Risk (02/20/2023)   Humiliation, Afraid, Rape, and Kick questionnaire   . Fear of Current or Ex-Partner: No   . Emotionally Abused: No   . Physically Abused: No   . Sexually Abused: No     Physical Exam      Future Appointments  Date Time Provider Department Center  04/27/2023 12:10 PM Shamleffer, Konrad Dolores, MD [facility] None  05/03/2023  3:15 PM [facility] None  05/15/2023  3:30 PM [facility] PA/NP [facility] None  08/03/2023  3:20 PM O'Neal, Ronnald Ramp, MD [facility] None  02/28/2024  9:30 AM [facility] VISIT [facility]

## 2023-04-26 ENCOUNTER — Other Ambulatory Visit (HOSPITAL_COMMUNITY): Payer: Self-pay

## 2023-04-27 ENCOUNTER — Ambulatory Visit: Payer: Medicare Other | Admitting: Internal Medicine

## 2023-04-27 ENCOUNTER — Encounter: Payer: Self-pay | Admitting: Internal Medicine

## 2023-04-27 VITALS — BP 100/60 | HR 95 | Ht 62.0 in | Wt 228.6 lb

## 2023-04-27 DIAGNOSIS — N1831 Chronic kidney disease, stage 3a: Secondary | ICD-10-CM

## 2023-04-27 DIAGNOSIS — E1159 Type 2 diabetes mellitus with other circulatory complications: Secondary | ICD-10-CM

## 2023-04-27 DIAGNOSIS — Z794 Long term (current) use of insulin: Secondary | ICD-10-CM

## 2023-04-27 DIAGNOSIS — E1122 Type 2 diabetes mellitus with diabetic chronic kidney disease: Secondary | ICD-10-CM

## 2023-04-27 DIAGNOSIS — E11319 Type 2 diabetes mellitus with unspecified diabetic retinopathy without macular edema: Secondary | ICD-10-CM | POA: Diagnosis not present

## 2023-04-27 DIAGNOSIS — E1140 Type 2 diabetes mellitus with diabetic neuropathy, unspecified: Secondary | ICD-10-CM | POA: Diagnosis not present

## 2023-04-27 LAB — POCT GLYCOSYLATED HEMOGLOBIN (HGB A1C): Hemoglobin A1C: 7 % — AB (ref 4.0–5.6)

## 2023-04-27 MED ORDER — TIRZEPATIDE 5 MG/0.5ML ~~LOC~~ SOAJ: 5.0000 mg | SUBCUTANEOUS | 3 refills | Status: DC

## 2023-04-27 NOTE — Patient Instructions (Addendum)
-   Increase Mounjaro 5 mg once weekly  - Toujeo 10 units daily  - Humalog correctional insulin: Use the scale below to help guide you BEFORE each meal   Blood sugar before meal Number of units to inject  Less than 165 0 unit  166 - 200 1 units  201 - 235 2 units  236 - 235 3 units  236 -270 4 units  271 - 305 5 units  306 - 340 6 units  341 - 375 7  units  376 - 410 8  units     HOW TO TREAT LOW BLOOD SUGARS (Blood sugar LESS THAN 70 MG/DL) Please follow the RULE OF 15 for the treatment of hypoglycemia treatment (when your (blood sugars are less than 70 mg/dL)   STEP 1: Take 15 grams of carbohydrates when your blood sugar is low, which includes:  3-4 GLUCOSE TABS  OR 3-4 OZ OF JUICE OR REGULAR SODA OR ONE TUBE OF GLUCOSE GEL    STEP 2: RECHECK blood sugar in 15 MINUTES STEP 3: If your blood sugar is still low at the 15 minute recheck --> then, go back to STEP 1 and treat AGAIN with another 15 grams of carbohydrates.

## 2023-04-27 NOTE — Progress Notes (Signed)
Name: Regina [name]  Age/ Sex: 82 y.o., female   MRN/ DOB: [id number], 02-Mar-1941     PCP: [place], [name], MD   Reason for Endocrinology Evaluation: Type 2 Diabetes Mellitus  Initial Endocrine Consultative Visit: 09/09/2020    PATIENT IDENTIFIER: Regina [name] is a 82 y.o. female with a past medical history of T2DM, HTN, CAD, OSA on CPAP  and A.Fib. The patient has followed with Endocrinology clinic since 09/09/2020 for consultative assistance with management of her diabetes.  DIABETIC HISTORY:  Regina [name] was diagnosed with DM yrs ago. Her hemoglobin A1c has ranged from 6.9% in 2021, peaking at 7.8% in 2020   On her initial visit to our clinic she had an A1c of 7.4% She was on basal insulin and Ozemopic, she was already out of Ozempic and we held off until more data was available about her retinopathy. We started humalog per correction scale   She was started on Farxiga by cardiology 04/2021   Nephrology [facility] - Dr. Valentino Nose   Started Greggory Keen 01/2023 SUBJECTIVE:   During the last visit (01/31/2023): A1c 7.7 %    Today (04/27/2023): Regina [name] is here for a follow up on diabetes management.She is accompanied by her . She checks her blood sugars multiple times a day through CGM . The patient has had hypoglycemic episodes since the last clinic visit which typically occur a night     She is off prednisone for Polymyagia rheumatica  She had a follow-up with cardiology for cardiac amyloid 12/2022 She has been to the ED a couple times for CHF and chest pain  No nausea , vomiting  Denies constipation or diarrhea having to strain She is on Senokot and ducloax   She has been skipping on toujeo at times due to hypoglycemia in the morning     HOME DIABETES REGIMEN:  Mounjaro 2.5 mg weekly (Monday ) Toujeo 20 units daily -takes as needed Humalog 3 TIDQAM  CF: Humalog: ( BG-130/35) Farxiga 10 mg daily-through cardiology    Statin: yes ACE-I/ARB:  no    CONTINUOUS GLUCOSE MONITORING RECORD INTERPRETATION    Dates of Recording: 6/28-7/08/2023  Sensor description:dexcom  Results statistics:   CGM use % of time 79  Average and SD 135/20.7  Time in range 92 %  % Time Above 180 8  % Time above 250 0  % Time Below target 0     Glycemic patterns summary: Bg's optimal overnight and increase during the day   Hyperglycemic episodes  postprandial   Hypoglycemic episodes occurred rare during the day  Overnight periods: Optimal     DIABETIC COMPLICATIONS: Microvascular complications:  CKD III, Retinopathy ( hx of laser )  Denies: neuropathy  Last eye exam: Completed 2021   Macrovascular complications:  Non-obstructive CAD  Denies: PVD, CVA     HISTORY:  Past Medical History:  Past Medical History:  Diagnosis Date   Back pain    CHF (congestive heart failure) (HCC)    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 01/2015 Cardiolite: + inf wall ischemia, EF 56%;  b. 01/2015 Cath: LM nl, LAD 70m, LCX min irregs,  RCA dominant, 50-93m.   Sleep apnea    Swallowing difficulty    Past Surgical History:  Past Surgical History:  Procedure Laterality Date   ABDOMINAL HYSTERECTOMY     BUBBLE STUDY  07/01/2020   Procedure: BUBBLE STUDY;  Surgeon: Parke Poisson, MD;  Location: [facility] ENDOSCOPY;  Service: Cardiovascular;;   BUBBLE STUDY  08/05/2020   Procedure: BUBBLE STUDY;  Surgeon: Sande Rives, MD;  Location: [facility] ENDOSCOPY;  Service: Cardiovascular;;  done with definity    CARDIAC CATHETERIZATION     ESOPHAGOGASTRODUODENOSCOPY     a. 01/2015 EGD: patent esophagus.   ESOPHAGOGASTRODUODENOSCOPY N/A 01/20/2015   Procedure:  ESOPHAGOGASTRODUODENOSCOPY (EGD);  Surgeon: Jeani Hawking, MD;  Location: [facility] ENDOSCOPY;  Service: Endoscopy;  Laterality: N/A;   HAND SURGERY     JOINT REPLACEMENT     reports history bilateral TKA and right TSA   LEFT HEART CATHETERIZATION WITH CORONARY ANGIOGRAM N/A 01/19/2015   RIGHT HEART CATH N/A 12/27/2021   Procedure: RIGHT HEART CATH;  Surgeon: Runell Gess, MD;  Location: [facility] INVASIVE CV LAB;  Service: Cardiovascular;  Laterality: N/A;   TEE WITHOUT CARDIOVERSION  05/04/2020   TEE WITHOUT CARDIOVERSION N/A 05/05/2020   Procedure: TRANSESOPHAGEAL ECHOCARDIOGRAM (TEE);  Surgeon: Chrystie Nose, MD;  Location: [facility] ENDOSCOPY;  Service: Cardiovascular;  Laterality: N/A;   TEE WITHOUT CARDIOVERSION N/A 07/01/2020   Procedure: TRANSESOPHAGEAL ECHOCARDIOGRAM (TEE);  Surgeon: Parke Poisson, MD;  Location: [facility] ENDOSCOPY;  Service: Cardiovascular;  Laterality: N/A;   TEE WITHOUT CARDIOVERSION N/A 08/05/2020   Procedure: TRANSESOPHAGEAL ECHOCARDIOGRAM (TEE);  Surgeon: Sande Rives, MD;  Location: [facility] ENDOSCOPY;  Service: Cardiovascular;  Laterality: N/A;   THYROIDECTOMY     Social History:  reports that she has never smoked. She has never used smokeless tobacco. She reports that she does not drink alcohol and does not use drugs. Family History:  Family History  Problem Relation Age of Onset   Heart disease Mother    Hypertension Mother    Cancer Father    Alcoholism Father      HOME MEDICATIONS: Allergies as of 04/27/2023       Reactions   Ace Inhibitors Other (See Comments)   Unknown reaction   Amlodipine Other (See Comments)   Unknown reaction   Atenolol Other (See Comments)   bradycardia   Avandia [rosiglitazone] Other (See Comments)   Unknown reaction   Darvon [propoxyphene] Other (See Comments)   Hallucinations    Erythromycin Itching   Hydralazine Other (See Comments)   Burning in throat and chest   Hydrocodone Other (See Comments)   Hallucinations.    Levofloxacin Itching   Morphine And Codeine Other (See Comments)   Dizzy and hallucianation, vomiting; Willing to try low dose   Percocet [oxycodone-acetaminophen] Other (See Comments)   hallucination   Spironolactone Other (See Comments)   Unknown reaction   Tramadol Other (See Comments)   Unknown/does not recall reaction but does not want to take again        Medication List        Accurate as of April 27, 2023 12:20 PM. If you have any questions, ask your nurse or doctor.          Accu-Chek [employer] Kit Use to test blood sugar up to 3 times daily   Accu-Chek Guide test strip Generic drug: glucose blood Use to test blood sugar up to 3 times daily   Accu-Chek Guide w/Device Kit 1 Device by Does not apply route  3 (three) times daily.   acetaminophen 500 MG tablet Commonly known as: TYLENOL Take 1,000 mg by mouth every 6 (six) hours as needed for mild pain.   allopurinol 100 MG tablet Commonly known as: ZYLOPRIM TAKE ONE TABLET BY MOUTH EVERY MORNING What changed: when to take this   atorvastatin 20 MG tablet Commonly known as: LIPITOR TAKE ONE TABLET BY MOUTH EVERYDAY AT BEDTIME What changed:  how much to take how to take this when to take this additional instructions   B-D SINGLE USE SWABS REGULAR Pads Use to test blood sugar up to 3 times daily   cetirizine 10 MG chewable tablet Commonly known as: ZYRTEC Chew 10 mg by mouth daily.   Entresto 24-26 MG Generic drug: sacubitril-valsartan Take 1 tablet by mouth 2 (two) times daily.   Farxiga 10 MG Tabs tablet Generic drug: dapagliflozin propanediol Take 1 tablet (10 mg total) by mouth daily.   Fluocinolone Acetonide 0.01 % Oil instill one drop in ears every 2-3 DAYS AS NEEDED FOR pruritis   FreeStyle Libre 2 Reader [facility] Use as instructed to check blood sugar.   FreeStyle Libre 2 Sensor Misc Change  sensors every 14 days   gabapentin 600 MG tablet Commonly known as: NEURONTIN TAKE ONE  TABLET BY MOUTH EVERY MORNING and TAKE ONE TABLET BY MOUTH EVERYDAY AT BEDTIME What changed: See the new instructions.   insulin lispro 100 UNIT/ML KwikPen Commonly known as: HumaLOG KwikPen Max daily 30 units What changed:  how much to take how to take this when to take this reasons to take this additional instructions   Insulin Pen Needle 32G X 4 MM Misc 1 Device by Does not apply route in the morning, at noon, in the evening, and at bedtime.   isosorbide mononitrate 30 MG 24 hr tablet Commonly known as: IMDUR Take 1 tablet (30 mg total) by mouth daily.   ketoconazole 2 % shampoo Commonly known as: Nizoral Apply 1 application. topically 2 (two) times a week. What changed:  when to take this reasons to take this   levothyroxine 150 MCG tablet Commonly known as: SYNTHROID TAKE ONE TABLET BY MOUTH BEFORE BREAKFAST What changed: See the new instructions.   magnesium oxide 400 MG tablet Commonly known as: MAG-OX TAKE ONE TABLET BY MOUTH EVERY MORNING   metoprolol succinate 25 MG 24 hr tablet Commonly known as: TOPROL-XL TAKE ONE TABLET BY MOUTH ONCE DAILY with OR immedaitely following A meal What changed:  how much to take how to take this when to take this   MIRALAX PO Take 1 Package by mouth daily as needed (constipation).   multivitamin tablet Take 1 tablet by mouth daily. K FREE DAILY once daily (MVI with no vitamin K)   nitroGLYCERIN 0.4 MG SL tablet Commonly known as: NITROSTAT Dissolve 1 tab under tongue as needed for chest pain. May repeat every 5 minutes x 2 doses. If no relief call 9-1-1.   ONE-A-DAY WOMENS PO Take 1 tablet by mouth daily.   pantoprazole 20 MG tablet Commonly known as: PROTONIX Take 1 tablet (20 mg total) by mouth daily.   potassium chloride SA 20 MEQ tablet Commonly known as: KLOR-CON M Take 2 tablets (40 mEq total) by mouth daily.   SYSTANE BALANCE OP Place 1 drop into both eyes daily as needed (for dry eyes).   tirzepatide  2.5 MG/0.5ML Pen Commonly known as: MOUNJARO Inject 2.5 mg into the skin once a week. What changed: additional instructions   torsemide 20  MG tablet Commonly known as: DEMADEX Take 4 tablets (80 mg total) by mouth daily. May take an additional 20 mg as needed for swelling   Toujeo SoloStar 300 UNIT/ML Solostar Pen Generic drug: insulin glargine (1 Unit Dial) Inject 22 Units into the skin daily. Eat a snack with protein nightly before bedtime.   trolamine salicylate 10 % cream Commonly known as: ASPERCREME Apply 1 application. topically as needed for muscle pain.   Vyndamax 61 MG Caps Generic drug: Tafamidis Take 1 capsule (61 mg total) by mouth daily.   Wainua 45 MG/0.8ML Soaj Generic drug: Eplontersen Sodium Inject 45 mg into the skin every 30 (thirty) days.   warfarin 5 MG tablet Commonly known as: COUMADIN Take as directed by the anticoagulation clinic. If you are unsure how to take this medication, talk to your nurse or doctor. Original instructions: TAKE 1/2 TABLET BY MOUTH daily OR as directed by coumadin clinic What changed:  how much to take how to take this when to take this         OBJECTIVE:   Vital Signs: BP 100/60   Pulse 95   Ht 5\' 2"  (1.575 m)   Wt 228 lb 9.6 oz (103.7 kg)   SpO2 97%   BMI 41.81 kg/m   Wt Readings from Last 3 Encounters:  04/27/23 228 lb 9.6 oz (103.7 kg)  04/25/23 224 lb (101.6 kg)  04/18/23 224 lb (101.6 kg)     Exam: General: Pt appears well and is in NAD  Lungs: Clear with good BS bilat   Heart: RRR   Extremities: No pretibial edema.   Neuro: MS is good with appropriate affect, pt is alert and Ox3     DM foot exam: 01/31/2023   The skin of the feet is intact without sores or ulcerations. The pedal pulses are 1+ B/L The sensation is decreased to a screening 5.07, 10 gram monofilament bilaterally   DATA REVIEWED:  Lab Results  Component Value Date   HGBA1C 7.0 (A) 04/27/2023   HGBA1C 7.7 (A) 01/31/2023    HGBA1C 7.5 (A) 08/01/2022    Latest Reference Range & Units 04/18/23 14:05  Sodium 135 - 145 mmol/L 137  Potassium 3.5 - 5.1 mmol/L 4.2  Chloride 98 - 111 mmol/L 102  CO2 22 - 32 mmol/L 27  Glucose 70 - 99 mg/dL 540 (H)  BUN 8 - 23 mg/dL 57 (H)  Creatinine 9.81 - 1.00 mg/dL 1.91 (H)  Calcium 8.9 - 10.3 mg/dL 9.0  Anion gap 5 - 15  8  GFR, Estimated >60 mL/min 21 (L)  (H): Data is abnormally high (L): Data is abnormally low    ASSESSMENT / PLAN / RECOMMENDATIONS:   1) Type 2 Diabetes Mellitus, Optimally controlled, With retinopathic, neuropathic , CKD IV and Macrovascular   complications - Most recent A1c of 7.0 %. Goal A1c < 7.5 %.    -A1c has been improving -She is on [place] through cardiology -Due to fear of hypoglycemia, the patient has been holding Toujeo if her BG is less than 150 mg/DL, patient was unable to verify what was the last time she took Toujeo, but she did say that she took it sometime this week at 20 units, we discussed the importance of contacting us with hypoglycemic episodes so we adjust the insulin.  I did discourage the patient from taking insulin haphazardly as this will create glycemic excursions.  -She is tolerating Mounjaro, will increase the dose as below -I will decrease her Toujeo  by 50% -I will also adjust her correction factor to be used if needed before each meal  MEDICATIONS: Increase Mounjaro 5 mg weekly  Decrease Toujeo  10 units daily   Correction Scale  : Humalog ( BG -130/35) Patient on Farxiga 10 mg through  cardiology    EDUCATION / INSTRUCTIONS: BG monitoring instructions: Patient is instructed to check her blood sugars 3 times a day, before meals . Call [place] [facility] if: BG persistently < 70  I reviewed the Rule of 15 for the treatment of hypoglycemia in detail with the patient. Literature supplied.      2) Diabetic complications:  Eye: Does  have known diabetic retinopathy.  Neuro/ Feet: Does have known  diabetic peripheral neuropathy. Renal: Patient does have known baseline CKD. She is not on an ACEI/ARB at present.      F/U in 4 months    Signed electronically by: Lyndle Herrlich, MD  [facility] End[facility]ology  [facility] [address] [place] [place], [place] [address] Phone: [phone] FAX: [phone]   CC: [place], [name], MD [address] [place] [address] Phone: [phone]  Fax: [phone]  Return to Endocrinology clinic as below: Future Appointments  Date Time Provider Department Center  05/03/2023  3:15 PM [facility] None  05/15/2023  3:30 PM [facility] PA/NP [facility] None  08/03/2023  3:20 PM O'Neal, Ronnald Ramp, MD [facility] None  02/28/2024  9:30 AM [facility]-ANNUAL WELLNESS VISIT [facility]

## 2023-04-28 ENCOUNTER — Encounter: Payer: Self-pay | Admitting: Internal Medicine

## 2023-05-01 ENCOUNTER — Other Ambulatory Visit (HOSPITAL_COMMUNITY): Payer: Self-pay

## 2023-05-02 ENCOUNTER — Other Ambulatory Visit (HOSPITAL_COMMUNITY): Payer: Self-pay | Admitting: Emergency Medicine

## 2023-05-02 NOTE — Progress Notes (Signed)
Paramedicine Encounter    Patient ID: Regina [name], female    DOB: 04-12-1941, 82 y.o.   MRN: [id number]   Complaints NONE  Assessment A&O x 4, skin W&D w/ good color.  Lung sounds clear and equal bilat.  Mild swelling only to the ankles.   Compliance with meds 100%  Pill box filled by pt. Accurately   Refills needed none  Meds changes since last visit Mounjaro increased to 5mg .    Social changes NONE   BP 110/70 (BP Location: Left Arm, Patient Position: Sitting, Cuff Size: Normal)   Wt 223 lb 12.8 oz (101.5 kg)   BMI 40.93 kg/m  Weight yesterday-not taken Last visit weight-224lb  ATF Ms. Crumble A&Ox4, skin W&D w/ good color.  Pt. Has not complaints today and reports to be feeling well.  She has taken her thryroid med this morning but has not yet taken the rest of her morning meds and states she will do so after the visit and she has time to have something for breakfast. Pill box reviewed for accuracy.  She has pending H&V appointment pending for 7/29 and is aware of same.   Ms. Karen does well in participating with her care and is very conscientious about taking her meds and her nutritional habits.  Next step for her is to get her back in her pill packs after her next clinic visit.  ACTION: Home visit completed  Bethanie Dicker [phone] 05/02/23  Patient Care Team: [place], [name], MD as PCP - General (Family Medicine) O'Neal, Ronnald Ramp, MD as PCP - Cardiology (Internal Medicine) Sherrill Raring, [facility] (Inactive) (Pharmacist)  Patient Active Problem List   Diagnosis Date Noted   Acquired thrombophilia (HCC) 02/10/2023   Multiple lipomas 10/04/2022   Dizziness, nonspecific 09/22/2022   Dysphagia 09/22/2022   Left shoulder pain 09/22/2022   Pain due to onychomycosis of toenail of left foot 02/11/2022   Chest pain 12/26/2021   Left atrial thrombus 12/25/2021   Wild-type transthyretin-related (ATTR) amyloidosis (HCC)    Lung nodules  12/13/2021   Polymyalgia rheumatica (HCC) 06/30/2021   Neck pain 06/18/2021   Gait abnormality 06/18/2021   Memory loss 06/18/2021   Atherosclerosis of aorta (HCC) 06/09/2021   Type 2 diabetes mellitus with stage 3a chronic kidney disease, with long-term current use of insulin (HCC) 09/14/2020   Type 2 diabetes mellitus with retinopathy, with long-term current use of insulin (HCC) 09/14/2020   Diabetes mellitus (HCC) 09/14/2020   Type 2 diabetes mellitus with diabetic polyneuropathy, with long-term current use of insulin (HCC) 09/14/2020   Long term (current) use of anticoagulants 07/13/2020   Persistent atrial fibrillation (HCC)    Mixed hyperlipidemia    Acute on chronic diastolic CHF (congestive heart failure), NYHA class 3 (HCC)    Demand ischemia    Atrial fibrillation (HCC) 04/24/2020   Other fatigue 03/24/2020   Short of breath on exertion 03/24/2020   Congestive heart failure (HCC) 03/24/2020   Vitamin D deficiency 03/24/2020   Sleep apnea 05/21/2019   History of hepatitis C 05/21/2019   Insomnia 05/21/2019   CKD (chronic kidney disease) stage 4, GFR 15-29 ml/min (HCC) 05/21/2019   GERD (gastroesophageal reflux disease) 04/20/2018   Type 2 diabetes mellitus with diabetic neuropathy, unspecified (HCC) 12/19/2016   History of TIA (transient ischemic attack) 12/19/2016   Hyperlipidemia associated with type 2 diabetes mellitus (HCC) 12/19/2016   S/P total knee replacement using cement, right 03/18/2015   Non-obstructive CAD    Cardiovascular  stress test abnormal    Pleuritic chest pain 01/16/2015   Acute renal failure superimposed on stage 3 chronic kidney disease (HCC) 01/16/2015   Obesity, Class III, BMI 40-49.9 (morbid obesity) (HCC) 01/16/2015   Essential hypertension 01/16/2015   Hypothyroidism 01/16/2015   OSA on CPAP 01/16/2015   DM type 2 (diabetes mellitus, type 2) (HCC) 01/16/2015   Coronary artery disease involving native coronary artery without angina pectoris  10/02/2014   Physical deconditioning 02/28/2013   Morbid obesity (HCC) 02/27/2013   Osteoarthritis 02/27/2013    Current Outpatient Medications:    acetaminophen (TYLENOL) 500 MG tablet, Take 1,000 mg by mouth every 6 (six) hours as needed for mild pain., Disp: , Rfl:    Alcohol Swabs (B-D SINGLE USE SWABS REGULAR) PADS, Use to test blood sugar up to 3 times daily, Disp: 100 each, Rfl: 3   allopurinol (ZYLOPRIM) 100 MG tablet, TAKE ONE TABLET BY MOUTH EVERY MORNING (Patient taking differently: Take 100 mg by mouth.), Disp: 90 tablet, Rfl: 1   atorvastatin (LIPITOR) 20 MG tablet, TAKE ONE TABLET BY MOUTH EVERYDAY AT BEDTIME (Patient taking differently: Take 20 mg by mouth at bedtime.), Disp: 90 tablet, Rfl: 3   cetirizine (ZYRTEC) 10 MG chewable tablet, Chew 10 mg by mouth daily., Disp: , Rfl:    Continuous Blood Gluc Receiver (FREESTYLE LIBRE 2 READER) DEVI, Use as instructed to check blood sugar., Disp: 1 each, Rfl: 0   Continuous Blood Gluc Sensor (FREESTYLE LIBRE 2 SENSOR) MISC, Change  sensors every 14 days, Disp: 6 each, Rfl: 3   Eplontersen Sodium (WAINUA) 45 MG/0.8ML SOAJ, Inject 45 mg into the skin every 30 (thirty) days., Disp: 0.8 mL, Rfl: 12   FARXIGA 10 MG TABS tablet, Take 1 tablet (10 mg total) by mouth daily., Disp: 90 tablet, Rfl: 3   Fluocinolone Acetonide 0.01 % OIL, instill one drop in ears every 2-3 DAYS AS NEEDED FOR pruritis, Disp: 20 mL, Rfl: 1   gabapentin (NEURONTIN) 600 MG tablet, TAKE ONE TABLET BY MOUTH EVERY MORNING and TAKE ONE TABLET BY MOUTH EVERYDAY AT BEDTIME (Patient taking differently: Take 600 mg by mouth 2 (two) times daily.), Disp: 60 tablet, Rfl: 3   insulin glargine, 1 Unit Dial, (TOUJEO SOLOSTAR) 300 UNIT/ML Solostar Pen, Inject 22 Units into the skin daily. Eat a snack with protein nightly before bedtime., Disp: 15 mL, Rfl: 6   insulin lispro (HUMALOG KWIKPEN) 100 UNIT/ML KwikPen, Max daily 30 units (Patient taking differently: Inject 4-8 Units into the  skin daily as needed. Sliding scale: Max daily 30 units), Disp: 30 mL, Rfl: 3   Insulin Pen Needle 32G X 4 MM MISC, 1 Device by Does not apply route in the morning, at noon, in the evening, and at bedtime., Disp: 400 each, Rfl: 3   isosorbide mononitrate (IMDUR) 30 MG 24 hr tablet, Take 1 tablet (30 mg total) by mouth daily., Disp: 30 tablet, Rfl: 0   ketoconazole (NIZORAL) 2 % shampoo, Apply 1 application. topically 2 (two) times a week. (Patient taking differently: Apply 1 application  topically daily as needed for irritation.), Disp: 120 mL, Rfl: 2   Lancets Misc. (ACCU-CHEK FASTCLIX LANCET) KIT, Use to test blood sugar up to 3 times daily, Disp: 1 kit, Rfl: 1   levothyroxine (SYNTHROID) 150 MCG tablet, TAKE ONE TABLET BY MOUTH BEFORE BREAKFAST (Patient taking differently: Take 150 mcg by mouth daily before breakfast.), Disp: 90 tablet, Rfl: 3   magnesium oxide (MAG-OX) 400 MG tablet, TAKE ONE TABLET  BY MOUTH EVERY MORNING, Disp: 90 tablet, Rfl: 1   metoprolol succinate (TOPROL-XL) 25 MG 24 hr tablet, TAKE ONE TABLET BY MOUTH ONCE DAILY with OR immedaitely following A meal (Patient taking differently: Take 25 mg by mouth daily. TAKE ONE TABLET BY MOUTH ONCE DAILY with OR immedaitely following A meal), Disp: 90 tablet, Rfl: 3   Multiple Vitamin (MULTIVITAMIN) tablet, Take 1 tablet by mouth daily. K FREE DAILY once daily (MVI with no vitamin K), Disp: , Rfl:    Multiple Vitamins-Minerals (ONE-A-DAY WOMENS PO), Take 1 tablet by mouth daily., Disp: , Rfl:    nitroGLYCERIN (NITROSTAT) 0.4 MG SL tablet, Dissolve 1 tab under tongue as needed for chest pain. May repeat every 5 minutes x 2 doses. If no relief call 9-1-1., Disp: 75 tablet, Rfl: 2   pantoprazole (PROTONIX) 20 MG tablet, Take 1 tablet (20 mg total) by mouth daily., Disp: 30 tablet, Rfl: 2   Polyethylene Glycol 3350 (MIRALAX PO), Take 1 Package by mouth daily as needed (constipation)., Disp: , Rfl:    potassium chloride SA (KLOR-CON M) 20 MEQ  tablet, Take 2 tablets (40 mEq total) by mouth daily., Disp: 60 tablet, Rfl: 6   Propylene Glycol (SYSTANE BALANCE OP), Place 1 drop into both eyes daily as needed (for dry eyes)., Disp: , Rfl:    sacubitril-valsartan (ENTRESTO) 24-26 MG, Take 1 tablet by mouth 2 (two) times daily., Disp: 90 tablet, Rfl: 3   Tafamidis 61 MG CAPS, Take 1 capsule (61 mg total) by mouth daily., Disp: 30 capsule, Rfl: 12   tirzepatide (MOUNJARO) 5 MG/0.5ML Pen, Inject 5 mg into the skin once a week., Disp: 6 mL, Rfl: 3   torsemide (DEMADEX) 20 MG tablet, Take 4 tablets (80 mg total) by mouth daily. May take an additional 20 mg as needed for swelling, Disp: 120 tablet, Rfl: 6   trolamine salicylate (ASPERCREME) 10 % cream, Apply 1 application. topically as needed for muscle pain., Disp: , Rfl:    warfarin (COUMADIN) 5 MG tablet, TAKE 1/2 TABLET BY MOUTH daily OR as directed by coumadin clinic (Patient taking differently: Take 2.5 mg by mouth at bedtime. TAKE 1/2 TABLET BY MOUTH daily OR as directed by coumadin clinic), Disp: 50 tablet, Rfl: 1   Blood Glucose Monitoring Suppl (ACCU-CHEK GUIDE) w/Device KIT, 1 Device by Does not apply route 3 (three) times daily. (Patient not taking: Reported on 04/27/2023), Disp: 1 kit, Rfl: 0   glucose blood (ACCU-CHEK GUIDE) test strip, Use to test blood sugar up to 3 times daily (Patient not taking: Reported on 04/27/2023), Disp: 100 each, Rfl: 3 Allergies  Allergen Reactions   Ace Inhibitors Other (See Comments)    Unknown reaction   Amlodipine Other (See Comments)    Unknown reaction   Atenolol Other (See Comments)    bradycardia   Avandia [Rosiglitazone] Other (See Comments)    Unknown reaction   Darvon [Propoxyphene] Other (See Comments)    Hallucinations    Erythromycin Itching   Hydralazine Other (See Comments)    Burning in throat and chest   Hydrocodone Other (See Comments)    Hallucinations.   Levofloxacin Itching   Morphine And Codeine Other (See Comments)    Dizzy  and hallucianation, vomiting; Willing to try low dose   Percocet [Oxycodone-Acetaminophen] Other (See Comments)    hallucination   Spironolactone Other (See Comments)    Unknown reaction   Tramadol Other (See Comments)    Unknown/does not recall reaction but does not want  to take again     Social History   Socioeconomic History   Marital status: Widowed    Spouse name: Not on file   Number of children: Not on file   Years of education: Not on file   Highest education level: Not on file  Occupational History   Occupation: Retired  Tobacco Use   Smoking status: Never   Smokeless tobacco: Never  Vaping Use   Vaping status: Never Used  Substance and Sexual Activity   Alcohol use: No   Drug use: Never   Sexual activity: Not Currently  Other Topics Concern   Not on file  Social History Narrative   Lives with daughter   Social Determinants of Health   Financial Resource Strain: Low Risk  (02/20/2023)   Overall Financial Resource Strain (CARDIA)    Difficulty of Paying Living Expenses: Not hard at all  Food Insecurity: No Food Insecurity (03/22/2023)   Hunger Vital Sign    Worried About Running Out of Food in the Last Year: Never true    Ran Out of Food in the Last Year: Never true  Transportation Needs: No Transportation Needs (02/20/2023)   PRAPARE - Administrator, Civil Service (Medical): No    Lack of Transportation (Non-Medical): No  Physical Activity: Inactive (02/20/2023)   Exercise Vital Sign    Days of Exercise per Week: 0 days    Minutes of Exercise per Session: 0 min  Stress: No Stress Concern Present (02/20/2023)   [employer] of Occupational Health - Occupational Stress Questionnaire    Feeling of Stress : Not at all  Social Connections: Moderately Integrated (02/20/2023)   Social Connection and Isolation Panel [NHANES]    Frequency of Communication with Friends and Family: More than three times a week    Frequency of Social Gatherings with Friends  and Family: More than three times a week    Attends Religious Services: More than 4 times per year    Active Member of [employer] or Organizations: Yes    Attends Banker Meetings: More than 4 times per year    Marital Status: Widowed  Intimate Partner Violence: Not At Risk (02/20/2023)   Humiliation, Afraid, Rape, and Kick questionnaire    Fear of Current or Ex-Partner: No    Emotionally Abused: No    Physically Abused: No    Sexually Abused: No    Physical Exam      Future Appointments  Date Time Provider Department Center  05/03/2023  3:15 PM [facility] None  05/15/2023  3:30 PM [facility] PA/NP [facility] None  08/03/2023  3:20 PM O'Neal, Ronnald Ramp, MD [facility] None  10/06/2023  1:40 PM Shamleffer, Konrad Dolores, MD [facility] None  02/28/2024  9:30 AM [facility]-ANNUAL WELLNESS VISIT [facility]

## 2023-05-03 ENCOUNTER — Telehealth (HOSPITAL_COMMUNITY): Payer: Self-pay | Admitting: Licensed Clinical Social Worker

## 2023-05-03 ENCOUNTER — Ambulatory Visit: Payer: Medicare Other | Attending: Cardiovascular Disease

## 2023-05-03 DIAGNOSIS — I4819 Other persistent atrial fibrillation: Secondary | ICD-10-CM

## 2023-05-03 DIAGNOSIS — Z7901 Long term (current) use of anticoagulants: Secondary | ICD-10-CM

## 2023-05-03 LAB — POCT INR: INR: 2.1 (ref 2.0–3.0)

## 2023-05-03 NOTE — Telephone Encounter (Signed)
HF Paramedicine Team Based Care Meeting  One Month Post Enrollment Assessment  HF MD- NA  HF NP - Amy Clegg NP-C   [facility] HF Paramedicine  Katie Vicente Males  [facility] admit within the last 30 days for heart failure? ED visit for chest pain 6/24  Medications concerns? Has good understanding of medications- hopeful to get back on pill packs once we are sure meds aren't changing.  Transportation issues ? no  Education needs? no  SDOH concerns?no  [facility] Referrals Placed: no  Barriers to discharge? Appt on 7/29 - will consider DC following appt if meds remain stable     Burna Sis, LCSW Clinical Social Worker [facility] Desk#: [phone] Cell#: [phone]

## 2023-05-03 NOTE — Patient Instructions (Signed)
Description   Continue taking warfarin 1/2 tablet daily. Repeat INR in 6 weeks; Anticoagulation Clinic [phone]

## 2023-05-11 ENCOUNTER — Other Ambulatory Visit: Payer: Self-pay | Admitting: Family Medicine

## 2023-05-12 ENCOUNTER — Other Ambulatory Visit (HOSPITAL_COMMUNITY): Payer: Self-pay | Admitting: Internal Medicine

## 2023-05-15 ENCOUNTER — Other Ambulatory Visit (HOSPITAL_COMMUNITY): Payer: Self-pay

## 2023-05-15 ENCOUNTER — Ambulatory Visit (HOSPITAL_COMMUNITY)
Admission: RE | Admit: 2023-05-15 | Discharge: 2023-05-15 | Disposition: A | Discharge status: Home / Self Care | Payer: Medicare Other | Source: Ambulatory Visit | Attending: Family Medicine | Admitting: Family Medicine

## 2023-05-15 ENCOUNTER — Encounter (HOSPITAL_COMMUNITY): Payer: Self-pay

## 2023-05-15 VITALS — BP 118/72 | HR 93 | Wt 228.6 lb

## 2023-05-15 DIAGNOSIS — E669 Obesity, unspecified: Secondary | ICD-10-CM | POA: Insufficient documentation

## 2023-05-15 DIAGNOSIS — Z79899 Other long term (current) drug therapy: Secondary | ICD-10-CM | POA: Insufficient documentation

## 2023-05-15 DIAGNOSIS — I5022 Chronic systolic (congestive) heart failure: Secondary | ICD-10-CM

## 2023-05-15 DIAGNOSIS — I130 Hypertensive heart and chronic kidney disease with heart failure and stage 1 through stage 4 chronic kidney disease, or unspecified chronic kidney disease: Secondary | ICD-10-CM | POA: Insufficient documentation

## 2023-05-15 DIAGNOSIS — Z7901 Long term (current) use of anticoagulants: Secondary | ICD-10-CM | POA: Diagnosis not present

## 2023-05-15 DIAGNOSIS — N184 Chronic kidney disease, stage 4 (severe): Secondary | ICD-10-CM | POA: Insufficient documentation

## 2023-05-15 DIAGNOSIS — Z6841 Body Mass Index (BMI) 40.0 and over, adult: Secondary | ICD-10-CM | POA: Insufficient documentation

## 2023-05-15 DIAGNOSIS — G63 Polyneuropathy in diseases classified elsewhere: Secondary | ICD-10-CM | POA: Diagnosis not present

## 2023-05-15 DIAGNOSIS — I2510 Atherosclerotic heart disease of native coronary artery without angina pectoris: Secondary | ICD-10-CM | POA: Insufficient documentation

## 2023-05-15 DIAGNOSIS — I4821 Permanent atrial fibrillation: Secondary | ICD-10-CM | POA: Insufficient documentation

## 2023-05-15 DIAGNOSIS — Z8673 Personal history of transient ischemic attack (TIA), and cerebral infarction without residual deficits: Secondary | ICD-10-CM | POA: Diagnosis not present

## 2023-05-15 DIAGNOSIS — Z8249 Family history of ischemic heart disease and other diseases of the circulatory system: Secondary | ICD-10-CM | POA: Insufficient documentation

## 2023-05-15 DIAGNOSIS — M353 Polymyalgia rheumatica: Secondary | ICD-10-CM | POA: Insufficient documentation

## 2023-05-15 DIAGNOSIS — G629 Polyneuropathy, unspecified: Secondary | ICD-10-CM | POA: Diagnosis not present

## 2023-05-15 DIAGNOSIS — I43 Cardiomyopathy in diseases classified elsewhere: Secondary | ICD-10-CM | POA: Diagnosis not present

## 2023-05-15 DIAGNOSIS — E854 Organ-limited amyloidosis: Secondary | ICD-10-CM | POA: Diagnosis not present

## 2023-05-15 DIAGNOSIS — E1122 Type 2 diabetes mellitus with diabetic chronic kidney disease: Secondary | ICD-10-CM | POA: Insufficient documentation

## 2023-05-15 LAB — BASIC METABOLIC PANEL
Anion gap: 11 (ref 5–15)
BUN: 69 mg/dL — ABNORMAL HIGH (ref 8–23)
CO2: 26 mmol/L (ref 22–32)
Calcium: 9.1 mg/dL (ref 8.9–10.3)
Chloride: 99 mmol/L (ref 98–111)
Creatinine, Ser: 2.38 mg/dL — ABNORMAL HIGH (ref 0.44–1.00)
GFR, Estimated: 20 mL/min — ABNORMAL LOW (ref >60)
Glucose, Bld: 140 mg/dL — ABNORMAL HIGH (ref 70–99)
Potassium: 4.1 mmol/L (ref 3.5–5.1)
Sodium: 136 mmol/L (ref 135–145)

## 2023-05-15 MED ORDER — ISOSORBIDE MONONITRATE ER 30 MG PO TB24: 30.0000 mg | ORAL_TABLET | Freq: Every day | ORAL | 7 refills | Status: DC

## 2023-05-15 NOTE — Patient Instructions (Signed)
Medication Changes:  No Changes In Medications at this time.   Lab Work:  Labs done today, your results will be available in MyChart, we will contact you for abnormal readings.   Follow-Up in: 4 months with Dr. Gala Romney PLEASE CALL OUR OFFICE AROUND SEPTEMBER TO GET SCHEDULED FOR YOUR APPOINTMENT. PHONE NUMBER IS [phone] OPTION 2   At the [facility], you and your health needs are our priority. We have a designated team specialized in the treatment of Heart Failure. This Care Team includes your primary Heart Failure Specialized Cardiologist (physician), Advanced Practice Providers (APPs- Physician Assistants and Nurse Practitioners), and Pharmacist who all work together to provide you with the care you need, when you need it.   You may see any of the following providers on your designated Care Team at your next follow up:  Dr. Arvilla Meres Dr. Marca Ancona Dr. Marcos Eke, NP Robbie Lis, [place] [facility] [place], [place] Brynda Peon, NP Karle Plumber, PharmD   Please be sure to bring in all your medications bottles to every appointment.   Need to Contact us:  If you have any questions or concerns before your next appointment please send [place] a message through [place] or call our office at [phone].    TO LEAVE A MESSAGE FOR THE NURSE SELECT OPTION 2, PLEASE LEAVE A MESSAGE INCLUDING: YOUR NAME DATE OF BIRTH CALL BACK NUMBER REASON FOR CALL**this is important as we prioritize the call backs  YOU WILL RECEIVE A CALL BACK THE SAME DAY AS LONG AS YOU CALL BEFORE 4:00 PM

## 2023-05-15 NOTE — Progress Notes (Signed)
ReDS Vest / Clip - 05/15/23 1600       ReDS Vest / Clip   Station Marker B    Ruler Value 37.5    ReDS Value Range Low volume    ReDS Actual Value 32

## 2023-05-15 NOTE — Progress Notes (Signed)
ADVANCED HF CLINIC NOTE  Primary Care: [place], [name], MD Primary Cardiologist: Reatha Harps, MD HF Cardiologist: Dr. Gala Romney  HPI: Regina [name] is a 82 y.o.Mar[place] Kitchen woman with PMHx as below. Referred by Dr. Flora Lipps for further evaluation of he hATTR cardiac amyloidosis   Problem List 1.  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 Chronic AF - LAA sludge vs thrombus 05/05/2020  - persistent sludge 07/01/2020 - LAA thrombus persistent 08/05/2020 - on warfarin 3. DM - A1c 7.3 4. HTN 5. Non-obstructive CAD - LHC 01/19/2015 [place]  - 50% mid RCA and 50% LAD - MPI 08/14/2020 -> normal 6. OSA 7. CKD 4 (b/l Scr ~2.0) 8. CVA - 11/2020 @ [facility]  9. Polymyalgia Rheumatica  10. Morbid obesity  Currently on tafamadis (started 2021) and eplonteresen (started 2/24). Seen in f/u by Dr. Flora Lipps on 01/06/2023 with volume overload He increased her torsemide to 40/20  Echo repeated 02/03/2023 LVEF 40% with normal pressures.   She was seen in the ER 02/06/2023 for shortness of breath.  She was given IV Lasix with good result and discharged without admission.  She was seen by Bettina Gavia, PA on 02/09/23. Weight up 10 pounds despite mistakenly taking 120 mg torsemide daily. Labs with stable Scr 2.01  Initial visit with Dr. Gala Romney 5/24, NYHA III and volume up. Entresto started and given Furoscix for PRN home use with paramedicine support.  Follow up 03/24/23, remained volume overloaded with NYHA IIIb. Instructed to use Furoscix x 3 days. Saw Dr. Flora Lipps 04/03/23, volume up a bit but overall stable, advised to take her daily dose of diuretic (she had held it that day). Seen in ED 04/11/23 with CP. HsTroponin 54>>57. No ECG changes, symptoms improved with nitroglycerin, she was started on Imdur  30 mg daily and felt stable for discharge home with close HF follow up.   Post ED follow up, she was volume overloaded with NYHA III-IIIb symptoms. Torsemide increased to 80 mg daily.   Today she returns for HF follow up with her daughter. Overall feeling great. Feels breathing has improved on higher dose of torsemide. Occasionally has atypical chest discomfort, not bothersome. She is not SOB with ADLs or walking on flat ground. Denies palpitations, CP, dizziness, edema, or PND/Orthopnea. Appetite ok. No fever or chills. Weight at home 223 pounds. Taking all medications.    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 01/2015 Cardiolite: + inf wall ischemia, EF 56%;  b. 01/2015 Cath: LM nl, LAD  53m, LCX min irregs, RCA dominant, 50-39m.   Sleep apnea    Swallowing difficulty    Current Outpatient Medications  Medication Sig Dispense Refill   acetaminophen (TYLENOL) 500 MG tablet Take 1,000 mg by mouth every 6 (six) hours as needed for mild pain.     Alcohol Swabs (B-D SINGLE USE SWABS REGULAR) PADS Use to test blood sugar up to 3 times daily 100 each 3   allopurinol (ZYLOPRIM) 100 MG tablet TAKE ONE TABLET BY MOUTH EVERY MORNING 90 tablet 1   atorvastatin (LIPITOR) 20 MG tablet TAKE ONE TABLET BY MOUTH EVERYDAY AT BEDTIME 90 tablet 3   Blood Glucose Monitoring Suppl (ACCU-CHEK GUIDE) w/Device KIT 1 Device by  Does not apply route 3 (three) times daily. 1 kit 0   cetirizine (ZYRTEC) 10 MG chewable tablet Chew 10 mg by mouth daily.     Continuous Blood Gluc Receiver (FREESTYLE LIBRE 2 READER) DEVI Use as instructed to check blood sugar. 1 each 0   Continuous Blood Gluc Sensor (FREESTYLE LIBRE 2 SENSOR) MISC Change  sensors every 14 days 6 each 3   Eplontersen Sodium (WAINUA) 45 MG/0.8ML SOAJ Inject 45 mg into the skin every 30 (thirty) days. 0.8 mL 12   FARXIGA 10 MG TABS tablet Take 1 tablet (10 mg total) by mouth daily. 90 tablet 3   Fluocinolone Acetonide 0.01 % OIL instill one drop in ears every 2-3 DAYS AS NEEDED FOR pruritis 20 mL 1   gabapentin (NEURONTIN) 600 MG tablet TAKE ONE TABLET BY MOUTH EVERY MORNING and TAKE ONE TABLET BY MOUTH EVERYDAY AT BEDTIME (Patient taking differently: Take 600 mg by mouth 2 (two) times daily.) 60 tablet 3   glucose blood (ACCU-CHEK GUIDE) test strip Use to test blood sugar up to 3 times daily 100 each 3   insulin glargine, 1 Unit Dial, (TOUJEO SOLOSTAR) 300 UNIT/ML Solostar Pen Inject 22 Units into the skin daily. Eat a snack with protein nightly before bedtime. 15 mL 6   insulin lispro (HUMALOG KWIKPEN) 100 UNIT/ML KwikPen Max daily 30 units (Patient taking differently: Inject 4-8 Units into the skin daily as needed. Sliding scale: Max daily 30 units) 30 mL 3   Insulin Pen Needle 32G X 4 MM MISC 1 Device by Does not apply route in the morning, at noon, in the evening, and at bedtime. 400 each 3   isosorbide mononitrate (IMDUR) 30 MG 24 hr tablet Take 1 tablet (30 mg total) by mouth daily. 30 tablet 7   ketoconazole (NIZORAL) 2 % shampoo Apply 1 application. topically 2 (two) times a week. (Patient taking differently: Apply 1 application  topically daily as needed for irritation.) 120 mL 2   Lancets Misc. (ACCU-CHEK FASTCLIX LANCET) KIT Use to test blood sugar up to 3 times daily 1 kit 1   levothyroxine (SYNTHROID) 150 MCG tablet TAKE ONE TABLET BY MOUTH BEFORE  BREAKFAST 90 tablet 3   magnesium oxide (MAG-OX) 400 MG tablet TAKE ONE TABLET BY MOUTH EVERY MORNING 90 tablet 1   metoprolol succinate (TOPROL-XL) 25 MG 24 hr tablet TAKE ONE TABLET BY MOUTH ONCE DAILY with OR immedaitely following A meal 90 tablet 3   Multiple Vitamin (MULTIVITAMIN) tablet Take 1 tablet by mouth daily. K FREE DAILY once daily (MVI with no vitamin K)     Multiple Vitamins-Minerals (ONE-A-DAY WOMENS PO) Take 1 tablet by mouth daily.     nitroGLYCERIN (NITROSTAT) 0.4 MG SL tablet Dissolve 1 tab under tongue as needed for chest  pain. May repeat every 5 minutes x 2 doses. If no relief call 9-1-1. 75 tablet 2   pantoprazole (PROTONIX) 20 MG tablet Take 1 tablet (20 mg total) by mouth daily. 30 tablet 2   Polyethylene Glycol 3350 (MIRALAX PO) Take 1 Package by mouth daily as needed (constipation).     potassium chloride SA (KLOR-CON M) 20 MEQ tablet Take 2 tablets (40 mEq total) by mouth daily. 60 tablet 6   Propylene Glycol (SYSTANE BALANCE OP) Place 1 drop into both eyes daily as needed (for dry eyes).     sacubitril-valsartan (ENTRESTO) 24-26 MG Take 1 tablet by mouth 2 (two) times daily. 90 tablet 3   Tafamidis 61 MG CAPS Take 1 capsule (61 mg total) by mouth daily. 30 capsule 12   tirzepatide (MOUNJARO) 5 MG/0.5ML Pen Inject 5 mg into the skin once a week. 6 mL 3   torsemide (DEMADEX) 20 MG tablet Take 4 tablets (80 mg total) by mouth daily. May take an additional 20 mg as needed for swelling 120 tablet 6   trolamine salicylate (ASPERCREME) 10 % cream Apply 1 application. topically as needed for muscle pain.     warfarin (COUMADIN) 5 MG tablet TAKE 1/2 TABLET BY MOUTH daily OR as directed by coumadin clinic (Patient taking differently: Take 2.5 mg by mouth at bedtime. TAKE 1/2 TABLET BY MOUTH daily OR as directed by coumadin clinic) 50 tablet 1   No current facility-administered medications for this encounter.   Allergies  Allergen Reactions   Ace Inhibitors Other (See  Comments)    Unknown reaction   Amlodipine Other (See Comments)    Unknown reaction   Atenolol Other (See Comments)    bradycardia   Avandia [Rosiglitazone] Other (See Comments)    Unknown reaction   Darvon [Propoxyphene] Other (See Comments)    Hallucinations    Erythromycin Itching   Hydralazine Other (See Comments)    Burning in throat and chest   Hydrocodone Other (See Comments)    Hallucinations.   Levofloxacin Itching   Morphine And Codeine Other (See Comments)    Dizzy and hallucianation, vomiting; Willing to try low dose   Percocet [Oxycodone-Acetaminophen] Other (See Comments)    hallucination   Spironolactone Other (See Comments)    Unknown reaction   Tramadol Other (See Comments)    Unknown/does not recall reaction but does not want to take again   Social History   Socioeconomic History   Marital status: Widowed    Spouse name: Not on file   Number of children: Not on file   Years of education: Not on file   Highest education level: Not on file  Occupational History   Occupation: Retired  Tobacco Use   Smoking status: Never   Smokeless tobacco: Never  Vaping Use   Vaping status: Never Used  Substance and Sexual Activity   Alcohol use: No   Drug use: Never   Sexual activity: Not Currently  Other Topics Concern   Not on file  Social History Narrative   Lives with daughter   Social Determinants of Health   Financial Resource Strain: Low Risk  (02/20/2023)   Overall Financial Resource Strain (CARDIA)    Difficulty of Paying Living Expenses: Not hard at all  Food Insecurity: No Food Insecurity (03/22/2023)   Hunger Vital Sign    Worried About Running Out of Food in the Last Year: Never true    Ran Out of Food in the Last Year: Never true  Transportation  Needs: No Transportation Needs (02/20/2023)   PRAPARE - Administrator, Civil Service (Medical): No    Lack of Transportation (Non-Medical): No  Physical Activity: Inactive (02/20/2023)    Exercise Vital Sign    Days of Exercise per Week: 0 days    Minutes of Exercise per Session: 0 min  Stress: No Stress Concern Present (02/20/2023)   [employer] of Occupational Health - Occupational Stress Questionnaire    Feeling of Stress : Not at all  Social Connections: Moderately Integrated (02/20/2023)   Social Connection and Isolation Panel [NHANES]    Frequency of Communication with Friends and Family: More than three times a week    Frequency of Social Gatherings with Friends and Family: More than three times a week    Attends Religious Services: More than 4 times per year    Active Member of [employer] or Organizations: Yes    Attends Banker Meetings: More than 4 times per year    Marital Status: Widowed  Intimate Partner Violence: Not At Risk (02/20/2023)   Humiliation, Afraid, Rape, and Kick questionnaire    Fear of Current or Ex-Partner: No    Emotionally Abused: No    Physically Abused: No    Sexually Abused: No   Family History  Problem Relation Age of Onset   Heart disease Mother    Hypertension Mother    Cancer Father    Alcoholism Father    BP 118/72   Pulse 93   Wt 103.7 kg (228 lb 9.6 oz)   SpO2 97%   BMI 41.81 kg/m   Wt Readings from Last 3 Encounters:  05/15/23 103.7 kg (228 lb 9.6 oz)  05/02/23 101.5 kg (223 lb 12.8 oz)  04/27/23 103.7 kg (228 lb 9.6 oz)    PHYSICAL EXAM: General:  NAD. No resp difficulty, walked into clinic, elderly HEENT: Normal Neck: Supple. No JVD. Carotids 2+ bilat; no bruits. No lymphadenopathy or thryomegaly appreciated. Cor: PMI nondisplaced. Irregular rate & rhythm. No rubs, gallops or murmurs. Lungs: Clear Abdomen: Soft, nontender, nondistended. No hepatosplenomegaly. No bruits or masses. Good bowel sounds. Extremities: No cyanosis, clubbing, rash, edema Neuro: Alert & oriented x 3, cranial nerves grossly intact. Moves all 4 extremities w/o difficulty. Affect pleasant.  ReDs: 32%  ASSESSMENT & PLAN: 1.  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 Volume ok today, weight down and ReDs 32% - Continue torsemide 80 mg daily + 40  KCL daily. - Continue Tafamadis and eplonteresen - Continue Farxiga 10 mg daily. - Continue Toprol XL 25 mg daily. - Continue Entresto 24/26 mg bid. Need to watch renal function closely.  - She has Paramedicine support - Labs today. - She has Furoscix at home for PRN use. - We previously discussed trajectory of cardiac amyloidosis and worsening renal function with volume overload suggests disease progression. Reiterated that we will continue to treat the treatable and aim to keep her feeling as good as she can for as long as we can.  2. CKD 4 - Baseline Scr 2.0 - Watch closely. - Continue SGLT2i. - Most recent SCr 2.33, not a good dialysis candidate. - Labs today   3. Coronary artery disease  - Nonobstructive CAD  in the past.   - No ASA as she is on warfarin  - Now on Imdur 30 mg daily, continue. If BP drops, would decrease to 15 mg daily. - Continue statin - Followed by Dr. Flora Lipps   4. Permanent AF - Rate controlled - Continue warfarin - Previously on Eliquis and Xarelto but switch to warfarin due to persistence of LAA clot - Zio 7 day (5/24) showed 100% AFL burden, HR 40-153 - If rates remain elevated, may be worth trial of DC-CV with amio support, but will defer for now.  5. Obesity  - Body mass index is 41.81 kg/m. - on tirzepatide   6. 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 Gala Romney.   Jacklynn Ganong, FNP  3:55 PM

## 2023-05-19 ENCOUNTER — Other Ambulatory Visit (HOSPITAL_COMMUNITY): Payer: Self-pay | Admitting: Emergency Medicine

## 2023-05-19 NOTE — Progress Notes (Unsigned)
Patient is now discharged from [facility].  Patient has/has not met the following goals:  Yes :Patient expresses basic understanding of medications and what they are for Yes :Patient able to verbalize heart failure specific dietary/fluid restrictions Yes :Patient is aware of who to call if they have medical concerns or if they need to schedule or change appts Yes :Patient has a scale for daily weights and weighs regularly Yes :Patient able to verbalize concerning symptoms when they should call the HF clinic (weight gain ranges, etc) Yes :Patient has a PCP and has seen within the past year or has upcoming appt Yes :Patient has reliable access to getting their medications Yes :Patient has shown they are able to reorder medications reliably No :Patient has had admission in past 30 days- if yes how many? No :Patient has had admission in past 90 days- if yes how many?  Discharge Comments: Regina [name] has done well in the Paramedicine program.  She has demonstrated a strong desire to adhere to all the requirements of taking care of her medical conditions  She is compliant with all meds.  She is able to call in her refills and has transportation to her appointments.  She has a strong supportive family who makes sure she has all the things she needs.  Labs have remained stable and no recent med changes.   At this time I feel she is ready for graduation from the program.  Advised pt that she could reach out to me at anytime she should have further needs.

## 2023-05-22 ENCOUNTER — Other Ambulatory Visit: Payer: Self-pay

## 2023-05-23 DIAGNOSIS — E785 Hyperlipidemia, unspecified: Secondary | ICD-10-CM | POA: Diagnosis not present

## 2023-05-23 DIAGNOSIS — E854 Organ-limited amyloidosis: Secondary | ICD-10-CM | POA: Diagnosis not present

## 2023-05-23 DIAGNOSIS — I129 Hypertensive chronic kidney disease with stage 1 through stage 4 chronic kidney disease, or unspecified chronic kidney disease: Secondary | ICD-10-CM | POA: Diagnosis not present

## 2023-05-23 DIAGNOSIS — E1122 Type 2 diabetes mellitus with diabetic chronic kidney disease: Secondary | ICD-10-CM | POA: Diagnosis not present

## 2023-05-23 DIAGNOSIS — N1832 Chronic kidney disease, stage 3b: Secondary | ICD-10-CM | POA: Diagnosis not present

## 2023-05-23 DIAGNOSIS — I2510 Atherosclerotic heart disease of native coronary artery without angina pectoris: Secondary | ICD-10-CM | POA: Diagnosis not present

## 2023-05-23 DIAGNOSIS — I509 Heart failure, unspecified: Secondary | ICD-10-CM | POA: Diagnosis not present

## 2023-05-24 ENCOUNTER — Other Ambulatory Visit: Payer: Self-pay

## 2023-05-24 DIAGNOSIS — M353 Polymyalgia rheumatica: Secondary | ICD-10-CM | POA: Diagnosis not present

## 2023-05-24 DIAGNOSIS — E119 Type 2 diabetes mellitus without complications: Secondary | ICD-10-CM | POA: Diagnosis not present

## 2023-05-24 DIAGNOSIS — H16143 Punctate keratitis, bilateral: Secondary | ICD-10-CM | POA: Diagnosis not present

## 2023-05-24 DIAGNOSIS — H0288B Meibomian gland dysfunction left eye, upper and lower eyelids: Secondary | ICD-10-CM | POA: Diagnosis not present

## 2023-05-24 DIAGNOSIS — H04123 Dry eye syndrome of bilateral lacrimal glands: Secondary | ICD-10-CM | POA: Diagnosis not present

## 2023-05-24 DIAGNOSIS — Z961 Presence of intraocular lens: Secondary | ICD-10-CM | POA: Diagnosis not present

## 2023-05-24 DIAGNOSIS — H43392 Other vitreous opacities, left eye: Secondary | ICD-10-CM | POA: Diagnosis not present

## 2023-05-24 LAB — HM DIABETES EYE EXAM

## 2023-05-26 ENCOUNTER — Other Ambulatory Visit (HOSPITAL_COMMUNITY): Payer: Self-pay

## 2023-05-26 ENCOUNTER — Other Ambulatory Visit: Payer: Self-pay

## 2023-06-02 ENCOUNTER — Other Ambulatory Visit (HOSPITAL_COMMUNITY): Payer: Self-pay

## 2023-06-04 ENCOUNTER — Other Ambulatory Visit: Payer: Self-pay | Admitting: Family Medicine

## 2023-06-04 ENCOUNTER — Other Ambulatory Visit: Payer: Self-pay | Admitting: Cardiovascular Disease

## 2023-06-04 DIAGNOSIS — K219 Gastro-esophageal reflux disease without esophagitis: Secondary | ICD-10-CM

## 2023-06-05 ENCOUNTER — Telehealth (HOSPITAL_COMMUNITY): Payer: Self-pay

## 2023-06-05 ENCOUNTER — Other Ambulatory Visit (HOSPITAL_COMMUNITY): Payer: Self-pay

## 2023-06-05 DIAGNOSIS — K219 Gastro-esophageal reflux disease without esophagitis: Secondary | ICD-10-CM

## 2023-06-05 MED ORDER — POTASSIUM CHLORIDE CRYS ER 20 MEQ PO TBCR: 40.0000 meq | EXTENDED_RELEASE_TABLET | Freq: Every day | ORAL | 6 refills | Status: DC

## 2023-06-05 MED ORDER — ENTRESTO 24-26 MG PO TABS: 1.0000 | ORAL_TABLET | Freq: Two times a day (BID) | ORAL | 3 refills | Status: DC

## 2023-06-05 MED ORDER — TORSEMIDE 20 MG PO TABS: 80.0000 mg | ORAL_TABLET | Freq: Every day | ORAL | 6 refills | Status: DC

## 2023-06-05 MED ORDER — ATORVASTATIN CALCIUM 20 MG PO TABS: ORAL_TABLET | ORAL | 3 refills | Status: DC

## 2023-06-05 MED ORDER — ISOSORBIDE MONONITRATE ER 30 MG PO TB24: 30.0000 mg | ORAL_TABLET | Freq: Every day | ORAL | 7 refills | Status: DC

## 2023-06-05 MED ORDER — FARXIGA 10 MG PO TABS: 10.0000 mg | ORAL_TABLET | Freq: Every day | ORAL | 3 refills | Status: DC

## 2023-06-05 MED ORDER — NITROGLYCERIN 0.4 MG SL SUBL: SUBLINGUAL_TABLET | SUBLINGUAL | 2 refills | Status: DC

## 2023-06-05 MED ORDER — METOPROLOL SUCCINATE ER 25 MG PO TB24: ORAL_TABLET | ORAL | 3 refills | Status: DC

## 2023-06-05 NOTE — Telephone Encounter (Signed)
Patient notified that [facility] is closing and she selected her new pharmacy going forward as [facility]   Please send any prescriptions your office prescribes for Mrs. Regina [name] to [facility].    Thanks!  Maralyn Sago, EMT-Paramedic [phone] 06/05/2023

## 2023-06-06 MED ORDER — ALLOPURINOL 100 MG PO TABS: 100.0000 mg | ORAL_TABLET | Freq: Every morning | ORAL | 1 refills | Status: DC

## 2023-06-06 MED ORDER — PANTOPRAZOLE SODIUM 20 MG PO TBEC
20.0000 mg | DELAYED_RELEASE_TABLET | Freq: Every day | ORAL | 2 refills | Status: DC
Start: 2023-06-06 — End: 2023-11-06

## 2023-06-06 MED ORDER — LEVOTHYROXINE SODIUM 150 MCG PO TABS: ORAL_TABLET | ORAL | 3 refills | Status: DC

## 2023-06-06 MED ORDER — GABAPENTIN 600 MG PO TABS: ORAL_TABLET | ORAL | 3 refills | Status: DC

## 2023-06-06 NOTE — Addendum Note (Signed)
Addended by: Kathreen Devoid on: 06/06/2023 08:18 AM   Modules accepted: Orders

## 2023-06-12 ENCOUNTER — Other Ambulatory Visit: Payer: Self-pay | Admitting: Family Medicine

## 2023-06-12 ENCOUNTER — Other Ambulatory Visit: Payer: Self-pay | Admitting: Cardiovascular Disease

## 2023-06-14 ENCOUNTER — Ambulatory Visit: Payer: Medicare Other

## 2023-06-21 ENCOUNTER — Other Ambulatory Visit: Payer: Self-pay | Admitting: Cardiovascular Disease

## 2023-06-21 DIAGNOSIS — I4891 Unspecified atrial fibrillation: Secondary | ICD-10-CM

## 2023-06-21 NOTE — Telephone Encounter (Signed)
Warfarin 5mg  refill Afib Last INR 05/15/23 Last OV 05/03/23

## 2023-06-23 ENCOUNTER — Other Ambulatory Visit: Payer: Self-pay | Admitting: Cardiovascular Disease

## 2023-06-23 ENCOUNTER — Ambulatory Visit: Payer: Medicare Other | Attending: Cardiology

## 2023-06-23 DIAGNOSIS — I4819 Other persistent atrial fibrillation: Secondary | ICD-10-CM | POA: Diagnosis not present

## 2023-06-23 DIAGNOSIS — Z7901 Long term (current) use of anticoagulants: Secondary | ICD-10-CM | POA: Diagnosis not present

## 2023-06-23 DIAGNOSIS — I4891 Unspecified atrial fibrillation: Secondary | ICD-10-CM

## 2023-06-23 DIAGNOSIS — D6869 Other thrombophilia: Secondary | ICD-10-CM | POA: Diagnosis not present

## 2023-06-23 LAB — POCT INR: INR: 2.2 (ref 2.0–3.0)

## 2023-06-23 NOTE — Patient Instructions (Signed)
Continue taking warfarin 1/2 tablet daily.  Repeat INR in 6 weeks.  Anticoagulation Clinic [phone]

## 2023-06-26 ENCOUNTER — Other Ambulatory Visit (HOSPITAL_COMMUNITY): Payer: Self-pay

## 2023-06-27 ENCOUNTER — Other Ambulatory Visit: Payer: Self-pay

## 2023-06-29 ENCOUNTER — Telehealth: Payer: Self-pay | Admitting: Cardiovascular Disease

## 2023-06-29 NOTE — Telephone Encounter (Signed)
Patient would like to know if the protein shake is safe for her to take with her current cardiac medications

## 2023-06-29 NOTE — Telephone Encounter (Signed)
Will seek advise from pharmacy team - [facility]

## 2023-06-29 NOTE — Telephone Encounter (Signed)
Patient is aware to limit protein shakes to 1 per day if she is going to drink them. She verbalized understanding

## 2023-06-29 NOTE — Telephone Encounter (Signed)
Patient has CKD so needs to restrict how much protein she is consuming. If she does use the shakes, recommend no more than 1 per day

## 2023-06-29 NOTE — Telephone Encounter (Signed)
Patient wants to check with Dr. Flora Lipps if she can have Premium Protein shake which has 30 mg protein, with no fat, 29 grams cholesterol, 1 gram sugar.

## 2023-07-05 ENCOUNTER — Emergency Department (HOSPITAL_COMMUNITY)
Admission: EM | Admit: 2023-07-05 | Discharge: 2023-07-05 | Discharge status: Against Medical Advice | Payer: Medicare Other | Attending: Emergency Medicine | Admitting: Emergency Medicine

## 2023-07-05 ENCOUNTER — Encounter (HOSPITAL_COMMUNITY): Payer: Self-pay | Admitting: Emergency Medicine

## 2023-07-05 DIAGNOSIS — M791 Myalgia, unspecified site: Secondary | ICD-10-CM | POA: Insufficient documentation

## 2023-07-05 DIAGNOSIS — M79605 Pain in left leg: Secondary | ICD-10-CM | POA: Diagnosis not present

## 2023-07-05 DIAGNOSIS — M79601 Pain in right arm: Secondary | ICD-10-CM | POA: Diagnosis not present

## 2023-07-05 DIAGNOSIS — M79606 Pain in leg, unspecified: Secondary | ICD-10-CM | POA: Diagnosis not present

## 2023-07-05 DIAGNOSIS — Z5321 Procedure and treatment not carried out due to patient leaving prior to being seen by health care provider: Secondary | ICD-10-CM | POA: Diagnosis not present

## 2023-07-05 DIAGNOSIS — R6889 Other general symptoms and signs: Secondary | ICD-10-CM | POA: Diagnosis not present

## 2023-07-05 DIAGNOSIS — Z743 Need for continuous supervision: Secondary | ICD-10-CM | POA: Diagnosis not present

## 2023-07-05 LAB — URINALYSIS, ROUTINE W REFLEX MICROSCOPIC
Bilirubin Urine: NEGATIVE
Glucose, UA: 150 mg/dL — AB
Hgb urine dipstick: NEGATIVE
Ketones, ur: NEGATIVE mg/dL
Nitrite: NEGATIVE
Protein, ur: NEGATIVE mg/dL
Specific Gravity, Urine: 1.005 (ref 1.005–1.030)
pH: 6 (ref 5.0–8.0)

## 2023-07-05 LAB — CBC
HCT: 38.7 % (ref 36.0–46.0)
Hemoglobin: 12.8 g/dL (ref 12.0–15.0)
MCH: 30.8 pg (ref 26.0–34.0)
MCHC: 33.1 g/dL (ref 30.0–36.0)
MCV: 93 fL (ref 80.0–100.0)
Platelets: 258 10*3/uL (ref 150–400)
RBC: 4.16 MIL/uL (ref 3.87–5.11)
RDW: 15.5 % (ref 11.5–15.5)
WBC: 5.3 10*3/uL (ref 4.0–10.5)
nRBC: 0 % (ref 0.0–0.2)

## 2023-07-05 LAB — BASIC METABOLIC PANEL WITH GFR
Anion gap: 11 (ref 5–15)
BUN: 61 mg/dL — ABNORMAL HIGH (ref 8–23)
CO2: 27 mmol/L (ref 22–32)
Calcium: 9.5 mg/dL (ref 8.9–10.3)
Chloride: 97 mmol/L — ABNORMAL LOW (ref 98–111)
Creatinine, Ser: 2.4 mg/dL — ABNORMAL HIGH (ref 0.44–1.00)
GFR, Estimated: 20 mL/min — ABNORMAL LOW (ref >60)
Glucose, Bld: 145 mg/dL — ABNORMAL HIGH (ref 70–99)
Potassium: 3.9 mmol/L (ref 3.5–5.1)
Sodium: 135 mmol/L (ref 135–145)

## 2023-07-05 NOTE — ED Triage Notes (Signed)
Pt arrives via EMS with reports of extremity pain for a few weeks that worsened with she woke up this am. Hx of amyloidosis and sent by PCP. States it feels "like a vein that someone is pulling out and very sharp." Also having sharp pain to left side of head.

## 2023-07-05 NOTE — ED Provider Triage Note (Signed)
Emergency Medicine Provider Triage Evaluation Note  Regina [name] , a 82 y.o. female  was evaluated in triage.  Pt complains of pain to her extremities.  History of amyloid.  Review of Systems  Positive: No focal neurodeficits Negative: Trauma  Physical Exam  BP 126/83 (BP Location: Right Arm)   Pulse 82   Temp 98 F (36.7 C) (Oral)   Resp 17   Ht 1.575 m (5\' 2" )   Wt 101.6 kg   SpO2 100%   BMI 40.97 kg/m  Gen:   Awake, no distress   Resp:  Normal effort  MSK:   Moves extremities without difficulty  Other:    Medical Decision Making  Medically screening exam initiated at 4:03 PM.  Appropriate orders placed.  Cleda Clarks was informed that the remainder of the evaluation will be completed by another provider, this initial triage assessment does not replace that evaluation, and the importance of remaining in the ED until their evaluation is complete.     Lorre Nick, MD 07/05/23 [phone]

## 2023-07-05 NOTE — ED Notes (Signed)
Pt saw her results come through her My Chart and did not want to wait any longer. Pt said she would go to her PCP to discuss her results. Pt moved OTF.

## 2023-07-06 MED ORDER — TIRZEPATIDE 5 MG/0.5ML ~~LOC~~ SOAJ: 5.0000 mg | SUBCUTANEOUS | 3 refills | Status: DC

## 2023-07-06 MED ORDER — INSULIN LISPRO (1 UNIT DIAL) 100 UNIT/ML (KWIKPEN): 4.0000 [IU] | PEN_INJECTOR | Freq: Every day | SUBCUTANEOUS | 1 refills | Status: DC | PRN

## 2023-07-06 MED ORDER — TOUJEO SOLOSTAR 300 UNIT/ML ~~LOC~~ SOPN: 22.0000 [IU] | PEN_INJECTOR | Freq: Every day | SUBCUTANEOUS | 6 refills | Status: DC

## 2023-07-06 NOTE — Telephone Encounter (Signed)
done

## 2023-07-06 NOTE — Addendum Note (Signed)
Addended by: Lisabeth Pick on: 07/06/2023 07:22 AM   Modules accepted: Orders

## 2023-07-07 ENCOUNTER — Ambulatory Visit (INDEPENDENT_AMBULATORY_CARE_PROVIDER_SITE_OTHER): Payer: Medicare Other | Admitting: Adult Health

## 2023-07-07 VITALS — BP 98/60 | HR 73 | Temp 98.4°F | Ht 62.0 in | Wt 227.0 lb

## 2023-07-07 DIAGNOSIS — M2550 Pain in unspecified joint: Secondary | ICD-10-CM | POA: Diagnosis not present

## 2023-07-07 DIAGNOSIS — R300 Dysuria: Secondary | ICD-10-CM

## 2023-07-07 DIAGNOSIS — M353 Polymyalgia rheumatica: Secondary | ICD-10-CM | POA: Diagnosis not present

## 2023-07-07 DIAGNOSIS — R52 Pain, unspecified: Secondary | ICD-10-CM | POA: Diagnosis not present

## 2023-07-07 LAB — URINALYSIS, ROUTINE W REFLEX MICROSCOPIC
Bilirubin Urine: NEGATIVE
Hgb urine dipstick: NEGATIVE
Ketones, ur: NEGATIVE
Nitrite: NEGATIVE
Specific Gravity, Urine: <=1.005 — AB (ref 1.000–1.030)
Total Protein, Urine: NEGATIVE
Urine Glucose: 100 — AB
Urobilinogen, UA: 0.2 (ref 0.0–1.0)
pH: 6 (ref 5.0–8.0)

## 2023-07-07 LAB — SEDIMENTATION RATE: Sed Rate: 127 mm/hr — ABNORMAL HIGH (ref 0–30)

## 2023-07-07 LAB — C-REACTIVE PROTEIN: CRP: 2.3 mg/dL (ref 0.5–20.0)

## 2023-07-07 MED ORDER — DULOXETINE HCL 60 MG PO CPEP
60.0000 mg | ORAL_CAPSULE | Freq: Every day | ORAL | 0 refills | Status: DC
Start: 2023-07-07 — End: 2023-08-23

## 2023-07-07 NOTE — Progress Notes (Signed)
Subjective:    Patient ID: Regina [name], female    DOB: 04/05/1941, 82 y.o.   MRN: [id number]  HPI  82 year old female who  has a past medical history of Back pain, CHF (congestive heart failure) (HCC), Chronic combined systolic and diastolic CHF (congestive heart failure) (HCC), Diabetes mellitus without complication (HCC), GERD (gastroesophageal reflux disease), Heart disease, Hypertension, Hypothyroidism, Joint pain, Mini stroke, Non-obstructive CAD, Sleep apnea, and Swallowing difficulty.  She presents with her daughter today   She is a patient of Dr. Betty [place] whom I am seeing for the first time.   She went to the ER yesterday via EMS for reported extremity pain that had been present for a few weeks but was worse when she woke up yesterday morning.  She reported that "it felt like a vein that someone was pulling out and was very sharp".  She was also having pain to the left side of her head.  CBC and BMP were at baseline, creatinine 2.4 with a GFR of 20.  Her urinalysis showed trace leukocytes but negative for nitrates.  Unfortunately she left before she could be seen  Today in the office she reports that she has bilateral shoulder pain that radiates down her arms and bilateral lower extremity pain that radiates down from her hips to her lower legs.  This pain is described as sharp.  Pain has been constant except for when she is laying down and at that point she does not have any pain.  Has been using Tylenol and her prescribed gabapentin without any improvement.  She is concerned because her urinalysis showed leukocytes.  He has questionable dysuria but no frequency, urgency and has low back pain but feels as though this is pain that she has been experiencing elsewhere throughout her body.  He was seen by neurology in the past, last being in 2022 and was diagnosed with fibromyalgia rheumatica.  Looks like she has been prescribed prednisone and Cymbalta in the past.  She feels  as though this prednisone makes her feel ill so she does not want to take any more of this medication.  She does not remember any side effects with Cymbalta and it looks like this medication may have run out of her refills and was never refilled.  Does feel stiff in the morning when she wakes up  Review of Systems See HPI     Objective:   Physical Exam Vitals and nursing note reviewed.  Constitutional:      Appearance: Normal appearance.  Cardiovascular:     Rate and Rhythm: Normal rate and regular rhythm.     Pulses: Normal pulses.     Heart sounds: Normal heart sounds.  Pulmonary:     Effort: Pulmonary effort is normal.     Breath sounds: Normal breath sounds.  Musculoskeletal:        General: Tenderness (He has tenderness throughout multiple parts of her body including bilateral shoulders, upper back, upper arms, low back, and throughout her entire legs.) present. Normal range of motion.  Skin:    General: Skin is warm and dry.     Findings: No erythema or rash.  Neurological:     General: No focal deficit present.     Mental Status: She is alert and oriented to person, place, and time.  Psychiatric:        Mood and Affect: Mood normal.        Behavior: Behavior normal.  Thought Content: Thought content normal.        Judgment: Judgment normal.        Assessment & Plan:  1. Multiple joint pain -Thinking this is related to her polymyalgia rheumatica.  She refuses prednisone.  Will start back on her Cymbalta dose that she was on in 2022. - Follow up with PCP within in the next 2 weeks  - DULoxetine (CYMBALTA) 60 MG capsule; Take 1 capsule (60 mg total) by mouth daily.  Dispense: 30 capsule; Refill: 0 - C-reactive Protein; Future - Sedimentation Rate; Future - Sedimentation Rate - C-reactive Protein  2. Dysuria - Will treat as needed - Urinalysis; Future - Urine Culture; Future - Urine Culture - Urinalysis  3. Polymyalgia rheumatica (HCC)  - DULoxetine  (CYMBALTA) 60 MG capsule; Take 1 capsule (60 mg total) by mouth daily.  Dispense: 30 capsule; Refill: 0 - C-reactive Protein; Future - Sedimentation Rate; Future - Sedimentation Rate - C-reactive Protein   Shirline Frees, NP  Time spent with patient today was 35 minutes which consisted of chart review, discussing multiple joint pain, labs from ER visit, and urinary tract infections, , work up, treatment answering questions and documentation.

## 2023-07-07 NOTE — Patient Instructions (Signed)
I am going to check some labs on you today. We will follow up with you regarding your blood work and urinalysis  I am going to send in a 30 days supply of Cymbalta   Please follow up with Dr. [place] in the next two weeks

## 2023-07-08 ENCOUNTER — Encounter (HOSPITAL_COMMUNITY): Payer: Self-pay

## 2023-07-08 LAB — URINE CULTURE
MICRO NUMBER:: 15495429
Result:: NO GROWTH
SPECIMEN QUALITY:: ADEQUATE

## 2023-07-12 ENCOUNTER — Ambulatory Visit: Payer: Medicare Other | Admitting: Family Medicine

## 2023-07-12 ENCOUNTER — Other Ambulatory Visit (INDEPENDENT_AMBULATORY_CARE_PROVIDER_SITE_OTHER): Payer: Medicare Other

## 2023-07-12 ENCOUNTER — Encounter: Payer: Self-pay | Admitting: Family Medicine

## 2023-07-12 VITALS — BP 120/70 | HR 88 | Resp 16 | Ht 62.0 in | Wt 226.5 lb

## 2023-07-12 DIAGNOSIS — E1169 Type 2 diabetes mellitus with other specified complication: Secondary | ICD-10-CM

## 2023-07-12 DIAGNOSIS — E039 Hypothyroidism, unspecified: Secondary | ICD-10-CM | POA: Diagnosis not present

## 2023-07-12 DIAGNOSIS — N184 Chronic kidney disease, stage 4 (severe): Secondary | ICD-10-CM

## 2023-07-12 DIAGNOSIS — M791 Myalgia, unspecified site: Secondary | ICD-10-CM

## 2023-07-12 DIAGNOSIS — I25118 Atherosclerotic heart disease of native coronary artery with other forms of angina pectoris: Secondary | ICD-10-CM | POA: Diagnosis not present

## 2023-07-12 DIAGNOSIS — Z23 Encounter for immunization: Secondary | ICD-10-CM

## 2023-07-12 DIAGNOSIS — R200 Anesthesia of skin: Secondary | ICD-10-CM

## 2023-07-12 DIAGNOSIS — E785 Hyperlipidemia, unspecified: Secondary | ICD-10-CM | POA: Diagnosis not present

## 2023-07-12 DIAGNOSIS — Z7984 Long term (current) use of oral hypoglycemic drugs: Secondary | ICD-10-CM

## 2023-07-12 LAB — LIPID PANEL
Cholesterol: 108 mg/dL (ref 0–200)
HDL: 49.3 mg/dL (ref >39.00)
LDL Cholesterol: 48 mg/dL (ref 0–99)
NonHDL: 58.44
Total CHOL/HDL Ratio: 2
Triglycerides: 54 mg/dL (ref 0.0–149.0)
VLDL: 10.8 mg/dL (ref 0.0–40.0)

## 2023-07-12 LAB — TSH: TSH: 0.64 u[IU]/mL (ref 0.35–5.50)

## 2023-07-12 LAB — T4, FREE: Free T4: 1.03 ng/dL (ref 0.60–1.60)

## 2023-07-12 LAB — VITAMIN B12: Vitamin B-12: 769 pg/mL (ref 211–911)

## 2023-07-12 NOTE — Progress Notes (Unsigned)
HPI: [name] is a 82 y.o. female with a PMHx significant for HTN, HLD, DM II, hypothyroidism, CKD IV, CAD, HFrEF, cardiac amyloidosis, PAF, GERD, peripheral neuropathy, and OA, who is here today with her daughter for chronic disease management.  Last seen on 02/10/2023 She has since been seen here by Shirline Frees NP for generalized joint pain. She has also seen endocrinology and cardiology.   She complains of pain in both arms and right leg. She describes it as a sharp pain like "something sizzling is running through her body."  She endorses numbness in her middle, ring, and pinky fingers of both hands.  Problem has been going on for sometime but suddenly worse on 07/05/23. She went to the ED, had blood work done but after a few hours of waiting she left. Lab Results  Component Value Date   ESRSEDRATE 127 Repeated and verified X2. (H) 07/07/2023   Lab Results  Component Value Date   CRP 2.3 07/07/2023   She says it is worse in the mornings with stiffness. She mentions that her pain has improved since last week.  She is taking duloxetine 60 mg once daily, and has had no problems with it.   She denies any skin lesions, rash, or numbness on areas affected by pain.  Lab Results  Component Value Date   VITAMINB12 810 06/18/2021   Lab Results  Component Value Date   WBC 5.3 07/05/2023   HGB 12.8 07/05/2023   HCT 38.7 07/05/2023   MCV 93.0 07/05/2023   PLT 258 07/05/2023   CKD IV: Follows with nephrologist every 6 months, last seen in 05/2023.  Lab Results  Component Value Date   NA 135 07/05/2023   CL 97 (L) 07/05/2023   K 3.9 07/05/2023   CO2 27 07/05/2023   BUN 61 (H) 07/05/2023   CREATININE 2.40 (H) 07/05/2023   GFRNONAA 20 (L) 07/05/2023   CALCIUM 9.5 07/05/2023   ALBUMIN 3.9 05/17/2022   GLUCOSE 145 (H) 07/05/2023   Hypothyroidism: She is currently taking levothyroxine 150 mcg daily.  Lab Results  Component Value Date   TSH 0.65 06/09/2021   She  states she still has occasional headaches.   Cardiac amyloidosis: She is still taking the Eplontersen and Tafamidis.  HLD on Atorvastatin 20 mg daily. Lab Results  Component Value Date   CHOL 115 08/13/2021   HDL 54.80 08/13/2021   LDLCALC 40 08/13/2021   TRIG 101.0 08/13/2021   CHOLHDL 2 08/13/2021   Review of Systems  Constitutional:  Negative for chills and fever.  HENT:  Negative for mouth sores, nosebleeds and sore throat.   Cardiovascular:  Negative for chest pain and palpitations.  Gastrointestinal:  Negative for abdominal pain, nausea and vomiting.  Endocrine: Negative for cold intolerance and heat intolerance.  Genitourinary:  Negative for decreased urine volume, dysuria and hematuria.  Neurological:  Negative for syncope and facial asymmetry.  Psychiatric/Behavioral:  Negative for confusion. The patient is nervous/anxious.   See other pertinent positives and negatives in HPI.  Current Outpatient Medications on File Prior to Visit  Medication Sig Dispense Refill   acetaminophen (TYLENOL) 500 MG tablet Take 1,000 mg by mouth every 6 (six) hours as needed for mild pain.     Alcohol Swabs (B-D SINGLE USE SWABS REGULAR) PADS Use to test blood sugar up to 3 times daily 100 each 3   allopurinol (ZYLOPRIM) 100 MG tablet Take 1 tablet (100 mg total) by mouth every morning. 90 tablet 1  atorvastatin (LIPITOR) 20 MG tablet TAKE ONE TABLET BY MOUTH EVERYDAY AT BEDTIME 90 tablet 3   Blood Glucose Monitoring Suppl (ACCU-CHEK GUIDE) w/Device KIT 1 Device by Does not apply route 3 (three) times daily. 1 kit 0   cetirizine (ZYRTEC) 10 MG chewable tablet Chew 10 mg by mouth daily.     Continuous Blood Gluc Receiver (FREESTYLE LIBRE 2 READER) DEVI Use as instructed to check blood sugar. 1 each 0   Continuous Blood Gluc Sensor (FREESTYLE LIBRE 2 SENSOR) MISC Change  sensors every 14 days 6 each 3   DULoxetine (CYMBALTA) 60 MG capsule Take 1 capsule (60 mg total) by mouth daily. 30 capsule 0    Eplontersen Sodium (WAINUA) 45 MG/0.8ML SOAJ Inject 45 mg into the skin every 30 (thirty) days. 0.8 mL 12   FARXIGA 10 MG TABS tablet Take 1 tablet (10 mg total) by mouth daily. 90 tablet 3   Fluocinolone Acetonide 0.01 % OIL instill one drop in ears every 2-3 DAYS AS NEEDED FOR pruritis 20 mL 1   gabapentin (NEURONTIN) 600 MG tablet TAKE ONE TABLET BY MOUTH AT BREAKFAST AND AT BEDTIME 60 tablet 3   glucose blood (ACCU-CHEK GUIDE) test strip Use to test blood sugar up to 3 times daily 100 each 3   insulin glargine, 1 Unit Dial, (TOUJEO SOLOSTAR) 300 UNIT/ML Solostar Pen Inject 22 Units into the skin daily. Eat a snack with protein nightly before bedtime. 15 mL 6   insulin lispro (HUMALOG KWIKPEN) 100 UNIT/ML KwikPen Inject 4-8 Units into the skin daily as needed. Sliding scale: Max daily 30 units 30 mL 1   Insulin Pen Needle 32G X 4 MM MISC 1 Device by Does not apply route in the morning, at noon, in the evening, and at bedtime. 400 each 3   isosorbide mononitrate (IMDUR) 30 MG 24 hr tablet Take 1 tablet (30 mg total) by mouth daily. 30 tablet 7   ketoconazole (NIZORAL) 2 % shampoo Apply 1 application. topically 2 (two) times a week. (Patient taking differently: Apply 1 application  topically daily as needed for irritation.) 120 mL 2   Lancets Misc. (ACCU-CHEK FASTCLIX LANCET) KIT Use to test blood sugar up to 3 times daily 1 kit 1   levothyroxine (SYNTHROID) 150 MCG tablet TAKE ONE TABLET BY MOUTH BEFORE BREAKFAST 90 tablet 3   magnesium oxide (MAG-OX) 400 MG tablet TAKE ONE TABLET BY MOUTH EVERY MORNING 90 tablet 1   metoprolol succinate (TOPROL-XL) 25 MG 24 hr tablet TAKE ONE TABLET BY MOUTH ONCE DAILY with OR immedaitely following A meal 90 tablet 3   Multiple Vitamin (MULTIVITAMIN) tablet Take 1 tablet by mouth daily. K FREE DAILY once daily (MVI with no vitamin K)     Multiple Vitamins-Minerals (ONE-A-DAY WOMENS PO) Take 1 tablet by mouth daily.     nitroGLYCERIN (NITROSTAT) 0.4 MG SL tablet  Dissolve 1 tab under tongue as needed for chest pain. May repeat every 5 minutes x 2 doses. If no relief call 9-1-1. 75 tablet 2   pantoprazole (PROTONIX) 20 MG tablet Take 1 tablet (20 mg total) by mouth daily. 30 tablet 2   Polyethylene Glycol 3350 (MIRALAX PO) Take 1 Package by mouth daily as needed (constipation).     potassium chloride SA (KLOR-CON M) 20 MEQ tablet Take 2 tablets (40 mEq total) by mouth daily. 60 tablet 6   Propylene Glycol (SYSTANE BALANCE OP) Place 1 drop into both eyes daily as needed (for dry eyes).  sacubitril-valsartan (ENTRESTO) 24-26 MG Take 1 tablet by mouth 2 (two) times daily. 90 tablet 3   Tafamidis 61 MG CAPS Take 1 capsule (61 mg total) by mouth daily. 30 capsule 12   tirzepatide (MOUNJARO) 5 MG/0.5ML Pen Inject 5 mg into the skin once a week. 6 mL 3   torsemide (DEMADEX) 20 MG tablet Take 4 tablets (80 mg total) by mouth daily. May take an additional 20 mg as needed for swelling 120 tablet 6   trolamine salicylate (ASPERCREME) 10 % cream Apply 1 application. topically as needed for muscle pain.     warfarin (COUMADIN) 5 MG tablet TAKE 1/2 TABLET BY MOUTH DAILY OR AS DIRECTED BY COUMADIN CLINIC 30 tablet 3   No current facility-administered medications on file prior to visit.    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 01/2015 Cardiolite: + inf wall ischemia, EF 56%;  b. 01/2015 Cath: LM nl, LAD 33m, LCX min irregs, RCA dominant, 50-34m.   Sleep apnea    Swallowing difficulty    Allergies  Allergen Reactions   Ace Inhibitors Other (See Comments)    Unknown reaction    Amlodipine Other (See Comments)    Unknown reaction   Atenolol Other (See Comments)    bradycardia   Avandia [Rosiglitazone] Other (See Comments)    Unknown reaction   Darvon [Propoxyphene] Other (See Comments)    Hallucinations    Erythromycin Itching   Hydralazine Other (See Comments)    Burning in throat and chest   Hydrocodone Other (See Comments)    Hallucinations.   Levofloxacin Itching   Morphine And Codeine Other (See Comments)    Dizzy and hallucianation, vomiting; Willing to try low dose   Percocet [Oxycodone-Acetaminophen] Other (See Comments)    hallucination   Spironolactone Other (See Comments)    Unknown reaction   Tramadol Other (See Comments)    Unknown/does not recall reaction but does not want to take again    Social History   Socioeconomic History   Marital status: Widowed    Spouse name: Not on file   Number of children: Not on file   Years of education: Not on file   Highest education level: Associate degree: academic program  Occupational History   Occupation: Retired  Tobacco Use   Smoking status: Never   Smokeless tobacco: Never  Vaping Use   Vaping status: Never Used  Substance and Sexual Activity   Alcohol use: No   Drug use: Never   Sexual activity: Not Currently  Other Topics Concern   Not on file  Social History Narrative   Lives with daughter   Social Determinants of Health   Financial Resource Strain: Low Risk  (07/07/2023)   Overall Financial Resource Strain (CARDIA)    Difficulty of Paying Living Expenses: Not hard at all  Food Insecurity: Unknown (07/07/2023)   Hunger Vital Sign    Worried About Running Out of Food in the Last Year: Patient declined    Ran Out of Food in the Last Year: Never  true  Transportation Needs: No Transportation Needs (07/07/2023)   PRAPARE - Administrator, Civil Service (Medical): No    Lack of Transportation (Non-Medical): No  Physical Activity: Inactive (07/07/2023)   Exercise Vital  Sign    Days of Exercise per Week: 0 days    Minutes of Exercise per Session: 0 min  Stress: No Stress Concern Present (07/07/2023)   [employer] of Occupational Health - Occupational Stress Questionnaire    Feeling of Stress : Only a little  Social Connections: Moderately Integrated (07/07/2023)   Social Connection and Isolation Panel [NHANES]    Frequency of Communication with Friends and Family: More than three times a week    Frequency of Social Gatherings with Friends and Family: Once a week    Attends Religious Services: More than 4 times per year    Active Member of [employer] or Organizations: Yes    Attends Banker Meetings: More than 4 times per year    Marital Status: Widowed    Vitals:   07/12/23 1427  BP: 120/70  Pulse: 88  Resp: 16  SpO2: 95%   Body mass index is 41.43 kg/m.  Physical Exam Vitals and nursing note reviewed.  Constitutional:      General: She is not in acute distress.    Appearance: She is well-developed.  HENT:     Head: Normocephalic and atraumatic.     Mouth/Throat:     Mouth: Mucous membranes are moist.     Pharynx: Oropharynx is clear.  Eyes:     Conjunctiva/sclera: Conjunctivae normal.  Cardiovascular:     Rate and Rhythm: Normal rate. Rhythm irregular.     Pulses:          Dorsalis pedis pulses are 2+ on the right side and 2+ on the left side.     Heart sounds: No murmur heard.    Comments: Trace pitting LE edema, bilateral. Pulmonary:     Effort: Pulmonary effort is normal. No respiratory distress.     Breath sounds: Normal breath sounds.  Abdominal:     Palpations: Abdomen is soft. There is no hepatomegaly or mass.     Tenderness: There is no abdominal tenderness.  Musculoskeletal:     Right shoulder: Normal range of motion.     Left shoulder: Normal range of motion.     Cervical back: Muscular tenderness present.     Comments: Pain is not elicited when applying pressure on back and extremities.She tells me I am  not pressing hard enough, she does and reports tenderness. No signs of synovitis.  Lymphadenopathy:     Cervical: No cervical adenopathy.  Skin:    General: Skin is warm.     Findings: No erythema or rash.  Neurological:     General: No focal deficit present.     Mental Status: She is alert and oriented to person, place, and time.     Cranial Nerves: No cranial nerve deficit.     Gait: Gait normal.  Psychiatric:        Mood and Affect: Affect normal. Mood is anxious.   ASSESSMENT AND PLAN:  Ms. Wortmann was seen today for chronic disease follow up.   Orders Placed This Encounter  Procedures   TSH   T4, free   Lab Results  Component Value Date   TSH 0.64 07/12/2023   Lab Results  Component Value Date   VITAMINB12 769 07/12/2023   Lab Results  Component Value Date  CHOL 108 07/12/2023   HDL 49.30 07/12/2023   LDLCALC 48 07/12/2023   TRIG 54.0 07/12/2023   CHOLHDL 2 07/12/2023   Myalgia Upper extremities and RLE. We discussed possible etiologies. ? Fibromyalgia, radiculopathy,amyloidosis. She started Duloxetine 2-3 days ago, e GFR 25 but for now no changes in dose. Recommend to let me know in about 4-6 weeks if medication is helping, if not we can wean it off. Hx of PMR. CRP wnl and sed rate was elevated at 127. She is not interested in trying course of oral Prednisone, took it in the past and it was not well tolerated. Stretching exercises my help.  Numbness in both hands No focal deficit. We discussed possible causes, some of her chronic co morbilities can be contributing factors. Monitor for new symptoms. Continue Duloxetine 60 mg for now.  -     Vitamin B12; Future  Coronary artery disease involving native coronary artery of native heart with other form of angina pectoris [facility]) Assessment & Plan: 01/2015 Cardiolite: + inf wall ischemia, EF 56%; b. 01/2015 Cath: LM nl, LAD 80m, LCX min irregs, RCA dominant, 50-14m.  On Atorvastatin and  Imdur.  Hypothyroidism, unspecified type Assessment & Plan: Continue Levothyroxine 150 mcg daily. TSH last done in 05/2021, 0.6. Will adjust dose if needed, according to TSH result.  Orders: -     TSH; Future -     T4, free; Future  Need for influenza vaccination -     Flu Vaccine Trivalent High Dose (Fluad)  Hyperlipidemia associated with type 2 diabetes mellitus (HCC) Assessment & Plan: Last LDL 40 in 07/2021. Continue Atorvastatin 20 mg daily. Low fat diet. FLP ordered today, ate last 6-7 hours ago.  Orders: -     Lipid panel; Future  CKD (chronic kidney disease) stage 4, GFR 15-29 ml/min (HCC) Assessment & Plan: Stable. Cr 2.4 and e GFR 25. Estimated Creatinine Clearance: 21.3 mL/min (A) (by C-G formula based on SCr of 2.4 mg/dL (H)). Continue low salt diet, adequate hydration,and avoidance of NSAID's. On Farxiga 10 mg daily and Valsartan (with Entresto). Following with nephrologist, last visit in 05/2023.  I spent a total of 43 minutes in both face to face and non face to face activities for this visit on the date of this encounter. During this time history was obtained and documented, examination was performed, prior labs/imaging reviewed, and assessment/plan discussed.  Return in about 6 months (around 01/09/2024).  I, Suanne Marker, acting as a scribe for Elizzie Westergard [place], MD., have documented all relevant documentation on the behalf of Donyea Beverlin [place], MD, as directed by  Eriq Hufford [place], MD while in the presence of Lacrecia Delval [place], MD.   I, Suanne Marker, have reviewed all documentation for this visit. The documentation on 07/12/23 for the exam, diagnosis, procedures, and orders are all accurate and complete.  Maynor Mwangi G. [place], MD  [facility]. [place] office.

## 2023-07-12 NOTE — Patient Instructions (Addendum)
A few things to remember from today's visit:  Hypothyroidism, unspecified type - Plan: TSH, T4, free  Atherosclerosis of aorta (HCC), Chronic  Coronary artery disease involving native coronary artery of native heart with other form of angina pectoris (HCC), Chronic  No changes today. Continue Duloxetine for at least 6-8 weeks to determine if it is helping with joint pain. Tai chi exercises may help.  If you need refills for medications you take chronically, please call your pharmacy. Do not use My Chart to request refills or for acute issues that need immediate attention. If you send a my chart message, it may take a few days to be addressed, specially if I am not in the office.  Please be sure medication list is accurate. If a new problem present, please set up appointment sooner than planned today.

## 2023-07-12 NOTE — Assessment & Plan Note (Signed)
Stable. Cr 2.4 and e GFR 25. Estimated Creatinine Clearance: 21.3 mL/min (A) (by C-G formula based on SCr of 2.4 mg/dL (H)). Continue low salt diet, adequate hydration,and avoidance of NSAID's. On Farxiga 10 mg daily and Valsartan (with Entresto). Following with nephrologist, last visit in 05/2023.

## 2023-07-13 NOTE — Assessment & Plan Note (Signed)
01/2015 Cardiolite: + inf wall ischemia, EF 56%; b. 01/2015 Cath: LM nl, LAD 39m, LCX min irregs, RCA dominant, 50-67m.  On Atorvastatin and Imdur.

## 2023-07-13 NOTE — Assessment & Plan Note (Signed)
Last LDL 40 in 07/2021. Continue Atorvastatin 20 mg daily. Low fat diet. FLP ordered today, ate last 6-7 hours ago.

## 2023-07-13 NOTE — Assessment & Plan Note (Signed)
Continue Levothyroxine 150 mcg daily. TSH last done in 05/2021, 0.6. Will adjust dose if needed, according to TSH result.

## 2023-07-19 ENCOUNTER — Other Ambulatory Visit: Payer: Self-pay

## 2023-07-19 NOTE — Progress Notes (Signed)
Specialty Pharmacy Refill Coordination Note  Regina [name] is a 82 y.o. female contacted today regarding refills of specialty medication(s) Tafamidis   Patient requested Delivery   Delivery date: 07/27/23   Verified address: [address] [place], [address]   Medication will be filled on 07/26/2023.

## 2023-07-25 ENCOUNTER — Other Ambulatory Visit: Payer: Self-pay

## 2023-07-25 DIAGNOSIS — E1140 Type 2 diabetes mellitus with diabetic neuropathy, unspecified: Secondary | ICD-10-CM

## 2023-07-25 MED ORDER — ACCU-CHEK GUIDE VI STRP: ORAL_STRIP | 3 refills | Status: AC

## 2023-07-25 MED ORDER — ACCU-CHEK FASTCLIX LANCET KIT: PACK | 1 refills | Status: AC

## 2023-07-25 MED ORDER — ACCU-CHEK GUIDE W/DEVICE KIT: PACK | 0 refills | Status: AC

## 2023-08-02 NOTE — Progress Notes (Unsigned)
Cardiology Office Note:  .   Date:  08/03/2023  ID:  MURTIS TEN, DOB July 06, 1941, MRN [id number] PCP: Regina [name], Regina G, MD  [facility] Providers Cardiologist:  Reatha Harps, MD  History of Present Illness: .   Regina [name] is a 82 y.o. female with below medical history who presents for follow-up.   Discussed the use of AI scribe software for clinical note transcription with the patient, who gave verbal consent to proceed.  History of Present Illness   Regina [name], a 82 year old with a history of cardiac amyloidosis, systolic heart failure, persistent atrial fibrillation, diabetes, CKD stage four, hypertension, and OSA, presents for a routine follow-up. The patient reports feeling well and has been adhering to the prescribed medication regimen, which includes Tafamidis, eplontersen, and Torsemide. The patient has been monitoring fluid intake and weight, which has been stable. The patient also reports a recent trip to [place], which was well-tolerated without any exacerbation of symptoms.  The patient has been on eplontersen for over a year and reports receiving monthly calls from a nurse to check on her condition. The patient also mentions a recent issue with arm discomfort, which was evaluated by another doctor. The patient was started on Cymbalta for this issue, which has reportedly resolved the discomfort. The patient also mentions a change in Coumadin medication due to a change in pharmacy.   She denies CP or SOB. No LE edema. Weights are stable ~220lbs.          Problem List 1.  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 Persistent Afib -LAA slugde vs thrombus 05/05/2020  -persistent sludge 07/01/2020 -LAA thrombus persistent 08/05/2020 -on warfarin 3. DM -A1c 7.0 4. HLD -T chol 108, HDL 49, LDL 48, TG 54 5. HTN 6. Non-obstructive CAD -LHC  01/19/2015 Regina [name] -50% mid RCA and 50% LAD -MPI 08/14/2020 -> normal 7. OSA 8. CKD 3/4 9. CVA -11/2020 @ [facility]  10. Polymyalgia Rheumatica     ROS: All other ROS reviewed and negative. Pertinent positives noted in the HPI.     Studies Reviewed: Marland Kitchen       Physical Exam:   VS:  BP 128/74 (BP Location: Left Arm, Patient Position: Sitting, Cuff Size: Large)   Pulse 94   Ht 5\' 2"  (1.575 m)   Wt 224 lb (101.6 kg)   SpO2 98%   BMI 40.97 kg/m    Wt Readings from Last 3 Encounters:  08/03/23 224 lb (101.6 kg)  07/12/23 226 lb 8 oz (102.7 kg)  07/07/23 227 lb (103 kg)    GEN: Well nourished, well developed in no acute distress NECK: No JVD; No carotid bruits CARDIAC: irregular rhythm, no murmurs, rubs, gallops RESPIRATORY:  Clear to auscultation without rales, wheezing or rhonchi  ABDOMEN: Soft, non-tender, non-distended EXTREMITIES:  No edema; No deformity  ASSESSMENT AND PLAN: .   Assessment and Plan    Hereditary Cardiac Amyloidosis -Stable on Tafamidis and eplontersen.  Systolic Heart Failure EF 40%. Stable on Torsemide 80mg  daily, Entresto 24-26mg  BID, and Metoprolol succinate 25mg  daily. -Continue Torsemide 80mg  daily, Entresto 24-26mg  BID, and Metoprolol succinate 25mg  daily. -continue farxiga.  -No MRA due to CKD IV -stable on exam today   CKD Stage 4 Stable. -Continue current management.  Nonobstructive CAD LDL cholesterol at goal (48) on Lipitor. -Continue Lipitor. -no ASA since on warfarin  Persistent Atrial Fibrillation Rate controlled on  Metoprolol. On Coumadin due to failure of Eliquis and history of left atrial appendage thrombus. -Continue Metoprolol and Coumadin.  Follow up in 6 months.              Follow-up: Return in about 6 months (around 02/01/2024).  Time Spent with Patient: I have spent a total of 35 minutes with patient reviewing hospital notes, telemetry, EKGs, labs and examining the patient as well as establishing an assessment and plan  that was discussed with the patient.  > 50% of time was spent in direct patient care.  Signed, Regina [name]. Regina Lipps, MD, [facility]  [facility]  [address], [address] [place], [place] [address] [phone]  8:15 PM

## 2023-08-03 ENCOUNTER — Ambulatory Visit: Payer: Medicare Other | Admitting: *Deleted

## 2023-08-03 ENCOUNTER — Encounter: Payer: Self-pay | Admitting: Cardiovascular Disease

## 2023-08-03 ENCOUNTER — Ambulatory Visit: Payer: Medicare Other | Attending: Cardiovascular Disease | Admitting: Cardiovascular Disease

## 2023-08-03 VITALS — BP 128/74 | HR 94 | Ht 62.0 in | Wt 224.0 lb

## 2023-08-03 DIAGNOSIS — I4819 Other persistent atrial fibrillation: Secondary | ICD-10-CM | POA: Diagnosis not present

## 2023-08-03 DIAGNOSIS — I4821 Permanent atrial fibrillation: Secondary | ICD-10-CM

## 2023-08-03 DIAGNOSIS — I5022 Chronic systolic (congestive) heart failure: Secondary | ICD-10-CM

## 2023-08-03 DIAGNOSIS — N184 Chronic kidney disease, stage 4 (severe): Secondary | ICD-10-CM | POA: Diagnosis not present

## 2023-08-03 DIAGNOSIS — I43 Cardiomyopathy in diseases classified elsewhere: Secondary | ICD-10-CM | POA: Diagnosis not present

## 2023-08-03 DIAGNOSIS — I2510 Atherosclerotic heart disease of native coronary artery without angina pectoris: Secondary | ICD-10-CM

## 2023-08-03 DIAGNOSIS — Z5181 Encounter for therapeutic drug level monitoring: Secondary | ICD-10-CM

## 2023-08-03 DIAGNOSIS — Z7901 Long term (current) use of anticoagulants: Secondary | ICD-10-CM | POA: Diagnosis not present

## 2023-08-03 DIAGNOSIS — E854 Organ-limited amyloidosis: Secondary | ICD-10-CM | POA: Diagnosis not present

## 2023-08-03 DIAGNOSIS — D6869 Other thrombophilia: Secondary | ICD-10-CM | POA: Diagnosis not present

## 2023-08-03 LAB — POCT INR: POC INR: 5.6

## 2023-08-03 MED ORDER — WARFARIN SODIUM 2.5 MG PO TABS: ORAL_TABLET | ORAL | 1 refills | Status: DC

## 2023-08-03 NOTE — Patient Instructions (Signed)
Medication Instructions:  No changes   *If you need a refill on your cardiac medications before your next appointment, please call your pharmacy*   Lab Work:  None   If you have labs (blood work) drawn today and your tests are completely normal, you will receive your results only by: MyChart Message (if you have MyChart) OR A paper copy in the mail If you have any lab test that is abnormal or we need to change your treatment, we will call you to review the results.   Testing/Procedures:  None   Follow-Up: At [facility], you and your health needs are our priority.  As part of our continuing mission to provide you with exceptional heart care, we have created designated Provider Care Teams.  These Care Teams include your primary Cardiologist (physician) and Advanced Practice Providers (APPs -  Physician Assistants and Nurse Practitioners) who all work together to provide you with the care you need, when you need it.  We recommend signing up for the patient portal called "MyChart".  Sign up information is provided on this After Visit Summary.  MyChart is used to connect with patients for Virtual Visits (Telemedicine).  Patients are able to view lab/test results, encounter notes, upcoming appointments, etc.  Non-urgent messages can be sent to your provider as well.   To learn more about what you can do with MyChart, go to [email or web address].    Your next appointment:   6 month(s)  The format for your next appointment:   In Person  Provider:   Reatha Harps, MD

## 2023-08-03 NOTE — Patient Instructions (Signed)
Description   STOP TAKING WARFARIN 5mg  TABS. START USING WARFARIN 2.5mg  TABS.  HOLD WARFARIN 10/17, 10/18 and 10/19 Then START taking warfarin 1 tablet (2.5mg ) daily except for 1/2 a tablet ( 1.25mg ) on Thursdays. Recheck INR in 1 week.  Anticoagulation Clinic [phone]

## 2023-08-04 ENCOUNTER — Telehealth: Payer: Self-pay | Admitting: Pulmonary Disease

## 2023-08-04 DIAGNOSIS — G4733 Obstructive sleep apnea (adult) (pediatric): Secondary | ICD-10-CM

## 2023-08-04 NOTE — Telephone Encounter (Signed)
Patient states needs order for CPAP supplies. Also need office notes. Patient is using [facility]ME [employer]. [employer] fax number is [phone]. Patient phone number is [phone].

## 2023-08-10 ENCOUNTER — Ambulatory Visit: Payer: Medicare Other | Attending: Cardiovascular Disease | Admitting: *Deleted

## 2023-08-10 DIAGNOSIS — I4891 Unspecified atrial fibrillation: Secondary | ICD-10-CM | POA: Diagnosis not present

## 2023-08-10 DIAGNOSIS — Z7901 Long term (current) use of anticoagulants: Secondary | ICD-10-CM

## 2023-08-10 DIAGNOSIS — D6869 Other thrombophilia: Secondary | ICD-10-CM

## 2023-08-10 DIAGNOSIS — I4819 Other persistent atrial fibrillation: Secondary | ICD-10-CM | POA: Diagnosis not present

## 2023-08-10 LAB — POCT INR: INR: 1.2 — AB (ref 2.0–3.0)

## 2023-08-10 NOTE — Patient Instructions (Signed)
Description   (USE GREEN TABLET ONLY) Today take 1.5 tablets of warfarin and tomorrow take 1 tablet of warfarin then START taking warfarin 1 tablet (2.5mg ) daily except for 1/2 tablet ( 1.25mg ) on Thursdays. Recheck INR in 1 week. Anticoagulation Clinic [phone]

## 2023-08-10 NOTE — Telephone Encounter (Signed)
Order placed to [facility]  for cpap supplies. Patient is aware and voiced her understanding.  Nothing further needed.

## 2023-08-17 ENCOUNTER — Ambulatory Visit: Payer: Medicare Other | Attending: Cardiovascular Disease | Admitting: *Deleted

## 2023-08-17 DIAGNOSIS — I4891 Unspecified atrial fibrillation: Secondary | ICD-10-CM

## 2023-08-17 DIAGNOSIS — D6869 Other thrombophilia: Secondary | ICD-10-CM

## 2023-08-17 DIAGNOSIS — I4819 Other persistent atrial fibrillation: Secondary | ICD-10-CM | POA: Diagnosis not present

## 2023-08-17 DIAGNOSIS — Z7901 Long term (current) use of anticoagulants: Secondary | ICD-10-CM | POA: Diagnosis not present

## 2023-08-17 LAB — POCT INR: INR: 2.8 (ref 2.0–3.0)

## 2023-08-17 NOTE — Patient Instructions (Addendum)
Description   (USE GREEN TABLET ONLY) Continue taking warfarin 1 tablet (2.5mg ) daily except for 1/2 tablet (1.25mg ) on Thursdays. Recheck INR in 3 weeks. Anticoagulation Clinic [phone]

## 2023-08-18 ENCOUNTER — Other Ambulatory Visit: Payer: Self-pay | Admitting: Adult Health

## 2023-08-18 DIAGNOSIS — M353 Polymyalgia rheumatica: Secondary | ICD-10-CM

## 2023-08-18 DIAGNOSIS — M2550 Pain in unspecified joint: Secondary | ICD-10-CM

## 2023-08-18 NOTE — Telephone Encounter (Signed)
Message routed to PCP CMA  

## 2023-08-21 ENCOUNTER — Other Ambulatory Visit: Payer: Self-pay

## 2023-08-21 NOTE — Progress Notes (Signed)
Specialty Pharmacy Refill Coordination Note  Regina [name] is a 82 y.o. female contacted today regarding refills of specialty medication(s) Tafamidis   Patient requested Delivery   Delivery date: 08/28/23   Verified address: [address], [place], [address]   Medication will be filled on 11/8.

## 2023-08-28 ENCOUNTER — Encounter: Payer: Self-pay | Admitting: Family Medicine

## 2023-08-28 ENCOUNTER — Ambulatory Visit (INDEPENDENT_AMBULATORY_CARE_PROVIDER_SITE_OTHER): Payer: Medicare Other | Admitting: Family Medicine

## 2023-08-28 VITALS — BP 124/80 | HR 100 | Resp 16 | Ht 62.0 in | Wt 227.0 lb

## 2023-08-28 DIAGNOSIS — E8582 Wild-type transthyretin-related (ATTR) amyloidosis: Secondary | ICD-10-CM | POA: Diagnosis not present

## 2023-08-28 DIAGNOSIS — I700 Atherosclerosis of aorta: Secondary | ICD-10-CM | POA: Diagnosis not present

## 2023-08-28 DIAGNOSIS — Z7901 Long term (current) use of anticoagulants: Secondary | ICD-10-CM | POA: Diagnosis not present

## 2023-08-28 DIAGNOSIS — M159 Polyosteoarthritis, unspecified: Secondary | ICD-10-CM

## 2023-08-28 DIAGNOSIS — R519 Headache, unspecified: Secondary | ICD-10-CM | POA: Diagnosis not present

## 2023-08-28 DIAGNOSIS — G63 Polyneuropathy in diseases classified elsewhere: Secondary | ICD-10-CM

## 2023-08-28 MED ORDER — DULOXETINE HCL 30 MG PO CPEP: 30.0000 mg | ORAL_CAPSULE | Freq: Every day | ORAL | 1 refills | Status: DC

## 2023-08-28 NOTE — Patient Instructions (Addendum)
A few things to remember from today's visit:  Severe headache - Plan: MR Brain Wo Contrast  Long term (current) use of anticoagulants - Plan: MR Brain Wo Contrast  Wild-type transthyretin-related (ATTR) amyloidosis (HCC) - Plan: MR Brain Wo Contrast Monitor for new symptoms. Try decreasing Duloxetine from 60 mg to 30 mg and let me know if still helps with joint pain and electric like feeling in arms.  If you need refills for medications you take chronically, please call your pharmacy. Do not use My Chart to request refills or for acute issues that need immediate attention. If you send a my chart message, it may take a few days to be addressed, specially if I am not in the office.  Please be sure medication list is accurate. If a new problem present, please set up appointment sooner than planned today.

## 2023-08-28 NOTE — Assessment & Plan Note (Signed)
Seen on chest CT in 02/2020. She is on Atorvastatin 20 mg.

## 2023-08-28 NOTE — Assessment & Plan Note (Signed)
She is not sure if duloxetine 60 mg is helping, we discussed some side effects. She agrees with decreasing dose of duloxetine from 60 mg to 30 mg.  She will let me know if joint pain gets worse or if discomfort in upper extremities reoccurs.

## 2023-08-28 NOTE — Progress Notes (Signed)
ACUTE VISIT Chief Complaint  Patient presents with   Pain    Sharp pain in head on the left side and the back of head, ongoing for a while. Comes & goes, does use tylenol at night    HPI: Regina [name] is a 82 y.o. female with a PMHx significant for HTN, HLD, DM II, hypothyroidism, CKD IV, CAD, HFrEF, cardiac amyloidosis, PAF, GERD, peripheral neuropathy, and OA, who is here today with her daughter complaining of a sharp/shooting headache.  She complains of intermittent headaches for several months. She says they come on suddenly 2-3 times per week, it usually starts in the left temporal and moves around her head, band like pattern to right occipital area. Sometimes starts in right occipital area but not frequent, she states that it last minutes but her daughter thinks it pain lasts a few seconds. She describes the headaches as a sharp pain.   Headache  The current episode started more than 1 month ago. The problem occurs intermittently. The problem has been unchanged. The pain quality is not similar to prior headaches. The quality of the pain is described as shooting. The pain is at a severity of 10/10. The pain is severe. Associated symptoms include dizziness, a loss of balance and neck pain. Pertinent negatives include no abdominal pain, abnormal behavior, back pain, blurred vision, coughing, drainage, ear pain, eye pain, eye redness, facial sweating, fever, hearing loss, insomnia, muscle aches, nausea, phonophobia, photophobia, scalp tenderness, seizures, sinus pressure, sore throat, swollen glands, visual change, vomiting, weakness or weight loss. Nothing aggravates the symptoms. She has tried nothing for the symptoms. Her past medical history is significant for hypertension and obesity. There is no history of migraine headaches or recent head traumas.   She denies any associated visual changes,epiphora, nausea, vomiting, disorientation/MS changes, or focal weakness. She has not  identified exacerbating or alleviating factors.  She mentions these headaches started before she began taking Tafamidis 61 mg daily and Eplontersen Sodium for amyloidosis, 1-2 years ago.  She reports a history of TIA in 2007.  She had a head CT in 12/2021: No evidence of acute infarction, hemorrhage, hydrocephalus,extra-axial collection or mass lesion/mass effect. Chronic appearing lacunar infarct at the left thalamus. Cerebral volume loss which is generalized  Occasional neck pain, not radiated.  She has seen neuro  in 2022 for headaches, memory difficulties,and CVA. Elevated CRP and sed rate, Dx'ed with PMR and treated with systemic steroids.  Lab Results  Component Value Date   CRP 2.3 07/07/2023   Aortic atherosclerosis seen on chest CT in 02/2020. She is on Atorvastatin 20 mg daily. Lab Results  Component Value Date   CHOL 108 07/12/2023   HDL 49.30 07/12/2023   LDLCALC 48 07/12/2023   TRIG 54.0 07/12/2023   CHOLHDL 2 07/12/2023   Generalized OA,myalgias, and paresthesias: She is taking Duloxetine 60 mg not sure if medication is helping but states that she is no longer having "sizzling" sensation going through upper extremities. + Hands numbness sensation, stable. She has tolerated medication well. She is also on Gabapentin 600 mg bid.  CKD4: She follows with nephrology.  Lab Results  Component Value Date   NA 135 07/05/2023   CL 97 (L) 07/05/2023   K 3.9 07/05/2023   CO2 27 07/05/2023   BUN 61 (H) 07/05/2023   CREATININE 2.40 (H) 07/05/2023   GFRNONAA 20 (L) 07/05/2023   CALCIUM 9.5 07/05/2023   ALBUMIN 3.9 05/17/2022   GLUCOSE 145 (H) 07/05/2023  Review of Systems  Constitutional:  Negative for fever and weight loss.  HENT:  Negative for ear pain, hearing loss, sinus pressure and sore throat.   Eyes:  Negative for blurred vision, photophobia, pain and redness.  Respiratory:  Negative for cough.   Gastrointestinal:  Negative for abdominal pain, nausea and  vomiting.  Musculoskeletal:  Positive for neck pain. Negative for back pain.  Neurological:  Positive for dizziness, headaches and loss of balance. Negative for seizures and weakness.  Psychiatric/Behavioral:  The patient does not have insomnia.   See other pertinent positives and negatives in HPI.  Current Outpatient Medications on File Prior to Visit  Medication Sig Dispense Refill   acetaminophen (TYLENOL) 500 MG tablet Take 1,000 mg by mouth every 6 (six) hours as needed for mild pain.     Alcohol Swabs (B-D SINGLE USE SWABS REGULAR) PADS Use to test blood sugar up to 3 times daily 100 each 3   allopurinol (ZYLOPRIM) 100 MG tablet Take 1 tablet (100 mg total) by mouth every morning. 90 tablet 1   atorvastatin (LIPITOR) 20 MG tablet TAKE ONE TABLET BY MOUTH EVERYDAY AT BEDTIME 90 tablet 3   Blood Glucose Monitoring Suppl (ACCU-CHEK GUIDE) w/Device KIT Use to check blood sugars 1 kit 0   cetirizine (ZYRTEC) 10 MG chewable tablet Chew 10 mg by mouth daily.     Continuous Blood Gluc Receiver (FREESTYLE LIBRE 2 READER) DEVI Use as instructed to check blood sugar. 1 each 0   Continuous Blood Gluc Sensor (FREESTYLE LIBRE 2 SENSOR) MISC Change  sensors every 14 days 6 each 3   Eplontersen Sodium (WAINUA) 45 MG/0.8ML SOAJ Inject 45 mg into the skin every 30 (thirty) days. 0.8 mL 12   FARXIGA 10 MG TABS tablet Take 1 tablet (10 mg total) by mouth daily. 90 tablet 3   Fluocinolone Acetonide 0.01 % OIL instill one drop in ears every 2-3 DAYS AS NEEDED FOR pruritis 20 mL 1   gabapentin (NEURONTIN) 600 MG tablet TAKE ONE TABLET BY MOUTH AT BREAKFAST AND AT BEDTIME 60 tablet 3   glucose blood (ACCU-CHEK GUIDE) test strip Use to test blood sugar up to 2 times daily as needed 100 each 3   insulin glargine, 1 Unit Dial, (TOUJEO SOLOSTAR) 300 UNIT/ML Solostar Pen Inject 22 Units into the skin daily. Eat a snack with protein nightly before bedtime. 15 mL 6   insulin lispro (HUMALOG KWIKPEN) 100 UNIT/ML KwikPen  Inject 4-8 Units into the skin daily as needed. Sliding scale: Max daily 30 units 30 mL 1   Insulin Pen Needle 32G X 4 MM MISC 1 Device by Does not apply route in the morning, at noon, in the evening, and at bedtime. 400 each 3   isosorbide mononitrate (IMDUR) 30 MG 24 hr tablet Take 1 tablet (30 mg total) by mouth daily. 30 tablet 7   ketoconazole (NIZORAL) 2 % shampoo Apply 1 application. topically 2 (two) times a week. (Patient taking differently: Apply 1 application  topically daily as needed for irritation.) 120 mL 2   Lancets Misc. (ACCU-CHEK FASTCLIX LANCET) KIT Use to test blood sugar up to 2 times daily as needed 1 kit 1   levothyroxine (SYNTHROID) 150 MCG tablet TAKE ONE TABLET BY MOUTH BEFORE BREAKFAST 90 tablet 3   magnesium oxide (MAG-OX) 400 MG tablet TAKE ONE TABLET BY MOUTH EVERY MORNING 90 tablet 1   metoprolol succinate (TOPROL-XL) 25 MG 24 hr tablet TAKE ONE TABLET BY MOUTH ONCE DAILY with  OR immedaitely following A meal 90 tablet 3   Multiple Vitamin (MULTIVITAMIN) tablet Take 1 tablet by mouth daily. K FREE DAILY once daily (MVI with no vitamin K)     Multiple Vitamins-Minerals (ONE-A-DAY WOMENS PO) Take 1 tablet by mouth daily.     nitroGLYCERIN (NITROSTAT) 0.4 MG SL tablet Dissolve 1 tab under tongue as needed for chest pain. May repeat every 5 minutes x 2 doses. If no relief call 9-1-1. 75 tablet 2   pantoprazole (PROTONIX) 20 MG tablet Take 1 tablet (20 mg total) by mouth daily. 30 tablet 2   Polyethylene Glycol 3350 (MIRALAX PO) Take 1 Package by mouth daily as needed (constipation).     potassium chloride SA (KLOR-CON M) 20 MEQ tablet Take 2 tablets (40 mEq total) by mouth daily. 60 tablet 6   Propylene Glycol (SYSTANE BALANCE OP) Place 1 drop into both eyes daily as needed (for dry eyes).     sacubitril-valsartan (ENTRESTO) 24-26 MG Take 1 tablet by mouth 2 (two) times daily. 90 tablet 3   Tafamidis 61 MG CAPS Take 1 capsule (61 mg total) by mouth daily. 30 capsule 12    tirzepatide (MOUNJARO) 5 MG/0.5ML Pen Inject 5 mg into the skin once a week. 6 mL 3   torsemide (DEMADEX) 20 MG tablet Take 4 tablets (80 mg total) by mouth daily. May take an additional 20 mg as needed for swelling 120 tablet 6   trolamine salicylate (ASPERCREME) 10 % cream Apply 1 application. topically as needed for muscle pain.     warfarin (COUMADIN) 2.5 MG tablet Take 1/2 a tablet to 1 tablet by mouth daily as directed by the coumadin clinic 35 tablet 1   No current facility-administered medications on file prior to visit.    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 01/2015 Cardiolite: + inf wall ischemia, EF 56%;  b. 01/2015 Cath: LM nl, LAD 75m, LCX min irregs, RCA dominant, 50-35m.   Sleep apnea    Swallowing difficulty    Allergies  Allergen Reactions   Ace Inhibitors Other (See Comments)    Unknown reaction   Amlodipine Other (See Comments)    Unknown reaction   Atenolol Other (See Comments)    bradycardia   Avandia [Rosiglitazone] Other (See Comments)    Unknown reaction   Darvon [Propoxyphene] Other (See Comments)    Hallucinations    Erythromycin Itching   Hydralazine Other (See Comments)    Burning in throat and chest   Hydrocodone Other (See Comments)    Hallucinations.   Levofloxacin Itching   Morphine And Codeine Other (See Comments)    Dizzy and hallucianation, vomiting; Willing to try low dose   Percocet [Oxycodone-Acetaminophen] Other (See Comments)    hallucination   Spironolactone Other (See Comments)    Unknown reaction   Tramadol Other (See Comments)    Unknown/does not  recall reaction but does not want to take again    Social History   Socioeconomic History   Marital status: Widowed    Spouse  name: Not on file   Number of children: Not on file   Years of education: Not on file   Highest education level: Associate degree: academic program  Occupational History   Occupation: Retired  Tobacco Use   Smoking status: Never   Smokeless tobacco: Never  Vaping Use   Vaping status: Never Used  Substance and Sexual Activity   Alcohol use: No   Drug use: Never   Sexual activity: Not Currently  Other Topics Concern   Not on file  Social History Narrative   Lives with daughter   Social Determinants of Health   Financial Resource Strain: Low Risk  (07/07/2023)   Overall Financial Resource Strain (CARDIA)    Difficulty of Paying Living Expenses: Not hard at all  Food Insecurity: Unknown (07/07/2023)   Hunger Vital Sign    Worried About Running Out of Food in the Last Year: Patient declined    Ran Out of Food in the Last Year: Never true  Transportation Needs: No Transportation Needs (07/07/2023)   PRAPARE - Administrator, Civil Service (Medical): No    Lack of Transportation (Non-Medical): No  Physical Activity: Inactive (07/07/2023)   Exercise Vital Sign    Days of Exercise per Week: 0 days    Minutes of Exercise per Session: 0 min  Stress: No Stress Concern Present (07/07/2023)   [employer] of Occupational Health - Occupational Stress Questionnaire    Feeling of Stress : Only a little  Social Connections: Moderately Integrated (07/07/2023)   Social Connection and Isolation Panel [NHANES]    Frequency of Communication with Friends and Family: More than three times a week    Frequency of Social Gatherings with Friends and Family: Once a week    Attends Religious Services: More than 4 times per year    Active Member of [employer] or Organizations: Yes    Attends Banker Meetings: More than 4 times per year    Marital  Status: Widowed   Vitals:   08/28/23 1602  BP: 124/80  Pulse: 100  Resp: 16  SpO2: 96%   Body mass index is 41.52 kg/m.  Physical Exam Vitals and nursing note reviewed.  Constitutional:      General: She is not in acute distress.    Appearance: She is well-developed.  HENT:     Head: Normocephalic and atraumatic.     Comments: Negative for scalp tenderness.    Mouth/Throat:     Mouth: Mucous membranes are moist.  Eyes:     Conjunctiva/sclera: Conjunctivae normal.  Cardiovascular:     Rate and Rhythm: Normal rate and regular rhythm.     Heart sounds: No murmur heard. Pulmonary:     Effort: Pulmonary effort is normal. No respiratory distress.     Breath sounds: Normal breath sounds.  Abdominal:     Palpations: Abdomen is soft. There is no mass.     Tenderness: There is no abdominal tenderness.  Musculoskeletal:     Right shoulder: No tenderness. Normal range of motion.     Left shoulder: No tenderness. Normal range of motion.     Cervical back: No tenderness. No pain with movement or muscular tenderness. Normal range of motion.     Thoracic back: No tenderness.     Right lower leg: No edema.     Left lower leg: No edema.  Skin:    General: Skin is warm.     Findings: No erythema or rash.  Neurological:  General: No focal deficit present.     Mental Status: She is alert and oriented to person, place, and time.     Deep Tendon Reflexes:     Reflex Scores:      Patellar reflexes are 2+ on the right side and 2+ on the left side.    Comments: Unstable gait assisted with a cane.  Psychiatric:        Mood and Affect: Affect normal. Mood is anxious.   ASSESSMENT AND PLAN:  Regina [name] was seen today for headache.   Severe headache She has had this headache for some time now. We discussed possible etiologies. ? Neuralgiform headache attacks, tension headaches among some to consider. Due to the risk of med interaction and/or side effects, I am not recommending  pharmacologic treatment. Monitor for new symptoms. Brain MRI will be arranged. Clearly instructed about warning signs.  -     MR BRAIN WO CONTRAST; Future  Long term (current) use of anticoagulants -     MR BRAIN WO CONTRAST; Future  Wild-type transthyretin-related (ATTR) amyloidosis [facility]) Following with nephrologist and cardiologist.  -     MR BRAIN WO CONTRAST; Future  Osteoarthritis of multiple joints, unspecified osteoarthritis type Assessment & Plan: She is not sure if duloxetine 60 mg is helping, we discussed some side effects. She agrees with decreasing dose of duloxetine from 60 mg to 30 mg.  She will let me know if joint pain gets worse or if discomfort in upper extremities reoccurs, in which case we can increase dose back to 60 mg.  -     DULoxetine HCl; Take 1 capsule (30 mg total) by mouth daily.  Dispense: 30 capsule; Refill: 1  Polyneuropathy associated with underlying disease (HCC)  Amyloidosis and DM II. Continue Gabapentin 600 mg bid. Duloxetine dose decreased from 60 gm to 30 mg daily.  Atherosclerosis of aorta (HCC)  Seen on chest CT in 02/2020. She is on Atorvastatin 20 mg.  I spent a total of 41 minutes in both face to face and non face to face activities for this visit on the date of this encounter. During this time history was obtained and documented, examination was performed, prior labs/imaging reviewed, and assessment/plan discussed.  Return if symptoms worsen or fail to improve, for keep next appointment.  I, Rolla Etienne Wierda, acting as a scribe for Azel Gumina [place], MD., have documented all relevant documentation on the behalf of Emmamarie Kluender [place], MD, as directed by  Payam Gribble [place], MD while in the presence of Josanna Hefel [place], MD.   I, Dai Mcadams [place], MD, have reviewed all documentation for this visit. The documentation on 08/28/23 for the exam, diagnosis, procedures, and orders are all accurate and complete.  Melissa Tomaselli G. [place], MD  [facility] [place]  office.

## 2023-08-29 ENCOUNTER — Ambulatory Visit: Payer: Medicare Other | Admitting: Family Medicine

## 2023-09-04 ENCOUNTER — Telehealth: Payer: Self-pay | Admitting: Cardiovascular Disease

## 2023-09-04 ENCOUNTER — Telehealth: Payer: Self-pay | Admitting: Pharmacy Technician

## 2023-09-04 ENCOUNTER — Other Ambulatory Visit (HOSPITAL_COMMUNITY): Payer: Self-pay

## 2023-09-04 NOTE — Telephone Encounter (Signed)
Pharmacy Patient Advocate Encounter   Received notification from Pt Calls Messages that prior authorization for wainua is required/requested.   Insurance verification completed.   The patient is insured through [facility] .   Per test claim: Refill too soon. PA is not needed at this time. Medication was filled 08/24/23. Next eligible fill date is 09/14/23.

## 2023-09-04 NOTE — Telephone Encounter (Signed)
Pt c/o medication issue:  1. Name of Medication:   Eplontersen Sodium (WAINUA) 45 MG/0.8ML SOAJ    2. How are you currently taking this medication (dosage and times per day)?  Inject 45 mg into the skin every 30 (thirty) days.       3. Are you having a reaction (difficulty breathing--STAT)? No  4. What is your medication issue? Jasmine called from Pharmacy to provide office with the cover my meds key code [facility]

## 2023-09-06 ENCOUNTER — Telehealth: Payer: Self-pay | Admitting: Cardiovascular Disease

## 2023-09-06 NOTE — Telephone Encounter (Signed)
Calling about medication she is on. Please advise

## 2023-09-06 NOTE — Telephone Encounter (Signed)
Patient identification verified by 2 forms. Marilynn Rail, RN    Called and spoke to patient  Patient states:   -Needs to apply for Tafamidis patient assistance program to help cover cost   -current assistance already expired  Informed patient message sent for assistance  Patient verbalized understanding, no questions at this time

## 2023-09-07 ENCOUNTER — Ambulatory Visit (HOSPITAL_COMMUNITY)
Admission: RE | Admit: 2023-09-07 | Discharge: 2023-09-07 | Disposition: A | Discharge status: Home / Self Care | Payer: Medicare Other | Source: Ambulatory Visit | Attending: Family Medicine | Admitting: Family Medicine

## 2023-09-07 ENCOUNTER — Ambulatory Visit (INDEPENDENT_AMBULATORY_CARE_PROVIDER_SITE_OTHER): Payer: Medicare Other | Admitting: *Deleted

## 2023-09-07 DIAGNOSIS — I4891 Unspecified atrial fibrillation: Secondary | ICD-10-CM

## 2023-09-07 DIAGNOSIS — R519 Headache, unspecified: Secondary | ICD-10-CM | POA: Diagnosis not present

## 2023-09-07 DIAGNOSIS — D6869 Other thrombophilia: Secondary | ICD-10-CM

## 2023-09-07 DIAGNOSIS — I4819 Other persistent atrial fibrillation: Secondary | ICD-10-CM | POA: Diagnosis not present

## 2023-09-07 DIAGNOSIS — G319 Degenerative disease of nervous system, unspecified: Secondary | ICD-10-CM | POA: Diagnosis not present

## 2023-09-07 DIAGNOSIS — E8582 Wild-type transthyretin-related (ATTR) amyloidosis: Secondary | ICD-10-CM | POA: Diagnosis not present

## 2023-09-07 DIAGNOSIS — I6782 Cerebral ischemia: Secondary | ICD-10-CM | POA: Diagnosis not present

## 2023-09-07 DIAGNOSIS — Z7901 Long term (current) use of anticoagulants: Secondary | ICD-10-CM

## 2023-09-07 LAB — POCT INR: INR: 2.7 (ref 2.0–3.0)

## 2023-09-07 NOTE — Patient Instructions (Signed)
Description   (USE GREEN TABLET ONLY) Continue taking warfarin 1 tablet (2.5mg ) daily except for 1/2 tablet (1.25mg ) on Thursdays. Recheck INR in 4 weeks. Anticoagulation Clinic [phone]

## 2023-09-12 ENCOUNTER — Encounter (HOSPITAL_COMMUNITY): Payer: Medicare Other | Admitting: Internal Medicine

## 2023-09-13 ENCOUNTER — Other Ambulatory Visit: Payer: Self-pay | Admitting: Internal Medicine

## 2023-09-15 ENCOUNTER — Other Ambulatory Visit (HOSPITAL_COMMUNITY): Payer: Self-pay

## 2023-09-18 ENCOUNTER — Telehealth: Payer: Self-pay | Admitting: Pharmacy Technician

## 2023-09-18 NOTE — Telephone Encounter (Signed)
Pharmacy Patient Advocate Encounter   Received notification from Pt Calls Messages that prior authorization for wainua is required/requested.   Insurance verification completed.   The patient is insured through [facility] .   Per test claim: PA required; PA submitted to above mentioned insurance via CoverMyMeds Key/confirmation #/EOC [id number] Status is pending

## 2023-09-19 ENCOUNTER — Telehealth: Payer: Self-pay | Admitting: Family Medicine

## 2023-09-19 NOTE — Telephone Encounter (Signed)
I called and spoke with the reading room, they will send to radiologist to be read.

## 2023-09-19 NOTE — Telephone Encounter (Signed)
Pt is calling and would like the result of MRI of brain. Pt is aware radiologist is running behind

## 2023-09-20 ENCOUNTER — Telehealth: Payer: Self-pay | Admitting: Cardiovascular Disease

## 2023-09-20 NOTE — Telephone Encounter (Signed)
 FWD to pharmacy pool for assistance

## 2023-09-20 NOTE — Telephone Encounter (Signed)
Pt c/o medication issue:  1. Name of Medication:   Tafamidis 61 MG CAPS    2. How are you currently taking this medication (dosage and times per day)?  Take 1 capsule (61 mg total) by mouth daily.       3. Are you having a reaction (difficulty breathing--STAT)? No  4. What is your medication issue? Pt is requesting a callback regarding this medication due her to stating she'll need assistance with it again since it's supposed to be done each year. Please advise

## 2023-09-21 ENCOUNTER — Other Ambulatory Visit: Payer: Self-pay

## 2023-09-21 ENCOUNTER — Telehealth: Payer: Self-pay

## 2023-09-21 NOTE — Telephone Encounter (Signed)
Pharmacy Patient Advocate Encounter  Received notification from [facility] that Prior Authorization for wainua has been APPROVED from 10/18/23 to 10/16/24 . Previous pa is still active until 10/17/23   PA #/Case ID/Reference #: [id number]

## 2023-09-21 NOTE — Telephone Encounter (Signed)
Patient has been enrolled in the grant so it is good until 10/26/2023. Patient made aware. Will re-enroll patient in the grant on Jan 10,2025

## 2023-09-21 NOTE — Progress Notes (Signed)
Specialty Pharmacy Refill Coordination Note  Regina [name] is a 82 y.o. female contacted today regarding refills of specialty medication(s) Tafamidis   Patient requested Delivery   Delivery date: 10/02/23   Verified address: [address] [place], [address]   Medication will be filled on 09/29/23.

## 2023-09-21 NOTE — Telephone Encounter (Signed)
Patient aware and agrees with recommendation

## 2023-09-21 NOTE — Telephone Encounter (Signed)
Patient states that she has not been taking the Lantus/Toujeo because she is concern that her sugars will bottom out. They have been running 90-108 at bedtime. She last took long acting last week.

## 2023-09-25 ENCOUNTER — Other Ambulatory Visit (HOSPITAL_COMMUNITY): Payer: Self-pay

## 2023-09-25 NOTE — Telephone Encounter (Signed)
Pharmacy calling to get authorization for the patients medications. Key code [id number]. Please advise

## 2023-09-25 NOTE — Telephone Encounter (Signed)
Spoke with pt, she reports that the medication will be mailed to her by the 13th. Left message for [facility] of the information from the prior auth team. They are to call back with any questions.

## 2023-09-25 NOTE — Telephone Encounter (Signed)
Pharmacy Patient Advocate Encounter   Received notification from Physician's Office that prior authorization for [facility] is required/requested.   Insurance verification completed.   The patient is insured through  [place]  .   Per test claim: Refill too soon. PA is not needed at this time. Medication was filled 09/19/23. Next eligible fill date is 10/10/23. Per test claim patient just filled the drug on 09/19/23. Patient should not need a refill till the insurance will pay again on 10/10/23. We tried to update the renewal recently and were unable to do. Plan says patient has access to the drug so they may make [place] wait till closer to current PA expiration 10/17/23 to submit or till it expires. (See chart media)

## 2023-09-29 ENCOUNTER — Other Ambulatory Visit: Payer: Self-pay

## 2023-10-05 ENCOUNTER — Ambulatory Visit: Payer: Medicare Other | Attending: Cardiovascular Disease

## 2023-10-05 DIAGNOSIS — Z7901 Long term (current) use of anticoagulants: Secondary | ICD-10-CM

## 2023-10-05 DIAGNOSIS — I4819 Other persistent atrial fibrillation: Secondary | ICD-10-CM

## 2023-10-05 LAB — POCT INR: INR: 3.3 — AB (ref 2.0–3.0)

## 2023-10-05 NOTE — Patient Instructions (Signed)
Description   (USE GREEN TABLET ONLY) HOLD today's dose and then continue taking warfarin 1 tablet (2.5mg ) daily except for 1/2 tablet (1.25mg ) on Thursdays.  Recheck INR in 4 weeks. Anticoagulation Clinic [phone]

## 2023-10-06 ENCOUNTER — Encounter: Payer: Self-pay | Admitting: Internal Medicine

## 2023-10-06 ENCOUNTER — Ambulatory Visit: Payer: Medicare Other | Admitting: Internal Medicine

## 2023-10-06 VITALS — BP 124/60 | HR 80 | Resp 20 | Ht 62.0 in | Wt 228.6 lb

## 2023-10-06 DIAGNOSIS — E1122 Type 2 diabetes mellitus with diabetic chronic kidney disease: Secondary | ICD-10-CM | POA: Diagnosis not present

## 2023-10-06 DIAGNOSIS — N1831 Chronic kidney disease, stage 3a: Secondary | ICD-10-CM

## 2023-10-06 DIAGNOSIS — Z794 Long term (current) use of insulin: Secondary | ICD-10-CM

## 2023-10-06 DIAGNOSIS — E1140 Type 2 diabetes mellitus with diabetic neuropathy, unspecified: Secondary | ICD-10-CM | POA: Diagnosis not present

## 2023-10-06 DIAGNOSIS — E1159 Type 2 diabetes mellitus with other circulatory complications: Secondary | ICD-10-CM | POA: Diagnosis not present

## 2023-10-06 LAB — POCT GLYCOSYLATED HEMOGLOBIN (HGB A1C): Hemoglobin A1C: 6.3 % — AB (ref 4.0–5.6)

## 2023-10-06 MED ORDER — TIRZEPATIDE 7.5 MG/0.5ML ~~LOC~~ SOAJ: 7.5000 mg | SUBCUTANEOUS | 3 refills | Status: DC

## 2023-10-06 NOTE — Progress Notes (Signed)
Name: Regina [name]  Age/ Sex: 82 y.o., female   MRN/ DOB: [id number], January 09, 1941     PCP: [place], [name], MD   Reason for Endocrinology Evaluation: Type 2 Diabetes Mellitus  Initial Endocrine Consultative Visit: 09/09/2020    PATIENT IDENTIFIER: Regina [name] is a 82 y.o. female with a past medical history of T2DM, HTN, CAD, OSA on CPAP  and A.Fib. The patient has followed with Endocrinology clinic since 09/09/2020 for consultative assistance with management of her diabetes.  DIABETIC HISTORY:  Regina [name] was diagnosed with DM yrs ago. Her hemoglobin A1c has ranged from 6.9% in 2021, peaking at 7.8% in 2020   On her initial visit to our clinic she had an A1c of 7.4% She was on basal insulin and Ozemopic, she was already out of Ozempic and we held off until more data was available about her retinopathy. We started humalog per correction scale   She was started on Farxiga by cardiology 04/2021   Nephrology [facility] - Dr. Valentino Nose   Started [facility] 01/2023 Off basal insulin 05/2023 SUBJECTIVE:   During the last visit (04/27/2023): A1c 7.0 %    Today (10/06/2023): Regina [name] is here for a follow up on diabetes management.She is accompanied by her . She checks her blood sugars multiple times a day through CGM . The patient has had hypoglycemic episodes since the last clinic visit which typically occur a night     She is off prednisone for Polymyagia rheumatica  She had a follow-up with cardiology for cardiac amyloid    No nausea , vomiting  Denies constipation or diarrhea       HOME DIABETES REGIMEN:  Mounjaro 5 mg weekly  Toujeo 10 units daily - not taking  Humalog 3 TIDQAM - not taking  CF: Humalog: ( BG-130/35) Farxiga 10 mg daily-through cardiology    Statin: yes ACE-I/ARB: no    CONTINUOUS GLUCOSE MONITORING RECORD INTERPRETATION    Dates of Recording: 12/7-12/20/2024  Sensor description:dexcom  Results statistics:   CGM  use % of time 69  Average and SD 120/24.9  Time in range 95 %  % Time Above 180 4  % Time above 250 0  % Time Below target 1     Glycemic patterns summary: Bg's optimal throughout the day and night Hyperglycemic episodes  postprandial   Hypoglycemic episodes occurred rare during the day  Overnight periods: Optimal     DIABETIC COMPLICATIONS: Microvascular complications:  CKD III, Retinopathy ( hx of laser )  Denies: neuropathy  Last eye exam: Completed 2021   Macrovascular complications:  Non-obstructive CAD  Denies: PVD, CVA     HISTORY:  Past Medical History:  Past Medical History:  Diagnosis Date   Back pain    CHF (congestive heart failure) (HCC)    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 01/2015 Cardiolite: + inf wall ischemia, EF 56%;  b. 01/2015 Cath: LM nl, LAD 74m, LCX min irregs, RCA dominant, 50-50m.   Sleep apnea    Swallowing difficulty    Past Surgical History:  Past Surgical History:  Procedure Laterality Date   ABDOMINAL HYSTERECTOMY  BUBBLE STUDY  07/01/2020   Procedure: BUBBLE STUDY;  Surgeon: Parke Poisson, MD;  Location: [facility] ENDOSCOPY;  Service: Cardiovascular;;   BUBBLE STUDY  08/05/2020   Procedure: BUBBLE STUDY;  Surgeon: Sande Rives, MD;  Location: [facility] ENDOSCOPY;  Service: Cardiovascular;;  done with definity    CARDIAC CATHETERIZATION     ESOPHAGOGASTRODUODENOSCOPY     a. 01/2015 EGD: patent esophagus.   ESOPHAGOGASTRODUODENOSCOPY N/A 01/20/2015   Procedure: ESOPHAGOGASTRODUODENOSCOPY (EGD);  Surgeon: Jeani Hawking, MD;  Location: [facility] ENDOSCOPY;  Service: Endoscopy;  Laterality: N/A;   HAND SURGERY     JOINT REPLACEMENT     reports history  bilateral TKA and right TSA   LEFT HEART CATHETERIZATION WITH CORONARY ANGIOGRAM N/A 01/19/2015   RIGHT HEART CATH N/A 12/27/2021   Procedure: RIGHT HEART CATH;  Surgeon: Runell Gess, MD;  Location: [facility] INVASIVE CV LAB;  Service: Cardiovascular;  Laterality: N/A;   TEE WITHOUT CARDIOVERSION  05/04/2020   TEE WITHOUT CARDIOVERSION N/A 05/05/2020   Procedure: TRANSESOPHAGEAL ECHOCARDIOGRAM (TEE);  Surgeon: Chrystie Nose, MD;  Location: [facility] ENDOSCOPY;  Service: Cardiovascular;  Laterality: N/A;   TEE WITHOUT CARDIOVERSION N/A 07/01/2020   Procedure: TRANSESOPHAGEAL ECHOCARDIOGRAM (TEE);  Surgeon: Parke Poisson, MD;  Location: [facility] ENDOSCOPY;  Service: Cardiovascular;  Laterality: N/A;   TEE WITHOUT CARDIOVERSION N/A 08/05/2020   Procedure: TRANSESOPHAGEAL ECHOCARDIOGRAM (TEE);  Surgeon: Sande Rives, MD;  Location: [facility] ENDOSCOPY;  Service: Cardiovascular;  Laterality: N/A;   THYROIDECTOMY     Social History:  reports that she has never smoked. She has never used smokeless tobacco. She reports that she does not drink alcohol and does not use drugs. Family History:  Family History  Problem Relation Age of Onset   Heart disease Mother    Hypertension Mother    Cancer Father    Alcoholism Father      HOME MEDICATIONS: Allergies as of 10/06/2023       Reactions   Ace Inhibitors Other (See Comments)   Unknown reaction   Amlodipine Other (See Comments)   Unknown reaction   Atenolol Other (See Comments)   bradycardia   Avandia [rosiglitazone] Other (See Comments)   Unknown reaction   Darvon [propoxyphene] Other (See Comments)   Hallucinations    Erythromycin Itching   Hydralazine Other (See Comments)   Burning in throat and chest   Hydrocodone Other (See Comments)   Hallucinations.   Levofloxacin Itching   Morphine And Codeine Other (See Comments)   Dizzy and hallucianation, vomiting; Willing to try low dose   Percocet [oxycodone-acetaminophen] Other (See Comments)    hallucination   Spironolactone Other (See Comments)   Unknown reaction   Tramadol Other (See Comments)   Unknown/does not recall reaction but does not want to take again        Medication List        Accurate as of October 06, 2023  1:59 PM. If you have any questions, ask your nurse or doctor.          Accu-Chek [employer] Kit Use to test blood sugar up to 2 times daily as needed   Accu-Chek Guide test strip Generic drug: glucose blood Use to test blood sugar up to 2 times daily as needed   Accu-Chek Guide w/Device Kit Use to check blood sugars   acetaminophen 500 MG tablet Commonly known as: TYLENOL Take 1,000 mg by mouth every 6 (six) hours as needed for mild pain.   allopurinol 100 MG tablet Commonly  known as: ZYLOPRIM Take 1 tablet (100 mg total) by mouth every morning.   atorvastatin 20 MG tablet Commonly known as: LIPITOR TAKE ONE TABLET BY MOUTH EVERYDAY AT BEDTIME   B-D SINGLE USE SWABS REGULAR Pads Use to test blood sugar up to 3 times daily   cetirizine 10 MG chewable tablet Commonly known as: ZYRTEC Chew 10 mg by mouth daily.   DULoxetine 30 MG capsule Commonly known as: Cymbalta Take 1 capsule (30 mg total) by mouth daily.   Entresto 24-26 MG Generic drug: sacubitril-valsartan Take 1 tablet by mouth 2 (two) times daily.   Farxiga 10 MG Tabs tablet Generic drug: dapagliflozin propanediol Take 1 tablet (10 mg total) by mouth daily.   Fluocinolone Acetonide 0.01 % Oil instill one drop in ears every 2-3 DAYS AS NEEDED FOR pruritis   FreeStyle Libre 2 Reader [facility] Use as instructed to check blood sugar.   FreeStyle Libre 2 Sensor Misc USE TO MONITOR BLOOD SUGAR. CHANGE SENSOR EVERY 14 DAYS   gabapentin 600 MG tablet Commonly known as: NEURONTIN TAKE ONE TABLET BY MOUTH AT BREAKFAST AND AT BEDTIME   insulin lispro 100 UNIT/ML KwikPen Commonly known as: HumaLOG KwikPen Inject 4-8 Units into the skin daily as needed. Sliding scale:  Max daily 30 units   Insulin Pen Needle 32G X 4 MM Misc 1 Device by Does not apply route in the morning, at noon, in the evening, and at bedtime.   isosorbide mononitrate 30 MG 24 hr tablet Commonly known as: IMDUR Take 1 tablet (30 mg total) by mouth daily.   ketoconazole 2 % shampoo Commonly known as: Nizoral Apply 1 application. topically 2 (two) times a week. What changed:  when to take this reasons to take this   levothyroxine 150 MCG tablet Commonly known as: SYNTHROID TAKE ONE TABLET BY MOUTH BEFORE BREAKFAST   magnesium oxide 400 MG tablet Commonly known as: MAG-OX TAKE ONE TABLET BY MOUTH EVERY MORNING   metoprolol succinate 25 MG 24 hr tablet Commonly known as: TOPROL-XL TAKE ONE TABLET BY MOUTH ONCE DAILY with OR immedaitely following A meal   MIRALAX PO Take 1 Package by mouth daily as needed (constipation).   multivitamin tablet Take 1 tablet by mouth daily. K FREE DAILY once daily (MVI with no vitamin K)   nitroGLYCERIN 0.4 MG SL tablet Commonly known as: NITROSTAT Dissolve 1 tab under tongue as needed for chest pain. May repeat every 5 minutes x 2 doses. If no relief call 9-1-1.   ONE-A-DAY WOMENS PO Take 1 tablet by mouth daily.   pantoprazole 20 MG tablet Commonly known as: PROTONIX Take 1 tablet (20 mg total) by mouth daily.   potassium chloride SA 20 MEQ tablet Commonly known as: KLOR-CON M Take 2 tablets (40 mEq total) by mouth daily.   SYSTANE BALANCE OP Place 1 drop into both eyes daily as needed (for dry eyes).   tirzepatide 5 MG/0.5ML Pen Commonly known as: MOUNJARO Inject 5 mg into the skin once a week.   torsemide 20 MG tablet Commonly known as: DEMADEX Take 4 tablets (80 mg total) by mouth daily. May take an additional 20 mg as needed for swelling   Toujeo SoloStar 300 UNIT/ML Solostar Pen Generic drug: insulin glargine (1 Unit Dial) Inject 22 Units into the skin daily. Eat a snack with protein nightly before bedtime.    trolamine salicylate 10 % cream Commonly known as: ASPERCREME Apply 1 application. topically as needed for muscle pain.   Vyndamax 61  MG Caps Generic drug: Tafamidis Take 1 capsule (61 mg total) by mouth daily.   Wainua 45 MG/0.8ML Soaj Generic drug: Eplontersen Sodium Inject 45 mg into the skin every 30 (thirty) days.   warfarin 2.5 MG tablet Commonly known as: COUMADIN Take as directed by the anticoagulation clinic. If you are unsure how to take this medication, talk to your nurse or doctor. Original instructions: Take 1/2 a tablet to 1 tablet by mouth daily as directed by the coumadin clinic         OBJECTIVE:   Vital Signs: BP 124/60 (BP Location: Left Arm, Patient Position: Sitting, Cuff Size: Large)   Pulse 80   Resp 20   Ht 5\' 2"  (1.575 m)   Wt 228 lb 9.6 oz (103.7 kg)   SpO2 92%   BMI 41.81 kg/m   Wt Readings from Last 3 Encounters:  10/06/23 228 lb 9.6 oz (103.7 kg)  08/28/23 227 lb (103 kg)  08/03/23 224 lb (101.6 kg)     Exam: General: Pt appears well and is in NAD  Lungs: Clear with good BS bilat   Heart: RRR   Extremities: Trace  pretibial edema.   Neuro: MS is good with appropriate affect, pt is alert and Ox3     DM foot exam: 01/31/2023   The skin of the feet is intact without sores or ulcerations. The pedal pulses are 1+ B/L The sensation is decreased to a screening 5.07, 10 gram monofilament bilaterally   DATA REVIEWED:  Lab Results  Component Value Date   HGBA1C 7.0 (A) 04/27/2023   HGBA1C 7.7 (A) 01/31/2023   HGBA1C 7.5 (A) 08/01/2022    Latest Reference Range & Units 07/05/23 15:45  Sodium 135 - 145 mmol/L 135  Potassium 3.5 - 5.1 mmol/L 3.9  Chloride 98 - 111 mmol/L 97 (L)  CO2 22 - 32 mmol/L 27  Glucose 70 - 99 mg/dL 045 (H)  BUN 8 - 23 mg/dL 61 (H)  Creatinine 4.09 - 1.00 mg/dL 8.11 (H)  Calcium 8.9 - 10.3 mg/dL 9.5  Anion gap 5 - 15  11  GFR, Estimated >60 mL/min 20 (L)     ASSESSMENT / PLAN / RECOMMENDATIONS:    1) Type 2 Diabetes Mellitus, Optimally controlled, With retinopathic, neuropathic , CKD IV and Macrovascular   complications - Most recent A1c of 6.3%. Goal A1c < 7.5 %.    -A1c has been improving -She is on [place] through cardiology -She has been off basal insulin -She has been noted with hypoglycemia with using Humalog per correction scale, will adjust sensitivity factor -Patient interested in more weight loss, will increase Mounjaro as below    MEDICATIONS: Increase Mounjaro 7.5 mg weekly  Correction Scale  : Humalog ( BG -130/40) TIDQAC Patient on Farxiga 10 mg through  cardiology    EDUCATION / INSTRUCTIONS: BG monitoring instructions: Patient is instructed to check her blood sugars 3 times a day, before meals . Call [place] [facility] if: BG persistently < 70  I reviewed the Rule of 15 for the treatment of hypoglycemia in detail with the patient. Literature supplied.      2) Diabetic complications:  Eye: Does  have known diabetic retinopathy.  Neuro/ Feet: Does have known diabetic peripheral neuropathy. Renal: Patient does have known baseline CKD. She is not on an ACEI/ARB at present.      F/U in 6 months    Signed electronically by: Lyndle Herrlich, MD  [facility]  [facility]  Group [address] [place], [place] [address] Phone: [phone] FAX: [phone]   CC: [place], [name], MD [address] [place] [address] Phone: [phone]  Fax: [phone]  Return to Endocrinology clinic as below: Future Appointments  Date Time Provider Department Center  10/13/2023  3:30 PM [facility] PA/NP [facility] None  11/02/2023  2:00 PM [facility] None  01/09/2024  2:30 PM [place], [name], MD [facility]  02/28/2024  9:30 AM [facility]NUAL WELLNESS VISIT [facility]

## 2023-10-06 NOTE — Patient Instructions (Addendum)
-   Increase Mounjaro 7.5 mg once weekly  - Humalog correctional insulin: Use the scale below to help guide you BEFORE each meal   Blood sugar before meal Number of units to inject  Less than 170 0 unit  171 - 210 1 units  211 - 250 2 units  251 - 290 3 units  291 - 330 4 units  331 - 370 5 units  371 - 410 6 units  411 - 450 7  units     HOW TO TREAT LOW BLOOD SUGARS (Blood sugar LESS THAN 70 MG/DL) Please follow the RULE OF 15 for the treatment of hypoglycemia treatment (when your (blood sugars are less than 70 mg/dL)   STEP 1: Take 15 grams of carbohydrates when your blood sugar is low, which includes:  3-4 GLUCOSE TABS  OR 3-4 OZ OF JUICE OR REGULAR SODA OR ONE TUBE OF GLUCOSE GEL    STEP 2: RECHECK blood sugar in 15 MINUTES STEP 3: If your blood sugar is still low at the 15 minute recheck --> then, go back to STEP 1 and treat AGAIN with another 15 grams of carbohydrates.

## 2023-10-09 NOTE — Progress Notes (Signed)
ADVANCED HF CLINIC NOTE  Primary Care: [place], [name], MD Primary Cardiologist: Reatha Harps, MD HF Cardiologist: Dr. Gala Romney  CC: HF follow up  HPI: Regina [name] is a 82 y.o. woman with PMHx as below. Referred by Dr. Flora Lipps for further evaluation of he hATTR cardiac amyloidosis   Problem List 1.  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 Chronic AF - LAA sludge vs thrombus 05/05/2020  - persistent sludge 07/01/2020 - LAA thrombus persistent 08/05/2020 - on warfarin 3. DM - A1c 7.3 4. HTN 5. Non-obstructive CAD - LHC 01/19/2015 [facility] - 50% mid RCA and 50% LAD - MPI 08/14/2020 -> normal 6. OSA 7. CKD 4 (b/l Scr ~2.0) 8. CVA - 11/2020 @ [facility]  9. Polymyalgia Rheumatica  10. Morbid obesity  Currently on tafamadis (started 2021) and eplonteresen (started 2/24). Seen in f/u by Dr. Flora Lipps on 01/06/2023 with volume overload He increased her torsemide to 40/20  Echo repeated 02/03/2023 LVEF 40% with normal pressures.   She was seen in the ER 02/06/2023 for shortness of breath.  She was given IV Lasix with good result and discharged without admission.  She was seen by Bettina Gavia, PA on 02/09/23. Weight up 10 pounds despite mistakenly taking 120 mg torsemide daily. Labs with stable Scr 2.01  Initial visit with Dr. Gala Romney 5/24, NYHA III and volume up. Entresto started and given Furoscix for PRN home use with paramedicine support.  Follow up 03/24/23, remained volume overloaded with NYHA IIIb. Instructed to use Furoscix x 3 days. Saw Dr. Flora Lipps 04/03/23, volume up a bit but overall stable, advised to take her daily dose of diuretic (she had held it that day). Seen in ED 04/11/23 with CP. HsTroponin 54>>57. No ECG changes, symptoms improved with nitroglycerin, she  was started on Imdur 30 mg daily and felt stable for discharge home with close HF follow up.   Today she returns for HF follow up with her daughter. Overall feeling fine. She has SOB walking up steps but otherwise no DOE. Denies palpitations, CP, dizziness, edema, or PND/Orthopnea. Appetite ok. No fever or chills. Weight at home 222 pounds. Taking all medications. Continues to struggle with neuropathy, pain worse in hands.   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 01/2015 Cardiolite: + inf wall ischemia, EF 56%;  b. 01/2015 Cath: LM nl, LAD 60m, LCX min irregs, RCA dominant, 50-20m.   Sleep apnea    Swallowing difficulty    Current Outpatient Medications  Medication  Sig Dispense Refill   acetaminophen (TYLENOL) 500 MG tablet Take 1,000 mg by mouth every 6 (six) hours as needed for mild pain.     Alcohol Swabs (B-D SINGLE USE SWABS REGULAR) PADS Use to test blood sugar up to 3 times daily 100 each 3   allopurinol (ZYLOPRIM) 100 MG tablet Take 1 tablet (100 mg total) by mouth every morning. 90 tablet 1   atorvastatin (LIPITOR) 20 MG tablet TAKE ONE TABLET BY MOUTH EVERYDAY AT BEDTIME 90 tablet 3   Blood Glucose Monitoring Suppl (ACCU-CHEK GUIDE) w/Device KIT Use to check blood sugars 1 kit 0   cetirizine (ZYRTEC) 10 MG chewable tablet Chew 10 mg by mouth daily.     Continuous Blood Gluc Receiver (FREESTYLE  LIBRE 2 READER) DEVI Use as instructed to check blood sugar. 1 each 0   Continuous Glucose Sensor (FREESTYLE LIBRE 2 SENSOR) MISC USE TO MONITOR BLOOD SUGAR. CHANGE SENSOR EVERY 14 DAYS 7 each 3   DULoxetine (CYMBALTA) 30 MG capsule Take 1 capsule (30 mg total) by mouth daily. 30 capsule 1   Eplontersen Sodium (WAINUA) 45 MG/0.8ML SOAJ Inject 45 mg into the skin every 30 (thirty) days. 0.8 mL 12   FARXIGA 10 MG TABS tablet Take 1 tablet (10 mg total) by mouth daily. 90 tablet 3   Fluocinolone Acetonide 0.01 % OIL instill one drop in ears every 2-3 DAYS AS NEEDED FOR pruritis 20 mL 1   gabapentin (NEURONTIN) 600 MG tablet TAKE ONE TABLET BY MOUTH AT BREAKFAST AND AT BEDTIME 60 tablet 3   glucose blood (ACCU-CHEK GUIDE) test strip Use to test blood sugar up to 2 times daily as needed 100 each 3   insulin lispro (HUMALOG KWIKPEN) 100 UNIT/ML KwikPen Inject 4-8 Units into the skin daily as needed. Sliding scale: Max daily 30 units 30 mL 1   Insulin Pen Needle 32G X 4 MM MISC 1 Device by Does not apply route in the morning, at noon, in the evening, and at bedtime. 400 each 3   isosorbide mononitrate (IMDUR) 30 MG 24 hr tablet Take 1 tablet (30 mg total) by mouth daily. 30 tablet 7   ketoconazole (NIZORAL) 2 % shampoo Apply 1 application. topically 2 (two) times a week. (Patient taking differently: Apply 1 application  topically daily as needed for irritation.) 120 mL 2   Lancets Misc. (ACCU-CHEK FASTCLIX LANCET) KIT Use to test blood sugar up to 2 times daily as needed 1 kit 1   levothyroxine (SYNTHROID) 150 MCG tablet TAKE ONE TABLET BY MOUTH BEFORE BREAKFAST 90 tablet 3   magnesium oxide (MAG-OX) 400 MG tablet TAKE ONE TABLET BY MOUTH EVERY MORNING 90 tablet 1   metoprolol succinate (TOPROL-XL) 25 MG 24 hr tablet TAKE ONE TABLET BY MOUTH ONCE DAILY with OR immedaitely following A meal 90 tablet 3   Multiple Vitamin (MULTIVITAMIN) tablet Take 1 tablet by mouth daily. K FREE DAILY once daily (MVI with no  vitamin K)     nitroGLYCERIN (NITROSTAT) 0.4 MG SL tablet Dissolve 1 tab under tongue as needed for chest pain. May repeat every 5 minutes x 2 doses. If no relief call 9-1-1. 75 tablet 2   pantoprazole (PROTONIX) 20 MG tablet Take 1 tablet (20 mg total) by mouth daily. 30 tablet 2   Polyethylene Glycol 3350 (MIRALAX PO) Take 1 Package by mouth daily as needed (constipation).     potassium chloride SA (KLOR-CON M) 20 MEQ tablet Take 2 tablets (40 mEq total) by mouth  daily. 60 tablet 6   Propylene Glycol (SYSTANE BALANCE OP) Place 1 drop into both eyes daily as needed (for dry eyes).     sacubitril-valsartan (ENTRESTO) 24-26 MG Take 1 tablet by mouth 2 (two) times daily. 90 tablet 3   Tafamidis 61 MG CAPS Take 1 capsule (61 mg total) by mouth daily. 30 capsule 12   tirzepatide (MOUNJARO) 7.5 MG/0.5ML Pen Inject 7.5 mg into the skin once a week. (Patient taking differently: Inject 7.5 mg into the skin once a week. Tuesdays) 6 mL 3   torsemide (DEMADEX) 20 MG tablet Take 4 tablets (80 mg total) by mouth daily. May take an additional 20 mg as needed for swelling 120 tablet 6   trolamine salicylate (ASPERCREME) 10 % cream Apply 1 application. topically as needed for muscle pain.     warfarin (COUMADIN) 2.5 MG tablet Take 1/2 a tablet to 1 tablet by mouth daily as directed by the coumadin clinic 35 tablet 1   No current facility-administered medications for this encounter.   Allergies  Allergen Reactions   Ace Inhibitors Other (See Comments)    Unknown reaction   Amlodipine Other (See Comments)    Unknown reaction   Atenolol Other (See Comments)    bradycardia   Avandia [Rosiglitazone] Other (See Comments)    Unknown reaction   Darvon [Propoxyphene] Other (See Comments)    Hallucinations    Erythromycin Itching   Hydralazine Other (See Comments)    Burning in throat and chest   Hydrocodone Other (See Comments)    Hallucinations.   Levofloxacin Itching   Morphine And Codeine Other (See  Comments)    Dizzy and hallucianation, vomiting; Willing to try low dose   Percocet [Oxycodone-Acetaminophen] Other (See Comments)    hallucination   Spironolactone Other (See Comments)    Unknown reaction   Tramadol Other (See Comments)    Unknown/does not recall reaction but does not want to take again   Social History   Socioeconomic History   Marital status: Widowed    Spouse name: Not on file   Number of children: Not on file   Years of education: Not on file   Highest education level: Associate degree: academic program  Occupational History   Occupation: Retired  Tobacco Use   Smoking status: Never   Smokeless tobacco: Never  Vaping Use   Vaping status: Never Used  Substance and Sexual Activity   Alcohol use: No   Drug use: Never   Sexual activity: Not Currently  Other Topics Concern   Not on file  Social History Narrative   Lives with daughter   Social Drivers of Health   Financial Resource Strain: Low Risk  (07/07/2023)   Overall Financial Resource Strain (CARDIA)    Difficulty of Paying Living Expenses: Not hard at all  Food Insecurity: Unknown (07/07/2023)   Hunger Vital Sign    Worried About Running Out of Food in the Last Year: Patient declined    Ran Out of Food in the Last Year: Never true  Transportation Needs: No Transportation Needs (07/07/2023)   PRAPARE - Administrator, Civil Service (Medical): No    Lack of Transportation (Non-Medical): No  Physical Activity: Inactive (07/07/2023)   Exercise Vital Sign    Days of Exercise per Week: 0 days    Minutes of Exercise per Session: 0 min  Stress: No Stress Concern Present (07/07/2023)   [employer] of Occupational Health - Occupational Stress Questionnaire    Feeling  of Stress : Only a little  Social Connections: Moderately Integrated (07/07/2023)   Social Connection and Isolation Panel [NHANES]    Frequency of Communication with Friends and Family: More than three times a week     Frequency of Social Gatherings with Friends and Family: Once a week    Attends Religious Services: More than 4 times per year    Active Member of [employer] or Organizations: Yes    Attends Banker Meetings: More than 4 times per year    Marital Status: Widowed  Intimate Partner Violence: Not At Risk (02/20/2023)   Humiliation, Afraid, Rape, and Kick questionnaire    Fear of Current or Ex-Partner: No    Emotionally Abused: No    Physically Abused: No    Sexually Abused: No   Family History  Problem Relation Age of Onset   Heart disease Mother    Hypertension Mother    Cancer Father    Alcoholism Father    BP 122/60   Pulse (!) 103   Wt 104.1 kg (229 lb 6.4 oz)   SpO2 99%   BMI 41.96 kg/m   Wt Readings from Last 3 Encounters:  10/13/23 104.1 kg (229 lb 6.4 oz)  10/06/23 103.7 kg (228 lb 9.6 oz)  08/28/23 103 kg (227 lb)    PHYSICAL EXAM: General:  NAD. No resp difficulty, walked into clinic HEENT: Normal Neck: Supple. Thick neck. Carotids 2+ bilat; no bruits. No lymphadenopathy or thryomegaly appreciated. Cor: PMI nondisplaced. Irregular rate & rhythm. No rubs, gallops or murmurs. Lungs: Clear Abdomen: Soft, obese, nontender, nondistended. No hepatosplenomegaly. No bruits or masses. Good bowel sounds. Extremities: No cyanosis, clubbing, rash, edema Neuro: Alert & oriented x 3, cranial nerves grossly intact. Moves all 4 extremities w/o difficulty. Affect pleasant.  ECG (personally reviewed): atrial fibrillation 99 bpm  ReDs: 33%  ASSESSMENT & PLAN: 1. 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 Volume ok today, ReDs 33%. - Continue torsemide 80 mg daily + 40  KCL daily. OK to take extra torsemide 40 mg PRN with 20 KCL PRN -  Continue Tafamadis and eplonteresen - Continue Farxiga 10 mg daily. Op GU symptoms. - Continue Toprol XL 25 mg daily. - Continue Entresto 24/26 mg bid. Need to watch renal function closely.  - She has graduated from [employer]. - Labs today. - She has Furoscix at home for PRN use. Has not needed recently.  2. CKD 4 - Baseline Scr 2.0 - Watch closely. - Continue SGLT2i. - Most recent SCr 2.4, not a good dialysis candidate. - Labs today   3. Coronary artery disease  - Non-obstructive CAD in the past.   - No ASA as she is on warfarin  - Continue Imdur - Continue statin, most recent LDL 48 - Followed by Dr. Flora Lipps   4. Permanent AF - Rate controlled - Continue warfarin - Previously on Eliquis and Xarelto but switch to warfarin due to persistence of LAA clot - Zio 7 day (5/24) showed 100% AFL burden, HR 40-153 - If rates remain elevated, may be worth trial of DC-CV with amio support, but will defer for now.  5. Obesity  - Body mass index is 41.96 kg/m. - on tirzepatide  6. 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 Gala Romney.   Jacklynn Ganong, FNP  2:31 PM

## 2023-10-12 ENCOUNTER — Telehealth: Payer: Self-pay | Admitting: Pharmacy Technician

## 2023-10-12 ENCOUNTER — Telehealth: Payer: Self-pay | Admitting: Cardiovascular Disease

## 2023-10-12 ENCOUNTER — Other Ambulatory Visit (HOSPITAL_COMMUNITY): Payer: Self-pay

## 2023-10-12 NOTE — Telephone Encounter (Signed)
Pharmacy Patient Advocate Encounter   Received notification from Pt Calls Messages that prior authorization for wainua is required/requested.   Insurance verification completed.   The patient is insured through [facility] .   Per test claim: PA required; PA submitted to above mentioned insurance via CoverMyMeds Key/confirmation #/EOC [facility] Status is pending

## 2023-10-12 NOTE — Telephone Encounter (Signed)
Pt c/o medication issue:  1. Name of Medication:   Eplontersen Sodium (WAINUA) 45 MG/0.8ML SOAJ   2. How are you currently taking this medication (dosage and times per day)?   3. Are you having a reaction (difficulty breathing--STAT)?   4. What is your medication issue?   Caller [facility]) stated they will need to get prior authorization for the patient to get this medication in 2025. Caller provided Cover My Med Key Code: [facility]

## 2023-10-12 NOTE — Telephone Encounter (Signed)
Pharmacy Patient Advocate Encounter  Received notification from [facility] that Prior Authorization for wainua has been APPROVED from 10/18/23 to 10/16/24. Ran test claim, Copay is $0.00 one month. This test claim was processed through [facility]- copay amounts may vary at other pharmacies due to pharmacy/plan contracts, or as the patient moves through the different stages of their insurance plan.  Previous one on file is still good until 10/17/23 PA #/Case ID/Reference #: [id number]

## 2023-10-13 ENCOUNTER — Ambulatory Visit (HOSPITAL_COMMUNITY)
Admission: RE | Admit: 2023-10-13 | Discharge: 2023-10-13 | Disposition: A | Discharge status: Home / Self Care | Payer: Medicare Other | Source: Ambulatory Visit | Attending: Family Medicine | Admitting: Family Medicine

## 2023-10-13 ENCOUNTER — Other Ambulatory Visit: Payer: Self-pay | Admitting: Cardiovascular Disease

## 2023-10-13 ENCOUNTER — Encounter (HOSPITAL_COMMUNITY): Payer: Self-pay

## 2023-10-13 VITALS — BP 122/60 | HR 103 | Wt 229.4 lb

## 2023-10-13 DIAGNOSIS — I5022 Chronic systolic (congestive) heart failure: Secondary | ICD-10-CM | POA: Diagnosis not present

## 2023-10-13 DIAGNOSIS — Z794 Long term (current) use of insulin: Secondary | ICD-10-CM | POA: Diagnosis not present

## 2023-10-13 DIAGNOSIS — M353 Polymyalgia rheumatica: Secondary | ICD-10-CM | POA: Diagnosis not present

## 2023-10-13 DIAGNOSIS — I2510 Atherosclerotic heart disease of native coronary artery without angina pectoris: Secondary | ICD-10-CM | POA: Diagnosis not present

## 2023-10-13 DIAGNOSIS — Z8673 Personal history of transient ischemic attack (TIA), and cerebral infarction without residual deficits: Secondary | ICD-10-CM | POA: Diagnosis not present

## 2023-10-13 DIAGNOSIS — Z79899 Other long term (current) drug therapy: Secondary | ICD-10-CM | POA: Diagnosis not present

## 2023-10-13 DIAGNOSIS — Z6841 Body Mass Index (BMI) 40.0 and over, adult: Secondary | ICD-10-CM | POA: Diagnosis not present

## 2023-10-13 DIAGNOSIS — E1142 Type 2 diabetes mellitus with diabetic polyneuropathy: Secondary | ICD-10-CM | POA: Diagnosis not present

## 2023-10-13 DIAGNOSIS — N184 Chronic kidney disease, stage 4 (severe): Secondary | ICD-10-CM | POA: Diagnosis not present

## 2023-10-13 DIAGNOSIS — Z7901 Long term (current) use of anticoagulants: Secondary | ICD-10-CM | POA: Diagnosis not present

## 2023-10-13 DIAGNOSIS — I43 Cardiomyopathy in diseases classified elsewhere: Secondary | ICD-10-CM | POA: Insufficient documentation

## 2023-10-13 DIAGNOSIS — I4821 Permanent atrial fibrillation: Secondary | ICD-10-CM

## 2023-10-13 DIAGNOSIS — E854 Organ-limited amyloidosis: Secondary | ICD-10-CM | POA: Insufficient documentation

## 2023-10-13 DIAGNOSIS — I130 Hypertensive heart and chronic kidney disease with heart failure and stage 1 through stage 4 chronic kidney disease, or unspecified chronic kidney disease: Secondary | ICD-10-CM | POA: Insufficient documentation

## 2023-10-13 DIAGNOSIS — G629 Polyneuropathy, unspecified: Secondary | ICD-10-CM | POA: Diagnosis not present

## 2023-10-13 DIAGNOSIS — G4733 Obstructive sleep apnea (adult) (pediatric): Secondary | ICD-10-CM | POA: Diagnosis not present

## 2023-10-13 DIAGNOSIS — Z86718 Personal history of other venous thrombosis and embolism: Secondary | ICD-10-CM | POA: Insufficient documentation

## 2023-10-13 DIAGNOSIS — Z7985 Long-term (current) use of injectable non-insulin antidiabetic drugs: Secondary | ICD-10-CM | POA: Diagnosis not present

## 2023-10-13 DIAGNOSIS — Z7984 Long term (current) use of oral hypoglycemic drugs: Secondary | ICD-10-CM | POA: Diagnosis not present

## 2023-10-13 DIAGNOSIS — E1122 Type 2 diabetes mellitus with diabetic chronic kidney disease: Secondary | ICD-10-CM | POA: Diagnosis not present

## 2023-10-13 LAB — BASIC METABOLIC PANEL
Anion gap: 12 (ref 5–15)
BUN: 41 mg/dL — ABNORMAL HIGH (ref 8–23)
CO2: 27 mmol/L (ref 22–32)
Calcium: 9.2 mg/dL (ref 8.9–10.3)
Chloride: 95 mmol/L — ABNORMAL LOW (ref 98–111)
Creatinine, Ser: 2.18 mg/dL — ABNORMAL HIGH (ref 0.44–1.00)
GFR, Estimated: 22 mL/min — ABNORMAL LOW (ref >60)
Glucose, Bld: 146 mg/dL — ABNORMAL HIGH (ref 70–99)
Potassium: 4 mmol/L (ref 3.5–5.1)
Sodium: 134 mmol/L — ABNORMAL LOW (ref 135–145)

## 2023-10-13 LAB — BRAIN NATRIURETIC PEPTIDE: B Natriuretic Peptide: 517.2 pg/mL — ABNORMAL HIGH (ref 0.0–100.0)

## 2023-10-13 NOTE — Patient Instructions (Addendum)
Thank you for coming in today  If you had labs drawn today, any labs that are abnormal the clinic will call you No news is good news  Medications: Ok to take Torsemide as needed 40 mg (2 tablets) with Potassium 20 meq (1 tablet) as needed for 1-2 days max   Follow up appointments:  Your physician recommends that you schedule a follow-up appointment in:  3 months With Dr. Gala Romney     Do the following things EVERYDAY: Weigh yourself in the morning before breakfast. Write it down and keep it in a log. Take your medicines as prescribed Eat low salt foods--Limit salt (sodium) to 2000 mg per day.  Stay as active as you can everyday Limit all fluids for the day to less than 2 liters   At the [facility], you and your health needs are our priority. As part of our continuing mission to provide you with exceptional heart care, we have created designated Provider Care Teams. These Care Teams include your primary Cardiologist (physician) and Advanced Practice Providers (APPs- Physician Assistants and Nurse Practitioners) who all work together to provide you with the care you need, when you need it.   You may see any of the following providers on your designated Care Team at your next follow up: Dr Arvilla Meres Dr Marca Ancona Dr. Marcos Eke, NP Robbie Lis, [place] [facility] [place], [place] Brynda Peon, NP Karle Plumber, PharmD   Please be sure to bring in all your medications bottles to every appointment.    Thank you for choosing [facility]  If you have any questions or concerns before your next appointment please send [place] a message through [place] or call our office at [phone].    TO LEAVE A MESSAGE FOR THE NURSE SELECT OPTION 2, PLEASE LEAVE A MESSAGE INCLUDING: YOUR NAME DATE OF BIRTH CALL BACK NUMBER REASON FOR CALL**this is important as we prioritize the call backs  YOU WILL  RECEIVE A CALL BACK THE SAME DAY AS LONG AS YOU CALL BEFORE 4:00 PM

## 2023-10-13 NOTE — Telephone Encounter (Signed)
 Pt is requesting Coumadin

## 2023-10-13 NOTE — Telephone Encounter (Signed)
Prescription refill request received for warfarin Lov: 10/13/23 [facility])  Next INR check: 11/02/23 Warfarin tablet strength:  2.5mg   Appropriate dose. Refill sent.

## 2023-10-13 NOTE — Progress Notes (Signed)
ReDS Vest / Clip - 10/13/23 1400       ReDS Vest / Clip   Station Marker B    Ruler Value 39    ReDS Value Range Low volume    ReDS Actual Value 33

## 2023-10-19 ENCOUNTER — Encounter: Payer: Self-pay | Admitting: Internal Medicine

## 2023-10-24 ENCOUNTER — Other Ambulatory Visit: Payer: Self-pay

## 2023-10-24 ENCOUNTER — Other Ambulatory Visit (HOSPITAL_COMMUNITY): Payer: Self-pay

## 2023-10-24 ENCOUNTER — Telehealth: Payer: Self-pay | Admitting: Cardiovascular Disease

## 2023-10-24 ENCOUNTER — Telehealth: Payer: Self-pay | Admitting: Pharmacy Technician

## 2023-10-24 DIAGNOSIS — E854 Organ-limited amyloidosis: Secondary | ICD-10-CM

## 2023-10-24 MED ORDER — TAFAMIDIS 61 MG PO CAPS
61.0000 mg | ORAL_CAPSULE | Freq: Every day | ORAL | 12 refills | Status: DC
  Filled 2023-10-24 (×2): qty 30, 30d supply, fill #0

## 2023-10-24 NOTE — Telephone Encounter (Signed)
 She hasn't tired it. Looks like insurance now prefers [employer]. Both tafamidis. Please do PA for Vyndaquel 80mg  daily.

## 2023-10-24 NOTE — Telephone Encounter (Signed)
 Pt c/o medication issue:  1. Name of Medication:   Tafamidis  61 MG CAPS    2. How are you currently taking this medication (dosage and times per day)?    3. Are you having a reaction (difficulty breathing--STAT)? no  4. What is your medication issue? Patient states that the Dr has  to call to renew her prescription. Please advise

## 2023-10-24 NOTE — Telephone Encounter (Signed)
 Sent in prescription for Tafamidis 61 mg. Received pop up message that medication awaiting PA.

## 2023-10-24 NOTE — Telephone Encounter (Signed)
 Pharmacy Patient Advocate Encounter   Received notification from CoverMyMeds that prior authorization for Vyndamax  61MG  capsules is required/requested.   Insurance verification completed.   The patient is insured through [facility] .   Per test claim: PA required; PA submitted to above mentioned insurance via CoverMyMeds Key/confirmation #/EOC [id number] Status is pending

## 2023-10-24 NOTE — Telephone Encounter (Signed)
 PA request has been Submitted. New Encounter created for follow up. For additional info see Pharmacy Prior Auth telephone encounter from 10/24/23.

## 2023-10-24 NOTE — Telephone Encounter (Signed)
 Insurance is requesting: \  I do not see the answer to this in the chart, please advise.

## 2023-10-24 NOTE — Progress Notes (Signed)
 Specialty Pharmacy Refill Coordination Note  Regina [name] is a 83 y.o. female contacted today regarding refills of specialty medication(s) Tafamidis    Patient requested Delivery   Delivery date: 11/01/23   Verified address: [address], [place], [address]   Medication will be filled on 01.14.25.

## 2023-10-25 ENCOUNTER — Telehealth: Payer: Self-pay | Admitting: Pharmacy Technician

## 2023-10-25 ENCOUNTER — Other Ambulatory Visit (HOSPITAL_COMMUNITY): Payer: Self-pay

## 2023-10-25 NOTE — Telephone Encounter (Signed)
 Pharmacy Patient Advocate Encounter   Received notification from Pt Calls Messages that prior authorization for vyndaqel  is required/requested.   Insurance verification completed.   The patient is insured through [facility] .   Per test claim: PA required; PA submitted to above mentioned insurance via CoverMyMeds Key/confirmation #/EOC [id number] Status is pending

## 2023-10-25 NOTE — Telephone Encounter (Signed)
 Pharmacy Patient Advocate Encounter  Received notification from [employer] that Prior Authorization for vyndaqel  has been APPROVED from 10/25/23 to 10/16/24. Ran test claim, Copay is $0.00 one month. This test claim was processed through [facility]- copay amounts may vary at other pharmacies due to pharmacy/plan contracts, or as the patient moves through the different stages of their insurance plan.   PA #/Case ID/Reference #: [id number]

## 2023-10-26 ENCOUNTER — Other Ambulatory Visit: Payer: Self-pay

## 2023-10-26 ENCOUNTER — Other Ambulatory Visit (HOSPITAL_COMMUNITY): Payer: Self-pay

## 2023-10-26 ENCOUNTER — Ambulatory Visit: Payer: Self-pay | Admitting: Family Medicine

## 2023-10-26 ENCOUNTER — Emergency Department (HOSPITAL_BASED_OUTPATIENT_CLINIC_OR_DEPARTMENT_OTHER): Payer: Medicare Other

## 2023-10-26 ENCOUNTER — Telehealth: Payer: Self-pay | Admitting: Family Medicine

## 2023-10-26 ENCOUNTER — Encounter (HOSPITAL_BASED_OUTPATIENT_CLINIC_OR_DEPARTMENT_OTHER): Payer: Self-pay

## 2023-10-26 ENCOUNTER — Emergency Department (HOSPITAL_BASED_OUTPATIENT_CLINIC_OR_DEPARTMENT_OTHER)
Admission: EM | Admit: 2023-10-26 | Discharge: 2023-10-26 | Disposition: A | Discharge status: Home / Self Care | Payer: Medicare Other | Attending: Emergency Medicine | Admitting: Emergency Medicine

## 2023-10-26 DIAGNOSIS — E119 Type 2 diabetes mellitus without complications: Secondary | ICD-10-CM | POA: Diagnosis not present

## 2023-10-26 DIAGNOSIS — I700 Atherosclerosis of aorta: Secondary | ICD-10-CM | POA: Diagnosis not present

## 2023-10-26 DIAGNOSIS — R0989 Other specified symptoms and signs involving the circulatory and respiratory systems: Secondary | ICD-10-CM | POA: Diagnosis not present

## 2023-10-26 DIAGNOSIS — M7989 Other specified soft tissue disorders: Secondary | ICD-10-CM | POA: Diagnosis not present

## 2023-10-26 DIAGNOSIS — M79606 Pain in leg, unspecified: Secondary | ICD-10-CM | POA: Diagnosis not present

## 2023-10-26 DIAGNOSIS — Z794 Long term (current) use of insulin: Secondary | ICD-10-CM | POA: Insufficient documentation

## 2023-10-26 DIAGNOSIS — M79605 Pain in left leg: Secondary | ICD-10-CM | POA: Insufficient documentation

## 2023-10-26 DIAGNOSIS — M79604 Pain in right leg: Secondary | ICD-10-CM | POA: Diagnosis not present

## 2023-10-26 DIAGNOSIS — Z7901 Long term (current) use of anticoagulants: Secondary | ICD-10-CM | POA: Diagnosis not present

## 2023-10-26 DIAGNOSIS — I110 Hypertensive heart disease with heart failure: Secondary | ICD-10-CM | POA: Diagnosis not present

## 2023-10-26 DIAGNOSIS — I509 Heart failure, unspecified: Secondary | ICD-10-CM | POA: Insufficient documentation

## 2023-10-26 LAB — TROPONIN I (HIGH SENSITIVITY)
Troponin I (High Sensitivity): 47 ng/L — ABNORMAL HIGH (ref <18)
Troponin I (High Sensitivity): 45 ng/L — ABNORMAL HIGH (ref <18)

## 2023-10-26 LAB — BASIC METABOLIC PANEL
Anion gap: 11 (ref 5–15)
BUN: 38 mg/dL — ABNORMAL HIGH (ref 8–23)
CO2: 31 mmol/L (ref 22–32)
Calcium: 9.3 mg/dL (ref 8.9–10.3)
Chloride: 95 mmol/L — ABNORMAL LOW (ref 98–111)
Creatinine, Ser: 1.99 mg/dL — ABNORMAL HIGH (ref 0.44–1.00)
GFR, Estimated: 25 mL/min — ABNORMAL LOW (ref >60)
Glucose, Bld: 102 mg/dL — ABNORMAL HIGH (ref 70–99)
Potassium: 4.6 mmol/L (ref 3.5–5.1)
Sodium: 137 mmol/L (ref 135–145)

## 2023-10-26 LAB — CBC
HCT: 39.3 % (ref 36.0–46.0)
Hemoglobin: 12.9 g/dL (ref 12.0–15.0)
MCH: 30.4 pg (ref 26.0–34.0)
MCHC: 32.8 g/dL (ref 30.0–36.0)
MCV: 92.7 fL (ref 80.0–100.0)
Platelets: 254 10*3/uL (ref 150–400)
RBC: 4.24 MIL/uL (ref 3.87–5.11)
RDW: 15 % (ref 11.5–15.5)
WBC: 6 10*3/uL (ref 4.0–10.5)
nRBC: 0 % (ref 0.0–0.2)

## 2023-10-26 LAB — BRAIN NATRIURETIC PEPTIDE: B Natriuretic Peptide: 766.6 pg/mL — ABNORMAL HIGH (ref 0.0–100.0)

## 2023-10-26 MED ORDER — FUROSEMIDE 40 MG PO TABS
40.0000 mg | ORAL_TABLET | Freq: Once | ORAL | Status: AC
  Administered 2023-10-26: 40 mg via ORAL
  Filled 2023-10-26: qty 1

## 2023-10-26 MED ORDER — VYNDAQEL 20 MG PO CAPS
80.0000 mg | ORAL_CAPSULE | Freq: Every day | ORAL | 11 refills | Status: DC
  Filled 2023-10-26: qty 120, 30d supply, fill #0
  Filled 2023-11-24: qty 120, 30d supply, fill #1
  Filled 2023-12-22: qty 120, 30d supply, fill #2
  Filled 2024-01-19: qty 120, 30d supply, fill #3
  Filled 2024-02-22: qty 120, 30d supply, fill #4
  Filled 2024-03-26: qty 120, 30d supply, fill #5
  Filled 2024-04-18: qty 120, 30d supply, fill #6
  Filled 2024-05-15: qty 120, 30d supply, fill #7
  Filled 2024-06-10: qty 120, 30d supply, fill #8
  Filled 2024-07-10: qty 120, 30d supply, fill #9
  Filled 2024-08-09: qty 120, 30d supply, fill #10

## 2023-10-26 NOTE — ED Notes (Signed)
 Reviewed D/C information with the patient & family, both verbalized understanding. No additional concerns at this time.

## 2023-10-26 NOTE — ED Provider Notes (Signed)
 [place] [facility] Provider Note   CSN: [id number] Arrival date & time: 10/26/23  1800     History  Chief Complaint  Patient presents with   Leg Pain    Regina [name] is a 83 y.o. 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  Home nurse raise concern that the arterial flow of bilateral lower extremities was compromised and her primary care doctor encouraged her to come to the ED.  She endorses that she has been having some worsening shortness of breath with exertion but denies significant chest pain.  She reports occasional chest tightness.  She is in persistent A-fib, she reports that they had noted blood clots in her [facility] on TEE and so she has not been cardioverted in the past.  She reports that she has been taking her warfarin as prescribed.   Leg Pain      Home Medications Prior to Admission medications   Medication Sig Start Date End Date Taking? Authorizing Provider  acetaminophen  (TYLENOL ) 500 MG tablet Take 1,000 mg by mouth every 6 (six) hours as needed for mild pain.    [provider]  Alcohol  Swabs (B-D SINGLE USE SWABS REGULAR) PADS Use to test blood sugar up to 3 times daily 01/11/21   Shamleffer, Ibtehal Jaralla, MD  allopurinol  (ZYLOPRIM ) 100 MG tablet Take 1 tablet (100 mg total) by mouth every morning. 06/06/23   Jordan, [name], MD  atorvastatin  (LIPITOR) 20 MG tablet TAKE ONE TABLET BY MOUTH EVERYDAY AT BEDTIME 06/05/23   Bensimhon, Toribio SAUNDERS, MD  Blood Glucose Monitoring Suppl (ACCU-CHEK GUIDE) w/Device KIT Use to check blood sugars 07/25/23   Shamleffer, Donell Cardinal, MD  cetirizine (ZYRTEC) 10 MG chewable tablet Chew 10 mg by mouth daily.    [provider]  Continuous Blood  Gluc Receiver (FREESTYLE LIBRE 2 READER) DEVI Use as instructed to check blood sugar. 12/12/22   Shamleffer, Donell Cardinal, MD  Continuous Glucose Sensor (FREESTYLE LIBRE 2 SENSOR) MISC USE TO MONITOR BLOOD SUGAR. CHANGE SENSOR EVERY 14 DAYS 09/13/23   Shamleffer, Ibtehal Jaralla, MD  DULoxetine  (CYMBALTA ) 30 MG capsule Take 1 capsule (30 mg total) by mouth daily. 08/28/23   Jordan, [name], MD  Eplontersen  Sodium (WAINUA ) 45 MG/0.8ML SOAJ Inject 45 mg into the skin every 30 (thirty) days. 11/28/22   O'NealDarryle Ned, MD  FARXIGA  10 MG TABS tablet Take 1 tablet (10 mg total) by mouth daily. 06/05/23   Bensimhon, Toribio SAUNDERS, MD  Fluocinolone  Acetonide 0.01 % OIL instill one drop in ears every 2-3 DAYS AS NEEDED FOR pruritis 04/11/22   Jordan, [name], MD  gabapentin  (NEURONTIN ) 600 MG tablet TAKE ONE TABLET BY MOUTH AT [facility] AND AT BEDTIME 06/06/23   Jordan, [name], MD  glucose blood (ACCU-CHEK GUIDE) test strip Use to test blood sugar up to 2 times daily as needed 07/25/23   Shamleffer, Ibtehal Jaralla, MD  insulin  lispro (HUMALOG  KWIKPEN) 100 UNIT/ML KwikPen Inject 4-8 Units into the skin daily as needed. Sliding scale: Max daily 30 units 07/06/23   Shamleffer, Ibtehal Jaralla, MD  Insulin  Pen Needle 32G X 4 MM MISC 1 Device by Does not apply route in the morning, at noon, in the evening, and at bedtime. 08/01/22   Shamleffer, Ibtehal Jaralla,  MD  isosorbide  mononitrate (IMDUR ) 30 MG 24 hr tablet Take 1 tablet (30 mg total) by mouth daily. 06/05/23   Bensimhon, Toribio SAUNDERS, MD  ketoconazole  (NIZORAL ) 2 % shampoo Apply 1 application. topically 2 (two) times a week. Patient taking differently: Apply 1 application  topically daily as needed for irritation. 03/21/22   Jordan, [name], MD  Lancets Misc. (ACCU-CHEK FASTCLIX LANCET) KIT Use to test blood sugar up to 2 times daily as needed 07/25/23   Shamleffer, Ibtehal Jaralla, MD  levothyroxine  (SYNTHROID ) 150 MCG tablet TAKE ONE TABLET BY MOUTH BEFORE  BREAKFAST 06/06/23   Jordan, [name], MD  magnesium  oxide (MAG-OX) 400 MG tablet TAKE ONE TABLET BY MOUTH EVERY MORNING 05/12/23   Jordan, [name], MD  metoprolol  succinate (TOPROL -XL) 25 MG 24 hr tablet TAKE ONE TABLET BY MOUTH ONCE DAILY with OR immedaitely following A meal 06/05/23   Bensimhon, Toribio SAUNDERS, MD  Multiple Vitamin (MULTIVITAMIN) tablet Take 1 tablet by mouth daily. K FREE DAILY once daily (MVI with no vitamin K)    [provider]  nitroGLYCERIN  (NITROSTAT ) 0.4 MG SL tablet Dissolve 1 tab under tongue as needed for chest pain. May repeat every 5 minutes x 2 doses. If no relief call 9-1-1. 06/05/23   Bensimhon, Toribio SAUNDERS, MD  pantoprazole  (PROTONIX ) 20 MG tablet Take 1 tablet (20 mg total) by mouth daily. 06/06/23   Jordan, [name], MD  Polyethylene Glycol 3350  (MIRALAX  PO) Take 1 Package by mouth daily as needed (constipation).    [provider]  potassium chloride  SA (KLOR-CON  M) 20 MEQ tablet Take 2 tablets (40 mEq total) by mouth daily. 06/05/23   Bensimhon, Toribio SAUNDERS, MD  Propylene Glycol (SYSTANE BALANCE OP) Place 1 drop into both eyes daily as needed (for dry eyes).    [provider]  sacubitril -valsartan  (ENTRESTO ) 24-26 MG Take 1 tablet by mouth 2 (two) times daily. 06/05/23   Bensimhon, Toribio SAUNDERS, MD  Tafamidis  Meglumine , Cardiac, (VYNDAQEL ) 20 MG CAPS Take 4 capsules (80 mg total) by mouth daily. 10/26/23   O'NealDarryle Ned, MD  tirzepatide  (MOUNJARO ) 7.5 MG/0.5ML Pen Inject 7.5 mg into the skin once a week. Patient taking differently: Inject 7.5 mg into the skin once a week. Tuesdays 10/06/23   Shamleffer, Ibtehal Jaralla, MD  torsemide  (DEMADEX ) 20 MG tablet Take 4 tablets (80 mg total) by mouth daily. May take an additional 20 mg as needed for swelling 06/05/23   Bensimhon, Toribio SAUNDERS, MD  trolamine salicylate (ASPERCREME) 10 % cream Apply 1 application. topically as needed for muscle pain.    [provider]  warfarin (COUMADIN ) 2.5 MG tablet  TAKE 1/2 TO 1 (ONE-HALF TO ONE) TABLET BY MOUTH ONCE DAILY AS DIRECTED BY THE COUMADIN  CLINIC 10/13/23   O'Neal, Darryle Ned, MD      Allergies    Ace inhibitors, Amlodipine, Atenolol, Avandia [rosiglitazone], Darvon [propoxyphene], Erythromycin, Hydralazine , Hydrocodone, Levofloxacin, Morphine  and codeine, Percocet [oxycodone-acetaminophen ], Spironolactone, and Tramadol    Review of Systems   Review of Systems  All other systems reviewed and are negative.   Physical Exam Updated Vital Signs BP 116/64   Pulse 94   Temp 98.3 F (36.8 C)   Resp 17   SpO2 100%  Physical Exam Vitals and nursing note reviewed.  Constitutional:      General: She is not in acute distress.    Appearance: Normal appearance.  HENT:     Head: Normocephalic and atraumatic.  Eyes:  General:        Right eye: No discharge.        Left eye: No discharge.  Cardiovascular:     Rate and Rhythm: Normal rate and regular rhythm.     Pulses: Normal pulses.     Heart sounds: No murmur heard.    No friction rub. No gallop.     Comments: DP, PT pulses 1+ in affected bilateral lower extremities Pulmonary:     Effort: Pulmonary effort is normal.     Breath sounds: Normal breath sounds.  Abdominal:     General: Bowel sounds are normal.     Palpations: Abdomen is soft.  Musculoskeletal:     Comments: Mild ttp of the left lateral knee, suspect likely neuropathic in nature. No soft tissue swelling or edema noted  Skin:    General: Skin is warm and dry.     Capillary Refill: Capillary refill takes less than 2 seconds.  Neurological:     Mental Status: She is alert and oriented to person, place, and time.  Psychiatric:        Mood and Affect: Mood normal.        Behavior: Behavior normal.     ED Results / Procedures / Treatments   Labs (all labs ordered are listed, but only abnormal results are displayed) Labs Reviewed  BASIC METABOLIC PANEL - Abnormal; Notable for the following components:       Result Value   Chloride 95 (*)    Glucose, Bld 102 (*)    BUN 38 (*)    Creatinine, Ser 1.99 (*)    GFR, Estimated 25 (*)    All other components within normal limits  BRAIN NATRIURETIC PEPTIDE - Abnormal; Notable for the following components:   B Natriuretic Peptide 766.6 (*)    All other components within normal limits  TROPONIN I (HIGH SENSITIVITY) - Abnormal; Notable for the following components:   Troponin I (High Sensitivity) 47 (*)    All other components within normal limits  TROPONIN I (HIGH SENSITIVITY) - Abnormal; Notable for the following components:   Troponin I (High Sensitivity) 45 (*)    All other components within normal limits  CBC    EKG EKG Interpretation Date/Time:  Thursday October 26 2023 18:20:29 EST Ventricular Rate:  95 PR Interval:    QRS Duration:  91 QT Interval:  361 QTC Calculation: 464 R Axis:   69  Text Interpretation: Atrial fibrillation Low voltage, precordial leads Borderline repolarization abnormality agree. no sig change from previous Confirmed by Armenta Canning [phone]) on 10/26/2023 8:09:52 PM  Radiology US  Venous Img Lower Bilateral (DVT) Result Date: 10/26/2023 CLINICAL DATA:  Bilateral leg pain and swelling, initial encounter EXAM: BILATERAL LOWER EXTREMITY VENOUS DOPPLER ULTRASOUND TECHNIQUE: Gray-scale sonography with graded compression, as well as color Doppler and duplex ultrasound were performed to evaluate the lower extremity deep venous systems from the level of the common femoral vein and including the common femoral, femoral, profunda femoral, popliteal and calf veins including the posterior tibial, peroneal and gastrocnemius veins when visible. The superficial great saphenous vein was also interrogated. Spectral Doppler was utilized to evaluate flow at rest and with distal augmentation maneuvers in the common femoral, femoral and popliteal veins. COMPARISON:  None Available. FINDINGS: RIGHT LOWER EXTREMITY Common Femoral Vein: No  evidence of thrombus. Normal compressibility, respiratory phasicity and response to augmentation. Saphenofemoral Junction: No evidence of thrombus. Normal compressibility and flow on color Doppler imaging. Profunda Femoral Vein: No evidence of  thrombus. Normal compressibility and flow on color Doppler imaging. Femoral Vein: No evidence of thrombus. Normal compressibility, respiratory phasicity and response to augmentation. Popliteal Vein: No evidence of thrombus. Normal compressibility, respiratory phasicity and response to augmentation. Calf Veins: No evidence of thrombus. Normal compressibility and flow on color Doppler imaging. Superficial Great Saphenous Vein: No evidence of thrombus. Normal compressibility and flow on color Doppler imaging. Venous Reflux:  None. Other Findings:  None. LEFT LOWER EXTREMITY Common Femoral Vein: No evidence of thrombus. Normal compressibility, respiratory phasicity and response to augmentation. Saphenofemoral Junction: No evidence of thrombus. Normal compressibility and flow on color Doppler imaging. Profunda Femoral Vein: No evidence of thrombus. Normal compressibility and flow on color Doppler imaging. Femoral Vein: No evidence of thrombus. Normal compressibility, respiratory phasicity and response to augmentation. Popliteal Vein: No evidence of thrombus. Normal compressibility, respiratory phasicity and response to augmentation. Calf Veins: No evidence of thrombus. Normal compressibility and flow on color Doppler imaging. Superficial Great Saphenous Vein: No evidence of thrombus. Normal compressibility and flow on color Doppler imaging. Venous Reflux:  None. Other Findings:  None. IMPRESSION: No evidence of deep venous thrombosis. Electronically Signed   By: Oneil Devonshire M.D.   On: 10/26/2023 19:57   DG Chest Portable 1 View Result Date: 10/26/2023 CLINICAL DATA:  Leg pain EXAM: PORTABLE CHEST 1 VIEW COMPARISON:  04/10/2023 FINDINGS: Cardiac shadow is mildly prominent but  accentuated by the frontal technique. Aortic calcifications are noted. Mild central vascular congestion is seen without interstitial edema. No focal infiltrate is noted. IMPRESSION: Mild central vascular congestion without edema. Electronically Signed   By: Oneil Devonshire M.D.   On: 10/26/2023 19:12    Procedures Procedures    Medications Ordered in ED Medications  furosemide  (LASIX ) tablet 40 mg (has no administration in time range)    ED Course/ Medical Decision Making/ A&P                                 Medical Decision Making Amount and/or Complexity of Data Reviewed Labs: ordered. Radiology: ordered.   This patient is a 83 y.o. female  who presents to the ED for concern of chest tightness, shob, and leg pain with concern raised for either acute DVT or possible arterial vascular compromise.   Differential diagnoses prior to evaluation: The emergent differential diagnosis includes, but is not limited to,  ACS, AAS, PE, Mallory-Weiss, Boerhaave's, Pneumonia, acute bronchitis, asthma or COPD exacerbation, anxiety, MSK pain or traumatic injury to the chest, acid reflux versus other . This is not an exhaustive differential.   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 Pertinent results include: Reviewed lab work, imaging from previous emergency room visits, outpatient cardiology visits, reviewed previous echo, notably with EF of 40% in April of last year.  Overall stable compared to baseline.  Physical Exam: Physical exam performed. The pertinent findings include: No signs of overt fluid overload on exam, and she has good arterial flow of lower extremities.  She has no pale, mottled, or cold appearance of lower extremities.  She has strong 2+ DP, PT pulses in bilateral lower extremity.  She has some mild tenderness to  palpation around the left knee but the pain she describes seems likely neuropathic in nature.  Her vital signs been stable in the ED, she initially was  mildly hypertensive, blood pressure 157/90, but improved without significant intervention, BP 116/64 on recheck, her vital signs are otherwise stable.  She endorsed some shortness of breath with exertion and mild chest tightness with exertion, but is chest pain-free in the ED.  Lab Tests/Imaging studies: I personally interpreted labs/imaging and the pertinent results include: B MP is overall unremarkable, stable kidney function, BUN 38, creatinine 1.99.  Her CBC is unremarkable.  Her initial troponin is elevated at 47 but with delta at 45, no evidence of acute ACS, suspect likely secondary to her chronic heart failure.  Her BNP is up slightly higher than normal, BNP 766 today.  We will give her an extra dose of Lasix  and then encouraged continuing her normal fluid regimen.  I dependently interpreted DVT of lower extremities as well as plain film chest x-ray.  No DVT in bilateral lower extremities, chest x-ray shows mild central vascular congestion.   Cardiac monitoring: EKG obtained and interpreted by myself and attending physician which shows: A-fib which is known, chronic for patient, normal rate   Medications: I ordered medication including Lasix  x 1 for elevated BNP, no other additional recommendation for treatment at this time.  I have reviewed the patients home medicines and have made adjustments as needed.   Disposition: After consideration of the diagnostic results and the patients response to treatment, I feel that patient is stable for discharge with plan as above.   emergency department workup does not suggest an emergent condition requiring admission or immediate intervention beyond what has been performed at this time. The plan is: as above. The patient is safe for discharge and has been instructed to return immediately for worsening  symptoms, change in symptoms or any other concerns.  Final Clinical Impression(s) / ED Diagnoses Final diagnoses:  Left leg pain  Acute on chronic congestive heart failure, unspecified heart failure type [facility])    Rx / DC Orders ED Discharge Orders     None         Rosan Sherlean VEAR DEVONNA 10/26/23 2129    Armenta Canning, MD 10/26/23 2208

## 2023-10-26 NOTE — ED Notes (Signed)
 ED Provider at bedside.

## 2023-10-26 NOTE — ED Notes (Signed)
 Pt assisted to the restroom, ambulated without assistance.

## 2023-10-26 NOTE — Progress Notes (Signed)
 Clinical Intervention Note  Clinical Intervention Notes: Vyndamax  no longer preferred by insurance. MD office discontinued and resent as Vyndaqel . Contacted patient for counseling on changes.   Clinical Intervention Outcomes: Improved therapy adherence   Delon CHRISTELLA Brow Specialty Pharmacist

## 2023-10-26 NOTE — Telephone Encounter (Signed)
 Copied from CRM [id number]. Topic: General - Other >> Oct 25, 2023  4:30 PM Regina [name] wrote: Reason for CRM: patient's daughter called and stated that a [facility] nurse visited the pt on monday and stated that the patient has blood clots in her leg. She was unsure if someone from the home care facility called and notified Dr. jordan yet and asked for me to add the patient to Dr. Gib next available slot. The appt has been made for Monday. However, Regina [name] stated that she will take her mom to the ER since she did not get any information from the home care nurse. She requested for me to send a message to the clinic to notify pcp of this issue. Please call and advise with patient or daughter Regina [name].

## 2023-10-26 NOTE — Discharge Instructions (Signed)
 As we discussed you had a small increase in the enzyme that tells us  that there is some extra fluid on your lungs in the legs secondary to your heart failure.  We gave you an extra dose of your fluid pill in the ED, I do not think that you need any additional treatment other than continuing your home medications.  We took a close look at both the venous and arterial flow of your legs and do not see any abnormality, notably with no clots or other signs of artery compromise.  Please follow-up with your primary care doctor and continue take your blood thinner as prescribed.  It was a pleasure taking care of you today.

## 2023-10-26 NOTE — Addendum Note (Signed)
 Addended by: Malena Peer D on: 10/26/2023 12:57 PM   Modules accepted: Orders

## 2023-10-26 NOTE — ED Provider Notes (Signed)
 I provided a substantive portion of the care of this patient.  I personally made/approved the management plan for this patient and take responsibility for the patient management.  EKG Interpretation Date/Time:  Thursday October 26 2023 18:20:29 EST Ventricular Rate:  95 PR Interval:    QRS Duration:  91 QT Interval:  361 QTC Calculation: 464 R Axis:   69  Text Interpretation: Atrial fibrillation Low voltage, precordial leads Borderline repolarization abnormality agree. no sig change from previous Confirmed by Armenta Canning [phone]) on 10/26/2023 8:09:52 PM   Patient experiencing some lancinating pain to her lateral posterior left knee.  She was sent with concern for possible DVT or arterial vascular occlusion.  Patient has no associated symptoms of foot numbness, pain or swelling.  Patient is alert nontoxic.  Lower extremities are symmetric.  Patient has had bilateral knee replacements are well-healed.  No effusions of the joints.  Normal intact range of motion of the left knee without difficulty.  Focal area of some lancing what sounds like cutaneous nerve pain to the lateral posterior aspect.  No palpable or visual abnormalities.  No peripheral edema or calf tenderness.  Dorsalis pedis pulses 2+ and strong.  Feet warm and dry.  I agree with plan and  management.   Armenta Canning, MD 10/26/23 2033

## 2023-10-26 NOTE — ED Notes (Signed)
 Pt assisted to the restroom via wheelchair. Pt ambulated in the restroom without difficulty.

## 2023-10-26 NOTE — ED Triage Notes (Signed)
 Pt had home health nurse come to see pt and said they thought she might have blood clots in her left leg. Pt has been having pain for the past several days.

## 2023-10-26 NOTE — Telephone Encounter (Signed)
 Chief Complaint: Leg Swelling and Pain Symptoms: bilateral leg swelling with right leg worse than left leg. Tightness to both legs and pain Frequency: Since Monday Pertinent Negatives: Patient denies fever, chest pain, shortness of breath Disposition: [x] ED /[] Urgent Care (no appt availability in office) / [] Appointment(In office/virtual)/ []  [facility]/ [] Home Care/ [] Refused Recommended Disposition /[] [facility]/ []  Follow-up with PCP Additional Notes: patient with complicated heart history c/o bilateral leg swelling and pain. Swelling is worse on the right leg than left leg. Patient endorses pain to left knee area. Patient states both legs are tight and endorses some pain with ambulation. Patient is already on Warfarin with her heart history. Patient was visited by home health RN on Monday who according to patient placed a device on her toe which the [facility] RN told patient that she had blood clots in her right leg. HH RN according to patient, stated that she would call PCP office and gave instructions of walking around to the patient.  Patient's daughter attempted earlier today to contact the office and information was sent to PCP team with no answer.  Patient's daughter and patient called again this afternoon and were transferred to Nurse Triage.  Per protocol, the recommendation is for the emergency department. Patient and daughter verbalize understanding of plan and will be going to the ED.  All questions answered.    Copied from CRM [id number]. Topic: Clinical - Red Word Triage >> Oct 26, 2023  4:44 PM Corin V wrote: [facility] that prompted transfer to Nurse Triage: Patient's daughter calling in again as patient has blood clots in leg as found by home health and she has not heard back on follow up care instructions. CRM# [id number] has additional information on initial call. Home Health Agency- [facility] Reason for Disposition  Patient sounds very sick or weak to the  triager  Answer Assessment - Initial Assessment Questions 1. ONSET: When did the swelling start? (e.g., minutes, hours, days)     Monday 2. LOCATION: What part of the leg is swollen?  Are both legs swollen or just one leg?     Both legs but right is worse than left 3. SEVERITY: How bad is the swelling? (e.g., localized; mild, moderate, severe)   - Localized: Small area of swelling localized to one leg.   - MILD pedal edema: Swelling limited to foot and ankle, pitting edema < 1/4 inch (6 mm) deep, rest and elevation eliminate most or all swelling.   - MODERATE edema: Swelling of lower leg to knee, pitting edema > 1/4 inch (6 mm) deep, rest and elevation only partially reduce swelling.   - SEVERE edema: Swelling extends above knee, facial or hand swelling present.      Moderate 4. REDNESS: Does the swelling look red or infected?     NO 5. PAIN: Is the swelling painful to touch? If Yes, ask: How painful is it?   (Scale 1-10; mild, moderate or severe)     6 out of 10 6. FEVER: Do you have a fever? If Yes, ask: What is it, how was it measured, and when did it start?      No 7. CAUSE: What do you think is causing the leg swelling?     Concern for blood clots 8. MEDICAL HISTORY: Do you have a history of blood clots (e.g., DVT), cancer, heart failure, kidney disease, or liver failure?     Heart disease-is on Warfarin 9. RECURRENT SYMPTOM: Have you had  leg swelling before? If Yes, ask: When was the last time? What happened that time?     Yes 10. OTHER SYMPTOMS: Do you have any other symptoms? (e.g., chest pain, difficulty breathing)       no  Protocols used: Leg Swelling and Edema-A-AH

## 2023-10-27 ENCOUNTER — Other Ambulatory Visit: Payer: Self-pay

## 2023-10-27 NOTE — Telephone Encounter (Signed)
 duplicate

## 2023-10-27 NOTE — Telephone Encounter (Signed)
 FYI

## 2023-10-27 NOTE — Telephone Encounter (Signed)
Noted. BJ 

## 2023-10-30 ENCOUNTER — Encounter: Payer: Self-pay | Admitting: Family Medicine

## 2023-10-30 ENCOUNTER — Telehealth: Payer: Self-pay | Admitting: Cardiovascular Disease

## 2023-10-30 ENCOUNTER — Ambulatory Visit (INDEPENDENT_AMBULATORY_CARE_PROVIDER_SITE_OTHER): Payer: Medicare Other | Admitting: Family Medicine

## 2023-10-30 ENCOUNTER — Ambulatory Visit: Payer: Medicare Other | Admitting: Family Medicine

## 2023-10-30 VITALS — BP 128/80 | HR 90 | Resp 16 | Ht 62.0 in | Wt 225.0 lb

## 2023-10-30 DIAGNOSIS — E8582 Wild-type transthyretin-related (ATTR) amyloidosis: Secondary | ICD-10-CM | POA: Diagnosis not present

## 2023-10-30 DIAGNOSIS — E1140 Type 2 diabetes mellitus with diabetic neuropathy, unspecified: Secondary | ICD-10-CM | POA: Diagnosis not present

## 2023-10-30 DIAGNOSIS — N184 Chronic kidney disease, stage 4 (severe): Secondary | ICD-10-CM

## 2023-10-30 DIAGNOSIS — I4821 Permanent atrial fibrillation: Secondary | ICD-10-CM | POA: Diagnosis not present

## 2023-10-30 DIAGNOSIS — M159 Polyosteoarthritis, unspecified: Secondary | ICD-10-CM | POA: Diagnosis not present

## 2023-10-30 DIAGNOSIS — N644 Mastodynia: Secondary | ICD-10-CM | POA: Insufficient documentation

## 2023-10-30 DIAGNOSIS — Z794 Long term (current) use of insulin: Secondary | ICD-10-CM

## 2023-10-30 NOTE — Progress Notes (Addendum)
 Chief Complaint  Patient presents with   Follow-up   HPI: Regina [name] is a 83 y.o. female with a PMHx significant for HTN, HLD, DM II, hypothyroidism, CKD IV, CAD, HFrEF, cardiac amyloidosis, PAF, GERD, peripheral neuropathy, and OA, who is here today with her granddaughter to follow on a recent ED visit.  Patient went to the ED on 10/26/2023 after a home health nurse performing a medicare wellness visit told her she may have a blood clot after performing an examination or LE's. She was having LE edema and LLE shooting pain from hip down to distal leg.   She is on chronic anticoagulation. In the ED, she underwent LE VAS US  : Negative for DVT. CXR: Mild central vascular congestion without edema.  -CAD and aortic atherosclerosis: She is taking atorvastatin  20 mg daily. No hx of tobacco use. She has not noted feet cyanosis or skin lesions. HFrEF  on Torsemide  20 mg 4 tabs daily and Entresto  24-26 bid.  Generalized OA: Currently on cymbalta  30 mg daily. She says it is helping with her joint pain.  Reports no side effects. She denies any falls since her last visit.   She has been exercising using her new pedaling machine.   -CKD IV: Follows with nephrologist. Lab Results  Component Value Date   NA 137 10/26/2023   CL 95 (L) 10/26/2023   K 4.6 10/26/2023   CO2 31 10/26/2023   BUN 38 (H) 10/26/2023   CREATININE 1.99 (H) 10/26/2023   GFRNONAA 25 (L) 10/26/2023   CALCIUM  9.3 10/26/2023   ALBUMIN 3.9 05/17/2022   GLUCOSE 102 (H) 10/26/2023   Amyloidosis: currently on [place]  45 mg monthly injections for two years and Tafamidis  20 mg 4 caps daily. She is participating in a study, she believes it has helped some.   Left breast pain:  She is inquiring about her last mammogram. Patient also complains of left breast pain that has been occurring intermittently for many years.  She says she hasn't felt any lumps in her breast and has not noted skin changes or nipple discharge.    Broken tooth:  Her granddaughter mentions that  she just visit her dentist and was told she would need to have a tooth pulled because it was cracked an could get infected. Has not determined date of procedure.  Atrial fib on Coumadin  and metoprolol  succinate 25 mg daily..  DM II: Following with endocrinologist. On Farxiga  10 mg daily, mounjaro  7.5 mg weekly, and humalog .  Lab Results  Component Value Date   HGBA1C 6.3 (A) 10/06/2023   Review of Systems  Constitutional:  Negative for chills and fever.  HENT:  Negative for mouth sores, nosebleeds and sore throat.   Respiratory:  Negative for cough, shortness of breath and wheezing.   Cardiovascular:  Negative for chest pain and palpitations.  Gastrointestinal:  Negative for abdominal pain, nausea and vomiting.  Genitourinary:  Negative for decreased urine volume, dysuria and hematuria.  Musculoskeletal:  Positive for arthralgias and gait problem.  Skin:  Negative for rash.  Neurological:  Negative for syncope and facial asymmetry.  Psychiatric/Behavioral:  Negative for confusion and hallucinations.   See other pertinent positives and negatives in HPI.  Current Outpatient Medications on File Prior to Visit  Medication Sig Dispense Refill   acetaminophen  (TYLENOL ) 500 MG tablet Take 1,000 mg by mouth every 6 (six) hours as needed for mild pain.     Alcohol  Swabs (B-D SINGLE USE SWABS REGULAR) PADS Use to test  blood sugar up to 3 times daily 100 each 3   allopurinol  (ZYLOPRIM ) 100 MG tablet Take 1 tablet (100 mg total) by mouth every morning. 90 tablet 1   atorvastatin  (LIPITOR) 20 MG tablet TAKE ONE TABLET BY MOUTH EVERYDAY AT BEDTIME 90 tablet 3   Blood Glucose Monitoring Suppl (ACCU-CHEK GUIDE) w/Device KIT Use to check blood sugars 1 kit 0   cetirizine (ZYRTEC) 10 MG chewable tablet Chew 10 mg by mouth daily.     Continuous Blood Gluc Receiver (FREESTYLE LIBRE 2 READER) DEVI Use as instructed to check blood sugar. 1 each 0    Continuous Glucose Sensor (FREESTYLE LIBRE 2 SENSOR) MISC USE TO MONITOR BLOOD SUGAR. CHANGE SENSOR EVERY 14 DAYS 7 each 3   DULoxetine  (CYMBALTA ) 30 MG capsule Take 1 capsule (30 mg total) by mouth daily. 30 capsule 1   Eplontersen  Sodium (WAINUA ) 45 MG/0.8ML SOAJ Inject 45 mg into the skin every 30 (thirty) days. 0.8 mL 12   FARXIGA  10 MG TABS tablet Take 1 tablet (10 mg total) by mouth daily. 90 tablet 3   Fluocinolone  Acetonide 0.01 % OIL instill one drop in ears every 2-3 DAYS AS NEEDED FOR pruritis 20 mL 1   gabapentin  (NEURONTIN ) 600 MG tablet TAKE ONE TABLET BY MOUTH AT BREAKFAST AND AT BEDTIME 60 tablet 3   glucose blood (ACCU-CHEK GUIDE) test strip Use to test blood sugar up to 2 times daily as needed 100 each 3   insulin  lispro (HUMALOG  KWIKPEN) 100 UNIT/ML KwikPen Inject 4-8 Units into the skin daily as needed. Sliding scale: Max daily 30 units 30 mL 1   Insulin  Pen Needle 32G X 4 MM MISC 1 Device by Does not apply route in the morning, at noon, in the evening, and at bedtime. 400 each 3   isosorbide  mononitrate (IMDUR ) 30 MG 24 hr tablet Take 1 tablet (30 mg total) by mouth daily. 30 tablet 7   ketoconazole  (NIZORAL ) 2 % shampoo Apply 1 application. topically 2 (two) times a week. (Patient taking differently: Apply 1 application  topically daily as needed for irritation.) 120 mL 2   Lancets Misc. (ACCU-CHEK FASTCLIX LANCET) KIT Use to test blood sugar up to 2 times daily as needed 1 kit 1   levothyroxine  (SYNTHROID ) 150 MCG tablet TAKE ONE TABLET BY MOUTH BEFORE BREAKFAST 90 tablet 3   magnesium  oxide (MAG-OX) 400 MG tablet TAKE ONE TABLET BY MOUTH EVERY MORNING 90 tablet 1   metoprolol  succinate (TOPROL -XL) 25 MG 24 hr tablet TAKE ONE TABLET BY MOUTH ONCE DAILY with OR immedaitely following A meal 90 tablet 3   Multiple Vitamin (MULTIVITAMIN) tablet Take 1 tablet by mouth daily. K FREE DAILY once daily (MVI with no vitamin K)     nitroGLYCERIN  (NITROSTAT ) 0.4 MG SL tablet Dissolve 1 tab  under tongue as needed for chest pain. May repeat every 5 minutes x 2 doses. If no relief call 9-1-1. 75 tablet 2   pantoprazole  (PROTONIX ) 20 MG tablet Take 1 tablet (20 mg total) by mouth daily. 30 tablet 2   Polyethylene Glycol 3350  (MIRALAX  PO) Take 1 Package by mouth daily as needed (constipation).     potassium chloride  SA (KLOR-CON  M) 20 MEQ tablet Take 2 tablets (40 mEq total) by mouth daily. 60 tablet 6   Propylene Glycol (SYSTANE BALANCE OP) Place 1 drop into both eyes daily as needed (for dry eyes).     sacubitril -valsartan  (ENTRESTO ) 24-26 MG Take 1 tablet by mouth 2 (two) times  daily. 90 tablet 3   Tafamidis  Meglumine , Cardiac, (VYNDAQEL ) 20 MG CAPS Take 4 capsules (80 mg total) by mouth daily. 120 capsule 11   tirzepatide  (MOUNJARO ) 7.5 MG/0.5ML Pen Inject 7.5 mg into the skin once a week. (Patient taking differently: Inject 7.5 mg into the skin once a week. Tuesdays) 6 mL 3   torsemide  (DEMADEX ) 20 MG tablet Take 4 tablets (80 mg total) by mouth daily. May take an additional 20 mg as needed for swelling 120 tablet 6   trolamine salicylate (ASPERCREME) 10 % cream Apply 1 application. topically as needed for muscle pain.     warfarin (COUMADIN ) 2.5 MG tablet TAKE 1/2 TO 1 (ONE-HALF TO ONE) TABLET BY MOUTH ONCE DAILY AS DIRECTED BY THE COUMADIN  CLINIC 90 tablet 0   No current facility-administered medications on file prior to visit.    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 01/2015 Cardiolite : + inf wall ischemia, EF 56%;  b. 01/2015 Cath: LM nl, LAD 65m, LCX min irregs, RCA dominant, 50-49m.   Sleep  apnea    Swallowing difficulty    Allergies  Allergen Reactions   Ace Inhibitors Other (See Comments)    Unknown reaction   Amlodipine Other (See Comments)    Unknown reaction   Atenolol Other (See Comments)    bradycardia   Avandia [Rosiglitazone] Other (See Comments)    Unknown reaction   Darvon [Propoxyphene] Other (See Comments)    Hallucinations    Erythromycin Itching   Hydralazine  Other (See Comments)    Burning in throat and chest   Hydrocodone Other (See Comments)    Hallucinations.   Levofloxacin Itching   Morphine  And Codeine Other (See Comments)    Dizzy and hallucianation, vomiting; Willing to try low dose   Percocet [Oxycodone-Acetaminophen ] Other (See Comments)    hallucination   Spironolactone Other (See Comments)    Unknown reaction   Tramadol Other (See Comments)    Unknown/does not recall reaction but does not want to take again    Social History   Socioeconomic History   Marital status: Widowed    Spouse name: Not on file   Number of children: Not on file   Years of education: Not on file   Highest education level: Associate degree: academic program  Occupational History   Occupation: Retired  Tobacco Use   Smoking status: Never   Smokeless tobacco: Never  Vaping Use   Vaping status: Never Used  Substance and Sexual Activity   Alcohol  use: No   Drug use: Never   Sexual activity: Not Currently  Other Topics Concern   Not on file  Social History Narrative   Lives with daughter   Social Drivers of Health   Financial Resource Strain: Low Risk  (07/07/2023)   Overall Financial Resource Strain (CARDIA)    Difficulty of Paying Living Expenses: Not hard at all  Food Insecurity: Unknown (07/07/2023)   Hunger Vital Sign    Worried About Running Out of Food in the Last Year: Patient declined  Ran Out of Food in the Last Year: Never true  Transportation Needs: No Transportation Needs (07/07/2023)   PRAPARE - Scientist, Research (physical Sciences) (Medical): No    Lack of Transportation (Non-Medical): No  Physical Activity: Inactive (07/07/2023)   Exercise Vital Sign    Days of Exercise per Week: 0 days    Minutes of Exercise per Session: 0 min  Stress: No Stress Concern Present (07/07/2023)   [employer] of Occupational Health - Occupational Stress Questionnaire    Feeling of Stress : Only a little  Social Connections: Moderately Integrated (07/07/2023)   Social Connection and Isolation Panel [NHANES]    Frequency of Communication with Friends and Family: More than three times a week    Frequency of Social Gatherings with Friends and Family: Once a week    Attends Religious Services: More than 4 times per year    Active Member of [employer] or Organizations: Yes    Attends Banker Meetings: More than 4 times per year    Marital Status: Widowed    Vitals:   10/30/23 1447  BP: 128/80  Pulse: 90  Resp: 16  SpO2: 99%   Body mass index is 41.15 kg/m.  Physical Exam Vitals and nursing note reviewed.  Constitutional:      General: She is not in acute distress.    Appearance: She is well-developed.  HENT:     Head: Normocephalic and atraumatic.     Mouth/Throat:     Mouth: Mucous membranes are moist.     Pharynx: Oropharynx is clear. Uvula midline.  Eyes:     Conjunctiva/sclera: Conjunctivae normal.  Cardiovascular:     Rate and Rhythm: Normal rate and regular rhythm.     Heart sounds: No murmur heard.    Comments: DP pulses palpable. Pulmonary:     Effort: Pulmonary effort is normal. No respiratory distress.     Breath sounds: Normal breath sounds.  Abdominal:     Palpations: Abdomen is soft. There is no mass.     Tenderness: There is no abdominal tenderness.  Musculoskeletal:     Right lower leg: No edema.     Left lower leg: No edema.  Skin:    General: Skin is warm.     Findings: No erythema or rash.  Neurological:     General: No focal deficit present.     Mental Status: She is  alert and oriented to person, place, and time.     Gait: Gait normal.  Psychiatric:        Mood and Affect: Mood and affect normal.   ASSESSMENT AND PLAN:  Ms. Brach was seen today for ED follow up for possible blood clot.   Breast tenderness in female Assessment & Plan: Reporting having this problem for many years, stable otherwise. Last mammogram 08/2019, it was a Dx mammogram for left breast tenderness and it was Bi-Rads 2. She has not noted skin changes or masses. After discussion of possible causes and last mammogram results, she and her granddaughter (who is a engineer, civil (consulting)) decided on holding on mammogram for now, I agree. She will let me know if she changes up her mind and decides to have mammogram done. Continue monitoring for new symptoms.   Wild-type transthyretin-related (ATTR) amyloidosis (HCC) Assessment & Plan: With associated CKD IV,CHF, and neuropathy. Currently she is on Eplontersen  and Tafamidis . Following with cardiologist and nephrologist.   CKD (chronic kidney disease) stage 4, GFR 15-29 ml/min (HCC) Assessment &  Plan: Stable otherwise Cr 1.9 and e GFR 25 on 10/26/23. Estimated Creatinine Clearance: 25.2 mL/min (A) (by C-G formula based on SCr of 1.99 mg/dL (H)). Continue low salt diet, adequate hydration,and avoidance of NSAID's. Currently on Farxiga  10 mg daily and Valsartan  (with Entresto ). Following with nephrologist regularly.   Type 2 diabetes mellitus with diabetic neuropathy, with long-term current use of insulin  [facility]) Assessment & Plan: Last HgA1C 6.3 in 09/2023. Following with endocrinologist.   Permanent atrial fibrillation [facility]) Assessment & Plan: Rhythm controlled today. Currently on coumadin  and metoprolol  succinate. Follows with cardiologist.   Osteoarthritis of multiple joints, unspecified osteoarthritis type Assessment & Plan: She is on Duloxetine  30 mg daily, it was decreased from 60 mg last visit. She feels like medication is still  helping, so no changes.   In regard to broker tooth, following with dentist. Will need cardiac clearance to decide about coumadin  management prior to dental extraction.  Return in about 6 months (around 04/28/2024), or if symptoms worsen or fail to improve.  I, Leonce PARAS Wierda, acting as a scribe for Sahmya Arai, MD., have documented all relevant documentation on the behalf of Candiace West, MD, as directed by  Jahquez Steffler, MD while in the presence of Kearstyn Avitia, MD.   I, Alohilani Levenhagen, MD, have reviewed all documentation for this visit. The documentation on 10/30/23 for the exam, diagnosis, procedures, and orders are all accurate and complete.  Kota Ciancio G. Kamir Selover, MD  [facility]. [place] office.

## 2023-10-30 NOTE — Assessment & Plan Note (Signed)
 Last HgA1C 6.3 in 09/2023. Following with endocrinologist.

## 2023-10-30 NOTE — Patient Instructions (Addendum)
 A few things to remember from today's visit:  No changes today. Continue monitoring for left breast changes and let me know is you want to have a mammogram.  If you need refills for medications you take chronically, please call your pharmacy. Do not use My Chart to request refills or for acute issues that need immediate attention. If you send a my chart message, it may take a few days to be addressed, specially if I am not in the office.  Please be sure medication list is accurate. If a new problem present, please set up appointment sooner than planned today.

## 2023-10-30 NOTE — Assessment & Plan Note (Signed)
 Stable otherwise Cr 1.9 and e GFR 25 on 10/26/23. Estimated Creatinine Clearance: 25.2 mL/min (A) (by C-G formula based on SCr of 1.99 mg/dL (H)). Continue low salt diet, adequate hydration,and avoidance of NSAID's. Currently on Farxiga  10 mg daily and Valsartan  (with Entresto ). Following with nephrologist regularly.

## 2023-10-30 NOTE — Assessment & Plan Note (Signed)
 Rhythm controlled today. Currently on coumadin and metoprolol succinate. Follows with cardiologist.

## 2023-10-30 NOTE — Assessment & Plan Note (Signed)
 Reporting having this problem for many years, stable otherwise. Last mammogram 08/2019, it was a Dx mammogram for left breast tenderness and it was Bi-Rads 2. She has not noted skin changes or masses. After discussion of possible causes and last mammogram results, she and her granddaughter (who is a engineer, civil (consulting)) decided on holding on mammogram for now, I agree. She will let me know if she changes up her mind and decides to have mammogram done. Continue monitoring for new symptoms.

## 2023-10-30 NOTE — Assessment & Plan Note (Signed)
 She is on Duloxetine 30 mg daily, it was decreased from 60 mg last visit. She feels like medication is still helping, so no changes.

## 2023-10-30 NOTE — Telephone Encounter (Signed)
 error

## 2023-10-30 NOTE — Assessment & Plan Note (Signed)
 With associated CKD IV,CHF, and neuropathy. Currently she is on Eplontersen and Tafamidis. Following with cardiologist and nephrologist.

## 2023-11-02 ENCOUNTER — Telehealth: Payer: Self-pay | Admitting: Cardiovascular Disease

## 2023-11-02 ENCOUNTER — Ambulatory Visit: Payer: Medicare Other | Attending: Internal Medicine

## 2023-11-02 DIAGNOSIS — Z7901 Long term (current) use of anticoagulants: Secondary | ICD-10-CM | POA: Diagnosis not present

## 2023-11-02 DIAGNOSIS — I4819 Other persistent atrial fibrillation: Secondary | ICD-10-CM | POA: Diagnosis not present

## 2023-11-02 LAB — POCT INR: INR: 2.6 (ref 2.0–3.0)

## 2023-11-02 NOTE — Telephone Encounter (Signed)
   Patient Name: Regina [name]  DOB: [birth year] MRN: [id number]  Primary Cardiologist: Reatha Harps, MD  Chart reviewed as part of pre-operative protocol coverage.   Simple dental extractions (i.e. 1-2 teeth) are considered low risk procedures per guidelines and generally do not require any specific cardiac clearance. It is also generally accepted that for simple extractions and dental cleanings, there is no need to interrupt blood thinner therapy.   SBE prophylaxis is not required for the patient from a cardiac standpoint.  I will route this recommendation to the requesting party via Epic fax function and remove from pre-op pool.  Please call with questions.  Joylene Grapes, NP 11/02/2023, 12:05 PM

## 2023-11-02 NOTE — Patient Instructions (Signed)
Description   (USE GREEN TABLET ONLY) Continue taking warfarin 1 tablet (2.5mg ) daily except for 1/2 tablet (1.25mg ) on Thursdays.  Recheck INR in 5 weeks. Anticoagulation Clinic [phone]

## 2023-11-02 NOTE — Telephone Encounter (Signed)
   Pre-operative Risk Assessment    Patient Name: Regina [name]  DOB: 06-03-1941 MRN: [id number]   Date of last office visit: 10/13/2023 Date of next office visit: 01/11/2024  Request for Surgical Clearance    Procedure:  Dental Extraction - Amount of Teeth to be Pulled:  ONE TOOTH  simple extraction  Date of Surgery:  Clearance TBD                                Surgeon:  Dr. Luellen Pucker Surgeon's Group or Practice Name:  [facility], DDS Phone number:  [phone] Fax number:  [phone]   Type of Clearance Requested:   - Medical  - Pharmacy:  Hold Warfarin (Coumadin)     Type of Anesthesia:  Local    Additional requests/questions:    Sharen Hones   11/02/2023, 10:52 AM

## 2023-11-06 ENCOUNTER — Telehealth: Payer: Self-pay

## 2023-11-06 ENCOUNTER — Other Ambulatory Visit: Payer: Self-pay | Admitting: Family Medicine

## 2023-11-06 DIAGNOSIS — M2550 Pain in unspecified joint: Secondary | ICD-10-CM

## 2023-11-06 DIAGNOSIS — K219 Gastro-esophageal reflux disease without esophagitis: Secondary | ICD-10-CM

## 2023-11-06 DIAGNOSIS — M353 Polymyalgia rheumatica: Secondary | ICD-10-CM

## 2023-11-06 NOTE — Telephone Encounter (Signed)
I spoke to patient who was advised by her dentist to Hold Coumadin tonight (post dental work, tooth pulled) and resume tomorrow.  I told her that is fine if they advised her.

## 2023-11-09 ENCOUNTER — Telehealth: Payer: Self-pay | Admitting: Cardiovascular Disease

## 2023-11-09 ENCOUNTER — Other Ambulatory Visit (HOSPITAL_COMMUNITY): Payer: Self-pay

## 2023-11-09 ENCOUNTER — Telehealth: Payer: Self-pay | Admitting: Pharmacy Technician

## 2023-11-09 NOTE — Telephone Encounter (Signed)
Looks like this was changed on 10/26/23 to Tafamidis Meglumine, Cardiac, (VYNDAQEL) 20 MG CAPS and the vyndamax 61mg  was discontinued ===View-only below this line=== ----- Message ----- From: Marilynn Rail, RN Sent: 11/09/2023  10:54 AM EST To: Rx Prior Auth Team  ----- Message from Marilynn Rail, RN sent at 11/09/2023 10:54 AM EST ----- Hello  Was there a PA needed for this prescription?

## 2023-11-09 NOTE — Telephone Encounter (Signed)
Patient stated she received a letter from [facility] denying her Vyndamax, 61 mg medication.  Patient wants call back to discuss next steps.

## 2023-11-09 NOTE — Telephone Encounter (Signed)
[name], CPhT  You22 minutes ago (3:15 PM)    Looks like this was changed on 10/26/23 to Tafamidis Meglumine, Cardiac, (VYNDAQEL) 20 MG CAPS and the vyndamax 61mg  was discontinued   Patient identification verified by 2 forms. Regina Rail, RN    Called and spoke to patient  Relayed message  Patient states she takes Tafamidis  Patient verbalized understanding, no questions at this time

## 2023-11-16 ENCOUNTER — Other Ambulatory Visit: Payer: Self-pay | Admitting: Family Medicine

## 2023-11-17 ENCOUNTER — Other Ambulatory Visit: Payer: Self-pay

## 2023-11-17 DIAGNOSIS — N1832 Chronic kidney disease, stage 3b: Secondary | ICD-10-CM | POA: Diagnosis not present

## 2023-11-17 MED ORDER — DULOXETINE HCL 30 MG PO CPEP: 30.0000 mg | ORAL_CAPSULE | Freq: Every day | ORAL | 5 refills | Status: DC

## 2023-11-21 DIAGNOSIS — I2510 Atherosclerotic heart disease of native coronary artery without angina pectoris: Secondary | ICD-10-CM | POA: Diagnosis not present

## 2023-11-21 DIAGNOSIS — I129 Hypertensive chronic kidney disease with stage 1 through stage 4 chronic kidney disease, or unspecified chronic kidney disease: Secondary | ICD-10-CM | POA: Diagnosis not present

## 2023-11-21 DIAGNOSIS — E1122 Type 2 diabetes mellitus with diabetic chronic kidney disease: Secondary | ICD-10-CM | POA: Diagnosis not present

## 2023-11-21 DIAGNOSIS — N1832 Chronic kidney disease, stage 3b: Secondary | ICD-10-CM | POA: Diagnosis not present

## 2023-11-21 DIAGNOSIS — E854 Organ-limited amyloidosis: Secondary | ICD-10-CM | POA: Diagnosis not present

## 2023-11-21 DIAGNOSIS — E785 Hyperlipidemia, unspecified: Secondary | ICD-10-CM | POA: Diagnosis not present

## 2023-11-21 DIAGNOSIS — I509 Heart failure, unspecified: Secondary | ICD-10-CM | POA: Diagnosis not present

## 2023-11-21 DIAGNOSIS — I43 Cardiomyopathy in diseases classified elsewhere: Secondary | ICD-10-CM | POA: Diagnosis not present

## 2023-11-24 ENCOUNTER — Other Ambulatory Visit: Payer: Self-pay

## 2023-11-24 ENCOUNTER — Encounter (HOSPITAL_COMMUNITY): Payer: Self-pay

## 2023-11-24 NOTE — Progress Notes (Signed)
 Specialty Pharmacy Refill Coordination Note  [name] is a 83 y.o. female contacted today regarding refills of specialty medication(s) Tafamidis  Meglumine  (Cardiac) (Vyndaqel )   Patient requested Delivery   Delivery date: 11/29/23   Verified address: [address] [place], [address]   Medication will be filled on 11/28/23.

## 2023-11-24 NOTE — Progress Notes (Signed)
 Specialty Pharmacy Ongoing Clinical Assessment Note  Regina [name] is a 83 y.o. female who is being followed by the specialty pharmacy service for RxSp Cardiology   Patient's specialty medication(s) reviewed today: Tafamidis  Meglumine  (Cardiac) (Vyndaqel )   Missed doses in the last 4 weeks: 0   Patient/Caregiver did not have any additional questions or concerns.   Therapeutic benefit summary: Unable to assess   Adverse events/side effects summary: No adverse events/side effects   Patient's therapy is appropriate to: Continue    Goals Addressed             This Visit's Progress    Stabilization of disease       Patient is on track. Patient will maintain adherence         Follow up:  6 months  Regina [name] Specialty Pharmacist

## 2023-11-28 ENCOUNTER — Other Ambulatory Visit (HOSPITAL_COMMUNITY): Payer: Self-pay

## 2023-11-28 ENCOUNTER — Encounter: Payer: Self-pay | Admitting: Primary Care

## 2023-11-28 ENCOUNTER — Ambulatory Visit (INDEPENDENT_AMBULATORY_CARE_PROVIDER_SITE_OTHER): Payer: Medicare Other | Admitting: Primary Care

## 2023-11-28 ENCOUNTER — Telehealth: Payer: Self-pay | Admitting: Cardiovascular Disease

## 2023-11-28 ENCOUNTER — Other Ambulatory Visit: Payer: Self-pay

## 2023-11-28 VITALS — BP 113/77 | HR 88 | Temp 98.3°F | Ht 62.0 in | Wt 226.2 lb

## 2023-11-28 DIAGNOSIS — G4733 Obstructive sleep apnea (adult) (pediatric): Secondary | ICD-10-CM | POA: Diagnosis not present

## 2023-11-28 MED ORDER — WAINUA 45 MG/0.8ML ~~LOC~~ SOAJ: 45.0000 mg | SUBCUTANEOUS | 3 refills | Status: DC

## 2023-11-28 NOTE — Telephone Encounter (Signed)
*  STAT* If patient is at the pharmacy, call can be transferred to refill team.   1. Which medications need to be refilled? (please list name of each medication and dose if known) Eplontersen Sodium (WAINUA) 45 MG/0.8ML SOAJ   2. Which pharmacy/location (including street and city if local pharmacy) is medication to be sent to?  [facility], [place] - [address]  3. Do they need a 30 day or 90 day supply? 90

## 2023-11-28 NOTE — Patient Instructions (Addendum)
 -  OBSTRUCTIVE SLEEP APNEA: Obstructive sleep apnea is a condition where your breathing stops and starts during sleep due to blocked airways. You are using your CPAP machine 80% of the time, but you need to aim for at least 4 hours of use each night. We will adjust the humidification and pressure settings on your CPAP machine to help with the burning sensation in your nose. You may also try using a nasal gel for added comfort. Please check back in 6 months or sooner if the issues persist.  -LUNG NODULES: Lung nodules are small growths in the lungs. Your previous 6mm nodule has resolved, and the remaining 2mm nodule is stable and considered benign. No further follow-up or CT scans are needed for your lung nodules at this time.  Dme order- change CPAP pressure 9cm h20 and humidification to level 5  Follow-up: Please follow up in 6 months with Dr. Wynona Neat (or [facility] NP) for your sleep apnea, or sooner if you continue to experience issues with your CPAP machine.

## 2023-11-28 NOTE — Progress Notes (Unsigned)
@Patient  ID: Regina [name], female    DOB: 1941-09-13, 83 y.o.   MRN: [id number]  Chief Complaint  Patient presents with   Follow-up    Referring provider: [place], [name], MD  HPI: 83 year old female, smoked.  Past medical history significant for A-fib, coronary artery disease, congestive heart failure, hypertension, OSA on CPAP, lung nodules, hep C, GERD, type 2 diabetes, hypothyroidism, kidney disease, history of TIA, osteoarthritis.  11/28/2023- Interim hx  Patient presents today for a 1 year follow-up OSA/pulmonary nodules.  CT chest in May 2021 showed stable lung nodules.   Discussed the use of AI scribe software for clinical note transcription with the patient, who gave verbal consent to proceed.  History of Present Illness   Regina [name] is an 83 year old female with sleep apnea and lung nodules who presents for follow-up.  She has been using a CPAP machine for her sleep apnea. A download from the last 90 days shows that she is wearing the CPAP 80% of the time, but not always for more than four hours per night. She experiences a burning sensation in her nose, which she has mentioned before. The humidification setting on her CPAP was changed last week, but this did not alleviate the burning sensation. She uses distilled water in the humidifier and changes it regularly. She has tried using a nasal spray, specifically saline, which she describes as looking like a 'grease or gel,' but it has not been effective.  Regarding her lung nodules, a CT scan in 2021 showed stable nodules, and a subsequent CT scan in 2022 indicated resolution of a previous 6 mm nodule in the right middle lobe, likely related to an infection or inflammation. There remains a tiny pulmonary nodule, no greater than 2 mm, which is stable and considered benign. No further follow-up is needed for the lung nodules at this time.         Airview download 08/28/2023 - 11/25/2023 Usage days 72/30 days  (80%) Average usage days used 3 hours 55 minutes Pressure 4 to 10 cm H2O (9.2 cm H2O-95%) Air leaks 35 L/min (95%) AHI 4.4  Allergies  Allergen Reactions   Ace Inhibitors Other (See Comments)    Unknown reaction   Amlodipine Other (See Comments)    Unknown reaction   Atenolol Other (See Comments)    bradycardia   Avandia [Rosiglitazone] Other (See Comments)    Unknown reaction   Darvon [Propoxyphene] Other (See Comments)    Hallucinations    Erythromycin Itching   Hydralazine Other (See Comments)    Burning in throat and chest   Hydrocodone Other (See Comments)    Hallucinations.   Levofloxacin Itching   Morphine And Codeine Other (See Comments)    Dizzy and hallucianation, vomiting; Willing to try low dose   Percocet [Oxycodone-Acetaminophen] Other (See Comments)    hallucination   Spironolactone Other (See Comments)    Unknown reaction   Tramadol Other (See Comments)    Unknown/does not recall reaction but does not want to take again    Immunization History  Administered Date(s) Administered   Fluad Quad(high Dose 65+) 06/21/2019, 08/01/2022   Fluad Trivalent(High Dose 65+) 07/12/2023   Influenza,inj,Quad PF,6+ Mos 01/17/2015   Influenza-Unspecified 01/17/2015, 10/25/2016   PFIZER(Purple Top)SARS-COV-2 Vaccination 11/06/2019, 11/27/2019, 08/12/2020, 10/23/2021   PNEUMOCOCCAL CONJUGATE-20 12/28/2021   Pneumococcal Polysaccharide-23 05/01/2020   Zoster Recombinant(Shingrix) 10/23/2021, 10/23/2021    Past Medical History:  Diagnosis Date   Back pain    CHF (congestive  heart failure) (HCC)    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 01/2015 Cardiolite: + inf wall ischemia, EF 56%;   b. 01/2015 Cath: LM nl, LAD 4m, LCX min irregs, RCA dominant, 50-60m.   Sleep apnea    Swallowing difficulty     Tobacco History: Social History   Tobacco Use  Smoking Status Never  Smokeless Tobacco Never   Counseling given: Not Answered   Outpatient Medications Prior to Visit  Medication Sig Dispense Refill   acetaminophen (TYLENOL) 500 MG tablet Take 1,000 mg by mouth every 6 (six) hours as needed for mild pain.     Alcohol Swabs (B-D SINGLE USE SWABS REGULAR) PADS Use to test blood sugar up to 3 times daily 100 each 3   allopurinol (ZYLOPRIM) 100 MG tablet Take 1 tablet (100 mg total) by mouth every morning. 90 tablet 1   atorvastatin (LIPITOR) 20 MG tablet TAKE ONE TABLET BY MOUTH EVERYDAY AT BEDTIME 90 tablet 3   Blood Glucose Monitoring Suppl (ACCU-CHEK GUIDE) w/Device KIT Use to check blood sugars 1 kit 0   cetirizine (ZYRTEC) 10 MG chewable tablet Chew 10 mg by mouth daily.     Continuous Blood Gluc Receiver (FREESTYLE LIBRE 2 READER) DEVI Use as instructed to check blood sugar. 1 each 0   Continuous Glucose Sensor (FREESTYLE LIBRE 2 SENSOR) MISC USE TO MONITOR BLOOD SUGAR. CHANGE SENSOR EVERY 14 DAYS 7 each 3   DULoxetine (CYMBALTA) 30 MG capsule Take 1 capsule (30 mg total) by mouth daily. 30 capsule 5   Eplontersen Sodium (WAINUA) 45 MG/0.8ML SOAJ Inject 45 mg into the skin every 30 (thirty) days. 2.4 mL 3   FARXIGA 10 MG TABS tablet Take 1 tablet (10 mg total) by mouth daily. 90 tablet 3   Fluocinolone Acetonide 0.01 % OIL instill one drop in ears every 2-3 DAYS AS NEEDED FOR pruritis 20 mL 1   gabapentin (NEURONTIN) 600 MG tablet TAKE ONE TABLET BY MOUTH AT BREAKFAST AND AT BEDTIME 60 tablet 3   glucose blood (ACCU-CHEK GUIDE) test strip Use to test blood sugar up to 2 times daily as needed 100 each 3   insulin lispro (HUMALOG KWIKPEN) 100 UNIT/ML KwikPen Inject 4-8 Units into the skin daily as needed. Sliding scale: Max daily 30 units 30 mL 1   Insulin Pen Needle 32G X  4 MM MISC 1 Device by Does not apply route in the morning, at noon, in the evening, and at bedtime. 400 each 3   isosorbide mononitrate (IMDUR) 30 MG 24 hr tablet Take 1 tablet (30 mg total) by mouth daily. 30 tablet 7   ketoconazole (NIZORAL) 2 % shampoo Apply 1 application. topically 2 (two) times a week. (Patient taking differently: Apply 1 application  topically daily as needed for irritation.) 120 mL 2   Lancets Misc. (ACCU-CHEK FASTCLIX LANCET) KIT Use to test blood sugar up to 2 times daily as needed 1 kit 1   levothyroxine (SYNTHROID) 150 MCG tablet TAKE ONE TABLET BY MOUTH BEFORE BREAKFAST 90 tablet 3   magnesium oxide (MAG-OX) 400 MG tablet TAKE ONE TABLET BY MOUTH EACH MORNING 90 tablet 2  metoprolol succinate (TOPROL-XL) 25 MG 24 hr tablet TAKE ONE TABLET BY MOUTH ONCE DAILY with OR immedaitely following A meal 90 tablet 3   Multiple Vitamin (MULTIVITAMIN) tablet Take 1 tablet by mouth daily. K FREE DAILY once daily (MVI with no vitamin K)     nitroGLYCERIN (NITROSTAT) 0.4 MG SL tablet Dissolve 1 tab under tongue as needed for chest pain. May repeat every 5 minutes x 2 doses. If no relief call 9-1-1. 75 tablet 2   pantoprazole (PROTONIX) 20 MG tablet TAKE ONE TABLET BY MOUTH DAILY 60 tablet 2   Polyethylene Glycol 3350 (MIRALAX PO) Take 1 Package by mouth daily as needed (constipation).     potassium chloride SA (KLOR-CON M) 20 MEQ tablet Take 2 tablets (40 mEq total) by mouth daily. 60 tablet 6   Propylene Glycol (SYSTANE BALANCE OP) Place 1 drop into both eyes daily as needed (for dry eyes).     sacubitril-valsartan (ENTRESTO) 24-26 MG Take 1 tablet by mouth 2 (two) times daily. 90 tablet 3   Tafamidis Meglumine, Cardiac, (VYNDAQEL) 20 MG CAPS Take 4 capsules (80 mg total) by mouth daily. 120 capsule 11   tirzepatide (MOUNJARO) 7.5 MG/0.5ML Pen Inject 7.5 mg into the skin once a week. (Patient taking differently: Inject 7.5 mg into the skin once a week. Tuesdays) 6 mL 3   torsemide  (DEMADEX) 20 MG tablet Take 4 tablets (80 mg total) by mouth daily. May take an additional 20 mg as needed for swelling 120 tablet 6   trolamine salicylate (ASPERCREME) 10 % cream Apply 1 application. topically as needed for muscle pain.     warfarin (COUMADIN) 2.5 MG tablet TAKE 1/2 TO 1 (ONE-HALF TO ONE) TABLET BY MOUTH ONCE DAILY AS DIRECTED BY THE COUMADIN CLINIC 90 tablet 0   No facility-administered medications prior to visit.      Review of Systems  Review of Systems  Constitutional: Negative.   HENT: Negative.         Nasal burning   Respiratory: Negative.    Cardiovascular: Negative.    Physical Exam  BP 113/77 (BP Location: Left Arm, Patient Position: Sitting, Cuff Size: Large)   Pulse 88   Temp 98.3 F (36.8 C) (Oral)   Ht 5\' 2"  (1.575 m)   Wt 226 lb 3.2 oz (102.6 kg)   SpO2 100%   BMI 41.37 kg/m  Physical Exam Constitutional:      General: She is not in acute distress.    Appearance: Normal appearance. She is not ill-appearing.  HENT:     Head: Normocephalic and atraumatic.  Cardiovascular:     Rate and Rhythm: Normal rate and regular rhythm.  Pulmonary:     Effort: Pulmonary effort is normal.     Breath sounds: Normal breath sounds.  Neurological:     General: No focal deficit present.     Mental Status: She is alert and oriented to person, place, and time. Mental status is at baseline.  Psychiatric:        Mood and Affect: Mood normal.        Behavior: Behavior normal.        Thought Content: Thought content normal.        Judgment: Judgment normal.      Lab Results:  CBC    Component Value Date/Time   WBC 6.0 10/26/2023 1951   RBC 4.24 10/26/2023 1951   HGB 12.9 10/26/2023 1951   HGB 12.1 08/03/2020 0913   HCT 39.3 10/26/2023  1951   HCT 36.8 08/03/2020 0913   PLT 254 10/26/2023 1951   PLT 362 08/03/2020 0913   MCV 92.7 10/26/2023 1951   MCV 86 08/03/2020 0913   MCH 30.4 10/26/2023 1951   MCHC 32.8 10/26/2023 1951   RDW 15.0 10/26/2023  1951   RDW 13.1 08/03/2020 0913   LYMPHSABS 1.3 02/05/2023 1839   MONOABS 0.6 02/05/2023 1839   EOSABS 0.1 02/05/2023 1839   BASOSABS 0.0 02/05/2023 1839    BMET    Component Value Date/Time   NA 137 10/26/2023 1951   NA 139 02/10/2023 1007   K 4.6 10/26/2023 1951   CL 95 (L) 10/26/2023 1951   CO2 31 10/26/2023 1951   GLUCOSE 102 (H) 10/26/2023 1951   BUN 38 (H) 10/26/2023 1951   BUN 40 (H) 02/10/2023 1007   CREATININE 1.99 (H) 10/26/2023 1951   CALCIUM 9.3 10/26/2023 1951   GFRNONAA 25 (L) 10/26/2023 1951   GFRAA 26 10/20/2021 0000    BNP    Component Value Date/Time   BNP 766.6 (H) 10/26/2023 1951    ProBNP No results found for: "PROBNP"  Imaging: No results found.   Assessment & Plan:   No problem-specific Assessment & Plan notes found for this encounter.   Assessment and Plan    Obstructive Sleep Apnea Patient is using CPAP 80% of the time but not always for 4 hours due to nasal burning. Discussed the importance of consistent use for at least 4 hours per night. The apnea is controlled with less than 5 events per hour. -Adjust humidification settings and pressure settings on CPAP machine. -Consider use of nasal gel for comfort. -Check back in 6 months or sooner if issues persist.  Lung Nodules Previous 6mm right middle lobe nodule resolved, likely due to inflammation or infection. Stable 2mm pulmonary nodules deemed benign. -No further follow-up or CT scans needed for lung nodules.        Glenford Bayley, NP 11/28/2023

## 2023-12-04 ENCOUNTER — Telehealth: Payer: Self-pay | Admitting: Primary Care

## 2023-12-04 ENCOUNTER — Telehealth: Payer: Self-pay | Admitting: *Deleted

## 2023-12-04 ENCOUNTER — Telehealth: Payer: Self-pay | Admitting: Cardiovascular Disease

## 2023-12-04 ENCOUNTER — Other Ambulatory Visit: Payer: Self-pay | Admitting: *Deleted

## 2023-12-04 DIAGNOSIS — G4733 Obstructive sleep apnea (adult) (pediatric): Secondary | ICD-10-CM

## 2023-12-04 MED ORDER — WAINUA 45 MG/0.8ML ~~LOC~~ SOAJ: 45.0000 mg | SUBCUTANEOUS | 1 refills | Status: AC

## 2023-12-04 NOTE — Telephone Encounter (Signed)
Patient advised that medication refill for Eplontersen Sodium [facility]) per request was sent to [facility], [place]

## 2023-12-04 NOTE — Telephone Encounter (Signed)
Pt states still has burning in her nose. States doesn't think the pressure change helped. Would like Beth to call her

## 2023-12-04 NOTE — Telephone Encounter (Signed)
*  STAT* If patient is at the pharmacy, call can be transferred to refill team.   1. Which medications need to be refilled? (please list name of each medication and dose if known)   Eplontersen Sodium (WAINUA) 45 MG/0.8ML SOAJ   2. Would you like to learn more about the convenience, safety, & potential cost savings by using the [facility]?   3. Are you open to using the [facility] (Type [facility]. ).  4. Which pharmacy/location (including street and city if local pharmacy) is medication to be sent to?  [facility], [place] - [address]   5. Do they need a 30 day or 90 day supply?   30 day  Patient stated she is completely out of this medication and she was due to take a shot on 2/16. Patient wants a call back to confirm medication has been sent.

## 2023-12-05 ENCOUNTER — Telehealth: Payer: Self-pay | Admitting: *Deleted

## 2023-12-05 NOTE — Telephone Encounter (Signed)
I called and spoke with pt. Pt states her pressure setting for CPAP was changed. Pt complains there is burning in her nose. Pt was unable to sleep last night. Pt would like something done to help this. Please advise. Pt states she not completley sure if it is the pressure but it is something. Pressure settings were changed to 9 cm h20, humidification at 5

## 2023-12-05 NOTE — Telephone Encounter (Signed)
Her download actually looks good, AHI 2.5 on current pressure 9cm h20 I will lower her setting to 8cm h20 and increase humidification to level 6 She should try using Ayr nasal gel/spray for moisture

## 2023-12-05 NOTE — Telephone Encounter (Signed)
Pt called and stated she needs to cancel appt for 12/07/23; she states she will be able to come on 12/12/23. Advised we do not have any availability that day but 12/14/23 is available. She states she will call back tomorrow by 10am to let us know if she needs to change appt.

## 2023-12-05 NOTE — Telephone Encounter (Signed)
I called pt and notified her of Beth's message. Pt verbalized understanding. NFN

## 2023-12-06 ENCOUNTER — Telehealth: Payer: Self-pay | Admitting: *Deleted

## 2023-12-06 NOTE — Telephone Encounter (Signed)
Pt called to inquire if she could cancel tomorrow's appointment on 12/07/23 and move to 12/12/23 with the pending weather. Advised we do not have any appts on Tuesday 12/12/23 but we do have appts for 12/14/23. She states she will continue to call back to check and will keep the 12/07/23 at this time.

## 2023-12-07 ENCOUNTER — Ambulatory Visit: Payer: Medicare Other

## 2023-12-08 ENCOUNTER — Other Ambulatory Visit: Payer: Self-pay | Admitting: Cardiovascular Disease

## 2023-12-08 NOTE — Telephone Encounter (Signed)
 This is a CHF pt

## 2023-12-13 ENCOUNTER — Other Ambulatory Visit (HOSPITAL_COMMUNITY): Payer: Self-pay

## 2023-12-14 ENCOUNTER — Other Ambulatory Visit: Payer: Self-pay

## 2023-12-14 ENCOUNTER — Ambulatory Visit: Payer: Medicare Other | Attending: Cardiology

## 2023-12-14 DIAGNOSIS — I4891 Unspecified atrial fibrillation: Secondary | ICD-10-CM

## 2023-12-14 DIAGNOSIS — Z7901 Long term (current) use of anticoagulants: Secondary | ICD-10-CM | POA: Diagnosis not present

## 2023-12-14 DIAGNOSIS — D6869 Other thrombophilia: Secondary | ICD-10-CM

## 2023-12-14 DIAGNOSIS — I4819 Other persistent atrial fibrillation: Secondary | ICD-10-CM

## 2023-12-14 LAB — POCT INR: INR: 2.9 (ref 2.0–3.0)

## 2023-12-14 MED ORDER — FREESTYLE LIBRE 2 SENSOR MISC: 3 refills | Status: AC

## 2023-12-14 NOTE — Patient Instructions (Signed)
 Description   (USE GREEN TABLET ONLY) Continue taking warfarin 1 tablet (2.5mg ) daily except for 1/2 tablet (1.25mg ) on Thursdays.  Recheck INR in 6 weeks. Anticoagulation Clinic [phone]

## 2023-12-21 ENCOUNTER — Ambulatory Visit: Payer: Self-pay | Admitting: Family Medicine

## 2023-12-21 NOTE — Telephone Encounter (Signed)
 Copied from CRM [id number]. Topic: Clinical - Medical Advice >> Dec 21, 2023  8:46 AM Elizebeth Brooking wrote: Reason for CRM: Patient called in wanting to know if she can be called in something for her cough, if so would like it sent to  [facility] [place], [place] - [address] [address] [place] [address] Phone: [phone] Fax: [phone]  Additional Notes: This Triage RN attempted to contact the patient at this time for triage, no answer at this time. Left a voicemail.

## 2023-12-21 NOTE — Telephone Encounter (Signed)
  Chief Complaint: dry cough requesting medication  Symptoms: dry cough , coughing continuous at  times. Headache yesterday  Frequency: 1 week  Pertinent Negatives: Patient denies chest pain no difficulty breathing no fever no sore throat  Disposition: [] ED /[] Urgent Care (no appt availability in office) / [x] Appointment(In office/virtual)/ []  [facility]/ [] Home Care/ [] Refused Recommended Disposition /[] [facility] [facility]/ []  Follow-up with PCP Additional Notes:   My chart VV scheduled for t3/7/25. Patient requesting medication for dry cough.       Reason for Disposition  [1] Continuous (nonstop) coughing interferes with work or school AND [2] no improvement using cough treatment per Care Advice  Answer Assessment - Initial Assessment Questions 1. ONSET: "When did the cough begin?"      1 week ago  2. SEVERITY: "How bad is the cough today?"      Dry cough  3. SPUTUM: "Describe the color of your sputum" (none, dry cough; clear, white, yellow, green)     None  4. HEMOPTYSIS: "Are you coughing up any blood?" If so ask: "How much?" (flecks, streaks, tablespoons, etc.)     No  5. DIFFICULTY BREATHING: "Are you having difficulty breathing?" If Yes, ask: "How bad is it?" (e.g., mild, moderate, severe)    - MILD: No SOB at rest, mild SOB with walking, speaks normally in sentences, can lie down, no retractions, pulse < 100.    - MODERATE: SOB at rest, SOB with minimal exertion and prefers to sit, cannot lie down flat, speaks in phrases, mild retractions, audible wheezing, pulse 100-120.    - SEVERE: Very SOB at rest, speaks in single words, struggling to breathe, sitting hunched forward, retractions, pulse > 120      None  6. FEVER: "Do you have a fever?" If Yes, ask: "What is your temperature, how was it measured, and when did it start?"     na 7. CARDIAC HISTORY: "Do you have any history of heart disease?" (e.g., heart attack, congestive heart failure)      Hx heart  issues 8. LUNG HISTORY: "Do you have any history of lung disease?"  (e.g., pulmonary embolus, asthma, emphysema)     Hx wears CPAP 9. PE RISK FACTORS: "Do you have a history of blood clots?" (or: recent major surgery, recent prolonged travel, bedridden)     na 10. OTHER SYMPTOMS: "Do you have any other symptoms?" (e.g., runny nose, wheezing, chest pain)       Cough dry  11. PREGNANCY: "Is there any chance you are pregnant?" "When was your last menstrual period?"       na 12. TRAVEL: "Have you traveled out of the country in the last month?" (e.g., travel history, exposures)       na  Protocols used: Cough - Acute Non-Productive-A-AH

## 2023-12-22 ENCOUNTER — Telehealth: Admitting: Family Medicine

## 2023-12-22 ENCOUNTER — Other Ambulatory Visit: Payer: Self-pay

## 2023-12-22 ENCOUNTER — Encounter: Payer: Self-pay | Admitting: Family Medicine

## 2023-12-22 VITALS — Ht 62.0 in

## 2023-12-22 DIAGNOSIS — R051 Acute cough: Secondary | ICD-10-CM

## 2023-12-22 MED ORDER — BENZONATATE 100 MG PO CAPS: 100.0000 mg | ORAL_CAPSULE | Freq: Two times a day (BID) | ORAL | 0 refills | Status: AC | PRN

## 2023-12-22 NOTE — Progress Notes (Signed)
 Virtual Visit via Video Note I connected with Regina [name] on 12/22/2023 by a video enabled telemedicine application and verified that I am speaking with the correct person using two identifiers. Location patient: home Location provider:work office Persons participating in the virtual visit: patient, provider, scribe  I discussed the limitations of evaluation and management by telemedicine and the availability of in person appointments. The patient expressed understanding and agreed to proceed.  Chief Complaint  Patient presents with   Cough    Dry cough x a week    HPI: Regina [name] is an 83 y.o. female with a PMHx significant for HTN, HLD, DM II, hypothyroidism, CKD IV, CAD, HFrEF, cardiac amyloidosis, PAF, GERD, peripheral neuropathy, and OA, who is being seen on video today for cough.   Patient complains of constant cough for a week. Also endorses clear rhinorrhea today.   She says the cough is constant throughout the day and night and has been interfering with her sleep. She has not identified exacerbating or alleviating factors.  Cough This is a new problem. The current episode started in the past 7 days. The problem has been unchanged. The cough is Non-productive. Pertinent negatives include no chest pain, ear congestion, ear pain, headaches, hemoptysis, myalgias, rash, sore throat, sweats or weight loss. Nothing aggravates the symptoms. She has tried nothing for the symptoms. There is no history of environmental allergies.   She has not done a COVID or flu test at home. She has not taken anything for the cough yet.  Problem seems to be stable. Pertinent negatives include nasal congestion, postnasal drainage, fever, chills, heartburn, CP,SOB, wheezing, abdominal pain, nausea, vomiting, changes in bowel habits, or urinary symptoms. No known sick contacts.   She had a chest X-ray on 10/26/2023 with the following impression:  Mild central vascular congestion without  edema.   ROS: See pertinent positives and negatives per HPI.  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 01/2015 Cardiolite: + inf wall ischemia, EF 56%;  b. 01/2015 Cath: LM nl, LAD 50m, LCX min irregs, RCA dominant, 50-64m.   Sleep apnea    Swallowing difficulty    Past Surgical History:  Procedure Laterality Date   ABDOMINAL HYSTERECTOMY     BUBBLE STUDY  07/01/2020   Procedure: BUBBLE STUDY;  Surgeon: Parke Poisson, MD;  Location: [facility] ENDOSCOPY;  Service: Cardiovascular;;   BUBBLE STUDY  08/05/2020   Procedure: BUBBLE STUDY;  Surgeon: Sande Rives, MD;  Location: [facility] ENDOSCOPY;  Service: Cardiovascular;;  done with definity    CARDIAC CATHETERIZATION     ESOPHAGOGASTRODUODENOSCOPY     a. 01/2015 EGD: patent esophagus.   ESOPHAGOGASTRODUODENOSCOPY N/A 01/20/2015   Procedure: ESOPHAGOGASTRODUODENOSCOPY (EGD);  Surgeon: Jeani Hawking, MD;  Location: [facility] ENDOSCOPY;  Service: Endoscopy;  Laterality: N/A;   HAND SURGERY     JOINT REPLACEMENT     reports history bilateral TKA and right TSA   LEFT HEART CATHETERIZATION WITH CORONARY ANGIOGRAM N/A 01/19/2015   RIGHT HEART CATH N/A 12/27/2021   Procedure: RIGHT  HEART CATH;  Surgeon: Runell Gess, MD;  Location: [facility] INVASIVE CV LAB;  Service: Cardiovascular;  Laterality: N/A;   TEE WITHOUT CARDIOVERSION  05/04/2020   TEE WITHOUT CARDIOVERSION N/A 05/05/2020   Procedure: TRANSESOPHAGEAL ECHOCARDIOGRAM (TEE);  Surgeon: Chrystie Nose, MD;  Location: [facility] ENDOSCOPY;  Service: Cardiovascular;  Laterality: N/A;   TEE WITHOUT CARDIOVERSION N/A 07/01/2020    Procedure: TRANSESOPHAGEAL ECHOCARDIOGRAM (TEE);  Surgeon: Parke Poisson, MD;  Location: [facility] ENDOSCOPY;  Service: Cardiovascular;  Laterality: N/A;   TEE WITHOUT CARDIOVERSION N/A 08/05/2020   Procedure: TRANSESOPHAGEAL ECHOCARDIOGRAM (TEE);  Surgeon: Sande Rives, MD;  Location: [facility] ENDOSCOPY;  Service: Cardiovascular;  Laterality: N/A;   THYROIDECTOMY      Family History  Problem Relation Age of Onset   Heart disease Mother    Hypertension Mother    Cancer Father    Alcoholism Father     Social History   Socioeconomic History   Marital status: Widowed    Spouse name: Not on file   Number of children: Not on file   Years of education: Not on file   Highest education level: Associate degree: academic program  Occupational History   Occupation: Retired  Tobacco Use   Smoking status: Never   Smokeless tobacco: Never  Vaping Use   Vaping status: Never Used  Substance and Sexual Activity   Alcohol use: No   Drug use: Never   Sexual activity: Not Currently  Other Topics Concern   Not on file  Social History Narrative   Lives with daughter   Social Drivers of Health   Financial Resource Strain: Low Risk  (07/07/2023)   Overall Financial Resource Strain (CARDIA)    Difficulty of Paying Living Expenses: Not hard at all  Food Insecurity: Unknown (07/07/2023)   Hunger Vital Sign    Worried About Running Out of Food in the Last Year: Patient declined    Ran Out of Food in the Last Year: Never true  Transportation Needs: No Transportation Needs (07/07/2023)   PRAPARE - Administrator, Civil Service (Medical): No    Lack of Transportation (Non-Medical): No  Physical Activity: Inactive (07/07/2023)   Exercise Vital Sign    Days of Exercise per Week: 0 days    Minutes of Exercise per Session: 0 min  Stress: No Stress Concern Present (07/07/2023)   [employer] of Occupational Health - Occupational Stress Questionnaire    Feeling of Stress : Only a  little  Social Connections: Moderately Integrated (07/07/2023)   Social Connection and Isolation Panel [NHANES]    Frequency of Communication with Friends and Family: More than three times a week    Frequency of Social Gatherings with Friends and Family: Once a week    Attends Religious Services: More than 4 times per year    Active Member of [employer] or Organizations: Yes    Attends Banker Meetings: More than 4 times per year    Marital Status: Widowed  Intimate Partner Violence: Not At Risk (02/20/2023)   Humiliation, Afraid, Rape, and Kick questionnaire    Fear of Current or Ex-Partner: No    Emotionally Abused: No    Physically Abused: No    Sexually Abused: No     Current Outpatient Medications:    acetaminophen (TYLENOL) 500 MG tablet, Take 1,000 mg by mouth every 6 (six) hours as needed for mild pain., Disp: , Rfl:    Alcohol Swabs (B-D SINGLE USE SWABS REGULAR) PADS, Use to  test blood sugar up to 3 times daily, Disp: 100 each, Rfl: 3   allopurinol (ZYLOPRIM) 100 MG tablet, Take 1 tablet (100 mg total) by mouth every morning., Disp: 90 tablet, Rfl: 1   atorvastatin (LIPITOR) 20 MG tablet, TAKE ONE TABLET BY MOUTH EVERYDAY AT BEDTIME, Disp: 90 tablet, Rfl: 3   benzonatate (TESSALON) 100 MG capsule, Take 1 capsule (100 mg total) by mouth 2 (two) times daily as needed for up to 10 days., Disp: 20 capsule, Rfl: 0   Blood Glucose Monitoring Suppl (ACCU-CHEK GUIDE) w/Device KIT, Use to check blood sugars, Disp: 1 kit, Rfl: 0   cetirizine (ZYRTEC) 10 MG chewable tablet, Chew 10 mg by mouth daily., Disp: , Rfl:    Continuous Blood Gluc Receiver (FREESTYLE LIBRE 2 READER) DEVI, Use as instructed to check blood sugar., Disp: 1 each, Rfl: 0   Continuous Glucose Sensor (FREESTYLE LIBRE 2 SENSOR) MISC, USE TO MONITOR BLOOD SUGAR. CHANGE SENSOR EVERY 14 DAYS, Disp: 6 each, Rfl: 3   DULoxetine (CYMBALTA) 30 MG capsule, Take 1 capsule (30 mg total) by mouth daily., Disp: 30 capsule, Rfl:  5   Eplontersen Sodium (WAINUA) 45 MG/0.8ML SOAJ, Inject 45 mg into the skin every 30 (thirty) days., Disp: 2.4 mL, Rfl: 1   FARXIGA 10 MG TABS tablet, Take 1 tablet (10 mg total) by mouth daily., Disp: 90 tablet, Rfl: 3   Fluocinolone Acetonide 0.01 % OIL, instill one drop in ears every 2-3 DAYS AS NEEDED FOR pruritis, Disp: 20 mL, Rfl: 1   gabapentin (NEURONTIN) 600 MG tablet, TAKE ONE TABLET BY MOUTH AT BREAKFAST AND AT BEDTIME, Disp: 60 tablet, Rfl: 3   glucose blood (ACCU-CHEK GUIDE) test strip, Use to test blood sugar up to 2 times daily as needed, Disp: 100 each, Rfl: 3   insulin lispro (HUMALOG KWIKPEN) 100 UNIT/ML KwikPen, Inject 4-8 Units into the skin daily as needed. Sliding scale: Max daily 30 units, Disp: 30 mL, Rfl: 1   Insulin Pen Needle 32G X 4 MM MISC, 1 Device by Does not apply route in the morning, at noon, in the evening, and at bedtime., Disp: 400 each, Rfl: 3   isosorbide mononitrate (IMDUR) 30 MG 24 hr tablet, Take 1 tablet (30 mg total) by mouth daily., Disp: 30 tablet, Rfl: 7   ketoconazole (NIZORAL) 2 % shampoo, Apply 1 application. topically 2 (two) times a week. (Patient taking differently: Apply 1 application  topically daily as needed for irritation.), Disp: 120 mL, Rfl: 2   Lancets Misc. (ACCU-CHEK FASTCLIX LANCET) KIT, Use to test blood sugar up to 2 times daily as needed, Disp: 1 kit, Rfl: 1   levothyroxine (SYNTHROID) 150 MCG tablet, TAKE ONE TABLET BY MOUTH BEFORE BREAKFAST, Disp: 90 tablet, Rfl: 3   magnesium oxide (MAG-OX) 400 MG tablet, TAKE ONE TABLET BY MOUTH EACH MORNING, Disp: 90 tablet, Rfl: 2   metoprolol succinate (TOPROL-XL) 25 MG 24 hr tablet, TAKE ONE TABLET BY MOUTH ONCE DAILY with OR immedaitely following A meal, Disp: 90 tablet, Rfl: 3   Multiple Vitamin (MULTIVITAMIN) tablet, Take 1 tablet by mouth daily. K FREE DAILY once daily (MVI with no vitamin K), Disp: , Rfl:    nitroGLYCERIN (NITROSTAT) 0.4 MG SL tablet, DISSOLVE ONE TABLET UNDER THE TONGUE  AS NEEDED FOR CHST PAIN EVERY 5 MINUTES UP TO THREE TIMES. IF NO REFILL CALL 911., Disp: 75 tablet, Rfl: 2   pantoprazole (PROTONIX) 20 MG tablet, TAKE ONE TABLET BY MOUTH DAILY, Disp: 60 tablet,  Rfl: 2   Polyethylene Glycol 3350 (MIRALAX PO), Take 1 Package by mouth daily as needed (constipation)., Disp: , Rfl:    potassium chloride SA (KLOR-CON M) 20 MEQ tablet, Take 2 tablets (40 mEq total) by mouth daily., Disp: 60 tablet, Rfl: 6   Propylene Glycol (SYSTANE BALANCE OP), Place 1 drop into both eyes daily as needed (for dry eyes)., Disp: , Rfl:    sacubitril-valsartan (ENTRESTO) 24-26 MG, Take 1 tablet by mouth 2 (two) times daily., Disp: 90 tablet, Rfl: 3   Tafamidis Meglumine, Cardiac, (VYNDAQEL) 20 MG CAPS, Take 4 capsules (80 mg total) by mouth daily., Disp: 120 capsule, Rfl: 11   tirzepatide (MOUNJARO) 7.5 MG/0.5ML Pen, Inject 7.5 mg into the skin once a week. (Patient taking differently: Inject 7.5 mg into the skin once a week. Tuesdays), Disp: 6 mL, Rfl: 3   torsemide (DEMADEX) 20 MG tablet, Take 4 tablets (80 mg total) by mouth daily. May take an additional 20 mg as needed for swelling, Disp: 120 tablet, Rfl: 6   trolamine salicylate (ASPERCREME) 10 % cream, Apply 1 application. topically as needed for muscle pain., Disp: , Rfl:    warfarin (COUMADIN) 2.5 MG tablet, TAKE 1/2 TO 1 (ONE-HALF TO ONE) TABLET BY MOUTH ONCE DAILY AS DIRECTED BY THE COUMADIN CLINIC, Disp: 90 tablet, Rfl: 0  EXAM:  VITALS per patient if applicable:Ht 5\' 2"  (1.575 m)   BMI 41.37 kg/m   GENERAL: alert, oriented, appears well and in no acute distress  HEENT: atraumatic, conjunctiva clear, no obvious abnormalities on inspection of external nose and ears Rhinorrhea.  NECK: normal movements of the head and neck  LUNGS: on inspection no signs of respiratory distress, breathing rate appears normal, no obvious gross SOB, gasping or wheezing  CV: no obvious cyanosis  MS: moves all visible extremities without  noticeable abnormality  PSYCH/NEURO: pleasant and cooperative, no obvious depression or anxiety, speech and thought processing grossly intact  ASSESSMENT AND PLAN:  Discussed the following assessment and plan:  Acute cough - Plan: benzonatate (TESSALON) 100 MG capsule We discussed possible etiologies. She reports the problem has been going on for about a week, so most likely caused by an acute viral illness. Others to be considered allergies, some of her chronic medical problems and medications could also be contributing factors. For now I do not think imaging is needed. Instructed to monitor for new symptoms or signs of complications. Monitor temperature. Continue adequate hydration. If cough becomes more productive, she could try plain Mucinex. For now I recommend symptomatic treatment with benzonatate. Instructed about warning signs.   We discussed possible serious and likely etiologies, options for evaluation and workup, limitations of telemedicine visit vs in person visit, treatment, treatment risks and precautions. The patient was advised to call back or seek an in-person evaluation if the symptoms worsen or if the condition fails to improve as anticipated. I discussed the assessment and treatment plan with the patient. The patient was provided an opportunity to ask questions and all were answered. The patient agreed with the plan and demonstrated an understanding of the instructions.  Return if symptoms worsen or fail to improve, for keep next appointment.  I, Rolla Etienne Wierda, acting as a scribe for Jennika Ringgold [place], MD., have documented all relevant documentation on the behalf of Markiah Janeway [place], MD, as directed by  Wilmarie Sparlin [place], MD while in the presence of Morelia Cassells [place], MD.   I, Raymie Trani [place], MD, have reviewed all documentation for this visit. The documentation on  12/22/23 for the exam, diagnosis, procedures, and orders are all accurate and complete.  Lavone Weisel [place], MD

## 2023-12-22 NOTE — Progress Notes (Signed)
 Specialty Pharmacy Refill Coordination Note  Regina [name] is a 83 y.o. female contacted today regarding refills of specialty medication(s) Tafamidis Meglumine (Cardiac) [name])   Patient requested Delivery   Delivery date: 12/26/23   Verified address: [address] [place], [address]   Medication will be filled on 03.10.25.

## 2023-12-25 ENCOUNTER — Other Ambulatory Visit: Payer: Self-pay

## 2024-01-02 ENCOUNTER — Other Ambulatory Visit: Payer: Self-pay | Admitting: Family Medicine

## 2024-01-09 ENCOUNTER — Ambulatory Visit (INDEPENDENT_AMBULATORY_CARE_PROVIDER_SITE_OTHER): Payer: Medicare Other | Admitting: Family Medicine

## 2024-01-09 ENCOUNTER — Encounter: Payer: Self-pay | Admitting: Family Medicine

## 2024-01-09 VITALS — BP 116/70 | HR 85 | Temp 96.9°F | Resp 16 | Ht 62.0 in | Wt 226.8 lb

## 2024-01-09 DIAGNOSIS — G63 Polyneuropathy in diseases classified elsewhere: Secondary | ICD-10-CM

## 2024-01-09 DIAGNOSIS — R200 Anesthesia of skin: Secondary | ICD-10-CM

## 2024-01-09 DIAGNOSIS — R519 Headache, unspecified: Secondary | ICD-10-CM | POA: Diagnosis not present

## 2024-01-09 DIAGNOSIS — G259 Extrapyramidal and movement disorder, unspecified: Secondary | ICD-10-CM | POA: Diagnosis not present

## 2024-01-09 DIAGNOSIS — M791 Myalgia, unspecified site: Secondary | ICD-10-CM

## 2024-01-09 DIAGNOSIS — L281 Prurigo nodularis: Secondary | ICD-10-CM

## 2024-01-09 DIAGNOSIS — L299 Pruritus, unspecified: Secondary | ICD-10-CM | POA: Diagnosis not present

## 2024-01-09 NOTE — Patient Instructions (Addendum)
 A few things to remember from today's visit:  Scalp pruritus - Plan: Ambulatory referral to Dermatology  Movement disorder - Plan: Ambulatory referral to Neurology  Polyneuropathy associated with underlying disease (HCC)  Numbness in both hands - Plan: Ambulatory referral to Neurology  Headache, unspecified headache type - Plan: Ambulatory referral to Neurology  Start weaning off Duloxetine, take it every other day for 10 days then every 3rd day for 10 days and every 4th day for 8 days and stop. Rest unchanged. Low impact stretching exercises.  If you need refills for medications you take chronically, please call your pharmacy. Do not use My Chart to request refills or for acute issues that need immediate attention. If you send a my chart message, it may take a few days to be addressed, specially if I am not in the office.  Please be sure medication list is accurate. If a new problem present, please set up appointment sooner than planned today.

## 2024-01-09 NOTE — Progress Notes (Signed)
 HPI: Regina [name] is a 83 y.o. female with a PMHx significant for HTN, HLD, DM II, hypothyroidism, CKD IV, CAD, HFrEF, cardiac amyloidosis, PAF, GERD, peripheral neuropathy, and OA, who is here today with her daughter for chronic disease management.  Last seen on video on 12/22/2023 for cough. Last seen in office on 10/30/2023  Cough:  Cough reported last visit has resolved.  Joint pain:  Currently on duloxetine 30 mg daily. She says she has been sore all over her body lately, and is unsure if the medicine is helping.  She has some decreased ROM in her shoulders. Not interested in seeing physical therapy.  No hx of trauma or joint edema/erythema.  Dermatologic problems:  Patient also complains of a skin lesion on her left upper arm. She says the lesion is sore and itchy at present.   She further mentions itching in her ears and on her scalp.  Nizoral shampoo has not helped with scalp pruritus.No skin lesions on scalp.  Tremors:  She also complains of worsening tremors in her arms and hands. She has had them for a while but they have become more bothersome lately, especially in the morning. Not having problems with eating, drinking, or writing.  She has not identified exacerbating or alleviating factors.  Also endorses weakness  and numbness in her hands for a long time. She has difficulty picking things up.  She uses Aspercreme for her hands, which helps.  Peripheral neuropathy: Currently on gabapentin 600 mg twice daily.   Hypertension and HFrEF:  Currently on metoprolol succinate 25 mg daily and sacubitril-valsartan 24-26 mg twice daily.  Negative for visual changes, exertional chest pain, dyspnea,  focal weakness, or edema. Echo 01/2023 LVEF 35-40%.  Lab Results  Component Value Date   CREATININE 1.99 (H) 10/26/2023   BUN 38 (H) 10/26/2023   NA 137 10/26/2023   K 4.6 10/26/2023   CL 95 (L) 10/26/2023   CO2 31 10/26/2023   Headaches:  She mentions she still has  occasional sharp pains in her head that last for a few seconds, usually from occipital area to parietal and temporal. She had an MRI done to check on this problem in 08/2023. She has seen neurologist in the past.  MRI Brain from 09/07/2023 Impression:  1.) No evidence of an acute intracranial abnormality.  2.) Parenchymal atrophy, chronic small vessel ischemic disease and chronic infarcts as described.  3.) Nonspecific punctuate chronic microhemorrhages, one with the right cerebellar hemisphere and the other at the anterior left insula.   Lab Results  Component Value Date   CRP 2.3 07/07/2023   Lab Results  Component Value Date   ESRSEDRATE 127 Repeated and verified X2. (H) 07/07/2023   Review of Systems  Constitutional:  Positive for fatigue. Negative for activity change, appetite change, chills and fever.  HENT:  Negative for mouth sores, sore throat and trouble swallowing.   Endocrine: Negative for cold intolerance, heat intolerance, polydipsia, polyphagia and polyuria.  Genitourinary:  Negative for decreased urine volume, dysuria and hematuria.  Skin:  Negative for rash.  Neurological:  Negative for syncope, facial asymmetry and weakness.  Psychiatric/Behavioral:  Negative for confusion and hallucinations.   See other pertinent positives and negatives in HPI.  Current Outpatient Medications on File Prior to Visit  Medication Sig Dispense Refill   acetaminophen (TYLENOL) 500 MG tablet Take 1,000 mg by mouth every 6 (six) hours as needed for mild pain.     Alcohol Swabs (B-D SINGLE USE SWABS  REGULAR) PADS Use to test blood sugar up to 3 times daily 100 each 3   allopurinol (ZYLOPRIM) 100 MG tablet Take 1 tablet (100 mg total) by mouth every morning. 90 tablet 1   atorvastatin (LIPITOR) 20 MG tablet TAKE ONE TABLET BY MOUTH EVERYDAY AT BEDTIME 90 tablet 3   Blood Glucose Monitoring Suppl (ACCU-CHEK GUIDE) w/Device KIT Use to check blood sugars 1 kit 0   cetirizine (ZYRTEC) 10 MG  chewable tablet Chew 10 mg by mouth daily.     Continuous Blood Gluc Receiver (FREESTYLE LIBRE 2 READER) DEVI Use as instructed to check blood sugar. 1 each 0   Continuous Glucose Sensor (FREESTYLE LIBRE 2 SENSOR) MISC USE TO MONITOR BLOOD SUGAR. CHANGE SENSOR EVERY 14 DAYS 6 each 3   DULoxetine (CYMBALTA) 30 MG capsule Take 1 capsule (30 mg total) by mouth daily. 30 capsule 5   Eplontersen Sodium (WAINUA) 45 MG/0.8ML SOAJ Inject 45 mg into the skin every 30 (thirty) days. 2.4 mL 1   FARXIGA 10 MG TABS tablet Take 1 tablet (10 mg total) by mouth daily. 90 tablet 3   Fluocinolone Acetonide 0.01 % OIL instill one drop in ears every 2-3 DAYS AS NEEDED FOR pruritis 20 mL 1   gabapentin (NEURONTIN) 600 MG tablet TAKE ONE TABLET BY MOUTH AT BREAKFAST AND AT BEDTIME 60 tablet 3   glucose blood (ACCU-CHEK GUIDE) test strip Use to test blood sugar up to 2 times daily as needed 100 each 3   insulin lispro (HUMALOG KWIKPEN) 100 UNIT/ML KwikPen Inject 4-8 Units into the skin daily as needed. Sliding scale: Max daily 30 units 30 mL 1   Insulin Pen Needle 32G X 4 MM MISC 1 Device by Does not apply route in the morning, at noon, in the evening, and at bedtime. 400 each 3   isosorbide mononitrate (IMDUR) 30 MG 24 hr tablet Take 1 tablet (30 mg total) by mouth daily. 30 tablet 7   ketoconazole (NIZORAL) 2 % shampoo Apply 1 application. topically 2 (two) times a week. (Patient taking differently: Apply 1 application  topically daily as needed for irritation.) 120 mL 2   Lancets Misc. (ACCU-CHEK FASTCLIX LANCET) KIT Use to test blood sugar up to 2 times daily as needed 1 kit 1   levothyroxine (SYNTHROID) 150 MCG tablet TAKE ONE TABLET BY MOUTH BEFORE BREAKFAST 90 tablet 3   magnesium oxide (MAG-OX) 400 MG tablet TAKE ONE TABLET BY MOUTH EACH MORNING 90 tablet 2   metoprolol succinate (TOPROL-XL) 25 MG 24 hr tablet TAKE ONE TABLET BY MOUTH ONCE DAILY with OR immedaitely following A meal 90 tablet 3   Multiple Vitamin  (MULTIVITAMIN) tablet Take 1 tablet by mouth daily. K FREE DAILY once daily (MVI with no vitamin K)     nitroGLYCERIN (NITROSTAT) 0.4 MG SL tablet DISSOLVE ONE TABLET UNDER THE TONGUE AS NEEDED FOR CHST PAIN EVERY 5 MINUTES UP TO THREE TIMES. IF NO REFILL CALL 911. 75 tablet 2   pantoprazole (PROTONIX) 20 MG tablet TAKE ONE TABLET BY MOUTH DAILY 60 tablet 2   Polyethylene Glycol 3350 (MIRALAX PO) Take 1 Package by mouth daily as needed (constipation).     potassium chloride SA (KLOR-CON M) 20 MEQ tablet Take 2 tablets (40 mEq total) by mouth daily. 60 tablet 6   Propylene Glycol (SYSTANE BALANCE OP) Place 1 drop into both eyes daily as needed (for dry eyes).     sacubitril-valsartan (ENTRESTO) 24-26 MG Take 1 tablet by mouth 2 (  two) times daily. 90 tablet 3   Tafamidis Meglumine, Cardiac, (VYNDAQEL) 20 MG CAPS Take 4 capsules (80 mg total) by mouth daily. 120 capsule 11   tirzepatide (MOUNJARO) 7.5 MG/0.5ML Pen Inject 7.5 mg into the skin once a week. (Patient taking differently: Inject 7.5 mg into the skin once a week. Tuesdays) 6 mL 3   torsemide (DEMADEX) 20 MG tablet Take 4 tablets (80 mg total) by mouth daily. May take an additional 20 mg as needed for swelling 120 tablet 6   trolamine salicylate (ASPERCREME) 10 % cream Apply 1 application. topically as needed for muscle pain.     warfarin (COUMADIN) 2.5 MG tablet TAKE 1/2 TO 1 (ONE-HALF TO ONE) TABLET BY MOUTH ONCE DAILY AS DIRECTED BY THE COUMADIN CLINIC 90 tablet 0   No current facility-administered medications on file prior to visit.   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 01/2015 Cardiolite: + inf wall ischemia, EF 56%;  b. 01/2015 Cath: LM nl, LAD 73m, LCX min irregs, RCA dominant, 50-9m.   Sleep apnea    Swallowing difficulty    Allergies  Allergen Reactions   Ace Inhibitors Other (See Comments)    Unknown reaction   Amlodipine Other (See Comments)    Unknown reaction   Atenolol Other (See Comments)    bradycardia   Avandia [Rosiglitazone] Other (See Comments)    Unknown reaction   Darvon [Propoxyphene] Other (See Comments)    Hallucinations    Erythromycin Itching   Hydralazine Other (See Comments)    Burning in throat and chest   Hydrocodone Other (See Comments)    Hallucinations.   Levofloxacin Itching   Morphine And Codeine Other (See Comments)    Dizzy and hallucianation, vomiting; Willing to try low dose   Percocet [Oxycodone-Acetaminophen] Other (See Comments)    hallucination   Spironolactone Other (See Comments)    Unknown reaction   Tramadol Other (See Comments)    Unknown/does not recall reaction but does not want to take again    Social History   Socioeconomic History   Marital status: Widowed    Spouse name: Not on file   Number of children: Not on file   Years of education: Not on file   Highest education level: Associate degree: academic program  Occupational History   Occupation: Retired  Tobacco Use   Smoking status: Never   Smokeless tobacco: Never  Vaping Use   Vaping status: Never Used  Substance and Sexual Activity   Alcohol use: No   Drug use: Never   Sexual activity: Not Currently  Other Topics Concern   Not on file  Social History Narrative   Lives with daughter   Social Drivers of Health   Financial Resource Strain: Low Risk  (07/07/2023)   Overall Financial Resource Strain (CARDIA)    Difficulty of Paying Living Expenses: Not hard at all  Food Insecurity: Unknown (07/07/2023)   Hunger Vital Sign    Worried About Running Out of Food in the Last Year: Patient declined  Ran Out of Food in the Last Year: Never true  Transportation Needs: No Transportation Needs (07/07/2023)   PRAPARE - Administrator, Civil Service (Medical): No    Lack of Transportation (Non-Medical): No  Physical Activity: Inactive (07/07/2023)   Exercise Vital Sign    Days of Exercise per Week: 0 days    Minutes of Exercise per Session: 0 min  Stress: No Stress Concern Present (07/07/2023)   [employer] of Occupational Health - Occupational Stress Questionnaire    Feeling of Stress : Only a little  Social Connections: Moderately Integrated (07/07/2023)   Social Connection and Isolation Panel [NHANES]    Frequency of Communication with Friends and Family: More than three times a week    Frequency of Social Gatherings with Friends and Family: Once a week    Attends Religious Services: More than 4 times per year    Active Member of [employer] or Organizations: Yes    Attends Banker Meetings: More than 4 times per year    Marital Status: Widowed   Today's Vitals   01/09/24 1441  BP: 116/70  Pulse: 85  Resp: 16  Temp: (!) 96.9 F (36.1 C)  TempSrc: Temporal  SpO2: 98%  Weight: 226 lb 12.8 oz (102.9 kg)  Height: 5\' 2"  (1.575 m)   Body mass index is 41.48 kg/m.  Physical Exam Vitals and nursing note reviewed.  Constitutional:      General: She is not in acute distress.    Appearance: She is well-developed.  HENT:     Head: Normocephalic and atraumatic.     Right Ear: Tympanic membrane, ear canal and external ear normal.     Left Ear: Tympanic membrane, ear canal and external ear normal.     Mouth/Throat:     Mouth: Mucous membranes are moist.     Pharynx: Oropharynx is clear. Uvula midline.  Eyes:     Conjunctiva/sclera: Conjunctivae normal.  Cardiovascular:     Rate and Rhythm: Normal rate. Rhythm irregular.     Pulses:          Posterior tibial pulses are 2+ on the right side and 2+ on the left side.     Heart sounds: No murmur  heard. Pulmonary:     Effort: Pulmonary effort is normal. No respiratory distress.     Breath sounds: Normal breath sounds.  Abdominal:     Palpations: Abdomen is soft. There is no hepatomegaly or mass.     Tenderness: There is no abdominal tenderness.  Musculoskeletal:     Right shoulder: Tenderness present. Decreased range of motion.     Left shoulder: Tenderness present. Decreased range of motion.     Cervical back: No tenderness.     Thoracic back: No tenderness.     Lumbar back: No tenderness.     Right lower leg: No edema.     Left lower leg: No edema.     Comments: Some tender points LUE and LE's.  Lymphadenopathy:     Cervical: No cervical adenopathy.  Skin:    General: Skin is warm.     Findings: No erythema or rash.          Comments: Hyperpigmented, rounded thickness, not tender on left arm and right forearm. About 1 cm. No scalp lesions or dry scalp.  Neurological:     General: No focal deficit present.     Mental Status: She is alert and oriented to person, place, and time.  Cranial Nerves: No cranial nerve deficit.     Motor: No tremor or pronator drift.     Gait: Gait normal.     Comments: Gait assisted with a cane.  Psychiatric:        Mood and Affect: Affect normal. Mood is anxious.    ASSESSMENT AND PLAN:  Ms. Neace was seen today for chronic disease management.   Myalgia And associated arthralgias, specially shoulders. She is not interested in PT, she has done it before and knows some of the exercises. She is not sure if Duloxetine is helping, so recommend weaning medication off. Tylenol 500 mg 3-4 times per day. Stretching exercises.  Scalp pruritus Chronic. No scalp lesions. Nizoral shampoo did not help. Also ear canal pruritus, topical steroid did not help.  -     Ambulatory referral to Dermatology  Movement disorder Not present today. ? Essential tremor. Problem is chronic and getting worse, it is not interfering with ADL's.   -      Ambulatory referral to Neurology  Polyneuropathy associated with underlying disease (HCC) Continue Gabapentin 600 mg bid.  Numbness in both hands Chronic. ? Carpal tunnel, she also has hx of polyneuropathy.  -     Ambulatory referral to Neurology  Headache, unspecified headache type Chronic. She has seen neuro in the past, treated for PMR. Last CRP normal, elevated sed rate (could be due to some of her other co morbilities). We decided to hold on blood work today. No new associated symptoms. Clearly instructed about warning signs. Neuro appt will be arranged.  -     Ambulatory referral to Neurology  Prurigo nodularis  2 lesions, left arm and right forearm. Differential Dx discussed. Continue monitoring for changes.  I spent a total of 42 minutes in both face to face and non face to face activities for this visit on the date of this encounter. During this time history was obtained and documented, examination was performed, prior labs/imaging reviewed, and assessment/plan discussed.  Return in about 6 months (around 07/11/2024).  I, Rolla Etienne Wierda, acting as a scribe for Aissa Lisowski [place], MD., have documented all relevant documentation on the behalf of Preet Perrier [place], MD, as directed by  Cadell Gabrielson [place], MD while in the presence of Maebelle Sulton [place], MD.   I, Melo Stauber [place], MD, have reviewed all documentation for this visit. The documentation on 01/09/24 for the exam, diagnosis, procedures, and orders are all accurate and complete.  Grae Cannata G. [place], MD  [facility]. [place] office.

## 2024-01-10 ENCOUNTER — Telehealth (HOSPITAL_COMMUNITY): Payer: Self-pay | Admitting: Internal Medicine

## 2024-01-10 NOTE — Telephone Encounter (Signed)
 Called to confirm/remind patient of their appointment at [facility] on 03/.26/25.   Appointment:   [x] Confirmed  [] Left mess   [] No answer/No voice mail  [] Phone not in service  Patient reminded to bring all medications and/or complete list.  Confirmed patient has transportation. Gave directions, instructed to utilize valet parking.

## 2024-01-11 ENCOUNTER — Encounter (HOSPITAL_COMMUNITY): Payer: Self-pay | Admitting: Internal Medicine

## 2024-01-11 ENCOUNTER — Ambulatory Visit (HOSPITAL_COMMUNITY)
Admission: RE | Admit: 2024-01-11 | Discharge: 2024-01-11 | Disposition: A | Discharge status: Home / Self Care | Payer: Medicare Other | Source: Ambulatory Visit | Attending: Internal Medicine | Admitting: Internal Medicine

## 2024-01-11 VITALS — BP 90/60 | HR 89 | Wt 220.0 lb

## 2024-01-11 DIAGNOSIS — N184 Chronic kidney disease, stage 4 (severe): Secondary | ICD-10-CM | POA: Insufficient documentation

## 2024-01-11 DIAGNOSIS — E1122 Type 2 diabetes mellitus with diabetic chronic kidney disease: Secondary | ICD-10-CM | POA: Diagnosis not present

## 2024-01-11 DIAGNOSIS — E669 Obesity, unspecified: Secondary | ICD-10-CM | POA: Diagnosis not present

## 2024-01-11 DIAGNOSIS — E854 Organ-limited amyloidosis: Secondary | ICD-10-CM | POA: Insufficient documentation

## 2024-01-11 DIAGNOSIS — Z7901 Long term (current) use of anticoagulants: Secondary | ICD-10-CM | POA: Diagnosis not present

## 2024-01-11 DIAGNOSIS — I43 Cardiomyopathy in diseases classified elsewhere: Secondary | ICD-10-CM | POA: Diagnosis not present

## 2024-01-11 DIAGNOSIS — I5022 Chronic systolic (congestive) heart failure: Secondary | ICD-10-CM | POA: Insufficient documentation

## 2024-01-11 DIAGNOSIS — I4821 Permanent atrial fibrillation: Secondary | ICD-10-CM | POA: Diagnosis not present

## 2024-01-11 DIAGNOSIS — Z794 Long term (current) use of insulin: Secondary | ICD-10-CM | POA: Diagnosis not present

## 2024-01-11 DIAGNOSIS — I2510 Atherosclerotic heart disease of native coronary artery without angina pectoris: Secondary | ICD-10-CM | POA: Diagnosis not present

## 2024-01-11 DIAGNOSIS — G6289 Other specified polyneuropathies: Secondary | ICD-10-CM | POA: Insufficient documentation

## 2024-01-11 DIAGNOSIS — Z79899 Other long term (current) drug therapy: Secondary | ICD-10-CM | POA: Diagnosis not present

## 2024-01-11 DIAGNOSIS — I130 Hypertensive heart and chronic kidney disease with heart failure and stage 1 through stage 4 chronic kidney disease, or unspecified chronic kidney disease: Secondary | ICD-10-CM | POA: Diagnosis not present

## 2024-01-11 DIAGNOSIS — Z6841 Body Mass Index (BMI) 40.0 and over, adult: Secondary | ICD-10-CM | POA: Diagnosis not present

## 2024-01-11 LAB — BASIC METABOLIC PANEL WITH GFR
Anion gap: 9 (ref 5–15)
BUN: 40 mg/dL — ABNORMAL HIGH (ref 8–23)
CO2: 29 mmol/L (ref 22–32)
Calcium: 9.2 mg/dL (ref 8.9–10.3)
Chloride: 100 mmol/L (ref 98–111)
Creatinine, Ser: 2.11 mg/dL — ABNORMAL HIGH (ref 0.44–1.00)
GFR, Estimated: 23 mL/min — ABNORMAL LOW (ref >60)
Glucose, Bld: 107 mg/dL — ABNORMAL HIGH (ref 70–99)
Potassium: 4.8 mmol/L (ref 3.5–5.1)
Sodium: 138 mmol/L (ref 135–145)

## 2024-01-11 LAB — BRAIN NATRIURETIC PEPTIDE: B Natriuretic Peptide: 575 pg/mL — ABNORMAL HIGH (ref 0.0–100.0)

## 2024-01-11 NOTE — Progress Notes (Signed)
 ADVANCED HF CLINIC NOTE  Primary Care: [place], [name], MD Primary Cardiologist: Reatha Harps, MD HF Cardiologist: Dr. Gala Romney  CC: HF follow up  HPI: Regina [name] is a 83 y.o. woman with PMHx as below. Referred by Dr. Flora Lipps for further evaluation of he hATTR cardiac amyloidosis   Problem List 1.  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 Chronic AF - LAA sludge vs thrombus 05/05/2020  - persistent sludge 07/01/2020 - LAA thrombus persistent 08/05/2020 - on warfarin 3. DM - A1c 7.3 4. HTN 5. Non-obstructive CAD - LHC 01/19/2015 [facility] - 50% mid RCA and 50% LAD - MPI 08/14/2020 -> normal 6. OSA 7. CKD 4 (b/l Scr ~2.0) 8. CVA - 11/2020 @ [facility]  9. Polymyalgia Rheumatica  10. Morbid obesity  Currently on tafamadis (started 2021) and eplonteresen (started 2/24). Seen in f/u by Dr. Flora Lipps on 01/06/2023 with volume overload He increased her torsemide to 40/20  Echo repeated 02/03/2023 LVEF 40% with normal pressures.   She was seen in the ER 02/06/2023 for shortness of breath.  She was given IV Lasix with good result and discharged without admission.  She was seen by Bettina Gavia, PA on 02/09/23. Weight up 10 pounds despite mistakenly taking 120 mg torsemide daily. Labs with stable Scr 2.01  Initial visit with Dr. Gala Romney 5/24, NYHA III and volume up. Entresto started and given Furoscix for PRN home use with paramedicine support.  Follow up 03/24/23, remained volume overloaded with NYHA IIIb. Instructed to use Furoscix x 3 days. Saw Dr. Flora Lipps 04/03/23, volume up a bit but overall stable, advised to take her daily dose of diuretic (she had held it that day). Seen in ED 04/11/23 with CP. HsTroponin 54>>57. No ECG changes, symptoms improved with nitroglycerin, she  was started on Imdur 30 mg daily and felt stable for discharge home with close HF follow up.   Today she returns for HF follow up with her daughter.Remains on [place]. Not very active but can do ADLs without problem. Walks with can.e BP runs low but no lightheadedness. Mild swelling. No orthopnea or PND>    Cardiac Studies  - Echo (4/24): EF 40% with normal pressures  - Echo (7/22): EF 45-50%  - cMRI (1/22): EF 30% diffuse LGE ECV 67% RVEF 30%  - EF (10/21): EF 15% in Afib with RVR  - PYP + 7/21 (Grade 3)  Past Medical History:  Diagnosis Date   Back pain    CHF (congestive heart failure) (HCC)    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 01/2015 Cardiolite: + inf wall ischemia, EF 56%;  b. 01/2015 Cath: LM nl, LAD 41m, LCX min irregs, RCA dominant, 50-28m.   Sleep apnea    Swallowing difficulty    Current Outpatient Medications  Medication Sig Dispense Refill   acetaminophen (TYLENOL) 500 MG tablet Take 1,000 mg by  mouth every 6 (six) hours as needed for mild pain.     Alcohol Swabs (B-D SINGLE USE SWABS REGULAR) PADS Use to test blood sugar up to 3 times daily 100 each 3   allopurinol (ZYLOPRIM) 100 MG tablet Take 1 tablet (100 mg total) by mouth every morning. 90 tablet 1   atorvastatin (LIPITOR) 20 MG tablet TAKE ONE TABLET BY MOUTH EVERYDAY AT BEDTIME 90 tablet 3   Blood Glucose Monitoring Suppl (ACCU-CHEK GUIDE) w/Device KIT Use to check blood sugars 1 kit 0   cetirizine (ZYRTEC) 10 MG chewable tablet Chew 10 mg by mouth daily.     Continuous Blood Gluc Receiver (FREESTYLE LIBRE 2 READER) DEVI Use as instructed to check blood sugar. 1 each 0   Continuous Glucose Sensor (FREESTYLE  LIBRE 2 SENSOR) MISC USE TO MONITOR BLOOD SUGAR. CHANGE SENSOR EVERY 14 DAYS 6 each 3   DULoxetine (CYMBALTA) 30 MG capsule Take 30 mg by mouth every other day.     Eplontersen Sodium (WAINUA) 45 MG/0.8ML SOAJ Inject 45 mg into the skin every 30 (thirty) days. 2.4 mL 1   FARXIGA 10 MG TABS tablet Take 1 tablet (10 mg total) by mouth daily. 90 tablet 3   Fluocinolone Acetonide 0.01 % OIL instill one drop in ears every 2-3 DAYS AS NEEDED FOR pruritis 20 mL 1   gabapentin (NEURONTIN) 600 MG tablet TAKE ONE TABLET BY MOUTH AT BREAKFAST AND AT BEDTIME 60 tablet 3   glucose blood (ACCU-CHEK GUIDE) test strip Use to test blood sugar up to 2 times daily as needed 100 each 3   insulin lispro (HUMALOG KWIKPEN) 100 UNIT/ML KwikPen Inject 4-8 Units into the skin daily as needed. Sliding scale: Max daily 30 units 30 mL 1   Insulin Pen Needle 32G X 4 MM MISC 1 Device by Does not apply route in the morning, at noon, in the evening, and at bedtime. 400 each 3   isosorbide mononitrate (IMDUR) 30 MG 24 hr tablet Take 1 tablet (30 mg total) by mouth daily. 30 tablet 7   Lancets Misc. (ACCU-CHEK FASTCLIX LANCET) KIT Use to test blood sugar up to 2 times daily as needed 1 kit 1   levothyroxine (SYNTHROID) 150 MCG tablet TAKE ONE TABLET BY MOUTH BEFORE BREAKFAST 90 tablet 3   magnesium oxide (MAG-OX) 400 MG tablet TAKE ONE TABLET BY MOUTH EACH MORNING 90 tablet 2   metoprolol succinate (TOPROL-XL) 25 MG 24 hr tablet TAKE ONE TABLET BY MOUTH ONCE DAILY with OR immedaitely following A meal 90 tablet 3   Multiple Vitamin (MULTIVITAMIN) tablet Take 1 tablet by mouth daily. K FREE DAILY once daily (MVI with no vitamin K)     nitroGLYCERIN (NITROSTAT) 0.4 MG SL tablet DISSOLVE ONE TABLET UNDER THE TONGUE AS NEEDED FOR CHST PAIN EVERY 5 MINUTES UP TO THREE TIMES. IF NO REFILL CALL 911. 75 tablet 2   pantoprazole (PROTONIX) 20 MG tablet TAKE ONE TABLET BY MOUTH DAILY 60 tablet 2   Polyethylene Glycol 3350 (MIRALAX PO) Take 1  Package by mouth daily as needed (constipation).     potassium chloride SA (KLOR-CON M) 20 MEQ tablet Take 2 tablets (40 mEq total) by mouth daily. 60 tablet 6   Propylene Glycol (SYSTANE BALANCE OP) Place 1 drop into both eyes daily as needed (for dry eyes).     sacubitril-valsartan (ENTRESTO) 24-26 MG Take 1 tablet by mouth 2 (two) times daily. 90 tablet 3   Tafamidis Meglumine, Cardiac, (VYNDAQEL) 20 MG  CAPS Take 4 capsules (80 mg total) by mouth daily. 120 capsule 11   tirzepatide (MOUNJARO) 7.5 MG/0.5ML Pen Inject 7.5 mg into the skin once a week. 6 mL 3   torsemide (DEMADEX) 20 MG tablet Take 4 tablets (80 mg total) by mouth daily. May take an additional 20 mg as needed for swelling 120 tablet 6   trolamine salicylate (ASPERCREME) 10 % cream Apply 1 application. topically as needed for muscle pain.     warfarin (COUMADIN) 2.5 MG tablet TAKE 1/2 TO 1 (ONE-HALF TO ONE) TABLET BY MOUTH ONCE DAILY AS DIRECTED BY THE COUMADIN CLINIC 90 tablet 0   No current facility-administered medications for this encounter.   Allergies  Allergen Reactions   Ace Inhibitors Other (See Comments)    Unknown reaction   Amlodipine Other (See Comments)    Unknown reaction   Atenolol Other (See Comments)    bradycardia   Avandia [Rosiglitazone] Other (See Comments)    Unknown reaction   Darvon [Propoxyphene] Other (See Comments)    Hallucinations    Erythromycin Itching   Hydralazine Other (See Comments)    Burning in throat and chest   Hydrocodone Other (See Comments)    Hallucinations.   Levofloxacin Itching   Morphine And Codeine Other (See Comments)    Dizzy and hallucianation, vomiting; Willing to try low dose   Percocet [Oxycodone-Acetaminophen] Other (See Comments)    hallucination   Spironolactone Other (See Comments)    Unknown reaction   Tramadol Other (See Comments)    Unknown/does not recall reaction but does not want to take again   Social History   Socioeconomic History   Marital  status: Widowed    Spouse name: Not on file   Number of children: Not on file   Years of education: Not on file   Highest education level: Associate degree: academic program  Occupational History   Occupation: Retired  Tobacco Use   Smoking status: Never   Smokeless tobacco: Never  Vaping Use   Vaping status: Never Used  Substance and Sexual Activity   Alcohol use: No   Drug use: Never   Sexual activity: Not Currently  Other Topics Concern   Not on file  Social History Narrative   Lives with daughter   Social Drivers of Health   Financial Resource Strain: Low Risk  (07/07/2023)   Overall Financial Resource Strain (CARDIA)    Difficulty of Paying Living Expenses: Not hard at all  Food Insecurity: Unknown (07/07/2023)   Hunger Vital Sign    Worried About Running Out of Food in the Last Year: Patient declined    Ran Out of Food in the Last Year: Never true  Transportation Needs: No Transportation Needs (07/07/2023)   PRAPARE - Administrator, Civil Service (Medical): No    Lack of Transportation (Non-Medical): No  Physical Activity: Inactive (07/07/2023)   Exercise Vital Sign    Days of Exercise per Week: 0 days    Minutes of Exercise per Session: 0 min  Stress: No Stress Concern Present (07/07/2023)   [employer] of Occupational Health - Occupational Stress Questionnaire    Feeling of Stress : Only a little  Social Connections: Moderately Integrated (07/07/2023)   Social Connection and Isolation Panel [NHANES]    Frequency of Communication with Friends and Family: More than three times a week    Frequency of Social Gatherings with Friends and Family: Once a week    Attends Religious Services: More than 4  times per year    Active Member of Clubs or Organizations: Yes    Attends Banker Meetings: More than 4 times per year    Marital Status: Widowed  Intimate Partner Violence: Not At Risk (02/20/2023)   Humiliation, Afraid, Rape, and Kick  questionnaire    Fear of Current or Ex-Partner: No    Emotionally Abused: No    Physically Abused: No    Sexually Abused: No   Family History  Problem Relation Age of Onset   Heart disease Mother    Hypertension Mother    Cancer Father    Alcoholism Father    BP 90/60   Pulse 89   Wt 99.8 kg (220 lb)   SpO2 100%   BMI 40.24 kg/m   Wt Readings from Last 3 Encounters:  01/11/24 99.8 kg (220 lb)  01/09/24 102.9 kg (226 lb 12.8 oz)  11/28/23 102.6 kg (226 lb 3.2 oz)    PHYSICAL EXAM: General: Elderly. No resp difficulty HEENT: normal Neck: supple. no JVD. Carotids 2+ bilat; no bruits. No lymphadenopathy or thryomegaly appreciated. NWG:NFAOZ irreg Lungs: clear Abdomen: obese soft, nontender, nondistended. No hepatosplenomegaly. No bruits or masses. Good bowel sounds. Extremities: no cyanosis, clubbing, rash, edema Neuro: alert & orientedx3, cranial nerves grossly intact. moves all 4 extremities w/o difficulty. Affect pleasant  ASSESSMENT & PLAN:  1. 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 OK to take extra torsemide 40 mg PRN with 20 KCL PRN - Continue Tafamadis and eplonteresen - Continue Farxiga 10 mg daily.  - Continue Toprol XL 25 mg daily. - BP low. Drop Entresto to 12/12 bid  - labs today - Repeat echo in 6 months - Encouraged her to get >= 3K steps per day  2. CKD 4 - Baseline Scr 2.0 - Continue SGLT2i. - Labs today  3. Coronary artery disease  - Non-obstructive CAD in the past.   - No ASA as she is on warfarin  - No s/s angina - Continue statin, most recent LDL 48 - Followed by Dr. Flora Lipps   4. Permanent AF - Rate controlled -  Continue warfarin. Followed by Coumadin Clinic - Previously on Eliquis and Xarelto but switch to warfarin due to persistence of LAA clot - Zio 7 day (5/24) showed 100% AFL burden, HR 40-153 - If rates remain elevated, may be worth trial of DC-CV with amio support, but will defer for now.  5. Obesity  - Body mass index is 40.24 kg/m. - on tirzepatide   6. 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

## 2024-01-11 NOTE — Patient Instructions (Signed)
 DECREASE your Entresto to 12/13 mg daily (1/2 Tab)  Labs done today, your results will be available in MyChart, we will contact you for abnormal readings.  Your physician has requested that you have an echocardiogram. Echocardiography is a painless test that uses sound waves to create images of your heart. It provides your doctor with information about the size and shape of your heart and how well your heart's chambers and valves are working. This procedure takes approximately one hour. There are no restrictions for this procedure. Please do NOT wear cologne, perfume, aftershave, or lotions (deodorant is allowed). Please arrive 15 minutes prior to your appointment time.  Please note: We ask at that you not bring children with you during ultrasound (echo/ vascular) testing. Due to room size and safety concerns, children are not allowed in the ultrasound rooms during exams. Our front office staff cannot provide observation of children in our lobby area while testing is being conducted. An adult accompanying a patient to their appointment will only be allowed in the ultrasound room at the discretion of the ultrasound technician under special circumstances. We apologize for any inconvenience.  Your physician recommends that you schedule a follow-up appointment in: 6 months with an echocardiogram ( September) ** PLEASE CALL THE OFFICE IN [place] TO ARRANGE YOUR FOLLOW UP APPOINTMENT.**  If you have any questions or concerns before your next appointment please send [place] a message through [place] or call our office at [phone].    TO LEAVE A MESSAGE FOR THE NURSE SELECT OPTION 2, PLEASE LEAVE A MESSAGE INCLUDING: YOUR NAME DATE OF BIRTH CALL BACK NUMBER REASON FOR CALL**this is important as we prioritize the call backs  YOU WILL RECEIVE A CALL BACK THE SAME DAY AS LONG AS YOU CALL BEFORE 4:00 PM  At the [facility], you and your health needs are our priority. As part of our continuing  mission to provide you with exceptional heart care, we have created designated Provider Care Teams. These Care Teams include your primary Cardiologist (physician) and Advanced Practice Providers (APPs- Physician Assistants and Nurse Practitioners) who all work together to provide you with the care you need, when you need it.   You may see any of the following providers on your designated Care Team at your next follow up: Dr Arvilla Meres Dr Marca Ancona Dr. Dorthula Nettles Dr. Clearnce Hasten Amy Filbert Schilder, NP Robbie Lis, [place] [facility] [place], [place] Brynda Peon, NP [place], NP Clarisa Kindred, NP Karle Plumber, PharmD Enos Fling, PharmD   Please be sure to bring in all your medications bottles to every appointment.    Thank you for choosing [facility]

## 2024-01-12 ENCOUNTER — Other Ambulatory Visit: Payer: Self-pay | Admitting: Internal Medicine

## 2024-01-17 ENCOUNTER — Telehealth: Payer: Self-pay | Admitting: Cardiovascular Disease

## 2024-01-17 DIAGNOSIS — I4821 Permanent atrial fibrillation: Secondary | ICD-10-CM

## 2024-01-17 MED ORDER — WARFARIN SODIUM 2.5 MG PO TABS: ORAL_TABLET | ORAL | 0 refills | Status: DC

## 2024-01-17 NOTE — Telephone Encounter (Signed)
 Prescription refill request received for warfarin Lov: 01/11/24 (Bensimhon)  Next INR check: 01/25/24 Warfarin tablet strength: 2.5mg   Appropriate dose. Refill sent.

## 2024-01-17 NOTE — Telephone Encounter (Signed)
*  STAT* If patient is at the pharmacy, call can be transferred to refill team.   1. Which medications need to be refilled? (please list name of each medication and dose if known)   warfarin (COUMADIN) 2.5 MG tablet (green tablet)  2. Would you like to learn more about the convenience, safety, & potential cost savings by using the [facility]?   3. Are you open to using the [facility] (Type [facility]. ).  4. Which pharmacy/location (including street and city if local pharmacy) is medication to be sent to?  [facility] [address] [place] - [address]   5. Do they need a 30 day or 90 day supply?   90 day  Patient stated she only has a few tablets left.

## 2024-01-19 ENCOUNTER — Other Ambulatory Visit: Payer: Self-pay

## 2024-01-19 NOTE — Progress Notes (Signed)
 Specialty Pharmacy Refill Coordination Note  Regina [name] is a 83 y.o. female contacted today regarding refills of specialty medication(s) Tafamidis Meglumine (Cardiac) [name])   Patient requested Delivery   Delivery date: 01/25/24   Verified address: [address], [place], [address]   Medication will be filled on 04.09.25.

## 2024-01-24 ENCOUNTER — Encounter: Payer: Self-pay | Admitting: Neurology

## 2024-01-25 ENCOUNTER — Ambulatory Visit: Payer: Medicare Other | Attending: Cardiovascular Disease | Admitting: *Deleted

## 2024-01-25 DIAGNOSIS — Z7901 Long term (current) use of anticoagulants: Secondary | ICD-10-CM | POA: Diagnosis not present

## 2024-01-25 DIAGNOSIS — I4819 Other persistent atrial fibrillation: Secondary | ICD-10-CM | POA: Diagnosis not present

## 2024-01-25 LAB — POCT INR: POC INR: 4.2

## 2024-01-25 NOTE — Patient Instructions (Addendum)
 Description   (USE GREEN TABLET ONLY)   Hold warfarin today Tomorrow take 1/2 a tablet of warfarin  Then continue taking warfarin 1 tablet (2.5mg ) daily except for 1/2 tablet (1.25mg ) on Thursdays.  Recheck INR in 2 weeks. Anticoagulation Clinic [phone]        1st Floor: - Lobby - Registration  - Pharmacy  - Lab - Cafe  2nd Floor: - PV Lab - Diagnostic Testing (echo, CT, nuclear med)  3rd Floor: - Vacant  4th Floor: - TCTS (cardiothoracic surgery) - AFib Clinic - Structural Heart Clinic - Vascular Surgery  - Vascular Ultrasound  5th Floor: - HeartCare Cardiology (general and EP) - Clinical Pharmacy for coumadin, hypertension, lipid, weight-loss medications, and med management appointments    Valet parking services will be available as well.

## 2024-02-05 ENCOUNTER — Other Ambulatory Visit: Payer: Self-pay

## 2024-02-05 MED ORDER — TIRZEPATIDE 7.5 MG/0.5ML ~~LOC~~ SOAJ: 7.5000 mg | SUBCUTANEOUS | 3 refills | Status: DC

## 2024-02-06 ENCOUNTER — Other Ambulatory Visit (HOSPITAL_COMMUNITY): Payer: Self-pay

## 2024-02-06 DIAGNOSIS — L2989 Other pruritus: Secondary | ICD-10-CM | POA: Diagnosis not present

## 2024-02-06 DIAGNOSIS — I5022 Chronic systolic (congestive) heart failure: Secondary | ICD-10-CM

## 2024-02-06 MED ORDER — ISOSORBIDE MONONITRATE ER 30 MG PO TB24: 30.0000 mg | ORAL_TABLET | Freq: Every day | ORAL | 6 refills | Status: DC

## 2024-02-13 ENCOUNTER — Ambulatory Visit: Attending: Internal Medicine

## 2024-02-13 DIAGNOSIS — D6869 Other thrombophilia: Secondary | ICD-10-CM

## 2024-02-13 DIAGNOSIS — I4819 Other persistent atrial fibrillation: Secondary | ICD-10-CM | POA: Diagnosis not present

## 2024-02-13 DIAGNOSIS — Z7901 Long term (current) use of anticoagulants: Secondary | ICD-10-CM | POA: Diagnosis not present

## 2024-02-13 LAB — POCT INR: INR: 2.7 (ref 2.0–3.0)

## 2024-02-13 NOTE — Patient Instructions (Signed)
(  USE GREEN TABLET ONLY)    continue taking warfarin 1 tablet (2.5mg ) daily except for 1/2 tablet (1.25mg ) on Thursdays.  Recheck INR in 4 weeks. Anticoagulation Clinic [phone]

## 2024-02-14 ENCOUNTER — Other Ambulatory Visit: Payer: Self-pay

## 2024-02-19 ENCOUNTER — Other Ambulatory Visit (HOSPITAL_COMMUNITY): Payer: Self-pay

## 2024-02-21 ENCOUNTER — Other Ambulatory Visit (HOSPITAL_COMMUNITY): Payer: Self-pay

## 2024-02-21 ENCOUNTER — Other Ambulatory Visit: Payer: Self-pay

## 2024-02-22 ENCOUNTER — Other Ambulatory Visit: Payer: Self-pay

## 2024-02-22 NOTE — Progress Notes (Signed)
 Specialty Pharmacy Refill Coordination Note  Regina [name] is a 83 y.o. female contacted today regarding refills of specialty medication(s) Tafamidis  Meglumine  (Cardiac) (Vyndaqel )   Patient requested Delivery   Delivery date: 02/26/24   Verified address: [address]   [place] [address]   Medication will be filled on 02/23/24.

## 2024-02-23 ENCOUNTER — Other Ambulatory Visit: Payer: Self-pay

## 2024-03-04 ENCOUNTER — Ambulatory Visit

## 2024-03-04 VITALS — Ht 62.0 in | Wt 221.0 lb

## 2024-03-04 DIAGNOSIS — Z0000 Encounter for general adult medical examination without abnormal findings: Secondary | ICD-10-CM | POA: Diagnosis not present

## 2024-03-04 NOTE — Patient Instructions (Addendum)
 [name] , Thank you for taking time out of your busy schedule to complete your Annual Wellness Visit with me. I enjoyed our conversation and look forward to speaking with you again next year. I, as well as your care team,  appreciate your ongoing commitment to your health goals. Please review the following plan we discussed and let me know if I can assist you in the future. Your Game plan/ To Do List    Referrals: If you haven't heard from the office you've been referred to, please reach out to them at the phone provided.   Follow up Visits: Next Medicare AWV with our clinical staff: 03/12/25 @ 8:40a   Have you seen your provider in the last 6 months (3 months if uncontrolled diabetes)?  Next Office Visit with your provider: 07/09/24 @ 1p  Clinician Recommendations:  Aim for 30 minutes of exercise or brisk walking, 6-8 glasses of water, and 5 servings of fruits and vegetables each day.       This is a list of the screening recommended for you and due dates:  Health Maintenance  Topic Date Due   DTaP/Tdap/Td vaccine (1 - Tdap) Never done   Zoster (Shingles) Vaccine (2 of 2) 12/18/2021   COVID-19 Vaccine (5 - 2024-25 season) 06/18/2023   Complete foot exam   01/31/2024   Hemoglobin A1C  04/05/2024   Flu Shot  05/17/2024   Yearly kidney health urinalysis for diabetes  05/23/2024   Eye exam for diabetics  05/23/2024   Yearly kidney function blood test for diabetes  01/10/2025   Medicare Annual Wellness Visit  03/04/2025   Pneumonia Vaccine  Completed   DEXA scan (bone density measurement)  Completed   HPV Vaccine  Aged Out   Meningitis B Vaccine  Aged Out   Hepatitis C Screening  Discontinued    Advanced directives: (In Chart) A copy of your advanced directives are scanned into your chart should your provider ever need it. Advance Care Planning is important because it:  [x]  Makes sure you receive the medical care that is consistent with your values, goals, and preferences  [x]   It provides guidance to your family and loved ones and reduces their decisional burden about whether or not they are making the right decisions based on your wishes.  Follow the link provided in your after visit summary or read over the paperwork we have mailed to you to help you started getting your Advance Directives in place. If you need assistance in completing these, please reach out to us  so that we can help you!  See attachments for Preventive Care and Fall Prevention Tips.

## 2024-03-04 NOTE — Progress Notes (Signed)
 Subjective:   Regina [name] is a 83 y.o. who presents for a Medicare Wellness preventive visit.  As a reminder, Annual Wellness Visits don't include a physical exam, and some assessments may be limited, especially if this visit is performed virtually. We may recommend an in-person follow-up visit with your provider if needed.  Visit Complete: Virtual I connected with  Tonia Frankel on 03/04/24 by a audio enabled telemedicine application and verified that I am speaking with the correct person using two identifiers.  Patient Location: Home  Provider Location: Home Office  I discussed the limitations of evaluation and management by telemedicine. The patient expressed understanding and agreed to proceed.  Vital Signs: Because this visit was a virtual/telehealth visit, some criteria may be missing or patient reported. Any vitals not documented were not able to be obtained and vitals that have been documented are patient reported.    Persons Participating in Visit: Patient.  AWV Questionnaire: No: Patient Medicare AWV questionnaire was not completed prior to this visit.  Cardiac Risk Factors include: advanced age (>18men, >44 women);diabetes mellitus;hypertension     Objective:     Today's Vitals   03/04/24 0843  Weight: 221 lb (100.2 kg)  Height: 5\' 2"  (1.575 m)   Body mass index is 40.42 kg/m.     03/04/2024    8:52 AM 10/26/2023    6:09 PM 10/26/2023    6:08 PM 04/10/2023    4:33 AM 02/05/2023    6:37 PM 01/10/2023    2:59 PM 02/16/2022   10:05 AM  Advanced Directives  Does Patient Have a Medical Advance Directive? Yes No No Yes Yes Yes Yes  Type of Estate agent of [place];Living will   Healthcare Power of [place];Living will Living will;Healthcare Power of [employer] Power of [place];Living will  Does patient want to make changes to medical advance directive? No - Patient declined     No - Patient declined No - Patient declined  Copy  of Healthcare Power of Attorney in Chart? Yes - validated most recent copy scanned in chart (See row information)      No - copy requested  Would patient like information on creating a medical advance directive?  No - Patient declined No - Patient declined        Current Medications (verified) Outpatient Encounter Medications as of 03/04/2024  Medication Sig   Accu-Chek Softclix Lancets lancets USE TO TEST BLOOD SUGAR UP TO TWO TIMES DAILY AS NEEDED   acetaminophen  (TYLENOL ) 500 MG tablet Take 1,000 mg by mouth every 6 (six) hours as needed for mild pain.   Alcohol  Swabs (B-D SINGLE USE SWABS REGULAR) PADS Use to test blood sugar up to 3 times daily   allopurinol  (ZYLOPRIM ) 100 MG tablet Take 1 tablet (100 mg total) by mouth every morning.   atorvastatin  (LIPITOR) 20 MG tablet TAKE ONE TABLET BY MOUTH EVERYDAY AT BEDTIME   Blood Glucose Monitoring Suppl (ACCU-CHEK GUIDE) w/Device KIT Use to check blood sugars   cetirizine (ZYRTEC) 10 MG chewable tablet Chew 10 mg by mouth daily.   Continuous Blood Gluc Receiver (FREESTYLE LIBRE 2 READER) DEVI Use as instructed to check blood sugar.   Continuous Glucose Sensor (FREESTYLE LIBRE 2 SENSOR) MISC USE TO MONITOR BLOOD SUGAR. CHANGE SENSOR EVERY 14 DAYS   DULoxetine  (CYMBALTA ) 30 MG capsule Take 30 mg by mouth every other day.   Eplontersen  Sodium (WAINUA ) 45 MG/0.8ML SOAJ Inject 45 mg into the skin every 30 (thirty)  days.   FARXIGA  10 MG TABS tablet Take 1 tablet (10 mg total) by mouth daily.   Fluocinolone  Acetonide 0.01 % OIL instill one drop in ears every 2-3 DAYS AS NEEDED FOR pruritis   gabapentin  (NEURONTIN ) 600 MG tablet TAKE ONE TABLET BY MOUTH AT BREAKFAST AND AT BEDTIME   glucose blood (ACCU-CHEK GUIDE) test strip Use to test blood sugar up to 2 times daily as needed   insulin  lispro (HUMALOG  KWIKPEN) 100 UNIT/ML KwikPen Inject 4-8 Units into the skin daily as needed. Sliding scale: Max daily 30 units   Insulin  Pen Needle 32G X 4 MM MISC 1  Device by Does not apply route in the morning, at noon, in the evening, and at bedtime.   isosorbide  mononitrate (IMDUR ) 30 MG 24 hr tablet Take 1 tablet (30 mg total) by mouth daily.   Lancets Misc. (ACCU-CHEK FASTCLIX LANCET) KIT Use to test blood sugar up to 2 times daily as needed   levothyroxine  (SYNTHROID ) 150 MCG tablet TAKE ONE TABLET BY MOUTH BEFORE BREAKFAST   magnesium  oxide (MAG-OX) 400 MG tablet TAKE ONE TABLET BY MOUTH EACH MORNING   metoprolol  succinate (TOPROL -XL) 25 MG 24 hr tablet TAKE ONE TABLET BY MOUTH ONCE DAILY with OR immedaitely following A meal   Multiple Vitamin (MULTIVITAMIN) tablet Take 1 tablet by mouth daily. K FREE DAILY once daily (MVI with no vitamin K)   nitroGLYCERIN  (NITROSTAT ) 0.4 MG SL tablet DISSOLVE ONE TABLET UNDER THE TONGUE AS NEEDED FOR CHST PAIN EVERY 5 MINUTES UP TO THREE TIMES. IF NO REFILL CALL 911.   pantoprazole  (PROTONIX ) 20 MG tablet TAKE ONE TABLET BY MOUTH DAILY   Polyethylene Glycol 3350  (MIRALAX  PO) Take 1 Package by mouth daily as needed (constipation).   potassium chloride  SA (KLOR-CON  M) 20 MEQ tablet Take 2 tablets (40 mEq total) by mouth daily.   Propylene Glycol (SYSTANE BALANCE OP) Place 1 drop into both eyes daily as needed (for dry eyes).   sacubitril -valsartan  (ENTRESTO ) 24-26 MG Take 1 tablet by mouth 2 (two) times daily.   Tafamidis  Meglumine , Cardiac, (VYNDAQEL ) 20 MG CAPS Take 4 capsules (80 mg total) by mouth daily.   tirzepatide  (MOUNJARO ) 7.5 MG/0.5ML Pen Inject 7.5 mg into the skin once a week.   torsemide  (DEMADEX ) 20 MG tablet Take 4 tablets (80 mg total) by mouth daily. May take an additional 20 mg as needed for swelling   trolamine salicylate (ASPERCREME) 10 % cream Apply 1 application. topically as needed for muscle pain.   warfarin (COUMADIN ) 2.5 MG tablet TAKE 1/2 TO 1 (ONE-HALF TO ONE) TABLET BY MOUTH ONCE DAILY AS DIRECTED BY THE COUMADIN  CLINIC   No facility-administered encounter medications on file as of  03/04/2024.    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 01/2015 Cardiolite : + inf wall ischemia, EF 56%;  b. 01/2015 Cath: LM nl, LAD 47m, LCX min irregs,  RCA dominant, 50-38m.   Sleep apnea    Swallowing difficulty    Past Surgical History:  Procedure Laterality Date   ABDOMINAL HYSTERECTOMY     BUBBLE STUDY  07/01/2020   Procedure: BUBBLE STUDY;  Surgeon: Euell Herrlich, MD;  Location: [facility] ENDOSCOPY;  Service: Cardiovascular;;   BUBBLE STUDY  08/05/2020   Procedure: BUBBLE STUDY;  Surgeon: Harrold Lincoln, MD;  Location: [facility] ENDOSCOPY;  Service: Cardiovascular;;  done with definity     CARDIAC CATHETERIZATION     ESOPHAGOGASTRODUODENOSCOPY     a. 01/2015 EGD: patent esophagus.   ESOPHAGOGASTRODUODENOSCOPY N/A 01/20/2015   Procedure: ESOPHAGOGASTRODUODENOSCOPY (EGD);  Surgeon: Alvis Jourdain, MD;  Location: [facility] ENDOSCOPY;  Service: Endoscopy;  Laterality: N/A;   HAND SURGERY     JOINT REPLACEMENT     reports history bilateral TKA and right TSA   LEFT HEART CATHETERIZATION WITH CORONARY ANGIOGRAM N/A 01/19/2015   RIGHT HEART CATH N/A 12/27/2021   Procedure: RIGHT HEART CATH;  Surgeon: Avanell Leigh, MD;  Location: [facility] INVASIVE CV LAB;  Service: Cardiovascular;  Laterality: N/A;   TEE WITHOUT CARDIOVERSION  05/04/2020   TEE WITHOUT CARDIOVERSION N/A  05/05/2020   Procedure: TRANSESOPHAGEAL ECHOCARDIOGRAM (TEE);  Surgeon: Hazle Lites, MD;  Location: [facility] ENDOSCOPY;  Service: Cardiovascular;  Laterality: N/A;   TEE WITHOUT CARDIOVERSION N/A 07/01/2020   Procedure: TRANSESOPHAGEAL ECHOCARDIOGRAM (TEE);  Surgeon: Euell Herrlich, MD;  Location: [facility] ENDOSCOPY;  Service: Cardiovascular;  Laterality: N/A;   TEE WITHOUT CARDIOVERSION N/A 08/05/2020   Procedure: TRANSESOPHAGEAL ECHOCARDIOGRAM (TEE);  Surgeon: Harrold Lincoln, MD;  Location: [facility] ENDOSCOPY;  Service: Cardiovascular;  Laterality: N/A;   THYROIDECTOMY     Family History  Problem Relation Age of Onset   Heart disease Mother    Hypertension Mother    Cancer Father    Alcoholism Father    Social History   Socioeconomic History   Marital status: Widowed    Spouse name: Not on file   Number of children: Not on file   Years of education: Not on file   Highest education level: Associate degree: academic program  Occupational History   Occupation: Retired  Tobacco Use   Smoking status: Never   Smokeless tobacco: Never  Vaping Use   Vaping status: Never Used  Substance and Sexual Activity   Alcohol  use: No   Drug use: Never   Sexual activity: Not Currently  Other Topics Concern   Not on file  Social History Narrative   Lives with daughter   Social Drivers of Health   Financial Resource Strain: Low Risk  (03/04/2024)   Overall Financial Resource Strain (CARDIA)    Difficulty of Paying Living Expenses: Not hard at all  Food Insecurity: No Food Insecurity (03/04/2024)   Hunger Vital Sign    Worried About Running Out of Food in the Last Year: Never true    Ran Out of Food in the Last Year: Never true  Transportation Needs: No Transportation Needs (03/04/2024)   PRAPARE - Administrator, Civil Service (Medical): No    Lack of Transportation (Non-Medical): No  Physical Activity: Inactive (03/04/2024)   Exercise Vital Sign    Days of Exercise per Week: 0  days    Minutes of Exercise per Session: 0 min  Stress: No Stress Concern Present (03/04/2024)   [employer] of Occupational Health - Occupational Stress Questionnaire    Feeling of Stress : Not at all  Social Connections: Moderately Integrated (03/04/2024)  Social Advertising account executive [NHANES]    Frequency of Communication with Friends and Family: More than three times a week    Frequency of Social Gatherings with Friends and Family: More than three times a week    Attends Religious Services: More than 4 times per year    Active Member of [employer] or Organizations: Yes    Attends Banker Meetings: More than 4 times per year    Marital Status: Widowed    Tobacco Counseling Counseling given: Not Answered    Clinical Intake:  Pre-visit preparation completed: Yes  Pain : No/denies pain     BMI - recorded: 40.42 Nutritional Status: BMI > 30  Obese Nutritional Risks: None Diabetes: Yes CBG done?: No Did pt. bring in CBG monitor from home?: No  Lab Results  Component Value Date   HGBA1C 6.3 (A) 10/06/2023   HGBA1C 7.0 (A) 04/27/2023   HGBA1C 7.7 (A) 01/31/2023     How often do you need to have someone help you when you read instructions, pamphlets, or other written materials from your doctor or pharmacy?: 1 - Never  Interpreter Needed?: No  Information entered by :: Farris Hong LPN   Activities of Daily Living     03/04/2024    8:51 AM  In your present state of health, do you have any difficulty performing the following activities:  Hearing? 0  Vision? 0  Difficulty concentrating or making decisions? 0  Walking or climbing stairs? 1  Comment Uses a Cane  Dressing or bathing? 0  Doing errands, shopping? 0  Preparing Food and eating ? N  Using the Toilet? N  In the past six months, have you accidently leaked urine? N  Do you have problems with loss of bowel control? N  Managing your Medications? N  Managing your Finances? N   Housekeeping or managing your Housekeeping? N    Patient Care Team: [place], [name], MD as PCP - General (Family Medicine) O'Neal, Cathay Clonts, MD as PCP - Cardiology (Internal Medicine) Zilphia Hilt Daria Eddy, [facility] (Pharmacist)  Indicate any recent Medical Services you may have received from other than [facility] providers in the past year (date may be approximate).     Assessment:    This is a routine wellness examination for Regina [name].  Hearing/Vision screen Hearing Screening - Comments:: Denies hearing difficulties   Vision Screening - Comments:: Wears rx glasses - up to date with routine eye exams with  Dr Candi Chafe   Goals Addressed               This Visit's Progress     Increase physical activity (pt-stated)        Get more active       Depression Screen     03/04/2024    8:50 AM 02/20/2023   10:18 AM 12/20/2022   11:14 AM 09/19/2022   11:05 AM 03/18/2022   12:03 PM 02/16/2022   10:00 AM 01/04/2022    2:07 PM  PHQ 2/9 Scores  PHQ - 2 Score 0 0 0 0 0 0 0    Fall Risk     03/04/2024    8:52 AM 11/28/2023    3:39 PM 07/07/2023   10:45 AM 02/20/2023   10:19 AM 12/20/2022   11:14 AM  Fall Risk   Falls in the past year? 0 0 0 1 0  Number falls in past yr: 0   0 0  Injury with Fall? 0   1 0  Comment    Injury to left shoulder, Followed by medical attention.   Risk for fall due to : No Fall Risks   No Fall Risks Other (Comment)  Follow up Falls prevention discussed;Falls evaluation completed   Falls prevention discussed Falls evaluation completed    MEDICARE RISK AT HOME:  Medicare Risk at Home Any stairs in or around the home?: Yes If so, are there any without handrails?: No Home free of loose throw rugs in walkways, pet beds, electrical cords, etc?: Yes Adequate lighting in your home to reduce risk of falls?: Yes Life alert?: Yes Use of a cane, walker or w/c?: Yes Grab bars in the bathroom?: No Shower chair or bench in shower?: Yes Elevated toilet seat or a handicapped  toilet?: No  TIMED UP AND GO:  Was the test performed?  No  Cognitive Function: 6CIT completed        03/04/2024    8:52 AM 02/20/2023   10:22 AM 02/16/2022   10:08 AM 02/10/2021    9:49 AM  6CIT Screen  What Year? 0 points 0 points 0 points 0 points  What month? 0 points 0 points 0 points 0 points  What time? 0 points 0 points 0 points   Count back from 20 0 points 0 points 0 points 0 points  Months in reverse 0 points 0 points 0 points 0 points  Repeat phrase 0 points 0 points 0 points 0 points  Total Score 0 points 0 points 0 points     Immunizations Immunization History  Administered Date(s) Administered   Fluad Quad(high Dose 65+) 06/21/2019, 08/01/2022   Fluad Trivalent(High Dose 65+) 07/12/2023   Influenza,inj,Quad PF,6+ Mos 01/17/2015   Influenza-Unspecified 01/17/2015, 10/25/2016   PFIZER(Purple Top)SARS-COV-2 Vaccination 11/06/2019, 11/27/2019, 08/12/2020, 10/23/2021   PNEUMOCOCCAL CONJUGATE-20 12/28/2021   Pneumococcal Polysaccharide-23 05/01/2020   Zoster Recombinant(Shingrix) 10/23/2021, 10/23/2021    Screening Tests Health Maintenance  Topic Date Due   DTaP/Tdap/Td (1 - Tdap) Never done   Zoster Vaccines- Shingrix (2 of 2) 12/18/2021   COVID-19 Vaccine (5 - 2024-25 season) 06/18/2023   FOOT EXAM  01/31/2024   HEMOGLOBIN A1C  04/05/2024   INFLUENZA VACCINE  05/17/2024   Diabetic kidney evaluation - Urine ACR  05/23/2024   OPHTHALMOLOGY EXAM  05/23/2024   Diabetic kidney evaluation - eGFR measurement  01/10/2025   Medicare Annual Wellness (AWV)  03/04/2025   Pneumonia Vaccine 26+ Years old  Completed   DEXA SCAN  Completed   HPV VACCINES  Aged Out   Meningococcal B Vaccine  Aged Out   Hepatitis C Screening  Discontinued    Health Maintenance  Health Maintenance Due  Topic Date Due   DTaP/Tdap/Td (1 - Tdap) Never done   Zoster Vaccines- Shingrix (2 of 2) 12/18/2021   COVID-19 Vaccine (5 - 2024-25 season) 06/18/2023   FOOT EXAM  01/31/2024    Health Maintenance Items Addressed: Foot Exam deferred  Additional Screening:  Vision Screening: Recommended annual ophthalmology exams for early detection of glaucoma and other disorders of the eye.  Dental Screening: Recommended annual dental exams for proper oral hygiene  Community Resource Referral / Chronic Care Management: CRR required this visit?  No   CCM required this visit?  No   Plan:    I have personally reviewed and noted the following in the patient's chart:   Medical and social history Use of alcohol , tobacco or illicit drugs  Current medications and supplements including opioid prescriptions. Patient is not currently taking opioid  prescriptions. Functional ability and status Nutritional status Physical activity Advanced directives List of other physicians Hospitalizations, surgeries, and ER visits in previous 12 months Vitals Screenings to include cognitive, depression, and falls Referrals and appointments  In addition, I have reviewed and discussed with patient certain preventive protocols, quality metrics, and best practice recommendations. A written personalized care plan for preventive services as well as general preventive health recommendations were provided to patient.   Dewayne Ford, LPN   3/[id number]/4403   After Visit Summary: (MyChart) Due to this being a telephonic visit, the after visit summary with patients personalized plan was offered to patient via MyChart   Notes: Nothing significant to report at this time.

## 2024-03-06 ENCOUNTER — Other Ambulatory Visit: Payer: Self-pay

## 2024-03-06 MED ORDER — INSULIN PEN NEEDLE 32G X 4 MM MISC: 1.0000 | Freq: Four times a day (QID) | 3 refills | Status: AC

## 2024-03-06 NOTE — Telephone Encounter (Signed)
 Pen needles sent per patient request

## 2024-03-08 ENCOUNTER — Telehealth: Payer: Self-pay | Admitting: *Deleted

## 2024-03-08 NOTE — Telephone Encounter (Signed)
 Pt called to confirm her upcoming appointment. Advised it is 03/15/24 at 2pm & she confirmed & thankful for the call back.

## 2024-03-12 ENCOUNTER — Telehealth: Payer: Self-pay | Admitting: Pulmonary Disease

## 2024-03-12 ENCOUNTER — Telehealth: Payer: Self-pay | Admitting: Cardiovascular Disease

## 2024-03-12 NOTE — Telephone Encounter (Signed)
 Patient needs a new handicap placard and needs approval from Dr. Rolm Clos. She is unsure of exactly what is needed and plans to contact the DMV now. She would like to stop by the office to have this taken care of on Friday if possible.

## 2024-03-12 NOTE — Telephone Encounter (Signed)
 Order went through [employer], faxing back to [place] to contact [employer].

## 2024-03-12 NOTE — Telephone Encounter (Signed)
 Patient is requesting 2 handicap placards from Dr. Rolm Clos. She will be coming to the Coumadin  Clinic on Friday 5/30 and would be able to pick up the signed form then.  Will forward to Dr. Edsel Grace nurse to address tomorrow 5/28.

## 2024-03-12 NOTE — Telephone Encounter (Signed)
 Cmn received from [facility] for CPAP supplies.

## 2024-03-13 ENCOUNTER — Telehealth: Payer: Self-pay | Admitting: Cardiovascular Disease

## 2024-03-13 NOTE — Telephone Encounter (Signed)
 Returned call and spoke with pt's daughter, Almira Armour. She requested if pt's appt could be changed to Monday and if moving coumadin  clinic appts a few days will affect INR. I made her aware we do not have any available appts on Monday, 03/18/24; however, when coumadin  clinic appts are moved a few days out from original appt, it does not affect INR. As long and INR is monitored frequently as prescribed by coumadin  clinic. She verbalized understanding and was very appreciative for the information. Pt will keep current appt for Friday, 03/15/24.

## 2024-03-13 NOTE — Telephone Encounter (Signed)
 Pt;s daughter requesting a cb regarding a coumadin  question

## 2024-03-15 ENCOUNTER — Ambulatory Visit: Attending: Cardiovascular Disease

## 2024-03-15 DIAGNOSIS — Z7901 Long term (current) use of anticoagulants: Secondary | ICD-10-CM

## 2024-03-15 DIAGNOSIS — I4819 Other persistent atrial fibrillation: Secondary | ICD-10-CM

## 2024-03-15 DIAGNOSIS — N1832 Chronic kidney disease, stage 3b: Secondary | ICD-10-CM | POA: Diagnosis not present

## 2024-03-15 DIAGNOSIS — D6869 Other thrombophilia: Secondary | ICD-10-CM

## 2024-03-15 LAB — POCT INR: INR: 3.3 — AB (ref 2.0–3.0)

## 2024-03-15 NOTE — Telephone Encounter (Signed)
 Pt calling back stating that her card does not expire until 05/2028 so disregard this request

## 2024-03-15 NOTE — Progress Notes (Signed)
 done

## 2024-03-15 NOTE — Patient Instructions (Signed)
(  USE GREEN TABLET ONLY)    Hold today only then continue taking warfarin 1 tablet (2.5mg ) daily except for 1/2 tablet (1.25mg ) on Thursdays.  Recheck INR in 3 weeks. Anticoagulation Clinic [phone]

## 2024-03-18 DIAGNOSIS — I43 Cardiomyopathy in diseases classified elsewhere: Secondary | ICD-10-CM | POA: Diagnosis not present

## 2024-03-18 DIAGNOSIS — I509 Heart failure, unspecified: Secondary | ICD-10-CM | POA: Diagnosis not present

## 2024-03-18 DIAGNOSIS — E1122 Type 2 diabetes mellitus with diabetic chronic kidney disease: Secondary | ICD-10-CM | POA: Diagnosis not present

## 2024-03-18 DIAGNOSIS — E785 Hyperlipidemia, unspecified: Secondary | ICD-10-CM | POA: Diagnosis not present

## 2024-03-18 DIAGNOSIS — E854 Organ-limited amyloidosis: Secondary | ICD-10-CM | POA: Diagnosis not present

## 2024-03-18 DIAGNOSIS — N184 Chronic kidney disease, stage 4 (severe): Secondary | ICD-10-CM | POA: Diagnosis not present

## 2024-03-18 DIAGNOSIS — I2510 Atherosclerotic heart disease of native coronary artery without angina pectoris: Secondary | ICD-10-CM | POA: Diagnosis not present

## 2024-03-21 ENCOUNTER — Other Ambulatory Visit: Payer: Self-pay

## 2024-03-26 ENCOUNTER — Other Ambulatory Visit: Payer: Self-pay | Admitting: Pharmacy Technician

## 2024-03-26 ENCOUNTER — Other Ambulatory Visit: Payer: Self-pay

## 2024-03-26 NOTE — Progress Notes (Signed)
 Specialty Pharmacy Refill Coordination Note  Regina [name] is a 83 y.o. female contacted today regarding refills of specialty medication(s) Tafamidis  Meglumine  (Cardiac) (Vyndaqel )   Patient requested Delivery   Delivery date: 03/27/24   Verified address: [address]  [place]   Medication will be filled on 03/26/24.

## 2024-03-28 ENCOUNTER — Telehealth (HOSPITAL_COMMUNITY): Payer: Self-pay | Admitting: Cardiology

## 2024-03-28 DIAGNOSIS — L2989 Other pruritus: Secondary | ICD-10-CM | POA: Diagnosis not present

## 2024-03-28 NOTE — Telephone Encounter (Signed)
 Patient called to get cardiac clearance for hand massager/glove \ reports on package insert 'if you have a history of heart problems or see a cardiologist please consult before using  Please advise

## 2024-03-28 NOTE — Telephone Encounter (Signed)
Pt aware.

## 2024-04-05 ENCOUNTER — Ambulatory Visit: Attending: Cardiovascular Disease | Admitting: *Deleted

## 2024-04-05 DIAGNOSIS — I4891 Unspecified atrial fibrillation: Secondary | ICD-10-CM | POA: Diagnosis not present

## 2024-04-05 DIAGNOSIS — I4819 Other persistent atrial fibrillation: Secondary | ICD-10-CM | POA: Diagnosis not present

## 2024-04-05 DIAGNOSIS — D6869 Other thrombophilia: Secondary | ICD-10-CM

## 2024-04-05 DIAGNOSIS — Z7901 Long term (current) use of anticoagulants: Secondary | ICD-10-CM

## 2024-04-05 LAB — POCT INR: INR: 2.9 (ref 2.0–3.0)

## 2024-04-05 NOTE — Patient Instructions (Signed)
 Description   (USE GREEN TABLET ONLY) Continue taking warfarin 1 tablet (2.5mg ) daily except for 1/2 tablet (1.25mg ) on Thursdays. Recheck INR in 4 weeks. Anticoagulation Clinic [phone]

## 2024-04-05 NOTE — Progress Notes (Cosign Needed)
Please see anticoagulation encounter.

## 2024-04-08 ENCOUNTER — Telehealth (HOSPITAL_COMMUNITY): Payer: Self-pay | Admitting: Cardiology

## 2024-04-08 NOTE — Telephone Encounter (Signed)
Pt aware.

## 2024-04-08 NOTE — Telephone Encounter (Signed)
 Patient called to report increase in belly bloating, increase in fatigue and reports she feels heavy.  Normal weight 218, weight today 223 Reports not edema or change in UOP Reports CP last night however it went away on   Reports she is taking torsemide  80 daily did not take PRN additional 40 mg   Reports she does have furoscix  at home if needed   Please advise

## 2024-04-10 ENCOUNTER — Other Ambulatory Visit (HOSPITAL_COMMUNITY): Payer: Self-pay

## 2024-04-10 DIAGNOSIS — I5022 Chronic systolic (congestive) heart failure: Secondary | ICD-10-CM

## 2024-04-10 MED ORDER — POTASSIUM CHLORIDE CRYS ER 20 MEQ PO TBCR
40.0000 meq | EXTENDED_RELEASE_TABLET | Freq: Every day | ORAL | 11 refills | Status: DC
Start: 2024-04-10 — End: 2024-05-09

## 2024-04-11 ENCOUNTER — Other Ambulatory Visit: Payer: Self-pay | Admitting: Cardiovascular Disease

## 2024-04-11 DIAGNOSIS — I4821 Permanent atrial fibrillation: Secondary | ICD-10-CM

## 2024-04-11 NOTE — Telephone Encounter (Signed)
 Prescription refill request received for warfarin Lov: 01/11/2024 Next INR check: 7/18 Warfarin tablet strength: 2.5mg 

## 2024-04-18 ENCOUNTER — Other Ambulatory Visit: Payer: Self-pay

## 2024-04-22 ENCOUNTER — Other Ambulatory Visit: Payer: Self-pay

## 2024-04-22 NOTE — Progress Notes (Signed)
 Specialty Pharmacy Refill Coordination Note  Regina [name] is a 83 y.o. female contacted today regarding refills of specialty medication(s) Tafamidis  Meglumine  (Cardiac) (Vyndaqel )   Patient requested Delivery   Delivery date: 04/23/24   Verified address: [address]  [place]   Medication will be filled on 04/22/24.

## 2024-05-03 ENCOUNTER — Ambulatory Visit

## 2024-05-07 ENCOUNTER — Ambulatory Visit: Attending: Cardiovascular Disease

## 2024-05-07 DIAGNOSIS — I4819 Other persistent atrial fibrillation: Secondary | ICD-10-CM

## 2024-05-07 DIAGNOSIS — D6869 Other thrombophilia: Secondary | ICD-10-CM

## 2024-05-07 DIAGNOSIS — Z7901 Long term (current) use of anticoagulants: Secondary | ICD-10-CM

## 2024-05-07 LAB — POCT INR: INR: 3.2 — AB (ref 2.0–3.0)

## 2024-05-07 NOTE — Progress Notes (Signed)
 Inr 3.2  Please see anticoagulation encounter

## 2024-05-07 NOTE — Patient Instructions (Signed)
(  USE GREEN TABLET ONLY)  Take 0.5 tablet tonight only then Continue taking warfarin 1 tablet (2.5mg ) daily except for 1/2 tablet (1.25mg ) on Thursdays.  Recheck INR in 4 weeks. Anticoagulation Clinic [phone]

## 2024-05-08 ENCOUNTER — Telehealth (HOSPITAL_COMMUNITY): Payer: Self-pay | Admitting: Cardiology

## 2024-05-08 ENCOUNTER — Encounter (HOSPITAL_COMMUNITY): Payer: Self-pay

## 2024-05-08 ENCOUNTER — Ambulatory Visit (HOSPITAL_COMMUNITY): Payer: Self-pay | Admitting: Physician Assistant

## 2024-05-08 ENCOUNTER — Ambulatory Visit (HOSPITAL_COMMUNITY)
Admission: RE | Admit: 2024-05-08 | Discharge: 2024-05-08 | Disposition: A | Discharge status: Home / Self Care | Source: Ambulatory Visit | Attending: Physician Assistant | Admitting: Physician Assistant

## 2024-05-08 VITALS — BP 120/68 | HR 89 | Ht 62.0 in | Wt 228.0 lb

## 2024-05-08 DIAGNOSIS — E854 Organ-limited amyloidosis: Secondary | ICD-10-CM | POA: Diagnosis not present

## 2024-05-08 DIAGNOSIS — Z6841 Body Mass Index (BMI) 40.0 and over, adult: Secondary | ICD-10-CM | POA: Diagnosis not present

## 2024-05-08 DIAGNOSIS — G63 Polyneuropathy in diseases classified elsewhere: Secondary | ICD-10-CM | POA: Insufficient documentation

## 2024-05-08 DIAGNOSIS — N184 Chronic kidney disease, stage 4 (severe): Secondary | ICD-10-CM | POA: Insufficient documentation

## 2024-05-08 DIAGNOSIS — I5022 Chronic systolic (congestive) heart failure: Secondary | ICD-10-CM | POA: Insufficient documentation

## 2024-05-08 DIAGNOSIS — I43 Cardiomyopathy in diseases classified elsewhere: Secondary | ICD-10-CM | POA: Diagnosis not present

## 2024-05-08 DIAGNOSIS — Z79899 Other long term (current) drug therapy: Secondary | ICD-10-CM | POA: Diagnosis not present

## 2024-05-08 DIAGNOSIS — E669 Obesity, unspecified: Secondary | ICD-10-CM | POA: Diagnosis not present

## 2024-05-08 DIAGNOSIS — I2510 Atherosclerotic heart disease of native coronary artery without angina pectoris: Secondary | ICD-10-CM | POA: Diagnosis not present

## 2024-05-08 DIAGNOSIS — I4821 Permanent atrial fibrillation: Secondary | ICD-10-CM | POA: Diagnosis not present

## 2024-05-08 LAB — BASIC METABOLIC PANEL WITH GFR
Anion gap: 12 (ref 5–15)
BUN: 57 mg/dL — ABNORMAL HIGH (ref 8–23)
CO2: 28 mmol/L (ref 22–32)
Calcium: 9.6 mg/dL (ref 8.9–10.3)
Chloride: 97 mmol/L — ABNORMAL LOW (ref 98–111)
Creatinine, Ser: 2.38 mg/dL — ABNORMAL HIGH (ref 0.44–1.00)
GFR, Estimated: 20 mL/min — ABNORMAL LOW (ref >60)
Glucose, Bld: 149 mg/dL — ABNORMAL HIGH (ref 70–99)
Potassium: 4.9 mmol/L (ref 3.5–5.1)
Sodium: 137 mmol/L (ref 135–145)

## 2024-05-08 LAB — BRAIN NATRIURETIC PEPTIDE: B Natriuretic Peptide: 703.1 pg/mL — ABNORMAL HIGH (ref 0.0–100.0)

## 2024-05-08 NOTE — Telephone Encounter (Signed)
 Patient called to report increase in weigt Weight to day 225, normal weight 220  Reports she feels heavy and has abdomen swelling. Increased in fatigue and feeling woozy  Report Dr Dalene recently increased torsemide  to 100 mg BID,  took 2 100mg  tablets today with a plan to take 1 100 mg tab this afternoon to help with swelling/weight gain    Please advise

## 2024-05-08 NOTE — Progress Notes (Signed)
 ADVANCED HF CLINIC NOTE  Primary Care: [place], [name], MD Primary Cardiologist: [name], MD HF Cardiologist: Dr. Cherrie  CC: HF follow up  HPI: Regina [name] is a 83 y.o. woman with PMHx as below. Referred by Dr. Decent for further evaluation of he hATTR cardiac amyloidosis   Problem List 1.  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 Chronic AF - LAA sludge vs thrombus 05/05/2020  - persistent sludge 07/01/2020 - LAA thrombus persistent 08/05/2020 - on warfarin 3. DM 4. HTN 5. Non-obstructive CAD - LHC 01/19/2015 [facility] 50% mid RCA and 50% LAD - MPI 08/14/2020 -> normal 6. OSA 7. CKD 4 (b/l Scr ~2.0) 8. CVA 11/2020 @ [facility]  9. Polymyalgia Rheumatica  10. Morbid obesity  Currently on tafamadis (started 2021) and eplonteresen (started 2/24). Seen in f/u by Dr. Decent on 01/06/2023 with volume overload He increased her torsemide  to 40/20  Echo repeated 02/03/2023 LVEF 40% with normal pressures.   She was seen in the ER 02/06/2023 for shortness of breath.  She was given IV Lasix  with good result and discharged without admission.  She was seen by Mercy Hails, PA on 02/09/23. Weight up 10 pounds despite mistakenly taking 120 mg torsemide  daily. Labs with stable Scr 2.01  Initial visit with Dr. Cherrie 5/24, NYHA III and volume up. Entresto  started and given Furoscix  for PRN home use with paramedicine support.  Seen in ED 04/11/23 with CP. HsTroponin 54>>57. No ECG changes, symptoms improved with nitroglycerin , she was started on Imdur  30 mg daily and felt stable for discharge home with close HF follow up.   Here today as an urgent visit d/t concerns for fluid retention. Her Nephrologist increased her Torsemide  to 100 mg daily a couple of months ago.  Reports that she doesn't seem to have as robust of a response to Torsemide  as she previously did. She takes an extra 100 mg Torsemide  as needed. Took an extra dose last week and again this morning. Typically weighs 220-221 lb, up to 225 lb today. No lower extremity edema but reports more dyspnea and abdominal bloating. Can't tolerate much activity d/t fatigue and shortness of breath. Had ribs a couple of days ago but otherwise tries to watch sodium intake.     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 01/2015 [facility] : + inf wall ischemia, EF 56%;  b. 01/2015 Cath: LM nl, LAD 60m, LCX min irregs, RCA dominant, 50-71m.   Sleep apnea    Swallowing  difficulty    Current Outpatient Medications  Medication Sig Dispense Refill   Accu-Chek Softclix Lancets lancets USE TO TEST BLOOD SUGAR UP TO TWO TIMES DAILY AS NEEDED 100 each 5   acetaminophen  (TYLENOL ) 500 MG tablet Take 1,000 mg by mouth every 6 (six) hours as needed for mild pain.     Alcohol  Swabs (B-D SINGLE USE SWABS REGULAR) PADS Use to test blood sugar up to 3 times daily 100 each 3   allopurinol  (ZYLOPRIM ) 100 MG tablet Take 1 tablet (100 mg total) by mouth every morning. 90 tablet 1   atorvastatin  (LIPITOR) 20 MG tablet TAKE ONE TABLET BY MOUTH EVERYDAY AT BEDTIME 90 tablet 3   Blood Glucose Monitoring Suppl (ACCU-CHEK GUIDE) w/Device KIT Use to check  blood sugars 1 kit 0   cetirizine (ZYRTEC) 10 MG chewable tablet Chew 10 mg by mouth daily.     Continuous Blood Gluc Receiver (FREESTYLE LIBRE 2 READER) DEVI Use as instructed to check blood sugar. 1 each 0   Continuous Glucose Sensor (FREESTYLE LIBRE 2 SENSOR) MISC USE TO MONITOR BLOOD SUGAR. CHANGE SENSOR EVERY 14 DAYS 6 each 3   DULoxetine  (CYMBALTA ) 30 MG capsule Take 30 mg by mouth every other day. (Patient taking differently: Take 30 mg by mouth daily.)     Eplontersen  Sodium (WAINUA ) 45 MG/0.8ML SOAJ Inject 45 mg into the skin every 30 (thirty) days. 2.4 mL 1   FARXIGA  10 MG TABS tablet Take 1 tablet (10 mg total) by mouth daily. 90 tablet 3   Fluocinolone  Acetonide 0.01 % OIL instill one drop in ears every 2-3 DAYS AS NEEDED FOR pruritis 20 mL 1   gabapentin  (NEURONTIN ) 600 MG tablet TAKE ONE TABLET BY MOUTH AT BREAKFAST AND AT BEDTIME 60 tablet 3   glucose blood (ACCU-CHEK GUIDE) test strip Use to test blood sugar up to 2 times daily as needed 100 each 3   insulin  lispro (HUMALOG  KWIKPEN) 100 UNIT/ML KwikPen Inject 4-8 Units into the skin daily as needed. Sliding scale: Max daily 30 units 30 mL 1   Insulin  Pen Needle 32G X 4 MM MISC 1 Device by Does not apply route in the morning, at noon, in the evening, and at bedtime. 400 each 3   isosorbide  mononitrate (IMDUR ) 30 MG 24 hr tablet Take 1 tablet (30 mg total) by mouth daily. 30 tablet 6   Lancets Misc. (ACCU-CHEK FASTCLIX LANCET) KIT Use to test blood sugar up to 2 times daily as needed 1 kit 1   levothyroxine  (SYNTHROID ) 150 MCG tablet TAKE ONE TABLET BY MOUTH BEFORE BREAKFAST 90 tablet 3   magnesium  oxide (MAG-OX) 400 MG tablet TAKE ONE TABLET BY MOUTH EACH MORNING 90 tablet 2   metoprolol  succinate (TOPROL -XL) 25 MG 24 hr tablet TAKE ONE TABLET BY MOUTH ONCE DAILY with OR immedaitely following A meal 90 tablet 3   Multiple Vitamin (MULTIVITAMIN) tablet Take 1 tablet by mouth daily. K FREE DAILY once daily (MVI with no vitamin K)      nitroGLYCERIN  (NITROSTAT ) 0.4 MG SL tablet DISSOLVE ONE TABLET UNDER THE TONGUE AS NEEDED FOR CHST PAIN EVERY 5 MINUTES UP TO THREE TIMES. IF NO REFILL CALL 911. 75 tablet 2   pantoprazole  (PROTONIX ) 20 MG tablet TAKE ONE TABLET BY MOUTH DAILY 60 tablet 2   Polyethylene Glycol 3350  (MIRALAX  PO) Take 1 Package by mouth daily as needed (constipation).     potassium chloride  SA (KLOR-CON  M) 20 MEQ tablet Take 2 tablets (40 mEq total)  by mouth daily. 60 tablet 11   Propylene Glycol (SYSTANE BALANCE OP) Place 1 drop into both eyes daily as needed (for dry eyes).     sacubitril -valsartan  (ENTRESTO ) 24-26 MG Take 1 tablet by mouth 2 (two) times daily. 90 tablet 3   Tafamidis  Meglumine , Cardiac, (VYNDAQEL ) 20 MG CAPS Take 4 capsules (80 mg total) by mouth daily. 120 capsule 11   tirzepatide  (MOUNJARO ) 7.5 MG/0.5ML Pen Inject 7.5 mg into the skin once a week. 6 mL 3   torsemide  (DEMADEX ) 100 MG tablet Take 100 mg by mouth daily.     trolamine salicylate (ASPERCREME) 10 % cream Apply 1 application. topically as needed for muscle pain.     warfarin (COUMADIN ) 2.5 MG tablet TAKE 1/2 TO 1 (ONE-HALF TO ONE) TABLET BY MOUTH ONCE DAILY AS  DIRECTED 100 tablet 0   No current facility-administered medications for this encounter.   Allergies  Allergen Reactions   Ace Inhibitors Other (See Comments)    Unknown reaction   Amlodipine Other (See Comments)    Unknown reaction   Atenolol Other (See Comments)    bradycardia   Avandia [Rosiglitazone] Other (See Comments)    Unknown reaction   Darvon [Propoxyphene] Other (See Comments)    Hallucinations    Erythromycin Itching   Hydralazine  Other (See Comments)    Burning in throat and chest   Hydrocodone Other (See Comments)    Hallucinations.   Levofloxacin Itching   Morphine  And Codeine Other (See Comments)    Dizzy and hallucianation, vomiting; Willing to try low dose   Percocet [Oxycodone-Acetaminophen ] Other (See Comments)    hallucination    Spironolactone Other (See Comments)    Unknown reaction   Tramadol Other (See Comments)    Unknown/does not recall reaction but does not want to take again   Social History   Socioeconomic History   Marital status: Widowed    Spouse name: Not on file   Number of children: Not on file   Years of education: Not on file   Highest education level: Associate degree: academic program  Occupational History   Occupation: Retired  Tobacco Use   Smoking status: Never   Smokeless tobacco: Never  Vaping Use   Vaping status: Never Used  Substance and Sexual Activity   Alcohol  use: No   Drug use: Never   Sexual activity: Not Currently  Other Topics Concern   Not on file  Social History Narrative   Lives with daughter   Social Drivers of Health   Financial Resource Strain: Low Risk  (03/04/2024)   Overall Financial Resource Strain (CARDIA)    Difficulty of Paying Living Expenses: Not hard at all  Food Insecurity: No Food Insecurity (03/04/2024)   Hunger Vital Sign    Worried About Running Out of Food in the Last Year: Never true    Ran Out of Food in the Last Year: Never true  Transportation Needs: No Transportation Needs (03/04/2024)   PRAPARE - Administrator, Civil Service (Medical): No    Lack of Transportation (Non-Medical): No  Physical Activity: Inactive (03/04/2024)   Exercise Vital Sign    Days of Exercise per Week: 0 days    Minutes of Exercise per Session: 0 min  Stress: No Stress Concern Present (03/04/2024)   [employer] of Occupational Health - Occupational Stress Questionnaire    Feeling of Stress : Not at all  Social Connections: Moderately Integrated (03/04/2024)   Social Connection and Isolation Panel  Frequency of Communication with Friends and Family: More than three times a week    Frequency of Social Gatherings with Friends and Family: More than three times a week    Attends Religious Services: More than 4 times per year    Active Member of  [employer] or Organizations: Yes    Attends Banker Meetings: More than 4 times per year    Marital Status: Widowed  Intimate Partner Violence: Not At Risk (03/04/2024)   Humiliation, Afraid, Rape, and Kick questionnaire    Fear of Current or Ex-Partner: No    Emotionally Abused: No    Physically Abused: No    Sexually Abused: No   Family History  Problem Relation Age of Onset   Heart disease Mother    Hypertension Mother    Cancer Father    Alcoholism Father    BP 120/68   Pulse 89   Ht 5' 2 (1.575 m)   Wt 103.4 kg (228 lb)   SpO2 98%   BMI 41.70 kg/m   Wt Readings from Last 3 Encounters:  05/08/24 103.4 kg (228 lb)  03/04/24 100.2 kg (221 lb)  01/11/24 99.8 kg (220 lb)    PHYSICAL EXAM: General:  Well appearing. No resp difficulty HEENT: normal Neck: supple. no JVD. Carotids 2+ bilat; no bruits. No lymphadenopathy or thryomegaly appreciated. Cor: PMI nondisplaced. Regular rate & rhythm. No rubs, gallops or murmurs. Lungs: clear Abdomen: soft, nontender, nondistended. No hepatosplenomegaly. No bruits or masses. Good bowel sounds. Extremities: no cyanosis, clubbing, rash, edema Neuro: alert & orientedx3, cranial nerves grossly intact. moves all 4 extremities w/o difficulty. Affect pleasant   ASSESSMENT & PLAN:  1. 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 Suspect deconditioning playing a role in some of her dyspnea. Volume okay on exam and ReDS is low at 24%. Continue Torsemide  100 mg daily. Takes and extra 100 mg in afternoon PRN. She was instructed to contact us  if needing extra Torsemide  more than 1-2 X weekly, may need intermittent metolazone. - Continue Tafamadis and eplonteresen - Continue Farxiga  10 mg daily.  - Continue  Toprol  XL 25 mg daily. - Continue Entresto  12/13 mg BID (previously reduced d/t hypotension)  - BMET/BNP today - Repeat echo - Encouraged her to increase activity level  2. CKD 4 - Baseline Scr is 2 - Continue Farxiga  - Labs today - Follows with [facility]  3. Coronary artery disease  - Non-obstructive CAD in the past.   - No ASA as she is on warfarin  - No s/s angina - Continue statin, most recent LDL 48 - Followed by Dr. Barbaraann   4. Permanent AF - Continue warfarin. Followed by Coumadin  Clinic - Previously on Eliquis  and Xarelto but switch to warfarin due to persistence of LAA clot - Zio 7 day (5/24) showed 100% AFL burden, HR 40-153 - Rate okay today  5. Obesity  - Body mass index is 41.7 kg/m. - on tirzepatide    6. 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  - No change  Follow-up 4 weeks  Chrisette Man N, PA-C  1:42 PM

## 2024-05-08 NOTE — Patient Instructions (Signed)
 There has been no changes to your medications.  Labs done today, your results will be available in MyChart, we will contact you for abnormal readings.  Your physician has requested that you have an echocardiogram. Echocardiography is a painless test that uses sound waves to create images of your heart. It provides your doctor with information about the size and shape of your heart and how well your heart's chambers and valves are working. This procedure takes approximately one hour. There are no restrictions for this procedure. Please do NOT wear cologne, perfume, aftershave, or lotions (deodorant is allowed). Please arrive 15 minutes prior to your appointment time.  Please note: We ask at that you not bring children with you during ultrasound (echo/ vascular) testing. Due to room size and safety concerns, children are not allowed in the ultrasound rooms during exams. Our front office staff cannot provide observation of children in our lobby area while testing is being conducted. An adult accompanying a patient to their appointment will only be allowed in the ultrasound room at the discretion of the ultrasound technician under special circumstances. We apologize for any inconvenience.  Your physician recommends that you schedule a follow-up appointment in: 4 weeks.  If you have any questions or concerns before your next appointment please send us  a message through [facility] or call our office at [phone].    TO LEAVE A MESSAGE FOR THE NURSE SELECT OPTION 2, PLEASE LEAVE A MESSAGE INCLUDING: YOUR NAME DATE OF BIRTH CALL BACK NUMBER REASON FOR CALL**this is important as we prioritize the call backs  YOU WILL RECEIVE A CALL BACK THE SAME DAY AS LONG AS YOU CALL BEFORE 4:00 PM  At the [facility], you and your health needs are our priority. As part of our continuing mission to provide you with exceptional heart care, we have created designated Provider Care Teams. These Care  Teams include your primary Cardiologist (physician) and Advanced Practice Providers (APPs- Physician Assistants and Nurse Practitioners) who all work together to provide you with the care you need, when you need it.   You may see any of the following providers on your designated Care Team at your next follow up: Dr Toribio Fuel Dr Ezra Shuck Dr. Ria Commander Dr. Morene Brownie Amy Lenetta, NP Caffie Shed, [place] [facility] [place], [place] Beckey Coe, NP [place] Lee, NP Ellouise Class, NP Tinnie Redman, PharmD Jaun Bash, PharmD   Please be sure to bring in all your medications bottles to every appointment.    Thank you for choosing [facility]

## 2024-05-08 NOTE — Telephone Encounter (Signed)
 Add on given to pt 7/23 @ 130

## 2024-05-08 NOTE — Telephone Encounter (Signed)
 Would offer f/u appointment given that she's had significant increase in diuretic dose recently.

## 2024-05-08 NOTE — Progress Notes (Signed)
 ReDS Vest / Clip - 05/08/24 1400       ReDS Vest / Clip   Station Marker B    Ruler Value 44    ReDS Value Range Low volume    ReDS Actual Value 24

## 2024-05-09 MED ORDER — POTASSIUM CHLORIDE CRYS ER 20 MEQ PO TBCR: 20.0000 meq | EXTENDED_RELEASE_TABLET | Freq: Every day | ORAL | Status: AC

## 2024-05-09 NOTE — Telephone Encounter (Signed)
-----   Message from [facility] [place] N sent at 05/08/2024  5:09 PM EDT ----- Labs okay other than K upper normal. Decrease KCL to 20 mEq daily ----- Message ----- From: Rebecka, Lab In [place] Sent: 05/08/2024   3:15 PM EDT To: Manuelita Nat Dutch, PA-C

## 2024-05-10 ENCOUNTER — Other Ambulatory Visit: Payer: Self-pay

## 2024-05-10 NOTE — Telephone Encounter (Signed)
 Er-en

## 2024-05-13 ENCOUNTER — Other Ambulatory Visit (HOSPITAL_COMMUNITY): Payer: Self-pay

## 2024-05-14 ENCOUNTER — Other Ambulatory Visit: Payer: Self-pay

## 2024-05-15 ENCOUNTER — Other Ambulatory Visit: Payer: Self-pay | Admitting: Family Medicine

## 2024-05-15 ENCOUNTER — Other Ambulatory Visit: Payer: Self-pay

## 2024-05-15 NOTE — Progress Notes (Signed)
 Specialty Pharmacy Refill Coordination Note  Regina [name] is a 83 y.o. female contacted today regarding refills of specialty medication(s) Tafamidis  Meglumine  (Cardiac) (Vyndaqel )   Patient requested Delivery   Delivery date: 05/17/24   Verified address: [address]  [place]   Medication will be filled on 05/16/24.

## 2024-05-24 ENCOUNTER — Ambulatory Visit: Payer: Self-pay | Admitting: *Deleted

## 2024-05-24 ENCOUNTER — Encounter: Payer: Self-pay | Admitting: Adult Health

## 2024-05-24 ENCOUNTER — Ambulatory Visit (INDEPENDENT_AMBULATORY_CARE_PROVIDER_SITE_OTHER): Admitting: Adult Health

## 2024-05-24 VITALS — BP 100/60 | HR 78 | Temp 98.0°F

## 2024-05-24 DIAGNOSIS — S76011A Strain of muscle, fascia and tendon of right hip, initial encounter: Secondary | ICD-10-CM

## 2024-05-24 DIAGNOSIS — T148XXA Other injury of unspecified body region, initial encounter: Secondary | ICD-10-CM

## 2024-05-24 DIAGNOSIS — S76011S Strain of muscle, fascia and tendon of right hip, sequela: Secondary | ICD-10-CM

## 2024-05-24 MED ORDER — CYCLOBENZAPRINE HCL 10 MG PO TABS
10.0000 mg | ORAL_TABLET | Freq: Three times a day (TID) | ORAL | 0 refills | Status: DC | PRN
Start: 2024-05-24 — End: 2024-06-06

## 2024-05-24 NOTE — Progress Notes (Addendum)
 Subjective:    Patient ID: Regina [name], female    DOB: 06/14/1941, 83 y.o.   MRN: [id number]  HPI 83 year old female who  has a past medical history of Back pain, CHF (congestive heart failure) (HCC), Chronic combined systolic and diastolic CHF (congestive heart failure) (HCC), Diabetes mellitus without complication (HCC), GERD (gastroesophageal reflux disease), Heart disease, Hypertension, Hypothyroidism, Joint pain, Mini stroke, Non-obstructive CAD, Sleep apnea, and Swallowing difficulty.  She presents to the office today with a complaint of right buttock pain.  Symptoms have been present for 1 day.  She reports sharp stabbing pain under her right buttock that comes periodically and lasts a few seconds but happens multiple times throughout the day.  Almost feels like something is clenching up.  She denies injury or trauma.  Cannot remember what she was doing when the pain started but had been doing exercises that she learned at PT prior.  She has not had any fevers or chills.  Pain does not radiate.  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 01/2015 Cardiolite : + inf wall ischemia, EF 56%;  b. 01/2015 Cath: LM nl, LAD 52m, LCX min irregs, RCA dominant, 50-40m.   Sleep apnea    Swallowing difficulty     Social History   Socioeconomic History   Marital status: Widowed    Spouse name: Not on file   Number of children: Not on file   Years of education: Not on file   Highest education level: Associate degree: academic  program  Occupational History   Occupation: Retired  Tobacco Use   Smoking status: Never   Smokeless tobacco: Never  Vaping Use   Vaping status: Never Used  Substance and Sexual Activity   Alcohol  use: No   Drug use: Never   Sexual activity: Not Currently  Other Topics Concern   Not on file  Social History Narrative   Lives with daughter   Social Drivers of Health   Financial Resource Strain: Low Risk  (03/04/2024)   Overall Financial Resource Strain (CARDIA)    Difficulty of Paying Living Expenses: Not hard at all  Food Insecurity: No Food Insecurity (03/04/2024)   Hunger Vital Sign    Worried About Running Out of Food in the Last Year: Never true    Ran Out of Food in the Last Year: Never true  Transportation Needs: No Transportation Needs (03/04/2024)   PRAPARE - Administrator, Civil Service (Medical): No    Lack of Transportation (Non-Medical): No  Physical Activity: Inactive (03/04/2024)   Exercise Vital Sign    Days of Exercise per Week: 0 days    Minutes of Exercise per Session: 0 min  Stress: No Stress Concern Present (03/04/2024)   [employer] of Occupational Health - Occupational Stress Questionnaire    Feeling of Stress : Not at all  Social Connections: Moderately Integrated (03/04/2024)   Social Connection  and Isolation Panel    Frequency of Communication with Friends and Family: More than three times a week    Frequency of Social Gatherings with Friends and Family: More than three times a week    Attends Religious Services: More than 4 times per year    Active Member of [employer] or Organizations: Yes    Attends Banker Meetings: More than 4 times per year    Marital Status: Widowed  Intimate Partner Violence: Not At Risk (03/04/2024)   Humiliation, Afraid, Rape, and Kick questionnaire    Fear of Current or Ex-Partner: No    Emotionally Abused: No    Physically Abused: No    Sexually Abused: No    Past Surgical History:  Procedure  Laterality Date   ABDOMINAL HYSTERECTOMY     BUBBLE STUDY  07/01/2020   Procedure: BUBBLE STUDY;  Surgeon: Loni Soyla LABOR, MD;  Location: [facility] ENDOSCOPY;  Service: Cardiovascular;;   BUBBLE STUDY  08/05/2020   Procedure: BUBBLE STUDY;  Surgeon: Barbaraann Darryle Ned, MD;  Location: [facility] ENDOSCOPY;  Service: Cardiovascular;;  done with definity     CARDIAC CATHETERIZATION     ESOPHAGOGASTRODUODENOSCOPY     a. 01/2015 EGD: patent esophagus.   ESOPHAGOGASTRODUODENOSCOPY N/A 01/20/2015   Procedure: ESOPHAGOGASTRODUODENOSCOPY (EGD);  Surgeon: Belvie Just, MD;  Location: [facility] ENDOSCOPY;  Service: Endoscopy;  Laterality: N/A;   HAND SURGERY     JOINT REPLACEMENT     reports history bilateral TKA and right TSA   LEFT HEART CATHETERIZATION WITH CORONARY ANGIOGRAM N/A 01/19/2015   RIGHT HEART CATH N/A 12/27/2021   Procedure: RIGHT HEART CATH;  Surgeon: Court Dorn PARAS, MD;  Location[facility] INVASIVE CV LAB;  Service: Cardiovascular;  Laterality: N/A;   TEE WITHOUT CARDIOVERSION  05/04/2020   TEE WITHOUT CARDIOVERSION N/A 05/05/2020   Procedure: TRANSESOPHAGEAL ECHOCARDIOGRAM (TEE);  Surgeon: Mona Vinie BROCKS, MD;  Location: [facility] ENDOSCOPY;  Service: Cardiovascular;  Laterality: N/A;   TEE WITHOUT CARDIOVERSION N/A 07/01/2020   Procedure: TRANSESOPHAGEAL ECHOCARDIOGRAM (TEE);  Surgeon: Loni Soyla LABOR, MD;  Location: [facility] ENDOSCOPY;  Service: Cardiovascular;  Laterality: N/A;   TEE WITHOUT CARDIOVERSION N/A 08/05/2020   Procedure: TRANSESOPHAGEAL ECHOCARDIOGRAM (TEE);  Surgeon: Barbaraann Darryle Ned, MD;  Location: [facility] ENDOSCOPY;  Service: Cardiovascular;  Laterality: N/A;   THYROIDECTOMY      Family History  Problem Relation Age of Onset   Heart disease Mother    Hypertension Mother    Cancer Father    Alcoholism Father     Allergies  Allergen Reactions   Ace Inhibitors Other (See Comments)    Unknown reaction   Amlodipine Other (See Comments)    Unknown reaction   Atenolol Other (See Comments)     bradycardia   Avandia [Rosiglitazone] Other (See Comments)    Unknown reaction   Darvon [Propoxyphene] Other (See Comments)    Hallucinations    Erythromycin Itching   Hydralazine  Other (See Comments)    Burning in throat and chest   Hydrocodone Other (See Comments)    Hallucinations.   Levofloxacin Itching   Morphine  And Codeine Other (See Comments)    Dizzy and hallucianation, vomiting; Willing to try low dose   Percocet [Oxycodone-Acetaminophen ] Other (See Comments)    hallucination   Spironolactone Other (See Comments)    Unknown reaction   Tramadol Other (See Comments)    Unknown/does not recall reaction but does not want to take again    Current Outpatient Medications on File Prior to Visit  Medication Sig  Dispense Refill   Accu-Chek Softclix Lancets lancets USE TO TEST BLOOD SUGAR UP TO TWO TIMES DAILY AS NEEDED 100 each 5   acetaminophen  (TYLENOL ) 500 MG tablet Take 1,000 mg by mouth every 6 (six) hours as needed for mild pain.     Alcohol  Swabs (B-D SINGLE USE SWABS REGULAR) PADS Use to test blood sugar up to 3 times daily 100 each 3   allopurinol  (ZYLOPRIM ) 100 MG tablet TAKE ONE TABLET BY MOUTH EACH MORNING 90 tablet 3   atorvastatin  (LIPITOR) 20 MG tablet TAKE ONE TABLET BY MOUTH EVERYDAY AT BEDTIME 90 tablet 3   Blood Glucose Monitoring Suppl (ACCU-CHEK GUIDE) w/Device KIT Use to check blood sugars 1 kit 0   cetirizine (ZYRTEC) 10 MG chewable tablet Chew 10 mg by mouth daily.     Continuous Blood Gluc Receiver (FREESTYLE LIBRE 2 READER) DEVI Use as instructed to check blood sugar. 1 each 0   Continuous Glucose Sensor (FREESTYLE LIBRE 2 SENSOR) MISC USE TO MONITOR BLOOD SUGAR. CHANGE SENSOR EVERY 14 DAYS 6 each 3   DULoxetine  (CYMBALTA ) 30 MG capsule Take 30 mg by mouth every other day. (Patient taking differently: Take 30 mg by mouth daily.)     Eplontersen  Sodium (WAINUA ) 45 MG/0.8ML SOAJ Inject 45 mg into the skin every 30 (thirty) days. 2.4 mL 1   FARXIGA  10 MG TABS  tablet Take 1 tablet (10 mg total) by mouth daily. 90 tablet 3   Fluocinolone  Acetonide 0.01 % OIL instill one drop in ears every 2-3 DAYS AS NEEDED FOR pruritis 20 mL 1   gabapentin  (NEURONTIN ) 600 MG tablet TAKE ONE TABLET BY MOUTH AT BREAKFAST AND AT BEDTIME 60 tablet 5   glucose blood (ACCU-CHEK GUIDE) test strip Use to test blood sugar up to 2 times daily as needed 100 each 3   insulin  lispro (HUMALOG  KWIKPEN) 100 UNIT/ML KwikPen Inject 4-8 Units into the skin daily as needed. Sliding scale: Max daily 30 units 30 mL 1   Insulin  Pen Needle 32G X 4 MM MISC 1 Device by Does not apply route in the morning, at noon, in the evening, and at bedtime. 400 each 3   isosorbide  mononitrate (IMDUR ) 30 MG 24 hr tablet Take 1 tablet (30 mg total) by mouth daily. 30 tablet 6   Lancets Misc. (ACCU-CHEK FASTCLIX LANCET) KIT Use to test blood sugar up to 2 times daily as needed 1 kit 1   levothyroxine  (SYNTHROID ) 150 MCG tablet TAKE ONE TABLET BY MOUTH BEFORE BREAKFAST 90 tablet 3   magnesium  oxide (MAG-OX) 400 MG tablet TAKE ONE TABLET BY MOUTH EACH MORNING 90 tablet 2   metoprolol  succinate (TOPROL -XL) 25 MG 24 hr tablet TAKE ONE TABLET BY MOUTH ONCE DAILY with OR immedaitely following A meal 90 tablet 3   Multiple Vitamin (MULTIVITAMIN) tablet Take 1 tablet by mouth daily. K FREE DAILY once daily (MVI with no vitamin K)     nitroGLYCERIN  (NITROSTAT ) 0.4 MG SL tablet DISSOLVE ONE TABLET UNDER THE TONGUE AS NEEDED FOR CHST PAIN EVERY 5 MINUTES UP TO THREE TIMES. IF NO REFILL CALL 911. 75 tablet 2   pantoprazole  (PROTONIX ) 20 MG tablet TAKE ONE TABLET BY MOUTH DAILY 60 tablet 2   Polyethylene Glycol 3350  (MIRALAX  PO) Take 1 Package by mouth daily as needed (constipation).     potassium chloride  SA (KLOR-CON  M) 20 MEQ tablet Take 1 tablet (20 mEq total) by mouth daily.     Propylene Glycol (SYSTANE BALANCE OP) Place 1  drop into both eyes daily as needed (for dry eyes).     sacubitril -valsartan  (ENTRESTO ) 24-26 MG  Take 1 tablet by mouth 2 (two) times daily. 90 tablet 3   Tafamidis  Meglumine , Cardiac, (VYNDAQEL ) 20 MG CAPS Take 4 capsules (80 mg total) by mouth daily. 120 capsule 11   tirzepatide  (MOUNJARO ) 7.5 MG/0.5ML Pen Inject 7.5 mg into the skin once a week. 6 mL 3   torsemide  (DEMADEX ) 100 MG tablet Take 100 mg by mouth daily.     trolamine salicylate (ASPERCREME) 10 % cream Apply 1 application. topically as needed for muscle pain.     warfarin (COUMADIN ) 2.5 MG tablet TAKE 1/2 TO 1 (ONE-HALF TO ONE) TABLET BY MOUTH ONCE DAILY AS  DIRECTED 100 tablet 0   No current facility-administered medications on file prior to visit.    BP 100/60   Pulse 78   Temp 98 F (36.7 C) (Oral)   SpO2 96%       Objective:   Physical Exam Vitals and nursing note reviewed.  Constitutional:      Appearance: Normal appearance.  Cardiovascular:     Rate and Rhythm: Normal rate and regular rhythm.     Pulses: Normal pulses.     Heart sounds: Normal heart sounds.  Pulmonary:     Effort: Pulmonary effort is normal.     Breath sounds: Normal breath sounds.  Musculoskeletal:        General: Tenderness present.       Legs:  Skin:    General: Skin is warm and dry.  Neurological:     General: No focal deficit present.     Mental Status: She is alert and oriented to person, place, and time.  Psychiatric:        Mood and Affect: Mood normal.        Behavior: Behavior normal.        Judgment: Judgment normal.        Assessment & Plan:  1. Muscle strain of gluteal region, right, sequela - Will send in muscle relaxer. She is aware of sedating effect and was advised to use at nighttime.  - Follow up if not improving in the next 3-4 days  - cyclobenzaprine  (FLEXERIL ) 10 MG tablet; Take 1 tablet (10 mg total) by mouth 3 (three) times daily as needed for muscle spasms.  Dispense: 15 tablet; Refill: 0  Darleene Shape, NP

## 2024-05-24 NOTE — Patient Instructions (Signed)
 It was great seeing you today   I think you have a muscle strain. I have sent in a muscle relaxer for you. This can make you sleepy so take it at night   Let us  know if you are not any better in the 3-4 days

## 2024-05-24 NOTE — Telephone Encounter (Signed)
 FYI Only or Action Required?: FYI only for provider.  Patient was last seen in primary care on 01/09/2024 by [place], [name], MD.  Called Nurse Triage reporting Pain.  Symptoms began yesterday.  Interventions attempted: OTC medications: tylenol  no relief.  Symptoms are: rapidly worsening.  Triage Disposition: See HCP Within 4 Hours (Or PCP Triage)  Patient/caregiver understands and will follow disposition?: Yes                Copied from CRM #[id number]. Topic: Clinical - Red Word Triage >> May 24, 2024  9:48 AM Jayma L wrote: Red Word that prompted transfer to Nurse Triage: patient called in stated she is having pain under her right butt cheek going down leg . She said it feels like lighting is running through it and she's having a hard time standing up and walking... nothing is helping ad the pain started last night Reason for Disposition  [1] Thigh or calf pain AND [2] only 1 side AND [3] present > 1 hour  (Exception: Chronic unchanged pain.)    Right under buttocks pain  Answer Assessment - Initial Assessment Questions Appt scheduled today with other provider. None available with PCP. Recommended if pain worsening and unable to walk go to ED     1. ONSET: When did the pain start?      Last night  2. LOCATION: Where is the pain located?      Under right buttocks 3. PAIN: How bad is the pain?    (Scale 1-10; or mild, moderate, severe)     Moderate can be severe at times. Constant pain  4. WORK OR EXERCISE: Has there been any recent work or exercise that involved this part of the body?      Exercising stepping side to side  5. CAUSE: What do you think is causing the leg pain?     Not sure  6. OTHER SYMPTOMS: Do you have any other symptoms? (e.g., chest pain, back pain, breathing difficulty, swelling, rash, fever, numbness, weakness)    Right buttocks sharp pain, like lightning constant  last night not sleeping constant pain moderate laying down severe  walking  7. PREGNANCY: Is there any chance you are pregnant? When was your last menstrual period?     na  Protocols used: Leg Pain-A-AH

## 2024-05-27 ENCOUNTER — Telehealth: Payer: Self-pay

## 2024-05-27 ENCOUNTER — Other Ambulatory Visit: Payer: Self-pay | Admitting: Internal Medicine

## 2024-05-27 ENCOUNTER — Telehealth: Payer: Self-pay | Admitting: Internal Medicine

## 2024-05-27 DIAGNOSIS — E1140 Type 2 diabetes mellitus with diabetic neuropathy, unspecified: Secondary | ICD-10-CM

## 2024-05-27 NOTE — Telephone Encounter (Signed)
 MEDICATION: Freestyle Libre 2 Reader  PHARMACY:   [facility] - [place], [place] - [address] [place], [place] [address] Phone: [phone]  Fax: [phone]    HAS THE PATIENT CONTACTED THEIR PHARMACY?  Yes  LAST REFILL:  @@LASTREFILL @  IS THIS A 90 DAY SUPPLY :   IS PATIENT OUT OF MEDICATION:   IF NOT; HOW MUCH IS LEFT:   LAST APPOINTMENT DATE: @8 /08/2024  NEXT APPOINTMENT DATE:@9 /11/2023  DO WE HAVE YOUR PERMISSION TO LEAVE A DETAILED MESSAGE?:  OTHER COMMENTS:    **Let patient know to contact pharmacy at the end of the day to make sure medication is ready. **  ** Please notify patient to allow 48-72 hours to process**  **Encourage patient to contact the pharmacy for refills or they can request refills through [facility]**

## 2024-05-27 NOTE — Telephone Encounter (Signed)
Contact patient to schedule follow up appointment

## 2024-05-28 ENCOUNTER — Telehealth: Payer: Self-pay

## 2024-05-28 NOTE — Telephone Encounter (Signed)
 CMN faxed back to [facility] on 05/24/2024

## 2024-06-03 ENCOUNTER — Telehealth (HOSPITAL_COMMUNITY): Payer: Self-pay

## 2024-06-03 NOTE — Telephone Encounter (Signed)
 Called to confirm/remind patient of their appointment [facility] on 06/04/24.   Appointment:   [x] Confirmed  [] Left mess   [] No answer/No voice mail  [] VM Full/unable to leave message  [] Phone not in service  Patient reminded to bring all medications and/or complete list.  Confirmed patient has transportation. Gave directions, instructed to utilize valet parking.

## 2024-06-03 NOTE — Progress Notes (Signed)
 ADVANCED HF CLINIC NOTE  Primary Care: [place], [name], MD Primary Cardiologist: [name], MD HF Cardiologist: Dr. Cherrie  CC: HF follow up  HPI: Regina [name] is a 83 y.o. woman with PMHx as below. Referred by Dr. Decent for further evaluation of he hATTR cardiac amyloidosis   Problem List 1.  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 Chronic AF - LAA sludge vs thrombus 05/05/2020  - persistent sludge 07/01/2020 - LAA thrombus persistent 08/05/2020 - on warfarin 3. DM 4. HTN 5. Non-obstructive CAD - LHC 01/19/2015 [facility] [place] 50% mid RCA and 50% LAD - MPI 08/14/2020 -> normal 6. OSA 7. CKD 4 (b/l Scr ~2.0) 8. CVA 11/2020 @ [facility]  9. Polymyalgia Rheumatica  10. Morbid obesity  Currently on tafamadis (started 2021) and eplonteresen (started 2/24). Seen in f/u by Dr. Decent on 01/06/2023 with volume overload He increased her torsemide  to 40/20  Echo repeated 02/03/2023 LVEF 40% with normal pressures.   She was seen in the ER 02/06/2023 for shortness of breath.  She was given IV Lasix  with good result and discharged without admission.  She was seen by Mercy Hails, PA on 02/09/23. Weight up 10 pounds despite mistakenly taking 120 mg torsemide  daily. Labs with stable Scr 2.01  Initial visit with Dr. Cherrie 5/24, NYHA III and volume up. Entresto  started and given Furoscix  for PRN home use with paramedicine support.  Seen in ED 04/11/23 with CP. HsTroponin 54>>57. No ECG changes, symptoms improved with nitroglycerin , she was started on Imdur  30 mg daily and felt stable for discharge home with close HF follow up.   Here today as an urgent visit d/t concerns for fluid retention. Her Nephrologist increased her Torsemide  to 100 mg daily a couple of months ago.  Reports that she doesn't seem to have as robust of a response to Torsemide  as she previously did. She takes an extra 100 mg Torsemide  as needed. Took an extra dose last week and again this morning. Typically weighs 220-221 lb, up to 225 lb today. No lower extremity edema but reports more dyspnea and abdominal bloating. Can't tolerate much activity d/t fatigue and shortness of breath. Had ribs a couple of days ago but otherwise tries to watch sodium intake.     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 01/2015 [facility] : + inf wall ischemia, EF 56%;  b. 01/2015 Cath: LM nl, LAD 75m, LCX min irregs, RCA dominant, 50-62m.   Sleep apnea    Swallowing  difficulty    Current Outpatient Medications  Medication Sig Dispense Refill   Accu-Chek Softclix Lancets lancets USE TO TEST BLOOD SUGAR UP TO TWO TIMES DAILY AS NEEDED 100 each 5   acetaminophen  (TYLENOL ) 500 MG tablet Take 1,000 mg by mouth every 6 (six) hours as needed for mild pain.     Alcohol  Swabs (B-D SINGLE USE SWABS REGULAR) PADS Use to test blood sugar up to 3 times daily 100 each 3   allopurinol  (ZYLOPRIM ) 100 MG tablet TAKE ONE TABLET BY MOUTH EACH MORNING 90 tablet 3   atorvastatin  (LIPITOR) 20 MG tablet TAKE ONE TABLET BY MOUTH EVERYDAY AT BEDTIME 90 tablet 3   Blood Glucose Monitoring Suppl (ACCU-CHEK GUIDE) w/Device KIT Use to check blood sugars 1  kit 0   cetirizine (ZYRTEC) 10 MG chewable tablet Chew 10 mg by mouth daily.     Continuous Glucose Receiver (FREESTYLE LIBRE 2 READER) DEVI USE TO TEST BLOOD SUGAR AS DIRECTED 1 each 0   Continuous Glucose Sensor (FREESTYLE LIBRE 2 SENSOR) MISC USE TO MONITOR BLOOD SUGAR. CHANGE SENSOR EVERY 14 DAYS 6 each 3   cyclobenzaprine  (FLEXERIL ) 10 MG tablet Take 1 tablet (10 mg total) by mouth 3 (three) times daily as needed for muscle spasms. 15 tablet 0   DULoxetine  (CYMBALTA ) 30 MG capsule Take 30 mg by mouth every other day. (Patient taking differently: Take 30 mg by mouth daily.)     Eplontersen  Sodium (WAINUA ) 45 MG/0.8ML SOAJ Inject 45 mg into the skin every 30 (thirty) days. 2.4 mL 1   FARXIGA  10 MG TABS tablet Take 1 tablet (10 mg total) by mouth daily. 90 tablet 3   Fluocinolone  Acetonide 0.01 % OIL instill one drop in ears every 2-3 DAYS AS NEEDED FOR pruritis 20 mL 1   gabapentin  (NEURONTIN ) 600 MG tablet TAKE ONE TABLET BY MOUTH AT BREAKFAST AND AT BEDTIME 60 tablet 5   glucose blood (ACCU-CHEK GUIDE) test strip Use to test blood sugar up to 2 times daily as needed 100 each 3   insulin  lispro (HUMALOG  KWIKPEN) 100 UNIT/ML KwikPen Inject 4-8 Units into the skin daily as needed. Sliding scale: Max daily 30 units 30 mL 1   Insulin  Pen Needle 32G X 4 MM MISC 1 Device by Does not apply route in the morning, at noon, in the evening, and at bedtime. 400 each 3   isosorbide  mononitrate (IMDUR ) 30 MG 24 hr tablet Take 1 tablet (30 mg total) by mouth daily. 30 tablet 6   Lancets Misc. (ACCU-CHEK FASTCLIX LANCET) KIT Use to test blood sugar up to 2 times daily as needed 1 kit 1   levothyroxine  (SYNTHROID ) 150 MCG tablet TAKE ONE TABLET BY MOUTH BEFORE BREAKFAST 90 tablet 3   magnesium  oxide (MAG-OX) 400 MG tablet TAKE ONE TABLET BY MOUTH EACH MORNING 90 tablet 2   metoprolol  succinate (TOPROL -XL) 25 MG 24 hr tablet TAKE ONE TABLET BY MOUTH ONCE DAILY with OR immedaitely following A meal 90 tablet 3    Multiple Vitamin (MULTIVITAMIN) tablet Take 1 tablet by mouth daily. K FREE DAILY once daily (MVI with no vitamin K)     nitroGLYCERIN  (NITROSTAT ) 0.4 MG SL tablet DISSOLVE ONE TABLET UNDER THE TONGUE AS NEEDED FOR CHST PAIN EVERY 5 MINUTES UP TO THREE TIMES. IF NO REFILL CALL 911. 75 tablet 2   pantoprazole  (PROTONIX ) 20 MG tablet TAKE ONE TABLET BY MOUTH DAILY 60 tablet 2   Polyethylene Glycol 3350  (MIRALAX  PO) Take 1 Package by  mouth daily as needed (constipation).     potassium chloride  SA (KLOR-CON  M) 20 MEQ tablet Take 1 tablet (20 mEq total) by mouth daily.     Propylene Glycol (SYSTANE BALANCE OP) Place 1 drop into both eyes daily as needed (for dry eyes).     sacubitril -valsartan  (ENTRESTO ) 24-26 MG Take 1 tablet by mouth 2 (two) times daily. 90 tablet 3   Tafamidis  Meglumine , Cardiac, (VYNDAQEL ) 20 MG CAPS Take 4 capsules (80 mg total) by mouth daily. 120 capsule 11   tirzepatide  (MOUNJARO ) 7.5 MG/0.5ML Pen Inject 7.5 mg into the skin once a week. 6 mL 3   torsemide  (DEMADEX ) 100 MG tablet Take 100 mg by mouth daily.     trolamine salicylate (ASPERCREME) 10 % cream Apply 1 application. topically as needed for muscle pain.     warfarin (COUMADIN ) 2.5 MG tablet TAKE 1/2 TO 1 (ONE-HALF TO ONE) TABLET BY MOUTH ONCE DAILY AS  DIRECTED 100 tablet 0   No current facility-administered medications for this visit.   Allergies  Allergen Reactions   Ace Inhibitors Other (See Comments)    Unknown reaction   Amlodipine Other (See Comments)    Unknown reaction   Atenolol Other (See Comments)    bradycardia   Avandia [Rosiglitazone] Other (See Comments)    Unknown reaction   Darvon [Propoxyphene] Other (See Comments)    Hallucinations    Erythromycin Itching   Hydralazine  Other (See Comments)    Burning in throat and chest   Hydrocodone Other (See Comments)    Hallucinations.   Levofloxacin Itching   Morphine  And Codeine Other (See Comments)    Dizzy and hallucianation, vomiting; Willing  to try low dose   Percocet [Oxycodone-Acetaminophen ] Other (See Comments)    hallucination   Spironolactone Other (See Comments)    Unknown reaction   Tramadol Other (See Comments)    Unknown/does not recall reaction but does not want to take again   Social History   Socioeconomic History   Marital status: Widowed    Spouse name: Not on file   Number of children: Not on file   Years of education: Not on file   Highest education level: Associate degree: academic program  Occupational History   Occupation: Retired  Tobacco Use   Smoking status: Never   Smokeless tobacco: Never  Vaping Use   Vaping status: Never Used  Substance and Sexual Activity   Alcohol  use: No   Drug use: Never   Sexual activity: Not Currently  Other Topics Concern   Not on file  Social History Narrative   Lives with daughter   Social Drivers of Health   Financial Resource Strain: Low Risk  (03/04/2024)   Overall Financial Resource Strain (CARDIA)    Difficulty of Paying Living Expenses: Not hard at all  Food Insecurity: No Food Insecurity (03/04/2024)   Hunger Vital Sign    Worried About Running Out of Food in the Last Year: Never true    Ran Out of Food in the Last Year: Never true  Transportation Needs: No Transportation Needs (03/04/2024)   PRAPARE - Administrator, Civil Service (Medical): No    Lack of Transportation (Non-Medical): No  Physical Activity: Inactive (03/04/2024)   Exercise Vital Sign    Days of Exercise per Week: 0 days    Minutes of Exercise per Session: 0 min  Stress: No Stress Concern Present (03/04/2024)   [employer] of Occupational Health - Occupational Stress Questionnaire  Feeling of Stress : Not at all  Social Connections: Moderately Integrated (03/04/2024)   Social Connection and Isolation Panel    Frequency of Communication with Friends and Family: More than three times a week    Frequency of Social Gatherings with Friends and Family: More than three  times a week    Attends Religious Services: More than 4 times per year    Active Member of [employer] or Organizations: Yes    Attends Banker Meetings: More than 4 times per year    Marital Status: Widowed  Intimate Partner Violence: Not At Risk (03/04/2024)   Humiliation, Afraid, Rape, and Kick questionnaire    Fear of Current or Ex-Partner: No    Emotionally Abused: No    Physically Abused: No    Sexually Abused: No   Family History  Problem Relation Age of Onset   Heart disease Mother    Hypertension Mother    Cancer Father    Alcoholism Father    There were no vitals taken for this visit.  Wt Readings from Last 3 Encounters:  05/08/24 103.4 kg (228 lb)  03/04/24 100.2 kg (221 lb)  01/11/24 99.8 kg (220 lb)    PHYSICAL EXAM: General:  Well appearing. No resp difficulty HEENT: normal Neck: supple. no JVD. Carotids 2+ bilat; no bruits. No lymphadenopathy or thryomegaly appreciated. Cor: PMI nondisplaced. Regular rate & rhythm. No rubs, gallops or murmurs. Lungs: clear Abdomen: soft, nontender, nondistended. No hepatosplenomegaly. No bruits or masses. Good bowel sounds. Extremities: no cyanosis, clubbing, rash, edema Neuro: alert & orientedx3, cranial nerves grossly intact. moves all 4 extremities w/o difficulty. Affect pleasant   ASSESSMENT & PLAN:  1. 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 Suspect deconditioning playing a role in some of her dyspnea. Volume okay on exam and ReDS is low at 24%. Continue Torsemide  100 mg daily. Takes and extra 100 mg in afternoon PRN. She was instructed to contact us  if needing extra Torsemide  more than 1-2 X weekly, may need intermittent metolazone. - Continue Tafamadis and eplonteresen -  Continue Farxiga  10 mg daily.  - Continue Toprol  XL 25 mg daily. - Continue Entresto  12/13 mg BID (previously reduced d/t hypotension)  - BMET/BNP today - Repeat echo - Encouraged her to increase activity level  2. CKD 4 - Baseline Scr is 2 - Continue Farxiga  - Labs today - Follows with [facility]  3. Coronary artery disease  - Non-obstructive CAD in the past.   - No ASA as she is on warfarin  - No s/s angina - Continue statin, most recent LDL 48 - Followed by Dr. Barbaraann   4. Permanent AF - Continue warfarin. Followed by Coumadin  Clinic - Previously on Eliquis  and Xarelto but switch to warfarin due to persistence of LAA clot - Zio 7 day (5/24) showed 100% AFL burden, HR 40-153 - Rate okay today  5. Obesity  - There is no height or weight on file to calculate BMI. - on tirzepatide    6. 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  - No change  Follow-up 4 weeks  Harlene CHRISTELLA Gainer, FNP  1:27 PM

## 2024-06-04 ENCOUNTER — Other Ambulatory Visit (HOSPITAL_COMMUNITY): Payer: Self-pay

## 2024-06-04 ENCOUNTER — Ambulatory Visit (HOSPITAL_BASED_OUTPATIENT_CLINIC_OR_DEPARTMENT_OTHER)
Admission: RE | Admit: 2024-06-04 | Discharge: 2024-06-04 | Disposition: A | Discharge status: Home / Self Care | Source: Ambulatory Visit | Attending: Family Medicine | Admitting: Family Medicine

## 2024-06-04 ENCOUNTER — Ambulatory Visit (HOSPITAL_COMMUNITY)
Admission: RE | Admit: 2024-06-04 | Discharge: 2024-06-04 | Disposition: A | Discharge status: Home / Self Care | Source: Ambulatory Visit | Attending: Internal Medicine | Admitting: Internal Medicine

## 2024-06-04 ENCOUNTER — Encounter (HOSPITAL_COMMUNITY): Payer: Self-pay

## 2024-06-04 VITALS — BP 126/72 | HR 95 | Ht 62.0 in | Wt 228.2 lb

## 2024-06-04 DIAGNOSIS — I4819 Other persistent atrial fibrillation: Secondary | ICD-10-CM

## 2024-06-04 DIAGNOSIS — I340 Nonrheumatic mitral (valve) insufficiency: Secondary | ICD-10-CM | POA: Diagnosis not present

## 2024-06-04 DIAGNOSIS — H43392 Other vitreous opacities, left eye: Secondary | ICD-10-CM | POA: Diagnosis not present

## 2024-06-04 DIAGNOSIS — N184 Chronic kidney disease, stage 4 (severe): Secondary | ICD-10-CM

## 2024-06-04 DIAGNOSIS — I2510 Atherosclerotic heart disease of native coronary artery without angina pectoris: Secondary | ICD-10-CM

## 2024-06-04 DIAGNOSIS — I110 Hypertensive heart disease with heart failure: Secondary | ICD-10-CM | POA: Insufficient documentation

## 2024-06-04 DIAGNOSIS — M353 Polymyalgia rheumatica: Secondary | ICD-10-CM | POA: Diagnosis not present

## 2024-06-04 DIAGNOSIS — I5022 Chronic systolic (congestive) heart failure: Secondary | ICD-10-CM

## 2024-06-04 DIAGNOSIS — G629 Polyneuropathy, unspecified: Secondary | ICD-10-CM

## 2024-06-04 DIAGNOSIS — E119 Type 2 diabetes mellitus without complications: Secondary | ICD-10-CM | POA: Diagnosis not present

## 2024-06-04 DIAGNOSIS — H04123 Dry eye syndrome of bilateral lacrimal glands: Secondary | ICD-10-CM | POA: Diagnosis not present

## 2024-06-04 DIAGNOSIS — H16143 Punctate keratitis, bilateral: Secondary | ICD-10-CM | POA: Diagnosis not present

## 2024-06-04 LAB — ECHOCARDIOGRAM COMPLETE
Area-P 1/2: 8.07 cm2
Calc EF: 39.4 %
S' Lateral: 3.3 cm
Single Plane A2C EF: 35.8 %
Single Plane A4C EF: 46.4 %

## 2024-06-04 LAB — HM DIABETES EYE EXAM

## 2024-06-04 NOTE — Progress Notes (Signed)
 Medication Samples have been provided to the patient.  Drug name: Furoscix        Strength: 80 mg        Qty: 3  LOT: [id number]  Exp.Date: 09/15/2025  Dosing instructions: Take as directed by clinic  The patient has been instructed regarding the correct time, dose, and frequency of taking this medication, including desired effects and most common side effects.   Keene HERO Jeraldine Primeau 4:12 PM 06/04/2024

## 2024-06-04 NOTE — Progress Notes (Signed)
 ReDS Vest / Clip - 06/04/24 1521       ReDS Vest / Clip   Station Marker A    Ruler Value 33    ReDS Value Range Low volume    ReDS Actual Value 35

## 2024-06-04 NOTE — Patient Instructions (Addendum)
 Good to see you today! Your provider has order Furoscix  for you. This is an on-body infuser that gives you a dose of Furosemide .   It will be shipped to your home from [facility], they will call you before shipping  Ensure you write down the time you start your infusion so that if there is a problem you will know how long the infusion lasted  Use Furoscix  only AS DIRECTED by our office  Dosing Directions:   Day 1= Wednesday 1 kit and 40 meq potassium( 2 tablets)  Day 2= Thursday 1 Kit and 40 meq potassium 2 tablets)  Day 3= Friday  1 kit and 40 meq(2 tablets)   NO Torsemide  during taking kits  Saturday take torsemide   100 mg  daily and potassium 20 meq daily  Labs done today, your results will be available in MyChart, we will contact you for abnormal readings.  Your physician recommends that you schedule a follow-up appointment as scheduled  If you have any questions or concerns before your next appointment please send us  a message through Why or call our office at [phone].    TO LEAVE A MESSAGE FOR THE NURSE SELECT OPTION 2, PLEASE LEAVE A MESSAGE INCLUDING: YOUR NAME DATE OF BIRTH CALL BACK NUMBER REASON FOR CALL**this is important as we prioritize the call backs  YOU WILL RECEIVE A CALL BACK THE SAME DAY AS LONG AS YOU CALL BEFORE 4:00 PM At the [facility], you and your health needs are our priority. As part of our continuing mission to provide you with exceptional heart care, we have created designated Provider Care Teams. These Care Teams include your primary Cardiologist (physician) and Advanced Practice Providers (APPs- Physician Assistants and Nurse Practitioners) who all work together to provide you with the care you need, when you need it.   You may see any of the following providers on your designated Care Team at your next follow up: Dr Toribio Fuel Dr Ezra Shuck Dr. Ria Commander Dr. Morene Brownie Amy  [place], NP Caffie Shed, [place] [facility], [place] Beckey Coe, NP [place] Lee, NP Ellouise Class, NP Tinnie Redman, PharmD Jaun Bash, PharmD   Please be sure to bring in all your medications bottles to every appointment.    Thank you for choosing [facility]

## 2024-06-04 NOTE — Progress Notes (Signed)
 Provided patient education on Furoscix using demo kits and Furoscix video, QR code provided on AVS for further viewing. Furoscix order submitted online, ov note and ins card uploaded to [employer].

## 2024-06-05 ENCOUNTER — Other Ambulatory Visit (HOSPITAL_COMMUNITY): Payer: Self-pay

## 2024-06-05 ENCOUNTER — Telehealth (HOSPITAL_COMMUNITY): Payer: Self-pay

## 2024-06-05 ENCOUNTER — Other Ambulatory Visit: Payer: Self-pay

## 2024-06-05 MED ORDER — FUROSCIX 80 MG/10ML ~~LOC~~ CTKT: 80.0000 mg | CARTRIDGE | Freq: Every day | SUBCUTANEOUS | Status: AC

## 2024-06-05 MED ORDER — FUROSCIX 80 MG/10ML ~~LOC~~ CTKT
80.0000 mg | CARTRIDGE | Freq: Every day | SUBCUTANEOUS | 0 refills | Status: DC
  Filled 2024-06-05: qty 3, 3d supply, fill #0

## 2024-06-05 NOTE — Addendum Note (Signed)
 Encounter addended by: Bernal Luhman M, RN on: 06/05/2024 3:10 PM  Actions taken: Pharmacy for encounter modified, Order list changed

## 2024-06-05 NOTE — Telephone Encounter (Signed)
 Prior authorization for Furoscix  has been submitted and approved. Test billing returns $0 for 3 day supply.  KeyBETHA LEVENTHAL Effective: 06/05/2024 to 10/16/2024  Rachel DEL, CPhT [facility] Phone: [phone]

## 2024-06-05 NOTE — Addendum Note (Signed)
 Encounter addended by: Erroll Wilbourne M, RN on: 06/05/2024 8:21 AM  Actions taken: Order list changed

## 2024-06-05 NOTE — Progress Notes (Signed)
 Specialty Pharmacy Initial Fill Coordination Note  Regina [name] is a 83 y.o. female contacted today regarding initial fill of specialty medication(s) Furosemide  (Furoscix )   Patient requested Delivery   Delivery date: 06/07/24   Verified address: [address] [place] [address]   Medication will be filled on 06/07/2024.   Patient is aware of $0 copayment. This is a one time fill.

## 2024-06-06 ENCOUNTER — Other Ambulatory Visit: Payer: Self-pay

## 2024-06-06 ENCOUNTER — Other Ambulatory Visit: Payer: Self-pay | Admitting: Family Medicine

## 2024-06-06 ENCOUNTER — Ambulatory Visit: Attending: Cardiovascular Disease | Admitting: *Deleted

## 2024-06-06 ENCOUNTER — Encounter: Payer: Self-pay | Admitting: Neurology

## 2024-06-06 ENCOUNTER — Ambulatory Visit: Admitting: Neurology

## 2024-06-06 VITALS — BP 111/65 | HR 93 | Ht 63.0 in | Wt 224.0 lb

## 2024-06-06 DIAGNOSIS — G8929 Other chronic pain: Secondary | ICD-10-CM

## 2024-06-06 DIAGNOSIS — I4819 Other persistent atrial fibrillation: Secondary | ICD-10-CM

## 2024-06-06 DIAGNOSIS — M542 Cervicalgia: Secondary | ICD-10-CM

## 2024-06-06 DIAGNOSIS — D6869 Other thrombophilia: Secondary | ICD-10-CM

## 2024-06-06 DIAGNOSIS — R202 Paresthesia of skin: Secondary | ICD-10-CM | POA: Diagnosis not present

## 2024-06-06 DIAGNOSIS — R200 Anesthesia of skin: Secondary | ICD-10-CM | POA: Diagnosis not present

## 2024-06-06 DIAGNOSIS — I4891 Unspecified atrial fibrillation: Secondary | ICD-10-CM

## 2024-06-06 DIAGNOSIS — M5481 Occipital neuralgia: Secondary | ICD-10-CM | POA: Diagnosis not present

## 2024-06-06 DIAGNOSIS — Z7901 Long term (current) use of anticoagulants: Secondary | ICD-10-CM | POA: Diagnosis not present

## 2024-06-06 DIAGNOSIS — R29898 Other symptoms and signs involving the musculoskeletal system: Secondary | ICD-10-CM | POA: Diagnosis not present

## 2024-06-06 LAB — POCT INR: INR: 2.6 (ref 2.0–3.0)

## 2024-06-06 NOTE — Patient Instructions (Signed)
 Description   (USE GREEN TABLET ONLY)  INR-2.6; Continue taking warfarin 1 tablet (2.5mg ) daily except for 1/2 tablet (1.25mg ) on Thursdays. Recheck INR in 4 weeks. Anticoagulation Clinic [phone]

## 2024-06-06 NOTE — Progress Notes (Signed)
 NEUROLOGY CONSULTATION NOTE  KAMEREN BAADE MRN: [id number] DOB: 1941/04/10  Referring provider: Betty Swaziland, MD Primary care provider: Betty Swaziland, MD  Reason for consult:  headache, numbness, tremors  Assessment/Plan:   Left sided occipital neuralgia Numbness and weakness of upper extremities.  She has chronic neck pain with history of cervical spine fusion - most recent MRI of C-spine from 2022 did not reveal any structural explanation but as symptoms started at least a year after this MRI, would evaluate for recurrent spinal or bilateral foraminal stenosis.  Carpal tunnel syndrome also possible - she does have remote history of CTS as well. Tremor - unclear etiology.  Semiology not consistent with essential tremor and she is not parkinsonian.  Denies association with pain or anxiety.     For treatment of occipital neuralgia: Continue duloxetine  30mg  daily.  If no improvement in 6 weeks, she is to contact me.  We may consider increasing to 60mg  daily Other treatment options include physical therapy of neck (pending cervical MRI results) or occipital nerve block For further evaluation of bilateral upper extremity weakness and numbness: Due to prior cervical spinal stenosis, would check MRI of cervical spine.  If there isn't any explainable cause, would follow up with NCV-EMG of upper extremities. Regarding tremor, advised to record it on phone next time she has an episode and bring it with her to next visit for my review. Further recommendations pending results.  Otherwise follow up in 6 months.  Total time spent on today's visit was 61 minutes dedicated to this patient today, preparing to see patient, examining the patient, ordering tests and/or medications and counseling the patient, documenting clinical information in the EHR or other health record, independently interpreting results and communicating results to the patient/family, discussing treatment and goals, answering  patient's questions and coordinating care.    Subjective:  Regina [name] is an 83 year old right-handed female with CHF, HTN, DM 2, non-obstructive CAD, GERD, sleep apnea and history of CVA who presents for headache, tremors and numbness.  History supplemented by her accompanying son and her referring provider's note.  MRI brain personally reviewed.  Headaches: Started having headaches in 2024.  Severe shooting pain from the left occipital region radiating up to the left frontal region and across to the right frontal region.  Lasts seconds to minutes.  No associated nausea, vomiting, photophobia, phonophobia, visual disturbance.  Occurrence is variable.  Occurs maybe occur several days in a row and may have 3 to 4 weeks without an episode.  It occurs spontaneously, not associated with neck movement or specific neck position.  No scalp dysesthesia or allodynia.  She has chronic neck pain with ongoing left sided neck pain and history of C3-C4 ACDF.    Tremors: Started about 2 years ago but have progressively gotten worse.  Involves arms and hands, sometimes diffuse.  She says it can occur spontaneously when hands resting on lap.  Lasts 30-60 minutes.  Happens any time of day and occurs every other day.  It does occur when she is writing or if her arm is stretched out to check blood pressure.  No family history of tremor.  Polyneuropathy: Involves hands and feet.  On gabapentin .  She has diabetes.   10/06/2023: Hgb A1c 6.3 07/12/2023:  B12 769, TSH 0.64, free T4 1.03   Weakness: She reports weakness and numbness in her hands for several years.  Numbness involves the first 3 digits of both hands.  She has pain radiating down  the arms as well.  She endorses difficulty gripping and picking up objects.  History of C3-4 ACDF with continued chronic neck pain but repeat imaging did not reveal any compressive lesion.  Imaging: 09/07/2023 MRI BRAIN WO:  1. No evidence of an acute intracranial  abnormality. 2. Parenchymal atrophy, chronic small vessel ischemic disease and chronic infarcts (small cortical infarct within right precentral gyrus, chronic left thalamic lacunar infarct, small infarct within the superior right cerebellar hemisphere). 3. Nonspecific punctate chronic microhemorrhages, one within the right cerebellar hemisphere and the other at the anterior left insula. 01/04/2022 CT C-SPINE WO:  C3-C4 ACDF with solid arthrodesis.  1. No evidence of an acute intracranial abnormality. 2. Parenchymal atrophy, chronic small vessel ischemic disease and chronic infarcts as described. 3. Nonspecific punctate chronic microhemorrhages, one within the right cerebellar hemisphere and the other at the anterior left insula. 07/06/2021 MRI C-SPINE WO:  MRI scan cervical spine without contrast showing stable postoperative changes of anterior cervical fusion at C3-C4 with minor disc degenerative changes but no significant compression.    Past NSAIDS/analgesics:  none Past abortive triptans:  none Past abortive ergotamine:  none Past muscle relaxants:  tizanidine  Past anti-emetic:  Zofran  4mg  Past antihypertensive medications:  losartan , furosemide  Past antidepressant medications:  none Past anticonvulsant medications:  none Past anti-CGRP:  none Past vitamins/Herbal/Supplements:  none Past antihistamines/decongestants:  Zyrtec, Claritin  Other past therapies:  none  Current NSAIDS/analgesics:  acetaminophen  Current triptans:  none Current ergotamine:  none Current anti-emetic:  none Current muscle relaxants:  cyclobenzaprine  Current Antihypertensive medications:  metoprolol  succinate XL 25mg  daily, Imdur  Current Antidepressant medications:  duloxetine  30mg  daily (recently started) Current Anticonvulsant medications:  gabapentin  600mg  BID  Current anti-CGRP:  none Current Vitamins/Herbal/Supplements:  magnesium  oxide 400mg , KCl Current Antihistamines/Decongestants:  none Other therapy:   none Other medications:  levothyroxine , NTG PRN, torsemide , Mounjaro , Vydaqel, warfarin   PAST MEDICAL HISTORY: Past Medical History:  Diagnosis Date   Back pain    CHF (congestive heart failure) (HCC)    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 01/2015 Cardiolite : + inf wall ischemia, EF 56%;  b. 01/2015 Cath: LM nl, LAD 90m, LCX min irregs, RCA dominant, 50-73m.   Sleep apnea    Swallowing difficulty     PAST SURGICAL HISTORY: Past Surgical History:  Procedure Laterality Date   ABDOMINAL HYSTERECTOMY     BUBBLE STUDY  07/01/2020   Procedure: BUBBLE STUDY;  Surgeon: Loni Soyla LABOR, MD;  Location: [facility] ENDOSCOPY;  Service: Cardiovascular;;   BUBBLE STUDY  08/05/2020   Procedure: BUBBLE STUDY;  Surgeon: Barbaraann Darryle Ned, MD;  Location: [facility] ENDOSCOPY;  Service: Cardiovascular;;  done with definity     CARDIAC CATHETERIZATION     ESOPHAGOGASTRODUODENOSCOPY     a. 01/2015 EGD: patent esophagus.   ESOPHAGOGASTRODUODENOSCOPY N/A 01/20/2015   Procedure: ESOPHAGOGASTRODUODENOSCOPY (EGD);  Surgeon: Belvie Just, MD;  Location: [facility] ENDOSCOPY;  Service: Endoscopy;  Laterality: N/A;   HAND SURGERY     JOINT REPLACEMENT     reports history bilateral TKA and right TSA   LEFT HEART CATHETERIZATION WITH CORONARY ANGIOGRAM N/A 01/19/2015   RIGHT HEART CATH N/A 12/27/2021   Procedure: RIGHT  HEART CATH;  Surgeon: Court Dorn PARAS, MD;  Location: [facility] INVASIVE CV LAB;  Service: Cardiovascular;  Laterality: N/A;   TEE WITHOUT CARDIOVERSION  05/04/2020   TEE WITHOUT CARDIOVERSION N/A 05/05/2020   Procedure: TRANSESOPHAGEAL ECHOCARDIOGRAM (TEE);  Surgeon: Mona Vinie BROCKS, MD;  Location: [facility] ENDOSCOPY;  Service:  Cardiovascular;  Laterality: N/A;   TEE WITHOUT CARDIOVERSION N/A 07/01/2020   Procedure: TRANSESOPHAGEAL ECHOCARDIOGRAM (TEE);  Surgeon: Loni Soyla LABOR, MD;  Location: [facility] ENDOSCOPY;  Service: Cardiovascular;  Laterality: N/A;   TEE WITHOUT CARDIOVERSION N/A 08/05/2020   Procedure: TRANSESOPHAGEAL ECHOCARDIOGRAM (TEE);  Surgeon: Barbaraann Darryle Ned, MD;  Location: [facility] ENDOSCOPY;  Service: Cardiovascular;  Laterality: N/A;   THYROIDECTOMY      MEDICATIONS: Current Outpatient Medications on File Prior to Visit  Medication Sig Dispense Refill   Accu-Chek Softclix Lancets lancets USE TO TEST BLOOD SUGAR UP TO TWO TIMES DAILY AS NEEDED 100 each 5   acetaminophen  (TYLENOL ) 500 MG tablet Take 1,000 mg by mouth every 6 (six) hours as needed for mild pain.     Alcohol  Swabs (B-D SINGLE USE SWABS REGULAR) PADS Use to test blood sugar up to 3 times daily 100 each 3   allopurinol  (ZYLOPRIM ) 100 MG tablet TAKE ONE TABLET BY MOUTH EACH MORNING 90 tablet 3   atorvastatin  (LIPITOR) 20 MG tablet TAKE ONE TABLET BY MOUTH EVERYDAY AT BEDTIME 90 tablet 3   Blood Glucose Monitoring Suppl (ACCU-CHEK GUIDE) w/Device KIT Use to check blood sugars 1 kit 0   cetirizine (ZYRTEC) 10 MG chewable tablet Chew 10 mg by mouth daily.     Continuous Glucose Receiver (FREESTYLE LIBRE 2 READER) DEVI USE TO TEST BLOOD SUGAR AS DIRECTED 1 each 0   Continuous Glucose Sensor (FREESTYLE LIBRE 2 SENSOR) MISC USE TO MONITOR BLOOD SUGAR. CHANGE SENSOR EVERY 14 DAYS 6 each 3   DULoxetine  (CYMBALTA ) 30 MG capsule Take 30 mg by mouth every other day.     Eplontersen  Sodium (WAINUA ) 45 MG/0.8ML SOAJ Inject 45 mg into the skin every 30 (thirty) days. 2.4 mL 1   FARXIGA  10 MG TABS tablet Take 1 tablet (10 mg total) by mouth daily. 90 tablet 3   gabapentin  (NEURONTIN ) 600 MG tablet TAKE ONE TABLET BY MOUTH AT BREAKFAST AND AT BEDTIME 60 tablet 5   glucose blood (ACCU-CHEK GUIDE) test strip Use to test blood sugar up to 2 times daily as  needed 100 each 3   insulin  lispro (HUMALOG  KWIKPEN) 100 UNIT/ML KwikPen Inject 4-8 Units into the skin daily as needed. Sliding scale: Max daily 30 units 30 mL 1   Insulin  Pen Needle 32G X 4 MM MISC 1 Device by Does not apply route in the morning, at noon, in the evening, and at bedtime. 400 each 3   isosorbide  mononitrate (IMDUR ) 30 MG 24 hr tablet Take 1 tablet (30 mg total) by mouth daily. 30 tablet 6   Lancets Misc. (ACCU-CHEK FASTCLIX LANCET) KIT Use to test blood sugar up to 2 times daily as needed 1 kit 1   levothyroxine  (SYNTHROID ) 150 MCG tablet TAKE ONE TABLET BY MOUTH BEFORE BREAKFAST 90 tablet 3   magnesium  oxide (MAG-OX) 400 MG tablet TAKE ONE TABLET BY MOUTH EACH MORNING 90 tablet 2   metoprolol  succinate (TOPROL -XL) 25 MG 24 hr tablet TAKE ONE TABLET BY MOUTH ONCE DAILY with OR immedaitely following A meal 90 tablet 3   Multiple Vitamin (MULTIVITAMIN) tablet Take 1 tablet by mouth daily. K FREE DAILY once daily (MVI with  no vitamin K)     nitroGLYCERIN  (NITROSTAT ) 0.4 MG SL tablet DISSOLVE ONE TABLET UNDER THE TONGUE AS NEEDED FOR CHST PAIN EVERY 5 MINUTES UP TO THREE TIMES. IF NO REFILL CALL 911. (Patient taking differently: Place 0.4 mg under the tongue every 5 (five) minutes as needed. DISSOLVE ONE TABLET UNDER THE TONGUE AS NEEDED FOR CHST PAIN EVERY 5 MINUTES UP TO THREE TIMES. IF NO REFILL CALL 911.) 75 tablet 2   pantoprazole  (PROTONIX ) 20 MG tablet TAKE ONE TABLET BY MOUTH DAILY 60 tablet 2   potassium chloride  SA (KLOR-CON  M) 20 MEQ tablet Take 1 tablet (20 mEq total) by mouth daily.     Propylene Glycol (SYSTANE BALANCE OP) Place 1 drop into both eyes daily as needed (for dry eyes).     sacubitril -valsartan  (ENTRESTO ) 24-26 MG Take 1 tablet by mouth 2 (two) times daily. 90 tablet 3   Tafamidis  Meglumine , Cardiac, (VYNDAQEL ) 20 MG CAPS Take 4 capsules (80 mg total) by mouth daily. 120 capsule 11   tirzepatide  (MOUNJARO ) 7.5 MG/0.5ML Pen Inject 7.5 mg into the skin once a week.  6 mL 3   torsemide  (DEMADEX ) 100 MG tablet Take 100 mg by mouth daily.     trolamine salicylate (ASPERCREME) 10 % cream Apply 1 application. topically as needed for muscle pain.     warfarin (COUMADIN ) 2.5 MG tablet TAKE 1/2 TO 1 (ONE-HALF TO ONE) TABLET BY MOUTH ONCE DAILY AS  DIRECTED 100 tablet 0   Fluocinolone  Acetonide 0.01 % OIL instill one drop in ears every 2-3 DAYS AS NEEDED FOR pruritis 20 mL 1   Furosemide  (FUROSCIX ) 80 MG/10ML CTKT Inject 80 mg into the skin daily. (Patient not taking: Reported on 06/06/2024)     No current facility-administered medications on file prior to visit.      ALLERGIES: Allergies  Allergen Reactions   Ace Inhibitors Other (See Comments)    Unknown reaction   Amlodipine Other (See Comments)    Unknown reaction   Atenolol Other (See Comments)    bradycardia   Avandia [Rosiglitazone] Other (See Comments)    Unknown reaction   Darvon [Propoxyphene] Other (See Comments)    Hallucinations    Erythromycin Itching   Hydralazine  Other (See Comments)    Burning in throat and chest   Hydrocodone Other (See Comments)    Hallucinations.   Levofloxacin Itching   Morphine  And Codeine Other (See Comments)    Dizzy and hallucianation, vomiting; Willing to try low dose   Percocet [Oxycodone-Acetaminophen ] Other (See Comments)    hallucination   Spironolactone Other (See Comments)    Unknown reaction   Tramadol Other (See Comments)    Unknown/does not recall reaction but does not want to take again    FAMILY HISTORY: Family History  Problem Relation Age of Onset   Heart disease Mother    Hypertension Mother    Cancer Father    Alcoholism Father     Objective:  Blood pressure 111/65, pulse 93, height 5' 3 (1.6 m), weight 224 lb (101.6 kg), SpO2 99%. General: No acute distress.  Patient appears well-groomed.   Head:  Normocephalic/atraumatic Eyes:  fundi examined but not visualized Neck: supple, left paraspinal tenderness, full range of  motion Heart: regular rate and rhythm Neurological Exam: Mental status: alert and oriented to person, place, and time, speech fluent and not dysarthric, language intact. Cranial nerves: CN I: not tested CN II: pupils equal, round and reactive to light, visual fields intact CN III, IV,  VI:  full range of motion, no nystagmus, no ptosis CN V: facial sensation intact. CN VII: upper and lower face symmetric CN VIII: hearing intact CN IX, X: gag intact, uvula midline CN XI: sternocleidomastoid and trapezius muscles intact CN XII: tongue midline Bulk & Tone: normal, no fasciculations. Motor:  muscle strength 5-/5 grip bilaterally, otherwise 5/5 throughout Sensation:  Pinprick sensation reduced in first 3 digits of both hands, vibratory sensation intact. Deep Tendon Reflexes:  2+ throughout,  toes downgoing.   Finger to nose testing:  Without dysmetria.   Gait:  Cautious gait.  Uses assistance.  Romberg with mild sway.    Thank you for allowing me to take part in the care of this patient.  Juliene Dunnings, DO  CC: Betty Swaziland, MD

## 2024-06-06 NOTE — Progress Notes (Signed)
 Description   (USE GREEN TABLET ONLY)  INR-2.6; Continue taking warfarin 1 tablet (2.5mg ) daily except for 1/2 tablet (1.25mg ) on Thursdays. Recheck INR in 4 weeks. Anticoagulation Clinic [phone]

## 2024-06-06 NOTE — Patient Instructions (Addendum)
 Plan for MRI of cervical spine without contrast. We have sent a referral to [facility] for your MRI and they will call you directly to schedule your appointment. They are located at [address] [facility]. If you need to contact them directly please call [phone].  Continue duloxetine  for now.  If no improvement in 6 weeks, contact me Further recommendations pending MRI results.  If unremarkable, plan would be the nerve conduction study. Follow up 6 months.

## 2024-06-07 ENCOUNTER — Other Ambulatory Visit: Payer: Self-pay

## 2024-06-07 NOTE — Progress Notes (Signed)
 ADVANCED HF CLINIC NOTE  PCP: [place], [name], MD Primary Cardiologist: [name], MD HF Cardiologist: Dr. Cherrie  HPI: Regina [name] is a 83 y.o. woman with PMHx as below. Referred by Dr. Decent for further evaluation of he hATTR cardiac amyloidosis  On tafamadis (started 2021) and eplonteresen (started 2/24). Seen in f/u by Dr. Decent on 01/06/2023 with volume overload He increased her torsemide  to 40/20  Echo 4/24 LVEF 40% with normal pressures.   She was seen in the ER 02/06/2023 for shortness of breath.  She was given IV Lasix  with good result and discharged without admission.  She was seen by Mercy Hails, PA on 02/09/23. Weight up 10 pounds despite mistakenly taking 120 mg torsemide  daily. Labs with stable Scr 2.01.  Initial visit with Dr. Cherrie 5/24, NYHA III and volume up. Entresto  started and given Furoscix  for PRN home use with paramedicine support.  Seen in ED 04/11/23 with CP. HsTroponin 54>>57. No ECG changes, symptoms improved with nitroglycerin , she was started on Imdur  30 mg daily and felt stable for discharge home with close HF follow up.   Today she returns for AHF follow up with her son. She was seen in clinic last week and was volume overloaded, given Furoscix  for 3 days, reports mild increase in UOP. Overall feeling ok. Denies palpitations, CP, dizziness, edema, or PND/Orthopnea. Sleeps on 2 pillows. SOB with steps. Appetite ok, she watches what she eats and avoids salt. If they eat out they ask for her food to be made with no salt, does not eat out often. Drinks ~36-48 oz water / day.  No fever or chills. Weight at home 219-222 pounds. Taking all medications. Denies ETOH, tobacco or drug use.   Cardiac Studies - Echo 8/25: EF 40-45%, GHK, mod concentric LVH, RV mod reduced, LA/RA mildly dilated, mild MR - Echo (4/24): EF 40% with normal pressures - Echo (7/22): EF 45-50% - cMRI (1/22): EF 30% diffuse LGE ECV 67% RVEF 30% - EF (10/21): EF 15% in Afib  with RVR - PYP + 7/21 (Grade 3)  Problem List 1.  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 Chronic AF - LAA sludge vs thrombus 05/05/2020  - persistent sludge 07/01/2020 - LAA thrombus persistent 08/05/2020 - on warfarin 3. DM 4. HTN 5. Non-obstructive CAD - LHC 01/19/2015 [facility] [place] 50% mid RCA and 50% LAD - MPI 08/14/2020 -> normal 6. OSA 7. CKD 4 (b/l Scr ~2.0) 8. CVA 11/2020 @ [facility]  9. Polymyalgia Rheumatica  10. Morbid obesity  Past Medical History:  Diagnosis Date   Back pain    CHF (congestive heart failure) (HCC)    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 01/2015 [facility] : + inf wall ischemia, EF 56%;  b. 01/2015 Cath: LM nl, LAD 30m, LCX min irregs, RCA dominant, 50-25m.   Sleep apnea    Swallowing difficulty  Current Outpatient Medications  Medication Sig Dispense Refill   Accu-Chek Softclix Lancets lancets USE TO TEST BLOOD SUGAR UP TO TWO TIMES DAILY AS NEEDED 100 each 5   acetaminophen  (TYLENOL ) 500 MG tablet Take 1,000 mg by mouth every 6 (six) hours as needed for mild pain.     Alcohol  Swabs (B-D SINGLE USE SWABS REGULAR) PADS Use to test blood sugar up to 3 times daily 100 each 3   allopurinol  (ZYLOPRIM ) 100 MG tablet TAKE ONE TABLET BY MOUTH EACH MORNING 90 tablet 3   atorvastatin  (LIPITOR) 20 MG tablet TAKE ONE TABLET BY MOUTH EVERYDAY AT BEDTIME 90 tablet 3   Blood Glucose Monitoring Suppl (ACCU-CHEK GUIDE) w/Device KIT Use to check blood sugars 1 kit 0    cetirizine (ZYRTEC) 10 MG chewable tablet Chew 10 mg by mouth daily.     Continuous Glucose Receiver (FREESTYLE LIBRE 2 READER) DEVI USE TO TEST BLOOD SUGAR AS DIRECTED 1 each 0   Continuous Glucose Sensor (FREESTYLE LIBRE 2 SENSOR) MISC USE TO MONITOR BLOOD SUGAR. CHANGE SENSOR EVERY 14 DAYS 6 each 3   DULoxetine  (CYMBALTA ) 30 MG capsule Take 30 mg by mouth every other day.     Eplontersen  Sodium (WAINUA ) 45 MG/0.8ML SOAJ Inject 45 mg into the skin every 30 (thirty) days. 2.4 mL 1   FARXIGA  10 MG TABS tablet Take 1 tablet (10 mg total) by mouth daily. 90 tablet 3   Fluocinolone  Acetonide 0.01 % OIL instill one drop in ears every 2-3 DAYS AS NEEDED FOR pruritis 20 mL 1   gabapentin  (NEURONTIN ) 600 MG tablet TAKE ONE TABLET BY MOUTH AT BREAKFAST AND AT BEDTIME 60 tablet 5   glucose blood (ACCU-CHEK GUIDE) test strip Use to test blood sugar up to 2 times daily as needed 100 each 3   insulin  lispro (HUMALOG  KWIKPEN) 100 UNIT/ML KwikPen Inject 4-8 Units into the skin daily as needed. Sliding scale: Max daily 30 units 30 mL 1   Insulin  Pen Needle 32G X 4 MM MISC 1 Device by Does not apply route in the morning, at noon, in the evening, and at bedtime. 400 each 3   isosorbide  mononitrate (IMDUR ) 30 MG 24 hr tablet Take 1 tablet (30 mg total) by mouth daily. 30 tablet 6   Lancets Misc. (ACCU-CHEK FASTCLIX LANCET) KIT Use to test blood sugar up to 2 times daily as needed 1 kit 1   levothyroxine  (SYNTHROID ) 150 MCG tablet TAKE ONE TABLET BY MOUTH BEFORE BREAKFAST 90 tablet 3   magnesium  oxide (MAG-OX) 400 MG tablet TAKE ONE TABLET BY MOUTH EACH MORNING 90 tablet 2   metoprolol  succinate (TOPROL -XL) 25 MG 24 hr tablet TAKE ONE TABLET BY MOUTH ONCE DAILY with OR immedaitely following A meal 90 tablet 3   Multiple Vitamin (MULTIVITAMIN) tablet Take 1 tablet by mouth daily. K FREE DAILY once daily (MVI with no vitamin K)     nitroGLYCERIN  (NITROSTAT ) 0.4 MG SL tablet DISSOLVE ONE TABLET UNDER THE TONGUE AS NEEDED  FOR CHST PAIN EVERY 5 MINUTES UP TO THREE TIMES. IF NO REFILL CALL 911. (Patient taking differently: Place 0.4 mg under the tongue every 5 (five) minutes as needed. DISSOLVE ONE TABLET UNDER THE TONGUE AS NEEDED FOR CHST PAIN EVERY 5 MINUTES UP TO THREE TIMES. IF NO REFILL CALL 911.) 75 tablet 2   pantoprazole  (PROTONIX ) 20 MG tablet TAKE ONE TABLET BY MOUTH DAILY 60 tablet 2   potassium chloride  SA (KLOR-CON  M) 20 MEQ tablet Take 1 tablet (  20 mEq total) by mouth daily.     Propylene Glycol (SYSTANE BALANCE OP) Place 1 drop into both eyes daily as needed (for dry eyes).     sacubitril -valsartan  (ENTRESTO ) 24-26 MG Take 1 tablet by mouth 2 (two) times daily. 90 tablet 3   Tafamidis  Meglumine , Cardiac, (VYNDAQEL ) 20 MG CAPS Take 4 capsules (80 mg total) by mouth daily. 120 capsule 11   tirzepatide  (MOUNJARO ) 7.5 MG/0.5ML Pen Inject 7.5 mg into the skin once a week. 6 mL 3   torsemide  (DEMADEX ) 100 MG tablet Take 100 mg by mouth daily.     trolamine salicylate (ASPERCREME) 10 % cream Apply 1 application. topically as needed for muscle pain.     warfarin (COUMADIN ) 2.5 MG tablet TAKE 1/2 TO 1 (ONE-HALF TO ONE) TABLET BY MOUTH ONCE DAILY AS  DIRECTED 100 tablet 0   Furosemide  (FUROSCIX ) 80 MG/10ML CTKT Inject 80 mg into the skin daily. (Patient not taking: Reported on 06/11/2024)     No current facility-administered medications for this encounter.   Allergies  Allergen Reactions   Ace Inhibitors Other (See Comments)    Unknown reaction   Amlodipine Other (See Comments)    Unknown reaction   Atenolol Other (See Comments)    bradycardia   Avandia [Rosiglitazone] Other (See Comments)    Unknown reaction   Darvon [Propoxyphene] Other (See Comments)    Hallucinations    Erythromycin Itching   Hydralazine  Other (See Comments)    Burning in throat and chest   Hydrocodone Other (See Comments)    Hallucinations.   Levofloxacin Itching   Morphine  And Codeine Other (See Comments)    Dizzy and  hallucianation, vomiting; Willing to try low dose   Percocet [Oxycodone-Acetaminophen ] Other (See Comments)    hallucination   Spironolactone Other (See Comments)    Unknown reaction   Tramadol Other (See Comments)    Unknown/does not recall reaction but does not want to take again   Social History   Socioeconomic History   Marital status: Widowed    Spouse name: Not on file   Number of children: Not on file   Years of education: Not on file   Highest education level: Associate degree: academic program  Occupational History   Occupation: Retired  Tobacco Use   Smoking status: Never   Smokeless tobacco: Never  Vaping Use   Vaping status: Never Used  Substance and Sexual Activity   Alcohol  use: No   Drug use: Never   Sexual activity: Not Currently  Other Topics Concern   Not on file  Social History Narrative   Lives with daughter   Social Drivers of Health   Financial Resource Strain: Low Risk  (03/04/2024)   Overall Financial Resource Strain (CARDIA)    Difficulty of Paying Living Expenses: Not hard at all  Food Insecurity: No Food Insecurity (03/04/2024)   Hunger Vital Sign    Worried About Running Out of Food in the Last Year: Never true    Ran Out of Food in the Last Year: Never true  Transportation Needs: No Transportation Needs (03/04/2024)   PRAPARE - Administrator, Civil Service (Medical): No    Lack of Transportation (Non-Medical): No  Physical Activity: Inactive (03/04/2024)   Exercise Vital Sign    Days of Exercise per Week: 0 days    Minutes of Exercise per Session: 0 min  Stress: No Stress Concern Present (03/04/2024)   [employer] of Occupational Health - Occupational Stress Questionnaire  Feeling of Stress : Not at all  Social Connections: Moderately Integrated (03/04/2024)   Social Connection and Isolation Panel    Frequency of Communication with Friends and Family: More than three times a week    Frequency of Social Gatherings with  Friends and Family: More than three times a week    Attends Religious Services: More than 4 times per year    Active Member of [employer] or Organizations: Yes    Attends Banker Meetings: More than 4 times per year    Marital Status: Widowed  Intimate Partner Violence: Not At Risk (03/04/2024)   Humiliation, Afraid, Rape, and Kick questionnaire    Fear of Current or Ex-Partner: No    Emotionally Abused: No    Physically Abused: No    Sexually Abused: No   Family History  Problem Relation Age of Onset   Heart disease Mother    Hypertension Mother    Cancer Father    Alcoholism Father    BP 126/72   Pulse 87   Ht 5' 2 (1.575 m)   Wt 101.7 kg (224 lb 3.2 oz)   SpO2 97%   BMI 41.01 kg/m   Wt Readings from Last 3 Encounters:  06/11/24 101.7 kg (224 lb 3.2 oz)  06/06/24 101.6 kg (224 lb)  06/04/24 103.5 kg (228 lb 3.2 oz)    PHYSICAL EXAM: General:  elderly appearing.  No respiratory difficulty. Walked into clinic.  Neck: JVD ~7 cm.  Cor: Regular rate & rhythm. No murmurs. Lungs: clear Extremities: trace ankle edema  Neuro: alert & oriented x 3. Affect pleasant.   ReDs reading: 37%, abnormal   EKG: atrial flutter 86 bpm (Personally reviewed)    ASSESSMENT & PLAN:  1. 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 Suspect deconditioning playing a role in some of her dyspnea. Mildly volume overloaded today. ReDs 37%. Weight down 4lbs with Furoscix .  - Increase torsemide  to 60 mg BID + 20 KCL daily. - Continue Tafamadis and eplonteresen - Continue Farxiga  10 mg daily. Denies GU symptoms.  - Continue Toprol  XL 25 mg daily. - Continue  Entresto  24/26 mg bid (previously reduced d/t hypotension)  - BMET, BNP, Mg today  2. CKD 4 - Baseline Scr is 2 - Continue Farxiga  - Follows with [facility] - Labs today  3. Coronary artery disease  - Non-obstructive CAD in the past.   - No ASA as she is on warfarin  - No s/s angina - Continue statin, LDL 48 9/24. Update at f/u.  - Followed by Dr. Barbaraann   4. Permanent AF - Continue warfarin. Followed by Coumadin  Clinic - Previously on Eliquis  and Xarelto but switch to warfarin due to persistence of LAA clot - Zio 7 day (5/24) showed 100% AFL burden, HR 40-153 - Rate controlled  5. Obesity  - Body mass index is 41.01 kg/m. - on tirzepatide    6. 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  - No change  Follow up in 3-4 weeks with APP.   Beckey LITTIE Coe, NP  11:38 AM

## 2024-06-10 ENCOUNTER — Other Ambulatory Visit: Payer: Self-pay

## 2024-06-10 ENCOUNTER — Telehealth (HOSPITAL_COMMUNITY): Payer: Self-pay

## 2024-06-10 NOTE — Progress Notes (Signed)
 Specialty Pharmacy Refill Coordination Note  Regina [name] is a 83 y.o. female contacted today regarding refills of specialty medication(s) Tafamidis  Meglumine  (Cardiac) (Vyndaqel )   Patient requested Delivery   Delivery date: 06/12/24   Verified address: [address] [place] [address]   Medication will be filled on 06/11/24.

## 2024-06-10 NOTE — Telephone Encounter (Signed)
 Called to confirm/remind patient of their appointment at the [facility] on 06/11/24 11:00.   Appointment:   [x] Confirmed  [] Left mess   [] No answer/No voice mail  [] VM Full/unable to leave message  [] Phone not in service  Patient reminded to bring all medications and/or complete list.  Confirmed patient has transportation. Gave directions, instructed to utilize valet parking.

## 2024-06-11 ENCOUNTER — Ambulatory Visit (HOSPITAL_COMMUNITY)
Admission: RE | Admit: 2024-06-11 | Discharge: 2024-06-11 | Disposition: A | Discharge status: Home / Self Care | Source: Ambulatory Visit | Attending: Internal Medicine | Admitting: Internal Medicine

## 2024-06-11 ENCOUNTER — Ambulatory Visit (HOSPITAL_COMMUNITY): Payer: Self-pay | Admitting: Internal Medicine

## 2024-06-11 ENCOUNTER — Encounter (HOSPITAL_COMMUNITY): Payer: Self-pay

## 2024-06-11 ENCOUNTER — Ambulatory Visit
Admission: RE | Admit: 2024-06-11 | Discharge: 2024-06-11 | Disposition: A | Discharge status: Home / Self Care | Source: Ambulatory Visit | Attending: Neurology | Admitting: Neurology

## 2024-06-11 VITALS — BP 126/72 | HR 87 | Ht 62.0 in | Wt 224.2 lb

## 2024-06-11 DIAGNOSIS — I130 Hypertensive heart and chronic kidney disease with heart failure and stage 1 through stage 4 chronic kidney disease, or unspecified chronic kidney disease: Secondary | ICD-10-CM | POA: Diagnosis not present

## 2024-06-11 DIAGNOSIS — I5022 Chronic systolic (congestive) heart failure: Secondary | ICD-10-CM | POA: Insufficient documentation

## 2024-06-11 DIAGNOSIS — E1122 Type 2 diabetes mellitus with diabetic chronic kidney disease: Secondary | ICD-10-CM | POA: Insufficient documentation

## 2024-06-11 DIAGNOSIS — M50223 Other cervical disc displacement at C6-C7 level: Secondary | ICD-10-CM | POA: Diagnosis not present

## 2024-06-11 DIAGNOSIS — Z79899 Other long term (current) drug therapy: Secondary | ICD-10-CM | POA: Diagnosis not present

## 2024-06-11 DIAGNOSIS — E669 Obesity, unspecified: Secondary | ICD-10-CM | POA: Insufficient documentation

## 2024-06-11 DIAGNOSIS — Z6841 Body Mass Index (BMI) 40.0 and over, adult: Secondary | ICD-10-CM | POA: Insufficient documentation

## 2024-06-11 DIAGNOSIS — Z7901 Long term (current) use of anticoagulants: Secondary | ICD-10-CM | POA: Diagnosis not present

## 2024-06-11 DIAGNOSIS — R29898 Other symptoms and signs involving the musculoskeletal system: Secondary | ICD-10-CM

## 2024-06-11 DIAGNOSIS — I2510 Atherosclerotic heart disease of native coronary artery without angina pectoris: Secondary | ICD-10-CM | POA: Insufficient documentation

## 2024-06-11 DIAGNOSIS — G8929 Other chronic pain: Secondary | ICD-10-CM

## 2024-06-11 DIAGNOSIS — Z7984 Long term (current) use of oral hypoglycemic drugs: Secondary | ICD-10-CM | POA: Insufficient documentation

## 2024-06-11 DIAGNOSIS — R200 Anesthesia of skin: Secondary | ICD-10-CM

## 2024-06-11 DIAGNOSIS — I43 Cardiomyopathy in diseases classified elsewhere: Secondary | ICD-10-CM | POA: Insufficient documentation

## 2024-06-11 DIAGNOSIS — G629 Polyneuropathy, unspecified: Secondary | ICD-10-CM | POA: Diagnosis not present

## 2024-06-11 DIAGNOSIS — I482 Chronic atrial fibrillation, unspecified: Secondary | ICD-10-CM | POA: Insufficient documentation

## 2024-06-11 DIAGNOSIS — I4821 Permanent atrial fibrillation: Secondary | ICD-10-CM | POA: Insufficient documentation

## 2024-06-11 DIAGNOSIS — N184 Chronic kidney disease, stage 4 (severe): Secondary | ICD-10-CM | POA: Diagnosis not present

## 2024-06-11 DIAGNOSIS — E854 Organ-limited amyloidosis: Secondary | ICD-10-CM | POA: Diagnosis not present

## 2024-06-11 DIAGNOSIS — M47813 Spondylosis without myelopathy or radiculopathy, cervicothoracic region: Secondary | ICD-10-CM | POA: Diagnosis not present

## 2024-06-11 LAB — BASIC METABOLIC PANEL WITH GFR
Anion gap: 12 (ref 5–15)
BUN: 46 mg/dL — ABNORMAL HIGH (ref 8–23)
CO2: 28 mmol/L (ref 22–32)
Calcium: 9 mg/dL (ref 8.9–10.3)
Chloride: 93 mmol/L — ABNORMAL LOW (ref 98–111)
Creatinine, Ser: 2.24 mg/dL — ABNORMAL HIGH (ref 0.44–1.00)
GFR, Estimated: 22 mL/min — ABNORMAL LOW (ref >60)
Glucose, Bld: 103 mg/dL — ABNORMAL HIGH (ref 70–99)
Potassium: 4.1 mmol/L (ref 3.5–5.1)
Sodium: 133 mmol/L — ABNORMAL LOW (ref 135–145)

## 2024-06-11 LAB — MAGNESIUM: Magnesium: 2 mg/dL (ref 1.7–2.4)

## 2024-06-11 LAB — BRAIN NATRIURETIC PEPTIDE: B Natriuretic Peptide: 608.7 pg/mL — ABNORMAL HIGH (ref 0.0–100.0)

## 2024-06-11 MED ORDER — TORSEMIDE 20 MG PO TABS: 60.0000 mg | ORAL_TABLET | Freq: Two times a day (BID) | ORAL | 3 refills | Status: AC

## 2024-06-11 NOTE — Progress Notes (Signed)
 ReDS Vest / Clip - 06/11/24 1118       ReDS Vest / Clip   Station Marker B    Ruler Value 39    ReDS Value Range Moderate volume overload    ReDS Actual Value 37

## 2024-06-11 NOTE — Addendum Note (Signed)
 Encounter addended by: Micael Sueanne LABOR, CMA on: 06/11/2024 12:33 PM  Actions taken: Flowsheet accepted, Clinical Note Signed

## 2024-06-11 NOTE — Patient Instructions (Signed)
 Medication Changes:  INCREASE TORSEMIDE  TO 60MG  TWICE DAILY   Lab Work:  Labs done today, your results will be available in MyChart, we will contact you for abnormal readings.  Special Instructions // Education:  WEAR COMPRESSION STOCKINGS   Follow-Up in: 3-4 WEEKS AS SCHEDULED   At the [facility], you and your health needs are our priority. We have a designated team specialized in the treatment of Heart Failure. This Care Team includes your primary Heart Failure Specialized Cardiologist (physician), Advanced Practice Providers (APPs- Physician Assistants and Nurse Practitioners), and Pharmacist who all work together to provide you with the care you need, when you need it.   You may see any of the following providers on your designated Care Team at your next follow up:  Dr. Toribio Fuel Dr. Ezra Shuck Dr. Ria Commander Dr. Odis Brownie Greig Mosses, NP Caffie Shed, [place] [facility], [place] Beckey Coe, NP [place] Lee, NP Tinnie Redman, PharmD   Please be sure to bring in all your medications bottles to every appointment.   Need to Contact Us :  If you have any questions or concerns before your next appointment please send us  a message through [place] or call our office at [phone].    TO LEAVE A MESSAGE FOR THE NURSE SELECT OPTION 2, PLEASE LEAVE A MESSAGE INCLUDING: YOUR NAME DATE OF BIRTH CALL BACK NUMBER REASON FOR CALL**this is important as we prioritize the call backs  YOU WILL RECEIVE A CALL BACK THE SAME DAY AS LONG AS YOU CALL BEFORE 4:00 PM

## 2024-06-14 ENCOUNTER — Other Ambulatory Visit (HOSPITAL_COMMUNITY): Payer: Self-pay | Admitting: Internal Medicine

## 2024-06-14 DIAGNOSIS — N184 Chronic kidney disease, stage 4 (severe): Secondary | ICD-10-CM | POA: Diagnosis not present

## 2024-06-18 ENCOUNTER — Ambulatory Visit: Payer: Self-pay | Admitting: Neurology

## 2024-06-18 ENCOUNTER — Encounter: Payer: Self-pay | Admitting: Internal Medicine

## 2024-06-18 ENCOUNTER — Ambulatory Visit: Admitting: Internal Medicine

## 2024-06-18 VITALS — BP 128/80 | HR 83 | Ht 62.0 in | Wt 224.0 lb

## 2024-06-18 DIAGNOSIS — E1140 Type 2 diabetes mellitus with diabetic neuropathy, unspecified: Secondary | ICD-10-CM

## 2024-06-18 DIAGNOSIS — I43 Cardiomyopathy in diseases classified elsewhere: Secondary | ICD-10-CM | POA: Diagnosis not present

## 2024-06-18 DIAGNOSIS — E11319 Type 2 diabetes mellitus with unspecified diabetic retinopathy without macular edema: Secondary | ICD-10-CM | POA: Diagnosis not present

## 2024-06-18 DIAGNOSIS — E1159 Type 2 diabetes mellitus with other circulatory complications: Secondary | ICD-10-CM

## 2024-06-18 DIAGNOSIS — N184 Chronic kidney disease, stage 4 (severe): Secondary | ICD-10-CM | POA: Diagnosis not present

## 2024-06-18 DIAGNOSIS — Z794 Long term (current) use of insulin: Secondary | ICD-10-CM | POA: Diagnosis not present

## 2024-06-18 DIAGNOSIS — E1122 Type 2 diabetes mellitus with diabetic chronic kidney disease: Secondary | ICD-10-CM

## 2024-06-18 DIAGNOSIS — N1831 Chronic kidney disease, stage 3a: Secondary | ICD-10-CM | POA: Diagnosis not present

## 2024-06-18 DIAGNOSIS — E785 Hyperlipidemia, unspecified: Secondary | ICD-10-CM | POA: Diagnosis not present

## 2024-06-18 DIAGNOSIS — I2510 Atherosclerotic heart disease of native coronary artery without angina pectoris: Secondary | ICD-10-CM | POA: Diagnosis not present

## 2024-06-18 DIAGNOSIS — I509 Heart failure, unspecified: Secondary | ICD-10-CM | POA: Diagnosis not present

## 2024-06-18 DIAGNOSIS — E854 Organ-limited amyloidosis: Secondary | ICD-10-CM | POA: Diagnosis not present

## 2024-06-18 LAB — POCT GLYCOSYLATED HEMOGLOBIN (HGB A1C): Hemoglobin A1C: 6.2 % — AB (ref 4.0–5.6)

## 2024-06-18 MED ORDER — TIRZEPATIDE 10 MG/0.5ML ~~LOC~~ SOAJ
10.0000 mg | SUBCUTANEOUS | 3 refills | Status: AC
Start: 2024-06-18 — End: ?

## 2024-06-18 NOTE — Progress Notes (Signed)
 Name: Regina [name]  Age/ Sex: 83 y.o., female   MRN/ DOB: [id number], [birth year]     PCP: [place], [name], MD   Reason for Endocrinology Evaluation: Type 2 Diabetes Mellitus  Initial Endocrine Consultative Visit: 09/09/2020    PATIENT IDENTIFIER: [name] is a 83 y.o. female with a past medical history of T2DM, HTN, CAD, OSA on CPAP  and A.Fib. The patient has followed with Endocrinology clinic since 09/09/2020 for consultative assistance with management of her diabetes.    DIABETIC HISTORY:  Regina [name] was diagnosed with DM yrs ago. Her hemoglobin A1c has ranged from 6.9% in 2021, peaking at 7.8% in 2020   On her initial visit to our clinic she had an A1c of 7.4% She was on basal insulin  and Ozemopic, she was already out of Ozempic  and we held off until more data was available about her retinopathy. We started humalog  per correction scale   She was started on Farxiga  by cardiology 04/2021   Nephrology [facility] - Dr. macel   Started Mounjaro  01/2023 Off basal insulin  05/2023  She was off prandial insulin  by December, 2024 with an A1c of 6.3%  SUBJECTIVE:   During the last visit (10/06/2023): A1c 6.3%    Today (06/18/2024): Regina [name] is here for a follow up on diabetes management.She is accompanied by her spiritual daughter Velia Holm. She checks her blood sugars multiple times a day through CGM .    She remains off prednisone  for Polymyagia rheumatica Patient follows with pulmonary for OSA Patient follows with cardiology for CHF and cardiac amyloidosis  Patient continues with weight loss No nausea , vomiting  Denies constipation or diarrhea    HOME DIABETES REGIMEN:  Mounjaro  7.5 mg weekly  CF: Humalog : ( BG-130/40) Farxiga  10 mg daily-through cardiology    Statin: yes ACE-I/ARB: no    CONTINUOUS GLUCOSE MONITORING RECORD INTERPRETATION    Dates of Recording: 8/20 - 06/18/2024  Sensor description:dexcom  Results  statistics:   CGM use % of time 62  Average and SD 116/21.9  Time in range 98 %  % Time Above 180 0  % Time above 250 0  % Time Below target 2     Glycemic patterns summary: BGs remain optimal throughout the day and night Hyperglycemic episodes  postprandial   Hypoglycemic episodes occurred during the day  Overnight periods: Optimal     DIABETIC COMPLICATIONS: Microvascular complications:  CKD III, Retinopathy ( hx of laser )  Denies: neuropathy  Last eye exam: Completed 06/03/2024   Macrovascular complications:  Non-obstructive CAD  Denies: PVD, CVA     HISTORY:  Past Medical History:  Past Medical History:  Diagnosis Date   Back pain    CHF (congestive heart failure) (HCC)    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 01/2015 Cardiolite : + inf wall ischemia, EF 56%;  b. 01/2015 Cath: LM nl, LAD 81m, LCX min irregs, RCA dominant, 50-47m.   Sleep apnea    Swallowing difficulty    Past Surgical History:  Past Surgical History:  Procedure Laterality Date   ABDOMINAL HYSTERECTOMY     BUBBLE STUDY  07/01/2020  Procedure: BUBBLE STUDY;  Surgeon: Loni Soyla LABOR, MD;  Location: [facility] ENDOSCOPY;  Service: Cardiovascular;;   BUBBLE STUDY  08/05/2020   Procedure: BUBBLE STUDY;  Surgeon: Barbaraann Darryle Ned, MD;  Location: [facility] ENDOSCOPY;  Service: Cardiovascular;;  done with definity     CARDIAC CATHETERIZATION     ESOPHAGOGASTRODUODENOSCOPY     a. 01/2015 EGD: patent esophagus.   ESOPHAGOGASTRODUODENOSCOPY N/A 01/20/2015   Procedure: ESOPHAGOGASTRODUODENOSCOPY (EGD);  Surgeon: Belvie Just, MD;  Location: [facility] ENDOSCOPY;  Service: Endoscopy;  Laterality: N/A;   HAND SURGERY     JOINT  REPLACEMENT     reports history bilateral TKA and right TSA   LEFT HEART CATHETERIZATION WITH CORONARY ANGIOGRAM N/A 01/19/2015   RIGHT HEART CATH N/A 12/27/2021   Procedure: RIGHT HEART CATH;  Surgeon: Court Dorn PARAS, MD;  Location: [facility] INVASIVE CV LAB;  Service: Cardiovascular;  Laterality: N/A;   TEE WITHOUT CARDIOVERSION  05/04/2020   TEE WITHOUT CARDIOVERSION N/A 05/05/2020   Procedure: TRANSESOPHAGEAL ECHOCARDIOGRAM (TEE);  Surgeon: Mona Vinie BROCKS, MD;  Location: [facility] ENDOSCOPY;  Service: Cardiovascular;  Laterality: N/A;   TEE WITHOUT CARDIOVERSION N/A 07/01/2020   Procedure: TRANSESOPHAGEAL ECHOCARDIOGRAM (TEE);  Surgeon: Loni Soyla LABOR, MD;  Location: [facility] ENDOSCOPY;  Service: Cardiovascular;  Laterality: N/A;   TEE WITHOUT CARDIOVERSION N/A 08/05/2020   Procedure: TRANSESOPHAGEAL ECHOCARDIOGRAM (TEE);  Surgeon: Barbaraann Darryle Ned, MD;  Location: [facility] ENDOSCOPY;  Service: Cardiovascular;  Laterality: N/A;   THYROIDECTOMY     Social History:  reports that she has never smoked. She has never used smokeless tobacco. She reports that she does not drink alcohol  and does not use drugs. Family History:  Family History  Problem Relation Age of Onset   Heart disease Mother    Hypertension Mother    Cancer Father    Alcoholism Father      HOME MEDICATIONS: Allergies as of 06/18/2024       Reactions   Ace Inhibitors Other (See Comments)   Unknown reaction   Amlodipine Other (See Comments)   Unknown reaction   Atenolol Other (See Comments)   bradycardia   Avandia [rosiglitazone] Other (See Comments)   Unknown reaction   Darvon [propoxyphene] Other (See Comments)   Hallucinations    Erythromycin Itching   Hydralazine  Other (See Comments)   Burning in throat and chest   Hydrocodone Other (See Comments)   Hallucinations.   Levofloxacin Itching   Morphine  And Codeine Other (See Comments)   Dizzy and hallucianation, vomiting; Willing to try low dose   Percocet  [oxycodone-acetaminophen ] Other (See Comments)   hallucination   Spironolactone Other (See Comments)   Unknown reaction   Tramadol Other (See Comments)   Unknown/does not recall reaction but does not want to take again        Medication List        Accurate as of June 18, 2024 10:02 AM. If you have any questions, ask your nurse or doctor.          Accu-Chek [employer] Kit Use to test blood sugar up to 2 times daily as needed   Accu-Chek Guide test strip Generic drug: glucose blood Use to test blood sugar up to 2 times daily as needed   Accu-Chek Guide w/Device Kit Use to check blood sugars   Accu-Chek Soft[employer] Lancets lancets USE TO TEST BLOOD SUGAR UP TO TWO TIMES DAILY AS NEEDED   acetaminophen  500 MG tablet Commonly known as: TYLENOL  Take 1,000 mg by mouth every 6 (six) hours as  needed for mild pain.   allopurinol  100 MG tablet Commonly known as: ZYLOPRIM  TAKE ONE TABLET BY MOUTH EACH MORNING   atorvastatin  20 MG tablet Commonly known as: LIPITOR TAKE ONE TABLET BY MOUTH AT BEDTIME   B-D SINGLE USE SWABS REGULAR Pads Use to test blood sugar up to 3 times daily   cetirizine 10 MG chewable tablet Commonly known as: ZYRTEC Chew 10 mg by mouth daily.   DULoxetine  30 MG capsule Commonly known as: CYMBALTA  Take 30 mg by mouth every other day.   Entresto  24-26 MG Generic drug: sacubitril -valsartan  Take 1 tablet by mouth 2 (two) times daily.   Farxiga  10 MG Tabs tablet Generic drug: dapagliflozin  propanediol Take 1 tablet (10 mg total) by mouth daily.   Fluocinolone  Acetonide 0.01 % Oil instill one drop in ears every 2-3 DAYS AS NEEDED FOR pruritis   FreeStyle Libre 2 Reader Espiridion USE TO TEST BLOOD SUGAR AS DIRECTED   FreeStyle Libre 2 Sensor Misc USE TO MONITOR BLOOD SUGAR. CHANGE SENSOR EVERY 14 DAYS   Furoscix  80 MG/10ML Ctkt Generic drug: Furosemide  Inject 80 mg into the skin daily.   gabapentin  600 MG tablet Commonly known as:  NEURONTIN  TAKE ONE TABLET BY MOUTH AT BREAKFAST AND AT BEDTIME   insulin  lispro 100 UNIT/ML KwikPen Commonly known as: HumaLOG  KwikPen Inject 4-8 Units into the skin daily as needed. Sliding scale: Max daily 30 units   Insulin  Pen Needle 32G X 4 MM Misc 1 Device by Does not apply route in the morning, at noon, in the evening, and at bedtime.   isosorbide  mononitrate 30 MG 24 hr tablet Commonly known as: IMDUR  Take 1 tablet (30 mg total) by mouth daily.   levothyroxine  150 MCG tablet Commonly known as: SYNTHROID  TAKE ONE TABLET BY MOUTH BEFORE BREAKFAST   magnesium  oxide 400 MG tablet Commonly known as: MAG-OX TAKE ONE TABLET BY MOUTH EACH MORNING   metoprolol  succinate 25 MG 24 hr tablet Commonly known as: TOPROL -XL TAKE ONE TABLET BY MOUTH ONCE DAILY with OR immedaitely following A meal   multivitamin tablet Take 1 tablet by mouth daily. K FREE DAILY once daily (MVI with no vitamin K)   nitroGLYCERIN  0.4 MG SL tablet Commonly known as: NITROSTAT  DISSOLVE ONE TABLET UNDER THE TONGUE AS NEEDED FOR CHST PAIN EVERY 5 MINUTES UP TO THREE TIMES. IF NO REFILL CALL 911. What changed:  how much to take how to take this when to take this reasons to take this   pantoprazole  20 MG tablet Commonly known as: PROTONIX  TAKE ONE TABLET BY MOUTH DAILY   potassium chloride  SA 20 MEQ tablet Commonly known as: KLOR-CON  M Take 1 tablet (20 mEq total) by mouth daily.   SYSTANE BALANCE OP Place 1 drop into both eyes daily as needed (for dry eyes).   tirzepatide  7.5 MG/0.5ML Pen Commonly known as: MOUNJARO  Inject 7.5 mg into the skin once a week.   torsemide  20 MG tablet Commonly known as: DEMADEX  Take 3 tablets (60 mg total) by mouth 2 (two) times daily.   trolamine salicylate 10 % cream Commonly known as: ASPERCREME Apply 1 application. topically as needed for muscle pain.   Vyndaqel  20 MG Caps Generic drug: Tafamidis  Meglumine  (Cardiac) Take 4 capsules (80 mg total) by mouth  daily.   Wainua  45 MG/0.8ML Soaj Generic drug: Eplontersen  Sodium Inject 45 mg into the skin every 30 (thirty) days.   warfarin 2.5 MG tablet Commonly known as: COUMADIN  Take as directed by the anticoagulation clinic.  If you are unsure how to take this medication, talk to your nurse or doctor. Original instructions: TAKE 1/2 TO 1 (ONE-HALF TO ONE) TABLET BY MOUTH ONCE DAILY AS  DIRECTED         OBJECTIVE:   Vital Signs: BP 128/80 (BP Location: Left Arm, Patient Position: Sitting, Cuff Size: Normal)   Pulse 83   Ht 5' 2 (1.575 m)   Wt 224 lb (101.6 kg)   SpO2 99%   BMI 40.97 kg/m   Wt Readings from Last 3 Encounters:  06/18/24 224 lb (101.6 kg)  06/11/24 224 lb 3.2 oz (101.7 kg)  06/06/24 224 lb (101.6 kg)     Exam: General: Pt appears well and is in NAD  Lungs: Clear with good BS bilat   Heart: RRR   Extremities: Trace  pretibial edema.   Neuro: MS is good with appropriate affect, pt is alert and Ox3     DM foot exam: 06/18/2024   The skin of the feet is intact without sores or ulcerations. The pedal pulses are 1+ B/L The sensation is decreased to a screening 5.07, 10 gram monofilament bilaterally   DATA REVIEWED:  Lab Results  Component Value Date   HGBA1C 6.3 (A) 10/06/2023   HGBA1C 7.0 (A) 04/27/2023   HGBA1C 7.7 (A) 01/31/2023    Latest Reference Range & Units 06/11/24 11:57 06/11/24 12:34  Sodium 135 - 145 mmol/L 133 (L)   Potassium 3.5 - 5.1 mmol/L 4.1   Chloride 98 - 111 mmol/L 93 (L)   CO2 22 - 32 mmol/L 28   Glucose 70 - 99 mg/dL 896 (H)   BUN 8 - 23 mg/dL 46 (H)   Creatinine 9.55 - 1.00 mg/dL 7.75 (H)   Calcium  8.9 - 10.3 mg/dL 9.0   Anion gap 5 - 15  12   Magnesium  1.7 - 2.4 mg/dL  2.0  GFR, Estimated >39 mL/min 22 (L)     ASSESSMENT / PLAN / RECOMMENDATIONS:   1) Type 2 Diabetes Mellitus, Optimally controlled, With retinopathic, neuropathic , CKD IV and Macrovascular   complications - Most recent A1c of 6.2%. Goal A1c < 7.5 %.     -A1c remains at goal -She is on Farxiga  through cardiology -She has been off basal insulin  -Patient interested in more weight loss, will increase Mounjaro  as below, discussed the importance of low carb diet    MEDICATIONS: Increase Mounjaro  10 mg weekly  Correction Scale  : Humalog  ( BG -130/40) TIDQAC Patient on Farxiga  10 mg through  cardiology    EDUCATION / INSTRUCTIONS: BG monitoring instructions: Patient is instructed to check her blood sugars 3 times a day, before meals . Call [facility] if: BG persistently < 70  I reviewed the Rule of 15 for the treatment of hypoglycemia in detail with the patient. Literature supplied.      2) Diabetic complications:  Eye: Does  have known diabetic retinopathy.  Neuro/ Feet: Does have known diabetic peripheral neuropathy. Renal: Patient does have known baseline CKD. She is not on an ACEI/ARB at present.      F/U in 6 months    Signed electronically by: Stefano Redgie Butts, MD  [facility] Endocrinology  [facility] [address] [place] [place], [place] [address] Phone: [phone] FAX: [phone]   CC: [place], [name], MD [address] [place] [address] Phone: [phone]  Fax: [phone]  Return to Endocrinology clinic as below: Future Appointments  Date Time Provider Department  Center  06/18/2024 10:10 AM Tricia Pledger, Donell Cardinal, MD [facility] None  07/04/2024  2:00 PM [facility] COUMADIN  CLINIC 2 [facility] H&V  07/04/2024  3:00 PM [facility] PA/NP [facility] None  07/09/2024  1:00 PM [place], [name], MD [facility] [address]  12/10/2024  1:50 PM Skeet Juliene SAUNDERS, DO [facility] None  03/12/2025  8:40 AM [facility]ANNUAL WELLNESS VISIT [facility] [address]

## 2024-06-18 NOTE — Patient Instructions (Addendum)
-   Increase Mounjaro  10 mg once weekly  - Humalog  correctional insulin : Use the scale below to help guide you BEFORE each meal   Blood sugar before meal Number of units to inject  Less than 170 0 unit  171 - 210 1 units  211 - 250 2 units  251 - 290 3 units  291 - 330 4 units  331 - 370 5 units  371 - 410 6 units  411 - 450 7  units     HOW TO TREAT LOW BLOOD SUGARS (Blood sugar LESS THAN 70 MG/DL) Please follow the RULE OF 15 for the treatment of hypoglycemia treatment (when your (blood sugars are less than 70 mg/dL)   STEP 1: Take 15 grams of carbohydrates when your blood sugar is low, which includes:  3-4 GLUCOSE TABS  OR 3-4 OZ OF JUICE OR REGULAR SODA OR ONE TUBE OF GLUCOSE GEL    STEP 2: RECHECK blood sugar in 15 MINUTES STEP 3: If your blood sugar is still low at the 15 minute recheck --> then, go back to STEP 1 and treat AGAIN with another 15 grams of carbohydrates.

## 2024-06-19 ENCOUNTER — Encounter: Payer: Self-pay | Admitting: Neurology

## 2024-06-19 ENCOUNTER — Other Ambulatory Visit (HOSPITAL_COMMUNITY): Payer: Self-pay | Admitting: Internal Medicine

## 2024-06-19 ENCOUNTER — Other Ambulatory Visit: Payer: Self-pay

## 2024-06-19 DIAGNOSIS — R29898 Other symptoms and signs involving the musculoskeletal system: Secondary | ICD-10-CM

## 2024-06-19 NOTE — Progress Notes (Signed)
 I advsied patient of MRI results, voiced understanding and will scheduled bilteral upper extremitys EMG

## 2024-06-26 ENCOUNTER — Telehealth: Payer: Self-pay | Admitting: Family Medicine

## 2024-06-26 ENCOUNTER — Other Ambulatory Visit: Payer: Self-pay | Admitting: Family Medicine

## 2024-06-26 ENCOUNTER — Other Ambulatory Visit (HOSPITAL_COMMUNITY): Payer: Self-pay | Admitting: Internal Medicine

## 2024-06-26 DIAGNOSIS — K219 Gastro-esophageal reflux disease without esophagitis: Secondary | ICD-10-CM

## 2024-06-26 NOTE — Telephone Encounter (Signed)
 Pantoprazole  already refilled today by PCP. Others pend and sent.

## 2024-06-26 NOTE — Telephone Encounter (Signed)
 Copied from CRM [id number]. Topic: Clinical - Medication Refill >> Jun 26, 2024 12:20 PM Lauren C wrote: Medication:  DULoxetine  (CYMBALTA ) 30 MG capsule pantoprazole  (PROTONIX ) 20 MG tablet Cetirozine 10mg  (pt takes for allergies from historical provider, she would like Dr. [place] to be the one to continue with her refills)  Has the patient contacted their pharmacy? No (Agent: If no, request that the patient contact the pharmacy for the refill. If patient does not wish to contact the pharmacy document the reason why and proceed with request.) (Agent: If yes, when and what did the pharmacy advise?)  This is the patient's preferred pharmacy:  [facility] [place], [place] - [address] [facility] [address] [place] [address] Phone: [phone] Fax: [phone]  Is this the correct pharmacy for this prescription? Yes If no, delete pharmacy and type the correct one.   Has the prescription been filled recently? First two, yes. Other was prescribed by external provider and she is now requesting from Dr. [place].  Is the patient out of the medication? No, few left  Has the patient been seen for an appointment in the last year OR does the patient have an upcoming appointment? Yes  Can we respond through MyChart? No, call or email  Agent: Please be advised that Rx refills may take up to 3 business days. We ask that you follow-up with your pharmacy.

## 2024-07-02 ENCOUNTER — Other Ambulatory Visit (HOSPITAL_COMMUNITY): Payer: Self-pay | Admitting: Internal Medicine

## 2024-07-02 NOTE — Progress Notes (Signed)
 ADVANCED HF CLINIC NOTE  PCP: [place], [name], MD Primary Cardiologist: [name], MD HF Cardiologist: Dr. Cherrie  HPI: Regina [name] is a 83 y.o. woman with PMHx as below. Referred by Dr. Decent for further evaluation of he hATTR cardiac amyloidosis. On tafamadis (started 2021) and eplonteresen (started 2/24). Seen in f/u by Dr. Decent on 01/06/2023 with volume overload He increased her torsemide  to 40/20  Echo 4/24 LVEF 40% with normal pressures.   She was seen in the ER 02/06/2023 for shortness of breath.  She was given IV Lasix  with good result and discharged without admission.  She was seen by Mercy Hails, PA on 02/09/23. Weight up 10 pounds despite mistakenly taking 120 mg torsemide  daily. Labs with stable Scr 2.01.  Initial visit with Dr. Cherrie 5/24, NYHA III and volume up. Entresto  started and given Furoscix  for PRN home use with paramedicine support.  Seen in ED 04/11/23 with CP. HsTroponin 54>>57. No ECG changes, symptoms improved with nitroglycerin , she was started on Imdur  30 mg daily and felt stable for discharge home with close HF follow up.   Echo 8/25 showed EF 40-455, moderate LVH, RV moderately reduced  Today she returns for HF follow up with her son. Overall feeling fine. Breathing is OK, she has fatigue if she does too much. Able to walk up steps if she takes her time. Denies palpitations, abnormal bleeding, CP, dizziness, edema, or PND/Orthopnea. Appetite ok. Weight at home 218-219 pounds. Taking all medications.    Cardiac Studies - Echo (8/25): EF 40-45%, GHK, mod concentric LVH, RV mod reduced, LA/RA mildly dilated, mild MR - Echo (4/24): EF 40% with normal pressures - Echo (7/22): EF 45-50% - cMRI (1/22): EF 30% diffuse LGE ECV 67% RVEF 30% - EF (10/21): EF 15% in Afib with RVR - PYP + 7/21 (Grade 3)  Problem List 1.  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 Chronic AF - LAA sludge vs thrombus 05/05/2020  - persistent sludge 07/01/2020 - LAA thrombus persistent 08/05/2020 - on warfarin 3. DM 4. HTN 5. Non-obstructive CAD - LHC 01/19/2015 [facility] [place] 50% mid RCA and 50% LAD - MPI 08/14/2020 -> normal 6. OSA 7. CKD 4 (b/l Scr ~2.0) 8. CVA 11/2020 @ [facility]  9. Polymyalgia Rheumatica  10. Morbid obesity  Past Medical History:  Diagnosis Date   Back pain    CHF (congestive heart failure) (HCC)    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 01/2015 Cardiolite : + inf wall ischemia, EF 56%;  b. 01/2015 Cath: LM nl, LAD 66m, LCX min irregs, RCA dominant, 50-27m.   Sleep apnea    Swallowing difficulty    Current Outpatient Medications  Medication Sig Dispense Refill   Accu-Chek Softclix Lancets lancets USE TO TEST BLOOD SUGAR UP TO TWO TIMES DAILY AS NEEDED 100 each 5   acetaminophen  (TYLENOL ) 500 MG tablet Take  1,000 mg by mouth every 6 (six) hours as needed for mild pain.     Alcohol  Swabs (B-D SINGLE USE SWABS REGULAR) PADS Use to test blood sugar up to 3 times daily 100 each 3   allopurinol  (ZYLOPRIM ) 100 MG tablet TAKE ONE TABLET BY MOUTH EACH MORNING 90 tablet 3   atorvastatin  (LIPITOR) 20 MG tablet TAKE ONE TABLET BY MOUTH AT BEDTIME 90 tablet 3   Blood Glucose Monitoring Suppl (ACCU-CHEK GUIDE) w/Device KIT Use to check blood sugars 1 kit 0   cetirizine (ZYRTEC) 10 MG chewable tablet Chew 10 mg by mouth daily.     Continuous Glucose Receiver (FREESTYLE LIBRE 2 READER) DEVI USE TO TEST BLOOD SUGAR AS DIRECTED 1 each 0   Continuous  Glucose Sensor (FREESTYLE LIBRE 2 SENSOR) MISC USE TO MONITOR BLOOD SUGAR. CHANGE SENSOR EVERY 14 DAYS 6 each 3   DULoxetine  (CYMBALTA ) 30 MG capsule Take 30 mg by mouth every other day.     Eplontersen  Sodium (WAINUA ) 45 MG/0.8ML SOAJ Inject 45 mg into the skin every 30 (thirty) days. 2.4 mL 1   FARXIGA  10 MG TABS tablet TAKE ONE TABLET BY MOUTH DAILY 90 tablet 2   Fluocinolone  Acetonide 0.01 % OIL instill one drop in ears every 2-3 DAYS AS NEEDED FOR pruritis 20 mL 1   Furosemide  (FUROSCIX ) 80 MG/10ML CTKT Inject 80 mg into the skin daily.     gabapentin  (NEURONTIN ) 600 MG tablet TAKE ONE TABLET BY MOUTH AT BREAKFAST AND AT BEDTIME 60 tablet 5   glucose blood (ACCU-CHEK GUIDE) test strip Use to test blood sugar up to 2 times daily as needed 100 each 3   insulin  lispro (HUMALOG  KWIKPEN) 100 UNIT/ML KwikPen Inject 4-8 Units into the skin daily as needed. Sliding scale: Max daily 30 units 30 mL 1   Insulin  Pen Needle 32G X 4 MM MISC 1 Device by Does not apply route in the morning, at noon, in the evening, and at bedtime. 400 each 3   isosorbide  mononitrate (IMDUR ) 30 MG 24 hr tablet Take 1 tablet (30 mg total) by mouth daily. 30 tablet 6   Lancets Misc. (ACCU-CHEK FASTCLIX LANCET) KIT Use to test blood sugar up to 2 times daily as needed 1 kit 1   levothyroxine  (SYNTHROID ) 150 MCG tablet TAKE ONE TABLET BY MOUTH BEFORE BREAKFAST 90 tablet 3   magnesium  oxide (MAG-OX) 400 MG tablet TAKE ONE TABLET BY MOUTH EACH MORNING 90 tablet 2   metoprolol  succinate (TOPROL -XL) 25 MG 24 hr tablet TAKE ONE TABLET BY MOUTH ONCE DAILY WITH OR IMMEDAITELY FOLLOWING A MEAL 90 tablet 2   Multiple Vitamin (MULTIVITAMIN) tablet Take 1 tablet by mouth daily. K FREE DAILY once daily (MVI with no vitamin K)     nitroGLYCERIN  (NITROSTAT ) 0.4 MG SL tablet DISSOLVE ONE TABLET UNDER THE TONGUE AS NEEDED FOR CHST PAIN EVERY 5 MINUTES UP TO THREE TIMES. IF NO REFILL CALL 911. 75 tablet 2   pantoprazole  (PROTONIX ) 20 MG tablet TAKE  ONE TABLET BY MOUTH DAILY 60 tablet 1   potassium chloride  SA (KLOR-CON  M) 20 MEQ tablet Take 1 tablet (20 mEq total) by mouth daily.     Propylene Glycol (SYSTANE BALANCE OP) Place 1 drop into both eyes daily as needed (for dry eyes).     sacubitril -valsartan  (ENTRESTO ) 24-26 MG Take 1 tablet by mouth 2 (two) times daily. 90 tablet 3   Tafamidis  Meglumine , Cardiac, (VYNDAQEL ) 20 MG CAPS Take 4 capsules (80 mg total) by  mouth daily. 120 capsule 11   tirzepatide  (MOUNJARO ) 10 MG/0.5ML Pen Inject 10 mg into the skin once a week. 6 mL 3   torsemide  (DEMADEX ) 20 MG tablet Take 3 tablets (60 mg total) by mouth 2 (two) times daily. 180 tablet 3   trolamine salicylate (ASPERCREME) 10 % cream Apply 1 application. topically as needed for muscle pain.     warfarin (COUMADIN ) 2.5 MG tablet TAKE 1/2 TO 1 (ONE-HALF TO ONE) TABLET BY MOUTH ONCE DAILY AS  DIRECTED 100 tablet 0   No current facility-administered medications for this encounter.   Allergies  Allergen Reactions   Ace Inhibitors Other (See Comments)    Unknown reaction   Amlodipine Other (See Comments)    Unknown reaction   Atenolol Other (See Comments)    bradycardia   Avandia [Rosiglitazone] Other (See Comments)    Unknown reaction   Darvon [Propoxyphene] Other (See Comments)    Hallucinations    Erythromycin Itching   Hydralazine  Other (See Comments)    Burning in throat and chest   Hydrocodone Other (See Comments)    Hallucinations.   Levofloxacin Itching   Morphine  And Codeine Other (See Comments)    Dizzy and hallucianation, vomiting; Willing to try low dose   Percocet [Oxycodone-Acetaminophen ] Other (See Comments)    hallucination   Spironolactone Other (See Comments)    Unknown reaction   Tramadol Other (See Comments)    Unknown/does not recall reaction but does not want to take again   Social History   Socioeconomic History   Marital status: Widowed    Spouse name: Not on file   Number of children: Not on file    Years of education: Not on file   Highest education level: Associate degree: academic program  Occupational History   Occupation: Retired  Tobacco Use   Smoking status: Never   Smokeless tobacco: Never  Vaping Use   Vaping status: Never Used  Substance and Sexual Activity   Alcohol  use: No   Drug use: Never   Sexual activity: Not Currently  Other Topics Concern   Not on file  Social History Narrative   Lives with daughter   Social Drivers of Health   Financial Resource Strain: Low Risk  (03/04/2024)   Overall Financial Resource Strain (CARDIA)    Difficulty of Paying Living Expenses: Not hard at all  Food Insecurity: No Food Insecurity (03/04/2024)   Hunger Vital Sign    Worried About Running Out of Food in the Last Year: Never true    Ran Out of Food in the Last Year: Never true  Transportation Needs: No Transportation Needs (03/04/2024)   PRAPARE - Administrator, Civil Service (Medical): No    Lack of Transportation (Non-Medical): No  Physical Activity: Inactive (03/04/2024)   Exercise Vital Sign    Days of Exercise per Week: 0 days    Minutes of Exercise per Session: 0 min  Stress: No Stress Concern Present (03/04/2024)   [employer] of Occupational Health - Occupational Stress Questionnaire    Feeling of Stress : Not at all  Social Connections: Moderately Integrated (03/04/2024)   Social Connection and Isolation Panel    Frequency of Communication with Friends and Family: More than three times a week    Frequency of Social Gatherings with Friends and Family: More than three times a week    Attends Religious Services: More than 4 times per year    Active Member of [employer] or Organizations: Yes  Attends Banker Meetings: More than 4 times per year    Marital Status: Widowed  Intimate Partner Violence: Not At Risk (03/04/2024)   Humiliation, Afraid, Rape, and Kick questionnaire    Fear of Current or Ex-Partner: No    Emotionally Abused: No     Physically Abused: No    Sexually Abused: No   Family History  Problem Relation Age of Onset   Heart disease Mother    Hypertension Mother    Cancer Father    Alcoholism Father    BP 120/78   Pulse 94   Ht 5' 2 (1.575 m)   Wt 101.2 kg (223 lb)   SpO2 97%   BMI 40.79 kg/m   Wt Readings from Last 3 Encounters:  07/04/24 101.2 kg (223 lb)  06/18/24 101.6 kg (224 lb)  06/11/24 101.7 kg (224 lb 3.2 oz)    PHYSICAL EXAM: General:  NAD. No resp difficulty, walked into clinic HEENT: Normal Neck: Supple. No JVD. Cor: Irregular rate & rhythm. No rubs, gallops or murmurs. Lungs: Clear Abdomen: Soft, obese, nontender, nondistended.  Extremities: No cyanosis, clubbing, rash, edema Neuro: Alert & oriented x 3, moves all 4 extremities w/o difficulty. Affect pleasant.  ASSESSMENT & PLAN:  1. 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 Volume looks OK - Continue torsemide  60 mg bid + 20 KCL daily. - Continue Tafamadis and eplonteresen - Continue Farxiga  10 mg daily. Denies GU symptoms.  - Continue Toprol  XL 25 mg daily. - Continue Entresto  24/26 mg bid (previously reduced d/t hypotension)  - Labs today.  2. CKD 4 - Baseline Scr is 2 - Continue Farxiga  - Follows with [facility] - Labs today  3. CAD - Non-obstructive CAD in the past.   - No ASA as she is on warfarin  - No s/s angina - Continue statin, LDL 48 9/24.  - Followed by Dr. Barbaraann   4. Permanent AF - Continue warfarin. Followed by Coumadin  Clinic - Previously on Eliquis  and Xarelto but switch to warfarin due to persistence of LAA clot - Zio 7 day (5/24) showed 100% AFL  burden, HR 40-153 - Rate controlled  5. Obesity  - Body mass index is 40.79 kg/m. - on tirzepatide    6. 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  - No change  Doing well! Follow up in 6 months with Dr. Bensimhon  Kelyn Ponciano M Elizah Mierzwa, FNP  2:45 PM

## 2024-07-03 ENCOUNTER — Telehealth (HOSPITAL_COMMUNITY): Payer: Self-pay | Admitting: *Deleted

## 2024-07-03 NOTE — Telephone Encounter (Signed)
 Called to confirm/remind patient of their appointment at the [facility] on  07/04/24.      Appointment:              [] Confirmed             [x] Left mess              [] No answer/No voice mail             [] Phone not in service   Patient reminded to bring all medications and/or complete list.   Confirmed patient has transportation. Gave directions, instructed to utilize valet parking.

## 2024-07-04 ENCOUNTER — Ambulatory Visit (HOSPITAL_COMMUNITY): Payer: Self-pay | Admitting: Family Medicine

## 2024-07-04 ENCOUNTER — Ambulatory Visit

## 2024-07-04 ENCOUNTER — Other Ambulatory Visit (HOSPITAL_COMMUNITY): Payer: Self-pay

## 2024-07-04 ENCOUNTER — Encounter (HOSPITAL_COMMUNITY): Payer: Self-pay

## 2024-07-04 ENCOUNTER — Ambulatory Visit
Admission: RE | Admit: 2024-07-04 | Discharge: 2024-07-04 | Disposition: A | Discharge status: Home / Self Care | Source: Ambulatory Visit | Attending: Cardiovascular Disease | Admitting: Cardiovascular Disease

## 2024-07-04 VITALS — BP 120/78 | HR 94 | Ht 62.0 in | Wt 223.0 lb

## 2024-07-04 DIAGNOSIS — I43 Cardiomyopathy in diseases classified elsewhere: Secondary | ICD-10-CM | POA: Insufficient documentation

## 2024-07-04 DIAGNOSIS — I340 Nonrheumatic mitral (valve) insufficiency: Secondary | ICD-10-CM | POA: Insufficient documentation

## 2024-07-04 DIAGNOSIS — Z794 Long term (current) use of insulin: Secondary | ICD-10-CM | POA: Diagnosis not present

## 2024-07-04 DIAGNOSIS — D6869 Other thrombophilia: Secondary | ICD-10-CM

## 2024-07-04 DIAGNOSIS — Z7901 Long term (current) use of anticoagulants: Secondary | ICD-10-CM

## 2024-07-04 DIAGNOSIS — E854 Organ-limited amyloidosis: Secondary | ICD-10-CM | POA: Diagnosis not present

## 2024-07-04 DIAGNOSIS — Z79899 Other long term (current) drug therapy: Secondary | ICD-10-CM | POA: Diagnosis not present

## 2024-07-04 DIAGNOSIS — Z6841 Body Mass Index (BMI) 40.0 and over, adult: Secondary | ICD-10-CM | POA: Diagnosis not present

## 2024-07-04 DIAGNOSIS — I5032 Chronic diastolic (congestive) heart failure: Secondary | ICD-10-CM | POA: Diagnosis not present

## 2024-07-04 DIAGNOSIS — I5022 Chronic systolic (congestive) heart failure: Secondary | ICD-10-CM | POA: Insufficient documentation

## 2024-07-04 DIAGNOSIS — I2510 Atherosclerotic heart disease of native coronary artery without angina pectoris: Secondary | ICD-10-CM | POA: Diagnosis not present

## 2024-07-04 DIAGNOSIS — Z7985 Long-term (current) use of injectable non-insulin antidiabetic drugs: Secondary | ICD-10-CM | POA: Insufficient documentation

## 2024-07-04 DIAGNOSIS — I4819 Other persistent atrial fibrillation: Secondary | ICD-10-CM

## 2024-07-04 DIAGNOSIS — E669 Obesity, unspecified: Secondary | ICD-10-CM | POA: Diagnosis not present

## 2024-07-04 DIAGNOSIS — I4821 Permanent atrial fibrillation: Secondary | ICD-10-CM | POA: Diagnosis not present

## 2024-07-04 DIAGNOSIS — E1122 Type 2 diabetes mellitus with diabetic chronic kidney disease: Secondary | ICD-10-CM | POA: Diagnosis not present

## 2024-07-04 DIAGNOSIS — I130 Hypertensive heart and chronic kidney disease with heart failure and stage 1 through stage 4 chronic kidney disease, or unspecified chronic kidney disease: Secondary | ICD-10-CM | POA: Insufficient documentation

## 2024-07-04 DIAGNOSIS — Z7984 Long term (current) use of oral hypoglycemic drugs: Secondary | ICD-10-CM | POA: Diagnosis not present

## 2024-07-04 DIAGNOSIS — N184 Chronic kidney disease, stage 4 (severe): Secondary | ICD-10-CM | POA: Insufficient documentation

## 2024-07-04 LAB — POCT INR: INR: 3.3 — AB (ref 2.0–3.0)

## 2024-07-04 LAB — BASIC METABOLIC PANEL WITH GFR
Anion gap: 10 (ref 5–15)
BUN: 47 mg/dL — ABNORMAL HIGH (ref 8–23)
CO2: 31 mmol/L (ref 22–32)
Calcium: 9 mg/dL (ref 8.9–10.3)
Chloride: 98 mmol/L (ref 98–111)
Creatinine, Ser: 2.27 mg/dL — ABNORMAL HIGH (ref 0.44–1.00)
GFR, Estimated: 21 mL/min — ABNORMAL LOW (ref >60)
Glucose, Bld: 114 mg/dL — ABNORMAL HIGH (ref 70–99)
Potassium: 4.1 mmol/L (ref 3.5–5.1)
Sodium: 139 mmol/L (ref 135–145)

## 2024-07-04 NOTE — Progress Notes (Signed)
 INR 3.3  Please see anticoagulation encounter (USE GREEN TABLET ONLY)  Hold tonight only then Continue taking warfarin 1 tablet (2.5mg ) daily except for 1/2 tablet (1.25mg ) on Thursdays. Recheck INR in 4 weeks. Anticoagulation Clinic [phone]

## 2024-07-04 NOTE — Patient Instructions (Signed)
 Labs done today, your results will be available in MyChart, we will contact you for abnormal readings.   Follow-Up in: 6 months PLEASE CALL OUR OFFICE AROUND January/February TO GET SCHEDULED FOR YOUR MARCH APPOINTMENT. PHONE NUMBER IS [phone] OPTION 2   At the [facility], you and your health needs are our priority. We have a designated team specialized in the treatment of Heart Failure. This Care Team includes your primary Heart Failure Specialized Cardiologist (physician), Advanced Practice Providers (APPs- Physician Assistants and Nurse Practitioners), and Pharmacist who all work together to provide you with the care you need, when you need it.   You may see any of the following providers on your designated Care Team at your next follow up:  Dr. Toribio Fuel Dr. Ezra Shuck Dr. Ria Commander Dr. Odis Brownie Greig Mosses, NP [place]affie Shed, [place] [facility] [place], [place] Beckey Coe, NP [place], NP Tinnie Redman, PharmD   Please be sure to bring in all your medications bottles to every appointment.   Need to Contact Us :  If you have any questions or concerns before your next appointment please send us  a message through [place] or call our office at [phone].    TO LEAVE A MESSAGE FOR THE NURSE SELECT OPTION 2, PLEASE LEAVE A MESSAGE INCLUDING: YOUR NAME DATE OF BIRTH CALL BACK NUMBER REASON FOR CALL**this is important as we prioritize the call backs  YOU WILL RECEIVE A CALL BACK THE SAME DAY AS LONG AS YOU CALL BEFORE 4:00 PM

## 2024-07-04 NOTE — Patient Instructions (Signed)
(  USE GREEN TABLET ONLY)  Hold tonight only then Continue taking warfarin 1 tablet (2.5mg ) daily except for 1/2 tablet (1.25mg ) on Thursdays. Recheck INR in 4 weeks. Anticoagulation Clinic [phone]

## 2024-07-08 ENCOUNTER — Other Ambulatory Visit (HOSPITAL_COMMUNITY): Payer: Self-pay

## 2024-07-09 ENCOUNTER — Ambulatory Visit: Admitting: Family Medicine

## 2024-07-10 ENCOUNTER — Other Ambulatory Visit: Payer: Self-pay

## 2024-07-10 NOTE — Progress Notes (Signed)
 Specialty Pharmacy Refill Coordination Note  Regina [name] is a 83 y.o. female contacted today regarding refills of specialty medication(s) Tafamidis  Meglumine  (Cardiac) (Vyndaqel )   Patient requested Delivery   Delivery date: 07/16/24   Verified address: [address] [place] [address]   Medication will be filled on 09.29.25.

## 2024-07-11 ENCOUNTER — Other Ambulatory Visit (HOSPITAL_COMMUNITY): Payer: Self-pay

## 2024-07-12 ENCOUNTER — Ambulatory Visit: Admitting: Family Medicine

## 2024-07-12 ENCOUNTER — Encounter: Payer: Self-pay | Admitting: Family Medicine

## 2024-07-12 VITALS — BP 126/78 | HR 100 | Temp 97.1°F | Resp 16 | Ht 62.0 in | Wt 224.8 lb

## 2024-07-12 DIAGNOSIS — E1142 Type 2 diabetes mellitus with diabetic polyneuropathy: Secondary | ICD-10-CM | POA: Diagnosis not present

## 2024-07-12 DIAGNOSIS — N184 Chronic kidney disease, stage 4 (severe): Secondary | ICD-10-CM

## 2024-07-12 DIAGNOSIS — G63 Polyneuropathy in diseases classified elsewhere: Secondary | ICD-10-CM

## 2024-07-12 DIAGNOSIS — I130 Hypertensive heart and chronic kidney disease with heart failure and stage 1 through stage 4 chronic kidney disease, or unspecified chronic kidney disease: Secondary | ICD-10-CM | POA: Diagnosis not present

## 2024-07-12 DIAGNOSIS — M159 Polyosteoarthritis, unspecified: Secondary | ICD-10-CM | POA: Diagnosis not present

## 2024-07-12 DIAGNOSIS — I10 Essential (primary) hypertension: Secondary | ICD-10-CM

## 2024-07-12 DIAGNOSIS — Z23 Encounter for immunization: Secondary | ICD-10-CM

## 2024-07-12 DIAGNOSIS — K219 Gastro-esophageal reflux disease without esophagitis: Secondary | ICD-10-CM | POA: Diagnosis not present

## 2024-07-12 DIAGNOSIS — E1140 Type 2 diabetes mellitus with diabetic neuropathy, unspecified: Secondary | ICD-10-CM

## 2024-07-12 DIAGNOSIS — E1122 Type 2 diabetes mellitus with diabetic chronic kidney disease: Secondary | ICD-10-CM | POA: Diagnosis not present

## 2024-07-12 DIAGNOSIS — G629 Polyneuropathy, unspecified: Secondary | ICD-10-CM | POA: Insufficient documentation

## 2024-07-12 DIAGNOSIS — E1169 Type 2 diabetes mellitus with other specified complication: Secondary | ICD-10-CM

## 2024-07-12 NOTE — Progress Notes (Signed)
 Chief Complaint  Patient presents with   Medical Management of Chronic Issues   Discussed the use of AI scribe software for clinical note transcription with the patient, who gave verbal consent to proceed. History of Present Illness Regina [name] is an 83 year old female a PMHx significant for HTN, HLD, DM II, hypothyroidism, CKD IV, CAD, HFrEF, cardiac amyloidosis, PAF, GERD, peripheral neuropathy, and OA  who is here today for chronic disease management.  Last seen on 01/09/24.  She is accompanied by her son. Since her last visit she has seen neurology and cardiologist.  Communication with pulmonologist. Seen for muscle strain of gluteals on 05/24/2024.  HFrEF and LE edema: States that her torsemide  dose was increased to 60 mg twice daily for fluid retention, with doses at 7 AM and 8 hours later.  Hypokalemia on KLOR 20 meq daily. Also on Entresto  24-26 mg bid. Her renal function has been followed and is stable. She is under the care of a nephrologist, who she has also seen recently.  Lab Results  Component Value Date   NA 139 07/04/2024   CL 98 07/04/2024   K 4.1 07/04/2024   CO2 31 07/04/2024   BUN 47 (H) 07/04/2024   CREATININE 2.27 (H) 07/04/2024   GFRNONAA 21 (L) 07/04/2024   CALCIUM  9.0 07/04/2024   ALBUMIN 3.9 05/17/2022   GLUCOSE 114 (H) 07/04/2024   HTN and CAD: She is on metoprolol  succinate 25 mg daily and Imdur  30 mg daily. Atrial fib on coumadin .  Musculoskeletal pain: She was previously on duloxetine  60 mg initially started for for shoulder pain, decreased to 30 mg last visit. She has not taken it in the past month and is unsure of its benefits. She has upper and lower back pain as well as shoulders and hips.. She has not had a fall since her last visit. She uses Aspercreme for pain relief, particularly for her fingers, and finds it effective despite its smell.  Headaches, stable, evaluated by Dr Skeet and according to pt, thought to be related to neck  pain.  She does not take Tylenol  frequently for pain. Which has helped when she does.  Peripheral neuropathy: She is on Gabapentin  600 mg bid.  GERD: pantoprazole  20 mg daily, which helps with symptoms.  DM II and hypothyroidism: Follows with endocrinologist.  She reports difficulty achieving the recommended 5,000 steps per day due to fatigue and pain but attempts to stay active by walking around her home and in stores. She does not walk outside often but uses a cart when necessary.  Review of Systems  Constitutional:  Positive for fatigue. Negative for activity change, appetite change, chills and fever.  Respiratory:  Negative for cough, shortness of breath and wheezing.   Cardiovascular:  Negative for chest pain and palpitations.  Gastrointestinal:  Negative for abdominal pain, nausea and vomiting.  Genitourinary:  Negative for dysuria, hematuria and urgency.  Musculoskeletal:  Positive for arthralgias, back pain and myalgias.  Neurological:  Negative for syncope, facial asymmetry and weakness.  Psychiatric/Behavioral:  Negative for confusion and hallucinations.   See other pertinent positives and negatives in HPI.  Current Outpatient Medications on File Prior to Visit  Medication Sig Dispense Refill   Accu-Chek Softclix Lancets lancets USE TO TEST BLOOD SUGAR UP TO TWO TIMES DAILY AS NEEDED 100 each 5   acetaminophen  (TYLENOL ) 500 MG tablet Take 1,000 mg by mouth every 6 (six) hours as needed for mild pain.     Alcohol   Swabs (B-D SINGLE USE SWABS REGULAR) PADS Use to test blood sugar up to 3 times daily 100 each 3   allopurinol  (ZYLOPRIM ) 100 MG tablet TAKE ONE TABLET BY MOUTH EACH MORNING 90 tablet 3   atorvastatin  (LIPITOR) 20 MG tablet TAKE ONE TABLET BY MOUTH AT BEDTIME 90 tablet 3   Blood Glucose Monitoring Suppl (ACCU-CHEK GUIDE) w/Device KIT Use to check blood sugars 1 kit 0   cetirizine (ZYRTEC) 10 MG chewable tablet Chew 10 mg by mouth daily.     Continuous Glucose Receiver  (FREESTYLE LIBRE 2 READER) DEVI USE TO TEST BLOOD SUGAR AS DIRECTED 1 each 0   Continuous Glucose Sensor (FREESTYLE LIBRE 2 SENSOR) MISC USE TO MONITOR BLOOD SUGAR. CHANGE SENSOR EVERY 14 DAYS 6 each 3   Eplontersen  Sodium (WAINUA ) 45 MG/0.8ML SOAJ Inject 45 mg into the skin every 30 (thirty) days. 2.4 mL 1   FARXIGA  10 MG TABS tablet TAKE ONE TABLET BY MOUTH DAILY 90 tablet 2   Fluocinolone  Acetonide 0.01 % OIL instill one drop in ears every 2-3 DAYS AS NEEDED FOR pruritis 20 mL 1   Furosemide  (FUROSCIX ) 80 MG/10ML CTKT Inject 80 mg into the skin daily.     gabapentin  (NEURONTIN ) 600 MG tablet TAKE ONE TABLET BY MOUTH AT BREAKFAST AND AT BEDTIME 60 tablet 5   glucose blood (ACCU-CHEK GUIDE) test strip Use to test blood sugar up to 2 times daily as needed 100 each 3   insulin  lispro (HUMALOG  KWIKPEN) 100 UNIT/ML KwikPen Inject 4-8 Units into the skin daily as needed. Sliding scale: Max daily 30 units 30 mL 1   Insulin  Pen Needle 32G X 4 MM MISC 1 Device by Does not apply route in the morning, at noon, in the evening, and at bedtime. 400 each 3   isosorbide  mononitrate (IMDUR ) 30 MG 24 hr tablet Take 1 tablet (30 mg total) by mouth daily. 30 tablet 6   Lancets Misc. (ACCU-CHEK FASTCLIX LANCET) KIT Use to test blood sugar up to 2 times daily as needed 1 kit 1   levothyroxine  (SYNTHROID ) 150 MCG tablet TAKE ONE TABLET BY MOUTH BEFORE BREAKFAST 90 tablet 3   magnesium  oxide (MAG-OX) 400 MG tablet TAKE ONE TABLET BY MOUTH EACH MORNING 90 tablet 2   metoprolol  succinate (TOPROL -XL) 25 MG 24 hr tablet TAKE ONE TABLET BY MOUTH ONCE DAILY WITH OR IMMEDAITELY FOLLOWING A MEAL 90 tablet 2   Multiple Vitamin (MULTIVITAMIN) tablet Take 1 tablet by mouth daily. K FREE DAILY once daily (MVI with no vitamin K)     nitroGLYCERIN  (NITROSTAT ) 0.4 MG SL tablet DISSOLVE ONE TABLET UNDER THE TONGUE AS NEEDED FOR CHST PAIN EVERY 5 MINUTES UP TO THREE TIMES. IF NO REFILL CALL 911. 75 tablet 2   pantoprazole  (PROTONIX ) 20  MG tablet TAKE ONE TABLET BY MOUTH DAILY 60 tablet 1   potassium chloride  SA (KLOR-CON  M) 20 MEQ tablet Take 1 tablet (20 mEq total) by mouth daily.     Propylene Glycol (SYSTANE BALANCE OP) Place 1 drop into both eyes daily as needed (for dry eyes).     sacubitril -valsartan  (ENTRESTO ) 24-26 MG Take 1 tablet by mouth 2 (two) times daily. 90 tablet 3   Tafamidis  Meglumine , Cardiac, (VYNDAQEL ) 20 MG CAPS Take 4 capsules (80 mg total) by mouth daily. 120 capsule 11   tirzepatide  (MOUNJARO ) 10 MG/0.5ML Pen Inject 10 mg into the skin once a week. 6 mL 3   torsemide  (DEMADEX ) 20 MG tablet Take 3 tablets (60  mg total) by mouth 2 (two) times daily. 180 tablet 3   trolamine salicylate (ASPERCREME) 10 % cream Apply 1 application. topically as needed for muscle pain.     warfarin (COUMADIN ) 2.5 MG tablet TAKE 1/2 TO 1 (ONE-HALF TO ONE) TABLET BY MOUTH ONCE DAILY AS  DIRECTED 100 tablet 0   No current facility-administered medications on file prior to visit.    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 01/2015 Cardiolite : + inf wall ischemia, EF 56%;  b. 01/2015 Cath: LM nl, LAD 60m, LCX min irregs, RCA dominant, 50-79m.   Sleep apnea    Swallowing difficulty    Allergies  Allergen Reactions   Ace Inhibitors Other (See Comments)    Unknown reaction   Amlodipine Other (See Comments)    Unknown reaction   Atenolol Other (See Comments)    bradycardia   Avandia [Rosiglitazone] Other (See Comments)    Unknown reaction   Darvon [Propoxyphene] Other (See Comments)    Hallucinations    Erythromycin Itching   Hydralazine  Other  (See Comments)    Burning in throat and chest   Hydrocodone Other (See Comments)    Hallucinations.   Levofloxacin Itching   Morphine  And Codeine Other (See Comments)    Dizzy and hallucianation, vomiting; Willing to try low dose   Percocet [Oxycodone-Acetaminophen ] Other (See Comments)    hallucination   Spironolactone Other (See Comments)    Unknown reaction   Tramadol Other (See Comments)    Unknown/does not recall reaction but does not want to take again    Social History   Socioeconomic History   Marital status: Widowed    Spouse name: Not on file   Number of children: Not on file   Years of education: Not on file   Highest education level: Associate degree: academic program  Occupational History   Occupation: Retired  Tobacco Use   Smoking status: Never   Smokeless tobacco: Never  Vaping Use   Vaping status: Never Used  Substance and Sexual Activity   Alcohol  use: No   Drug use: Never   Sexual activity: Not Currently  Other Topics Concern   Not on file  Social History Narrative   Lives with daughter   Social Drivers of Health   Financial Resource Strain: Low Risk  (03/04/2024)   Overall Financial Resource Strain (CARDIA)    Difficulty of Paying Living Expenses: Not hard at all  Food Insecurity: No Food Insecurity (03/04/2024)   Hunger Vital Sign    Worried About Running Out of Food in the Last Year: Never true    Ran Out of Food in the Last Year: Never true  Transportation Needs: No Transportation Needs (03/04/2024)   PRAPARE - Administrator, Civil Service (Medical): No    Lack of Transportation (Non-Medical): No  Physical Activity: Inactive (03/04/2024)   Exercise Vital Sign    Days of Exercise per Week: 0 days    Minutes of Exercise per Session: 0 min  Stress: No Stress Concern Present (03/04/2024)  [employer] of Occupational Health - Occupational Stress Questionnaire    Feeling of Stress : Not at all  Social Connections: Moderately  Integrated (03/04/2024)   Social Connection and Isolation Panel    Frequency of Communication with Friends and Family: More than three times a week    Frequency of Social Gatherings with Friends and Family: More than three times a week    Attends Religious Services: More than 4 times per year    Active Member of [employer] or Organizations: Yes    Attends Banker Meetings: More than 4 times per year    Marital Status: Widowed   Vitals:   07/12/24 0951  BP: 126/78  Pulse: 100  Resp: 16  Temp: (!) 97.1 F (36.2 C)  SpO2: 99%   Body mass index is 41.12 kg/m.  Physical Exam Vitals and nursing note reviewed.  Constitutional:      General: She is not in acute distress.    Appearance: She is well-developed.  HENT:     Head: Normocephalic and atraumatic.     Mouth/Throat:     Mouth: Mucous membranes are moist.  Eyes:     Conjunctiva/sclera: Conjunctivae normal.  Cardiovascular:     Rate and Rhythm: Normal rate. Rhythm irregular.     Pulses:          Dorsalis pedis pulses are 2+ on the right side and 2+ on the left side.     Heart sounds: No murmur heard.    Comments: Trace pitting LE edema, bilateral. Pulmonary:     Effort: Pulmonary effort is normal. No respiratory distress.     Breath sounds: Normal breath sounds.  Abdominal:     Palpations: Abdomen is soft. There is no hepatomegaly or mass.     Tenderness: There is no abdominal tenderness.  Musculoskeletal:     Comments: NO signs of synovitis.  Skin:    General: Skin is warm.     Findings: No erythema or rash.  Neurological:     General: No focal deficit present.     Mental Status: She is alert and oriented to person, place, and time.     Cranial Nerves: No cranial nerve deficit.     Gait: Gait normal.  Psychiatric:        Mood and Affect: Mood and affect normal.   ASSESSMENT AND PLAN:  Regina [name] was seen today for medical management of chronic issues.  Diagnoses and all orders for this  visit:  Generalized osteoarthritis of multiple sites Assessment & Plan: We discussed Dx,prognosis,and treatment options. Duloxetine  d/c a month ago and she has not noted differences, so do not resume. Because chronic co morbilities and risk of med interaction she does not have may options in regard to treatment. Acetaminophen  500 mg 3-4 times per day as needed recommended. Low impact exercise like tai chi may also help. Continue topical asper cream.   Gastroesophageal reflux disease, unspecified whether esophagitis present Assessment & Plan: Problem has improved, no symptoms reported today. Continue Pantoprazole  20 mg 30 min before lunch. GERD precautions to continue.   Polyneuropathy associated with underlying disease Assessment & Plan: Problem has been going on for years. Hx of DM II and amyloidosis. Currently on Gabapentin  600 mg bid, which seems to be helping. Continue appropriate skin care.   CKD (chronic kidney disease) stage 4, GFR 15-29 ml/min (HCC) Assessment & Plan: Stable. Continue low salt diet, adequate hydration,and avoidance of NSAID's. Currently on Farxiga  10 mg daily and  Valsartan  (Entresto ). Following with nephrologist regularly.   Essential hypertension Assessment & Plan: BP adequately controlled. Continue Metoprolol  succinate 25 mg daily, Entresto  24-26 mg bid (for HFrEF), and Imdur  30 mg daily and low salt diet. Follows with cardiologist and nephrologist.   Needs flu shot -     Flu vaccine HIGH DOSE PF(Fluzone Trivalent)   I personally spent a total of 33 minutes in the care of the patient today including preparing to see the patient, getting/reviewing separately obtained history, performing a medically appropriate exam/evaluation, counseling and educating, and documenting clinical information in the EHR.  Return in about 1 year (around 07/12/2025) for chronic problems, CPE.  Armonie Mettler G. [place], MD  [facility]. [place] office.

## 2024-07-12 NOTE — Patient Instructions (Signed)
 A few things to remember from today's visit:  Type 2 diabetes mellitus with diabetic neuropathy, with long-term current use of insulin  (HCC) - Plan: CANCELED: POC HgB A1c  Generalized osteoarthritis of multiple sites Acetaminophen  500 mg 3-4 tabs daily as needed. Tai Chi is a good option for exercise. Fall prevention.  If you need refills for medications you take chronically, please call your pharmacy. Do not use My Chart to request refills or for acute issues that need immediate attention. If you send a my chart message, it may take a few days to be addressed, specially if I am not in the office.  Please be sure medication list is accurate. If a new problem present, please set up appointment sooner than planned today.

## 2024-07-13 DIAGNOSIS — M159 Polyosteoarthritis, unspecified: Secondary | ICD-10-CM | POA: Insufficient documentation

## 2024-07-13 NOTE — Assessment & Plan Note (Signed)
 Problem has been going on for years. Hx of DM II and amyloidosis. Currently on Gabapentin  600 mg bid, which seems to be helping. Continue appropriate skin care.

## 2024-07-13 NOTE — Assessment & Plan Note (Signed)
 We discussed Dx,prognosis,and treatment options. Duloxetine  d/c a month ago and she has not noted differences, so do not resume. Because chronic co morbilities and risk of med interaction she does not have may options in regard to treatment. Acetaminophen  500 mg 3-4 times per day as needed recommended. Low impact exercise like tai chi may also help. Continue topical asper cream.

## 2024-07-13 NOTE — Assessment & Plan Note (Addendum)
 Stable. Continue low salt diet, adequate hydration,and avoidance of NSAID's. Currently on Farxiga  10 mg daily and Valsartan  (Entresto ). Following with nephrologist regularly.

## 2024-07-13 NOTE — Assessment & Plan Note (Signed)
 Problem has improved, no symptoms reported today. Continue Pantoprazole  20 mg 30 min before lunch. GERD precautions to continue.

## 2024-07-13 NOTE — Assessment & Plan Note (Signed)
 BP adequately controlled. Continue Metoprolol  succinate 25 mg daily, Entresto  24-26 mg bid (for HFrEF), and Imdur  30 mg daily and low salt diet. Follows with cardiologist and nephrologist.

## 2024-07-15 ENCOUNTER — Other Ambulatory Visit: Payer: Self-pay

## 2024-07-16 ENCOUNTER — Telehealth: Payer: Self-pay | Admitting: Internal Medicine

## 2024-07-16 NOTE — Telephone Encounter (Signed)
 MEDICATION: FreeStyle Libre 3 Plus  PHARMACY:    [facility] [place], [place] - [address] [place] (Ph: [phone])    HAS THE PATIENT CONTACTED THEIR PHARMACY?  Yes  LAST REFILL:  @@LASTREFILL @  IS THIS A 90 DAY SUPPLY : Yes  IS PATIENT OUT OF MEDICATION: Yes  IF NOT; HOW MUCH IS LEFT:   LAST APPOINTMENT DATE: @9 /11/2023  NEXT APPOINTMENT DATE:@3 /07/2025  DO WE HAVE YOUR PERMISSION TO LEAVE A DETAILED MESSAGE?: Patient states that she she was told by the supplier for the FreeStyle libre 2 that she needed an upgraded prescription for the FreeStyle Libre 3 Plus.  OTHER COMMENTS:    **Let patient know to contact pharmacy at the end of the day to make sure medication is ready. **  ** Please notify patient to allow 48-72 hours to process**  **Encourage patient to contact the pharmacy for refills or they can request refills through [facility]**

## 2024-07-17 ENCOUNTER — Other Ambulatory Visit (HOSPITAL_COMMUNITY): Payer: Self-pay

## 2024-07-17 ENCOUNTER — Telehealth: Payer: Self-pay | Admitting: Cardiovascular Disease

## 2024-07-17 ENCOUNTER — Other Ambulatory Visit: Payer: Self-pay | Admitting: Cardiovascular Disease

## 2024-07-17 DIAGNOSIS — I4821 Permanent atrial fibrillation: Secondary | ICD-10-CM

## 2024-07-17 MED ORDER — WARFARIN SODIUM 2.5 MG PO TABS: ORAL_TABLET | ORAL | 1 refills | Status: AC

## 2024-07-17 MED ORDER — WARFARIN SODIUM 2.5 MG PO TABS
ORAL_TABLET | ORAL | 1 refills | Status: DC
  Filled 2024-07-17: qty 100, 100d supply, fill #0

## 2024-07-17 MED ORDER — FREESTYLE LIBRE 3 PLUS SENSOR MISC: 3 refills | Status: AC

## 2024-07-17 NOTE — Telephone Encounter (Signed)
 Refill sent to [facility] per request.

## 2024-07-17 NOTE — Addendum Note (Signed)
 Addended by: ROBYNN OLAM HERO on: 07/17/2024 02:48 PM   Modules accepted: Orders

## 2024-07-17 NOTE — Telephone Encounter (Signed)
 Refill request for warfarin:  Last INR was 3.3 on 07/04/24 Next INR due 08/01/24 LOV was 07/04/24  Refill approved.

## 2024-07-17 NOTE — Telephone Encounter (Signed)
 Warfarin 2.5mg  refill Afib Last INR 07/04/24 Last OV 07/04/24

## 2024-07-17 NOTE — Telephone Encounter (Signed)
*  STAT* If patient is at the pharmacy, call can be transferred to refill team.   1. Which medications need to be refilled? (please list name of each medication and dose if known)   warfarin (COUMADIN ) 2.5 MG tablet     2. Would you like to learn more about the convenience, safety, & potential cost savings by using the [facility]? No    3. Are you open to using the [facility] (Type [facility]. No   4. Which pharmacy/location (including street and city if local pharmacy) is medication to be sent to?[facility] [address] [place] - [address]    5. Do they need a 30 day or 90 day supply? 90 day

## 2024-07-19 ENCOUNTER — Other Ambulatory Visit: Payer: Self-pay | Admitting: Internal Medicine

## 2024-07-19 DIAGNOSIS — E1140 Type 2 diabetes mellitus with diabetic neuropathy, unspecified: Secondary | ICD-10-CM

## 2024-07-19 NOTE — Telephone Encounter (Signed)
 Patient called as she has had problems with her Libre CGM.  She has contacted the [place] company who replaced the initial [place] 2 reader but it did not work.  Her prescription has been changed to the [place] 3 plus but she needs the reader as she does not use her phone app.  She also asks if she can eat [employer].  Discussed that Donell Redgie Butts, MD  does not wish for her to eat snacks.  One serving is 15 grams of carbohydrates.  If she eats this, she should see the response on the [place].  She should only eat this if hungry but overall avoid snacks as her goal is to lose weight.  No further questions at this time.  Leita Constable, RD, LDN, CDCES, DipACLM

## 2024-08-01 ENCOUNTER — Ambulatory Visit: Attending: Cardiovascular Disease

## 2024-08-01 DIAGNOSIS — D6869 Other thrombophilia: Secondary | ICD-10-CM

## 2024-08-01 DIAGNOSIS — Z7901 Long term (current) use of anticoagulants: Secondary | ICD-10-CM | POA: Diagnosis not present

## 2024-08-01 DIAGNOSIS — I4819 Other persistent atrial fibrillation: Secondary | ICD-10-CM

## 2024-08-01 LAB — POCT INR: INR: 3.2 — AB (ref 2.0–3.0)

## 2024-08-01 NOTE — Patient Instructions (Signed)
(  USE GREEN TABLET ONLY)  Decrease to 1 tablet (2.5mg ) daily except for 1/2 tablet (1.25mg ) on Tuesdays and Thursdays. Recheck INR in 3 weeks. Anticoagulation Clinic [phone]

## 2024-08-01 NOTE — Progress Notes (Signed)
 INR 3.2  Please see anticoagulation encounter (USE GREEN TABLET ONLY)  Decrease to 1 tablet (2.5mg ) daily except for 1/2 tablet (1.25mg ) on Tuesdays and Thursdays. Recheck INR in 3 weeks. Anticoagulation Clinic [phone]

## 2024-08-06 ENCOUNTER — Telehealth: Payer: Self-pay

## 2024-08-06 ENCOUNTER — Other Ambulatory Visit (HOSPITAL_COMMUNITY): Payer: Self-pay

## 2024-08-06 ENCOUNTER — Telehealth: Payer: Self-pay | Admitting: Dietician

## 2024-08-06 NOTE — Telephone Encounter (Signed)
 Returned patient call.  She states that an email was received for a prior authorization for the [place] 3. Chart reviewed which states that status is pending.   Leita Constable, RD, LDN, CDCES, DipACLM

## 2024-08-06 NOTE — Telephone Encounter (Signed)
 Pharmacy Patient Advocate Encounter   Received notification from RX Request Messages that prior authorization for Freestyle libre 3 plus sensor is required/requested.   Insurance verification completed.   The patient is insured through [facility].   Per test claim: PA required; PA submitted to above mentioned insurance via Latent Key/confirmation #/EOC [id number] Status is pending

## 2024-08-06 NOTE — Telephone Encounter (Signed)
Libre 3 needs PA

## 2024-08-07 ENCOUNTER — Other Ambulatory Visit: Payer: Self-pay

## 2024-08-07 ENCOUNTER — Ambulatory Visit (INDEPENDENT_AMBULATORY_CARE_PROVIDER_SITE_OTHER)

## 2024-08-07 ENCOUNTER — Ambulatory Visit (HOSPITAL_COMMUNITY)
Admission: EM | Admit: 2024-08-07 | Discharge: 2024-08-07 | Disposition: A | Discharge status: Home / Self Care | Attending: Family Medicine | Admitting: Family Medicine

## 2024-08-07 ENCOUNTER — Other Ambulatory Visit: Payer: Self-pay | Admitting: Family Medicine

## 2024-08-07 ENCOUNTER — Ambulatory Visit (HOSPITAL_COMMUNITY)

## 2024-08-07 ENCOUNTER — Encounter (HOSPITAL_COMMUNITY): Payer: Self-pay | Admitting: Emergency Medicine

## 2024-08-07 DIAGNOSIS — M7732 Calcaneal spur, left foot: Secondary | ICD-10-CM | POA: Diagnosis not present

## 2024-08-07 DIAGNOSIS — M79642 Pain in left hand: Secondary | ICD-10-CM | POA: Diagnosis not present

## 2024-08-07 DIAGNOSIS — S99912A Unspecified injury of left ankle, initial encounter: Secondary | ICD-10-CM | POA: Diagnosis not present

## 2024-08-07 DIAGNOSIS — M25562 Pain in left knee: Secondary | ICD-10-CM | POA: Diagnosis not present

## 2024-08-07 DIAGNOSIS — Z96652 Presence of left artificial knee joint: Secondary | ICD-10-CM | POA: Diagnosis not present

## 2024-08-07 DIAGNOSIS — M25572 Pain in left ankle and joints of left foot: Secondary | ICD-10-CM

## 2024-08-07 DIAGNOSIS — M25511 Pain in right shoulder: Secondary | ICD-10-CM

## 2024-08-07 DIAGNOSIS — S6992XA Unspecified injury of left wrist, hand and finger(s), initial encounter: Secondary | ICD-10-CM | POA: Diagnosis not present

## 2024-08-07 DIAGNOSIS — Z96611 Presence of right artificial shoulder joint: Secondary | ICD-10-CM | POA: Diagnosis not present

## 2024-08-07 DIAGNOSIS — M85811 Other specified disorders of bone density and structure, right shoulder: Secondary | ICD-10-CM | POA: Diagnosis not present

## 2024-08-07 DIAGNOSIS — S93402A Sprain of unspecified ligament of left ankle, initial encounter: Secondary | ICD-10-CM | POA: Diagnosis not present

## 2024-08-07 DIAGNOSIS — S8992XA Unspecified injury of left lower leg, initial encounter: Secondary | ICD-10-CM | POA: Diagnosis not present

## 2024-08-07 DIAGNOSIS — M85842 Other specified disorders of bone density and structure, left hand: Secondary | ICD-10-CM | POA: Diagnosis not present

## 2024-08-07 NOTE — Medical Student Note (Signed)
 [facility] Insurance account manager Note For educational purposes for Medical, PA and NP students only and not part of the legal medical record.   CSN: [id number] Arrival date & time: 08/07/24  1501      History   Chief Complaint Chief Complaint  Patient presents with   Fall   Appointment    3:30    HPI Regina [name] is a 83 y.o. female.  Pt has a hx of DM2, CHF, HTN, hypothyroidism, CAD, and is anticoagulated with coumadin . Last INR 3.2. Yesterday pt fell down 4 stairs when her foot slipped out from under her. She was not dizzy or weak prior to the fall. She landed on her R shoulder and the tumbled to her L side. She is adamant that she did not hit her head in the fall. She denies any LOC, vomiting, or HA afterwards. She initially had pain in her R shoulder, L hand, and L ankle. This morning she is having more generalized muscle soreness and stiffness. She has been able to ambulate with her walker to get to the clinic today. She has not had any other falls recently.   The history is provided by the patient.  Fall Pertinent negatives include no chest pain, no headaches and no shortness of breath.    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 01/2015 Cardiolite : + inf wall ischemia, EF 56%;  b. 01/2015 Cath: LM nl, LAD 39m, LCX min irregs, RCA dominant, 50-48m.   Sleep apnea    Swallowing difficulty     Patient Active Problem List   Diagnosis Date Noted   Peripheral neuropathy 07/12/2024   Breast tenderness in female 10/30/2023   Acquired thrombophilia 02/10/2023   Multiple lipomas  10/04/2022   Dizziness, nonspecific 09/22/2022   Dysphagia 09/22/2022   Left shoulder pain 09/22/2022   Pain due to onychomycosis of toenail of left foot 02/11/2022   Chest pain 12/26/2021   Left atrial thrombus 12/25/2021   Wild-type transthyretin-related (ATTR) amyloidosis (HCC)    Lung nodules 12/13/2021   Polymyalgia rheumatica 06/30/2021   Neck pain 06/18/2021   Gait abnormality 06/18/2021   Memory loss 06/18/2021   Atherosclerosis of aorta 06/09/2021   Type 2 diabetes mellitus with stage 3a chronic kidney disease, with long-term current use of insulin  (HCC) 09/14/2020   Type 2 diabetes mellitus with retinopathy, with long-term current use of insulin  (HCC) 09/14/2020   Diabetes mellitus (HCC) 09/14/2020   Type 2 diabetes mellitus with diabetic polyneuropathy, with long-term current use of insulin  (HCC) 09/14/2020   Long term (current) use of anticoagulants 07/13/2020   Persistent atrial fibrillation (HCC)    Mixed hyperlipidemia    Demand ischemia (HCC)    Atrial fibrillation (HCC) 04/24/2020   Other fatigue 03/24/2020   Short of breath on exertion 03/24/2020   Congestive heart failure (HCC) 03/24/2020   Vitamin D  deficiency 03/24/2020   Sleep apnea 05/21/2019   History of hepatitis C 05/21/2019   Insomnia 05/21/2019   CKD (chronic kidney disease) stage 4, GFR 15-29  ml/min (HCC) 05/21/2019   GERD (gastroesophageal reflux disease) 04/20/2018   Type 2 diabetes mellitus with diabetic neuropathy, unspecified (HCC) 12/19/2016   History of TIA (transient ischemic attack) 12/19/2016   Hyperlipidemia associated with type 2 diabetes mellitus (HCC) 12/19/2016   S/P total knee replacement using cement, right 03/18/2015   Non-obstructive CAD    Cardiovascular stress test abnormal    Pleuritic chest pain 01/16/2015   Acute renal failure superimposed on stage 3 chronic kidney disease (HCC) 01/16/2015   Obesity, Class III, BMI 40-49.9 (morbid obesity) (HCC) 01/16/2015   Essential  hypertension 01/16/2015   Hypothyroidism 01/16/2015   OSA on CPAP 01/16/2015   DM type 2 (diabetes mellitus, type 2) (HCC) 01/16/2015   Coronary artery disease involving native coronary artery without angina pectoris 10/02/2014   Physical deconditioning 02/28/2013   Morbid obesity (HCC) 02/27/2013   Generalized osteoarthritis of multiple sites 02/27/2013    Past Surgical History:  Procedure Laterality Date   ABDOMINAL HYSTERECTOMY     BUBBLE STUDY  07/01/2020   Procedure: BUBBLE STUDY;  Surgeon: Loni Soyla LABOR, MD;  Location: [facility] ENDOSCOPY;  Service: Cardiovascular;;   BUBBLE STUDY  08/05/2020   Procedure: BUBBLE STUDY;  Surgeon: Barbaraann Darryle Ned, MD;  Location: [facility] ENDOSCOPY;  Service: Cardiovascular;;  done with definity     CARDIAC CATHETERIZATION     ESOPHAGOGASTRODUODENOSCOPY     a. 01/2015 EGD: patent esophagus.   ESOPHAGOGASTRODUODENOSCOPY N/A 01/20/2015   Procedure: ESOPHAGOGASTRODUODENOSCOPY (EGD);  Surgeon: Belvie Just, MD;  Location: [facility] ENDOSCOPY;  Service: Endoscopy;  Laterality: N/A;   HAND SURGERY     JOINT REPLACEMENT     reports history bilateral TKA and right TSA   LEFT HEART CATHETERIZATION WITH CORONARY ANGIOGRAM N/A 01/19/2015   RIGHT HEART CATH N/A 12/27/2021   Procedure: RIGHT HEART CATH;  Surgeon: Court Dorn PARAS, MD;  Location: [facility] INVASIVE CV LAB;  Service: Cardiovascular;  Laterality: N/A;   TEE WITHOUT CARDIOVERSION  05/04/2020   TEE WITHOUT CARDIOVERSION N/A 05/05/2020   Procedure: TRANSESOPHAGEAL ECHOCARDIOGRAM (TEE);  Surgeon: Mona Vinie BROCKS, MD;  Location: [facility] ENDOSCOPY;  Service: Cardiovascular;  Laterality: N/A;   TEE WITHOUT CARDIOVERSION N/A 07/01/2020   Procedure: TRANSESOPHAGEAL ECHOCARDIOGRAM (TEE);  Surgeon: Loni Soyla LABOR, MD;  Location: [facility] ENDOSCOPY;  Service: Cardiovascular;  Laterality: N/A;   TEE WITHOUT CARDIOVERSION N/A 08/05/2020   Procedure: TRANSESOPHAGEAL ECHOCARDIOGRAM (TEE);  Surgeon: Barbaraann Darryle Ned, MD;  Location: [facility]  ENDOSCOPY;  Service: Cardiovascular;  Laterality: N/A;   THYROIDECTOMY      OB History     Gravida  6   Para      Term      Preterm      AB      Living         SAB      IAB      Ectopic      Multiple      Live Births               Home Medications    Prior to Admission medications   Medication Sig Start Date End Date Taking? Authorizing Provider  Accu-Chek Softclix Lancets lancets USE TO TEST BLOOD SUGAR UP TO TWO TIMES DAILY AS NEEDED 01/12/24   Shamleffer, Donell Cardinal, MD  acetaminophen  (TYLENOL ) 500 MG tablet Take 1,000 mg by mouth every 6 (six) hours as needed for mild pain.    [provider]  Alcohol  Swabs (B-D SINGLE USE SWABS REGULAR) PADS Use to test blood sugar up  to 3 times daily 01/11/21   Shamleffer, Donell Cardinal, MD  allopurinol  (ZYLOPRIM ) 100 MG tablet TAKE ONE TABLET BY MOUTH EACH MORNING 05/15/24   [place], [name], MD  atorvastatin  (LIPITOR) 20 MG tablet TAKE ONE TABLET BY MOUTH AT BEDTIME 06/14/24   Hayes Beckey CROME, NP  Blood Glucose Monitoring Suppl (ACCU-CHEK GUIDE) w/Device KIT Use to check blood sugars 07/25/23   Shamleffer, Ibtehal Jaralla, MD  cetirizine (ZYRTEC) 10 MG chewable tablet Chew 10 mg by mouth daily.    [provider]  Continuous Glucose Receiver (FREESTYLE LIBRE 2 READER) DEVI USE TO TEST BLOOD SUGAR AS DIRECTED 07/19/24   Shamleffer, Donell Cardinal, MD  Continuous Glucose Sensor (FREESTYLE LIBRE 2 SENSOR) MISC USE TO MONITOR BLOOD SUGAR. CHANGE SENSOR EVERY 14 DAYS 12/14/23   Shamleffer, Ibtehal Jaralla, MD  Continuous Glucose Sensor (FREESTYLE LIBRE 3 PLUS SENSOR) MISC Change sensor every 15 days. 07/17/24   Shamleffer, Ibtehal Jaralla, MD  Eplontersen  Sodium (WAINUA ) 45 MG/0.8ML SOAJ Inject 45 mg into the skin every 30 (thirty) days. 12/04/23   O'NealDarryle Ned, MD  FARXIGA  10 MG TABS tablet TAKE ONE TABLET BY MOUTH DAILY 07/02/24   Bensimhon, Daniel R, MD  Fluocinolone  Acetonide 0.01 % OIL instill one drop  in ears every 2-3 DAYS AS NEEDED FOR pruritis 04/11/22   [place], [name], MD  Furosemide  (FUROSCIX ) 80 MG/10ML CTKT Inject 80 mg into the skin daily. 06/05/24   Glena Harlene HERO, FNP  gabapentin  (NEURONTIN ) 600 MG tablet TAKE ONE TABLET BY MOUTH AT [facility] AND AT BEDTIME 05/15/24   [place], [name], MD  glucose blood (ACCU-CHEK GUIDE) test strip Use to test blood sugar up to 2 times daily as needed 07/25/23   Shamleffer, Ibtehal Jaralla, MD  insulin  lispro (HUMALOG  KWIKPEN) 100 UNIT/ML KwikPen Inject 4-8 Units into the skin daily as needed. Sliding scale: Max daily 30 units 07/06/23   Shamleffer, Ibtehal Jaralla, MD  Insulin  Pen Needle 32G X 4 MM MISC 1 Device by Does not apply route in the morning, at noon, in the evening, and at bedtime. 03/06/24   Shamleffer, Ibtehal Jaralla, MD  isosorbide  mononitrate (IMDUR ) 30 MG 24 hr tablet Take 1 tablet (30 mg total) by mouth daily. 02/06/24   Bensimhon, Toribio SAUNDERS, MD  Lancets Misc. (ACCU-CHEK FASTCLIX LANCET) KIT Use to test blood sugar up to 2 times daily as needed 07/25/23   Shamleffer, Ibtehal Jaralla, MD  levothyroxine  (SYNTHROID ) 150 MCG tablet TAKE ONE TABLET BY MOUTH BEFORE BREAKFAST 06/06/23   [place], [name], MD  magnesium  oxide (MAG-OX) 400 MG tablet TAKE ONE TABLET BY MOUTH EACH MORNING 08/07/24   [place], [name], MD  metoprolol  succinate (TOPROL -XL) 25 MG 24 hr tablet TAKE ONE TABLET BY MOUTH ONCE DAILY WITH OR IMMEDAITELY FOLLOWING A MEAL 06/19/24   Bensimhon, Toribio SAUNDERS, MD  Multiple Vitamin (MULTIVITAMIN) tablet Take 1 tablet by mouth daily. K FREE DAILY once daily (MVI with no vitamin K)    [provider]  nitroGLYCERIN  (NITROSTAT ) 0.4 MG SL tablet DISSOLVE ONE TABLET UNDER THE TONGUE AS NEEDED FOR CHST PAIN EVERY 5 MINUTES UP TO THREE TIMES. IF NO REFILL CALL 911. 12/08/23   Bensimhon, Toribio SAUNDERS, MD  pantoprazole  (PROTONIX ) 20 MG tablet TAKE ONE TABLET BY MOUTH DAILY 06/26/24   [place], [name], MD  potassium chloride  SA (KLOR-CON  M) 20 MEQ tablet  Take 1 tablet (20 mEq total) by mouth daily. 05/09/24   Colletta Manuelita Garre, PA-C  Propylene Glycol (SYSTANE BALANCE OP)  Place 1 drop into both eyes daily as needed (for dry eyes).    [provider]  sacubitril -valsartan  (ENTRESTO ) 24-26 MG Take 1 tablet by mouth 2 (two) times daily. 06/05/23   Bensimhon, Toribio SAUNDERS, MD  Tafamidis  Meglumine , Cardiac, (VYNDAQEL ) 20 MG CAPS Take 4 capsules (80 mg total) by mouth daily. 10/26/23   O'NealDarryle Ned, MD  tirzepatide  (MOUNJARO ) 10 MG/0.5ML Pen Inject 10 mg into the skin once a week. 06/18/24   Shamleffer, Ibtehal Jaralla, MD  torsemide  (DEMADEX ) 20 MG tablet Take 3 tablets (60 mg total) by mouth 2 (two) times daily. 06/11/24   Hayes Beckey CROME, NP  trolamine salicylate (ASPERCREME) 10 % cream Apply 1 application. topically as needed for muscle pain.    [provider]  warfarin (COUMADIN ) 2.5 MG tablet TAKE 1/2 TO 1 (ONE-HALF TO ONE) TABLET BY MOUTH ONCE DAILY 07/17/24   O'Neal, Darryle Ned, MD    Family History Family History  Problem Relation Age of Onset   Heart disease Mother    Hypertension Mother    Cancer Father    Alcoholism Father     Social History Social History   Tobacco Use   Smoking status: Never   Smokeless tobacco: Never  Vaping Use   Vaping status: Never Used  Substance Use Topics   Alcohol  use: No   Drug use: Never     Allergies   Ace inhibitors, Amlodipine, Atenolol, Avandia [rosiglitazone], Darvon [propoxyphene], Erythromycin, Hydralazine , Hydrocodone, Levofloxacin, Morphine  and codeine, Percocet [oxycodone-acetaminophen ], Spironolactone, and Tramadol   Review of Systems Review of Systems  Constitutional:  Negative for chills and fever.  Respiratory:  Negative for shortness of breath.   Cardiovascular:  Negative for chest pain.  Gastrointestinal:  Negative for nausea and vomiting.  Musculoskeletal:  Positive for arthralgias, back pain, joint swelling and myalgias.  Neurological:  Negative for  dizziness, syncope, weakness, light-headedness and headaches.     Physical Exam Updated Vital Signs BP 119/60 (BP Location: Left Arm)   Pulse 93   Temp 98.2 F (36.8 C) (Oral)   Resp 20   SpO2 94%   Physical Exam Constitutional:      Appearance: Normal appearance.  HENT:     Head: Atraumatic.     Comments: Negative Battle's sign, racoon eyes, or any other sign of trauma.  Cardiovascular:     Rate and Rhythm: Normal rate and regular rhythm.     Pulses: Normal pulses.     Heart sounds: Normal heart sounds.  Pulmonary:     Effort: Pulmonary effort is normal.     Breath sounds: Normal breath sounds.  Abdominal:     General: Bowel sounds are normal.     Palpations: Abdomen is soft.     Tenderness: There is abdominal tenderness (mild periumbilical tenderness without obvious signs of trauma). There is no guarding.     Comments: Negative for Cullen, Gray-Turner, bruising or other signs of obvious trauma  Musculoskeletal:     Right shoulder: Tenderness present. Decreased range of motion.     Left shoulder: Normal.     Right hand: Normal.     Left hand: Swelling and tenderness present. Normal strength. Decreased sensation. Normal capillary refill.     Cervical back: Neck supple. No tenderness or bony tenderness.     Thoracic back: No bony tenderness.     Lumbar back: Tenderness present. No bony tenderness.     Right knee: Normal.     Left knee: Tenderness present.  Instability Tests: Anterior drawer test negative. Posterior drawer test negative.     Right ankle: Normal.     Left ankle: Swelling present. Tenderness present.     Comments: R shoulder: tenderness to the superior and posterior aspects without crepitus or deformity. ROM limited by pain abduction and flexion.   L hand: Swelling over the MCPs of the 2nd-4th digit. Markedly decreased sensation to the 3rd digit. Full digital ROM and digital flexion/extension is 5/5 strength.  Lumbar: Bilateral paraspinal tenderness  present. No obvious signs of trauma present.   L knee: tenderness to posterior and lateral aspects of the knee. ROM limited by pain to flexion and extension.   L ankle: marked swelling with tenderness medial, anterior, and lateral aspects.       Skin:    General: Skin is warm and dry.     Capillary Refill: Capillary refill takes less than 2 seconds.  Neurological:     General: No focal deficit present.     Mental Status: She is alert and oriented to person, place, and time.     GCS: GCS eye subscore is 4. GCS verbal subscore is 5. GCS motor subscore is 6.     Cranial Nerves: Cranial nerves 2-12 are intact.     Sensory: Sensation is intact.     Motor: Motor function is intact.     Coordination: Coordination is intact.     Gait: Gait is intact.     Comments: Bilateral upper and lower extremity strength 5/5. Pt ambulatory at baseline with assist of a cane.       ED Treatments / Results  Labs (all labs ordered are listed, but only abnormal results are displayed) Labs Reviewed - No data to display  EKG  Radiology No results found.  Procedures Procedures (including critical care time)  Medications Ordered in ED Medications - No data to display   Initial Impression / Assessment and Plan / ED Course  I have reviewed the triage vital signs and the nursing notes.  Pertinent labs & imaging results that were available during my care of the patient were reviewed by me and considered in my medical decision making (see chart for details).    Imaging negative for acute fractures. Discussed potential arm sling for the right shoulder injury, however she is R arm dominate and uses that hand for her cane. Shared decision making utilized to defer arm sling at this juncture.   L ankle sprain. Will place pt in ASO for support. Counseled pt on ice and elevation. APAP every 8 hrs PRN for pain. Also recommended lidocaine  patches for symptomatic pain relief. Follow up with PCP or sports  medicine in 1 week if symptoms do not improve. Red flag symptoms reviewed and return precautions given.    Final Clinical Impressions(s) / ED Diagnoses   Final diagnoses:  None    New Prescriptions New Prescriptions   No medications on file

## 2024-08-07 NOTE — ED Triage Notes (Signed)
 Patient fell down 4 steps .  Incident occurred yesterday.  Surfaces were carpeted.  Patient thins she slipped.  Patient is hurting all over.  Pain in right shoulder, left hand, left ankle and side of left foot, left knee  Has taken tylenol , and Aspercreme , and Epson salts  Patient is on coumadin   Ring on left hand/ring finger was removed using lubricant and family member.  Family member has ring

## 2024-08-08 NOTE — ED Provider Notes (Signed)
 [facility]   [id number] 08/07/24 Arrival Time: 1501  ASSESSMENT & PLAN:  1. Left hand pain   2. Acute left ankle pain   3. Acute pain of left knee   4. Acute pain of right shoulder    I have personally viewed and independently interpreted the imaging studies ordered this visit. L hand: no fx appreciated. L ankle: no fx appreciated. L knee: no fx appreciated. R shoulder: no fx appreciated.  Prefers OTC analgesics.  Orders Placed This Encounter  Procedures   DG Hand Complete Left   DG Ankle Complete Left   DG Knee Complete 4 Views Left   DG Shoulder Right   Apply ASO lace-up ankle brace   WBAT.  Recommend:  Follow-up Information     Schedule an appointment as soon as possible for a visit  with [place], [name], MD.   Specialty: Family Medicine Why: For follow up. Contact information: [address] [place] [address] [phone]         [facility].   Why: If worsening or failing to improve as anticipated. Contact information: [address] [place]  [address] [phone]                Reviewed expectations re: course of current medical issues. Questions answered. Outlined signs and symptoms indicating need for more acute intervention. Patient verbalized understanding. After Visit Summary given.  SUBJECTIVE: History from: patient.  HPI Regina [name] is a 83 y.o. female.   Pt has a hx of DM2, CHF, HTN, hypothyroidism, CAD, and is anticoagulated with coumadin . Last INR 3.2. Yesterday pt fell down 4 stairs when her foot slipped out from under her. She was not dizzy or weak prior to the fall. She landed on her R shoulder and the tumbled to her L side. She is adamant that she did not hit her head in the fall. She denies any LOC, vomiting, or HA afterwards. She initially had pain in her R shoulder, L hand, and L ankle. This morning she is having more generalized muscle  soreness and stiffness. She has been able to ambulate with her walker to get to the clinic today. She has not had any other falls recently.   The history is provided by the patient.  Fall Pertinent negatives include no chest pain, no headaches and no shortness of breath.         Past Surgical History:  Procedure Laterality Date   ABDOMINAL HYSTERECTOMY     BUBBLE STUDY  07/01/2020   Procedure: BUBBLE STUDY;  Surgeon: Loni Soyla LABOR, MD;  Location: [facility] ENDOSCOPY;  Service: Cardiovascular;;   BUBBLE STUDY  08/05/2020   Procedure: BUBBLE STUDY;  Surgeon: Barbaraann Darryle Ned, MD;  Location: [facility] ENDOSCOPY;  Service: Cardiovascular;;  done with definity     CARDIAC CATHETERIZATION     ESOPHAGOGASTRODUODENOSCOPY     a. 01/2015 EGD: patent esophagus.   ESOPHAGOGASTRODUODENOSCOPY N/A 01/20/2015   Procedure: ESOPHAGOGASTRODUODENOSCOPY (EGD);  Surgeon: Belvie Just, MD;  Location: [facility] ENDOSCOPY;  Service: Endoscopy;  Laterality: N/A;   HAND SURGERY     JOINT REPLACEMENT     reports history bilateral TKA and right TSA   LEFT HEART CATHETERIZATION WITH CORONARY ANGIOGRAM N/A 01/19/2015   RIGHT HEART CATH N/A 12/27/2021   Procedure: RIGHT HEART CATH;  Surgeon: Court Dorn PARAS, MD;  Location: [facility] INVASIVE CV LAB;  Service: Cardiovascular;  Laterality: N/A;   TEE WITHOUT CARDIOVERSION  05/04/2020  TEE WITHOUT CARDIOVERSION N/A 05/05/2020   Procedure: TRANSESOPHAGEAL ECHOCARDIOGRAM (TEE);  Surgeon: Mona Vinie BROCKS, MD;  Location: [facility] ENDOSCOPY;  Service: Cardiovascular;  Laterality: N/A;   TEE WITHOUT CARDIOVERSION N/A 07/01/2020   Procedure: TRANSESOPHAGEAL ECHOCARDIOGRAM (TEE);  Surgeon: Loni Soyla LABOR, MD;  Location: [facility] ENDOSCOPY;  Service: Cardiovascular;  Laterality: N/A;   TEE WITHOUT CARDIOVERSION N/A 08/05/2020   Procedure: TRANSESOPHAGEAL ECHOCARDIOGRAM (TEE);  Surgeon: Barbaraann Darryle Ned, MD;  Location: [facility] ENDOSCOPY;  Service: Cardiovascular;  Laterality: N/A;   THYROIDECTOMY         OBJECTIVE:  Vitals:   08/07/24 1544  BP: 119/60  Pulse: 93  Resp: 20  Temp: 98.2 F (36.8 C)  TempSrc: Oral  SpO2: 94%    Physical Exam Constitutional:      Appearance: Normal appearance.  HENT:     Head: Atraumatic.     Comments: Negative Battle's sign, racoon eyes, or any other sign of trauma.  Cardiovascular:     Rate and Rhythm: Normal rate and regular rhythm.     Pulses: Normal pulses.     Heart sounds: Normal heart sounds.  Pulmonary:     Effort: Pulmonary effort is normal.     Breath sounds: Normal breath sounds.  Abdominal:     General: Bowel sounds are normal.     Palpations: Abdomen is soft.     Tenderness: There is abdominal tenderness (mild periumbilical tenderness without obvious signs of trauma). There is no guarding.     Comments: Negative for Cullen, Gray-Turner, bruising or other signs of obvious trauma  Musculoskeletal:     Right shoulder: Tenderness present. Decreased range of motion.     Left shoulder: Normal.     Right hand: Normal.     Left hand: Swelling and tenderness present. Normal strength. Decreased sensation. Normal capillary refill.     Cervical back: Neck supple. No tenderness or bony tenderness.     Thoracic back: No bony tenderness.     Lumbar back: Tenderness present. No bony tenderness.     Right knee: Normal.     Left knee: Tenderness present.     Instability Tests: Anterior drawer test negative. Posterior drawer test negative.     Right ankle: Normal.     Left ankle: Swelling present. Tenderness present.     Comments: R shoulder: tenderness to the superior and posterior aspects without crepitus or deformity. ROM limited by pain abduction and flexion.    L hand: Swelling over the MCPs of the 2nd-4th digit. Markedly decreased sensation to the 3rd digit. Full digital ROM and digital flexion/extension is 5/5 strength.   Lumbar: Bilateral paraspinal tenderness present. No obvious signs of trauma present.    L knee: tenderness to  posterior and lateral aspects of the knee. ROM limited by pain to flexion and extension.    L ankle: marked swelling with tenderness medial, anterior, and lateral aspects.   Imaging: DG Hand Complete Left Result Date: 08/07/2024 CLINICAL DATA:  Fall and trauma to the left hand. EXAM: LEFT HAND - COMPLETE 3+ VIEW COMPARISON:  None Available. FINDINGS: No acute fracture or dislocation. The bones are osteopenic. Mild soft tissue swelling of the wrist. No radiopaque foreign object or soft tissue gas. IMPRESSION: 1. No acute fracture or dislocation. 2. Mild soft tissue swelling of the wrist. Electronically Signed   By: Vanetta Chou M.D.   On: 08/07/2024 17:32   DG Shoulder Right Result Date: 08/07/2024 CLINICAL DATA:  fall with injuryand right shoulder pain EXAM: RIGHT  SHOULDER - 2+ VIEW COMPARISON:  None Available. FINDINGS: Diffuse osteopenia. Reverse right shoulder arthroplasty is anatomically aligned without dislocation. No periprosthetic lucency to suggest loosening.No acute fracture. There is no evidence of arthropathy or other focal bone abnormality. Soft tissues are unremarkable. IMPRESSION: Well-aligned right shoulder arthroplasty without no acute fracture or dislocation. Electronically Signed   By: Rogelia Myers M.D.   On: 08/07/2024 17:32   DG Knee Complete 4 Views Left Result Date: 08/07/2024 CLINICAL DATA:  Clemens down 4 steps EXAM: LEFT KNEE - COMPLETE 4+ VIEW COMPARISON:  None Available. FINDINGS: Frontal, bilateral oblique, lateral views of the left knee are obtained. Three component left knee arthroplasty is identified without evidence of acute complication. There are no acute displaced fractures. No joint effusion. Soft tissues are unremarkable. IMPRESSION: 1. Unremarkable left knee arthroplasty.  No acute fracture. Electronically Signed   By: Ozell Daring M.D.   On: 08/07/2024 17:29   DG Ankle Complete Left Result Date: 08/07/2024 CLINICAL DATA:  Clemens, injury EXAM: LEFT ANKLE  COMPLETE - 3+ VIEW COMPARISON:  None Available. FINDINGS: Frontal, oblique, lateral views of the left ankle are obtained. No acute fracture, subluxation, or dislocation. Joint spaces are well preserved. Small superior and inferior calcaneal spurs. Diffuse soft tissue edema. IMPRESSION: 1. Diffuse soft tissue swelling.  No acute fracture. Electronically Signed   By: Ozell Daring M.D.   On: 08/07/2024 17:29      Allergies  Allergen Reactions   Ace Inhibitors Other (See Comments)    Unknown reaction   Amlodipine Other (See Comments)    Unknown reaction   Atenolol Other (See Comments)    bradycardia   Avandia [Rosiglitazone] Other (See Comments)    Unknown reaction   Darvon [Propoxyphene] Other (See Comments)    Hallucinations    Erythromycin Itching   Hydralazine  Other (See Comments)    Burning in throat and chest   Hydrocodone Other (See Comments)    Hallucinations.   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 01/2015 Cardiolite : + inf wall ischemia, EF 56%;  b. 01/2015 Cath: LM nl, LAD 85m, LCX min irregs, RCA dominant, 50-92m.   Sleep apnea    Swallowing difficulty    Social History   Socioeconomic History   Marital status: Widowed     Spouse name: Not on file   Number of children: Not on file   Years of education: Not on file   Highest education level: Associate degree: academic program  Occupational History   Occupation: Retired  Tobacco Use   Smoking status: Never   Smokeless tobacco: Never  Vaping Use   Vaping status: Never Used  Substance  and Sexual Activity   Alcohol  use: No   Drug use: Never   Sexual activity: Not Currently  Other Topics Concern   Not on file  Social History Narrative   Lives with daughter   Social Drivers of Health   Financial Resource Strain: Low Risk  (03/04/2024)   Overall Financial Resource Strain (CARDIA)    Difficulty of Paying Living Expenses: Not hard at all  Food Insecurity: No Food Insecurity (03/04/2024)   Hunger Vital Sign    Worried About Running Out of Food in the Last Year: Never true    Ran Out of Food in the Last Year: Never true  Transportation Needs: No Transportation Needs (03/04/2024)   PRAPARE - Administrator, Civil Service (Medical): No    Lack of Transportation (Non-Medical): No  Physical Activity: Inactive (03/04/2024)   Exercise Vital Sign    Days of Exercise per Week: 0 days    Minutes of Exercise per Session: 0 min  Stress: No Stress Concern Present (03/04/2024)   [employer] of Occupational Health - Occupational Stress Questionnaire    Feeling of Stress : Not at all  Social Connections: Moderately Integrated (03/04/2024)   Social Connection and Isolation Panel    Frequency of Communication with Friends and Family: More than three times a week    Frequency of Social Gatherings with Friends and Family: More than three times a week    Attends Religious Services: More than 4 times per year    Active Member of [employer] or Organizations: Yes    Attends Banker Meetings: More than 4 times per year    Marital Status: Widowed   Family History  Problem Relation Age of Onset   Heart disease Mother    Hypertension Mother    Cancer  Father    Alcoholism Father    Past Surgical History:  Procedure Laterality Date   ABDOMINAL HYSTERECTOMY     BUBBLE STUDY  07/01/2020   Procedure: BUBBLE STUDY;  Surgeon: Loni Soyla LABOR, MD;  Location: [facility] ENDOSCOPY;  Service: Cardiovascular;;   BUBBLE STUDY  08/05/2020   Procedure: BUBBLE STUDY;  Surgeon: Barbaraann Darryle Ned, MD;  Location: [facility] ENDOSCOPY;  Service: Cardiovascular;;  done with definity     CARDIAC CATHETERIZATION     ESOPHAGOGASTRODUODENOSCOPY     a. 01/2015 EGD: patent esophagus.   ESOPHAGOGASTRODUODENOSCOPY N/A 01/20/2015   Procedure: ESOPHAGOGASTRODUODENOSCOPY (EGD);  Surgeon: Belvie Just, MD;  Location: [facility] ENDOSCOPY;  Service: Endoscopy;  Laterality: N/A;   HAND SURGERY     JOINT REPLACEMENT     reports history bilateral TKA and right TSA   LEFT HEART CATHETERIZATION WITH CORONARY ANGIOGRAM N/A 01/19/2015   RIGHT HEART CATH N/A 12/27/2021   Procedure: RIGHT HEART CATH;  Surgeon: Court Dorn PARAS, MD;  Location: [facility] INVASIVE CV LAB;  Service: Cardiovascular;  Laterality: N/A;   TEE WITHOUT CARDIOVERSION  05/04/2020   TEE WITHOUT CARDIOVERSION N/A 05/05/2020   Procedure: TRANSESOPHAGEAL ECHOCARDIOGRAM (TEE);  Surgeon: Mona Vinie BROCKS, MD;  Location: [facility] ENDOSCOPY;  Service: Cardiovascular;  Laterality: N/A;   TEE WITHOUT CARDIOVERSION N/A 07/01/2020   Procedure: TRANSESOPHAGEAL ECHOCARDIOGRAM (TEE);  Surgeon: Loni Soyla LABOR, MD;  Location: [facility] ENDOSCOPY;  Service: Cardiovascular;  Laterality: N/A;   TEE WITHOUT CARDIOVERSION N/A 08/05/2020   Procedure: TRANSESOPHAGEAL ECHOCARDIOGRAM (TEE);  Surgeon: Barbaraann Darryle Ned, MD;  Location: [facility] ENDOSCOPY;  Service: Cardiovascular;  Laterality: N/A;   CLEMENTINA Rolinda Rogue, MD 08/08/24 [phone]

## 2024-08-09 ENCOUNTER — Other Ambulatory Visit: Payer: Self-pay

## 2024-08-09 ENCOUNTER — Telehealth: Payer: Self-pay | Admitting: Dietician

## 2024-08-09 NOTE — Telephone Encounter (Signed)
 Patient called for an appointment for CGM training.  This has been set up for Monday.  Leita Constable, RD, LDN, CDCES, DipACLM

## 2024-08-09 NOTE — Progress Notes (Signed)
 Specialty Pharmacy Refill Coordination Note  Regina [name] is a 83 y.o. female contacted today regarding refills of specialty medication(s) Tafamidis  Meglumine  (Cardiac) (Vyndaqel )   Patient requested Delivery   Delivery date: 08/15/24   Verified address: [address] [place] [address]   Medication will be filled on 08/14/24.

## 2024-08-12 ENCOUNTER — Encounter: Attending: Family Medicine | Admitting: Dietician

## 2024-08-12 DIAGNOSIS — N184 Chronic kidney disease, stage 4 (severe): Secondary | ICD-10-CM | POA: Diagnosis not present

## 2024-08-12 DIAGNOSIS — E113499 Type 2 diabetes mellitus with severe nonproliferative diabetic retinopathy without macular edema, unspecified eye: Secondary | ICD-10-CM | POA: Insufficient documentation

## 2024-08-14 ENCOUNTER — Other Ambulatory Visit: Payer: Self-pay

## 2024-08-19 ENCOUNTER — Other Ambulatory Visit (HOSPITAL_COMMUNITY): Payer: Self-pay | Admitting: Internal Medicine

## 2024-08-19 DIAGNOSIS — I5022 Chronic systolic (congestive) heart failure: Secondary | ICD-10-CM

## 2024-08-21 ENCOUNTER — Telehealth: Payer: Self-pay | Admitting: Cardiovascular Disease

## 2024-08-21 NOTE — Telephone Encounter (Signed)
 Pharmacy called with a cover my med code [id number]. She stated she faxed in information but did not state what pharmacy she was from. Please Advise

## 2024-08-21 NOTE — Telephone Encounter (Addendum)
   The medication attached to CMM KEY is Wainua  45 mg/.8 mL. Is our clinic managing this drug for patient. Please advise is patient using this as well as Mounjaro .

## 2024-08-22 ENCOUNTER — Ambulatory Visit: Attending: Cardiovascular Disease

## 2024-08-22 ENCOUNTER — Encounter: Admitting: Neurology

## 2024-08-22 ENCOUNTER — Telehealth: Payer: Self-pay | Admitting: Cardiovascular Disease

## 2024-08-22 DIAGNOSIS — Z7901 Long term (current) use of anticoagulants: Secondary | ICD-10-CM

## 2024-08-22 DIAGNOSIS — I4819 Other persistent atrial fibrillation: Secondary | ICD-10-CM | POA: Diagnosis not present

## 2024-08-22 DIAGNOSIS — D6869 Other thrombophilia: Secondary | ICD-10-CM

## 2024-08-22 LAB — POCT INR: INR: 3.1 — AB (ref 2.0–3.0)

## 2024-08-22 NOTE — Telephone Encounter (Signed)
 Pt walked in to office explaining insurance conflict. Pt states Dr. Barbaraann referred her to Dr. Cherrie, but he will now be out of network with her insurance; also states that she sees Dr. Rolan & would like to continue services if he is in network. If not, pt would like to know who else Dr. Barbaraann would refer her to see.  Pt states changes will take place by the end of November 2025 - insurance is [employer].   JB, 08-22-24

## 2024-08-22 NOTE — Progress Notes (Signed)
 INR 3.1 Please see anticoagulation encounter (USE GREEN TABLET ONLY)  Decrease to 1 tablet (2.5mg ) daily except for 1/2 tablet (1.25mg ) on Mondays, Wednesdays and Fridays.  Recheck INR in 4 weeks. Anticoagulation Clinic [phone]

## 2024-08-22 NOTE — Patient Instructions (Signed)
(  USE GREEN TABLET ONLY)  Decrease to 1 tablet (2.5mg ) daily except for 1/2 tablet (1.25mg ) on Mondays, Wednesdays and Fridays.  Recheck INR in 4 weeks. Anticoagulation Clinic [phone]

## 2024-08-23 ENCOUNTER — Other Ambulatory Visit: Payer: Self-pay

## 2024-08-23 ENCOUNTER — Other Ambulatory Visit (HOSPITAL_COMMUNITY): Payer: Self-pay

## 2024-08-23 ENCOUNTER — Telehealth (HOSPITAL_COMMUNITY): Payer: Self-pay | Admitting: Pharmacist

## 2024-08-23 MED ORDER — VYNDAMAX 61 MG PO CAPS
61.0000 mg | ORAL_CAPSULE | Freq: Every day | ORAL | 11 refills | Status: AC
  Filled 2024-08-23 – 2024-09-05 (×2): qty 30, 30d supply, fill #0
  Filled 2024-10-16 – 2024-10-23 (×3): qty 30, 30d supply, fill #1

## 2024-08-23 NOTE — Telephone Encounter (Signed)
 Prescription sent for Vyndamax  given that Vyndaquel production is ceasing.

## 2024-08-23 NOTE — Telephone Encounter (Signed)
 Pharmacy Patient Advocate Encounter   Received notification from Physician's Office that prior authorization for WAINUA  45MG /.8ML is required/requested.   Insurance verification completed.   The patient is insured through [facility].   Per test claim: PA required; PA submitted to above mentioned insurance via Latent Key/confirmation #/EOC [id number] Status is pending

## 2024-08-23 NOTE — Telephone Encounter (Signed)
 Pharmacy Patient Advocate Encounter   Received notification from Physician's Office that prior authorization for WAINUA  45 MG/.8ML is required/requested.   Insurance verification completed.   The patient is insured through [facility].   Per test claim: PA required; PA started via CoverMyMeds. KEY [id number] . Please see clinical question(s) below that I am not finding the answer to in their chart and advise.   Please advise as to what medications has the patient had a trial and failure to or that would not be appropriate due to a specific medical reason for this indication.

## 2024-08-23 NOTE — Telephone Encounter (Addendum)
 There was confusion as to which doctor is managing the Wainua  because the KEY came over with the ADV HF doctor as the prescriber, not our clinic's provider. If we are managing this med for patient in primary cardio, we can proceed with PA. Thanks for clarifying.

## 2024-08-23 NOTE — Progress Notes (Signed)
 Specialty Pharmacy Ongoing Clinical Assessment Note  Regina [name] is a 83 y.o. female who is being followed by the specialty pharmacy service for RxSp Cardiology   Patient's specialty medication(s) reviewed today: Tafamidis  Meglumine  (Cardiac) (Vyndaqel )   Missed doses in the last 4 weeks: 0   Patient/Caregiver did not have any additional questions or concerns.   Therapeutic benefit summary: Patient is achieving benefit   Adverse events/side effects summary: No adverse events/side effects   Patient's therapy is appropriate to: Continue    Goals Addressed             This Visit's Progress    Stabilization of disease   On track    Patient is on track. Patient will maintain adherence         Follow up: 12 months  [facility]

## 2024-08-27 NOTE — Progress Notes (Signed)
 Start 0945  End 1000   Patient is here today with her son for [facility] training.  Training complete and patient is waiting for her sensor to warm up.    Patient to call for questions.  Leita Constable, RD, LDN, CDCES, DipACLM

## 2024-08-28 NOTE — Telephone Encounter (Signed)
 Jasmine with [facility] calling to f/u on authorization for Wainua . Wanted to make sure office was aware. She can be reached at the number below with any questions or concerns.   [phone] ext. [phone]

## 2024-08-30 ENCOUNTER — Ambulatory Visit: Payer: Self-pay | Admitting: Neurology

## 2024-08-30 ENCOUNTER — Ambulatory Visit: Admitting: Neurology

## 2024-08-30 DIAGNOSIS — G5603 Carpal tunnel syndrome, bilateral upper limbs: Secondary | ICD-10-CM

## 2024-08-30 DIAGNOSIS — R29898 Other symptoms and signs involving the musculoskeletal system: Secondary | ICD-10-CM

## 2024-08-30 NOTE — Progress Notes (Signed)
 Advised

## 2024-08-30 NOTE — Procedures (Signed)
 [facility] Neurology  [address] [place], [address]  [place], [place] [address] Tel: [phone] Fax: [phone] Test Date:  08/30/2024  Patient: Regina [name] DOB: Jun 17, 1941 Physician: Tonita Blanch, DO  Sex: Female Height: 5' 2 Ref Phys: Juliene Dunnings, DO  ID#: [id number]   Technician:    History: This is a 83 year old female referred for evaluation of bilateral hand paresthesias, pain, and weakness.  NCV & EMG Findings: Extensive electrodiagnostic testing of the right upper extremity and additional studies of the left shows:  Bilateral median sensory responses are absent.  Bilateral ulnar sensory responses show prolonged latency (R3.5, L3.3 ms).  Bilateral radial sensory responses are within normal limits. Bilateral median motor responses are absent.  Bilateral ulnar motor responses show prolonged latency (R3.4, L3.4 ms), reduced amplitude (R5.7, L3.6 mV), and decreased conduction velocity (A Elbow-B Elbow, R42, L38 m/s).   Chronic motor axon loss changes are seen affecting bilateral ulnar innervated muscles, without accompanying active denervation.  Despite maximal activation, no motor unit recruitment is seen in bilateral abductor pollicis brevis muscles.   Impression: Bilateral median neuropathy at or distal to the wrist, consistent with a clinical diagnosis of carpal tunnel syndrome.  Overall, these findings are very severe in degree electrically. Bilateral ulnar neuropathy with slowing across the elbow, with demyelinating and axonal features, moderate.   ___________________________ Tonita Blanch, DO    Nerve Conduction Studies   Stim Site NR Peak (ms) Norm Peak (ms) O-P Amp (V) Norm O-P Amp  Left Median Anti Sensory (2nd Digit)  32 C  Wrist *NR  <3.8  >10  Right Median Anti Sensory (2nd Digit)  32 C  Wrist *NR  <3.8  >10  Left Radial Anti Sensory (Base 1st Digit)  32 C  Wrist    2.5 <2.8 12.8 >10  Right Radial Anti Sensory (Base 1st Digit)  32 C  Wrist    2.4  <2.8 13.8 >10  Left Ulnar Anti Sensory (5th Digit)  32 C  Wrist    *3.3 <3.2 7.5 >5  Right Ulnar Anti Sensory (5th Digit)  32 C  Wrist    *3.5 <3.2 7.0 >5     Stim Site NR Onset (ms) Norm Onset (ms) O-P Amp (mV) Norm O-P Amp Site1 Site2 Delta-0 (ms) Dist (cm) Vel (m/s) Norm Vel (m/s)  Left Median Motor (Abd Poll Brev)  32 C  Wrist *NR  <4.0  >5 Elbow Wrist  0.0  >50  Elbow *NR            Right Median Motor (Abd Poll Brev)  32 C  Wrist *NR  <4.0  >5 Elbow Wrist  0.0  >50  Elbow *NR            Left Ulnar Motor (Abd Dig Minimi)  32 C  Wrist    *3.4 <3.1 *3.6 >7 B Elbow Wrist 4.2 22.0 52 >50  B Elbow    7.6  3.3  A Elbow B Elbow 2.6 10.0 *38 >50  A Elbow    10.2  3.0         Right Ulnar Motor (Abd Dig Minimi)  32 C  Wrist    *3.4 <3.1 *5.7 >7 B Elbow Wrist 4.4 22.0 50 >50  B Elbow    7.8  5.5  A Elbow B Elbow 2.4 10.0 *42 >50  A Elbow    10.2  2.8          Electromyography   Side Muscle Ins.Act Fibs Fasc Recrt  Amp Dur Poly Activation Comment  Right 1stDorInt Nml Nml Nml *2- *1+ *1+ *1+ Nml N/A  Right Abd Poll Brev Nml Nml Nml *None *- *- *- Nml *ATR  Right PronatorTeres Nml Nml Nml Nml Nml Nml Nml Nml N/A  Right Biceps Nml Nml Nml Nml Nml Nml Nml Nml N/A  Right Triceps Nml Nml Nml Nml Nml Nml Nml Nml N/A  Right Deltoid Nml Nml Nml Nml Nml Nml Nml Nml N/A  Right Abd Dig Min Nml Nml Nml *2- *1+ *1+ *1+ Nml N/A  Right FlexCarpiUln Nml Nml Nml *1- *1+ *1+ *1+ Nml N/A  Left 1stDorInt Nml Nml Nml *2- *1+ *1+ *1+ Nml N/A  Left Abd Poll Brev Nml Nml Nml *None *- *- *- Nml *ATR  Left PronatorTeres Nml Nml Nml Nml Nml Nml Nml Nml N/A  Left Biceps Nml Nml Nml Nml Nml Nml Nml Nml N/A  Left Triceps Nml Nml Nml Nml Nml Nml Nml Nml N/A  Left Deltoid Nml Nml Nml Nml Nml Nml Nml Nml N/A  Left Abd Dig Min Nml Nml Nml *SMU *1+ *1+ *1+ Nml N/A  Left FlexCarpiUln Nml Nml Nml *2- *1+ *1+ *1+ Nml N/A      Waveforms:

## 2024-09-02 ENCOUNTER — Other Ambulatory Visit (HOSPITAL_COMMUNITY): Payer: Self-pay

## 2024-09-02 NOTE — Telephone Encounter (Signed)
 Called [facility] to inform them of prescription status of refill too soon and plan limit to 30 day supply. Contact left in case further issues arise.

## 2024-09-03 ENCOUNTER — Other Ambulatory Visit (HOSPITAL_COMMUNITY): Payer: Self-pay | Admitting: Family Medicine

## 2024-09-04 ENCOUNTER — Telehealth: Payer: Self-pay | Admitting: Cardiovascular Disease

## 2024-09-04 ENCOUNTER — Other Ambulatory Visit: Payer: Self-pay

## 2024-09-04 NOTE — Telephone Encounter (Signed)
 Pt c/o medication issue:  1. Name of Medication:   Eplontersen  Sodium (WAINUA ) 45 MG/0.8ML SOAJ    2. How are you currently taking this medication (dosage and times per day)?   Inject 45 mg into the skin every 30 (thirty) days.    3. Are you having a reaction (difficulty breathing--STAT)? No   4. What is your medication issue? Pt has been receiving financial assistance for medication. She has received a letter in the mail stating they need info for preauth from her provider to continue paying for Rx. Paperwork needs to be done by 12/31. Please advise.   [employer] Advantage [employer]: [phone]

## 2024-09-05 ENCOUNTER — Other Ambulatory Visit: Payer: Self-pay

## 2024-09-05 ENCOUNTER — Telehealth: Payer: Self-pay | Admitting: Cardiovascular Disease

## 2024-09-05 ENCOUNTER — Encounter: Admitting: Neurology

## 2024-09-05 ENCOUNTER — Other Ambulatory Visit (HOSPITAL_COMMUNITY): Payer: Self-pay

## 2024-09-05 ENCOUNTER — Encounter: Payer: Self-pay | Admitting: Pharmacy Technician

## 2024-09-05 NOTE — Telephone Encounter (Signed)
 Claim was paid on 08/22/24. Refill is too soon. Patient should have access to the drug at this time. Attempted renewal recently and plan rejected saying drug is covered. We are unable to submit PA until closer to expiration date.

## 2024-09-05 NOTE — Telephone Encounter (Signed)
 Pt c/o Syncope: STAT if syncope occurred within 24 hours and pt complains of lightheadedness  Did you pass out today? no   When is the last time you passed out? no   Has this occurred multiple times? Several times through out the day   Did you have any symptoms prior to passing out? Patient has been feeling very faint and feeling like she is going pass out.   5. Did you fall? If so, are you on a blood thinner?no

## 2024-09-05 NOTE — Telephone Encounter (Signed)
 Per pt she has received email from insurance saying they have approved her medication and to let us  know.

## 2024-09-05 NOTE — Telephone Encounter (Signed)
 Spoke with the patient who states that she has been having episodes of feeling faint. She has not passed out but she gets lightheaded and has to rest. The episodes do not occur with position changes. She has not checked her heart rate and blood pressure during these episodes. She does report that her BP runs on the lower side, usually 101/50. Her heart rate is usually 80-90. She reports taking medications as prescribed. She believes that she is drinking plenty of fluids and eating regular meals. She reports that her blood sugar is well controlled as well. She also complains of headaches. Advised patient to keep up with BP and HR and call back with readings. She is also reaching out to her PCP for recommendations. She will call us  back.

## 2024-09-05 NOTE — Telephone Encounter (Signed)
Already being worked on

## 2024-09-10 ENCOUNTER — Other Ambulatory Visit: Payer: Self-pay

## 2024-09-10 ENCOUNTER — Telehealth: Payer: Self-pay

## 2024-09-10 NOTE — Telephone Encounter (Signed)
 Patient called questioning if she should be on mounjaro  7.5 or 10mg .  Office note says 7.5 but order placed was for 10.  Please clarify.

## 2024-09-10 NOTE — Telephone Encounter (Signed)
 Informed patient that she should be taking mounjaro  10mg .

## 2024-09-13 ENCOUNTER — Other Ambulatory Visit: Payer: Self-pay

## 2024-09-13 NOTE — Progress Notes (Signed)
 Specialty Pharmacy Refill Coordination Note  GEANA WALTS is a 83 y.o. female contacted today regarding refills of specialty medication(s) Tafamidis  (Vyndamax )   Patient requested Delivery   Delivery date: 09/23/24   Verified address: [address] [place] [address]   Medication will be filled on: 09/20/24

## 2024-09-16 ENCOUNTER — Other Ambulatory Visit (HOSPITAL_COMMUNITY): Payer: Self-pay | Admitting: Internal Medicine

## 2024-09-16 ENCOUNTER — Other Ambulatory Visit: Payer: Self-pay

## 2024-09-16 MED ORDER — INSULIN LISPRO (1 UNIT DIAL) 100 UNIT/ML (KWIKPEN): 4.0000 [IU] | PEN_INJECTOR | Freq: Every day | SUBCUTANEOUS | 3 refills | Status: AC | PRN

## 2024-09-19 ENCOUNTER — Ambulatory Visit: Attending: Cardiovascular Disease | Admitting: *Deleted

## 2024-09-19 DIAGNOSIS — I4819 Other persistent atrial fibrillation: Secondary | ICD-10-CM

## 2024-09-19 DIAGNOSIS — Z5181 Encounter for therapeutic drug level monitoring: Secondary | ICD-10-CM

## 2024-09-19 DIAGNOSIS — Z7901 Long term (current) use of anticoagulants: Secondary | ICD-10-CM | POA: Diagnosis not present

## 2024-09-19 LAB — POCT INR: POC INR: 2.1

## 2024-09-19 NOTE — Progress Notes (Signed)
 Lab Results  Component Value Date   INR 2.1 09/19/2024   INR 3.1 (A) 08/22/2024   INR 3.2 (A) 08/01/2024    Description   (USE GREEN TABLET ONLY)  INR 2.1; Continue 1 tablet (2.5mg ) daily except for 1/2 tablet (1.25mg ) on Mondays, Wednesdays and Fridays.  Recheck INR in 5 weeks. Anticoagulation Clinic [phone]

## 2024-09-19 NOTE — Patient Instructions (Signed)
 Description   (USE GREEN TABLET ONLY)  INR 2.1; Continue 1 tablet (2.5mg ) daily except for 1/2 tablet (1.25mg ) on Mondays, Wednesdays and Fridays.  Recheck INR in 5 weeks. Anticoagulation Clinic [phone]

## 2024-09-20 ENCOUNTER — Other Ambulatory Visit: Payer: Self-pay

## 2024-09-20 ENCOUNTER — Other Ambulatory Visit: Payer: Self-pay | Admitting: Family Medicine

## 2024-09-20 NOTE — Telephone Encounter (Unsigned)
 Copied from CRM [id number]. Topic: Clinical - Medication Refill >> Sep 20, 2024  2:43 PM Santiya F wrote: Medication: sacubitril -valsartan  (ENTRESTO ) 24-26 MG [[id number]]  Has the patient contacted their pharmacy? Yes  (Agent: If yes, when and what did the pharmacy advise?) Contact office. Pharmacy has sent requests and has not heard back from the office.   This is the patient's preferred pharmacy:  [facility] - [place], [place] - [address] [facility] [address] [place] [address] Phone: [phone] Fax: [phone]  Is this the correct pharmacy for this prescription? Yes If no, delete pharmacy and type the correct one.   Has the prescription been filled recently? Yes  Is the patient out of the medication? Yes  Has the patient been seen for an appointment in the last year OR does the patient have an upcoming appointment? Yes  Can we respond through MyChart? Yes  Agent: Please be advised that Rx refills may take up to 3 business days. We ask that you follow-up with your pharmacy.

## 2024-09-23 MED ORDER — SACUBITRIL-VALSARTAN 24-26 MG PO TABS: 1.0000 | ORAL_TABLET | Freq: Two times a day (BID) | ORAL | 0 refills | Status: DC

## 2024-09-23 NOTE — Telephone Encounter (Signed)
 I am sending a month supply. She needs to call her cardiologist's office. Thanks, BJ

## 2024-09-24 ENCOUNTER — Telehealth: Payer: Self-pay

## 2024-09-24 ENCOUNTER — Other Ambulatory Visit (HOSPITAL_COMMUNITY): Payer: Self-pay

## 2024-09-24 NOTE — Telephone Encounter (Signed)
 Pharmacy Patient Advocate Encounter   Received notification from [employer] that prior authorization for Entresto  24-26 is required/requested.   Insurance verification completed.   The patient is insured through [facility].   Per test claim: Per test claim, medication is not covered due to plan/benefit exclusion, PA not submitted at this time

## 2024-09-26 ENCOUNTER — Encounter: Attending: Family Medicine | Admitting: Dietician

## 2024-09-26 ENCOUNTER — Other Ambulatory Visit: Payer: Self-pay | Admitting: Internal Medicine

## 2024-09-26 DIAGNOSIS — Z7984 Long term (current) use of oral hypoglycemic drugs: Secondary | ICD-10-CM | POA: Insufficient documentation

## 2024-09-26 DIAGNOSIS — Z794 Long term (current) use of insulin: Secondary | ICD-10-CM | POA: Insufficient documentation

## 2024-09-26 DIAGNOSIS — E113499 Type 2 diabetes mellitus with severe nonproliferative diabetic retinopathy without macular edema, unspecified eye: Secondary | ICD-10-CM

## 2024-09-26 DIAGNOSIS — E1140 Type 2 diabetes mellitus with diabetic neuropathy, unspecified: Secondary | ICD-10-CM

## 2024-09-26 DIAGNOSIS — E119 Type 2 diabetes mellitus without complications: Secondary | ICD-10-CM | POA: Diagnosis present

## 2024-09-26 DIAGNOSIS — Z7985 Long-term (current) use of injectable non-insulin antidiabetic drugs: Secondary | ICD-10-CM | POA: Insufficient documentation

## 2024-09-26 DIAGNOSIS — Z713 Dietary counseling and surveillance: Secondary | ICD-10-CM | POA: Insufficient documentation

## 2024-09-26 NOTE — Progress Notes (Signed)
 Diabetes Self-Management Education  Visit Type: First/Initial  Appt. Start Time: 1505 Appt. End Time: 1605  09/26/2024  [name], identified by name and date of birth, is a 83 y.o. female with a diagnosis of Diabetes: Type 2 (years).   ASSESSMENT Patient is here today alone.  She was last seen by myself in 2022 except for brief eduction on her Herlene in 2025. Due to current [place] recall, I checked her last 3 sensor numbers and these were not impacted.   She states that she feels very restricted regarding her diet due to diabetes and need for coumadin .  Increased details given including meal ideas and foods allowed that are low in vitamin K and to give more variety with diabetes. Discussed adequate nutrition given mounjaro  and to be sure she eats at least 2 meals per day.  Discussed adequate but not excessive protein. Discussed low sodium.  History includes Type 2 Diabetes, CKD stage 4, HTN, CVA, chronic anticoagulation, afib, neuropathy, retinopathy, OSA on C-pap, cardiac amloydosis, CHF A1C 6.2% on 06/18/2024 eGFR 20, BUN 47, Creatinine 2.27, Potassium 4.1, eGFR 21 on 07/04/2024, vitamin B-12 - 769 on 07/11/2024 Medications include Farxiga , Mounjaro , Humalog  (sliding scale for correction), coumadin , lasix   CGM:  Libre 3+ Current sensor reading 120 CGM Results from download: 09/26/2024  % Time CGM active:    %   (Goal >70%)  Average glucose:   120 mg/dL for 14 days  Glucose management indicator:   6.2 %  Time in range (70-180 mg/dL):   96 %   (Goal >29%)  Time High (181-250 mg/dL):   3 %   (Goal < 74%)  Time Very High (>250 mg/dL):    0 %   (Goal < 5%)  Time Low (54-69 mg/dL):   1 %   (Goal <5%)  Time Very Low (<54 mg/dL):   0 %   (Goal <8%)  %CV (glucose variability)     %  (Goal <36%)   219 lbs 09/26/2024 at home - ? fluid retention UBW 220 lbs Patient lives with her son.  They share shopping and patient does most of the cooking. 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 They cook with little sodium. She walks in the house frequently.      Diabetes Self-Management Education - 09/26/24 1541       Visit Information   Visit Type First/Initial      Initial Visit   Diabetes Type Type 2   years   Are you currently following a meal plan? Yes    What type of meal plan do you follow? diabetes, heart    Are you taking your medications as prescribed? Yes      Health Coping   How would you rate your overall health? Fair      Psychosocial Assessment   Patient Belief/Attitude about Diabetes Motivated to manage diabetes    What is the hardest part about your diabetes right now, causing you the most concern, or is the most worrisome to you about your diabetes?   Making healty food and beverage choices    Self-care barriers None    Self-management support Doctor's office;CDE visits    Other persons present Patient    Patient Concerns Nutrition/Meal planning;Glycemic Control;Problem Solving;Support    Special Needs None    Preferred Learning Style No preference indicated    Learning Readiness Ready    How often do you need to have someone  help you when you read instructions, pamphlets, or other written materials from your doctor or pharmacy? 1 - Never    What is the last grade level you completed in school? 2 years college      Pre-Education Assessment   Patient understands the diabetes disease and treatment process. Demonstrates understanding / competency    Patient understands incorporating nutritional management into lifestyle. Needs Review    Patient undertands incorporating physical activity into lifestyle. Needs Review    Patient understands using medications safely. Needs Review    Patient understands monitoring blood glucose, interpreting and using results Needs Review    Patient understands prevention, detection, and treatment of acute complications. Needs Review    Patient understands prevention, detection, and  treatment of chronic complications. Needs Review    Patient understands how to develop strategies to address psychosocial issues. Needs Review    Patient understands how to develop strategies to promote health/change behavior. Needs Review      Complications   Last HgB A1C per patient/outside source 6.2 %   07/11/2024   How often do you check your blood sugar? > 4 times/day    Fasting Blood glucose range (mg/dL) 29-870    Postprandial Blood glucose range (mg/dL) 869-820    Number of hypoglycemic episodes per month 1    Have you had a dilated eye exam in the past 12 months? Yes    Have you had a dental exam in the past 12 months? Yes    Are you checking your feet? Yes    How many days per week are you checking your feet? 7      Dietary Intake   Breakfast boiled eggs, unsalted grits with butter OR oatmeal, fruit    Snack (morning) none    Lunch fruit    Snack (afternoon) celery and PB    Dinner shrimp, boiled eggs    Snack (evening) none    Beverage(s) water, sugar free soda      Activity / Exercise   Activity / Exercise Type Light (walking / raking leaves)    How many days per week do you exercise? 7    How many minutes per day do you exercise? 15    Total minutes per week of exercise 105      Patient Education   Previous Diabetes Education Yes   2022   Healthy Eating Plate Method;Meal options for control of blood glucose level and chronic complications.;Role of diet in the treatment of diabetes and the relationship between the three main macronutrients and blood glucose level    Being Active Role of exercise on diabetes management, blood pressure control and cardiac health.;Helped patient identify appropriate exercises in relation to his/her diabetes, diabetes complications and other health issue.    Medications Reviewed patients medication for diabetes, action, purpose, timing of dose and side effects.    Monitoring Taught/evaluated CGM (comment)    Diabetes Stress and Support  Identified and addressed patients feelings and concerns about diabetes;Worked with patient to identify barriers to care and solutions      Individualized Goals (developed by patient)   Nutrition General guidelines for healthy choices and portions discussed    Physical Activity Exercise 5-7 days per week;15 minutes per day    Medications take my medication as prescribed    Monitoring  Consistenly use CGM    Problem Solving Eating Pattern;Addressing barriers to behavior change      Post-Education Assessment   Patient understands the diabetes disease and treatment  process. Demonstrates understanding / competency    Patient understands incorporating nutritional management into lifestyle. Needs Review    Patient undertands incorporating physical activity into lifestyle. Demonstrates understanding / competency    Patient understands using medications safely. Demonstrates understanding / competency    Patient understands monitoring blood glucose, interpreting and using results Demonstrates understanding / competency    Patient understands prevention, detection, and treatment of acute complications. Demonstrates understanding / competency    Patient understands prevention, detection, and treatment of chronic complications. Demonstrates understanding / competency    Patient understands how to develop strategies to address psychosocial issues. Demonstrates understanding / competency    Patient understands how to develop strategies to promote health/change behavior. Needs Review      Outcomes   Expected Outcomes Demonstrated interest in learning. Expect positive outcomes    Future DMSE 2 months    Program Status Not Completed          Individualized Plan for Diabetes Self-Management Training:   Learning Objective:  Patient will have a greater understanding of diabetes self-management. Patient education plan is to attend individual and/or group sessions per assessed needs and concerns.   Plan:    Patient Instructions  Consider compression socks (Amazon or other). Consider armchair exercises 3 times per week.  Continue to walk as MD allows.  Avoid foods with added Phos...  Vegetable Ideas:  Eggplant - peel and slice in 1/2 rounds, dip in beaten eggs and bread crumbs, bake 350 for 15 minutes and turn then bake until browned.  2.  Lentil soup - saute onions, celery, and carrots then add, lentils, water (at least 3 cups water per 1 cup dried lentils).  Boil ~45 minutes until done.  Season with cumin, garlic powder, onion powder, lemon juice or low sodium canned tomatoes and simmer for about 15 more minutes.  3.  Stir fry and brown rice with chicken  Breakfast ideas:  Choose Greek yogurt if you want to try.  Serve with nuts and fresh fruit for a meal. 2.  Oatmeal with fruit and nuts 3.  Low sodium Cheese toast, fresh fruit 4.  Peanut butter on [employer] toast, fresh fruit  Expected Outcomes:  Demonstrated interest in learning. Expect positive outcomes  Education material provided: Meal plan card and Diabetes Resources, Vitamin K from AND  If problems or questions, patient to contact team via:  Phone  Future DSME appointment: 2 months

## 2024-09-26 NOTE — Patient Instructions (Addendum)
 Consider compression socks (Amazon or other). Consider armchair exercises 3 times per week.  Continue to walk as MD allows.  Avoid foods with added Phos...  Vegetable Ideas:  Eggplant - peel and slice in 1/2 rounds, dip in beaten eggs and bread crumbs, bake 350 for 15 minutes and turn then bake until browned.  2.  Lentil soup - saute onions, celery, and carrots then add, lentils, water (at least 3 cups water per 1 cup dried lentils).  Boil ~45 minutes until done.  Season with cumin, garlic powder, onion powder, lemon juice or low sodium canned tomatoes and simmer for about 15 more minutes.  3.  Stir fry and brown rice with chicken  Breakfast ideas:  Choose Greek yogurt if you want to try.  Serve with nuts and fresh fruit for a meal. 2.  Oatmeal with fruit and nuts 3.  Low sodium Cheese toast, fresh fruit 4.  Peanut butter on [employer] toast, fresh fruit

## 2024-10-07 ENCOUNTER — Telehealth: Payer: Self-pay | Admitting: *Deleted

## 2024-10-07 NOTE — Telephone Encounter (Signed)
 If the cough is mild and no other symptom associated, she does not need to take medication for it.  Due to her chronic comorbidities, will need to be careful with adding medications. If cough becomes productive and is still no associated symptoms, she can try plain Mucinex. If cough is persisting or gets worse, she needs to be seen here in the office. Thanks, BJ

## 2024-10-07 NOTE — Telephone Encounter (Signed)
 Pharmacy Patient Advocate Encounter  Received notification from [employer] that Prior Authorization for FreeStyle Libre 3 Plus Sensor  has been APPROVED from 09/05/2024 to 10/16/2025   PA #/Case ID/Reference #: [id number]

## 2024-10-07 NOTE — Telephone Encounter (Signed)
 Patient informed of Dr. Gib message. Patent Voiced understanding no further questions.

## 2024-10-07 NOTE — Telephone Encounter (Signed)
 Copied from CRM #[id number]. Topic: Clinical - Medication Question >> Oct 07, 2024 10:00 AM China J wrote: Reason for CRM: Patient is calling because she has a dry cough and would like if she could have medication to help with this. She does not have any other symptoms.  Please call patient for an update at [phone].

## 2024-10-09 ENCOUNTER — Other Ambulatory Visit: Payer: Self-pay

## 2024-10-14 ENCOUNTER — Other Ambulatory Visit: Payer: Self-pay | Admitting: Family Medicine

## 2024-10-14 ENCOUNTER — Other Ambulatory Visit (HOSPITAL_COMMUNITY): Payer: Self-pay

## 2024-10-16 ENCOUNTER — Other Ambulatory Visit: Payer: Self-pay

## 2024-10-22 ENCOUNTER — Other Ambulatory Visit (HOSPITAL_COMMUNITY): Payer: Self-pay

## 2024-10-22 ENCOUNTER — Other Ambulatory Visit: Payer: Self-pay

## 2024-10-22 NOTE — Progress Notes (Signed)
 [name]

## 2024-10-23 ENCOUNTER — Other Ambulatory Visit: Payer: Self-pay

## 2024-10-23 ENCOUNTER — Other Ambulatory Visit (HOSPITAL_COMMUNITY): Payer: Self-pay

## 2024-10-24 ENCOUNTER — Ambulatory Visit

## 2024-10-25 ENCOUNTER — Encounter: Admitting: Neurology

## 2024-10-25 ENCOUNTER — Other Ambulatory Visit: Payer: Self-pay | Admitting: Family Medicine

## 2024-10-25 DIAGNOSIS — K219 Gastro-esophageal reflux disease without esophagitis: Secondary | ICD-10-CM

## 2024-10-30 ENCOUNTER — Other Ambulatory Visit: Payer: Self-pay

## 2024-11-04 ENCOUNTER — Ambulatory Visit: Attending: Cardiovascular Disease | Admitting: Pharmacist

## 2024-11-04 DIAGNOSIS — I4819 Other persistent atrial fibrillation: Secondary | ICD-10-CM

## 2024-11-04 DIAGNOSIS — Z7901 Long term (current) use of anticoagulants: Secondary | ICD-10-CM | POA: Diagnosis not present

## 2024-11-04 DIAGNOSIS — D6869 Other thrombophilia: Secondary | ICD-10-CM

## 2024-11-04 DIAGNOSIS — I4891 Unspecified atrial fibrillation: Secondary | ICD-10-CM | POA: Diagnosis not present

## 2024-11-04 LAB — POCT INR: INR: 1.5 — AB (ref 2.0–3.0)

## 2024-11-04 NOTE — Progress Notes (Signed)
 Description   (USE GREEN TABLET ONLY)  INR 1.5; Take a whole tablet today and then continue 1 tablet (2.5mg ) daily except for 1/2 tablet (1.25mg ) on Mondays, Wednesdays and Fridays.  Recheck INR in 2 weeks. Anticoagulation Clinic [phone]

## 2024-11-04 NOTE — Patient Instructions (Signed)
 Description   (USE GREEN TABLET ONLY)  INR 1.5; Take a whole tablet today and then continue 1 tablet (2.5mg ) daily except for 1/2 tablet (1.25mg ) on Mondays, Wednesdays and Fridays.  Recheck INR in 2 weeks. Anticoagulation Clinic [phone]

## 2024-11-05 ENCOUNTER — Other Ambulatory Visit: Payer: Self-pay

## 2024-11-05 NOTE — Progress Notes (Signed)
 Specialty Pharmacy Refill Coordination Note  Regina [name] is a 84 y.o. female contacted today regarding refills of specialty medication(s) Tafamidis  (Vyndamax )   Patient requested Delivery   Delivery date: 11/06/24   Verified address: [address] [place] [address]   Medication will be filled on: 11/05/24

## 2024-11-18 ENCOUNTER — Ambulatory Visit: Admitting: *Deleted

## 2024-11-18 DIAGNOSIS — I4891 Unspecified atrial fibrillation: Secondary | ICD-10-CM | POA: Diagnosis not present

## 2024-11-18 DIAGNOSIS — I4819 Other persistent atrial fibrillation: Secondary | ICD-10-CM

## 2024-11-18 DIAGNOSIS — D6869 Other thrombophilia: Secondary | ICD-10-CM

## 2024-11-18 DIAGNOSIS — Z7901 Long term (current) use of anticoagulants: Secondary | ICD-10-CM

## 2024-11-18 LAB — POCT INR: INR: 1.9 — AB (ref 2.0–3.0)

## 2024-11-20 ENCOUNTER — Telehealth: Payer: Self-pay

## 2024-11-20 DIAGNOSIS — I5022 Chronic systolic (congestive) heart failure: Secondary | ICD-10-CM

## 2024-11-20 MED ORDER — SACUBITRIL-VALSARTAN 24-26 MG PO TABS: 1.0000 | ORAL_TABLET | Freq: Two times a day (BID) | ORAL | 0 refills | Status: AC

## 2024-11-20 NOTE — Telephone Encounter (Signed)
 Medication has been sent.  Copied from CRM #[id number]. Topic: Clinical - Medication Refill >> Nov 20, 2024  1:18 PM Drema MATSU wrote: Medication: sacubitril -valsartan  (ENTRESTO ) 24-26 MG  Has the patient contacted their pharmacy? Yes (Agent: If no, request that the patient contact the pharmacy for the refill. If patient does not wish to contact the pharmacy document the reason why and proceed with request.) need new prescription (Agent: If yes, when and what did the pharmacy advise?)  This is the patient's preferred pharmacy:  [facility] - [place], [place] - [address] [facility] [address] [place] [address] Phone: [phone] Fax: [phone]  Is this the correct pharmacy for this prescription? Yes If no, delete pharmacy and type the correct one.   Has the prescription been filled recently? Yes  Is the patient out of the medication? Yes  Has the patient been seen for an appointment in the last year OR does the patient have an upcoming appointment? Yes  Can we respond through MyChart? No  Agent: Please be advised that Rx refills may take up to 3 business days. We ask that you follow-up with your pharmacy.

## 2024-11-28 ENCOUNTER — Ambulatory Visit: Admitting: Pulmonary Disease

## 2024-12-03 ENCOUNTER — Ambulatory Visit

## 2024-12-05 ENCOUNTER — Encounter: Admitting: Dietician

## 2024-12-10 ENCOUNTER — Ambulatory Visit: Admitting: Neurology

## 2024-12-24 ENCOUNTER — Ambulatory Visit: Admitting: Internal Medicine

## 2025-03-12 ENCOUNTER — Ambulatory Visit
